# Patient Record
Sex: Female | Born: 1955 | Race: Black or African American | Hispanic: No | Smoking: Former smoker
Health system: Southern US, Community
[De-identification: ages and names within clinical notes are randomized; demographics above are authoritative.]

## PROBLEM LIST (undated history)

## (undated) DIAGNOSIS — M109 Gout, unspecified: Secondary | ICD-10-CM

## (undated) DIAGNOSIS — E7800 Pure hypercholesterolemia, unspecified: Secondary | ICD-10-CM

## (undated) DIAGNOSIS — I10 Essential (primary) hypertension: Secondary | ICD-10-CM

## (undated) DIAGNOSIS — M1711 Unilateral primary osteoarthritis, right knee: Secondary | ICD-10-CM

## (undated) DIAGNOSIS — N183 Chronic kidney disease, stage 3 unspecified: Secondary | ICD-10-CM

## (undated) DIAGNOSIS — M1712 Unilateral primary osteoarthritis, left knee: Secondary | ICD-10-CM

## (undated) DIAGNOSIS — A048 Other specified bacterial intestinal infections: Secondary | ICD-10-CM

## (undated) DIAGNOSIS — J449 Chronic obstructive pulmonary disease, unspecified: Secondary | ICD-10-CM

## (undated) DIAGNOSIS — G4733 Obstructive sleep apnea (adult) (pediatric): Secondary | ICD-10-CM

## (undated) DIAGNOSIS — M170 Bilateral primary osteoarthritis of knee: Secondary | ICD-10-CM

## (undated) DIAGNOSIS — I5042 Chronic combined systolic (congestive) and diastolic (congestive) heart failure: Secondary | ICD-10-CM

## (undated) DIAGNOSIS — I639 Cerebral infarction, unspecified: Secondary | ICD-10-CM

## (undated) DIAGNOSIS — Z87891 Personal history of nicotine dependence: Secondary | ICD-10-CM

## (undated) DIAGNOSIS — M069 Rheumatoid arthritis, unspecified: Secondary | ICD-10-CM

## (undated) DIAGNOSIS — I272 Pulmonary hypertension, unspecified: Secondary | ICD-10-CM

## (undated) DIAGNOSIS — J45909 Unspecified asthma, uncomplicated: Secondary | ICD-10-CM

## (undated) DIAGNOSIS — I4819 Other persistent atrial fibrillation: Secondary | ICD-10-CM

## (undated) DIAGNOSIS — I428 Other cardiomyopathies: Secondary | ICD-10-CM

## (undated) DIAGNOSIS — J189 Pneumonia, unspecified organism: Secondary | ICD-10-CM

## (undated) HISTORY — DX: Unilateral primary osteoarthritis, left knee: M17.12

## (undated) HISTORY — DX: Personal history of nicotine dependence: Z87.891

## (undated) HISTORY — DX: Pure hypercholesterolemia, unspecified: E78.00

## (undated) HISTORY — PX: DOPPLER ECHOCARDIOGRAPHY: SHX263

## (undated) HISTORY — PX: EYE SURGERY: SHX253

## (undated) HISTORY — DX: Bilateral primary osteoarthritis of knee: M17.0

## (undated) HISTORY — PX: OTHER SURGICAL HISTORY: SHX169

## (undated) HISTORY — DX: Unilateral primary osteoarthritis, right knee: M17.11

## (undated) HISTORY — PX: CORNEAL TRANSPLANT: SHX108

---

## 1980-10-11 HISTORY — PX: TUBAL LIGATION: SHX77

## 1999-01-29 ENCOUNTER — Ambulatory Visit (HOSPITAL_COMMUNITY): Admission: RE | Admit: 1999-01-29 | Discharge: 1999-01-29 | Payer: Self-pay | Admitting: Pulmonary Disease

## 1999-02-17 ENCOUNTER — Encounter: Admission: RE | Admit: 1999-02-17 | Discharge: 1999-02-17 | Payer: Self-pay | Admitting: Obstetrics & Gynecology

## 2002-04-25 ENCOUNTER — Emergency Department (HOSPITAL_COMMUNITY): Admission: EM | Admit: 2002-04-25 | Discharge: 2002-04-25 | Payer: Self-pay | Admitting: Emergency Medicine

## 2002-06-14 ENCOUNTER — Encounter: Admission: RE | Admit: 2002-06-14 | Discharge: 2002-06-14 | Payer: Self-pay | Admitting: Internal Medicine

## 2002-07-17 ENCOUNTER — Encounter: Admission: RE | Admit: 2002-07-17 | Discharge: 2002-07-17 | Payer: Self-pay | Admitting: Internal Medicine

## 2003-02-14 ENCOUNTER — Encounter: Admission: RE | Admit: 2003-02-14 | Discharge: 2003-02-14 | Payer: Self-pay | Admitting: Internal Medicine

## 2003-03-14 ENCOUNTER — Encounter: Admission: RE | Admit: 2003-03-14 | Discharge: 2003-03-14 | Payer: Self-pay | Admitting: Internal Medicine

## 2003-07-04 ENCOUNTER — Other Ambulatory Visit: Admission: RE | Admit: 2003-07-04 | Discharge: 2003-07-04 | Payer: Self-pay | Admitting: Gynecology

## 2003-09-12 ENCOUNTER — Encounter: Admission: RE | Admit: 2003-09-12 | Discharge: 2003-09-12 | Payer: Self-pay | Admitting: Internal Medicine

## 2003-09-17 ENCOUNTER — Encounter: Admission: RE | Admit: 2003-09-17 | Discharge: 2003-09-17 | Payer: Self-pay | Admitting: Internal Medicine

## 2003-10-01 ENCOUNTER — Encounter: Admission: RE | Admit: 2003-10-01 | Discharge: 2003-10-01 | Payer: Self-pay | Admitting: Internal Medicine

## 2003-10-09 ENCOUNTER — Encounter: Admission: RE | Admit: 2003-10-09 | Discharge: 2003-10-09 | Payer: Self-pay | Admitting: Internal Medicine

## 2004-12-01 ENCOUNTER — Ambulatory Visit: Payer: Self-pay | Admitting: Internal Medicine

## 2004-12-17 ENCOUNTER — Ambulatory Visit: Payer: Self-pay | Admitting: Internal Medicine

## 2004-12-17 ENCOUNTER — Ambulatory Visit (HOSPITAL_COMMUNITY): Admission: RE | Admit: 2004-12-17 | Discharge: 2004-12-17 | Payer: Self-pay | Admitting: Internal Medicine

## 2004-12-31 ENCOUNTER — Ambulatory Visit: Payer: Self-pay | Admitting: Internal Medicine

## 2005-01-01 ENCOUNTER — Ambulatory Visit (HOSPITAL_COMMUNITY): Admission: RE | Admit: 2005-01-01 | Discharge: 2005-01-01 | Payer: Self-pay | Admitting: Internal Medicine

## 2005-03-24 ENCOUNTER — Ambulatory Visit: Payer: Self-pay | Admitting: Internal Medicine

## 2005-04-06 ENCOUNTER — Other Ambulatory Visit: Admission: RE | Admit: 2005-04-06 | Discharge: 2005-04-06 | Payer: Self-pay | Admitting: Gynecology

## 2005-06-08 ENCOUNTER — Emergency Department (HOSPITAL_COMMUNITY): Admission: EM | Admit: 2005-06-08 | Discharge: 2005-06-08 | Payer: Self-pay | Admitting: Emergency Medicine

## 2005-07-21 ENCOUNTER — Emergency Department (HOSPITAL_COMMUNITY): Admission: EM | Admit: 2005-07-21 | Discharge: 2005-07-21 | Payer: Self-pay | Admitting: Emergency Medicine

## 2005-09-13 ENCOUNTER — Encounter: Admission: RE | Admit: 2005-09-13 | Discharge: 2005-09-13 | Payer: Self-pay | Admitting: Internal Medicine

## 2005-10-11 HISTORY — PX: ANGIOGRAM/LV (CONGENITAL): SHX6166

## 2005-11-04 ENCOUNTER — Ambulatory Visit (HOSPITAL_COMMUNITY): Admission: RE | Admit: 2005-11-04 | Discharge: 2005-11-04 | Payer: Self-pay | Admitting: Cardiology

## 2005-11-04 HISTORY — PX: CORONARY ANGIOPLASTY WITH STENT PLACEMENT: SHX49

## 2006-07-28 ENCOUNTER — Other Ambulatory Visit: Admission: RE | Admit: 2006-07-28 | Discharge: 2006-07-28 | Payer: Self-pay | Admitting: Gynecology

## 2006-10-17 DIAGNOSIS — N949 Unspecified condition associated with female genital organs and menstrual cycle: Secondary | ICD-10-CM | POA: Insufficient documentation

## 2006-10-17 DIAGNOSIS — I517 Cardiomegaly: Secondary | ICD-10-CM | POA: Insufficient documentation

## 2006-10-17 DIAGNOSIS — I10 Essential (primary) hypertension: Secondary | ICD-10-CM

## 2006-10-17 DIAGNOSIS — E785 Hyperlipidemia, unspecified: Secondary | ICD-10-CM | POA: Insufficient documentation

## 2006-10-20 ENCOUNTER — Ambulatory Visit (HOSPITAL_COMMUNITY): Admission: RE | Admit: 2006-10-20 | Discharge: 2006-10-20 | Payer: Self-pay | Admitting: Cardiology

## 2006-10-20 ENCOUNTER — Encounter (INDEPENDENT_AMBULATORY_CARE_PROVIDER_SITE_OTHER): Payer: Self-pay | Admitting: Cardiology

## 2006-12-25 ENCOUNTER — Emergency Department (HOSPITAL_COMMUNITY): Admission: EM | Admit: 2006-12-25 | Discharge: 2006-12-25 | Payer: Self-pay | Admitting: *Deleted

## 2007-03-11 ENCOUNTER — Emergency Department (HOSPITAL_COMMUNITY): Admission: EM | Admit: 2007-03-11 | Discharge: 2007-03-11 | Payer: Self-pay | Admitting: Emergency Medicine

## 2007-11-27 ENCOUNTER — Other Ambulatory Visit: Admission: RE | Admit: 2007-11-27 | Discharge: 2007-11-27 | Payer: Self-pay | Admitting: Gynecology

## 2008-06-09 ENCOUNTER — Emergency Department (HOSPITAL_COMMUNITY): Admission: EM | Admit: 2008-06-09 | Discharge: 2008-06-09 | Payer: Self-pay | Admitting: Emergency Medicine

## 2009-03-13 ENCOUNTER — Emergency Department (HOSPITAL_COMMUNITY): Admission: EM | Admit: 2009-03-13 | Discharge: 2009-03-13 | Payer: Self-pay | Admitting: Emergency Medicine

## 2009-06-24 ENCOUNTER — Ambulatory Visit: Payer: Self-pay | Admitting: Vascular Surgery

## 2009-06-24 ENCOUNTER — Ambulatory Visit: Payer: Self-pay | Admitting: Internal Medicine

## 2009-06-24 ENCOUNTER — Inpatient Hospital Stay (HOSPITAL_COMMUNITY): Admission: EM | Admit: 2009-06-24 | Discharge: 2009-06-27 | Payer: Self-pay | Admitting: Emergency Medicine

## 2009-06-24 ENCOUNTER — Encounter (INDEPENDENT_AMBULATORY_CARE_PROVIDER_SITE_OTHER): Payer: Self-pay | Admitting: Cardiovascular Disease

## 2009-06-25 ENCOUNTER — Encounter (INDEPENDENT_AMBULATORY_CARE_PROVIDER_SITE_OTHER): Payer: Self-pay | Admitting: Cardiovascular Disease

## 2009-06-26 ENCOUNTER — Ambulatory Visit: Payer: Self-pay | Admitting: Pulmonary Disease

## 2009-06-27 ENCOUNTER — Encounter: Payer: Self-pay | Admitting: Internal Medicine

## 2009-07-02 ENCOUNTER — Encounter: Payer: Self-pay | Admitting: Internal Medicine

## 2009-10-02 ENCOUNTER — Inpatient Hospital Stay (HOSPITAL_COMMUNITY): Admission: EM | Admit: 2009-10-02 | Discharge: 2009-10-03 | Payer: Self-pay | Admitting: Emergency Medicine

## 2009-10-11 HISTORY — PX: OTHER SURGICAL HISTORY: SHX169

## 2009-11-16 ENCOUNTER — Inpatient Hospital Stay (HOSPITAL_COMMUNITY): Admission: EM | Admit: 2009-11-16 | Discharge: 2009-11-18 | Payer: Self-pay | Admitting: Emergency Medicine

## 2009-12-01 ENCOUNTER — Encounter: Payer: Self-pay | Admitting: Internal Medicine

## 2009-12-09 ENCOUNTER — Encounter: Payer: Self-pay | Admitting: Internal Medicine

## 2009-12-11 ENCOUNTER — Encounter: Payer: Self-pay | Admitting: Internal Medicine

## 2009-12-19 ENCOUNTER — Encounter: Payer: Self-pay | Admitting: Internal Medicine

## 2009-12-26 ENCOUNTER — Encounter: Payer: Self-pay | Admitting: Internal Medicine

## 2010-01-09 DIAGNOSIS — I480 Paroxysmal atrial fibrillation: Secondary | ICD-10-CM

## 2010-01-16 ENCOUNTER — Encounter: Payer: Self-pay | Admitting: Internal Medicine

## 2010-02-03 ENCOUNTER — Encounter (INDEPENDENT_AMBULATORY_CARE_PROVIDER_SITE_OTHER): Payer: Self-pay | Admitting: *Deleted

## 2010-10-14 ENCOUNTER — Emergency Department (HOSPITAL_COMMUNITY)
Admission: EM | Admit: 2010-10-14 | Discharge: 2010-10-15 | Payer: Self-pay | Source: Home / Self Care | Admitting: Emergency Medicine

## 2010-10-14 LAB — BASIC METABOLIC PANEL
BUN: 10 mg/dL (ref 6–23)
CO2: 32 mEq/L (ref 19–32)
Calcium: 9 mg/dL (ref 8.4–10.5)
Chloride: 97 mEq/L (ref 96–112)
Creatinine, Ser: 1 mg/dL (ref 0.4–1.2)
GFR calc Af Amer: >60 mL/min (ref >60)
GFR calc non Af Amer: 58 mL/min — ABNORMAL LOW (ref >60)
Glucose, Bld: 135 mg/dL — ABNORMAL HIGH (ref 70–99)
Potassium: 3.1 mEq/L — ABNORMAL LOW (ref 3.5–5.1)
Sodium: 142 mEq/L (ref 135–145)

## 2010-10-14 LAB — CBC
HCT: 36.6 % (ref 36.0–46.0)
Hemoglobin: 11.7 g/dL — ABNORMAL LOW (ref 12.0–15.0)
MCH: 28.3 pg (ref 26.0–34.0)
MCHC: 32 g/dL (ref 30.0–36.0)
MCV: 88.4 fL (ref 78.0–100.0)
Platelets: 265 10*3/uL (ref 150–400)
RBC: 4.14 MIL/uL (ref 3.87–5.11)
RDW: 14.5 % (ref 11.5–15.5)
WBC: 8.6 10*3/uL (ref 4.0–10.5)

## 2010-10-14 LAB — DIFFERENTIAL
Basophils Absolute: 0 10*3/uL (ref 0.0–0.1)
Basophils Relative: 0 % (ref 0–1)
Eosinophils Absolute: 0.1 10*3/uL (ref 0.0–0.7)
Eosinophils Relative: 1 % (ref 0–5)
Lymphocytes Relative: 18 % (ref 12–46)
Lymphs Abs: 1.5 10*3/uL (ref 0.7–4.0)
Monocytes Absolute: 0.7 10*3/uL (ref 0.1–1.0)
Monocytes Relative: 8 % (ref 3–12)
Neutro Abs: 6.3 10*3/uL (ref 1.7–7.7)
Neutrophils Relative %: 73 % (ref 43–77)

## 2010-10-15 ENCOUNTER — Other Ambulatory Visit: Payer: Self-pay | Admitting: Emergency Medicine

## 2010-10-15 LAB — SEDIMENTATION RATE: Sed Rate: 25 mm/hr — ABNORMAL HIGH (ref 0–22)

## 2010-10-18 ENCOUNTER — Emergency Department (HOSPITAL_COMMUNITY)
Admission: EM | Admit: 2010-10-18 | Discharge: 2010-10-18 | Payer: Self-pay | Source: Home / Self Care | Admitting: Emergency Medicine

## 2010-10-26 LAB — CBC
HCT: 36.6 % (ref 36.0–46.0)
Hemoglobin: 11.7 g/dL — ABNORMAL LOW (ref 12.0–15.0)
MCH: 28.1 pg (ref 26.0–34.0)
MCHC: 32 g/dL (ref 30.0–36.0)
MCV: 88 fL (ref 78.0–100.0)
Platelets: 230 10*3/uL (ref 150–400)
RBC: 4.16 MIL/uL (ref 3.87–5.11)
RDW: 14.3 % (ref 11.5–15.5)
WBC: 8.9 10*3/uL (ref 4.0–10.5)

## 2010-10-26 LAB — URIC ACID: Uric Acid, Serum: 9.4 mg/dL — ABNORMAL HIGH (ref 2.4–7.0)

## 2010-10-26 LAB — DIFFERENTIAL
Basophils Absolute: 0 10*3/uL (ref 0.0–0.1)
Basophils Relative: 0 % (ref 0–1)
Eosinophils Absolute: 0.1 K/uL (ref 0.0–0.7)
Eosinophils Relative: 2 % (ref 0–5)
Lymphocytes Relative: 17 % (ref 12–46)
Lymphs Abs: 1.5 10*3/uL (ref 0.7–4.0)
Monocytes Absolute: 0.6 10*3/uL (ref 0.1–1.0)
Monocytes Relative: 7 % (ref 3–12)
Neutro Abs: 6.6 10*3/uL (ref 1.7–7.7)
Neutrophils Relative %: 75 % (ref 43–77)

## 2010-11-12 NOTE — Letter (Signed)
Summary: [facility] Office Note Addendum   [facility] Office Note Addendum   Imported By: Roderic Ovens 02/02/2010 13:35:23  _____________________________________________________________________  External Attachment:    Type:   Image     Comment:   External Document

## 2010-11-12 NOTE — Letter (Signed)
Summary: Appointment - Missed  [facility], Main Office  [address]   [place], [place] [address]   Phone: [phone]  Fax: [phone]     February 03, 2010 MRN: [id number]   Paige Marshall [address] [place], [place]  [address]   Dear Ms. Nordgren,  Our records indicate you missed your appointment on 01/09/10  with Dr. Ladona Ridgel.It is very important that we reach you to reschedule this appointment. We look forward to participating in your health care needs. Please contact us at the number listed above at your earliest convenience to reschedule this appointment.     Sincerely,   Ruel Favors Scheduling Team

## 2010-11-12 NOTE — Letter (Signed)
Summary: [facility] Office Note   [facility] Office Note   Imported By: Roderic Ovens 02/02/2010 13:53:32  _____________________________________________________________________  External Attachment:    Type:   Image     Comment:   External Document

## 2010-11-12 NOTE — Progress Notes (Signed)
Summary: Med List   Med List   Imported By: Roderic Ovens 02/02/2010 13:36:48  _____________________________________________________________________  External Attachment:    Type:   Image     Comment:   External Document

## 2010-11-12 NOTE — Letter (Signed)
Summary: [facility] Office Note   [facility] Office Note   Imported By: Roderic Ovens 02/02/2010 13:25:29  _____________________________________________________________________  External Attachment:    Type:   Image     Comment:   External Document

## 2010-11-12 NOTE — Letter (Signed)
Summary: [facility] Office Note   [facility] Office Note   Imported By: Roderic Ovens 02/02/2010 13:29:31  _____________________________________________________________________  External Attachment:    Type:   Image     Comment:   External Document

## 2010-12-30 LAB — BASIC METABOLIC PANEL WITH GFR
BUN: 24 mg/dL — ABNORMAL HIGH (ref 6–23)
BUN: 24 mg/dL — ABNORMAL HIGH (ref 6–23)
BUN: 25 mg/dL — ABNORMAL HIGH (ref 6–23)
CO2: 30 meq/L (ref 19–32)
CO2: 32 meq/L (ref 19–32)
CO2: 32 meq/L (ref 19–32)
Calcium: 8.8 mg/dL (ref 8.4–10.5)
Calcium: 8.8 mg/dL (ref 8.4–10.5)
Calcium: 8.9 mg/dL (ref 8.4–10.5)
Chloride: 96 meq/L (ref 96–112)
Chloride: 97 meq/L (ref 96–112)
Chloride: 99 meq/L (ref 96–112)
Creatinine, Ser: 0.95 mg/dL (ref 0.4–1.2)
Creatinine, Ser: 1 mg/dL (ref 0.4–1.2)
Creatinine, Ser: 1.24 mg/dL — ABNORMAL HIGH (ref 0.4–1.2)
GFR calc non Af Amer: 46 mL/min — ABNORMAL LOW
GFR calc non Af Amer: 58 mL/min — ABNORMAL LOW
GFR calc non Af Amer: >60 mL/min
Glucose, Bld: 115 mg/dL — ABNORMAL HIGH (ref 70–99)
Glucose, Bld: 121 mg/dL — ABNORMAL HIGH (ref 70–99)
Glucose, Bld: 98 mg/dL (ref 70–99)
Potassium: 2.8 meq/L — ABNORMAL LOW (ref 3.5–5.1)
Potassium: 4 meq/L (ref 3.5–5.1)
Potassium: 4 meq/L (ref 3.5–5.1)
Sodium: 137 meq/L (ref 135–145)
Sodium: 139 meq/L (ref 135–145)
Sodium: 139 meq/L (ref 135–145)

## 2010-12-30 LAB — URINALYSIS, MICROSCOPIC ONLY
Glucose, UA: NEGATIVE mg/dL
Hgb urine dipstick: NEGATIVE
Ketones, ur: NEGATIVE mg/dL
Leukocytes, UA: NEGATIVE
Nitrite: NEGATIVE
Protein, ur: NEGATIVE mg/dL
Specific Gravity, Urine: 1.019 (ref 1.005–1.030)
Urobilinogen, UA: 1 mg/dL (ref 0.0–1.0)
pH: 5 (ref 5.0–8.0)

## 2010-12-30 LAB — BASIC METABOLIC PANEL
Calcium: 9.4 mg/dL (ref 8.4–10.5)
GFR calc Af Amer: 59 mL/min — ABNORMAL LOW (ref >60)
GFR calc non Af Amer: 49 mL/min — ABNORMAL LOW (ref >60)
Sodium: 143 mEq/L (ref 135–145)

## 2010-12-30 LAB — BRAIN NATRIURETIC PEPTIDE: Pro B Natriuretic peptide (BNP): 218 pg/mL — ABNORMAL HIGH (ref 0.0–100.0)

## 2010-12-30 LAB — COMPREHENSIVE METABOLIC PANEL WITH GFR
ALT: 17 U/L (ref 0–35)
AST: 19 U/L (ref 0–37)
Albumin: 3.7 g/dL (ref 3.5–5.2)
Alkaline Phosphatase: 49 U/L (ref 39–117)
BUN: 27 mg/dL — ABNORMAL HIGH (ref 6–23)
CO2: 31 meq/L (ref 19–32)
Calcium: 9.3 mg/dL (ref 8.4–10.5)
Chloride: 95 meq/L — ABNORMAL LOW (ref 96–112)
Creatinine, Ser: 1.34 mg/dL — ABNORMAL HIGH (ref 0.4–1.2)
GFR calc non Af Amer: 42 mL/min — ABNORMAL LOW
Glucose, Bld: 160 mg/dL — ABNORMAL HIGH (ref 70–99)
Potassium: 3.1 meq/L — ABNORMAL LOW (ref 3.5–5.1)
Sodium: 138 meq/L (ref 135–145)
Total Bilirubin: 0.5 mg/dL (ref 0.3–1.2)
Total Protein: 6.8 g/dL (ref 6.0–8.3)

## 2010-12-30 LAB — CBC
HCT: 30.9 % — ABNORMAL LOW (ref 36.0–46.0)
HCT: 33.3 % — ABNORMAL LOW (ref 36.0–46.0)
HCT: 37.2 % (ref 36.0–46.0)
Hemoglobin: 10.6 g/dL — ABNORMAL LOW (ref 12.0–15.0)
Hemoglobin: 11.4 g/dL — ABNORMAL LOW (ref 12.0–15.0)
Hemoglobin: 15.2 g/dL — ABNORMAL HIGH (ref 12.0–15.0)
MCHC: 34.2 g/dL (ref 30.0–36.0)
MCHC: 34.2 g/dL (ref 30.0–36.0)
MCV: 86.9 fL (ref 78.0–100.0)
MCV: 88.4 fL (ref 78.0–100.0)
Platelets: 189 K/uL (ref 150–400)
Platelets: 208 K/uL (ref 150–400)
Platelets: 272 10*3/uL (ref 150–400)
RBC: 3.55 MIL/uL — ABNORMAL LOW (ref 3.87–5.11)
RBC: 3.77 MIL/uL — ABNORMAL LOW (ref 3.87–5.11)
RBC: 4.23 MIL/uL (ref 3.87–5.11)
RBC: 5.12 MIL/uL — ABNORMAL HIGH (ref 3.87–5.11)
RDW: 16.6 % — ABNORMAL HIGH (ref 11.5–15.5)
RDW: 17.2 % — ABNORMAL HIGH (ref 11.5–15.5)
WBC: 12.3 10*3/uL — ABNORMAL HIGH (ref 4.0–10.5)
WBC: 14.5 10*3/uL — ABNORMAL HIGH (ref 4.0–10.5)
WBC: 7.4 K/uL (ref 4.0–10.5)
WBC: 7.7 K/uL (ref 4.0–10.5)

## 2010-12-30 LAB — RAPID URINE DRUG SCREEN, HOSP PERFORMED
Amphetamines: NOT DETECTED
Benzodiazepines: NOT DETECTED

## 2010-12-30 LAB — CARDIAC PANEL(CRET KIN+CKTOT+MB+TROPI)
CK, MB: 1.5 ng/mL (ref 0.3–4.0)
CK, MB: 1.5 ng/mL (ref 0.3–4.0)
Relative Index: INVALID (ref 0.0–2.5)
Relative Index: INVALID (ref 0.0–2.5)
Total CK: 56 U/L (ref 7–177)
Total CK: 60 U/L (ref 7–177)

## 2010-12-30 LAB — POCT CARDIAC MARKERS
CKMB, poc: 1.8 ng/mL (ref 1.0–8.0)
Myoglobin, poc: 91.7 ng/mL (ref 12–200)
Troponin i, poc: <0.05 ng/mL (ref 0.00–0.09)

## 2010-12-30 LAB — HEPARIN LEVEL (UNFRACTIONATED)

## 2010-12-30 LAB — DIFFERENTIAL
Basophils Absolute: 0 10*3/uL (ref 0.0–0.1)
Basophils Relative: 0 % (ref 0–1)
Lymphocytes Relative: 15 % (ref 12–46)
Neutro Abs: 9.7 10*3/uL — ABNORMAL HIGH (ref 1.7–7.7)
Neutrophils Relative %: 79 % — ABNORMAL HIGH (ref 43–77)

## 2010-12-30 LAB — CK TOTAL AND CKMB (NOT AT ARMC): CK, MB: 2.1 ng/mL (ref 0.3–4.0)

## 2010-12-30 LAB — TSH: TSH: 1.043 u[IU]/mL (ref 0.350–4.500)

## 2010-12-30 LAB — MAGNESIUM: Magnesium: 2.7 mg/dL — ABNORMAL HIGH (ref 1.5–2.5)

## 2011-01-11 LAB — CARDIAC PANEL(CRET KIN+CKTOT+MB+TROPI)
CK, MB: 0.9 ng/mL (ref 0.3–4.0)
Relative Index: INVALID (ref 0.0–2.5)
Relative Index: INVALID (ref 0.0–2.5)
Total CK: 80 U/L (ref 7–177)
Total CK: 83 U/L (ref 7–177)
Troponin I: 0.01 ng/mL (ref 0.00–0.06)
Troponin I: 0.01 ng/mL (ref 0.00–0.06)

## 2011-01-11 LAB — CBC
MCHC: 34.9 g/dL (ref 30.0–36.0)
MCV: 85.7 fL (ref 78.0–100.0)
Platelets: 201 10*3/uL (ref 150–400)
RDW: 16.3 % — ABNORMAL HIGH (ref 11.5–15.5)
WBC: 4.7 10*3/uL (ref 4.0–10.5)

## 2011-01-11 LAB — BASIC METABOLIC PANEL
BUN: 20 mg/dL (ref 6–23)
BUN: 28 mg/dL — ABNORMAL HIGH (ref 6–23)
CO2: 29 mEq/L (ref 19–32)
CO2: 31 mEq/L (ref 19–32)
Calcium: 8.9 mg/dL (ref 8.4–10.5)
Chloride: 97 mEq/L (ref 96–112)
Chloride: 98 mEq/L (ref 96–112)
Creatinine, Ser: 0.72 mg/dL (ref 0.4–1.2)
Creatinine, Ser: 1.03 mg/dL (ref 0.4–1.2)
GFR calc Af Amer: >60 mL/min (ref >60)
GFR calc non Af Amer: 56 mL/min — ABNORMAL LOW (ref >60)
Glucose, Bld: 113 mg/dL — ABNORMAL HIGH (ref 70–99)
Glucose, Bld: 211 mg/dL — ABNORMAL HIGH (ref 70–99)
Potassium: 3.4 mEq/L — ABNORMAL LOW (ref 3.5–5.1)
Sodium: 138 mEq/L (ref 135–145)

## 2011-01-11 LAB — HEMOGLOBIN A1C
Hgb A1c MFr Bld: 5.6 % (ref 4.6–6.1)
Mean Plasma Glucose: 114 mg/dL

## 2011-01-11 LAB — BRAIN NATRIURETIC PEPTIDE: Pro B Natriuretic peptide (BNP): 53 pg/mL (ref 0.0–100.0)

## 2011-01-11 LAB — TROPONIN I: Troponin I: 0.01 ng/mL (ref 0.00–0.06)

## 2011-01-11 LAB — DIFFERENTIAL
Basophils Absolute: 0 10*3/uL (ref 0.0–0.1)
Basophils Relative: 0 % (ref 0–1)
Eosinophils Absolute: 0.1 10*3/uL (ref 0.0–0.7)
Neutrophils Relative %: 66 % (ref 43–77)

## 2011-01-11 LAB — MAGNESIUM: Magnesium: 2.1 mg/dL (ref 1.5–2.5)

## 2011-01-11 LAB — POCT CARDIAC MARKERS: Myoglobin, poc: 40.2 ng/mL (ref 12–200)

## 2011-01-11 LAB — LIPID PANEL
Cholesterol: 191 mg/dL (ref 0–200)
LDL Cholesterol: 94 mg/dL (ref 0–99)
Total CHOL/HDL Ratio: 3 RATIO

## 2011-01-11 LAB — CK TOTAL AND CKMB (NOT AT ARMC)
CK, MB: 0.9 ng/mL (ref 0.3–4.0)
Relative Index: INVALID (ref 0.0–2.5)
Total CK: 89 U/L (ref 7–177)

## 2011-01-15 LAB — CBC
HCT: 32 % — ABNORMAL LOW (ref 36.0–46.0)
HCT: 32.3 % — ABNORMAL LOW (ref 36.0–46.0)
Hemoglobin: 9.9 g/dL — ABNORMAL LOW (ref 12.0–15.0)
MCHC: 33.3 g/dL (ref 30.0–36.0)
MCHC: 33.8 g/dL (ref 30.0–36.0)
MCHC: 33.2 g/dL (ref 30.0–36.0)
MCHC: 34.6 g/dL (ref 30.0–36.0)
MCV: 86.4 fL (ref 78.0–100.0)
MCV: 86.4 fL (ref 78.0–100.0)
MCV: 85.3 fL (ref 78.0–100.0)
Platelets: 224 10*3/uL (ref 150–400)
Platelets: 254 10*3/uL (ref 150–400)
RBC: 3.33 MIL/uL — ABNORMAL LOW (ref 3.87–5.11)
RBC: 3.42 MIL/uL — ABNORMAL LOW (ref 3.87–5.11)
RDW: 16.5 % — ABNORMAL HIGH (ref 11.5–15.5)
RDW: 17.1 % — ABNORMAL HIGH (ref 11.5–15.5)
RDW: 16 % — ABNORMAL HIGH (ref 11.5–15.5)
RDW: 16.4 % — ABNORMAL HIGH (ref 11.5–15.5)

## 2011-01-15 LAB — CLOTEST (H. PYLORI), BIOPSY: Helicobacter screen: POSITIVE — AB

## 2011-01-15 LAB — COMPREHENSIVE METABOLIC PANEL
Albumin: 3.8 g/dL (ref 3.5–5.2)
BUN: 11 mg/dL (ref 6–23)
Calcium: 8.7 mg/dL (ref 8.4–10.5)
Creatinine, Ser: 0.93 mg/dL (ref 0.4–1.2)
Total Protein: 7 g/dL (ref 6.0–8.3)

## 2011-01-15 LAB — URINALYSIS, ROUTINE W REFLEX MICROSCOPIC
Hgb urine dipstick: NEGATIVE
Nitrite: NEGATIVE
Specific Gravity, Urine: 1.021 (ref 1.005–1.030)
pH: 5 (ref 5.0–8.0)

## 2011-01-15 LAB — PROTIME-INR: INR: 1 (ref 0.00–1.49)

## 2011-01-15 LAB — BASIC METABOLIC PANEL
BUN: 16 mg/dL (ref 6–23)
BUN: 13 mg/dL (ref 6–23)
Creatinine, Ser: 0.96 mg/dL (ref 0.4–1.2)
GFR calc non Af Amer: >60 mL/min (ref >60)
GFR calc non Af Amer: >60 mL/min (ref >60)
Glucose, Bld: 153 mg/dL — ABNORMAL HIGH (ref 70–99)
Glucose, Bld: 133 mg/dL — ABNORMAL HIGH (ref 70–99)
Potassium: 4.1 mEq/L (ref 3.5–5.1)

## 2011-01-15 LAB — URINE MICROSCOPIC-ADD ON

## 2011-01-15 LAB — D-DIMER, QUANTITATIVE: D-Dimer, Quant: <0.22 ug/mL-FEU (ref 0.00–0.48)

## 2011-01-15 LAB — HEMOGLOBIN A1C: Hgb A1c MFr Bld: 5.3 % (ref 4.6–6.1)

## 2011-01-15 LAB — IRON AND TIBC
TIBC: 322 ug/dL (ref 250–470)
UIBC: 265 ug/dL

## 2011-01-15 LAB — HEPARIN LEVEL (UNFRACTIONATED)
Heparin Unfractionated: 0.4 IU/mL (ref 0.30–0.70)
Heparin Unfractionated: 0.2 IU/mL — ABNORMAL LOW (ref 0.30–0.70)
Heparin Unfractionated: 0.39 IU/mL (ref 0.30–0.70)

## 2011-01-15 LAB — DIFFERENTIAL
Lymphocytes Relative: 21 % (ref 12–46)
Monocytes Absolute: 0.4 10*3/uL (ref 0.1–1.0)
Monocytes Relative: 6 % (ref 3–12)
Neutro Abs: 4.6 10*3/uL (ref 1.7–7.7)
Neutrophils Relative %: 71 % (ref 43–77)

## 2011-01-15 LAB — POCT CARDIAC MARKERS
CKMB, poc: <1 ng/mL — ABNORMAL LOW (ref 1.0–8.0)
Myoglobin, poc: 69.8 ng/mL (ref 12–200)
Troponin i, poc: <0.05 ng/mL (ref 0.00–0.09)
Troponin i, poc: <0.05 ng/mL (ref 0.00–0.09)

## 2011-01-15 LAB — FERRITIN: Ferritin: 246 ng/mL (ref 10–291)

## 2011-01-15 LAB — URINE CULTURE: Culture: NO GROWTH

## 2011-01-15 LAB — TSH: TSH: 0.741 u[IU]/mL (ref 0.350–4.500)

## 2011-01-15 LAB — VITAMIN B12: Vitamin B-12: 919 pg/mL — ABNORMAL HIGH (ref 211–911)

## 2011-01-15 LAB — APTT: aPTT: 85 seconds — ABNORMAL HIGH (ref 24–37)

## 2011-02-26 NOTE — Cardiovascular Report (Signed)
Paige Marshall, Paige Marshall NO.:  [id number]   MEDICAL RECORD NO.:  [id number]          PATIENT TYPE:  OIB   LOCATION:  2899                         FACILITY:  [facility]   PHYSICIAN:  Cristy Hilts. Jacinto Halim, MD       DATE OF BIRTH:  [birth year]   DATE OF PROCEDURE:  11/04/2005  DATE OF DISCHARGE:                              CARDIAC CATHETERIZATION   REFERRING PHYSICIAN:  Robyn N. Allyne Gee, M.D.   PROCEDURE PERFORMED:  1.  Left ventriculography.  2.  Saphenous vein graft coronary arteriography.  3.  Right heart catheterization.   INDICATIONS:  Paige Marshall is a 55 year old African American female with  history of difficult to control hypertension.  She had been complaining of  increasing shortness of breath.  She had an echocardiogram that had an EF of  around 22 to 30%.  Given her uncontrolled hypertension, and dilated  cardiomyopathy, she was brought to the cardiac catheterization lab to  evaluate for the cause of her cardiomyopathy.  Abdominal aortogram was  performed to evaluate for renal artery stenosis.   HEMODYNAMIC DATA:  The left ventricular pressure was 127/11 with the end  diastolic pressure of 19 mmHg.  The aortic pressure was 123/74 with a mean of 96 mmHg.   There was no pressure gradient across the aortic valve.   ANGIOGRAPHIC DATA:  LEFT VENTRICLE:  Left ventricular systolic function was  mild to moderately depressed with ejection fraction of 40%.  There was  moderate global hypokinesis.  The left ventricle was moderately dilated.  There was no significant mitral regurgitation appreciated, but however does  not well determine secondary to poor visualization.  (Correlation with  pulmonary capillary wedge, no evidence of significant MR).   RIGHT CORONARY:  Right coronary artery was seen as a dominant vessel.  It  only has bifurcation to PDA, PLA. It is normal.   LEFT MAIN:  Left main artery is a large caliber vessel and appears normal.   CIRCUMFLEX:  Circumflex  is a large caliber vessel and gives origin to  moderate size AV groove branch and continues to a large obtuse marginal 1.  It is normal.   LAD:  LAD is a large caliber vessels and gives origin to large diagonal 1  and moderate size  to diagonal 2.  It is wraps around the apex. It is  normal.   ABDOMINAL AORTOGRAM:  Abdominal aortogram reveals two renal arteries, one on  either side.  They are widely patent.  Diagonally EF bifurcation was widely  patent.   RIGHT HEART CATHETERIZATION HEMODYNAMIC DATA:  RA pressures were 19/17 with  a mean of 16 mmHg.   RV pressure of 48/14 with an end diastolic pressure of 17 mmHg.   PA pressure is 49/23 with a mean of 35 mmHg.   Pulmonary capillary wedge 35/32 with a mean of 32 mmHg.   PA saturation 60%.  FA saturation 90%.  Cardiac output was 5.1 with a  cardiac index of 2.1 by FICK.   IMPRESSION:  1.  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  2.  Moderate pulmonary hypertension secondary to morbid obesity and      cardiomyopathy contributing to this.   RECOMMENDATIONS:  Continue aggressive risk modification with weight loss,  aggressive control of hypertension and hyperlipidemia as indicated.   TECHNIQUE OF THE PROCEDURE:  Usual sterile precautions using a 6 French left  femoral arterial access and a 7 French right femoral  venous access, left  and right heart catheterization was respectively performed through these  sheaths.  A balloon tip Swan-Ganz catheter with a branch to the venous  sheath into the pulmonary artery.  Pulmonary capillary wedge was easily  obtained.  Right heart hemodynamics was carefully obtained.  The catheter  was then pulled apart in usual fashion.   A 6 [place] multipurpose B2 catheter was advanced into the ascending aorta  over a 0.025-inch J wire.  The catheter was gently advanced into the left  ventricle.  Left ventricular pressures  were monitored.  Hand contrast  injection of the left ventricle is performed.  Both in LAO and RAO  projection.  The catheter was flushed with saline and pulled back into the  ascending aorta and pressure gradient across the aortic valve was monitored.  The right coronary arteries selectively cannulated, angiography was  performed.  The catheter was pulled back into the abdominal aorta.  Abdominal aortogram was performed.  Then the 6 [place] Judkins JL4 diagnostic  catheter was utilized and engaged into left and right main coronary artery  angiography was performed.  Then the catheter was pulled out of body  in  usual fashion.  Right femoral angiography was performed to the arterial  access sheath  and the access was closed with star closure with excellent  hemostasis.   The patient tolerated the procedure well.  No complications noted.  A total  of 110 cc of contrast was utilized for diagnostic angiography.      Cristy Hilts. Jacinto Halim, MD  Electronically Signed     JRG/MEDQ  D:  11/04/2005  T:  11/04/2005  Job:  [id number]   cc:   Candyce Churn. Allyne Gee, M.D.  Fax: [phone]

## 2011-06-23 ENCOUNTER — Emergency Department (HOSPITAL_COMMUNITY): Payer: BC Managed Care – PPO

## 2011-06-23 ENCOUNTER — Emergency Department (HOSPITAL_COMMUNITY)
Admission: EM | Admit: 2011-06-23 | Discharge: 2011-06-23 | Disposition: A | Discharge status: Home / Self Care | Payer: BC Managed Care – PPO | Attending: Emergency Medicine | Admitting: Emergency Medicine

## 2011-06-23 DIAGNOSIS — M25429 Effusion, unspecified elbow: Secondary | ICD-10-CM | POA: Insufficient documentation

## 2011-06-23 DIAGNOSIS — M25529 Pain in unspecified elbow: Secondary | ICD-10-CM | POA: Insufficient documentation

## 2011-06-23 DIAGNOSIS — E669 Obesity, unspecified: Secondary | ICD-10-CM | POA: Insufficient documentation

## 2011-06-23 DIAGNOSIS — R262 Difficulty in walking, not elsewhere classified: Secondary | ICD-10-CM | POA: Insufficient documentation

## 2011-06-23 DIAGNOSIS — Z862 Personal history of diseases of the blood and blood-forming organs and certain disorders involving the immune mechanism: Secondary | ICD-10-CM | POA: Insufficient documentation

## 2011-06-23 DIAGNOSIS — Z79899 Other long term (current) drug therapy: Secondary | ICD-10-CM | POA: Insufficient documentation

## 2011-06-23 DIAGNOSIS — Z7982 Long term (current) use of aspirin: Secondary | ICD-10-CM | POA: Insufficient documentation

## 2011-06-23 DIAGNOSIS — M25569 Pain in unspecified knee: Secondary | ICD-10-CM | POA: Insufficient documentation

## 2011-06-23 DIAGNOSIS — I509 Heart failure, unspecified: Secondary | ICD-10-CM | POA: Insufficient documentation

## 2011-06-23 DIAGNOSIS — Z8639 Personal history of other endocrine, nutritional and metabolic disease: Secondary | ICD-10-CM | POA: Insufficient documentation

## 2011-06-23 DIAGNOSIS — I4891 Unspecified atrial fibrillation: Secondary | ICD-10-CM | POA: Insufficient documentation

## 2011-10-12 HISTORY — PX: OTHER SURGICAL HISTORY: SHX169

## 2011-12-08 ENCOUNTER — Emergency Department (HOSPITAL_COMMUNITY)
Admission: EM | Admit: 2011-12-08 | Discharge: 2011-12-08 | Disposition: A | Discharge status: Home / Self Care | Payer: BC Managed Care – PPO | Source: Home / Self Care | Attending: Emergency Medicine | Admitting: Emergency Medicine

## 2011-12-08 ENCOUNTER — Emergency Department (INDEPENDENT_AMBULATORY_CARE_PROVIDER_SITE_OTHER): Payer: BC Managed Care – PPO

## 2011-12-08 ENCOUNTER — Encounter (HOSPITAL_COMMUNITY): Payer: Self-pay | Admitting: *Deleted

## 2011-12-08 DIAGNOSIS — J40 Bronchitis, not specified as acute or chronic: Secondary | ICD-10-CM

## 2011-12-08 DIAGNOSIS — J45909 Unspecified asthma, uncomplicated: Secondary | ICD-10-CM

## 2011-12-08 HISTORY — DX: Essential (primary) hypertension: I10

## 2011-12-08 MED ORDER — ALBUTEROL SULFATE (5 MG/ML) 0.5% IN NEBU
INHALATION_SOLUTION | RESPIRATORY_TRACT | Status: AC
  Filled 2011-12-08: qty 1

## 2011-12-08 MED ORDER — HYDROCOD POLST-CHLORPHEN POLST 10-8 MG/5ML PO LQCR: 5.0000 mL | Freq: Two times a day (BID) | ORAL | Status: DC | PRN

## 2011-12-08 MED ORDER — ALBUTEROL SULFATE (5 MG/ML) 0.5% IN NEBU
5.0000 mg | INHALATION_SOLUTION | Freq: Once | RESPIRATORY_TRACT | Status: AC
  Administered 2011-12-08: 5 mg via RESPIRATORY_TRACT

## 2011-12-08 MED ORDER — METHYLPREDNISOLONE SODIUM SUCC 125 MG IJ SOLR
INTRAMUSCULAR | Status: AC
  Filled 2011-12-08: qty 2

## 2011-12-08 MED ORDER — IPRATROPIUM BROMIDE 0.02 % IN SOLN
0.5000 mg | Freq: Once | RESPIRATORY_TRACT | Status: AC
  Administered 2011-12-08: 0.5 mg via RESPIRATORY_TRACT

## 2011-12-08 MED ORDER — PREDNISONE 5 MG PO KIT: 1.0000 | PACK | Freq: Every day | ORAL | Status: DC

## 2011-12-08 MED ORDER — DOXYCYCLINE HYCLATE 100 MG PO TABS: 100.0000 mg | ORAL_TABLET | Freq: Two times a day (BID) | ORAL | Status: AC

## 2011-12-08 MED ORDER — METHYLPREDNISOLONE SODIUM SUCC 125 MG IJ SOLR
125.0000 mg | Freq: Once | INTRAMUSCULAR | Status: AC
  Administered 2011-12-08: 125 mg via INTRAMUSCULAR

## 2011-12-08 NOTE — ED Notes (Signed)
Pt states she's had a cough for 2 weeks, now with green mucus production.  Pt has hx of CHF, states she is sob and chest feels tight, but states this is not CHF, that she knows those symptoms.  She states she just needs some antibiotics.  Also states she has sinus congestion and has green nasal drainage.  Has been using Coricidin and Mucinex without relief.

## 2011-12-08 NOTE — ED Provider Notes (Signed)
Chief Complaint  Patient presents with  . Cough  . Shortness of Breath    History of Present Illness:   The patient has had a two-week history of a cough productive of green sputum and wheezing. She has a history of asthma which she usually controls with as needed albuterol. She also complains of nasal congestion and rhinorrhea which is sometimes green, and feels chilled. She denies any fever, sweats, sore throat, headache, nausea, vomiting, or diarrhea.  Review of Systems:  Other than noted above, the patient denies any of the following symptoms. Systemic:  No fever, chills, sweats, fatigue, myalgias, headache, or anorexia. Eye:  No redness, pain or drainage. ENT:  No earache, nasal congestion, rhinorrhea, sinus pressure, or sore throat. Lungs:  No cough, sputum production, wheezing, shortness of breath. Or chest pain. GI:  No nausea, vomiting, abdominal pain or diarrhea. Skin:  No rash or itching.  PMFSH:  Past medical history, family history, social history, meds, and allergies were reviewed.  Physical Exam:   Vital signs:  BP 180/85  Pulse 76  Temp(Src) 98.1 F (36.7 C) (Oral)  Resp 22  SpO2 93%  LMP 09/07/2011 General:  Alert, in no distress. Eye:  No conjunctival injection or drainage. ENT:  TMs and canals were normal, without erythema or inflammation.  Nasal mucosa was clear and uncongested, without drainage.  Mucous membranes were moist.  Pharynx was clear, without exudate or drainage.  There were no oral ulcerations or lesions. Neck:  Supple, no adenopathy, tenderness or mass. Lungs:  No respiratory distress.  She has scattered expiratory wheezes bilaterally both anteriorly and posteriorly. No rales or rhonchi. Rest sounds were somewhat diminished.  Breath sounds were otherwise clear and equal bilaterally. Heart:  Regular rhythm, without gallops, murmers or rubs. Skin:  Clear, warm, and dry, without rash or lesions.   Radiology:  Dg Chest 2 View  12/08/2011  *RADIOLOGY  REPORT*  Clinical Data: Cough, wheezing, shortness of breath.  CHEST - 2 VIEW  Comparison: 11/15/2009  Findings: Cardiomegaly.  No overt edema.  No effusions or focal airspace opacities.  Mild hyperinflation of the lungs.  No acute bony abnormality. Peribronchial thickening.  IMPRESSION: Cardiomegaly, hyperinflation.  Mild bronchitic changes.  Original Report Authenticated By: Cyndie Chime, M.D.   Course in Urgent Care Center:   She was given a DuoNeb breathing treatment and Solu-Medrol 125 mg IM. She tolerated these well without any immediate side effects and felt better afterwards. Reexamination of her lungs reveals breath sounds with no wheezes, rales, or rhonchi.  Assessment:   Diagnoses that have been ruled out:  None  Diagnoses that are still under consideration:  None  Final diagnoses:  Bronchitis  Asthma      Plan:   1.  The following meds were prescribed:   New Prescriptions   CHLORPHENIRAMINE-HYDROCODONE (TUSSIONEX) 10-8 MG/5ML LQCR    Take 5 mLs by mouth every 12 (twelve) hours as needed.   DOXYCYCLINE (VIBRA-TABS) 100 MG TABLET    Take 1 tablet (100 mg total) by mouth 2 (two) times daily.   PREDNISONE 5 MG KIT    Take 1 kit (5 mg total) by mouth daily after breakfast. Prednisone 5 mg 6 day dosepack.  Take as directed.   2.  The patient was instructed in symptomatic care and handouts were given. 3.  The patient was told to return if becoming worse in any way, if no better in 3 or 4 days, and given some red flag symptoms that would  indicate earlier return.   Roque Lias, MD 12/08/11 [phone]

## 2011-12-08 NOTE — Discharge Instructions (Signed)
Asthma Attack Prevention HOW CAN ASTHMA BE PREVENTED? Currently, there is no way to prevent asthma from starting. However, you can take steps to control the disease and prevent its symptoms after you have been diagnosed. Learn about your asthma and how to control it. Take an active role to control your asthma by working with your caregiver to create and follow an asthma action plan. An asthma action plan guides you in taking your medicines properly, avoiding factors that make your asthma worse, tracking your level of asthma control, responding to worsening asthma, and seeking emergency care when needed. To track your asthma, keep records of your symptoms, check your peak flow number using a peak flow meter (handheld device that shows how well air moves out of your lungs), and get regular asthma checkups.  Other ways to prevent asthma attacks include:  Use medicines as your caregiver directs.   Identify and avoid things that make your asthma worse (as much as you can).   Keep track of your asthma symptoms and level of control.   Get regular checkups for your asthma.   With your caregiver, write a detailed plan for taking medicines and managing an asthma attack. Then be sure to follow your action plan. Asthma is an ongoing condition that needs regular monitoring and treatment.   Identify and avoid asthma triggers. A number of outdoor allergens and irritants (pollen, mold, cold air, air pollution) can trigger asthma attacks. Find out what causes or makes your asthma worse, and take steps to avoid those triggers (see below).   Monitor your breathing. Learn to recognize warning signs of an attack, such as slight coughing, wheezing or shortness of breath. However, your lung function may already decrease before you notice any signs or symptoms, so regularly measure and record your peak airflow with a home peak flow meter.   Identify and treat attacks early. If you act quickly, you're less likely to have  a severe attack. You will also need less medicine to control your symptoms. When your peak flow measurements decrease and alert you to an upcoming attack, take your medicine as instructed, and immediately stop any activity that may have triggered the attack. If your symptoms do not improve, get medical help.   Pay attention to increasing quick-relief inhaler use. If you find yourself relying on your quick-relief inhaler (such as albuterol), your asthma is not under control. See your caregiver about adjusting your treatment.  IDENTIFY AND CONTROL FACTORS THAT MAKE YOUR ASTHMA WORSE A number of common things can set off or make your asthma symptoms worse (asthma triggers). Keep track of your asthma symptoms for several weeks, detailing all the environmental and emotional factors that are linked with your asthma. When you have an asthma attack, go back to your asthma diary to see which factor, or combination of factors, might have contributed to it. Once you know what these factors are, you can take steps to control many of them.  Allergies: If you have allergies and asthma, it is important to take asthma prevention steps at home. Asthma attacks (worsening of asthma symptoms) can be triggered by allergies, which can cause temporary increased inflammation of your airways. Minimizing contact with the substance to which you are allergic will help prevent an asthma attack. Animal Dander:   Some people are allergic to the flakes of skin or dried saliva from animals with fur or feathers. Keep these pets out of your home.   If you can't keep a pet outdoors, keep the   pet out of your bedroom and other sleeping areas at all times, and keep the door closed.   Remove carpets and furniture covered with cloth from your home. If that is not possible, keep the pet away from fabric-covered furniture and carpets.  Dust Mites:  Many people with asthma are allergic to dust mites. Dust mites are tiny bugs that are found in  every home, in mattresses, pillows, carpets, fabric-covered furniture, bedcovers, clothes, stuffed toys, fabric, and other fabric-covered items.   Cover your mattress in a special dust-proof cover.   Cover your pillow in a special dust-proof cover, or wash the pillow each week in hot water. Water must be hotter than 130 F to kill dust mites. Cold or warm water used with detergent and bleach can also be effective.   Wash the sheets and blankets on your bed each week in hot water.   Try not to sleep or lie on cloth-covered cushions.   Call ahead when traveling and ask for a smoke-free hotel room. Bring your own bedding and pillows, in case the hotel only supplies feather pillows and down comforters, which may contain dust mites and cause asthma symptoms.   Remove carpets from your bedroom and those laid on concrete, if you can.   Keep stuffed toys out of the bed, or wash the toys weekly in hot water or cooler water with detergent and bleach.  Cockroaches:  Many people with asthma are allergic to the droppings and remains of cockroaches.   Keep food and garbage in closed containers. Never leave food out.   Use poison baits, traps, powders, gels, or paste (for example, boric acid).   If a spray is used to kill cockroaches, stay out of the room until the odor goes away.  Indoor Mold:  Fix leaky faucets, pipes, or other sources of water that have mold around them.   Clean moldy surfaces with a cleaner that has bleach in it.  Pollen and Outdoor Mold:  When pollen or mold spore counts are high, try to keep your windows closed.   Stay indoors with windows closed from late morning to afternoon, if you can. Pollen and some mold spore counts are highest at that time.   Ask your caregiver whether you need to take or increase anti-inflammatory medicine before your allergy season starts.  Irritants:   Tobacco smoke is an irritant. If you smoke, ask your caregiver how you can quit. Ask family  members to quit smoking, too. Do not allow smoking in your home or car.   If possible, do not use a wood-burning stove, kerosene heater, or fireplace. Minimize exposure to all sources of smoke, including incense, candles, fires, and fireworks.   Try to stay away from strong odors and sprays, such as perfume, talcum powder, hair spray, and paints.   Decrease humidity in your home and use an indoor air cleaning device. Reduce indoor humidity to below 60 percent. Dehumidifiers or central air conditioners can do this.   Try to have someone else vacuum for you once or twice a week, if you can. Stay out of rooms while they are being vacuumed and for a short while afterward.   If you vacuum, use a dust mask from a hardware store, a double-layered or microfilter vacuum cleaner bag, or a vacuum cleaner with a HEPA filter.   Sulfites in foods and beverages can be irritants. Do not drink beer or wine, or eat dried fruit, processed potatoes, or shrimp if they cause asthma   symptoms.   Cold air can trigger an asthma attack. Cover your nose and mouth with a scarf on cold or windy days.   Several health conditions can make asthma more difficult to manage, including runny nose, sinus infections, reflux disease, psychological stress, and sleep apnea. Your caregiver will treat these conditions, as well.   Avoid close contact with people who have a cold or the flu, since your asthma symptoms may get worse if you catch the infection from them. Wash your hands thoroughly after touching items that may have been handled by people with a respiratory infection.   Get a flu shot every year to protect against the flu virus, which often makes asthma worse for days or weeks. Also get a pneumonia shot once every five to 10 years.  Drugs:  Aspirin and other painkillers can cause asthma attacks. 10% to 20% of people with asthma have sensitivity to aspirin or a group of painkillers called non-steroidal anti-inflammatory drugs  (NSAIDS), such as ibuprofen and naproxen. These drugs are used to treat pain and reduce fevers. Asthma attacks caused by any of these medicines can be severe and even fatal. These drugs must be avoided in people who have known aspirin sensitive asthma. Products with acetaminophen are considered safe for people who have asthma. It is important that people with aspirin sensitivity read labels of all over-the-counter drugs used to treat pain, colds, coughs, and fever.   Beta blockers and ACE inhibitors are other drugs which you should discuss with your caregiver, in relation to your asthma.  ALLERGY SKIN TESTING  Ask your asthma caregiver about allergy skin testing or blood testing (RAST test) to identify the allergens to which you are sensitive. If you are found to have allergies, allergy shots (immunotherapy) for asthma may help prevent future allergies and asthma. With allergy shots, small doses of allergens (substances to which you are allergic) are injected under your skin on a regular schedule. Over a period of time, your body may become used to the allergen and less responsive with asthma symptoms. You can also take measures to minimize your exposure to those allergens. EXERCISE  If you have exercise-induced asthma, or are planning vigorous exercise, or exercise in cold, humid, or dry environments, prevent exercise-induced asthma by following your caregiver's advice regarding asthma treatment before exercising. Document Released: 09/15/2009 Document Revised: 06/09/2011 Document Reviewed: 09/15/2009 [employer] Patient Information 2012 [employer]. 

## 2011-12-08 NOTE — ED Notes (Signed)
Pt states she's feeling much better, chest doesn't feel as tight and is breathing easier.

## 2012-01-11 ENCOUNTER — Other Ambulatory Visit: Payer: Self-pay | Admitting: Orthopaedic Surgery

## 2012-01-11 ENCOUNTER — Ambulatory Visit
Admission: RE | Admit: 2012-01-11 | Discharge: 2012-01-11 | Disposition: A | Discharge status: Home / Self Care | Payer: BC Managed Care – PPO | Source: Ambulatory Visit | Attending: Orthopaedic Surgery | Admitting: Orthopaedic Surgery

## 2012-01-11 DIAGNOSIS — M7989 Other specified soft tissue disorders: Secondary | ICD-10-CM

## 2012-09-01 ENCOUNTER — Other Ambulatory Visit (HOSPITAL_COMMUNITY): Payer: Self-pay | Admitting: Cardiovascular Disease

## 2012-09-01 DIAGNOSIS — R0602 Shortness of breath: Secondary | ICD-10-CM

## 2012-09-05 ENCOUNTER — Ambulatory Visit (HOSPITAL_COMMUNITY)
Admission: RE | Admit: 2012-09-05 | Discharge: 2012-09-05 | Disposition: A | Discharge status: Home / Self Care | Payer: BC Managed Care – PPO | Source: Ambulatory Visit | Attending: Cardiovascular Disease | Admitting: Cardiovascular Disease

## 2012-09-05 DIAGNOSIS — R0602 Shortness of breath: Secondary | ICD-10-CM | POA: Insufficient documentation

## 2012-09-05 DIAGNOSIS — I059 Rheumatic mitral valve disease, unspecified: Secondary | ICD-10-CM | POA: Insufficient documentation

## 2012-09-05 DIAGNOSIS — I10 Essential (primary) hypertension: Secondary | ICD-10-CM | POA: Insufficient documentation

## 2012-09-05 DIAGNOSIS — R0989 Other specified symptoms and signs involving the circulatory and respiratory systems: Secondary | ICD-10-CM | POA: Insufficient documentation

## 2012-09-05 DIAGNOSIS — I517 Cardiomegaly: Secondary | ICD-10-CM | POA: Insufficient documentation

## 2012-09-05 DIAGNOSIS — R0609 Other forms of dyspnea: Secondary | ICD-10-CM | POA: Insufficient documentation

## 2012-09-05 NOTE — Progress Notes (Signed)
2D Echo Performed 09/05/2012    Paige Marshall, RCS  

## 2013-09-12 ENCOUNTER — Ambulatory Visit: Payer: BC Managed Care – PPO | Admitting: Cardiovascular Disease

## 2013-09-16 ENCOUNTER — Other Ambulatory Visit: Payer: Self-pay | Admitting: Cardiovascular Disease

## 2013-09-18 NOTE — Telephone Encounter (Signed)
Rx was sent to pharmacy electronically. 

## 2013-09-26 ENCOUNTER — Other Ambulatory Visit: Payer: Self-pay | Admitting: Cardiovascular Disease

## 2013-09-28 ENCOUNTER — Telehealth: Payer: Self-pay | Admitting: *Deleted

## 2013-09-28 ENCOUNTER — Encounter: Payer: Self-pay | Admitting: Cardiovascular Disease

## 2013-09-28 ENCOUNTER — Ambulatory Visit (INDEPENDENT_AMBULATORY_CARE_PROVIDER_SITE_OTHER): Payer: BC Managed Care – PPO | Admitting: Cardiovascular Disease

## 2013-09-28 ENCOUNTER — Other Ambulatory Visit: Payer: Self-pay | Admitting: *Deleted

## 2013-09-28 VITALS — BP 142/98 | HR 69 | Ht 67.5 in | Wt 376.0 lb

## 2013-09-28 DIAGNOSIS — G4733 Obstructive sleep apnea (adult) (pediatric): Secondary | ICD-10-CM | POA: Insufficient documentation

## 2013-09-28 DIAGNOSIS — I4891 Unspecified atrial fibrillation: Secondary | ICD-10-CM

## 2013-09-28 DIAGNOSIS — I10 Essential (primary) hypertension: Secondary | ICD-10-CM

## 2013-09-28 MED ORDER — CARVEDILOL 25 MG PO TABS: ORAL_TABLET | ORAL | Status: DC

## 2013-09-28 NOTE — Assessment & Plan Note (Signed)
Mildly elevated diastolic blood pressure today the patient says normally is in the 70-80 mm mercury range

## 2013-09-28 NOTE — Assessment & Plan Note (Signed)
The patient has expressed an interest to explore her bariatric surgery

## 2013-09-28 NOTE — Telephone Encounter (Signed)
Refilled patient's diltiazem, furosemide, and propafenone. Verbally given to pharmacist.

## 2013-09-28 NOTE — Assessment & Plan Note (Signed)
On CPAP. ?

## 2013-09-28 NOTE — Assessment & Plan Note (Signed)
Paroxysmal atrial fibrillation maintaining sinus rhythm on propafenone

## 2013-09-28 NOTE — Progress Notes (Signed)
09/28/2013 Paige Marshall   [birth year]  [id number]  Primary Physician Dorrene German, MD Primary Cardiologist: Runell Gess MD Roseanne Reno   HPI:  The patient is a 57 year old morbidly overweight, widowed [place] American female, mother of 2, grandmother to 3 grandchildren, who I last saw in the office a month ago. She works third shift as a Engineer, materials at [employer]. Her other problems include hypertension and discontinued tobacco abuse 1 year ago, as well as paroxysmal atrial fibrillation, maintaining sinus rhythm on Rythmol. She has obstructive sleep apnea, on CPAP. She has complained of progressive dyspnea and chest pain as well as lower extremity swelling. She was catheterized November 04, 2005, revealing normal coronary arteries and low-normal LV function, suggesting nonischemic cardiomyopathy secondary to uncontrolled hypertension. I began her on Zaroxolyn 2.5 mg daily every day, which resulted in diuresis and improvement in her symptoms of shortness of breath and swelling. A 2D echocardiogram revealed an EF of 45%, mildly decreased compared to prior echocardiogram, but consistent with her LV function by angiography in 2007. She is aware of salt restriction since I saw her a year ago she's remained crevasse be stable patient does need a left total knee replacement. She's been unable to lose weight. She is exploring options including bariatric surgery.    Current Outpatient Prescriptions  Medication Sig Dispense Refill  . carvedilol (COREG) 25 MG tablet TAKE 2 TABLETS BY MOUTH TWICE A DAY  120 tablet  0  . chlorpheniramine-HYDROcodone (TUSSIONEX) 10-8 MG/5ML LQCR Take 5 mLs by mouth every 12 (twelve) hours as needed.  140 mL  0  . diclofenac (VOLTAREN) 75 MG EC tablet Take 75 mg by mouth 2 (two) times daily.       . Diltiazem HCl Coated Beads (DILTIAZEM HCL CD PO) Take by mouth 1 day or 1 dose.      . FUROSEMIDE PO Take by mouth 2 (two) times daily.      . Omega-3  Fatty Acids (FISH OIL CONCENTRATE PO) Take by mouth 1 day or 1 dose.      . PredniSONE 5 MG KIT Take 1 kit (5 mg total) by mouth daily after breakfast. Prednisone 5 mg 6 day dosepack.  Take as directed.  1 kit  0  . PROPAFENONE HCL ER PO Take by mouth 3 (three) times daily.       No current facility-administered medications for this visit.    Allergies  Allergen Reactions  . Penicillins Rash    History   Social History  . Marital Status: Widowed    Spouse Name: N/A    Number of Children: N/A  . Years of Education: N/A   Occupational History  . Not on file.   Social History Main Topics  . Smoking status: Former Games developer  . Smokeless tobacco: Not on file  . Alcohol Use: No  . Drug Use: No  . Sexual Activity: Yes    Birth Control/ Protection: None   Other Topics Concern  . Not on file   Social History Narrative  . No narrative on file     Review of Systems: General: negative for chills, fever, night sweats or weight changes.  Cardiovascular: negative for chest pain, dyspnea on exertion, edema, orthopnea, palpitations, paroxysmal nocturnal dyspnea or shortness of breath Dermatological: negative for rash Respiratory: negative for cough or wheezing Urologic: negative for hematuria Abdominal: negative for nausea, vomiting, diarrhea, bright red blood per rectum, melena, or hematemesis Neurologic: negative for visual changes, syncope, or  dizziness All other systems reviewed and are otherwise negative except as noted above.    Blood pressure 142/98, pulse 69, height 5' 7.5" (1.715 m), weight 376 lb (170.552 kg).  General appearance: alert and no distress Neck: no adenopathy, no carotid bruit, no JVD, supple, symmetrical, trachea midline and thyroid not enlarged, symmetric, no tenderness/mass/nodules Lungs: clear to auscultation bilaterally Heart: regular rate and rhythm, S1, S2 normal, no murmur, click, rub or gallop Extremities: extremities normal, atraumatic, no cyanosis  or edema  EKG /69 with nonspecific ST and T wave changes. There was poor R-wave progression.  ASSESSMENT AND PLAN:   Obstructive sleep apnea On CPAP  ATRIAL FIBRILLATION Paroxysmal atrial fibrillation maintaining sinus rhythm on propafenone  HYPERTENSION Mildly elevated diastolic blood pressure today the patient says normally is in the 70-80 mm mercury range  Morbid obesity The patient has expressed an interest to explore her bariatric surgery      Runell Gess MD [facility], [facility] 09/28/2013 3:06 PM

## 2013-09-28 NOTE — Patient Instructions (Signed)
Your physician recommends that you schedule a follow-up appointment in: 1 YEAR. YOU HAVE BEEN CLEAR FOR YOUR SURGERY.

## 2013-10-01 ENCOUNTER — Encounter: Payer: Self-pay | Admitting: Cardiovascular Disease

## 2013-10-01 ENCOUNTER — Telehealth: Payer: Self-pay | Admitting: Cardiovascular Disease

## 2013-10-01 MED ORDER — DILTIAZEM HCL ER 180 MG PO CP24: 180.0000 mg | ORAL_CAPSULE | Freq: Every day | ORAL | Status: DC

## 2013-10-01 MED ORDER — FUROSEMIDE 80 MG PO TABS: 80.0000 mg | ORAL_TABLET | Freq: Every day | ORAL | Status: DC

## 2013-10-01 MED ORDER — PROPAFENONE HCL 150 MG PO TABS: 150.0000 mg | ORAL_TABLET | Freq: Three times a day (TID) | ORAL | Status: DC

## 2013-10-01 NOTE — Telephone Encounter (Signed)
Was seen by Dr Allyson Sabal on Friday and alll her medicine was suppose to be called in.[facility].Please call today,because she needs her heart medicine.

## 2013-10-01 NOTE — Telephone Encounter (Signed)
Returned call and pt verified x 2.  Pt informed message received and chart reviewed.  Only Rx sent was for carvedilol.  Pt stated she needs refills on all of her meds.  RN informed pt she will need to verify each med w/ dose and instructions for refill as current list does not include that information.   Pt verbalized understanding and reviewed current dosages/instructions.  Refill(s) sent to pharmacy.

## 2013-10-10 ENCOUNTER — Other Ambulatory Visit: Payer: Self-pay | Admitting: Cardiovascular Disease

## 2013-10-15 ENCOUNTER — Other Ambulatory Visit: Payer: Self-pay | Admitting: Cardiovascular Disease

## 2013-11-16 ENCOUNTER — Encounter (INDEPENDENT_AMBULATORY_CARE_PROVIDER_SITE_OTHER): Payer: Self-pay | Admitting: General Surgery

## 2013-11-16 ENCOUNTER — Ambulatory Visit (INDEPENDENT_AMBULATORY_CARE_PROVIDER_SITE_OTHER): Payer: BC Managed Care – PPO | Admitting: General Surgery

## 2013-11-16 ENCOUNTER — Encounter (INDEPENDENT_AMBULATORY_CARE_PROVIDER_SITE_OTHER): Payer: Self-pay

## 2013-11-16 DIAGNOSIS — G4733 Obstructive sleep apnea (adult) (pediatric): Secondary | ICD-10-CM

## 2013-11-16 DIAGNOSIS — I10 Essential (primary) hypertension: Secondary | ICD-10-CM

## 2013-11-16 DIAGNOSIS — Z6841 Body Mass Index (BMI) 40.0 and over, adult: Secondary | ICD-10-CM

## 2013-11-16 DIAGNOSIS — I4891 Unspecified atrial fibrillation: Secondary | ICD-10-CM

## 2013-11-16 NOTE — Patient Instructions (Signed)
Watch EMMI video for gastric bypass Work on stopping smoking We will start our evaluation - please call with any questions

## 2013-11-16 NOTE — Progress Notes (Signed)
[name], female   DOB: 1956/03/27, 58 y.o.   MRN: [id number]  Chief Complaint  Patient presents with  . Weight Loss Surgery    st consult gastric bypass    HPI Paige Marshall is a 58 y.o. female.   HPI 58yo morbidly obese AAF referred by Dr Flossie Dibble for evaluation of weight loss surgery. The patient states that she has struggled majority of her life with her weight. Despite numerous attempts for sustained weight loss  she has been unsuccessful. The patient has tried Atkins diet, protein shakes, Sensa as well as over-the-counter supplements without any long-term success. She states that she wants to get her life back. She states that she has severe degenerative disease in her left knee that is going to require a left knee replacement but was told she needed to lose weight before surgery. 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 She is interested in laparoscopic Roux-en-Y gastric bypass. She currently does water aerobics for activity. Past Medical History  Diagnosis Date  . CHF (congestive heart failure)   . Hypertension   . Paroxysmal atrial fibrillation   . Arthritis   . OSA on CPAP   . Degenerative joint disease of both lower legs     Past Surgical History  Procedure Laterality Date  . Coronary stent placement    . Eye surgery      Family History  Problem Relation Age of Onset  . Heart disease Mother   . Cancer Father     colon    Social History History  Substance Use Topics  . Smoking status: Former Smoker    Quit date: 10/11/2008  . Smokeless tobacco: Never Used  . Alcohol Use: 4.2 oz/week    7 Cans of beer per week     Comment: week    Allergies  Allergen Reactions  . Penicillins Rash    Current Outpatient Prescriptions  Medication Sig Dispense Refill  . acetaminophen (TYLENOL) 650 MG CR tablet Take 650 mg by mouth every 8 (eight) hours as needed for pain.       . carvedilol (COREG) 25 MG tablet TAKE 2 TABLETS BY MOUTH TWICE A DAY  120 tablet  11  . diclofenac (VOLTAREN) 75 MG EC tablet Take 75 mg by mouth 2 (two) times daily.       [name] diltiazem (DILACOR XR) 180 MG 24 hr capsule Take 1 capsule (180 mg total) by mouth daily.  30 capsule  11  . furosemide (LASIX) 80 MG tablet Take 1 tablet (80 mg total) by mouth daily.  60 tablet  11  . Omega-3 Fatty Acids (FISH OIL CONCENTRATE PO) Take by mouth 1 day or 1 dose.      . propafenone (RYTHMOL) 150 MG tablet Take 1 tablet (150 mg total) by mouth every 8 (eight) hours.  90 tablet  11  . Turmeric POWD by Does not apply route.       No current facility-administered medications for this visit.    Review of Systems Review of Systems  Constitutional: Negative for fever, activity change, appetite change and unexpected weight change.       Smokes 1 cig a day; uses cane  HENT: Negative for nosebleeds and trouble swallowing.   Eyes: Negative for photophobia and visual disturbance.  Respiratory: Negative for chest tightness and shortness of breath.        +OSA  on CPAP. Uses some. Says it doesn't fit well, and sometimes takes it off inadvertently.   Cardiovascular: Negative for chest pain and leg swelling.       Denies CP, SOB, orthopnea, PND; some DOE. If has to walk aways like in a grocery store - uses scooter. Last hospitalization for CHF 2009; cath 2007- nml arteries, low EF suggest NICM, moderate pulm HTN; echo 08/2012 - EF 50%, mild reduction in systolic fxn, global hypokinesis  Gastrointestinal: Negative for nausea, vomiting, abdominal pain, diarrhea and constipation.       Denies abd pain, reflux; reports nml colonoscopy 2 yrs ago; reviewed EGD 06/2009 by Dr Henrene Pastor - 2 small antral ulcers. Was Hpylori positive.   Genitourinary: Negative for dysuria, hematuria and difficulty urinating.       +menopause. G2P2. Tubal ligation  Musculoskeletal: Positive for joint swelling. Negative for arthralgias.       Has deg  joint disease in b/l knees - needs L knee replacement.   Skin: Negative for pallor and rash.  Neurological: Negative for dizziness, seizures, facial asymmetry and numbness.       Denies TIA and amaurosis fugax   Hematological: Negative for adenopathy. Does not bruise/bleed easily.  Psychiatric/Behavioral: Negative for behavioral problems and agitation.    Blood pressure 150/100, pulse 80, temperature 97.2 F (36.2 C), temperature source Oral, resp. rate 15, height 5' 7.5" (1.715 m), weight 378 lb 9.6 oz (171.732 kg).  Physical Exam Physical Exam  Vitals reviewed. Constitutional: She is oriented to person, place, and time. She appears well-developed and well-nourished. No distress.  Morbidly obese; evenly distributed  HENT:  Head: Normocephalic and atraumatic.  Right Ear: External ear normal.  Left Ear: External ear normal.  Eyes: Conjunctivae are normal. No scleral icterus.  Neck: Normal range of motion. Neck supple. No tracheal deviation present. No thyromegaly present.  Cardiovascular: Normal rate and normal heart sounds.   Pulmonary/Chest: Effort normal and breath sounds normal. No stridor. No respiratory distress. She has no wheezes.  Abdominal: Soft. She exhibits no distension. There is no tenderness. There is no rebound and no guarding.  Musculoskeletal: She exhibits edema (trace). She exhibits no tenderness.  Lymphadenopathy:    She has no cervical adenopathy.  Neurological: She is alert and oriented to person, place, and time. She exhibits normal muscle tone.  Skin: Skin is warm and dry. No rash noted. She is not diaphoretic. No erythema.  Dry LE skin  Psychiatric: She has a normal mood and affect. Her behavior is normal. Judgment and thought content normal.    Data Reviewed Cath report, echo, egd, extremity xrays (see above) Dr Kennon Holter note  Assessment    Morbid obesity BMI 58.4 Degenerative Disc Disease of b/l Knees HTN H/o CHF paroxsymal A fib  OSA on  CPAP Tobacco use    Plan    The patient meets weight loss surgery criteria. I think the patient would be an acceptable candidate for Laparoscopic Roux-en-Y Gastric bypass.   We discussed laparoscopic Roux-en-Y gastric bypass. We discussed the preoperative, operative and postoperative process. Using diagrams, I explained the surgery in detail including the performance of an EGD near the end of the surgery and an Upper GI swallow study on POD 1. We discussed the typical hospital course including a 2-3 day stay baring any complications.   The patient was given educational material. I quoted the patient that they can expect to lose 50-70% of their excess weight with the gastric bypass. We did discuss the possibility of  weight regain several years after the procedure.  We discussed the risk and benefits of surgery including but not limited to anesthesia risk, bleeding, infection, anastomotic edema requiring a few additional days in the hospital, postop nausea, possible conversion to open procedure, blood clot formation, anastomotic leak, anastomotic stricture, ulcer formation, death, respiratory complications, intestinal blockage, internal hernia, gallstone formation, vitamin and nutritional deficiencies, hair loss, weight regain injury to surrounding structures, failure to lose weight and mood changes.  We discussed that before and after surgery that there would be an alteration in their diet. I explained that we have put them on a diet 2 weeks before surgery. I also explained that they would be on a liquid diet for 2 weeks after surgery. We discussed that they would have to avoid certain foods such as sugar after surgery. We discussed the importance of physical activity as well as compliance with our dietary and supplement recommendations and routine follow-up.  I discussed the importance of tobacco cessation. We discussed the risk of marginal ulcers with tobacco use.  We also discussed the importance  of compliance with CPAP therapy in order to maximize adequate sleep.  I explained to the patient that we will start our evaluation process which includes labs, Upper GI to evaluate stomach and swallowing anatomy, nutritionist consultation, psychiatrist consultation, EKG, CXR, abdominal ultrasound, cardiac clearance. Since she has tested positive for H pylori in past she will need breath test.  We also need to have her CPAP mask/settings evaluated. I also advised her I would test her urine for nicotine several weeks before surgery.  She was given access to watch EMMI video on gastric bypass  Leighton Ruff. Redmond Pulling, MD, FACS General, Bariatric, & Minimally Invasive Surgery [facility], [place]         [facility] 11/16/2013, 4:53 PM

## 2013-11-19 LAB — CBC WITH DIFFERENTIAL/PLATELET
BASOS PCT: 0 % (ref 0–1)
Basophils Absolute: 0 10*3/uL (ref 0.0–0.1)
Eosinophils Absolute: 0.1 10*3/uL (ref 0.0–0.7)
Eosinophils Relative: 2 % (ref 0–5)
HEMATOCRIT: 30.8 % — AB (ref 36.0–46.0)
HEMOGLOBIN: 9.9 g/dL — AB (ref 12.0–15.0)
LYMPHS PCT: 20 % (ref 12–46)
Lymphs Abs: 1 10*3/uL (ref 0.7–4.0)
MCH: 27.6 pg (ref 26.0–34.0)
MCHC: 32.1 g/dL (ref 30.0–36.0)
MCV: 85.8 fL (ref 78.0–100.0)
MONO ABS: 0.4 10*3/uL (ref 0.1–1.0)
MONOS PCT: 9 % (ref 3–12)
NEUTROS ABS: 3.4 10*3/uL (ref 1.7–7.7)
NEUTROS PCT: 69 % (ref 43–77)
Platelets: 268 10*3/uL (ref 150–400)
RBC: 3.59 MIL/uL — AB (ref 3.87–5.11)
RDW: 16.9 % — ABNORMAL HIGH (ref 11.5–15.5)
WBC: 4.8 10*3/uL (ref 4.0–10.5)

## 2013-11-20 LAB — LIPID PANEL
CHOLESTEROL: 190 mg/dL (ref 0–200)
HDL: 53 mg/dL (ref >39)
LDL CALC: 115 mg/dL — AB (ref 0–99)
Total CHOL/HDL Ratio: 3.6 Ratio
Triglycerides: 108 mg/dL (ref <150)
VLDL: 22 mg/dL (ref 0–40)

## 2013-11-20 LAB — COMPREHENSIVE METABOLIC PANEL
ALBUMIN: 4.3 g/dL (ref 3.5–5.2)
ALK PHOS: 58 U/L (ref 39–117)
ALT: 17 U/L (ref 0–35)
AST: 21 U/L (ref 0–37)
BUN: 33 mg/dL — AB (ref 6–23)
CALCIUM: 8.7 mg/dL (ref 8.4–10.5)
CHLORIDE: 103 meq/L (ref 96–112)
CO2: 31 mEq/L (ref 19–32)
Creat: 1.31 mg/dL — ABNORMAL HIGH (ref 0.50–1.10)
GLUCOSE: 112 mg/dL — AB (ref 70–99)
POTASSIUM: 3.9 meq/L (ref 3.5–5.3)
SODIUM: 145 meq/L (ref 135–145)
TOTAL PROTEIN: 6.8 g/dL (ref 6.0–8.3)
Total Bilirubin: 0.9 mg/dL (ref 0.2–1.2)

## 2013-11-20 LAB — TSH: TSH: 1.542 u[IU]/mL (ref 0.350–4.500)

## 2013-11-28 ENCOUNTER — Other Ambulatory Visit: Payer: Self-pay | Admitting: *Deleted

## 2013-11-28 MED ORDER — FUROSEMIDE 80 MG PO TABS: 80.0000 mg | ORAL_TABLET | Freq: Two times a day (BID) | ORAL | Status: DC

## 2013-11-28 NOTE — Telephone Encounter (Signed)
Called patient to confirm directions.   Rx was sent to pharmacy electronically.

## 2013-11-29 ENCOUNTER — Telehealth: Payer: Self-pay | Admitting: *Deleted

## 2013-11-29 NOTE — Telephone Encounter (Signed)
[facility] requesting surgical clearance for gastric bypass.  Dr Gwenlyn Found reviewed the chart and cleared patient from surgery.   Form was faxed back to [facility] giving clearance.

## 2013-12-28 ENCOUNTER — Other Ambulatory Visit: Payer: Self-pay

## 2013-12-28 ENCOUNTER — Ambulatory Visit (HOSPITAL_COMMUNITY)
Admission: RE | Admit: 2013-12-28 | Discharge: 2013-12-28 | Disposition: A | Discharge status: Home / Self Care | Payer: BC Managed Care – PPO | Source: Ambulatory Visit | Attending: General Surgery | Admitting: General Surgery

## 2013-12-28 ENCOUNTER — Other Ambulatory Visit (INDEPENDENT_AMBULATORY_CARE_PROVIDER_SITE_OTHER): Payer: Self-pay | Admitting: General Surgery

## 2013-12-28 ENCOUNTER — Ambulatory Visit (HOSPITAL_COMMUNITY): Payer: BC Managed Care – PPO

## 2013-12-28 DIAGNOSIS — M1710 Unilateral primary osteoarthritis, unspecified knee: Secondary | ICD-10-CM | POA: Insufficient documentation

## 2013-12-28 DIAGNOSIS — R161 Splenomegaly, not elsewhere classified: Secondary | ICD-10-CM | POA: Insufficient documentation

## 2013-12-28 DIAGNOSIS — G4733 Obstructive sleep apnea (adult) (pediatric): Secondary | ICD-10-CM | POA: Insufficient documentation

## 2013-12-28 DIAGNOSIS — I10 Essential (primary) hypertension: Secondary | ICD-10-CM | POA: Insufficient documentation

## 2013-12-28 DIAGNOSIS — I4891 Unspecified atrial fibrillation: Secondary | ICD-10-CM | POA: Insufficient documentation

## 2013-12-28 DIAGNOSIS — I509 Heart failure, unspecified: Secondary | ICD-10-CM | POA: Insufficient documentation

## 2013-12-28 DIAGNOSIS — K838 Other specified diseases of biliary tract: Secondary | ICD-10-CM | POA: Insufficient documentation

## 2013-12-28 DIAGNOSIS — Z6841 Body Mass Index (BMI) 40.0 and over, adult: Secondary | ICD-10-CM | POA: Insufficient documentation

## 2013-12-28 DIAGNOSIS — R109 Unspecified abdominal pain: Secondary | ICD-10-CM | POA: Insufficient documentation

## 2013-12-31 ENCOUNTER — Encounter: Payer: BC Managed Care – PPO | Attending: General Surgery | Admitting: Dietician

## 2013-12-31 ENCOUNTER — Encounter: Payer: Self-pay | Admitting: Dietician

## 2013-12-31 ENCOUNTER — Ambulatory Visit (HOSPITAL_COMMUNITY)
Admission: RE | Admit: 2013-12-31 | Discharge: 2013-12-31 | Disposition: A | Discharge status: Home / Self Care | Payer: BC Managed Care – PPO | Source: Ambulatory Visit | Attending: General Surgery | Admitting: General Surgery

## 2013-12-31 ENCOUNTER — Encounter (HOSPITAL_COMMUNITY)
Admission: RE | Disposition: A | Discharge status: Home / Self Care | Payer: Self-pay | Source: Ambulatory Visit | Attending: General Surgery

## 2013-12-31 DIAGNOSIS — Z713 Dietary counseling and surveillance: Secondary | ICD-10-CM | POA: Insufficient documentation

## 2013-12-31 DIAGNOSIS — A048 Other specified bacterial intestinal infections: Secondary | ICD-10-CM | POA: Insufficient documentation

## 2013-12-31 HISTORY — PX: BREATH TEK H PYLORI: SHX5422

## 2013-12-31 SURGERY — BREATH TEST, FOR HELICOBACTER PYLORI

## 2013-12-31 NOTE — Patient Instructions (Signed)
Patient to call the [facility] to enroll in Pre-Op and Post-Op Nutrition Education when surgery date is scheduled. 

## 2013-12-31 NOTE — Progress Notes (Signed)
  Pre-Op Assessment Visit:  Pre-Operative RYGB Surgery  Medical Nutrition Therapy:  Appt start time: 2536   End time:  1100.  Patient was seen on 12/31/2013 for Pre-Operative RYGB Nutrition Assessment. Assessment and letter of approval faxed to [facility] Bariatric Surgery Program coordinator on 12/31/2013.   Preferred Learning Style:   No preference indicated   Learning Readiness:   Contemplating  Handouts given during visit include:  Pre-Op Goals Bariatric Surgery Protein Shakes  Teaching Method Utilized:  Visual Auditory Hands on  Barriers to learning/adherence to lifestyle change: physically disabled  Demonstrated degree of understanding via:  Teach Back   Patient to call the [facility] to enroll in Pre-Op and Post-Op Nutrition Education when surgery date is scheduled.

## 2014-01-01 ENCOUNTER — Encounter (HOSPITAL_COMMUNITY): Payer: Self-pay | Admitting: General Surgery

## 2014-01-02 ENCOUNTER — Other Ambulatory Visit (INDEPENDENT_AMBULATORY_CARE_PROVIDER_SITE_OTHER): Payer: Self-pay | Admitting: General Surgery

## 2014-01-02 DIAGNOSIS — A048 Other specified bacterial intestinal infections: Secondary | ICD-10-CM

## 2014-01-02 NOTE — Progress Notes (Signed)
Pt's H pylori breath test + Will rx treatment. B/c of her cardiac meds will need to discuss alt meds with pharmacy  Helicobacter Pylori Test This is a blood test which looks for a germ called Helicobacter pylori. This can also be diagnosed by a breath test or a microscopic examination of a portion (biopsy) of the small bowel. H. pylori is a germ that is found in the cells that line the stomach. It is a risk factor for stomach and small bowel ulcers, long standing inflammation of the lining of the stomach, or even ulcers that may occur in the esophagus (the canal that runs from the mouth to the stomach). This bacterium is also a factor in stomach cancer. The amount of the bacteria is found in about 10% of healthy persons younger than 56 years of age and the amount of the bacteria increases with age. Most persons with these bacteria have no symptoms; however, it is thought that when these bacteria cause ulcers, antibiotic medications can be used to help eliminate or reduce the problem.

## 2014-01-02 NOTE — OR Nursing (Signed)
Results of positive breath tek called to [facility] at [facility] who states she will give results to Dr. Redmond Pulling

## 2014-01-03 ENCOUNTER — Encounter (INDEPENDENT_AMBULATORY_CARE_PROVIDER_SITE_OTHER): Payer: Self-pay | Admitting: General Surgery

## 2014-01-03 ENCOUNTER — Telehealth (INDEPENDENT_AMBULATORY_CARE_PROVIDER_SITE_OTHER): Payer: Self-pay | Admitting: General Surgery

## 2014-01-03 DIAGNOSIS — A048 Other specified bacterial intestinal infections: Secondary | ICD-10-CM | POA: Insufficient documentation

## 2014-01-03 HISTORY — DX: Other specified bacterial intestinal infections: A04.8

## 2014-01-04 ENCOUNTER — Telehealth (INDEPENDENT_AMBULATORY_CARE_PROVIDER_SITE_OTHER): Payer: Self-pay | Admitting: General Surgery

## 2014-01-04 NOTE — Telephone Encounter (Signed)
Called patient back and told her that Dr Redmond Pulling sent a note the her heart doctor Dr Gwenlyn Found and he spoke to her pharmacists to see what she can take for her H pylori breath test. I told that Dr. Redmond Pulling wanted to know what he can give, because she has an PCN allergy and heart rhythm med. And patient agree that she will want to see what Dr Gwenlyn Found and the pharmacist tell Dr Redmond Pulling what is the best for her.

## 2014-01-09 ENCOUNTER — Ambulatory Visit (HOSPITAL_COMMUNITY)
Admission: RE | Admit: 2014-01-09 | Discharge: 2014-01-09 | Disposition: A | Discharge status: Home / Self Care | Payer: BC Managed Care – PPO | Source: Ambulatory Visit | Attending: General Surgery | Admitting: General Surgery

## 2014-01-09 DIAGNOSIS — Z1231 Encounter for screening mammogram for malignant neoplasm of breast: Secondary | ICD-10-CM | POA: Insufficient documentation

## 2014-02-27 ENCOUNTER — Telehealth: Payer: Self-pay | Admitting: *Deleted

## 2014-02-27 NOTE — Telephone Encounter (Signed)
Message copied by Chauncy Lean on Wed Feb 27, 2014  2:16 PM ------      Message from: Lorretta Harp      Created: Sun Feb 24, 2014  4:18 PM      Regarding: CPAP       Not sure who her CPAP MD is but her bariatric surgeon wants her referred back there for further evaluation. Is it TK?            JJB ------

## 2014-02-27 NOTE — Telephone Encounter (Signed)
I spoke with patient about her CPAP.  She said that Dr Redmond Pulling is not going to do the surgery and she doesn't want anything to do with doctors right now.  I offered to get in contact with [facility] (her supplier) and get her machine downloaded to check the effectiveness, but she adamantly refused. She said that she would contact me when she is ready.

## 2014-07-02 ENCOUNTER — Other Ambulatory Visit: Payer: Self-pay | Admitting: *Deleted

## 2014-07-03 ENCOUNTER — Other Ambulatory Visit: Payer: Self-pay

## 2014-07-03 MED ORDER — DILTIAZEM HCL ER 180 MG PO CP24: 180.0000 mg | ORAL_CAPSULE | Freq: Every day | ORAL | Status: DC

## 2014-07-03 MED ORDER — PROPAFENONE HCL 150 MG PO TABS: 150.0000 mg | ORAL_TABLET | Freq: Three times a day (TID) | ORAL | Status: DC

## 2014-07-03 MED ORDER — CARVEDILOL 25 MG PO TABS: ORAL_TABLET | ORAL | Status: DC

## 2014-07-03 MED ORDER — FUROSEMIDE 80 MG PO TABS: 80.0000 mg | ORAL_TABLET | Freq: Two times a day (BID) | ORAL | Status: DC

## 2014-07-03 NOTE — Telephone Encounter (Signed)
Rx's sent to pharmacy.  

## 2014-09-10 ENCOUNTER — Ambulatory Visit (INDEPENDENT_AMBULATORY_CARE_PROVIDER_SITE_OTHER): Payer: No Typology Code available for payment source | Admitting: Cardiovascular Disease

## 2014-09-10 ENCOUNTER — Encounter: Payer: Self-pay | Admitting: Cardiovascular Disease

## 2014-09-10 VITALS — BP 136/74 | HR 78 | Ht 67.5 in | Wt 383.5 lb

## 2014-09-10 DIAGNOSIS — Z79899 Other long term (current) drug therapy: Secondary | ICD-10-CM

## 2014-09-10 DIAGNOSIS — I480 Paroxysmal atrial fibrillation: Secondary | ICD-10-CM

## 2014-09-10 DIAGNOSIS — I10 Essential (primary) hypertension: Secondary | ICD-10-CM

## 2014-09-10 MED ORDER — METOLAZONE 2.5 MG PO TABS: 2.5000 mg | ORAL_TABLET | Freq: Two times a day (BID) | ORAL | Status: DC

## 2014-09-10 MED ORDER — DICLOFENAC SODIUM 75 MG PO TBEC: 75.0000 mg | DELAYED_RELEASE_TABLET | Freq: Two times a day (BID) | ORAL | Status: DC

## 2014-09-10 NOTE — Assessment & Plan Note (Signed)
History of hyperlipidemia. Most recent lipid profile performed 11/19/13 revealed a total cholesterol 190, LDL 1:15 and HDL of 53

## 2014-09-10 NOTE — Progress Notes (Signed)
09/10/2014 Paige Marshall   December 06, 1955  [id number]  Primary Physician Philis Fendt, MD Primary Cardiologist: Lorretta Harp MD Renae Gloss   HPI:  The patient is a 58 year old morbidly overweight, widowed [place] American female, mother of 2, grandmother to 3 grandchildren, who I last saw in the office a 1 year ago. She work third shift as a Animal nutritionist at [employer] but a sense out of work because of inability to ambulate.. Her other problems include hypertension and discontinued tobacco abuse 1 year ago, as well as paroxysmal atrial fibrillation, maintaining sinus rhythm on Rythmol. She has obstructive sleep apnea, on CPAP. She has complained of progressive dyspnea and chest pain as well as lower extremity swelling. She was catheterized November 04, 2005, revealing normal coronary arteries and low-normal LV function, suggesting nonischemic cardiomyopathy secondary to uncontrolled hypertension. I began her on Zaroxolyn 2.5 mg daily every day, which resulted in diuresis and improvement in her symptoms of shortness of breath and swelling. A 2D echocardiogram revealed an EF of 45%, mildly decreased compared to prior echocardiogram, but consistent with her LV function by angiography in 2007. She is aware of salt restriction . Since I saw her a year ago she's remained cardiovascularly stable . She does need a left total knee replacement however she never pursued this. She's been unable to lose weight. .   Current Outpatient Prescriptions  Medication Sig Dispense Refill  . acetaminophen (TYLENOL) 650 MG CR tablet Take 650 mg by mouth every 8 (eight) hours as needed for pain.    . carvedilol (COREG) 25 MG tablet TAKE 2 TABLETS BY MOUTH TWICE A DAY 90 tablet 3  . diclofenac (VOLTAREN) 75 MG EC tablet Take 1 tablet (75 mg total) by mouth 2 (two) times daily. 30 tablet 0  . diltiazem (DILACOR XR) 180 MG 24 hr capsule Take 1 capsule (180 mg total) by mouth daily. 90 capsule 3  .  furosemide (LASIX) 80 MG tablet Take 1 tablet (80 mg total) by mouth 2 (two) times daily. 90 tablet 3  . Omega-3 Fatty Acids (FISH OIL CONCENTRATE PO) Take by mouth 1 day or 1 dose.    . propafenone (RYTHMOL) 150 MG tablet Take 1 tablet (150 mg total) by mouth every 8 (eight) hours. 90 tablet 3  . metolazone (ZAROXOLYN) 2.5 MG tablet Take 1 tablet (2.5 mg total) by mouth 2 (two) times daily. Half hour before lasix dose 180 tablet 2   No current facility-administered medications for this visit.    Allergies  Allergen Reactions  . Penicillins Rash    History   Social History  . Marital Status: Widowed    Spouse Name: N/A    Number of Children: N/A  . Years of Education: N/A   Occupational History  . Not on file.   Social History Main Topics  . Smoking status: Former Smoker    Quit date: 10/11/2008  . Smokeless tobacco: Never Used  . Alcohol Use: 4.2 oz/week    7 Cans of beer per week     Comment: week  . Drug Use: No  . Sexual Activity: Yes    Birth Control/ Protection: None   Other Topics Concern  . Not on file   Social History Narrative     Review of Systems: General: negative for chills, fever, night sweats or weight changes.  Cardiovascular: negative for chest pain, dyspnea on exertion, edema, orthopnea, palpitations, paroxysmal nocturnal dyspnea or shortness of breath Dermatological: negative for rash Respiratory:  negative for cough or wheezing Urologic: negative for hematuria Abdominal: negative for nausea, vomiting, diarrhea, bright red blood per rectum, melena, or hematemesis Neurologic: negative for visual changes, syncope, or dizziness All other systems reviewed and are otherwise negative except as noted above.    Blood pressure 136/74, pulse 78, height 5' 7.5" (1.715 m), weight 383 lb 8 oz (173.954 kg), last menstrual period 09/07/2011.  General appearance: alert and no distress Neck: no adenopathy, no carotid bruit, no JVD, supple, symmetrical, trachea  midline and thyroid not enlarged, symmetric, no tenderness/mass/nodules Lungs: clear to auscultation bilaterally Heart: regular rate and rhythm, S1, S2 normal, no murmur, click, rub or gallop Extremities: 1+ pitting edema bilaterally  EKG normal sinus rhythm at 78 with poor R-wave progression and left axis deviation with low limb voltage. I personally reviewed this EKG  ASSESSMENT AND PLAN:   Essential hypertension History of hypertension with blood pressure measured today in the office at 136/74. She is on carvedilol 25 mg a day, diltiazem SR 180 mg a day. Continue current medications at current dosing  ATRIAL FIBRILLATION History of paroxysmal atrial fibrillation maintaining sinus rhythm on Rythmol. Continue current medication at current dosing  HYPERLIPIDEMIA History of hyperlipidemia. Most recent lipid profile performed 11/19/13 revealed a total cholesterol 190, LDL 1:15 and HDL of [place] MD [facility], [facility] 09/10/2014 11:39 AM

## 2014-09-10 NOTE — Patient Instructions (Signed)
START Zaroxlyn 2.5mg  TWICE daily, half an hour BEFORE your Lasix dosage  Your physician recommends that you return for lab work in:3 weeks  Dr.Berry wants you to follow-up in: ONE YEAR. You will receive a reminder letter in the mail two months in advance. If you don't receive a letter, please call our office to schedule the follow-up appointment.

## 2014-09-10 NOTE — Assessment & Plan Note (Signed)
History of paroxysmal atrial fibrillation maintaining sinus rhythm on Rythmol. Continue current medication at current dosing

## 2014-09-10 NOTE — Assessment & Plan Note (Signed)
History of hypertension with blood pressure measured today in the office at 136/74. She is on carvedilol 25 mg a day, diltiazem SR 180 mg a day. Continue current medications at current dosing

## 2014-09-18 ENCOUNTER — Encounter: Payer: Self-pay | Admitting: Cardiovascular Disease

## 2014-10-02 ENCOUNTER — Emergency Department (HOSPITAL_COMMUNITY)
Admission: EM | Admit: 2014-10-02 | Discharge: 2014-10-03 | Disposition: A | Discharge status: Home / Self Care | Payer: No Typology Code available for payment source | Attending: Emergency Medicine | Admitting: Emergency Medicine

## 2014-10-02 ENCOUNTER — Emergency Department (HOSPITAL_COMMUNITY): Payer: No Typology Code available for payment source

## 2014-10-02 ENCOUNTER — Encounter (HOSPITAL_COMMUNITY): Payer: Self-pay | Admitting: *Deleted

## 2014-10-02 DIAGNOSIS — Z9981 Dependence on supplemental oxygen: Secondary | ICD-10-CM | POA: Insufficient documentation

## 2014-10-02 DIAGNOSIS — I509 Heart failure, unspecified: Secondary | ICD-10-CM | POA: Diagnosis not present

## 2014-10-02 DIAGNOSIS — Z87891 Personal history of nicotine dependence: Secondary | ICD-10-CM | POA: Insufficient documentation

## 2014-10-02 DIAGNOSIS — I480 Paroxysmal atrial fibrillation: Secondary | ICD-10-CM | POA: Insufficient documentation

## 2014-10-02 DIAGNOSIS — E780 Pure hypercholesterolemia: Secondary | ICD-10-CM | POA: Insufficient documentation

## 2014-10-02 DIAGNOSIS — G4733 Obstructive sleep apnea (adult) (pediatric): Secondary | ICD-10-CM | POA: Insufficient documentation

## 2014-10-02 DIAGNOSIS — Z7952 Long term (current) use of systemic steroids: Secondary | ICD-10-CM | POA: Insufficient documentation

## 2014-10-02 DIAGNOSIS — Z880 Allergy status to penicillin: Secondary | ICD-10-CM | POA: Diagnosis not present

## 2014-10-02 DIAGNOSIS — M1990 Unspecified osteoarthritis, unspecified site: Secondary | ICD-10-CM | POA: Insufficient documentation

## 2014-10-02 DIAGNOSIS — I10 Essential (primary) hypertension: Secondary | ICD-10-CM | POA: Insufficient documentation

## 2014-10-02 DIAGNOSIS — Z791 Long term (current) use of non-steroidal anti-inflammatories (NSAID): Secondary | ICD-10-CM | POA: Diagnosis not present

## 2014-10-02 DIAGNOSIS — R0602 Shortness of breath: Secondary | ICD-10-CM | POA: Diagnosis present

## 2014-10-02 DIAGNOSIS — Z79899 Other long term (current) drug therapy: Secondary | ICD-10-CM | POA: Diagnosis not present

## 2014-10-02 DIAGNOSIS — J45901 Unspecified asthma with (acute) exacerbation: Secondary | ICD-10-CM | POA: Diagnosis not present

## 2014-10-02 LAB — CBC
HCT: 38.2 % (ref 36.0–46.0)
Hemoglobin: 11.8 g/dL — ABNORMAL LOW (ref 12.0–15.0)
MCH: 29.1 pg (ref 26.0–34.0)
MCHC: 30.9 g/dL (ref 30.0–36.0)
MCV: 94.1 fL (ref 78.0–100.0)
Platelets: 203 10*3/uL (ref 150–400)
RBC: 4.06 MIL/uL (ref 3.87–5.11)
RDW: 16.5 % — ABNORMAL HIGH (ref 11.5–15.5)
WBC: 5.2 10*3/uL (ref 4.0–10.5)

## 2014-10-02 LAB — BASIC METABOLIC PANEL
Anion gap: 12 (ref 5–15)
BUN: 58 mg/dL — ABNORMAL HIGH (ref 6–23)
CALCIUM: 9.3 mg/dL (ref 8.4–10.5)
CO2: 36 mmol/L — ABNORMAL HIGH (ref 19–32)
CREATININE: 2.8 mg/dL — AB (ref 0.50–1.10)
Chloride: 92 mEq/L — ABNORMAL LOW (ref 96–112)
GFR calc Af Amer: 21 mL/min — ABNORMAL LOW (ref >90)
GFR, EST NON AFRICAN AMERICAN: 18 mL/min — AB (ref >90)
GLUCOSE: 113 mg/dL — AB (ref 70–99)
Potassium: 3.3 mmol/L — ABNORMAL LOW (ref 3.5–5.1)
SODIUM: 140 mmol/L (ref 135–145)

## 2014-10-02 LAB — I-STAT TROPONIN, ED: TROPONIN I, POC: 0.01 ng/mL (ref 0.00–0.08)

## 2014-10-02 LAB — BRAIN NATRIURETIC PEPTIDE: B Natriuretic Peptide: 322.4 pg/mL — ABNORMAL HIGH (ref 0.0–100.0)

## 2014-10-02 MED ORDER — POTASSIUM CHLORIDE CRYS ER 20 MEQ PO TBCR
20.0000 meq | EXTENDED_RELEASE_TABLET | Freq: Once | ORAL | Status: AC
  Administered 2014-10-02: 20 meq via ORAL
  Filled 2014-10-02: qty 1

## 2014-10-02 MED ORDER — IPRATROPIUM-ALBUTEROL 0.5-2.5 (3) MG/3ML IN SOLN
3.0000 mL | Freq: Once | RESPIRATORY_TRACT | Status: AC
  Administered 2014-10-02: 3 mL via RESPIRATORY_TRACT
  Filled 2014-10-02: qty 3

## 2014-10-02 MED ORDER — PREDNISONE 20 MG PO TABS
60.0000 mg | ORAL_TABLET | Freq: Once | ORAL | Status: AC
  Administered 2014-10-02: 60 mg via ORAL
  Filled 2014-10-02: qty 3

## 2014-10-02 NOTE — ED Notes (Signed)
NP at bedside.

## 2014-10-02 NOTE — ED Notes (Signed)
Pt given snacks. Per RN.

## 2014-10-02 NOTE — ED Provider Notes (Signed)
CSN: [id number]     Arrival date & time 10/02/14  2117 History   First MD Initiated Contact with Patient 10/02/14 2153     Chief Complaint  Patient presents with  . Shortness of Breath  . Cough     (Consider location/radiation/quality/duration/timing/severity/associated sxs/prior Treatment) Patient is a 58 y.o. female presenting with shortness of breath and cough. The history is provided by the patient.  Shortness of Breath Severity:  Moderate Onset quality:  Gradual Timing:  Intermittent Progression:  Worsening Chronicity:  New Context: activity, URI and weather changes   Relieved by:  Nothing Worsened by:  Activity Ineffective treatments:  Inhaler and rest Associated symptoms: cough and wheezing   Associated symptoms: no fever   Cough:    Cough characteristics:  Non-productive   Severity:  Moderate   Chronicity:  Recurrent Cough Associated symptoms: shortness of breath and wheezing   Associated symptoms: no fever     Past Medical History  Diagnosis Date  . CHF (congestive heart failure)   . Hypertension   . Paroxysmal atrial fibrillation   . Arthritis   . OSA on CPAP   . Degenerative joint disease of both lower legs   . High cholesterol   . Former smoker   . Dyspnea     progressive  . Edema     Lower extremity   Past Surgical History  Procedure Laterality Date  . Coronary stent placement    . Eye surgery    . Breath tek h pylori N/A 12/31/2013    Procedure: BREATH TEK H PYLORI;  Surgeon: Gayland Curry, MD;  Location: [facility];  Service: General;  Laterality: N/A;  . Cardiac catheterization  11/04/2005  . Doppler echocardiography      2 D   EF of 45%  . Angiogram/lv (congenital)  2007  . Sleep study  2011  . Stress myocardial dipyridamole perfusion    . Venous duplex ultrasound  2013   Family History  Problem Relation Age of Onset  . Heart disease Mother   . Cancer Father     colon  . Hypertension Other    History  Substance Use Topics  .  Smoking status: Former Smoker    Quit date: 10/11/2008  . Smokeless tobacco: Never Used  . Alcohol Use: 4.2 oz/week    7 Cans of beer per week     Comment: week   OB History    No data available     Review of Systems  Constitutional: Negative for fever.  Respiratory: Positive for cough, shortness of breath and wheezing.       Allergies  Penicillins  Home Medications   Prior to Admission medications   Medication Sig Start Date End Date Taking? Authorizing Provider  albuterol (PROVENTIL HFA;VENTOLIN HFA) 108 (90 BASE) MCG/ACT inhaler Inhale 1-2 puffs into the lungs every 6 (six) hours as needed for wheezing or shortness of breath.   Yes Historical Provider, MD  allopurinol (ZYLOPRIM) 300 MG tablet Take 300 mg by mouth daily.   Yes Historical Provider, MD  carvedilol (COREG) 25 MG tablet TAKE 2 TABLETS BY MOUTH TWICE A DAY 07/03/14  Yes Lorretta Harp, MD  Chlorpheniramine-DM (CORICIDIN COUGH/COLD) 4-30 MG TABS Take 1 tablet by mouth as needed (for cold).   Yes Historical Provider, MD  diclofenac (VOLTAREN) 75 MG EC tablet Take 1 tablet (75 mg total) by mouth 2 (two) times daily. 09/10/14  Yes Lorretta Harp, MD  diltiazem (DILACOR XR) 180 MG 24  hr capsule Take 1 capsule (180 mg total) by mouth daily. 07/03/14  Yes Lorretta Harp, MD  furosemide (LASIX) 80 MG tablet Take 1 tablet (80 mg total) by mouth 2 (two) times daily. 07/03/14  Yes Lorretta Harp, MD  metolazone (ZAROXOLYN) 2.5 MG tablet Take 1 tablet (2.5 mg total) by mouth 2 (two) times daily. Half hour before lasix dose 09/10/14  Yes Lorretta Harp, MD  Multiple Vitamin (MULTIVITAMIN WITH MINERALS) TABS tablet Take 1 tablet by mouth daily.   Yes Historical Provider, MD  Omega-3 Fatty Acids (FISH OIL CONCENTRATE PO) Take by mouth 1 day or 1 dose.   Yes Historical Provider, MD  propafenone (RYTHMOL) 150 MG tablet Take 1 tablet (150 mg total) by mouth every 8 (eight) hours. 07/03/14  Yes Lorretta Harp, MD  predniSONE  (DELTASONE) 10 MG tablet Take 4 tablets (40 mg total) by mouth daily with breakfast. 10/03/14   Garald Balding, NP   BP 143/69 mmHg  Pulse 68  Temp(Src) 98.9 F (37.2 C) (Oral)  Resp 18  SpO2 99%  LMP 09/07/2011 Physical Exam  ED Course  Procedures (including critical care time) Labs Review Labs Reviewed  CBC - Abnormal; Notable for the following:    Hemoglobin 11.8 (*)    RDW 16.5 (*)    All other components within normal limits  BASIC METABOLIC PANEL - Abnormal; Notable for the following:    Potassium 3.3 (*)    Chloride 92 (*)    CO2 36 (*)    Glucose, Bld 113 (*)    BUN 58 (*)    Creatinine, Ser 2.80 (*)    GFR calc non Af Amer 18 (*)    GFR calc Af Amer 21 (*)    All other components within normal limits  BRAIN NATRIURETIC PEPTIDE - Abnormal; Notable for the following:    B Natriuretic Peptide 322.4 (*)    All other components within normal limits  I-STAT TROPOININ, ED    Imaging Review Dg Chest 2 View (if Patient Has Fever And/or Copd)  10/02/2014   CLINICAL DATA:  Initial evaluation for shortness of breath, cough, chest tightness for 3 days. History of COPD and CHF.  EXAM: CHEST  2 VIEW  COMPARISON:  Prior radiograph from 12/28/2013  FINDINGS: Marked did cardiomegaly is stable. Prominence of the main pulmonary arteries bilaterally also unchanged. Mediastinal silhouette within normal limits. Atherosclerotic calcifications present within the aortic arch.  Lungs are mildly hypoinflated. No focal infiltrates identified. No overt pulmonary edema. There is no pleural effusion. No pneumothorax.  Scattered degenerative changes present within the visualized spine. No acute osseus abnormality.  IMPRESSION: Stable cardiomegaly without pulmonary edema. Main pulmonary arteries are enlarged, suggesting possible pulmonary hypertension. This is stable from prior.   Electronically Signed   By: Jeannine Boga M.D.   On: 10/02/2014 22:04     EKG Interpretation   Date/Time:   Wednesday October 02 2014 22:09:37 EST Ventricular Rate:  64 PR Interval:  214 QRS Duration: 119 QT Interval:  461 QTC Calculation: 476 R Axis:   -55 Text Interpretation:  Sinus rhythm Prolonged PR interval Incomplete left  bundle branch block since last tracing no significant change Confirmed by  Eulis Foster  MD, Vira Agar ([id number]) on 10/02/2014 11:02:22 PM      MDM  She has had by mouth steroids, albuterol, DuoNeb in the emergency room and is feeling better.  Chest x-ray reviewed.  There is new pneumonia.  Labs are been reviewed, although  she does have a slightly elevated BNP.  She has no evidence of edema on her chest x-ray.  Troponin is negative, I feel comfortable sending patient home with a diagnosis of asthma exacerbation with a prescription for 40 mg of steroids daily with regular albuterol use Final diagnoses:  Asthma exacerbation        Garald Balding, NP 10/04/14 2010  Kalman Drape, MD 10/10/14 [phone]

## 2014-10-02 NOTE — ED Notes (Signed)
Pt in c/o cough for the last two weeks, history of asthma, has not used her inhaler recently, unsure of fever, pt coughing constantly in triage

## 2014-10-02 NOTE — ED Notes (Signed)
Pt taken to xray; tech instructed to take pt to room 4 when completed.

## 2014-10-03 MED ORDER — PREDNISONE 10 MG PO TABS: 40.0000 mg | ORAL_TABLET | Freq: Every day | ORAL | Status: DC

## 2014-10-03 NOTE — Discharge Instructions (Signed)

## 2014-10-07 ENCOUNTER — Other Ambulatory Visit: Payer: Self-pay | Admitting: Cardiovascular Disease

## 2014-10-07 NOTE — Telephone Encounter (Signed)
Rx refill sent to patient pharmacy   

## 2014-11-18 ENCOUNTER — Other Ambulatory Visit: Payer: Self-pay | Admitting: Cardiovascular Disease

## 2014-11-18 MED ORDER — FUROSEMIDE 80 MG PO TABS: 80.0000 mg | ORAL_TABLET | Freq: Two times a day (BID) | ORAL | Status: DC

## 2014-11-18 MED ORDER — METOLAZONE 2.5 MG PO TABS: 2.5000 mg | ORAL_TABLET | Freq: Two times a day (BID) | ORAL | Status: DC

## 2014-11-18 MED ORDER — DILTIAZEM HCL ER 180 MG PO CP24: 180.0000 mg | ORAL_CAPSULE | Freq: Every day | ORAL | Status: DC

## 2014-11-18 MED ORDER — PROPAFENONE HCL 150 MG PO TABS
150.0000 mg | ORAL_TABLET | Freq: Three times a day (TID) | ORAL | Status: DC
Start: 2014-11-18 — End: 2015-06-22

## 2014-11-18 NOTE — Telephone Encounter (Signed)
Need an emergency prescription for Propfenone #90 and refills,please call this in today

## 2014-11-18 NOTE — Telephone Encounter (Signed)
Rx(s) sent to pharmacy electronically. Patient notified. 

## 2015-01-14 ENCOUNTER — Inpatient Hospital Stay (HOSPITAL_COMMUNITY)
Admission: EM | Admit: 2015-01-14 | Discharge: 2015-01-21 | DRG: 286 | Disposition: A | Discharge status: Home Health | Payer: No Typology Code available for payment source | Attending: Internal Medicine | Admitting: Internal Medicine

## 2015-01-14 ENCOUNTER — Emergency Department (HOSPITAL_COMMUNITY): Payer: No Typology Code available for payment source

## 2015-01-14 ENCOUNTER — Encounter (HOSPITAL_COMMUNITY): Payer: Self-pay | Admitting: General Practice

## 2015-01-14 ENCOUNTER — Emergency Department (HOSPITAL_COMMUNITY)
Admission: EM | Admit: 2015-01-14 | Discharge: 2015-01-14 | Disposition: A | Discharge status: Home / Self Care | Payer: Self-pay

## 2015-01-14 DIAGNOSIS — E785 Hyperlipidemia, unspecified: Secondary | ICD-10-CM | POA: Diagnosis present

## 2015-01-14 DIAGNOSIS — I5041 Acute combined systolic (congestive) and diastolic (congestive) heart failure: Secondary | ICD-10-CM | POA: Diagnosis present

## 2015-01-14 DIAGNOSIS — E876 Hypokalemia: Secondary | ICD-10-CM | POA: Diagnosis present

## 2015-01-14 DIAGNOSIS — Z720 Tobacco use: Secondary | ICD-10-CM

## 2015-01-14 DIAGNOSIS — Z8249 Family history of ischemic heart disease and other diseases of the circulatory system: Secondary | ICD-10-CM

## 2015-01-14 DIAGNOSIS — E662 Morbid (severe) obesity with alveolar hypoventilation: Secondary | ICD-10-CM | POA: Diagnosis present

## 2015-01-14 DIAGNOSIS — N289 Disorder of kidney and ureter, unspecified: Secondary | ICD-10-CM | POA: Diagnosis not present

## 2015-01-14 DIAGNOSIS — J9621 Acute and chronic respiratory failure with hypoxia: Secondary | ICD-10-CM | POA: Diagnosis present

## 2015-01-14 DIAGNOSIS — N183 Chronic kidney disease, stage 3 unspecified: Secondary | ICD-10-CM

## 2015-01-14 DIAGNOSIS — Z23 Encounter for immunization: Secondary | ICD-10-CM | POA: Diagnosis not present

## 2015-01-14 DIAGNOSIS — Z880 Allergy status to penicillin: Secondary | ICD-10-CM | POA: Diagnosis not present

## 2015-01-14 DIAGNOSIS — I509 Heart failure, unspecified: Secondary | ICD-10-CM | POA: Diagnosis not present

## 2015-01-14 DIAGNOSIS — I480 Paroxysmal atrial fibrillation: Secondary | ICD-10-CM | POA: Diagnosis present

## 2015-01-14 DIAGNOSIS — I5043 Acute on chronic combined systolic (congestive) and diastolic (congestive) heart failure: Secondary | ICD-10-CM | POA: Diagnosis present

## 2015-01-14 DIAGNOSIS — I130 Hypertensive heart and chronic kidney disease with heart failure and stage 1 through stage 4 chronic kidney disease, or unspecified chronic kidney disease: Secondary | ICD-10-CM | POA: Diagnosis present

## 2015-01-14 DIAGNOSIS — F17210 Nicotine dependence, cigarettes, uncomplicated: Secondary | ICD-10-CM | POA: Diagnosis present

## 2015-01-14 DIAGNOSIS — J9601 Acute respiratory failure with hypoxia: Secondary | ICD-10-CM | POA: Diagnosis present

## 2015-01-14 DIAGNOSIS — E875 Hyperkalemia: Secondary | ICD-10-CM | POA: Diagnosis not present

## 2015-01-14 DIAGNOSIS — M170 Bilateral primary osteoarthritis of knee: Secondary | ICD-10-CM | POA: Diagnosis present

## 2015-01-14 DIAGNOSIS — I10 Essential (primary) hypertension: Secondary | ICD-10-CM | POA: Diagnosis present

## 2015-01-14 DIAGNOSIS — M109 Gout, unspecified: Secondary | ICD-10-CM | POA: Diagnosis present

## 2015-01-14 DIAGNOSIS — Z9119 Patient's noncompliance with other medical treatment and regimen: Secondary | ICD-10-CM | POA: Diagnosis present

## 2015-01-14 DIAGNOSIS — R0902 Hypoxemia: Secondary | ICD-10-CM

## 2015-01-14 DIAGNOSIS — Z79899 Other long term (current) drug therapy: Secondary | ICD-10-CM

## 2015-01-14 DIAGNOSIS — Z6841 Body Mass Index (BMI) 40.0 and over, adult: Secondary | ICD-10-CM

## 2015-01-14 DIAGNOSIS — R079 Chest pain, unspecified: Secondary | ICD-10-CM | POA: Diagnosis not present

## 2015-01-14 HISTORY — DX: Pneumonia, unspecified organism: J18.9

## 2015-01-14 HISTORY — DX: Unspecified asthma, uncomplicated: J45.909

## 2015-01-14 HISTORY — DX: Gout, unspecified: M10.9

## 2015-01-14 HISTORY — DX: Obstructive sleep apnea (adult) (pediatric): G47.33

## 2015-01-14 LAB — CBC WITH DIFFERENTIAL/PLATELET
BASOS ABS: 0 10*3/uL (ref 0.0–0.1)
BASOS PCT: 0 % (ref 0–1)
Eosinophils Absolute: 0.1 10*3/uL (ref 0.0–0.7)
Eosinophils Relative: 2 % (ref 0–5)
HCT: 33.9 % — ABNORMAL LOW (ref 36.0–46.0)
Hemoglobin: 10.4 g/dL — ABNORMAL LOW (ref 12.0–15.0)
LYMPHS PCT: 23 % (ref 12–46)
Lymphs Abs: 1.1 10*3/uL (ref 0.7–4.0)
MCH: 29.9 pg (ref 26.0–34.0)
MCHC: 30.7 g/dL (ref 30.0–36.0)
MCV: 97.4 fL (ref 78.0–100.0)
MONO ABS: 0.5 10*3/uL (ref 0.1–1.0)
Monocytes Relative: 9 % (ref 3–12)
NEUTROS ABS: 3.2 10*3/uL (ref 1.7–7.7)
NEUTROS PCT: 66 % (ref 43–77)
PLATELETS: 165 10*3/uL (ref 150–400)
RBC: 3.48 MIL/uL — ABNORMAL LOW (ref 3.87–5.11)
RDW: 17.3 % — AB (ref 11.5–15.5)
WBC: 4.9 10*3/uL (ref 4.0–10.5)

## 2015-01-14 LAB — TROPONIN I: Troponin I: <0.03 ng/mL (ref <0.031)

## 2015-01-14 LAB — BASIC METABOLIC PANEL
ANION GAP: 5 (ref 5–15)
Anion gap: 14 (ref 5–15)
BUN: 32 mg/dL — AB (ref 6–23)
BUN: 34 mg/dL — ABNORMAL HIGH (ref 6–23)
CHLORIDE: 101 mmol/L (ref 96–112)
CHLORIDE: 98 mmol/L (ref 96–112)
CO2: 36 mmol/L — AB (ref 19–32)
CO2: 29 mmol/L (ref 19–32)
CREATININE: 1.4 mg/dL — AB (ref 0.50–1.10)
Calcium: 8.6 mg/dL (ref 8.4–10.5)
Calcium: 8.6 mg/dL (ref 8.4–10.5)
Creatinine, Ser: 1.36 mg/dL — ABNORMAL HIGH (ref 0.50–1.10)
GFR calc Af Amer: 48 mL/min — ABNORMAL LOW (ref >90)
GFR calc Af Amer: 49 mL/min — ABNORMAL LOW (ref >90)
GFR calc non Af Amer: 41 mL/min — ABNORMAL LOW (ref >90)
GFR calc non Af Amer: 43 mL/min — ABNORMAL LOW (ref >90)
GLUCOSE: 107 mg/dL — AB (ref 70–99)
GLUCOSE: 117 mg/dL — AB (ref 70–99)
POTASSIUM: 3.8 mmol/L (ref 3.5–5.1)
Potassium: 3.2 mmol/L — ABNORMAL LOW (ref 3.5–5.1)
Sodium: 142 mmol/L (ref 135–145)
Sodium: 141 mmol/L (ref 135–145)

## 2015-01-14 LAB — POC URINE PREG, ED: Preg Test, Ur: NEGATIVE

## 2015-01-14 LAB — BRAIN NATRIURETIC PEPTIDE: B Natriuretic Peptide: 557.6 pg/mL — ABNORMAL HIGH (ref 0.0–100.0)

## 2015-01-14 LAB — I-STAT TROPONIN, ED: Troponin i, poc: 0.01 ng/mL (ref 0.00–0.08)

## 2015-01-14 MED ORDER — HEPARIN SODIUM (PORCINE) 5000 UNIT/ML IJ SOLN
5000.0000 [IU] | Freq: Three times a day (TID) | INTRAMUSCULAR | Status: DC
  Administered 2015-01-14 – 2015-01-21 (×17): 5000 [IU] via SUBCUTANEOUS
  Filled 2015-01-14 (×22): qty 1

## 2015-01-14 MED ORDER — FUROSEMIDE 10 MG/ML IJ SOLN
80.0000 mg | Freq: Two times a day (BID) | INTRAMUSCULAR | Status: DC
  Administered 2015-01-14 – 2015-01-18 (×8): 80 mg via INTRAVENOUS
  Filled 2015-01-14 (×11): qty 8

## 2015-01-14 MED ORDER — POTASSIUM CHLORIDE CRYS ER 20 MEQ PO TBCR
40.0000 meq | EXTENDED_RELEASE_TABLET | Freq: Two times a day (BID) | ORAL | Status: DC
  Administered 2015-01-14 – 2015-01-18 (×9): 40 meq via ORAL
  Filled 2015-01-14 (×10): qty 2

## 2015-01-14 MED ORDER — PROPAFENONE HCL 150 MG PO TABS
150.0000 mg | ORAL_TABLET | Freq: Three times a day (TID) | ORAL | Status: DC
  Administered 2015-01-14 – 2015-01-21 (×20): 150 mg via ORAL
  Filled 2015-01-14 (×23): qty 1

## 2015-01-14 MED ORDER — OMEGA-3-ACID ETHYL ESTERS 1 G PO CAPS
1.0000 g | ORAL_CAPSULE | Freq: Every day | ORAL | Status: DC
  Administered 2015-01-14 – 2015-01-21 (×8): 1 g via ORAL
  Filled 2015-01-14 (×8): qty 1

## 2015-01-14 MED ORDER — ACETAMINOPHEN 325 MG PO TABS
650.0000 mg | ORAL_TABLET | ORAL | Status: DC | PRN
  Administered 2015-01-15 – 2015-01-19 (×3): 650 mg via ORAL
  Filled 2015-01-14 (×3): qty 2

## 2015-01-14 MED ORDER — CARVEDILOL 25 MG PO TABS
50.0000 mg | ORAL_TABLET | Freq: Two times a day (BID) | ORAL | Status: DC
  Administered 2015-01-14 – 2015-01-21 (×14): 50 mg via ORAL
  Filled 2015-01-14 (×15): qty 2

## 2015-01-14 MED ORDER — ONDANSETRON HCL 4 MG/2ML IJ SOLN: 4.0000 mg | Freq: Four times a day (QID) | INTRAMUSCULAR | Status: DC | PRN

## 2015-01-14 MED ORDER — FUROSEMIDE 10 MG/ML IJ SOLN
40.0000 mg | Freq: Once | INTRAMUSCULAR | Status: AC
  Administered 2015-01-14: 40 mg via INTRAVENOUS
  Filled 2015-01-14: qty 4

## 2015-01-14 MED ORDER — PNEUMOCOCCAL VAC POLYVALENT 25 MCG/0.5ML IJ INJ
0.5000 mL | INJECTION | INTRAMUSCULAR | Status: AC
  Administered 2015-01-15: 0.5 mL via INTRAMUSCULAR
  Filled 2015-01-14: qty 0.5

## 2015-01-14 MED ORDER — ALBUTEROL SULFATE (2.5 MG/3ML) 0.083% IN NEBU: 2.5000 mg | INHALATION_SOLUTION | Freq: Four times a day (QID) | RESPIRATORY_TRACT | Status: DC | PRN

## 2015-01-14 MED ORDER — ASPIRIN 81 MG PO CHEW
324.0000 mg | CHEWABLE_TABLET | Freq: Once | ORAL | Status: AC
  Administered 2015-01-14: 324 mg via ORAL
  Filled 2015-01-14: qty 4

## 2015-01-14 MED ORDER — SODIUM CHLORIDE 0.9 % IJ SOLN
3.0000 mL | Freq: Two times a day (BID) | INTRAMUSCULAR | Status: DC
  Administered 2015-01-15 – 2015-01-21 (×12): 3 mL via INTRAVENOUS

## 2015-01-14 MED ORDER — SODIUM CHLORIDE 0.9 % IV SOLN: 250.0000 mL | INTRAVENOUS | Status: DC | PRN

## 2015-01-14 MED ORDER — SODIUM CHLORIDE 0.9 % IJ SOLN: 3.0000 mL | INTRAMUSCULAR | Status: DC | PRN

## 2015-01-14 MED ORDER — ADULT MULTIVITAMIN W/MINERALS CH
1.0000 | ORAL_TABLET | Freq: Every day | ORAL | Status: DC
  Administered 2015-01-15 – 2015-01-21 (×7): 1 via ORAL
  Filled 2015-01-14 (×7): qty 1

## 2015-01-14 MED ORDER — IPRATROPIUM-ALBUTEROL 0.5-2.5 (3) MG/3ML IN SOLN
3.0000 mL | Freq: Once | RESPIRATORY_TRACT | Status: AC
  Administered 2015-01-14: 3 mL via RESPIRATORY_TRACT
  Filled 2015-01-14: qty 3

## 2015-01-14 NOTE — ED Provider Notes (Signed)
TIME SEEN: 9:35 AM  CHIEF COMPLAINT: shortness of breath, chest pain  HPI: Pt is a 59 y.o. female with history of morbid obesity, obstructive sleep apnea on CPAP, hypertension, atrial fibrillation not on anticoagulation, CHF, hyperlipidemia, tobacco use, asthma who presents to the emergency department with shortness of breath that started over one week ago. Patient reports it is worse with exertion and lying flat. Reports she normally has orthopnea and uses multiple pillows and states this has gone slightly worse. She states she has noticed some swelling in her bilateral hands but the swelling in her legs has not changed from baseline. Has had intermittent left-sided chest pain that she describes as a "pinching" pain that only lasts for less than 60 seconds and then resolves. She states she does not wear oxygen at home. Denies fever but has had cough with white/gray sputum production. No calf pain. No history of PE or DVT.  States she has been using her inhaler at home without relief.  ROS: See HPI Constitutional: no fever  Eyes: no drainage  ENT: no runny nose   Cardiovascular:  chest pain  Resp:  SOB  GI: no vomiting GU: no dysuria Integumentary: no rash  Allergy: no hives  Musculoskeletal: no leg swelling  Neurological: no slurred speech ROS otherwise negative  PAST MEDICAL HISTORY/PAST SURGICAL HISTORY:  Past Medical History  Diagnosis Date  . CHF (congestive heart failure)   . Hypertension   . Paroxysmal atrial fibrillation   . Arthritis   . OSA on CPAP   . Degenerative joint disease of both lower legs   . High cholesterol   . Former smoker   . Dyspnea     progressive  . Edema     Lower extremity    MEDICATIONS:  Prior to Admission medications   Medication Sig Start Date End Date Taking? Authorizing Provider  albuterol (PROVENTIL HFA;VENTOLIN HFA) 108 (90 BASE) MCG/ACT inhaler Inhale 1-2 puffs into the lungs every 6 (six) hours as needed for wheezing or shortness of  breath.    Historical Provider, MD  allopurinol (ZYLOPRIM) 300 MG tablet Take 300 mg by mouth daily.    Historical Provider, MD  carvedilol (COREG) 25 MG tablet Take 2 tablets (50 mg total) by mouth 2 (two) times daily. 10/07/14   Lorretta Harp, MD  Chlorpheniramine-DM (CORICIDIN COUGH/COLD) 4-30 MG TABS Take 1 tablet by mouth as needed (for cold).    Historical Provider, MD  diclofenac (VOLTAREN) 75 MG EC tablet Take 1 tablet (75 mg total) by mouth 2 (two) times daily. 09/10/14   Lorretta Harp, MD  diltiazem (DILACOR XR) 180 MG 24 hr capsule Take 1 capsule (180 mg total) by mouth daily. 11/18/14   Lorretta Harp, MD  furosemide (LASIX) 80 MG tablet Take 1 tablet (80 mg total) by mouth 2 (two) times daily. 11/18/14   Lorretta Harp, MD  metolazone (ZAROXOLYN) 2.5 MG tablet Take 1 tablet (2.5 mg total) by mouth 2 (two) times daily. Half hour before lasix dose 11/18/14   Lorretta Harp, MD  Multiple Vitamin (MULTIVITAMIN WITH MINERALS) TABS tablet Take 1 tablet by mouth daily.    Historical Provider, MD  Omega-3 Fatty Acids (FISH OIL CONCENTRATE PO) Take by mouth 1 day or 1 dose.    Historical Provider, MD  predniSONE (DELTASONE) 10 MG tablet Take 4 tablets (40 mg total) by mouth daily with breakfast. 10/03/14   Junius Creamer, NP  propafenone (RYTHMOL) 150 MG tablet Take 1 tablet (150  mg total) by mouth every 8 (eight) hours. 11/18/14   Lorretta Harp, MD    ALLERGIES:  Allergies  Allergen Reactions  . Penicillins Hives, Itching, Swelling and Rash    SOCIAL HISTORY:  History  Substance Use Topics  . Smoking status: Former Smoker    Quit date: 10/11/2008  . Smokeless tobacco: Never Used  . Alcohol Use: 4.2 oz/week    7 Cans of beer per week     Comment: week    FAMILY HISTORY: Family History  Problem Relation Age of Onset  . Heart disease Mother   . Cancer Father     colon  . Hypertension Other     EXAM: Ht 5\' 7"  (1.702 m)  Wt 374 lb (169.645 kg)  BMI 58.56 kg/m2  LMP  09/07/2011 CONSTITUTIONAL: Alert and oriented and responds appropriately to questions. Well-appearing; well-nourished HEAD: Normocephalic EYES: Conjunctivae clear, PERRL ENT: normal nose; no rhinorrhea; moist mucous membranes; pharynx without lesions noted NECK: Supple, no meningismus, no LAD  CARD: RRR; S1 and S2 appreciated; no murmurs, no clicks, no rubs, no gallops RESP: Normal chest excursion without splinting or tachypnea; breath sounds clear and equal bilaterally; no wheezes, no rhonchi, no rales, sitting upright in bed without talking patient's oxygen saturation is 86% on room air ABD/GI: Normal bowel sounds; non-distended; soft, non-tender, no rebound, no guarding BACK:  The back appears normal and is non-tender to palpation, there is no CVA tenderness EXT: Normal ROM in all joints; non-tender to palpation; chronic nonpitting edema in bilateral lower extremities that she reports is unchanged, patient has some mild swelling in bilateral hands that is nonpitting, no calf tenderness or swelling; normal capillary refill; no cyanosis    SKIN: Normal color for age and race; warm NEURO: Moves all extremities equally PSYCH: The patient's mood and manner are appropriate. Grooming and personal hygiene are appropriate.  MEDICAL DECISION MAKING: patient here with shortness of breath and new onset hypoxia. Does not wear oxygen at home. Has history of asthma, tobacco use and CHF. Will obtain cardiac labs, chest x-ray. We'll give DuoNeb although she has good aeration on exam and no wheezing. Exam is limited however given patient's morbid obesity. Anticipate admission unless we are able to improve patient's symptoms and her oxygen requirement.  ED PROGRESS: Patient's BNP is elevated at 557. Chest x-ray shows vascular congestion, pulmonary edema. We'll give IV Lasix. Discussed with Ebony Hail, NP with hospitalist service who will see patient and place orders for admission. Updated patient and husband at  bedside.      EKG Interpretation  Date/Time:  Tuesday January 14 2015 09:28:57 EDT Ventricular Rate:  63 PR Interval:  232 QRS Duration: 116 QT Interval:  495 QTC Calculation: 507 R Axis:   127 Text Interpretation:  Sinus rhythm Prolonged PR interval Nonspecific intraventricular conduction delay Low voltage, precordial leads Consider anterior infarct Borderline T abnormalities, inferior leads No significant change since last tracing Confirmed by Leontyne Manville,  DO, [place] [id number]) on 01/14/2015 9:32:28 AM        [place], DO 01/14/15 1325

## 2015-01-14 NOTE — H&P (Signed)
[facility] History and Physical                                                                                    Paige Marshall, is a 59 y.o. female  MRN: [id number]   DOB - 1956-09-24  Admit Date - 01/14/2015  Outpatient Primary MD for the patient is Philis Fendt, MD  With History of -  Past Medical History  Diagnosis Date  . CHF (congestive heart failure)   . Hypertension   . Paroxysmal atrial fibrillation   . Arthritis   . OSA on CPAP   . Degenerative joint disease of both lower legs   . High cholesterol   . Former smoker   . Dyspnea     progressive  . Edema     Lower extremity      Past Surgical History  Procedure Laterality Date  . Coronary stent placement    . Eye surgery    . Breath tek h pylori N/A 12/31/2013    Procedure: BREATH TEK H PYLORI;  Surgeon: Gayland Curry, MD;  Location: [facility];  Service: General;  Laterality: N/A;  . Cardiac catheterization  11/04/2005  . Doppler echocardiography      2 D   EF of 45%  . Angiogram/lv (congenital)  2007  . Sleep study  2011  . Stress myocardial dipyridamole perfusion    . Venous duplex ultrasound  2013    in for   Chief Complaint  Patient presents with  . Shortness of Breath  . Chest Pain     HPI 59 year old morbidly obese female with known history of obstructive sleep apnea on CPAP (noncompliant), hypertension, paroxysmal atrial fibrillation currently maintaining sinus rhythm/sinus bradycardia, combined systolic and diastolic heart failure, dyslipidemia and ongoing mild tobacco abuse. She also reports a history of asthma. She presented to the emergency department with shortness of breath. In talking with the patient her symptoms initially began this past Saturday with mild shortness of breath and headache. Because of the headache she thought she had sinus congestion symptoms so she began taking ibuprofen (average 4 tablets a day) for 2 days. Over the next several days her shortness of breath  worsened. She developed marked dyspnea on exertion as well as paroxysmal nocturnal dyspnea and orthopnea by 4/4. Because of the symptoms she presented to the ER. In addition she has noticed that she has had some swelling in her legs worsen usual her hands been more swollen as well as her abdominal girth. She had been fairly active and has been participating in water aerobics but last week she was having difficulty performing water aerobics especially getting out of the pool when she was done. In addition since the onset of the shortness of breath she has noticed left anterior chest tightness primarily with activity that lasts about 2-3 minutes and has no other associated symptoms.  In the ER she was afebrile and normotensive, room air saturations were in the mid 80s, saturations increased to 49% after application of 2 L oxygen. Her chest x-ray revealed a megaly with pulmonary vascular congestion and minimal perihilar edema especially on the right. Her EKG revealed  sinus rhythm with prolonged PR interval. Sodium was 142, potassium 3.2, BUN 32 and creatinine 1.4 noticing this is slight progression/worsening of renal function over the past several years. BNP 557, troponin 0.01, white count was normal, hemoglobin 10.4, glucose 107. She has been given Lasix 40 mg IV 1 in the ER and has begun to void. He is still endorsing orthopnea.  Review of Systems   In addition to the HPI above,  No Fever-chills, myalgias or other constitutional symptoms No changes with Vision or hearing, new weakness, tingling, numbness in any extremity, No problems swallowing food or Liquids, indigestion/reflux No Cough or  palpitations No Abdominal pain, N/V; no melena or hematochezia, no dark tarry stools, Bowel movements are regular, No dysuria, hematuria or flank pain No new skin rashes, lesions, masses or bruises, No new joints pains-aches No polyuria, polydypsia or polyphagia,  *A full 10 point Review of Systems was done,  except as stated above, all other Review of Systems were negative.  Social History History  Substance Use Topics  . Smoking status: Former Smoker    Quit date: 10/11/2008  . Smokeless tobacco: Never Used  . Alcohol Use: 4.2 oz/week    7 Cans of beer per week     Comment: week    Family History Family History  Problem Relation Age of Onset  . Heart disease Mother-deceased age 32 of cardiac related problems    . Cancer Father-he ceased      colon  . Hypertension Diabetes mellitus  Mother Mother and father      Prior to Admission medications   Medication Sig Start Date End Date Taking? Authorizing Provider  albuterol (PROVENTIL HFA;VENTOLIN HFA) 108 (90 BASE) MCG/ACT inhaler Inhale 2 puffs into the lungs every 6 (six) hours as needed for wheezing or shortness of breath.    Yes Historical Provider, MD  allopurinol (ZYLOPRIM) 300 MG tablet Take 300 mg by mouth daily.   Yes Historical Provider, MD  carvedilol (COREG) 25 MG tablet Take 2 tablets (50 mg total) by mouth 2 (two) times daily. 10/07/14  Yes Lorretta Harp, MD  colchicine 0.6 MG tablet Take 0.6 mg by mouth daily.   Yes Historical Provider, MD  diclofenac (VOLTAREN) 75 MG EC tablet Take 1 tablet (75 mg total) by mouth 2 (two) times daily. 09/10/14  Yes Lorretta Harp, MD  diltiazem (DILACOR XR) 180 MG 24 hr capsule Take 1 capsule (180 mg total) by mouth daily. 11/18/14  Yes Lorretta Harp, MD  furosemide (LASIX) 80 MG tablet Take 1 tablet (80 mg total) by mouth 2 (two) times daily. 11/18/14  Yes Lorretta Harp, MD  Omega-3 Fatty Acids (FISH OIL CONCENTRATE PO) Take by mouth 1 day or 1 dose.   Yes Historical Provider, MD  propafenone (RYTHMOL) 150 MG tablet Take 1 tablet (150 mg total) by mouth every 8 (eight) hours. 11/18/14  Yes Lorretta Harp, MD  Chlorpheniramine-DM (CORICIDIN COUGH/COLD) 4-30 MG TABS Take 1 tablet by mouth as needed (for cold).    Historical Provider, MD  metolazone (ZAROXOLYN) 2.5 MG tablet Take 1  tablet (2.5 mg total) by mouth 2 (two) times daily. Half hour before lasix dose Patient not taking: Reported on 01/14/2015 11/18/14   Lorretta Harp, MD  Multiple Vitamin (MULTIVITAMIN WITH MINERALS) TABS tablet Take 1 tablet by mouth daily.    Historical Provider, MD  predniSONE (DELTASONE) 10 MG tablet Take 4 tablets (40 mg total) by mouth daily with breakfast. Patient not taking:  Reported on 01/14/2015 10/03/14   Junius Creamer, NP    Allergies  Allergen Reactions  . Penicillins Hives, Itching, Swelling and Rash    Physical Exam  Vitals  Blood pressure 147/86, pulse 62, temperature 98.6 F (37 C), temperature source Oral, resp. rate 21, height 5\' 7"  (1.702 m), weight 374 lb (169.645 kg), last menstrual period 09/07/2011, SpO2 100 %.   General:  In no acute distress, appears healthy but overweight  Psych:  Normal affect, Denies Suicidal or Homicidal ideations, Awake Alert, Oriented X 3. Speech and thought patterns are clear and appropriate, no apparent short term memory deficits  Neuro:   No focal neurological deficits, CN II through XII intact, Strength 5/5 all 4 extremities, Sensation intact all 4 extremities.  ENT:  Ears and Eyes appear Normal, Conjunctivae clear, PER. Moist oral mucosa without erythema or exudates.  Neck:  Supple, No lymphadenopathy appreciated  Respiratory:  Symmetrical chest wall movement, Good air movement bilaterally, bilateral crackles, 2 L  Cardiac:  RRR, No Murmurs, 1+ LE edema noted, no JVD, No carotid bruits, peripheral pulses palpable at 2+, hands and upper extremities are puffy consistent with bilateral edema  Abdomen:  Positive bowel sounds, Soft, Non tender, Non distended,  No masses appreciated, no obvious hepatosplenomegaly  Skin:  No Cyanosis, Normal Skin Turgor, No Skin Rash or Bruise.  Extremities: Symmetrical without obvious trauma or injury,  no effusions.  Data Review  CBC  Recent Labs Lab 01/14/15 1005  WBC 4.9  HGB 10.4*  HCT  33.9*  PLT 165  MCV 97.4  MCH 29.9  MCHC 30.7  RDW 17.3*  LYMPHSABS 1.1  MONOABS 0.5  EOSABS 0.1  BASOSABS 0.0    Chemistries   Recent Labs Lab 01/14/15 1005  NA 142  K 3.2*  CL 101  CO2 36*  GLUCOSE 107*  BUN 32*  CREATININE 1.40*  CALCIUM 8.6    estimated creatinine clearance is 74.2 mL/min (by C-G formula based on Cr of 1.4).  No results for input(s): TSH, T4TOTAL, T3FREE, THYROIDAB in the last 72 hours.  Invalid input(s): FREET3  Coagulation profile No results for input(s): INR, PROTIME in the last 168 hours.  No results for input(s): DDIMER in the last 72 hours.  Cardiac Enzymes No results for input(s): CKMB, TROPONINI, MYOGLOBIN in the last 168 hours.  Invalid input(s): CK  Invalid input(s): POCBNP  Urinalysis    Component Value Date/Time   COLORURINE YELLOW 11/16/2009 0554   APPEARANCEUR CLOUDY* 11/16/2009 0554   LABSPEC 1.019 11/16/2009 0554   PHURINE 5.0 11/16/2009 [place] 11/16/2009 0554   HGBUR NEGATIVE 11/16/2009 0554   BILIRUBINUR SMALL* 11/16/2009 0554   KETONESUR NEGATIVE 11/16/2009 0554   PROTEINUR NEGATIVE 11/16/2009 0554   UROBILINOGEN 1.0 11/16/2009 0554   NITRITE NEGATIVE 11/16/2009 0554   LEUKOCYTESUR NEGATIVE 11/16/2009 0554    Imaging results:   Dg Chest 2 View  01/14/2015   CLINICAL DATA:  Increased shortness of breath since Saturday, blurred vision, history asthma, CHF, obesity, hypertension, former smoker  EXAM: CHEST  2 VIEW  COMPARISON:  10/02/2014  FINDINGS: Enlargement of cardiac silhouette with pulmonary vascular congestion.  Atherosclerotic calcification and minimal tortuosity of thoracic aorta.  Slightly increased perihilar markings particularly on RIGHT versus previous exam could reflect minimal pulmonary edema.  Remaining lungs clear.  No pleural effusion or pneumothorax.  No acute osseous findings.  IMPRESSION: Enlargement of cardiac silhouette with pulmonary vascular congestion and question minimal  perihilar edema particularly on RIGHT.  Electronically Signed   By: Lavonia Dana M.D.   On: 01/14/2015 12:47     EKG: Sinus rhythm with prolonged PR interval   Assessment & Plan  Principal Problem:   Acute respiratory failure with hypoxia /Acute combined systolic and diastolic congestive heart failure -Admit to telemetry -Obtain 2-D echocardiogram; concerned that previous mild systolic dysfunction with global hypokinesis has progressed -Hold calcium channel blocker due to suspicion of progressed systolic dysfunction -Lasix 80 mg IV every 12 hours -Intake and output, daily weights -Continue carvedilol  Active Problems:   Obesity hypoventilation syndrome /OSA on CPAP -Order CPAP for use at hour of sleep -Last echo 2013 without RV dilatation or pulmonary hypertension      Essential hypertension -Continue carvedilol    PAF (paroxysmal atrial fibrillation) -Currently maintaining sinus rhythm but has prolonged PR interval -Continue carvedilol and Rythmol -May need to adjust dosing based on prolonged PR interval -Not on anticoagulation including aspirin    Renal insufficiency -Renal function has progressively worsened since 2012 noting in 2012 BUN 10 and creatinine 1.0 -In 2015 February BUN was 33 and creatinine 1.31 but in December 2015 renal function had worsened with BUN 58 and creatinine 2.80 -Current renal function BUN 32 and creatinine 1.4 -Follow-up on echo -May benefit from renal ultrasound    Hypokalemia -Orally replete -Follow labs    Dyslipidemia -Continue omega-3 fatty acids -Check fasting lipid panel    History of gout -In setting of renal dysfunction/need for aggressive diuresis will hold allopurinol and colchicine for now    [place] smokes only 1 cigarette per day    Morbid obesity -Nutrition counseling for diet education and weight reduction strategies    DVT Prophylaxis: Subcutaneous heparin  Family Communication:   Multiple family  members at bedside with patient's permission  Code Status: Full code   Condition: Stable   Time spent in minutes : 60   Yancy Knoble L. ANP on 01/14/2015 at 1:58 PM  Between 7am to 7pm - Pager - [phone]  After 7pm go to [email or web address] - password [email or web address]  And look for the night coverage person covering me after hours  [facility]

## 2015-01-14 NOTE — ED Notes (Addendum)
PT has asthma and increased SOB and CHF; obese; been using rescue inhaler for a week but not helping; also states she has had blurred vision for 2 days.

## 2015-01-14 NOTE — Progress Notes (Signed)
RT entered room to place patient on CPAP. Patient stated she does not wear a CPAP and does not want to wear one tonight.

## 2015-01-15 DIAGNOSIS — I10 Essential (primary) hypertension: Secondary | ICD-10-CM

## 2015-01-15 DIAGNOSIS — I5041 Acute combined systolic (congestive) and diastolic (congestive) heart failure: Secondary | ICD-10-CM

## 2015-01-15 DIAGNOSIS — N289 Disorder of kidney and ureter, unspecified: Secondary | ICD-10-CM

## 2015-01-15 DIAGNOSIS — I509 Heart failure, unspecified: Secondary | ICD-10-CM | POA: Diagnosis not present

## 2015-01-15 LAB — LIPID PANEL
CHOL/HDL RATIO: 4.8 ratio
CHOLESTEROL: 167 mg/dL (ref 0–200)
HDL: 35 mg/dL — AB (ref >39)
LDL Cholesterol: 93 mg/dL (ref 0–99)
TRIGLYCERIDES: 194 mg/dL — AB (ref <150)
VLDL: 39 mg/dL (ref 0–40)

## 2015-01-15 LAB — BASIC METABOLIC PANEL
ANION GAP: 9 (ref 5–15)
BUN: 40 mg/dL — ABNORMAL HIGH (ref 6–23)
CALCIUM: 8.6 mg/dL (ref 8.4–10.5)
CO2: 30 mmol/L (ref 19–32)
Chloride: 100 mmol/L (ref 96–112)
Creatinine, Ser: 1.66 mg/dL — ABNORMAL HIGH (ref 0.50–1.10)
GFR, EST AFRICAN AMERICAN: 39 mL/min — AB (ref >90)
GFR, EST NON AFRICAN AMERICAN: 33 mL/min — AB (ref >90)
GLUCOSE: 109 mg/dL — AB (ref 70–99)
POTASSIUM: 5.1 mmol/L (ref 3.5–5.1)
SODIUM: 139 mmol/L (ref 135–145)

## 2015-01-15 LAB — TROPONIN I: Troponin I: <0.03 ng/mL (ref <0.031)

## 2015-01-15 MED ORDER — DILTIAZEM HCL ER 180 MG PO CP24
180.0000 mg | ORAL_CAPSULE | Freq: Every day | ORAL | Status: DC
  Administered 2015-01-15 – 2015-01-21 (×6): 180 mg via ORAL
  Filled 2015-01-15 (×7): qty 1

## 2015-01-15 NOTE — Progress Notes (Signed)
[facility] PROGRESS NOTE  Paige Marshall FBP:[id number] DOB: 1956-06-02 DOA: 01/14/2015 PCP: Kerin Perna, NP  Assessment/Plan: Acute respiratory failure with hypoxia Secondary to acute carmine systolic and diastolic CHF Continue IV Lasix 80 mg every 12 hours.  strict I/O and daily weight. Reports her dyspnea to be better. Continue Coreg Follow 2-D echo.  OHS/OSA Not on CPAP at home. Follow pulmonary artery pressure on 2-D echo. Reports having a sleep study done about 5 years back. Would recommend outpatient sleep study.  Paroxysmal A. fib In sinus rhythm. Continue Coreg , Cardizem and Rythmol. Has mild prolonged PR interval.  Not on anticoagulation. Follows with Dr. Gwenlyn Found.   Essential hypertension Continue home medications  Chronic kidney disease Cr around 1.3-1.5 baseline since 2015.   Morbid obesity Counseled on weight loss and exercise.  hypokalemia Repeated  Tobacco abuse Counseled on smoking cessation.   DVT prophylaxis  Diet: Heart healthy   Code Status: Full code Family Communication: Husband at bedside Disposition Plan: home possibly tomorrow if dyspnea improved   Consultants:  none  Procedures:  2 d echo  Antibiotics:  none  HPI/Subjective: Seen and examined. Reports dyspnea to be better.  Objective: Filed Vitals:   01/15/15 0930  BP: 118/59  Pulse: 68  Temp: 97.4 F (36.3 C)  Resp: 20    Intake/Output Summary (Last 24 hours) at 01/15/15 1458 Last data filed at 01/15/15 1000  Gross per 24 hour  Intake   1080 ml  Output   2000 ml  Net   -920 ml   Filed Weights   01/14/15 0927 01/14/15 1557 01/15/15 0512  Weight: 169.645 kg (374 lb) 169.464 kg (373 lb 9.6 oz) 170.416 kg (375 lb 11.2 oz)    Exam:   General:  Morbidly obese female in no acute distress  HEENT: No pallor, moist oral mucosa, no JVD  Cardiovascular: S1 and S2, no murmurs rub or gallop  Respiratory: Diminished breath sounds bilaterally due to  body habitus  Abdomen: Soft, nondistended, nontender, bowel sounds present  Musculoskeletal: warm, 1+ pitting edema bilaterally  CNS: Alert and oreinted  Data Reviewed: Basic Metabolic Panel:  Recent Labs Lab 01/14/15 01/14/15 1005 01/15/15 0346  NA 141 142 139  K 3.8 3.2* 5.1  CL 98 101 100  CO2 29 36* 30  GLUCOSE 117* 107* 109*  BUN 34* 32* 40*  CREATININE 1.36* 1.40* 1.66*  CALCIUM 8.6 8.6 8.6   Liver Function Tests: No results for input(s): AST, ALT, ALKPHOS, BILITOT, PROT, ALBUMIN in the last 168 hours. No results for input(s): LIPASE, AMYLASE in the last 168 hours. No results for input(s): AMMONIA in the last 168 hours. CBC:  Recent Labs Lab 01/14/15 1005  WBC 4.9  NEUTROABS 3.2  HGB 10.4*  HCT 33.9*  MCV 97.4  PLT 165   Cardiac Enzymes:  Recent Labs Lab 01/14/15 01/14/15 2315 01/15/15 0346  TROPONINI <0.03 <0.03 <0.03   BNP (last 3 results)  Recent Labs  10/02/14 2211 01/14/15 1005  BNP 322.4* 557.6*    ProBNP (last 3 results) No results for input(s): PROBNP in the last 8760 hours.  CBG: No results for input(s): GLUCAP in the last 168 hours.  No results found for this or any previous visit (from the past 240 hour(s)).   Studies: Dg Chest 2 View  01/14/2015   CLINICAL DATA:  Increased shortness of breath since Saturday, blurred vision, history asthma, CHF, obesity, hypertension, former smoker  EXAM: CHEST  2 VIEW  COMPARISON:  10/02/2014  FINDINGS: Enlargement of cardiac silhouette with pulmonary vascular congestion.  Atherosclerotic calcification and minimal tortuosity of thoracic aorta.  Slightly increased perihilar markings particularly on RIGHT versus previous exam could reflect minimal pulmonary edema.  Remaining lungs clear.  No pleural effusion or pneumothorax.  No acute osseous findings.  IMPRESSION: Enlargement of cardiac silhouette with pulmonary vascular congestion and question minimal perihilar edema particularly on RIGHT.    Electronically Signed   By: Lavonia Dana M.D.   On: 01/14/2015 12:47    Scheduled Meds: . carvedilol  50 mg Oral BID  . furosemide  80 mg Intravenous Q12H  . heparin  5,000 Units Subcutaneous 3 times per day  . multivitamin with minerals  1 tablet Oral Daily  . omega-3 acid ethyl esters  1 g Oral Daily  . pneumococcal 23 valent vaccine  0.5 mL Intramuscular Tomorrow-1000  . potassium chloride  40 mEq Oral BID  . propafenone  150 mg Oral 3 times per day  . sodium chloride  3 mL Intravenous Q12H   Continuous Infusions:     Time spent: 25 minutes    Eulene Pekar, [place]  [facility] Pager [phone] If 7PM-7AM, please contact night-coverage at [email or web address], password [facility] 01/15/2015, 2:58 PM  LOS: 1 day

## 2015-01-15 NOTE — Progress Notes (Signed)
  Echocardiogram 2D Echocardiogram has been performed.  Bobbye Charleston 01/15/2015, 2:39 PM

## 2015-01-15 NOTE — Progress Notes (Signed)
Patient refused CPAP for the night  

## 2015-01-15 NOTE — Care Management Note (Addendum)
    Page 1 of 2   01/21/2015     4:31:11 PM CARE MANAGEMENT NOTE 01/21/2015  Patient:  Paige Marshall, Paige Marshall   Account Number:  [id number]  Date Initiated:  01/15/2015  Documentation initiated by:  Ovid Witman  Subjective/Objective Assessment:   Pt adm on 01/14/15 with CHF, renal insufficiency.  PTA, pt independent of ADLS.     Action/Plan:   Willl follow for dc needs as pt progresses.   Anticipated DC Date:  01/18/2015   Anticipated DC Plan:  [place]  CM consult  Medication Assistance      Choice offered to / List presented to:     DME arranged  [facility]      DME agency  [place].        Status of service:  Completed, signed off Medicare Important Message given?  NO (If response is "NO", the following Medicare IM given date fields will be blank) Date Medicare IM given:   Medicare IM given by:   Date Additional Medicare IM given:   Additional Medicare IM given by:    Discharge Disposition:  HOME/SELF CARE  Per UR Regulation:  Reviewed for med. necessity/level of care/duration of stay  If discussed at [place] [facility] [place] of Stay Meetings, dates discussed:    Comments:  01/21/15 Ellan Lambert, RN, BSN [phone] Pt ready for dc home today.  She will need home O2; referral to [facility] for DME needs.  Portable tank to be delivered to pt prior to dc.  Pt requests [facility] for [facility]; [facility] to deliver to home with O2 concentrator delivery. Pt to dc on Eliquis.  30 day trial card and $10 copay card given to pt.  Benefits as follows: 01/21/2015 Called [employer] at [phone]. Talked to [facility]. ELIQUIS is covered. PRIOR AUTHORIZATION is required. Prior [employer] is [phone]. No co-payment for this medication to the patient according to [facility].       [facility]. Notified MD of need for prior auth.  01/17/15 Ellan Lambert, RN, BSN [phone] Pt agreeable to scheduling OP sleep study, per MD recommendations.  OP Sleep  study scheduled at [facility] for June 16 at 8pm.  Appt information put on AVS.

## 2015-01-15 NOTE — Progress Notes (Signed)
Nutrition Education Note  RD consulted for nutrition education.  Lipid Panel     Component Value Date/Time   CHOL 167 01/15/2015 0346   TRIG 194* 01/15/2015 0346   HDL 35* 01/15/2015 0346   CHOLHDL 4.8 01/15/2015 0346   VLDL 39 01/15/2015 0346   LDLCALC 93 01/15/2015 0346    RD provided "Heart Healthy Nutrition Therapy" handout from the Academy of Nutrition and Dietetics. Reviewed patient's dietary recall. Provided examples on ways to decrease sodium and fat intake in diet. Discouraged intake of processed foods and use of salt shaker. Encouraged fresh fruits and vegetables as well as whole grain sources of carbohydrates to maximize fiber intake. Encouraged intake of water and sugar-free, calorie-free beverages. Pt reports drinking 4 cups of grape juice or cranberry juice daily. Teach back method used.  Expect good compliance. Pt reports plans to use less canned foods and cook more fresh vegetables; she also plans to decrease her intake of grape juice. She plans to continue eating baked fish and chicken.   Body mass index is 57.94 kg/(m^2). Pt meets criteria for Morbid Obesity based on current BMI.  Current diet order is Heart Healthy, patient is consuming approximately 100% of meals at this time. Labs and medications reviewed. No further nutrition interventions warranted at this time. RD contact information provided. If additional nutrition issues arise, please re-consult RD.  Pryor Ochoa RD, LDN Inpatient Clinical Dietitian Pager: [phone] After Hours Pager: [phone]

## 2015-01-16 DIAGNOSIS — N183 Chronic kidney disease, stage 3 (moderate): Secondary | ICD-10-CM

## 2015-01-16 DIAGNOSIS — I480 Paroxysmal atrial fibrillation: Secondary | ICD-10-CM

## 2015-01-16 LAB — BASIC METABOLIC PANEL
Anion gap: 7 (ref 5–15)
BUN: 38 mg/dL — AB (ref 6–23)
CHLORIDE: 102 mmol/L (ref 96–112)
CO2: 33 mmol/L — ABNORMAL HIGH (ref 19–32)
Calcium: 9 mg/dL (ref 8.4–10.5)
Creatinine, Ser: 1.46 mg/dL — ABNORMAL HIGH (ref 0.50–1.10)
GFR calc Af Amer: 45 mL/min — ABNORMAL LOW (ref >90)
GFR, EST NON AFRICAN AMERICAN: 39 mL/min — AB (ref >90)
Glucose, Bld: 107 mg/dL — ABNORMAL HIGH (ref 70–99)
Potassium: 4 mmol/L (ref 3.5–5.1)
SODIUM: 142 mmol/L (ref 135–145)

## 2015-01-16 NOTE — Progress Notes (Signed)
Pt refuses CPAP at this time.  

## 2015-01-16 NOTE — Progress Notes (Signed)
[facility] PROGRESS NOTE  Paige Marshall ZPH:[id number] DOB: 1956-01-17 DOA: 01/14/2015 PCP: Kerin Perna, NP   brief narrative 59 year old morbidly obese female with history of hypertension, paroxysmal A. fib,, and systolic and diastolic CHF, smoker who presented with shortness of breath for the past 3 days and was found to be in acute congestive heart failure. Patient admitted to telemetry for diuresis.   Assessment/Plan: Acute respiratory failure with hypoxia Secondary to acute combined systolic and diastolic CHF Continue IV Lasix 80 mg every 12 hours.  strict I/O and daily weight. Reports feeling short of breath during the night but improving overall. Continue Coreg 2-D echo with EF of 40-45% and rate 2 diastolic dysfunction without wall motion abnormality. EF unchanged from previous echo.  ?OHS/OSA Patient reports having a sleep study several years back but denies ever being on CPAP at home. She will need an outpatient study study.  Paroxysmal A. fib In sinus rhythm. Continue Coreg , Cardizem and Rythmol. Has mild prolonged PR interval.  Her CHADS vasc score is at least  3 and she needs anticoagulation. Given GFR > 25 she should qualify fr newer anticoagulation like eliquis. Have consulted  cardiology for further recommendations.  Essential hypertension Continue home medications  Chronic kidney disease Cr around 1.3-1.5 baseline since 2015.   Morbid obesity Counseled on weight loss and exercise.  hypokalemia Repeated  Tobacco abuse Counseled on smoking cessation.   DVT prophylaxis  Diet: Heart healthy   Code Status: Full code Family Communication: Husband at bedside Disposition Plan: will need additional day for IV diuresis. home possibly tomorrow    Consultants:  none  Procedures:  2 d echo  Antibiotics:  none  HPI/Subjective: Seen and examined. Reports having shortness of breath during the night.  Objective: Filed Vitals:   01/16/15 1339  BP: 120/85  Pulse: 63  Temp: 97.5 F (36.4 C)  Resp: 22    Intake/Output Summary (Last 24 hours) at 01/16/15 1351 Last data filed at 01/16/15 1340  Gross per 24 hour  Intake   1262 ml  Output   3100 ml  Net  -1838 ml   Filed Weights   01/14/15 1557 01/15/15 0512 01/16/15 0705  Weight: 169.464 kg (373 lb 9.6 oz) 170.416 kg (375 lb 11.2 oz) 169.509 kg (373 lb 11.2 oz)    Exam:   General:  Morbidly obese female in no acute distress  HEENT:  moist oral mucosa, no JVD  Cardiovascular: S1 and S2, no murmurs rub or gallop  Respiratory: Diminished breath sounds bilaterally due to body habitus  Abdomen: Soft, nondistended, nontender, bowel sounds present  Musculoskeletal: warm, trace pitting edema bilaterally    Data Reviewed: Basic Metabolic Panel:  Recent Labs Lab 01/14/15 01/14/15 1005 01/15/15 0346 01/16/15 0545  NA 141 142 139 142  K 3.8 3.2* 5.1 4.0  CL 98 101 100 102  CO2 29 36* 30 33*  GLUCOSE 117* 107* 109* 107*  BUN 34* 32* 40* 38*  CREATININE 1.36* 1.40* 1.66* 1.46*  CALCIUM 8.6 8.6 8.6 9.0   Liver Function Tests: No results for input(s): AST, ALT, ALKPHOS, BILITOT, PROT, ALBUMIN in the last 168 hours. No results for input(s): LIPASE, AMYLASE in the last 168 hours. No results for input(s): AMMONIA in the last 168 hours. CBC:  Recent Labs Lab 01/14/15 1005  WBC 4.9  NEUTROABS 3.2  HGB 10.4*  HCT 33.9*  MCV 97.4  PLT 165   Cardiac Enzymes:  Recent Labs Lab 01/14/15 01/14/15 2315 01/15/15 0346  TROPONINI <0.03 <0.03 <0.03   BNP (last 3 results)  Recent Labs  10/02/14 2211 01/14/15 1005  BNP 322.4* 557.6*    ProBNP (last 3 results) No results for input(s): PROBNP in the last 8760 hours.  CBG: No results for input(s): GLUCAP in the last 168 hours.  No results found for this or any previous visit (from the past 240 hour(s)).   Studies: No results found.  Scheduled Meds: . carvedilol  50 mg Oral BID  .  diltiazem  180 mg Oral Daily  . furosemide  80 mg Intravenous Q12H  . heparin  5,000 Units Subcutaneous 3 times per day  . multivitamin with minerals  1 tablet Oral Daily  . omega-3 acid ethyl esters  1 g Oral Daily  . potassium chloride  40 mEq Oral BID  . propafenone  150 mg Oral 3 times per day  . sodium chloride  3 mL Intravenous Q12H   Continuous Infusions:     Time spent: 25 minutes    Jeramey Lanuza, [place]  [facility] Pager [phone] If 7PM-7AM, please contact night-coverage at [email or web address], password [facility] 01/16/2015, 1:51 PM  LOS: 2 days

## 2015-01-16 NOTE — Progress Notes (Signed)
Heart Failure Navigator Consult Note  Presentation: Paige Marshall is a 59 year old morbidly obese female with known history of obstructive sleep apnea on CPAP (noncompliant), hypertension, paroxysmal atrial fibrillation currently maintaining sinus rhythm/sinus bradycardia, combined systolic and diastolic heart failure, dyslipidemia and ongoing mild tobacco abuse. She also reports a history of asthma. She presented to the emergency department with shortness of breath. In talking with the patient her symptoms initially began this past Saturday with mild shortness of breath and headache. Because of the headache she thought she had sinus congestion symptoms so she began taking ibuprofen (average 4 tablets a day) for 2 days. Over the next several days her shortness of breath worsened. She developed marked dyspnea on exertion as well as paroxysmal nocturnal dyspnea and orthopnea by 4/4. Because of the symptoms she presented to the ER. In addition she has noticed that she has had some swelling in her legs worsen usual her hands been more swollen as well as her abdominal girth. She had been fairly active and has been participating in water aerobics but last week she was having difficulty performing water aerobics especially getting out of the pool when she was done. In addition since the onset of the shortness of breath she has noticed left anterior chest tightness primarily with activity that lasts about 2-3 minutes and has no other associated symptoms.   Past Medical History  Diagnosis Date  . CHF (congestive heart failure)   . Hypertension   . Paroxysmal atrial fibrillation   . High cholesterol   . Former smoker   . Dyspnea     progressive  . Edema     Lower extremity  . Coronary artery disease   . Asthma   . Pneumonia X 2  . OSA (obstructive sleep apnea)     "never sent CPAP out" (01/14/2015)  . Arthritis     "both knees" (01/14/2015)  . Degenerative joint disease of both lower legs   . Gout      hx in "both knees" (01/14/2015)    History   Social History  . Marital Status: Widowed    Spouse Name: N/A  . Number of Children: N/A  . Years of Education: N/A   Social History Main Topics  . Smoking status: Former Smoker -- 0.33 packs/day for 35 years    Types: Cigarettes    Quit date: 10/11/2008  . Smokeless tobacco: Never Used  . Alcohol Use: 4.8 oz/week    8 Cans of beer per week     Comment: 01/14/2015 "~ 4, 24oz cans beer/wk"  . Drug Use: No  . Sexual Activity: No   Other Topics Concern  . None   Social History Narrative    ECHO:Study Conclusions-01/15/15  - Left ventricle: The cavity size was mildly dilated. Wall thickness was increased in a pattern of severe LVH. Systolic function was mildly to moderately reduced. The estimated ejection fraction was in the range of 40% to 45%. Wall motion was normal; there were no regional wall motion abnormalities. Features are consistent with a pseudonormal left ventricular filling pattern, with concomitant abnormal relaxation and increased filling pressure (grade 2 diastolic dysfunction). - Aortic valve: There was mild regurgitation. - Left atrium: The atrium was moderately dilated. - Right atrium: The atrium was mildly dilated.  Transthoracic echocardiography. M-mode, complete 2D, spectral Doppler, and color Doppler. Birthdate: Patient birthdate: [birth year]. Age: Patient is 59 yr old. Sex: Gender: female. BMI: 58.7 kg/m^2. Blood pressure:   118/59 Patient status: Inpatient. Study  date: Study date: 01/15/2015. Study time: 02:53 PM. Location: Bedside.  BNP    Component Value Date/Time   BNP 557.6* 01/14/2015 1005    ProBNP    Component Value Date/Time   PROBNP 218.0* 11/16/2009 0030     Education Assessment and Provision:  Detailed education and instructions provided on heart failure disease management including the following:  Signs and symptoms of Heart Failure When to call the  physician Importance of daily weights Low sodium diet Fluid restriction Medication management Anticipated future follow-up appointments  Patient education given on each of the above topics.  Patient acknowledges understanding and acceptance of all instructions.  I spoke at length with Ms. Goodnough and 2 sons.  She does say that she has been given HF education before and a good basic understanding of HF.  She says that she was "doing well until lately".  She does not have a scale but has plans to buy one.  I explained the importance of the scale in regards to symptom recognition.  We spent a great deal of time discussing a low sodium diet and high sodium foods to avoid.  She acknowledges that she has work to do in this area.  She has recently been dieting but not always focusing on low sodium foods.  She denies any issues getting or taking medications.  She says that this latest event was a "wake up call" and is willing to take steps to improve her health.  I encouraged her to call with concerns or questions regarding her HF after discharge if needed.  She sees Dr. Gwenlyn Found as an outpatient.  Education Materials:  "Living Better With Heart Failure" Booklet, Daily Weight Tracker Tool   High Risk Criteria for Readmission and/or Poor Patient Outcomes:   EF <30%- No-40-45% with grade 2 dias dys.  2 or more admissions in 6 months- No  Difficult social situation- No--lives alone--has family locally  Demonstrates medication noncompliance-Denies issues with medications   Barriers of Care:  Knowledge and compliance  Discharge Planning:   Plans to discharge to home alone.

## 2015-01-17 ENCOUNTER — Inpatient Hospital Stay (HOSPITAL_COMMUNITY): Payer: No Typology Code available for payment source

## 2015-01-17 DIAGNOSIS — R9431 Abnormal electrocardiogram [ECG] [EKG]: Secondary | ICD-10-CM

## 2015-01-17 DIAGNOSIS — R072 Precordial pain: Secondary | ICD-10-CM

## 2015-01-17 DIAGNOSIS — G4733 Obstructive sleep apnea (adult) (pediatric): Secondary | ICD-10-CM

## 2015-01-17 DIAGNOSIS — E662 Morbid (severe) obesity with alveolar hypoventilation: Secondary | ICD-10-CM

## 2015-01-17 DIAGNOSIS — J9601 Acute respiratory failure with hypoxia: Secondary | ICD-10-CM

## 2015-01-17 LAB — BASIC METABOLIC PANEL
ANION GAP: 5 (ref 5–15)
BUN: 40 mg/dL — ABNORMAL HIGH (ref 6–23)
CALCIUM: 9 mg/dL (ref 8.4–10.5)
CHLORIDE: 104 mmol/L (ref 96–112)
CO2: 33 mmol/L — ABNORMAL HIGH (ref 19–32)
Creatinine, Ser: 1.55 mg/dL — ABNORMAL HIGH (ref 0.50–1.10)
GFR calc Af Amer: 42 mL/min — ABNORMAL LOW (ref >90)
GFR calc non Af Amer: 36 mL/min — ABNORMAL LOW (ref >90)
GLUCOSE: 107 mg/dL — AB (ref 70–99)
Potassium: 4.2 mmol/L (ref 3.5–5.1)
SODIUM: 142 mmol/L (ref 135–145)

## 2015-01-17 MED ORDER — TECHNETIUM TC 99M SESTAMIBI GENERIC - CARDIOLITE: 30.0000 | Freq: Once | INTRAVENOUS | Status: AC | PRN

## 2015-01-17 MED ORDER — LORAZEPAM 2 MG/ML IJ SOLN
2.0000 mg | Freq: Once | INTRAMUSCULAR | Status: AC
  Administered 2015-01-17: 2 mg via INTRAVENOUS
  Filled 2015-01-17: qty 1

## 2015-01-17 NOTE — Evaluation (Addendum)
Physical Therapy Evaluation Patient Details Name: Paige Marshall MRN: [id number] DOB: 1956-04-02 Today's Date: 01/17/2015   History of Present Illness  59 year old morbidly obese female with history of hypertension, paroxysmal A. fib,, and systolic and diastolic CHF, smoker who presented with shortness of breath for the past 3 days and was found to be in acute congestive heart failure. Patient admitted to telemetry for diuresis.  Clinical Impression  Pt admitted with above diagnosis. Pt currently with functional limitations due to the deficits listed below (see PT Problem List). Pt will need home O2 as she desats on RA with and without activity, HHPT, HHRN and a RW.  Will have assist at home .  Will follow acutely.   Pt will benefit from skilled PT to increase their independence and safety with mobility to allow discharge to the venue listed below.      Follow Up Recommendations Home health PT;Supervision/Assistance - 24 hour [facility])    Equipment Recommendations  Rolling walker with 5" wheels;Other (comment) (home O2)    Recommendations for Other Services       Precautions / Restrictions Restrictions Weight Bearing Restrictions: No      Mobility  Bed Mobility               General bed mobility comments: sitting on EOB on arrival.    Transfers Overall transfer level: Needs assistance   Transfers: Sit to/from Stand Sit to Stand: Min guard         General transfer comment: Pt uses momentum with wide BOS to get up.  No cues needed as she has a technique and uses proper hand placement.    Ambulation/Gait Ambulation/Gait assistance: Min guard Ambulation Distance (Feet): 40 Feet (20 feetx2) Assistive device: Rolling walker (2 wheeled) Gait Pattern/deviations: Step-through pattern;Decreased stride length;Decreased step length - right;Decreased step length - left;Trunk flexed;Wide base of support;Drifts right/left;Antalgic   Gait velocity interpretation: Below normal  speed for age/gender General Gait Details: On arrival, pt had just gotten back from ambulating to bathroom on her own.  Pt O2 sat in low to mid 80's.  Placed 02 and sat up to 91-95% on 2L.  Pt does not achieve upright posture even with max cues.  Pt needed cues to stay close to RW.  Pt desats without O2 with activity as well.  Sats after ambulation low.  Sats improve with ambulation with O2.  Obtained incentive spirometer for pt to use 10 x hour to hopefully improve respiratory function.  Needed max cues to stand tall and pt does not stand up even with cues. States this is premorbid.  Recommended pt use RW at home for safety.    Stairs            Wheelchair Mobility    Modified Rankin (Stroke Patients Only)       Balance Overall balance assessment: Needs assistance;History of Falls Sitting-balance support: No upper extremity supported;Feet supported Sitting balance-Leahy Scale: Fair     Standing balance support: Bilateral upper extremity supported;During functional activity Standing balance-Leahy Scale: Poor Standing balance comment: Relies on RW for balance and stability.                              Pertinent Vitals/Pain Pain Assessment: No/denies pain    SATURATION QUALIFICATIONS: (This note is used to comply with regulatory documentation for home oxygen)  Patient Saturations on Room Air at Rest = 86-91%  Patient Saturations on [employer] while  Ambulating = 83-86%  Patient Saturations on 2 Liters of oxygen while Ambulating = 92-96%  Please briefly explain why patient needs home oxygen:Pt desats on RA with and without activity. Will most likely need home O2 at d/c.   Home Living Family/patient expects to be discharged to:: Private residence Living Arrangements: Alone Available Help at Discharge: Family;Available 24 hours/day Type of Home: House Home Access: Stairs to enter Entrance Stairs-Rails: Right Entrance Stairs-Number of Steps: 4 Home Layout: One  level Home Equipment: Walker - standard;Cane - single point;Wheelchair - Press photographer;Shower seat      Prior Function Level of Independence: Independent with assistive device(s)         Comments: Used cane at times.       Hand Dominance        Extremity/Trunk Assessment   Upper Extremity Assessment: Defer to OT evaluation           Lower Extremity Assessment: Generalized weakness      Cervical / Trunk Assessment: Kyphotic  Communication   Communication: No difficulties  Cognition Arousal/Alertness: Awake/alert Behavior During Therapy: WFL for tasks assessed/performed Overall Cognitive Status: Within Functional Limits for tasks assessed                      General Comments      Exercises        Assessment/Plan    PT Assessment Patient needs continued PT services  PT Diagnosis Generalized weakness   PT Problem List Decreased activity tolerance;Decreased balance;Decreased mobility;Decreased knowledge of use of DME;Decreased safety awareness;Decreased knowledge of precautions;Cardiopulmonary status limiting activity  PT Treatment Interventions DME instruction;Gait training;Stair training;Functional mobility training;Therapeutic activities;Therapeutic exercise;Balance training;Patient/family education   PT Goals (Current goals can be found in the Care Plan section) Acute Rehab PT Goals Patient Stated Goal: to go home PT Goal Formulation: With patient Time For Goal Achievement: 01/31/15 Potential to Achieve Goals: Good    Frequency Min 3X/week   Barriers to discharge        Co-evaluation               End of Session Equipment Utilized During Treatment: Gait belt;Other (comment) Activity Tolerance: Patient limited by fatigue Patient left: in bed;with call bell/phone within reach;with family/visitor present Nurse Communication: Mobility status         Time: 3299-2426 PT Time Calculation (min) (ACUTE ONLY): 36  min   Charges:   PT Evaluation $Initial PT Evaluation Tier I: 1 Procedure PT Treatments $Gait Training: 8-22 mins   PT G CodesDenice Marshall 02-16-2015, 2:19 PM Paige Marshall,PT Acute Rehabilitation [phone] (pager)

## 2015-01-17 NOTE — Progress Notes (Signed)
SATURATION QUALIFICATIONS: (This note is used to comply with regulatory documentation for home oxygen)  Patient Saturations on Room Air at Rest = 86-91%  Patient Saturations on Room Air while Ambulating = 83-86%  Patient Saturations on 2 Liters of oxygen while Ambulating = 92-96%  Please briefly explain why patient needs home oxygen:Pt desats on RA with and without activity. Will most likely need home O2 at d/c.  Thanks.  [place] [phone] (pager)

## 2015-01-17 NOTE — Progress Notes (Addendum)
[facility] PROGRESS NOTE  Paige Marshall SNK:[id number] DOB: June 16, 1956 DOA: 01/14/2015 PCP: Kerin Perna, NP   brief narrative 59 year old morbidly obese female with history of hypertension, paroxysmal A. fib,, and systolic and diastolic CHF, smoker who presented with shortness of breath for the past 3 days and was found to be in acute congestive heart failure. Patient admitted to telemetry for diuresis.   Assessment/Plan: Acute respiratory failure with hypoxia Secondary to acute combined systolic and diastolic CHF Continue IV Lasix 80 mg every 12 hours.  strict I/O and daily weight.  2-D echo with EF of 40-45% and rate 2 diastolic dysfunction without wall motion abnormality. EF unchanged from previous echo. Patient seen him a cardiology. Recommends to daily sig and nuclear stress test. Added baby aspirin.  ?OHS/OSA Patient reports having a sleep study several years back but denies ever being on CPAP at home. She will need an outpatient study study. Patient desaturates to 80s on room air and amylase and. Will need home oxygen.  Paroxysmal A. fib In sinus rhythm. Continue Coreg , Cardizem and Rythmol. Has mild prolonged PR interval.  Her CHADS vasc score is at least  3 and she needs anticoagulation. Cardiology recommendations pending.  Essential hypertension Continue home medications  Chronic kidney disease Cr around 1.3-1.5 baseline since 2015.   Morbid obesity Counseled on weight loss and exercise.  hypokalemia Repeated  Tobacco abuse Counseled on smoking cessation.   DVT prophylaxis  Diet: Heart healthy   Code Status: Full code Family Communication: none at bedside Disposition Plan: 2 day [place] today.   Consultants:  none  Procedures:  2 d echo  Antibiotics:  none  HPI/Subjective: Seen and examined. Denies SOB breath today but noted for  O2  desaturation.  Objective: Filed Vitals:   01/17/15 1419  BP: 123/68  Pulse:  66  Temp: 97.4 F (36.3 C)  Resp: 20    Intake/Output Summary (Last 24 hours) at 01/17/15 1454 Last data filed at 01/17/15 1356  Gross per 24 hour  Intake   2043 ml  Output   2400 ml  Net   -357 ml   Filed Weights   01/15/15 0512 01/16/15 0705 01/17/15 0536  Weight: 170.416 kg (375 lb 11.2 oz) 169.509 kg (373 lb 11.2 oz) 170.326 kg (375 lb 8 oz)    Exam:   General:  Morbidly obese female in no acute distress  HEENT:  moist oral mucosa, no JVD  Cardiovascular: S1 and S2, no murmurs rub or gallop  Respiratory: Diminished breath sounds bilaterally due to body habitus  Abdomen: Soft, nondistended, nontender, bowel sounds present  Musculoskeletal: warm, trace pitting edema bilaterally    Data Reviewed: Basic Metabolic Panel:  Recent Labs Lab 01/14/15 01/14/15 1005 01/15/15 0346 01/16/15 0545 01/17/15 0305  NA 141 142 139 142 142  K 3.8 3.2* 5.1 4.0 4.2  CL 98 101 100 102 104  CO2 29 36* 30 33* 33*  GLUCOSE 117* 107* 109* 107* 107*  BUN 34* 32* 40* 38* 40*  CREATININE 1.36* 1.40* 1.66* 1.46* 1.55*  CALCIUM 8.6 8.6 8.6 9.0 9.0   Liver Function Tests: No results for input(s): AST, ALT, ALKPHOS, BILITOT, PROT, ALBUMIN in the last 168 hours. No results for input(s): LIPASE, AMYLASE in the last 168 hours. No results for input(s): AMMONIA in the last 168 hours. CBC:  Recent Labs Lab 01/14/15 1005  WBC 4.9  NEUTROABS 3.2  HGB 10.4*  HCT 33.9*  MCV 97.4  PLT 165  Cardiac Enzymes:  Recent Labs Lab 01/14/15 01/14/15 2315 01/15/15 0346  TROPONINI <0.03 <0.03 <0.03   BNP (last 3 results)  Recent Labs  10/02/14 2211 01/14/15 1005  BNP 322.4* 557.6*    ProBNP (last 3 results) No results for input(s): PROBNP in the last 8760 hours.  CBG: No results for input(s): GLUCAP in the last 168 hours.  No results found for this or any previous visit (from the past 240 hour(s)).   Studies: No results found.  Scheduled Meds: . carvedilol  50 mg Oral  BID  . diltiazem  180 mg Oral Daily  . furosemide  80 mg Intravenous Q12H  . heparin  5,000 Units Subcutaneous 3 times per day  . multivitamin with minerals  1 tablet Oral Daily  . omega-3 acid ethyl esters  1 g Oral Daily  . potassium chloride  40 mEq Oral BID  . propafenone  150 mg Oral 3 times per day  . sodium chloride  3 mL Intravenous Q12H   Continuous Infusions:     Time spent: 25 minutes    Clem Wisenbaker, [place]  [facility] Pager [phone] If 7PM-7AM, please contact night-coverage at [email or web address], password [facility] 01/17/2015, 2:54 PM  LOS: 3 days

## 2015-01-17 NOTE — Consult Note (Signed)
CARDIOLOGY CONSULT NOTE   [name] MRN: [id number], DOB/AGE: 57/16/1957   Admit date: 01/14/2015 Date of Consult: 01/17/2015  Primary Physician: Kerin Perna, NP Primary Cardiologist: Dr Gwenlyn Found  Reason for consult:  Chest pain  Problem List  Past Medical History  Diagnosis Date  . CHF (congestive heart failure)   . Hypertension   . Paroxysmal atrial fibrillation   . High cholesterol   . Former smoker   . Dyspnea     progressive  . Edema     Lower extremity  . Coronary artery disease   . Asthma   . Pneumonia X 2  . OSA (obstructive sleep apnea)     "never sent CPAP out" (01/14/2015)  . Arthritis     "both knees" (01/14/2015)  . Degenerative joint disease of both lower legs   . Gout     hx in "both knees" (01/14/2015)    Past Surgical History  Procedure Laterality Date  . Breath tek h pylori N/A 12/31/2013    Procedure: BREATH TEK H PYLORI;  Surgeon: Gayland Curry, MD;  Location: [facility];  Service: General;  Laterality: N/A;  . Doppler echocardiography      2 D   EF of 45%  . Angiogram/lv (congenital)  2007  . Sleep study  2011  . Stress myocardial dipyridamole perfusion    . Venous duplex ultrasound  2013  . Coronary angioplasty with stent placement  11/04/2005    "1"  . Tubal ligation  1982  . Eye surgery    . Corneal transplant Right ~ 2010     Allergies  Allergies  Allergen Reactions  . Penicillins Hives, Itching, Swelling and Rash    HPI   59 year old morbidly obese female with history of hypertension, paroxysmal A. fib,, and systolic and diastolic CHF, smoker who presented with shortness of breath for the past 3 days and was found to be in acute congestive heart failure. Patient admitted to telemetry for diuresis.  The patient is a 59 year old morbidly overweight, widowed [place] American female, mother of 2, grandmother to 3 grandchildren, who is followed in the clinic by Dr Gwenlyn Found.  She presented on 01/14/15 with DOE was  treated with iv diuresis. She feels better however continues to have on and off chest pain that started the last week. The pain is non-exertional, sharp, retrosternal and lasts 5-6 seconds.   Her other problems include paroxysmal atrial fibrillation, maintaining sinus rhythm on Rythmol. She has obstructive sleep apnea, on CPAP. She has complained of progressive dyspnea and chest pain as well as lower extremity swelling. She was catheterized November 04, 2005, revealing normal coronary arteries and low-normal LV function, suggesting nonischemic cardiomyopathy secondary to uncontrolled hypertension. Her last echo showed LVEF 40-45%, mildly decreased compared to prior echocardiogram. She's been unable to lose weight.   Inpatient Medications  . carvedilol  50 mg Oral BID  . diltiazem  180 mg Oral Daily  . furosemide  80 mg Intravenous Q12H  . heparin  5,000 Units Subcutaneous 3 times per day  . multivitamin with minerals  1 tablet Oral Daily  . omega-3 acid ethyl esters  1 g Oral Daily  . potassium chloride  40 mEq Oral BID  . propafenone  150 mg Oral 3 times per day  . sodium chloride  3 mL Intravenous Q12H    Family History Family History  Problem Relation Age of Onset  . Heart disease Mother   . Cancer Father  colon  . Hypertension Other      Social History History   Social History  . Marital Status: Widowed    Spouse Name: N/A  . Number of Children: N/A  . Years of Education: N/A   Occupational History  . Not on file.   Social History Main Topics  . Smoking status: Former Smoker -- 0.33 packs/day for 35 years    Types: Cigarettes    Quit date: 10/11/2008  . Smokeless tobacco: Never Used  . Alcohol Use: 4.8 oz/week    8 Cans of beer per week     Comment: 01/14/2015 "~ 4, 24oz cans beer/wk"  . Drug Use: No  . Sexual Activity: No   Other Topics Concern  . Not on file   Social History Narrative     Review of Systems  General:  No chills, fever, night sweats or  weight changes.  Cardiovascular:  No chest pain, dyspnea on exertion, edema, orthopnea, palpitations, paroxysmal nocturnal dyspnea. Dermatological: No rash, lesions/masses Respiratory: No cough, dyspnea Urologic: No hematuria, dysuria Abdominal:   No nausea, vomiting, diarrhea, bright red blood per rectum, melena, or hematemesis Neurologic:  No visual changes, wkns, changes in mental status. All other systems reviewed and are otherwise negative except as noted above.  Physical Exam  Blood pressure 130/67, pulse 70, temperature 97.6 F (36.4 C), temperature source Oral, resp. rate 22, height 5' 7.5" (1.715 m), weight 375 lb 8 oz (170.326 kg), last menstrual period 09/07/2011, SpO2 93 %.  General: Pleasant, NAD, morbidly obese Psych: Normal affect. Neuro: Alert and oriented X 3. Moves all extremities spontaneously. HEENT: Normal  Neck: Supple without bruits or JVD. Lungs:  Resp regular and unlabored, CTA. Heart: RRR no s3, s4, or murmurs. Abdomen: Soft, non-tender, non-distended, BS + x 4.  Extremities: No clubbing, cyanosis or edema. DP/PT/Radials 2+ and equal bilaterally.  Labs  Recent Labs  01/14/15 2315 01/15/15 0346  TROPONINI <0.03 <0.03   Lab Results  Component Value Date   WBC 4.9 01/14/2015   HGB 10.4* 01/14/2015   HCT 33.9* 01/14/2015   MCV 97.4 01/14/2015   PLT 165 01/14/2015    Recent Labs Lab 01/17/15 0305  NA 142  K 4.2  CL 104  CO2 33*  BUN 40*  CREATININE 1.55*  CALCIUM 9.0  GLUCOSE 107*   Lab Results  Component Value Date   CHOL 167 01/15/2015   HDL 35* 01/15/2015   LDLCALC 93 01/15/2015   TRIG 194* 01/15/2015   Radiology/Studies  Dg Chest 2 View  01/14/2015   CLINICAL DATA:  Increased shortness of breath since Saturday, blurred vision, history asthma, CHF, obesity, hypertension, former smoker  IMPRESSION: Enlargement of cardiac silhouette with pulmonary vascular congestion and question minimal perihilar edema particularly on RIGHT.     Echocardiogram - 01/15/2015 Left ventricle: The cavity size was mildly dilated. Wall thickness was increased in a pattern of severe LVH. Systolic function was mildly to moderately reduced. The estimated ejection fraction was in the range of 40% to 45%. Wall motion was normal; there were no regional wall motion abnormalities. Features are consistent with a pseudonormal left ventricular filling pattern, with concomitant abnormal relaxation and increased filling pressure (grade 2 diastolic dysfunction). - Aortic valve: There was mild regurgitation. - Left atrium: The atrium was moderately dilated. - Right atrium: The atrium was mildly dilated.  ECG: SR, I.AVB, LAD, new negat T waves in the inferolateral leads     ASSESSMENT AND PLAN  1. Chest pain - very  atypical, however new ECG, however new negative T waves in the inferolateral leads,  - we will schedule a 2 day Lexiscan nuclear stress test - add aspirin 81 mg   2. Acute on chronic combined systolic and diastolic CHF  - the patient was started on iv diuretics with good symptoms improvement, clinically doesn't appear to be fluid overloaded, however difficult to assess with morbid obesity, crea is stable- she is at her baseline weight, she desaturated to 80' while sitting, I would continue iv for another day and reassess. Her SOB is multifactorial and big role plays obesity hypoventilation syndrome    3. Essential hypertension - well controlled with carvedilol and diltiazem SR 180 mg a day  4. ATRIAL FIBRILLATION History of paroxysmal atrial fibrillation maintaining sinus rhythm on Rythmol. Continue current medication at current dosing  Signed, Dorothy Spark, MD, [facility] 01/17/2015, 11:01 AM

## 2015-01-17 NOTE — Progress Notes (Signed)
Pt refuses CPAP at this time.  

## 2015-01-18 ENCOUNTER — Other Ambulatory Visit (HOSPITAL_COMMUNITY): Payer: No Typology Code available for payment source

## 2015-01-18 ENCOUNTER — Encounter (HOSPITAL_COMMUNITY): Payer: No Typology Code available for payment source

## 2015-01-18 DIAGNOSIS — R079 Chest pain, unspecified: Secondary | ICD-10-CM | POA: Diagnosis present

## 2015-01-18 LAB — BASIC METABOLIC PANEL
Anion gap: 10 (ref 5–15)
BUN: 39 mg/dL — AB (ref 6–23)
CALCIUM: 9.3 mg/dL (ref 8.4–10.5)
CO2: 30 mmol/L (ref 19–32)
CREATININE: 1.47 mg/dL — AB (ref 0.50–1.10)
Chloride: 100 mmol/L (ref 96–112)
GFR calc Af Amer: 45 mL/min — ABNORMAL LOW (ref >90)
GFR calc non Af Amer: 39 mL/min — ABNORMAL LOW (ref >90)
Glucose, Bld: 114 mg/dL — ABNORMAL HIGH (ref 70–99)
POTASSIUM: 5.4 mmol/L — AB (ref 3.5–5.1)
Sodium: 140 mmol/L (ref 135–145)

## 2015-01-18 MED ORDER — LORAZEPAM 2 MG/ML IJ SOLN
2.0000 mg | Freq: Once | INTRAMUSCULAR | Status: DC
  Filled 2015-01-18: qty 1

## 2015-01-18 MED ORDER — SODIUM POLYSTYRENE SULFONATE 15 GM/60ML PO SUSP
30.0000 g | Freq: Once | ORAL | Status: AC
  Administered 2015-01-18: 30 g via ORAL
  Filled 2015-01-18: qty 120

## 2015-01-18 NOTE — Progress Notes (Signed)
Pt refused CPAP for tonight. Stated she would call if she changed her mind.

## 2015-01-18 NOTE — Progress Notes (Signed)
[name], female   DOB: 12/04/1955, 59 y.o.   MRN: [id number]    Subjective: Unable to do myovue yesterday too anxious despite ativan.  Taken off schedule this am but still NPO.  Discussed with patient given body size And  Need for two day study will prefer diagnostic cath on Monday  She is ok with this   Objective:  Filed Vitals:   01/17/15 1405 01/17/15 1419 01/17/15 2115 01/18/15 0647  BP: 116/88 123/68 132/89 130/53  Pulse: 71 66 64 77  Temp:  97.4 F (36.3 C) 98.4 F (36.9 C) 97.9 F (36.6 C)  TempSrc:  Oral Oral Oral  Resp:  20 18 20   Height:      Weight:    372 lb 11.2 oz (169.056 kg)  SpO2:  100% 95% 94%    Intake/Output from previous day:  Intake/Output Summary (Last 24 hours) at 01/18/15 1004 Last data filed at 01/18/15 0825  Gross per 24 hour  Intake   1440 ml  Output   2400 ml  Net   -960 ml    Physical Exam: Affect appropriate Obese black female HEENT: normal Neck supple with no adenopathy JVP normal no bruits no thyromegaly Lungs clear with no wheezing and good diaphragmatic motion Heart:  S1/S2 no murmur, no rub, gallop or click PMI normal Abdomen: benighn, BS positve, no tenderness, no AAA no bruit.  No HSM or HJR Distal pulses intact with no bruits Plus 1 bilateral edema Neuro non-focal Skin warm and dry No muscular weakness   Lab Results: Basic Metabolic Panel:  Recent Labs  01/16/15 0545 01/17/15 0305  NA 142 142  K 4.0 4.2  CL 102 104  CO2 33* 33*  GLUCOSE 107* 107*  BUN 38* 40*  CREATININE 1.46* 1.55*  CALCIUM 9.0 9.0     Imaging: No results found.  Cardiac Studies:  ECG:  SR low voltage nonspecific ST/T Wave changes    Telemetry:  NSR no PAF  01/18/2015   Echo:  01/15/15  Reviewed  Study Conclusions  - Left ventricle: The cavity size was mildly dilated. Wall thickness was increased in a pattern of severe LVH. Systolic function was mildly to moderately reduced. The estimated  ejection fraction was in the range of 40% to 45%. Wall motion was normal; there were no regional wall motion abnormalities. Features are consistent with a pseudonormal left ventricular filling pattern, with concomitant abnormal relaxation and increased filling pressure (grade 2 diastolic dysfunction). - Aortic valve: There was mild regurgitation. - Left atrium: The atrium was moderately dilated.  Medications:   . carvedilol  50 mg Oral BID  . diltiazem  180 mg Oral Daily  . furosemide  80 mg Intravenous Q12H  . heparin  5,000 Units Subcutaneous 3 times per day  . LORazepam  2 mg Intravenous Once  . multivitamin with minerals  1 tablet Oral Daily  . omega-3 acid ethyl esters  1 g Oral Daily  . potassium chloride  40 mEq Oral BID  . propafenone  150 mg Oral 3 times per day  . sodium chloride  3 mL Intravenous Q12H       Assessment/Plan:  Chest Pain:  Had femoral cath by [facility] in 2008  No CAD.  Unable to do myovue and with obesity high likelyhood of false  Positive two day study.  Will hold lasix over weekend and plan diagnostic cath on Monday from radial approach  Ok to  Restart heart healthy diet.  PAF:  On propafenone  Will need to reconsider if has CAD and may not be ideal with decrease EF DCM:  Not clear why not on ACE  Consider starting post cath since Cr up and holding diuretics   Jenkins Rouge 01/18/2015, 10:04 AM

## 2015-01-18 NOTE — Progress Notes (Addendum)
[facility] PROGRESS NOTE  Paige Marshall OVF:[id number] DOB: 1956-03-07 DOA: 01/14/2015 PCP: Kerin Perna, NP   brief narrative 59 year old morbidly obese female with history of hypertension, paroxysmal A. fib,, and systolic and diastolic CHF, smoker who presented with shortness of breath for the past 3 days and was found to be in acute congestive heart failure. Patient admitted to telemetry for diuresis.   Assessment/Plan: Acute respiratory failure with hypoxia Secondary to acute combined systolic and diastolic CHF Holding lasix strict I/O and daily weight.  2-D echo with EF of 40-45% and rate 2 diastolic dysfunction without wall motion abnormality. EF unchanged from previous echo. -patient desats to 80s on RA and ambulation. Needs home o2.   ?Chest pain Seems atypical. EKG shows lateral T wave inversion. Plan for 2 day [place] but then decided for cardiac catheterization  Monday given concern for also positive study in her obesity.  ?OHS/OSA Patient reports having a sleep study several years back but denies ever being on CPAP at home. She will need an outpatient study study. Patient desaturates to 80s on room air and amylase and. Will need home oxygen.  Paroxysmal A. fib In sinus rhythm. On high dose coreg, , Cardizem and Rythmol. Has mild prolonged PR interval.  Her CHADS vasc score is at least  3 and she needs anticoagulation. Will ask cardiology to address this prior to d/c  Essential hypertension Continue home medications. Add ACEi post cath  Chronic kidney disease Cr around 1.3-1.5 baseline since 2015.   Morbid obesity Counseled on weight loss and exercise.she was seen by Dr Redmond Pulling for bariatric surgery ,   Hyperkalemia Ordered kayexalate. Monitor in am  Tobacco abuse Reports to have quit smoking past    DVT prophylaxis  Diet: Heart healthy   Code Status: Full code Family Communication: none at bedside Disposition Plan: for cardiac  cath on monday   Consultants:  none  Procedures:  2 d echo  Antibiotics:  none  HPI/Subjective: Seen and examined. SOB improved.   Objective: Filed Vitals:   01/18/15 1026  BP: 126/68  Pulse: 81  Temp:   Resp:     Intake/Output Summary (Last 24 hours) at 01/18/15 1329 Last data filed at 01/18/15 0825  Gross per 24 hour  Intake   1080 ml  Output   1400 ml  Net   -320 ml   Filed Weights   01/16/15 0705 01/17/15 0536 01/18/15 0647  Weight: 169.509 kg (373 lb 11.2 oz) 170.326 kg (375 lb 8 oz) 169.056 kg (372 lb 11.2 oz)    Exam:   General:  Morbidly obese female in no acute distress  HEENT:  moist oral mucosa, no JVD  Cardiovascular: S1 and S2 normal , no murmurs rub or gallop  Respiratory: Diminished breath sounds bilaterally due to body habitus, no added sounds  Abdomen: Soft, nondistended, nontender,   Musculoskeletal: warm, trace pitting edema bilaterally     Data Reviewed: Basic Metabolic Panel:  Recent Labs Lab 01/14/15 1005 01/15/15 0346 01/16/15 0545 01/17/15 0305 01/18/15 1143  NA 142 139 142 142 140  K 3.2* 5.1 4.0 4.2 5.4*  CL 101 100 102 104 100  CO2 36* 30 33* 33* 30  GLUCOSE 107* 109* 107* 107* 114*  BUN 32* 40* 38* 40* 39*  CREATININE 1.40* 1.66* 1.46* 1.55* 1.47*  CALCIUM 8.6 8.6 9.0 9.0 9.3   Liver Function Tests: No results for input(s): AST, ALT, ALKPHOS, BILITOT, PROT, ALBUMIN in the last 168 hours. No results  for input(s): LIPASE, AMYLASE in the last 168 hours. No results for input(s): AMMONIA in the last 168 hours. CBC:  Recent Labs Lab 01/14/15 1005  WBC 4.9  NEUTROABS 3.2  HGB 10.4*  HCT 33.9*  MCV 97.4  PLT 165   Cardiac Enzymes:  Recent Labs Lab 01/14/15 01/14/15 2315 01/15/15 0346  TROPONINI <0.03 <0.03 <0.03   BNP (last 3 results)  Recent Labs  10/02/14 2211 01/14/15 1005  BNP 322.4* 557.6*    ProBNP (last 3 results) No results for input(s): PROBNP in the last 8760 hours.  CBG: No  results for input(s): GLUCAP in the last 168 hours.  No results found for this or any previous visit (from the past 240 hour(s)).   Studies: No results found.  Scheduled Meds: . carvedilol  50 mg Oral BID  . diltiazem  180 mg Oral Daily  . heparin  5,000 Units Subcutaneous 3 times per day  . LORazepam  2 mg Intravenous Once  . multivitamin with minerals  1 tablet Oral Daily  . omega-3 acid ethyl esters  1 g Oral Daily  . potassium chloride  40 mEq Oral BID  . propafenone  150 mg Oral 3 times per day  . sodium chloride  3 mL Intravenous Q12H   Continuous Infusions:     Time spent: 25 minutes    Paige Marshall, [place]  [facility] Pager [phone] If 7PM-7AM, please contact night-coverage at [email or web address], password [facility] 01/18/2015, 1:29 PM  LOS: 4 days

## 2015-01-19 LAB — BASIC METABOLIC PANEL
ANION GAP: 15 (ref 5–15)
BUN: 37 mg/dL — AB (ref 6–23)
CHLORIDE: 98 mmol/L (ref 96–112)
CO2: 27 mmol/L (ref 19–32)
CREATININE: 1.5 mg/dL — AB (ref 0.50–1.10)
Calcium: 9.1 mg/dL (ref 8.4–10.5)
GFR calc Af Amer: 44 mL/min — ABNORMAL LOW (ref >90)
GFR calc non Af Amer: 38 mL/min — ABNORMAL LOW (ref >90)
GLUCOSE: 111 mg/dL — AB (ref 70–99)
Potassium: 4.2 mmol/L (ref 3.5–5.1)
Sodium: 140 mmol/L (ref 135–145)

## 2015-01-19 MED ORDER — ASPIRIN 81 MG PO CHEW
81.0000 mg | CHEWABLE_TABLET | ORAL | Status: AC
  Administered 2015-01-20: 81 mg via ORAL
  Filled 2015-01-19: qty 1

## 2015-01-19 MED ORDER — SODIUM CHLORIDE 0.9 % IJ SOLN: 3.0000 mL | INTRAMUSCULAR | Status: DC | PRN

## 2015-01-19 MED ORDER — TRAMADOL-ACETAMINOPHEN 37.5-325 MG PO TABS
2.0000 | ORAL_TABLET | Freq: Three times a day (TID) | ORAL | Status: DC | PRN
  Administered 2015-01-19 – 2015-01-21 (×6): 2 via ORAL
  Filled 2015-01-19 (×6): qty 2

## 2015-01-19 MED ORDER — SODIUM CHLORIDE 0.9 % IV SOLN: 250.0000 mL | INTRAVENOUS | Status: DC | PRN

## 2015-01-19 MED ORDER — SODIUM CHLORIDE 0.9 % IJ SOLN
3.0000 mL | Freq: Two times a day (BID) | INTRAMUSCULAR | Status: DC
Start: 2015-01-19 — End: 2015-01-20
  Administered 2015-01-20: 3 mL via INTRAVENOUS

## 2015-01-19 NOTE — Progress Notes (Addendum)
[facility] PROGRESS NOTE  Paige Marshall ZYS:[id number] DOB: 10-Jan-1956 DOA: 01/14/2015 PCP: Paige Perna, NP   brief narrative 59 year old morbidly obese female with history of hypertension, paroxysmal A. fib,, and systolic and diastolic CHF, smoker who presented with shortness of breath for the past 3 days and was found to be in acute congestive heart failure. Patient admitted to telemetry for diuresis.   Assessment/Plan: Acute respiratory failure with hypoxia Secondary to acute combined systolic and diastolic CHF -Holding Lasix with plan on cardiac cath tomorrow. 2-D echo with EF of 40-45% and rate 2 diastolic dysfunction without wall motion abnormality. EF unchanged from previous echo. -patient desats to 80s on RA and ambulation. Needs home o2.   ?Chest pain Seems atypical. EKG shows lateral T wave inversion. Plan for cardiac cath tomorrow.  ?OHS/OSA Patient reports having a sleep study several years back but denies ever being on CPAP at home. She will need an outpatient sleep study. Patient desaturates to 80s on room air and amylase and. Will need home oxygen.  Paroxysmal A. fib In sinus rhythm. On high dose coreg, , Cardizem and Rythmol. Has mild prolonged PR interval.  Her CHADS2 vasc score is at least  3 and she needs anticoagulation. Will ask cardiology to address this prior to d/c  Essential hypertension Continue home medications. Add ACEi post cath  Chronic kidney disease Cr around 1.3-1.5 baseline since 2015.   Morbid obesity Counseled on weight loss and exercise.she was seen by Dr Paige Marshall for bariatric surgery ,   Hyperkalemia Resolved after holding potassium supplement  Tobacco abuse Reports to have quit smoking past    DVT prophylaxis  Diet: Heart healthy/ NP after midnight for cath   Code Status: Full code Family Communication: none at bedside Disposition Plan: Following cardiac cath  tomorrow   Consultants:  none  Procedures:  2 d echo  Antibiotics:  none  HPI/Subjective: Seen and examined. Complains of headache. Denies any chest pain or shortness of breath.  Objective: Filed Vitals:   01/19/15 0500  BP: 150/55  Pulse: 86  Temp: 98.3 F (36.8 C)  Resp: 20    Intake/Output Summary (Last 24 hours) at 01/19/15 1031 Last data filed at 01/19/15 0851  Gross per 24 hour  Intake   2174 ml  Output    600 ml  Net   1574 ml   Filed Weights   01/17/15 0536 01/18/15 0647 01/19/15 0500  Weight: 170.326 kg (375 lb 8 oz) 169.056 kg (372 lb 11.2 oz) 168.6 kg (371 lb 11.1 oz)    Exam:   General:   no acute distress  HEENT:  moist oral mucosa, no JVD  Cardiovascular: S1 and S2 normal , no murmurs rub or gallop  Respiratory: Diminished breath sounds bilaterally due to body habitus, no added sounds  Abdomen: Soft, nondistended, nontender,   Musculoskeletal: warm, no edema     Data Reviewed: Basic Metabolic Panel:  Recent Labs Lab 01/15/15 0346 01/16/15 0545 01/17/15 0305 01/18/15 1143 01/19/15 0608  NA 139 142 142 140 140  K 5.1 4.0 4.2 5.4* 4.2  CL 100 102 104 100 98  CO2 30 33* 33* 30 27  GLUCOSE 109* 107* 107* 114* 111*  BUN 40* 38* 40* 39* 37*  CREATININE 1.66* 1.46* 1.55* 1.47* 1.50*  CALCIUM 8.6 9.0 9.0 9.3 9.1   Liver Function Tests: No results for input(s): AST, ALT, ALKPHOS, BILITOT, PROT, ALBUMIN in the last 168 hours. No results for input(s): LIPASE, AMYLASE in the last  168 hours. No results for input(s): AMMONIA in the last 168 hours. CBC:  Recent Labs Lab 01/14/15 1005  WBC 4.9  NEUTROABS 3.2  HGB 10.4*  HCT 33.9*  MCV 97.4  PLT 165   Cardiac Enzymes:  Recent Labs Lab 01/14/15 01/14/15 2315 01/15/15 0346  TROPONINI <0.03 <0.03 <0.03   BNP (last 3 results)  Recent Labs  10/02/14 2211 01/14/15 1005  BNP 322.4* 557.6*    ProBNP (last 3 results) No results for input(s): PROBNP in the last 8760  hours.  CBG: No results for input(s): GLUCAP in the last 168 hours.  No results found for this or any previous visit (from the past 240 hour(s)).   Studies: No results found.  Scheduled Meds: . carvedilol  50 mg Oral BID  . diltiazem  180 mg Oral Daily  . heparin  5,000 Units Subcutaneous 3 times per day  . LORazepam  2 mg Intravenous Once  . multivitamin with minerals  1 tablet Oral Daily  . omega-3 acid ethyl esters  1 g Oral Daily  . propafenone  150 mg Oral 3 times per day  . sodium chloride  3 mL Intravenous Q12H   Continuous Infusions:     Time spent: 20 minutes    Paige Marshall, [place]  [facility] Pager [phone] If 7PM-7AM, please contact night-coverage at [email or web address], password [facility] 01/19/2015, 10:31 AM  LOS: 5 days

## 2015-01-19 NOTE — Progress Notes (Signed)
Patient continues to refuse CPAP at this time. Patient is aware to have RN contact RT if she changes her mind.

## 2015-01-20 ENCOUNTER — Encounter (HOSPITAL_COMMUNITY): Payer: Self-pay | Admitting: Cardiovascular Disease

## 2015-01-20 ENCOUNTER — Encounter (HOSPITAL_COMMUNITY)
Admission: EM | Disposition: A | Discharge status: Home Health | Payer: Self-pay | Source: Home / Self Care | Attending: Internal Medicine

## 2015-01-20 DIAGNOSIS — I10 Essential (primary) hypertension: Secondary | ICD-10-CM | POA: Diagnosis present

## 2015-01-20 HISTORY — PX: LEFT HEART CATHETERIZATION WITH CORONARY ANGIOGRAM: SHX5451

## 2015-01-20 LAB — BASIC METABOLIC PANEL
Anion gap: 4 — ABNORMAL LOW (ref 5–15)
BUN: 29 mg/dL — AB (ref 6–23)
CO2: 36 mmol/L — ABNORMAL HIGH (ref 19–32)
CREATININE: 1.23 mg/dL — AB (ref 0.50–1.10)
Calcium: 9 mg/dL (ref 8.4–10.5)
Chloride: 100 mmol/L (ref 96–112)
GFR, EST AFRICAN AMERICAN: 56 mL/min — AB (ref >90)
GFR, EST NON AFRICAN AMERICAN: 48 mL/min — AB (ref >90)
Glucose, Bld: 98 mg/dL (ref 70–99)
POTASSIUM: 3.8 mmol/L (ref 3.5–5.1)
Sodium: 140 mmol/L (ref 135–145)

## 2015-01-20 LAB — PROTIME-INR
INR: 1.11 (ref 0.00–1.49)
Prothrombin Time: 14.4 seconds (ref 11.6–15.2)

## 2015-01-20 SURGERY — LEFT HEART CATHETERIZATION WITH CORONARY ANGIOGRAM
Anesthesia: LOCAL

## 2015-01-20 MED ORDER — MIDAZOLAM HCL 2 MG/2ML IJ SOLN
INTRAMUSCULAR | Status: AC
  Filled 2015-01-20: qty 2

## 2015-01-20 MED ORDER — HEPARIN SODIUM (PORCINE) 1000 UNIT/ML IJ SOLN
INTRAMUSCULAR | Status: AC
  Filled 2015-01-20: qty 1

## 2015-01-20 MED ORDER — HYDRALAZINE HCL 20 MG/ML IJ SOLN
10.0000 mg | Freq: Once | INTRAMUSCULAR | Status: AC
  Administered 2015-01-20: 10 mg via INTRAVENOUS
  Filled 2015-01-20: qty 1

## 2015-01-20 MED ORDER — FENTANYL CITRATE 0.05 MG/ML IJ SOLN
INTRAMUSCULAR | Status: AC
  Filled 2015-01-20: qty 2

## 2015-01-20 MED ORDER — ONDANSETRON HCL 4 MG/2ML IJ SOLN
4.0000 mg | Freq: Four times a day (QID) | INTRAMUSCULAR | Status: DC | PRN
Start: 2015-01-20 — End: 2015-01-20

## 2015-01-20 MED ORDER — LISINOPRIL 10 MG PO TABS
10.0000 mg | ORAL_TABLET | Freq: Every day | ORAL | Status: DC
  Filled 2015-01-20: qty 1

## 2015-01-20 MED ORDER — HEPARIN (PORCINE) IN NACL 2-0.9 UNIT/ML-% IJ SOLN
INTRAMUSCULAR | Status: AC
  Filled 2015-01-20: qty 1000

## 2015-01-20 MED ORDER — LIDOCAINE HCL (PF) 1 % IJ SOLN
INTRAMUSCULAR | Status: AC
  Filled 2015-01-20: qty 30

## 2015-01-20 MED ORDER — NITROGLYCERIN 1 MG/10 ML FOR IR/CATH LAB
INTRA_ARTERIAL | Status: AC
  Filled 2015-01-20: qty 10

## 2015-01-20 MED ORDER — SODIUM CHLORIDE 0.9 % IV SOLN
INTRAVENOUS | Status: DC
  Administered 2015-01-20: 16:00:00 via INTRAVENOUS

## 2015-01-20 MED ORDER — SODIUM CHLORIDE 0.9 % IV SOLN
INTRAVENOUS | Status: DC
  Administered 2015-01-20: 13:00:00 via INTRAVENOUS

## 2015-01-20 MED ORDER — FUROSEMIDE 80 MG PO TABS
80.0000 mg | ORAL_TABLET | Freq: Two times a day (BID) | ORAL | Status: DC
  Administered 2015-01-20 – 2015-01-21 (×2): 80 mg via ORAL
  Filled 2015-01-20 (×4): qty 1

## 2015-01-20 MED ORDER — MORPHINE SULFATE 2 MG/ML IJ SOLN: 2.0000 mg | INTRAMUSCULAR | Status: DC | PRN

## 2015-01-20 MED ORDER — ACETAMINOPHEN 325 MG PO TABS
650.0000 mg | ORAL_TABLET | ORAL | Status: DC | PRN
  Administered 2015-01-20: 650 mg via ORAL
  Filled 2015-01-20: qty 2

## 2015-01-20 MED ORDER — ASPIRIN 81 MG PO CHEW
81.0000 mg | CHEWABLE_TABLET | Freq: Every day | ORAL | Status: DC
  Administered 2015-01-21: 81 mg via ORAL
  Filled 2015-01-20: qty 1

## 2015-01-20 NOTE — Progress Notes (Signed)
Called Dr. Clementeen Graham regarding pt BP being elevated. New orders given. Will continue to monitor.

## 2015-01-20 NOTE — CV Procedure (Signed)
JAQUELYNE FIRKUS is a 59 y.o. female    [id number] LOCATION:  FACILITY: [facility]  PHYSICIAN: Quay Burow, M.D. 1955/10/15   DATE OF PROCEDURE:  01/20/2015  DATE OF DISCHARGE:     CARDIAC CATHETERIZATION     History obtained from chart review.Mrs. Duling is a 59 year old morbidly overweight African-American female who was admitted with congestive heart failure and chest pain on 01/14/15. She was treated medically and diuresed. Her EF was in the 40% range by 2-D echo. She presents now for diagnostic coronary arteriography to rule out an ischemic etiology.     PROCEDURE DESCRIPTION:   The patient was brought to the second floor [place]iac cath lab in the postabsorptive state. She was premedicated with Valium 5 mg by mouth, IV Versed and fentanyl. Her right wrist was prepped and shaved in usual sterile fashion. Xylocaine 1% was used for local anesthesia. A 5/6 French sheath was inserted into the right radial artery using standard Seldinger technique. The patient received 8000 units  of heparin  intravenously.  A 5 [place] TIG catheter, right Judkins catheter and the tail catheters were used for selective coronary angiography and left ventriculography respectively. Visipaque is for the entirety of the case. Retrograde aortic, left ventricular and pullback pressures were recorded.    HEMODYNAMICS:    AO SYSTOLIC/AO DIASTOLIC: 093/23   LV SYSTOLIC/LV DIASTOLIC: 557/32  ANGIOGRAPHIC RESULTS:   1. Left main; normal  2. LAD; normal 3. Left circumflex; dominant and normal.  4. Right coronary artery; nondominant and normal 5. Left ventriculography; RAO left ventriculogram was performed using  25 mL of Visipaque dye at 12 mL/second. The overall LVEF estimated  45-50 % Without wall motion abnormalities  IMPRESSION:Mrs. Valeri has normal coronary arteries and low normal LV function. I believe her congestive heart failure is related to diastolic dysfunction. Her LVEDP was  mildly elevated. The sheath was removed and a TR band was placed on the right wrist to achieve patent hemostasis. The patient left the lab is stable condition. She'll be gently hydrated and most likely discharged home later today as per her progress note.  Lorretta Harp MD, [facility] 01/20/2015 3:25 PM

## 2015-01-20 NOTE — H&P (View-Only) (Signed)
Patient Name: Paige Marshall Date of Encounter: 01/20/2015     Principal Problem:   Acute combined systolic and diastolic congestive heart failure Active Problems:   Dyslipidemia   TOBACCO ABUSE   Essential hypertension   PAF (paroxysmal atrial fibrillation)   Morbid obesity   OSA on CPAP   Acute respiratory failure with hypoxia   Renal insufficiency   Hypokalemia   Obesity hypoventilation syndrome   Chest pain    SUBJECTIVE Feels better. No chest pain, palpitation or SOB with ambulation in room. On oxygen.   CURRENT MEDS . carvedilol  50 mg Oral BID  . diltiazem  180 mg Oral Daily  . heparin  5,000 Units Subcutaneous 3 times per day  . LORazepam  2 mg Intravenous Once  . multivitamin with minerals  1 tablet Oral Daily  . omega-3 acid ethyl esters  1 g Oral Daily  . propafenone  150 mg Oral 3 times per day  . sodium chloride  3 mL Intravenous Q12H  . sodium chloride  3 mL Intravenous Q12H    OBJECTIVE  Filed Vitals:   01/19/15 1034 01/19/15 1434 01/19/15 2100 01/20/15 0626  BP: 134/66 110/66 129/89 85/55  Pulse:  64 68 66  Temp:  97.4 F (36.3 C) 98 F (36.7 C) 97.9 F (36.6 C)  TempSrc:  Oral Oral Oral  Resp:  20 20 20   Height:      Weight:    376 lb 1.7 oz (170.6 kg)  SpO2:  97% 100% 96%    Intake/Output Summary (Last 24 hours) at 01/20/15 0911 Last data filed at 01/20/15 0757  Gross per 24 hour  Intake   1600 ml  Output   2200 ml  Net   -600 ml   Filed Weights   01/18/15 0647 01/19/15 0500 01/20/15 0626  Weight: 372 lb 11.2 oz (169.056 kg) 371 lb 11.1 oz (168.6 kg) 376 lb 1.7 oz (170.6 kg)    PHYSICAL EXAM  General: Pleasant, NAD. Neuro: Alert and oriented X 3. Moves all extremities spontaneously. Psych: Normal affect. HEENT:  Normal  Neck: Supple without bruits or JVD. Lungs:  Resp regular and unlabored, CTA  Heart: RRR no s3, s4, or murmurs. Abdomen: Soft, non-tender, non-distended, BS + x 4.  Extremities: No clubbing, cyanosis.  DP pulse intact bilaterally. Trace edema bilaterally.  Accessory Clinical Findings  Basic Metabolic Panel  Recent Labs  01/19/15 0608 01/20/15 0340  NA 140 140  K 4.2 3.8  CL 98 100  CO2 27 36*  GLUCOSE 111* 98  BUN 37* 29*  CREATININE 1.50* 1.23*  CALCIUM 9.1 9.0    TELE NSR with HR in 60s concerning for prolonged PR  Radiology/Studies  Dg Chest 2 View  01/14/2015   CLINICAL DATA:  Increased shortness of breath since Saturday, blurred vision, history asthma, CHF, obesity, hypertension, former smoker  EXAM: CHEST  2 VIEW  COMPARISON:  10/02/2014  FINDINGS: Enlargement of cardiac silhouette with pulmonary vascular congestion.  Atherosclerotic calcification and minimal tortuosity of thoracic aorta.  Slightly increased perihilar markings particularly on RIGHT versus previous exam could reflect minimal pulmonary edema.  Remaining lungs clear.  No pleural effusion or pneumothorax.  No acute osseous findings.  IMPRESSION: Enlargement of cardiac silhouette with pulmonary vascular congestion and question minimal perihilar edema particularly on RIGHT.   Electronically Signed   By: Lavonia Dana M.D.   On: 01/14/2015 12:47    ASSESSMENT AND PLAN  1. Chest pain  - presented with  atypical chest pain. No overnight chest pain. EKG 4/5/216 showed T wave inversion and prolonged PR.  - On cath board today - Cr 1.23  2. Acute on chronic combined systolic and diastolic CHF  - Does not look volume overloaded, however difficult to assess due to morbid obesity.  - Lasix on hold. Cr improved to 1.23  - Net I/O -360 yesterday - ACE or ARB post cath - 2-D echo 01/15/15 with EF of 40-45% and rate 2 diastolic dysfunction without wall motion abnormality.  3. Essential hypertension - well controlled with carvedilol 50mg  BID and diltiazem SR 180 mg a day  4. ATRIAL FIBRILLATION - History of paroxysmal atrial fibrillation maintaining sinus rhythm on Rythmol. Mild prolonged PR interval.  - CHADSVASc  score at least 3 (Sex 1, HTN 1, CHF 1) - Currently on heparin - Needs anticoagulation after cath - On Propafenon 150mg  TID   Judy Pimple PA-C  Pager [phone]   Attending Note:   The patient was seen and examined.  Agree with assessment and plan as noted above.  Changes made to the above note as needed.  Pt is on the cath schedule today. She has had a cath previously.  She may be able to be discharged if cath is ok   Thayer Headings, Brooke Bonito., MD, [facility] 01/20/2015, 11:12 AM [address]  [place] Pager [phone]

## 2015-01-20 NOTE — Progress Notes (Signed)
SATURATION QUALIFICATIONS: (This note is used to comply with regulatory documentation for home oxygen)  Patient Saturations on Room Air at Rest = 87-90%  Patient Saturations on Room Air while Ambulating = 84-87%  Patient Saturations on 2-3 Liters of oxygen while Ambulating = >90% on 2L and >91% on 3LO2  Please briefly explain why patient needs home oxygen:Pt desats on RA at rest and with activity.  Will need home O2.  Thanks. Paige Marshall [phone] (pager)

## 2015-01-20 NOTE — Interval H&P Note (Signed)
Cath Lab Visit (complete for each Cath Lab visit)  Clinical Evaluation Leading to the Procedure:   ACS: Yes.    Non-ACS:    Anginal Classification: CCS IV  Anti-ischemic medical therapy: Minimal Therapy (1 class of medications)  Non-Invasive Test Results: No non-invasive testing performed  Prior CABG: No previous CABG      History and Physical Interval Note:  01/20/2015 3:20 PM  Paige Marshall  has presented today for surgery, with the diagnosis of unstable angina  The various methods of treatment have been discussed with the patient and family. After consideration of risks, benefits and other options for treatment, the patient has consented to  Procedure(s): LEFT HEART CATHETERIZATION WITH CORONARY ANGIOGRAM (N/A) as a surgical intervention .  The patient's history has been reviewed, patient examined, no change in status, stable for surgery.  I have reviewed the patient's chart and labs.  Questions were answered to the patient's satisfaction.     Lorretta Harp

## 2015-01-20 NOTE — Progress Notes (Signed)
Per Angelena Form PA and Dr. Clementeen Graham it is okay to not give 1000 dose of cardizem due to fluctuating HR around 60 bpm.

## 2015-01-20 NOTE — Progress Notes (Signed)
Pt refused to wear CPAP tonight. RT made pt aware that if she changed her mind to call.

## 2015-01-20 NOTE — Progress Notes (Signed)
Patient Name: Paige Marshall Date of Encounter: 01/20/2015     Principal Problem:   Acute combined systolic and diastolic congestive heart failure Active Problems:   Dyslipidemia   TOBACCO ABUSE   Essential hypertension   PAF (paroxysmal atrial fibrillation)   Morbid obesity   OSA on CPAP   Acute respiratory failure with hypoxia   Renal insufficiency   Hypokalemia   Obesity hypoventilation syndrome   Chest pain    SUBJECTIVE Feels better. No chest pain, palpitation or SOB with ambulation in room. On oxygen.   CURRENT MEDS . carvedilol  50 mg Oral BID  . diltiazem  180 mg Oral Daily  . heparin  5,000 Units Subcutaneous 3 times per day  . LORazepam  2 mg Intravenous Once  . multivitamin with minerals  1 tablet Oral Daily  . omega-3 acid ethyl esters  1 g Oral Daily  . propafenone  150 mg Oral 3 times per day  . sodium chloride  3 mL Intravenous Q12H  . sodium chloride  3 mL Intravenous Q12H    OBJECTIVE  Filed Vitals:   01/19/15 1034 01/19/15 1434 01/19/15 2100 01/20/15 0626  BP: 134/66 110/66 129/89 85/55  Pulse:  64 68 66  Temp:  97.4 F (36.3 C) 98 F (36.7 C) 97.9 F (36.6 C)  TempSrc:  Oral Oral Oral  Resp:  20 20 20   Height:      Weight:    376 lb 1.7 oz (170.6 kg)  SpO2:  97% 100% 96%    Intake/Output Summary (Last 24 hours) at 01/20/15 0911 Last data filed at 01/20/15 0757  Gross per 24 hour  Intake   1600 ml  Output   2200 ml  Net   -600 ml   Filed Weights   01/18/15 0647 01/19/15 0500 01/20/15 0626  Weight: 372 lb 11.2 oz (169.056 kg) 371 lb 11.1 oz (168.6 kg) 376 lb 1.7 oz (170.6 kg)    PHYSICAL EXAM  General: Pleasant, NAD. Neuro: Alert and oriented X 3. Moves all extremities spontaneously. Psych: Normal affect. HEENT:  Normal  Neck: Supple without bruits or JVD. Lungs:  Resp regular and unlabored, CTA  Heart: RRR no s3, s4, or murmurs. Abdomen: Soft, non-tender, non-distended, BS + x 4.  Extremities: No clubbing, cyanosis.  DP pulse intact bilaterally. Trace edema bilaterally.  Accessory Clinical Findings  Basic Metabolic Panel  Recent Labs  01/19/15 0608 01/20/15 0340  NA 140 140  K 4.2 3.8  CL 98 100  CO2 27 36*  GLUCOSE 111* 98  BUN 37* 29*  CREATININE 1.50* 1.23*  CALCIUM 9.1 9.0    TELE NSR with HR in 60s concerning for prolonged PR  Radiology/Studies  Dg Chest 2 View  01/14/2015   CLINICAL DATA:  Increased shortness of breath since Saturday, blurred vision, history asthma, CHF, obesity, hypertension, former smoker  EXAM: CHEST  2 VIEW  COMPARISON:  10/02/2014  FINDINGS: Enlargement of cardiac silhouette with pulmonary vascular congestion.  Atherosclerotic calcification and minimal tortuosity of thoracic aorta.  Slightly increased perihilar markings particularly on RIGHT versus previous exam could reflect minimal pulmonary edema.  Remaining lungs clear.  No pleural effusion or pneumothorax.  No acute osseous findings.  IMPRESSION: Enlargement of cardiac silhouette with pulmonary vascular congestion and question minimal perihilar edema particularly on RIGHT.   Electronically Signed   By: Lavonia Dana M.D.   On: 01/14/2015 12:47    ASSESSMENT AND PLAN  1. Chest pain  - presented with  atypical chest pain. No overnight chest pain. EKG 4/5/216 showed T wave inversion and prolonged PR.  - On cath board today - Cr 1.23  2. Acute on chronic combined systolic and diastolic CHF  - Does not look volume overloaded, however difficult to assess due to morbid obesity.  - Lasix on hold. Cr improved to 1.23  - Net I/O -360 yesterday - ACE or ARB post cath - 2-D echo 01/15/15 with EF of 40-45% and rate 2 diastolic dysfunction without wall motion abnormality.  3. Essential hypertension - well controlled with carvedilol 50mg  BID and diltiazem SR 180 mg a day  4. ATRIAL FIBRILLATION - History of paroxysmal atrial fibrillation maintaining sinus rhythm on Rythmol. Mild prolonged PR interval.  - CHADSVASc  score at least 3 (Sex 1, HTN 1, CHF 1) - Currently on heparin - Needs anticoagulation after cath - On Propafenon 150mg  TID   Judy Pimple PA-C  Pager [phone]   Attending Note:   The patient was seen and examined.  Agree with assessment and plan as noted above.  Changes made to the above note as needed.  Pt is on the cath schedule today. She has had a cath previously.  She may be able to be discharged if cath is ok   Thayer Headings, Brooke Bonito., MD, [facility] 01/20/2015, 11:12 AM [address]  [place] Pager [phone]

## 2015-01-20 NOTE — Progress Notes (Addendum)
Physical Therapy Treatment Patient Details Name: Paige Marshall MRN: [id number] DOB: 07/17/1956 Today's Date: 01/20/2015    History of Present Illness 59 year old morbidly obese female with history of hypertension, paroxysmal A. fib,, and systolic and diastolic CHF, smoker who presented with shortness of breath for the past 3 days and was found to be in acute congestive heart failure. Patient admitted to telemetry for diuresis.    PT Comments    Pt admitted with above diagnosis. Pt currently with functional limitations due to endurance and strength deficits. Pt progressing increasing ambulation distance.  Continues to need O2 with ambulation and at rest to keep sats above 90%.  2/2 Goals initially set met.  Goals reset today. Pt will benefit from skilled PT to increase their independence and safety with mobility to allow discharge to the venue listed below.    Follow Up Recommendations  Home health PT;Supervision/Assistance - 24 hour [facility])     Equipment Recommendations  Rolling walker with 5" wheels;Other (comment) (home O2)    Recommendations for Other Services       Precautions / Restrictions Precautions Precautions: Fall Restrictions Weight Bearing Restrictions: No    Mobility  Bed Mobility               General bed mobility comments: sitting on EOB on arrival.    Transfers Overall transfer level: Needs assistance   Transfers: Sit to/from Stand Sit to Stand: Supervision         General transfer comment: Pt uses momentum with wide BOS to get up.  No cues needed as she has a technique and uses proper hand placement.    Ambulation/Gait Ambulation/Gait assistance: Min guard Ambulation Distance (Feet): 200 Feet Assistive device: Rolling walker (2 wheeled) Gait Pattern/deviations: Step-through pattern;Decreased stride length;Decreased step length - right;Decreased step length - left;Trunk flexed;Drifts right/left;Antalgic   Gait velocity interpretation:  Below normal speed for age/gender General Gait Details: Pt on 2LO2 on arrival with O2 at 92-93%.  Pt sats registering at 87-91% at rest on arrival on RA.  Ambulated pt on RA and sats 84-87%.  Once O2 in place, sats at 90% and > on 2L and 91% and > on 3L.  Pt ambulating better overall today, still with flexed posture but this is pts baseline.  Pt also took several standing rest breaks.  Reviewed incentive spirometer with pt.     Stairs            Wheelchair Mobility    Modified Rankin (Stroke Patients Only)       Balance Overall balance assessment: Needs assistance Sitting-balance support: No upper extremity supported;Feet supported Sitting balance-Leahy Scale: Fair     Standing balance support: Bilateral upper extremity supported;During functional activity Standing balance-Leahy Scale: Poor Standing balance comment: Relies on RW for balance and stability.               High level balance activites: Direction changes;Turns;Sudden stops High Level Balance Comments: supervision for ambulation.      Cognition Arousal/Alertness: Awake/alert Behavior During Therapy: WFL for tasks assessed/performed Overall Cognitive Status: Within Functional Limits for tasks assessed                      Exercises General Exercises - Lower Extremity Ankle Circles/Pumps: AROM;Both;10 reps;Standing Short Arc Quad: AROM;Both;10 reps;Supine Hip ABduction/ADduction: AROM;Both;10 reps;Standing Straight Leg Raises: AROM;Both;10 reps;Supine Hip Flexion/Marching: AROM;Both;10 reps;Standing Toe Raises: AROM;Both;10 reps;Standing Other Exercises Other Exercises: Gave pt home exercise program for breathing exercises.  Shoulder hunches,  diaphragmatic breathing, shoulder shrugs, deep breathing, elevating rib cage, neck rotation.   Other Exercises: Pt also had hip extensionn exercise on program in standing.     General Comments        Pertinent Vitals/Pain Pain Assessment: No/denies pain      SATURATION QUALIFICATIONS: (This note is used to comply with regulatory documentation for home oxygen)  Patient Saturations on Room Air at Rest = 87-90%  Patient Saturations on Room Air while Ambulating = 84-87%  Patient Saturations on 2-3 Liters of oxygen while Ambulating = >90% on 2L and >91% on 3LO2  Please briefly explain why patient needs home oxygen:Pt desats on RA at rest and with activity.  Will need home O2 Home Living                      Prior Function            PT Goals (current goals can now be found in the care plan section) Acute Rehab PT Goals Patient Stated Goal: to go home PT Goal Formulation: With patient Time For Goal Achievement: 01/27/15 Potential to Achieve Goals: Good Progress towards PT goals: Progressing toward goals    Frequency  Min 3X/week    PT Plan Current plan remains appropriate    Co-evaluation             End of Session Equipment Utilized During Treatment: Gait belt;Oxygen Activity Tolerance: Patient limited by fatigue Patient left: in bed;with call bell/phone within reach;with family/visitor present     Time: 5183-3582 PT Time Calculation (min) (ACUTE ONLY): 43 min  Charges:  $Gait Training: 8-22 mins $Therapeutic Exercise: 8-22 mins $Self Care/Home Management: 8-22                    G Codes:      WhiteArrie Aran F 08-Feb-2015, 11:02 AM  Keneth Borg,PT Acute Rehabilitation [phone] (pager)

## 2015-01-20 NOTE — Progress Notes (Signed)
1850 TR band removed. Site now level 0. Pt instructed to not use R arm. Restricted extremity band applied.

## 2015-01-20 NOTE — Progress Notes (Signed)
[facility] PROGRESS NOTE  Paige Marshall JOI:[id number] DOB: [birth year] DOA: 01/14/2015 PCP: Kerin Perna, NP   brief narrative 59 year old morbidly obese female with history of hypertension, paroxysmal A. fib,, and systolic and diastolic CHF, smoker who presented with shortness of breath for the past 3 days and was found to be in acute congestive heart failure   Assessment/Plan: Acute respiratory failure with hypoxia  Secondary to acute combined systolic and diastolic CHF 2-D echo with EF of 40-45% and rate 2 diastolic dysfunction without wall motion abnormality. EF unchanged from previous echo. -patient desats to 80s on RA and ambulation. Needs home o2.     ?Chest pain -atypical. EKG shows lateral T wave inversion. -cardiac cath with clean coronaries and EF of 45-50%.  ?OHS/OSA Patient reports having a sleep study several years back but denies ever being on CPAP at home. She will need an outpatient sleep study. Patient desaturates to 80s on room air and amylase and. Will need home oxygen.  Paroxysmal A. fib In sinus rhythm. On high dose coreg, , Cardizem and Rythmol. Has mild prolonged PR interval.  Her CHADS2 vasc score is at least  3 and she needs anticoagulation. Cardiology recommends Anticoagulation. Spoke with Dr Gwenlyn Found who agrees to start her on [facility] from tomorrow. Will d/c on eliquis. Role of anticoagulation including benefits and side effects discussed with pt in detail.    Essential hypertension Elevated BP post cath. Given a dose of  IV hydralazine. BP low normal this am. Continue home medications. Add ACEi post cath  Chronic kidney disease Cr around 1.3-1.5 baseline since 2015.   Morbid obesity Counseled on weight loss and exercise.she was seen by Dr Redmond Pulling for bariatric surgery ,   Hyperkalemia Resolved after holding potassium supplement  Tobacco abuse Reports to have quit smoking past    DVT prophylaxis  Diet: Heart healthy/   Code  Status: Full code Family Communication: husband  at bedside Disposition Plan: home in am if BP stable   Consultants:  none  Procedures:  2 d echo  Cardiac cath on 4/11  Antibiotics:  none  HPI/Subjective: Seen and examined . Cardiac cath done and tolerated well. Denies SOB.  Objective: Filed Vitals:   01/20/15 1540  BP: 161/91  Pulse: 70  Temp:   Resp:     Intake/Output Summary (Last 24 hours) at 01/20/15 1555 Last data filed at 01/20/15 1131  Gross per 24 hour  Intake    640 ml  Output   1650 ml  Net  -1010 ml   Filed Weights   01/18/15 0647 01/19/15 0500 01/20/15 0626  Weight: 169.056 kg (372 lb 11.2 oz) 168.6 kg (371 lb 11.1 oz) 170.6 kg (376 lb 1.7 oz)    Exam:   General:   no acute distress  HEENT:  moist oral mucosa, no JVD  Cardiovascular: S1 and S2 normal , no murmurs rub or gallop  Respiratory: Diminished breath sounds bilaterally due to body habitus, no added sounds  Abdomen: Soft, nondistended, nontender, rt radail cath site is clean  Musculoskeletal: warm, no edema     Data Reviewed: Basic Metabolic Panel:  Recent Labs Lab 01/16/15 0545 01/17/15 0305 01/18/15 1143 01/19/15 0608 01/20/15 0340  NA 142 142 140 140 140  K 4.0 4.2 5.4* 4.2 3.8  CL 102 104 100 98 100  CO2 33* 33* 30 27 36*  GLUCOSE 107* 107* 114* 111* 98  BUN 38* 40* 39* 37* 29*  CREATININE 1.46* 1.55* 1.47* 1.50* 1.23*  CALCIUM 9.0 9.0 9.3 9.1 9.0   Liver Function Tests: No results for input(s): AST, ALT, ALKPHOS, BILITOT, PROT, ALBUMIN in the last 168 hours. No results for input(s): LIPASE, AMYLASE in the last 168 hours. No results for input(s): AMMONIA in the last 168 hours. CBC:  Recent Labs Lab 01/14/15 1005  WBC 4.9  NEUTROABS 3.2  HGB 10.4*  HCT 33.9*  MCV 97.4  PLT 165   Cardiac Enzymes:  Recent Labs Lab 01/14/15 01/14/15 2315 01/15/15 0346  TROPONINI <0.03 <0.03 <0.03   BNP (last 3 results)  Recent Labs  10/02/14 2211  01/14/15 1005  BNP 322.4* 557.6*    ProBNP (last 3 results) No results for input(s): PROBNP in the last 8760 hours.  CBG: No results for input(s): GLUCAP in the last 168 hours.  No results found for this or any previous visit (from the past 240 hour(s)).   Studies: No results found.  Scheduled Meds: . [START ON 01/21/2015] aspirin  81 mg Oral Daily  . carvedilol  50 mg Oral BID  . diltiazem  180 mg Oral Daily  . heparin  5,000 Units Subcutaneous 3 times per day  . hydrALAZINE  10 mg Intravenous Once  . LORazepam  2 mg Intravenous Once  . multivitamin with minerals  1 tablet Oral Daily  . omega-3 acid ethyl esters  1 g Oral Daily  . propafenone  150 mg Oral 3 times per day  . sodium chloride  3 mL Intravenous Q12H   Continuous Infusions: . sodium chloride        Time spent: 25 minutes    Aysia Lowder, [place]  [facility] Pager [phone] If 7PM-7AM, please contact night-coverage at [email or web address], password [email or web address] 01/20/2015, 3:55 PM  LOS: 6 days

## 2015-01-21 DIAGNOSIS — I130 Hypertensive heart and chronic kidney disease with heart failure and stage 1 through stage 4 chronic kidney disease, or unspecified chronic kidney disease: Secondary | ICD-10-CM

## 2015-01-21 DIAGNOSIS — N183 Chronic kidney disease, stage 3 unspecified: Secondary | ICD-10-CM

## 2015-01-21 DIAGNOSIS — E876 Hypokalemia: Secondary | ICD-10-CM

## 2015-01-21 LAB — BASIC METABOLIC PANEL
Anion gap: 5 (ref 5–15)
BUN: 26 mg/dL — AB (ref 6–23)
CO2: 35 mmol/L — ABNORMAL HIGH (ref 19–32)
CREATININE: 1.27 mg/dL — AB (ref 0.50–1.10)
Calcium: 9.1 mg/dL (ref 8.4–10.5)
Chloride: 101 mmol/L (ref 96–112)
GFR calc Af Amer: 54 mL/min — ABNORMAL LOW (ref >90)
GFR, EST NON AFRICAN AMERICAN: 46 mL/min — AB (ref >90)
Glucose, Bld: 110 mg/dL — ABNORMAL HIGH (ref 70–99)
POTASSIUM: 4.1 mmol/L (ref 3.5–5.1)
Sodium: 141 mmol/L (ref 135–145)

## 2015-01-21 MED ORDER — ALLOPURINOL 300 MG PO TABS: 300.0000 mg | ORAL_TABLET | Freq: Every day | ORAL | Status: DC

## 2015-01-21 MED ORDER — APIXABAN 5 MG PO TABS
5.0000 mg | ORAL_TABLET | Freq: Two times a day (BID) | ORAL | Status: DC
  Administered 2015-01-21: 5 mg via ORAL
  Filled 2015-01-21 (×2): qty 1

## 2015-01-21 MED ORDER — APIXABAN 5 MG PO TABS: 5.0000 mg | ORAL_TABLET | Freq: Two times a day (BID) | ORAL | Status: DC

## 2015-01-21 MED ORDER — TRAMADOL-ACETAMINOPHEN 37.5-325 MG PO TABS: 2.0000 | ORAL_TABLET | Freq: Three times a day (TID) | ORAL | Status: DC | PRN

## 2015-01-21 MED ORDER — LISINOPRIL 20 MG PO TABS: 20.0000 mg | ORAL_TABLET | Freq: Every day | ORAL | Status: DC

## 2015-01-21 MED ORDER — LISINOPRIL 20 MG PO TABS
20.0000 mg | ORAL_TABLET | Freq: Every day | ORAL | Status: DC
Start: 2015-01-21 — End: 2015-01-21
  Administered 2015-01-21: 20 mg via ORAL
  Filled 2015-01-21: qty 1

## 2015-01-21 NOTE — Progress Notes (Signed)
ANTICOAGULATION CONSULT NOTE - Initial Consult  Pharmacy Consult for Apixaban  Indication:  Nonvalvular Atrial Fibrillation Allergies  Allergen Reactions  . Penicillins Hives, Itching, Swelling and Rash    Patient Measurements: Height: 5' 7.5" (171.5 cm) Weight: (!) 372 lb 14.4 oz (169.146 kg) (scale c) IBW/kg (Calculated) : 62.75   Vital Signs: Temp: 98.1 F (36.7 C) (04/12 0520) Temp Source: Oral (04/12 0520) BP: 143/76 mmHg (04/12 0520) Pulse Rate: 61 (04/12 0520)  Labs:  Recent Labs  01/19/15 0608 01/20/15 0340 01/20/15 1814 01/21/15 0505  LABPROT  --   --  14.4  --   INR  --   --  1.11  --   CREATININE 1.50* 1.23*  --  1.27*    Estimated Creatinine Clearance: 82.2 mL/min (by C-G formula based on Cr of 1.27).   Medical History: Past Medical History  Diagnosis Date  . CHF (congestive heart failure)   . Hypertension   . Paroxysmal atrial fibrillation   . High cholesterol   . Former smoker   . Dyspnea     progressive  . Edema     Lower extremity  . Coronary artery disease   . Asthma   . Pneumonia X 2  . OSA (obstructive sleep apnea)     "never sent CPAP out" (01/14/2015)  . Arthritis     "both knees" (01/14/2015)  . Degenerative joint disease of both lower legs   . Gout     hx in "both knees" (01/14/2015)    Medications:  Scheduled:  . aspirin  81 mg Oral Daily  . carvedilol  50 mg Oral BID  . diltiazem  180 mg Oral Daily  . furosemide  80 mg Oral BID  . lisinopril  20 mg Oral Daily  . LORazepam  2 mg Intravenous Once  . multivitamin with minerals  1 tablet Oral Daily  . omega-3 acid ethyl esters  1 g Oral Daily  . propafenone  150 mg Oral 3 times per day  . sodium chloride  3 mL Intravenous Q12H    Assessment: 59 year old morbidly obese female with history of hypertension, paroxysmal A. fib,, and systolic and diastolic CHF, smoker who presented with shortness of breath for the past 3 days and was found to be in acute congestive heart  failure Pharmacy consulted to dose apixaban for Nonvalvular Atrial Fibrillation SCR 1.27 , Crcl 82 ml/min, wt = 169 kg  INR =1.11  Goal of Therapy:  Monitor platelets by anticoagulation protocol: Yes   Plan:  apixaban 5mg  po bid DC the SQ heparin  Nicole Cella, RPh Clinical Pharmacist Pager: [phone] 01/21/2015,7:30 AM

## 2015-01-21 NOTE — Progress Notes (Signed)
Pt has orders to be discharged. Discharge instructions given and pt has no additional questions at this time. IV removed and site in good condition. Pt stable and waiting to be wheeled out.  Maurene Capes RN

## 2015-01-21 NOTE — Discharge Instructions (Signed)
Heart Failure °Heart failure is a condition in which the heart has trouble pumping blood. This means your heart does not pump blood efficiently for your body to work well. In some cases of heart failure, fluid may back up into your lungs or you may have swelling (edema) in your lower legs. Heart failure is usually a long-term (chronic) condition. It is important for you to take good care of yourself and follow your health care provider's treatment plan. °CAUSES  °Some health conditions can cause heart failure. Those health conditions include: °· High blood pressure (hypertension). Hypertension causes the heart muscle to work harder than normal. When pressure in the blood vessels is high, the heart needs to pump (contract) with more force in order to circulate blood throughout the body. High blood pressure eventually causes the heart to become stiff and weak. °· Coronary artery disease (CAD). CAD is the buildup of cholesterol and fat (plaque) in the arteries of the heart. The blockage in the arteries deprives the heart muscle of oxygen and blood. This can cause chest pain and may lead to a heart attack. High blood pressure can also contribute to CAD. °· Heart attack (myocardial infarction). A heart attack occurs when one or more arteries in the heart become blocked. The loss of oxygen damages the muscle tissue of the heart. When this happens, part of the heart muscle dies. The injured tissue does not contract as well and weakens the heart's ability to pump blood. °· Abnormal heart valves. When the heart valves do not open and close properly, it can cause heart failure. This makes the heart muscle pump harder to keep the blood flowing. °· Heart muscle disease (cardiomyopathy or myocarditis). Heart muscle disease is damage to the heart muscle from a variety of causes. These can include drug or alcohol abuse, infections, or unknown reasons. These can increase the risk of heart failure. °· Lung disease. Lung disease  makes the heart work harder because the lungs do not work properly. This can cause a strain on the heart, leading it to fail. °· Diabetes. Diabetes increases the risk of heart failure. High blood sugar contributes to high fat (lipid) levels in the blood. Diabetes can also cause slow damage to tiny blood vessels that carry important nutrients to the heart muscle. When the heart does not get enough oxygen and food, it can cause the heart to become weak and stiff. This leads to a heart that does not contract efficiently. °· Other conditions can contribute to heart failure. These include abnormal heart rhythms, thyroid problems, and low blood counts (anemia). °Certain unhealthy behaviors can increase the risk of heart failure, including: °· Being overweight. °· Smoking or chewing tobacco. °· Eating foods high in fat and cholesterol. °· Abusing illicit drugs or alcohol. °· Lacking physical activity. °SYMPTOMS  °Heart failure symptoms may vary and can be hard to detect. Symptoms may include: °· Shortness of breath with activity, such as climbing stairs. °· Persistent cough. °· Swelling of the feet, ankles, legs, or abdomen. °· Unexplained weight gain. °· Difficulty breathing when lying flat (orthopnea). °· Waking from sleep because of the need to sit up and get more air. °· Rapid heartbeat. °· Fatigue and loss of energy. °· Feeling light-headed, dizzy, or close to fainting. °· Loss of appetite. °· Nausea. °· Increased urination during the night (nocturia). °DIAGNOSIS  °A diagnosis of heart failure is based on your history, symptoms, physical examination, and diagnostic tests. Diagnostic tests for heart failure may include: °·   Echocardiography. °· Electrocardiography. °· Chest X-ray. °· Blood tests. °· Exercise stress test. °· Cardiac angiography. °· Radionuclide scans. °TREATMENT  °Treatment is aimed at managing the symptoms of heart failure. Medicines, behavioral changes, or surgical intervention may be necessary to  treat heart failure. °· Medicines to help treat heart failure may include: °¨ Angiotensin-converting enzyme (ACE) inhibitors. This type of medicine blocks the effects of a blood protein called angiotensin-converting enzyme. ACE inhibitors relax (dilate) the blood vessels and help lower blood pressure. °¨ Angiotensin receptor blockers (ARBs). This type of medicine blocks the actions of a blood protein called angiotensin. Angiotensin receptor blockers dilate the blood vessels and help lower blood pressure. °¨ Water pills (diuretics). Diuretics cause the kidneys to remove salt and water from the blood. The extra fluid is removed through urination. This loss of extra fluid lowers the volume of blood the heart pumps. °¨ Beta blockers. These prevent the heart from beating too fast and improve heart muscle strength. °¨ Digitalis. This increases the force of the heartbeat. °· Healthy behavior changes include: °¨ Obtaining and maintaining a healthy weight. °¨ Stopping smoking or chewing tobacco. °¨ Eating heart-healthy foods. °¨ Limiting or avoiding alcohol. °¨ Stopping illicit drug use. °¨ Physical activity as directed by your health care provider. °· Surgical treatment for heart failure may include: °¨ A procedure to open blocked arteries, repair damaged heart valves, or remove damaged heart muscle tissue. °¨ A pacemaker to improve heart muscle function and control certain abnormal heart rhythms. °¨ An internal cardioverter defibrillator to treat certain serious abnormal heart rhythms. °¨ A left ventricular assist device (LVAD) to assist the pumping ability of the heart. °HOME CARE INSTRUCTIONS  °· Take medicines only as directed by your health care provider. Medicines are important in reducing the workload of your heart, slowing the progression of heart failure, and improving your symptoms. °¨ Do not stop taking your medicine unless directed by your health care provider. °¨ Do not skip any dose of medicine. °¨ Refill your  prescriptions before you run out of medicine. Your medicines are needed every day. °· Engage in moderate physical activity if directed by your health care provider. Moderate physical activity can benefit some people. The elderly and people with severe heart failure should consult with a health care provider for physical activity recommendations. °· Eat heart-healthy foods. Food choices should be free of trans fat and low in saturated fat, cholesterol, and salt (sodium). Healthy choices include fresh or frozen fruits and vegetables, fish, lean meats, legumes, fat-free or low-fat dairy products, and whole grain or high fiber foods. Talk to a dietitian to learn more about heart-healthy foods. °· Limit sodium if directed by your health care provider. Sodium restriction may reduce symptoms of heart failure in some people. Talk to a dietitian to learn more about heart-healthy seasonings. °· Use healthy cooking methods. Healthy cooking methods include roasting, grilling, broiling, baking, poaching, steaming, or stir-frying. Talk to a dietitian to learn more about healthy cooking methods. °· Limit fluids if directed by your health care provider. Fluid restriction may reduce symptoms of heart failure in some people. °· Weigh yourself every day. Daily weights are important in the early recognition of excess fluid. You should weigh yourself every morning after you urinate and before you eat breakfast. Wear the same amount of clothing each time you weigh yourself. Record your daily weight. Provide your health care provider with your weight record. °· Monitor and record your blood pressure if directed by your health care   provider.  Check your pulse if directed by your health care provider.  Lose weight if directed by your health care provider. Weight loss may reduce symptoms of heart failure in some people.  Stop smoking or chewing tobacco. Nicotine makes your heart work harder by causing your blood vessels to constrict.  Do not use nicotine gum or patches before talking to your health care provider.  Keep all follow-up visits as directed by your health care provider. This is important.  Limit alcohol intake to no more than 1 drink per day for nonpregnant women and 2 drinks per day for men. One drink equals 12 ounces of beer, 5 ounces of wine, or 1 ounces of hard liquor. Drinking more than that is harmful to your heart. Tell your health care provider if you drink alcohol several times a week. Talk with your health care provider about whether alcohol is safe for you. If your heart has already been damaged by alcohol or you have severe heart failure, drinking alcohol should be stopped completely.  Stop illicit drug use.  Stay up-to-date with immunizations. It is especially important to prevent respiratory infections through current pneumococcal and influenza immunizations.  Manage other health conditions such as hypertension, diabetes, thyroid disease, or abnormal heart rhythms as directed by your health care provider.  Learn to manage stress.  Plan rest periods when fatigued.  Learn strategies to manage high temperatures. If the weather is extremely hot:  Avoid vigorous physical activity.  Use air conditioning or fans or seek a cooler location.  Avoid caffeine and alcohol.  Wear loose-fitting, lightweight, and light-colored clothing.  Learn strategies to manage cold temperatures. If the weather is extremely cold:  Avoid vigorous physical activity.  Layer clothes.  Wear mittens or gloves, a hat, and a scarf when going outside.  Avoid alcohol.  Obtain ongoing education and support as needed.  Participate in or seek rehabilitation as needed to maintain or improve independence and quality of life. SEEK MEDICAL CARE IF:   Your weight increases by 03 lb/1.4 kg in 1 day or 05 lb/2.3 kg in a week.  You have increasing shortness of breath that is unusual for you.  You are unable to participate in  your usual physical activities.  You tire easily.  You cough more than normal, especially with physical activity.  You have any or more swelling in areas such as your hands, feet, ankles, or abdomen.  You are unable to sleep because it is hard to breathe.  You feel like your heart is beating fast (palpitations).  You become dizzy or light-headed upon standing up. SEEK IMMEDIATE MEDICAL CARE IF:   You have difficulty breathing.  There is a change in mental status such as decreased alertness or difficulty with concentration.  You have a pain or discomfort in your chest.  You have an episode of fainting (syncope). MAKE SURE YOU:   Understand these instructions.  Will watch your condition.  Will get help right away if you are not doing well or get worse. Document Released: 09/27/2005 Document Revised: 02/11/2014 Document Reviewed: 10/27/2012 [facility] Patient Information 2015 [place], [place]. This information is not intended to replace advice given to you by your health care provider. Make sure you discuss any questions you have with your health care provider.   DASH Eating Plan DASH stands for "Dietary Approaches to Stop Hypertension." The DASH eating plan is a healthy eating plan that has been shown to reduce high blood pressure (hypertension). Additional health benefits may include  reducing the risk of type 2 diabetes mellitus, heart disease, and stroke. The DASH eating plan may also help with weight loss. WHAT DO I NEED TO KNOW ABOUT THE DASH EATING PLAN? For the DASH eating plan, you will follow these general guidelines:  Choose foods with a percent daily value for sodium of less than 5% (as listed on the food label).  Use salt-free seasonings or herbs instead of table salt or sea salt.  Check with your health care provider or pharmacist before using salt substitutes.  Eat lower-sodium products, often labeled as "lower sodium" or "no salt added."  Eat fresh foods.  Eat  more vegetables, fruits, and low-fat dairy products.  Choose whole grains. Look for the word "whole" as the first word in the ingredient list.  Choose fish and skinless chicken or [place] more often than red meat. Limit fish, poultry, and meat to 6 oz (170 g) each day.  Limit sweets, desserts, sugars, and sugary drinks.  Choose heart-healthy fats.  Limit cheese to 1 oz (28 g) per day.  Eat more home-cooked food and less restaurant, buffet, and fast food.  Limit fried foods.  Cook foods using methods other than frying.  Limit canned vegetables. If you do use them, rinse them well to decrease the sodium.  When eating at a restaurant, ask that your food be prepared with less salt, or no salt if possible. WHAT FOODS CAN I EAT? Seek help from a dietitian for individual calorie needs. Grains Whole grain or whole wheat bread. Brown rice. Whole grain or whole wheat pasta. Quinoa, bulgur, and whole grain cereals. Low-sodium cereals. Corn or whole wheat flour tortillas. Whole grain cornbread. Whole grain crackers. Low-sodium crackers. Vegetables Fresh or frozen vegetables (raw, steamed, roasted, or grilled). Low-sodium or reduced-sodium tomato and vegetable juices. Low-sodium or reduced-sodium tomato sauce and paste. Low-sodium or reduced-sodium canned vegetables.  Fruits All fresh, canned (in natural juice), or frozen fruits. Meat and Other Protein Products Ground beef (85% or leaner), grass-fed beef, or beef trimmed of fat. Skinless chicken or [place]. Ground chicken or [place]. Pork trimmed of fat. All fish and seafood. Eggs. Dried beans, peas, or lentils. Unsalted nuts and seeds. Unsalted canned beans. Dairy Low-fat dairy products, such as skim or 1% milk, 2% or reduced-fat cheeses, low-fat ricotta or cottage cheese, or plain low-fat yogurt. Low-sodium or reduced-sodium cheeses. Fats and Oils Tub margarines without trans fats. Light or reduced-fat mayonnaise and salad dressings (reduced  sodium). Avocado. Safflower, olive, or canola oils. Natural peanut or almond butter. Other Unsalted popcorn and pretzels. The items listed above may not be a complete list of recommended foods or beverages. Contact your dietitian for more options. WHAT FOODS ARE NOT RECOMMENDED? Grains White bread. White pasta. White rice. Refined cornbread. Bagels and croissants. Crackers that contain trans fat. Vegetables Creamed or fried vegetables. Vegetables in a cheese sauce. Regular canned vegetables. Regular canned tomato sauce and paste. Regular tomato and vegetable juices. Fruits Dried fruits. Canned fruit in light or heavy syrup. Fruit juice. Meat and Other Protein Products Fatty cuts of meat. Ribs, chicken wings, bacon, sausage, bologna, salami, chitterlings, fatback, hot dogs, bratwurst, and packaged luncheon meats. Salted nuts and seeds. Canned beans with salt. Dairy Whole or 2% milk, cream, half-and-half, and cream cheese. Whole-fat or sweetened yogurt. Full-fat cheeses or blue cheese. Nondairy creamers and whipped toppings. Processed cheese, cheese spreads, or cheese curds. Condiments Onion and garlic salt, seasoned salt, table salt, and sea salt. Canned and packaged gravies. Worcestershire sauce.  Tartar sauce. Barbecue sauce. Teriyaki sauce. Soy sauce, including reduced sodium. Steak sauce. Fish sauce. Oyster sauce. Cocktail sauce. Horseradish. Ketchup and mustard. Meat flavorings and tenderizers. Bouillon cubes. Hot sauce. Tabasco sauce. Marinades. Taco seasonings. Relishes. Fats and Oils Butter, stick margarine, lard, shortening, ghee, and bacon fat. Coconut, palm kernel, or palm oils. Regular salad dressings. Other Pickles and olives. Salted popcorn and pretzels. The items listed above may not be a complete list of foods and beverages to avoid. Contact your dietitian for more information. WHERE CAN I FIND MORE INFORMATION? National Heart, Lung, and Blood Institute:  travelstabl[employer].com Document Released: 09/16/2011 Document Revised: 02/11/2014 Document Reviewed: 08/01/2013 [facility] Patient Information 2015 [place], [place]. This information is not intended to replace advice given to you by your health care provider. Make sure you discuss any questions you have with your health care provider.

## 2015-01-21 NOTE — Discharge Summary (Addendum)
Physician Discharge Summary  Paige Marshall:[id number] DOB: 1955/12/27 DOA: 01/14/2015  PCP: Kerin Perna, NP  Admit date: 01/14/2015 Discharge date: 01/21/2015  Time spent: 35 minutes  Recommendations for Outpatient Follow-up:  1. discharge home with [facility]. Needs home o2 continuously 2. patient will be dischargedon eliquis for Afib 3.  follow up with PCP and cardiology as otpt 4. Referral for outpt sleep study has  been made. 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 fib,, and systolic and diastolic CHF, smoker who presented with shortness of breath for the past 3 days and was found to be in acute congestive heart failure.  Hospital Course:  Acute respiratory failure with hypoxia  Secondary to acute combined systolic and diastolic CHF 2-D echo with EF of 40-45% and rate 2 diastolic dysfunction without wall motion abnormality. EF unchanged from previous echo.     ?Chest pain -atypical. EKG shows lateral T wave inversion. cardiac cath with clean coronaries and EF of 45-50%.  ?OHS/OSA Patient reports having a sleep study several years back but denies ever being on CPAP at home. She will need an outpatient sleep study. Patient  desaturates to 80s on room air and ambulation. Will need home oxygen.  Paroxysmal A. fib In sinus rhythm. On high dose coreg, , Cardizem and Rythmol. Has mild prolonged PR interval. Marshall CHADS2 vasc score is at least 3 and she needs anticoagulation. Cardiology recommends Anticoagulation. Spoke with Dr Gwenlyn Found who agrees to start Marshall on Anticoagulation.  Will d/c on eliquis 5 mg bid.. Role of anticoagulation including benefits and side effects discussed with pt in detail. Follow up with cardiology as outpt   Essential hypertension Elevated BP post cath. Adjusted home BP meds. Added lisinopril  Chronic kidney disease Cr around 1.3-1.5 baseline since 2015.   Morbid obesity Counseled on weight loss and exercise.she was seen by Dr Redmond Pulling for bariatric surgery ,   Hyperkalemia Resolved after holding potassium supplement  Tobacco abuse Reports to have quit smoking past      Diet: Heart healthy   Code Status: Full code Family Communication: husband at bedside Disposition Plan: home   Consultants:  none  Procedures:  2 d echo  Cardiac cath on 4/11  Antibiotics:  none  Discharge Exam: Filed Vitals:   01/21/15 0520  BP: 143/76  Pulse: 61  Temp: 98.1 F (36.7 C)  Resp: 20     General: morbidly obese middle aged female no acute distress  HEENT: no pallor moist oral mucosa, no JVD  Cardiovascular: S1 and S2 normal , no murmurs rub or gallop  Respiratory: Diminished breath sounds bilaterally due to body habitus, no added sounds  Abdomen: Soft, nondistended, nontender, rt radail cath site is clean  Musculoskeletal: warm, no edema  CNS: alert and oreinted   Discharge Instructions    Current Discharge Medication List    START taking these medications   Details  apixaban (ELIQUIS) 5 MG TABS tablet Take 1 tablet (5 mg total) by mouth 2 (two) times daily. Qty: 60 tablet, Refills: 0    lisinopril (PRINIVIL,ZESTRIL) 20 MG tablet Take 1 tablet (20 mg  total) by mouth daily. Qty: 30 tablet, Refills: 0    traMADol-acetaminophen (ULTRACET) 37.5-325 MG per tablet Take 2 tablets by mouth every 8 (eight) hours as needed for moderate pain. Qty: 30 tablet, Refills: 0      CONTINUE these medications which have NOT CHANGED   Details  albuterol (PROVENTIL HFA;VENTOLIN HFA) 108 (90 BASE) MCG/ACT inhaler Inhale 2 puffs into the lungs every 6 (six) hours as needed for wheezing or shortness of breath.     allopurinol (ZYLOPRIM) 300 MG tablet Take 300 mg by mouth daily.    carvedilol (COREG) 25 MG tablet Take 2 tablets (50 mg total) by mouth 2 (two) times daily. Qty: 120 tablet, Refills: 9    colchicine 0.6 MG tablet Take 0.6 mg by mouth daily.    diltiazem (DILACOR XR) 180 MG 24 hr capsule Take 1 capsule (180 mg total) by mouth daily. Qty: 30 capsule, Refills: 5    furosemide (LASIX) 80 MG tablet Take 1 tablet (80 mg total) by mouth 2 (two) times daily. Qty: 60 tablet, Refills: 5    Multiple Vitamin (MULTIVITAMIN WITH MINERALS) TABS tablet Take 1 tablet by mouth daily.    Omega-3 Fatty Acids (FISH OIL CONCENTRATE PO) Take by mouth 1 day or 1 dose.    propafenone (RYTHMOL) 150 MG tablet Take 1 tablet (150 mg total) by mouth every 8 (eight) hours. Qty: 90 tablet, Refills: 5    Chlorpheniramine-DM (CORICIDIN COUGH/COLD) 4-30 MG TABS Take 1 tablet by mouth as needed (for cold).    metolazone (ZAROXOLYN) 2.5 MG tablet Take 1 tablet (2.5 mg total) by mouth 2 (two) times daily. Half hour before lasix dose Qty: 60 tablet, Refills: 5      STOP taking these medications     diclofenac (VOLTAREN) 75 MG EC tablet      predniSONE (DELTASONE) 10 MG tablet        Allergies  Allergen Reactions  . Penicillins Hives, Itching, Swelling and Rash   Follow-up Information    Follow up with michell edwards, NP.   Why:  APPT. ON APRIL 20 @ 8;30am   Contact information:   [address],[place]        [phone]          Follow up with [facility] On 03/27/2015.   Why:  8:00pm    Center will mail out packet of instructions to you   Contact information:   [address] [place] [phone]       The results of significant diagnostics from this hospitalization (including imaging, microbiology, ancillary and laboratory) are listed below for reference.    Significant Diagnostic Studies: Dg Chest 2 View  01/14/2015   CLINICAL DATA:  Increased shortness of breath since Saturday, blurred vision, history asthma, CHF, obesity, hypertension, former smoker  EXAM: CHEST  2 VIEW  COMPARISON:  10/02/2014  FINDINGS: Enlargement of cardiac silhouette with pulmonary vascular congestion.  Atherosclerotic calcification and minimal tortuosity of thoracic aorta.  Slightly increased perihilar markings particularly on RIGHT versus previous exam could reflect minimal pulmonary edema.  Remaining  lungs clear.  No pleural effusion or pneumothorax.  No acute osseous findings.  IMPRESSION: Enlargement of cardiac silhouette with pulmonary vascular congestion and question minimal perihilar edema particularly on RIGHT.   Electronically Signed   By: Lavonia Dana M.D.   On: 01/14/2015 12:47    Microbiology: No results found for this or any previous visit (from the past 240 hour(s)).   Labs: Basic Metabolic Panel:  Recent Labs Lab 01/17/15 0305 01/18/15 1143 01/19/15 0608 01/20/15 0340 01/21/15 0505  NA 142 140 140 140 141  K 4.2 5.4* 4.2 3.8 4.1  CL 104 100 98 100 101  CO2 33* 30 27 36* 35*  GLUCOSE 107* 114* 111* 98 110*  BUN 40* 39* 37* 29* 26*  CREATININE 1.55* 1.47* 1.50* 1.23* 1.27*  CALCIUM 9.0 9.3 9.1 9.0 9.1   Liver Function Tests: No results for input(s): AST, ALT, ALKPHOS, BILITOT, PROT, ALBUMIN in the last 168 hours. No results for input(s): LIPASE, AMYLASE in the last 168 hours. No results for input(s): AMMONIA in the last 168 hours. CBC:  Recent Labs Lab 01/14/15 1005  WBC 4.9   NEUTROABS 3.2  HGB 10.4*  HCT 33.9*  MCV 97.4  PLT 165   Cardiac Enzymes:  Recent Labs Lab 01/14/15 2315 01/15/15 0346  TROPONINI <0.03 <0.03   BNP: BNP (last 3 results)  Recent Labs  10/02/14 2211 01/14/15 1005  BNP 322.4* 557.6*    ProBNP (last 3 results) No results for input(s): PROBNP in the last 8760 hours.  CBG: No results for input(s): GLUCAP in the last 168 hours.     SignedLouellen Molder  [facility] 01/21/2015, 8:55 AM

## 2015-02-19 ENCOUNTER — Other Ambulatory Visit: Payer: Self-pay | Admitting: Internal Medicine

## 2015-02-24 ENCOUNTER — Other Ambulatory Visit: Payer: Self-pay | Admitting: Internal Medicine

## 2015-03-27 ENCOUNTER — Encounter (HOSPITAL_BASED_OUTPATIENT_CLINIC_OR_DEPARTMENT_OTHER): Payer: No Typology Code available for payment source

## 2015-04-04 ENCOUNTER — Telehealth: Payer: Self-pay | Admitting: Cardiovascular Disease

## 2015-04-09 ENCOUNTER — Ambulatory Visit: Payer: No Typology Code available for payment source | Admitting: Cardiovascular Disease

## 2015-04-11 NOTE — Telephone Encounter (Signed)
Close encounter 

## 2015-04-15 ENCOUNTER — Ambulatory Visit (INDEPENDENT_AMBULATORY_CARE_PROVIDER_SITE_OTHER): Payer: No Typology Code available for payment source | Admitting: Cardiovascular Disease

## 2015-04-15 ENCOUNTER — Encounter: Payer: Self-pay | Admitting: Cardiovascular Disease

## 2015-04-15 VITALS — BP 138/78 | HR 75 | Ht 67.5 in | Wt 359.4 lb

## 2015-04-15 DIAGNOSIS — I10 Essential (primary) hypertension: Secondary | ICD-10-CM

## 2015-04-15 DIAGNOSIS — E785 Hyperlipidemia, unspecified: Secondary | ICD-10-CM

## 2015-04-15 DIAGNOSIS — G4733 Obstructive sleep apnea (adult) (pediatric): Secondary | ICD-10-CM

## 2015-04-15 DIAGNOSIS — I480 Paroxysmal atrial fibrillation: Secondary | ICD-10-CM

## 2015-04-15 DIAGNOSIS — I5041 Acute combined systolic (congestive) and diastolic (congestive) heart failure: Secondary | ICD-10-CM

## 2015-04-15 NOTE — Progress Notes (Signed)
04/15/2015 Paige Marshall   1956/05/06  [id number]  Primary Physician EDWARDS, Milford Cage, NP Primary Cardiologist: Lorretta Harp MD Renae Gloss   HPI:   The patient is a 59 year old morbidly overweight, widowed [place] American female, mother of 2, grandmother to 3 grandchildren, who I last saw in the office 6 months ago. She work third shift as a Animal nutritionist at [employer] but a sense out of work because of inability to ambulate.. Her other problems include hypertension and discontinued tobacco abuse 1 year ago, as well as paroxysmal atrial fibrillation, maintaining sinus rhythm on Rythmol. She has obstructive sleep apnea, on CPAP. She has complained of progressive dyspnea and chest pain as well as lower extremity swelling. She was catheterized November 04, 2005, revealing normal coronary arteries and low-normal LV function, suggesting nonischemic cardiomyopathy secondary to uncontrolled hypertension. I began her on Zaroxolyn 2.5 mg daily every day, which resulted in diuresis and improvement in her symptoms of shortness of breath and swelling. A 2D echocardiogram revealed an EF of 45%, mildly decreased compared to prior echocardiogram, but consistent with her LV function by angiography in 2007. She is aware of salt restriction  She was admitted to [facility] April of this year because of systolic and diastolic heart failure. I performed cardiac catheterization on her 01/20/15 once again revealing normal coronary arteries with an EF of 45% elevated LVEDP. She is in need of bilateral knee replacements but has not pursued this because of her weight.  Current Outpatient Prescriptions  Medication Sig Dispense Refill  . albuterol (PROVENTIL HFA;VENTOLIN HFA) 108 (90 BASE) MCG/ACT inhaler Inhale 2 puffs into the lungs every 6 (six) hours as needed for wheezing or shortness of breath.     . allopurinol (ZYLOPRIM) 300 MG tablet Take 1 tablet (300 mg total) by mouth daily. 30 tablet  0  . carvedilol (COREG) 25 MG tablet Take 2 tablets (50 mg total) by mouth 2 (two) times daily. 120 tablet 9  . colchicine 0.6 MG tablet Take 0.6 mg by mouth daily.    [name] diltiazem (DILACOR XR) 180 MG 24 hr capsule Take 1 capsule (180 mg total) by mouth daily. 30 capsule 5  . furosemide (LASIX) 80 MG tablet Take 1 tablet (80 mg total) by mouth 2 (two) times daily. 60 tablet 5  . lisinopril (PRINIVIL,ZESTRIL) 20 MG tablet Take 1 tablet (20 mg total) by mouth daily. 30 tablet 0  . metolazone (ZAROXOLYN) 2.5 MG tablet Take 1 tablet (2.5 mg total) by mouth 2 (two) times daily. Half hour before lasix dose 60 tablet 5  . Multiple Vitamin (MULTIVITAMIN WITH MINERALS) TABS tablet Take 1 tablet by mouth daily.    . Omega-3 Fatty Acids (FISH OIL CONCENTRATE PO) Take by mouth 1 day or 1 dose.    . propafenone (RYTHMOL) 150 MG tablet Take 1 tablet (150 mg total) by mouth every 8 (eight) hours. 90 tablet 5  . diclofenac (VOLTAREN) 75 MG EC tablet Take 75 mg by mouth 2 (two) times daily with a meal.  5   No current facility-administered medications for this visit.    Allergies  Allergen Reactions  . Penicillins Hives, Itching, Swelling and Rash    History   Social History  . Marital Status: Widowed    Spouse Name: N/A  . Number of Children: N/A  . Years of Education: N/A   Occupational History  . Not on file.   Social History Main Topics  . Smoking status: Former  Smoker -- 0.33 packs/day for 35 years    Types: Cigarettes    Quit date: 10/11/2008  . Smokeless tobacco: Never Used  . Alcohol Use: 4.8 oz/week    8 Cans of beer per week     Comment: 01/14/2015 "~ 4, 24oz cans beer/wk"  . Drug Use: No  . Sexual Activity: No   Other Topics Concern  . Not on file   Social History Narrative     Review of Systems: General: negative for chills, fever, night sweats or weight changes.  Cardiovascular: negative for chest pain, dyspnea on exertion, edema, orthopnea, palpitations, paroxysmal  nocturnal dyspnea or shortness of breath Dermatological: negative for rash Respiratory: negative for cough or wheezing Urologic: negative for hematuria Abdominal: negative for nausea, vomiting, diarrhea, bright red blood per rectum, melena, or hematemesis Neurologic: negative for visual changes, syncope, or dizziness All other systems reviewed and are otherwise negative except as noted above.    Blood pressure 138/78, pulse 75, height 5' 7.5" (1.715 m), weight 359 lb 6.4 oz (163.023 kg), last menstrual period 09/07/2011.  General appearance: alert and no distress Neck: no adenopathy, no carotid bruit, no JVD, supple, symmetrical, trachea midline and thyroid not enlarged, symmetric, no tenderness/mass/nodules Lungs: clear to auscultation bilaterally Heart: regular rate and rhythm, S1, S2 normal, no murmur, click, rub or gallop Extremities: extremities normal, atraumatic, no cyanosis or edema  EKG normal sinus rhythm at 75 with nonspecific ST and T-wave changes. I personally reviewed this EKG  ASSESSMENT AND PLAN:   Uncontrolled hypertension History of hypertension with blood pressure measures 138/78. She is on carvedilol, diltiazem and lisinopril. Continue current meds at current dosing  PAF (paroxysmal atrial fibrillation) History of paroxysmal atrial fibrillation maintaining sinus rhythm on Rythmol  Obstructive sleep apnea History of obstructive sleep apnea on CPAP  Dyslipidemia History of hyperlipidemia currently not on a statin drug. Her most recent lipid profile performed 01/15/15 revealed total cholesterol 167, LDL of 93 and HDL of 35. This is acceptable for primary prevention  Acute combined systolic and diastolic congestive heart failure History of nonischemic cardiopathy with an EF in the 40-45% range. Recent cardiac catheterization which I performed radially 01/20/15 revealing normal coronary arteries with an EF of 45-50% and elevated LVEDP. She is on oral diabetics and is  aware of small restriction.      Lorretta Harp MD FACP,FACC,FAHA, [facility] 04/15/2015 11:26 AM

## 2015-04-15 NOTE — Patient Instructions (Signed)
Your physician recommends that you schedule a follow-up appointment in 6 months with an extender. Dr Gwenlyn Found recommends that you schedule a follow-up appointment in 1 year. You will receive a reminder letter in the mail two months in advance. If you don't receive a letter, please call our office to schedule the follow-up appointment.

## 2015-04-15 NOTE — Assessment & Plan Note (Signed)
History of nonischemic cardiopathy with an EF in the 40-45% range. Recent cardiac catheterization which I performed radially 01/20/15 revealing normal coronary arteries with an EF of 45-50% and elevated LVEDP. She is on oral diabetics and is aware of small restriction.

## 2015-04-15 NOTE — Assessment & Plan Note (Signed)
History of hyperlipidemia currently not on a statin drug. Her most recent lipid profile performed 01/15/15 revealed total cholesterol 167, LDL of 93 and HDL of 35. This is acceptable for primary prevention

## 2015-04-15 NOTE — Assessment & Plan Note (Signed)
History of hypertension with blood pressure measures 138/78. She is on carvedilol, diltiazem and lisinopril. Continue current meds at current dosing

## 2015-04-15 NOTE — Assessment & Plan Note (Signed)
History of obstructive sleep apnea on CPAP. 

## 2015-04-15 NOTE — Assessment & Plan Note (Signed)
History of paroxysmal atrial fibrillation maintaining sinus rhythm on Rythmol

## 2015-04-24 ENCOUNTER — Telehealth: Payer: Self-pay | Admitting: Cardiovascular Disease

## 2015-04-24 NOTE — Telephone Encounter (Signed)
Pt says she is not feeling right. Her stomach feels like it is swollen on the inside. She also says she can not walk any distance without getting tired.Please call to advise.

## 2015-04-24 NOTE — Telephone Encounter (Signed)
Patient scheduled for [facility] appt next week with Ignacia Bayley, NP, on July 20th. Called patient to inform her of appointment and she stated that she was feeling better now and she would rather not be seen in appointment due to the copay. "If I feel worse later then I'll just go to the Emergency Room". She stated her PCP told her "it might just be gas". Advised her that should she reconsider and need to be seen by cardiology to please call office back so we can schedule her an appointment. She verbalized understanding and agreement.

## 2015-04-24 NOTE — Telephone Encounter (Signed)
Have pt seen by Flex PA first of next week

## 2015-04-24 NOTE — Telephone Encounter (Signed)
Patient states that for past two days she is experiencing a feeling that her stomach is "swollen on the inside". She could not clarify what that means but stated "something is not right in my stomach". She is also having intermittent "aches" in her left arm/shoulder which she can not relate to any injury. And she states her SOB has worsened over the past couple of days to where she finds it difficult to walk even at home. She did not sound SOB on the phone but stated she was sitting down at the time. Denies other symptoms or complaints at this time. Advised she should contact her PCP for the stomach issues and that Dr. Gwenlyn Found would be notified of her SOB worsening. Patient was just seen in appointment with Dr. Gwenlyn Found on July 5th. Routed to Dr. Gwenlyn Found.

## 2015-04-30 ENCOUNTER — Ambulatory Visit: Payer: No Typology Code available for payment source | Admitting: Nurse Practitioner

## 2015-06-10 DIAGNOSIS — M170 Bilateral primary osteoarthritis of knee: Secondary | ICD-10-CM | POA: Diagnosis not present

## 2015-06-10 DIAGNOSIS — R262 Difficulty in walking, not elsewhere classified: Secondary | ICD-10-CM | POA: Diagnosis not present

## 2015-06-11 ENCOUNTER — Encounter: Payer: Self-pay | Admitting: Internal Medicine

## 2015-06-22 ENCOUNTER — Other Ambulatory Visit: Payer: Self-pay | Admitting: Cardiovascular Disease

## 2015-06-23 NOTE — Telephone Encounter (Signed)
X request sent to pharmacy.

## 2015-06-24 ENCOUNTER — Other Ambulatory Visit: Payer: Self-pay | Admitting: Cardiovascular Disease

## 2015-07-25 ENCOUNTER — Institutional Professional Consult (permissible substitution): Payer: No Typology Code available for payment source | Admitting: Internal Medicine

## 2015-08-20 ENCOUNTER — Other Ambulatory Visit: Payer: Self-pay | Admitting: Cardiovascular Disease

## 2015-10-10 ENCOUNTER — Encounter (HOSPITAL_COMMUNITY): Payer: Self-pay | Admitting: Emergency Medicine

## 2015-10-10 ENCOUNTER — Emergency Department (HOSPITAL_COMMUNITY): Payer: Medicare Other

## 2015-10-10 ENCOUNTER — Inpatient Hospital Stay (HOSPITAL_COMMUNITY)
Admission: EM | Admit: 2015-10-10 | Discharge: 2015-10-12 | DRG: 193 | Disposition: A | Discharge status: Home / Self Care | Payer: Medicare Other | Attending: Internal Medicine | Admitting: Internal Medicine

## 2015-10-10 DIAGNOSIS — I130 Hypertensive heart and chronic kidney disease with heart failure and stage 1 through stage 4 chronic kidney disease, or unspecified chronic kidney disease: Secondary | ICD-10-CM | POA: Diagnosis not present

## 2015-10-10 DIAGNOSIS — I10 Essential (primary) hypertension: Secondary | ICD-10-CM | POA: Diagnosis present

## 2015-10-10 DIAGNOSIS — I480 Paroxysmal atrial fibrillation: Secondary | ICD-10-CM | POA: Diagnosis present

## 2015-10-10 DIAGNOSIS — E876 Hypokalemia: Secondary | ICD-10-CM | POA: Diagnosis present

## 2015-10-10 DIAGNOSIS — N183 Chronic kidney disease, stage 3 unspecified: Secondary | ICD-10-CM

## 2015-10-10 DIAGNOSIS — I2510 Atherosclerotic heart disease of native coronary artery without angina pectoris: Secondary | ICD-10-CM | POA: Diagnosis present

## 2015-10-10 DIAGNOSIS — R0602 Shortness of breath: Secondary | ICD-10-CM | POA: Diagnosis not present

## 2015-10-10 DIAGNOSIS — Z79899 Other long term (current) drug therapy: Secondary | ICD-10-CM

## 2015-10-10 DIAGNOSIS — J45901 Unspecified asthma with (acute) exacerbation: Secondary | ICD-10-CM | POA: Diagnosis present

## 2015-10-10 DIAGNOSIS — J441 Chronic obstructive pulmonary disease with (acute) exacerbation: Secondary | ICD-10-CM | POA: Diagnosis not present

## 2015-10-10 DIAGNOSIS — J9601 Acute respiratory failure with hypoxia: Secondary | ICD-10-CM

## 2015-10-10 DIAGNOSIS — J209 Acute bronchitis, unspecified: Secondary | ICD-10-CM | POA: Diagnosis present

## 2015-10-10 DIAGNOSIS — I5022 Chronic systolic (congestive) heart failure: Secondary | ICD-10-CM | POA: Diagnosis present

## 2015-10-10 DIAGNOSIS — J9621 Acute and chronic respiratory failure with hypoxia: Secondary | ICD-10-CM | POA: Diagnosis present

## 2015-10-10 DIAGNOSIS — Z87891 Personal history of nicotine dependence: Secondary | ICD-10-CM

## 2015-10-10 DIAGNOSIS — G4733 Obstructive sleep apnea (adult) (pediatric): Secondary | ICD-10-CM | POA: Diagnosis present

## 2015-10-10 DIAGNOSIS — M170 Bilateral primary osteoarthritis of knee: Secondary | ICD-10-CM | POA: Diagnosis present

## 2015-10-10 DIAGNOSIS — I440 Atrioventricular block, first degree: Secondary | ICD-10-CM | POA: Diagnosis present

## 2015-10-10 DIAGNOSIS — R05 Cough: Secondary | ICD-10-CM | POA: Diagnosis not present

## 2015-10-10 DIAGNOSIS — Z8249 Family history of ischemic heart disease and other diseases of the circulatory system: Secondary | ICD-10-CM

## 2015-10-10 DIAGNOSIS — Z23 Encounter for immunization: Secondary | ICD-10-CM | POA: Diagnosis present

## 2015-10-10 DIAGNOSIS — J189 Pneumonia, unspecified organism: Secondary | ICD-10-CM | POA: Diagnosis not present

## 2015-10-10 DIAGNOSIS — I1310 Hypertensive heart and chronic kidney disease without heart failure, with stage 1 through stage 4 chronic kidney disease, or unspecified chronic kidney disease: Secondary | ICD-10-CM | POA: Diagnosis present

## 2015-10-10 DIAGNOSIS — J449 Chronic obstructive pulmonary disease, unspecified: Secondary | ICD-10-CM | POA: Insufficient documentation

## 2015-10-10 DIAGNOSIS — I509 Heart failure, unspecified: Secondary | ICD-10-CM | POA: Diagnosis not present

## 2015-10-10 DIAGNOSIS — R609 Edema, unspecified: Secondary | ICD-10-CM | POA: Diagnosis not present

## 2015-10-10 LAB — CBC WITH DIFFERENTIAL/PLATELET
Basophils Absolute: 0 10*3/uL (ref 0.0–0.1)
Basophils Relative: 0 %
EOS PCT: 2 %
Eosinophils Absolute: 0.1 10*3/uL (ref 0.0–0.7)
HCT: 27.2 % — ABNORMAL LOW (ref 36.0–46.0)
Hemoglobin: 8.2 g/dL — ABNORMAL LOW (ref 12.0–15.0)
LYMPHS PCT: 12 %
Lymphs Abs: 0.7 10*3/uL (ref 0.7–4.0)
MCH: 28.6 pg (ref 26.0–34.0)
MCHC: 30.1 g/dL (ref 30.0–36.0)
MCV: 94.8 fL (ref 78.0–100.0)
MONOS PCT: 4 %
Monocytes Absolute: 0.3 10*3/uL (ref 0.1–1.0)
Neutro Abs: 5.1 10*3/uL (ref 1.7–7.7)
Neutrophils Relative %: 82 %
PLATELETS: 211 10*3/uL (ref 150–400)
RBC: 2.87 MIL/uL — AB (ref 3.87–5.11)
RDW: 19.6 % — ABNORMAL HIGH (ref 11.5–15.5)
WBC: 6.2 10*3/uL (ref 4.0–10.5)

## 2015-10-10 LAB — I-STAT TROPONIN, ED: TROPONIN I, POC: 0.01 ng/mL (ref 0.00–0.08)

## 2015-10-10 LAB — COMPREHENSIVE METABOLIC PANEL
ALK PHOS: 62 U/L (ref 38–126)
ALT: 18 U/L (ref 14–54)
AST: 22 U/L (ref 15–41)
Albumin: 4.1 g/dL (ref 3.5–5.0)
Anion gap: 12 (ref 5–15)
BUN: 41 mg/dL — ABNORMAL HIGH (ref 6–20)
CALCIUM: 9.3 mg/dL (ref 8.9–10.3)
CHLORIDE: 105 mmol/L (ref 101–111)
CO2: 28 mmol/L (ref 22–32)
CREATININE: 1.88 mg/dL — AB (ref 0.44–1.00)
GFR calc non Af Amer: 29 mL/min — ABNORMAL LOW (ref >60)
GFR, EST AFRICAN AMERICAN: 33 mL/min — AB (ref >60)
Glucose, Bld: 128 mg/dL — ABNORMAL HIGH (ref 65–99)
Potassium: 3.2 mmol/L — ABNORMAL LOW (ref 3.5–5.1)
Sodium: 145 mmol/L (ref 135–145)
Total Bilirubin: 1.5 mg/dL — ABNORMAL HIGH (ref 0.3–1.2)
Total Protein: 7.8 g/dL (ref 6.5–8.1)

## 2015-10-10 LAB — BRAIN NATRIURETIC PEPTIDE: B Natriuretic Peptide: 621.1 pg/mL — ABNORMAL HIGH (ref 0.0–100.0)

## 2015-10-10 LAB — LIPASE, BLOOD: Lipase: 27 U/L (ref 11–51)

## 2015-10-10 MED ORDER — DILTIAZEM HCL ER 180 MG PO CP24
180.0000 mg | ORAL_CAPSULE | Freq: Every day | ORAL | Status: DC
  Administered 2015-10-11 – 2015-10-12 (×2): 180 mg via ORAL
  Filled 2015-10-10 (×3): qty 1

## 2015-10-10 MED ORDER — ALBUTEROL SULFATE (2.5 MG/3ML) 0.083% IN NEBU: 5.0000 mg | INHALATION_SOLUTION | Freq: Once | RESPIRATORY_TRACT | Status: DC

## 2015-10-10 MED ORDER — METHYLPREDNISOLONE SODIUM SUCC 125 MG IJ SOLR
125.0000 mg | Freq: Once | INTRAMUSCULAR | Status: AC
  Administered 2015-10-10: 125 mg via INTRAVENOUS
  Filled 2015-10-10: qty 2

## 2015-10-10 MED ORDER — ALUM & MAG HYDROXIDE-SIMETH 200-200-20 MG/5ML PO SUSP: 30.0000 mL | Freq: Four times a day (QID) | ORAL | Status: DC | PRN

## 2015-10-10 MED ORDER — LEVOFLOXACIN 750 MG PO TABS
750.0000 mg | ORAL_TABLET | Freq: Every day | ORAL | Status: DC
  Filled 2015-10-10: qty 1

## 2015-10-10 MED ORDER — ONDANSETRON HCL 4 MG PO TABS: 4.0000 mg | ORAL_TABLET | Freq: Four times a day (QID) | ORAL | Status: DC | PRN

## 2015-10-10 MED ORDER — LEVOFLOXACIN 750 MG PO TABS
750.0000 mg | ORAL_TABLET | Freq: Every day | ORAL | Status: DC
  Administered 2015-10-11 (×2): 750 mg via ORAL
  Filled 2015-10-10 (×2): qty 1

## 2015-10-10 MED ORDER — ACETAMINOPHEN 650 MG RE SUPP: 650.0000 mg | Freq: Four times a day (QID) | RECTAL | Status: DC | PRN

## 2015-10-10 MED ORDER — SODIUM CHLORIDE 0.9 % IJ SOLN: 3.0000 mL | INTRAMUSCULAR | Status: DC | PRN

## 2015-10-10 MED ORDER — ENOXAPARIN SODIUM 80 MG/0.8ML ~~LOC~~ SOLN
80.0000 mg | Freq: Every day | SUBCUTANEOUS | Status: DC
Start: 2015-10-11 — End: 2015-10-12
  Administered 2015-10-11 (×2): 80 mg via SUBCUTANEOUS
  Filled 2015-10-10 (×2): qty 0.8

## 2015-10-10 MED ORDER — ALLOPURINOL 300 MG PO TABS
300.0000 mg | ORAL_TABLET | Freq: Every day | ORAL | Status: DC
  Administered 2015-10-11 – 2015-10-12 (×2): 300 mg via ORAL
  Filled 2015-10-10 (×2): qty 1

## 2015-10-10 MED ORDER — PROPAFENONE HCL 150 MG PO TABS
150.0000 mg | ORAL_TABLET | Freq: Three times a day (TID) | ORAL | Status: DC
  Administered 2015-10-11 – 2015-10-12 (×4): 150 mg via ORAL
  Filled 2015-10-10 (×6): qty 1

## 2015-10-10 MED ORDER — FUROSEMIDE 10 MG/ML IJ SOLN
40.0000 mg | Freq: Once | INTRAMUSCULAR | Status: AC
  Administered 2015-10-10: 40 mg via INTRAVENOUS
  Filled 2015-10-10: qty 4

## 2015-10-10 MED ORDER — ONDANSETRON HCL 4 MG/2ML IJ SOLN: 4.0000 mg | Freq: Four times a day (QID) | INTRAMUSCULAR | Status: DC | PRN

## 2015-10-10 MED ORDER — CARVEDILOL 25 MG PO TABS
25.0000 mg | ORAL_TABLET | Freq: Two times a day (BID) | ORAL | Status: DC
  Administered 2015-10-11 – 2015-10-12 (×3): 25 mg via ORAL
  Filled 2015-10-10 (×3): qty 1

## 2015-10-10 MED ORDER — HYDROMORPHONE HCL 1 MG/ML IJ SOLN
0.5000 mg | INTRAMUSCULAR | Status: DC | PRN
  Administered 2015-10-11 (×5): 0.5 mg via INTRAVENOUS
  Administered 2015-10-12: 1 mg via INTRAVENOUS
  Filled 2015-10-10 (×6): qty 1

## 2015-10-10 MED ORDER — ACETAMINOPHEN 325 MG PO TABS: 650.0000 mg | ORAL_TABLET | Freq: Four times a day (QID) | ORAL | Status: DC | PRN

## 2015-10-10 MED ORDER — OMEGA-3-ACID ETHYL ESTERS 1 G PO CAPS
1.0000 g | ORAL_CAPSULE | Freq: Every day | ORAL | Status: DC
  Administered 2015-10-11 – 2015-10-12 (×2): 1 g via ORAL
  Filled 2015-10-10 (×2): qty 1

## 2015-10-10 MED ORDER — OXYCODONE HCL 5 MG PO TABS
5.0000 mg | ORAL_TABLET | ORAL | Status: DC | PRN
  Administered 2015-10-12: 5 mg via ORAL
  Filled 2015-10-10: qty 1

## 2015-10-10 MED ORDER — SODIUM CHLORIDE 0.9 % IV SOLN: 250.0000 mL | INTRAVENOUS | Status: DC | PRN

## 2015-10-10 MED ORDER — FUROSEMIDE 40 MG PO TABS
80.0000 mg | ORAL_TABLET | Freq: Two times a day (BID) | ORAL | Status: DC
  Administered 2015-10-11 – 2015-10-12 (×3): 80 mg via ORAL
  Filled 2015-10-10 (×4): qty 2

## 2015-10-10 MED ORDER — SODIUM CHLORIDE 0.9 % IJ SOLN
3.0000 mL | Freq: Two times a day (BID) | INTRAMUSCULAR | Status: DC
  Administered 2015-10-11 (×3): 3 mL via INTRAVENOUS

## 2015-10-10 MED ORDER — IPRATROPIUM-ALBUTEROL 0.5-2.5 (3) MG/3ML IN SOLN
3.0000 mL | RESPIRATORY_TRACT | Status: DC
  Administered 2015-10-11: 3 mL via RESPIRATORY_TRACT
  Filled 2015-10-10: qty 3

## 2015-10-10 MED ORDER — METOLAZONE 2.5 MG PO TABS
2.5000 mg | ORAL_TABLET | Freq: Two times a day (BID) | ORAL | Status: DC
  Administered 2015-10-11 – 2015-10-12 (×3): 2.5 mg via ORAL
  Filled 2015-10-10 (×4): qty 1

## 2015-10-10 MED ORDER — IPRATROPIUM-ALBUTEROL 0.5-2.5 (3) MG/3ML IN SOLN
3.0000 mL | Freq: Once | RESPIRATORY_TRACT | Status: AC
  Administered 2015-10-10: 3 mL via RESPIRATORY_TRACT
  Filled 2015-10-10: qty 3

## 2015-10-10 MED ORDER — METHYLPREDNISOLONE SODIUM SUCC 125 MG IJ SOLR
125.0000 mg | Freq: Once | INTRAMUSCULAR | Status: AC
Start: 2015-10-11 — End: 2015-10-11
  Administered 2015-10-11: 125 mg via INTRAVENOUS
  Filled 2015-10-10: qty 2

## 2015-10-10 NOTE — ED Provider Notes (Signed)
CSN: [id number]     Arrival date & time 10/10/15  1512 History   First MD Initiated Contact with Patient 10/10/15 1857     Chief Complaint  Patient presents with  . Shortness of Breath     (Consider location/radiation/quality/duration/timing/severity/associated sxs/prior Treatment) The history is provided by the patient.  Ave L Mcquitty is a 59 y.o. female hx of CHF, HTN, afib, former smoker here with shortness of breath. Shortness of breath and productive cough with greenish sputum and some body aches for the last 5 days. Has some subjective chills but no fevers. Denies any leg swelling. She did ride a train for 2 hours post on Monday and today. No history of DVT or PE. Sent from urgent care for evaluation. She was noted to be hypoxic 88% in triage. She is not on oxygen at home.    Past Medical History  Diagnosis Date  . CHF (congestive heart failure) ([place])   . Hypertension   . Paroxysmal atrial fibrillation ([facility])   . High cholesterol   . Former smoker   . Dyspnea     progressive  . Edema     Lower extremity  . Coronary artery disease   . Asthma   . Pneumonia X 2  . OSA (obstructive sleep apnea)     "never sent CPAP out" (01/14/2015)  . Arthritis     "both knees" (01/14/2015)  . Degenerative joint disease of both lower legs   . Gout     hx in "both knees" (01/14/2015)  . Nonischemic cardiomyopathy [facility])    Past Surgical History  Procedure Laterality Date  . Breath tek h pylori N/A 12/31/2013    Procedure: BREATH TEK H PYLORI;  Surgeon: Gayland Curry, MD;  Location: [facility];  Service: General;  Laterality: N/A;  . Doppler echocardiography      2 D   EF of 45%  . Angiogram/lv (congenital)  2007  . Sleep study  2011  . Stress myocardial dipyridamole perfusion    . Venous duplex ultrasound  2013  . Coronary angioplasty with stent placement  11/04/2005    "1"  . Tubal ligation  1982  . Eye surgery    . Corneal transplant Right ~ 2010  . Left heart catheterization  with coronary angiogram N/A 01/20/2015    Procedure: LEFT HEART CATHETERIZATION WITH CORONARY ANGIOGRAM;  Surgeon: Lorretta Harp, MD;  Location: [facility] CATH LAB;  Service: Cardiovascular;  Laterality: N/A;   Family History  Problem Relation Age of Onset  . Heart disease Mother   . Cancer Father     colon  . Hypertension Other    Social History  Substance Use Topics  . Smoking status: Former Smoker -- 0.33 packs/day for 35 years    Types: Cigarettes    Quit date: 10/11/2008  . Smokeless tobacco: Never Used  . Alcohol Use: 4.8 oz/week    8 Cans of beer per week     Comment: 01/14/2015 "~ 4, 24oz cans beer/wk"   OB History    No data available     Review of Systems  Respiratory: Positive for shortness of breath.   All other systems reviewed and are negative.     Allergies  Penicillins  Home Medications   Prior to Admission medications   Medication Sig Start Date End Date Taking? Authorizing Provider  albuterol (PROVENTIL HFA;VENTOLIN HFA) 108 (90 BASE) MCG/ACT inhaler Inhale 2 puffs into the lungs every 6 (six) hours as needed for wheezing or  shortness of breath.    Yes Historical Provider, MD  allopurinol (ZYLOPRIM) 300 MG tablet Take 1 tablet (300 mg total) by mouth daily. 01/21/15  Yes Nishant Dhungel, MD  carvedilol (COREG) 25 MG tablet TAKE 2 TABLETS (50 MG TOTAL) BY MOUTH 2 (TWO) TIMES DAILY. 08/20/15  Yes Lorretta Harp, MD  diclofenac (VOLTAREN) 75 MG EC tablet Take 75 mg by mouth 2 (two) times daily with a meal. 03/22/15  Yes Historical Provider, MD  diltiazem (DILACOR XR) 180 MG 24 hr capsule TAKE 1 CAPSULE (180 MG TOTAL) BY MOUTH DAILY. 06/24/15  Yes Lorretta Harp, MD  furosemide (LASIX) 80 MG tablet TAKE 1 TABLET (80 MG TOTAL) BY MOUTH 2 (TWO) TIMES DAILY. 06/24/15  Yes Lorretta Harp, MD  metolazone (ZAROXOLYN) 2.5 MG tablet TAKE 1 TABLET (2.5 MG TOTAL) BY MOUTH 2 (TWO) TIMES DAILY. HALF HOUR BEFORE LASIX DOSE 06/24/15  Yes Lorretta Harp, MD  Multiple Vitamin  (MULTIVITAMIN WITH MINERALS) TABS tablet Take 1 tablet by mouth daily.   Yes Historical Provider, MD  Omega-3 Fatty Acids (FISH OIL CONCENTRATE PO) Take 1 capsule by mouth daily.    Yes Historical Provider, MD  propafenone (RYTHMOL) 150 MG tablet TAKE 1 TABLET (150 MG TOTAL) BY MOUTH EVERY 8 (EIGHT) HOURS. 06/23/15  Yes Pixie Casino, MD  lisinopril (PRINIVIL,ZESTRIL) 20 MG tablet Take 1 tablet (20 mg total) by mouth daily. Patient not taking: Reported on 10/10/2015 01/21/15   Nishant Dhungel, MD   BP 120/76 mmHg  Pulse 66  Resp 18  SpO2 92%  LMP 09/07/2011 Physical Exam  Constitutional: She is oriented to person, place, and time.  Tachypneic, overweight   HENT:  Head: Normocephalic.  Mouth/Throat: Oropharynx is clear and moist.  Eyes: Conjunctivae are normal. Pupils are equal, round, and reactive to light.  Neck: Normal range of motion. Neck supple.  Cardiovascular: Normal rate, regular rhythm and normal heart sounds.   Pulmonary/Chest:  Tachypneic, + mild diffuse wheezing. Diminished bilateral bases   Abdominal: Soft. Bowel sounds are normal.  Mild epigastric tenderness, no rebound.   Musculoskeletal:  1+ edema bilaterally, no calf tenderness   Neurological: She is alert and oriented to person, place, and time. No cranial nerve deficit. Coordination normal.  Skin: Skin is warm and dry.  Psychiatric: She has a normal mood and affect. Her behavior is normal. Judgment and thought content normal.  Nursing note and vitals reviewed.   ED Course  Procedures (including critical care time) Labs Review Labs Reviewed  CBC WITH DIFFERENTIAL/PLATELET - Abnormal; Notable for the following:    RBC 2.87 (*)    Hemoglobin 8.2 (*)    HCT 27.2 (*)    RDW 19.6 (*)    All other components within normal limits  COMPREHENSIVE METABOLIC PANEL - Abnormal; Notable for the following:    Potassium 3.2 (*)    Glucose, Bld 128 (*)    BUN 41 (*)    Creatinine, Ser 1.88 (*)    Total Bilirubin 1.5  (*)    GFR calc non Af Amer 29 (*)    GFR calc Af Amer 33 (*)    All other components within normal limits  BRAIN NATRIURETIC PEPTIDE - Abnormal; Notable for the following:    B Natriuretic Peptide 621.1 (*)    All other components within normal limits  LIPASE, BLOOD  I-STAT TROPOININ, ED    Imaging Review Dg Chest 2 View  10/10/2015  CLINICAL DATA:  Shortness of breath, chest pain,  cough. Nausea and vomiting for 2-3 days. History of CHF. EXAM: CHEST  2 VIEW COMPARISON:  Chest x-ray dated 01/14/2015. FINDINGS: The marked cardiomegaly is unchanged. Central pulmonary vascular congestion is unchanged, likely chronic. No new lung findings. No overt pulmonary edema. No pleural effusions seen. No pneumothorax. Osseous and soft tissue structures about the chest are unremarkable. IMPRESSION: Marked cardiomegaly, unchanged. Mild central pulmonary vascular congestion appears stable, presumably a mild chronic CHF. No overt alveolar pulmonary edema. No acute lung findings seen. Electronically Signed   By: Franki Cabot M.D.   On: 10/10/2015 15:46   I have personally reviewed and evaluated these images and lab results as part of my medical decision-making.   EKG Interpretation None      MDM   Final diagnoses:  None   Lillyauna L Danehy is a 59 y.o. female here with shortness of breath, body aches. Likely viral syndrome vs COPD vs CHF exacerbation. Will get labs, BNP, CXR. Will give nebs, steroids and reassess.   9:30 PM Cr. 1.88. CXR showed CHF. BNP 600. Still hypoxic. Will admit to tele.   Wandra Arthurs, MD 10/10/15 [id number]

## 2015-10-10 NOTE — H&P (Signed)
[facility] Admission History and Physical       Paige Marshall [id number] DOB: 1956/06/26 DOA: 10/10/2015  Referring physician: EDP PCP: Kerin Perna, NP  Specialists:   Chief Complaint: SOB Wheezing and Cough  HPI: Paige Marshall is a 59 y.o. female with a history of Asthma, systolic CHF, Paroxsymal Atrial Fibrillation, Stage III CKD who presents to the ED after being seen at an area [facility] for complaints of SOB, Cough productive of green mucus, and Wheezing x 5 days.  She reports having myalgias, and nausea and vomiting x 3 days.    She was evaluated in the ED and found to have hypoxia to 87%.  She was placed on 2 liters of NCO2 and had improvement.  Her Chest X-ray revealed mild Central Venous Congestion but no overt Pulmonary edema and no Pneumonia.   She was placed on empiric IV Levaquin, and administered IV Solumedrol, and Duonebs and had mild improvement and was referred for admission.       Review of Systems:    Constitutional: No Weight Loss, No Weight Gain, Night Sweats, Fevers, Chills, +Myalgias, Dizziness, Light Headedness, Fatigue, or Generalized Weakness HEENT: No Headaches, Difficulty Swallowing,Tooth/Dental Problems,Sore Throat,  No Sneezing, Rhinitis, Ear Ache, Nasal Congestion, or Post Nasal Drip,  Cardio-vascular:  No Chest pain, Orthopnea, PND, Edema in Lower Extremities, Anasarca, Dizziness, Palpitations  Resp: +Dyspnea, No DOE, +Productive Cough, No Non-Productive Cough, No Hemoptysis, +Wheezing.    GI: No Heartburn, Indigestion, Abdominal Pain, Nausea, Vomiting, Diarrhea, Constipation, Hematemesis, Hematochezia, Melena, Change in Bowel Habits,  Loss of Appetite  GU: No Dysuria, No Change in Color of Urine, No Urgency or Urinary Frequency, No Flank pain.  Musculoskeletal: No Joint Pain or Swelling, No Decreased Range of Motion, No Back Pain.  Neurologic: No Syncope, No Seizures, Muscle Weakness, Paresthesia, Vision Disturbance or Loss, No  Diplopia, No Vertigo, No Difficulty Walking,  Skin: No Rash or Lesions. Psych: No Change in Mood or Affect, No Depression or Anxiety, No Memory loss, No Confusion, or Hallucinations   Past Medical History  Diagnosis Date  . CHF (congestive heart failure) ([place])   . Hypertension   . Paroxysmal atrial fibrillation (HCC)   . High cholesterol   . Former smoker   . Dyspnea     progressive  . Edema     Lower extremity  . Coronary artery disease   . Asthma   . Pneumonia X 2  . OSA (obstructive sleep apnea)     "never sent CPAP out" (01/14/2015)  . Arthritis     "both knees" (01/14/2015)  . Degenerative joint disease of both lower legs   . Gout     hx in "both knees" (01/14/2015)  . Nonischemic cardiomyopathy [facility])      Past Surgical History  Procedure Laterality Date  . Breath tek h pylori N/A 12/31/2013    Procedure: BREATH TEK H PYLORI;  Surgeon: Gayland Curry, MD;  Location: [facility];  Service: General;  Laterality: N/A;  . Doppler echocardiography      2 D   EF of 45%  . Angiogram/lv (congenital)  2007  . Sleep study  2011  . Stress myocardial dipyridamole perfusion    . Venous duplex ultrasound  2013  . Coronary angioplasty with stent placement  11/04/2005    "1"  . Tubal ligation  1982  . Eye surgery    . Corneal transplant Right ~ 2010  . Left heart catheterization with coronary angiogram N/A 01/20/2015  Procedure: LEFT HEART CATHETERIZATION WITH CORONARY ANGIOGRAM;  Surgeon: Lorretta Harp, MD;  Location: [facility] CATH LAB;  Service: Cardiovascular;  Laterality: N/A;      Prior to Admission medications   Medication Sig Start Date End Date Taking? Authorizing Provider  albuterol (PROVENTIL HFA;VENTOLIN HFA) 108 (90 BASE) MCG/ACT inhaler Inhale 2 puffs into the lungs every 6 (six) hours as needed for wheezing or shortness of breath.    Yes Historical Provider, MD  allopurinol (ZYLOPRIM) 300 MG tablet Take 1 tablet (300 mg total) by mouth daily. 01/21/15  Yes Nishant  Dhungel, MD  carvedilol (COREG) 25 MG tablet TAKE 2 TABLETS (50 MG TOTAL) BY MOUTH 2 (TWO) TIMES DAILY. 08/20/15  Yes Lorretta Harp, MD  diclofenac (VOLTAREN) 75 MG EC tablet Take 75 mg by mouth 2 (two) times daily with a meal. 03/22/15  Yes Historical Provider, MD  diltiazem (DILACOR XR) 180 MG 24 hr capsule TAKE 1 CAPSULE (180 MG TOTAL) BY MOUTH DAILY. 06/24/15  Yes Lorretta Harp, MD  furosemide (LASIX) 80 MG tablet TAKE 1 TABLET (80 MG TOTAL) BY MOUTH 2 (TWO) TIMES DAILY. 06/24/15  Yes Lorretta Harp, MD  metolazone (ZAROXOLYN) 2.5 MG tablet TAKE 1 TABLET (2.5 MG TOTAL) BY MOUTH 2 (TWO) TIMES DAILY. HALF HOUR BEFORE LASIX DOSE 06/24/15  Yes Lorretta Harp, MD  Multiple Vitamin (MULTIVITAMIN WITH MINERALS) TABS tablet Take 1 tablet by mouth daily.   Yes Historical Provider, MD  Omega-3 Fatty Acids (FISH OIL CONCENTRATE PO) Take 1 capsule by mouth daily.    Yes Historical Provider, MD  propafenone (RYTHMOL) 150 MG tablet TAKE 1 TABLET (150 MG TOTAL) BY MOUTH EVERY 8 (EIGHT) HOURS. 06/23/15  Yes Pixie Casino, MD  lisinopril (PRINIVIL,ZESTRIL) 20 MG tablet Take 1 tablet (20 mg total) by mouth daily. Patient not taking: Reported on 10/10/2015 01/21/15   Louellen Molder, MD     Allergies  Allergen Reactions  . Penicillins Hives, Itching, Swelling and Rash    Social History:  reports that she quit smoking about 7 years ago. Her smoking use included Cigarettes. She has a 11.55 pack-year smoking history. She has never used smokeless tobacco. She reports that she drinks about 4.8 oz of alcohol per week. She reports that she does not use illicit drugs.    Family History  Problem Relation Age of Onset  . Heart disease Mother   . Cancer Father     colon  . Hypertension Other        Physical Exam:  GEN:  Pleasant Obese 59 y.o. African American female examined and in no acute distress; cooperative with exam Filed Vitals:   10/10/15 1922 10/10/15 2115 10/10/15 2128 10/10/15 2230  BP:    120/76 137/107  Pulse:   66 73  Temp:    97.7 F (36.5 C)  TempSrc:    Oral  Resp:   18 19  SpO2: 96% 87% 92% 97%   Blood pressure 137/107, pulse 73, temperature 97.7 F (36.5 C), temperature source Oral, resp. rate 19, last menstrual period 09/07/2011, SpO2 97 %. PSYCH: She is alert and oriented x4; does not appear anxious does not appear depressed; affect is normal HEENT: Normocephalic and Atraumatic, Mucous membranes pink; PERRLA; EOM intact; Fundi:  Benign;  No scleral icterus, Nares: Patent, Oropharynx: Clear,  Fair Dentition,    Neck:  FROM, No Cervical Lymphadenopathy nor Thyromegaly or Carotid Bruit; No JVD; Breasts:: Not examined CHEST WALL: No tenderness CHEST: Normal respiration, clear to auscultation  bilaterally HEART: Regular rate and rhythm; no murmurs rubs or gallops ABDOMEN: Positive Bowel Sounds, Obese, Soft Non-Tender, No Rebound or Guarding; No Masses, No Organomegaly,  BACK: No kyphosis or scoliosis; No CVA tenderness Rectal Exam: Not done EXTREMITIES: No  Cyanosis, Clubbing, or Edema; No Ulcerations. Genitalia: not examined PULSES: 2+ and symmetric SKIN: Normal hydration no rash or ulceration CNS:  Alert and Oriented x 4, No Focal Deficits Vascular: pulses palpable throughout    Labs on Admission:  Basic Metabolic Panel:  Recent Labs Lab 10/10/15 1944  NA 145  K 3.2*  CL 105  CO2 28  GLUCOSE 128*  BUN 41*  CREATININE 1.88*  CALCIUM 9.3   Liver Function Tests:  Recent Labs Lab 10/10/15 1944  AST 22  ALT 18  ALKPHOS 62  BILITOT 1.5*  PROT 7.8  ALBUMIN 4.1    Recent Labs Lab 10/10/15 1944  LIPASE 27   No results for input(s): AMMONIA in the last 168 hours. CBC:  Recent Labs Lab 10/10/15 1944  WBC 6.2  NEUTROABS 5.1  HGB 8.2*  HCT 27.2*  MCV 94.8  PLT 211   Cardiac Enzymes: No results for input(s): CKTOTAL, CKMB, CKMBINDEX, TROPONINI in the last 168 hours.  BNP (last 3 results)  Recent Labs  01/14/15 1005 10/10/15 1946   BNP 557.6* 621.1*    ProBNP (last 3 results) No results for input(s): PROBNP in the last 8760 hours.  CBG: No results for input(s): GLUCAP in the last 168 hours.  Radiological Exams on Admission: Dg Chest 2 View  10/10/2015  CLINICAL DATA:  Shortness of breath, chest pain, cough. Nausea and vomiting for 2-3 days. History of CHF. EXAM: CHEST  2 VIEW COMPARISON:  Chest x-ray dated 01/14/2015. FINDINGS: The marked cardiomegaly is unchanged. Central pulmonary vascular congestion is unchanged, likely chronic. No new lung findings. No overt pulmonary edema. No pleural effusions seen. No pneumothorax. Osseous and soft tissue structures about the chest are unremarkable. IMPRESSION: Marked cardiomegaly, unchanged. Mild central pulmonary vascular congestion appears stable, presumably a mild chronic CHF. No overt alveolar pulmonary edema. No acute lung findings seen. Electronically Signed   By: Franki Cabot M.D.   On: 10/10/2015 15:46       Assessment/Plan:     59 y.o. female with  Principal Problem:   1.     Asthma exacerbation   IV Steroids   DuoNebs   NCO2 PRN   Monitor O2 sats   Active Problems:   2.     Acute respiratory failure with hypoxia (HCC)   O2 PRN   Monitor O2 sats     3.     Acute bronchitis vs Early Pneumonia   IV Levaquin        4.     Essential hypertension   Continue Lasix, Metolazone, Diltiazem, and Carvedilol RX   Monitor BPs     5.     PAF (paroxysmal atrial fibrillation) (HCC)   Continue Rhythmol, diltiazem, and Carvedilol    6.     Hypokalemia   Replace K+   Check Magnesium Level     7.     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     DVT Prophylaxis   Lovenox        Code Status:     FULL CODE     Family Communication:   Daughter at Bedside  Disposition  Plan:    Inpatient Status        Time spent:  [facility] Pager  [phone]   If [place] Please Contact the Day Rounding Team MD for [facility]  If 7PM-7AM, Please Contact Night-Floor Coverage  [email or web address] Password [email or web address] 10/10/2015, 10:33 PM     ADDENDUM:   Patient was seen and examined on 10/10/2015

## 2015-10-10 NOTE — ED Notes (Signed)
REPORT WILL BE GIVEN ON THE FLOOR. 1419-1. AAOX3. PT IN NO APPARENT DISTRESS OR PAIN. THE OPPORTUNITY TO ASK QUESTIONS WAS PROVIDED.

## 2015-10-10 NOTE — ED Notes (Signed)
Pt c/o SOB, cough with green mucus, body aches, abdominal pains onset Monday, pt states she rode a train on Friday and on Monday.

## 2015-10-11 ENCOUNTER — Encounter (HOSPITAL_COMMUNITY): Payer: Self-pay | Admitting: *Deleted

## 2015-10-11 LAB — VITAMIN B12: Vitamin B-12: 649 pg/mL (ref 180–914)

## 2015-10-11 LAB — BASIC METABOLIC PANEL
ANION GAP: 12 (ref 5–15)
BUN: 41 mg/dL — ABNORMAL HIGH (ref 6–20)
CO2: 29 mmol/L (ref 22–32)
CREATININE: 1.64 mg/dL — AB (ref 0.44–1.00)
Calcium: 9 mg/dL (ref 8.9–10.3)
Chloride: 102 mmol/L (ref 101–111)
GFR calc Af Amer: 39 mL/min — ABNORMAL LOW (ref >60)
GFR calc non Af Amer: 34 mL/min — ABNORMAL LOW (ref >60)
GLUCOSE: 99 mg/dL (ref 65–99)
Potassium: 3.4 mmol/L — ABNORMAL LOW (ref 3.5–5.1)
Sodium: 143 mmol/L (ref 135–145)

## 2015-10-11 LAB — CBC
HEMATOCRIT: 27.7 % — AB (ref 36.0–46.0)
HEMOGLOBIN: 8.3 g/dL — AB (ref 12.0–15.0)
MCH: 28.3 pg (ref 26.0–34.0)
MCHC: 30 g/dL (ref 30.0–36.0)
MCV: 94.5 fL (ref 78.0–100.0)
Platelets: 207 10*3/uL (ref 150–400)
RBC: 2.93 MIL/uL — AB (ref 3.87–5.11)
RDW: 20 % — ABNORMAL HIGH (ref 11.5–15.5)
WBC: 7.6 10*3/uL (ref 4.0–10.5)

## 2015-10-11 LAB — RETICULOCYTES
RBC.: 2.97 MIL/uL — AB (ref 3.87–5.11)
RETIC COUNT ABSOLUTE: 154.4 10*3/uL (ref 19.0–186.0)
Retic Ct Pct: 5.2 % — ABNORMAL HIGH (ref 0.4–3.1)

## 2015-10-11 LAB — IRON AND TIBC
Iron: 47 ug/dL (ref 28–170)
Saturation Ratios: 14 % (ref 10.4–31.8)
TIBC: 342 ug/dL (ref 250–450)
UIBC: 295 ug/dL

## 2015-10-11 LAB — INFLUENZA PANEL BY PCR (TYPE A & B)
H1N1FLUPCR: NOT DETECTED
INFLAPCR: NEGATIVE
Influenza B By PCR: NEGATIVE

## 2015-10-11 LAB — FERRITIN: FERRITIN: 71 ng/mL (ref 11–307)

## 2015-10-11 LAB — FOLATE: FOLATE: 49.8 ng/mL (ref >5.9)

## 2015-10-11 MED ORDER — IPRATROPIUM-ALBUTEROL 0.5-2.5 (3) MG/3ML IN SOLN
3.0000 mL | Freq: Four times a day (QID) | RESPIRATORY_TRACT | Status: DC
  Administered 2015-10-11 – 2015-10-12 (×5): 3 mL via RESPIRATORY_TRACT
  Filled 2015-10-11 (×5): qty 3

## 2015-10-11 MED ORDER — ALBUTEROL SULFATE (2.5 MG/3ML) 0.083% IN NEBU: 2.5000 mg | INHALATION_SOLUTION | RESPIRATORY_TRACT | Status: DC | PRN

## 2015-10-11 MED ORDER — INFLUENZA VAC SPLIT QUAD 0.5 ML IM SUSY
0.5000 mL | PREFILLED_SYRINGE | INTRAMUSCULAR | Status: AC
  Administered 2015-10-12: 0.5 mL via INTRAMUSCULAR
  Filled 2015-10-11 (×2): qty 0.5

## 2015-10-11 MED ORDER — POTASSIUM CHLORIDE CRYS ER 20 MEQ PO TBCR
40.0000 meq | EXTENDED_RELEASE_TABLET | Freq: Once | ORAL | Status: AC
  Administered 2015-10-11: 40 meq via ORAL
  Filled 2015-10-11: qty 2

## 2015-10-11 NOTE — Progress Notes (Signed)
Progress Note   [name] [id number] DOB: 07/01/1956 DOA: 10/10/2015 PCP: Kerin Perna, NP   Brief Narrative:   Paige Marshall is an 59 y.o. female with a PMH of asthma, chronic systolic CHF, PAF, stage III CK D who was admitted 10/11/15 with a chief complaint of a 5 day history of shortness of breath, productive cough, wheezing, myalgias and nausea/vomiting. Upon initial evaluation in the ED, she was hypoxic with a room air saturation of 87%. Chest x-ray showed mild central venous congestion but was otherwise unremarkable.  Assessment/Plan:   Principal Problem:   Asthma exacerbation/acute respiratory failure with hypoxia/acute bronchitis versus early pneumonia - Continue supplemental oxygen, steroids, nebulized bronchodilator therapy, and empiric antibiotics.  Active Problems:   Essential hypertension - Continue Lasix, metolazone, diltiazem and carvedilol.    PAF (paroxysmal atrial fibrillation) (HCC) - Rate/rhythm controlled with Rythmol, diltiazem and carvedilol. - NSR with first degree AVB on telemetry.    Hypokalemia - Supplement.    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    DVT Prophylaxis - Lovenox ordered.   Family Communication/Anticipated D/C date and plan/Code Status   Family Communication: Family updated at the bedside. Disposition Plan: Home when respiratory status stable. Anticipated D/C date:   1-2 days. Code Status: Full code.   IV Access:    Peripheral IV   Procedures and diagnostic studies:   Dg Chest 2 View  10/10/2015  CLINICAL DATA:  Shortness of breath, chest pain, cough. Nausea and vomiting for 2-3 days. History of CHF. EXAM: CHEST  2 VIEW COMPARISON:  Chest x-ray dated 01/14/2015. FINDINGS: The marked cardiomegaly is unchanged. Central pulmonary vascular congestion is unchanged, likely chronic. No new lung findings. No  overt pulmonary edema. No pleural effusions seen. No pneumothorax. Osseous and soft tissue structures about the chest are unremarkable. IMPRESSION: Marked cardiomegaly, unchanged. Mild central pulmonary vascular congestion appears stable, presumably a mild chronic CHF. No overt alveolar pulmonary edema. No acute lung findings seen. Electronically Signed   By: Franki Cabot M.D.   On: 10/10/2015 15:46     Medical Consultants:    None.  Anti-Infectives:   Levaquin 10/10/15--->  Subjective:   Glendell Marshall feels a bit better but still is somewhat short of breath with wheezing and coughing. No nausea or vomiting.  Objective:    Filed Vitals:   10/10/15 2343 10/11/15 0028 10/11/15 0554 10/11/15 0750  BP: 144/78  143/80   Pulse: 71  73 76  Temp: 97.5 F (36.4 C)  97.9 F (36.6 C)   TempSrc: Oral  Oral   Resp: 22  22 20   Height: 5' 7.5" (1.715 m)     Weight: 158.7 kg (349 lb 13.9 oz)     SpO2: 95% 99% 95% 94%    Intake/Output Summary (Last 24 hours) at 10/11/15 0840 Last data filed at 10/11/15 0600  Gross per 24 hour  Intake    240 ml  Output   1000 ml  Net   -760 ml   Filed Weights   10/10/15 2343  Weight: 158.7 kg (349 lb 13.9 oz)    Exam: Gen:  NAD Cardiovascular:  RRR, No M/R/G Respiratory:  Lungs coarse with scattered wheezes Gastrointestinal:  Abdomen soft, NT/ND, + BS Extremities:  No C/E/C   Data Reviewed:    Labs: Basic Metabolic Panel:  Recent Labs Lab 10/10/15 1944 10/11/15 0559  NA 145  143  K 3.2* 3.4*  CL 105 102  CO2 28 29  GLUCOSE 128* 99  BUN 41* 41*  CREATININE 1.88* 1.64*  CALCIUM 9.3 9.0   GFR Estimated Creatinine Clearance: 60.5 mL/min (by C-G formula based on Cr of 1.64). Liver Function Tests:  Recent Labs Lab 10/10/15 1944  AST 22  ALT 18  ALKPHOS 62  BILITOT 1.5*  PROT 7.8  ALBUMIN 4.1    Recent Labs Lab 10/10/15 1944  LIPASE 27   CBC:  Recent Labs Lab 10/10/15 1944 10/11/15 0559  WBC 6.2 7.6    NEUTROABS 5.1  --   HGB 8.2* 8.3*  HCT 27.2* 27.7*  MCV 94.8 94.5  PLT 211 207    Anemia work up:  Recent Labs  10/11/15 0559  RETICCTPCT 5.2*   Microbiology No results found for this or any previous visit (from the past 240 hour(s)).   Medications:   . albuterol  5 mg Nebulization Once  . allopurinol  300 mg Oral Daily  . carvedilol  25 mg Oral BID WC  . diltiazem  180 mg Oral Daily  . enoxaparin (LOVENOX) injection  80 mg Subcutaneous QHS  . furosemide  80 mg Oral BID  . [START ON 10/12/2015] Influenza vac split quadrivalent PF  0.5 mL Intramuscular Tomorrow-1000  . ipratropium-albuterol  3 mL Nebulization QID  . levofloxacin  750 mg Oral QHS  . metolazone  2.5 mg Oral BID  . omega-3 acid ethyl esters  1 g Oral Daily  . propafenone  150 mg Oral 3 times per day  . sodium chloride  3 mL Intravenous Q12H   Continuous Infusions:   Time spent: 25 minutes.   LOS: 1 day   RAMA,CHRISTINA  [facility] Pager [phone]. If unable to reach me by pager, please call my cell phone at [phone].  *Please refer to [email or web address], password [email or web address] to get updated schedule on who will round on this patient, as hospitalists switch teams weekly. If 7PM-7AM, please contact night-coverage at [email or web address], password [email or web address] for any overnight needs.  10/11/2015, 8:40 AM

## 2015-10-12 DIAGNOSIS — J189 Pneumonia, unspecified organism: Secondary | ICD-10-CM | POA: Diagnosis not present

## 2015-10-12 MED ORDER — LEVOFLOXACIN 750 MG PO TABS: 750.0000 mg | ORAL_TABLET | Freq: Every day | ORAL | Status: DC

## 2015-10-12 MED ORDER — IPRATROPIUM-ALBUTEROL 0.5-2.5 (3) MG/3ML IN SOLN: 3.0000 mL | Freq: Three times a day (TID) | RESPIRATORY_TRACT | Status: DC

## 2015-10-12 MED ORDER — OXYCODONE HCL 5 MG PO TABS: 5.0000 mg | ORAL_TABLET | Freq: Four times a day (QID) | ORAL | Status: DC | PRN

## 2015-10-12 NOTE — Progress Notes (Signed)
Reviewed discharge information with patient and caregiver. Answered all questions. Patient/caregiver able to teach back medications and reasons to contact MD/911. Patient verbalizes importance of PCP follow up appointment.  Josephus Harriger M. Deina Lipsey, RN  

## 2015-10-12 NOTE — Discharge Instructions (Signed)

## 2015-10-12 NOTE — Discharge Summary (Signed)
Physician Discharge Summary  Paige Marshall [id number] DOB: 10/05/1956 DOA: 10/10/2015  PCP: Kerin Perna, NP  Admit date: 10/10/2015 Discharge date: 10/12/2015   Recommendations for Outpatient Follow-Up:   1. F/U with PCP in 2 weeks to ensure resolution of bronchospasm.   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    Discharge Condition: Improved.  Diet recommendation: Low sodium, heart healthy.     History of Present Illness:   Paige Marshall is an 60 y.o. female with a PMH of asthma, chronic systolic CHF, PAF, stage III CK D who was admitted 10/11/15 with a chief complaint of a 5 day history of shortness of breath, productive cough, wheezing, myalgias and nausea/vomiting. Upon initial evaluation in the ED, she was hypoxic with a room air saturation of 87%. Chest x-ray showed mild central venous congestion but was otherwise unremarkable.  Hospital Course by Problem:   Principal Problem:  Asthma exacerbation/acute respiratory failure with hypoxia/acute bronchitis versus early pneumonia - Treated with supplemental oxygen, steroids, nebulized bronchodilator therapy, and empiric antibiotics. - D/C on 5 more days of Levaquin.  Bronchospasm resolved at D/C.  Active Problems:  Essential hypertension - Continue Lasix, metolazone, diltiazem and carvedilol.   PAF (paroxysmal atrial fibrillation) (HCC) - Rate/rhythm controlled with Rythmol, diltiazem and carvedilol. - NSR with first degree AVB on telemetry.   Hypokalemia - Supplement.   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    Medical Consultants:    None.   Discharge Exam:   Filed Vitals:   10/12/15 0218 10/12/15 0556  BP: 151/73 166/86  Pulse: 66 63  Temp: 97.8 F (36.6 C) 97.9 F (36.6 C)  Resp: 18 18   Filed Vitals:   10/11/15 2143 10/12/15 0218 10/12/15 0556 10/12/15 0753  BP: 161/82 151/73 166/86   Pulse: 74 66 63   Temp: 98.1 F (36.7 C) 97.8 F (36.6 C) 97.9 F (36.6 C)   TempSrc: Oral Oral Oral   Resp: 20 18 18    Height:      Weight:   158.6 kg (349 lb 10.4 oz)   SpO2: 97% 98% 98% 97%    Gen:  NAD Cardiovascular:  RRR, No M/R/G Respiratory: Lungs CTAB Gastrointestinal: Abdomen soft, NT/ND with normal active bowel sounds. Extremities: No C/E/C   The results of significant diagnostics from this hospitalization (including imaging, microbiology, ancillary and laboratory) are listed below for reference.     Procedures and Diagnostic Studies:   Dg Chest 2 View  10/10/2015  CLINICAL DATA:  Shortness of breath, chest pain, cough. Nausea and vomiting for 2-3 days. History of CHF. EXAM: CHEST  2 VIEW COMPARISON:  Chest x-ray dated 01/14/2015. FINDINGS: The marked cardiomegaly is unchanged. Central pulmonary vascular congestion is unchanged, likely chronic. No new lung findings. No overt pulmonary edema. No pleural effusions seen. No pneumothorax. Osseous and soft tissue structures about the chest are unremarkable. IMPRESSION: Marked cardiomegaly, unchanged. Mild central pulmonary vascular congestion appears stable, presumably a mild chronic CHF. No overt alveolar pulmonary edema. No acute lung findings seen. Electronically  Signed   By: Franki Cabot M.D.   On: 10/10/2015 15:46     Labs:   Basic Metabolic Panel:  Recent Labs Lab 10/10/15 1944 10/11/15 0559  NA 145 143  K 3.2* 3.4*  CL 105 102  CO2 28 29  GLUCOSE 128* 99  BUN 41* 41*  CREATININE 1.88* 1.64*  CALCIUM 9.3 9.0   GFR Estimated Creatinine Clearance: 60.4 mL/min  (by C-G formula based on Cr of 1.64). Liver Function Tests:  Recent Labs Lab 10/10/15 1944  AST 22  ALT 18  ALKPHOS 62  BILITOT 1.5*  PROT 7.8  ALBUMIN 4.1    Recent Labs Lab 10/10/15 1944  LIPASE 27   No results for input(s): AMMONIA in the last 168 hours. Coagulation profile No results for input(s): INR, PROTIME in the last 168 hours.  CBC:  Recent Labs Lab 10/10/15 1944 10/11/15 0559  WBC 6.2 7.6  NEUTROABS 5.1  --   HGB 8.2* 8.3*  HCT 27.2* 27.7*  MCV 94.8 94.5  PLT 211 207   Anemia work up  Recent Labs  10/11/15 0559  VITAMINB12 649  FOLATE 49.8  FERRITIN 71  TIBC 342  IRON 47  RETICCTPCT 5.2*   Microbiology No results found for this or any previous visit (from the past 240 hour(s)).   Discharge Instructions:   Discharge Instructions    (HEART FAILURE PATIENTS) Call MD:  Anytime you have any of the following symptoms: 1) 3 pound weight gain in 24 hours or 5 pounds in 1 week 2) shortness of breath, with or without a dry hacking cough 3) swelling in the hands, feet or stomach 4) if you have to sleep on extra pillows at night in order to breathe.    Complete by:  As directed      Diet - low sodium heart healthy    Complete by:  As directed      Discharge instructions    Complete by:  As directed   You were treated for pneumonia while you were in the hospital.  It is important that you see your PCP in follow up, and have him/her order a follow up chest x-ray in 4-6 weeks to ensure resolution of the pneumonia and to exclude any underlying pathology.  Take all of your antibiotics, as prescribed, even if you feel better.  Do not discontinue antibiotics prematurely.  You were treated for heart failure in the hospital.  To prevent exacerbations of your heart failure, it is important that you check your weight at the same time every day, and that if you gain over 3 pounds in 24 hours or 5 lbs in 1 week, OR you develop worsening swelling to the legs,  experience more shortness of breath or chest pain, take an extra dose of Lasix and call your Primary MD or cardiologist.   Follow a heart healthy, low salt diet and restrict your fluid intake to 1.5 liters a day or less.     Increase activity slowly    Complete by:  As directed             Medication List    STOP taking these medications        lisinopril 20 MG tablet  Commonly known as:  PRINIVIL,ZESTRIL      TAKE these medications        albuterol 108 (90 Base) MCG/ACT inhaler  Commonly known as:  PROVENTIL HFA;VENTOLIN HFA  Inhale 2 puffs into the lungs every 6 (six)  hours as needed for wheezing or shortness of breath.     allopurinol 300 MG tablet  Commonly known as:  ZYLOPRIM  Take 1 tablet (300 mg total) by mouth daily.     carvedilol 25 MG tablet  Commonly known as:  COREG  TAKE 2 TABLETS (50 MG TOTAL) BY MOUTH 2 (TWO) TIMES DAILY.     diclofenac 75 MG EC tablet  Commonly known as:  VOLTAREN  Take 75 mg by mouth 2 (two) times daily with a meal.     diltiazem 180 MG 24 hr capsule  Commonly known as:  DILACOR XR  TAKE 1 CAPSULE (180 MG TOTAL) BY MOUTH DAILY.     FISH OIL CONCENTRATE PO  Take 1 capsule by mouth daily.     furosemide 80 MG tablet  Commonly known as:  LASIX  TAKE 1 TABLET (80 MG TOTAL) BY MOUTH 2 (TWO) TIMES DAILY.     levofloxacin 750 MG tablet  Commonly known as:  LEVAQUIN  Take 1 tablet (750 mg total) by mouth at bedtime.     metolazone 2.5 MG tablet  Commonly known as:  ZAROXOLYN  TAKE 1 TABLET (2.5 MG TOTAL) BY MOUTH 2 (TWO) TIMES DAILY. HALF HOUR BEFORE LASIX DOSE     multivitamin with minerals Tabs tablet  Take 1 tablet by mouth daily.     oxyCODONE 5 MG immediate release tablet  Commonly known as:  Oxy IR/ROXICODONE  Take 1 tablet (5 mg total) by mouth every 6 (six) hours as needed for moderate pain.     propafenone 150 MG tablet  Commonly known as:  RYTHMOL  TAKE 1 TABLET (150 MG TOTAL) BY MOUTH EVERY 8 (EIGHT) HOURS.             Follow-up Information    Follow up with EDWARDS, MICHELLE P, NP. Schedule an appointment as soon as possible for a visit in 2 weeks.   Specialty:  Internal Medicine   Why:  Hospital follow up   Contact information:   [place] [address] [phone]        Time coordinating discharge: 35 minutes.  Signed:  RAMA,CHRISTINA  Pager [phone] [facility] 10/12/2015, 11:13 AM

## 2015-10-24 ENCOUNTER — Ambulatory Visit (INDEPENDENT_AMBULATORY_CARE_PROVIDER_SITE_OTHER): Payer: Medicare Other | Admitting: Internal Medicine

## 2015-10-24 ENCOUNTER — Encounter: Payer: Self-pay | Admitting: Internal Medicine

## 2015-10-24 VITALS — BP 114/60 | HR 51 | Ht 67.5 in | Wt 341.8 lb

## 2015-10-24 DIAGNOSIS — J4520 Mild intermittent asthma, uncomplicated: Secondary | ICD-10-CM

## 2015-10-24 DIAGNOSIS — J9601 Acute respiratory failure with hypoxia: Secondary | ICD-10-CM

## 2015-10-24 DIAGNOSIS — J9611 Chronic respiratory failure with hypoxia: Secondary | ICD-10-CM | POA: Diagnosis not present

## 2015-10-24 DIAGNOSIS — G4733 Obstructive sleep apnea (adult) (pediatric): Secondary | ICD-10-CM | POA: Diagnosis not present

## 2015-10-24 MED ORDER — ALBUTEROL SULFATE HFA 108 (90 BASE) MCG/ACT IN AERS: 2.0000 | INHALATION_SPRAY | Freq: Four times a day (QID) | RESPIRATORY_TRACT | Status: DC | PRN

## 2015-10-24 NOTE — Patient Instructions (Signed)
Script sent for albuterol rescue inhaler  Order- schedule PFT     Dx  Asthmatic bronchitis mild intermittent  Order- schedule   ONOX room air

## 2015-10-24 NOTE — Assessment & Plan Note (Signed)
Significantly overweight complicated by arthritis in knees limiting mobility. Suggest bariatric counseling

## 2015-10-24 NOTE — Assessment & Plan Note (Signed)
She did smoke from age 60 until about age 15. Unclear how much dyspnea is due to heart disease versus lung disease. Plan-full PFT, overnight oximetry, refill rescue inhaler

## 2015-10-24 NOTE — Assessment & Plan Note (Signed)
Using oxygen 2 L/[facility] only for sleep, initially prescribed when hospitalized in April 2016 for congestive heart failure. Plan-overnight oximetry in room air

## 2015-10-24 NOTE — Progress Notes (Signed)
10/24/2015-60 year old female former smoker referred courtesy of  Row[facility]; Had sleep study 01-2010. Wears O2 at night only-given at [facility] hospital about 1 year ago. Sister here NPSG 01/27/10 (Dr Claiborne Billings)  AHI 10/ hr, desat to 62%. Never fitted with CPAP. Medical history including HBP, PAF, morbid obesity, systolic and diastolic CHF, asthma/bronchitis O2 sleep/2L/ [facility] Patient had mild sleep apnea study at cardiology lab in 2011. Never fitted with CPAP. She is aware that she snores loudly and admits daytime sleepiness. She had worked for many years third shift and has An irregular sleep habits with bedtime around 1 or 2 AM, up an hour or 2 a time before finally up around 1 PM. Estimates 5 hours of sleep per day. No ENT surgery. Placed on oxygen when hospitalized April 2016 for CHF. She doesn't know of diagnosed lung disease other than some asthma for which she uses a rescue inhaler once or twice every few weeks. No routine cough or wheeze. She asks why she needs oxygen at night.  Prior to Admission medications   Medication Sig Start Date End Date Taking? Authorizing Provider  albuterol (PROVENTIL HFA;VENTOLIN HFA) 108 (90 Base) MCG/ACT inhaler Inhale 2 puffs into the lungs every 6 (six) hours as needed for wheezing or shortness of breath. 10/24/15  Yes Deneise Lever, MD  allopurinol (ZYLOPRIM) 300 MG tablet Take 1 tablet (300 mg total) by mouth daily. 01/21/15  Yes Nishant Dhungel, MD  carvedilol (COREG) 25 MG tablet TAKE 2 TABLETS (50 MG TOTAL) BY MOUTH 2 (TWO) TIMES DAILY. 08/20/15  Yes Lorretta Harp, MD  diclofenac (VOLTAREN) 75 MG EC tablet Take 75 mg by mouth 2 (two) times daily with a meal. 03/22/15  Yes Historical Provider, MD  diltiazem (DILACOR XR) 180 MG 24 hr capsule TAKE 1 CAPSULE (180 MG TOTAL) BY MOUTH DAILY. 06/24/15  Yes Lorretta Harp, MD  furosemide (LASIX) 80 MG tablet TAKE 1 TABLET (80 MG TOTAL) BY MOUTH 2 (TWO) TIMES DAILY. 06/24/15  Yes Lorretta Harp, MD  metolazone  (ZAROXOLYN) 2.5 MG tablet TAKE 1 TABLET (2.5 MG TOTAL) BY MOUTH 2 (TWO) TIMES DAILY. HALF HOUR BEFORE LASIX DOSE 06/24/15  Yes Lorretta Harp, MD  Multiple Vitamin (MULTIVITAMIN WITH MINERALS) TABS tablet Take 1 tablet by mouth daily.   Yes Historical Provider, MD  Omega-3 Fatty Acids (FISH OIL CONCENTRATE PO) Take 1 capsule by mouth daily.    Yes Historical Provider, MD  oxyCODONE (OXY IR/ROXICODONE) 5 MG immediate release tablet Take 1 tablet (5 mg total) by mouth every 6 (six) hours as needed for moderate pain. 10/12/15  Yes Christina P Rama, MD  propafenone (RYTHMOL) 150 MG tablet TAKE 1 TABLET (150 MG TOTAL) BY MOUTH EVERY 8 (EIGHT) HOURS. 06/23/15  Yes Pixie Casino, MD   Past Medical History  Diagnosis Date  . CHF (congestive heart failure) ([place])   . Hypertension   . Paroxysmal atrial fibrillation ([facility])   . High cholesterol   . Former smoker   . Dyspnea     progressive  . Edema     Lower extremity  . Coronary artery disease   . Asthma   . Pneumonia X 2  . OSA (obstructive sleep apnea)     "never sent CPAP out" (01/14/2015)  . Arthritis     "both knees" (01/14/2015)  . Degenerative joint disease of both lower legs   . Gout     hx in "both knees" (01/14/2015)  . Nonischemic cardiomyopathy ([place])  Past Surgical History  Procedure Laterality Date  . Breath tek h pylori N/A 12/31/2013    Procedure: BREATH TEK H PYLORI;  Surgeon: Gayland Curry, MD;  Location: [facility];  Service: General;  Laterality: N/A;  . Doppler echocardiography      2 D   EF of 45%  . Angiogram/lv (congenital)  2007  . Sleep study  2011  . Stress myocardial dipyridamole perfusion    . Venous duplex ultrasound  2013  . Coronary angioplasty with stent placement  11/04/2005    "1"  . Tubal ligation  1982  . Eye surgery    . Corneal transplant Right ~ 2010  . Left heart catheterization with coronary angiogram N/A 01/20/2015    Procedure: LEFT HEART CATHETERIZATION WITH CORONARY ANGIOGRAM;  Surgeon: Lorretta Harp, MD;  Location: [facility] CATH LAB;  Service: Cardiovascular;  Laterality: N/A;   Family History  Problem Relation Age of Onset  . Heart disease Mother   . Cancer Father     colon  . Hypertension Other    Social History   Social History  . Marital Status: Widowed    Spouse Name: N/A  . Number of Children: N/A  . Years of Education: N/A   Occupational History  . Not on file.   Social History Main Topics  . Smoking status: Former Smoker -- 0.33 packs/day for 35 years    Types: Cigarettes    Quit date: 10/11/2008  . Smokeless tobacco: Never Used  . Alcohol Use: 4.8 oz/week    8 Cans of beer per week     Comment: 01/14/2015 "~ 4, 24oz cans beer/wk"  . Drug Use: No  . Sexual Activity: No   Other Topics Concern  . Not on file   Social History Narrative   ROS-see HPI   Negative unless "+" Constitutional:    weight loss, night sweats, fevers, chills, fatigue, lassitude. HEENT:    headaches, difficulty swallowing, tooth/dental problems, sore throat,       sneezing, itching, ear ache, nasal congestion, post nasal drip, snoring CV:    chest pain, orthopnea, PND, swelling in lower extremities, anasarca,                                                        dizziness, palpitations Resp:   + shortness of breath with exertion or at rest.                productive cough,   non-productive cough, coughing up of blood.              change in color of mucus.  wheezing.   Skin:    rash or lesions. GI:  No-   heartburn, indigestion, abdominal pain, nausea, vomiting, diarrhea,                 change in bowel habits, loss of appetite GU: dysuria, change in color of urine, no urgency or frequency.   flank pain. MS:   joint pain, stiffness, decreased range of motion, back pain. Neuro-     nothing unusual Psych:  change in mood or affect.  depression or anxiety.   memory loss.  OBJ- Physical Exam   in wheelchair to come to this office because of arthritis in knees General- Alert, Oriented,  Affect-appropriate, Distress-  none acute, + obese Skin- rash-none, lesions- none, excoriation- none Lymphadenopathy- none Head- atraumatic            Eyes- Gross vision intact, PERRLA, conjunctivae and secretions clear            Ears- Hearing, canals-normal            Nose- Clear, no-Septal dev, mucus, polyps, erosion, perforation             Throat- Mallampati II , mucosa clear , drainage- none, tonsils- atrophic Neck- flexible , trachea midline, no stridor , thyroid nl, carotid no bruit Chest - symmetrical excursion , unlabored           Heart/CV- RRR , no murmur , no gallop  , no rub, nl s1 s2                           - JVD- none , edema- none, stasis changes- none, varices- none           Lung- clear to P&A, wheeze- none, cough- none , dullness-none, rub- none           Chest wall-  Abd-  Br/ Gen/ Rectal- Not done, not indicated Extrem- cyanosis- none, clubbing, none, atrophy- none, strength- nl Neuro- grossly intact to observation

## 2015-11-30 ENCOUNTER — Emergency Department (INDEPENDENT_AMBULATORY_CARE_PROVIDER_SITE_OTHER)
Admission: EM | Admit: 2015-11-30 | Discharge: 2015-11-30 | Disposition: A | Discharge status: Home / Self Care | Payer: Medicare Other | Source: Home / Self Care | Attending: Family Medicine | Admitting: Family Medicine

## 2015-11-30 ENCOUNTER — Encounter (HOSPITAL_COMMUNITY): Payer: Self-pay | Admitting: Emergency Medicine

## 2015-11-30 DIAGNOSIS — R21 Rash and other nonspecific skin eruption: Secondary | ICD-10-CM | POA: Diagnosis not present

## 2015-11-30 MED ORDER — PREDNISONE 50 MG PO TABS: 50.0000 mg | ORAL_TABLET | Freq: Every day | ORAL | Status: DC

## 2015-11-30 MED ORDER — METHYLPREDNISOLONE SODIUM SUCC 125 MG IJ SOLR
125.0000 mg | Freq: Once | INTRAMUSCULAR | Status: AC
  Administered 2015-11-30: 125 mg via INTRAMUSCULAR

## 2015-11-30 MED ORDER — METHYLPREDNISOLONE SODIUM SUCC 125 MG IJ SOLR
INTRAMUSCULAR | Status: AC
  Filled 2015-11-30: qty 2

## 2015-11-30 NOTE — ED Notes (Signed)
C/o rash all over body onset x1 week... Denies fevers A&O x4... No acute distress.

## 2015-11-30 NOTE — ED Provider Notes (Signed)
CSN: [id number]     Arrival date & time 11/30/15  1600 History   First MD Initiated Contact with Patient 11/30/15 1737     Chief Complaint  Patient presents with  . Rash   (Consider location/radiation/quality/duration/timing/severity/associated sxs/prior Treatment) HPI  Past Medical History  Diagnosis Date  . CHF (congestive heart failure) ([place])   . Hypertension   . Paroxysmal atrial fibrillation (H[facility])   . High cholesterol   . Former smoker   . Dyspnea     progressive  . Edema     Lower extremity  . Coronary artery disease   . Asthma   . Pneumonia X 2  . OSA (obstructive sleep apnea)     "never sent CPAP out" (01/14/2015)  . Arthritis     "both knees" (01/14/2015)  . Degenerative joint disease of both lower legs   . Gout     hx in "both knees" (01/14/2015)  . Nonischemic cardiomyopathy [facility])    Past Surgical History  Procedure Laterality Date  . Breath tek h pylori N/A 12/31/2013    Procedure: BREATH TEK H PYLORI;  Surgeon: Gayland Curry, MD;  Location: [facility];  Service: General;  Laterality: N/A;  . Doppler echocardiography      2 D   EF of 45%  . Angiogram/lv (congenital)  2007  . Sleep study  2011  . Stress myocardial dipyridamole perfusion    . Venous duplex ultrasound  2013  . Coronary angioplasty with stent placement  11/04/2005    "1"  . Tubal ligation  1982  . Eye surgery    . Corneal transplant Right ~ 2010  . Left heart catheterization with coronary angiogram N/A 01/20/2015    Procedure: LEFT HEART CATHETERIZATION WITH CORONARY ANGIOGRAM;  Surgeon: Lorretta Harp, MD;  Location[facility] CATH LAB;  Service: Cardiovascular;  Laterality: N/A;   Family History  Problem Relation Age of Onset  . Heart disease Mother   . Cancer Father     colon  . Hypertension Other    Social History  Substance Use Topics  . Smoking status: Former Smoker -- 0.33 packs/day for 35 years    Types: Cigarettes    Quit date: 10/11/2008  . Smokeless tobacco: Never Used  . Alcohol  Use: 4.8 oz/week    8 Cans of beer per week     Comment: 01/14/2015 "~ 4, 24oz cans beer/wk"   OB History    No data available     Review of Systems  Allergies  Penicillins  Home Medications   Prior to Admission medications   Medication Sig Start Date End Date Taking? Authorizing Provider  albuterol (PROVENTIL HFA;VENTOLIN HFA) 108 (90 Base) MCG/ACT inhaler Inhale 2 puffs into the lungs every 6 (six) hours as needed for wheezing or shortness of breath. 10/24/15  Yes Deneise Lever, MD  allopurinol (ZYLOPRIM) 300 MG tablet Take 1 tablet (300 mg total) by mouth daily. 01/21/15  Yes Nishant Dhungel, MD  carvedilol (COREG) 25 MG tablet TAKE 2 TABLETS (50 MG TOTAL) BY MOUTH 2 (TWO) TIMES DAILY. 08/20/15  Yes Lorretta Harp, MD  diclofenac (VOLTAREN) 75 MG EC tablet Take 75 mg by mouth 2 (two) times daily with a meal. 03/22/15  Yes Historical Provider, MD  diltiazem (DILACOR XR) 180 MG 24 hr capsule TAKE 1 CAPSULE (180 MG TOTAL) BY MOUTH DAILY. 06/24/15  Yes Lorretta Harp, MD  furosemide (LASIX) 80 MG tablet TAKE 1 TABLET (80 MG TOTAL) BY MOUTH 2 (TWO)  TIMES DAILY. 06/24/15  Yes Lorretta Harp, MD  metolazone (ZAROXOLYN) 2.5 MG tablet TAKE 1 TABLET (2.5 MG TOTAL) BY MOUTH 2 (TWO) TIMES DAILY. HALF HOUR BEFORE LASIX DOSE 06/24/15  Yes Lorretta Harp, MD  Multiple Vitamin (MULTIVITAMIN WITH MINERALS) TABS tablet Take 1 tablet by mouth daily.   Yes Historical Provider, MD  Omega-3 Fatty Acids (FISH OIL CONCENTRATE PO) Take 1 capsule by mouth daily.    Yes Historical Provider, MD  propafenone (RYTHMOL) 150 MG tablet TAKE 1 TABLET (150 MG TOTAL) BY MOUTH EVERY 8 (EIGHT) HOURS. 06/23/15  Yes Pixie Casino, MD  oxyCODONE (OXY IR/ROXICODONE) 5 MG immediate release tablet Take 1 tablet (5 mg total) by mouth every 6 (six) hours as needed for moderate pain. 10/12/15   Christina P Rama, MD  predniSONE (DELTASONE) 50 MG tablet Take 1 tablet (50 mg total) by mouth daily. 11/30/15   Konrad Felix, PA    Meds Ordered and Administered this Visit   Medications  methylPREDNISolone sodium succinate (SOLU-MEDROL) 125 mg/2 mL injection 125 mg (125 mg Intramuscular Given 11/30/15 1826)    BP 122/65 mmHg  Pulse 86  Temp(Src) 98.1 F (36.7 C) (Oral)  Resp 16  SpO2 99%  LMP 09/07/2011 No data found.   Physical Exam  Constitutional: She is oriented to person, place, and time. She appears well-developed and well-nourished.  HENT:  Head: Normocephalic and atraumatic.  Pulmonary/Chest: Effort normal.  Musculoskeletal: Normal range of motion.  Neurological: She is alert and oriented to person, place, and time.  Skin: Skin is warm and dry. Rash noted.  Patient has several red nontender lesions on the right leg left arm low back and abdomen lesion pattern does not fit any particular diagnosis.  Psychiatric: She has a normal mood and affect. Her behavior is normal.    ED Course  Procedures (including critical care time)  Labs Review Labs Reviewed - No data to display  Imaging Review No results found.   Visual Acuity Review  Right Eye Distance:   Left Eye Distance:   Bilateral Distance:    Right Eye Near:   Left Eye Near:    Bilateral Near:      I have explained to patient that I am not sure what this lesion is low we will administer prednisone. If is not better in a couple of days she needs to follow-up with her primary care provider for possible referral to dermatology. Patient is administered a dose of Solu-Medrol here in the department prior to discharge she can start prednisone tomorrow  MDM   1. Rash and nonspecific skin eruption    Patient is advised to continue home symptomatic treatment. Prescription for prednisone  sent pharmacy patient has indicated. Patient is advised that if there are new or worsening symptoms or attend the emergency department, or contact primary care provider. Instructions of care provided discharged home in stable condition. Return to  work/school note provided.  THIS NOTE WAS GENERATED USING A VOICE RECOGNITION SOFTWARE PROGRAM. ALL REASONABLE EFFORTS  WERE MADE TO PROOFREAD THIS DOCUMENT FOR ACCURACY.     Konrad Felix, PA 11/30/15 2042

## 2015-11-30 NOTE — Discharge Instructions (Signed)
Drug Rash °A drug rash is a change in the color or texture of the skin that is caused by a drug. It can develop minutes, hours, or days after the person takes the drug. °CAUSES °This condition is usually caused by a drug allergy. It can also be caused by exposure to sunlight after taking a drug that makes the skin sensitive to light. Drugs that commonly cause rashes include: °· Penicillin. °· Antibiotic medicines. °· Medicines that treat seizures. °· Medicines that treat cancer (chemotherapy). °· Aspirin and other nonsteroidal anti-inflammatory drugs (NSAIDs). °· Injectable dyes that contain iodine. °· Insulin. °SYMPTOMS °Symptoms of this condition include: °· Redness. °· Tiny bumps. °· Peeling. °· Itching. °· Itchy welts (hives). °· Swelling. °The rash may appear on a small area of skin or all over the body. °DIAGNOSIS °To diagnose the condition, your health care provider will do a physical exam. He or she may also order tests to find out which drug caused the rash. Tests to find the cause of a rash include: °· Skin tests. °· Blood tests. °· Drug challenge. For this test, you stop taking all of the drugs that you do not need to take, and then you start taking them again by adding back one of the drugs at a time. °TREATMENT °A drug rash may be treated with medicines, including: °· Antihistamines. These may be given to relieve itching. °· An NSAID. This may be given to reduce swelling and treat pain. °· A steroid drug. This may be given to reduce swelling. °The rash usually goes away when the person stops taking the drug that caused it. °HOME CARE INSTRUCTIONS °· Take medicines only as directed by your health care provider. °· Let all of your health care providers know about any drug reactions you have had in the past. °· If you have hives, take a cool shower or use a cool compress to relieve itchiness. °SEEK MEDICAL CARE IF: °· You have a fever. °· Your rash is not going away. °· Your rash gets worse. °· Your rash  comes back. °· You have wheezing or coughing. °SEEK IMMEDIATE MEDICAL CARE IF: °· You start to have breathing problems. °· You start to have shortness of breath. °· You face or throat starts to swell. °· You have severe weakness with dizziness or fainting. °· You have chest pain. °  °This information is not intended to replace advice given to you by your health care provider. Make sure you discuss any questions you have with your health care provider. °  °Document Released: 11/04/2004 Document Revised: 10/18/2014 Document Reviewed: 07/24/2014 °Elsevier Interactive Patient Education ©2016 [employer] ° °

## 2016-01-04 ENCOUNTER — Other Ambulatory Visit: Payer: Self-pay | Admitting: Internal Medicine

## 2016-01-15 DIAGNOSIS — M79641 Pain in right hand: Secondary | ICD-10-CM | POA: Diagnosis not present

## 2016-01-15 DIAGNOSIS — M79643 Pain in unspecified hand: Secondary | ICD-10-CM | POA: Diagnosis not present

## 2016-01-15 DIAGNOSIS — M79642 Pain in left hand: Secondary | ICD-10-CM | POA: Diagnosis not present

## 2016-01-30 ENCOUNTER — Ambulatory Visit: Payer: Medicare Other | Admitting: Internal Medicine

## 2016-02-01 ENCOUNTER — Other Ambulatory Visit: Payer: Self-pay | Admitting: Cardiovascular Disease

## 2016-02-02 NOTE — Telephone Encounter (Signed)
Rx(s) sent to pharmacy electronically.  

## 2016-02-07 ENCOUNTER — Other Ambulatory Visit: Payer: Self-pay | Admitting: Cardiovascular Disease

## 2016-02-09 NOTE — Telephone Encounter (Signed)
Rx(s) sent to pharmacy electronically.  

## 2016-03-07 ENCOUNTER — Inpatient Hospital Stay (HOSPITAL_COMMUNITY)
Admission: EM | Admit: 2016-03-07 | Discharge: 2016-03-10 | DRG: 683 | Disposition: A | Discharge status: Home / Self Care | Payer: Medicare Other | Attending: Internal Medicine | Admitting: Internal Medicine

## 2016-03-07 ENCOUNTER — Encounter (HOSPITAL_COMMUNITY): Payer: Self-pay | Admitting: Emergency Medicine

## 2016-03-07 DIAGNOSIS — E7800 Pure hypercholesterolemia, unspecified: Secondary | ICD-10-CM | POA: Diagnosis present

## 2016-03-07 DIAGNOSIS — I5022 Chronic systolic (congestive) heart failure: Secondary | ICD-10-CM | POA: Diagnosis present

## 2016-03-07 DIAGNOSIS — M170 Bilateral primary osteoarthritis of knee: Secondary | ICD-10-CM | POA: Diagnosis present

## 2016-03-07 DIAGNOSIS — R109 Unspecified abdominal pain: Secondary | ICD-10-CM | POA: Diagnosis not present

## 2016-03-07 DIAGNOSIS — E876 Hypokalemia: Secondary | ICD-10-CM | POA: Diagnosis not present

## 2016-03-07 DIAGNOSIS — I130 Hypertensive heart and chronic kidney disease with heart failure and stage 1 through stage 4 chronic kidney disease, or unspecified chronic kidney disease: Secondary | ICD-10-CM | POA: Diagnosis present

## 2016-03-07 DIAGNOSIS — J449 Chronic obstructive pulmonary disease, unspecified: Secondary | ICD-10-CM | POA: Diagnosis present

## 2016-03-07 DIAGNOSIS — I2510 Atherosclerotic heart disease of native coronary artery without angina pectoris: Secondary | ICD-10-CM | POA: Diagnosis present

## 2016-03-07 DIAGNOSIS — M109 Gout, unspecified: Secondary | ICD-10-CM | POA: Diagnosis present

## 2016-03-07 DIAGNOSIS — I429 Cardiomyopathy, unspecified: Secondary | ICD-10-CM | POA: Diagnosis present

## 2016-03-07 DIAGNOSIS — E785 Hyperlipidemia, unspecified: Secondary | ICD-10-CM | POA: Diagnosis present

## 2016-03-07 DIAGNOSIS — R0902 Hypoxemia: Secondary | ICD-10-CM | POA: Diagnosis not present

## 2016-03-07 DIAGNOSIS — Z800 Family history of malignant neoplasm of digestive organs: Secondary | ICD-10-CM

## 2016-03-07 DIAGNOSIS — N189 Chronic kidney disease, unspecified: Secondary | ICD-10-CM

## 2016-03-07 DIAGNOSIS — I480 Paroxysmal atrial fibrillation: Secondary | ICD-10-CM | POA: Diagnosis present

## 2016-03-07 DIAGNOSIS — Z87891 Personal history of nicotine dependence: Secondary | ICD-10-CM

## 2016-03-07 DIAGNOSIS — I428 Other cardiomyopathies: Secondary | ICD-10-CM | POA: Diagnosis not present

## 2016-03-07 DIAGNOSIS — Z880 Allergy status to penicillin: Secondary | ICD-10-CM

## 2016-03-07 DIAGNOSIS — E662 Morbid (severe) obesity with alveolar hypoventilation: Secondary | ICD-10-CM | POA: Diagnosis present

## 2016-03-07 DIAGNOSIS — Z6841 Body Mass Index (BMI) 40.0 and over, adult: Secondary | ICD-10-CM | POA: Diagnosis not present

## 2016-03-07 DIAGNOSIS — N179 Acute kidney failure, unspecified: Secondary | ICD-10-CM | POA: Diagnosis not present

## 2016-03-07 DIAGNOSIS — R0602 Shortness of breath: Secondary | ICD-10-CM | POA: Diagnosis not present

## 2016-03-07 DIAGNOSIS — Z9981 Dependence on supplemental oxygen: Secondary | ICD-10-CM | POA: Diagnosis not present

## 2016-03-07 DIAGNOSIS — Z79899 Other long term (current) drug therapy: Secondary | ICD-10-CM

## 2016-03-07 DIAGNOSIS — E860 Dehydration: Secondary | ICD-10-CM | POA: Diagnosis present

## 2016-03-07 DIAGNOSIS — I10 Essential (primary) hypertension: Secondary | ICD-10-CM | POA: Diagnosis present

## 2016-03-07 DIAGNOSIS — Z8249 Family history of ischemic heart disease and other diseases of the circulatory system: Secondary | ICD-10-CM

## 2016-03-07 DIAGNOSIS — R34 Anuria and oliguria: Secondary | ICD-10-CM | POA: Diagnosis present

## 2016-03-07 DIAGNOSIS — D638 Anemia in other chronic diseases classified elsewhere: Secondary | ICD-10-CM | POA: Diagnosis present

## 2016-03-07 DIAGNOSIS — Z955 Presence of coronary angioplasty implant and graft: Secondary | ICD-10-CM

## 2016-03-07 DIAGNOSIS — J45909 Unspecified asthma, uncomplicated: Secondary | ICD-10-CM | POA: Diagnosis present

## 2016-03-07 DIAGNOSIS — N183 Chronic kidney disease, stage 3 (moderate): Secondary | ICD-10-CM | POA: Diagnosis present

## 2016-03-07 DIAGNOSIS — Z7952 Long term (current) use of systemic steroids: Secondary | ICD-10-CM

## 2016-03-07 NOTE — ED Notes (Addendum)
Pt states that since last Tuesday she has had abdominal pain with nausea and swelling in her hands and feet. Hypoxic. Normally uses oxygen when she sleeps. Hx of CHF. Alert and oriented.

## 2016-03-07 NOTE — ED Notes (Signed)
PA at bedside.

## 2016-03-08 ENCOUNTER — Encounter (HOSPITAL_COMMUNITY): Payer: Self-pay | Admitting: Emergency Medicine

## 2016-03-08 ENCOUNTER — Inpatient Hospital Stay (HOSPITAL_COMMUNITY): Payer: Medicare Other

## 2016-03-08 ENCOUNTER — Emergency Department (HOSPITAL_COMMUNITY): Payer: Medicare Other

## 2016-03-08 DIAGNOSIS — I428 Other cardiomyopathies: Secondary | ICD-10-CM | POA: Diagnosis present

## 2016-03-08 DIAGNOSIS — E860 Dehydration: Secondary | ICD-10-CM | POA: Diagnosis present

## 2016-03-08 DIAGNOSIS — D638 Anemia in other chronic diseases classified elsewhere: Secondary | ICD-10-CM | POA: Diagnosis present

## 2016-03-08 DIAGNOSIS — Z79899 Other long term (current) drug therapy: Secondary | ICD-10-CM | POA: Diagnosis not present

## 2016-03-08 DIAGNOSIS — Z6841 Body Mass Index (BMI) 40.0 and over, adult: Secondary | ICD-10-CM | POA: Diagnosis not present

## 2016-03-08 DIAGNOSIS — Z9981 Dependence on supplemental oxygen: Secondary | ICD-10-CM | POA: Diagnosis not present

## 2016-03-08 DIAGNOSIS — R0602 Shortness of breath: Secondary | ICD-10-CM | POA: Diagnosis not present

## 2016-03-08 DIAGNOSIS — M170 Bilateral primary osteoarthritis of knee: Secondary | ICD-10-CM | POA: Diagnosis present

## 2016-03-08 DIAGNOSIS — I5022 Chronic systolic (congestive) heart failure: Secondary | ICD-10-CM | POA: Diagnosis present

## 2016-03-08 DIAGNOSIS — N183 Chronic kidney disease, stage 3 (moderate): Secondary | ICD-10-CM | POA: Diagnosis present

## 2016-03-08 DIAGNOSIS — J449 Chronic obstructive pulmonary disease, unspecified: Secondary | ICD-10-CM | POA: Diagnosis present

## 2016-03-08 DIAGNOSIS — Z800 Family history of malignant neoplasm of digestive organs: Secondary | ICD-10-CM | POA: Diagnosis not present

## 2016-03-08 DIAGNOSIS — M109 Gout, unspecified: Secondary | ICD-10-CM | POA: Diagnosis present

## 2016-03-08 DIAGNOSIS — N179 Acute kidney failure, unspecified: Principal | ICD-10-CM | POA: Diagnosis present

## 2016-03-08 DIAGNOSIS — R0902 Hypoxemia: Secondary | ICD-10-CM | POA: Diagnosis present

## 2016-03-08 DIAGNOSIS — R109 Unspecified abdominal pain: Secondary | ICD-10-CM | POA: Diagnosis not present

## 2016-03-08 DIAGNOSIS — I2510 Atherosclerotic heart disease of native coronary artery without angina pectoris: Secondary | ICD-10-CM | POA: Diagnosis present

## 2016-03-08 DIAGNOSIS — I130 Hypertensive heart and chronic kidney disease with heart failure and stage 1 through stage 4 chronic kidney disease, or unspecified chronic kidney disease: Secondary | ICD-10-CM | POA: Diagnosis present

## 2016-03-08 DIAGNOSIS — Z7952 Long term (current) use of systemic steroids: Secondary | ICD-10-CM | POA: Diagnosis not present

## 2016-03-08 DIAGNOSIS — N189 Chronic kidney disease, unspecified: Secondary | ICD-10-CM | POA: Diagnosis not present

## 2016-03-08 DIAGNOSIS — Z880 Allergy status to penicillin: Secondary | ICD-10-CM | POA: Diagnosis not present

## 2016-03-08 DIAGNOSIS — I429 Cardiomyopathy, unspecified: Secondary | ICD-10-CM | POA: Diagnosis present

## 2016-03-08 DIAGNOSIS — E662 Morbid (severe) obesity with alveolar hypoventilation: Secondary | ICD-10-CM | POA: Diagnosis present

## 2016-03-08 DIAGNOSIS — E785 Hyperlipidemia, unspecified: Secondary | ICD-10-CM | POA: Diagnosis present

## 2016-03-08 DIAGNOSIS — Z8249 Family history of ischemic heart disease and other diseases of the circulatory system: Secondary | ICD-10-CM | POA: Diagnosis not present

## 2016-03-08 DIAGNOSIS — I480 Paroxysmal atrial fibrillation: Secondary | ICD-10-CM | POA: Diagnosis present

## 2016-03-08 DIAGNOSIS — E7800 Pure hypercholesterolemia, unspecified: Secondary | ICD-10-CM | POA: Diagnosis present

## 2016-03-08 DIAGNOSIS — Z87891 Personal history of nicotine dependence: Secondary | ICD-10-CM | POA: Diagnosis not present

## 2016-03-08 DIAGNOSIS — Z955 Presence of coronary angioplasty implant and graft: Secondary | ICD-10-CM | POA: Diagnosis not present

## 2016-03-08 DIAGNOSIS — R34 Anuria and oliguria: Secondary | ICD-10-CM | POA: Diagnosis present

## 2016-03-08 DIAGNOSIS — J45909 Unspecified asthma, uncomplicated: Secondary | ICD-10-CM | POA: Diagnosis present

## 2016-03-08 LAB — COMPREHENSIVE METABOLIC PANEL
ALT: 25 U/L (ref 14–54)
AST: 27 U/L (ref 15–41)
Albumin: 4 g/dL (ref 3.5–5.0)
Alkaline Phosphatase: 47 U/L (ref 38–126)
Anion gap: 14 (ref 5–15)
BUN: 86 mg/dL — AB (ref 6–20)
CHLORIDE: 88 mmol/L — AB (ref 101–111)
CO2: 35 mmol/L — AB (ref 22–32)
CREATININE: 3.79 mg/dL — AB (ref 0.44–1.00)
Calcium: 8.8 mg/dL — ABNORMAL LOW (ref 8.9–10.3)
GFR calc Af Amer: 14 mL/min — ABNORMAL LOW (ref >60)
GFR calc non Af Amer: 12 mL/min — ABNORMAL LOW (ref >60)
GLUCOSE: 152 mg/dL — AB (ref 65–99)
Potassium: 2.5 mmol/L — CL (ref 3.5–5.1)
SODIUM: 137 mmol/L (ref 135–145)
Total Bilirubin: 1.4 mg/dL — ABNORMAL HIGH (ref 0.3–1.2)
Total Protein: 7.1 g/dL (ref 6.5–8.1)

## 2016-03-08 LAB — CBC WITH DIFFERENTIAL/PLATELET
Basophils Absolute: 0 10*3/uL (ref 0.0–0.1)
Basophils Relative: 0 %
EOS PCT: 1 %
Eosinophils Absolute: 0.1 10*3/uL (ref 0.0–0.7)
HCT: 25.9 % — ABNORMAL LOW (ref 36.0–46.0)
Hemoglobin: 8.5 g/dL — ABNORMAL LOW (ref 12.0–15.0)
LYMPHS ABS: 1.1 10*3/uL (ref 0.7–4.0)
LYMPHS PCT: 21 %
MCH: 29.3 pg (ref 26.0–34.0)
MCHC: 32.8 g/dL (ref 30.0–36.0)
MCV: 89.3 fL (ref 78.0–100.0)
MONOS PCT: 6 %
Monocytes Absolute: 0.3 10*3/uL (ref 0.1–1.0)
Neutro Abs: 3.9 10*3/uL (ref 1.7–7.7)
Neutrophils Relative %: 72 %
PLATELETS: 192 10*3/uL (ref 150–400)
RBC: 2.9 MIL/uL — AB (ref 3.87–5.11)
RDW: 17.5 % — ABNORMAL HIGH (ref 11.5–15.5)
WBC: 5.4 10*3/uL (ref 4.0–10.5)

## 2016-03-08 LAB — CBC
HCT: 27.9 % — ABNORMAL LOW (ref 36.0–46.0)
Hemoglobin: 9 g/dL — ABNORMAL LOW (ref 12.0–15.0)
MCH: 29.6 pg (ref 26.0–34.0)
MCHC: 32.3 g/dL (ref 30.0–36.0)
MCV: 91.8 fL (ref 78.0–100.0)
PLATELETS: 240 10*3/uL (ref 150–400)
RBC: 3.04 MIL/uL — ABNORMAL LOW (ref 3.87–5.11)
RDW: 17.9 % — AB (ref 11.5–15.5)
WBC: 7 10*3/uL (ref 4.0–10.5)

## 2016-03-08 LAB — HEPATIC FUNCTION PANEL
ALT: 22 U/L (ref 14–54)
AST: 25 U/L (ref 15–41)
Albumin: 4 g/dL (ref 3.5–5.0)
Alkaline Phosphatase: 44 U/L (ref 38–126)
Bilirubin, Direct: 0.2 mg/dL (ref 0.1–0.5)
Indirect Bilirubin: 1.2 mg/dL — ABNORMAL HIGH (ref 0.3–0.9)
Total Bilirubin: 1.4 mg/dL — ABNORMAL HIGH (ref 0.3–1.2)
Total Protein: 6.9 g/dL (ref 6.5–8.1)

## 2016-03-08 LAB — OSMOLALITY, URINE: Osmolality, Ur: 324 mOsm/kg (ref 300–900)

## 2016-03-08 LAB — URINALYSIS, ROUTINE W REFLEX MICROSCOPIC
Bilirubin Urine: NEGATIVE
GLUCOSE, UA: NEGATIVE mg/dL
KETONES UR: NEGATIVE mg/dL
Leukocytes, UA: NEGATIVE
Nitrite: NEGATIVE
PROTEIN: NEGATIVE mg/dL
Specific Gravity, Urine: 1.01 (ref 1.005–1.030)
pH: 5.5 (ref 5.0–8.0)

## 2016-03-08 LAB — URINE MICROSCOPIC-ADD ON

## 2016-03-08 LAB — LIPASE, BLOOD
LIPASE: 31 U/L (ref 11–51)
Lipase: 27 U/L (ref 11–51)

## 2016-03-08 LAB — BASIC METABOLIC PANEL
Anion gap: 11 (ref 5–15)
BUN: 85 mg/dL — AB (ref 6–20)
CHLORIDE: 91 mmol/L — AB (ref 101–111)
CO2: 35 mmol/L — AB (ref 22–32)
Calcium: 8.5 mg/dL — ABNORMAL LOW (ref 8.9–10.3)
Creatinine, Ser: 3.6 mg/dL — ABNORMAL HIGH (ref 0.44–1.00)
GFR calc Af Amer: 15 mL/min — ABNORMAL LOW (ref >60)
GFR calc non Af Amer: 13 mL/min — ABNORMAL LOW (ref >60)
GLUCOSE: 146 mg/dL — AB (ref 65–99)
POTASSIUM: 2.5 mmol/L — AB (ref 3.5–5.1)
SODIUM: 137 mmol/L (ref 135–145)

## 2016-03-08 LAB — SEDIMENTATION RATE: Sed Rate: 31 mm/h — ABNORMAL HIGH (ref 0–22)

## 2016-03-08 LAB — POTASSIUM
Potassium: 3.2 mmol/L — ABNORMAL LOW (ref 3.5–5.1)
Potassium: 3.3 mmol/L — ABNORMAL LOW (ref 3.5–5.1)

## 2016-03-08 LAB — BRAIN NATRIURETIC PEPTIDE: B Natriuretic Peptide: 47.6 pg/mL (ref 0.0–100.0)

## 2016-03-08 LAB — OSMOLALITY: Osmolality: 314 mosm/kg — ABNORMAL HIGH (ref 275–295)

## 2016-03-08 LAB — SODIUM, URINE, RANDOM: Sodium, Ur: 48 mmol/L

## 2016-03-08 LAB — CREATININE, URINE, RANDOM: Creatinine, Urine: 96.57 mg/dL

## 2016-03-08 LAB — URIC ACID: Uric Acid, Serum: 13.9 mg/dL — ABNORMAL HIGH (ref 2.3–6.6)

## 2016-03-08 LAB — I-STAT TROPONIN, ED: Troponin i, poc: 0.07 ng/mL (ref 0.00–0.08)

## 2016-03-08 MED ORDER — COLCHICINE 0.6 MG PO TABS: 0.6000 mg | ORAL_TABLET | Freq: Two times a day (BID) | ORAL | Status: DC

## 2016-03-08 MED ORDER — COLCHICINE 0.6 MG PO TABS
0.6000 mg | ORAL_TABLET | Freq: Two times a day (BID) | ORAL | Status: AC
  Administered 2016-03-08 (×2): 0.6 mg via ORAL
  Filled 2016-03-08: qty 1

## 2016-03-08 MED ORDER — MAGNESIUM SULFATE IN D5W 1-5 GM/100ML-% IV SOLN
1.0000 g | Freq: Once | INTRAVENOUS | Status: AC
  Administered 2016-03-08: 1 g via INTRAVENOUS
  Filled 2016-03-08: qty 100

## 2016-03-08 MED ORDER — OMEGA-3-ACID ETHYL ESTERS 1 G PO CAPS
1.0000 g | ORAL_CAPSULE | Freq: Two times a day (BID) | ORAL | Status: DC
Start: 2016-03-08 — End: 2016-03-10
  Administered 2016-03-08 – 2016-03-10 (×5): 1 g via ORAL
  Filled 2016-03-08 (×5): qty 1

## 2016-03-08 MED ORDER — METHYLPREDNISOLONE SODIUM SUCC 125 MG IJ SOLR
80.0000 mg | Freq: Every day | INTRAMUSCULAR | Status: DC
  Administered 2016-03-08 – 2016-03-10 (×3): 80 mg via INTRAVENOUS
  Filled 2016-03-08 (×3): qty 2

## 2016-03-08 MED ORDER — HEPARIN SODIUM (PORCINE) 5000 UNIT/ML IJ SOLN
5000.0000 [IU] | Freq: Three times a day (TID) | INTRAMUSCULAR | Status: DC
  Administered 2016-03-08 – 2016-03-10 (×7): 5000 [IU] via SUBCUTANEOUS
  Filled 2016-03-08 (×7): qty 1

## 2016-03-08 MED ORDER — OXYCODONE HCL 5 MG PO TABS
5.0000 mg | ORAL_TABLET | ORAL | Status: DC | PRN
  Administered 2016-03-08 – 2016-03-09 (×5): 5 mg via ORAL
  Filled 2016-03-08 (×5): qty 1

## 2016-03-08 MED ORDER — SODIUM CHLORIDE 0.9 % IV SOLN
INTRAVENOUS | Status: DC
  Administered 2016-03-08: 23:00:00 via INTRAVENOUS

## 2016-03-08 MED ORDER — POTASSIUM CHLORIDE CRYS ER 20 MEQ PO TBCR
40.0000 meq | EXTENDED_RELEASE_TABLET | Freq: Once | ORAL | Status: AC
  Administered 2016-03-08: 40 meq via ORAL
  Filled 2016-03-08: qty 2

## 2016-03-08 MED ORDER — COLCHICINE 0.6 MG PO TABS
0.6000 mg | ORAL_TABLET | Freq: Every day | ORAL | Status: DC
  Administered 2016-03-09: 0.6 mg via ORAL
  Filled 2016-03-08: qty 1

## 2016-03-08 MED ORDER — DILTIAZEM HCL ER 180 MG PO CP24
180.0000 mg | ORAL_CAPSULE | Freq: Every day | ORAL | Status: DC
  Administered 2016-03-08 – 2016-03-10 (×3): 180 mg via ORAL
  Filled 2016-03-08 (×6): qty 1

## 2016-03-08 MED ORDER — POTASSIUM CHLORIDE 10 MEQ/100ML IV SOLN
10.0000 meq | Freq: Once | INTRAVENOUS | Status: AC
  Administered 2016-03-08: 10 meq via INTRAVENOUS
  Filled 2016-03-08: qty 100

## 2016-03-08 MED ORDER — ONDANSETRON HCL 4 MG PO TABS
4.0000 mg | ORAL_TABLET | Freq: Four times a day (QID) | ORAL | Status: DC | PRN
Start: 2016-03-08 — End: 2016-03-08

## 2016-03-08 MED ORDER — OXYCODONE HCL 5 MG PO TABS
5.0000 mg | ORAL_TABLET | ORAL | Status: DC | PRN
  Administered 2016-03-08: 5 mg via ORAL
  Filled 2016-03-08: qty 1

## 2016-03-08 MED ORDER — ADULT MULTIVITAMIN W/MINERALS CH
1.0000 | ORAL_TABLET | Freq: Every day | ORAL | Status: DC
  Administered 2016-03-08 – 2016-03-10 (×3): 1 via ORAL
  Filled 2016-03-08 (×3): qty 1

## 2016-03-08 MED ORDER — ALBUTEROL SULFATE (2.5 MG/3ML) 0.083% IN NEBU: 2.5000 mg | INHALATION_SOLUTION | Freq: Four times a day (QID) | RESPIRATORY_TRACT | Status: DC | PRN

## 2016-03-08 MED ORDER — ONDANSETRON HCL 4 MG/2ML IJ SOLN
4.0000 mg | Freq: Four times a day (QID) | INTRAMUSCULAR | Status: DC | PRN
  Administered 2016-03-09: 4 mg via INTRAVENOUS
  Filled 2016-03-08: qty 2

## 2016-03-08 MED ORDER — ALLOPURINOL 300 MG PO TABS
300.0000 mg | ORAL_TABLET | Freq: Every day | ORAL | Status: DC
  Administered 2016-03-08 – 2016-03-10 (×3): 300 mg via ORAL
  Filled 2016-03-08 (×3): qty 1

## 2016-03-08 MED ORDER — SODIUM CHLORIDE 0.9% FLUSH
3.0000 mL | Freq: Two times a day (BID) | INTRAVENOUS | Status: DC
  Administered 2016-03-10: 3 mL via INTRAVENOUS

## 2016-03-08 MED ORDER — CARVEDILOL 25 MG PO TABS
50.0000 mg | ORAL_TABLET | Freq: Two times a day (BID) | ORAL | Status: DC
Start: 2016-03-08 — End: 2016-03-10
  Administered 2016-03-08 – 2016-03-10 (×5): 50 mg via ORAL
  Filled 2016-03-08 (×6): qty 2

## 2016-03-08 MED ORDER — HYDRALAZINE HCL 20 MG/ML IJ SOLN: 10.0000 mg | INTRAMUSCULAR | Status: DC | PRN

## 2016-03-08 MED ORDER — POTASSIUM CHLORIDE 10 MEQ/100ML IV SOLN
10.0000 meq | Freq: Once | INTRAVENOUS | Status: DC
  Filled 2016-03-08: qty 100

## 2016-03-08 MED ORDER — ALBUTEROL SULFATE HFA 108 (90 BASE) MCG/ACT IN AERS: 2.0000 | INHALATION_SPRAY | Freq: Four times a day (QID) | RESPIRATORY_TRACT | Status: DC | PRN

## 2016-03-08 MED ORDER — ACETAMINOPHEN 650 MG RE SUPP: 650.0000 mg | Freq: Four times a day (QID) | RECTAL | Status: DC | PRN

## 2016-03-08 MED ORDER — ACETAMINOPHEN 325 MG PO TABS: 650.0000 mg | ORAL_TABLET | Freq: Four times a day (QID) | ORAL | Status: DC | PRN

## 2016-03-08 MED ORDER — POTASSIUM CHLORIDE 10 MEQ/100ML IV SOLN
10.0000 meq | INTRAVENOUS | Status: AC
  Administered 2016-03-08 (×4): 10 meq via INTRAVENOUS
  Filled 2016-03-08 (×3): qty 100

## 2016-03-08 MED ORDER — PROPAFENONE HCL 150 MG PO TABS
150.0000 mg | ORAL_TABLET | Freq: Three times a day (TID) | ORAL | Status: DC
Start: 2016-03-08 — End: 2016-03-10
  Administered 2016-03-08 – 2016-03-10 (×7): 150 mg via ORAL
  Filled 2016-03-08 (×11): qty 1

## 2016-03-08 NOTE — ED Notes (Signed)
Writer made pt aware that urine is needed, BSC at bedside

## 2016-03-08 NOTE — ED Notes (Signed)
PT reports to swelling in bilateral hands and feet that started last week. Pt reports that she had eaten fried seafood last week then symptoms began thereafter. Pt alert and oriented x 4 at this time and oxygen saturation 100% on 2L via [place]. Pt reports that she is on oxygen at home as needed.

## 2016-03-08 NOTE — Care Management Note (Signed)
Case Management Note  Patient Details  Name: Paige Marshall MRN: [id number] Date of Birth: [birth year]  Subjective/Objective:59 y/o f admitted w/ARF. From home.                    Action/Plan:d/c plan home.   Expected Discharge Date:                  Expected Discharge Plan:  Home/Self Care  In-House Referral:     Discharge planning Services  CM Consult  Post Acute Care Choice:    Choice offered to:     DME Arranged:    DME Agency:     HH Arranged:    HH Agency:     Status of Service:  In process, will continue to follow  Medicare Important Message Given:    Date Medicare IM Given:    Medicare IM give by:    Date Additional Medicare IM Given:    Additional Medicare Important Message give by:     If discussed at [place] of Stay Meetings, dates discussed:    Additional Comments:  Dessa Phi, RN 03/08/2016, 1:28 PM

## 2016-03-08 NOTE — Plan of Care (Signed)
Problem: Activity: Goal: Capacity to carry out activities will improve Outcome: Completed/Met Date Met:  03/08/16 Pt has tolerated activity well. Gait is steady with use of walker

## 2016-03-08 NOTE — ED Provider Notes (Signed)
CSN: [id number]     Arrival date & time 03/07/16  2310 History   First MD Initiated Contact with Patient 03/07/16 2335     Chief Complaint  Patient presents with  . Abdominal Pain  . Joint Swelling   HPI  Paige Marshall is a 60 year old female with PMHx of HTN, CHF, CKD PAF, OSA and nonischemic cardiomyopathy presenting with multiple complaints. Patient reports generally not feeling well over the past week. She complains of increased fatigue and extremity swelling. She states that her hands, ankles and calves are swelling even though she continues her daily lasix dose. She does note that she ate fried food prior to extremity swelling. She states that her extremities are aching and this is worsened with ambulation. She also notes increased shortness of breath. Shortness of breath is worsened on exertion and with lying flat. Denies associated chest pain or palpitations. She also complains of generalized abdominal pain. She states that her abdomen feels sore. She endorses associated nausea without vomiting. No constipation or diarrhea. She states that the SOB, swelling and abdominal pain are typical of her CHF exacerbations. She states that she typically sleeps with 2 L oxygen at night and does not use it during the day. She denies any new medications. No other complaints today.   Past Medical History  Diagnosis Date  . CHF (congestive heart failure) ([place])   . Hypertension   . Paroxysmal atrial fibrillation (H[facility])   . High cholesterol   . Former smoker   . Dyspnea     progressive  . Edema     Lower extremity  . Coronary artery disease   . Asthma   . Pneumonia X 2  . OSA (obstructive sleep apnea)     "never sent CPAP out" (01/14/2015)  . Arthritis     "both knees" (01/14/2015)  . Degenerative joint disease of both lower legs   . Gout     hx in "both knees" (01/14/2015)  . Nonischemic cardiomyopathy [facility])    Past Surgical History  Procedure Laterality Date  . Breath tek h pylori N/A 12/31/2013     Procedure: BREATH TEK H PYLORI;  Surgeon: Gayland Curry, MD;  Location: [facility];  Service: General;  Laterality: N/A;  . Doppler echocardiography      2 D   EF of 45%  . Angiogram/lv (congenital)  2007  . Sleep study  2011  . Stress myocardial dipyridamole perfusion    . Venous duplex ultrasound  2013  . Coronary angioplasty with stent placement  11/04/2005    "1"  . Tubal ligation  1982  . Eye surgery    . Corneal transplant Right ~ 2010  . Left heart catheterization with coronary angiogram N/A 01/20/2015    Procedure: LEFT HEART CATHETERIZATION WITH CORONARY ANGIOGRAM;  Surgeon: Lorretta Harp, MD;  Location: [facility] CATH LAB;  Service: Cardiovascular;  Laterality: N/A;   Family History  Problem Relation Age of Onset  . Heart disease Mother   . Cancer Father     colon  . Hypertension Other    Social History  Substance Use Topics  . Smoking status: Former Smoker -- 0.33 packs/day for 35 years    Types: Cigarettes    Quit date: 10/11/2008  . Smokeless tobacco: Never Used  . Alcohol Use: 4.8 oz/week    8 Cans of beer per week     Comment: 01/14/2015 "~ 4, 24oz cans beer/wk"   OB History    No data  available     Review of Systems  All other systems reviewed and are negative.     Allergies  Penicillins  Home Medications   Prior to Admission medications   Medication Sig Start Date End Date Taking? Authorizing Provider  albuterol (PROVENTIL HFA;VENTOLIN HFA) 108 (90 Base) MCG/ACT inhaler Inhale 2 puffs into the lungs every 6 (six) hours as needed for wheezing or shortness of breath. 10/24/15  Yes Deneise Lever, MD  allopurinol (ZYLOPRIM) 300 MG tablet Take 1 tablet (300 mg total) by mouth daily. 01/21/15  Yes Nishant Dhungel, MD  carvedilol (COREG) 25 MG tablet TAKE 2 TABLETS (50 MG TOTAL) BY MOUTH 2 (TWO) TIMES DAILY. 08/20/15  Yes Lorretta Harp, MD  diclofenac (VOLTAREN) 75 MG EC tablet Take 75 mg by mouth 2 (two) times daily with a meal. 03/22/15  Yes Historical  Provider, MD  diltiazem (DILACOR XR) 180 MG 24 hr capsule TAKE 1 CAPSULE (180 MG TOTAL) BY MOUTH DAILY. 02/02/16  Yes Lorretta Harp, MD  furosemide (LASIX) 80 MG tablet TAKE 1 TABLET (80 MG TOTAL) BY MOUTH 2 (TWO) TIMES DAILY. 02/09/16  Yes Lorretta Harp, MD  metolazone (ZAROXOLYN) 2.5 MG tablet TAKE 1 TABLET (2.5 MG TOTAL) BY MOUTH 2 (TWO) TIMES DAILY. HALF HOUR BEFORE LASIX DOSE 06/24/15  Yes Lorretta Harp, MD  Multiple Vitamin (MULTIVITAMIN WITH MINERALS) TABS tablet Take 1 tablet by mouth daily.   Yes Historical Provider, MD  Omega-3 Fatty Acids (FISH OIL CONCENTRATE PO) Take 1 capsule by mouth daily.    Yes Historical Provider, MD  propafenone (RYTHMOL) 150 MG tablet TAKE 1 TABLET (150 MG TOTAL) BY MOUTH EVERY 8 (EIGHT) HOURS. 01/05/16  Yes Pixie Casino, MD  oxyCODONE (OXY IR/ROXICODONE) 5 MG immediate release tablet Take 1 tablet (5 mg total) by mouth every 6 (six) hours as needed for moderate pain. 10/12/15   Christina P Rama, MD  predniSONE (DELTASONE) 50 MG tablet Take 1 tablet (50 mg total) by mouth daily. 11/30/15   Konrad Felix, PA   BP 121/61 mmHg  Pulse 66  Temp(Src) 98 F (36.7 C) (Oral)  Resp 18  Ht 5\' 7"  (1.702 m)  Wt 157.171 kg  BMI 54.26 kg/m2  SpO2 93%  LMP 09/07/2011 Physical Exam  Constitutional: She appears well-developed and well-nourished. No distress.  Sitting straight up in bed, Paige Marshall reports this improves her breathing.   HENT:  Head: Normocephalic and atraumatic.  Mouth/Throat: Oropharynx is clear and moist.  Eyes: Conjunctivae are normal. Right eye exhibits no discharge. Left eye exhibits no discharge. No scleral icterus.  Neck: Normal range of motion. Neck supple. No JVD present.  Cardiovascular: Normal rate, regular rhythm and normal heart sounds.   Pulmonary/Chest: Effort normal and breath sounds normal. No respiratory distress. She has no wheezes. She has no rales.  Lungs CTAB. No rales  Abdominal: Soft. Bowel sounds are normal. She exhibits no  distension. There is no tenderness. There is no rebound and no guarding.  Musculoskeletal: Normal range of motion.  Hands with non-pitting edema bilaterally. FROM of BUE intact. No tenderness to palpation. 1+ bilaterally pitting edema of the BLE. FROM of BLE intact. No tenderness to palpation. No unilateral calf swelling.   Neurological: She is alert.  Skin: Skin is warm and dry. She is not diaphoretic. No erythema.  Psychiatric: She has a normal mood and affect. Her behavior is normal.  Nursing note and vitals reviewed.   ED Course  Procedures (including critical care  time) Labs Review Labs Reviewed  COMPREHENSIVE METABOLIC PANEL - Abnormal; Notable for the following:    Potassium 2.5 (*)    Chloride 88 (*)    CO2 35 (*)    Glucose, Bld 152 (*)    BUN 86 (*)    Creatinine, Ser 3.79 (*)    Calcium 8.8 (*)    Total Bilirubin 1.4 (*)    GFR calc non Af Amer 12 (*)    GFR calc Af Amer 14 (*)    All other components within normal limits  CBC - Abnormal; Notable for the following:    RBC 3.04 (*)    Hemoglobin 9.0 (*)    HCT 27.9 (*)    RDW 17.9 (*)    All other components within normal limits  URINALYSIS, ROUTINE W REFLEX MICROSCOPIC (NOT AT [facility]) - Abnormal; Notable for the following:    APPearance CLOUDY (*)    Hgb urine dipstick SMALL (*)    All other components within normal limits  URIC ACID - Abnormal; Notable for the following:    Uric Acid, Serum 13.9 (*)    All other components within normal limits  SEDIMENTATION RATE - Abnormal; Notable for the following:    Sed Rate 31 (*)    All other components within normal limits  HEPATIC FUNCTION PANEL - Abnormal; Notable for the following:    Total Bilirubin 1.4 (*)    Indirect Bilirubin 1.2 (*)    All other components within normal limits  BASIC METABOLIC PANEL - Abnormal; Notable for the following:    Potassium 2.5 (*)    Chloride 91 (*)    CO2 35 (*)    Glucose, Bld 146 (*)    BUN 85 (*)    Creatinine, Ser 3.60 (*)     Calcium 8.5 (*)    GFR calc non Af Amer 13 (*)    GFR calc Af Amer 15 (*)    All other components within normal limits  CBC WITH DIFFERENTIAL/PLATELET - Abnormal; Notable for the following:    RBC 2.90 (*)    Hemoglobin 8.5 (*)    HCT 25.9 (*)    RDW 17.5 (*)    All other components within normal limits  OSMOLALITY - Abnormal; Notable for the following:    Osmolality 314 (*)    All other components within normal limits  URINE MICROSCOPIC-ADD ON - Abnormal; Notable for the following:    Squamous Epithelial / LPF 0-5 (*)    Bacteria, UA MANY (*)    All other components within normal limits  POTASSIUM - Abnormal; Notable for the following:    Potassium 3.2 (*)    All other components within normal limits  LIPASE, BLOOD  BRAIN NATRIURETIC PEPTIDE  SODIUM, URINE, RANDOM  CREATININE, URINE, RANDOM  LIPASE, BLOOD  OSMOLALITY, URINE  CBC  ANTINUCLEAR ANTIBODIES, IFA  I-STAT TROPOININ, ED    Imaging Review Ct Abdomen Pelvis Wo Contrast  03/08/2016  CLINICAL DATA:  Acute renal failure EXAM: CT ABDOMEN AND PELVIS WITHOUT CONTRAST TECHNIQUE: Multidetector CT imaging of the abdomen and pelvis was performed following the standard protocol without IV contrast. COMPARISON:  None. FINDINGS: Lower chest:  Mild cardiomegaly.  Clear lung bases. Hepatobiliary: Normal liver.  Normal gallbladder. Pancreas: Normal. Spleen: Normal. Adrenals/Urinary Tract: Normal adrenal glands. Normal kidneys. No urolithiasis or obstructive uropathy. Normal decompressed bladder. Stomach/Bowel: No bowel wall thickening or dilatation. No pneumatosis, pneumoperitoneum or portal venous gas. No abdominal or pelvic free fluid. Vascular/Lymphatic: Normal caliber  abdominal aorta with mild atherosclerosis. No lymphadenopathy. Reproductive: Normal uterus.  No adnexal mass. Other: No fluid collection or hematoma. Musculoskeletal: Degenerative facet arthropathy throughout the lumbar spine. No aggressive osseous lesion. Mild  degenerative changes of the right sacroiliac joint. IMPRESSION: 1. No acute abdominal or pelvic pathology. 2. Mild stable cardiomegaly. Electronically Signed   By: Kathreen Devoid   On: 03/08/2016 09:36   Dg Chest 2 View  03/08/2016  CLINICAL DATA:  Acute onset of shortness of breath, and bilateral hand and foot swelling. Initial encounter. EXAM: CHEST  2 VIEW COMPARISON:  Chest radiograph performed 10/10/2015 FINDINGS: The lungs are well-aerated. Pulmonary vascularity is at the upper limits of normal. There is no evidence of focal opacification, pleural effusion or pneumothorax. The heart is enlarged.  No acute osseous abnormalities are seen. IMPRESSION: Cardiomegaly.  Lungs remain grossly clear. Electronically Signed   By: Garald Balding M.D.   On: 03/08/2016 00:52   I have personally reviewed and evaluated these images and lab results as part of my medical decision-making.   EKG Interpretation None      MDM   Final diagnoses:  AKI (acute kidney injury) ([place])  Hypokalemia  Hypoxia   60 year old female presenting with SOB, extremity swelling, abdominal pain x 1 week. Hypoxic to 83% on RA which corrects to 100% on 2L [place]. Paige Marshall has history of O2 requirement at night only. Paige Marshall is nontoxic appearing. Swelling noted in all extremities which are neurovascularly intact, non-tender and with FROM intact. Heart RRR. Lungs CTAB. Abdomen is soft, non-tender without peritoneal signs. Chronic anemia noted on CBC. No leukocytosis. Potassium critically low to 2.5 which was repleted by IV and PO in ED. Creatine elevated from baseline. BNP WNL. Troponin negative with non-ischemic ECG. Paige Marshall presenting with acute on chronic kidney failure. Paige Marshall admits to recent decrease in urine output. Consulted hospitalist for admission to further evaluate acute kidney failure. Dr. Hal Hope accepting.    Lahoma Crocker Briton Sellman, PA-C [id number]  Delora Fuel, MD [id number]

## 2016-03-08 NOTE — Progress Notes (Signed)
PROGRESS NOTE                                                                                                                                                                                                             Patient Demographics:    Paige Marshall, is a 60 y.o. female, DOB - 1956/02/08, [id number]  Admit date - 03/07/2016   Admitting Physician Rise Patience, MD  Outpatient Primary MD for the patient is EDWARDS, Milford Cage, NP  LOS - 0  Chief Complaint  Patient presents with  . Abdominal Pain  . Joint Swelling       Brief Narrative     Paige Marshall is a 59 y.o. female with medical history significant of chronic kidney disease stage III, chronic systolic heart failure last EF measured was 40-45% in 2016, paroxysmal atrial fibrillation, COPD, obesity hypoventilation does not wear CPAP presents to the ER because of increasing swelling in both hands and abdominal discomfort. Patient states she has been on increasing swelling in both hands over the last 1 week and her primary care did give her a course of prednisone. Despite the patient's willingness to persistent and patient also has benign increasing abdominal discomfort with some nausea denies any vomiting or diarrhea. In the ER patient's lab work show creatinine elevated at 3.79 and this is increased from base of 1.6 few months ago. Patient has been chronically using diclofenac for arthritis.   Patient is also on Lasix and metolazone for systolic heart failure. Patient on exam does not have any abdominal tenderness and JVD is not elevated. Patient states she has not gained any weight. Both her hands look mildly puffy but no signs of any infection. Patient also has been having decreasing urine output over the last few days.    Subjective:    Paige Marshall today has, No headache, No chest pain, No abdominal pain - No Nausea, No new weakness  tingling or numbness, No Cough - SOB. Has some diffuse pains and aches & edema in her hands.   Assessment  & Plan :    1. Acute renal failure on chronic kidney disease stage III. Baseline creatinine of 1.6, she clinically appears dehydrated, was on NSAIDs, Lasix along with Zaroxolyn. We'll hold all offending medications, serum osmolality, urine sodium and osmolarity have been ordered, hydrate for now.  Monitor BMP. Noncontrast CT abdomen and pelvis rules out any obstruction.  2. Diffuse aches and pains with swelling in both hands. Uric acid of 14, has diffuse gout, will place on IV steroids along with colchicine, once improved we will add allopurinol hopefully in the next 3-4 weeks, will also check ANA to rule out rheumatological autoimmune process. Good passive range of motion in multiple joints. No signs of isolated joint effusion.  3. Generalized abdominal discomfort. CT abdomen and pelvis unremarkable. Supportive care. No nausea, no emesis, no blood or mucus in stool. Normal exam benign.  4. Systolic heart failure EF 45%. Currently dehydrated. For now continue Coreg. Avoid ACE/ARB due to acute renal failure.  5. Paroxysmal atrial fibrillation. [place] vasc 2 score of 3. She is on Rythmol, Cardizem and Coreg will be continued. Not on anticoagulation, will defer this to primary cardiologist and PCP.  6. Severe hypokalemia. Likely due to excessive diuresis, replace and monitor closely. Monitor mag levels.  7. History of COPD and obesity related hypoventilation syndrome. At baseline, no wheezing no acute issues. Supportive care. Of note she is on home oxygen which will be continued.  8. Anemia of chronic disease. Stable.    Code Status :  Full  Family Communication  :  None present  Disposition Plan  : Home 1-2 days  Consults  :  None  Procedures  :    CT abdomen pelvis. Nonacute  DVT Prophylaxis  :   Heparin    Lab Results  Component Value Date   PLT 192 03/08/2016    Inpatient  Medications  Scheduled Meds: . allopurinol  300 mg Oral Daily  . carvedilol  50 mg Oral BID WC  . diltiazem  180 mg Oral Daily  . heparin  5,000 Units Subcutaneous Q8H  . magnesium sulfate 1 - 4 g bolus IVPB  1 g Intravenous Once  . methylPREDNISolone (SOLU-MEDROL) injection  80 mg Intravenous Daily  . multivitamin with minerals  1 tablet Oral Daily  . omega-3 acid ethyl esters  1 g Oral BID  . potassium chloride  10 mEq Intravenous Q1 Hr x 4  . potassium chloride  40 mEq Oral Once  . propafenone  150 mg Oral Q8H  . sodium chloride flush  3 mL Intravenous Q12H   Continuous Infusions: . sodium chloride     PRN Meds:.acetaminophen **OR** [DISCONTINUED] acetaminophen, albuterol, hydrALAZINE, [DISCONTINUED] ondansetron **OR** ondansetron (ZOFRAN) IV, oxyCODONE  Antibiotics  :    Anti-infectives    None         Objective:   Filed Vitals:   03/08/16 0130 03/08/16 0200 03/08/16 0300 03/08/16 0356  BP: 153/74 149/88 138/63 175/81  Pulse: 64 64 63 66  Temp:    98 F (36.7 C)  TempSrc:    Oral  Resp: 16 13 18 18   Height:    5\' 7"  (1.702 m)  Weight:    157.171 kg (346 lb 8 oz)  SpO2: 100% 99% 100% 93%    Wt Readings from Last 3 Encounters:  03/08/16 157.171 kg (346 lb 8 oz)  10/24/15 155.039 kg (341 lb 12.8 oz)  10/12/15 158.6 kg (349 lb 10.4 oz)     Intake/Output Summary (Last 24 hours) at 03/08/16 0940 Last data filed at 03/08/16 0840  Gross per 24 hour  Intake      0 ml  Output    400 ml  Net   -400 ml     Physical Exam  Awake Alert, Oriented X  3, No new F.N deficits, Normal affect [place].AT,PERRAL Supple Neck,No JVD, No cervical lymphadenopathy appriciated.  Symmetrical Chest wall movement, Good air movement bilaterally, CTAB RRR,No Gallops,Rubs or new Murmurs, No Parasternal Heave +ve B.Sounds, Abd Soft, No tenderness, No organomegaly appriciated, No rebound - guarding or rigidity. No Cyanosis, Clubbing or edema, No new Rash or bruise , Mild swelling in both  hands, no pitting edema, no particular joint tenderness or effusion.    Data Review:    CBC  Recent Labs Lab 03/08/16 0029 03/08/16 0621  WBC 7.0 5.4  HGB 9.0* 8.5*  HCT 27.9* 25.9*  PLT 240 192  MCV 91.8 89.3  MCH 29.6 29.3  MCHC 32.3 32.8  RDW 17.9* 17.5*  LYMPHSABS  --  1.1  MONOABS  --  0.3  EOSABS  --  0.1  BASOSABS  --  0.0    Chemistries   Recent Labs Lab 03/08/16 0029 03/08/16 0621  NA 137 137  K 2.5* 2.5*  CL 88* 91*  CO2 35* 35*  GLUCOSE 152* 146*  BUN 86* 85*  CREATININE 3.79* 3.60*  CALCIUM 8.8* 8.5*  AST 27 25  ALT 25 22  ALKPHOS 47 44  BILITOT 1.4* 1.4*   ------------------------------------------------------------------------------------------------------------------ No results for input(s): CHOL, HDL, LDLCALC, TRIG, CHOLHDL, LDLDIRECT in the last 72 hours.  Lab Results  Component Value Date   HGBA1C  10/02/2009    5.6 (NOTE) The ADA recommends the following therapeutic goal for glycemic control related to Hgb A1c measurement: Goal of therapy: <6.5 Hgb A1c  Reference: American Diabetes Association: Clinical Practice Recommendations 2010, Diabetes Care, 2010, 33: (Suppl  1).   ------------------------------------------------------------------------------------------------------------------ No results for input(s): TSH, T4TOTAL, T3FREE, THYROIDAB in the last 72 hours.  Invalid input(s): FREET3 ------------------------------------------------------------------------------------------------------------------ No results for input(s): VITAMINB12, FOLATE, FERRITIN, TIBC, IRON, RETICCTPCT in the last 72 hours.  Coagulation profile No results for input(s): INR, PROTIME in the last 168 hours.  No results for input(s): DDIMER in the last 72 hours.  Cardiac Enzymes No results for input(s): CKMB, TROPONINI, MYOGLOBIN in the last 168 hours.  Invalid input(s):  CK ------------------------------------------------------------------------------------------------------------------    Component Value Date/Time   BNP 47.6 03/08/2016 0029    Micro Results No results found for this or any previous visit (from the past 240 hour(s)).  Radiology Reports Ct Abdomen Pelvis Wo Contrast  03/08/2016  CLINICAL DATA:  Acute renal failure EXAM: CT ABDOMEN AND PELVIS WITHOUT CONTRAST TECHNIQUE: Multidetector CT imaging of the abdomen and pelvis was performed following the standard protocol without IV contrast. COMPARISON:  None. FINDINGS: Lower chest:  Mild cardiomegaly.  Clear lung bases. Hepatobiliary: Normal liver.  Normal gallbladder. Pancreas: Normal. Spleen: Normal. Adrenals/Urinary Tract: Normal adrenal glands. Normal kidneys. No urolithiasis or obstructive uropathy. Normal decompressed bladder. Stomach/Bowel: No bowel wall thickening or dilatation. No pneumatosis, pneumoperitoneum or portal venous gas. No abdominal or pelvic free fluid. Vascular/Lymphatic: Normal caliber abdominal aorta with mild atherosclerosis. No lymphadenopathy. Reproductive: Normal uterus.  No adnexal mass. Other: No fluid collection or hematoma. Musculoskeletal: Degenerative facet arthropathy throughout the lumbar spine. No aggressive osseous lesion. Mild degenerative changes of the right sacroiliac joint. IMPRESSION: 1. No acute abdominal or pelvic pathology. 2. Mild stable cardiomegaly. Electronically Signed   By: Kathreen Devoid   On: 03/08/2016 09:36   Dg Chest 2 View  03/08/2016  CLINICAL DATA:  Acute onset of shortness of breath, and bilateral hand and foot swelling. Initial encounter. EXAM: CHEST  2 VIEW COMPARISON:  Chest radiograph performed 10/10/2015 FINDINGS: The  lungs are well-aerated. Pulmonary vascularity is at the upper limits of normal. There is no evidence of focal opacification, pleural effusion or pneumothorax. The heart is enlarged.  No acute osseous abnormalities are seen.  IMPRESSION: Cardiomegaly.  Lungs remain grossly clear. Electronically Signed   By: Garald Balding M.D.   On: 03/08/2016 00:52    Time Spent in minutes  30   SINGH,PRASHANT K M.D on 03/08/2016 at 9:40 AM  Between 7am to 7pm - Pager - [phone]  After 7pm go to [email or web address] - password [email or web address]  [facility] -  Office  [phone]

## 2016-03-08 NOTE — ED Notes (Signed)
Writer made aware again to pt that urine is needed for sample.

## 2016-03-08 NOTE — ED Notes (Signed)
Critical K+ of 2.5.  Notified Stevie, [place] and [employer]	[place], Therapist, sports.

## 2016-03-08 NOTE — H&P (Signed)
History and Physical    Paige Marshall [id number] DOB: 1956-09-21 DOA: 03/07/2016  PCP: Kerin Perna, NP  Patient coming from: Home.  Chief Complaint: Hand pain and abdominal discomfort.  HPI: Paige Marshall is a 60 y.o. female with medical history significant of chronic kidney disease stage III, chronic systolic heart failure last EF measured was 40-45% in 2016, paroxysmal atrial fibrillation, COPD, obesity hypoventilation does not wear CPAP presents to the ER because of increasing swelling in both hands and abdominal discomfort. Patient states she has been on increasing swelling in both hands over the last 1 week and her primary care did give her a course of prednisone. Despite the patient's willingness to persistent and patient also has benign increasing abdominal discomfort with some nausea denies any vomiting or diarrhea. In the ER patient's lab work show creatinine elevated at 3.79 and this is increased from base of 1.6 few months ago. Patient has been chronically using diclofenac for arthritis. Patient is also on Lasix and metolazone for systolic heart failure. Patient on exam does not have any abdominal tenderness and JVD is not elevated. Patient states she has not gained any weight. Both her hands look mildly puffy but no signs of any infection. Patient also has been having decreasing urine output over the last few days.  ED Course: See history of present illness.  Review of Systems: As per HPI otherwise 10 point review of systems negative.    Past Medical History  Diagnosis Date  . CHF (congestive heart failure) ([place])   . Hypertension   . Paroxysmal atrial fibrillation (HCC)   . High cholesterol   . Former smoker   . Dyspnea     progressive  . Edema     Lower extremity  . Coronary artery disease   . Asthma   . Pneumonia X 2  . OSA (obstructive sleep apnea)     "never sent CPAP out" (01/14/2015)  . Arthritis     "both knees" (01/14/2015)  . Degenerative  joint disease of both lower legs   . Gout     hx in "both knees" (01/14/2015)  . Nonischemic cardiomyopathy [facility])     Past Surgical History  Procedure Laterality Date  . Breath tek h pylori N/A 12/31/2013    Procedure: BREATH TEK H PYLORI;  Surgeon: Gayland Curry, MD;  Location: [facility];  Service: General;  Laterality: N/A;  . Doppler echocardiography      2 D   EF of 45%  . Angiogram/lv (congenital)  2007  . Sleep study  2011  . Stress myocardial dipyridamole perfusion    . Venous duplex ultrasound  2013  . Coronary angioplasty with stent placement  11/04/2005    "1"  . Tubal ligation  1982  . Eye surgery    . Corneal transplant Right ~ 2010  . Left heart catheterization with coronary angiogram N/A 01/20/2015    Procedure: LEFT HEART CATHETERIZATION WITH CORONARY ANGIOGRAM;  Surgeon: Lorretta Harp, MD;  Location: [facility] CATH LAB;  Service: Cardiovascular;  Laterality: N/A;     reports that she quit smoking about 7 years ago. Her smoking use included Cigarettes. She has a 11.55 pack-year smoking history. She has never used smokeless tobacco. She reports that she drinks about 4.8 oz of alcohol per week. She reports that she does not use illicit drugs.  Allergies  Allergen Reactions  . Penicillins Hives, Itching, Swelling and Rash    Family History  Problem Relation Age  of Onset  . Heart disease Mother   . Cancer Father     colon  . Hypertension Other     Prior to Admission medications   Medication Sig Start Date End Date Taking? Authorizing Provider  albuterol (PROVENTIL HFA;VENTOLIN HFA) 108 (90 Base) MCG/ACT inhaler Inhale 2 puffs into the lungs every 6 (six) hours as needed for wheezing or shortness of breath. 10/24/15  Yes Deneise Lever, MD  allopurinol (ZYLOPRIM) 300 MG tablet Take 1 tablet (300 mg total) by mouth daily. 01/21/15  Yes Nishant Dhungel, MD  carvedilol (COREG) 25 MG tablet TAKE 2 TABLETS (50 MG TOTAL) BY MOUTH 2 (TWO) TIMES DAILY. 08/20/15  Yes Lorretta Harp, MD  diclofenac (VOLTAREN) 75 MG EC tablet Take 75 mg by mouth 2 (two) times daily with a meal. 03/22/15  Yes Historical Provider, MD  diltiazem (DILACOR XR) 180 MG 24 hr capsule TAKE 1 CAPSULE (180 MG TOTAL) BY MOUTH DAILY. 02/02/16  Yes Lorretta Harp, MD  furosemide (LASIX) 80 MG tablet TAKE 1 TABLET (80 MG TOTAL) BY MOUTH 2 (TWO) TIMES DAILY. 02/09/16  Yes Lorretta Harp, MD  metolazone (ZAROXOLYN) 2.5 MG tablet TAKE 1 TABLET (2.5 MG TOTAL) BY MOUTH 2 (TWO) TIMES DAILY. HALF HOUR BEFORE LASIX DOSE 06/24/15  Yes Lorretta Harp, MD  Multiple Vitamin (MULTIVITAMIN WITH MINERALS) TABS tablet Take 1 tablet by mouth daily.   Yes Historical Provider, MD  Omega-3 Fatty Acids (FISH OIL CONCENTRATE PO) Take 1 capsule by mouth daily.    Yes Historical Provider, MD  propafenone (RYTHMOL) 150 MG tablet TAKE 1 TABLET (150 MG TOTAL) BY MOUTH EVERY 8 (EIGHT) HOURS. 01/05/16  Yes Pixie Casino, MD  oxyCODONE (OXY IR/ROXICODONE) 5 MG immediate release tablet Take 1 tablet (5 mg total) by mouth every 6 (six) hours as needed for moderate pain. 10/12/15   Christina P Rama, MD  predniSONE (DELTASONE) 50 MG tablet Take 1 tablet (50 mg total) by mouth daily. 11/30/15   Konrad Felix, PA    Physical Exam: Filed Vitals:   03/08/16 0130 03/08/16 0200 03/08/16 0300 03/08/16 0356  BP: 153/74 149/88 138/63 175/81  Pulse: 64 64 63 66  Temp:    98 F (36.7 C)  TempSrc:    Oral  Resp: 16 13 18 18   Height:    5\' 7"  (1.702 m)  Weight:    346 lb 8 oz (157.171 kg)  SpO2: 100% 99% 100% 93%      Constitutional: Not in distress. Filed Vitals:   03/08/16 0130 03/08/16 0200 03/08/16 0300 03/08/16 0356  BP: 153/74 149/88 138/63 175/81  Pulse: 64 64 63 66  Temp:    98 F (36.7 C)  TempSrc:    Oral  Resp: 16 13 18 18   Height:    5\' 7"  (1.702 m)  Weight:    346 lb 8 oz (157.171 kg)  SpO2: 100% 99% 100% 93%   Eyes: Anicteric no pallor. ENMT: No discharge from the ears eyes nose and mouth. Neck: No mass felt.  No JVD appreciated. Respiratory: No rhonchi or crepitations. Cardiovascular: S1-S2 heard. Abdomen: Soft nontender bowel sounds present. Musculoskeletal: Both hands looks puffy but patient is able to make fist from both of it. Skin: No rash. Neurologic: Alert awake oriented to time place and person. Moves all extremities. Psychiatric: Appears normal.   Labs on Admission: I have personally reviewed following labs and imaging studies  CBC:  Recent Labs Lab 03/08/16 0029  WBC  7.0  HGB 9.0*  HCT 27.9*  MCV 91.8  PLT [id number]   Basic Metabolic Panel:  Recent Labs Lab 03/08/16 0029  NA 137  K 2.5*  CL 88*  CO2 35*  GLUCOSE 152*  BUN 86*  CREATININE 3.79*  CALCIUM 8.8*   GFR: Estimated Creatinine Clearance: 25.8 mL/min (by C-G formula based on Cr of 3.79). Liver Function Tests:  Recent Labs Lab 03/08/16 0029  AST 27  ALT 25  ALKPHOS 47  BILITOT 1.4*  PROT 7.1  ALBUMIN 4.0    Recent Labs Lab 03/08/16 0029  LIPASE 31   No results for input(s): AMMONIA in the last 168 hours. Coagulation Profile: No results for input(s): INR, PROTIME in the last 168 hours. Cardiac Enzymes: No results for input(s): CKTOTAL, CKMB, CKMBINDEX, TROPONINI in the last 168 hours. BNP (last 3 results) No results for input(s): PROBNP in the last 8760 hours. HbA1C: No results for input(s): HGBA1C in the last 72 hours. CBG: No results for input(s): GLUCAP in the last 168 hours. Lipid Profile: No results for input(s): CHOL, HDL, LDLCALC, TRIG, CHOLHDL, LDLDIRECT in the last 72 hours. Thyroid Function Tests: No results for input(s): TSH, T4TOTAL, FREET4, T3FREE, THYROIDAB in the last 72 hours. Anemia Panel: No results for input(s): VITAMINB12, FOLATE, FERRITIN, TIBC, IRON, RETICCTPCT in the last 72 hours. Urine analysis:    Component Value Date/Time   COLORURINE YELLOW 11/16/2009 0554   APPEARANCEUR CLOUDY* 11/16/2009 0554   LABSPEC 1.019 11/16/2009 0554   PHURINE 5.0 11/16/2009 0554     GLUCOSEU NEGATIVE 11/16/2009 0554   HGBUR NEGATIVE 11/16/2009 0554   BILIRUBINUR SMALL* 11/16/2009 0554   KETONESUR NEGATIVE 11/16/2009 0554   PROTEINUR NEGATIVE 11/16/2009 0554   UROBILINOGEN 1.0 11/16/2009 0554   NITRITE NEGATIVE 11/16/2009 0554   LEUKOCYTESUR NEGATIVE 11/16/2009 0554   Sepsis Labs: @LABRCNTIP (procalcitonin:4,lacticidven:4) )No results found for this or any previous visit (from the past 240 hour(s)).   Radiological Exams on Admission: Dg Chest 2 View  03/08/2016  CLINICAL DATA:  Acute onset of shortness of breath, and bilateral hand and foot swelling. Initial encounter. EXAM: CHEST  2 VIEW COMPARISON:  Chest radiograph performed 10/10/2015 FINDINGS: The lungs are well-aerated. Pulmonary vascularity is at the upper limits of normal. There is no evidence of focal opacification, pleural effusion or pneumothorax. The heart is enlarged.  No acute osseous abnormalities are seen. IMPRESSION: Cardiomegaly.  Lungs remain grossly clear. Electronically Signed   By: Garald Balding M.D.   On: 03/08/2016 00:52    Assessment/Plan Principal Problem:   Renal failure (ARF), acute on chronic (HCC) Active Problems:   Dyslipidemia   Essential hypertension   PAF (paroxysmal atrial fibrillation) (HCC)   Obesity hypoventilation syndrome ([place])   COPD mixed type ([place])   ARF (acute renal failure) ([place])    #1. Acute on chronic renal failure stage 3-4 oliguric - patient did give a sample of urine and UA is pending. I have also ordered a FENa. Since patient also has some abdominal discomfort with decreased urine output I have ordered a stat CT to check for any obstruction. For now I will hold off patient's Lasix and metolazone and wait for urine studies and CT findings for any further recommendations. May need nephrology input. #2. Pain and swelling of both hands - check sedimentation rate and uric acid levels. Patient states she has been referred to rheumatologist by her primary care and has  an appointment. May need steroid dose if sedimentation rate are elevated. #3. Abdominal discomfort -  follow CT abdomen and pelvis which has been ordered. #4. Chronic systolic heart failure last EF measured was 40-45% in 2016 with normal cardiac cath as per the cardiology notes - presently holding off diuretics secondary to acute renal failure.  #5. History of paroxysmal atrial fibrillation - chads 2 vasc score is 3 but patient is not on anticoagulant reasons are not clear. Continue Rythmol and Cardizem and Coreg. #6. COPD - presently not wheezing. Continue inhaler. #7. Obesity hypoventilation - uses home oxygen at bedtime. #8. Chronic anemia - follow CBC.   DVT prophylaxis: Heparin. Code Status: Full code.  Family Communication: No family at the bedside.  Disposition Plan: Home.  Consults called: None.  Admission status: Inpatient. Telemetry. Likely stay 2-3 days.    Rise Patience MD [facility] Pager [phone].  If 7PM-7AM, please contact night-coverage [email or web address] Password [facility]  03/08/2016, 5:48 AM

## 2016-03-09 LAB — BASIC METABOLIC PANEL
Anion gap: 9 (ref 5–15)
BUN: 81 mg/dL — AB (ref 6–20)
CHLORIDE: 95 mmol/L — AB (ref 101–111)
CO2: 34 mmol/L — ABNORMAL HIGH (ref 22–32)
CREATININE: 2.47 mg/dL — AB (ref 0.44–1.00)
Calcium: 8.8 mg/dL — ABNORMAL LOW (ref 8.9–10.3)
GFR, EST AFRICAN AMERICAN: 24 mL/min — AB (ref >60)
GFR, EST NON AFRICAN AMERICAN: 21 mL/min — AB (ref >60)
Glucose, Bld: 140 mg/dL — ABNORMAL HIGH (ref 65–99)
POTASSIUM: 3.1 mmol/L — AB (ref 3.5–5.1)
SODIUM: 138 mmol/L (ref 135–145)

## 2016-03-09 LAB — MAGNESIUM: MAGNESIUM: 2.2 mg/dL (ref 1.7–2.4)

## 2016-03-09 LAB — ANTINUCLEAR ANTIBODIES, IFA: ANA Ab, IFA: NEGATIVE

## 2016-03-09 MED ORDER — POTASSIUM CHLORIDE CRYS ER 20 MEQ PO TBCR
40.0000 meq | EXTENDED_RELEASE_TABLET | Freq: Two times a day (BID) | ORAL | Status: AC
  Administered 2016-03-09 (×2): 40 meq via ORAL
  Filled 2016-03-09: qty 2
  Filled 2016-03-09: qty 4
  Filled 2016-03-09: qty 2

## 2016-03-09 MED ORDER — COLCHICINE 0.6 MG PO TABS: 0.6000 mg | ORAL_TABLET | Freq: Every day | ORAL | Status: DC

## 2016-03-09 MED ORDER — SODIUM CHLORIDE 0.9 % IV SOLN
INTRAVENOUS | Status: DC
  Administered 2016-03-09 (×2): via INTRAVENOUS

## 2016-03-09 MED ORDER — POTASSIUM CHLORIDE CRYS ER 20 MEQ PO TBCR
40.0000 meq | EXTENDED_RELEASE_TABLET | Freq: Four times a day (QID) | ORAL | Status: AC
  Administered 2016-03-09 (×2): 40 meq via ORAL
  Filled 2016-03-09 (×2): qty 2

## 2016-03-09 MED ORDER — LOPERAMIDE HCL 2 MG PO CAPS
2.0000 mg | ORAL_CAPSULE | Freq: Four times a day (QID) | ORAL | Status: DC | PRN
  Administered 2016-03-09: 2 mg via ORAL
  Filled 2016-03-09: qty 1

## 2016-03-09 NOTE — Clinical Documentation Improvement (Signed)
Internal Medicine  Can the diagnosis of "Hypoxia" documented in ED be further specified? Please document response in next progress note. Thank you!   Respiratory Failure - acute, chronic, acute on chronic with Hypoxia, Hypercapnea, Combination of Both  Respiratory Failure ruled out  Respiratory Distress  Other  Clinically Undetermined  Document any associated diagnoses/conditions.  Supporting Information:  "Hypoxic to 83% on RA which corrects to 100% on 2L [place]. Pt has history of O2 requirement at night only"  "Of note she is on home oxygen which will be continued"   Please exercise your independent, professional judgment when responding. A specific answer is not anticipated or expected.  Thank You,  Zoila Shutter RN, BSN, [place] [phone]; Cell: [phone]

## 2016-03-09 NOTE — Progress Notes (Signed)
PROGRESS NOTE                                                                                                                                                                                                             Patient Demographics:    Paige Marshall, is a 60 y.o. female, DOB - 21-Mar-1956, [id number]  Admit date - 03/07/2016   Admitting Physician Rise Patience, MD  Outpatient Primary MD for the patient is EDWARDS, Milford Cage, NP  LOS - 1  Chief Complaint  Patient presents with  . Abdominal Pain  . Joint Swelling       Brief Narrative     Paige Marshall is a 60 y.o. female with medical history significant of chronic kidney disease stage III, chronic systolic heart failure last EF measured was 40-45% in 2016, paroxysmal atrial fibrillation, COPD, obesity hypoventilation does not wear CPAP presents to the ER because of increasing swelling in both hands and abdominal discomfort. Patient states she has been on increasing swelling in both hands over the last 1 week and her primary care did give her a course of prednisone. Despite the patient's willingness to persistent and patient also has benign increasing abdominal discomfort with some nausea denies any vomiting or diarrhea. In the ER patient's lab work show creatinine elevated at 3.79 and this is increased from base of 1.6 few months ago. Patient has been chronically using diclofenac for arthritis.   Patient is also on Lasix and metolazone for systolic heart failure. Patient on exam does not have any abdominal tenderness and JVD is not elevated. Patient states she has not gained any weight. Both her hands look mildly puffy but no signs of any infection. Patient also has been having decreasing urine output over the last few days.    Subjective:    [name] today has, No headache, No chest pain, No abdominal pain - No Nausea, No new weakness  tingling or numbness, No Cough - SOB. Has some diffuse pains and aches & edema in her hands.   Assessment  & Plan :    1. Acute renal failure on chronic kidney disease stage III. Baseline creatinine of 1.6, she was dehydrated, was on NSAIDs, Lasix along with Zaroxolyn. Improved with gentle hydration which we'll continue, CT scan of abdomen and pelvis rules out any obstruction.  2. Diffuse aches and pains with swelling in both hands. Uric acid of 14, has diffuse gout, will place on IV steroids along with colchicine, with considerable improvement, ANA pending.  3. Generalized abdominal discomfort. CT abdomen and pelvis unremarkable. Supportive care. No nausea, no emesis, no blood or mucus in stool. Normal exam benign.  4. Systolic heart failure EF 45%. Currently dehydrated. For now continue Coreg. Avoid ACE/ARB due to acute renal failure.  5. Paroxysmal atrial fibrillation. [place] vasc 2 score of 3. She is on Rythmol, Cardizem and Coreg will be continued. Not on anticoagulation, will defer this to primary cardiologist and PCP.  6. Severe hypokalemia. Likely due to excessive diuresis, replaced and monitor closely. Stable mag levels.  7. History of COPD and obesity related hypoventilation syndrome. At baseline, no wheezing no acute issues. Supportive care. Of note she is on home oxygen which will be continued.  8. Anemia of chronic disease. Stable.    Code Status :  Full  Family Communication  :  None present  Disposition Plan  : Home tomorrow  Consults  :  None  Procedures  :    CT abdomen pelvis. Nonacute  DVT Prophylaxis  :   Heparin    Lab Results  Component Value Date   PLT 192 03/08/2016    Inpatient Medications  Scheduled Meds: . allopurinol  300 mg Oral Daily  . carvedilol  50 mg Oral BID WC  . colchicine  0.6 mg Oral Daily  . diltiazem  180 mg Oral Daily  . heparin  5,000 Units Subcutaneous Q8H  . methylPREDNISolone (SOLU-MEDROL) injection  80 mg Intravenous Daily    . multivitamin with minerals  1 tablet Oral Daily  . omega-3 acid ethyl esters  1 g Oral BID  . potassium chloride  10 mEq Intravenous Once  . potassium chloride  40 mEq Oral Q6H  . potassium chloride  40 mEq Oral BID  . propafenone  150 mg Oral Q8H  . sodium chloride flush  3 mL Intravenous Q12H   Continuous Infusions: . sodium chloride 100 mL/hr at 03/09/16 0611   PRN Meds:.acetaminophen **OR** [DISCONTINUED] acetaminophen, albuterol, hydrALAZINE, [DISCONTINUED] ondansetron **OR** ondansetron (ZOFRAN) IV, oxyCODONE  Antibiotics  :    Anti-infectives    None         Objective:   Filed Vitals:   03/08/16 0356 03/08/16 1125 03/08/16 2027 03/09/16 0603  BP: 175/81 121/61 133/77 124/77  Pulse: 66  61 65  Temp: 98 F (36.7 C)  98.2 F (36.8 C) 97.9 F (36.6 C)  TempSrc: Oral  Oral Oral  Resp: 18  18 18   Height: 5\' 7"  (1.702 m)     Weight: 157.171 kg (346 lb 8 oz)   158.124 kg (348 lb 9.6 oz)  SpO2: 93%  99% 100%    Wt Readings from Last 3 Encounters:  03/09/16 158.124 kg (348 lb 9.6 oz)  10/24/15 155.039 kg (341 lb 12.8 oz)  10/12/15 158.6 kg (349 lb 10.4 oz)     Intake/Output Summary (Last 24 hours) at 03/09/16 0915 Last data filed at 03/09/16 0700  Gross per 24 hour  Intake   2080 ml  Output   1300 ml  Net    780 ml     Physical Exam  Awake Alert, Oriented X 3, No new F.N deficits, Normal affect [place].AT,PERRAL Supple Neck,No JVD, No cervical lymphadenopathy appriciated.  Symmetrical Chest wall movement, Good air movement bilaterally, CTAB RRR,No Gallops,Rubs or new Murmurs, No Parasternal Heave +  ve B.Sounds, Abd Soft, No tenderness, No organomegaly appriciated, No rebound - guarding or rigidity. No Cyanosis, Clubbing or edema, No new Rash or bruise , Mild swelling in both hands, no pitting edema, no particular joint tenderness or effusion.    Data Review:    CBC  Recent Labs Lab 03/08/16 0029 03/08/16 0621  WBC 7.0 5.4  HGB 9.0* 8.5*  HCT  27.9* 25.9*  PLT 240 192  MCV 91.8 89.3  MCH 29.6 29.3  MCHC 32.3 32.8  RDW 17.9* 17.5*  LYMPHSABS  --  1.1  MONOABS  --  0.3  EOSABS  --  0.1  BASOSABS  --  0.0    Chemistries   Recent Labs Lab 03/08/16 0029 03/08/16 0621 03/08/16 1344 03/08/16 1850 03/09/16 0423  NA 137 137  --   --  138  K 2.5* 2.5* 3.2* 3.3* 3.1*  CL 88* 91*  --   --  95*  CO2 35* 35*  --   --  34*  GLUCOSE 152* 146*  --   --  140*  BUN 86* 85*  --   --  81*  CREATININE 3.79* 3.60*  --   --  2.47*  CALCIUM 8.8* 8.5*  --   --  8.8*  MG  --   --   --   --  2.2  AST 27 25  --   --   --   ALT 25 22  --   --   --   ALKPHOS 47 44  --   --   --   BILITOT 1.4* 1.4*  --   --   --    ------------------------------------------------------------------------------------------------------------------ No results for input(s): CHOL, HDL, LDLCALC, TRIG, CHOLHDL, LDLDIRECT in the last 72 hours.  Lab Results  Component Value Date   HGBA1C  10/02/2009    5.6 (NOTE) The ADA recommends the following therapeutic goal for glycemic control related to Hgb A1c measurement: Goal of therapy: <6.5 Hgb A1c  Reference: American Diabetes Association: Clinical Practice Recommendations 2010, Diabetes Care, 2010, 33: (Suppl  1).   ------------------------------------------------------------------------------------------------------------------ No results for input(s): TSH, T4TOTAL, T3FREE, THYROIDAB in the last 72 hours.  Invalid input(s): FREET3 ------------------------------------------------------------------------------------------------------------------ No results for input(s): VITAMINB12, FOLATE, FERRITIN, TIBC, IRON, RETICCTPCT in the last 72 hours.  Coagulation profile No results for input(s): INR, PROTIME in the last 168 hours.  No results for input(s): DDIMER in the last 72 hours.  Cardiac Enzymes No results for input(s): CKMB, TROPONINI, MYOGLOBIN in the last 168 hours.  Invalid input(s):  CK ------------------------------------------------------------------------------------------------------------------    Component Value Date/Time   BNP 47.6 03/08/2016 0029    Micro Results No results found for this or any previous visit (from the past 240 hour(s)).  Radiology Reports Ct Abdomen Pelvis Wo Contrast  03/08/2016  CLINICAL DATA:  Acute renal failure EXAM: CT ABDOMEN AND PELVIS WITHOUT CONTRAST TECHNIQUE: Multidetector CT imaging of the abdomen and pelvis was performed following the standard protocol without IV contrast. COMPARISON:  None. FINDINGS: Lower chest:  Mild cardiomegaly.  Clear lung bases. Hepatobiliary: Normal liver.  Normal gallbladder. Pancreas: Normal. Spleen: Normal. Adrenals/Urinary Tract: Normal adrenal glands. Normal kidneys. No urolithiasis or obstructive uropathy. Normal decompressed bladder. Stomach/Bowel: No bowel wall thickening or dilatation. No pneumatosis, pneumoperitoneum or portal venous gas. No abdominal or pelvic free fluid. Vascular/Lymphatic: Normal caliber abdominal aorta with mild atherosclerosis. No lymphadenopathy. Reproductive: Normal uterus.  No adnexal mass. Other: No fluid collection or hematoma. Musculoskeletal: Degenerative facet arthropathy throughout the lumbar spine.  No aggressive osseous lesion. Mild degenerative changes of the right sacroiliac joint. IMPRESSION: 1. No acute abdominal or pelvic pathology. 2. Mild stable cardiomegaly. Electronically Signed   By: Kathreen Devoid   On: 03/08/2016 09:36   Dg Chest 2 View  03/08/2016  CLINICAL DATA:  Acute onset of shortness of breath, and bilateral hand and foot swelling. Initial encounter. EXAM: CHEST  2 VIEW COMPARISON:  Chest radiograph performed 10/10/2015 FINDINGS: The lungs are well-aerated. Pulmonary vascularity is at the upper limits of normal. There is no evidence of focal opacification, pleural effusion or pneumothorax. The heart is enlarged.  No acute osseous abnormalities are seen.  IMPRESSION: Cardiomegaly.  Lungs remain grossly clear. Electronically Signed   By: Garald Balding M.D.   On: 03/08/2016 00:52    Time Spent in minutes  30   Xan Ingraham K M.D on 03/09/2016 at 9:15 AM  Between 7am to 7pm - Pager - [phone]  After 7pm go to [email or web address] - password [facility]  [facility] -  Office  [phone]

## 2016-03-10 LAB — BASIC METABOLIC PANEL
Anion gap: 8 (ref 5–15)
BUN: 68 mg/dL — AB (ref 6–20)
CALCIUM: 8.8 mg/dL — AB (ref 8.9–10.3)
CO2: 32 mmol/L (ref 22–32)
CREATININE: 1.79 mg/dL — AB (ref 0.44–1.00)
Chloride: 100 mmol/L — ABNORMAL LOW (ref 101–111)
GFR, EST AFRICAN AMERICAN: 35 mL/min — AB (ref >60)
GFR, EST NON AFRICAN AMERICAN: 30 mL/min — AB (ref >60)
Glucose, Bld: 162 mg/dL — ABNORMAL HIGH (ref 65–99)
Potassium: 4.2 mmol/L (ref 3.5–5.1)
SODIUM: 140 mmol/L (ref 135–145)

## 2016-03-10 MED ORDER — COLCHICINE 0.6 MG PO TABS: 0.6000 mg | ORAL_TABLET | Freq: Every day | ORAL | Status: DC

## 2016-03-10 MED ORDER — PREDNISONE 5 MG PO TABS: ORAL_TABLET | ORAL | Status: DC

## 2016-03-10 MED ORDER — FUROSEMIDE 40 MG PO TABS: 40.0000 mg | ORAL_TABLET | Freq: Every day | ORAL | Status: DC

## 2016-03-10 NOTE — Discharge Instructions (Signed)
Follow with Primary MD EDWARDS, MICHELLE P, NP in 7 days   Get CBC, CMP, Uric Acid  by Primary MD next visit.    Activity: As tolerated with Full fall precautions use walker/cane & assistance as needed   Disposition Home     Diet:   Heart Healthy   Check your Weight same time everyday, if you gain over 2 pounds, or you develop in leg swelling, experience more shortness of breath or chest pain, call your Primary MD immediately. Follow Cardiac Low Salt Diet and 1.5 lit/day fluid restriction.   On your next visit with your primary care physician please Get Medicines reviewed and adjusted.   Please request your Prim.MD to go over all Hospital Tests and Procedure/Radiological results at the follow up, please get all Hospital records sent to your Prim MD by signing hospital release before you go home.   If you experience worsening of your admission symptoms, develop shortness of breath, life threatening emergency, suicidal or homicidal thoughts you must seek medical attention immediately by calling 911 or calling your MD immediately  if symptoms less severe.  You Must read complete instructions/literature along with all the possible adverse reactions/side effects for all the Medicines you take and that have been prescribed to you. Take any new Medicines after you have completely understood and accpet all the possible adverse reactions/side effects.   Do not drive, operate heavy machinery, perform activities at heights, swimming or participation in water activities or provide baby sitting services if your were admitted for syncope or siezures until you have seen by Primary MD or a Neurologist and advised to do so again.  Do not drive when taking Pain medications.    Do not take more than prescribed Pain, Sleep and Anxiety Medications  Special Instructions: If you have smoked or chewed Tobacco  in the last 2 yrs please stop smoking, stop any regular Alcohol  and or any Recreational drug  use.  Wear Seat belts while driving.   Please note  You were cared for by a hospitalist during your hospital stay. If you have any questions about your discharge medications or the care you received while you were in the hospital after you are discharged, you can call the unit and asked to speak with the hospitalist on call if the hospitalist that took care of you is not available. Once you are discharged, your primary care physician will handle any further medical issues. Please note that NO REFILLS for any discharge medications will be authorized once you are discharged, as it is imperative that you return to your primary care physician (or establish a relationship with a primary care physician if you do not have one) for your aftercare needs so that they can reassess your need for medications and monitor your lab values.

## 2016-03-10 NOTE — Discharge Summary (Signed)
Paige Marshall, is a 60 y.o. female  DOB 1956/09/07  MRN [id number].  Admission date:  03/07/2016  Admitting Physician  Rise Patience, MD  Discharge Date:  03/10/2016   Primary MD  EDWARDS, Milford Cage, NP  Recommendations for primary care physician for things to follow:   Check CBC, CMP, uric acid level within a week. Monitor gout closely.  Monitor diuretic dose, she was extremely dehydrated and acute renal failure this admission.   Admission Diagnosis  Hypokalemia [E87.6] Hypoxia [R09.02] AKI (acute kidney injury) ([place]) [N17.9]   Discharge Diagnosis  Hypokalemia [E87.6] Hypoxia [R09.02] AKI (acute kidney injury) ([place]) [N17.9]    Principal Problem:   Renal failure (ARF), acute on chronic (HCC) Active Problems:   Dyslipidemia   Essential hypertension   PAF (paroxysmal atrial fibrillation) (HCC)   Obesity hypoventilation syndrome (HCC)   COPD mixed type (HCC)   ARF (acute renal failure) ([place])      Past Medical History  Diagnosis Date  . CHF (congestive heart failure) ([place])   . Hypertension   . Paroxysmal atrial fibrillation (HCC)   . High cholesterol   . Former smoker   . Dyspnea     progressive  . Edema     Lower extremity  . Coronary artery disease   . Asthma   . Pneumonia X 2  . OSA (obstructive sleep apnea)     "never sent CPAP out" (01/14/2015)  . Arthritis     "both knees" (01/14/2015)  . Degenerative joint disease of both lower legs   . Gout     hx in "both knees" (01/14/2015)  . Nonischemic cardiomyopathy [facility])     Past Surgical History  Procedure Laterality Date  . Breath tek h pylori N/A 12/31/2013    Procedure: BREATH TEK H PYLORI;  Surgeon: Gayland Curry, MD;  Location: [facility];  Service: General;  Laterality: N/A;  . Doppler echocardiography      2 D   EF of 45%   . Angiogram/lv (congenital)  2007  . Sleep study  2011  . Stress myocardial dipyridamole perfusion    . Venous duplex ultrasound  2013  . Coronary angioplasty with stent placement  11/04/2005    "1"  . Tubal ligation  1982  . Eye surgery    . Corneal transplant Right ~ 2010  . Left heart catheterization with coronary angiogram N/A 01/20/2015    Procedure: LEFT HEART CATHETERIZATION WITH CORONARY ANGIOGRAM;  Surgeon: Lorretta Harp, MD;  Location: [facility] C[facility] LAB;  Service: Cardiovascular;  Laterality: N/A;       HPI  from the history and physical done on the day of admission:    Paige Marshall is a 60 y.o. female with medical history significant of chronic kidney disease stage III, chronic systolic heart failure last EF measured was 40-45% in 2016, paroxysmal atrial fibrillation, COPD, obesity hypoventilation does not wear CPAP presents to the ER because of increasing swelling in both hands and abdominal discomfort. Patient states she has been  on increasing swelling in both hands over the last 1 week and her primary care did give her a course of prednisone. Despite the patient's willingness to persistent and patient also has benign increasing abdominal discomfort with some nausea denies any vomiting or diarrhea. In the ER patient's lab work show creatinine elevated at 3.79 and this is increased from base of 1.6 few months ago. Patient has been chronically using diclofenac for arthritis.   Patient is also on Lasix and metolazone for systolic heart failure. Patient on exam does not have any abdominal tenderness and JVD is not elevated. Patient states she has not gained any weight. Both her hands look mildly puffy but no signs of any infection. Patient also has been having decreasing urine output over the last few days.     Hospital Course:      1. Acute renal failure on chronic kidney disease stage III. Baseline creatinine of 1.6, she was extremely dehydrated, she was on NSAIDs, Lasix  along with Zaroxolyn. Improved after IVF, CT scan of abdomen and pelvis ruled out any obstruction. Renal function back to baseline, will be discharged, will cut down Lasix dose considerably upon discharge and stop Zaroxolyn. PCP to monitor weight, fluid status and diuretic dose closely along with BMP.  2. Diffuse aches and pains with swelling in both hands. Uric acid of 14, had diffuse gout, ANA negative so I doubt she had rheumatoid arthritis, much improved and almost symptom-free after IV steroids and colchicine, transition to oral steroid taper and oral colchicine, has outpatient rheumatology follow-up already set. Hold allopurinol for now.  3. Generalized abdominal discomfort. CT abdomen and pelvis unremarkable. Resolved completely. No nausea, no emesis, no blood or mucus in stool. Normal exam benign.  4. Systolic heart failure EF 45%. Currently dehydrated. For now continue Coreg. Avoid ACE/ARB due to acute renal failure.  5. Paroxysmal atrial fibrillation. [place] vasc 2 score of 3. She is on Rythmol, Cardizem and Coreg will be continued. Not on anticoagulation, will defer this to primary cardiologist and PCP.  6. Severe hypokalemia. Due to excessive diuresis, replaced and stable. Stable mag levels.  7. History of COPD and obesity related hypoventilation syndrome. At baseline, no wheezing no acute issues. Supportive care. Of note she is on home oxygen which will be continued.  8. Anemia of chronic disease. Stable.    Follow UP  Follow-up Information    Follow up with EDWARDS, MICHELLE P, NP. Schedule an appointment as soon as possible for a visit in 1 week.   Specialty:  Internal Medicine   Contact information:   [place] [address] [phone]        Consults obtained - None  Discharge Condition: Stable  Diet and Activity recommendation: See Discharge Instructions below  Discharge Instructions       Discharge Instructions    Diet - low sodium heart  healthy    Complete by:  As directed      Discharge instructions    Complete by:  As directed   Follow with Primary MD EDWARDS, MICHELLE P, NP in 7 days   Get CBC, CMP, Uric Acid  by Primary MD next visit.    Activity: As tolerated with Full fall precautions use walker/cane & assistance as needed   Disposition Home     Diet:   Heart Healthy   Check your Weight same time everyday, if you gain over 2 pounds, or you develop in leg swelling, experience more shortness of breath or  chest pain, call your Primary MD immediately. Follow Cardiac Low Salt Diet and 1.5 lit/day fluid restriction.   On your next visit with your primary care physician please Get Medicines reviewed and adjusted.   Please request your Prim.MD to go over all Hospital Tests and Procedure/Radiological results at the follow up, please get all Hospital records sent to your Prim MD by signing hospital release before you go home.   If you experience worsening of your admission symptoms, develop shortness of breath, life threatening emergency, suicidal or homicidal thoughts you must seek medical attention immediately by calling 911 or calling your MD immediately  if symptoms less severe.  You Must read complete instructions/literature along with all the possible adverse reactions/side effects for all the Medicines you take and that have been prescribed to you. Take any new Medicines after you have completely understood and accpet all the possible adverse reactions/side effects.   Do not drive, operate heavy machinery, perform activities at heights, swimming or participation in water activities or provide baby sitting services if your were admitted for syncope or siezures until you have seen by Primary MD or a Neurologist and advised to do so again.  Do not drive when taking Pain medications.    Do not take more than prescribed Pain, Sleep and Anxiety Medications  Special Instructions: If you have smoked or chewed Tobacco   in the last 2 yrs please stop smoking, stop any regular Alcohol  and or any Recreational drug use.  Wear Seat belts while driving.   Please note  You were cared for by a hospitalist during your hospital stay. If you have any questions about your discharge medications or the care you received while you were in the hospital after you are discharged, you can call the unit and asked to speak with the hospitalist on call if the hospitalist that took care of you is not available. Once you are discharged, your primary care physician will handle any further medical issues. Please note that NO REFILLS for any discharge medications will be authorized once you are discharged, as it is imperative that you return to your primary care physician (or establish a relationship with a primary care physician if you do not have one) for your aftercare needs so that they can reassess your need for medications and monitor your lab values.     Increase activity slowly    Complete by:  As directed              Discharge Medications       Medication List    STOP taking these medications        allopurinol 300 MG tablet  Commonly known as:  ZYLOPRIM     diclofenac 75 MG EC tablet  Commonly known as:  VOLTAREN     metolazone 2.5 MG tablet  Commonly known as:  ZAROXOLYN     predniSONE 50 MG tablet  Commonly known as:  DELTASONE  Replaced by:  predniSONE 5 MG tablet      TAKE these medications        albuterol 108 (90 Base) MCG/ACT inhaler  Commonly known as:  PROVENTIL HFA;VENTOLIN HFA  Inhale 2 puffs into the lungs every 6 (six) hours as needed for wheezing or shortness of breath.     carvedilol 25 MG tablet  Commonly known as:  COREG  TAKE 2 TABLETS (50 MG TOTAL) BY MOUTH 2 (TWO) TIMES DAILY.     colchicine 0.6 MG tablet  Take 1  tablet (0.6 mg total) by mouth daily.  Start taking on:  03/13/2016     diltiazem 180 MG 24 hr capsule  Commonly known as:  DILACOR XR  TAKE 1 CAPSULE (180 MG TOTAL)  BY MOUTH DAILY.     FISH OIL CONCENTRATE PO  Take 1 capsule by mouth daily.     furosemide 40 MG tablet  Commonly known as:  LASIX  Take 1 tablet (40 mg total) by mouth daily.  Start taking on:  03/12/2016     multivitamin with minerals Tabs tablet  Take 1 tablet by mouth daily.     oxyCODONE 5 MG immediate release tablet  Commonly known as:  Oxy IR/ROXICODONE  Take 1 tablet (5 mg total) by mouth every 6 (six) hours as needed for moderate pain.     predniSONE 5 MG tablet  Commonly known as:  DELTASONE  Label  & dispense according to the schedule below. 10 Pills PO for 3 days then, 8 Pills PO for 3 days, 6 Pills PO for 3 days, 4 Pills PO for 3 days, 2 Pills PO for 3 days, 1 Pills PO for 3 days, 1/2 Pill  PO for 3 days then STOP. Total 95 pills.     propafenone 150 MG tablet  Commonly known as:  RYTHMOL  TAKE 1 TABLET (150 MG TOTAL) BY MOUTH EVERY 8 (EIGHT) HOURS.        Major procedures and Radiology Reports - PLEASE review detailed and final reports for all details, in brief -   CT abdomen pelvis. Nonacute   Ct Abdomen Pelvis Wo Contrast  03/08/2016  CLINICAL DATA:  Acute renal failure EXAM: CT ABDOMEN AND PELVIS WITHOUT CONTRAST TECHNIQUE: Multidetector CT imaging of the abdomen and pelvis was performed following the standard protocol without IV contrast. COMPARISON:  None. FINDINGS: Lower chest:  Mild cardiomegaly.  Clear lung bases. Hepatobiliary: Normal liver.  Normal gallbladder. Pancreas: Normal. Spleen: Normal. Adrenals/Urinary Tract: Normal adrenal glands. Normal kidneys. No urolithiasis or obstructive uropathy. Normal decompressed bladder. Stomach/Bowel: No bowel wall thickening or dilatation. No pneumatosis, pneumoperitoneum or portal venous gas. No abdominal or pelvic free fluid. Vascular/Lymphatic: Normal caliber abdominal aorta with mild atherosclerosis. No lymphadenopathy. Reproductive: Normal uterus.  No adnexal mass. Other: No fluid collection or hematoma.  Musculoskeletal: Degenerative facet arthropathy throughout the lumbar spine. No aggressive osseous lesion. Mild degenerative changes of the right sacroiliac joint. IMPRESSION: 1. No acute abdominal or pelvic pathology. 2. Mild stable cardiomegaly. Electronically Signed   By: Kathreen Devoid   On: 03/08/2016 09:36   Dg Chest 2 View  03/08/2016  CLINICAL DATA:  Acute onset of shortness of breath, and bilateral hand and foot swelling. Initial encounter. EXAM: CHEST  2 VIEW COMPARISON:  Chest radiograph performed 10/10/2015 FINDINGS: The lungs are well-aerated. Pulmonary vascularity is at the upper limits of normal. There is no evidence of focal opacification, pleural effusion or pneumothorax. The heart is enlarged.  No acute osseous abnormalities are seen. IMPRESSION: Cardiomegaly.  Lungs remain grossly clear. Electronically Signed   By: Garald Balding M.D.   On: 03/08/2016 00:52    Micro Results     No results found for this or any previous visit (from the past 240 hour(s)).   Today   Subjective    Paige Marshall today has no headache,no chest abdominal pain,no new weakness tingling or numbness, feels much better wants to go home today.    Objective   Blood pressure 152/83, pulse 69, temperature 97.8 F (36.6 C),  temperature source Oral, resp. rate 18, height 5\' 7"  (1.702 m), weight 160.891 kg (354 lb 11.2 oz), last menstrual period 09/07/2011, SpO2 96 %.   Intake/Output Summary (Last 24 hours) at 03/10/16 1031 Last data filed at 03/10/16 0734  Gross per 24 hour  Intake 2353.75 ml  Output    500 ml  Net 1853.75 ml    Exam Awake Alert, Oriented x 3, No new F.N deficits, Normal affect [place].AT,PERRAL Supple Neck,No JVD, No cervical lymphadenopathy appriciated.  Symmetrical Chest wall movement, Good air movement bilaterally, CTAB RRR,No Gallops,Rubs or new Murmurs, No Parasternal Heave +ve B.Sounds, Abd Soft, Non tender, No organomegaly appriciated, No rebound -guarding or  rigidity. No Cyanosis, Clubbing or edema, No new Rash or bruise   Data Review   CBC w Diff: Lab Results  Component Value Date   WBC 5.4 03/08/2016   HGB 8.5* 03/08/2016   HCT 25.9* 03/08/2016   PLT 192 03/08/2016   LYMPHOPCT 21 03/08/2016   MONOPCT 6 03/08/2016   EOSPCT 1 03/08/2016   BASOPCT 0 03/08/2016    CMP: Lab Results  Component Value Date   NA 140 03/10/2016   K 4.2 03/10/2016   CL 100* 03/10/2016   CO2 32 03/10/2016   BUN 68* 03/10/2016   CREATININE 1.79* 03/10/2016   CREATININE 1.31* 11/19/2013   PROT 6.9 03/08/2016   ALBUMIN 4.0 03/08/2016   BILITOT 1.4* 03/08/2016   ALKPHOS 44 03/08/2016   AST 25 03/08/2016   ALT 22 03/08/2016  .   Total Time in preparing paper work, data evaluation and todays exam - 35 minutes  Thurnell Lose M.D on 03/10/2016 at 10:31 AM  [facility]   Office  [phone]

## 2016-03-24 DIAGNOSIS — M79671 Pain in right foot: Secondary | ICD-10-CM | POA: Diagnosis not present

## 2016-03-24 DIAGNOSIS — M79642 Pain in left hand: Secondary | ICD-10-CM | POA: Diagnosis not present

## 2016-03-24 DIAGNOSIS — M2550 Pain in unspecified joint: Secondary | ICD-10-CM | POA: Diagnosis not present

## 2016-03-24 DIAGNOSIS — M79672 Pain in left foot: Secondary | ICD-10-CM | POA: Diagnosis not present

## 2016-03-24 DIAGNOSIS — M1A09X1 Idiopathic chronic gout, multiple sites, with tophus (tophi): Secondary | ICD-10-CM | POA: Diagnosis not present

## 2016-03-24 DIAGNOSIS — M79641 Pain in right hand: Secondary | ICD-10-CM | POA: Diagnosis not present

## 2016-04-04 ENCOUNTER — Emergency Department (HOSPITAL_COMMUNITY): Payer: Medicare Other

## 2016-04-04 ENCOUNTER — Other Ambulatory Visit: Payer: Self-pay

## 2016-04-04 ENCOUNTER — Encounter (HOSPITAL_COMMUNITY): Payer: Self-pay | Admitting: Emergency Medicine

## 2016-04-04 ENCOUNTER — Inpatient Hospital Stay (HOSPITAL_COMMUNITY)
Admission: EM | Admit: 2016-04-04 | Discharge: 2016-04-06 | DRG: 190 | Disposition: A | Discharge status: Home / Self Care | Payer: Medicare Other | Attending: Internal Medicine | Admitting: Internal Medicine

## 2016-04-04 DIAGNOSIS — Y95 Nosocomial condition: Secondary | ICD-10-CM | POA: Diagnosis present

## 2016-04-04 DIAGNOSIS — Z9981 Dependence on supplemental oxygen: Secondary | ICD-10-CM | POA: Diagnosis not present

## 2016-04-04 DIAGNOSIS — J9601 Acute respiratory failure with hypoxia: Secondary | ICD-10-CM | POA: Diagnosis present

## 2016-04-04 DIAGNOSIS — Z7952 Long term (current) use of systemic steroids: Secondary | ICD-10-CM | POA: Diagnosis not present

## 2016-04-04 DIAGNOSIS — I130 Hypertensive heart and chronic kidney disease with heart failure and stage 1 through stage 4 chronic kidney disease, or unspecified chronic kidney disease: Secondary | ICD-10-CM | POA: Diagnosis present

## 2016-04-04 DIAGNOSIS — I10 Essential (primary) hypertension: Secondary | ICD-10-CM | POA: Diagnosis not present

## 2016-04-04 DIAGNOSIS — Z79899 Other long term (current) drug therapy: Secondary | ICD-10-CM

## 2016-04-04 DIAGNOSIS — I2510 Atherosclerotic heart disease of native coronary artery without angina pectoris: Secondary | ICD-10-CM | POA: Diagnosis present

## 2016-04-04 DIAGNOSIS — J189 Pneumonia, unspecified organism: Secondary | ICD-10-CM | POA: Diagnosis present

## 2016-04-04 DIAGNOSIS — D638 Anemia in other chronic diseases classified elsewhere: Secondary | ICD-10-CM | POA: Diagnosis present

## 2016-04-04 DIAGNOSIS — M170 Bilateral primary osteoarthritis of knee: Secondary | ICD-10-CM | POA: Diagnosis present

## 2016-04-04 DIAGNOSIS — I5022 Chronic systolic (congestive) heart failure: Secondary | ICD-10-CM | POA: Diagnosis present

## 2016-04-04 DIAGNOSIS — G4733 Obstructive sleep apnea (adult) (pediatric): Secondary | ICD-10-CM | POA: Diagnosis present

## 2016-04-04 DIAGNOSIS — I480 Paroxysmal atrial fibrillation: Secondary | ICD-10-CM | POA: Diagnosis present

## 2016-04-04 DIAGNOSIS — N183 Chronic kidney disease, stage 3 (moderate): Secondary | ICD-10-CM | POA: Diagnosis present

## 2016-04-04 DIAGNOSIS — E7800 Pure hypercholesterolemia, unspecified: Secondary | ICD-10-CM | POA: Diagnosis present

## 2016-04-04 DIAGNOSIS — I428 Other cardiomyopathies: Secondary | ICD-10-CM | POA: Diagnosis present

## 2016-04-04 DIAGNOSIS — R911 Solitary pulmonary nodule: Secondary | ICD-10-CM | POA: Diagnosis present

## 2016-04-04 DIAGNOSIS — Z6841 Body Mass Index (BMI) 40.0 and over, adult: Secondary | ICD-10-CM

## 2016-04-04 DIAGNOSIS — J9621 Acute and chronic respiratory failure with hypoxia: Secondary | ICD-10-CM | POA: Diagnosis present

## 2016-04-04 DIAGNOSIS — R0602 Shortness of breath: Secondary | ICD-10-CM | POA: Diagnosis present

## 2016-04-04 DIAGNOSIS — E876 Hypokalemia: Secondary | ICD-10-CM | POA: Diagnosis present

## 2016-04-04 DIAGNOSIS — E662 Morbid (severe) obesity with alveolar hypoventilation: Secondary | ICD-10-CM | POA: Diagnosis present

## 2016-04-04 DIAGNOSIS — Z7951 Long term (current) use of inhaled steroids: Secondary | ICD-10-CM | POA: Diagnosis not present

## 2016-04-04 DIAGNOSIS — J181 Lobar pneumonia, unspecified organism: Secondary | ICD-10-CM

## 2016-04-04 DIAGNOSIS — Z87891 Personal history of nicotine dependence: Secondary | ICD-10-CM | POA: Diagnosis not present

## 2016-04-04 DIAGNOSIS — J440 Chronic obstructive pulmonary disease with acute lower respiratory infection: Principal | ICD-10-CM | POA: Diagnosis present

## 2016-04-04 DIAGNOSIS — Z8249 Family history of ischemic heart disease and other diseases of the circulatory system: Secondary | ICD-10-CM | POA: Diagnosis not present

## 2016-04-04 DIAGNOSIS — M109 Gout, unspecified: Secondary | ICD-10-CM | POA: Diagnosis present

## 2016-04-04 LAB — URINALYSIS, ROUTINE W REFLEX MICROSCOPIC
BILIRUBIN URINE: NEGATIVE
GLUCOSE, UA: NEGATIVE mg/dL
HGB URINE DIPSTICK: NEGATIVE
Ketones, ur: NEGATIVE mg/dL
Leukocytes, UA: NEGATIVE
NITRITE: NEGATIVE
PH: 6.5 (ref 5.0–8.0)
Protein, ur: NEGATIVE mg/dL
SPECIFIC GRAVITY, URINE: 1.011 (ref 1.005–1.030)

## 2016-04-04 LAB — BLOOD GAS, ARTERIAL
Acid-Base Excess: 10.8 mmol/L — ABNORMAL HIGH (ref 0.0–2.0)
BICARBONATE: 35.4 meq/L — AB (ref 20.0–24.0)
O2 CONTENT: 2 L/min
O2 Saturation: 84.3 %
PATIENT TEMPERATURE: 98.6
PCO2 ART: 48.4 mmHg — AB (ref 35.0–45.0)
PH ART: 7.477 — AB (ref 7.350–7.450)
PO2 ART: 49.2 mmHg — AB (ref 80.0–100.0)
TCO2: 32.9 mmol/L (ref 0–100)

## 2016-04-04 LAB — I-STAT CG4 LACTIC ACID, ED: LACTIC ACID, VENOUS: 2.35 mmol/L — AB (ref 0.5–2.0)

## 2016-04-04 LAB — COMPREHENSIVE METABOLIC PANEL
ALT: 17 U/L (ref 14–54)
AST: 25 U/L (ref 15–41)
Albumin: 3.8 g/dL (ref 3.5–5.0)
Alkaline Phosphatase: 50 U/L (ref 38–126)
Anion gap: 10 (ref 5–15)
BUN: 26 mg/dL — AB (ref 6–20)
CHLORIDE: 96 mmol/L — AB (ref 101–111)
CO2: 34 mmol/L — ABNORMAL HIGH (ref 22–32)
CREATININE: 1.56 mg/dL — AB (ref 0.44–1.00)
Calcium: 8.6 mg/dL — ABNORMAL LOW (ref 8.9–10.3)
GFR, EST AFRICAN AMERICAN: 42 mL/min — AB (ref >60)
GFR, EST NON AFRICAN AMERICAN: 36 mL/min — AB (ref >60)
Glucose, Bld: 127 mg/dL — ABNORMAL HIGH (ref 65–99)
POTASSIUM: 2.9 mmol/L — AB (ref 3.5–5.1)
SODIUM: 140 mmol/L (ref 135–145)
TOTAL PROTEIN: 6.9 g/dL (ref 6.5–8.1)
Total Bilirubin: 1.4 mg/dL — ABNORMAL HIGH (ref 0.3–1.2)

## 2016-04-04 LAB — CBC WITH DIFFERENTIAL/PLATELET
BASOS ABS: 0 10*3/uL (ref 0.0–0.1)
Basophils Relative: 0 %
Eosinophils Absolute: 0.1 10*3/uL (ref 0.0–0.7)
Eosinophils Relative: 1 %
HCT: 28.8 % — ABNORMAL LOW (ref 36.0–46.0)
Hemoglobin: 9.2 g/dL — ABNORMAL LOW (ref 12.0–15.0)
LYMPHS ABS: 1 10*3/uL (ref 0.7–4.0)
Lymphocytes Relative: 15 %
MCH: 28.4 pg (ref 26.0–34.0)
MCHC: 31.9 g/dL (ref 30.0–36.0)
MCV: 88.9 fL (ref 78.0–100.0)
MONO ABS: 0.3 10*3/uL (ref 0.1–1.0)
Monocytes Relative: 5 %
NEUTROS PCT: 79 %
Neutro Abs: 5.4 10*3/uL (ref 1.7–7.7)
PLATELETS: 139 10*3/uL — AB (ref 150–400)
RBC: 3.24 MIL/uL — AB (ref 3.87–5.11)
RDW: 16.2 % — AB (ref 11.5–15.5)
WBC: 6.8 10*3/uL (ref 4.0–10.5)

## 2016-04-04 LAB — TROPONIN I
TROPONIN I: 0.05 ng/mL — AB (ref <0.031)
Troponin I: 0.05 ng/mL — ABNORMAL HIGH (ref <0.031)
Troponin I: 0.05 ng/mL — ABNORMAL HIGH (ref <0.031)

## 2016-04-04 LAB — URIC ACID: URIC ACID, SERUM: 9.7 mg/dL — AB (ref 2.3–6.6)

## 2016-04-04 LAB — BRAIN NATRIURETIC PEPTIDE: B NATRIURETIC PEPTIDE 5: 85.7 pg/mL (ref 0.0–100.0)

## 2016-04-04 LAB — MAGNESIUM: Magnesium: 1.6 mg/dL — ABNORMAL LOW (ref 1.7–2.4)

## 2016-04-04 MED ORDER — ALBUTEROL SULFATE (2.5 MG/3ML) 0.083% IN NEBU: 2.5000 mg | INHALATION_SOLUTION | RESPIRATORY_TRACT | Status: DC | PRN

## 2016-04-04 MED ORDER — FUROSEMIDE 40 MG PO TABS
80.0000 mg | ORAL_TABLET | Freq: Two times a day (BID) | ORAL | Status: DC
  Administered 2016-04-04 – 2016-04-05 (×2): 80 mg via ORAL
  Filled 2016-04-04 (×2): qty 2

## 2016-04-04 MED ORDER — SODIUM CHLORIDE 0.9% FLUSH
3.0000 mL | Freq: Two times a day (BID) | INTRAVENOUS | Status: DC
  Administered 2016-04-05: 3 mL via INTRAVENOUS

## 2016-04-04 MED ORDER — MAGNESIUM SULFATE 50 % IJ SOLN: 1.0000 g | Freq: Once | INTRAMUSCULAR | Status: DC

## 2016-04-04 MED ORDER — ALBUTEROL SULFATE HFA 108 (90 BASE) MCG/ACT IN AERS: 2.0000 | INHALATION_SPRAY | Freq: Four times a day (QID) | RESPIRATORY_TRACT | Status: DC | PRN

## 2016-04-04 MED ORDER — VANCOMYCIN HCL IN DEXTROSE 1-5 GM/200ML-% IV SOLN
1000.0000 mg | Freq: Once | INTRAVENOUS | Status: AC
  Administered 2016-04-04: 1000 mg via INTRAVENOUS
  Filled 2016-04-04: qty 200

## 2016-04-04 MED ORDER — MAGNESIUM SULFATE IN D5W 1-5 GM/100ML-% IV SOLN
1.0000 g | INTRAVENOUS | Status: AC
  Administered 2016-04-04: 1 g via INTRAVENOUS
  Filled 2016-04-04: qty 100

## 2016-04-04 MED ORDER — NITROGLYCERIN 0.4 MG SL SUBL
0.4000 mg | SUBLINGUAL_TABLET | SUBLINGUAL | Status: DC | PRN
  Administered 2016-04-04 (×2): 0.4 mg via SUBLINGUAL
  Filled 2016-04-04 (×2): qty 1

## 2016-04-04 MED ORDER — ONDANSETRON HCL 4 MG PO TABS: 4.0000 mg | ORAL_TABLET | Freq: Four times a day (QID) | ORAL | Status: DC | PRN

## 2016-04-04 MED ORDER — DILTIAZEM HCL ER 180 MG PO CP24
180.0000 mg | ORAL_CAPSULE | Freq: Every day | ORAL | Status: DC
  Administered 2016-04-05 – 2016-04-06 (×2): 180 mg via ORAL
  Filled 2016-04-04 (×4): qty 1

## 2016-04-04 MED ORDER — DOCUSATE SODIUM 100 MG PO CAPS
100.0000 mg | ORAL_CAPSULE | Freq: Two times a day (BID) | ORAL | Status: DC
  Administered 2016-04-04 – 2016-04-06 (×4): 100 mg via ORAL
  Filled 2016-04-04 (×4): qty 1

## 2016-04-04 MED ORDER — SODIUM CHLORIDE 0.9 % IV SOLN: 250.0000 mL | INTRAVENOUS | Status: DC | PRN

## 2016-04-04 MED ORDER — PREDNISONE 5 MG PO TABS
10.0000 mg | ORAL_TABLET | Freq: Every day | ORAL | Status: DC
  Administered 2016-04-05: 10 mg via ORAL
  Filled 2016-04-04: qty 2

## 2016-04-04 MED ORDER — ACETAMINOPHEN 325 MG PO TABS
650.0000 mg | ORAL_TABLET | Freq: Four times a day (QID) | ORAL | Status: DC | PRN
  Administered 2016-04-04 – 2016-04-05 (×3): 650 mg via ORAL
  Filled 2016-04-04 (×3): qty 2

## 2016-04-04 MED ORDER — FEBUXOSTAT 40 MG PO TABS
40.0000 mg | ORAL_TABLET | Freq: Every day | ORAL | Status: DC
  Administered 2016-04-04 – 2016-04-06 (×3): 40 mg via ORAL
  Filled 2016-04-04 (×3): qty 1

## 2016-04-04 MED ORDER — ASPIRIN 81 MG PO CHEW
324.0000 mg | CHEWABLE_TABLET | Freq: Once | ORAL | Status: AC
  Administered 2016-04-04: 324 mg via ORAL
  Filled 2016-04-04: qty 4

## 2016-04-04 MED ORDER — CARVEDILOL 25 MG PO TABS
50.0000 mg | ORAL_TABLET | Freq: Two times a day (BID) | ORAL | Status: DC
  Administered 2016-04-04 – 2016-04-06 (×4): 50 mg via ORAL
  Filled 2016-04-04 (×4): qty 2

## 2016-04-04 MED ORDER — ENOXAPARIN SODIUM 80 MG/0.8ML ~~LOC~~ SOLN
80.0000 mg | SUBCUTANEOUS | Status: DC
  Administered 2016-04-04 – 2016-04-05 (×2): 80 mg via SUBCUTANEOUS
  Filled 2016-04-04 (×2): qty 0.8

## 2016-04-04 MED ORDER — POTASSIUM CHLORIDE CRYS ER 20 MEQ PO TBCR
20.0000 meq | EXTENDED_RELEASE_TABLET | Freq: Once | ORAL | Status: AC
  Administered 2016-04-04: 20 meq via ORAL
  Filled 2016-04-04: qty 1

## 2016-04-04 MED ORDER — PROPAFENONE HCL 150 MG PO TABS
150.0000 mg | ORAL_TABLET | Freq: Three times a day (TID) | ORAL | Status: DC
  Administered 2016-04-04 – 2016-04-06 (×6): 150 mg via ORAL
  Filled 2016-04-04 (×7): qty 1

## 2016-04-04 MED ORDER — POTASSIUM CHLORIDE 10 MEQ/100ML IV SOLN
10.0000 meq | Freq: Once | INTRAVENOUS | Status: AC
  Administered 2016-04-04: 10 meq via INTRAVENOUS
  Filled 2016-04-04: qty 100

## 2016-04-04 MED ORDER — COLCHICINE 0.6 MG PO TABS: 0.6000 mg | ORAL_TABLET | ORAL | Status: DC

## 2016-04-04 MED ORDER — DEXTROSE 5 % IV SOLN
2.0000 g | Freq: Three times a day (TID) | INTRAVENOUS | Status: DC
  Administered 2016-04-04 – 2016-04-05 (×2): 2 g via INTRAVENOUS
  Filled 2016-04-04 (×3): qty 2

## 2016-04-04 MED ORDER — POTASSIUM CHLORIDE CRYS ER 20 MEQ PO TBCR
40.0000 meq | EXTENDED_RELEASE_TABLET | Freq: Once | ORAL | Status: AC
  Administered 2016-04-04: 40 meq via ORAL
  Filled 2016-04-04: qty 2

## 2016-04-04 MED ORDER — VANCOMYCIN HCL 10 G IV SOLR
1750.0000 mg | INTRAVENOUS | Status: DC
Start: 2016-04-05 — End: 2016-04-05
  Filled 2016-04-04: qty 1750

## 2016-04-04 MED ORDER — ONDANSETRON HCL 4 MG/2ML IJ SOLN: 4.0000 mg | Freq: Four times a day (QID) | INTRAMUSCULAR | Status: DC | PRN

## 2016-04-04 MED ORDER — SODIUM CHLORIDE 0.9% FLUSH: 3.0000 mL | INTRAVENOUS | Status: DC | PRN

## 2016-04-04 MED ORDER — ACETAMINOPHEN 650 MG RE SUPP: 650.0000 mg | Freq: Four times a day (QID) | RECTAL | Status: DC | PRN

## 2016-04-04 MED ORDER — SODIUM CHLORIDE 0.9% FLUSH: 3.0000 mL | Freq: Two times a day (BID) | INTRAVENOUS | Status: DC

## 2016-04-04 MED ORDER — OXYCODONE HCL 5 MG PO TABS
5.0000 mg | ORAL_TABLET | Freq: Four times a day (QID) | ORAL | Status: DC | PRN
  Administered 2016-04-04 – 2016-04-06 (×5): 5 mg via ORAL
  Filled 2016-04-04 (×6): qty 1

## 2016-04-04 MED ORDER — FUROSEMIDE 10 MG/ML IJ SOLN
40.0000 mg | Freq: Once | INTRAMUSCULAR | Status: AC
  Administered 2016-04-04: 40 mg via INTRAVENOUS
  Filled 2016-04-04: qty 4

## 2016-04-04 MED ORDER — ENOXAPARIN SODIUM 30 MG/0.3ML ~~LOC~~ SOLN: 30.0000 mg | SUBCUTANEOUS | Status: DC

## 2016-04-04 NOTE — ED Notes (Signed)
Hospitalist at bedside 

## 2016-04-04 NOTE — ED Provider Notes (Signed)
CSN: [id number]     Arrival date & time 04/04/16  [id number] History   First MD Initiated Contact with Patient 04/04/16 1028     Chief Complaint  Patient presents with  . Shortness of Breath  . Leg Swelling   PT IS A 60 YO BM WITH A HX OF CHF.  PT SAID THAT SHE DEVELOPED SOME PAIN ALL OVER HER BODY, INCLUDING HER CHEST, LAST NIGHT.  PT SAID THAT SHE DEVELOPED SOB THIS AM.  THE PT SAID THAT THE SOB WORSENED, SO SHE CAME IN.    (Consider location/radiation/quality/duration/timing/severity/associated sxs/prior Treatment) Patient is a 60 y.o. female presenting with shortness of breath. The history is provided by the patient.  Shortness of Breath Severity:  Moderate Onset quality:  Gradual Timing:  Constant Progression:  Worsening Chronicity:  Recurrent Associated symptoms: chest pain     Past Medical History  Diagnosis Date  . CHF (congestive heart failure) ([place])   . Hypertension   . Paroxysmal atrial fibrillation ([facility])   . High cholesterol   . Former smoker   . Dyspnea     progressive  . Edema     Lower extremity  . Coronary artery disease   . Asthma   . Pneumonia X 2  . OSA (obstructive sleep apnea)     "never sent CPAP out" (01/14/2015)  . Arthritis     "both knees" (01/14/2015)  . Degenerative joint disease of both lower legs   . Gout     hx in "both knees" (01/14/2015)  . Nonischemic cardiomyopathy [facility])    Past Surgical History  Procedure Laterality Date  . Breath tek h pylori N/A 12/31/2013    Procedure: BREATH TEK H PYLORI;  Surgeon: Gayland Curry, MD;  Location: [facility];  Service: General;  Laterality: N/A;  . Doppler echocardiography      2 D   EF of 45%  . Angiogram/lv (congenital)  2007  . Sleep study  2011  . Stress myocardial dipyridamole perfusion    . Venous duplex ultrasound  2013  . Coronary angioplasty with stent placement  11/04/2005    "1"  . Tubal ligation  1982  . Eye surgery    . Corneal transplant Right ~ 2010  . Left heart catheterization with  coronary angiogram N/A 01/20/2015    Procedure: LEFT HEART CATHETERIZATION WITH CORONARY ANGIOGRAM;  Surgeon: Lorretta Harp, MD;  Location: [facility] CATH LAB;  Service: Cardiovascular;  Laterality: N/A;   Family History  Problem Relation Age of Onset  . Heart disease Mother   . Cancer Father     colon  . Hypertension Other    Social History  Substance Use Topics  . Smoking status: Former Smoker -- 0.33 packs/day for 35 years    Types: Cigarettes    Quit date: 10/11/2008  . Smokeless tobacco: Never Used  . Alcohol Use: 4.8 oz/week    8 Cans of beer per week     Comment: 01/14/2015 "~ 4, 24oz cans beer/wk"   OB History    No data available     Review of Systems  Respiratory: Positive for shortness of breath.   Cardiovascular: Positive for chest pain.  All other systems reviewed and are negative.     Allergies  Penicillins and Citrus  Home Medications   Prior to Admission medications   Medication Sig Start Date End Date Taking? Authorizing Provider  albuterol (PROVENTIL HFA;VENTOLIN HFA) 108 (90 Base) MCG/ACT inhaler Inhale 2 puffs into the lungs every 6 (  six) hours as needed for wheezing or shortness of breath. 10/24/15  Yes Deneise Lever, MD  carvedilol (COREG) 25 MG tablet TAKE 2 TABLETS (50 MG TOTAL) BY MOUTH 2 (TWO) TIMES DAILY. 08/20/15  Yes Lorretta Harp, MD  colchicine 0.6 MG tablet Take 1 tablet (0.6 mg total) by mouth daily. Patient taking differently: Take 0.6 mg by mouth See admin instructions. Take 1 tablet by mouth every 3 days 03/13/16  Yes Thurnell Lose, MD  diltiazem (DILACOR XR) 180 MG 24 hr capsule TAKE 1 CAPSULE (180 MG TOTAL) BY MOUTH DAILY. 02/02/16  Yes Lorretta Harp, MD  furosemide (LASIX) 80 MG tablet TAKE 1 TABLET (80 MG TOTAL) BY MOUTH 2 (TWO) TIMES DAILY. 03/25/16  Yes Historical Provider, MD  Multiple Vitamin (MULTIVITAMIN WITH MINERALS) TABS tablet Take 1 tablet by mouth daily.   Yes Historical Provider, MD  Omega-3 Fatty Acids (FISH OIL  CONCENTRATE PO) Take 1 capsule by mouth daily.    Yes Historical Provider, MD  predniSONE (DELTASONE) 5 MG tablet Take 5 mg by mouth daily with breakfast.   Yes Historical Provider, MD  propafenone (RYTHMOL) 150 MG tablet TAKE 1 TABLET (150 MG TOTAL) BY MOUTH EVERY 8 (EIGHT) HOURS. 01/05/16  Yes Pixie Casino, MD  oxyCODONE (OXY IR/ROXICODONE) 5 MG immediate release tablet Take 1 tablet (5 mg total) by mouth every 6 (six) hours as needed for moderate pain. Patient not taking: Reported on 04/04/2016 10/12/15   Venetia Maxon Rama, MD  predniSONE (DELTASONE) 5 MG tablet Label  & dispense according to the schedule below. 10 Pills PO for 3 days then, 8 Pills PO for 3 days, 6 Pills PO for 3 days, 4 Pills PO for 3 days, 2 Pills PO for 3 days, 1 Pills PO for 3 days, 1/2 Pill  PO for 3 days then STOP. Total 95 pills. Patient not taking: Reported on 04/04/2016 03/10/16   Thurnell Lose, MD   BP 126/78 mmHg  Pulse 64  Temp(Src) 98.1 F (36.7 C) (Oral)  Resp 14  Ht 5' 7.5" (1.715 m)  Wt 340 lb (154.223 kg)  BMI 52.43 kg/m2  SpO2 100%  LMP 09/07/2011 Physical Exam  Constitutional: She is oriented to person, place, and time. She appears well-developed and well-nourished. She appears distressed.  HENT:  Head: Normocephalic and atraumatic.  Right Ear: External ear normal.  Left Ear: External ear normal.  Nose: Nose normal.  Mouth/Throat: Oropharynx is clear and moist.  Eyes: Conjunctivae and EOM are normal. Pupils are equal, round, and reactive to light.  Neck: Normal range of motion. Neck supple.  Cardiovascular: Normal rate, regular rhythm, normal heart sounds and intact distal pulses.   Pulmonary/Chest: She is in respiratory distress. She has rales. She exhibits tenderness.  Abdominal: Soft. Bowel sounds are normal.  Musculoskeletal: Normal range of motion. She exhibits edema.  Neurological: She is alert and oriented to person, place, and time.  Skin: Skin is warm and dry.  Psychiatric: She has a  normal mood and affect. Her behavior is normal. Judgment and thought content normal.  Nursing note and vitals reviewed.   ED Course  Procedures (including critical care time) Labs Review Labs Reviewed  COMPREHENSIVE METABOLIC PANEL - Abnormal; Notable for the following:    Potassium 2.9 (*)    Chloride 96 (*)    CO2 34 (*)    Glucose, Bld 127 (*)    BUN 26 (*)    Creatinine, Ser 1.56 (*)    Calcium  8.6 (*)    Total Bilirubin 1.4 (*)    GFR calc non Af Amer 36 (*)    GFR calc Af Amer 42 (*)    All other components within normal limits  CBC WITH DIFFERENTIAL/PLATELET - Abnormal; Notable for the following:    RBC 3.24 (*)    Hemoglobin 9.2 (*)    HCT 28.8 (*)    RDW 16.2 (*)    Platelets 139 (*)    All other components within normal limits  TROPONIN I - Abnormal; Notable for the following:    Troponin I 0.05 (*)    All other components within normal limits  BLOOD GAS, ARTERIAL - Abnormal; Notable for the following:    pH, Arterial 7.477 (*)    pCO2 arterial 48.4 (*)    pO2, Arterial 49.2 (*)    Bicarbonate 35.4 (*)    Acid-Base Excess 10.8 (*)    All other components within normal limits  URINALYSIS, ROUTINE W REFLEX MICROSCOPIC (NOT AT [facility]) - Abnormal; Notable for the following:    APPearance HAZY (*)    All other components within normal limits  MAGNESIUM - Abnormal; Notable for the following:    Magnesium 1.6 (*)    All other components within normal limits  I-STAT CG4 LACTIC ACID, ED - Abnormal; Notable for the following:    Lactic Acid, Venous 2.35 (*)    All other components within normal limits  BRAIN NATRIURETIC PEPTIDE    Imaging Review Dg Chest 2 View  04/04/2016  CLINICAL DATA:  Shortness of breath, CHF, hypertension, former smoker EXAM: CHEST  2 VIEW COMPARISON:  03/08/2016 FINDINGS: Enlargement of cardiac silhouette. Atherosclerotic calcification aorta. Mediastinal contours and pulmonary vascularity otherwise normal. New poorly defined RIGHT upper lobe  opacity since 03/08/2016 question pneumonia. Lungs otherwise clear. No pneumothorax. IMPRESSION: Enlargement of cardiac silhouette. Question RIGHT upper lobe pneumonia. Aortic atherosclerosis. Electronically Signed   By: Lavonia Dana M.D.   On: 04/04/2016 10:49   Ct Chest Wo Contrast  04/04/2016  CLINICAL DATA:  Abnormal chest x-ray EXAM: CT CHEST WITHOUT CONTRAST TECHNIQUE: Multidetector CT imaging of the chest was performed following the standard protocol without IV contrast. COMPARISON:  03/08/2016 and 04/04/2016 chest x-ray FINDINGS: Cardiovascular: Cardiomegaly is noted. Atherosclerotic calcifications of coronary arteries. No pericardial effusion. There is no aortic aneurysm. Atherosclerotic calcifications of thoracic aorta. Mediastinum: 8 precarinal lymph node Measures 1 cm short-axis borderline by size criteria. No hilar adenopathy is noted on this unenhanced scan. Lungs/Pleura: As noted on chest x-ray there is pleural based nodular consolidation in right upper lobe measures about 2.5 cm. There is some traction of adjacent pleural fat. This is best seen in coronal image 134. Additional small focus of focal consolidation in right upper lobe laterally measures 8 mm. There is subtle interstitial infiltrate or pneumonitis in right middle lobe. Axial image 71 there is 5 mm nodule in right lower lobe laterally. There is no pulmonary edema. No pneumothorax. Upper Abdomen: The visualized unenhanced upper abdomen shows no adrenal gland mass. Visualized unenhanced liver and spleen is unremarkable. Musculoskeletal: No destructive bony lesions are noted. Mild degenerative changes mid and lower thoracic spine. Sagittal view of the sternum is unremarkable. No rib fractures are noted. IMPRESSION: 1. There is pleural based nodular consolidation in right upper lobe laterally measures 2.5 cm. Additional focal parenchymal consolidation in right upper lobe laterally measures 8 mm. Subtle streaky interstitial infiltrate is  noted in right middle lobe. Findings may represent multifocal pneumonia. Followup CT scan  is recommended in 3-4 weeks following trial of antibiotic therapy to ensure resolution and exclude underlying malignancy. There is 5 mm nodule in left lower lobe laterally attention should be given on follow-up examination. 2. Borderline precarinal lymph node.  No hilar adenopathy. 3. Degenerative changes thoracic spine. Electronically Signed   By: Lahoma Crocker M.D.   On: 04/04/2016 13:38   I have personally reviewed and evaluated these images and lab results as part of my medical decision-making.   EKG Interpretation None     ED ECG REPORT   Date: 04/04/2016  Rate: 57  Rhythm: NSR T WAVE INVERSIONS INF/LAT LEADS ARE OLD COMP TO 03/08/16.  NO STEMI.   MDM  PT INITIALLY PRESENTED AS FLUID OVERLOAD.  SHE FELT BETTER AFTER LASIX.  HOWEVER, DUE TO THE ABN CXR, I DID A CT CHEST WHICH SHOWED MULTIFOCAL PNEUMONIA.  PT HAD A RECENT ADMISSION 5/29-5/31, SO I TREATED HER AS HCAP.  I DID NOT GIVE PT IVFS AS SHE FELT BETTER AFTER THE LASIX.  PT D/W DR. Tyrell Antonio ([facility]) WHO WILL ADMIT HER. Final diagnoses:  Hypokalemia  Hypomagnesemia  HCAP (healthcare-associated pneumonia)       Isla Pence, MD 04/04/16 1404

## 2016-04-04 NOTE — Progress Notes (Signed)
Pharmacy Antibiotic Follow-up Note  Paige Marshall is a 60 y.o. year-old female admitted on 04/04/2016.  The patient is currently on day 1 of Vancomycin & Cefepime for HAP.  Assessment/Plan: Vancomycin 1750 mg IV every 24 hours.  Goal trough 15-20 mcg/mL.  Plan Vancomycin 1 gm x2 in ED (patient obese) Cefepime 2gm q8hr  Temp (24hrs), Avg:98.1 F (36.7 C), Min:98.1 F (36.7 C), Max:98.1 F (36.7 C)   Recent Labs Lab 04/04/16 1055  WBC 6.8    Recent Labs Lab 04/04/16 1055  CREATININE 1.56*   Estimated Creatinine Clearance: 62.4 mL/min (by C-G formula based on Cr of 1.56).    Allergies  Allergen Reactions  . Penicillins Hives, Itching, Swelling and Rash    Has patient had a PCN reaction causing immediate rash, facial/tongue/throat swelling, SOB or lightheadedness with hypotension:  Has patient had a PCN reaction causing severe rash involving mucus membranes or skin necrosis: Yes Has patient had a PCN reaction that required hospitalization Yes Has patient had a PCN reaction occurring within the last 10 years: No If all of the above answers are "NO", then may proceed with Cephalosporin use.   . Citrus Hives   Antimicrobials this admission: 6/25 Cefepime >>  6/25 Vanc >>   Levels/dose changes this admission:  Microbiology results:  No orders yet  Thank you for allowing pharmacy to be a part of this patient's care.  Minda Ditto PharmD Pager [phone] 04/04/2016, 2:13 PM

## 2016-04-04 NOTE — ED Notes (Signed)
I attempted to collect labs and was unsuccessful.  Patient has been stuck multiple times.  Nurse will call main lab

## 2016-04-04 NOTE — ED Notes (Signed)
Patient transported to CT 

## 2016-04-04 NOTE — ED Notes (Signed)
Patient here with complaints of increase SOB and swelling to lower extremities. Reports hx of CHF. Takes Lasix. Chest pain upon movement 4/10.

## 2016-04-04 NOTE — ED Notes (Signed)
Bed: WA16 Expected date:  Expected time:  Means of arrival:  Comments: Hold for Res A  

## 2016-04-04 NOTE — ED Notes (Signed)
Respiratory at bedside completing ABG

## 2016-04-04 NOTE — H&P (Addendum)
History and Physical    Paige Marshall [id number] DOB: September 13, 1956 DOA: 04/04/2016  PCP: Kerin Perna, NP  Patient coming from: Home   Chief Complaint: SOB  HPI: Paige Marshall is a 60 y.o. female with medical history significant of CHF systolic EF 45 % ECHO [id number], CKD stage III,cr 1.6, paroxymal A fib , last discharge from hospital 5-31 for AKI who presents complaining of worsening SOB that started the morning of admission. She also notice mild cough, dry. Also report chest pain,midle chest,sharp in quality, intermittent. She is chest pain free. She notice swelling of her feet. Also complaint of right arm pain, elbow and shoulder.   ED Course: PH co2; 48, PO2 49, k at 2.9, cr 1.5, mg 1.6, troponin 0.05, lacticacid 2.3. CT chest : There is pleural based nodular consolidation in right upper lobe laterally measures 2.5 cm. Additional focal parenchymal consolidation in right upper lobe laterally measures 8 mm. Subtle streaky interstitial infiltrate is noted in right middle lobe. Findings may represent multifocal pneumonia. Followup CT scan is recommended in 3-4 weeks following trial of antibiotic therapy to ensure resolution and exclude underlying malignancy. There is 5 mm nodule in left lower lobe laterally attention should be given on follow-up examination.  Review of Systems: As per HPI otherwise 10 point review of systems negative.    Past Medical History  Diagnosis Date  . CHF (congestive heart failure) ([place])   . Hypertension   . Paroxysmal atrial fibrillation (HCC)   . High cholesterol   . Former smoker   . Dyspnea     progressive  . Edema     Lower extremity  . Coronary artery disease   . Asthma   . Pneumonia X 2  . OSA (obstructive sleep apnea)     "never sent CPAP out" (01/14/2015)  . Arthritis     "both knees" (01/14/2015)  . Degenerative joint disease of both lower legs   . Gout     hx in "both knees" (01/14/2015)  . Nonischemic cardiomyopathy [facility])      Past Surgical History  Procedure Laterality Date  . Breath tek h pylori N/A 12/31/2013    Procedure: BREATH TEK H PYLORI;  Surgeon: Gayland Curry, MD;  Location: [facility];  Service: General;  Laterality: N/A;  . Doppler echocardiography      2 D   EF of 45%  . Angiogram/lv (congenital)  2007  . Sleep study  2011  . Stress myocardial dipyridamole perfusion    . Venous duplex ultrasound  2013  . Coronary angioplasty with stent placement  11/04/2005    "1"  . Tubal ligation  1982  . Eye surgery    . Corneal transplant Right ~ 2010  . Left heart catheterization with coronary angiogram N/A 01/20/2015    Procedure: LEFT HEART CATHETERIZATION WITH CORONARY ANGIOGRAM;  Surgeon: Lorretta Harp, MD;  Location: [facility] CATH LAB;  Service: Cardiovascular;  Laterality: N/A;     reports that she quit smoking about 7 years ago. Her smoking use included Cigarettes. She has a 11.55 pack-year smoking history. She has never used smokeless tobacco. She reports that she drinks about 4.8 oz of alcohol per week. She reports that she does not use illicit drugs.  Allergies  Allergen Reactions  . Penicillins Hives, Itching, Swelling and Rash    Has patient had a PCN reaction causing immediate rash, facial/tongue/throat swelling, SOB or lightheadedness with hypotension:  Has patient had a PCN reaction causing severe rash  involving mucus membranes or skin necrosis: Yes Has patient had a PCN reaction that required hospitalization Yes Has patient had a PCN reaction occurring within the last 10 years: No If all of the above answers are "NO", then may proceed with Cephalosporin use.   . Citrus Hives    Family History  Problem Relation Age of Onset  . Heart disease Mother   . Cancer Father     colon  . Hypertension Other      Prior to Admission medications   Medication Sig Start Date End Date Taking? Authorizing Provider  albuterol (PROVENTIL HFA;VENTOLIN HFA) 108 (90 Base) MCG/ACT inhaler Inhale 2  puffs into the lungs every 6 (six) hours as needed for wheezing or shortness of breath. 10/24/15  Yes Deneise Lever, MD  carvedilol (COREG) 25 MG tablet TAKE 2 TABLETS (50 MG TOTAL) BY MOUTH 2 (TWO) TIMES DAILY. 08/20/15  Yes Lorretta Harp, MD  colchicine 0.6 MG tablet Take 1 tablet (0.6 mg total) by mouth daily. Patient taking differently: Take 0.6 mg by mouth See admin instructions. Take 1 tablet by mouth every 3 days 03/13/16  Yes Thurnell Lose, MD  diltiazem (DILACOR XR) 180 MG 24 hr capsule TAKE 1 CAPSULE (180 MG TOTAL) BY MOUTH DAILY. 02/02/16  Yes Lorretta Harp, MD  furosemide (LASIX) 80 MG tablet TAKE 1 TABLET (80 MG TOTAL) BY MOUTH 2 (TWO) TIMES DAILY. 03/25/16  Yes Historical Provider, MD  Multiple Vitamin (MULTIVITAMIN WITH MINERALS) TABS tablet Take 1 tablet by mouth daily.   Yes Historical Provider, MD  Omega-3 Fatty Acids (FISH OIL CONCENTRATE PO) Take 1 capsule by mouth daily.    Yes Historical Provider, MD  predniSONE (DELTASONE) 5 MG tablet Take 5 mg by mouth daily with breakfast.   Yes Historical Provider, MD  propafenone (RYTHMOL) 150 MG tablet TAKE 1 TABLET (150 MG TOTAL) BY MOUTH EVERY 8 (EIGHT) HOURS. 01/05/16  Yes Pixie Casino, MD  oxyCODONE (OXY IR/ROXICODONE) 5 MG immediate release tablet Take 1 tablet (5 mg total) by mouth every 6 (six) hours as needed for moderate pain. Patient not taking: Reported on 04/04/2016 10/12/15   Venetia Maxon Rama, MD  predniSONE (DELTASONE) 5 MG tablet Label  & dispense according to the schedule below. 10 Pills PO for 3 days then, 8 Pills PO for 3 days, 6 Pills PO for 3 days, 4 Pills PO for 3 days, 2 Pills PO for 3 days, 1 Pills PO for 3 days, 1/2 Pill  PO for 3 days then STOP. Total 95 pills. Patient not taking: Reported on 04/04/2016 03/10/16   Thurnell Lose, MD    Physical Exam: Filed Vitals:   04/04/16 1236 04/04/16 1345 04/04/16 1400 04/04/16 1403  BP: 126/78   140/68  Pulse: 64 63 69 66  Temp:      TempSrc:      Resp: 14 19 20 16    Height:      Weight:      SpO2: 100% 95% 99% 98%      Constitutional: NAD, calm, comfortable Filed Vitals:   04/04/16 1236 04/04/16 1345 04/04/16 1400 04/04/16 1403  BP: 126/78   140/68  Pulse: 64 63 69 66  Temp:      TempSrc:      Resp: 14 19 20 16   Height:      Weight:      SpO2: 100% 95% 99% 98%   Eyes: PERRL, lids and conjunctivae normal ENMT: Mucous membranes are moist. Posterior  pharynx clear of any exudate or lesions.Normal dentition.  Neck: normal, supple, no masses, no thyromegaly Respiratory: clear to auscultation bilaterally, no wheezing, no crackles. Normal respiratory effort. No accessory muscle use.  Cardiovascular: Regular rate and rhythm, no murmurs / rubs / gallops. Feet Edema . 2+ pedal pulses. No carotid bruits.  Abdomen: no tenderness, no masses palpated. No hepatosplenomegaly. Bowel sounds positive.  Musculoskeletal: no clubbing / cyanosis. No joint deformity upper and lower extremities. Good ROM, no contractures. Normal muscle tone.  Skin: no rashes, lesions, ulcers. No induration Neurologic: CN 2-12 grossly intact. Sensation intact, DTR normal. Strength 5/5 in all 4.  Psychiatric: Normal judgment and insight. Alert and oriented x 3. Normal mood.     Labs on Admission: I have personally reviewed following labs and imaging studies  CBC:  Recent Labs Lab 04/04/16 1055  WBC 6.8  NEUTROABS 5.4  HGB 9.2*  HCT 28.8*  MCV 88.9  PLT [id number]*   Basic Metabolic Panel:  Recent Labs Lab 04/04/16 1055 04/04/16 1224  NA 140  --   K 2.9*  --   CL 96*  --   CO2 34*  --   GLUCOSE 127*  --   BUN 26*  --   CREATININE 1.56*  --   CALCIUM 8.6*  --   MG  --  1.6*   GFR: Estimated Creatinine Clearance: 62.4 mL/min (by C-G formula based on Cr of 1.56). Liver Function Tests:  Recent Labs Lab 04/04/16 1055  AST 25  ALT 17  ALKPHOS 50  BILITOT 1.4*  PROT 6.9  ALBUMIN 3.8   No results for input(s): LIPASE, AMYLASE in the last 168 hours. No results  for input(s): AMMONIA in the last 168 hours. Coagulation Profile: No results for input(s): INR, PROTIME in the last 168 hours. Cardiac Enzymes:  Recent Labs Lab 04/04/16 1055  TROPONINI 0.05*   BNP (last 3 results) No results for input(s): PROBNP in the last 8760 hours. HbA1C: No results for input(s): HGBA1C in the last 72 hours. CBG: No results for input(s): GLUCAP in the last 168 hours. Lipid Profile: No results for input(s): CHOL, HDL, LDLCALC, TRIG, CHOLHDL, LDLDIRECT in the last 72 hours. Thyroid Function Tests: No results for input(s): TSH, T4TOTAL, FREET4, T3FREE, THYROIDAB in the last 72 hours. Anemia Panel: No results for input(s): VITAMINB12, FOLATE, FERRITIN, TIBC, IRON, RETICCTPCT in the last 72 hours. Urine analysis:    Component Value Date/Time   COLORURINE YELLOW 04/04/2016 1206   APPEARANCEUR HAZY* 04/04/2016 1206   LABSPEC 1.011 04/04/2016 1206   PHURINE 6.5 04/04/2016 1206   GLUCOSEU NEGATIVE 04/04/2016 1206   HGBUR NEGATIVE 04/04/2016 1206   BILIRUBINUR NEGATIVE 04/04/2016 1206   KETONESUR NEGATIVE 04/04/2016 1206   PROTEINUR NEGATIVE 04/04/2016 1206   UROBILINOGEN 1.0 11/16/2009 0554   NITRITE NEGATIVE 04/04/2016 1206   LEUKOCYTESUR NEGATIVE 04/04/2016 1206   Sepsis Labs: !!!!!!!!!!!!!!!!!!!!!!!!!!!!!!!!!!!!!!!!!!!! @LABRCNTIP (procalcitonin:4,lacticidven:4) )No results found for this or any previous visit (from the past 240 hour(s)).   Radiological Exams on Admission: Dg Chest 2 View  04/04/2016  CLINICAL DATA:  Shortness of breath, CHF, hypertension, former smoker EXAM: CHEST  2 VIEW COMPARISON:  03/08/2016 FINDINGS: Enlargement of cardiac silhouette. Atherosclerotic calcification aorta. Mediastinal contours and pulmonary vascularity otherwise normal. New poorly defined RIGHT upper lobe opacity since 03/08/2016 question pneumonia. Lungs otherwise clear. No pneumothorax. IMPRESSION: Enlargement of cardiac silhouette. Question RIGHT upper lobe pneumonia.  Aortic atherosclerosis. Electronically Signed   By: Lavonia Dana M.D.   On: 04/04/2016 10:49  Ct Chest Wo Contrast  04/04/2016  CLINICAL DATA:  Abnormal chest x-ray EXAM: CT CHEST WITHOUT CONTRAST TECHNIQUE: Multidetector CT imaging of the chest was performed following the standard protocol without IV contrast. COMPARISON:  03/08/2016 and 04/04/2016 chest x-ray FINDINGS: Cardiovascular: Cardiomegaly is noted. Atherosclerotic calcifications of coronary arteries. No pericardial effusion. There is no aortic aneurysm. Atherosclerotic calcifications of thoracic aorta. Mediastinum: 8 precarinal lymph node Measures 1 cm short-axis borderline by size criteria. No hilar adenopathy is noted on this unenhanced scan. Lungs/Pleura: As noted on chest x-ray there is pleural based nodular consolidation in right upper lobe measures about 2.5 cm. There is some traction of adjacent pleural fat. This is best seen in coronal image 134. Additional small focus of focal consolidation in right upper lobe laterally measures 8 mm. There is subtle interstitial infiltrate or pneumonitis in right middle lobe. Axial image 71 there is 5 mm nodule in right lower lobe laterally. There is no pulmonary edema. No pneumothorax. Upper Abdomen: The visualized unenhanced upper abdomen shows no adrenal gland mass. Visualized unenhanced liver and spleen is unremarkable. Musculoskeletal: No destructive bony lesions are noted. Mild degenerative changes mid and lower thoracic spine. Sagittal view of the sternum is unremarkable. No rib fractures are noted. IMPRESSION: 1. There is pleural based nodular consolidation in right upper lobe laterally measures 2.5 cm. Additional focal parenchymal consolidation in right upper lobe laterally measures 8 mm. Subtle streaky interstitial infiltrate is noted in right middle lobe. Findings may represent multifocal pneumonia. Followup CT scan is recommended in 3-4 weeks following trial of antibiotic therapy to ensure  resolution and exclude underlying malignancy. There is 5 mm nodule in left lower lobe laterally attention should be given on follow-up examination. 2. Borderline precarinal lymph node.  No hilar adenopathy. 3. Degenerative changes thoracic spine. Electronically Signed   By: Lahoma Crocker M.D.   On: 04/04/2016 13:38    EKG: Independently reviewed. Sinus arrhythmia/   Assessment/Plan Active Problems:   Essential hypertension   PAF (paroxysmal atrial fibrillation) (HCC)   Obstructive sleep apnea   Acute respiratory failure with hypoxia (HCC)   Hypokalemia   Obesity hypoventilation syndrome (HCC)   Pneumonia   Chronic systolic HF (heart failure) (HCC)   Hypomagnesemia  1-Acute Hypoxic Respiratory Failure;  In setting of multifocal PNA.  IV antibiotics.  Nebulizer treatments.  Continue with oral lasix for HF.   2-Multifocal Pneumonia. Continue with treatment for health care associated PNA.  She will need CT chest to document resolution.  IV cefepime and vancomycin/  Sputum, blood culture.  Strep antigen in urine.   3-Gout;continue with uloric and colchicine.  Increase prednisone to 10 mg due to pain right elbow and shoulder.   4-Paroxysmal A fib; Continue with propafenone, coreg and diltiazem.   5-Chronic Systolic HF; appears compensated. Normal BNP.  Continue with lasix 80 mg BID. Monitor renal function/   6-Hypomagnesemia and hypokalemia. Replete IV and orally. Repeat labs in am.   7-Chest pain; suspect related to PNA. But will cycle enzymes. Chest pain free.  8-mild elevated troponin. Cycle enzymes. EKG with no ST elevation.    DVT prophylaxis: lovenox.  Code Status: Full code.  Family Communication: care discussed with patient.  Disposition Plan: Home in 2 to 3 days  Consults called: none Admission status: inpatient, telemetry    Niel Hummer A MD [facility] Pager [phone]  If 7PM-7AM, please contact night-coverage [email or web address] Password  [email or web address]  04/04/2016, 2:16 PM

## 2016-04-05 DIAGNOSIS — I5022 Chronic systolic (congestive) heart failure: Secondary | ICD-10-CM

## 2016-04-05 LAB — CBC
HCT: 27.8 % — ABNORMAL LOW (ref 36.0–46.0)
HEMOGLOBIN: 8.7 g/dL — AB (ref 12.0–15.0)
MCH: 28.8 pg (ref 26.0–34.0)
MCHC: 31.3 g/dL (ref 30.0–36.0)
MCV: 92.1 fL (ref 78.0–100.0)
Platelets: 120 10*3/uL — ABNORMAL LOW (ref 150–400)
RBC: 3.02 MIL/uL — AB (ref 3.87–5.11)
RDW: 16.3 % — ABNORMAL HIGH (ref 11.5–15.5)
WBC: 4.8 10*3/uL (ref 4.0–10.5)

## 2016-04-05 LAB — MAGNESIUM: Magnesium: 1.9 mg/dL (ref 1.7–2.4)

## 2016-04-05 LAB — EXPECTORATED SPUTUM ASSESSMENT W GRAM STAIN, RFLX TO RESP C

## 2016-04-05 LAB — BASIC METABOLIC PANEL
ANION GAP: 8 (ref 5–15)
BUN: 31 mg/dL — ABNORMAL HIGH (ref 6–20)
CHLORIDE: 97 mmol/L — AB (ref 101–111)
CO2: 36 mmol/L — AB (ref 22–32)
Calcium: 8.2 mg/dL — ABNORMAL LOW (ref 8.9–10.3)
Creatinine, Ser: 1.65 mg/dL — ABNORMAL HIGH (ref 0.44–1.00)
GFR calc non Af Amer: 33 mL/min — ABNORMAL LOW (ref >60)
GFR, EST AFRICAN AMERICAN: 39 mL/min — AB (ref >60)
GLUCOSE: 141 mg/dL — AB (ref 65–99)
Potassium: 3 mmol/L — ABNORMAL LOW (ref 3.5–5.1)
Sodium: 141 mmol/L (ref 135–145)

## 2016-04-05 LAB — TROPONIN I: Troponin I: 0.04 ng/mL — ABNORMAL HIGH (ref <0.031)

## 2016-04-05 LAB — HIV ANTIBODY (ROUTINE TESTING W REFLEX): HIV SCREEN 4TH GENERATION: NONREACTIVE

## 2016-04-05 LAB — URIC ACID: Uric Acid, Serum: 9.2 mg/dL — ABNORMAL HIGH (ref 2.3–6.6)

## 2016-04-05 LAB — STREP PNEUMONIAE URINARY ANTIGEN: STREP PNEUMO URINARY ANTIGEN: NEGATIVE

## 2016-04-05 LAB — EXPECTORATED SPUTUM ASSESSMENT W REFEX TO RESP CULTURE

## 2016-04-05 MED ORDER — POTASSIUM CHLORIDE 10 MEQ/100ML IV SOLN
10.0000 meq | INTRAVENOUS | Status: AC
  Administered 2016-04-05 (×4): 10 meq via INTRAVENOUS
  Filled 2016-04-05 (×3): qty 100

## 2016-04-05 MED ORDER — FUROSEMIDE 10 MG/ML IJ SOLN
40.0000 mg | Freq: Two times a day (BID) | INTRAMUSCULAR | Status: DC
  Administered 2016-04-05 – 2016-04-06 (×2): 40 mg via INTRAVENOUS
  Filled 2016-04-05 (×2): qty 4

## 2016-04-05 MED ORDER — POTASSIUM CHLORIDE CRYS ER 20 MEQ PO TBCR
40.0000 meq | EXTENDED_RELEASE_TABLET | Freq: Four times a day (QID) | ORAL | Status: DC
  Administered 2016-04-05: 40 meq via ORAL
  Filled 2016-04-05: qty 2

## 2016-04-05 MED ORDER — LEVOFLOXACIN 750 MG PO TABS
750.0000 mg | ORAL_TABLET | Freq: Every day | ORAL | Status: DC
  Administered 2016-04-05 – 2016-04-06 (×2): 750 mg via ORAL
  Filled 2016-04-05 (×2): qty 1

## 2016-04-05 MED ORDER — COLCHICINE 0.6 MG PO TABS
0.6000 mg | ORAL_TABLET | Freq: Every day | ORAL | Status: DC
  Administered 2016-04-05: 0.6 mg via ORAL
  Filled 2016-04-05: qty 1

## 2016-04-05 MED ORDER — MAGNESIUM SULFATE IN D5W 1-5 GM/100ML-% IV SOLN
1.0000 g | Freq: Once | INTRAVENOUS | Status: AC
  Administered 2016-04-05: 1 g via INTRAVENOUS
  Filled 2016-04-05: qty 100

## 2016-04-05 MED ORDER — METHYLPREDNISOLONE SODIUM SUCC 125 MG IJ SOLR
60.0000 mg | Freq: Every day | INTRAMUSCULAR | Status: DC
Start: 2016-04-05 — End: 2016-04-06
  Administered 2016-04-05: 60 mg via INTRAVENOUS
  Filled 2016-04-05 (×2): qty 2

## 2016-04-05 NOTE — Care Management Note (Signed)
Case Management Note  Patient Details  Name: Paige Marshall MRN: [id number] Date of Birth: 1956-06-16  Subjective/Objective:  60 y/o f admitted w/CHF, PNA. From home. Readmit-5/28-AKI.PT cons-await recc.                  Action/Plan:d/c plan home.   Expected Discharge Date:                  Expected Discharge Plan:  [place]  In-House Referral:     Discharge planning Services  CM Consult  Post Acute Care Choice:    Choice offered to:     DME Arranged:    DME Agency:     HH Arranged:    [place] Agency:     Status of Service:  In process, will continue to follow  If discussed at Long Length of Stay Meetings, dates discussed:    Additional Comments:  Dessa Phi, RN 04/05/2016, 2:06 PM

## 2016-04-05 NOTE — Progress Notes (Addendum)
Pharmacy Antibiotic Follow-up Note  Paige Marshall is a 60 y.o. year-old female admitted on 04/04/2016.  The patient is currently on day 1 of Vancomycin & Cefepime for pneumonia. Orders to change to levofloxacin   Today, 04/05/2016  Renal: Scr trending up, remains on lasix (now IV)  WBC WNL  Assessment/Plan:  Levofloxacin 750mg  PO daily  If renal continues to worsen change to 750mg  q48h  Temp (24hrs), Avg:98.1 F (36.7 C), Min:97.8 F (36.6 C), Max:98.3 F (36.8 C)   Recent Labs Lab 04/04/16 1055 04/05/16 0235  WBC 6.8 4.8     Recent Labs Lab 04/04/16 1055 04/05/16 0235  CREATININE 1.56* 1.65*   Estimated Creatinine Clearance: 59.9 mL/min (by C-G formula based on Cr of 1.65).    Allergies  Allergen Reactions  . Penicillins Hives, Itching, Swelling and Rash    Has patient had a PCN reaction causing immediate rash, facial/tongue/throat swelling, SOB or lightheadedness with hypotension:  Has patient had a PCN reaction causing severe rash involving mucus membranes or skin necrosis: Yes Has patient had a PCN reaction that required hospitalization Yes Has patient had a PCN reaction occurring within the last 10 years: No If all of the above answers are "NO", then may proceed with Cephalosporin use.   . Citrus Hives   Antimicrobials this admission: 6/25 Cefepime >> 6/26 6/25 Vanc >>6/26 6/26 levofloxacin   Levels/dose changes this admission:  Microbiology results:  Microbiology results:  6/25 BCx: pending  6/26: Strep Pneumo Ag: neg  Thank you for allowing pharmacy to be a part of this patient's care.  Doreene Eland, PharmD, BCPS.   Pager: [phone] 04/05/2016 10:38 AM

## 2016-04-05 NOTE — Progress Notes (Signed)
PROGRESS NOTE                                                                                                                                                                                                             Patient Demographics:    Paige Marshall, is a 60 y.o. female, DOB - August 21, 1956, [id number]  Admit date - 04/04/2016   Admitting Physician Elmarie Shiley, MD  Outpatient Primary MD for the patient is EDWARDS, Milford Cage, NP  LOS - 1  Chief Complaint  Patient presents with  . Shortness of Breath  . Leg Swelling       Brief Narrative    Paige Marshall is a 60 y.o. female with medical history significant of ++Gout, CHF systolic EF 45 % ECHO [id number], CKD stage III, cr 1.6, paroxymal A fib , last discharge from hospital 5-31 for AKI who presents complaining of worsening SOB that started the morning of admission. She also notice mild cough, dry. Also report chest pain,midle chest,sharp in quality, intermittent. She is chest pain free. She notice swelling of her feet. Also complaint of right arm pain, elbow and shoulder.   ED Course: PH co2; 48, PO2 49, k at 2.9, cr 1.5, mg 1.6, troponin 0.05, lacticacid 2.3. CT chest : There is pleural based nodular consolidation in right upper lobe laterally measures 2.5 cm. Additional focal parenchymal consolidation in right upper lobe laterally measures 8 mm. Subtle streaky interstitial infiltrate is noted in right middle lobe. Findings may represent multifocal pneumonia. Followup CT scan is recommended in 3-4 weeks following trial of antibiotic therapy to ensure resolution and exclude underlying malignancy. There is 5 mm nodule in left lower lobe laterally attention should be given on follow-up examination.    Subjective:    Paige Marshall today has, No headache, No chest pain, No abdominal pain - No Nausea, No new weakness tingling or numbness, No Cough - SOB.  Diffuse joint pains mostly in the right arm and right elbow.   Assessment  & Plan :      1.Community-acquired pneumonia. CT scan reviewed, monitor cultures, tapered on antibiotics to oral Levaquin, supportive care for now. Will need repeat CT scan chest in 4-6 weeks.  2. Gout flare. Last uric acid was around 15, she is currently on Uniretic and colchicine, dose adjusted, Solu-Medrol added. Monitor.  3. CK D stage IV. Baseline creatinine close to 1.7. At baseline.  4. Chr. systolic heart failure EF 35%. Currently appears compensated, continue Coreg and Lasix.  5. Paroxysmal atrial fibrillation [place] vasc 2 score of 3. She is currently on Rythmol, diltiazem with Coreg. Not on anticoagulation. Would defer to PCP and primary cardiologist.  6. Hypokalemia. Replaced we'll monitor.  7. History of COPD and obesity related hypoventilation syndrome. At baseline, no wheezing no acute issues. Supportive care. Of note she is on home oxygen which will be continued.  8. AOCD - stable.    Code Status : Full  Family Communication  :  None present  Disposition Plan  :  Discharge in 1-2 days  Consults  :  None  Procedures  :    CT chest with a lung nodule and some findings suggestive of atypical infection  DVT Prophylaxis  :  Lovenox   Lab Results  Component Value Date   PLT 120* 04/05/2016    Inpatient Medications  Scheduled Meds: . carvedilol  50 mg Oral BID WC  . colchicine  0.6 mg Oral Daily  . diltiazem  180 mg Oral Daily  . docusate sodium  100 mg Oral BID  . enoxaparin (LOVENOX) injection  80 mg Subcutaneous Q24H  . febuxostat  40 mg Oral Daily  . furosemide  40 mg Intravenous BID  . levofloxacin  750 mg Oral Daily  . magnesium sulfate 1 - 4 g bolus IVPB  1 g Intravenous Once  . methylPREDNISolone (SOLU-MEDROL) injection  60 mg Intravenous Daily  . potassium chloride  10 mEq Intravenous Q1 Hr x 4  . propafenone  150 mg Oral Q8H  . sodium chloride flush  3 mL Intravenous  Q12H   Continuous Infusions:  PRN Meds:.acetaminophen **OR** [DISCONTINUED] acetaminophen, albuterol, nitroGLYCERIN, [DISCONTINUED] ondansetron **OR** ondansetron (ZOFRAN) IV, oxyCODONE  Antibiotics  :    Anti-infectives    Start     Dose/Rate Route Frequency Ordered Stop   04/05/16 1600  vancomycin (VANCOCIN) 1,750 mg in sodium chloride 0.9 % 500 mL IVPB  Status:  Discontinued     1,750 mg 250 mL/hr over 120 Minutes Intravenous Every 24 hours 04/04/16 1501 04/05/16 1023   04/05/16 1200  levofloxacin (LEVAQUIN) tablet 750 mg     750 mg Oral Daily 04/05/16 1042     04/04/16 1600  vancomycin (VANCOCIN) IVPB 1000 mg/200 mL premix     1,000 mg 200 mL/hr over 60 Minutes Intravenous  Once 04/04/16 1500 04/04/16 1645   04/04/16 1430  ceFEPIme (MAXIPIME) 2 g in dextrose 5 % 50 mL IVPB  Status:  Discontinued     2 g 100 mL/hr over 30 Minutes Intravenous Every 8 hours 04/04/16 1403 04/05/16 1023   04/04/16 1430  vancomycin (VANCOCIN) IVPB 1000 mg/200 mL premix     1,000 mg 200 mL/hr over 60 Minutes Intravenous  Once 04/04/16 1411 04/05/16 0746         Objective:   Filed Vitals:   04/04/16 1515 04/04/16 2034 04/05/16 0403 04/05/16 0449  BP: 141/66 125/62 123/45   Pulse: 69 66 60   Temp: 98.3 F (36.8 C) 98.3 F (36.8 C) 97.8 F (36.6 C)   TempSrc: Oral Oral Oral   Resp: 18 18 18    Height: 5\' 8"  (1.727 m)     Weight: 156.264 kg (344 lb 8 oz)   156.536 kg (345 lb 1.6 oz)  SpO2: 97% 99% 97%     Wt Readings from Last  3 Encounters:  04/05/16 156.536 kg (345 lb 1.6 oz)  03/10/16 160.891 kg (354 lb 11.2 oz)  10/24/15 155.039 kg (341 lb 12.8 oz)     Intake/Output Summary (Last 24 hours) at 04/05/16 1044 Last data filed at 04/05/16 0700  Gross per 24 hour  Intake   1320 ml  Output   1400 ml  Net    -80 ml     Physical Exam  Awake Alert, Oriented X 3, No new F.N deficits, Normal affect [place].AT,PERRAL Supple Neck,No JVD, No cervical lymphadenopathy appriciated.  Symmetrical  Chest wall movement, Good air movement bilaterally, CTAB RRR,No Gallops,Rubs or new Murmurs, No Parasternal Heave +ve B.Sounds, Abd Soft, No tenderness, No organomegaly appriciated, No rebound - guarding or rigidity. No Cyanosis, Clubbing or edema, No new Rash or bruise , diffuse joint pains and tenderness, no effusion or redness    Data Review:    CBC  Recent Labs Lab 04/04/16 1055 04/05/16 0235  WBC 6.8 4.8  HGB 9.2* 8.7*  HCT 28.8* 27.8*  PLT 139* 120*  MCV 88.9 92.1  MCH 28.4 28.8  MCHC 31.9 31.3  RDW 16.2* 16.3*  LYMPHSABS 1.0  --   MONOABS 0.3  --   EOSABS 0.1  --   BASOSABS 0.0  --     Chemistries   Recent Labs Lab 04/04/16 1055 04/04/16 1224 04/05/16 0235  NA 140  --  141  K 2.9*  --  3.0*  CL 96*  --  97*  CO2 34*  --  36*  GLUCOSE 127*  --  141*  BUN 26*  --  31*  CREATININE 1.56*  --  1.65*  CALCIUM 8.6*  --  8.2*  MG  --  1.6* 1.9  AST 25  --   --   ALT 17  --   --   ALKPHOS 50  --   --   BILITOT 1.4*  --   --    ------------------------------------------------------------------------------------------------------------------ No results for input(s): CHOL, HDL, LDLCALC, TRIG, CHOLHDL, LDLDIRECT in the last 72 hours.  Lab Results  Component Value Date   HGBA1C  10/02/2009    5.6 (NOTE) The ADA recommends the following therapeutic goal for glycemic control related to Hgb A1c measurement: Goal of therapy: <6.5 Hgb A1c  Reference: American Diabetes Association: Clinical Practice Recommendations 2010, Diabetes Care, 2010, 33: (Suppl  1).   ------------------------------------------------------------------------------------------------------------------ No results for input(s): TSH, T4TOTAL, T3FREE, THYROIDAB in the last 72 hours.  Invalid input(s): FREET3 ------------------------------------------------------------------------------------------------------------------ No results for input(s): VITAMINB12, FOLATE, FERRITIN, TIBC, IRON, RETICCTPCT  in the last 72 hours.  Coagulation profile No results for input(s): INR, PROTIME in the last 168 hours.  No results for input(s): DDIMER in the last 72 hours.  Cardiac Enzymes  Recent Labs Lab 04/04/16 1615 04/04/16 2033 04/05/16 0235  TROPONINI 0.05* 0.05* 0.04*   ------------------------------------------------------------------------------------------------------------------    Component Value Date/Time   BNP 85.7 04/04/2016 1055    Micro Results No results found for this or any previous visit (from the past 240 hour(s)).  Radiology Reports  Dg Chest 2 View  04/04/2016  CLINICAL DATA:  Shortness of breath, CHF, hypertension, former smoker EXAM: CHEST  2 VIEW COMPARISON:  03/08/2016 FINDINGS: Enlargement of cardiac silhouette. Atherosclerotic calcification aorta. Mediastinal contours and pulmonary vascularity otherwise normal. New poorly defined RIGHT upper lobe opacity since 03/08/2016 question pneumonia. Lungs otherwise clear. No pneumothorax. IMPRESSION: Enlargement of cardiac silhouette. Question RIGHT upper lobe pneumonia. Aortic atherosclerosis. Electronically Signed   By: Elta [place]  Thornton Papas M.D.   On: 04/04/2016 10:49    Ct Chest Wo Contrast  04/04/2016  CLINICAL DATA:  Abnormal chest x-ray EXAM: CT CHEST WITHOUT CONTRAST TECHNIQUE: Multidetector CT imaging of the chest was performed following the standard protocol without IV contrast. COMPARISON:  03/08/2016 and 04/04/2016 chest x-ray FINDINGS: Cardiovascular: Cardiomegaly is noted. Atherosclerotic calcifications of coronary arteries. No pericardial effusion. There is no aortic aneurysm. Atherosclerotic calcifications of thoracic aorta. Mediastinum: 8 precarinal lymph node Measures 1 cm short-axis borderline by size criteria. No hilar adenopathy is noted on this unenhanced scan. Lungs/Pleura: As noted on chest x-ray there is pleural based nodular consolidation in right upper lobe measures about 2.5 cm. There is some traction of  adjacent pleural fat. This is best seen in coronal image 134. Additional small focus of focal consolidation in right upper lobe laterally measures 8 mm. There is subtle interstitial infiltrate or pneumonitis in right middle lobe. Axial image 71 there is 5 mm nodule in right lower lobe laterally. There is no pulmonary edema. No pneumothorax. Upper Abdomen: The visualized unenhanced upper abdomen shows no adrenal gland mass. Visualized unenhanced liver and spleen is unremarkable. Musculoskeletal: No destructive bony lesions are noted. Mild degenerative changes mid and lower thoracic spine. Sagittal view of the sternum is unremarkable. No rib fractures are noted. IMPRESSION: 1. There is pleural based nodular consolidation in right upper lobe laterally measures 2.5 cm. Additional focal parenchymal consolidation in right upper lobe laterally measures 8 mm. Subtle streaky interstitial infiltrate is noted in right middle lobe. Findings may represent multifocal pneumonia. Followup CT scan is recommended in 3-4 weeks following trial of antibiotic therapy to ensure resolution and exclude underlying malignancy. There is 5 mm nodule in left lower lobe laterally attention should be given on follow-up examination. 2. Borderline precarinal lymph node.  No hilar adenopathy. 3. Degenerative changes thoracic spine. Electronically Signed   By: Lahoma Crocker M.D.   On: 04/04/2016 13:38    Time Spent in minutes  30   SINGH,PRASHANT K M.D on 04/05/2016 at 10:44 AM  Between 7am to 7pm - Pager - [phone]  After 7pm go to [email or web address] - password [facility]  [facility] -  Office  [phone]

## 2016-04-06 LAB — MAGNESIUM: Magnesium: 2.2 mg/dL (ref 1.7–2.4)

## 2016-04-06 LAB — POTASSIUM: POTASSIUM: 4.1 mmol/L (ref 3.5–5.1)

## 2016-04-06 MED ORDER — PREDNISONE 5 MG PO TABS: ORAL_TABLET | ORAL | Status: DC

## 2016-04-06 MED ORDER — LEVOFLOXACIN 750 MG PO TABS: 750.0000 mg | ORAL_TABLET | Freq: Every day | ORAL | Status: DC

## 2016-04-06 MED ORDER — PREDNISONE 5 MG PO TABS: 5.0000 mg | ORAL_TABLET | Freq: Every day | ORAL | Status: DC

## 2016-04-06 MED ORDER — POTASSIUM CHLORIDE ER 20 MEQ PO TBCR: 20.0000 meq | EXTENDED_RELEASE_TABLET | Freq: Two times a day (BID) | ORAL | Status: DC

## 2016-04-06 MED ORDER — COLCHICINE 0.6 MG PO TABS: 0.6000 mg | ORAL_TABLET | ORAL | Status: DC

## 2016-04-06 MED ORDER — FUROSEMIDE 10 MG/ML IJ SOLN: 80.0000 mg | Freq: Two times a day (BID) | INTRAMUSCULAR | Status: DC

## 2016-04-06 NOTE — Care Management Note (Signed)
Case Management Note  Patient Details  Name: Paige Marshall MRN: [id number] Date of Birth: May 25, 1956  Subjective/Objective:  Patient ordered for HHC-HHRN/PT/aide. PT-recc no f/u. Patient has home 02 uses @ [facility].Spoke to patient about [place] services, & 3 admissions/past 6 months-she states she knows what to do to keep herself healthy-eating right,taking her meds,& f/u w/pcp. She pleasantly declines HHC.MD notified.                  Action/Plan:d/c home no further CM needs.   Expected Discharge Date:                  Expected Discharge Plan:  [place]  In-House Referral:     Discharge planning Services  CM Consult  Post Acute Care Choice:    Choice offered to:     DME Arranged:    DME Agency:     HH Arranged:  Patient Refused [place] Agency:     Status of Service:  Completed, signed off  If discussed at [employer] of Stay Meetings, dates discussed:    Additional Comments:  Dessa Phi, RN 04/06/2016, 12:21 PM

## 2016-04-06 NOTE — Discharge Instructions (Signed)
Follow with Primary MD EDWARDS, MICHELLE P, NP in 7 days   Get CBC, CMP, 2 view Chest X ray checked  by Primary MD next visit ( we routinely change or add medications that cab affect your baseline labs and fluid status, therefore we recommend that your get the mentioned basic workup next visit with your PCP, your PCP may decide not to get them based on their clinical decision)   Activity: As tolerated with Full fall precautions use walker/cane & assistance as needed   Disposition Home     Diet:   Heart Healthy Low Uric acid diet as told.  For Heart failure patients - Check your Weight same time everyday, if you gain over 2 pounds, or you develop in leg swelling, experience more shortness of breath or chest pain, call your Primary MD immediately. Follow Cardiac Low Salt Diet and 1.5 lit/day fluid restriction.   On your next visit with your primary care physician please Get Medicines reviewed and adjusted.   Please request your Prim.MD to go over all Hospital Tests and Procedure/Radiological results at the follow up, please get all Hospital records sent to your Prim MD by signing hospital release before you go home.   If you experience worsening of your admission symptoms, develop shortness of breath, life threatening emergency, suicidal or homicidal thoughts you must seek medical attention immediately by calling 911 or calling your MD immediately  if symptoms less severe.  You Must read complete instructions/literature along with all the possible adverse reactions/side effects for all the Medicines you take and that have been prescribed to you. Take any new Medicines after you have completely understood and accpet all the possible adverse reactions/side effects.   Do not drive, operate heavy machinery, perform activities at heights, swimming or participation in water activities or provide baby sitting services if your were admitted for syncope or siezures until you have seen by Primary MD or  a Neurologist and advised to do so again.  Do not drive when taking Pain medications.    Do not take more than prescribed Pain, Sleep and Anxiety Medications  Special Instructions: If you have smoked or chewed Tobacco  in the last 2 yrs please stop smoking, stop any regular Alcohol  and or any Recreational drug use.  Wear Seat belts while driving.   Please note  You were cared for by a hospitalist during your hospital stay. If you have any questions about your discharge medications or the care you received while you were in the hospital after you are discharged, you can call the unit and asked to speak with the hospitalist on call if the hospitalist that took care of you is not available. Once you are discharged, your primary care physician will handle any further medical issues. Please note that NO REFILLS for any discharge medications will be authorized once you are discharged, as it is imperative that you return to your primary care physician (or establish a relationship with a primary care physician if you do not have one) for your aftercare needs so that they can reassess your need for medications and monitor your lab values.

## 2016-04-06 NOTE — Discharge Summary (Signed)
Paige Marshall [id number] DOB: July 07, 1956 DOA: 04/04/2016  PCP: Kerin Perna, NP  Admit date: 04/04/2016  Discharge date: 04/06/2016  Admitted From: Home  Disposition:  Home  Recommendations for Outpatient Follow-up:   Follow up with PCP in 1-2 weeks Please obtain BMP/CBC in one week, 2 view CXR ( see Discharge instructions)   Please follow up on the following pending results: Final sputum and blood culture results. Needs repeat CT scan kindly review CT report below.  Home Health: Home health PT  Equipment/Devices: Gilford Rile if qualifies  Discharge Condition: Stable  CODE STATUS: Full  Diet recommendation:  Heart Healthy low uric acid   Brief/Interim Summary:  Paige Marshall is a 60 y.o. female with medical history significant of ++Gout, CHF systolic EF 45 % ECHO Q000111Q, CKD stage III, cr 1.6, paroxymal A fib , last discharge from hospital 5-31 for AKI who presents complaining of worsening SOB that started the morning of admission. She also notice mild cough, dry. Also report chest pain,midle chest,sharp in quality, intermittent. She is chest pain free. She notice swelling of her feet. Also complaint of right arm pain, elbow and shoulder.   ED Course: PH co2; 48, PO2 49, k at 2.9, cr 1.5, mg 1.6, troponin 0.05, lacticacid 2.3. CT chest : There is pleural based nodular consolidation in right upper lobe laterally measures 2.5 cm. Additional focal parenchymal consolidation in right upper lobe laterally measures 8 mm. Subtle streaky interstitial infiltrate is noted in right middle lobe. Findings may represent multifocal pneumonia. Followup CT scan is recommended in 3-4 weeks following trial of antibiotic therapy to ensure resolution and exclude underlying malignancy. There is 5 mm nodule in left lower lobe  laterally attention should be given on follow-up examination.   1. Community-acquired pneumonia. CT scan reviewed, final cultures pending, tapered on antibiotics to oral Levaquin, this morning much improved, will be discharged on 5 more days of oral Levaquin, continue home oxygen along with nebulizer treatments. She will require a repeat CT scan chest in 4-6 weeks due to nonspecific findings on present CT scan this admission, have also recommended one-time outpatient pulmonary follow-up.  2. Gout flare. Last uric acid was around 15, she is currently on Uloric and colchicine, dose adjusted, symptoms almost completely resolved after 1 dose of Solu-Medrol, will be placed again on prednisone taper with outpatient PCP follow-up. Her uric acid this admission was 9 which is improved from 15 a few weeks ago.  3. CK D stage IV. Baseline creatinine close to 1.7. At baseline.  4. Chr. systolic heart failure EF 35%. Currently appears compensated, continue Coreg and Lasix. Added potassium supplementation please monitor closely.  5. Paroxysmal atrial fibrillation [place] vasc 2 score of 3. She is currently on Rythmol, diltiazem with Coreg. Not on anticoagulation. Would defer to PCP and primary cardiologist.  6. Hypokalemia. Replaced and stable. It was also present on previous admission, since she is on high-dose of Lasix at home will place her on Wendy milligrams twice a day of oral potassium. Request  PCP to monitor BMP and diuretic dose closely.  7. History of COPD and obesity related hypoventilation syndrome. At baseline, no wheezing no acute issues. Supportive care. Of note she is on home oxygen which will be continued.  8. AOCD - stable.   Discharge Diagnoses:  Active Problems:   Essential hypertension   PAF (paroxysmal atrial fibrillation) (HCC)   Obstructive sleep apnea   Acute respiratory failure with hypoxia (HCC)   Hypokalemia   Obesity hypoventilation syndrome (HCC)   Pneumonia   Chronic  systolic HF (heart failure) (HCC)   Hypomagnesemia   PNA (pneumonia)    Discharge Instructions      Discharge Instructions    Discharge instructions    Complete by:  As directed   Follow with Primary MD EDWARDS, MICHELLE P, NP in 7 days   Get CBC, CMP, 2 view Chest X ray checked  by Primary MD next visit ( we routinely change or add medications that cab affect your baseline labs and fluid status, therefore we recommend that your get the mentioned basic workup next visit with your PCP, your PCP may decide not to get them based on their clinical decision)   Activity: As tolerated with Full fall precautions use walker/cane & assistance as needed   Disposition Home     Diet:   Heart Healthy Low Uric acid diet as told.  For Heart failure patients - Check your Weight same time everyday, if you gain over 2 pounds, or you develop in leg swelling, experience more shortness of breath or chest pain, call your Primary MD immediately. Follow Cardiac Low Salt Diet and 1.5 lit/day fluid restriction.   On your next visit with your primary care physician please Get Medicines reviewed and adjusted.   Please request your Prim.MD to go over all Hospital Tests and Procedure/Radiological results at the follow up, please get all Hospital records sent to your Prim MD by signing hospital release before you go home.   If you experience worsening of your admission symptoms, develop shortness of breath, life threatening emergency, suicidal or homicidal thoughts you must seek medical attention immediately by calling 911 or calling your MD immediately  if symptoms less severe.  You Must read complete instructions/literature along with all the possible adverse reactions/side effects for all the Medicines you take and that have been prescribed to you. Take any new Medicines after you have completely understood and accpet all the possible adverse reactions/side effects.   Do not drive, operate heavy machinery,  perform activities at heights, swimming or participation in water activities or provide baby sitting services if your were admitted for syncope or siezures until you have seen by Primary MD or a Neurologist and advised to do so again.  Do not drive when taking Pain medications.    Do not take more than prescribed Pain, Sleep and Anxiety Medications  Special Instructions: If you have smoked or chewed Tobacco  in the last 2 yrs please stop smoking, stop any regular Alcohol  and or any Recreational drug use.  Wear Seat belts while driving.   Please note  You were cared for by a hospitalist during your hospital stay. If you have any questions about your discharge medications or the care you received while you were in the hospital after you are discharged, you can call the unit and asked to speak with the hospitalist on call if the hospitalist that took care of you is not available. Once you are discharged, your primary care physician will  handle any further medical issues. Please note that NO REFILLS for any discharge medications will be authorized once you are discharged, as it is imperative that you return to your primary care physician (or establish a relationship with a primary care physician if you do not have one) for your aftercare needs so that they can reassess your need for medications and monitor your lab values.     Increase activity slowly    Complete by:  As directed             Medication List    TAKE these medications        albuterol 108 (90 Base) MCG/ACT inhaler  Commonly known as:  PROVENTIL HFA;VENTOLIN HFA  Inhale 2 puffs into the lungs every 6 (six) hours as needed for wheezing or shortness of breath.     carvedilol 25 MG tablet  Commonly known as:  COREG  TAKE 2 TABLETS (50 MG TOTAL) BY MOUTH 2 (TWO) TIMES DAILY.     colchicine 0.6 MG tablet  Take 1 tablet (0.6 mg total) by mouth every other day. Take 1 tablet by mouth every 2 days     diltiazem 180 MG 24 hr  capsule  Commonly known as:  DILACOR XR  TAKE 1 CAPSULE (180 MG TOTAL) BY MOUTH DAILY.     FISH OIL CONCENTRATE PO  Take 1 capsule by mouth daily.     furosemide 80 MG tablet  Commonly known as:  LASIX  TAKE 1 TABLET (80 MG TOTAL) BY MOUTH 2 (TWO) TIMES DAILY.     levofloxacin 750 MG tablet  Commonly known as:  LEVAQUIN  Take 1 tablet (750 mg total) by mouth daily.     multivitamin with minerals Tabs tablet  Take 1 tablet by mouth daily.     oxyCODONE 5 MG immediate release tablet  Commonly known as:  Oxy IR/ROXICODONE  Take 1 tablet (5 mg total) by mouth every 6 (six) hours as needed for moderate pain.     Potassium Chloride ER 20 MEQ Tbcr  Take 20 mEq by mouth 2 (two) times daily.     predniSONE 5 MG tablet  Commonly known as:  DELTASONE  Label  & dispense according to the schedule below. 10 Pills PO for 3 days then, 8 Pills PO for 3 days, 6 Pills PO for 3 days, 4 Pills PO for 3 days, 2 Pills PO for 3 days, then resume your 5mg  daily dose. Total 95 pills.     predniSONE 5 MG tablet  Commonly known as:  DELTASONE  Take 1 tablet (5 mg total) by mouth daily with breakfast.  Start taking on:  04/19/2016     propafenone 150 MG tablet  Commonly known as:  RYTHMOL  TAKE 1 TABLET (150 MG TOTAL) BY MOUTH EVERY 8 (EIGHT) HOURS.       Follow-up Information    Follow up with EDWARDS, MICHELLE P, NP. Schedule an appointment as soon as possible for a visit in 1 week.   Specialty:  Internal Medicine   Contact information:   [place] [address] [phone]       Follow up with [facility], MD. Schedule an appointment as soon as possible for a visit in 1 week.   Specialty:  Pulmonary Disease   Why:  Lung Nodule   Contact information:   [place] [address] [phone]      Allergies  Allergen Reactions  . Penicillins Hives, Itching, Swelling  and Rash    Has patient had a PCN reaction causing immediate rash, facial/tongue/throat  swelling, SOB or lightheadedness with hypotension:  Has patient had a PCN reaction causing severe rash involving mucus membranes or skin necrosis: Yes Has patient had a PCN reaction that required hospitalization Yes Has patient had a PCN reaction occurring within the last 10 years: No If all of the above answers are "NO", then may proceed with Cephalosporin use.   . Citrus Hives    Consultations:  None   Major procedures and Radiology Reports - PLEASE review detailed and final reports for all details, in brief -     Ct Abdomen Pelvis Wo Contrast  03/08/2016  CLINICAL DATA:  Acute renal failure EXAM: CT ABDOMEN AND PELVIS WITHOUT CONTRAST TECHNIQUE: Multidetector CT imaging of the abdomen and pelvis was performed following the standard protocol without IV contrast. COMPARISON:  None. FINDINGS: Lower chest:  Mild cardiomegaly.  Clear lung bases. Hepatobiliary: Normal liver.  Normal gallbladder. Pancreas: Normal. Spleen: Normal. Adrenals/Urinary Tract: Normal adrenal glands. Normal kidneys. No urolithiasis or obstructive uropathy. Normal decompressed bladder. Stomach/Bowel: No bowel wall thickening or dilatation. No pneumatosis, pneumoperitoneum or portal venous gas. No abdominal or pelvic free fluid. Vascular/Lymphatic: Normal caliber abdominal aorta with mild atherosclerosis. No lymphadenopathy. Reproductive: Normal uterus.  No adnexal mass. Other: No fluid collection or hematoma. Musculoskeletal: Degenerative facet arthropathy throughout the lumbar spine. No aggressive osseous lesion. Mild degenerative changes of the right sacroiliac joint. IMPRESSION: 1. No acute abdominal or pelvic pathology. 2. Mild stable cardiomegaly. Electronically Signed   By: Kathreen Devoid   On: 03/08/2016 09:36   Dg Chest 2 View  04/04/2016  CLINICAL DATA:  Shortness of breath, CHF, hypertension, former smoker EXAM: CHEST  2 VIEW COMPARISON:  03/08/2016 FINDINGS: Enlargement of cardiac silhouette. Atherosclerotic  calcification aorta. Mediastinal contours and pulmonary vascularity otherwise normal. New poorly defined RIGHT upper lobe opacity since 03/08/2016 question pneumonia. Lungs otherwise clear. No pneumothorax. IMPRESSION: Enlargement of cardiac silhouette. Question RIGHT upper lobe pneumonia. Aortic atherosclerosis. Electronically Signed   By: Lavonia Dana M.D.   On: 04/04/2016 10:49   Dg Chest 2 View  03/08/2016  CLINICAL DATA:  Acute onset of shortness of breath, and bilateral hand and foot swelling. Initial encounter. EXAM: CHEST  2 VIEW COMPARISON:  Chest radiograph performed 10/10/2015 FINDINGS: The lungs are well-aerated. Pulmonary vascularity is at the upper limits of normal. There is no evidence of focal opacification, pleural effusion or pneumothorax. The heart is enlarged.  No acute osseous abnormalities are seen. IMPRESSION: Cardiomegaly.  Lungs remain grossly clear. Electronically Signed   By: Garald Balding M.D.   On: 03/08/2016 00:52   Ct Chest Wo Contrast  04/04/2016  CLINICAL DATA:  Abnormal chest x-ray EXAM: CT CHEST WITHOUT CONTRAST TECHNIQUE: Multidetector CT imaging of the chest was performed following the standard protocol without IV contrast. COMPARISON:  03/08/2016 and 04/04/2016 chest x-ray FINDINGS: Cardiovascular: Cardiomegaly is noted. Atherosclerotic calcifications of coronary arteries. No pericardial effusion. There is no aortic aneurysm. Atherosclerotic calcifications of thoracic aorta. Mediastinum: 8 precarinal lymph node Measures 1 cm short-axis borderline by size criteria. No hilar adenopathy is noted on this unenhanced scan. Lungs/Pleura: As noted on chest x-ray there is pleural based nodular consolidation in right upper lobe measures about 2.5 cm. There is some traction of adjacent pleural fat. This is best seen in coronal image 134. Additional small focus of focal consolidation in right upper lobe laterally measures 8 mm. There is subtle interstitial infiltrate or pneumonitis  in  right middle lobe. Axial image 71 there is 5 mm nodule in right lower lobe laterally. There is no pulmonary edema. No pneumothorax. Upper Abdomen: The visualized unenhanced upper abdomen shows no adrenal gland mass. Visualized unenhanced liver and spleen is unremarkable. Musculoskeletal: No destructive bony lesions are noted. Mild degenerative changes mid and lower thoracic spine. Sagittal view of the sternum is unremarkable. No rib fractures are noted. IMPRESSION: 1. There is pleural based nodular consolidation in right upper lobe laterally measures 2.5 cm. Additional focal parenchymal consolidation in right upper lobe laterally measures 8 mm. Subtle streaky interstitial infiltrate is noted in right middle lobe. Findings may represent multifocal pneumonia. Followup CT scan is recommended in 3-4 weeks following trial of antibiotic therapy to ensure resolution and exclude underlying malignancy. There is 5 mm nodule in left lower lobe laterally attention should be given on follow-up examination. 2. Borderline precarinal lymph node.  No hilar adenopathy. 3. Degenerative changes thoracic spine. Electronically Signed   By: Lahoma Crocker M.D.   On: 04/04/2016 13:38    Micro Results      Recent Results (from the past 240 hour(s))  Culture, blood (routine x 2) Call MD if unable to obtain prior to antibiotics being given     Status: None (Preliminary result)   Collection Time: 04/04/16  3:41 PM  Result Value Ref Range Status   Specimen Description BLOOD BLOOD RIGHT HAND  Final   Special Requests IN PEDIATRIC BOTTLE 3CC  Final   Culture   Final    NO GROWTH < 24 HOURS Performed at [facility]    Report Status PENDING  Incomplete  Culture, blood (routine x 2) Call MD if unable to obtain prior to antibiotics being given     Status: None (Preliminary result)   Collection Time: 04/04/16  3:41 PM  Result Value Ref Range Status   Specimen Description BLOOD BLOOD RIGHT HAND  Final   Special Requests IN  PEDIATRIC BOTTLE [place]  Final   Culture   Final    NO GROWTH < 24 HOURS Performed at [facility]    Report Status PENDING  Incomplete  Culture, sputum-assessment     Status: None   Collection Time: 04/05/16  2:37 PM  Result Value Ref Range Status   Specimen Description SPUTUM  Final   Special Requests NONE  Final   Sputum evaluation   Final    THIS SPECIMEN IS ACCEPTABLE. RESPIRATORY CULTURE REPORT TO FOLLOW.   Report Status 04/05/2016 FINAL  Final  Culture, respiratory (NON-Expectorated)     Status: None (Preliminary result)   Collection Time: 04/05/16  2:37 PM  Result Value Ref Range Status   Specimen Description SPUTUM  Final   Special Requests NONE  Final   Gram Stain   Final    FEW WBC PRESENT,BOTH PMN AND MONONUCLEAR FEW GRAM VARIABLE ROD RARE GRAM POSITIVE COCCI IN PAIRS RARE YEAST Performed at [facility]    Culture PENDING  Incomplete   Report Status PENDING  Incomplete    Today   Subjective    Paige Marshall today has no headache,no chest abdominal pain,no new weakness tingling or numbness, feels much better wants to go home today.    Objective   Blood pressure 152/85, pulse 64, temperature 97.8 F (36.6 C), temperature source Oral, resp. rate 20, height 5\' 8"  (1.727 m), weight 159.258 kg (351 lb 1.6 oz), last menstrual period 09/07/2011, SpO2 100 %.   Intake/Output Summary (Last 24 hours)  at 04/06/16 1046 Last data filed at 04/06/16 0506  Gross per 24 hour  Intake    180 ml  Output   1250 ml  Net  -1070 ml    Exam Awake Alert, Oriented x 3, No new F.N deficits, Normal affect [place].AT,PERRAL Supple Neck,No JVD, No cervical lymphadenopathy appriciated.  Symmetrical Chest wall movement, Good air movement bilaterally, CTAB RRR,No Gallops,Rubs or new Murmurs, No Parasternal Heave +ve B.Sounds, Abd Soft, Non tender, No organomegaly appriciated, No rebound -guarding or rigidity. No Cyanosis, Clubbing or edema, No new Rash or bruise   Data  Review   CBC w Diff:  Lab Results  Component Value Date   WBC 4.8 04/05/2016   HGB 8.7* 04/05/2016   HCT 27.8* 04/05/2016   PLT 120* 04/05/2016   LYMPHOPCT 15 04/04/2016   MONOPCT 5 04/04/2016   EOSPCT 1 04/04/2016   BASOPCT 0 04/04/2016    CMP:  Lab Results  Component Value Date   NA 141 04/05/2016   K 4.1 04/06/2016   CL 97* 04/05/2016   CO2 36* 04/05/2016   BUN 31* 04/05/2016   CREATININE 1.65* 04/05/2016   CREATININE 1.31* 11/19/2013   PROT 6.9 04/04/2016   ALBUMIN 3.8 04/04/2016   BILITOT 1.4* 04/04/2016   ALKPHOS 50 04/04/2016   AST 25 04/04/2016   ALT 17 04/04/2016  .   Total Time in preparing paper work, data evaluation and todays exam - 35 minutes  Thurnell Lose M.D on 04/06/2016 at 10:46 AM  [facility]   Office  [phone]

## 2016-04-06 NOTE — Evaluation (Signed)
Physical Therapy Evaluation Patient Details Name: Paige Marshall MRN: [id number] DOB: 04/14/1956 Today's Date: 04/06/2016   History of Present Illness  Paige Marshall is a 60 y.o. female with medical history significant of CHF systolic EF 45 % ECHO Q000111Q, CKD stage III, paroxymal A fib , last discharge from hospital 5-31 for AKI who presents complaining of worsening SOB that started the morning of admission. Additional focal parenchymal  Clinical Impression  The patient is mobilizing well. Ready for DC with no further PT needs.     Follow Up Recommendations No PT follow up    Equipment Recommendations  None recommended by PT    Recommendations for Other Services       Precautions / Restrictions Precautions Precaution Comments: monitor sats Restrictions Weight Bearing Restrictions: No      Mobility  Bed Mobility               General bed mobility comments: sitting up  Transfers Overall transfer level: Needs assistance Equipment used: Rolling walker (2 wheeled) Transfers: Sit to/from Stand Sit to Stand: Supervision            Ambulation/Gait Ambulation/Gait assistance: Supervision Ambulation Distance (Feet): 60 Feet Assistive device: Rolling walker (2 wheeled) Gait Pattern/deviations: Step-to pattern;Step-through pattern     General Gait Details: tolerated well. Dats 87% on RA. replaced on 2 liters with stas to 97%  Stairs            Wheelchair Mobility    Modified Rankin (Stroke Patients Only)       Balance                                             Pertinent Vitals/Pain Pain Assessment: No/denies pain    Home Living Family/patient expects to be discharged to:: Private residence Living Arrangements: Alone Available Help at Discharge: Family;Available 24 hours/day Type of Home: House Home Access: Stairs to enter Entrance Stairs-Rails: Psychiatric nurse of Steps: 4 Home Layout: One  level Home Equipment: Walker - 2 wheels;Cane - single point;Electric scooter      Prior Function                 Hand Dominance        Extremity/Trunk Assessment   Upper Extremity Assessment: Overall WFL for tasks assessed           Lower Extremity Assessment: Overall WFL for tasks assessed      Cervical / Trunk Assessment: Normal  Communication      Cognition Arousal/Alertness: Awake/alert Behavior During Therapy: WFL for tasks assessed/performed Overall Cognitive Status: Within Functional Limits for tasks assessed                      General Comments      Exercises        Assessment/Plan    PT Assessment Patent does not need any further PT services  PT Diagnosis Generalized weakness   PT Problem List    PT Treatment Interventions     PT Goals (Current goals can be found in the Care Plan section) Acute Rehab PT Goals Patient Stated Goal: go home PT Goal Formulation: All assessment and education complete, DC therapy    Frequency     Barriers to discharge        Co-evaluation  End of Session   Activity Tolerance: Patient tolerated treatment well Patient left: in bed (on edge) Nurse Communication: Mobility status         Time: [id number] PT Time Calculation (min) (ACUTE ONLY): 19 min   Charges:   PT Evaluation $PT Eval Low Complexity: 1 Procedure     PT G CodesClaretha Marshall 04/06/2016, 10:50 AM Paige Marshall PT [phone]

## 2016-04-07 LAB — CULTURE, RESPIRATORY: CULTURE: NORMAL

## 2016-04-09 LAB — CULTURE, BLOOD (ROUTINE X 2)
CULTURE: NO GROWTH
Culture: NO GROWTH

## 2016-05-07 ENCOUNTER — Ambulatory Visit (INDEPENDENT_AMBULATORY_CARE_PROVIDER_SITE_OTHER): Payer: Medicare Other | Admitting: Cardiovascular Disease

## 2016-05-07 ENCOUNTER — Encounter: Payer: Self-pay | Admitting: Cardiovascular Disease

## 2016-05-07 DIAGNOSIS — I480 Paroxysmal atrial fibrillation: Secondary | ICD-10-CM

## 2016-05-07 DIAGNOSIS — I5041 Acute combined systolic (congestive) and diastolic (congestive) heart failure: Secondary | ICD-10-CM

## 2016-05-07 DIAGNOSIS — I10 Essential (primary) hypertension: Secondary | ICD-10-CM

## 2016-05-07 DIAGNOSIS — E785 Hyperlipidemia, unspecified: Secondary | ICD-10-CM

## 2016-05-07 MED ORDER — DILTIAZEM HCL ER 180 MG PO CP24: ORAL_CAPSULE | ORAL | 3 refills | Status: DC

## 2016-05-07 MED ORDER — CARVEDILOL 25 MG PO TABS: ORAL_TABLET | ORAL | 3 refills | Status: DC

## 2016-05-07 MED ORDER — POTASSIUM CHLORIDE ER 20 MEQ PO TBCR: 20.0000 meq | EXTENDED_RELEASE_TABLET | Freq: Two times a day (BID) | ORAL | 3 refills | Status: DC

## 2016-05-07 MED ORDER — FUROSEMIDE 80 MG PO TABS: ORAL_TABLET | ORAL | 3 refills | Status: DC

## 2016-05-07 NOTE — Patient Instructions (Signed)

## 2016-05-07 NOTE — Assessment & Plan Note (Signed)
History of hypertension blood pressure measurements at 139/76. She is on carvedilol, diltiazem. Continue current meds at current dosing

## 2016-05-07 NOTE — Progress Notes (Signed)
05/07/2016 Paige Marshall   01-Aug-1956  [id number]  Primary Physician EDWARDS, Milford Cage, NP Primary Cardiologist: Lorretta Harp MD Renae Gloss  HPI:  The patient is a 60 year old morbidly overweight, widowed [place] American female, mother of 2, grandmother to 3 grandchildren, who I last saw in the office 04/15/15. She work third shift as a Animal nutritionist at [employer] but a sense out of work because of inability to ambulate.. Her other problems include hypertension and discontinued tobacco abuse 1 year ago, as well as paroxysmal atrial fibrillation, maintaining sinus rhythm on Rythmol. She has obstructive sleep apnea, on CPAP. She has complained of progressive dyspnea and chest pain as well as lower extremity swelling. She was catheterized November 04, 2005, revealing normal coronary arteries and low-normal LV function, suggesting nonischemic cardiomyopathy secondary to uncontrolled hypertension. I began her on Zaroxolyn 2.5 mg daily every day, which resulted in diuresis and improvement in her symptoms of shortness of breath and swelling. A 2D echocardiogram revealed an EF of 45%, mildly decreased compared to prior echocardiogram, but consistent with her LV function by angiography in 2007. She is aware of salt restriction  She was admitted to [facility] April of this year because of systolic and diastolic heart failure. I performed cardiac catheterization on her 01/20/15 once again revealing normal coronary arteries with an EF of 45% elevated LVEDP. She is in need of bilateral knee replacements but has not pursued this because of her weight. She was recently hospitalized with pneumonia and congestive heart failure. She was diuresed. She is aware of salt restriction. She walks minimally with beta blocker.    Current Outpatient Prescriptions  Medication Sig Dispense Refill  . albuterol (PROVENTIL HFA;VENTOLIN HFA) 108 (90 Base) MCG/ACT inhaler Inhale 2 puffs into the lungs  every 6 (six) hours as needed for wheezing or shortness of breath. 1 Inhaler 11  . carvedilol (COREG) 25 MG tablet TAKE 2 TABLETS (50 MG TOTAL) BY MOUTH 2 (TWO) TIMES DAILY. 120 tablet 8  . colchicine 0.6 MG tablet Take 1 tablet (0.6 mg total) by mouth every other day. Take 1 tablet by mouth every 2 days 30 tablet 0  . diltiazem (DILACOR XR) 180 MG 24 hr capsule TAKE 1 CAPSULE (180 MG TOTAL) BY MOUTH DAILY. 30 capsule 6  . furosemide (LASIX) 80 MG tablet TAKE 1 TABLET (80 MG TOTAL) BY MOUTH 2 (TWO) TIMES DAILY.  2  . levofloxacin (LEVAQUIN) 750 MG tablet Take 1 tablet (750 mg total) by mouth daily. 5 tablet 0  . Multiple Vitamin (MULTIVITAMIN WITH MINERALS) TABS tablet Take 1 tablet by mouth daily.    . Omega-3 Fatty Acids (FISH OIL CONCENTRATE PO) Take 1 capsule by mouth daily.     [name] oxyCODONE (OXY IR/ROXICODONE) 5 MG immediate release tablet Take 1 tablet (5 mg total) by mouth every 6 (six) hours as needed for moderate pain. 15 tablet 0  . Potassium Chloride ER 20 MEQ TBCR Take 20 mEq by mouth 2 (two) times daily. 180 tablet 3  . predniSONE (DELTASONE) 5 MG tablet Label  & dispense according to the schedule below. 10 Pills PO for 3 days then, 8 Pills PO for 3 days, 6 Pills PO for 3 days, 4 Pills PO for 3 days, 2 Pills PO for 3 days, then resume your 5mg  daily dose. Total 95 pills. 95 tablet 0  . predniSONE (DELTASONE) 5 MG tablet Take 1 tablet (5 mg total) by mouth daily with breakfast.    .  propafenone (RYTHMOL) 150 MG tablet TAKE 1 TABLET (150 MG TOTAL) BY MOUTH EVERY 8 (EIGHT) HOURS. 90 tablet 6   No current facility-administered medications for this visit.     Allergies  Allergen Reactions  . Penicillins Hives, Itching, Swelling and Rash    Has patient had a PCN reaction causing immediate rash, facial/tongue/throat swelling, SOB or lightheadedness with hypotension:  Has patient had a PCN reaction causing severe rash involving mucus membranes or skin necrosis: Yes Has patient had a PCN  reaction that required hospitalization Yes Has patient had a PCN reaction occurring within the last 10 years: No If all of the above answers are "NO", then may proceed with Cephalosporin use.   . Citrus Hives    Social History   Social History  . Marital status: Single    Spouse name: N/A  . Number of children: N/A  . Years of education: N/A   Occupational History  . Not on file.   Social History Main Topics  . Smoking status: Former Smoker    Packs/day: 0.33    Years: 35.00    Types: Cigarettes    Quit date: 10/11/2008  . Smokeless tobacco: Never Used  . Alcohol use 4.8 oz/week    8 Cans of beer per week     Comment: 01/14/2015 "~ 4, 24oz cans beer/wk"  . Drug use: No  . Sexual activity: No   Other Topics Concern  . Not on file   Social History Narrative  . No narrative on file     Review of Systems: General: negative for chills, fever, night sweats or weight changes.  Cardiovascular: negative for chest pain, dyspnea on exertion, edema, orthopnea, palpitations, paroxysmal nocturnal dyspnea or shortness of breath Dermatological: negative for rash Respiratory: negative for cough or wheezing Urologic: negative for hematuria Abdominal: negative for nausea, vomiting, diarrhea, bright red blood per rectum, melena, or hematemesis Neurologic: negative for visual changes, syncope, or dizziness All other systems reviewed and are otherwise negative except as noted above.    Blood pressure 139/76, pulse 65, height 5' 7.5" (1.715 m), weight (!) 342 lb (155.1 kg), last menstrual period 09/07/2011.  General appearance: alert and no distress Neck: no adenopathy, no carotid bruit, no JVD, supple, symmetrical, trachea midline and thyroid not enlarged, symmetric, no tenderness/mass/nodules Lungs: clear to auscultation bilaterally Heart: regular rate and rhythm, S1, S2 normal, no murmur, click, rub or gallop Extremities: 1+ pitting edema bilaterally  EKG not performed  today  ASSESSMENT AND PLAN:   Dyslipidemia History of hyperlipidemia not on statin therapy followed by her PCP.  Essential hypertension History of hypertension blood pressure measurements at 139/76. She is on carvedilol, diltiazem. Continue current meds at current dosing  PAF (paroxysmal atrial fibrillation) (HCC) History of paroxysmal atrial fibrillation on Rythmol maintaining sinus rhythm. She has been on oral anticoagulant the past but firstly had excessive bleeding and this was discontinued.  Acute combined systolic and diastolic congestive heart failure (HCC) History of nonischemic cardiomyopathy by cardiac catheterization which I performed 01/20/15. Her EF is in the 40% range. She is on appropriate medications.      Lorretta Harp MD FACP,FACC,FAHA, [facility] 05/07/2016 2:31 PM

## 2016-05-07 NOTE — Assessment & Plan Note (Signed)
History of nonischemic cardiomyopathy by cardiac catheterization which I performed 01/20/15. Her EF is in the 40% range. She is on appropriate medications.

## 2016-05-07 NOTE — Assessment & Plan Note (Signed)
History of hyperlipidemia not on statin therapy followed by her PCP 

## 2016-05-07 NOTE — Assessment & Plan Note (Signed)
History of paroxysmal atrial fibrillation on Rythmol maintaining sinus rhythm. She has been on oral anticoagulant the past but firstly had excessive bleeding and this was discontinued.

## 2016-05-24 DIAGNOSIS — M1A09X1 Idiopathic chronic gout, multiple sites, with tophus (tophi): Secondary | ICD-10-CM | POA: Diagnosis not present

## 2016-05-24 DIAGNOSIS — Z79899 Other long term (current) drug therapy: Secondary | ICD-10-CM | POA: Diagnosis not present

## 2016-05-24 DIAGNOSIS — M2550 Pain in unspecified joint: Secondary | ICD-10-CM | POA: Diagnosis not present

## 2016-08-17 ENCOUNTER — Other Ambulatory Visit: Payer: Self-pay | Admitting: Internal Medicine

## 2016-08-17 DIAGNOSIS — M150 Primary generalized (osteo)arthritis: Secondary | ICD-10-CM | POA: Diagnosis not present

## 2016-08-17 DIAGNOSIS — M2550 Pain in unspecified joint: Secondary | ICD-10-CM | POA: Diagnosis not present

## 2016-08-17 DIAGNOSIS — Z79899 Other long term (current) drug therapy: Secondary | ICD-10-CM | POA: Diagnosis not present

## 2016-08-17 DIAGNOSIS — M1A09X1 Idiopathic chronic gout, multiple sites, with tophus (tophi): Secondary | ICD-10-CM | POA: Diagnosis not present

## 2016-08-28 ENCOUNTER — Emergency Department (HOSPITAL_COMMUNITY): Payer: Medicare Other

## 2016-08-28 ENCOUNTER — Inpatient Hospital Stay (HOSPITAL_COMMUNITY)
Admission: EM | Admit: 2016-08-28 | Discharge: 2016-09-01 | DRG: 291 | Disposition: A | Discharge status: Home Health | Payer: Medicare Other | Attending: Internal Medicine | Admitting: Internal Medicine

## 2016-08-28 ENCOUNTER — Encounter (HOSPITAL_COMMUNITY): Payer: Self-pay | Admitting: *Deleted

## 2016-08-28 DIAGNOSIS — Z23 Encounter for immunization: Secondary | ICD-10-CM

## 2016-08-28 DIAGNOSIS — I5033 Acute on chronic diastolic (congestive) heart failure: Secondary | ICD-10-CM | POA: Diagnosis present

## 2016-08-28 DIAGNOSIS — I493 Ventricular premature depolarization: Secondary | ICD-10-CM | POA: Diagnosis present

## 2016-08-28 DIAGNOSIS — G4733 Obstructive sleep apnea (adult) (pediatric): Secondary | ICD-10-CM | POA: Diagnosis present

## 2016-08-28 DIAGNOSIS — I2729 Other secondary pulmonary hypertension: Secondary | ICD-10-CM | POA: Diagnosis present

## 2016-08-28 DIAGNOSIS — Z880 Allergy status to penicillin: Secondary | ICD-10-CM

## 2016-08-28 DIAGNOSIS — Z8249 Family history of ischemic heart disease and other diseases of the circulatory system: Secondary | ICD-10-CM

## 2016-08-28 DIAGNOSIS — W07XXXA Fall from chair, initial encounter: Secondary | ICD-10-CM | POA: Diagnosis present

## 2016-08-28 DIAGNOSIS — R55 Syncope and collapse: Secondary | ICD-10-CM

## 2016-08-28 DIAGNOSIS — S99919A Unspecified injury of unspecified ankle, initial encounter: Secondary | ICD-10-CM

## 2016-08-28 DIAGNOSIS — Y92009 Unspecified place in unspecified non-institutional (private) residence as the place of occurrence of the external cause: Secondary | ICD-10-CM

## 2016-08-28 DIAGNOSIS — I2510 Atherosclerotic heart disease of native coronary artery without angina pectoris: Secondary | ICD-10-CM | POA: Diagnosis present

## 2016-08-28 DIAGNOSIS — N184 Chronic kidney disease, stage 4 (severe): Secondary | ICD-10-CM | POA: Diagnosis present

## 2016-08-28 DIAGNOSIS — N179 Acute kidney failure, unspecified: Secondary | ICD-10-CM | POA: Diagnosis present

## 2016-08-28 DIAGNOSIS — Z955 Presence of coronary angioplasty implant and graft: Secondary | ICD-10-CM

## 2016-08-28 DIAGNOSIS — Z800 Family history of malignant neoplasm of digestive organs: Secondary | ICD-10-CM

## 2016-08-28 DIAGNOSIS — R410 Disorientation, unspecified: Secondary | ICD-10-CM

## 2016-08-28 DIAGNOSIS — E7800 Pure hypercholesterolemia, unspecified: Secondary | ICD-10-CM | POA: Diagnosis present

## 2016-08-28 DIAGNOSIS — M25562 Pain in left knee: Secondary | ICD-10-CM | POA: Diagnosis not present

## 2016-08-28 DIAGNOSIS — I480 Paroxysmal atrial fibrillation: Secondary | ICD-10-CM | POA: Diagnosis present

## 2016-08-28 DIAGNOSIS — M069 Rheumatoid arthritis, unspecified: Secondary | ICD-10-CM | POA: Diagnosis present

## 2016-08-28 DIAGNOSIS — I130 Hypertensive heart and chronic kidney disease with heart failure and stage 1 through stage 4 chronic kidney disease, or unspecified chronic kidney disease: Principal | ICD-10-CM | POA: Diagnosis present

## 2016-08-28 DIAGNOSIS — J9611 Chronic respiratory failure with hypoxia: Secondary | ICD-10-CM | POA: Diagnosis present

## 2016-08-28 DIAGNOSIS — J9621 Acute and chronic respiratory failure with hypoxia: Secondary | ICD-10-CM | POA: Diagnosis present

## 2016-08-28 DIAGNOSIS — M109 Gout, unspecified: Secondary | ICD-10-CM | POA: Diagnosis present

## 2016-08-28 DIAGNOSIS — R404 Transient alteration of awareness: Secondary | ICD-10-CM | POA: Diagnosis not present

## 2016-08-28 DIAGNOSIS — S82832A Other fracture of upper and lower end of left fibula, initial encounter for closed fracture: Secondary | ICD-10-CM | POA: Diagnosis present

## 2016-08-28 DIAGNOSIS — I272 Pulmonary hypertension, unspecified: Secondary | ICD-10-CM

## 2016-08-28 DIAGNOSIS — R7989 Other specified abnormal findings of blood chemistry: Secondary | ICD-10-CM

## 2016-08-28 DIAGNOSIS — J449 Chronic obstructive pulmonary disease, unspecified: Secondary | ICD-10-CM | POA: Diagnosis present

## 2016-08-28 DIAGNOSIS — Z9119 Patient's noncompliance with other medical treatment and regimen: Secondary | ICD-10-CM

## 2016-08-28 DIAGNOSIS — R791 Abnormal coagulation profile: Secondary | ICD-10-CM | POA: Diagnosis present

## 2016-08-28 DIAGNOSIS — M79605 Pain in left leg: Secondary | ICD-10-CM | POA: Diagnosis not present

## 2016-08-28 DIAGNOSIS — I428 Other cardiomyopathies: Secondary | ICD-10-CM

## 2016-08-28 DIAGNOSIS — J9601 Acute respiratory failure with hypoxia: Secondary | ICD-10-CM

## 2016-08-28 DIAGNOSIS — I50810 Right heart failure, unspecified: Secondary | ICD-10-CM

## 2016-08-28 DIAGNOSIS — W19XXXA Unspecified fall, initial encounter: Secondary | ICD-10-CM

## 2016-08-28 DIAGNOSIS — N189 Chronic kidney disease, unspecified: Secondary | ICD-10-CM

## 2016-08-28 DIAGNOSIS — I5043 Acute on chronic combined systolic (congestive) and diastolic (congestive) heart failure: Secondary | ICD-10-CM

## 2016-08-28 DIAGNOSIS — R0600 Dyspnea, unspecified: Secondary | ICD-10-CM | POA: Diagnosis not present

## 2016-08-28 DIAGNOSIS — Z6841 Body Mass Index (BMI) 40.0 and over, adult: Secondary | ICD-10-CM

## 2016-08-28 DIAGNOSIS — I429 Cardiomyopathy, unspecified: Secondary | ICD-10-CM | POA: Diagnosis present

## 2016-08-28 DIAGNOSIS — E662 Morbid (severe) obesity with alveolar hypoventilation: Secondary | ICD-10-CM | POA: Diagnosis present

## 2016-08-28 DIAGNOSIS — M170 Bilateral primary osteoarthritis of knee: Secondary | ICD-10-CM | POA: Diagnosis present

## 2016-08-28 DIAGNOSIS — I50814 Right heart failure due to left heart failure: Secondary | ICD-10-CM | POA: Diagnosis present

## 2016-08-28 DIAGNOSIS — Z87891 Personal history of nicotine dependence: Secondary | ICD-10-CM

## 2016-08-28 DIAGNOSIS — D649 Anemia, unspecified: Secondary | ICD-10-CM | POA: Diagnosis present

## 2016-08-28 DIAGNOSIS — F40240 Claustrophobia: Secondary | ICD-10-CM | POA: Diagnosis present

## 2016-08-28 HISTORY — DX: Other cardiomyopathies: I42.8

## 2016-08-28 HISTORY — DX: Morbid (severe) obesity due to excess calories: E66.01

## 2016-08-28 HISTORY — DX: Chronic kidney disease, stage 3 unspecified: N18.30

## 2016-08-28 HISTORY — DX: Pulmonary hypertension, unspecified: I27.20

## 2016-08-28 HISTORY — DX: Rheumatoid arthritis, unspecified: M06.9

## 2016-08-28 HISTORY — DX: Chronic obstructive pulmonary disease, unspecified: J44.9

## 2016-08-28 HISTORY — DX: Chronic kidney disease, stage 3 (moderate): N18.3

## 2016-08-28 HISTORY — DX: Chronic combined systolic (congestive) and diastolic (congestive) heart failure: I50.42

## 2016-08-28 LAB — COMPREHENSIVE METABOLIC PANEL
ALT: 12 U/L — AB (ref 14–54)
AST: 21 U/L (ref 15–41)
Albumin: 4.3 g/dL (ref 3.5–5.0)
Alkaline Phosphatase: 61 U/L (ref 38–126)
Anion gap: 13 (ref 5–15)
BUN: 63 mg/dL — AB (ref 6–20)
CHLORIDE: 93 mmol/L — AB (ref 101–111)
CO2: 34 mmol/L — ABNORMAL HIGH (ref 22–32)
CREATININE: 2.26 mg/dL — AB (ref 0.44–1.00)
Calcium: 9.4 mg/dL (ref 8.9–10.3)
GFR calc Af Amer: 26 mL/min — ABNORMAL LOW (ref >60)
GFR, EST NON AFRICAN AMERICAN: 23 mL/min — AB (ref >60)
Glucose, Bld: 110 mg/dL — ABNORMAL HIGH (ref 65–99)
Potassium: 3.5 mmol/L (ref 3.5–5.1)
Sodium: 140 mmol/L (ref 135–145)
Total Bilirubin: 1.2 mg/dL (ref 0.3–1.2)
Total Protein: 7.6 g/dL (ref 6.5–8.1)

## 2016-08-28 LAB — CBC WITH DIFFERENTIAL/PLATELET
BASOS PCT: 0 %
Basophils Absolute: 0 10*3/uL (ref 0.0–0.1)
EOS PCT: 1 %
Eosinophils Absolute: 0.1 10*3/uL (ref 0.0–0.7)
HEMATOCRIT: 34.9 % — AB (ref 36.0–46.0)
HEMOGLOBIN: 10.4 g/dL — AB (ref 12.0–15.0)
LYMPHS PCT: 11 %
Lymphs Abs: 1 10*3/uL (ref 0.7–4.0)
MCH: 26.2 pg (ref 26.0–34.0)
MCHC: 29.8 g/dL — ABNORMAL LOW (ref 30.0–36.0)
MCV: 87.9 fL (ref 78.0–100.0)
MONOS PCT: 5 %
Monocytes Absolute: 0.4 10*3/uL (ref 0.1–1.0)
NEUTROS PCT: 83 %
Neutro Abs: 7.2 10*3/uL (ref 1.7–7.7)
PLATELETS: 199 10*3/uL (ref 150–400)
RBC: 3.97 MIL/uL (ref 3.87–5.11)
RDW: 19.2 % — ABNORMAL HIGH (ref 11.5–15.5)
WBC: 8.7 10*3/uL (ref 4.0–10.5)

## 2016-08-28 LAB — I-STAT TROPONIN, ED: Troponin i, poc: 0.01 ng/mL (ref 0.00–0.08)

## 2016-08-28 LAB — CBG MONITORING, ED: Glucose-Capillary: 120 mg/dL — ABNORMAL HIGH (ref 65–99)

## 2016-08-28 LAB — BRAIN NATRIURETIC PEPTIDE: B NATRIURETIC PEPTIDE 5: 529 pg/mL — AB (ref 0.0–100.0)

## 2016-08-28 NOTE — ED Triage Notes (Signed)
Per EMS - patient is from home where she lives with family/friends.  Patient's son called EMS because patient fell from her chair to the floor.  Patient's son thought patient was unconscious, but on EMS arrival, patient was alert and responsive.  First EMS on scene stated patient's O2 was 79 on RA.  Patient uses home O2 at night.  Patient's lung sounds CTAB.  Patient's O2 rose to 98% on 6L.  Initial BP 134/78, HR 74 NSR, RR 18, CBG 112.  12 lead unremarkable, pt has 20 gauge LAC.  Patient c/o pain all over, but denies specific/focal pain.

## 2016-08-28 NOTE — ED Provider Notes (Signed)
[facility] DEPT Provider Note   CSN: [id number] Arrival date & time: 08/28/16  1901     History   Chief Complaint Chief Complaint  Patient presents with  . Loss of Consciousness    HPI Paige Marshall is a 60 y.o. female.  HPI  Today was out, christmas shopping In car on way home started to feel very short of breath, generally weak, achy,Was walking with walker then started to feel very short of breath and used her walker to go sit in chair. Put O2 on when she sat in chair. And continued to feel dyspnea, then had syncope and fell out of chair.  3 minutes of LOC, and total 4min of being out of it, seemed to breathing fast Did not feel lightheadedness before fall, did feel SOB and lightheadedness  No headache, no neck pain, no injuries from fall. Reports left knee pain which is chronic, not sure if exacerbated by fall. Also reports pain in whole left leg.   Dr. Gwenlyn Found is Cardiologist Past Medical History:  Diagnosis Date  . Arthritis    "both knees" (01/14/2015)  . Asthma   . CHF (congestive heart failure) ([place])   . Coronary artery disease   . Degenerative joint disease of both lower legs   . Dyspnea    progressive  . Edema    Lower extremity  . Former smoker   . Gout    hx in "both knees" (01/14/2015)  . High cholesterol   . Hypertension   . Nonischemic cardiomyopathy ([place])   . OSA (obstructive sleep apnea)    "never sent CPAP out" (01/14/2015)  . Paroxysmal atrial fibrillation (HCC)   . Pneumonia X 2    Patient Active Problem List   Diagnosis Date Noted  . Acute on chronic combined systolic and diastolic hrt fail ([place]) 123456  . 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 Pneumonia 04/04/2016  . Chronic systolic HF (heart failure) ([place]) 04/04/2016  . Hypomagnesemia 04/04/2016  . PNA (pneumonia) 04/04/2016  . AKI (acute kidney injury) ([place]) 03/08/2016  . Renal failure (ARF), acute on chronic (HCC) 03/08/2016  . ARF  (acute renal failure) ([place]) 03/08/2016  . Chronic respiratory failure with hypoxia ([place]) 10/24/2015  . COPD mixed type ([place]) 10/10/2015  . Asthma exacerbation 10/10/2015  . Acute bronchitis 10/10/2015  . 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 Uncontrolled hypertension   . Chest pain 01/18/2015  . Acute combined systolic and diastolic congestive heart failure ([place]) 01/14/2015  . Acute respiratory failure with hypoxia ([place]) 01/14/2015  . Hypokalemia 01/14/2015  . Obesity hypoventilation syndrome ([place]) 01/14/2015  . H. pylori infection 01/03/2014  . Obstructive sleep apnea 09/28/2013  . Morbid obesity ([place]) 09/28/2013  . PAF (paroxysmal atrial fibrillation) ([place]) 01/09/2010  . Dyslipidemia 10/17/2006  . Essential hypertension 10/17/2006  . VENTRICULAR HYPERTROPHY, LEFT 10/17/2006  . DYSFUNCTIONAL UTERINE BLEEDING 10/17/2006    Past Surgical History:  Procedure Laterality Date  . ANGIOGRAM/LV (CONGENITAL)  2007  . BREATH TEK H PYLORI N/A 12/31/2013   Procedure: [facility] [place];  Surgeon: Gayland Curry, MD;  Location: [facility];  Service: General;  Laterality: N/A;  . CORNEAL TRANSPLANT Right ~ 2010  . CORONARY ANGIOPLASTY WITH STENT PLACEMENT  11/04/2005   "1"  . DOPPLER ECHOCARDIOGRAPHY     2 D   EF of 45%  . EYE SURGERY    .  LEFT HEART CATHETERIZATION WITH CORONARY ANGIOGRAM N/A 01/20/2015   Procedure: LEFT HEART CATHETERIZATION WITH CORONARY ANGIOGRAM;  Surgeon: Lorretta Harp, MD;  Location: [facility] CATH LAB;  Service: Cardiovascular;  Laterality: N/A;  . sleep study  2011  . stress myocardial dipyridamole perfusion    . TUBAL LIGATION  1982  . venous duplex ultrasound  2013    OB History    No data available       Home Medications    Prior to Admission medications   Medication Sig Start Date End Date Taking? Authorizing Provider  albuterol (PROVENTIL HFA;VENTOLIN HFA) 108 (90 Base) MCG/ACT inhaler  Inhale 2 puffs into the lungs every 6 (six) hours as needed for wheezing or shortness of breath. 10/24/15  Yes Deneise Lever, MD  carvedilol (COREG) 25 MG tablet TAKE 2 TABLETS (50 MG TOTAL) BY MOUTH 2 (TWO) TIMES DAILY. 05/07/16  Yes Lorretta Harp, MD  colchicine 0.6 MG tablet Take 1 tablet (0.6 mg total) by mouth every other day. Take 1 tablet by mouth every 2 days Patient taking differently: Take 0.6 mg by mouth every other day. Take 1 tablet by mouth every 3 days 04/06/16  Yes Thurnell Lose, MD  diltiazem (DILACOR XR) 180 MG 24 hr capsule TAKE 1 CAPSULE (180 MG TOTAL) BY MOUTH DAILY. 05/07/16  Yes Lorretta Harp, MD  furosemide (LASIX) 80 MG tablet TAKE 1 TABLET (80 MG TOTAL) BY MOUTH 2 (TWO) TIMES DAILY. 05/07/16  Yes Lorretta Harp, MD  gabapentin (NEURONTIN) 300 MG capsule Take 300 mg by mouth 2 (two) times daily. 08/17/16  Yes Historical Provider, MD  Multiple Vitamin (MULTIVITAMIN WITH MINERALS) TABS tablet Take 1 tablet by mouth daily.   Yes Historical Provider, MD  Omega-3 Fatty Acids (FISH OIL CONCENTRATE PO) Take 1 capsule by mouth daily.    Yes Historical Provider, MD  Potassium Chloride ER 20 MEQ TBCR Take 20 mEq by mouth 2 (two) times daily. 05/07/16  Yes Lorretta Harp, MD  propafenone (RYTHMOL) 150 MG tablet TAKE 1 TABLET (150 MG TOTAL) BY MOUTH EVERY 8 (EIGHT) HOURS. 08/17/16  Yes Pixie Casino, MD  traMADol (ULTRAM) 50 MG tablet Take 50 mg by mouth 3 (three) times daily. 08/17/16  Yes Historical Provider, MD    Family History Family History  Problem Relation Age of Onset  . Heart disease Mother   . Cancer Father     colon  . Hypertension Other     Social History Social History  Substance Use Topics  . Smoking status: Former Smoker    Packs/day: 0.33    Years: 35.00    Types: Cigarettes    Quit date: 10/11/2008  . Smokeless tobacco: Never Used  . Alcohol use 4.8 oz/week    8 Cans of beer per week     Comment: 01/14/2015 "~ 4, 24oz cans beer/wk"      Allergies   Penicillins and Citrus   Review of Systems Review of Systems  Constitutional: Negative for fever.  HENT: Negative for sore throat.   Eyes: Negative for visual disturbance.  Respiratory: Positive for shortness of breath. Negative for cough.   Cardiovascular: Positive for chest pain, palpitations and leg swelling.  Gastrointestinal: Positive for nausea and vomiting. Negative for abdominal pain.  Genitourinary: Negative for difficulty urinating.  Musculoskeletal: Positive for arthralgias and myalgias. Negative for back pain and neck pain.  Skin: Negative for rash.  Neurological: Positive for syncope. Negative for facial asymmetry, weakness, numbness (bilat hands  no change) and headaches.     Physical Exam Updated Vital Signs BP (!) 148/77   Pulse 75   Temp 98.7 F (37.1 C) (Oral)   Resp 18   Ht 5' 7.5" (1.715 m)   Wt (!) 320 lb 12.3 oz (145.5 kg)   LMP 09/07/2011   SpO2 95%   BMI 49.50 kg/m   Physical Exam  Constitutional: She is oriented to person, place, and time. She appears well-developed and well-nourished. No distress.  HENT:  Head: Normocephalic and atraumatic.  Eyes: Conjunctivae and EOM are normal.  Neck: Normal range of motion.  Cardiovascular: Normal rate, regular rhythm, normal heart sounds and intact distal pulses.  Exam reveals no gallop and no friction rub.   No murmur heard. Pulmonary/Chest: Effort normal. No respiratory distress. She has no wheezes. She has rales (left base).  Diminished lung sounds throughout, exam limited by body habitus  Abdominal: Soft. She exhibits no distension. There is no tenderness. There is no guarding.  Musculoskeletal: She exhibits edema. She exhibits no tenderness.  LLE swelling slightly worse than right  Neurological: She is alert and oriented to person, place, and time.  Skin: Skin is warm and dry. No rash noted. She is not diaphoretic. No erythema.  Nursing note and vitals reviewed.    ED Treatments  / Results  Labs (all labs ordered are listed, but only abnormal results are displayed) Labs Reviewed  CBC WITH DIFFERENTIAL/PLATELET - Abnormal; Notable for the following:       Result Value   Hemoglobin 10.4 (*)    HCT 34.9 (*)    MCHC 29.8 (*)    RDW 19.2 (*)    All other components within normal limits  COMPREHENSIVE METABOLIC PANEL - Abnormal; Notable for the following:    Chloride 93 (*)    CO2 34 (*)    Glucose, Bld 110 (*)    BUN 63 (*)    Creatinine, Ser 2.26 (*)    ALT 12 (*)    GFR calc non Af Amer 23 (*)    GFR calc Af Amer 26 (*)    All other components within normal limits  BRAIN NATRIURETIC PEPTIDE - Abnormal; Notable for the following:    B Natriuretic Peptide 529.0 (*)    All other components within normal limits  D-DIMER, QUANTITATIVE (NOT AT [facility]) - Abnormal; Notable for the following:    D-Dimer, Quant 1.63 (*)    All other components within normal limits  CBG MONITORING, ED - Abnormal; Notable for the following:    Glucose-Capillary 120 (*)    All other components within normal limits  BASIC METABOLIC PANEL  I-STAT TROPOININ, ED  I-STAT TROPOININ, ED  POCT I-STAT TROPONIN I  I-STAT TROPOININ, ED    EKG  EKG Interpretation  Date/Time:  Saturday August 28 2016 19:18:25 EST Ventricular Rate:  68 PR Interval:    QRS Duration: 123 QT Interval:  448 QTC Calculation: 477 R Axis:   -67 Text Interpretation:  Sinus rhythm Prolonged PR interval Left bundle branch block No significant change since last tracing Confirmed by [facility] MD, Mele Sylvester ([id number]) on 08/28/2016 8:35:33 PM       Radiology Dg Chest 2 View  Result Date: 08/28/2016 CLINICAL DATA:  Dyspnea today. EXAM: CHEST  2 VIEW COMPARISON:  04/04/2016 FINDINGS: There is marked cardiomegaly, unchanged. There is mild vascular fullness, unchanged and likely chronic. No airspace consolidation. No effusions. No pneumothorax. Hilar and mediastinal contours are unremarkable and unchanged. IMPRESSION:  Stable cardiomegaly.  No consolidation or effusion. Electronically Signed   By: Andreas Newport M.D.   On: 08/28/2016 21:52   Dg Knee Complete 4 Views Left  Result Date: 08/29/2016 CLINICAL DATA:  Pain after fall. EXAM: LEFT KNEE - COMPLETE 4+ VIEW COMPARISON:  None. FINDINGS: There is a moderate suprapatellar joint effusion. Severe tricompartmental degenerative changes are noted. No fractures are seen. IMPRESSION: Moderate suprapatellar effusion with no visualized fracture. Electronically Signed   By: Dorise Bullion III M.D   On: 08/29/2016 01:37    Procedures Procedures (including critical care time)  Medications Ordered in ED Medications  albuterol (PROVENTIL) (2.5 MG/3ML) 0.083% nebulizer solution 2.5 mg (not administered)  diltiazem (DILACOR XR) 24 hr capsule 180 mg (180 mg Oral Given 08/29/16 0958)  carvedilol (COREG) tablet 50 mg (50 mg Oral Given 08/29/16 0802)  gabapentin (NEURONTIN) capsule 300 mg (300 mg Oral Given 08/29/16 0958)  multivitamin with minerals tablet 1 tablet (1 tablet Oral Given 08/29/16 0958)  omega-3 acid ethyl esters (LOVAZA) capsule 1 g (1 g Oral Given 08/29/16 0958)  propafenone (RYTHMOL) tablet 150 mg (not administered)  traMADol (ULTRAM) tablet 50 mg (50 mg Oral Given 08/29/16 0958)  sodium chloride flush (NS) 0.9 % injection 3 mL (3 mLs Intravenous Given 08/29/16 1002)  sodium chloride flush (NS) 0.9 % injection 3 mL (not administered)  0.9 %  sodium chloride infusion (not administered)  acetaminophen (TYLENOL) tablet 650 mg (650 mg Oral Given 08/29/16 0801)  ondansetron (ZOFRAN) injection 4 mg (not administered)  enoxaparin (LOVENOX) injection 40 mg (40 mg Subcutaneous Given 08/29/16 0958)  furosemide (LASIX) injection 80 mg (80 mg Intravenous Given 08/29/16 0808)  Influenza vac split quadrivalent PF (FLUARIX) injection 0.5 mL (not administered)  pneumococcal 23 valent vaccine (PNU-IMMUNE) injection 0.5 mL (not administered)  traMADol (ULTRAM)  tablet 50 mg (50 mg Oral Given 08/29/16 0044)     Initial Impression / Assessment and Plan / ED Course  I have reviewed the triage vital signs and the nursing notes.  Pertinent labs & imaging results that were available during my care of the patient were reviewed by me and considered in my medical decision making (see chart for details).  Clinical Course    60yo female with history of CHF on O2 at night, CAD, htn, hlpd, OSA, paroxysmal atrial fibrillation not on anticoagulation, CKD, presents with concern for shortness of breath, syncope, and hypoxia down to low 80s on room air in the ED.  No sign of pneumonia. ECG unchanged. Trop negative.  Possible CHF exacerbation vs PE, with syncope from PE, hypoxia from CHF, or arrhythmia.  BNP elevated from prior, however no significant edema on XR.  Given leg pain, DVT US ordered, will check ddimer, pursue VQ as inpt if indicated. Pt admitted to Dr. Alcario Drought.     Final Clinical Impressions(s) / ED Diagnoses   Final diagnoses:  Syncope, unspecified syncope type  Acute respiratory failure with hypoxia (HCC)  Acute on chronic combined systolic and diastolic congestive heart failure ([place])  Left leg pain    New Prescriptions Current Discharge Medication List       Gareth Morgan, MD 08/29/16 1212

## 2016-08-28 NOTE — ED Notes (Signed)
Bed: WA02 Expected date:  Expected time:  Means of arrival:  Comments: 60 yo syncope; knee pain

## 2016-08-29 ENCOUNTER — Inpatient Hospital Stay (HOSPITAL_COMMUNITY): Payer: Medicare Other

## 2016-08-29 DIAGNOSIS — I5043 Acute on chronic combined systolic (congestive) and diastolic (congestive) heart failure: Secondary | ICD-10-CM

## 2016-08-29 DIAGNOSIS — J9611 Chronic respiratory failure with hypoxia: Secondary | ICD-10-CM | POA: Diagnosis not present

## 2016-08-29 DIAGNOSIS — R7989 Other specified abnormal findings of blood chemistry: Secondary | ICD-10-CM

## 2016-08-29 DIAGNOSIS — E7800 Pure hypercholesterolemia, unspecified: Secondary | ICD-10-CM | POA: Diagnosis present

## 2016-08-29 DIAGNOSIS — D649 Anemia, unspecified: Secondary | ICD-10-CM | POA: Diagnosis present

## 2016-08-29 DIAGNOSIS — E662 Morbid (severe) obesity with alveolar hypoventilation: Secondary | ICD-10-CM | POA: Diagnosis present

## 2016-08-29 DIAGNOSIS — I50814 Right heart failure due to left heart failure: Secondary | ICD-10-CM | POA: Diagnosis present

## 2016-08-29 DIAGNOSIS — M79609 Pain in unspecified limb: Secondary | ICD-10-CM

## 2016-08-29 DIAGNOSIS — I429 Cardiomyopathy, unspecified: Secondary | ICD-10-CM | POA: Diagnosis present

## 2016-08-29 DIAGNOSIS — Z9119 Patient's noncompliance with other medical treatment and regimen: Secondary | ICD-10-CM | POA: Diagnosis not present

## 2016-08-29 DIAGNOSIS — R0602 Shortness of breath: Secondary | ICD-10-CM | POA: Diagnosis not present

## 2016-08-29 DIAGNOSIS — Z23 Encounter for immunization: Secondary | ICD-10-CM | POA: Diagnosis not present

## 2016-08-29 DIAGNOSIS — I50810 Right heart failure, unspecified: Secondary | ICD-10-CM | POA: Diagnosis not present

## 2016-08-29 DIAGNOSIS — M25562 Pain in left knee: Secondary | ICD-10-CM | POA: Diagnosis not present

## 2016-08-29 DIAGNOSIS — J449 Chronic obstructive pulmonary disease, unspecified: Secondary | ICD-10-CM

## 2016-08-29 DIAGNOSIS — I130 Hypertensive heart and chronic kidney disease with heart failure and stage 1 through stage 4 chronic kidney disease, or unspecified chronic kidney disease: Secondary | ICD-10-CM | POA: Diagnosis present

## 2016-08-29 DIAGNOSIS — N179 Acute kidney failure, unspecified: Secondary | ICD-10-CM | POA: Diagnosis not present

## 2016-08-29 DIAGNOSIS — I2510 Atherosclerotic heart disease of native coronary artery without angina pectoris: Secondary | ICD-10-CM | POA: Diagnosis present

## 2016-08-29 DIAGNOSIS — S82832A Other fracture of upper and lower end of left fibula, initial encounter for closed fracture: Secondary | ICD-10-CM | POA: Diagnosis present

## 2016-08-29 DIAGNOSIS — W07XXXA Fall from chair, initial encounter: Secondary | ICD-10-CM | POA: Diagnosis not present

## 2016-08-29 DIAGNOSIS — R791 Abnormal coagulation profile: Secondary | ICD-10-CM | POA: Diagnosis present

## 2016-08-29 DIAGNOSIS — J9601 Acute respiratory failure with hypoxia: Secondary | ICD-10-CM | POA: Diagnosis not present

## 2016-08-29 DIAGNOSIS — S82442A Displaced spiral fracture of shaft of left fibula, initial encounter for closed fracture: Secondary | ICD-10-CM | POA: Diagnosis not present

## 2016-08-29 DIAGNOSIS — R55 Syncope and collapse: Secondary | ICD-10-CM

## 2016-08-29 DIAGNOSIS — I493 Ventricular premature depolarization: Secondary | ICD-10-CM | POA: Diagnosis present

## 2016-08-29 DIAGNOSIS — N184 Chronic kidney disease, stage 4 (severe): Secondary | ICD-10-CM | POA: Diagnosis present

## 2016-08-29 DIAGNOSIS — Y92009 Unspecified place in unspecified non-institutional (private) residence as the place of occurrence of the external cause: Secondary | ICD-10-CM | POA: Diagnosis not present

## 2016-08-29 DIAGNOSIS — M069 Rheumatoid arthritis, unspecified: Secondary | ICD-10-CM | POA: Diagnosis present

## 2016-08-29 DIAGNOSIS — I5033 Acute on chronic diastolic (congestive) heart failure: Secondary | ICD-10-CM | POA: Diagnosis present

## 2016-08-29 DIAGNOSIS — I480 Paroxysmal atrial fibrillation: Secondary | ICD-10-CM | POA: Diagnosis present

## 2016-08-29 DIAGNOSIS — G4733 Obstructive sleep apnea (adult) (pediatric): Secondary | ICD-10-CM | POA: Diagnosis present

## 2016-08-29 DIAGNOSIS — J9621 Acute and chronic respiratory failure with hypoxia: Secondary | ICD-10-CM | POA: Diagnosis present

## 2016-08-29 DIAGNOSIS — M25462 Effusion, left knee: Secondary | ICD-10-CM | POA: Diagnosis not present

## 2016-08-29 DIAGNOSIS — I2729 Other secondary pulmonary hypertension: Secondary | ICD-10-CM | POA: Diagnosis present

## 2016-08-29 DIAGNOSIS — Z6841 Body Mass Index (BMI) 40.0 and over, adult: Secondary | ICD-10-CM | POA: Diagnosis not present

## 2016-08-29 DIAGNOSIS — Z87891 Personal history of nicotine dependence: Secondary | ICD-10-CM | POA: Diagnosis not present

## 2016-08-29 DIAGNOSIS — I509 Heart failure, unspecified: Secondary | ICD-10-CM | POA: Diagnosis not present

## 2016-08-29 LAB — POCT I-STAT TROPONIN I: TROPONIN I, POC: 0.02 ng/mL (ref 0.00–0.08)

## 2016-08-29 LAB — BASIC METABOLIC PANEL
Anion gap: 12 (ref 5–15)
BUN: 65 mg/dL — AB (ref 6–20)
CALCIUM: 8.8 mg/dL — AB (ref 8.9–10.3)
CO2: 37 mmol/L — ABNORMAL HIGH (ref 22–32)
CREATININE: 2.41 mg/dL — AB (ref 0.44–1.00)
Chloride: 91 mmol/L — ABNORMAL LOW (ref 101–111)
GFR calc Af Amer: 24 mL/min — ABNORMAL LOW (ref >60)
GFR, EST NON AFRICAN AMERICAN: 21 mL/min — AB (ref >60)
GLUCOSE: 163 mg/dL — AB (ref 65–99)
POTASSIUM: 3.3 mmol/L — AB (ref 3.5–5.1)
SODIUM: 140 mmol/L (ref 135–145)

## 2016-08-29 LAB — D-DIMER, QUANTITATIVE: D-Dimer, Quant: 1.63 ug/mL-FEU — ABNORMAL HIGH (ref 0.00–0.50)

## 2016-08-29 MED ORDER — GABAPENTIN 300 MG PO CAPS
300.0000 mg | ORAL_CAPSULE | Freq: Two times a day (BID) | ORAL | Status: DC
  Administered 2016-08-29 – 2016-08-30 (×4): 300 mg via ORAL
  Filled 2016-08-29 (×4): qty 1

## 2016-08-29 MED ORDER — INFLUENZA VAC SPLIT QUAD 0.5 ML IM SUSY
0.5000 mL | PREFILLED_SYRINGE | INTRAMUSCULAR | Status: AC
  Administered 2016-08-30: 0.5 mL via INTRAMUSCULAR
  Filled 2016-08-29: qty 0.5

## 2016-08-29 MED ORDER — OMEGA-3-ACID ETHYL ESTERS 1 G PO CAPS
1.0000 g | ORAL_CAPSULE | Freq: Every day | ORAL | Status: DC
  Administered 2016-08-29 – 2016-09-01 (×4): 1 g via ORAL
  Filled 2016-08-29 (×4): qty 1

## 2016-08-29 MED ORDER — SODIUM CHLORIDE 0.9% FLUSH
3.0000 mL | Freq: Two times a day (BID) | INTRAVENOUS | Status: DC
  Administered 2016-08-29 – 2016-08-31 (×6): 3 mL via INTRAVENOUS

## 2016-08-29 MED ORDER — ACETAMINOPHEN 325 MG PO TABS
650.0000 mg | ORAL_TABLET | ORAL | Status: DC | PRN
  Administered 2016-08-29 (×2): 650 mg via ORAL
  Filled 2016-08-29 (×2): qty 2

## 2016-08-29 MED ORDER — FUROSEMIDE 10 MG/ML IJ SOLN
120.0000 mg | Freq: Two times a day (BID) | INTRAVENOUS | Status: DC
  Administered 2016-08-29: 120 mg via INTRAVENOUS
  Filled 2016-08-29: qty 10
  Filled 2016-08-29: qty 12

## 2016-08-29 MED ORDER — PROPAFENONE HCL 150 MG PO TABS
150.0000 mg | ORAL_TABLET | Freq: Three times a day (TID) | ORAL | Status: DC
  Administered 2016-08-30 – 2016-09-01 (×8): 150 mg via ORAL
  Filled 2016-08-29 (×8): qty 1

## 2016-08-29 MED ORDER — CARVEDILOL 25 MG PO TABS
50.0000 mg | ORAL_TABLET | Freq: Two times a day (BID) | ORAL | Status: DC
  Administered 2016-08-29 – 2016-09-01 (×7): 50 mg via ORAL
  Filled 2016-08-29 (×8): qty 2

## 2016-08-29 MED ORDER — FUROSEMIDE 10 MG/ML IJ SOLN
80.0000 mg | Freq: Two times a day (BID) | INTRAMUSCULAR | Status: DC
  Administered 2016-08-29: 80 mg via INTRAVENOUS
  Filled 2016-08-29: qty 8

## 2016-08-29 MED ORDER — DILTIAZEM HCL ER 180 MG PO CP24
180.0000 mg | ORAL_CAPSULE | Freq: Every day | ORAL | Status: DC
  Administered 2016-08-29 – 2016-09-01 (×4): 180 mg via ORAL
  Filled 2016-08-29 (×8): qty 1

## 2016-08-29 MED ORDER — TRAMADOL HCL 50 MG PO TABS
50.0000 mg | ORAL_TABLET | Freq: Three times a day (TID) | ORAL | Status: DC
  Administered 2016-08-29 – 2016-08-30 (×5): 50 mg via ORAL
  Filled 2016-08-29 (×5): qty 1

## 2016-08-29 MED ORDER — SODIUM CHLORIDE 0.9% FLUSH: 3.0000 mL | INTRAVENOUS | Status: DC | PRN

## 2016-08-29 MED ORDER — TRAMADOL HCL 50 MG PO TABS
50.0000 mg | ORAL_TABLET | Freq: Once | ORAL | Status: AC
  Administered 2016-08-29: 50 mg via ORAL
  Filled 2016-08-29: qty 1

## 2016-08-29 MED ORDER — ADULT MULTIVITAMIN W/MINERALS CH
1.0000 | ORAL_TABLET | Freq: Every day | ORAL | Status: DC
  Administered 2016-08-29 – 2016-09-01 (×4): 1 via ORAL
  Filled 2016-08-29 (×4): qty 1

## 2016-08-29 MED ORDER — SODIUM CHLORIDE 0.9 % IV SOLN: 250.0000 mL | INTRAVENOUS | Status: DC | PRN

## 2016-08-29 MED ORDER — ONDANSETRON HCL 4 MG/2ML IJ SOLN: 4.0000 mg | Freq: Four times a day (QID) | INTRAMUSCULAR | Status: DC | PRN

## 2016-08-29 MED ORDER — PNEUMOCOCCAL VAC POLYVALENT 25 MCG/0.5ML IJ INJ
0.5000 mL | INJECTION | INTRAMUSCULAR | Status: AC
  Administered 2016-08-30: 0.5 mL via INTRAMUSCULAR
  Filled 2016-08-29 (×2): qty 0.5

## 2016-08-29 MED ORDER — ENOXAPARIN SODIUM 40 MG/0.4ML ~~LOC~~ SOLN
40.0000 mg | SUBCUTANEOUS | Status: DC
  Administered 2016-08-29 – 2016-09-01 (×4): 40 mg via SUBCUTANEOUS
  Filled 2016-08-29 (×4): qty 0.4

## 2016-08-29 MED ORDER — ALBUTEROL SULFATE (2.5 MG/3ML) 0.083% IN NEBU: 2.5000 mg | INHALATION_SOLUTION | Freq: Four times a day (QID) | RESPIRATORY_TRACT | Status: DC | PRN

## 2016-08-29 NOTE — H&P (Signed)
History and Physical    Paige Marshall [id number] DOB: Jan 14, 1956 DOA: 08/28/2016   PCP: Kerin Perna, NP Chief Complaint:  Chief Complaint  Patient presents with  . Loss of Consciousness    HPI: Paige Marshall is a 60 y.o. female with medical history significant of NICM with EF 40-45% in April 2016, CKD stage 3 at baseline, PAF, HTN, morbid obesity, mixed type COPD.  At baseline she uses O2 only at night, unable to tolerate CPAP.  Patient presents to the ED after a syncopal episode at home.  She felt very SOB prior to episode and was noted to have RA O2 sat of 79% by EMS when they got there.  Of note she has been feeling SOB for at least the past week.  She saw her PCP last week and was noted to "have too much fluid" with 10lbs weight gain.  Patient does report no changes to her usual 80mg  BID lasix that she takes.  ED Course: BNP is 529 which is elevated from her prior baseline, of note she was 87 in June of this year.  CXR demonstrates marked cardiomegaly, and mild pulmonary vascular congestion.  No edema or infiltrates.  Creatinine is up to 2.2, baseline from admit earlier this year appears to be 1.6; however, it sounds like elevation in the intermediate period was noted as outpatient as they made her stop taking NSAIDs due to worsening kidney function she reports.  Review of Systems: As per HPI otherwise 10 point review of systems negative.    Past Medical History:  Diagnosis Date  . Arthritis    "both knees" (01/14/2015)  . Asthma   . CHF (congestive heart failure) ([place])   . Coronary artery disease   . Degenerative joint disease of both lower legs   . Dyspnea    progressive  . Edema    Lower extremity  . Former smoker   . Gout    hx in "both knees" (01/14/2015)  . High cholesterol   . Hypertension   . Nonischemic cardiomyopathy ([place])   . OSA (obstructive sleep apnea)    "never sent CPAP out" (01/14/2015)  . Paroxysmal atrial fibrillation (HCC)   .  Pneumonia X 2    Past Surgical History:  Procedure Laterality Date  . ANGIOGRAM/LV (CONGENITAL)  2007  . BREATH TEK H PYLORI N/A 12/31/2013   Procedure: [place];  Surgeon: Gayland Curry, MD;  Location: [facility];  Service: General;  Laterality: N/A;  . CORNEAL TRANSPLANT Right ~ 2010  . CORONARY ANGIOPLASTY WITH STENT PLACEMENT  11/04/2005   "1"  . DOPPLER ECHOCARDIOGRAPHY     2 D   EF of 45%  . EYE SURGERY    . LEFT HEART CATHETERIZATION WITH CORONARY ANGIOGRAM N/A 01/20/2015   Procedure: LEFT HEART CATHETERIZATION WITH CORONARY ANGIOGRAM;  Surgeon: Lorretta Harp, MD;  Location: [facility] CATH LAB;  Service: Cardiovascular;  Laterality: N/A;  . sleep study  2011  . stress myocardial dipyridamole perfusion    . TUBAL LIGATION  1982  . venous duplex ultrasound  2013     reports that she quit smoking about 7 years ago. Her smoking use included Cigarettes. She has a 11.55 pack-year smoking history. She has never used smokeless tobacco. She reports that she drinks about 4.8 oz of alcohol per week . She reports that she does not use drugs.  Allergies  Allergen Reactions  . Penicillins Hives, Itching, Swelling and Rash  Has patient had a PCN reaction causing immediate rash, facial/tongue/throat swelling, SOB or lightheadedness with hypotension:  Has patient had a PCN reaction causing severe rash involving mucus membranes or skin necrosis: Yes Has patient had a PCN reaction that required hospitalization Yes Has patient had a PCN reaction occurring within the last 10 years: No If all of the above answers are "NO", then may proceed with Cephalosporin use.   . Citrus Hives    Family History  Problem Relation Age of Onset  . Heart disease Mother   . Cancer Father     colon  . Hypertension Other      Prior to Admission medications   Medication Sig Start Date End Date Taking? Authorizing Provider  albuterol (PROVENTIL HFA;VENTOLIN HFA) 108 (90 Base) MCG/ACT inhaler Inhale  2 puffs into the lungs every 6 (six) hours as needed for wheezing or shortness of breath. 10/24/15  Yes Deneise Lever, MD  carvedilol (COREG) 25 MG tablet TAKE 2 TABLETS (50 MG TOTAL) BY MOUTH 2 (TWO) TIMES DAILY. 05/07/16  Yes Lorretta Harp, MD  colchicine 0.6 MG tablet Take 1 tablet (0.6 mg total) by mouth every other day. Take 1 tablet by mouth every 2 days Patient taking differently: Take 0.6 mg by mouth every other day. Take 1 tablet by mouth every 3 days 04/06/16  Yes Thurnell Lose, MD  diltiazem (DILACOR XR) 180 MG 24 hr capsule TAKE 1 CAPSULE (180 MG TOTAL) BY MOUTH DAILY. 05/07/16  Yes Lorretta Harp, MD  furosemide (LASIX) 80 MG tablet TAKE 1 TABLET (80 MG TOTAL) BY MOUTH 2 (TWO) TIMES DAILY. 05/07/16  Yes Lorretta Harp, MD  gabapentin (NEURONTIN) 300 MG capsule Take 300 mg by mouth 2 (two) times daily. 08/17/16  Yes Historical Provider, MD  Multiple Vitamin (MULTIVITAMIN WITH MINERALS) TABS tablet Take 1 tablet by mouth daily.   Yes Historical Provider, MD  Omega-3 Fatty Acids (FISH OIL CONCENTRATE PO) Take 1 capsule by mouth daily.    Yes Historical Provider, MD  Potassium Chloride ER 20 MEQ TBCR Take 20 mEq by mouth 2 (two) times daily. 05/07/16  Yes Lorretta Harp, MD  propafenone (RYTHMOL) 150 MG tablet TAKE 1 TABLET (150 MG TOTAL) BY MOUTH EVERY 8 (EIGHT) HOURS. 08/17/16  Yes Pixie Casino, MD  traMADol (ULTRAM) 50 MG tablet Take 50 mg by mouth 3 (three) times daily. 08/17/16  Yes Historical Provider, MD    Physical Exam: Vitals:   08/28/16 2000 08/28/16 2030 08/28/16 2130 08/28/16 2200  BP: (!) 109/35 126/75 134/78 126/75  Pulse: 69 69 71 69  Resp: 22 18 23 16   Temp:      TempSrc:      SpO2: 94% 95% 95% 95%      Constitutional: NAD, calm, comfortable Eyes: PERRL, lids and conjunctivae normal ENMT: Mucous membranes are moist. Posterior pharynx clear of any exudate or lesions.Normal dentition.  Neck: normal, supple, no masses, no thyromegaly Respiratory: clear to  auscultation bilaterally, no wheezing, no crackles. Normal respiratory effort. No accessory muscle use.  Is somewhat difficult to auscultate due to patient habitus. Cardiovascular: Regular rate and rhythm, no murmurs / rubs / gallops. Trace pitting edema BLE. 2+ pedal pulses. No carotid bruits.  Abdomen: no tenderness, no masses palpated. No hepatosplenomegaly. Bowel sounds positive.  Musculoskeletal: no clubbing / cyanosis. No joint deformity upper and lower extremities. Good ROM, no contractures. Normal muscle tone.  LLE tender just under knee. Skin: no rashes, lesions, ulcers. No induration  Neurologic: CN 2-12 grossly intact. Sensation intact, DTR normal. Strength 5/5 in all 4.  Psychiatric: Normal judgment and insight. Alert and oriented x 3. Normal mood.    Labs on Admission: I have personally reviewed following labs and imaging studies  CBC:  Recent Labs Lab 08/28/16 2132  WBC 8.7  NEUTROABS 7.2  HGB 10.4*  HCT 34.9*  MCV 87.9  PLT [id number]   Basic Metabolic Panel:  Recent Labs Lab 08/28/16 2132  NA 140  K 3.5  CL 93*  CO2 34*  GLUCOSE 110*  BUN 63*  CREATININE 2.26*  CALCIUM 9.4   GFR: CrCl cannot be calculated (Unknown ideal weight.). Liver Function Tests:  Recent Labs Lab 08/28/16 2132  AST 21  ALT 12*  ALKPHOS 61  BILITOT 1.2  PROT 7.6  ALBUMIN 4.3   No results for input(s): LIPASE, AMYLASE in the last 168 hours. No results for input(s): AMMONIA in the last 168 hours. Coagulation Profile: No results for input(s): INR, PROTIME in the last 168 hours. Cardiac Enzymes: No results for input(s): CKTOTAL, CKMB, CKMBINDEX, TROPONINI in the last 168 hours. BNP (last 3 results) No results for input(s): PROBNP in the last 8760 hours. HbA1C: No results for input(s): HGBA1C in the last 72 hours. CBG:  Recent Labs Lab 08/28/16 1923  GLUCAP 120*   Lipid Profile: No results for input(s): CHOL, HDL, LDLCALC, TRIG, CHOLHDL, LDLDIRECT in the last 72  hours. Thyroid Function Tests: No results for input(s): TSH, T4TOTAL, FREET4, T3FREE, THYROIDAB in the last 72 hours. Anemia Panel: No results for input(s): VITAMINB12, FOLATE, FERRITIN, TIBC, IRON, RETICCTPCT in the last 72 hours. Urine analysis:    Component Value Date/Time   COLORURINE YELLOW 04/04/2016 1206   APPEARANCEUR HAZY (A) 04/04/2016 1206   LABSPEC 1.011 04/04/2016 1206   PHURINE 6.5 04/04/2016 1206   GLUCOSEU NEGATIVE 04/04/2016 1206   HGBUR NEGATIVE 04/04/2016 1206   BILIRUBINUR NEGATIVE 04/04/2016 1206   KETONESUR NEGATIVE 04/04/2016 1206   PROTEINUR NEGATIVE 04/04/2016 1206   UROBILINOGEN 1.0 11/16/2009 0554   NITRITE NEGATIVE 04/04/2016 1206   LEUKOCYTESUR NEGATIVE 04/04/2016 1206   Sepsis Labs: @LABRCNTIP (procalcitonin:4,lacticidven:4) )No results found for this or any previous visit (from the past 240 hour(s)).   Radiological Exams on Admission: Dg Chest 2 View  Result Date: 08/28/2016 CLINICAL DATA:  Dyspnea today. EXAM: CHEST  2 VIEW COMPARISON:  04/04/2016 FINDINGS: There is marked cardiomegaly, unchanged. There is mild vascular fullness, unchanged and likely chronic. No airspace consolidation. No effusions. No pneumothorax. Hilar and mediastinal contours are unremarkable and unchanged. IMPRESSION: Stable cardiomegaly.  No consolidation or effusion. Electronically Signed   By: Andreas Newport M.D.   On: 08/28/2016 21:52    EKG: Independently reviewed.  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 Acute on chronic respiratory failure with hypoxia - DDx includes acute CHF, LLE DVT causing PE is also possible but seems somewhat less likely. 1. Will  treat as CHF given the weight gain, BNP elevation compared to prior. 2. Obtaining D.Dimer and US venous of BLE to look for DVT.  If positive then try and get VQ scan 2. Acute on chronic combined CHF - 1. CHF pathway 2. Convert  lasix to IV (80mg  IV BID) 3. Close monitoring of renal function with diuresis 4. 2d echo 3. COPD - 1. Sounds like the majority of her chronic lung problems are from OSA / HVOS 2. O2 as needed 3. I really wish she could tolerate NIPPV which is what she needs 4. CKD stage 4 - 1. Close monitoring with diuresis 2. Likely warrants nephrology evaluation as outpatient at a minimum given worsening   DVT prophylaxis: Lovenox Code Status: Full Family Communication: Family at bedside Consults called: None Admission status: Admit to inpatient   REANA, NICKLESON DO [facility] Pager [phone] from 7PM-7AM  If 7AM-7PM, please contact the day physician for the patient [email or web address] Password [email or web address]  08/29/2016, 1:06 AM

## 2016-08-29 NOTE — Progress Notes (Signed)
*  PRELIMINARY RESULTS* Vascular Ultrasound Lower extremity venous duplex has been completed.  Preliminary findings: no obvious evidence of DVT bilaterally. Bilateral baker's cysts noted, left larger than right.    Landry Mellow, RDMS, RVT  08/29/2016, 8:53 AM

## 2016-08-29 NOTE — Progress Notes (Signed)
S:  Feeling [place] of breath.  Not voiding much after lasix.  Denies chest pains.  Having pain in the anterior left knee and asking whether her x-ray showed it was broken O:   Vitals:   08/29/16 0601 08/29/16 0958  BP: 101/63 (!) 148/77  Pulse: 75   Resp: 18   Temp: 98.7 F (37.1 C)    Gen:  Obese female, NAD HEENT:  NCAT, MMM CV:  RRR, no obvious murmurs PULM:  Distant breath sounds, no obvious wheeze, rales, or rhonchi ABD:  NABS, soft, NT MSK:  TTP over the anterior left knee with some swelling but no erythema or induration.  No LEE.    A/P:    Dyspnea, likely due to fluid retention from acute on chronic systolic heart failure brought on by NSAID use for the last month -  Increase lasix to 120mg  IV BID -  Will order VQ scan > will be done tomorrow -  Duplex negative -  Counseled against NSAID use -  Trend BMP  Agree with rest of assessment and plan by Alcario Drought.

## 2016-08-30 ENCOUNTER — Inpatient Hospital Stay (HOSPITAL_COMMUNITY): Payer: Medicare Other

## 2016-08-30 DIAGNOSIS — I509 Heart failure, unspecified: Secondary | ICD-10-CM

## 2016-08-30 LAB — BASIC METABOLIC PANEL
ANION GAP: 10 (ref 5–15)
BUN: 74 mg/dL — ABNORMAL HIGH (ref 6–20)
CHLORIDE: 91 mmol/L — AB (ref 101–111)
CO2: 39 mmol/L — ABNORMAL HIGH (ref 22–32)
Calcium: 8.9 mg/dL (ref 8.9–10.3)
Creatinine, Ser: 2.34 mg/dL — ABNORMAL HIGH (ref 0.44–1.00)
GFR calc non Af Amer: 22 mL/min — ABNORMAL LOW (ref >60)
GFR, EST AFRICAN AMERICAN: 25 mL/min — AB (ref >60)
Glucose, Bld: 117 mg/dL — ABNORMAL HIGH (ref 65–99)
POTASSIUM: 3.3 mmol/L — AB (ref 3.5–5.1)
SODIUM: 140 mmol/L (ref 135–145)

## 2016-08-30 LAB — CBC
HEMATOCRIT: 32.3 % — AB (ref 36.0–46.0)
Hemoglobin: 9.7 g/dL — ABNORMAL LOW (ref 12.0–15.0)
MCH: 27 pg (ref 26.0–34.0)
MCHC: 30 g/dL (ref 30.0–36.0)
MCV: 90 fL (ref 78.0–100.0)
PLATELETS: 213 10*3/uL (ref 150–400)
RBC: 3.59 MIL/uL — AB (ref 3.87–5.11)
RDW: 20 % — ABNORMAL HIGH (ref 11.5–15.5)
WBC: 7.5 10*3/uL (ref 4.0–10.5)

## 2016-08-30 LAB — PROTIME-INR
INR: 1.08
Prothrombin Time: 14 seconds (ref 11.4–15.2)

## 2016-08-30 LAB — ECHOCARDIOGRAM COMPLETE
Height: 67.5 in
Weight: 5234.6 oz

## 2016-08-30 LAB — APTT: APTT: 42 s — AB (ref 24–36)

## 2016-08-30 MED ORDER — GABAPENTIN 300 MG PO CAPS
300.0000 mg | ORAL_CAPSULE | Freq: Every day | ORAL | Status: DC
  Administered 2016-08-31: 300 mg via ORAL
  Filled 2016-08-30: qty 1

## 2016-08-30 MED ORDER — PERFLUTREN LIPID MICROSPHERE
INTRAVENOUS | Status: AC
  Filled 2016-08-30: qty 10

## 2016-08-30 MED ORDER — PERFLUTREN LIPID MICROSPHERE
1.0000 mL | INTRAVENOUS | Status: AC | PRN
  Administered 2016-08-30: 2 mL via INTRAVENOUS
  Filled 2016-08-30: qty 10

## 2016-08-30 MED ORDER — DICLOFENAC SODIUM 1 % TD GEL
4.0000 g | Freq: Four times a day (QID) | TRANSDERMAL | Status: DC
  Administered 2016-08-30 – 2016-09-01 (×5): 4 g via TOPICAL
  Filled 2016-08-30: qty 100

## 2016-08-30 MED ORDER — DOXYCYCLINE HYCLATE 100 MG PO TABS
100.0000 mg | ORAL_TABLET | Freq: Two times a day (BID) | ORAL | Status: DC
  Administered 2016-08-30 – 2016-09-01 (×5): 100 mg via ORAL
  Filled 2016-08-30 (×5): qty 1

## 2016-08-30 MED ORDER — OXYCODONE HCL 5 MG PO TABS
5.0000 mg | ORAL_TABLET | Freq: Four times a day (QID) | ORAL | Status: DC | PRN
  Administered 2016-08-30 – 2016-09-01 (×5): 5 mg via ORAL
  Filled 2016-08-30 (×5): qty 1

## 2016-08-30 MED ORDER — LEVALBUTEROL HCL 1.25 MG/0.5ML IN NEBU
1.2500 mg | INHALATION_SOLUTION | Freq: Two times a day (BID) | RESPIRATORY_TRACT | Status: DC
  Administered 2016-08-30 – 2016-09-01 (×4): 1.25 mg via RESPIRATORY_TRACT
  Filled 2016-08-30 (×4): qty 0.5

## 2016-08-30 MED ORDER — PREDNISONE 20 MG PO TABS
40.0000 mg | ORAL_TABLET | Freq: Every day | ORAL | Status: DC
  Administered 2016-08-30 – 2016-09-01 (×3): 40 mg via ORAL
  Filled 2016-08-30 (×3): qty 2

## 2016-08-30 MED ORDER — FUROSEMIDE 40 MG PO TABS
80.0000 mg | ORAL_TABLET | Freq: Two times a day (BID) | ORAL | Status: DC
Start: 2016-08-30 — End: 2016-09-01
  Administered 2016-08-30 – 2016-09-01 (×4): 80 mg via ORAL
  Filled 2016-08-30 (×4): qty 2

## 2016-08-30 NOTE — Progress Notes (Signed)
Pt not able to tolerate VQ scan due to claustrophobia. MD notified. Will continue to monitor.

## 2016-08-30 NOTE — Progress Notes (Signed)
PT Cancellation Note  Patient Details Name: Paige Marshall MRN: [id number] DOB: 03-15-1956   Cancelled Treatment:    Reason Eval/Treat Not Completed: Other (comment) L ankle xray: "Oblique minimally displaced fracture through the distal left fibula".  Will await weight bearing status prior to ambulating   Tasneem Cormier,KATHrine E 08/30/2016, 3:06 PM Carmelia Bake, PT, DPT 08/30/2016 Pager: [phone]

## 2016-08-30 NOTE — Progress Notes (Addendum)
PROGRESS NOTE  Paige Marshall  [id number] DOB: 02/26/1956 DOA: 08/28/2016 PCP: Kerin Perna, NP  Brief Narrative:   60yo female with history of CHF on O2 at night, CAD, htn, hlpd, OSA, paroxysmal atrial fibrillation not on anticoagulation, CKD, presents with concern for shortness of breath, syncope, and hypoxia down to low 80s on room air in the ED.  No sign of pneumonia. ECG unchanged. Trop negative.  Possible CHF exacerbation as BNP elevated, but no lower extremity edema and not responding to lasix 120mg  IV BID.  D-dimer was elevated and lower extremity duplex negative for DVT.  unable to complete a CT angio due to CKD and patient too claustrophobic for VQ scan.  Has history of PAF and has had some [place] runs of what looks like VT on telemetry but I think this is a-fib with RVR.  She has been asymptomatic during these episodes.  ECHO pending.  Still SOB.    Assessment & Plan:   Principal Problem:   Acute respiratory failure with hypoxia (HCC) Active Problems:   PAF (paroxysmal atrial fibrillation) (HCC)   COPD mixed type (HCC)   Chronic respiratory failure with hypoxia (HCC)   Acute on chronic combined systolic and diastolic congestive heart failure (HCC)   CKD (chronic kidney disease) stage 4, GFR 15-29 ml/min (HCC)   Elevated d-dimer   Syncope  Syncope, likely due to hypoxia but could also have been an arrhythmia.  Etiology of acute on chronic respiratory failure is unclear.  Given elevated CO2, likely has obesity hypoventilation syndrome.  Wheezing today.  Since she did not respond to diuresis and BUN now rising, will try resuming steroids for possible asthma vs. COPD exacerbation (has history of smoking) superimposed on OHS/HVOS.  Volume status very difficult to determine given body habitus.   -  Cardiology consult for second opinion about diuresis -  D/c IV lasix and start oral lasix -  Previously required metolazone for diuresis -  ECHO pending  -  PT evaluation  with ambulatory pulse ox -  Prednisone 40mg  daily -  Start doxycycline x 3 days -  Scheduled xopenex -  Continue telemetry  Paroxysmal atrial fibrillation, CHADs2vasc = 3 due to CHF, HTN, and female, recommend anticoagulation with warfarin given CKD, however, she had "excessive bleeding" per cardiology notes so he a/c was discontinued -  Chronic propafenone and diltiazem -  Followed by Dr. Gwenlyn Found  OSA, not using CPAP, only 2L [place] at night -  Previously followed by Dr. Annamaria Boots, Pulm -  Trial of nightly CPAP  Morbid obesity, has been losing some weight.  Weighed 383lb=174kg 2015, down to 327lbs = 148kg currently -  Encouraged ongoing weight loss but difficult given knee arthritis  Left knee prepatellar effusion -  Start voltaren gel  Left ankle pain -  XR left ankle -  Worried about escalating pain medications because she is CO2 retainer  CKD stage 4, creatinine 1.6-1.7 in July, currently 2.3-2.4 -  F/u with nephrology as outpatient  DVT prophylaxis:  Lovenox Code Status:  full Family Communication:  Patient alone Disposition Plan:  Awaiting PT evaluation (pending X-ray of the ankle)   Consultants:   none  Procedures:  ECHO  Antimicrobials:   Doxycycline   Subjective:  Having severe pain in her left ankle and left knee making ambulation difficult.  Breathing is about the same.  Still did not void more last night despite higher dose of lasix.    Objective: Vitals:   08/29/16 2054 08/30/16  0503 08/30/16 0900 08/30/16 1112  BP: 117/63 117/81 (!) 139/53 (!) 139/53  Pulse: 63 81 81   Resp: 18 18 18    Temp: 98.2 F (36.8 C) 98.3 F (36.8 C) 98.1 F (36.7 C)   TempSrc: Oral Oral Oral   SpO2: 98% 98% 95%   Weight:  (!) 148.4 kg (327 lb 2.6 oz)    Height:        Intake/Output Summary (Last 24 hours) at 08/30/16 1129 Last data filed at 08/30/16 0900  Gross per 24 hour  Intake             1622 ml  Output              975 ml  Net              647 ml   Filed  Weights   08/29/16 0201 08/29/16 0601 08/30/16 0503  Weight: (!) 155 kg (341 lb 11.4 oz) (!) 145.5 kg (320 lb 12.3 oz) (!) 148.4 kg (327 lb 2.6 oz)    Examination:   Gen:  Obese female, NAD HEENT:  NCAT, MMM CV:  RRR, no obvious murmurs PULM:  Distant breath sounds, faint wheeze, no rales or rhonchi ABD:  NABS, soft, NT MSK:  TTP over the anterior left knee with some swelling but no erythema or induration.  No LEE.  Left ankle without ecchymoses but TTP under medial and lateral malleoli with some swelling.     Data Reviewed: I have personally reviewed following labs and imaging studies  CBC:  Recent Labs Lab 08/28/16 2132 08/30/16 0507  WBC 8.7 7.5  NEUTROABS 7.2  --   HGB 10.4* 9.7*  HCT 34.9* 32.3*  MCV 87.9 90.0  PLT 199 [id number]   Basic Metabolic Panel:  Recent Labs Lab 08/28/16 2132 08/29/16 1549 08/30/16 0507  NA 140 140 140  K 3.5 3.3* 3.3*  CL 93* 91* 91*  CO2 34* 37* 39*  GLUCOSE 110* 163* 117*  BUN 63* 65* 74*  CREATININE 2.26* 2.41* 2.34*  CALCIUM 9.4 8.8* 8.9   GFR: Estimated Creatinine Clearance: 40.1 mL/min (by C-G formula based on SCr of 2.34 mg/dL (H)). Liver Function Tests:  Recent Labs Lab 08/28/16 2132  AST 21  ALT 12*  ALKPHOS 61  BILITOT 1.2  PROT 7.6  ALBUMIN 4.3   No results for input(s): LIPASE, AMYLASE in the last 168 hours. No results for input(s): AMMONIA in the last 168 hours. Coagulation Profile:  Recent Labs Lab 08/30/16 0507  INR 1.08   Cardiac Enzymes: No results for input(s): CKTOTAL, CKMB, CKMBINDEX, TROPONINI in the last 168 hours. BNP (last 3 results) No results for input(s): PROBNP in the last 8760 hours. HbA1C: No results for input(s): HGBA1C in the last 72 hours. CBG:  Recent Labs Lab 08/28/16 1923  GLUCAP 120*   Lipid Profile: No results for input(s): CHOL, HDL, LDLCALC, TRIG, CHOLHDL, LDLDIRECT in the last 72 hours. Thyroid Function Tests: No results for input(s): TSH, T4TOTAL, FREET4, T3FREE,  THYROIDAB in the last 72 hours. Anemia Panel: No results for input(s): VITAMINB12, FOLATE, FERRITIN, TIBC, IRON, RETICCTPCT in the last 72 hours. Urine analysis:    Component Value Date/Time   COLORURINE YELLOW 04/04/2016 1206   APPEARANCEUR HAZY (A) 04/04/2016 1206   LABSPEC 1.011 04/04/2016 1206   PHURINE 6.5 04/04/2016 1206   GLUCOSEU NEGATIVE 04/04/2016 1206   HGBUR NEGATIVE 04/04/2016 1206   BILIRUBINUR NEGATIVE 04/04/2016 1206   KETONESUR NEGATIVE 04/04/2016 1206   PROTEINUR  NEGATIVE 04/04/2016 1206   UROBILINOGEN 1.0 11/16/2009 0554   NITRITE NEGATIVE 04/04/2016 1206   LEUKOCYTESUR NEGATIVE 04/04/2016 1206   Sepsis Labs: @LABRCNTIP (procalcitonin:4,lacticidven:4)  )No results found for this or any previous visit (from the past 240 hour(s)).    Radiology Studies: Dg Chest 2 View  Result Date: 08/28/2016 CLINICAL DATA:  Dyspnea today. EXAM: CHEST  2 VIEW COMPARISON:  04/04/2016 FINDINGS: There is marked cardiomegaly, unchanged. There is mild vascular fullness, unchanged and likely chronic. No airspace consolidation. No effusions. No pneumothorax. Hilar and mediastinal contours are unremarkable and unchanged. IMPRESSION: Stable cardiomegaly.  No consolidation or effusion. Electronically Signed   By: Andreas Newport M.D.   On: 08/28/2016 21:52   Dg Knee Complete 4 Views Left  Result Date: 08/29/2016 CLINICAL DATA:  Pain after fall. EXAM: LEFT KNEE - COMPLETE 4+ VIEW COMPARISON:  None. FINDINGS: There is a moderate suprapatellar joint effusion. Severe tricompartmental degenerative changes are noted. No fractures are seen. IMPRESSION: Moderate suprapatellar effusion with no visualized fracture. Electronically Signed   By: Dorise Bullion III M.D   On: 08/29/2016 01:37     Scheduled Meds: . carvedilol  50 mg Oral BID WC  . diltiazem  180 mg Oral Daily  . enoxaparin (LOVENOX) injection  40 mg Subcutaneous Q24H  . furosemide  80 mg Oral BID  . gabapentin  300 mg Oral BID  .  Influenza vac split quadrivalent PF  0.5 mL Intramuscular Tomorrow-1000  . multivitamin with minerals  1 tablet Oral Daily  . omega-3 acid ethyl esters  1 g Oral Daily  . pneumococcal 23 valent vaccine  0.5 mL Intramuscular Tomorrow-1000  . predniSONE  40 mg Oral Q breakfast  . propafenone  150 mg Oral Q8H  . sodium chloride flush  3 mL Intravenous Q12H  . traMADol  50 mg Oral TID   Continuous Infusions:   LOS: 1 day    Time spent: 30 min    Janece Canterbury, MD [facility] Pager [phone]  If 7PM-7AM, please contact night-coverage [email or web address] Password [email or web address] 08/30/2016, 11:29 AM

## 2016-08-30 NOTE — Progress Notes (Signed)
  Echocardiogram 2D Echocardiogram with Definity has been performed.  Tresa Res 08/30/2016, 1:17 PM

## 2016-08-30 NOTE — Care Management Note (Signed)
Case Management Note  Patient Details  Name: Paige Marshall MRN: [id number] Date of Birth: [birth year]  Subjective/Objective: 60 y/o f admitted w/Acute resp failure. From home.                   Action/Plan:d/c home.   Expected Discharge Date:                  Expected Discharge Plan:  Home/Self Care  In-House Referral:     Discharge planning Services  CM Consult  Post Acute Care Choice:    Choice offered to:     DME Arranged:    DME Agency:     HH Arranged:    HH Agency:     Status of Service:  In process, will continue to follow  If discussed at Long Length of Stay Meetings, dates discussed:    Additional Comments:  Dessa Phi, RN 08/30/2016, 1:08 PM

## 2016-08-31 ENCOUNTER — Other Ambulatory Visit: Payer: Self-pay | Admitting: Physician Assistant

## 2016-08-31 ENCOUNTER — Encounter (HOSPITAL_COMMUNITY): Payer: Self-pay | Admitting: Physician Assistant

## 2016-08-31 ENCOUNTER — Other Ambulatory Visit (HOSPITAL_COMMUNITY): Payer: Medicare Other

## 2016-08-31 ENCOUNTER — Telehealth: Payer: Self-pay | Admitting: Cardiovascular Disease

## 2016-08-31 DIAGNOSIS — N179 Acute kidney failure, unspecified: Secondary | ICD-10-CM

## 2016-08-31 DIAGNOSIS — I272 Pulmonary hypertension, unspecified: Secondary | ICD-10-CM

## 2016-08-31 DIAGNOSIS — N189 Chronic kidney disease, unspecified: Secondary | ICD-10-CM

## 2016-08-31 DIAGNOSIS — E662 Morbid (severe) obesity with alveolar hypoventilation: Secondary | ICD-10-CM

## 2016-08-31 DIAGNOSIS — R55 Syncope and collapse: Secondary | ICD-10-CM

## 2016-08-31 DIAGNOSIS — I50810 Right heart failure, unspecified: Secondary | ICD-10-CM

## 2016-08-31 DIAGNOSIS — I428 Other cardiomyopathies: Secondary | ICD-10-CM

## 2016-08-31 LAB — CBC
HCT: 30.8 % — ABNORMAL LOW (ref 36.0–46.0)
Hemoglobin: 9.3 g/dL — ABNORMAL LOW (ref 12.0–15.0)
MCH: 27 pg (ref 26.0–34.0)
MCHC: 30.2 g/dL (ref 30.0–36.0)
MCV: 89.3 fL (ref 78.0–100.0)
PLATELETS: 172 10*3/uL (ref 150–400)
RBC: 3.45 MIL/uL — ABNORMAL LOW (ref 3.87–5.11)
RDW: 19.1 % — AB (ref 11.5–15.5)
WBC: 8.6 10*3/uL (ref 4.0–10.5)

## 2016-08-31 LAB — BASIC METABOLIC PANEL
Anion gap: 9 (ref 5–15)
BUN: 83 mg/dL — AB (ref 6–20)
CALCIUM: 9 mg/dL (ref 8.9–10.3)
CO2: 39 mmol/L — ABNORMAL HIGH (ref 22–32)
CREATININE: 2.4 mg/dL — AB (ref 0.44–1.00)
Chloride: 90 mmol/L — ABNORMAL LOW (ref 101–111)
GFR calc Af Amer: 24 mL/min — ABNORMAL LOW (ref >60)
GFR, EST NON AFRICAN AMERICAN: 21 mL/min — AB (ref >60)
Glucose, Bld: 126 mg/dL — ABNORMAL HIGH (ref 65–99)
Potassium: 3.7 mmol/L (ref 3.5–5.1)
SODIUM: 138 mmol/L (ref 135–145)

## 2016-08-31 MED ORDER — OXYCODONE HCL 5 MG PO TABS: 5.0000 mg | ORAL_TABLET | Freq: Four times a day (QID) | ORAL | 0 refills | Status: DC | PRN

## 2016-08-31 MED ORDER — DICLOFENAC SODIUM 1 % TD GEL: 4.0000 g | Freq: Four times a day (QID) | TRANSDERMAL | 0 refills | Status: DC

## 2016-08-31 MED ORDER — GABAPENTIN 300 MG PO CAPS: 300.0000 mg | ORAL_CAPSULE | Freq: Every day | ORAL | 0 refills | Status: DC

## 2016-08-31 NOTE — Progress Notes (Signed)
CSW reviewed PT evaluation recommending SNF, but confirmed with RNCM, Juliann Pulse that patient states she will return home at discharge.   No further CSW needs identified - CSW signing off.   Raynaldo Opitz, [facility] Clinical Social Worker cell #: [phone]

## 2016-08-31 NOTE — Telephone Encounter (Signed)
New Message  Pt voiced MD-Berry wanted pt to call for referral to Physical Therapy and an at home nurse to help pt bathe.  Please f/u with pt

## 2016-08-31 NOTE — Evaluation (Addendum)
Physical Therapy Evaluation Patient Details Name: Paige Marshall MRN: [id number] DOB: [birth year] Today's Date: 08/31/2016   History of Present Illness  Paige Marshall is a 60 y.o. female with history of morbid obesity, former tobacco abuse, PAF, OSA noncompliant with CPAP (uses [place] QHS), NICM, chronic combined CHF, normal coronary arteries, HTN, dyslipidemia, CKD stage III-IV (prev baseline CR around 1.6), COPD, suspected obesity-related hypoventilation syndrome, anemia of chronic disease, rheumatoid arthritis who presented to [facility] with syncope, fall  and sustained L distal fibular fx.  Clinical Impression  The patient is requiring extensive assistance of 2 therapists  with sliding board transfer to recliner. The patient  Is declining SNF. Currently unable to get to bathroom/toilet due to NWB and right leg is not stable per patient. The patient has 4 steps to enter and reports she has asked family to build a ramp.  If DC to home soon, needs hospital bed, drop arm BSC(bariatric), sliding board, HHPT. The family should be present during therapy to observe and assist with patient's mobility and assist patient with decision about home vs SNF and caregivers availability. The patient's son has an UE injury per patient and unable to assist. Pt admitted with above diagnosis. Pt currently with functional limitations due to the deficits listed below (see PT Problem List).  Pt will benefit from skilled PT to increase their independence and safety with mobility to allow discharge to the venue listed below.       Follow Up Recommendations SNF;Supervision/Assistance - 24 hour    Equipment Recommendations  Hospital bed;Other (comment) (sliding board, drop arm BSC)    Recommendations for Other Services   OT    Precautions / Restrictions Precautions Precautions: Fall Precaution Comments: monitor VS Required Braces or Orthoses: Other Brace/Splint Other Brace/Splint: CAM boot Restrictions Weight  Bearing Restrictions: Yes LLE Weight Bearing: Non weight bearing  Patient on 3 liters [place]     Mobility  Bed Mobility Overal bed mobility: Needs Assistance Bed Mobility: Supine to Sit     Supine to sit: Mod assist;HOB elevated     General bed mobility comments: assist to move the leg to bed edge and lower to floor, manages trunk and right leg.  Transfers Overall transfer level: Needs assistance   Transfers: Lateral/Scoot Transfers          Lateral/Scoot Transfers: +2 physical assistance;+2 safety/equipment;With slide board;Max assist General transfer comment: with much effort, the patient slid to recliner with ARM rest dropped down. Use of bed pad to slide the patient across.   Ambulation/Gait             General Gait Details: unable due to NWB on LLE , obesity and right knee is not  strong per patient.  Stairs            Wheelchair Mobility    Modified Rankin (Stroke Patients Only)       Balance Overall balance assessment: History of Falls;Needs assistance Sitting-balance support: Feet supported;No upper extremity supported Sitting balance-Leahy Scale: Good Sitting balance - Comments: weight shifts to  have board placed.                                     Pertinent Vitals/Pain Pain Assessment: 0-10 Pain Score: 10-Worst pain ever Pain Location: l leg Pain Descriptors / Indicators: Discomfort;Stabbing;Grimacing;Guarding Pain Intervention(s): Limited activity within patient's tolerance;Monitored during session;Premedicated before session;Repositioned    Home Living Family/patient expects to  be discharged to:: Private residence Living Arrangements: Children Available Help at Discharge: Family;Available 24 hours/day Type of Home: House Home Access: Stairs to enter Entrance Stairs-Rails: Psychiatric nurse of Steps: 4 Home Layout: One level Home Equipment: Walker - 2 wheels;Cane - single point;Electric  scooter;Wheelchair - manual      Prior Function Level of Independence: Independent with assistive device(s)               Hand Dominance        Extremity/Trunk Assessment   Upper Extremity Assessment: Overall WFL for tasks assessed           Lower Extremity Assessment: LLE deficits/detail;RLE deficits/detail RLE Deficits / Details: grossly 4/5, patient states that the left leg was tghe good leg and the right knee has arthritis and needs a TKA. LLE Deficits / Details: requires assist to move the leg to bed edge and ob=ver  Cervical / Trunk Assessment: Normal  Communication   Communication: No difficulties  Cognition Arousal/Alertness: Awake/alert Behavior During Therapy: WFL for tasks assessed/performed Overall Cognitive Status: Within Functional Limits for tasks assessed                      General Comments      Exercises     Assessment/Plan    PT Assessment Patient needs continued PT services  PT Problem List Decreased strength;Decreased activity tolerance;Decreased mobility;Cardiopulmonary status limiting activity;Decreased knowledge of precautions;Decreased safety awareness;Decreased knowledge of use of DME;Pain;Obesity          PT Treatment Interventions DME instruction;Functional mobility training;Therapeutic activities;Therapeutic exercise;Patient/family education    PT Goals (Current goals can be found in the Care Plan section)  Acute Rehab PT Goals Patient Stated Goal: to go home PT Goal Formulation: With patient Time For Goal Achievement: 09/14/16 Potential to Achieve Goals: Good    Frequency Min 5X/week (decr if going to SNF)   Barriers to discharge Decreased caregiver support;Inaccessible home environment the patient reports that she will have a ramp built.    Co-evaluation               End of Session   Activity Tolerance: Patient limited by fatigue Patient left: in chair;with call bell/phone within reach;with  family/visitor present Nurse Communication: Mobility status;Need for lift equipment         Time: [id number] PT Time Calculation (min) (ACUTE ONLY): 32 min   Charges:   PT Evaluation $PT Eval Moderate Complexity: 1 Procedure PT Treatments $Therapeutic Activity: 8-22 mins   PT G Codes:        [name] 08/31/2016, 11:00 AM Tresa Endo PT [phone]

## 2016-08-31 NOTE — Discharge Summary (Addendum)
Physician Discharge Summary  Paige Marshall [id number] DOB: September 11, 1956 DOA: 08/28/2016  PCP: Kerin Perna, NP  Admit date: 08/28/2016 Discharge date: 08/31/2016  Admitted From: home   Disposition:  Home.  (patient declined SNF)  Recommendations for Outpatient Follow-up:  1. Follow up with Dr. Gwenlyn Found in 4-6 weeks 2. Follow up with mid-level in 7 days.   3. 30-day event monitor to be arranged by cardiology.  Will call patient with details 4. Follow up with pulmonology, Dr. Annamaria Boots, to set up repeat sleep study and CPAP as soon as possible 5. Follow up with Dr. Erlinda Hong, orthopedic surgery, in 2-4 weeks regarding left fibular fracture 6. Nephrology referral from PCP  Home Health:  PT/OT/RN/aid  Equipment/Devices:  Hospital bed, BSC, sliding table, oxygen.  Ordered CPAP but unsure if this will be covered since her last sleep study was in 2011  Discharge Condition:  Stable, improved CODE STATUS:  Full code  Diet recommendation:  Healthy heart   Brief/Interim Summary:  60yo female with history of CHF on O2 at night, CAD, htn, hlpd, OSA, paroxysmal atrial fibrillation not on anticoagulation, CKD, presents with concern for shortness of breath, syncope, and hypoxia down to low 80s on room air in the ED. No sign of pneumonia. ECG unchanged. Trop negative. Possible CHF exacerbation as BNP elevated, but no lower extremity edema and not responding to lasix 120mg  IV BID.  D-dimer was elevated and lower extremity duplex negative for DVT.  unable to complete a CT angio due to CKD and patient too claustrophobic for VQ scan.  Has history of PAF and has had some Tarick Parenteau runs of what looks like VT on telemetry but I think this is a-fib with RVR.  She has been asymptomatic during these episodes.  ECHO demonstrated severe right heart dilation and evidence of pulmonary hypertension.  She was placed on her normal diuretic regimen and will need ongoing rehabiliation with physical and occupation therapy, but  I suspect her dyspnea will persist.  Recommend that she use 3L oxygen continuous oxygen.  She will have a 30-day event monitor placed by cardiology and will follow up with Dr. Gwenlyn Found in 4-6 weeks.  Cardiologist felt that her tachyarrhythmias were PVC and did not warrant anticoagulation at this time.    Discharge Diagnoses:  Principal Problem:   Right heart failure, NYHA class 3 Active Problems:   PAF (paroxysmal atrial fibrillation) (HCC)   Morbid obesity (HCC)   Acute on chronic respiratory failure with hypoxia (HCC)   Obesity hypoventilation syndrome (HCC)   COPD mixed type (HCC)   Chronic respiratory failure with hypoxia (HCC)   Acute on chronic combined systolic and diastolic congestive heart failure (HCC)   CKD (chronic kidney disease) stage 4, GFR 15-29 ml/min (HCC)   Elevated d-dimer   Syncope   Acute kidney injury superimposed on CKD (HCC)   NICM (nonischemic cardiomyopathy) ([place])   Pulmonary hypertension  Syncope, suspect this was likely due to hypoxia but could also have been an arrhythmia.  She has likely developed right heart failure due to her chronic left sided heart failure, untreated OSA and ongoing obesity hypoventilation.  She has pulmonary hypertension by ECHO with a severely dilated right ventricle which is new compared to 1-2 years ago.  Her left heart appears somewhat improved with EF up to 50-55%.  She did not respond to diuresis with lasix 120 IV BID and her BUN and creatinine rose slightly.  Her lasix was changed back to 80mg  po BID.  Then tried  treatment for acute COPD, but after ECHO reported, steroids and antibiotics were discontinued.  Although there was concern for possible pulmonary embolism initially, her duplex lower extremities was negative, and acute PE would not have caused the chronic changes seen on her ECHO.  Doubt PE.  CKD stage IV prevented CTa and claustrophobia prevented VQ from being completed to rule out PE.    -  Cardiology consult appreciated -   Continue previous dose fo oral lasix -  Prednisone and doxycycline discontinued. -  Scheduled xopenex -  Telemetry:  Episodes of atrial fibrillation with RVR vs. NSVT -  Plan for 30-day event monitor  Paroxysmal atrial fibrillation, CHADs2vasc = 3 due to CHF, HTN, and female, recommend anticoagulation with warfarin given CKD, however, she had "excessive bleeding" per cardiology notes so a/c was discontinued -  Chronic propafenone and diltiazem -  Followed by Dr. Gwenlyn Found  OSA, not using CPAP, only 2L [place] at night -  Previously followed by Dr. Annamaria Boots, [facility] -  patient slept much better with CPAP  Morbid obesity, has been losing some weight.  Weighed 383lb=174kg 2015, down to 327lbs = 148kg currently -  Encouraged ongoing weight loss but difficult given knee arthritis  Left knee prepatellar effusion -  Start voltaren gel  Left ankle pain -  XR left ankle:  Fibular fracture -  Case discussed with [facility] Erlinda [facility] from [facility].  Recommend nonoperative management due to chronic respiratory failure and dyspnea at rest  -  Splint to left ankle and non weight bearing for 4-6 weeks -  F/u with Dr. Erlinda Hong in 3-4 weeks -  Seen by PT/OT who recommended SNF, however, she declined and preferred to return home.  Has 24h supervision.  CKD stage 4, creatinine 1.6-1.7 in July, currently 2.3-2.4 -  Referral to nephrology as outpatient   Discharge Instructions  Discharge Instructions    ([place]) Call MD:  Anytime you have any of the following symptoms: 1) 3 pound weight gain in 24 hours or 5 pounds in 1 week 2) shortness of breath, with or without a dry hacking cough 3) swelling in the hands, feet or stomach 4) if you have to sleep on extra pillows at night in order to breathe.    Complete by:  As directed    Call MD for:  difficulty breathing, headache or visual disturbances    Complete by:  As directed    Call MD for:  extreme fatigue    Complete by:  As directed    Call MD for:   hives    Complete by:  As directed    Call MD for:  persistant dizziness or light-headedness    Complete by:  As directed    Call MD for:  persistant nausea and vomiting    Complete by:  As directed    Call MD for:  severe uncontrolled pain    Complete by:  As directed    Call MD for:  temperature >100.4    Complete by:  As directed    Diet - low sodium heart healthy    Complete by:  As directed    Discharge instructions    Complete by:  As directed    Use 3L nasal cannula during the day and CPAP (once available) at night.   Driving Restrictions    Complete by:  As directed    No driving or operating heavy machinery.   Increase activity slowly    Complete by:  As directed  Do NOT put weight on your left leg.       Medication List    STOP taking these medications   colchicine 0.6 MG tablet   Potassium Chloride ER 20 MEQ Tbcr   traMADol 50 MG tablet Commonly known as:  ULTRAM     TAKE these medications   albuterol 108 (90 Base) MCG/ACT inhaler Commonly known as:  PROVENTIL HFA;VENTOLIN HFA Inhale 2 puffs into the lungs every 6 (six) hours as needed for wheezing or shortness of breath.   carvedilol 25 MG tablet Commonly known as:  COREG TAKE 2 TABLETS (50 MG TOTAL) BY MOUTH 2 (TWO) TIMES DAILY.   diclofenac sodium 1 % Gel Commonly known as:  VOLTAREN Apply 4 g topically 4 (four) times daily. Left knee   diltiazem 180 MG 24 hr capsule Commonly known as:  DILACOR XR TAKE 1 CAPSULE (180 MG TOTAL) BY MOUTH DAILY.   FISH OIL CONCENTRATE PO Take 1 capsule by mouth daily.   furosemide 80 MG tablet Commonly known as:  LASIX TAKE 1 TABLET (80 MG TOTAL) BY MOUTH 2 (TWO) TIMES DAILY.   gabapentin 300 MG capsule Commonly known as:  NEURONTIN Take 1 capsule (300 mg total) by mouth at bedtime. What changed:  when to take this   multivitamin with minerals Tabs tablet Take 1 tablet by mouth daily.   oxyCODONE 5 MG immediate release tablet Commonly known as:  Oxy  IR/ROXICODONE Take 1 tablet (5 mg total) by mouth every 6 (six) hours as needed for breakthrough pain.   propafenone 150 MG tablet Commonly known as:  RYTHMOL TAKE 1 TABLET (150 MG TOTAL) BY MOUTH EVERY 8 (EIGHT) HOURS.      Follow-up Information    Quay Burow, MD. Schedule an appointment as soon as possible for a visit in 4 week(s).   Specialties:  Cardiology, Radiology Contact information: [address] [place] [address] [place] [address] [phone]        [facility]. Schedule an appointment as soon as possible for a visit in 1 week(s).   Why:  midlevel provider. 30-day event monitor Contact information: [place] [phone]       [facility]. Schedule an appointment as soon as possible for a visit in 2 week(s).   Specialty:  Pulmonology Contact information: [facility] [phone]       EDWARDS, Milford Cage, NP Follow up.   Specialty:  Internal Medicine Why:  as needed Contact information: [place] [address] [phone]        Eduard Roux, MD. Schedule an appointment as soon as possible for a visit in 3 week(s).   Specialty:  Orthopedic Surgery Why:  left ankle fracture  Contact information: [place] [phone]          Allergies  Allergen Reactions  . Penicillins Hives, Itching, Swelling and Rash    Has patient had a PCN reaction causing immediate rash, facial/tongue/throat swelling, SOB or lightheadedness with hypotension:  Has patient had a PCN reaction causing severe rash involving mucus membranes or skin necrosis: Yes Has patient had a PCN reaction that required hospitalization Yes Has patient had a PCN reaction occurring within the last 10 years: No If all of the above answers are "NO", then may proceed with  Cephalosporin use.   Karlyn Agee Hives    Consultations: [facility] Dr. Gwenlyn Found  Procedures/Studies: Dg Chest 2 View  Result Date: 08/28/2016 CLINICAL DATA:  Dyspnea today. EXAM: CHEST  2 VIEW COMPARISON:  04/04/2016 FINDINGS: There is marked cardiomegaly, unchanged. There is mild vascular fullness, unchanged and likely chronic. No airspace consolidation. No effusions. No pneumothorax. Hilar and mediastinal contours are unremarkable and unchanged. IMPRESSION: Stable cardiomegaly.  No consolidation or effusion. Electronically Signed   By: Andreas Newport M.D.   On: 08/28/2016 21:52   Dg Ankle Complete Left  Result Date: 08/30/2016 CLINICAL DATA:  Fall 2 days ago.  Left ankle pain. EXAM: LEFT ANKLE COMPLETE - 3+ VIEW COMPARISON:  None. FINDINGS: There is an oblique fracture through the distal right fibula, minimally displaced. No visible tibial abnormality. Diffuse soft tissue swelling. Ankle mortise appears intact. IMPRESSION: Oblique minimally displaced fracture through the distal left fibula. Electronically Signed   By: Rolm Baptise M.D.   On: 08/30/2016 13:34   Dg Knee Complete 4 Views Left  Result Date: 08/29/2016 CLINICAL DATA:  Pain after fall. EXAM: LEFT KNEE - COMPLETE 4+ VIEW COMPARISON:  None. FINDINGS: There is a moderate suprapatellar joint effusion. Severe tricompartmental degenerative changes are noted. No fractures are seen. IMPRESSION: Moderate suprapatellar effusion with no visualized fracture. Electronically Signed   By: Dorise Bullion III M.D   On: 08/29/2016 01:37    ECHO:  (highlighted findings are new) Left ventricle: There was mild concentric hypertrophy. Systolic   function was normal. The estimated ejection fraction was in the   range of 50% to 55%. - Aortic valve: There was trivial regurgitation. - Right ventricle: The cavity size was severely dilated. - Right atrium: The atrium was severely dilated. - Pulmonary arteries: PA peak pressure: 65 mm Hg (S). -  Pericardium, extracardiac: A trivial pericardial effusion was   identified posterior to the heart.   Subjective: Still SOB but her left ankle feels much better now that splint applied.  Discharge Exam: Vitals:   08/31/16 0846 08/31/16 1500  BP: 122/69 116/61  Pulse:  63  Resp:  18  Temp:  98.2 F (36.8 C)   Vitals:   08/31/16 0436 08/31/16 0846 08/31/16 1037 08/31/16 1500  BP: 128/78 122/69  116/61  Pulse: 72   63  Resp: 18   18  Temp: 97.3 F (36.3 C)   98.2 F (36.8 C)  TempSrc: Oral   Oral  SpO2: 95%  94% 96%  Weight: (!) 149.6 kg (329 lb 12.9 oz)     Height:       Gen: Obese female, mild tachypnea and SCM retractions at rest HEENT: NCAT, MMM CV: RRR, no obvious murmurs PULM: Distant breath sounds, faint wheeze, no rales or rhonchi ABD: NABS, soft, NT MSK: TTP over the anterior left knee with some swelling but no erythema or induration. No LEE. Left lower extremity in splint.     The results of significant diagnostics from this hospitalization (including imaging, microbiology, ancillary and laboratory) are listed below for reference.     Microbiology: No results found for this or any previous visit (from the past 240 hour(s)).   Labs: BNP (last 3 results)  Recent Labs  03/08/16 0029 04/04/16 1055 08/28/16 2132  BNP 47.6 85.7 [id number]*   Basic Metabolic Panel:  Recent Labs Lab 08/28/16 2132 08/29/16 1549 08/30/16 0507 08/31/16 0341  NA 140 140 140 138  K 3.5 3.3* 3.3* 3.7  CL 93* 91* 91* 90*  CO2 34* 37* 39* 39*  GLUCOSE 110* 163* 117* 126*  BUN 63* 65* 74* 83*  CREATININE 2.26* 2.41* 2.34* 2.40*  CALCIUM 9.4 8.8* 8.9 9.0   Liver Function Tests:  Recent Labs Lab 08/28/16 2132  AST 21  ALT 12*  ALKPHOS 61  BILITOT 1.2  PROT 7.6  ALBUMIN 4.3   No results for input(s): LIPASE, AMYLASE in the last 168 hours. No results for input(s): AMMONIA in the last 168 hours. CBC:  Recent Labs Lab 08/28/16 2132 08/30/16 0507 08/31/16 0341   WBC 8.7 7.5 8.6  NEUTROABS 7.2  --   --   HGB 10.4* 9.7* 9.3*  HCT 34.9* 32.3* 30.8*  MCV 87.9 90.0 89.3  PLT 199 213 172   Cardiac Enzymes: No results for input(s): CKTOTAL, CKMB, CKMBINDEX, TROPONINI in the last 168 hours. BNP: Invalid input(s): POCBNP CBG:  Recent Labs Lab 08/28/16 1923  GLUCAP 120*   D-Dimer  Recent Labs  08/29/16 0039  DDIMER 1.63*   Hgb A1c No results for input(s): HGBA1C in the last 72 hours. Lipid Profile No results for input(s): CHOL, HDL, LDLCALC, TRIG, CHOLHDL, LDLDIRECT in the last 72 hours. Thyroid function studies No results for input(s): TSH, T4TOTAL, T3FREE, THYROIDAB in the last 72 hours.  Invalid input(s): FREET3 Anemia work up No results for input(s): VITAMINB12, FOLATE, FERRITIN, TIBC, IRON, RETICCTPCT in the last 72 hours. Urinalysis    Component Value Date/Time   COLORURINE YELLOW 04/04/2016 1206   APPEARANCEUR HAZY (A) 04/04/2016 1206   LABSPEC 1.011 04/04/2016 1206   PHURINE 6.5 04/04/2016 1206   GLUCOSEU NEGATIVE 04/04/2016 1206   HGBUR NEGATIVE 04/04/2016 1206   BILIRUBINUR NEGATIVE 04/04/2016 1206   KETONESUR NEGATIVE 04/04/2016 1206   PROTEINUR NEGATIVE 04/04/2016 1206   UROBILINOGEN 1.0 11/16/2009 0554   NITRITE NEGATIVE 04/04/2016 1206   LEUKOCYTESUR NEGATIVE 04/04/2016 1206   Sepsis Labs Invalid input(s): PROCALCITONIN,  WBC,  LACTICIDVEN   Time coordinating discharge: Over 30 minutes  SIGNED:   Janece Canterbury, MD  [facility] 08/31/2016, 3:08 PM Pager   If 7PM-7AM, please contact night-coverage [email or web address] Password [email or web address]

## 2016-08-31 NOTE — Progress Notes (Signed)
I have sent a message to our [facility] office's scheduler requesting event monitor and follow-up appointments as outlinled by Dr. Gwenlyn Found, and our office will call the patient with this information.

## 2016-08-31 NOTE — Telephone Encounter (Signed)
Spoke w/ patient - she identifies no needs and notes she has already spoken w inpatient nurse who has set her up for [place] for home health. Voiced thanks for call and aware she may speak w [place] if any concerns following her discharge.

## 2016-08-31 NOTE — Care Management Note (Addendum)
Case Management Note  Patient Details  Name: ETNA WIDNER MRN: [id number] Date of Birth: [birth year]  Subjective/Objective:  Patient declines SNF-provided w/HHC agency list-informed of intermittent services-patient voiced understanding. Await choice, & HHPT/OT/social worker, f51f order.  Patient already has private care givers. Home 02-[facility],has travel tank. Patient would like-hospital bed-if MD orders, can arrange. Patient to transp home on own.                  Action/Plan:d/c home w/HHC.   Expected Discharge Date:                  Expected Discharge Plan:  [place]  In-House Referral:     Discharge planning Services  CM Consult  Post Acute Care Choice:  Durable Medical Equipment (home [phone], has travel tank-[employer]) Choice offered to:  Patient  DME Arranged:    DME Agency:     HH Arranged:    [place] Agency:     Status of Service:  In process, will continue to follow  If discussed at Long Length of Stay Meetings, dates discussed:    Additional Comments:  Dessa Phi, RN 08/31/2016, 2:18 PM

## 2016-08-31 NOTE — Care Management Note (Signed)
Case Management Note  Patient Details  Name: Paige Marshall MRN: [id number] Date of Birth: [birth year]  Subjective/Objective: patient chose [facility] -rep Santiago Glad aware of d/c, & HHC orders. [place] dme rep [place]-aware of d/c home today w/hospital bed,slide transfer board,3n1,cpap-hospital bed to be delivered to home prior to patient's d/c-contact person @ home is Jojo Luepke) c#[phone]-[facility] dme rep Larene [place] aware.Nsg aware also to wait until hospital bed in home prior to patient d/c from hospital-Nsg aware. [facility]ed w[facility] fax#[phone]-d/c summary,physician order for cpap,demographic sheet-for sleep study to be set up-they will call patient for appt.Patient will transp home on own.                   Action/Plan:   Expected Discharge Date:                  Expected Discharge Plan:  [place]  In-House Referral:     Discharge planning Services  CM Consult  Post Acute Care Choice:  Durable Medical Equipment (home [phone], has travel tank-[facility]) Choice offered to:  Patient  DME Arranged:  Hospital bed, 3-N-1 DME Agency:  [place]:  RN, PT, OT, Nurse's Aide [facility] Agency:  [place]  Status of Service:  Completed, signed off  If discussed at [place] of Stay Meetings, dates discussed:    Additional Comments:  Dessa Phi, RN 08/31/2016, 3:50 PM

## 2016-08-31 NOTE — Consult Note (Signed)
Cardiology Consultation Note    [name], MRN: [id number], DOB/AGE: 1956-02-02 60 y.o. Admit date: 08/28/2016   Date of Consult: 08/31/2016 Primary Physician: Kerin Perna, NP Primary Cardiologist: Dr. Gwenlyn Found  Chief Complaint: passed out Reason for Consultation: syncope, CHF Requesting MD: Dr. Sheran Fava  HPI: Paige Marshall is a 60 y.o. female with history of morbid obesity, former tobacco abuse, PAF, OSA noncompliant with CPAP (uses 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  To recap hx, LHC 01/2015: normal cors, EF 45-50%. During that admission IM d/w cardiology who agreed to start Eliquis but at f/u with Dr. Gwenlyn Found in 04/2015 this was no longer on med list. Cardiology office note indicates h/o excess bleeding. The patient states she doesn't know anything about this and does not recall being on a blood thinner.  She has since had subsequent hospitalizations for asthma exacerbations, CHF, and AKI on CKD (up to 3.7 in 02/2016). 2D Echo 08/31/16: mild LVH, EF 50-55%, severely dilated RV/RA, PASP 69mmHg.  She states she saw her rheumatologist last week and was felt to be fluid overloaded and advised to f/u Dr. Gwenlyn Found but she had not had the chance to. She was continued on home diuretic regimen and reports compliance with this. On day of admission she was in her usual state of health when she went to sit in her chair. She began vomiting yellow material. She also felt more SOB. She called for her son to bring her a piece of bread. The next thing she knew she was on the floor between the chair and the bed with EMS around her. She denies any preceding chest pain or palpitations. EMS was called and she was hypoxic 79% on RA.   W/u this far reveals LE  duplex neg for DVT. Ortho films reveal oblique minimally displaced fracture through the distal left fibula; moderate suprapatellar effusion with no visualized fracture; CXR with stable cardiomegaly and no consolidation/effusion. Trop neg x 2, Hgb 10.4->9.3 (baseline 8-9), D-dimer 1.63, Cr 2.26->2.4 (BUN 83). Weight 341->329lb (baseline wt 342lb, DC wt 351 in 03/2016) with +467 net OP (unclear if accurate). Unable to a CT angio due to CKD and patient too claustrophobic for VQ scan. Telemetry shows occasional runs of irregularity - occasionally felt to be PVCs, otherwise possible 3-5 beats NSVT vs AF with abberrancy. She reports LEE seems worse than usual. Her breathing is back to baseline per her report.   Past Medical History:  Diagnosis Date  . Arthritis    "both knees" (01/14/2015)  . Asthma   . Chronic combined systolic and diastolic CHF (congestive heart failure) ([place])   . CKD (chronic kidney disease), stage III   . COPD (chronic obstructive pulmonary disease) ([place])   . Degenerative joint disease of both lower legs   . Edema    Lower extremity  . Former smoker   . Gout    hx in "both knees" (01/14/2015)  . High cholesterol   . Hypertension   . Morbid obesity ([place])   . NICM (nonischemic cardiomyopathy) ([place])    a. LHC 01/2015: normal cors, EF 45-50%. b. EF 50-55% in 08/2016.  [name] Nonischemic cardiomyopathy ([place])   . Normal coronary arteries   . OSA (obstructive sleep apnea)    "never sent CPAP out" (01/14/2015)  . Paroxysmal atrial  fibrillation ([place])   . Pneumonia X 2  . Rheumatoid arthritis [facility])       Surgical History:  Past Surgical History:  Procedure Laterality Date  . ANGIOGRAM/LV (CONGENITAL)  2007  . BREATH TEK H PYLORI N/A 12/31/2013   Procedure: [place];  Surgeon: Gayland Curry, MD;  Location: [facility];  Service: General;  Laterality: N/A;  . CORNEAL TRANSPLANT Right ~ 2010  . CORONARY ANGIOPLASTY WITH STENT PLACEMENT  11/04/2005   "1"  . DOPPLER  ECHOCARDIOGRAPHY     2 D   EF of 45%  . EYE SURGERY    . LEFT HEART CATHETERIZATION WITH CORONARY ANGIOGRAM N/A 01/20/2015   Procedure: LEFT HEART CATHETERIZATION WITH CORONARY ANGIOGRAM;  Surgeon: Lorretta Harp, MD;  Location: [facility] CATH LAB;  Service: Cardiovascular;  Laterality: N/A;  . sleep study  2011  . stress myocardial dipyridamole perfusion    . TUBAL LIGATION  1982  . venous duplex ultrasound  2013     Home Meds: Prior to Admission medications   Medication Sig Start Date End Date Taking? Authorizing Provider  albuterol (PROVENTIL HFA;VENTOLIN HFA) 108 (90 Base) MCG/ACT inhaler Inhale 2 puffs into the lungs every 6 (six) hours as needed for wheezing or shortness of breath. 10/24/15  Yes Deneise Lever, MD  carvedilol (COREG) 25 MG tablet TAKE 2 TABLETS (50 MG TOTAL) BY MOUTH 2 (TWO) TIMES DAILY. 05/07/16  Yes Lorretta Harp, MD  colchicine 0.6 MG tablet Take 1 tablet (0.6 mg total) by mouth every other day. Take 1 tablet by mouth every 2 days Patient taking differently: Take 0.6 mg by mouth every other day. Take 1 tablet by mouth every 3 days 04/06/16  Yes Thurnell Lose, MD  diltiazem (DILACOR XR) 180 MG 24 hr capsule TAKE 1 CAPSULE (180 MG TOTAL) BY MOUTH DAILY. 05/07/16  Yes Lorretta Harp, MD  furosemide (LASIX) 80 MG tablet TAKE 1 TABLET (80 MG TOTAL) BY MOUTH 2 (TWO) TIMES DAILY. 05/07/16  Yes Lorretta Harp, MD  gabapentin (NEURONTIN) 300 MG capsule Take 300 mg by mouth 2 (two) times daily. 08/17/16  Yes Historical Provider, MD  Multiple Vitamin (MULTIVITAMIN WITH MINERALS) TABS tablet Take 1 tablet by mouth daily.   Yes Historical Provider, MD  Omega-3 Fatty Acids (FISH OIL CONCENTRATE PO) Take 1 capsule by mouth daily.    Yes Historical Provider, MD  Potassium Chloride ER 20 MEQ TBCR Take 20 mEq by mouth 2 (two) times daily. 05/07/16  Yes Lorretta Harp, MD  propafenone (RYTHMOL) 150 MG tablet TAKE 1 TABLET (150 MG TOTAL) BY MOUTH EVERY 8 (EIGHT) HOURS. 08/17/16  Yes  Pixie Casino, MD  traMADol (ULTRAM) 50 MG tablet Take 50 mg by mouth 3 (three) times daily. 08/17/16  Yes Historical Provider, MD    Inpatient Medications:  . carvedilol  50 mg Oral BID WC  . diclofenac sodium  4 g Topical QID  . diltiazem  180 mg Oral Daily  . doxycycline  100 mg Oral Q12H  . enoxaparin (LOVENOX) injection  40 mg Subcutaneous Q24H  . furosemide  80 mg Oral BID  . gabapentin  300 mg Oral QHS  . levalbuterol  1.25 mg Nebulization BID  . multivitamin with minerals  1 tablet Oral Daily  . omega-3 acid ethyl esters  1 g Oral Daily  . predniSONE  40 mg Oral Q breakfast  . propafenone  150 mg Oral Q8H  . sodium chloride flush  3 mL Intravenous Q12H     Allergies:  Allergies  Allergen Reactions  . Penicillins Hives, Itching, Swelling and Rash    Has patient had a PCN reaction causing immediate rash, facial/tongue/throat swelling, SOB or lightheadedness with hypotension:  Has patient had a PCN reaction causing severe rash involving mucus membranes or skin necrosis: Yes Has patient had a PCN reaction that required hospitalization Yes Has patient had a PCN reaction occurring within the last 10 years: No If all of the above answers are "NO", then may proceed with Cephalosporin use.   . Citrus Hives    Social History   Social History  . Marital status: Single    Spouse name: N/A  . Number of children: N/A  . Years of education: N/A   Occupational History  . Not on file.   Social History Main Topics  . Smoking status: Former Smoker    Packs/day: 0.33    Years: 35.00    Types: Cigarettes    Quit date: 10/11/2008  . Smokeless tobacco: Never Used  . Alcohol use 4.8 oz/week    8 Cans of beer per week     Comment: 01/14/2015 "~ 4, 24oz cans beer/wk"  . Drug use: No  . Sexual activity: No   Other Topics Concern  . Not on file   Social History Narrative  . No narrative on file     Family History  Problem Relation Age of Onset  . Heart disease Mother   .  Cancer Father     colon  . Hypertension Other      Review of Systemssee above. No fevers, chills, chest pain. All other systems reviewed and are otherwise negative except as noted above.  Labs: No results for input(s): CKTOTAL, CKMB, TROPONINI in the last 72 hours. Lab Results  Component Value Date   WBC 8.6 08/31/2016   HGB 9.3 (L) 08/31/2016   HCT 30.8 (L) 08/31/2016   MCV 89.3 08/31/2016   PLT 172 08/31/2016    Recent Labs Lab 08/28/16 2132  08/31/16 0341  NA 140  < > 138  K 3.5  < > 3.7  CL 93*  < > 90*  CO2 34*  < > 39*  BUN 63*  < > 83*  CREATININE 2.26*  < > 2.40*  CALCIUM 9.4  < > 9.0  PROT 7.6  --   --   BILITOT 1.2  --   --   ALKPHOS 61  --   --   ALT 12*  --   --   AST 21  --   --   GLUCOSE 110*  < > 126*  < > = values in this interval not displayed. Lab Results  Component Value Date   CHOL 167 01/15/2015   HDL 35 (L) 01/15/2015   LDLCALC 93 01/15/2015   TRIG 194 (H) 01/15/2015   Lab Results  Component Value Date   DDIMER 1.63 (H) 08/29/2016    Radiology/Studies:  Dg Chest 2 View  Result Date: 08/28/2016 CLINICAL DATA:  Dyspnea today. EXAM: CHEST  2 VIEW COMPARISON:  04/04/2016 FINDINGS: There is marked cardiomegaly, unchanged. There is mild vascular fullness, unchanged and likely chronic. No airspace consolidation. No effusions. No pneumothorax. Hilar and mediastinal contours are unremarkable and unchanged. IMPRESSION: Stable cardiomegaly.  No consolidation or effusion. Electronically Signed   By: Andreas Newport M.D.   On: 08/28/2016 21:52   Dg Ankle Complete Left  Result Date: 08/30/2016 CLINICAL DATA:  Fall 2 days  ago.  Left ankle pain. EXAM: LEFT ANKLE COMPLETE - 3+ VIEW COMPARISON:  None. FINDINGS: There is an oblique fracture through the distal right fibula, minimally displaced. No visible tibial abnormality. Diffuse soft tissue swelling. Ankle mortise appears intact. IMPRESSION: Oblique minimally displaced fracture through the distal left  fibula. Electronically Signed   By: Rolm Baptise M.D.   On: 08/30/2016 13:34   Dg Knee Complete 4 Views Left  Result Date: 08/29/2016 CLINICAL DATA:  Pain after fall. EXAM: LEFT KNEE - COMPLETE 4+ VIEW COMPARISON:  None. FINDINGS: There is a moderate suprapatellar joint effusion. Severe tricompartmental degenerative changes are noted. No fractures are seen. IMPRESSION: Moderate suprapatellar effusion with no visualized fracture. Electronically Signed   By: Dorise Bullion III M.D   On: 08/29/2016 01:37    Wt Readings from Last 3 Encounters:  08/31/16 (!) 329 lb 12.9 oz (149.6 kg)  05/07/16 (!) 342 lb (155.1 kg)  04/06/16 (!) 351 lb 1.6 oz (159.3 kg)    EKG: 68bpm NSIVCD (LBBB pattern) nonspecific St-T changes unchanged from prior, QTC 477  Physical Exam: Blood pressure 122/69, pulse 72, temperature 97.3 F (36.3 C), temperature source Oral, resp. rate 18, height 5' 7.5" (1.715 m), weight (!) 329 lb 12.9 oz (149.6 kg), last menstrual period 09/07/2011, SpO2 95 %. Body mass index is 50.89 kg/m. General: Morbidly obese AAF in no acute distress. Head: Normocephalic, atraumatic, sclera non-icteric, no xanthomas, nares are without discharge.  Neck: Negative for carotid bruits. Thick neck thus unable to accurately assess JVD Lungs: Diminished BS throughout. Breathing is unlabored on [place] Heart: RRR with S1 S2. No murmurs, rubs, or gallops appreciated. Abdomen: Soft, non-tender, non-distended with normoactive bowel sounds. No hepatomegaly. No rebound/guarding. No obvious abdominal masses. Msk:  Strength and tone appear normal for age. Extremities: No clubbing or cyanosis. Large baseline leg habitus, trace-1+ BL sockline edema - again, difficult to discern what is weight and what is fluid. Distal pedal pulses are 2+ and equal bilaterally. Neuro: Alert and oriented X 3. No facial asymmetry. No focal deficit. Moves all extremities spontaneously. Psych:  Responds to questions appropriately with a  normal affect.     Assessment and Plan   60 y.o. 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  1. Syncope - etiology not totally clear. Did not sound orthostatic in nature. Could have been related to hypoxia, arrhythmia (ventricular or pauses), pre-load shifts in setting of right heart dysfunction, or related to her pulmonary hypertension. Will review further with MD. Given her pulmonary HTN if IM is able to proceed with VQ scan with something to help her claustrophobia, this may be helpful to exclude PE.  2. Acute on chronic respiratory failure - multifactorial due to OHS/OSA, pulmonary HTN, asthma, COPD and CHF. Suspect she will need chronic O2 following this admission. Ultimately she needs to be compliant with CPAP otherwise this is likely to spiral downward further. The patient states she used CPAP last night and tolerated it, thus is willing to consider at home.  3. Chronic combined CHF with RHF - most recent LV function improved. Weight is now down almost 15-20lb from baseline weight with rising Cr. Volume status is impossible to assess given her severe  morbid obesity with BMI>50. Will review diuretic regimen with MD who follows patient as OP - her labs actually reveal intravascular depletion at this time.  4. Paroxysmal atrial fib - telemetry reveals mostly NSR, intermittent PACs, brief runs of wider complex rhythm up to 5 beats - question NSVT versus AF with abberrancy. CHADSVASC is 3. Will need formal input from Dr. Gwenlyn Found regarding anticoagulation (see HPI).  5. Chronic anemia - per IM.  6. AKI on CKD stage III-IV - recent worsening per IM.  May need to consider renal eval.  Signed, Charlie Pitter PA-C 08/31/2016, 9:14 AM Pager: [phone]  Agree with findings by Melina Copa PA-C  Paige Marshall is well-known to me. I performed cardiac catheterization on her several times in the past most recently last year revealing normal coronary arteries with mild LV dysfunction. She has morbid obesity and hypoventilation syndrome on CPAP but home. She was admitted with syncope. She did fracture her fibula. She was volume overloaded and has diuresed. She appears intravascularly dry with elevated BUN out of proportion to her creatinine. Her rhythm is sinus rhythm with PVCs. I do not see any evidence of PAF. Her breathing has improved. Her echo does show fairly normal LV function without large dilated RV and pulmonary hypertension consistent with obesity hypoventilation syndrome. Her d-dimer is mildly elevated although I do not suspect pulmonary embolism. Her peripheral edema has somewhat improved. She probably would benefit from having a 30 day event monitor to rule out an arrhythmogenic cause to her syncope. Otherwise, I would put her back on her home diuretic dose, follow her renal function. She will need a TOC 7 mid-level provider follow-up I will see her back in 4-6 weeks.  Lorretta Harp, M.D., [place], [facility], [place] [address] [place], [place]  [address]  [phone] 08/31/2016 11:47 AM

## 2016-08-31 NOTE — Progress Notes (Signed)
Physical Therapy Treatment Patient Details Name: Paige Marshall MRN: [id number] DOB: August 24, 1956 Today's Date: 08/31/2016    History of Present Illness Paige Marshall is a 60 y.o. 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    PT Comments    Requires 2 person max assist to slide from recliner to bed. Patient will benefit from SNF to improve in mobility.   Follow Up Recommendations  SNF;Supervision/Assistance - 24 hour     Equipment Recommendations  Hospital bed;Other (comment)    Recommendations for Other Services       Precautions / Restrictions Precautions Precautions: Fall Precaution Comments: monitor VS Required Braces or Orthoses: Other Brace/Splint Other Brace/Splint: CAM boot Restrictions Weight Bearing Restrictions: Yes LLE Weight Bearing: Non weight bearing    Mobility  Bed Mobility Overal bed mobility: Needs Assistance Bed Mobility: Sit to Supine     Supine to sit: Mod assist;HOB elevated Sit to supine: Mod assist   General bed mobility comments: assist with left leg placed onto bed.  Transfers Overall transfer level: Needs assistance   Transfers: Lateral/Scoot Transfers          Lateral/Scoot Transfers: +2 physical assistance;+2 safety/equipment;With slide board;Max assist General transfer comment: with much effort, the patient slid to from recliner to bed wiith ARM rest dropped down. Use of bed pad to slide the patient across.   Ambulation/Gait             General Gait Details: unable due to NWB on LLE , obesity and right knee is not  strong per  patient.   Stairs            Wheelchair Mobility    Modified Rankin (Stroke Patients Only)       Balance Overall balance assessment: History of Falls;Needs assistance Sitting-balance support: Feet supported;No upper extremity supported Sitting balance-Leahy Scale: Good Sitting balance - Comments: weight shifts to  have board placed.                            Cognition Arousal/Alertness: Awake/alert Behavior During Therapy: WFL for tasks assessed/performed Overall Cognitive Status: Within Functional Limits for tasks assessed                      Exercises      General Comments        Pertinent Vitals/Pain Pain Assessment: 0-10 Pain Score: 10-Worst pain ever Pain Location: Left leg Pain Descriptors / Indicators: Cramping;Discomfort;Grimacing;Guarding Pain Intervention(s): Repositioned;Monitored during session;Premedicated before session    [place] expects to be discharged to:: Private residence Living Arrangements: Children Available Help at Discharge: Family;Available 24 hours/day Type of Home: House Home Access: Stairs to enter Entrance Stairs-Rails: Right;Left Home Layout: One level Home Equipment: Environmental consultant - 2 wheels;Cane - single point;Electric scooter;Wheelchair - manual      Prior Function Level of Independence: Independent with assistive device(s)          PT Goals (current goals can now be found in the care plan section) Acute Rehab PT Goals Patient Stated Goal: to go home PT Goal Formulation: With patient Time For Goal Achievement: 09/14/16 Potential to Achieve Goals: Good  Progress towards PT goals: Progressing toward goals    Frequency    Min 5X/week      PT Plan Current plan remains appropriate (decr if SNF)    Co-evaluation             End of Session   Activity Tolerance: Patient limited by pain Patient left: in bed;with call bell/phone within reach;with bed alarm set;with  family/visitor present     Time: [id number] PT Time Calculation (min) (ACUTE ONLY): 20 min  Charges:  $Therapeutic Activity: 8-22 mins                    G Codes:      [name] 08/31/2016, 11:10 AM

## 2016-09-01 ENCOUNTER — Other Ambulatory Visit (HOSPITAL_COMMUNITY): Payer: Medicare Other

## 2016-09-01 DIAGNOSIS — J9621 Acute and chronic respiratory failure with hypoxia: Secondary | ICD-10-CM

## 2016-09-01 LAB — CBC
HCT: 28.1 % — ABNORMAL LOW (ref 36.0–46.0)
HCT: 29.1 % — ABNORMAL LOW (ref 36.0–46.0)
HEMOGLOBIN: 8.8 g/dL — AB (ref 12.0–15.0)
Hemoglobin: 8.5 g/dL — ABNORMAL LOW (ref 12.0–15.0)
MCH: 26.7 pg (ref 26.0–34.0)
MCH: 26.3 pg (ref 26.0–34.0)
MCHC: 30.2 g/dL (ref 30.0–36.0)
MCHC: 30.2 g/dL (ref 30.0–36.0)
MCV: 88.4 fL (ref 78.0–100.0)
MCV: 86.9 fL (ref 78.0–100.0)
PLATELETS: 167 10*3/uL (ref 150–400)
Platelets: 180 10*3/uL (ref 150–400)
RBC: 3.18 MIL/uL — AB (ref 3.87–5.11)
RBC: 3.35 MIL/uL — AB (ref 3.87–5.11)
RDW: 18.4 % — AB (ref 11.5–15.5)
RDW: 18.1 % — ABNORMAL HIGH (ref 11.5–15.5)
WBC: 7 10*3/uL (ref 4.0–10.5)
WBC: 6.2 10*3/uL (ref 4.0–10.5)

## 2016-09-01 LAB — BASIC METABOLIC PANEL
Anion gap: 11 (ref 5–15)
BUN: 93 mg/dL — AB (ref 6–20)
CO2: 37 mmol/L — ABNORMAL HIGH (ref 22–32)
CREATININE: 2.55 mg/dL — AB (ref 0.44–1.00)
Calcium: 9 mg/dL (ref 8.9–10.3)
Chloride: 90 mmol/L — ABNORMAL LOW (ref 101–111)
GFR, EST AFRICAN AMERICAN: 23 mL/min — AB (ref >60)
GFR, EST NON AFRICAN AMERICAN: 20 mL/min — AB (ref >60)
Glucose, Bld: 120 mg/dL — ABNORMAL HIGH (ref 65–99)
POTASSIUM: 3.1 mmol/L — AB (ref 3.5–5.1)
SODIUM: 138 mmol/L (ref 135–145)

## 2016-09-01 MED ORDER — POTASSIUM CHLORIDE CRYS ER 20 MEQ PO TBCR: 40.0000 meq | EXTENDED_RELEASE_TABLET | Freq: Two times a day (BID) | ORAL | Status: DC

## 2016-09-01 MED ORDER — POTASSIUM CHLORIDE CRYS ER 20 MEQ PO TBCR
60.0000 meq | EXTENDED_RELEASE_TABLET | Freq: Once | ORAL | Status: AC
  Administered 2016-09-01: 60 meq via ORAL
  Filled 2016-09-01: qty 3

## 2016-09-01 NOTE — Progress Notes (Signed)
[facility] call and Delfina Redwood pt's son, bed will be delivered at [facility].

## 2016-09-01 NOTE — Progress Notes (Signed)
Patient discharge education complete. Patient prefers to be discharged home and wait on the bed there. Patients soon will be here to transport patient home at 3pm. Will continue to monitor.

## 2016-09-01 NOTE — Progress Notes (Signed)
OT Cancellation Note  Patient Details Name: Paige Marshall MRN: [id number] DOB: [birth year]   Cancelled Treatment:    Reason Eval/Treat Not Completed: Other (comment).  Noted that pt is being discharged home this afternoon. Will defer OT evaluation to [facility].  [name] 09/01/2016, 3:09 PM  Lesle Chris, OTR/L [phone] 09/01/2016

## 2016-09-01 NOTE — Progress Notes (Signed)
Night RN reported patient was not discharged home last night due to bed not being delivered to patients home.

## 2016-09-01 NOTE — Progress Notes (Signed)
PROGRESS NOTE    Paige Marshall  [id number] DOB: 09-Jul-1956 DOA: 08/28/2016 PCP: Kerin Perna, NP    Assessment & Plan:   Principal Problem:   Right heart failure, NYHA class 3 Active Problems:   PAF (paroxysmal atrial fibrillation) (HCC)   Morbid obesity (HCC)   Acute on chronic respiratory failure with hypoxia (HCC)   Obesity hypoventilation syndrome (HCC)   COPD mixed type (HCC)   Chronic respiratory failure with hypoxia (HCC)   Acute on chronic combined systolic and diastolic congestive heart failure (HCC)   CKD (chronic kidney disease) stage 4, GFR 15-29 ml/min (HCC)   Elevated d-dimer   Syncope   Acute kidney injury superimposed on CKD ([place])   NICM (nonischemic cardiomyopathy) ([place])   Pulmonary hypertension  Syncope,   - Was originally planned for discharge for 11/21, however home hospital bed not ready yet, thus discharge delayed until today - Cardiology consult appreciated - Continue previous dose fo oral lasix - Prednisone and doxycycline discontinued. - Scheduled xopenex - Telemetry:  Episodes of atrial fibrillation with RVR vs. NSVT -  Plan for 30-day event monitor  Paroxysmal atrial fibrillation, CHADs2vasc = 3 due to CHF, HTN, and female, recommend anticoagulation with warfarin given CKD, however, she had "excessive bleeding" per cardiology notes so a/c was discontinued - Chronic propafenone and diltiazem - Followed by Dr. Gwenlyn Found  OSA, not using CPAP, only 2L [place] at night - Previously followed by Dr. Annamaria Boots, [facility] - patient slept much better with CPAP  Morbid obesity, has been losing some weight. Weighed 383lb=174kg 2015, down to 327lbs = 148kg currently - Encouraged ongoing weight loss but difficult given knee arthritis  Left knee prepatellar effusion - Start voltaren gel  Left ankle pain - XR left ankle:  Fibular fracture -  Case discussed with Dr. Erlinda Hong from [facility].  Recommend nonoperative management due to  chronic respiratory failure and dyspnea at rest  - Splint to left ankle and non weight bearing for 4-6 weeks -  F/u with Dr. Erlinda Hong in 3-4 weeks -  Seen by PT/OT who recommended SNF, however, she declined and preferred to return home.  Has 24h supervision.  CKD stage 4, creatinine 1.6-1.7 in July, currently 2.3-2.5 - Referral to nephrology as outpatient  DVT prophylaxis: Lovenox Disposition Plan: Discharge today  Consultants:   Cardiology  Procedures:     Antimicrobials: Anti-infectives    Start     Dose/Rate Route Frequency Ordered Stop   08/30/16 1230  doxycycline (VIBRA-TABS) tablet 100 mg     100 mg Oral Every 12 hours 08/30/16 1150 09/02/16 0959      Subjective: No complaints  Objective: Vitals:   09/01/16 0606 09/01/16 0635 09/01/16 0856 09/01/16 0932  BP: 120/78  120/60   Pulse: 65  63   Resp: 18     Temp: 97.3 F (36.3 C)     TempSrc: Oral     SpO2: 97%   92%  Weight:  (!) 155 kg (341 lb 11.4 oz)    Height:        Intake/Output Summary (Last 24 hours) at 09/01/16 1632 Last data filed at 09/01/16 1500  Gross per 24 hour  Intake              840 ml  Output             1350 ml  Net             -510 ml   [employer]  08/30/16 0503 08/31/16 0436 09/01/16 0635  Weight: (!) 148.4 kg (327 lb 2.6 oz) (!) 149.6 kg (329 lb 12.9 oz) (!) 155 kg (341 lb 11.4 oz)    Examination:  General exam: Appears calm and comfortable  Respiratory system: Clear to auscultation. Respiratory effort normal. Cardiovascular system: S1 & S2 heard, RRR. No JVD, murmurs, rubs, gallops or clicks. No pedal edema. Gastrointestinal system: Abdomen is nondistended, soft and nontender. No organomegaly or masses felt. Normal bowel sounds heard. Central nervous system: Alert and oriented. No focal neurological deficits. Extremities: Symmetric 5 x 5 power. Skin: No rashes, lesions or ulcers Psychiatry: Judgement and insight appear normal. Mood & affect appropriate.   Data Reviewed: I  have personally reviewed following labs and imaging studies  CBC:  Recent Labs Lab 08/28/16 2132 08/30/16 0507 08/31/16 0341 09/01/16 0323 09/01/16 0921  WBC 8.7 7.5 8.6 7.0 6.2  NEUTROABS 7.2  --   --   --   --   HGB 10.4* 9.7* 9.3* 8.5* 8.8*  HCT 34.9* 32.3* 30.8* 28.1* 29.1*  MCV 87.9 90.0 89.3 88.4 86.9  PLT 199 213 172 167 [id number]   Basic Metabolic Panel:  Recent Labs Lab 08/28/16 2132 08/29/16 1549 08/30/16 0507 08/31/16 0341 09/01/16 0323  NA 140 140 140 138 138  K 3.5 3.3* 3.3* 3.7 3.1*  CL 93* 91* 91* 90* 90*  CO2 34* 37* 39* 39* 37*  GLUCOSE 110* 163* 117* 126* 120*  BUN 63* 65* 74* 83* 93*  CREATININE 2.26* 2.41* 2.34* 2.40* 2.55*  CALCIUM 9.4 8.8* 8.9 9.0 9.0   GFR: Estimated Creatinine Clearance: 37.8 mL/min (by C-G formula based on SCr of 2.55 mg/dL (H)). Liver Function Tests:  Recent Labs Lab 08/28/16 2132  AST 21  ALT 12*  ALKPHOS 61  BILITOT 1.2  PROT 7.6  ALBUMIN 4.3   No results for input(s): LIPASE, AMYLASE in the last 168 hours. No results for input(s): AMMONIA in the last 168 hours. Coagulation Profile:  Recent Labs Lab 08/30/16 0507  INR 1.08   Cardiac Enzymes: No results for input(s): CKTOTAL, CKMB, CKMBINDEX, TROPONINI in the last 168 hours. BNP (last 3 results) No results for input(s): PROBNP in the last 8760 hours. HbA1C: No results for input(s): HGBA1C in the last 72 hours. CBG:  Recent Labs Lab 08/28/16 1923  GLUCAP 120*   Lipid Profile: No results for input(s): CHOL, HDL, LDLCALC, TRIG, CHOLHDL, LDLDIRECT in the last 72 hours. Thyroid Function Tests: No results for input(s): TSH, T4TOTAL, FREET4, T3FREE, THYROIDAB in the last 72 hours. Anemia Panel: No results for input(s): VITAMINB12, FOLATE, FERRITIN, TIBC, IRON, RETICCTPCT in the last 72 hours. Sepsis Labs: No results for input(s): PROCALCITON, LATICACIDVEN in the last 168 hours.  No results found for this or any previous visit (from the past 240 hour(s)).     Radiology Studies: No results found.  Scheduled Meds: . carvedilol  50 mg Oral BID WC  . diclofenac sodium  4 g Topical QID  . diltiazem  180 mg Oral Daily  . doxycycline  100 mg Oral Q12H  . enoxaparin (LOVENOX) injection  40 mg Subcutaneous Q24H  . furosemide  80 mg Oral BID  . gabapentin  300 mg Oral QHS  . levalbuterol  1.25 mg Nebulization BID  . multivitamin with minerals  1 tablet Oral Daily  . omega-3 acid ethyl esters  1 g Oral Daily  . predniSONE  40 mg Oral Q breakfast  . propafenone  150 mg Oral Q8H  .  sodium chloride flush  3 mL Intravenous Q12H   Continuous Infusions:   LOS: 3 days   Paige Marshall, Orpah Melter, MD [facility] Pager [phone]  If 7PM-7AM, please contact night-coverage [email or web address] Password [facility] 09/01/2016, 4:32 PM

## 2016-09-01 NOTE — Progress Notes (Signed)
[facility] called concerning pt's bed. Spoke with [place] and [place] concerning delivered. Son Delfina Redwood states that the bed is not delivered, may not be delivered until tomorrow.

## 2016-09-03 DIAGNOSIS — I2510 Atherosclerotic heart disease of native coronary artery without angina pectoris: Secondary | ICD-10-CM | POA: Diagnosis not present

## 2016-09-03 DIAGNOSIS — Z87891 Personal history of nicotine dependence: Secondary | ICD-10-CM | POA: Diagnosis not present

## 2016-09-03 DIAGNOSIS — N184 Chronic kidney disease, stage 4 (severe): Secondary | ICD-10-CM | POA: Diagnosis not present

## 2016-09-03 DIAGNOSIS — M170 Bilateral primary osteoarthritis of knee: Secondary | ICD-10-CM | POA: Diagnosis not present

## 2016-09-03 DIAGNOSIS — J45909 Unspecified asthma, uncomplicated: Secondary | ICD-10-CM | POA: Diagnosis not present

## 2016-09-03 DIAGNOSIS — J449 Chronic obstructive pulmonary disease, unspecified: Secondary | ICD-10-CM | POA: Diagnosis not present

## 2016-09-03 DIAGNOSIS — I5043 Acute on chronic combined systolic (congestive) and diastolic (congestive) heart failure: Secondary | ICD-10-CM | POA: Diagnosis not present

## 2016-09-03 DIAGNOSIS — I130 Hypertensive heart and chronic kidney disease with heart failure and stage 1 through stage 4 chronic kidney disease, or unspecified chronic kidney disease: Secondary | ICD-10-CM | POA: Diagnosis not present

## 2016-09-03 DIAGNOSIS — E662 Morbid (severe) obesity with alveolar hypoventilation: Secondary | ICD-10-CM | POA: Diagnosis not present

## 2016-09-03 DIAGNOSIS — I429 Cardiomyopathy, unspecified: Secondary | ICD-10-CM | POA: Diagnosis not present

## 2016-09-03 DIAGNOSIS — S72402D Unspecified fracture of lower end of left femur, subsequent encounter for closed fracture with routine healing: Secondary | ICD-10-CM | POA: Diagnosis not present

## 2016-09-03 DIAGNOSIS — E785 Hyperlipidemia, unspecified: Secondary | ICD-10-CM | POA: Diagnosis not present

## 2016-09-05 ENCOUNTER — Inpatient Hospital Stay (HOSPITAL_COMMUNITY)
Admission: EM | Admit: 2016-09-05 | Discharge: 2016-09-14 | DRG: 291 | Disposition: A | Discharge status: Skilled Nursing Facility | Payer: Medicare Other | Attending: Internal Medicine | Admitting: Internal Medicine

## 2016-09-05 ENCOUNTER — Emergency Department (HOSPITAL_COMMUNITY): Payer: Medicare Other

## 2016-09-05 ENCOUNTER — Encounter (HOSPITAL_COMMUNITY): Payer: Self-pay | Admitting: Emergency Medicine

## 2016-09-05 DIAGNOSIS — I110 Hypertensive heart disease with heart failure: Secondary | ICD-10-CM | POA: Diagnosis not present

## 2016-09-05 DIAGNOSIS — M069 Rheumatoid arthritis, unspecified: Secondary | ICD-10-CM | POA: Diagnosis present

## 2016-09-05 DIAGNOSIS — G4733 Obstructive sleep apnea (adult) (pediatric): Secondary | ICD-10-CM | POA: Diagnosis present

## 2016-09-05 DIAGNOSIS — I130 Hypertensive heart and chronic kidney disease with heart failure and stage 1 through stage 4 chronic kidney disease, or unspecified chronic kidney disease: Secondary | ICD-10-CM | POA: Diagnosis not present

## 2016-09-05 DIAGNOSIS — I429 Cardiomyopathy, unspecified: Secondary | ICD-10-CM | POA: Diagnosis present

## 2016-09-05 DIAGNOSIS — I2510 Atherosclerotic heart disease of native coronary artery without angina pectoris: Secondary | ICD-10-CM | POA: Diagnosis present

## 2016-09-05 DIAGNOSIS — I5043 Acute on chronic combined systolic (congestive) and diastolic (congestive) heart failure: Secondary | ICD-10-CM | POA: Diagnosis not present

## 2016-09-05 DIAGNOSIS — D631 Anemia in chronic kidney disease: Secondary | ICD-10-CM | POA: Diagnosis present

## 2016-09-05 DIAGNOSIS — R531 Weakness: Secondary | ICD-10-CM | POA: Diagnosis not present

## 2016-09-05 DIAGNOSIS — Z880 Allergy status to penicillin: Secondary | ICD-10-CM

## 2016-09-05 DIAGNOSIS — S82402A Unspecified fracture of shaft of left fibula, initial encounter for closed fracture: Secondary | ICD-10-CM | POA: Diagnosis present

## 2016-09-05 DIAGNOSIS — N184 Chronic kidney disease, stage 4 (severe): Secondary | ICD-10-CM | POA: Diagnosis present

## 2016-09-05 DIAGNOSIS — J9620 Acute and chronic respiratory failure, unspecified whether with hypoxia or hypercapnia: Secondary | ICD-10-CM | POA: Diagnosis present

## 2016-09-05 DIAGNOSIS — R0602 Shortness of breath: Secondary | ICD-10-CM | POA: Diagnosis not present

## 2016-09-05 DIAGNOSIS — I5022 Chronic systolic (congestive) heart failure: Secondary | ICD-10-CM | POA: Diagnosis present

## 2016-09-05 DIAGNOSIS — J9621 Acute and chronic respiratory failure with hypoxia: Secondary | ICD-10-CM | POA: Diagnosis present

## 2016-09-05 DIAGNOSIS — M109 Gout, unspecified: Secondary | ICD-10-CM | POA: Diagnosis present

## 2016-09-05 DIAGNOSIS — J449 Chronic obstructive pulmonary disease, unspecified: Secondary | ICD-10-CM | POA: Diagnosis present

## 2016-09-05 DIAGNOSIS — Z87891 Personal history of nicotine dependence: Secondary | ICD-10-CM

## 2016-09-05 DIAGNOSIS — Z947 Corneal transplant status: Secondary | ICD-10-CM

## 2016-09-05 DIAGNOSIS — Z79891 Long term (current) use of opiate analgesic: Secondary | ICD-10-CM

## 2016-09-05 DIAGNOSIS — M170 Bilateral primary osteoarthritis of knee: Secondary | ICD-10-CM | POA: Diagnosis present

## 2016-09-05 DIAGNOSIS — Z7401 Bed confinement status: Secondary | ICD-10-CM

## 2016-09-05 DIAGNOSIS — K5900 Constipation, unspecified: Secondary | ICD-10-CM | POA: Diagnosis not present

## 2016-09-05 DIAGNOSIS — Z8673 Personal history of transient ischemic attack (TIA), and cerebral infarction without residual deficits: Secondary | ICD-10-CM

## 2016-09-05 DIAGNOSIS — I5033 Acute on chronic diastolic (congestive) heart failure: Secondary | ICD-10-CM | POA: Diagnosis present

## 2016-09-05 DIAGNOSIS — E7800 Pure hypercholesterolemia, unspecified: Secondary | ICD-10-CM | POA: Diagnosis present

## 2016-09-05 DIAGNOSIS — N189 Chronic kidney disease, unspecified: Secondary | ICD-10-CM

## 2016-09-05 DIAGNOSIS — N179 Acute kidney failure, unspecified: Secondary | ICD-10-CM | POA: Diagnosis not present

## 2016-09-05 DIAGNOSIS — D649 Anemia, unspecified: Secondary | ICD-10-CM | POA: Diagnosis present

## 2016-09-05 DIAGNOSIS — R404 Transient alteration of awareness: Secondary | ICD-10-CM | POA: Diagnosis not present

## 2016-09-05 DIAGNOSIS — E662 Morbid (severe) obesity with alveolar hypoventilation: Secondary | ICD-10-CM | POA: Diagnosis present

## 2016-09-05 DIAGNOSIS — I10 Essential (primary) hypertension: Secondary | ICD-10-CM | POA: Diagnosis present

## 2016-09-05 DIAGNOSIS — N183 Chronic kidney disease, stage 3 (moderate): Secondary | ICD-10-CM

## 2016-09-05 DIAGNOSIS — I2729 Other secondary pulmonary hypertension: Secondary | ICD-10-CM | POA: Diagnosis not present

## 2016-09-05 DIAGNOSIS — Z91018 Allergy to other foods: Secondary | ICD-10-CM

## 2016-09-05 DIAGNOSIS — W19XXXD Unspecified fall, subsequent encounter: Secondary | ICD-10-CM | POA: Diagnosis present

## 2016-09-05 DIAGNOSIS — Z8249 Family history of ischemic heart disease and other diseases of the circulatory system: Secondary | ICD-10-CM

## 2016-09-05 DIAGNOSIS — Z6841 Body Mass Index (BMI) 40.0 and over, adult: Secondary | ICD-10-CM

## 2016-09-05 DIAGNOSIS — S72402D Unspecified fracture of lower end of left femur, subsequent encounter for closed fracture with routine healing: Secondary | ICD-10-CM | POA: Diagnosis not present

## 2016-09-05 DIAGNOSIS — I480 Paroxysmal atrial fibrillation: Secondary | ICD-10-CM | POA: Diagnosis present

## 2016-09-05 DIAGNOSIS — S82892D Other fracture of left lower leg, subsequent encounter for closed fracture with routine healing: Secondary | ICD-10-CM

## 2016-09-05 DIAGNOSIS — I4581 Long QT syndrome: Secondary | ICD-10-CM | POA: Diagnosis present

## 2016-09-05 DIAGNOSIS — I509 Heart failure, unspecified: Secondary | ICD-10-CM | POA: Diagnosis not present

## 2016-09-05 DIAGNOSIS — Z955 Presence of coronary angioplasty implant and graft: Secondary | ICD-10-CM

## 2016-09-05 DIAGNOSIS — Z79899 Other long term (current) drug therapy: Secondary | ICD-10-CM

## 2016-09-05 HISTORY — DX: Other specified bacterial intestinal infections: A04.8

## 2016-09-05 HISTORY — DX: Cerebral infarction, unspecified: I63.9

## 2016-09-05 LAB — CBC WITH DIFFERENTIAL/PLATELET
Basophils Absolute: 0 10*3/uL (ref 0.0–0.1)
Basophils Relative: 0 %
Eosinophils Absolute: 0.1 10*3/uL (ref 0.0–0.7)
Eosinophils Relative: 1 %
HEMATOCRIT: 34.2 % — AB (ref 36.0–46.0)
HEMOGLOBIN: 9.8 g/dL — AB (ref 12.0–15.0)
LYMPHS ABS: 0.8 10*3/uL (ref 0.7–4.0)
Lymphocytes Relative: 9 %
MCH: 26 pg (ref 26.0–34.0)
MCHC: 28.7 g/dL — AB (ref 30.0–36.0)
MCV: 90.7 fL (ref 78.0–100.0)
MONOS PCT: 8 %
Monocytes Absolute: 0.7 10*3/uL (ref 0.1–1.0)
NEUTROS ABS: 7.6 10*3/uL (ref 1.7–7.7)
NEUTROS PCT: 82 %
Platelets: 220 10*3/uL (ref 150–400)
RBC: 3.77 MIL/uL — ABNORMAL LOW (ref 3.87–5.11)
RDW: 19.1 % — ABNORMAL HIGH (ref 11.5–15.5)
WBC: 9.3 10*3/uL (ref 4.0–10.5)

## 2016-09-05 LAB — BASIC METABOLIC PANEL
Anion gap: 11 (ref 5–15)
BUN: 61 mg/dL — AB (ref 6–20)
CO2: 37 mmol/L — AB (ref 22–32)
CREATININE: 1.65 mg/dL — AB (ref 0.44–1.00)
Calcium: 9.2 mg/dL (ref 8.9–10.3)
Chloride: 96 mmol/L — ABNORMAL LOW (ref 101–111)
GFR calc Af Amer: 39 mL/min — ABNORMAL LOW (ref >60)
GFR calc non Af Amer: 33 mL/min — ABNORMAL LOW (ref >60)
Glucose, Bld: 96 mg/dL (ref 65–99)
Potassium: 4.1 mmol/L (ref 3.5–5.1)
Sodium: 144 mmol/L (ref 135–145)

## 2016-09-05 LAB — BRAIN NATRIURETIC PEPTIDE: B Natriuretic Peptide: 665.3 pg/mL — ABNORMAL HIGH (ref 0.0–100.0)

## 2016-09-05 LAB — I-STAT TROPONIN, ED: TROPONIN I, POC: 0.01 ng/mL (ref 0.00–0.08)

## 2016-09-05 MED ORDER — BISACODYL 5 MG PO TBEC
5.0000 mg | DELAYED_RELEASE_TABLET | Freq: Every day | ORAL | Status: DC | PRN
  Administered 2016-09-07: 5 mg via ORAL
  Filled 2016-09-05: qty 1

## 2016-09-05 MED ORDER — FUROSEMIDE 40 MG PO TABS
80.0000 mg | ORAL_TABLET | Freq: Two times a day (BID) | ORAL | Status: DC
  Administered 2016-09-06: 80 mg via ORAL
  Filled 2016-09-05: qty 2

## 2016-09-05 MED ORDER — POLYETHYLENE GLYCOL 3350 17 G PO PACK: 17.0000 g | PACK | Freq: Every day | ORAL | Status: DC | PRN

## 2016-09-05 MED ORDER — ENOXAPARIN SODIUM 40 MG/0.4ML ~~LOC~~ SOLN
40.0000 mg | Freq: Every day | SUBCUTANEOUS | Status: DC
  Administered 2016-09-05: 40 mg via SUBCUTANEOUS
  Filled 2016-09-05: qty 0.4

## 2016-09-05 MED ORDER — ONDANSETRON HCL 4 MG PO TABS: 4.0000 mg | ORAL_TABLET | Freq: Four times a day (QID) | ORAL | Status: DC | PRN

## 2016-09-05 MED ORDER — ACETAMINOPHEN 650 MG RE SUPP: 650.0000 mg | Freq: Four times a day (QID) | RECTAL | Status: DC | PRN

## 2016-09-05 MED ORDER — ALBUTEROL SULFATE (2.5 MG/3ML) 0.083% IN NEBU
2.5000 mg | INHALATION_SOLUTION | RESPIRATORY_TRACT | Status: DC | PRN
  Administered 2016-09-10: 2.5 mg via RESPIRATORY_TRACT
  Filled 2016-09-05: qty 3

## 2016-09-05 MED ORDER — IPRATROPIUM-ALBUTEROL 0.5-2.5 (3) MG/3ML IN SOLN
3.0000 mL | Freq: Once | RESPIRATORY_TRACT | Status: AC
  Administered 2016-09-05: 3 mL via RESPIRATORY_TRACT
  Filled 2016-09-05: qty 3

## 2016-09-05 MED ORDER — DOCUSATE SODIUM 100 MG PO CAPS
100.0000 mg | ORAL_CAPSULE | Freq: Two times a day (BID) | ORAL | Status: DC
  Administered 2016-09-05 – 2016-09-14 (×18): 100 mg via ORAL
  Filled 2016-09-05 (×18): qty 1

## 2016-09-05 MED ORDER — FLEET ENEMA 7-19 GM/118ML RE ENEM: 1.0000 | ENEMA | Freq: Once | RECTAL | Status: DC | PRN

## 2016-09-05 MED ORDER — ADULT MULTIVITAMIN W/MINERALS CH
1.0000 | ORAL_TABLET | Freq: Every day | ORAL | Status: DC
  Administered 2016-09-05 – 2016-09-14 (×10): 1 via ORAL
  Filled 2016-09-05 (×10): qty 1

## 2016-09-05 MED ORDER — DICLOFENAC SODIUM 1 % TD GEL
4.0000 g | Freq: Four times a day (QID) | TRANSDERMAL | Status: DC | PRN
  Filled 2016-09-05: qty 100

## 2016-09-05 MED ORDER — OXYCODONE HCL 5 MG PO TABS
5.0000 mg | ORAL_TABLET | Freq: Four times a day (QID) | ORAL | Status: DC | PRN
  Administered 2016-09-05 – 2016-09-14 (×26): 5 mg via ORAL
  Filled 2016-09-05 (×26): qty 1

## 2016-09-05 MED ORDER — GABAPENTIN 300 MG PO CAPS
300.0000 mg | ORAL_CAPSULE | Freq: Every day | ORAL | Status: DC
  Administered 2016-09-05 – 2016-09-13 (×9): 300 mg via ORAL
  Filled 2016-09-05 (×9): qty 1

## 2016-09-05 MED ORDER — DILTIAZEM HCL ER 180 MG PO CP24
180.0000 mg | ORAL_CAPSULE | Freq: Every day | ORAL | Status: DC
  Administered 2016-09-05 – 2016-09-12 (×8): 180 mg via ORAL
  Filled 2016-09-05 (×14): qty 1

## 2016-09-05 MED ORDER — OMEGA-3-ACID ETHYL ESTERS 1 G PO CAPS
1.0000 g | ORAL_CAPSULE | Freq: Two times a day (BID) | ORAL | Status: DC
  Administered 2016-09-05 – 2016-09-14 (×18): 1 g via ORAL
  Filled 2016-09-05 (×18): qty 1

## 2016-09-05 MED ORDER — FUROSEMIDE 10 MG/ML IJ SOLN
40.0000 mg | Freq: Once | INTRAMUSCULAR | Status: AC
  Administered 2016-09-05: 40 mg via INTRAVENOUS
  Filled 2016-09-05: qty 4

## 2016-09-05 MED ORDER — PROPAFENONE HCL 150 MG PO TABS
150.0000 mg | ORAL_TABLET | Freq: Three times a day (TID) | ORAL | Status: DC
  Administered 2016-09-05 – 2016-09-14 (×26): 150 mg via ORAL
  Filled 2016-09-05 (×29): qty 1

## 2016-09-05 MED ORDER — CARVEDILOL 25 MG PO TABS
50.0000 mg | ORAL_TABLET | Freq: Two times a day (BID) | ORAL | Status: DC
  Administered 2016-09-06 – 2016-09-12 (×14): 50 mg via ORAL
  Filled 2016-09-05 (×14): qty 2

## 2016-09-05 MED ORDER — ACETAMINOPHEN 325 MG PO TABS
650.0000 mg | ORAL_TABLET | Freq: Four times a day (QID) | ORAL | Status: DC | PRN
  Administered 2016-09-06 – 2016-09-13 (×11): 650 mg via ORAL
  Filled 2016-09-05 (×11): qty 2

## 2016-09-05 MED ORDER — ALBUTEROL SULFATE (2.5 MG/3ML) 0.083% IN NEBU
5.0000 mg | INHALATION_SOLUTION | Freq: Once | RESPIRATORY_TRACT | Status: AC
  Administered 2016-09-05: 5 mg via RESPIRATORY_TRACT
  Filled 2016-09-05: qty 6

## 2016-09-05 MED ORDER — MORPHINE SULFATE (PF) 4 MG/ML IV SOLN
4.0000 mg | Freq: Once | INTRAVENOUS | Status: AC
  Administered 2016-09-05: 4 mg via INTRAVENOUS
  Filled 2016-09-05: qty 1

## 2016-09-05 MED ORDER — ONDANSETRON HCL 4 MG/2ML IJ SOLN: 4.0000 mg | Freq: Four times a day (QID) | INTRAMUSCULAR | Status: DC | PRN

## 2016-09-05 NOTE — ED Provider Notes (Signed)
[facility] DEPT Provider Note   CSN: [id number] Arrival date & time: 09/05/16  1420     History   Chief Complaint Chief Complaint  Patient presents with  . Shortness of Breath    HPI Nitasha L Cislo is a 60 y.o. female.  Patient presents emergency department with chief complaint of chest tightness and shortness of breath. She states that her symptoms have worsened since being discharged from the hospital 2 days ago. She was recently admitted for syncope and hypoxia. She reports increased shortness of breath and chest tightness with exertion. She has been using 3 L of home to as directed from her prior hospitalization. She denies any known fever, chills, or productive cough. She reports that she still has some swelling in her lower extremities, but that this has improved. She takes 80 mg Lasix twice a day. There are no other associated symptoms. There are no modifying factors.   The history is provided by the patient. No language interpreter was used.    Past Medical History:  Diagnosis Date  . Arthritis    "both knees" (01/14/2015)  . Asthma   . Chronic combined systolic and diastolic CHF (congestive heart failure) ([place])   . CKD (chronic kidney disease), stage III   . COPD (chronic obstructive pulmonary disease) ([place])   . Degenerative joint disease of both lower legs   . Edema    Lower extremity  . Former smoker   . Gout    hx in "both knees" (01/14/2015)  . High cholesterol   . Hypertension   . Morbid obesity ([place])   . NICM (nonischemic cardiomyopathy) ([place])    a. LHC 01/2015: normal cors, EF 45-50%. b. EF 50-55% in 08/2016.  [name] Nonischemic cardiomyopathy ([place])   . Normal coronary arteries   . OSA (obstructive sleep apnea)    "never sent CPAP out" (01/14/2015)  . Paroxysmal atrial fibrillation (HCC)   . Pneumonia X 2  . Pulmonary hypertension   . Rheumatoid arthritis ([place])   . Stroke [facility])     Patient Active Problem List   Diagnosis Date Noted  . Acute kidney  injury superimposed on CKD ([place]) 08/31/2016  . NICM (nonischemic cardiomyopathy) ([place]) 08/31/2016  . Pulmonary hypertension 08/31/2016  . Right heart failure, NYHA class 3 08/31/2016  . Acute on chronic combined systolic and diastolic congestive heart failure ([place]) 08/29/2016  . 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 Elevated d-dimer   . Syncope   . Pneumonia 04/04/2016  . Chronic systolic HF (heart failure) ([place]) 04/04/2016  . Hypomagnesemia 04/04/2016  . PNA (pneumonia) 04/04/2016  . AKI (acute kidney injury) ([place]) 03/08/2016  . Renal failure (ARF), acute on chronic (HCC) 03/08/2016  . ARF (acute renal failure) ([place]) 03/08/2016  . Chronic respiratory failure with hypoxia ([place]) 10/24/2015  . COPD mixed type ([place]) 10/10/2015  . Asthma exacerbation 10/10/2015  . Acute bronchitis 10/10/2015  . 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 Uncontrolled hypertension   . Chest pain 01/18/2015  . Acute combined systolic and diastolic congestive heart failure ([place]) 01/14/2015  . Acute on chronic respiratory failure with hypoxia ([place]) 01/14/2015  . Hypokalemia 01/14/2015  . Obesity hypoventilation syndrome ([place]) 01/14/2015  . H. pylori infection 01/03/2014  . Obstructive sleep apnea 09/28/2013  . Morbid obesity ([place]) 09/28/2013  . PAF (paroxysmal atrial fibrillation) ([place]) 01/09/2010  . Dyslipidemia 10/17/2006  . Essential hypertension 10/17/2006  .  VENTRICULAR HYPERTROPHY, LEFT 10/17/2006  . DYSFUNCTIONAL UTERINE BLEEDING 10/17/2006    Past Surgical History:  Procedure Laterality Date  . ANGIOGRAM/LV (CONGENITAL)  2007  . BREATH TEK H PYLORI N/A 12/31/2013   Procedure: [place];  Surgeon: Gayland Curry, MD;  Location: [facility];  Service: General;  Laterality: N/A;  . CORNEAL TRANSPLANT Right ~ 2010  . CORONARY ANGIOPLASTY WITH STENT PLACEMENT  11/04/2005   "1"  . DOPPLER  ECHOCARDIOGRAPHY     2 D   EF of 45%  . EYE SURGERY    . LEFT HEART CATHETERIZATION WITH CORONARY ANGIOGRAM N/A 01/20/2015   Procedure: LEFT HEART CATHETERIZATION WITH CORONARY ANGIOGRAM;  Surgeon: Lorretta Harp, MD;  Location: [facility] CATH LAB;  Service: Cardiovascular;  Laterality: N/A;  . sleep study  2011  . stress myocardial dipyridamole perfusion    . TUBAL LIGATION  1982  . venous duplex ultrasound  2013    OB History    No data available       Home Medications    Prior to Admission medications   Medication Sig Start Date End Date Taking? Authorizing Provider  albuterol (PROVENTIL HFA;VENTOLIN HFA) 108 (90 Base) MCG/ACT inhaler Inhale 2 puffs into the lungs every 6 (six) hours as needed for wheezing or shortness of breath. 10/24/15  Yes Deneise Lever, MD  carvedilol (COREG) 25 MG tablet Take 50 mg by mouth 2 (two) times daily with a meal.   Yes Historical Provider, MD  diclofenac sodium (VOLTAREN) 1 % GEL Apply 4 g topically 4 (four) times daily as needed (for pain).   Yes Historical Provider, MD  diltiazem (DILACOR XR) 180 MG 24 hr capsule Take 180 mg by mouth daily.   Yes Historical Provider, MD  furosemide (LASIX) 80 MG tablet Take 80 mg by mouth 2 (two) times daily.   Yes Historical Provider, MD  gabapentin (NEURONTIN) 300 MG capsule Take 1 capsule (300 mg total) by mouth at bedtime. 08/31/16  Yes Janece Canterbury, MD  Multiple Vitamin (MULTIVITAMIN WITH MINERALS) TABS tablet Take 1 tablet by mouth daily.   Yes Historical Provider, MD  omega-3 acid ethyl esters (LOVAZA) 1 g capsule Take 1 g by mouth 2 (two) times daily.   Yes Historical Provider, MD  oxyCODONE (OXY IR/ROXICODONE) 5 MG immediate release tablet Take 1 tablet (5 mg total) by mouth every 6 (six) hours as needed for breakthrough pain. 08/31/16  Yes Janece Canterbury, MD  propafenone (RYTHMOL) 150 MG tablet Take 150 mg by mouth every 8 (eight) hours.   Yes Historical Provider, MD    Family History Family History    Problem Relation Age of Onset  . Heart disease Mother   . Cancer Father     colon  . Hypertension Other     Social History Social History  Substance Use Topics  . Smoking status: Former Smoker    Packs/day: 0.33    Years: 35.00    Types: Cigarettes    Quit date: 10/11/2008  . Smokeless tobacco: Never Used  . Alcohol use 4.8 oz/week    8 Cans of beer per week     Comment: 01/14/2015 "~ 4, 24oz cans beer/wk"     Allergies   Penicillins and Citrus   Review of Systems Review of Systems  Respiratory: Positive for chest tightness and shortness of breath.   All other systems reviewed and are negative.    Physical Exam Updated Vital Signs BP 155/95 (BP Location: Right Arm)  Pulse 73   Temp 97.8 F (36.6 C) (Oral)   Resp 24   LMP 09/07/2011   SpO2 96%   Physical Exam  Constitutional: She is oriented to person, place, and time. She appears well-developed and well-nourished.  HENT:  Head: Normocephalic and atraumatic.  Eyes: Conjunctivae and EOM are normal. Pupils are equal, round, and reactive to light.  Neck: Normal range of motion. Neck supple.  Cardiovascular: Normal rate and regular rhythm.  Exam reveals no gallop and no friction rub.   No murmur heard. Pulmonary/Chest: Effort normal and breath sounds normal. No respiratory distress. She has no wheezes. She has no rales. She exhibits no tenderness.  Increased work of breathing Diminished lung sounds, no audible wheezes  Abdominal: Soft. Bowel sounds are normal. She exhibits no distension and no mass. There is no tenderness. There is no rebound and no guarding.  Musculoskeletal: Normal range of motion. She exhibits edema. She exhibits no tenderness.  Mild lower extremity edema  Neurological: She is alert and oriented to person, place, and time.  Skin: Skin is warm and dry.  Psychiatric: She has a normal mood and affect. Her behavior is normal. Judgment and thought content normal.  Nursing note and vitals  reviewed.    ED Treatments / Results  Labs (all labs ordered are listed, but only abnormal results are displayed) Labs Reviewed  CBC WITH DIFFERENTIAL/PLATELET  BASIC METABOLIC PANEL  BRAIN NATRIURETIC PEPTIDE  I-STAT [place], ED    EKG  EKG Interpretation None       Radiology Dg Chest 2 View  Result Date: 09/05/2016 CLINICAL DATA:  Shortness of Breath EXAM: CHEST  2 VIEW COMPARISON:  08/28/2016 FINDINGS: Cardiac shadow is again enlarged in size. Aortic calcifications are stable. The lungs are well aerated bilaterally. No focal infiltrate or sizable effusion is seen. No acute bony abnormality is noted. IMPRESSION: No acute abnormality noted. Electronically Signed   By: Inez Catalina M.D.   On: 09/05/2016 15:34    Procedures Procedures (including critical care time)  Medications Ordered in ED Medications  albuterol (PROVENTIL) (2.5 MG/3ML) 0.083% nebulizer solution 5 mg (not administered)  morphine 4 MG/ML injection 4 mg (not administered)     Initial Impression / Assessment and Plan / ED Course  I have reviewed the triage vital signs and the nursing notes.  Pertinent labs & imaging results that were available during my care of the patient were reviewed by me and considered in my medical decision making (see chart for details).  Clinical Course     Patient with SOB and chest tightness.  Worsening since being discharged from the hospital 2 days ago.  PE was considered during recent hospitalization, but ultimately thought to be less likely given chronic changes seen on echocardiogram.  Unfortunately the patient cannot get a CT PE study, and is too claustrophobic to get a V/Q.  Laboratory workup was fairly reassuring, chest x-ray shows no fluid overload, BNP is mildly elevated when compared to her discharge BNP. Patient's weight is up about 11 pounds since discharge. Patient seen by and discussed with Dr. Ralene Bathe, who states would really like to be admitted to skilled  nursing facility, which was advised to her during her recent hospitalization, however the patient declined at that time.  Appreciate Dr. Lorin Mercy from [facility] for observing the patient in the hospital and assisting with placement in a skilled nursing facility.  Final Clinical Impressions(s) / ED Diagnoses   Final diagnoses:  Congestive heart failure, unspecified congestive heart  failure chronicity, unspecified congestive heart failure type [facility])    New Prescriptions New Prescriptions   No medications on file     Montine Circle, PA-C 09/05/16 1924    Dorie Rank, MD 09/06/16 1153

## 2016-09-05 NOTE — H&P (Signed)
History and Physical    Paige Marshall [id number] DOB: 1955/12/26 DOA: 09/05/2016  PCP: Kerin Perna, NP Consultants:  Gwenlyn Found- cardiology; Young - pulmonology; Rheumatology Patient coming from: home - lives with son and boyfriend; NOK: son, [phone]  Chief Complaint: SOB  HPI: Paige Marshall is a 60 y.o. female with medical history significant of NICM with EF 40-45% in April 2016, CKD stage 3 at baseline, PAF, HTN, morbid obesity, mixed type COPD.  At baseline she uses O2 only at night, unable to tolerate CPAP.  She was admitted from 11/18-21 for syncope, right heart failure with NYHA class 3, PAF, morbid obesity, left fibular fracture, and acute on chronic respiratory failure.  She was recommended for SNF but she declined placement. Fine when she went home on 11/21.  She was discharged, went home, and got sick. She reports that she has been "going downhill" since she left.  Coughing, congested.  Drinking orange juice, 2% milk, water.  Walks with a walker because she needs knee replacement - but body is took weak for this thus far.  Has been working on losing weight so she can get her knees done - used to do water aerobics and walk daily and wants to get back to this.  Drinking protein shakes, but started having abdominal pain and so stopped drinking them.    Today, ate a spoonful of eggs and a piece of bacon and got to feeling bad.  Had been feeling bad the last couple of days but she didn't want to tell anyone.  She had a family meeting and decided it wasn't fair to son and boyfriend for them to have to help her do everything.  Now wants to go to rehab to stay until her leg is fixed.    +SOB.  +cough, productive of think yellow sputum, improved with nebulizer treatment.  Needs medication for leg pain, left her cream at home.  No fevers.  No N/V/D, +constipation.  Slight chest pain.   ED Course: Per PA-C Browning: Patient with SOB and chest tightness. Worsening since being  discharged from the hospital 2 days ago.  PE was considered during recent hospitalization, but ultimately thought to be less likely given chronic changes seen on echocardiogram. Unfortunately the patient cannot get a CT PE study, and is too claustrophobic to get a V/Q.  Laboratory workup was fairly reassuring, chest x-ray shows no fluid overload, BNP is mildly elevated when compared to her discharge BNP. Patient's weight is up about 11 pounds since discharge. Patient seen by and discussed with Dr. Ralene Bathe, who states would really like to be admitted to skilled nursing facility, which was advised to her during her recent hospitalization, however the patient declined at that time.  Appreciate Dr. Lorin Mercy from [facility] for observing the patient in the hospital and assisting with placement in a skilled nursing facility.   Review of Systems: As per HPI; otherwise 10 point review of systems reviewed and negative.   Ambulatory Status:   Unable to bear weight at this time, for 6 weeks  Past Medical History:  Diagnosis Date  . Arthritis    "both knees" (01/14/2015)  . Asthma   . Chronic combined systolic and diastolic CHF (congestive heart failure) ([place])   . CKD (chronic kidney disease), stage III   . COPD (chronic obstructive pulmonary disease) ([place])   . Degenerative joint disease of both lower legs   . Edema    Lower extremity  . Former smoker   .  Gout    hx in "both knees" (01/14/2015)  . High cholesterol   . Hypertension   . Morbid obesity ([place])   . NICM (nonischemic cardiomyopathy) ([place])    a. LHC 01/2015: normal cors, EF 45-50%. b. EF 50-55% in 08/2016.  [name] Nonischemic cardiomyopathy ([place])   . Normal coronary arteries   . OSA (obstructive sleep apnea)    "never sent CPAP out" (01/14/2015)  . Paroxysmal atrial fibrillation (HCC)   . Pneumonia X 2  . Pulmonary hypertension   . Rheumatoid arthritis ([place])   . Stroke [facility])     Past Surgical History:  Procedure Laterality Date  .  ANGIOGRAM/LV (CONGENITAL)  2007  . BREATH TEK H PYLORI N/A 12/31/2013   Procedure: [place];  Surgeon: Gayland Curry, MD;  Location: [facility];  Service: General;  Laterality: N/A;  . CORNEAL TRANSPLANT Right ~ 2010  . CORONARY ANGIOPLASTY WITH STENT PLACEMENT  11/04/2005   "1"  . DOPPLER ECHOCARDIOGRAPHY     2 D   EF of 45%  . EYE SURGERY    . LEFT HEART CATHETERIZATION WITH CORONARY ANGIOGRAM N/A 01/20/2015   Procedure: LEFT HEART CATHETERIZATION WITH CORONARY ANGIOGRAM;  Surgeon: Lorretta Harp, MD;  Location: [facility] CATH LAB;  Service: Cardiovascular;  Laterality: N/A;  . sleep study  2011  . stress myocardial dipyridamole perfusion    . TUBAL LIGATION  1982  . venous duplex ultrasound  2013    Social History   Social History  . Marital status: Single    Spouse name: N/A  . Number of children: N/A  . Years of education: N/A   Occupational History  . Not on file.   Social History Main Topics  . Smoking status: Former Smoker    Packs/day: 0.33    Years: 35.00    Types: Cigarettes    Quit date: 10/11/2008  . Smokeless tobacco: Never Used  . Alcohol use 4.8 oz/week    8 Cans of beer per week     Comment: 01/14/2015 "~ 4, 24oz cans beer/wk"  . Drug use: No  . Sexual activity: No   Other Topics Concern  . Not on file   Social History Narrative  . No narrative on file    Allergies  Allergen Reactions  . Penicillins Hives, Itching, Swelling and Other (See Comments)    Reaction:  Tongue swelling Has patient had a PCN reaction causing immediate rash, facial/tongue/throat swelling, SOB or lightheadedness with hypotension: Yes Has patient had a PCN reaction causing severe rash involving mucus membranes or skin necrosis: No Has patient had a PCN reaction that required hospitalization No Has patient had a PCN reaction occurring within the last 10 years: No If all of the above answers are "NO", then may proceed with Cephalosporin use.  . Citrus Hives    Family  History  Problem Relation Age of Onset  . Heart disease Mother   . Cancer Father     colon  . Hypertension Other     Prior to Admission medications   Medication Sig Start Date End Date Taking? Authorizing Provider  albuterol (PROVENTIL HFA;VENTOLIN HFA) 108 (90 Base) MCG/ACT inhaler Inhale 2 puffs into the lungs every 6 (six) hours as needed for wheezing or shortness of breath. 10/24/15  Yes Deneise Lever, MD  carvedilol (COREG) 25 MG tablet Take 50 mg by mouth 2 (two) times daily with a meal.   Yes Historical Provider, MD  diclofenac sodium (VOLTAREN) 1 %  GEL Apply 4 g topically 4 (four) times daily as needed (for pain).   Yes Historical Provider, MD  diltiazem (DILACOR XR) 180 MG 24 hr capsule Take 180 mg by mouth daily.   Yes Historical Provider, MD  furosemide (LASIX) 80 MG tablet Take 80 mg by mouth 2 (two) times daily.   Yes Historical Provider, MD  gabapentin (NEURONTIN) 300 MG capsule Take 1 capsule (300 mg total) by mouth at bedtime. 08/31/16  Yes Janece Canterbury, MD  Multiple Vitamin (MULTIVITAMIN WITH MINERALS) TABS tablet Take 1 tablet by mouth daily.   Yes Historical Provider, MD  omega-3 acid ethyl esters (LOVAZA) 1 g capsule Take 1 g by mouth 2 (two) times daily.   Yes Historical Provider, MD  oxyCODONE (OXY IR/ROXICODONE) 5 MG immediate release tablet Take 1 tablet (5 mg total) by mouth every 6 (six) hours as needed for breakthrough pain. 08/31/16  Yes Janece Canterbury, MD  propafenone (RYTHMOL) 150 MG tablet Take 150 mg by mouth every 8 (eight) hours.   Yes Historical Provider, MD    Physical Exam: Vitals:   09/05/16 1714 09/05/16 1823 09/05/16 1919 09/05/16 2043  BP: 137/91  134/74 (!) 141/86  Pulse: 73  76 74  Resp: 23  20 20   Temp:    98.1 F (36.7 C)  TempSrc:    Oral  SpO2: 98%  99%   Weight:  (!) 153.3 kg (338 lb)    Height:  5\' 7"  (1.702 m)       General:  Appears calm and comfortable and is NAD, super morbidly obese, very conversational Eyes:  PERRL,  EOMI, normal lids, iris ENT:  grossly normal hearing, lips & tongue, mmm Neck:  no LAD, masses or thyromegaly Cardiovascular:  RRR, no m/r/g. No LE edema.  Respiratory:  CTA bilaterally, no w/r/r. Normal respiratory effort. Abdomen:  soft, ntnd, NABS Skin:  no rash or induration seen on limited exam Musculoskeletal:  grossly normal tone BUE/RLE, CAM walker in place on left foot/ankle/lower leg. Psychiatric:  grossly normal mood and affect, speech fluent and appropriate, AOx3 Neurologic:  CN 2-12 grossly intact, moves all extremities in coordinated fashion, sensation intact  Labs on Admission: I have personally reviewed following labs and imaging studies  CBC:  Recent Labs Lab 08/30/16 0507 08/31/16 0341 09/01/16 0323 09/01/16 0921 09/05/16 1555  WBC 7.5 8.6 7.0 6.2 9.3  NEUTROABS  --   --   --   --  7.6  HGB 9.7* 9.3* 8.5* 8.8* 9.8*  HCT 32.3* 30.8* 28.1* 29.1* 34.2*  MCV 90.0 89.3 88.4 86.9 90.7  PLT 213 172 167 180 [id number]   Basic Metabolic Panel:  Recent Labs Lab 08/30/16 0507 08/31/16 0341 09/01/16 0323 09/05/16 1555  NA 140 138 138 144  K 3.3* 3.7 3.1* 4.1  CL 91* 90* 90* 96*  CO2 39* 39* 37* 37*  GLUCOSE 117* 126* 120* 96  BUN 74* 83* 93* 61*  CREATININE 2.34* 2.40* 2.55* 1.65*  CALCIUM 8.9 9.0 9.0 9.2   GFR: Estimated Creatinine Clearance: 57.7 mL/min (by C-G formula based on SCr of 1.65 mg/dL (H)). Liver Function Tests: No results for input(s): AST, ALT, ALKPHOS, BILITOT, PROT, ALBUMIN in the last 168 hours. No results for input(s): LIPASE, AMYLASE in the last 168 hours. No results for input(s): AMMONIA in the last 168 hours. Coagulation Profile:  Recent Labs Lab 08/30/16 0507  INR 1.08   Cardiac Enzymes: No results for input(s): CKTOTAL, CKMB, CKMBINDEX, TROPONINI in the last 168 hours. BNP (  last 3 results) No results for input(s): PROBNP in the last 8760 hours. HbA1C: No results for input(s): HGBA1C in the last 72 hours. CBG: No results for  input(s): GLUCAP in the last 168 hours. Lipid Profile: No results for input(s): CHOL, HDL, LDLCALC, TRIG, CHOLHDL, LDLDIRECT in the last 72 hours. Thyroid Function Tests: No results for input(s): TSH, T4TOTAL, FREET4, T3FREE, THYROIDAB in the last 72 hours. Anemia Panel: No results for input(s): VITAMINB12, FOLATE, FERRITIN, TIBC, IRON, RETICCTPCT in the last 72 hours. Urine analysis:    Component Value Date/Time   COLORURINE YELLOW 04/04/2016 1206   APPEARANCEUR HAZY (A) 04/04/2016 1206   LABSPEC 1.011 04/04/2016 1206   PHURINE 6.5 04/04/2016 1206   GLUCOSEU NEGATIVE 04/04/2016 1206   HGBUR NEGATIVE 04/04/2016 1206   BILIRUBINUR NEGATIVE 04/04/2016 1206   KETONESUR NEGATIVE 04/04/2016 1206   PROTEINUR NEGATIVE 04/04/2016 1206   UROBILINOGEN 1.0 11/16/2009 0554   NITRITE NEGATIVE 04/04/2016 1206   LEUKOCYTESUR NEGATIVE 04/04/2016 1206    Creatinine Clearance: Estimated Creatinine Clearance: 57.7 mL/min (by C-G formula based on SCr of 1.65 mg/dL (H)).  Sepsis Labs: @LABRCNTIP (procalcitonin:4,lacticidven:4) )No results found for this or any previous visit (from the past 240 hour(s)).   Radiological Exams on Admission: Dg Chest 2 View  Result Date: 09/05/2016 CLINICAL DATA:  Shortness of Breath EXAM: CHEST  2 VIEW COMPARISON:  08/28/2016 FINDINGS: Cardiac shadow is again enlarged in size. Aortic calcifications are stable. The lungs are well aerated bilaterally. No focal infiltrate or sizable effusion is seen. No acute bony abnormality is noted. IMPRESSION: No acute abnormality noted. Electronically Signed   By: Inez Catalina M.D.   On: 09/05/2016 15:34    EKG: Independently reviewed.  NSR with rate 75; prolonged QT (576), nonspecific ST changes with no evidence of acute ischemia  Assessment/Plan Principal Problem:   Acute on chronic respiratory failure with hypoxia (HCC) Active Problems:   Essential hypertension   PAF (paroxysmal atrial fibrillation) (HCC)   Morbid obesity  (HCC)   Chronic systolic HF (heart failure) (HCC)   CKD (chronic kidney disease) stage 4, GFR 15-29 ml/min (HCC)   Anemia   Left fibular fracture   Acute on chronic respiratory failure with hypoxia -Patient with a multitude of reasons for respiratory failure including CHF, OSA, OHS, and COPD. -While she is complaining of increased SOB, her BNP is only slightly higher than prior, her CXR is negative, and her O2 sats are not significantly different from baseline on her 3L home O2. -Suspect that this is more related to deconditioning and not an acute exacerbation of her chronic illnesses. -That said, she was given an extra dose of Lasix in the ER and her home dose is being continued. -She was also given a nebulizer treatment in the ER and this will be continued. -Otherwise, minimal intervention needed at this time.  CHF -11/20 echo with EF 50-55%, severe RV dilation (new).  -Her BNP is slightly up compared to prior but her lungs and CXR are clear -Given Lasix in ER -Will continue home Lasix at this time  Afib -CHA2Ds2VASc score is 3 ; she is not on anticoagulation due to h/o "excessive bleeding" per cardiology notes -Continue propafenone and diltiazem -Supposed to f/u with Dr. Gwenlyn Found in 4-6 weeks  Morbid obesity -Weighed 383 pounds in 2015 -341 pounds during last hospitalization, on 11/19 -Currently 338 -Needs ongoing weight loss -Very limited life expectancy if she is not able to lose a great deal of weight in the next few years.  CKD -Improved since last hospitalization. -Prior creatinine was in the 2.3-2.5 range, currently 1.65 which appears to be relative baseline. -Will follow.  Anemia -Also improved. -Appears to be at relative baseline. -Will follow.  Fibular fracture -Per Dr. Erlinda Hong from [facility], she is not a surgical candidate due to her chronic respiratory failure. -Patient is unable to weight bear and must be in CAM walker for 4-6 weeks. -She was  recommended for SNF placement during last hospitalization and declined. -Since arriving home, she has realized that she cannot be successful at home and needs placement. -Will place in obs and consult SW; she already has a qualifying inpatient stay.   DVT prophylaxis: Lovenox  Code Status: Full - confirmed with patient Family Communication: None present Disposition Plan:  Home once clinically improved Consults called: SW  Admission status: It is my clinical opinion that referral for OBSERVATION is reasonable and necessary in this patient based on the above information provided. The aforementioned taken together are felt to place the patient at high risk for further clinical deterioration. However it is anticipated that the patient may be medically stable for discharge from the hospital within 24 to 48 hours.     Karmen Bongo MD [facility]  If 7PM-7AM, please contact night-coverage [email or web address] Password [email or web address]  09/06/2016, 1:26 AM

## 2016-09-05 NOTE — ED Notes (Signed)
Bed: WA02 Expected date:  Expected time:  Means of arrival:  Comments: 60 yo weakness, generalized pain

## 2016-09-05 NOTE — ED Triage Notes (Signed)
Pt from home via EMS with complaints of SOB. Pt has hx of asthma and CHF. Pt is on 3 L of oxygen at her baseline. Pt was hospitalized for 1 week for a stroke (when she had her stroke she fell breaking her left ankle). Pt has had a productive cough since she was admitted to the hospital. Pt was dc'd 2 days ago

## 2016-09-05 NOTE — Progress Notes (Addendum)
Pt arrived to room 1520 at 2045pm. Pt is A&Ox4. Has right AC SL in place. Old bleeding noted. Left ankle CAM boot in place. Pt states she has left ankle fracture from a fall recently at home. Was hospitalized recently. Has O2 at 3L at home. Pt reports some SOB with exertion. Chest expansion is symmetrical. Pt oriented to room, call bell, etc. C/O pain, awaiting pharmacy approval of orders. Moved to a [employer]. Will continue to monitor.

## 2016-09-05 NOTE — ED Notes (Signed)
Patient transported to X-ray 

## 2016-09-06 DIAGNOSIS — I5043 Acute on chronic combined systolic (congestive) and diastolic (congestive) heart failure: Secondary | ICD-10-CM | POA: Diagnosis present

## 2016-09-06 DIAGNOSIS — N189 Chronic kidney disease, unspecified: Secondary | ICD-10-CM | POA: Diagnosis not present

## 2016-09-06 DIAGNOSIS — N179 Acute kidney failure, unspecified: Secondary | ICD-10-CM | POA: Diagnosis present

## 2016-09-06 DIAGNOSIS — M109 Gout, unspecified: Secondary | ICD-10-CM | POA: Diagnosis present

## 2016-09-06 DIAGNOSIS — I4581 Long QT syndrome: Secondary | ICD-10-CM | POA: Diagnosis present

## 2016-09-06 DIAGNOSIS — Z947 Corneal transplant status: Secondary | ICD-10-CM | POA: Diagnosis not present

## 2016-09-06 DIAGNOSIS — I509 Heart failure, unspecified: Secondary | ICD-10-CM | POA: Diagnosis present

## 2016-09-06 DIAGNOSIS — M6281 Muscle weakness (generalized): Secondary | ICD-10-CM | POA: Diagnosis not present

## 2016-09-06 DIAGNOSIS — E119 Type 2 diabetes mellitus without complications: Secondary | ICD-10-CM | POA: Diagnosis not present

## 2016-09-06 DIAGNOSIS — D631 Anemia in chronic kidney disease: Secondary | ICD-10-CM | POA: Diagnosis present

## 2016-09-06 DIAGNOSIS — M170 Bilateral primary osteoarthritis of knee: Secondary | ICD-10-CM | POA: Diagnosis present

## 2016-09-06 DIAGNOSIS — E662 Morbid (severe) obesity with alveolar hypoventilation: Secondary | ICD-10-CM | POA: Diagnosis present

## 2016-09-06 DIAGNOSIS — Z6841 Body Mass Index (BMI) 40.0 and over, adult: Secondary | ICD-10-CM | POA: Diagnosis not present

## 2016-09-06 DIAGNOSIS — N184 Chronic kidney disease, stage 4 (severe): Secondary | ICD-10-CM

## 2016-09-06 DIAGNOSIS — I480 Paroxysmal atrial fibrillation: Secondary | ICD-10-CM | POA: Diagnosis present

## 2016-09-06 DIAGNOSIS — D649 Anemia, unspecified: Secondary | ICD-10-CM | POA: Diagnosis present

## 2016-09-06 DIAGNOSIS — W19XXXD Unspecified fall, subsequent encounter: Secondary | ICD-10-CM | POA: Diagnosis present

## 2016-09-06 DIAGNOSIS — J9620 Acute and chronic respiratory failure, unspecified whether with hypoxia or hypercapnia: Secondary | ICD-10-CM | POA: Diagnosis present

## 2016-09-06 DIAGNOSIS — M84364D Stress fracture, left fibula, subsequent encounter for fracture with routine healing: Secondary | ICD-10-CM | POA: Diagnosis not present

## 2016-09-06 DIAGNOSIS — R0602 Shortness of breath: Secondary | ICD-10-CM

## 2016-09-06 DIAGNOSIS — G4733 Obstructive sleep apnea (adult) (pediatric): Secondary | ICD-10-CM | POA: Diagnosis present

## 2016-09-06 DIAGNOSIS — I2729 Other secondary pulmonary hypertension: Secondary | ICD-10-CM | POA: Diagnosis present

## 2016-09-06 DIAGNOSIS — I2510 Atherosclerotic heart disease of native coronary artery without angina pectoris: Secondary | ICD-10-CM | POA: Diagnosis present

## 2016-09-06 DIAGNOSIS — M069 Rheumatoid arthritis, unspecified: Secondary | ICD-10-CM | POA: Diagnosis present

## 2016-09-06 DIAGNOSIS — I10 Essential (primary) hypertension: Secondary | ICD-10-CM

## 2016-09-06 DIAGNOSIS — S82892D Other fracture of left lower leg, subsequent encounter for closed fracture with routine healing: Secondary | ICD-10-CM | POA: Diagnosis not present

## 2016-09-06 DIAGNOSIS — Z8673 Personal history of transient ischemic attack (TIA), and cerebral infarction without residual deficits: Secondary | ICD-10-CM | POA: Diagnosis not present

## 2016-09-06 DIAGNOSIS — R0902 Hypoxemia: Secondary | ICD-10-CM | POA: Diagnosis not present

## 2016-09-06 DIAGNOSIS — I130 Hypertensive heart and chronic kidney disease with heart failure and stage 1 through stage 4 chronic kidney disease, or unspecified chronic kidney disease: Secondary | ICD-10-CM | POA: Diagnosis present

## 2016-09-06 DIAGNOSIS — J449 Chronic obstructive pulmonary disease, unspecified: Secondary | ICD-10-CM | POA: Diagnosis present

## 2016-09-06 DIAGNOSIS — E7800 Pure hypercholesterolemia, unspecified: Secondary | ICD-10-CM | POA: Diagnosis present

## 2016-09-06 DIAGNOSIS — I5033 Acute on chronic diastolic (congestive) heart failure: Secondary | ICD-10-CM | POA: Diagnosis not present

## 2016-09-06 DIAGNOSIS — I429 Cardiomyopathy, unspecified: Secondary | ICD-10-CM | POA: Diagnosis present

## 2016-09-06 DIAGNOSIS — N183 Chronic kidney disease, stage 3 (moderate): Secondary | ICD-10-CM | POA: Diagnosis not present

## 2016-09-06 DIAGNOSIS — Z87891 Personal history of nicotine dependence: Secondary | ICD-10-CM | POA: Diagnosis not present

## 2016-09-06 DIAGNOSIS — S82402A Unspecified fracture of shaft of left fibula, initial encounter for closed fracture: Secondary | ICD-10-CM | POA: Diagnosis present

## 2016-09-06 DIAGNOSIS — J9600 Acute respiratory failure, unspecified whether with hypoxia or hypercapnia: Secondary | ICD-10-CM | POA: Diagnosis not present

## 2016-09-06 DIAGNOSIS — J9621 Acute and chronic respiratory failure with hypoxia: Secondary | ICD-10-CM | POA: Diagnosis present

## 2016-09-06 LAB — BASIC METABOLIC PANEL
ANION GAP: 9 (ref 5–15)
BUN: 55 mg/dL — ABNORMAL HIGH (ref 6–20)
CALCIUM: 8.9 mg/dL (ref 8.9–10.3)
CO2: 38 mmol/L — AB (ref 22–32)
CREATININE: 1.62 mg/dL — AB (ref 0.44–1.00)
Chloride: 93 mmol/L — ABNORMAL LOW (ref 101–111)
GFR calc non Af Amer: 34 mL/min — ABNORMAL LOW (ref >60)
GFR, EST AFRICAN AMERICAN: 39 mL/min — AB (ref >60)
Glucose, Bld: 133 mg/dL — ABNORMAL HIGH (ref 65–99)
Potassium: 3.6 mmol/L (ref 3.5–5.1)
SODIUM: 140 mmol/L (ref 135–145)

## 2016-09-06 LAB — CBC
HCT: 30.6 % — ABNORMAL LOW (ref 36.0–46.0)
HEMOGLOBIN: 8.9 g/dL — AB (ref 12.0–15.0)
MCH: 25.9 pg — ABNORMAL LOW (ref 26.0–34.0)
MCHC: 29.1 g/dL — ABNORMAL LOW (ref 30.0–36.0)
MCV: 89.2 fL (ref 78.0–100.0)
PLATELETS: 221 10*3/uL (ref 150–400)
RBC: 3.43 MIL/uL — AB (ref 3.87–5.11)
RDW: 18.9 % — ABNORMAL HIGH (ref 11.5–15.5)
WBC: 8.3 10*3/uL (ref 4.0–10.5)

## 2016-09-06 MED ORDER — FUROSEMIDE 10 MG/ML IJ SOLN
40.0000 mg | Freq: Once | INTRAMUSCULAR | Status: AC
  Administered 2016-09-06: 40 mg via INTRAVENOUS
  Filled 2016-09-06: qty 4

## 2016-09-06 MED ORDER — FUROSEMIDE 40 MG PO TABS
80.0000 mg | ORAL_TABLET | Freq: Two times a day (BID) | ORAL | Status: DC
  Administered 2016-09-07 – 2016-09-09 (×5): 80 mg via ORAL
  Filled 2016-09-06 (×5): qty 2

## 2016-09-06 MED ORDER — ENOXAPARIN SODIUM 80 MG/0.8ML ~~LOC~~ SOLN
80.0000 mg | Freq: Every day | SUBCUTANEOUS | Status: DC
  Administered 2016-09-06 – 2016-09-08 (×3): 80 mg via SUBCUTANEOUS
  Filled 2016-09-06 (×3): qty 0.8

## 2016-09-06 NOTE — Progress Notes (Signed)
PROGRESS NOTE    Paige Marshall  [id number] DOB: Jul 14, 1956 DOA: 09/05/2016 PCP: Kerin Perna, NP   Brief Narrative: 60 y.o. female with medical history significant of NICM with EF 40-45% in April 2016, CKD stage 3 at baseline, PAF, HTN, morbid obesity, mixed type COPD. At baseline she uses O2 only at night, unable to tolerate CPAP.  She was admitted from 11/18-21 for syncope, right heart failure with NYHA class 3, PAF, morbid obesity, left fibular fracture, and acute on chronic respiratory failure.  She was recommended for SNF but she declined placement. She went home on 11/21. At home, she didn't feel well, felt congested and weakness. She is now interested to go to rehab.   Assessment & Plan:   # Chronic respiratory failure with hypoxia ([place]): Patient's oxygen requirement is same as home oxygen, 3 L. BNP mildly elevated, chest x-ray clear. Does not look like congestive heart failure this time. She received a dose of IV Lasix in the ER. Currently on oral Lasix. -Patient with shortness of breath. Given recent hospitalization, likely fracture she has increased risk for PE. I'll order VQ scan. -Continue to provide bronchodilators and respiratory therapy.  #  Essential hypertension: Monitor blood pressure. Continue Coreg, diltiazem, Lasix   # PAF (paroxysmal atrial fibrillation) (HCC):she is not on anticoagulation due to h/o "excessive bleeding" per cardiology notes. -Continue propafenone and diltiazem -Supposed to f/u with Dr. Gwenlyn Found in 4-6 weeks  # Morbid obesity [facility]): Education provided.    # CKD (chronic kidney disease) stage 3: Serum creatinine level I.6. Monitor BMP. Avoid nephrotoxins.  # Left fibular fracture: as per H&P  -Per Dr. Erlinda Hong from [facility], she is not a surgical candidate due to her chronic respiratory failure.  -Patient is unable to weight bear and must be in CAM walker for 4-6 weeks.  -She was recommended for SNF placement during last  hospitalization and declined.  #Physical deconditioning due to above comorbidities: PT, OT and social worker referral for safe discharge planning. Discussed with the case manager.  DVT prophylaxis: Lovenox subcutaneous Code Status: Full code Family Communication: No family present at bedside Disposition Plan: Likely discharge to rehabilitation versus SNF. 1-2 days    Consultants:   None  Procedures: None Antimicrobials: None  Subjective: Patient was seen and examined at bedside. Patient reported feeling weak tired. Reported shortness of breath. No fever, chills, chest pain, nausea, vomiting. Has a stable left foot pain.   Objective: Vitals:   09/05/16 1919 09/05/16 2043 09/06/16 0430 09/06/16 0500  BP: 134/74 (!) 141/86 (!) 142/81   Pulse: 76 74 73   Resp: 20 20 19    Temp:  98.1 F (36.7 C) 98.2 F (36.8 C)   TempSrc:  Oral Oral   SpO2: 99%  92%   Weight:    (!) 157 kg (346 lb 2 oz)  Height:        Intake/Output Summary (Last 24 hours) at 09/06/16 1340 Last data filed at 09/05/16 2140  Gross per 24 hour  Intake              240 ml  Output              300 ml  Net              -60 ml   Filed Weights   09/05/16 1823 09/06/16 0500  Weight: (!) 153.3 kg (338 lb) (!) 157 kg (346 lb 2 oz)    Examination:  General exam: Morbidly  obese female, appears calm and comfortable  Respiratory system: Intermittent diffuse expiratory wheeze, Respiratory effort normal.  Cardiovascular system: S1 & S2 heard, RRR.  No pedal edema. Gastrointestinal system: Abdomen is nondistended, soft and nontender. Normal bowel sounds heard. Central nervous system: Alert and oriented. No focal neurological deficits. Extremities: Left foot has a splint on. Skin: No rashes, lesions or ulcers Psychiatry: Judgement and insight appear normal. Mood & affect appropriate.     Data Reviewed: I have personally reviewed following labs and imaging studies  CBC:  Recent Labs Lab 08/31/16 0341  09/01/16 0323 09/01/16 0921 09/05/16 1555 09/06/16 0514  WBC 8.6 7.0 6.2 9.3 8.3  NEUTROABS  --   --   --  7.6  --   HGB 9.3* 8.5* 8.8* 9.8* 8.9*  HCT 30.8* 28.1* 29.1* 34.2* 30.6*  MCV 89.3 88.4 86.9 90.7 89.2  PLT 172 167 180 220 [id number]   Basic Metabolic Panel:  Recent Labs Lab 08/31/16 0341 09/01/16 0323 09/05/16 1555 09/06/16 0514  NA 138 138 144 140  K 3.7 3.1* 4.1 3.6  CL 90* 90* 96* 93*  CO2 39* 37* 37* 38*  GLUCOSE 126* 120* 96 133*  BUN 83* 93* 61* 55*  CREATININE 2.40* 2.55* 1.65* 1.62*  CALCIUM 9.0 9.0 9.2 8.9   GFR: Estimated Creatinine Clearance: 59.6 mL/min (by C-G formula based on SCr of 1.62 mg/dL (H)). Liver Function Tests: No results for input(s): AST, ALT, ALKPHOS, BILITOT, PROT, ALBUMIN in the last 168 hours. No results for input(s): LIPASE, AMYLASE in the last 168 hours. No results for input(s): AMMONIA in the last 168 hours. Coagulation Profile: No results for input(s): INR, PROTIME in the last 168 hours. Cardiac Enzymes: No results for input(s): CKTOTAL, CKMB, CKMBINDEX, TROPONINI in the last 168 hours. BNP (last 3 results) No results for input(s): PROBNP in the last 8760 hours. HbA1C: No results for input(s): HGBA1C in the last 72 hours. CBG: No results for input(s): GLUCAP in the last 168 hours. Lipid Profile: No results for input(s): CHOL, HDL, LDLCALC, TRIG, CHOLHDL, LDLDIRECT in the last 72 hours. Thyroid Function Tests: No results for input(s): TSH, T4TOTAL, FREET4, T3FREE, THYROIDAB in the last 72 hours. Anemia Panel: No results for input(s): VITAMINB12, FOLATE, FERRITIN, TIBC, IRON, RETICCTPCT in the last 72 hours. Sepsis Labs: No results for input(s): PROCALCITON, LATICACIDVEN in the last 168 hours.  No results found for this or any previous visit (from the past 240 hour(s)).       Radiology Studies: Dg Chest 2 View  Result Date: 09/05/2016 CLINICAL DATA:  Shortness of Breath EXAM: CHEST  2 VIEW COMPARISON:  08/28/2016  FINDINGS: Cardiac shadow is again enlarged in size. Aortic calcifications are stable. The lungs are well aerated bilaterally. No focal infiltrate or sizable effusion is seen. No acute bony abnormality is noted. IMPRESSION: No acute abnormality noted. Electronically Signed   By: Inez Catalina M.D.   On: 09/05/2016 15:34        Scheduled Meds: . carvedilol  50 mg Oral BID WC  . diltiazem  180 mg Oral Daily  . docusate sodium  100 mg Oral BID  . enoxaparin (LOVENOX) injection  40 mg Subcutaneous QHS  . furosemide  80 mg Oral BID  . gabapentin  300 mg Oral QHS  . multivitamin with minerals  1 tablet Oral Daily  . omega-3 acid ethyl esters  1 g Oral BID  . propafenone  150 mg Oral Q8H   Continuous Infusions:   LOS: 0 days  Ravindra Baranek Tanna Furry, MD [facility] Pager [phone]  If 7PM-7AM, please contact night-coverage [email or web address] Password [email or web address] 09/06/2016, 1:40 PM

## 2016-09-06 NOTE — Progress Notes (Signed)
OT Cancellation Note  Patient Details Name: Paige Marshall MRN: [id number] DOB: 11-01-1955   Cancelled Treatment:    Reason Eval/Treat Not Completed: Medical issues which prohibited therapy  Noted VQ scan ordered.  Will await results  Kari Baars, [place] Payton Mccallum D 09/06/2016, 3:18 PM

## 2016-09-06 NOTE — Progress Notes (Signed)
PT Cancellation Note  Patient Details Name: Paige Marshall MRN: [id number] DOB: [birth year]   Cancelled Treatment:       Medical issues which prohibited therapy  Noted VQ scan ordered.   Clide Dales 09/06/2016, 4:41 PM

## 2016-09-07 ENCOUNTER — Encounter (HOSPITAL_COMMUNITY): Payer: Self-pay

## 2016-09-07 NOTE — Evaluation (Signed)
Occupational Therapy Evaluation and Discharge Patient Details Name: Paige Marshall MRN: [id number] DOB: 1955-11-02 Today's Date: 09/07/2016    History of Present Illness Paige Marshall is a 60 y.o. 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 Pt DCed home and is now readmitted with acute on chronic respiratory failure.    Clinical Impression   Prior to session I paged Paige Marshall concerning OT/PT working with pt since VQ scan had not been completed. He called me back and said we could go ahead and see her. This 60 yo female admitted with above presents to acute OT with deficits below (see OT problem list) thus affecting her PLOF of Independent with basic and IADLs. She will benefit from continued OT at SNF to work back towards [place]. Remainder of OT deferred to SNF, we will sign off.    Follow Up Recommendations  SNF;Supervision/Assistance - 24 hour    Equipment Recommendations  Other (comment) (TBD at SNF)       Precautions / Restrictions Precautions Precautions: Fall Required Braces or Orthoses: Other Brace/Splint Other Brace/Splint: CAM boot Restrictions Weight Bearing Restrictions: Yes LLE Weight Bearing: Non weight bearing      Mobility Bed Mobility Overal bed mobility: Needs Assistance Bed Mobility: Supine to Sit     Supine to sit: Supervision     General bed mobility comments: HOB up and use of rails  Transfers                 General transfer comment: pt unable to stand with RW, +2 A , and bed raised. Also not able to maintain [place] on LLE when attempting to stand. Maximove to transfer to 3 in 1  then to recliner.     Balance Overall balance assessment: History of Falls Sitting-balance support: No upper extremity supported;Feet supported Sitting balance-Leahy Scale: Good                                      ADL Overall ADL's : Needs assistance/impaired Eating/Feeding: Independent;Sitting   Grooming: Set up;Sitting   Upper Body Bathing: Set up;Sitting   Lower Body Bathing: Total assistance;Bed level   Upper Body Dressing : Set up;Sitting   Lower Body Dressing: Total assistance;Bed level   Toilet Transfer: Total assistance;+2 for physical assistance Toilet Transfer Details (indicate cue type and reason): use of maxi move Toileting- Clothing Manipulation and Hygiene: Total assistance Toileting - Clothing Manipulation Details (indicate cue type and reason): in maxi sky sling                       Pertinent Vitals/Pain Pain Assessment: No/denies pain     Hand Dominance Right   Extremity/Trunk Assessment Upper Extremity Assessment Upper Extremity Assessment: Overall WFL for tasks assessed   Lower Extremity Assessment RLE Deficits / Details: grossly 4/5, patient states that the left leg was the good leg and the right knee has arthritis and needs a TKA. LLE Deficits / Details: knee ext 3/5, ankle in CAM walker   Cervical / Trunk Assessment Cervical / Trunk Assessment: Normal   Communication Communication Communication: No difficulties   Cognition Arousal/Alertness: Awake/alert  Behavior During Therapy: WFL for tasks assessed/performed Overall Cognitive Status: Within Functional Limits for tasks assessed                                Home Living Family/patient expects to be discharged to:: Skilled nursing facility Living Arrangements: Children Available Help at Discharge: Family;Available 24 hours/day Type of Home: House Home Access: Stairs to enter [employer] of Steps: 4 Entrance Stairs-Rails: Right;Left Home  Layout: One level               Home Equipment: Walker - 2 wheels;Cane - single point;Electric scooter;Wheelchair - manual          Prior Functioning/Environment Level of Independence: Needs assistance  Gait / Transfers Assistance Needed: since LLE fx has needed assistance to transfer to [facility]; prior to fx ambulated with RW              OT Problem List: Decreased strength;Decreased range of motion;Impaired balance (sitting and/or standing);Obesity;Decreased knowledge of use of DME or AE   OT Treatment/Interventions:      OT Goals(Current goals can be found in the care plan section) Acute Rehab OT Goals Patient Stated Goal: return to water aerobics  OT Frequency:             Co-evaluation PT/OT/SLP Co-Evaluation/Treatment: Yes (partial) Reason for Co-Treatment: Complexity of the patient's impairments (multi-system involvement);For patient/therapist safety PT goals addressed during session: Mobility/safety with mobility;Strengthening/ROM OT goals addressed during session: ADL's and self-care;Strengthening/ROM      End of Session Equipment Utilized During Treatment: Gait belt;Rolling walker;Other (comment) (maxi move) Nurse Communication: Other (comment) (pt did have a bowel movement)  Activity Tolerance: Patient tolerated treatment well Patient left: in chair;with call bell/phone within reach;with chair alarm set (maxi move sling in place)   Time: 1245-1350 OT Time Calculation (min): 65 min Charges:  OT Evaluation $OT Eval Moderate Complexity: 1 Procedure OT Treatments $Self Care/Home Management : 23-37 mins  Paige Marshall [id number] 09/07/2016, 2:48 PM

## 2016-09-07 NOTE — Progress Notes (Signed)
  Progress Note   Date: 09/07/2016  Patient Name: Paige Marshall        MRN#: [id number]  Clarification of the diagnosis of anemia: Normocytic anemia.    Wood Novacek Tanna Furry, MD

## 2016-09-07 NOTE — Evaluation (Signed)
Physical Therapy Evaluation Patient Details Name: Paige Marshall MRN: [id number] DOB: 07/18/1956 Today's Date: 09/07/2016   History of Present Illness  Paige Marshall is a 60 y.o. 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 Pt DCed home and is now readmitted with acute on chronic respiratory failure.   Clinical Impression  Pt admitted with above diagnosis. Pt currently with functional limitations due to the deficits listed below (see PT Problem List). Mechanical lift required for transfers, pt was unable to stand with RW. Pt agreeable to ST-SNF. Pt will benefit from skilled PT to increase their independence and safety with mobility to allow discharge to the venue listed below.       Follow Up Recommendations SNF;Supervision/Assistance - 24 hour    Equipment Recommendations  None recommended by PT    Recommendations for Other Services       Precautions / Restrictions Precautions Required Braces or Orthoses: Other Brace/Splint Other Brace/Splint: CAM boot Restrictions Weight Bearing Restrictions: Yes LLE Weight Bearing: Non weight bearing      Mobility  Bed Mobility               General bed mobility comments: see OT note  Transfers                 General transfer comment: pt unable to stand with RW, Maximove to transfer to 3 in 1 then to recliner.   Ambulation/Gait             General Gait Details: unable due to NWB on LLE , obesity and right knee is not  strong per patient.  Stairs            Wheelchair Mobility    Modified Rankin (Stroke Patients Only)       Balance     Sitting  balance-Leahy Scale: Good                                       Pertinent Vitals/Pain Pain Assessment: No/denies pain    Home Living Family/patient expects to be discharged to:: Skilled nursing facility Living Arrangements: Children Available Help at Discharge: Family;Available 24 hours/day Type of Home: House Home Access: Stairs to enter Entrance Stairs-Rails: Psychiatric nurse of Steps: 4 Home Layout: One level Home Equipment: Walker - 2 wheels;Cane - single point;Electric scooter;Wheelchair - manual      Prior Function Level of Independence: Needs assistance   Gait / Transfers Assistance Needed: since LLE fx has needed assistance to transfer to [facility]; prior to fx ambulated with RW           Hand Dominance        Extremity/Trunk Assessment   Upper Extremity Assessment: Defer to OT evaluation             RLE Deficits / Details: grossly 4/5, patient states that the left leg was the good leg and the right knee has arthritis and needs a TKA. LLE Deficits / Details: knee ext 3/5, ankle in CAM walker  Cervical / Trunk Assessment: Normal  Communication   Communication: No difficulties  Cognition Arousal/Alertness: Awake/alert Behavior During Therapy: WFL for tasks assessed/performed  Overall Cognitive Status: Within Functional Limits for tasks assessed                      General Comments General comments (skin integrity, edema, etc.): SaO2 93% on 3L O2 [place]    Exercises General Exercises - Lower Extremity Quad Sets: AROM;Left;10 reps;Seated Gluteal Sets: AROM;10 reps;Both;Seated   Assessment/Plan    PT Assessment Patient needs continued PT services  PT Problem List Decreased strength;Decreased activity tolerance;Decreased mobility;Cardiopulmonary status limiting activity;Decreased knowledge of precautions;Decreased safety awareness;Decreased knowledge of use of DME;Pain;Obesity          PT Treatment Interventions  Functional mobility training;Therapeutic exercise;Therapeutic activities;Patient/family education    PT Goals (Current goals can be found in the Care Plan section)  Acute Rehab PT Goals Patient Stated Goal: return to water aerobics PT Goal Formulation: With patient Time For Goal Achievement: 09/21/16 Potential to Achieve Goals: Good    Frequency     Barriers to discharge Decreased caregiver support pt wants to go to SNF    Co-evaluation PT/OT/SLP Co-Evaluation/Treatment: Yes Reason for Co-Treatment: Complexity of the patient's impairments (multi-system involvement);For patient/therapist safety PT goals addressed during session: Mobility/safety with mobility;Strengthening/ROM         End of Session Equipment Utilized During Treatment: Oxygen Activity Tolerance: Patient tolerated treatment well Patient left: in chair;with call bell/phone within reach;with family/visitor present;with chair alarm set Nurse Communication: Mobility status;Need for lift equipment         Time: [id number] PT Time Calculation (min) (ACUTE ONLY): 30 min   Charges:   PT Evaluation $PT Eval Moderate Complexity: 1 Procedure     PT G Codes:        Paige Marshall 09/07/2016, 2:03 PM [phone]

## 2016-09-07 NOTE — Progress Notes (Addendum)
PROGRESS NOTE    Paige Marshall  [id number] DOB: 1955-11-01 DOA: 09/05/2016 PCP: Kerin Perna, NP   Brief Narrative: 60 y.o. female with medical history significant of NICM with EF 40-45% in April 2016, CKD stage 3 at baseline, PAF, HTN, morbid obesity, mixed type COPD. At baseline she uses O2 only at night, unable to tolerate CPAP.  She was admitted from 11/18-21 for syncope, right heart failure with NYHA class 3, PAF, morbid obesity, left fibular fracture, and acute on chronic respiratory failure.  She was recommended for SNF but she declined placement. She went home on 11/21. At home, she didn't feel well, felt congested and weakness. She is now interested to go to rehab.   Assessment & Plan:   # Chronic respiratory failure with hypoxia ([place]): Patient's oxygen requirement is same as home oxygen, 3 L. BNP mildly elevated, chest x-ray clear. ? acute diastolic CHF. She received two doses of IV Lasix.  -Currently on oral Lasix. -Patient with shortness of breath. Given recent hospitalization and fracture of left leg, she has increased risk for Pe. Pending VQ scan. -Continue to provide bronchodilators and respiratory therapy.  #  Essential hypertension: Monitor blood pressure. Continue Coreg, diltiazem, Lasix   # PAF (paroxysmal atrial fibrillation) (HCC):she is not on anticoagulation due to h/o "excessive bleeding" per cardiology notes. -Continue propafenone and diltiazem  # Morbid obesity [facility]): Education provided.    # CKD (chronic kidney disease) stage 3: Serum creatinine level I.6. Monitor BMP. Avoid nephrotoxins.  # Left fibular fracture: as per H&P  -Per Dr. Erlinda Hong from [facility], she is not a surgical candidate due to her chronic respiratory failure.  -Patient is unable to weight bear and must be in CAM walker for 4-6 weeks.  -She was recommended for SNF placement during last hospitalization and declined.  #Physical deconditioning due to above  comorbidities: PT, OT and social worker referral for safe discharge planning. I discussed with the social worker regarding the discharge plan. Patient reported that she cannot go home as she has difficulty moving out of bed and requires full assistance.  # normocytic anemia: hb stable. Monitor.   DVT prophylaxis: Lovenox subcutaneous Code Status: Full code Family Communication: No family present at bedside Disposition Plan: Likely discharge to rehabilitation versus SNF. 1-2 days    Consultants:   None  Procedures: None Antimicrobials: None  Subjective: Patient was seen and examined at bedside. No new event. Continue to feel tired. Stable leg pain. Denied headache, nausea, vomiting, chest pain or shortness of breath.   Objective: Vitals:   09/06/16 0500 09/06/16 1441 09/06/16 2103 09/07/16 0503  BP:  (!) 95/58 106/71 135/81  Pulse:  60 67 66  Resp:  19 19 19   Temp:  98.3 F (36.8 C) 98.2 F (36.8 C) 97.9 F (36.6 C)  TempSrc:  Oral Oral Oral  SpO2:   92% 95%  Weight: (!) 157 kg (346 lb 2 oz)     Height:        Intake/Output Summary (Last 24 hours) at 09/07/16 1256 Last data filed at 09/06/16 2200  Gross per 24 hour  Intake              240 ml  Output                0 ml  Net              240 ml   Filed Weights   09/05/16 1823 09/06/16 0500  Weight: [name])  153.3 kg (338 lb) (!) 157 kg (346 lb 2 oz)    Examination:  General exam: Morbidly obese female, appears calm and comfortable  Respiratory system: Clear bilaterally, respiratory effort normal  Cardiovascular system: S1 & S2 heard, RRR.  No pedal edema. Gastrointestinal system: Abdomen is nondistended, soft and nontender. Normal bowel sounds heard. Central nervous system: Alert and oriented. No focal neurological deficits. Extremities: Left foot has a splint on. Able to move toes, no cyanosis Skin: No rashes, lesions or ulcers Psychiatry: Judgement and insight appear normal. Mood & affect appropriate.      Data Reviewed: I have personally reviewed following labs and imaging studies  CBC:  Recent Labs Lab 09/01/16 0323 09/01/16 0921 09/05/16 1555 09/06/16 0514  WBC 7.0 6.2 9.3 8.3  NEUTROABS  --   --  7.6  --   HGB 8.5* 8.8* 9.8* 8.9*  HCT 28.1* 29.1* 34.2* 30.6*  MCV 88.4 86.9 90.7 89.2  PLT 167 180 220 [id number]   Basic Metabolic Panel:  Recent Labs Lab 09/01/16 0323 09/05/16 1555 09/06/16 0514  NA 138 144 140  K 3.1* 4.1 3.6  CL 90* 96* 93*  CO2 37* 37* 38*  GLUCOSE 120* 96 133*  BUN 93* 61* 55*  CREATININE 2.55* 1.65* 1.62*  CALCIUM 9.0 9.2 8.9   GFR: Estimated Creatinine Clearance: 59.6 mL/min (by C-G formula based on SCr of 1.62 mg/dL (H)). Liver Function Tests: No results for input(s): AST, ALT, ALKPHOS, BILITOT, PROT, ALBUMIN in the last 168 hours. No results for input(s): LIPASE, AMYLASE in the last 168 hours. No results for input(s): AMMONIA in the last 168 hours. Coagulation Profile: No results for input(s): INR, PROTIME in the last 168 hours. Cardiac Enzymes: No results for input(s): CKTOTAL, CKMB, CKMBINDEX, TROPONINI in the last 168 hours. BNP (last 3 results) No results for input(s): PROBNP in the last 8760 hours. HbA1C: No results for input(s): HGBA1C in the last 72 hours. CBG: No results for input(s): GLUCAP in the last 168 hours. Lipid Profile: No results for input(s): CHOL, HDL, LDLCALC, TRIG, CHOLHDL, LDLDIRECT in the last 72 hours. Thyroid Function Tests: No results for input(s): TSH, T4TOTAL, FREET4, T3FREE, THYROIDAB in the last 72 hours. Anemia Panel: No results for input(s): VITAMINB12, FOLATE, FERRITIN, TIBC, IRON, RETICCTPCT in the last 72 hours. Sepsis Labs: No results for input(s): PROCALCITON, LATICACIDVEN in the last 168 hours.  No results found for this or any previous visit (from the past 240 hour(s)).       Radiology Studies: Dg Chest 2 View  Result Date: 09/05/2016 CLINICAL DATA:  Shortness of Breath EXAM: CHEST  2  VIEW COMPARISON:  08/28/2016 FINDINGS: Cardiac shadow is again enlarged in size. Aortic calcifications are stable. The lungs are well aerated bilaterally. No focal infiltrate or sizable effusion is seen. No acute bony abnormality is noted. IMPRESSION: No acute abnormality noted. Electronically Signed   By: Inez Catalina M.D.   On: 09/05/2016 15:34        Scheduled Meds: . carvedilol  50 mg Oral BID WC  . diltiazem  180 mg Oral Daily  . docusate sodium  100 mg Oral BID  . enoxaparin (LOVENOX) injection  80 mg Subcutaneous QHS  . furosemide  80 mg Oral BID  . gabapentin  300 mg Oral QHS  . multivitamin with minerals  1 tablet Oral Daily  . omega-3 acid ethyl esters  1 g Oral BID  . propafenone  150 mg Oral Q8H   Continuous Infusions:  LOS: 1 day    Paige Cavalieri Tanna Furry, MD [facility] Pager [phone]  If 7PM-7AM, please contact night-coverage [email or web address] Password [facility] 09/07/2016, 12:56 PM

## 2016-09-07 NOTE — NC FL2 (Addendum)
[place] LEVEL OF CARE SCREENING TOOL     IDENTIFICATION  Patient Name: Paige Marshall Birthdate: [birth year] Sex: female Admission Date (Current Location): 09/05/2016  [facility] and [place] Number:  Herbalist and Address:  [facility],  [place] [address] [place]      Provider Number: [id number]  Attending Physician Name and Address:  Rosita Fire, MD  Relative Name and Phone Number:       Current Level of Care: Hospital Recommended Level of Care: [place] Prior Approval Number:    Date Approved/Denied:   PASRR Number:   [id number]  Discharge Plan: SNF    Current Diagnoses: Patient Active Problem List   Diagnosis Date Noted  . Anemia 09/06/2016  . Left fibular fracture 09/06/2016  . Congestive heart failure ([place]) 09/06/2016  . SOB (shortness of breath)   . Acute kidney injury superimposed on CKD ([place]) 08/31/2016  . NICM (nonischemic cardiomyopathy) ([place]) 08/31/2016  . Pulmonary hypertension 08/31/2016  . Right heart failure, NYHA class 3 08/31/2016  . Acute on chronic combined systolic and diastolic congestive heart failure ([place]) 08/29/2016  . 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 Elevated d-dimer   . Syncope   . Pneumonia 04/04/2016  . Chronic systolic HF (heart failure) ([place]) 04/04/2016  . Hypomagnesemia 04/04/2016  . Chronic respiratory failure with hypoxia ([place]) 10/24/2015  . COPD mixed type ([place]) 10/10/2015  . Asthma exacerbation 10/10/2015  . Acute bronchitis 10/10/2015  . 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 Uncontrolled hypertension   . Chest pain 01/18/2015  . Acute combined systolic and diastolic congestive heart failure ([place]) 01/14/2015  . Acute on chronic respiratory failure with hypoxia ([place]) 01/14/2015  . Hypokalemia 01/14/2015  . Obesity hypoventilation syndrome ([place])  01/14/2015  . H. pylori infection 01/03/2014  . Obstructive sleep apnea 09/28/2013  . Morbid obesity ([place]) 09/28/2013  . PAF (paroxysmal atrial fibrillation) ([place]) 01/09/2010  . Dyslipidemia 10/17/2006  . Essential hypertension 10/17/2006  . VENTRICULAR HYPERTROPHY, LEFT 10/17/2006  . DYSFUNCTIONAL UTERINE BLEEDING 10/17/2006    Orientation RESPIRATION BLADDER Height & Weight     Self, Time, Situation, Place  Normal Continent Weight: (!) 346 lb 2 oz (157 kg) ([place] Bed) Height:  5\' 7"  (170.2 cm)  BEHAVIORAL SYMPTOMS/MOOD NEUROLOGICAL BOWEL NUTRITION STATUS      Continent Diet (Regular)  AMBULATORY STATUS COMMUNICATION OF NEEDS Skin   Extensive Assist Verbally Normal                       Personal Care Assistance Level of Assistance  Bathing, Feeding, Dressing Bathing Assistance: Limited assistance Feeding assistance: Independent Dressing Assistance: Limited assistance     Functional Limitations Info  Sight, Hearing, Speech Sight Info: Impaired Hearing Info: Adequate Speech Info: Adequate    SPECIAL CARE FACTORS FREQUENCY  OT (By licensed OT)     PT Frequency: 5 OT Frequency: 5            Contractures Contractures Info: Not present    Additional Factors Info  Code Status, Allergies Code Status Info: Full Code Allergies Info: Penicillins, Citrus           Current Medications (09/07/2016):  This is the current hospital active medication list Current Facility-Administered Medications  Medication Dose Route Frequency Provider Last Rate Last Dose  . acetaminophen (TYLENOL) tablet 650 mg  650 mg Oral Q6H PRN Karmen Bongo, MD   650 mg at 09/06/16 2152   Or  . acetaminophen (TYLENOL) suppository 650 mg  650 mg Rectal Q6H PRN Karmen Bongo, MD      . albuterol (PROVENTIL) (2.5 MG/3ML) 0.083% nebulizer solution 2.5 mg  2.5 mg Nebulization Q4H PRN Karmen Bongo, MD      . bisacodyl (DULCOLAX) EC tablet 5 mg  5 mg Oral Daily PRN Karmen Bongo, MD   5 mg  at 09/07/16 1246  . carvedilol (COREG) tablet 50 mg  50 mg Oral BID WC Karmen Bongo, MD   50 mg at 09/07/16 0856  . diclofenac sodium (VOLTAREN) 1 % transdermal gel 4 g  4 g Topical QID PRN Karmen Bongo, MD      . diltiazem (DILACOR XR) 24 hr capsule 180 mg  180 mg Oral Daily Karmen Bongo, MD   180 mg at 09/07/16 1014  . docusate sodium (COLACE) capsule 100 mg  100 mg Oral BID Karmen Bongo, MD   100 mg at 09/07/16 1015  . enoxaparin (LOVENOX) injection 80 mg  80 mg Subcutaneous QHS Royetta Asal, RPH   80 mg at 09/06/16 2152  . furosemide (LASIX) tablet 80 mg  80 mg Oral BID [place] Tanna Furry, MD   80 mg at 09/07/16 0856  . gabapentin (NEURONTIN) capsule 300 mg  300 mg Oral QHS Karmen Bongo, MD   300 mg at 09/06/16 2151  . multivitamin with minerals tablet 1 tablet  1 tablet Oral Daily Karmen Bongo, MD   1 tablet at 09/07/16 1015  . omega-3 acid ethyl esters (LOVAZA) capsule 1 g  1 g Oral BID Karmen Bongo, MD   1 g at 09/07/16 1014  . ondansetron (ZOFRAN) tablet 4 mg  4 mg Oral Q6H PRN Karmen Bongo, MD       Or  . ondansetron [facility]) injection 4 mg  4 mg Intravenous Q6H PRN Karmen Bongo, MD      . oxyCODONE (Oxy IR/ROXICODONE) immediate release tablet 5 mg  5 mg Oral Q6H PRN Karmen Bongo, MD   5 mg at 09/07/16 0511  . polyethylene glycol (MIRALAX / GLYCOLAX) packet 17 g  17 g Oral Daily PRN Karmen Bongo, MD      . propafenone (RYTHMOL) tablet 150 mg  150 mg Oral Q8H Karmen Bongo, MD   150 mg at 09/07/16 1451  . sodium phosphate (FLEET) 7-19 GM/118ML enema 1 enema  1 enema Rectal Once PRN Karmen Bongo, MD         Discharge Medications: Please see discharge summary for a list of discharge medications.  Relevant Imaging Results:  Relevant Lab Results:   Additional Information [id number]  Lia Hopping, LCSW

## 2016-09-07 NOTE — Clinical Social Work Note (Signed)
Clinical Social Work Assessment  Patient Details  Name: Paige Marshall MRN: [id number] Date of Birth: [birth year]  Date of referral:  09/07/16               Reason for consult:  Facility Placement                Permission sought to share information with:  Facility Sport and exercise psychologist, Family Supports Permission granted to share information::  Yes, Verbal Permission Granted  Name::      Peri Jefferson  Agency::   [facility]  Relationship::   Significant Other  Contact Information:   [phone]  Housing/Transportation Living arrangements for the past 2 months:  [place] of Information:  Patient Patient Interpreter Needed:  None Criminal Activity/Legal Involvement Pertinent to Current Situation/Hospitalization:    Significant Relationships:  Adult Children Lives with:  Significant Other, Minor Children Do you feel safe going back to the place where you live?  Yes Need for family participation in patient care:  Yes (Comment)  Care giving concerns:  Patient reports during her last admission about two weeks ago she was offered SNF and declined at the time as she thought she could manage her care at home. Th e patient reports since going home her health has declined. The patient reports it is hard for her boyfriend and son to care for her at home.    Social Worker assessment / plan:  LCSWA met with patient at bedside and discussed patient disposition to SNF. LCSWA explained  the faxing out process to patient and 3 night qualifying stay inpatient for medicare to cover the cost. The patient reports she is interested in going to [facility] as she is familiar with the facility and has already spoken to the admission coordinator.  LCSWA will continue to assist with patient deposition to SNF, once patient is medically stable.   Employment status:    Insurance information:  Medicare PT Recommendations:  [place]  / Referral to community resources:  [place]  Patient/Family's Response to care:  Agreeable to patient going to [facility] for rehab.   Patient/Family's Understanding of and Emotional Response to Diagnosis, Current Treatment, and Prognosis:  Patient understands the SNF process and is hopeful rehab will go well so that she can get home to her family.   Emotional Assessment Appearance:  Appears stated age Attitude/Demeanor/Rapport:    Affect (typically observed):  Accepting, Calm Orientation:  Oriented to Self, Oriented to Place, Oriented to  Time, Oriented to Situation Alcohol / Substance use:  Not Applicable Psych involvement (Current and /or in the community):  No (Comment)  Discharge Needs  Concerns to be addressed:  Discharge Planning Concerns Readmission within the last 30 days:    Current discharge risk:  Dependent with Mobility Barriers to Discharge:  Continued Medical Work up   [employer], LCSW 09/07/2016, 4:41 PM

## 2016-09-08 NOTE — Progress Notes (Signed)
[name], female   DOB: 06/16/1956, 60 y.o.   MRN: [id number]    PROGRESS NOTE    RUTHE LUSTIG  [id number] DOB: 1956-07-19 DOA: 09/05/2016  PCP: Kerin Perna, NP   Brief Narrative:  60 y.o.femalewith medical history significant of NICM with EF 40-45% in April 2016, CKD stage 3 at baseline, PAF, HTN, morbid obesity, mixed type COPD. At baseline she uses O2 only at night, unable to tolerate CPAP. She was admitted from 11/18-21 for syncope, right heart failure with NYHA class 3, PAF, morbid obesity, left fibular fracture, and acute on chronic respiratory failure. She was recommended for SNF but she declined placement. She went home on 11/21. At home, she didn't feel well, felt congested and weakness. She is now interested to go to rehab.  Assessment & Plan: Chronic respiratory failure with hypoxia (HCC),multifactorial and secondary to COPD, OSA, acute diastolic CHF - Patient's oxygen requirement is same as home oxygen, 3 L.  - BNP mildly elevated, and mild crackles on exam  - pt has received two doses of IVF Lasix and currently on PO Lasix  - weight trend since admission: 338 --> 346 --> 343 lbs this AM - monitor daily weights, strict I?O  Essential hypertension - reasonable inpatient control  - ontinue Coreg, diltiazem, Lasix   PAF (paroxysmal atrial fibrillation) ([place]), CHADS 2 score 3-4 - she is not on anticoagulation due to h/o "excessive bleeding" per cardiology notes. - Continue propafenone and diltiazem  Morbid obesity (HCC) - Body mass index is 53.87 kg/m  CKD (chronic kidney disease) stage 3 - Serum creatinine level stable and at baseline 1.6 - Avoid nephrotoxins - BMP in AM  Left fibular fracture - Per Dr. Erlinda Hong from [facility], she is not a surgical candidate due to her chronic respiratory failure. - Patient is unable to weight bear and must be in CAM walker for 4-6 weeks. - She was recommended for SNF placement  during last hospitalization and declined - currently in agreement and placement in progress   Physical deconditioning due to above comorbidities - PT eval, SNF placement   Normocytic anemia, chronic kidney disease - Hg stable  DVT prophylaxis: Lovenox SQ Code Status: Full code Family Communication: No family present at bedside Disposition Plan: Likely discharge to SNF when bed available    Consultants:   None  Procedures:  None  Antimicrobials:   None   Subjective: No events overnight.   Objective: Vitals:   09/07/16 1530 09/07/16 2134 09/08/16 0444 09/08/16 1441  BP: (!) 113/98 (!) 96/58 122/63 109/60  Pulse: 70 74 93 62  Resp: 20 18 18 18   Temp: 97.9 F (36.6 C) 98 F (36.7 C) 98.1 F (36.7 C) 97.9 F (36.6 C)  TempSrc: Oral Oral Oral Oral  SpO2: 96% 98% 93% 94%  Weight:   (!) 156 kg (343 lb 14.7 oz)   Height:        Intake/Output Summary (Last 24 hours) at 09/08/16 1609 Last data filed at 09/08/16 1300  Gross per 24 hour  Intake              360 ml  Output              350 ml  Net               10 ml   Filed Weights   09/05/16 1823 09/06/16 0500 09/08/16 0444  Weight: (!) 153.3 kg (338 lb) (!) 157 kg (346 lb  2 oz) (!) 156 kg (343 lb 14.7 oz)    Examination:  General exam: Appears calm and comfortable  Respiratory system: Respiratory effort normal. Minimal bibasilar crackles, diminished breath sounds at bases  Cardiovascular system: S1 & S2 heard, RRR. No JVD, murmurs, rubs, gallops or clicks. Trace bilateral LE pedal edema Gastrointestinal system: Abdomen is nondistended, soft and nontender. No organomegaly or masses felt.  Central nervous system: Alert and oriented. No focal neurological deficits. Extremities: Symmetric 5 x 5 power. Skin: No rashes, lesions or ulcers  Data Reviewed: I have personally reviewed following labs and imaging studies  CBC:  Recent Labs Lab 09/05/16 1555 09/06/16 0514  WBC 9.3 8.3  NEUTROABS 7.6  --     HGB 9.8* 8.9*  HCT 34.2* 30.6*  MCV 90.7 89.2  PLT 220 [id number]   Basic Metabolic Panel:  Recent Labs Lab 09/05/16 1555 09/06/16 0514  NA 144 140  K 4.1 3.6  CL 96* 93*  CO2 37* 38*  GLUCOSE 96 133*  BUN 61* 55*  CREATININE 1.65* 1.62*  CALCIUM 9.2 8.9   Urine analysis:    Component Value Date/Time   COLORURINE YELLOW 04/04/2016 1206   APPEARANCEUR HAZY (A) 04/04/2016 1206   LABSPEC 1.011 04/04/2016 1206   PHURINE 6.5 04/04/2016 1206   GLUCOSEU NEGATIVE 04/04/2016 1206   HGBUR NEGATIVE 04/04/2016 1206   BILIRUBINUR NEGATIVE 04/04/2016 1206   KETONESUR NEGATIVE 04/04/2016 1206   PROTEINUR NEGATIVE 04/04/2016 1206   UROBILINOGEN 1.0 11/16/2009 0554   NITRITE NEGATIVE 04/04/2016 1206   LEUKOCYTESUR NEGATIVE 04/04/2016 1206   Radiology Studies: No results found.  Scheduled Meds: . carvedilol  50 mg Oral BID WC  . diltiazem  180 mg Oral Daily  . docusate sodium  100 mg Oral BID  . enoxaparin (LOVENOX) injection  80 mg Subcutaneous QHS  . furosemide  80 mg Oral BID  . gabapentin  300 mg Oral QHS  . multivitamin with minerals  1 tablet Oral Daily  . omega-3 acid ethyl esters  1 g Oral BID  . propafenone  150 mg Oral Q8H   Continuous Infusions:   LOS: 2 days   Time spent: 20 minutes   Faye Ramsay, MD [facility] Pager [phone]  If 7PM-7AM, please contact night-coverage [email or web address] Password [email or web address] 09/08/2016, 4:09 PM

## 2016-09-09 ENCOUNTER — Other Ambulatory Visit (HOSPITAL_BASED_OUTPATIENT_CLINIC_OR_DEPARTMENT_OTHER): Payer: Self-pay

## 2016-09-09 ENCOUNTER — Encounter (HOSPITAL_COMMUNITY): Payer: Self-pay | Admitting: *Deleted

## 2016-09-09 DIAGNOSIS — R0683 Snoring: Secondary | ICD-10-CM

## 2016-09-09 DIAGNOSIS — G4710 Hypersomnia, unspecified: Secondary | ICD-10-CM

## 2016-09-09 DIAGNOSIS — R5383 Other fatigue: Secondary | ICD-10-CM

## 2016-09-09 DIAGNOSIS — G4700 Insomnia, unspecified: Secondary | ICD-10-CM

## 2016-09-09 LAB — BASIC METABOLIC PANEL
ANION GAP: 7 (ref 5–15)
BUN: 67 mg/dL — ABNORMAL HIGH (ref 6–20)
CALCIUM: 8.9 mg/dL (ref 8.9–10.3)
CO2: 38 mmol/L — ABNORMAL HIGH (ref 22–32)
CREATININE: 2.14 mg/dL — AB (ref 0.44–1.00)
Chloride: 93 mmol/L — ABNORMAL LOW (ref 101–111)
GFR calc Af Amer: 28 mL/min — ABNORMAL LOW (ref >60)
GFR, EST NON AFRICAN AMERICAN: 24 mL/min — AB (ref >60)
GLUCOSE: 115 mg/dL — AB (ref 65–99)
Potassium: 4 mmol/L (ref 3.5–5.1)
Sodium: 138 mmol/L (ref 135–145)

## 2016-09-09 LAB — CBC
HCT: 26.8 % — ABNORMAL LOW (ref 36.0–46.0)
Hemoglobin: 8 g/dL — ABNORMAL LOW (ref 12.0–15.0)
MCH: 26.1 pg (ref 26.0–34.0)
MCHC: 29.9 g/dL — AB (ref 30.0–36.0)
MCV: 87.6 fL (ref 78.0–100.0)
PLATELETS: 177 10*3/uL (ref 150–400)
RBC: 3.06 MIL/uL — ABNORMAL LOW (ref 3.87–5.11)
RDW: 18.5 % — AB (ref 11.5–15.5)
WBC: 7.6 10*3/uL (ref 4.0–10.5)

## 2016-09-09 MED ORDER — FUROSEMIDE 40 MG PO TABS
60.0000 mg | ORAL_TABLET | Freq: Two times a day (BID) | ORAL | Status: DC
  Administered 2016-09-10 – 2016-09-12 (×6): 60 mg via ORAL
  Filled 2016-09-09 (×6): qty 1

## 2016-09-09 MED ORDER — ENOXAPARIN SODIUM 40 MG/0.4ML ~~LOC~~ SOLN
40.0000 mg | SUBCUTANEOUS | Status: DC
  Administered 2016-09-09 – 2016-09-13 (×5): 40 mg via SUBCUTANEOUS
  Filled 2016-09-09 (×5): qty 0.4

## 2016-09-09 NOTE — Progress Notes (Signed)
Physical Therapy Treatment Patient Details Name: Paige Marshall MRN: [id number] DOB: 1955-12-28 Today's Date: 09/09/2016    History of Present Illness Paige Marshall is a 60 y.o. 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 Pt DCed home and is now readmitted with acute on chronic respiratory failure.     PT Comments    Patient was seen in bed upon arrival with family present. Performed rolling toward the R x minA and supine to sit x modA to raise trunk and VC's to move LE over in bed. VC's to use bed rails to assist in raising trunk in bed. Sat on EOB x 5 min. Patient complained of minor lightheadedness/dizziness which subsided. Did not attempt sit to stand using a walker due to safety with patient maintaining NWB status on LLE. Used maximove lift over to recliner. Performed 10 long arc quads on LLE. Repositioned in recliner and elevated LLE.   Follow Up Recommendations  SNF;Supervision/Assistance - 24 hour     Equipment Recommendations       Recommendations for Other Services       Precautions / Restrictions Precautions Precautions: Fall Required Braces or Orthoses: Other Brace/Splint Other Brace/Splint: CAM boot Restrictions Weight Bearing Restrictions: Yes LLE Weight Bearing: Non weight bearing Other Position/Activity Restrictions: NWB on LLE    Mobility  Bed Mobility Overal bed mobility: Needs Assistance Bed Mobility: Supine to Sit;Rolling Rolling: Min assist   Supine to sit: Mod assist     General bed mobility comments: Use of bed rails to assist in rolling and raising trunk. VC's to move LE's over in bed. ModA  to raise trunk.   Transfers Overall transfer level: Needs assistance               General transfer comment: Sat on EOB. Did not attempt sit to stand due to safety with patient maintaining NWB status on LLE. Maximove to transfer to recliner.   Ambulation/Gait                 Stairs            Wheelchair Mobility    Modified Rankin (Stroke Patients Only)       Balance                                    Cognition Arousal/Alertness: Awake/alert Behavior During Therapy: WFL for tasks assessed/performed Overall Cognitive Status: Within Functional Limits for tasks assessed                      Exercises General Exercises - Lower Extremity Long Arc Quad: Left;10 reps;AROM    General Comments        Pertinent Vitals/Pain Pain Assessment: Faces Faces Pain Scale: Hurts a little bit Pain Location: L leg. Pain Descriptors / Indicators: Discomfort Pain Intervention(s): Repositioned;Monitored during session;Premedicated before session    Home Living                      Prior Function            PT Goals (current goals can now be found in the care plan section)  Progress towards PT goals: Progressing toward goals    Frequency    Min 5X/week      PT Plan Current plan remains appropriate    Co-evaluation             End of Session Equipment Utilized During Treatment: Oxygen Activity Tolerance: Patient tolerated treatment well Patient left: in chair;with call bell/phone within reach;with family/visitor present;with chair alarm set     Time: [id number] PT Time Calculation (min) (ACUTE ONLY): 17 min  Charges:  $Therapeutic Activity: 8-22 mins                    G CodesHall Busing, SPTA [facility] Acute Rehab [phone]  Present during session and agree  Rica Koyanagi  PTA [facility]  Acute  Rehab Pager      [phone]

## 2016-09-09 NOTE — Progress Notes (Signed)
[name], female   DOB: 10-19-1955, 60 y.o.   MRN: [id number]  PROGRESS NOTE    Paige Marshall  [id number] DOB: Oct 04, 1956 DOA: 09/05/2016  PCP: Kerin Perna, NP   Brief Narrative:   60 y.o.femalewith medical history significant for NICM with EF 40-45% in April 2016, CKD stage 3 at baseline, PAF, HTN, morbid obesity, mixed type COPD. At baseline she uses O2 only at night, unable to tolerate CPAP. She was hospitalized 11/18-21 for syncope, right heart failure with NYHA class 3, PAF, morbid obesity, left fibular fracture, and acute on chronic respiratory failure. She was recommended for SNF but she declined placement. She went home on 11/21. At home, she didn't feel well, felt congested and weak. Now accept SNF placement.  Barrier to discharge: Cr worse this am, lasix dose decreased so will recheck Cr tomorrow am.   Assessment & Plan:  Chronic respiratory failure with hypoxia (HCC),multifactorial and secondary to COPD, OSA, acute diastolic CHF - Patient's oxygen requirement is same as home oxygen, 3 L.  - BNP mildly elevated - On lasix  - Stable resp status this am - Daily weight and strict intake and output - Weight since 11/26 153.3 kg --> 156 kg  Essential hypertension - Cntinue Coreg, diltiazem, Lasix  PAF (paroxysmal atrioal fibrillation) ([place]), CHADS 2 score 3-4 - She is not on anticoagulation due to h/o "excessive bleeding" per cardiology notes. - Continue propafenone and diltiazem  Morbid obesity (HCC) - Body mass index is 53.87 kg/m  CKD (chronic kidney disease) stage 3 - Serum creatinine level stable and at baseline 1.6 - Cr up this am likely due to lasix - Lasix dose reduced from 80 mg BID to 60 mg BID - Recheck BMP in am  Left fibular fracture - Per [facility] Erlinda [facility] from [facility], she is not a surgical candidate due to her chronic respiratory failure. - Patient is unable to weight bear and must be in CAM walker  for 4-6 weeks. - She was recommended for SNF placement during last hospitalization, declined befor but now accepts placement to SNF   Physical deconditioning due to above comorbidities - PT eval, SNF placement   Normocytic anemia, anemia of chronic kidney disease - Hg stable at 8 - Follow up CBC in am  DVT prophylaxis: Lovenox SQ Code Status: Full code Family Communication: No family present at bedside Disposition Plan: D/C to SNF  Once bed available; D/C once Cr stabilize   Consultants:  None  Procedures:  None  Antimicrobials:   None    Subjective: No overnight events.   Objective: Vitals:   09/08/16 1441 09/08/16 2013 09/09/16 0617 09/09/16 1643  BP: 109/60 117/63 114/77 (!) 120/56  Pulse: 62 70 74 73  Resp: 18 18 18 18   Temp: 97.9 F (36.6 C) 97.5 F (36.4 C) 97.5 F (36.4 C) 97.9 F (36.6 C)  TempSrc: Oral Oral Oral Oral  SpO2: 94% 93% 96% 91%  Weight:      Height:        Intake/Output Summary (Last 24 hours) at 09/09/16 1830 Last data filed at 09/09/16 0500  Gross per 24 hour  Intake              160 ml  Output                0 ml  Net              160 ml   [employer]  09/05/16 1823 09/06/16 0500 09/08/16 0444  Weight: (!) 153.3 kg (338 lb) (!) 157 kg (346 lb 2 oz) (!) 156 kg (343 lb 14.7 oz)    Examination:  General exam: Appears calm and comfortable  Respiratory system: Diminished breath sounds, no wheezing  Cardiovascular system: S1 & S2 heard, RRR. No JVD, murmurs, rubs, gallops or clicks. No pedal edema. Gastrointestinal system: Abdomen is nondistended, soft and nontender. No organomegaly or masses felt. Normal bowel sounds heard. Central nervous system: Alert and oriented. No focal neurological deficits. Extremities:LE edema appreciated, palpable pulses  Skin: No rashes, lesions or ulcers Psychiatry: Judgement and insight appear normal. Mood & affect appropriate.   Data Reviewed: I have personally reviewed following labs  and imaging studies  CBC:  Recent Labs Lab 09/05/16 1555 09/06/16 0514 09/09/16 0549  WBC 9.3 8.3 7.6  NEUTROABS 7.6  --   --   HGB 9.8* 8.9* 8.0*  HCT 34.2* 30.6* 26.8*  MCV 90.7 89.2 87.6  PLT 220 221 [id number]   Basic Metabolic Panel:  Recent Labs Lab 09/05/16 1555 09/06/16 0514 09/09/16 0549  NA 144 140 138  K 4.1 3.6 4.0  CL 96* 93* 93*  CO2 37* 38* 38*  GLUCOSE 96 133* 115*  BUN 61* 55* 67*  CREATININE 1.65* 1.62* 2.14*  CALCIUM 9.2 8.9 8.9   GFR: Estimated Creatinine Clearance: 45 mL/min (by C-G formula based on SCr of 2.14 mg/dL (H)). Liver Function Tests: No results for input(s): AST, ALT, ALKPHOS, BILITOT, PROT, ALBUMIN in the last 168 hours. No results for input(s): LIPASE, AMYLASE in the last 168 hours. No results for input(s): AMMONIA in the last 168 hours. Coagulation Profile: No results for input(s): INR, PROTIME in the last 168 hours. Cardiac Enzymes: No results for input(s): CKTOTAL, CKMB, CKMBINDEX, TROPONINI in the last 168 hours. BNP (last 3 results) No results for input(s): PROBNP in the last 8760 hours. HbA1C: No results for input(s): HGBA1C in the last 72 hours. CBG: No results for input(s): GLUCAP in the last 168 hours. Lipid Profile: No results for input(s): CHOL, HDL, LDLCALC, TRIG, CHOLHDL, LDLDIRECT in the last 72 hours. Thyroid Function Tests: No results for input(s): TSH, T4TOTAL, FREET4, T3FREE, THYROIDAB in the last 72 hours. Anemia Panel: No results for input(s): VITAMINB12, FOLATE, FERRITIN, TIBC, IRON, RETICCTPCT in the last 72 hours. Urine analysis:    Component Value Date/Time   COLORURINE YELLOW 04/04/2016 1206   APPEARANCEUR HAZY (A) 04/04/2016 1206   LABSPEC 1.011 04/04/2016 1206   PHURINE 6.5 04/04/2016 1206   GLUCOSEU NEGATIVE 04/04/2016 1206   HGBUR NEGATIVE 04/04/2016 1206   BILIRUBINUR NEGATIVE 04/04/2016 1206   KETONESUR NEGATIVE 04/04/2016 1206   PROTEINUR NEGATIVE 04/04/2016 1206   UROBILINOGEN 1.0 11/16/2009  0554   NITRITE NEGATIVE 04/04/2016 1206   LEUKOCYTESUR NEGATIVE 04/04/2016 1206   Sepsis Labs: @LABRCNTIP (procalcitonin:4,lacticidven:4)   )No results found for this or any previous visit (from the past 240 hour(s)).    Radiology Studies: No results found.   Scheduled Meds: . carvedilol  50 mg Oral BID WC  . diltiazem  180 mg Oral Daily  . docusate sodium  100 mg Oral BID  . enoxaparin (LOVENOX) injection  40 mg Subcutaneous Q24H  . furosemide  80 mg Oral BID  . gabapentin  300 mg Oral QHS  . multivitamin with minerals  1 tablet Oral Daily  . omega-3 acid ethyl esters  1 g Oral BID  . propafenone  150 mg Oral Q8H   Continuous Infusions:  LOS: 3 days    Time spent: 25 minutes Greater than 50% of the time spent on counseling and coordinating the care.   Leisa Lenz, MD [facility] Pager [phone]  If 7PM-7AM, please contact night-coverage [email or web address] Password [email or web address] 09/09/2016, 6:30 PM

## 2016-09-09 NOTE — Progress Notes (Signed)
Agree with earlier shift assessment for first part shift. No changes noted. Will continue to monitor.

## 2016-09-10 ENCOUNTER — Encounter (HOSPITAL_COMMUNITY): Payer: Self-pay | Admitting: Internal Medicine

## 2016-09-10 DIAGNOSIS — J449 Chronic obstructive pulmonary disease, unspecified: Secondary | ICD-10-CM

## 2016-09-10 DIAGNOSIS — E662 Morbid (severe) obesity with alveolar hypoventilation: Secondary | ICD-10-CM

## 2016-09-10 DIAGNOSIS — G4733 Obstructive sleep apnea (adult) (pediatric): Secondary | ICD-10-CM

## 2016-09-10 DIAGNOSIS — S82402E Unspecified fracture of shaft of left fibula, subsequent encounter for open fracture type I or II with routine healing: Secondary | ICD-10-CM

## 2016-09-10 DIAGNOSIS — N183 Chronic kidney disease, stage 3 (moderate): Secondary | ICD-10-CM

## 2016-09-10 DIAGNOSIS — I5033 Acute on chronic diastolic (congestive) heart failure: Secondary | ICD-10-CM

## 2016-09-10 DIAGNOSIS — I130 Hypertensive heart and chronic kidney disease with heart failure and stage 1 through stage 4 chronic kidney disease, or unspecified chronic kidney disease: Principal | ICD-10-CM

## 2016-09-10 LAB — CBC
HCT: 26.5 % — ABNORMAL LOW (ref 36.0–46.0)
Hemoglobin: 7.8 g/dL — ABNORMAL LOW (ref 12.0–15.0)
MCH: 26.1 pg (ref 26.0–34.0)
MCHC: 29.4 g/dL — AB (ref 30.0–36.0)
MCV: 88.6 fL (ref 78.0–100.0)
PLATELETS: 188 10*3/uL (ref 150–400)
RBC: 2.99 MIL/uL — AB (ref 3.87–5.11)
RDW: 18.9 % — AB (ref 11.5–15.5)
WBC: 8.1 10*3/uL (ref 4.0–10.5)

## 2016-09-10 LAB — BASIC METABOLIC PANEL
Anion gap: 11 (ref 5–15)
BUN: 77 mg/dL — AB (ref 6–20)
CALCIUM: 8.8 mg/dL — AB (ref 8.9–10.3)
CO2: 35 mmol/L — ABNORMAL HIGH (ref 22–32)
CREATININE: 2.55 mg/dL — AB (ref 0.44–1.00)
Chloride: 90 mmol/L — ABNORMAL LOW (ref 101–111)
GFR calc Af Amer: 23 mL/min — ABNORMAL LOW (ref >60)
GFR, EST NON AFRICAN AMERICAN: 20 mL/min — AB (ref >60)
GLUCOSE: 124 mg/dL — AB (ref 65–99)
POTASSIUM: 4.2 mmol/L (ref 3.5–5.1)
SODIUM: 136 mmol/L (ref 135–145)

## 2016-09-10 MED ORDER — FUROSEMIDE 10 MG/ML IJ SOLN
INTRAMUSCULAR | Status: AC
  Filled 2016-09-10: qty 4

## 2016-09-10 MED ORDER — FUROSEMIDE 10 MG/ML IJ SOLN
40.0000 mg | INTRAMUSCULAR | Status: AC
  Administered 2016-09-10: 40 mg via INTRAVENOUS

## 2016-09-10 NOTE — Care Management Important Message (Signed)
Important Message  Patient Details  Name: Paige Marshall MRN: [id number] Date of Birth: 03-31-1956   Medicare Important Message Given:  Yes    Camillo Flaming 09/10/2016, 12:09 [place] Message  Patient Details  Name: Paige Marshall MRN: [id number] Date of Birth: 1955/11/01   Medicare Important Message Given:  Yes    Camillo Flaming 09/10/2016, 12:08 PM

## 2016-09-10 NOTE — Progress Notes (Addendum)
Pt called me in room c/o shortness of breath while sitting upright in bed. O2 sats dropped to 84%. Resp 15. Crackles noted on assessment lower bases. Increased O2 from 3 to 4 liters. Resp. Called for breathing treatment. Paged MD. Lasix 40mg  IV ordered placed and given. Will continue to monitor.

## 2016-09-10 NOTE — Progress Notes (Signed)
Patient has bed at [facility] when medically stable.  LCSWA will continue to assist with disposition to SNF.   Kathrin Greathouse, Latanya Presser, MSW Clinical Social Worker [facility] [phone]

## 2016-09-10 NOTE — Progress Notes (Signed)
Progress Note    Paige Marshall  [id number] DOB: 06/03/1956  DOA: 09/05/2016 PCP: Kerin Perna, NP    Brief Narrative:   Chief complaint: Follow-up shortness of breath  Paige Marshall is an 60 y.o. female with medical history significant for NICM with EF 40-45% in April 2016, CKD stage 3 at baseline, PAF, HTN, morbid obesity, mixed type COPD. At baseline she uses O2 only at night, unable to tolerate CPAP. She was hospitalized 11/18-21 for syncope, right heart failure with NYHA class 3, PAF, morbid obesity, left fibular fracture, and acute on chronic respiratory failure. She was recommended for SNF but she declined placement. She went home on 08/31/16. At home, she didn't feel well, felt congested and weak. Now accept SNF placement. Barrier to discharge: Cr worse this am, lasix dose decreased so will recheck Cr tomorrow am. Creatinine not improve but had acute pulmonary edema with decrease in Lasix dose.  Assessment/Plan:   Principal problem:  Acute on Chronic respiratory failure with hypoxia (HCC),multifactorial and secondary to COPD, OSA, acute on chronic diastolic CHF - Continue supplemental oxygen. - BNP mildly elevated. 2-D echo done 08/30/16, EF 50-55 percent. - Suspect OSA is a significant contributor of her chronic respiratory failure however she refuses CPAP. - Required 40 mg of IV Lasix today for acute pulmonary edema. Also received a breathing treatment. - Unfortunately, creatinine continues to rise making it unsafe to discharge her at this time.  Active problems:  Essential hypertension - Continue Coreg, diltiazem, Lasix.  PAF (paroxysmal atrioal fibrillation) ([place]), CHADS 2 score 3-4 - She is not on anticoagulation due to h/o "excessive bleeding" per cardiology notes. - Continue propafenone and diltiazem..  Morbid obesity ([place]) - Body mass index is 53.87 kg/m.  CKD (chronic kidney disease) stage 3 - Serum creatinine continues to rise  despite adjustment of Lasix.  Left fibular fracture - Per Dr. Erlinda Hong from [facility], she is not a surgical candidate due to her chronic respiratory failure. - Patient is unable to weight bear and must be in CAM walker for 4-6 weeks. - She was recommended for SNF placement during last hospitalization, declined before but now accepts.  Physical deconditioning due to above comorbidities - PT eval, SNF placement.   Normocytic anemia, anemia of chronic kidney disease - Hemoglobin trending down, continue to monitor.  Family Communication/Anticipated D/C date and plan/Code Status   DVT prophylaxis: Lovenox ordered. Code Status: Full Code.  Family Communication: Family at bedside. Disposition Plan: SNF placement when creatinine stabilizes.   Medical Consultants:    None.   Procedures:    None  Anti-Infectives:    None  Subjective:   The patient reports that she became "hot "then got dyspneic. Review of symptoms is positive for mild lethargy and negative for chest pain, nausea, vomiting.  Objective:    Vitals:   09/09/16 2111 09/10/16 0447 09/10/16 0934 09/10/16 0936  BP: 113/76 133/80 (!) 103/57   Pulse: 67 72    Resp: 18 18 15    Temp: 98 F (36.7 C) 97.8 F (36.6 C)    TempSrc: Oral Oral    SpO2: 95% 94% (!) 84% 92%  Weight:      Height:       No intake or output data in the 24 hours ending 09/10/16 0944 Filed Weights   09/05/16 1823 09/06/16 0500 09/08/16 0444  Weight: (!) 153.3 kg (338 lb) (!) 157 kg (346 lb 2 oz) (!) 156 kg (343 lb 14.7 oz)  Exam: General exam: Morbidly obese female who falls asleep easily during my interview/assessment. Respiratory system: Bilateral crackles. Increased work of breathing. Cardiovascular system: S1 & S2 heard, RRR. No JVD,  rubs, gallops or clicks. No murmurs. Gastrointestinal system: Abdomen is nondistended, soft and nontender. No organomegaly or masses felt. Normal bowel sounds heard. Central nervous  system: Lethargic and oriented. No focal neurological deficits. Extremities: No clubbing,  or cyanosis. 2+ edema. Skin: No rashes, lesions or ulcers. Psychiatry: Judgement and insight appear diminished. Mood & affect flat.   Data Reviewed:   I have personally reviewed following labs and imaging studies:  Labs: Basic Metabolic Panel:  Recent Labs Lab 09/05/16 1555 09/06/16 0514 09/09/16 0549 09/10/16 0520  NA 144 140 138 136  K 4.1 3.6 4.0 4.2  CL 96* 93* 93* 90*  CO2 37* 38* 38* 35*  GLUCOSE 96 133* 115* 124*  BUN 61* 55* 67* 77*  CREATININE 1.65* 1.62* 2.14* 2.55*  CALCIUM 9.2 8.9 8.9 8.8*   GFR Estimated Creatinine Clearance: 37.7 mL/min (by C-G formula based on SCr of 2.55 mg/dL (H)). Liver Function Tests: No results for input(s): AST, ALT, ALKPHOS, BILITOT, PROT, ALBUMIN in the last 168 hours. No results for input(s): LIPASE, AMYLASE in the last 168 hours. No results for input(s): AMMONIA in the last 168 hours. Coagulation profile No results for input(s): INR, PROTIME in the last 168 hours.  CBC:  Recent Labs Lab 09/05/16 1555 09/06/16 0514 09/09/16 0549 09/10/16 0520  WBC 9.3 8.3 7.6 8.1  NEUTROABS 7.6  --   --   --   HGB 9.8* 8.9* 8.0* 7.8*  HCT 34.2* 30.6* 26.8* 26.5*  MCV 90.7 89.2 87.6 88.6  PLT 220 221 177 188   Cardiac Enzymes: No results for input(s): CKTOTAL, CKMB, CKMBINDEX, TROPONINI in the last 168 hours. BNP (last 3 results) No results for input(s): PROBNP in the last 8760 hours. CBG: No results for input(s): GLUCAP in the last 168 hours. D-Dimer: No results for input(s): DDIMER in the last 72 hours. Hgb A1c: No results for input(s): HGBA1C in the last 72 hours. Lipid Profile: No results for input(s): CHOL, HDL, LDLCALC, TRIG, CHOLHDL, LDLDIRECT in the last 72 hours. Thyroid function studies: No results for input(s): TSH, T4TOTAL, T3FREE, THYROIDAB in the last 72 hours.  Invalid input(s): FREET3 Anemia work up: No results for  input(s): VITAMINB12, FOLATE, FERRITIN, TIBC, IRON, RETICCTPCT in the last 72 hours. Sepsis Labs:  Recent Labs Lab 09/05/16 1555 09/06/16 0514 09/09/16 0549 09/10/16 0520  WBC 9.3 8.3 7.6 8.1    Microbiology No results found for this or any previous visit (from the past 240 hour(s)).  Radiology: No results found.  Medications:   . carvedilol  50 mg Oral BID WC  . diltiazem  180 mg Oral Daily  . docusate sodium  100 mg Oral BID  . enoxaparin (LOVENOX) injection  40 mg Subcutaneous Q24H  . furosemide  60 mg Oral BID  . gabapentin  300 mg Oral QHS  . multivitamin with minerals  1 tablet Oral Daily  . omega-3 acid ethyl esters  1 g Oral BID  . propafenone  150 mg Oral Q8H   Continuous Infusions:  Medical decision making is of high complexity and this patient is at high risk of deterioration, therefore this is a level 3 visit.    LOS: 4 days   [facility] Pager [phone]. If unable to reach me by pager, please call my cell phone at [phone].  *  Please refer to [email or web address], password [email or web address] to get updated schedule on who will round on this patient, as hospitalists switch teams weekly. If 7PM-7AM, please contact night-coverage at [email or web address], password [email or web address] for any overnight needs.  09/10/2016, 9:44 AM

## 2016-09-11 DIAGNOSIS — N189 Chronic kidney disease, unspecified: Secondary | ICD-10-CM

## 2016-09-11 DIAGNOSIS — N179 Acute kidney failure, unspecified: Secondary | ICD-10-CM

## 2016-09-11 LAB — CBC
HEMATOCRIT: 26.1 % — AB (ref 36.0–46.0)
Hemoglobin: 7.7 g/dL — ABNORMAL LOW (ref 12.0–15.0)
MCH: 25.4 pg — ABNORMAL LOW (ref 26.0–34.0)
MCHC: 29.5 g/dL — AB (ref 30.0–36.0)
MCV: 86.1 fL (ref 78.0–100.0)
PLATELETS: 216 10*3/uL (ref 150–400)
RBC: 3.03 MIL/uL — ABNORMAL LOW (ref 3.87–5.11)
RDW: 18.8 % — AB (ref 11.5–15.5)
WBC: 8.3 10*3/uL (ref 4.0–10.5)

## 2016-09-11 NOTE — Progress Notes (Signed)
Chaplain visit the result of an Charted [place] to create a [place]. Ms Kearn did not seem to know what a HCPOA was and at first avoided discussing the matter because she thought it would cost her or her insurer money. When the chaplain related that the creation of a HCPOA was a service provided by the hospital and that chaplain time is not billable, Ms Rosengarten was interested in hearing more. After a description of the form and the implications for her health care, Ms Fabio Neighbors requested that the form be left with her so she could read it and discuss it with others. She asked the chaplain to visit the next day (Sunday, Sep 12 2016) for an update on her decision.  Please page the chaplain if Ms Orellana wishes or needs further spiritual or emotional care.  Sallee Lange. Breon Rehm, DMin, MDiv Chaplain

## 2016-09-11 NOTE — Progress Notes (Signed)
[name], female   DOB: 03/03/1956, 60 y.o.   MRN: [id number]  PROGRESS NOTE    CHAVY COMRIE  [id number] DOB: [birth year] DOA: 09/05/2016  PCP: Kerin Perna, NP   Brief Narrative:  60 y.o. female with medical history significant forNICM with EF 40-45% in April 2016, CKD stage 3 at baseline, PAF, HTN, morbid obesity, mixed type COPD. At baseline she uses O2 only at night, unable to tolerate CPAP. She was hospitalized 11/18-21 for syncope, right heart failure with NYHA class 3, PAF, morbid obesity, left fibular fracture, and acute on chronic respiratory failure. She was recommended for SNF but she declined placement. She went home on 08/31/16. At home, she didn't feel well, felt congested and weak. Now accept SNF placement. Barrier to discharge: Cr worse this am, lasix dose decreased so will recheck Cr tomorrow am. Creatinine not improve but had acute pulmonary edema with decrease in Lasix dose.  Assessment & Plan:   Principal problem:  Acute on Chronic respiratory failure with hypoxia (HCC),multifactorial and secondary to COPD, OSA, acute on chronic diastolic CHF - Suspect OSA is a significant contributor of her chronic respiratory failure however she refuses CPAP. - BNP mildly elevated. 2-D echo done 08/30/16, EF 50-55 percent. - Required 40 mg of IV Lasix for acute pulmonary edema. Lasix dose now 60 mg BID instead of 80 mg BID due to rising Cr  Active problems:  Essential hypertension - Continue Coreg, diltiazem, Lasix.  PAF (paroxysmal atrioal fibrillation) ([place]), CHADS 2 score 3-4 - She is not on anticoagulation due to h/o "excessive bleeding" per cardiology notes. - Continue propafenone and diltiazem..  Morbid obesity due to excess calories (HCC) - Body mass index is 53.87 kg/m - Counseled on diet   CKD (chronic kidney disease) stage 3 - Serum creatinine continues to rise despite adjustment of Lasix. - Follow up BMP in  am  Left fibular fracture - Per Dr. Erlinda Hong from [facility], she is not a surgical candidate due to her chronic respiratory failure. - Patient is unable to weight bear and must be in CAM walker for 4-6 weeks. - She was recommended for SNF placement during last hospitalization, declined before but now accepts.  Physical deconditioning due to above comorbidities - PT eval, SNF placement.   Normocytic anemia, anemia of chronic kidney disease - Hemoglobin 7.7 today - Follow up Hgb in am    DVT prophylaxis: Lovenox ordered. Code Status: Full Code.  Family Communication: Family at bedside. Disposition Plan: SNF placement when creatinine stabilizes.   Medical Consultants:  None.  Procedures:  None  Anti-Infectives:  None    Subjective: No overnight events.   Objective: Vitals:   09/10/16 0945 09/10/16 1348 09/10/16 2030 09/11/16 0421  BP:  93/65 98/61 107/67  Pulse:  61 (!) 57 61  Resp:  16 18 18   Temp:  97.7 F (36.5 C) 97.5 F (36.4 C) 98.4 F (36.9 C)  TempSrc:  Oral Oral Oral  SpO2: 93% 96% 96% 97%  Weight:      Height:        Intake/Output Summary (Last 24 hours) at 09/11/16 1421 Last data filed at 09/11/16 0703  Gross per 24 hour  Intake              120 ml  Output              425 ml  Net             -305 ml  Filed Weights   09/05/16 1823 09/06/16 0500 09/08/16 0444  Weight: (!) 153.3 kg (338 lb) (!) 157 kg (346 lb 2 oz) (!) 156 kg (343 lb 14.7 oz)    Examination:  General exam: Appears calm and comfortable  Respiratory system: Clear to auscultation. Respiratory effort normal. Cardiovascular system: S1 & S2 heard, Rate controlled  Gastrointestinal system: Abdomen is nondistended, soft and nontender. No organomegaly or masses felt. Normal bowel sounds heard. Central nervous system: Alert and oriented. No focal neurological deficits. Extremities: Pedal edema +1-2, palpable pulses  Skin: No rashes, lesions or ulcers Psychiatry:  Judgement and insight appear normal. Mood & affect appropriate.   Data Reviewed: I have personally reviewed following labs and imaging studies  CBC:  Recent Labs Lab 09/05/16 1555 09/06/16 0514 09/09/16 0549 09/10/16 0520 09/11/16 0546  WBC 9.3 8.3 7.6 8.1 8.3  NEUTROABS 7.6  --   --   --   --   HGB 9.8* 8.9* 8.0* 7.8* 7.7*  HCT 34.2* 30.6* 26.8* 26.5* 26.1*  MCV 90.7 89.2 87.6 88.6 86.1  PLT 220 221 177 188 [id number]   Basic Metabolic Panel:  Recent Labs Lab 09/05/16 1555 09/06/16 0514 09/09/16 0549 09/10/16 0520  NA 144 140 138 136  K 4.1 3.6 4.0 4.2  CL 96* 93* 93* 90*  CO2 37* 38* 38* 35*  GLUCOSE 96 133* 115* 124*  BUN 61* 55* 67* 77*  CREATININE 1.65* 1.62* 2.14* 2.55*  CALCIUM 9.2 8.9 8.9 8.8*   GFR: Estimated Creatinine Clearance: 37.7 mL/min (by C-G formula based on SCr of 2.55 mg/dL (H)). Liver Function Tests: No results for input(s): AST, ALT, ALKPHOS, BILITOT, PROT, ALBUMIN in the last 168 hours. No results for input(s): LIPASE, AMYLASE in the last 168 hours. No results for input(s): AMMONIA in the last 168 hours. Coagulation Profile: No results for input(s): INR, PROTIME in the last 168 hours. Cardiac Enzymes: No results for input(s): CKTOTAL, CKMB, CKMBINDEX, TROPONINI in the last 168 hours. BNP (last 3 results) No results for input(s): PROBNP in the last 8760 hours. HbA1C: No results for input(s): HGBA1C in the last 72 hours. CBG: No results for input(s): GLUCAP in the last 168 hours. Lipid Profile: No results for input(s): CHOL, HDL, LDLCALC, TRIG, CHOLHDL, LDLDIRECT in the last 72 hours. Thyroid Function Tests: No results for input(s): TSH, T4TOTAL, FREET4, T3FREE, THYROIDAB in the last 72 hours. Anemia Panel: No results for input(s): VITAMINB12, FOLATE, FERRITIN, TIBC, IRON, RETICCTPCT in the last 72 hours. Urine analysis:    Component Value Date/Time   COLORURINE YELLOW 04/04/2016 1206   APPEARANCEUR HAZY (A) 04/04/2016 1206   LABSPEC 1.011  04/04/2016 1206   PHURINE 6.5 04/04/2016 1206   GLUCOSEU NEGATIVE 04/04/2016 1206   HGBUR NEGATIVE 04/04/2016 1206   BILIRUBINUR NEGATIVE 04/04/2016 1206   KETONESUR NEGATIVE 04/04/2016 1206   PROTEINUR NEGATIVE 04/04/2016 1206   UROBILINOGEN 1.0 11/16/2009 0554   NITRITE NEGATIVE 04/04/2016 1206   LEUKOCYTESUR NEGATIVE 04/04/2016 1206   Sepsis Labs: @LABRCNTIP (procalcitonin:4,lacticidven:4)   )No results found for this or any previous visit (from the past 240 hour(s)).    Radiology Studies: No results found.      Scheduled Meds: . carvedilol  50 mg Oral BID WC  . diltiazem  180 mg Oral Daily  . docusate sodium  100 mg Oral BID  . enoxaparin (LOVENOX) injection  40 mg Subcutaneous Q24H  . furosemide  60 mg Oral BID  . gabapentin  300 mg Oral QHS  . multivitamin with  minerals  1 tablet Oral Daily  . omega-3 acid ethyl esters  1 g Oral BID  . propafenone  150 mg Oral Q8H   Continuous Infusions:   LOS: 5 days    Time spent: 15 minutes  Greater than 50% of the time spent on counseling and coordinating the care.   Leisa Lenz, MD [facility] Pager [phone]  If 7PM-7AM, please contact night-coverage [email or web address] Password [email or web address] 09/11/2016, 2:21 PM

## 2016-09-12 ENCOUNTER — Encounter (HOSPITAL_COMMUNITY): Payer: Self-pay

## 2016-09-12 LAB — BASIC METABOLIC PANEL
ANION GAP: 12 (ref 5–15)
BUN: 88 mg/dL — ABNORMAL HIGH (ref 6–20)
CHLORIDE: 88 mmol/L — AB (ref 101–111)
CO2: 33 mmol/L — ABNORMAL HIGH (ref 22–32)
Calcium: 8.8 mg/dL — ABNORMAL LOW (ref 8.9–10.3)
Creatinine, Ser: 2.68 mg/dL — ABNORMAL HIGH (ref 0.44–1.00)
GFR calc Af Amer: 21 mL/min — ABNORMAL LOW (ref >60)
GFR, EST NON AFRICAN AMERICAN: 18 mL/min — AB (ref >60)
GLUCOSE: 124 mg/dL — AB (ref 65–99)
POTASSIUM: 4.6 mmol/L (ref 3.5–5.1)
SODIUM: 133 mmol/L — AB (ref 135–145)

## 2016-09-12 LAB — CBC
HEMATOCRIT: 26 % — AB (ref 36.0–46.0)
HEMOGLOBIN: 7.7 g/dL — AB (ref 12.0–15.0)
MCH: 26.3 pg (ref 26.0–34.0)
MCHC: 29.6 g/dL — ABNORMAL LOW (ref 30.0–36.0)
MCV: 88.7 fL (ref 78.0–100.0)
Platelets: 194 10*3/uL (ref 150–400)
RBC: 2.93 MIL/uL — ABNORMAL LOW (ref 3.87–5.11)
RDW: 19.2 % — ABNORMAL HIGH (ref 11.5–15.5)
WBC: 7.9 10*3/uL (ref 4.0–10.5)

## 2016-09-12 NOTE — Consult Note (Signed)
Renal Service Consult Note [facility]  Paige Marshall 09/12/2016 [place] Requesting Physician:  Dr. Charlies Silvers  Reason for Consult:  AKI HPI: The patient is a 60 y.o. year-old with history of CVA, RA, pHTN, PAF, OSA, NICM, morbid obesity, HTN, HL, gout, DJD, COPD, CKDIII, combined CHF, asthma presented on 11/26 with c/o SOB and low SpO2. Felt to have a/c resp failure from diast / syst CHF, COPD/ OSA.  BNP up slightly.  Got IV then po lasix for "pulm edema".  Creat rising now, asked to see for acute/ chron renal failure.   Patient bed-bound, joking around, no c/o's.  No SOB or cough.   INpatient meds > coreg, diltiazem, colace, lovenox, IV then po lasix now 60/d, neurontin, duoneb, morphine, MVI, omega 3, Rythmol, tylenol, oxycodone.  BP's mostly normal but has had some BP drops into the 90's during the day sometimes.   Date  Creat  eGFR  CKD stage 2010- 11 0.8- 1.1 2012  1.0 2015  2.80 Apr '16  1.20- 1.66 33- 50  III Dec '16 1.64- 1.88 29- 34  IIIb May '17  1.79- 3.79 Jun '17  1.56- 1.65 33- 36  IIIb Nov '17 2.2- 2.5 Now   1.65 > 2.68 18 -33  IIIb- IV    Old chart: 11/18 - 09/01/16 >> presented w a syncopal episode, hypoxemia and a fall with L ankle fracture.  Syncope was felt to be due to hypoxemia , less likely arrhythmia. She has R HF from chronic L CHF and showed severely dilated RV which was new compared to 2 yrs prior.  LE dopplers for DVT were negative. Couldn't due CT angio to r/o PE due to anxiety of MRI, but team felt PE unlikely.  Had CKD IV.  Had WCT felt to be afib with aberrancy.  Was going to be started on coumadin but had bleeding episode so that was stopped.  Slept better with CPAP for OSA.  Ortho said non-op Rx of ankle fx.    CKD IV creat 2.4, referral to neph as OP.          ROS  denies CP  no joint pain   no HA  no blurry vision  no rash  no diarrhea  no nausea/ vomiting  no dysuria  no difficulty voiding  no change in  urine color    Past Medical History  Past Medical History:  Diagnosis Date  . Arthritis    "both knees" (01/14/2015)  . Asthma   . Chronic combined systolic and diastolic CHF (congestive heart failure) ([place])   . CKD (chronic kidney disease), stage III   . COPD (chronic obstructive pulmonary disease) ([place])   . Degenerative joint disease of both lower legs   . Edema    Lower extremity  . Former smoker   . Gout    hx in "both knees" (01/14/2015)  . H. pylori infection 01/03/2014   +breath test.    . High cholesterol   . Hypertension   . Morbid obesity ([place])   . NICM (nonischemic cardiomyopathy) ([place])    a. LHC 01/2015: normal cors, EF 45-50%. b. EF 50-55% in 08/2016.  [name] Nonischemic cardiomyopathy ([place])   . Normal coronary arteries   . OSA (obstructive sleep apnea)    "never sent CPAP out" (01/14/2015)  . Paroxysmal atrial fibrillation (HCC)   . Pneumonia X 2  . Pulmonary hypertension   . Respiratory failure ([place])   . Rheumatoid arthritis ([place])   .  Stroke [facility])    Past Surgical History  Past Surgical History:  Procedure Laterality Date  . ANGIOGRAM/LV (CONGENITAL)  2007  . BREATH TEK H PYLORI N/A 12/31/2013   Procedure: [place];  Surgeon: Gayland Curry, MD;  Location: [facility];  Service: General;  Laterality: N/A;  . CORNEAL TRANSPLANT Right ~ 2010  . CORONARY ANGIOPLASTY WITH STENT PLACEMENT  11/04/2005   "1"  . DOPPLER ECHOCARDIOGRAPHY     2 D   EF of 45%  . EYE SURGERY    . LEFT HEART CATHETERIZATION WITH CORONARY ANGIOGRAM N/A 01/20/2015   Procedure: LEFT HEART CATHETERIZATION WITH CORONARY ANGIOGRAM;  Surgeon: Lorretta Harp, MD;  Location: [facility] C[facility] LAB;  Service: Cardiovascular;  Laterality: N/A;  . sleep study  2011  . stress myocardial dipyridamole perfusion    . TUBAL LIGATION  1982  . venous duplex ultrasound  2013   Family History  Family History  Problem Relation Age of Onset  . Heart disease Mother   . Cancer Father     colon  . Hypertension  Other    Social History  reports that she quit smoking about 7 years ago. Her smoking use included Cigarettes. She has a 11.55 pack-year smoking history. She has never used smokeless tobacco. She reports that she drinks about 4.8 oz of alcohol per week . She reports that she does not use drugs. Allergies  Allergies  Allergen Reactions  . Penicillins Hives, Itching, Swelling and Other (See Comments)    Reaction:  Tongue swelling Has patient had a PCN reaction causing immediate rash, facial/tongue/throat swelling, SOB or lightheadedness with hypotension: Yes Has patient had a PCN reaction causing severe rash involving mucus membranes or skin necrosis: No Has patient had a PCN reaction that required hospitalization No Has patient had a PCN reaction occurring within the last 10 years: No If all of the above answers are "NO", then may proceed with Cephalosporin use.  . Citrus Hives   Home medications Prior to Admission medications   Medication Sig Start Date End Date Taking? Authorizing Provider  albuterol (PROVENTIL HFA;VENTOLIN HFA) 108 (90 Base) MCG/ACT inhaler Inhale 2 puffs into the lungs every 6 (six) hours as needed for wheezing or shortness of breath. 10/24/15  Yes Deneise Lever, MD  carvedilol (COREG) 25 MG tablet Take 50 mg by mouth 2 (two) times daily with a meal.   Yes Historical Provider, MD  diclofenac sodium (VOLTAREN) 1 % GEL Apply 4 g topically 4 (four) times daily as needed (for pain).   Yes Historical Provider, MD  diltiazem (DILACOR XR) 180 MG 24 hr capsule Take 180 mg by mouth daily.   Yes Historical Provider, MD  furosemide (LASIX) 80 MG tablet Take 80 mg by mouth 2 (two) times daily.   Yes Historical Provider, MD  gabapentin (NEURONTIN) 300 MG capsule Take 1 capsule (300 mg total) by mouth at bedtime. 08/31/16  Yes Janece Canterbury, MD  Multiple Vitamin (MULTIVITAMIN WITH MINERALS) TABS tablet Take 1 tablet by mouth daily.   Yes Historical Provider, MD  omega-3 acid ethyl  esters (LOVAZA) 1 g capsule Take 1 g by mouth 2 (two) times daily.   Yes Historical Provider, MD  oxyCODONE (OXY IR/ROXICODONE) 5 MG immediate release tablet Take 1 tablet (5 mg total) by mouth every 6 (six) hours as needed for breakthrough pain. 08/31/16  Yes Janece Canterbury, MD  propafenone (RYTHMOL) 150 MG tablet Take 150 mg by mouth every 8 (eight) hours.  Yes Historical Provider, MD   Liver Function Tests No results for input(s): AST, ALT, ALKPHOS, BILITOT, PROT, ALBUMIN in the last 168 hours. No results for input(s): LIPASE, AMYLASE in the last 168 hours. CBC  Recent Labs Lab 09/10/16 0520 09/11/16 0546 09/12/16 0540  WBC 8.1 8.3 7.9  HGB 7.8* 7.7* 7.7*  HCT 26.5* 26.1* 26.0*  MCV 88.6 86.1 88.7  PLT 188 216 466   Basic Metabolic Panel  Recent Labs Lab 09/06/16 0514 09/09/16 0549 09/10/16 0520 09/12/16 0540  NA 140 138 136 133*  K 3.6 4.0 4.2 4.6  CL 93* 93* 90* 88*  CO2 38* 38* 35* 33*  GLUCOSE 133* 115* 124* 124*  BUN 55* 67* 77* 88*  CREATININE 1.62* 2.14* 2.55* 2.68*  CALCIUM 8.9 8.9 8.8* 8.8*   Iron/TIBC/Ferritin/ %Sat    Component Value Date/Time   IRON 47 10/11/2015 0559   TIBC 342 10/11/2015 0559   FERRITIN 71 10/11/2015 0559   IRONPCTSAT 14 10/11/2015 0559    Vitals:   09/11/16 2005 09/12/16 0403 09/12/16 1500 09/12/16 2055  BP: 111/65 (!) 111/96 97/74 97/72   Pulse: (!) 59 67 72 72  Resp: 19 19 18 18   Temp: 98 F (36.7 C) 97.6 F (36.4 C) 98.6 F (37 C) 97.7 F (36.5 C)  TempSrc: Oral Oral Oral Oral  SpO2: 95% 95% 94% 94%  Weight:      Height:       Exam Gen morbidly obese lady, joking around, no distress No rash, cyanosis or gangrene Sclera anicteric, throat clear  No jvd or bruits Chest some crackles R base, L clear RRR no MRG Abd soft ntnd no mass or ascites +bs very obese GU defer MS no joint effusions or deformity Ext trace R pretib edema, no edema L thigh, no thigh edema / no sacral edema / no wounds or ulcers Neuro is  alert, Ox 3 , nf , +myoclonic jerking mild  Na 133 K 4.6  BUN 88  Cr 2.68   Ca 8.8   eGFR 21  Glu 124 WBC 7k Hb 7.7  plt 194 CXR 11/26 - no acute disease    Assessment: 1  Acute on CKD 3/4 - baseline creat 1.6- 2.0.  Creat was 1.6 on admission and progressively rising over last 8 days in hospital.  Has been getting IV lasix then po lasix daily since admission.  Looks dry on exam, no edema, and BP's progressively lower since admission 8 days ago.  Suspect intravasc vol depletion and hypoperfusion from low BP's / BP meds (dilt, coreg for afib).  REcommend stop lasix, give gentle IV NS and decrease dilt and coreg dosing. Let BP come up > 110- 120 range.  Get UA and lytes and [place].  2  Hypoxemia - prob OHS/ OSA, vs less likely edema ( CXR on admit was negative/ clear) 3  Morbid obesity 4  AFib, chrnoic 5  Combined CHF (LV 40-45%, severe RV dysfunction) 6  L ankle fx 7  OSA/ OHS - supposed to be on CPAP  Plan - as above  Kelly Splinter MD [employer] pager [phone]   09/12/2016, 11:53 PM

## 2016-09-12 NOTE — Progress Notes (Signed)
[name], female   DOB: Jun 09, 1956, 60 y.o.   MRN: [id number]  PROGRESS NOTE    Paige Marshall  [id number] DOB: April 26, 1956 DOA: 09/05/2016  PCP: Paige Perna, NP   Brief Narrative:  60 y.o. female with medical history significant forNICM with EF 40-45% in April 2016, CKD stage 3 at baseline, PAF, HTN, morbid obesity, mixed type COPD. At baseline she uses O2 only at night, unable to tolerate CPAP. She was hospitalized 11/18-21 for syncope, right heart failure with NYHA class 3, PAF, morbid obesity, left fibular fracture, and acute on chronic respiratory failure. She was recommended for SNF but she declined placement. She went home on 08/31/16. At home, she didn't feel well, felt congested and weak. Now accept SNF placement. Barrier to discharge: Cr worse this am, lasix dose decreased so will recheck Cr tomorrow am. Creatinine not improve but had acute pulmonary edema with decrease in Lasix dose.  Assessment & Plan:   Principal problem:  Acute on Chronic respiratory failure with hypoxia (HCC),multifactorial and secondary to COPD, OSA, acute on chronic diastolic CHF   - Suspect OSA is a significant contributor of her chronic respiratory failure however she refuses CPAP. - BNP mildly elevated. 2-D echo done 08/30/16, EF 50-55 percent. - Required 40 mg of IV Lasix for acute pulmonary edema. Lasix dose now 60 mg BID instead of 80 mg BID due to rising Cr - Appreciate renal consult and recommendations   Active problems:  Essential hypertension - Continue Coreg, diltiazem, Lasix.  PAF (paroxysmal atrioal fibrillation) ([place]), CHADS 2 score 3-4 - She is not on anticoagulation due to h/o "excessive bleeding" per cardiology notes. - Continue propafenone and diltiazem..  Morbid obesity due to excess calories (HCC) - Body mass index is 53.87 kg/m - Pt counseled on diet   CKD (chronic kidney disease) stage 3 - Serum creatinine continues to rise  despite adjustment of Lasix. - Renal consulted, we appreciate their help   Left fibular fracture - Per Dr. Erlinda Hong from [facility], she is not a surgical candidate due to her chronic respiratory failure. - Patient is unable to weight bear and must be in CAM walker for 4-6 weeks. - She was recommended for SNF placement during last hospitalization, declined before but now accepts placement to SNF  Physical deconditioning due to above comorbidities - PT eval, SNF placement.   Normocytic anemia, anemia of chronic kidney disease - Hemoglobin remains stable at 7.7 - No reports of bleeding     DVT prophylaxis: Lovenox ordered. Code Status: Full Code.  Family Communication: Family not at bedside. Disposition Plan: SNF placement when creatinine stabilizes.   Medical Consultants:  Nephrology 09/12/2016   Procedures:  None  Anti-Infectives:  None    Subjective: No overnight events.   Objective: Vitals:   09/11/16 0421 09/11/16 1500 09/11/16 2005 09/12/16 0403  BP: 107/67 110/67 111/65 (!) 111/96  Pulse: 61 (!) 57 (!) 59 67  Resp: 18 18 19 19   Temp: 98.4 F (36.9 C) 97.4 F (36.3 C) 98 F (36.7 C) 97.6 F (36.4 C)  TempSrc: Oral Oral Oral Oral  SpO2: 97% 93% 95% 95%  Weight:      Height:        Intake/Output Summary (Last 24 hours) at 09/12/16 0711 Last data filed at 09/12/16 0641  Gross per 24 hour  Intake              600 ml  Output  825 ml  Net             -225 ml   Filed Weights   09/06/16 0500 09/08/16 0444  Weight: (!) 157 kg (346 lb 2 oz) (!) 156 kg (343 lb 14.7 oz)    Examination:  General exam: Appears calm and comfortable, no distress   Respiratory system: No wheezing, no rhonchi  Cardiovascular system: S1 & S2 heard, Rate controlled  Gastrointestinal system: Abdomen is obese, non tender  Central nervous system: No focal neurological deficits. Extremities: Pedal edema +1-2 improved since yesterday, palpable pulses; has  boot on left LE Skin: Skin warm, dry  Psychiatry: Mood & affect appropriate.   Data Reviewed: I have personally reviewed following labs and imaging studies  CBC:  Recent Labs Lab 09/05/16 1555 09/06/16 0514 09/09/16 0549 09/10/16 0520 09/11/16 0546 09/12/16 0540  WBC 9.3 8.3 7.6 8.1 8.3 7.9  NEUTROABS 7.6  --   --   --   --   --   HGB 9.8* 8.9* 8.0* 7.8* 7.7* 7.7*  HCT 34.2* 30.6* 26.8* 26.5* 26.1* 26.0*  MCV 90.7 89.2 87.6 88.6 86.1 88.7  PLT 220 221 177 188 216 [id number]   Basic Metabolic Panel:  Recent Labs Lab 09/05/16 1555 09/06/16 0514 09/09/16 0549 09/10/16 0520 09/12/16 0540  NA 144 140 138 136 133*  K 4.1 3.6 4.0 4.2 4.6  CL 96* 93* 93* 90* 88*  CO2 37* 38* 38* 35* 33*  GLUCOSE 96 133* 115* 124* 124*  BUN 61* 55* 67* 77* 88*  CREATININE 1.65* 1.62* 2.14* 2.55* 2.68*  CALCIUM 9.2 8.9 8.9 8.8* 8.8*   GFR: Estimated Creatinine Clearance: 35.9 mL/min (by C-G formula based on SCr of 2.68 mg/dL (H)). Liver Function Tests: No results for input(s): AST, ALT, ALKPHOS, BILITOT, PROT, ALBUMIN in the last 168 hours. No results for input(s): LIPASE, AMYLASE in the last 168 hours. No results for input(s): AMMONIA in the last 168 hours. Coagulation Profile: No results for input(s): INR, PROTIME in the last 168 hours. Cardiac Enzymes: No results for input(s): CKTOTAL, CKMB, CKMBINDEX, TROPONINI in the last 168 hours. BNP (last 3 results) No results for input(s): PROBNP in the last 8760 hours. HbA1C: No results for input(s): HGBA1C in the last 72 hours. CBG: No results for input(s): GLUCAP in the last 168 hours. Lipid Profile: No results for input(s): CHOL, HDL, LDLCALC, TRIG, CHOLHDL, LDLDIRECT in the last 72 hours. Thyroid Function Tests: No results for input(s): TSH, T4TOTAL, FREET4, T3FREE, THYROIDAB in the last 72 hours. Anemia Panel: No results for input(s): VITAMINB12, FOLATE, FERRITIN, TIBC, IRON, RETICCTPCT in the last 72 hours. Urine analysis:    Component  Value Date/Time   COLORURINE YELLOW 04/04/2016 1206   APPEARANCEUR HAZY (A) 04/04/2016 1206   LABSPEC 1.011 04/04/2016 1206   PHURINE 6.5 04/04/2016 1206   GLUCOSEU NEGATIVE 04/04/2016 1206   HGBUR NEGATIVE 04/04/2016 1206   BILIRUBINUR NEGATIVE 04/04/2016 1206   KETONESUR NEGATIVE 04/04/2016 1206   PROTEINUR NEGATIVE 04/04/2016 1206   UROBILINOGEN 1.0 11/16/2009 0554   NITRITE NEGATIVE 04/04/2016 1206   LEUKOCYTESUR NEGATIVE 04/04/2016 1206   Sepsis Labs: @LABRCNTIP (procalcitonin:4,lacticidven:4)   )No results found for this or any previous visit (from the past 240 hour(s)).    Radiology Studies: No results found.      Scheduled Meds: . carvedilol  50 mg Oral BID WC  . diltiazem  180 mg Oral Daily  . docusate sodium  100 mg Oral BID  . enoxaparin (LOVENOX) injection  40 mg Subcutaneous Q24H  . furosemide  60 mg Oral BID  . gabapentin  300 mg Oral QHS  . multivitamin with minerals  1 tablet Oral Daily  . omega-3 acid ethyl esters  1 g Oral BID  . propafenone  150 mg Oral Q8H   Continuous Infusions:   LOS: 6 days    Time spent: 15 minutes  Greater than 50% of the time spent on counseling and coordinating the care.   Leisa Lenz, MD [facility] Pager [phone]  If 7PM-7AM, please contact night-coverage [email or web address] Password [email or web address] 09/12/2016, 7:11 AM

## 2016-09-12 NOTE — Progress Notes (Signed)
Chaplain visit the result of a [place] to assist Ms Schlageter completing a Health Care Power of Attorney and Living Will. Ms Mikolajczyk completed the forms and the forms were witnessed and notarized. A copy of the forms were given to the unit to be scanned into Ms Jost's records.  Sallee Lange. Perlie Stene, DMin Weekend Chaplain

## 2016-09-13 ENCOUNTER — Inpatient Hospital Stay (HOSPITAL_COMMUNITY): Payer: Medicare Other

## 2016-09-13 LAB — BASIC METABOLIC PANEL
Anion gap: 10 (ref 5–15)
BUN: 82 mg/dL — AB (ref 6–20)
CO2: 34 mmol/L — ABNORMAL HIGH (ref 22–32)
CREATININE: 2.27 mg/dL — AB (ref 0.44–1.00)
Calcium: 8.7 mg/dL — ABNORMAL LOW (ref 8.9–10.3)
Chloride: 91 mmol/L — ABNORMAL LOW (ref 101–111)
GFR calc Af Amer: 26 mL/min — ABNORMAL LOW (ref >60)
GFR, EST NON AFRICAN AMERICAN: 23 mL/min — AB (ref >60)
Glucose, Bld: 96 mg/dL (ref 65–99)
POTASSIUM: 4.4 mmol/L (ref 3.5–5.1)
SODIUM: 135 mmol/L (ref 135–145)

## 2016-09-13 LAB — URINALYSIS, ROUTINE W REFLEX MICROSCOPIC
Bilirubin Urine: NEGATIVE
Glucose, UA: NEGATIVE mg/dL
Hgb urine dipstick: NEGATIVE
KETONES UR: NEGATIVE mg/dL
LEUKOCYTES UA: NEGATIVE
NITRITE: NEGATIVE
PH: 5 (ref 5.0–8.0)
PROTEIN: NEGATIVE mg/dL
Specific Gravity, Urine: 1.017 (ref 1.005–1.030)

## 2016-09-13 LAB — CBC
HEMATOCRIT: 27.2 % — AB (ref 36.0–46.0)
Hemoglobin: 8 g/dL — ABNORMAL LOW (ref 12.0–15.0)
MCH: 26.4 pg (ref 26.0–34.0)
MCHC: 29.4 g/dL — ABNORMAL LOW (ref 30.0–36.0)
MCV: 89.8 fL (ref 78.0–100.0)
Platelets: 207 10*3/uL (ref 150–400)
RBC: 3.03 MIL/uL — AB (ref 3.87–5.11)
RDW: 19.6 % — AB (ref 11.5–15.5)
WBC: 7 10*3/uL (ref 4.0–10.5)

## 2016-09-13 LAB — SODIUM, URINE, RANDOM: Sodium, Ur: <10 mmol/L

## 2016-09-13 LAB — CREATININE, URINE, RANDOM: CREATININE, URINE: 116.62 mg/dL

## 2016-09-13 MED ORDER — DARBEPOETIN ALFA 100 MCG/0.5ML IJ SOSY
100.0000 ug | PREFILLED_SYRINGE | INTRAMUSCULAR | Status: DC
  Administered 2016-09-13: 100 ug via SUBCUTANEOUS
  Filled 2016-09-13: qty 0.5

## 2016-09-13 MED ORDER — SODIUM CHLORIDE 0.9 % IV SOLN
INTRAVENOUS | Status: DC
  Administered 2016-09-13: 02:00:00 via INTRAVENOUS

## 2016-09-13 MED ORDER — DILTIAZEM HCL 30 MG PO TABS
30.0000 mg | ORAL_TABLET | Freq: Four times a day (QID) | ORAL | Status: DC
  Administered 2016-09-13 – 2016-09-14 (×8): 30 mg via ORAL
  Filled 2016-09-13 (×9): qty 1

## 2016-09-13 MED ORDER — CARVEDILOL 12.5 MG PO TABS
12.5000 mg | ORAL_TABLET | Freq: Two times a day (BID) | ORAL | Status: DC
  Administered 2016-09-13 – 2016-09-14 (×4): 12.5 mg via ORAL
  Filled 2016-09-13 (×4): qty 1

## 2016-09-13 NOTE — Progress Notes (Signed)
[name], female   DOB: 05-Aug-1956, 60 y.o.   MRN: [id number]  PROGRESS NOTE    Paige Marshall  [id number] DOB: Mar 02, 1956 DOA: 09/05/2016  PCP: Kerin Perna, NP   Brief Narrative:  60 y.o. female with medical history significant forNICM with EF 40-45% in April 2016, CKD stage 3 at baseline, PAF, HTN, morbid obesity, mixed type COPD. At baseline she uses O2 only at night, unable to tolerate CPAP. She was hospitalized 11/18-21 for syncope, right heart failure with NYHA class 3, PAF, morbid obesity, left fibular fracture, and acute on chronic respiratory failure. She was recommended for SNF but she declined placement. She went home on 08/31/16. At home, she didn't feel well, felt congested and weak. Now accept SNF placement. Barrier to discharge: Cr worse this am, lasix dose decreased so will recheck Cr tomorrow am. Creatinine not improve but had acute pulmonary edema with decrease in Lasix dose.  Assessment & Plan:   Principal problem:  Acute on Chronic respiratory failure with hypoxia (HCC),multifactorial and secondary to COPD, OSA, acute on chronic diastolic CHF   - Suspect OSA is a significant contributor of her chronic respiratory failure however she refuses CPAP. - BNP mildly elevated. 2-D echo done 08/30/16, EF 50-55 percent. - Required 40 mg of IV Lasix for acute pulmonary edema. Lasix dose was decreased to 60 mg BID instead of 80 mg BID due to rising Cr. Lasix have been discontinued per nephrology recommendations at this time. Nephrology to advice when Lasix may be resumed and at what dose. - Appreciate renal consult and recommendations   Active problems:  Essential hypertension - Continue Coreg, diltiazem. Lasix on hold secondary to acute on chronic kidney disease.  PAF (paroxysmal atrioal fibrillation) ([place]), CHADS 2 score 3-4 - She is not on anticoagulation due to h/o "excessive bleeding" per cardiology notes. - Continue propafenone  and diltiazem..  Morbid obesity due to excess calories (HCC) - Body mass index is 53.87 kg/m - Pt counseled on diet   Acute on CKD (chronic kidney disease) stage 3/4 - Serum creatinine continues to rise despite adjustment of Lasix. - Renal consulted, and feels very likely secondary to intravascular volume depletion and hypoperfusion from low blood pressures. Lasix has been discontinued and patient placed on gentle hydration. Diltiazem and Coreg doses have been decreased to let blood pressure, 110-120 range. Renal function improving and creatinine trending back down. Renal ultrasound unremarkable. Nephrology following and appreciate input and recommendations.  Left fibular fracture - Per Dr. Erlinda Hong from [facility], she is not a surgical candidate due to her chronic respiratory failure. - Patient is unable to weight bear and must be in CAM walker for 4-6 weeks. - She was recommended for SNF placement during last hospitalization, declined before but now accepts placement to SNF  Physical deconditioning due to above comorbidities - PT eval, SNF placement.   Normocytic anemia, anemia of chronic kidney disease - Hemoglobin remains stable at 8 - No reports of bleeding     DVT prophylaxis: Lovenox ordered. Code Status: Full Code.  Family Communication: Family not at bedside. Disposition Plan: SNF placement when creatinine stabilizes.   Medical Consultants:  Nephrology 09/12/2016   Procedures:  Chest x-ray 09/05/2016  Anti-Infectives:  None    Subjective: Patient denies any chest pain. Shortness of breath. Patient concerned about her Lasix being held. Patient states good urine output.  Objective: Vitals:   09/13/16 0022 09/13/16 0104 09/13/16 0442 09/13/16 1258  BP:   122/72 [name])  150/81  Pulse:   61 74  Resp:   18 18  Temp:   97.4 F (36.3 C) 97.8 F (36.6 C)  TempSrc:   Oral Oral  SpO2:   93% 93%  Weight: (!) 160.6 kg (354 lb) (!) 160.6 kg (354 lb)      Height:        Intake/Output Summary (Last 24 hours) at 09/13/16 1323 Last data filed at 09/13/16 0300  Gross per 24 hour  Intake           482.17 ml  Output              300 ml  Net           182.17 ml   Filed Weights   09/13/16 0000 09/13/16 0022 09/13/16 0104  Weight: (!) 160.6 kg (354 lb) (!) 160.6 kg (354 lb) (!) 160.6 kg (354 lb)    Examination:  General exam: Appears calm and comfortable, no distress   Respiratory system: No wheezing, no rhonchi  Cardiovascular system: S1 & S2 heard, Rate controlled  Gastrointestinal system: Abdomen is obese, non tender  Central nervous system: No focal neurological deficits. Extremities: Pedal edema +1, palpable pulses; has boot on left LE Skin: Skin warm, dry  Psychiatry: Mood & affect appropriate.   Data Reviewed: I have personally reviewed following labs and imaging studies  CBC:  Recent Labs Lab 09/09/16 0549 09/10/16 0520 09/11/16 0546 09/12/16 0540 09/13/16 0512  WBC 7.6 8.1 8.3 7.9 7.0  HGB 8.0* 7.8* 7.7* 7.7* 8.0*  HCT 26.8* 26.5* 26.1* 26.0* 27.2*  MCV 87.6 88.6 86.1 88.7 89.8  PLT 177 188 216 194 [id number]   Basic Metabolic Panel:  Recent Labs Lab 09/09/16 0549 09/10/16 0520 09/12/16 0540 09/13/16 0512  NA 138 136 133* 135  K 4.0 4.2 4.6 4.4  CL 93* 90* 88* 91*  CO2 38* 35* 33* 34*  GLUCOSE 115* 124* 124* 96  BUN 67* 77* 88* 82*  CREATININE 2.14* 2.55* 2.68* 2.27*  CALCIUM 8.9 8.8* 8.8* 8.7*   GFR: Estimated Creatinine Clearance: 43.2 mL/min (by C-G formula based on SCr of 2.27 mg/dL (H)). Liver Function Tests: No results for input(s): AST, ALT, ALKPHOS, BILITOT, PROT, ALBUMIN in the last 168 hours. No results for input(s): LIPASE, AMYLASE in the last 168 hours. No results for input(s): AMMONIA in the last 168 hours. Coagulation Profile: No results for input(s): INR, PROTIME in the last 168 hours. Cardiac Enzymes: No results for input(s): CKTOTAL, CKMB, CKMBINDEX, TROPONINI in the last 168 hours. BNP  (last 3 results) No results for input(s): PROBNP in the last 8760 hours. HbA1C: No results for input(s): HGBA1C in the last 72 hours. CBG: No results for input(s): GLUCAP in the last 168 hours. Lipid Profile: No results for input(s): CHOL, HDL, LDLCALC, TRIG, CHOLHDL, LDLDIRECT in the last 72 hours. Thyroid Function Tests: No results for input(s): TSH, T4TOTAL, FREET4, T3FREE, THYROIDAB in the last 72 hours. Anemia Panel: No results for input(s): VITAMINB12, FOLATE, FERRITIN, TIBC, IRON, RETICCTPCT in the last 72 hours. Urine analysis:    Component Value Date/Time   COLORURINE YELLOW 09/13/2016 0225   APPEARANCEUR CLEAR 09/13/2016 0225   LABSPEC 1.017 09/13/2016 0225   PHURINE 5.0 09/13/2016 0225   GLUCOSEU NEGATIVE 09/13/2016 0225   HGBUR NEGATIVE 09/13/2016 0225   BILIRUBINUR NEGATIVE 09/13/2016 0225   KETONESUR NEGATIVE 09/13/2016 0225   PROTEINUR NEGATIVE 09/13/2016 0225   UROBILINOGEN 1.0 11/16/2009 0554   NITRITE NEGATIVE 09/13/2016 0225  LEUKOCYTESUR NEGATIVE 09/13/2016 0225   Sepsis Labs: @LABRCNTIP (procalcitonin:4,lacticidven:4)   )No results found for this or any previous visit (from the past 240 hour(s)).    Radiology Studies: US Renal  Result Date: 09/13/2016 CLINICAL DATA:  Acute kidney injury EXAM: RENAL / URINARY TRACT ULTRASOUND COMPLETE COMPARISON:  03/08/2016 FINDINGS: Right Kidney: Length: 12.8 cm. Echogenicity within normal limits. No mass or hydronephrosis visualized. Left Kidney: Length: 11.6 cm. Echogenicity within normal limits. No mass or hydronephrosis visualized. Bladder: Limited visualization. Appears normal for degree of bladder distention. IMPRESSION: Negative renal ultrasound. Electronically Signed   By: Monte Fantasia M.D.   On: 09/13/2016 10:25        Scheduled Meds: . carvedilol  12.5 mg Oral BID WC  . diltiazem  30 mg Oral Q6H  . docusate sodium  100 mg Oral BID  . enoxaparin (LOVENOX) injection  40 mg Subcutaneous Q24H  .  gabapentin  300 mg Oral QHS  . multivitamin with minerals  1 tablet Oral Daily  . omega-3 acid ethyl esters  1 g Oral BID  . propafenone  150 mg Oral Q8H   Continuous Infusions: . sodium chloride 65 mL/hr at 09/13/16 0150     LOS: 7 days    Time spent: 30 minutes  Greater than 50% of the time spent on counseling and coordinating the care.   Irine Seal, MD [facility] Pager [phone]  If 7PM-7AM, please contact night-coverage [email or web address] Password [email or web address] 09/13/2016, 1:23 PM

## 2016-09-13 NOTE — Progress Notes (Signed)
Physical Therapy Treatment Patient Details Name: Paige Marshall MRN: [id number] DOB: July 27, 1956 Today's Date: 09/13/2016    History of Present Illness Paige Marshall is a 60 y.o. 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 Pt DCed home and is now readmitted with acute on chronic respiratory failure.     PT Comments    Pt excited to get OOB.  Very motivated but limited due to NWB status and BMI.  side to side rolling pt able at MinGuard/Min Assist using bed rails and momentum.  Assisted OOB via MaxiMove.  Unable to attempt sit to stand transfers at NWB L LE and pt's BMI.  Unable to attempt lateral scooting due to air bed and no drop arm recliner.  Once in recliner pt was able to performed self posterior scoot to back of recliner and static sit B LE down. Mild c/o L LE pain with mvt.  Pt progressing with her limited mobility but will need to D/C to SNF for further Physical Therapy.  Follow Up Recommendations  SNF;Supervision/Assistance - 24 hour     Equipment Recommendations       Recommendations for Other Services       Precautions / Restrictions Precautions Precautions: Fall Required Braces or Orthoses: Other Brace/Splint Other Brace/Splint: CAM boot Restrictions Weight Bearing Restrictions: Yes LLE Weight Bearing: Non weight bearing Other Position/Activity Restrictions: NWB on LLE    Mobility  Bed Mobility Overal bed mobility: Needs Assistance Bed Mobility: Rolling Rolling: +2 for safety/equipment;Min guard;Min assist         General bed mobility comments: assist with L LE side to side rolling to place [employer]  under pt  Transfers Overall transfer level: Needs assistance                  Ambulation/Gait                 Stairs            Wheelchair Mobility    Modified Rankin (Stroke Patients Only)       Balance                                    Cognition Arousal/Alertness: Awake/alert Behavior During Therapy: WFL for tasks assessed/performed Overall Cognitive Status: Within Functional Limits for tasks assessed                      Exercises      General Comments        Pertinent Vitals/Pain Pain Assessment: Faces Faces Pain Scale: Hurts a little bit Pain Location: L leg with mvt Pain Descriptors / Indicators: Grimacing Pain Intervention(s): Monitored during session;Repositioned    Home Living                      Prior Function            PT Goals (current goals can now be found in the care plan section) Progress towards PT goals: Progressing toward goals    Frequency    Min 5X/week      PT Plan Current plan remains appropriate  Co-evaluation             End of Session Equipment Utilized During Treatment: Oxygen Activity Tolerance: Patient tolerated treatment well Patient left: in chair;with call bell/phone within reach;with family/visitor present;with chair alarm set     Time: 1425-1440 PT Time Calculation (min) (ACUTE ONLY): 15 min  Charges:  $Therapeutic Activity: 8-22 mins                    G Codes:      Paige Marshall  PTA [facility]  Acute  Rehab Pager      [phone]

## 2016-09-13 NOTE — Progress Notes (Signed)
Subjective:  Only 650 uop recorded but one shift left off- BUN and creatinine both down slightly BP is higher Objective Vital signs in last 24 hours: Vitals:   09/13/16 0022 09/13/16 0104 09/13/16 0442 09/13/16 1258  BP:   122/72 (!) 150/81  Pulse:   61 74  Resp:   18 18  Temp:   97.4 F (36.3 C) 97.8 F (36.6 C)  TempSrc:   Oral Oral  SpO2:   93% 93%  Weight: (!) 160.6 kg (354 lb) (!) 160.6 kg (354 lb)    Height:       Weight change:   Intake/Output Summary (Last 24 hours) at 09/13/16 1344 Last data filed at 09/13/16 0300  Gross per 24 hour  Intake           282.17 ml  Output              200 ml  Net            82.17 ml   Assessment: 1  Acute on CKD 3/4 - baseline creat 1.6- 2.0.  Creat was 1.6 on admission and progressively rising over last 8 days in hospital.  Has been getting IV lasix then po lasix daily since admission.  Looked dry on exam on 12/3, no edema, and BP's progressively lower since admission.  Suspect intravasc vol depletion and hypoperfusion from low BP's / BP meds (dilt, coreg for afib).   lasix stopped, given gentle IV NS (will now stop) and decreased dilt and coreg dosing. Let BP come up > 110- 120 range.  Urine negative for blood and protein so not suspicious for an intrarenal process.  "i know that I need lasix" will resume tomorrow  2  Hypoxemia - prob OHS/ OSA, vs less likely edema ( CXR on admit was negative/ clear) 3  Morbid obesity- complicating 4  AFib, chrnoic 5  Combined CHF (LV 40-45%, severe RV dysfunction) 6  L ankle fx 7  OSA/ OHS - supposed to be on CPAP 8. Anemia- also may not be helping- will add darbe and check iron stores    [place] [facility]    Labs: Basic Metabolic Panel:  Recent Labs Lab 09/10/16 0520 09/12/16 0540 09/13/16 0512  NA 136 133* 135  K 4.2 4.6 4.4  CL 90* 88* 91*  CO2 35* 33* 34*  GLUCOSE 124* 124* 96  BUN 77* 88* 82*  CREATININE 2.55* 2.68* 2.27*  CALCIUM 8.8* 8.8* 8.7*   Liver Function Tests: No  results for input(s): AST, ALT, ALKPHOS, BILITOT, PROT, ALBUMIN in the last 168 hours. No results for input(s): LIPASE, AMYLASE in the last 168 hours. No results for input(s): AMMONIA in the last 168 hours. CBC:  Recent Labs Lab 09/09/16 0549 09/10/16 0520 09/11/16 0546 09/12/16 0540 09/13/16 0512  WBC 7.6 8.1 8.3 7.9 7.0  HGB 8.0* 7.8* 7.7* 7.7* 8.0*  HCT 26.8* 26.5* 26.1* 26.0* 27.2*  MCV 87.6 88.6 86.1 88.7 89.8  PLT 177 188 216 194 207   Cardiac Enzymes: No results for input(s): CKTOTAL, CKMB, CKMBINDEX, TROPONINI in the last 168 hours. CBG: No results for input(s): GLUCAP in the last 168 hours.  Iron Studies: No results for input(s): IRON, TIBC, TRANSFERRIN, FERRITIN in the last 72 hours. Studies/Results: US Renal  Result Date: 09/13/2016 CLINICAL DATA:  Acute kidney injury EXAM: RENAL / URINARY TRACT ULTRASOUND COMPLETE COMPARISON:  03/08/2016 FINDINGS: Right Kidney: Length: 12.8 cm. Echogenicity within normal limits. No mass or hydronephrosis visualized. Left Kidney: Length: 11.6  cm. Echogenicity within normal limits. No mass or hydronephrosis visualized. Bladder: Limited visualization. Appears normal for degree of bladder distention. IMPRESSION: Negative renal ultrasound. Electronically Signed   By: Monte Fantasia M.D.   On: 09/13/2016 10:25   Medications: Infusions: . sodium chloride 65 mL/hr at 09/13/16 0150    Scheduled Medications: . carvedilol  12.5 mg Oral BID WC  . diltiazem  30 mg Oral Q6H  . docusate sodium  100 mg Oral BID  . enoxaparin (LOVENOX) injection  40 mg Subcutaneous Q24H  . gabapentin  300 mg Oral QHS  . multivitamin with minerals  1 tablet Oral Daily  . omega-3 acid ethyl esters  1 g Oral BID  . propafenone  150 mg Oral Q8H    have reviewed scheduled and prn medications.  Physical Exam: General: alert, NAD Heart: RRR Lungs: difficult exam Abdomen: obese, soft Extremities: obese- difficult to tell pitting    09/13/2016,1:44 PM  LOS:  7 days

## 2016-09-14 ENCOUNTER — Telehealth: Payer: Self-pay | Admitting: Cardiovascular Disease

## 2016-09-14 DIAGNOSIS — R079 Chest pain, unspecified: Secondary | ICD-10-CM | POA: Diagnosis not present

## 2016-09-14 DIAGNOSIS — M6281 Muscle weakness (generalized): Secondary | ICD-10-CM | POA: Diagnosis not present

## 2016-09-14 DIAGNOSIS — G253 Myoclonus: Secondary | ICD-10-CM | POA: Diagnosis present

## 2016-09-14 DIAGNOSIS — R571 Hypovolemic shock: Secondary | ICD-10-CM | POA: Diagnosis not present

## 2016-09-14 DIAGNOSIS — E119 Type 2 diabetes mellitus without complications: Secondary | ICD-10-CM | POA: Diagnosis not present

## 2016-09-14 DIAGNOSIS — R7989 Other specified abnormal findings of blood chemistry: Secondary | ICD-10-CM | POA: Diagnosis not present

## 2016-09-14 DIAGNOSIS — I951 Orthostatic hypotension: Secondary | ICD-10-CM | POA: Diagnosis not present

## 2016-09-14 DIAGNOSIS — W19XXXD Unspecified fall, subsequent encounter: Secondary | ICD-10-CM | POA: Diagnosis present

## 2016-09-14 DIAGNOSIS — N183 Chronic kidney disease, stage 3 (moderate): Secondary | ICD-10-CM | POA: Diagnosis present

## 2016-09-14 DIAGNOSIS — N170 Acute kidney failure with tubular necrosis: Secondary | ICD-10-CM | POA: Diagnosis not present

## 2016-09-14 DIAGNOSIS — J9621 Acute and chronic respiratory failure with hypoxia: Secondary | ICD-10-CM | POA: Diagnosis not present

## 2016-09-14 DIAGNOSIS — R609 Edema, unspecified: Secondary | ICD-10-CM | POA: Diagnosis not present

## 2016-09-14 DIAGNOSIS — I5043 Acute on chronic combined systolic (congestive) and diastolic (congestive) heart failure: Secondary | ICD-10-CM | POA: Diagnosis not present

## 2016-09-14 DIAGNOSIS — G4733 Obstructive sleep apnea (adult) (pediatric): Secondary | ICD-10-CM | POA: Diagnosis not present

## 2016-09-14 DIAGNOSIS — M84364D Stress fracture, left fibula, subsequent encounter for fracture with routine healing: Secondary | ICD-10-CM | POA: Diagnosis not present

## 2016-09-14 DIAGNOSIS — I509 Heart failure, unspecified: Secondary | ICD-10-CM | POA: Diagnosis not present

## 2016-09-14 DIAGNOSIS — I130 Hypertensive heart and chronic kidney disease with heart failure and stage 1 through stage 4 chronic kidney disease, or unspecified chronic kidney disease: Secondary | ICD-10-CM | POA: Diagnosis not present

## 2016-09-14 DIAGNOSIS — N320 Bladder-neck obstruction: Secondary | ICD-10-CM | POA: Diagnosis present

## 2016-09-14 DIAGNOSIS — M069 Rheumatoid arthritis, unspecified: Secondary | ICD-10-CM | POA: Diagnosis present

## 2016-09-14 DIAGNOSIS — Z6841 Body Mass Index (BMI) 40.0 and over, adult: Secondary | ICD-10-CM | POA: Diagnosis not present

## 2016-09-14 DIAGNOSIS — E662 Morbid (severe) obesity with alveolar hypoventilation: Secondary | ICD-10-CM | POA: Diagnosis present

## 2016-09-14 DIAGNOSIS — I5033 Acute on chronic diastolic (congestive) heart failure: Secondary | ICD-10-CM | POA: Diagnosis not present

## 2016-09-14 DIAGNOSIS — R0602 Shortness of breath: Secondary | ICD-10-CM | POA: Diagnosis not present

## 2016-09-14 DIAGNOSIS — I2729 Other secondary pulmonary hypertension: Secondary | ICD-10-CM | POA: Diagnosis present

## 2016-09-14 DIAGNOSIS — J9601 Acute respiratory failure with hypoxia: Secondary | ICD-10-CM | POA: Diagnosis not present

## 2016-09-14 DIAGNOSIS — N179 Acute kidney failure, unspecified: Secondary | ICD-10-CM | POA: Diagnosis not present

## 2016-09-14 DIAGNOSIS — D631 Anemia in chronic kidney disease: Secondary | ICD-10-CM | POA: Diagnosis present

## 2016-09-14 DIAGNOSIS — I10 Essential (primary) hypertension: Secondary | ICD-10-CM | POA: Diagnosis not present

## 2016-09-14 DIAGNOSIS — K8020 Calculus of gallbladder without cholecystitis without obstruction: Secondary | ICD-10-CM | POA: Diagnosis not present

## 2016-09-14 DIAGNOSIS — D649 Anemia, unspecified: Secondary | ICD-10-CM | POA: Diagnosis not present

## 2016-09-14 DIAGNOSIS — I959 Hypotension, unspecified: Secondary | ICD-10-CM | POA: Diagnosis not present

## 2016-09-14 DIAGNOSIS — K7200 Acute and subacute hepatic failure without coma: Secondary | ICD-10-CM | POA: Diagnosis present

## 2016-09-14 DIAGNOSIS — J9600 Acute respiratory failure, unspecified whether with hypoxia or hypercapnia: Secondary | ICD-10-CM | POA: Diagnosis not present

## 2016-09-14 DIAGNOSIS — I480 Paroxysmal atrial fibrillation: Secondary | ICD-10-CM | POA: Diagnosis present

## 2016-09-14 DIAGNOSIS — M170 Bilateral primary osteoarthritis of knee: Secondary | ICD-10-CM | POA: Diagnosis present

## 2016-09-14 DIAGNOSIS — Z9981 Dependence on supplemental oxygen: Secondary | ICD-10-CM | POA: Diagnosis not present

## 2016-09-14 DIAGNOSIS — E875 Hyperkalemia: Secondary | ICD-10-CM | POA: Diagnosis present

## 2016-09-14 DIAGNOSIS — R0902 Hypoxemia: Secondary | ICD-10-CM | POA: Diagnosis not present

## 2016-09-14 DIAGNOSIS — R570 Cardiogenic shock: Secondary | ICD-10-CM | POA: Diagnosis not present

## 2016-09-14 DIAGNOSIS — R001 Bradycardia, unspecified: Secondary | ICD-10-CM | POA: Diagnosis not present

## 2016-09-14 DIAGNOSIS — S82402D Unspecified fracture of shaft of left fibula, subsequent encounter for closed fracture with routine healing: Secondary | ICD-10-CM | POA: Diagnosis not present

## 2016-09-14 DIAGNOSIS — J9622 Acute and chronic respiratory failure with hypercapnia: Secondary | ICD-10-CM | POA: Diagnosis present

## 2016-09-14 DIAGNOSIS — Z9119 Patient's noncompliance with other medical treatment and regimen: Secondary | ICD-10-CM | POA: Diagnosis not present

## 2016-09-14 DIAGNOSIS — J449 Chronic obstructive pulmonary disease, unspecified: Secondary | ICD-10-CM | POA: Diagnosis present

## 2016-09-14 DIAGNOSIS — E7800 Pure hypercholesterolemia, unspecified: Secondary | ICD-10-CM | POA: Diagnosis present

## 2016-09-14 LAB — IRON AND TIBC
IRON: 40 ug/dL (ref 28–170)
Saturation Ratios: 12 % (ref 10.4–31.8)
TIBC: 339 ug/dL (ref 250–450)
UIBC: 299 ug/dL

## 2016-09-14 LAB — RENAL FUNCTION PANEL
ANION GAP: 10 (ref 5–15)
Albumin: 3.5 g/dL (ref 3.5–5.0)
BUN: 85 mg/dL — ABNORMAL HIGH (ref 6–20)
CHLORIDE: 92 mmol/L — AB (ref 101–111)
CO2: 33 mmol/L — ABNORMAL HIGH (ref 22–32)
Calcium: 9.1 mg/dL (ref 8.9–10.3)
Creatinine, Ser: 2.14 mg/dL — ABNORMAL HIGH (ref 0.44–1.00)
GFR, EST AFRICAN AMERICAN: 28 mL/min — AB (ref >60)
GFR, EST NON AFRICAN AMERICAN: 24 mL/min — AB (ref >60)
Glucose, Bld: 106 mg/dL — ABNORMAL HIGH (ref 65–99)
PHOSPHORUS: 5.2 mg/dL — AB (ref 2.5–4.6)
POTASSIUM: 4.5 mmol/L (ref 3.5–5.1)
Sodium: 135 mmol/L (ref 135–145)

## 2016-09-14 LAB — CBC
HCT: 26.6 % — ABNORMAL LOW (ref 36.0–46.0)
Hemoglobin: 7.7 g/dL — ABNORMAL LOW (ref 12.0–15.0)
MCH: 25.9 pg — ABNORMAL LOW (ref 26.0–34.0)
MCHC: 28.9 g/dL — AB (ref 30.0–36.0)
MCV: 89.6 fL (ref 78.0–100.0)
PLATELETS: 240 10*3/uL (ref 150–400)
RBC: 2.97 MIL/uL — ABNORMAL LOW (ref 3.87–5.11)
RDW: 19.6 % — AB (ref 11.5–15.5)
WBC: 7.4 10*3/uL (ref 4.0–10.5)

## 2016-09-14 MED ORDER — OXYCODONE HCL 5 MG PO TABS: 5.0000 mg | ORAL_TABLET | Freq: Four times a day (QID) | ORAL | 0 refills | Status: DC | PRN

## 2016-09-14 MED ORDER — ENOXAPARIN SODIUM 40 MG/0.4ML ~~LOC~~ SOLN: 40.0000 mg | SUBCUTANEOUS | Status: DC

## 2016-09-14 MED ORDER — POLYETHYLENE GLYCOL 3350 17 G PO PACK: 17.0000 g | PACK | Freq: Two times a day (BID) | ORAL | 0 refills | Status: DC

## 2016-09-14 MED ORDER — SODIUM CHLORIDE 0.9 % IV SOLN
125.0000 mg | Freq: Every day | INTRAVENOUS | Status: DC
  Administered 2016-09-14: 125 mg via INTRAVENOUS
  Filled 2016-09-14: qty 10

## 2016-09-14 MED ORDER — FUROSEMIDE 40 MG PO TABS
80.0000 mg | ORAL_TABLET | Freq: Two times a day (BID) | ORAL | Status: DC
  Administered 2016-09-14: 80 mg via ORAL
  Filled 2016-09-14: qty 2

## 2016-09-14 MED ORDER — DOCUSATE SODIUM 100 MG PO CAPS: 100.0000 mg | ORAL_CAPSULE | Freq: Two times a day (BID) | ORAL | 0 refills | Status: DC

## 2016-09-14 NOTE — Telephone Encounter (Signed)
Closed encounter °

## 2016-09-14 NOTE — Progress Notes (Signed)
Gave report to Janett Billow, Therapist, sports at [facility]. Left number in case she had additional questions.

## 2016-09-14 NOTE — Clinical Social Work Placement (Addendum)
   CLINICAL SOCIAL WORK PLACEMENT  NOTE  Date:  09/14/2016  Patient Details  Name: Paige Marshall MRN: [id number] Date of Birth: 11-Aug-1956  Clinical Social Work is seeking post-discharge placement for this patient at the Gantt level of care (*CSW will initial, date and re-position this form in  chart as items are completed):  Yes   Patient/family provided with [facility] Work Department's list of facilities offering this level of care within the geographic area requested by the patient (or if unable, by the patient's family).  Yes   Patient/family informed of their freedom to choose among providers that offer the needed level of care, that participate in Medicare, Medicaid or managed care program needed by the patient, have an available bed and are willing to accept the patient.  Yes   Patient/family informed of [facility]'s ownership interest in [facility] and [facility], as well as of the fact that they are under no obligation to receive care at these facilities.  PASRR submitted to EDS on       PASRR number received on       Existing PASRR number confirmed on 09/14/16     FL2 transmitted to all facilities in geographic area requested by pt/family on       FL2 transmitted to all facilities within larger geographic area on       Patient informed that his/her managed care company has contracts with or will negotiate with certain facilities, including the following:  [facility]     Yes   Patient/family informed of bed offers received.  Patient chooses bed at [facility]     Physician recommends and patient chooses bed at [facility]    Patient to be transferred to [facility] on 09/14/16.  Patient to be transferred to facility by [facility]     Patient family notified on 09/14/16 of transfer.  Name of family member notified:    Patient will notify significant other and children.     PHYSICIAN Please prepare prescriptions     Additional Comment:    _______________________________________________ Lia Hopping, LCSW 09/14/2016, 1:36 PM

## 2016-09-14 NOTE — Discharge Summary (Addendum)
Physician Discharge Summary  Paige Marshall [id number] DOB: Mar 01, 1956 DOA: 09/05/2016  PCP: Kerin Perna, NP  Admit date: 09/05/2016 Discharge date: 09/14/2016  Time spent: 65 minutes  Recommendations for Outpatient Follow-up:  1. Follow up with MD at SNF. Patient will need BMET in 1-2 weeks to follow up on renal function and electrolytes. 2. Home oxygen  2LNC 3. Follow up with Dr Erlinda Hong in 3-4 weeks.   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  Diet recommendation: heart healthy  Filed Weights   09/13/16 0000 09/13/16 0022 09/13/16 0104  Weight: (!) 160.6 kg (354 lb) (!) 160.6 kg (354 lb) (!) 160.6 kg (354 lb)    History of present illness:  Per Dr Paige Marshall is a 60 y.o. female with medical history significant of NICM with EF 40-45% in April 2016, CKD stage 3 at baseline, PAF, HTN, morbid obesity, mixed type COPD. At baseline she uses O2 only at night, unable to tolerate CPAP.  She was admitted from 11/18-21 for syncope, right heart failure with NYHA class 3, PAF, morbid obesity, left fibular fracture, and acute on chronic respiratory failure.  She was recommended for SNF but she declined placement. Fine when she went home on 11/21.  She was discharged, went home, and got sick. She reported that she had been "going downhill" since she left.  Coughing, congested.  Drinking orange juice,  2% milk, water.  Walks with a walker because she needs knee replacement - but body is too weak for this thus far.  Had been working on losing weight so she can get her knees done - used to do water aerobics and walk daily and wanted to get back to this.  Drinking protein shakes, but started having abdominal pain and so stopped drinking them.    Today, ate a spoonful of eggs and a piece of bacon and got to feeling bad.  Had been feeling bad the last couple of days but she didn't want to tell anyone.  She had a family meeting and decided it wasn't fair to son and boyfriend for them to have to help her do everything.  Now wants to go to rehab to stay until her leg is fixed.    +SOB.  +cough, productive of think yellow sputum, improved with nebulizer treatment.  Needs medication for leg pain, left her cream at home.  No fevers.  No N/V/D, +constipation.  Slight chest pain.   ED Course: Per PA-C Browning: Patient with SOB and chest tightness. Worsening since being discharged from the hospital 2 days ago.  PE was considered during recent hospitalization, but ultimately thought to be less likely given chronic changes seen on echocardiogram. Unfortunately the patient cannot get a CT PE study, and is too claustrophobic to get a V/Q.  Laboratory workup was fairly reassuring, chest x-ray showed no fluid overload, BNP was mildly elevated when compared to  her discharge BNP. Patient's weight is up about 11 pounds since discharge. Patient seen by and discussed with Dr. Ralene Bathe, who states would really like to be admitted to skilled nursing facility, which was advised to her during her recent hospitalization, however the patient declined at that time.   Hospital Course:  Acute on Chronic respiratory failure with hypoxia (HCC),multifactorial and secondary to COPD, OSA, acute on chronic diastolic CHF   - Suspect OSA is a significant contributor of her chronic respiratory failure however she refused CPAP. - BNP mildly  elevated. 2-D echo done 08/30/16, EF 50-55 percent. - Required 40 mg of IV Lasix for acute pulmonary edema. Lasix dose was decreased to 60 mg BID instead of 80 mg BID due to rising Cr. Lasix was discontinued per nephrology recommendations at this time. Patient improved clinically. Has renal function improved patient was started back on a home regimen of oral Lasix on day of discharge. Outpatient follow-up.   Active problems:  Essential hypertension - Continued on Coreg, diltiazem. Lasix was held secondary to acute on chronic kidney disease. As renal function improved patient's Lasix was resumed at home oral dose.  PAF (paroxysmal atrioal fibrillation) ([place]), CHADS 2 score 3-4 - She is not on anticoagulation due to h/o "excessive bleeding" per cardiology notes. - Continued on propafenone and diltiazem..  Morbid obesity due to excess calories (HCC) - Body mass index is 53.87 kg/m - Pt counseled on diet   Acute on CKD (chronic kidney disease) stage 3/4 - Serum creatinine continued to rise despite adjustment of Lasix during the hospitalization. - Renal consulted, and feels very likely secondary to intravascular volume depletion and hypoperfusion from low blood pressures. Lasix was discontinued and patient placed on gentle hydration. Diltiazem and Coreg doses were decreased to let blood pressure, 110-120 range. Renal function improved and creatinine trended back down. Renal ultrasound unremarkable. Nephrology followed and as renal function improved patient resumed back on home regimen of lasix on day of discharge.   Left fibular fracture - Per Dr. Erlinda Hong from [facility], she is not a surgical candidate due to her chronic respiratory failure. - Patient is unable to weight bear and must be in CAM walker for 4-6 weeks. - She was recommended for SNF placement during last hospitalization, declined beforebut now accepts placement to SNF  Physical deconditioning due to above  comorbidities - PT eval, SNF placement.  Normocytic anemia, anemia of chronic kidney disease - Hemoglobin remained stable at 7.7-8 - No reports of bleeding    Procedures:  Chest x-ray 09/05/2016  Consultations:  Nephrology 09/12/2016   Discharge Exam: Vitals:   09/14/16 0544 09/14/16 1340  BP: 110/62 124/67  Pulse: 78 77  Resp: 17 18  Temp: 98.6 F (37 C) 98.6 F (37 C)    General: NAD Cardiovascular: RRR Respiratory: CTAB ANTERIOR LUNG FIELDS.  Discharge Instructions   Discharge Instructions    Diet - low sodium heart healthy    Complete by:  As directed    Increase activity slowly    Complete by:  As directed      Current Discharge Medication List    START taking these medications   Details  docusate sodium (COLACE) 100 MG capsule Take 1 capsule (100 mg total) by mouth 2 (two) times daily. Qty: 10 capsule, Refills: 0    enoxaparin (LOVENOX) 40 MG/0.4ML injection Inject 0.4 mLs (40 mg total) into the skin daily. Qty: 0 Syringe    polyethylene glycol (MIRALAX / GLYCOLAX) packet Take 17 g by  mouth 2 (two) times daily. Qty: 14 each, Refills: 0      CONTINUE these medications which have CHANGED   Details  oxyCODONE (OXY IR/ROXICODONE) 5 MG immediate release tablet Take 1 tablet (5 mg total) by mouth every 6 (six) hours as needed for breakthrough pain. Qty: 10 tablet, Refills: 0      CONTINUE these medications which have NOT CHANGED   Details  albuterol (PROVENTIL HFA;VENTOLIN HFA) 108 (90 Base) MCG/ACT inhaler Inhale 2 puffs into the lungs every 6 (six) hours as needed for wheezing or shortness of breath. Qty: 1 Inhaler, Refills: 11    carvedilol (COREG) 25 MG tablet Take 50 mg by mouth 2 (two) times daily with a meal.    diclofenac sodium (VOLTAREN) 1 % GEL Apply 4 g topically 4 (four) times daily as needed (for pain).    diltiazem (DILACOR XR) 180 MG 24 hr capsule Take 180 mg by mouth daily.    furosemide (LASIX) 80 MG tablet Take 80 mg by  mouth 2 (two) times daily.    gabapentin (NEURONTIN) 300 MG capsule Take 1 capsule (300 mg total) by mouth at bedtime. Qty: 30 capsule, Refills: 0    Multiple Vitamin (MULTIVITAMIN WITH MINERALS) TABS tablet Take 1 tablet by mouth daily.    omega-3 acid ethyl esters (LOVAZA) 1 g capsule Take 1 g by mouth 2 (two) times daily.    propafenone (RYTHMOL) 150 MG tablet Take 150 mg by mouth every 8 (eight) hours.       Allergies  Allergen Reactions  . Penicillins Hives, Itching, Swelling and Other (See Comments)    Reaction:  Tongue swelling Has patient had a PCN reaction causing immediate rash, facial/tongue/throat swelling, SOB or lightheadedness with hypotension: Yes Has patient had a PCN reaction causing severe rash involving mucus membranes or skin necrosis: No Has patient had a PCN reaction that required hospitalization No Has patient had a PCN reaction occurring within the last 10 years: No If all of the above answers are "NO", then may proceed with Cephalosporin use.  [name] Citrus Hives   Follow-up Information    MD AT SNF Follow up.        Eduard Roux, MD. Schedule an appointment as soon as possible for a visit in 3 week(s).   Specialty:  Orthopedic Surgery Why:  f/u in 3-4 weeks Contact information: [address] [place] [address] [phone]            The results of significant diagnostics from this hospitalization (including imaging, microbiology, ancillary and laboratory) are listed below for reference.    Significant Diagnostic Studies: Dg Chest 2 View  Result Date: 09/05/2016 CLINICAL DATA:  Shortness of Breath EXAM: CHEST  2 VIEW COMPARISON:  08/28/2016 FINDINGS: Cardiac shadow is again enlarged in size. Aortic calcifications are stable. The lungs are well aerated bilaterally. No focal infiltrate or sizable effusion is seen. No acute bony abnormality is noted. IMPRESSION: No acute abnormality noted. Electronically Signed   By: Inez Catalina  M.D.   On: 09/05/2016 15:34   Dg Chest 2 View  Result Date: 08/28/2016 CLINICAL DATA:  Dyspnea today. EXAM: CHEST  2 VIEW COMPARISON:  04/04/2016 FINDINGS: There is marked cardiomegaly, unchanged. There is mild vascular fullness, unchanged and likely chronic. No airspace consolidation. No effusions. No pneumothorax. Hilar and mediastinal contours are unremarkable and unchanged. IMPRESSION: Stable cardiomegaly.  No consolidation or effusion. Electronically Signed   By: Andreas Newport M.D.   On: 08/28/2016 21:52  Dg Ankle Complete Left  Result Date: 08/30/2016 CLINICAL DATA:  Fall 2 days ago.  Left ankle pain. EXAM: LEFT ANKLE COMPLETE - 3+ VIEW COMPARISON:  None. FINDINGS: There is an oblique fracture through the distal right fibula, minimally displaced. No visible tibial abnormality. Diffuse soft tissue swelling. Ankle mortise appears intact. IMPRESSION: Oblique minimally displaced fracture through the distal left fibula. Electronically Signed   By: Rolm Baptise M.D.   On: 08/30/2016 13:34   US Renal  Result Date: 09/13/2016 CLINICAL DATA:  Acute kidney injury EXAM: RENAL / URINARY TRACT ULTRASOUND COMPLETE COMPARISON:  03/08/2016 FINDINGS: Right Kidney: Length: 12.8 cm. Echogenicity within normal limits. No mass or hydronephrosis visualized. Left Kidney: Length: 11.6 cm. Echogenicity within normal limits. No mass or hydronephrosis visualized. Bladder: Limited visualization. Appears normal for degree of bladder distention. IMPRESSION: Negative renal ultrasound. Electronically Signed   By: Monte Fantasia M.D.   On: 09/13/2016 10:25   Dg Knee Complete 4 Views Left  Result Date: 08/29/2016 CLINICAL DATA:  Pain after fall. EXAM: LEFT KNEE - COMPLETE 4+ VIEW COMPARISON:  None. FINDINGS: There is a moderate suprapatellar joint effusion. Severe tricompartmental degenerative changes are noted. No fractures are seen. IMPRESSION: Moderate suprapatellar effusion with no visualized fracture.  Electronically Signed   By: Dorise Bullion III M.D   On: 08/29/2016 01:37    Microbiology: No results found for this or any previous visit (from the past 240 hour(s)).   Labs: Basic Metabolic Panel:  Recent Labs Lab 09/09/16 0549 09/10/16 0520 09/12/16 0540 09/13/16 0512 09/14/16 0604  NA 138 136 133* 135 135  K 4.0 4.2 4.6 4.4 4.5  CL 93* 90* 88* 91* 92*  CO2 38* 35* 33* 34* 33*  GLUCOSE 115* 124* 124* 96 106*  BUN 67* 77* 88* 82* 85*  CREATININE 2.14* 2.55* 2.68* 2.27* 2.14*  CALCIUM 8.9 8.8* 8.8* 8.7* 9.1  PHOS  --   --   --   --  5.2*   Liver Function Tests:  Recent Labs Lab 09/14/16 0604  ALBUMIN 3.5   No results for input(s): LIPASE, AMYLASE in the last 168 hours. No results for input(s): AMMONIA in the last 168 hours. CBC:  Recent Labs Lab 09/10/16 0520 09/11/16 0546 09/12/16 0540 09/13/16 0512 09/14/16 0604  WBC 8.1 8.3 7.9 7.0 7.4  HGB 7.8* 7.7* 7.7* 8.0* 7.7*  HCT 26.5* 26.1* 26.0* 27.2* 26.6*  MCV 88.6 86.1 88.7 89.8 89.6  PLT 188 216 194 207 240   Cardiac Enzymes: No results for input(s): CKTOTAL, CKMB, CKMBINDEX, TROPONINI in the last 168 hours. BNP: BNP (last 3 results)  Recent Labs  04/04/16 1055 08/28/16 2132 09/05/16 1555  BNP 85.7 529.0* 665.3*    ProBNP (last 3 results) No results for input(s): PROBNP in the last 8760 hours.  CBG: No results for input(s): GLUCAP in the last 168 hours.     SignedIrine Seal MD.  [facility] 09/14/2016, 3:21 PM

## 2016-09-14 NOTE — Progress Notes (Signed)
Subjective:  Now no  uop recorded ?  creatinine down slightly- BP is fine - says is making urine- thinks she may go to [employer] today  Objective Vital signs in last 24 hours: Vitals:   09/13/16 1258 09/13/16 1800 09/13/16 2117 09/14/16 0544  BP: (!) 150/81 (!) 139/94 (!) 142/70 110/62  Pulse: 74  68 78  Resp: 18  17 17   Temp: 97.8 F (36.6 C)  98.5 F (36.9 C) 98.6 F (37 C)  TempSrc: Oral  Oral Oral  SpO2: 93%  91% 94%  Weight:      Height:       Weight change:   Intake/Output Summary (Last 24 hours) at 09/14/16 1250 Last data filed at 09/13/16 1300  Gross per 24 hour  Intake              120 ml  Output                0 ml  Net              120 ml   Assessment: 1  Acute on CKD 3/4 - baseline creat 1.6- 2.0.  Creat was 1.6 on admission and progressively rising over last 8 days in hospital.  Had been getting IV lasix then po lasix daily since admission.  Looked dry on exam on 12/3, no edema, and BP's progressively lower since admission.  Suspected intravasc vol depletion and hypoperfusion from low BP's / BP meds (dilt, coreg for afib).   lasix stopped, given gentle IV NS (later stopped) and decreased dilt and coreg dosing. Let BP come up > 110- 120 range.  Urine negative for blood and protein so not suspicious for an intrarenal process.  "i know that I need lasix" will resume today.  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 Anemia- also may not be helping- have added ESA  iron stores low- will replete   Paige Marshall A    Labs: Basic Metabolic Panel:  Recent Labs Lab 09/12/16 0540 09/13/16 0512  09/14/16 0604  NA 133* 135 135  K 4.6 4.4 4.5  CL 88* 91* 92*  CO2 33* 34* 33*  GLUCOSE 124* 96 106*  BUN 88* 82* 85*  CREATININE 2.68* 2.27* 2.14*  CALCIUM 8.8* 8.7* 9.1  PHOS  --   --  5.2*   Liver Function Tests:  Recent Labs Lab 09/14/16 0604  ALBUMIN 3.5   No results for input(s): LIPASE, AMYLASE in the last 168 hours. No results for input(s): AMMONIA in the last 168 hours. CBC:  Recent Labs Lab 09/10/16 0520 09/11/16 0546 09/12/16 0540 09/13/16 0512 09/14/16 0604  WBC 8.1 8.3 7.9 7.0 7.4  HGB 7.8* 7.7* 7.7* 8.0* 7.7*  HCT 26.5* 26.1* 26.0* 27.2* 26.6*  MCV 88.6 86.1 88.7 89.8 89.6  PLT 188 216 194 207 240   Cardiac Enzymes: No results for input(s): CKTOTAL, CKMB, CKMBINDEX, TROPONINI in the last 168 hours. CBG: No results for input(s): GLUCAP in the last 168 hours.  Iron Studies:   Recent  Labs  09/14/16 0604  IRON 40  TIBC 339   Studies/Results: US Renal  Result Date: 09/13/2016 CLINICAL DATA:  Acute kidney injury EXAM: RENAL / URINARY TRACT ULTRASOUND COMPLETE COMPARISON:  03/08/2016 FINDINGS: Right Kidney: Length: 12.8 cm. Echogenicity within normal limits. No mass or hydronephrosis visualized. Left Kidney: Length: 11.6 cm. Echogenicity within normal limits. No mass or hydronephrosis visualized. Bladder: Limited visualization. Appears normal for degree of bladder distention. IMPRESSION: Negative renal ultrasound. Electronically Signed   By: Monte Fantasia M.D.   On: 09/13/2016 10:25   Medications: Infusions:   Scheduled Medications: . carvedilol  12.5 mg Oral BID WC  . darbepoetin (ARANESP) injection - NON-DIALYSIS  100 mcg Subcutaneous Q Mon-1800  . diltiazem  30 mg Oral Q6H  . docusate sodium  100 mg Oral BID  . enoxaparin (LOVENOX) injection  40 mg Subcutaneous Q24H  . furosemide  80 mg Oral BID  . gabapentin  300 mg Oral QHS  . multivitamin with minerals  1 tablet Oral Daily  . omega-3 acid ethyl esters  1 g Oral BID  . propafenone  150  mg Oral Q8H    have reviewed scheduled and prn medications.  Physical Exam: General: alert, NAD Heart: RRR Lungs: difficult exam Abdomen: obese, soft Extremities: obese- difficult to tell pitting    09/14/2016,12:50 PM  LOS: 8 days

## 2016-09-14 NOTE — Progress Notes (Signed)
Called patient's son, Delfina Redwood, to let him know that his Mom was being picked up for transport.

## 2016-09-14 NOTE — Care Management Important Message (Signed)
Important Message  Patient Details  Name: Paige Marshall MRN: [id number] Date of Birth: 1955/10/26   Medicare Important Message Given:  Yes    Camillo Flaming 09/14/2016, 10:16 AMImportant Message  Patient Details  Name: Paige Marshall MRN: [id number] Date of Birth: [birth year]   Medicare Important Message Given:  Yes    Camillo Flaming 09/14/2016, 10:16 AM

## 2016-09-16 DIAGNOSIS — I509 Heart failure, unspecified: Secondary | ICD-10-CM | POA: Diagnosis not present

## 2016-09-16 DIAGNOSIS — R609 Edema, unspecified: Secondary | ICD-10-CM | POA: Diagnosis not present

## 2016-09-17 DIAGNOSIS — I480 Paroxysmal atrial fibrillation: Secondary | ICD-10-CM | POA: Diagnosis not present

## 2016-09-17 DIAGNOSIS — J9621 Acute and chronic respiratory failure with hypoxia: Secondary | ICD-10-CM | POA: Diagnosis not present

## 2016-09-17 DIAGNOSIS — I5033 Acute on chronic diastolic (congestive) heart failure: Secondary | ICD-10-CM | POA: Diagnosis not present

## 2016-09-18 ENCOUNTER — Encounter (HOSPITAL_COMMUNITY): Payer: Self-pay

## 2016-09-18 ENCOUNTER — Emergency Department (HOSPITAL_COMMUNITY): Payer: Medicare Other

## 2016-09-18 ENCOUNTER — Inpatient Hospital Stay (HOSPITAL_COMMUNITY): Payer: Medicare Other

## 2016-09-18 ENCOUNTER — Inpatient Hospital Stay (HOSPITAL_COMMUNITY)
Admission: EM | Admit: 2016-09-18 | Discharge: 2016-09-23 | DRG: 291 | Disposition: A | Discharge status: Skilled Nursing Facility | Payer: Medicare Other | Attending: Internal Medicine | Admitting: Internal Medicine

## 2016-09-18 DIAGNOSIS — I509 Heart failure, unspecified: Secondary | ICD-10-CM | POA: Diagnosis not present

## 2016-09-18 DIAGNOSIS — S82402A Unspecified fracture of shaft of left fibula, initial encounter for closed fracture: Secondary | ICD-10-CM | POA: Diagnosis present

## 2016-09-18 DIAGNOSIS — K8020 Calculus of gallbladder without cholecystitis without obstruction: Secondary | ICD-10-CM | POA: Diagnosis not present

## 2016-09-18 DIAGNOSIS — I959 Hypotension, unspecified: Secondary | ICD-10-CM | POA: Diagnosis not present

## 2016-09-18 DIAGNOSIS — K7200 Acute and subacute hepatic failure without coma: Secondary | ICD-10-CM | POA: Diagnosis present

## 2016-09-18 DIAGNOSIS — G253 Myoclonus: Secondary | ICD-10-CM | POA: Diagnosis present

## 2016-09-18 DIAGNOSIS — M6281 Muscle weakness (generalized): Secondary | ICD-10-CM | POA: Diagnosis not present

## 2016-09-18 DIAGNOSIS — W19XXXD Unspecified fall, subsequent encounter: Secondary | ICD-10-CM | POA: Diagnosis present

## 2016-09-18 DIAGNOSIS — R0902 Hypoxemia: Secondary | ICD-10-CM | POA: Diagnosis not present

## 2016-09-18 DIAGNOSIS — R7989 Other specified abnormal findings of blood chemistry: Secondary | ICD-10-CM | POA: Diagnosis not present

## 2016-09-18 DIAGNOSIS — J9622 Acute and chronic respiratory failure with hypercapnia: Secondary | ICD-10-CM | POA: Diagnosis present

## 2016-09-18 DIAGNOSIS — E662 Morbid (severe) obesity with alveolar hypoventilation: Secondary | ICD-10-CM | POA: Diagnosis present

## 2016-09-18 DIAGNOSIS — R609 Edema, unspecified: Secondary | ICD-10-CM | POA: Diagnosis not present

## 2016-09-18 DIAGNOSIS — J449 Chronic obstructive pulmonary disease, unspecified: Secondary | ICD-10-CM | POA: Diagnosis present

## 2016-09-18 DIAGNOSIS — M109 Gout, unspecified: Secondary | ICD-10-CM | POA: Diagnosis present

## 2016-09-18 DIAGNOSIS — N179 Acute kidney failure, unspecified: Secondary | ICD-10-CM | POA: Diagnosis present

## 2016-09-18 DIAGNOSIS — N189 Chronic kidney disease, unspecified: Secondary | ICD-10-CM

## 2016-09-18 DIAGNOSIS — R571 Hypovolemic shock: Secondary | ICD-10-CM | POA: Diagnosis not present

## 2016-09-18 DIAGNOSIS — N320 Bladder-neck obstruction: Secondary | ICD-10-CM | POA: Diagnosis present

## 2016-09-18 DIAGNOSIS — D631 Anemia in chronic kidney disease: Secondary | ICD-10-CM | POA: Diagnosis present

## 2016-09-18 DIAGNOSIS — I5033 Acute on chronic diastolic (congestive) heart failure: Secondary | ICD-10-CM | POA: Diagnosis present

## 2016-09-18 DIAGNOSIS — N183 Chronic kidney disease, stage 3 (moderate): Secondary | ICD-10-CM | POA: Diagnosis present

## 2016-09-18 DIAGNOSIS — M069 Rheumatoid arthritis, unspecified: Secondary | ICD-10-CM | POA: Diagnosis present

## 2016-09-18 DIAGNOSIS — Z9119 Patient's noncompliance with other medical treatment and regimen: Secondary | ICD-10-CM

## 2016-09-18 DIAGNOSIS — Z9981 Dependence on supplemental oxygen: Secondary | ICD-10-CM | POA: Diagnosis not present

## 2016-09-18 DIAGNOSIS — E875 Hyperkalemia: Secondary | ICD-10-CM | POA: Diagnosis present

## 2016-09-18 DIAGNOSIS — G4733 Obstructive sleep apnea (adult) (pediatric): Secondary | ICD-10-CM | POA: Diagnosis not present

## 2016-09-18 DIAGNOSIS — I480 Paroxysmal atrial fibrillation: Secondary | ICD-10-CM | POA: Diagnosis present

## 2016-09-18 DIAGNOSIS — I951 Orthostatic hypotension: Secondary | ICD-10-CM

## 2016-09-18 DIAGNOSIS — R570 Cardiogenic shock: Secondary | ICD-10-CM

## 2016-09-18 DIAGNOSIS — I2729 Other secondary pulmonary hypertension: Secondary | ICD-10-CM | POA: Diagnosis present

## 2016-09-18 DIAGNOSIS — I130 Hypertensive heart and chronic kidney disease with heart failure and stage 1 through stage 4 chronic kidney disease, or unspecified chronic kidney disease: Secondary | ICD-10-CM

## 2016-09-18 DIAGNOSIS — I10 Essential (primary) hypertension: Secondary | ICD-10-CM | POA: Diagnosis not present

## 2016-09-18 DIAGNOSIS — J989 Respiratory disorder, unspecified: Secondary | ICD-10-CM | POA: Diagnosis not present

## 2016-09-18 DIAGNOSIS — Z955 Presence of coronary angioplasty implant and graft: Secondary | ICD-10-CM

## 2016-09-18 DIAGNOSIS — D649 Anemia, unspecified: Secondary | ICD-10-CM

## 2016-09-18 DIAGNOSIS — Z87891 Personal history of nicotine dependence: Secondary | ICD-10-CM

## 2016-09-18 DIAGNOSIS — M84364D Stress fracture, left fibula, subsequent encounter for fracture with routine healing: Secondary | ICD-10-CM | POA: Diagnosis not present

## 2016-09-18 DIAGNOSIS — Z8249 Family history of ischemic heart disease and other diseases of the circulatory system: Secondary | ICD-10-CM

## 2016-09-18 DIAGNOSIS — J9600 Acute respiratory failure, unspecified whether with hypoxia or hypercapnia: Secondary | ICD-10-CM | POA: Diagnosis not present

## 2016-09-18 DIAGNOSIS — R278 Other lack of coordination: Secondary | ICD-10-CM | POA: Diagnosis present

## 2016-09-18 DIAGNOSIS — E7800 Pure hypercholesterolemia, unspecified: Secondary | ICD-10-CM | POA: Diagnosis present

## 2016-09-18 DIAGNOSIS — R001 Bradycardia, unspecified: Secondary | ICD-10-CM | POA: Diagnosis not present

## 2016-09-18 DIAGNOSIS — R740 Nonspecific elevation of levels of transaminase and lactic acid dehydrogenase [LDH]: Secondary | ICD-10-CM

## 2016-09-18 DIAGNOSIS — M170 Bilateral primary osteoarthritis of knee: Secondary | ICD-10-CM | POA: Diagnosis present

## 2016-09-18 DIAGNOSIS — J9601 Acute respiratory failure with hypoxia: Secondary | ICD-10-CM

## 2016-09-18 DIAGNOSIS — I5043 Acute on chronic combined systolic (congestive) and diastolic (congestive) heart failure: Secondary | ICD-10-CM | POA: Diagnosis present

## 2016-09-18 DIAGNOSIS — J9621 Acute and chronic respiratory failure with hypoxia: Secondary | ICD-10-CM | POA: Diagnosis present

## 2016-09-18 DIAGNOSIS — I272 Pulmonary hypertension, unspecified: Secondary | ICD-10-CM | POA: Diagnosis present

## 2016-09-18 DIAGNOSIS — S82402D Unspecified fracture of shaft of left fibula, subsequent encounter for closed fracture with routine healing: Secondary | ICD-10-CM

## 2016-09-18 DIAGNOSIS — R7401 Elevation of levels of liver transaminase levels: Secondary | ICD-10-CM

## 2016-09-18 DIAGNOSIS — K5900 Constipation, unspecified: Secondary | ICD-10-CM | POA: Diagnosis present

## 2016-09-18 DIAGNOSIS — Z6841 Body Mass Index (BMI) 40.0 and over, adult: Secondary | ICD-10-CM

## 2016-09-18 DIAGNOSIS — F40240 Claustrophobia: Secondary | ICD-10-CM | POA: Diagnosis present

## 2016-09-18 DIAGNOSIS — T426X5A Adverse effect of other antiepileptic and sedative-hypnotic drugs, initial encounter: Secondary | ICD-10-CM | POA: Diagnosis present

## 2016-09-18 DIAGNOSIS — Z880 Allergy status to penicillin: Secondary | ICD-10-CM

## 2016-09-18 DIAGNOSIS — Z7901 Long term (current) use of anticoagulants: Secondary | ICD-10-CM

## 2016-09-18 DIAGNOSIS — N170 Acute kidney failure with tubular necrosis: Secondary | ICD-10-CM | POA: Diagnosis not present

## 2016-09-18 DIAGNOSIS — Z8673 Personal history of transient ischemic attack (TIA), and cerebral infarction without residual deficits: Secondary | ICD-10-CM

## 2016-09-18 DIAGNOSIS — E119 Type 2 diabetes mellitus without complications: Secondary | ICD-10-CM | POA: Diagnosis not present

## 2016-09-18 DIAGNOSIS — E785 Hyperlipidemia, unspecified: Secondary | ICD-10-CM | POA: Diagnosis present

## 2016-09-18 DIAGNOSIS — R079 Chest pain, unspecified: Secondary | ICD-10-CM | POA: Diagnosis not present

## 2016-09-18 DIAGNOSIS — R0602 Shortness of breath: Secondary | ICD-10-CM | POA: Diagnosis not present

## 2016-09-18 LAB — CBC WITH DIFFERENTIAL/PLATELET
BASOS ABS: 0 10*3/uL (ref 0.0–0.1)
Basophils Relative: 0 %
Eosinophils Absolute: 0.1 10*3/uL (ref 0.0–0.7)
Eosinophils Relative: 1 %
HEMATOCRIT: 25.1 % — AB (ref 36.0–46.0)
Hemoglobin: 7.3 g/dL — ABNORMAL LOW (ref 12.0–15.0)
LYMPHS ABS: 0.8 10*3/uL (ref 0.7–4.0)
LYMPHS PCT: 11 %
MCH: 25.8 pg — ABNORMAL LOW (ref 26.0–34.0)
MCHC: 29.1 g/dL — ABNORMAL LOW (ref 30.0–36.0)
MCV: 88.7 fL (ref 78.0–100.0)
MONO ABS: 0.5 10*3/uL (ref 0.1–1.0)
Monocytes Relative: 6 %
NEUTROS ABS: 6.3 10*3/uL (ref 1.7–7.7)
Neutrophils Relative %: 82 %
Platelets: 194 10*3/uL (ref 150–400)
RBC: 2.83 MIL/uL — AB (ref 3.87–5.11)
RDW: 19.9 % — AB (ref 11.5–15.5)
WBC: 7.6 10*3/uL (ref 4.0–10.5)

## 2016-09-18 LAB — COMPREHENSIVE METABOLIC PANEL
ALT: 243 U/L — AB (ref 14–54)
AST: 278 U/L — AB (ref 15–41)
Albumin: 2.8 g/dL — ABNORMAL LOW (ref 3.5–5.0)
Alkaline Phosphatase: 78 U/L (ref 38–126)
Anion gap: 11 (ref 5–15)
BUN: 96 mg/dL — AB (ref 6–20)
CHLORIDE: 90 mmol/L — AB (ref 101–111)
CO2: 31 mmol/L (ref 22–32)
CREATININE: 3.71 mg/dL — AB (ref 0.44–1.00)
Calcium: 8.9 mg/dL (ref 8.9–10.3)
GFR calc Af Amer: 14 mL/min — ABNORMAL LOW (ref >60)
GFR calc non Af Amer: 12 mL/min — ABNORMAL LOW (ref >60)
GLUCOSE: 134 mg/dL — AB (ref 65–99)
Potassium: 5.6 mmol/L — ABNORMAL HIGH (ref 3.5–5.1)
SODIUM: 132 mmol/L — AB (ref 135–145)
Total Bilirubin: 1.3 mg/dL — ABNORMAL HIGH (ref 0.3–1.2)
Total Protein: 6.2 g/dL — ABNORMAL LOW (ref 6.5–8.1)

## 2016-09-18 LAB — I-STAT VENOUS BLOOD GAS, ED
Acid-Base Excess: 9 mmol/L — ABNORMAL HIGH (ref 0.0–2.0)
Bicarbonate: 35.5 mmol/L — ABNORMAL HIGH (ref 20.0–28.0)
O2 SAT: 85 %
TCO2: 37 mmol/L (ref 0–100)
pCO2, Ven: 63.5 mmHg — ABNORMAL HIGH (ref 44.0–60.0)
pH, Ven: 7.356 (ref 7.250–7.430)
pO2, Ven: 54 mmHg — ABNORMAL HIGH (ref 32.0–45.0)

## 2016-09-18 LAB — PROCALCITONIN: PROCALCITONIN: 1.2 ng/mL

## 2016-09-18 LAB — BASIC METABOLIC PANEL
Anion gap: 13 (ref 5–15)
BUN: 94 mg/dL — AB (ref 6–20)
CHLORIDE: 88 mmol/L — AB (ref 101–111)
CO2: 28 mmol/L (ref 22–32)
CREATININE: 4.09 mg/dL — AB (ref 0.44–1.00)
Calcium: 8.7 mg/dL — ABNORMAL LOW (ref 8.9–10.3)
GFR calc non Af Amer: 11 mL/min — ABNORMAL LOW (ref >60)
GFR, EST AFRICAN AMERICAN: 13 mL/min — AB (ref >60)
Glucose, Bld: 100 mg/dL — ABNORMAL HIGH (ref 65–99)
Potassium: 5.4 mmol/L — ABNORMAL HIGH (ref 3.5–5.1)
SODIUM: 129 mmol/L — AB (ref 135–145)

## 2016-09-18 LAB — BRAIN NATRIURETIC PEPTIDE: B NATRIURETIC PEPTIDE 5: 991 pg/mL — AB (ref 0.0–100.0)

## 2016-09-18 LAB — PHOSPHORUS: Phosphorus: 8.4 mg/dL — ABNORMAL HIGH (ref 2.5–4.6)

## 2016-09-18 LAB — I-STAT TROPONIN, ED: Troponin i, poc: 0 ng/mL (ref 0.00–0.08)

## 2016-09-18 LAB — HEMOGLOBIN AND HEMATOCRIT, BLOOD
HCT: 26.3 % — ABNORMAL LOW (ref 36.0–46.0)
HEMATOCRIT: 25.8 % — AB (ref 36.0–46.0)
HEMOGLOBIN: 7.7 g/dL — AB (ref 12.0–15.0)
HEMOGLOBIN: 7.6 g/dL — AB (ref 12.0–15.0)

## 2016-09-18 LAB — SODIUM, URINE, RANDOM

## 2016-09-18 LAB — MAGNESIUM: MAGNESIUM: 3.1 mg/dL — AB (ref 1.7–2.4)

## 2016-09-18 LAB — MRSA PCR SCREENING: MRSA BY PCR: NEGATIVE

## 2016-09-18 LAB — CREATININE, URINE, RANDOM: CREATININE, URINE: 212.77 mg/dL

## 2016-09-18 LAB — ABO/RH: ABO/RH(D): A POS

## 2016-09-18 LAB — LACTIC ACID, PLASMA: LACTIC ACID, VENOUS: 1.8 mmol/L (ref 0.5–1.9)

## 2016-09-18 MED ORDER — HEPARIN (PORCINE) IN NACL 100-0.45 UNIT/ML-% IJ SOLN
2550.0000 [IU]/h | INTRAMUSCULAR | Status: DC
  Administered 2016-09-18: 1500 [IU]/h via INTRAVENOUS
  Administered 2016-09-19 (×2): 1900 [IU]/h via INTRAVENOUS
  Administered 2016-09-20: 2100 [IU]/h via INTRAVENOUS
  Administered 2016-09-21 – 2016-09-22 (×2): 2350 [IU]/h via INTRAVENOUS
  Filled 2016-09-18 (×9): qty 250

## 2016-09-18 MED ORDER — VANCOMYCIN HCL 10 G IV SOLR
2000.0000 mg | INTRAVENOUS | Status: DC
  Administered 2016-09-18: 2000 mg via INTRAVENOUS
  Filled 2016-09-18: qty 2000

## 2016-09-18 MED ORDER — SODIUM CHLORIDE 0.9 % IV BOLUS (SEPSIS)
500.0000 mL | Freq: Once | INTRAVENOUS | Status: AC
  Administered 2016-09-18: 500 mL via INTRAVENOUS

## 2016-09-18 MED ORDER — SODIUM CHLORIDE 0.9 % IV SOLN
INTRAVENOUS | Status: DC
  Administered 2016-09-18: 10 mL/h via INTRAVENOUS
  Administered 2016-09-18: 20:00:00 via INTRAVENOUS

## 2016-09-18 MED ORDER — DOPAMINE-DEXTROSE 3.2-5 MG/ML-% IV SOLN: 0.0000 ug/kg/min | INTRAVENOUS | Status: DC

## 2016-09-18 MED ORDER — SODIUM CHLORIDE 0.9 % IV BOLUS (SEPSIS)
1000.0000 mL | Freq: Once | INTRAVENOUS | Status: AC
  Administered 2016-09-18: 1000 mL via INTRAVENOUS

## 2016-09-18 MED ORDER — DILTIAZEM HCL ER 180 MG PO CP24: 180.0000 mg | ORAL_CAPSULE | Freq: Every day | ORAL | Status: DC

## 2016-09-18 MED ORDER — SODIUM CHLORIDE 0.9% FLUSH
3.0000 mL | Freq: Two times a day (BID) | INTRAVENOUS | Status: DC
  Administered 2016-09-18 – 2016-09-23 (×6): 3 mL via INTRAVENOUS

## 2016-09-18 MED ORDER — OMEGA-3-ACID ETHYL ESTERS 1 G PO CAPS
1.0000 g | ORAL_CAPSULE | Freq: Two times a day (BID) | ORAL | Status: DC
  Administered 2016-09-19 – 2016-09-23 (×9): 1 g via ORAL
  Filled 2016-09-18 (×10): qty 1

## 2016-09-18 MED ORDER — ATROPINE SULFATE 1 MG/10ML IJ SOSY: 0.5000 mg | PREFILLED_SYRINGE | INTRAMUSCULAR | Status: DC | PRN

## 2016-09-18 MED ORDER — ALBUTEROL SULFATE (2.5 MG/3ML) 0.083% IN NEBU
2.5000 mg | INHALATION_SOLUTION | RESPIRATORY_TRACT | Status: DC | PRN
  Filled 2016-09-18: qty 3

## 2016-09-18 MED ORDER — ONDANSETRON HCL 4 MG PO TABS: 4.0000 mg | ORAL_TABLET | Freq: Four times a day (QID) | ORAL | Status: DC | PRN

## 2016-09-18 MED ORDER — DEXTROSE 5 % IV SOLN
1.0000 g | Freq: Three times a day (TID) | INTRAVENOUS | Status: DC
  Administered 2016-09-18 – 2016-09-19 (×2): 1 g via INTRAVENOUS
  Filled 2016-09-18 (×4): qty 1

## 2016-09-18 MED ORDER — DOCUSATE SODIUM 100 MG PO CAPS
100.0000 mg | ORAL_CAPSULE | Freq: Two times a day (BID) | ORAL | Status: DC
  Administered 2016-09-19 – 2016-09-23 (×8): 100 mg via ORAL
  Filled 2016-09-18 (×9): qty 1

## 2016-09-18 MED ORDER — SODIUM CHLORIDE 0.9 % IV SOLN: INTRAVENOUS | Status: DC | PRN

## 2016-09-18 MED ORDER — SODIUM CHLORIDE 0.9 % IV SOLN
INTRAVENOUS | Status: DC
  Administered 2016-09-18: 16:00:00 via INTRAVENOUS

## 2016-09-18 MED ORDER — OXYCODONE HCL 5 MG PO TABS
5.0000 mg | ORAL_TABLET | Freq: Once | ORAL | Status: AC
  Administered 2016-09-18: 5 mg via ORAL
  Filled 2016-09-18: qty 1

## 2016-09-18 MED ORDER — DILTIAZEM HCL ER COATED BEADS 180 MG PO CP24: 180.0000 mg | ORAL_CAPSULE | Freq: Every day | ORAL | Status: DC

## 2016-09-18 MED ORDER — POLYETHYLENE GLYCOL 3350 17 G PO PACK
17.0000 g | PACK | Freq: Two times a day (BID) | ORAL | Status: DC
  Administered 2016-09-19 – 2016-09-20 (×4): 17 g via ORAL
  Filled 2016-09-18 (×5): qty 1

## 2016-09-18 MED ORDER — HEPARIN BOLUS VIA INFUSION
4000.0000 [IU] | Freq: Once | INTRAVENOUS | Status: AC
  Administered 2016-09-18: 4000 [IU] via INTRAVENOUS
  Filled 2016-09-18: qty 4000

## 2016-09-18 MED ORDER — ACETAMINOPHEN 650 MG RE SUPP: 650.0000 mg | Freq: Four times a day (QID) | RECTAL | Status: DC | PRN

## 2016-09-18 MED ORDER — GLUCAGON HCL RDNA (DIAGNOSTIC) 1 MG IJ SOLR
5.0000 mg | Freq: Once | INTRAVENOUS | Status: DC
  Filled 2016-09-18: qty 5

## 2016-09-18 MED ORDER — ACETAMINOPHEN 325 MG PO TABS: 650.0000 mg | ORAL_TABLET | Freq: Four times a day (QID) | ORAL | Status: DC | PRN

## 2016-09-18 MED ORDER — PROPAFENONE HCL 150 MG PO TABS
150.0000 mg | ORAL_TABLET | Freq: Three times a day (TID) | ORAL | Status: DC
  Filled 2016-09-18 (×2): qty 1

## 2016-09-18 MED ORDER — ONDANSETRON HCL 4 MG/2ML IJ SOLN: 4.0000 mg | Freq: Four times a day (QID) | INTRAMUSCULAR | Status: DC | PRN

## 2016-09-18 MED ORDER — SODIUM CHLORIDE 0.9 % IV BOLUS (SEPSIS)
250.0000 mL | Freq: Once | INTRAVENOUS | Status: AC
  Administered 2016-09-18: 250 mL via INTRAVENOUS

## 2016-09-18 MED ORDER — HYDRALAZINE HCL 20 MG/ML IJ SOLN: 10.0000 mg | Freq: Three times a day (TID) | INTRAMUSCULAR | Status: DC | PRN

## 2016-09-18 MED ORDER — CARVEDILOL 25 MG PO TABS: 50.0000 mg | ORAL_TABLET | Freq: Two times a day (BID) | ORAL | Status: DC

## 2016-09-18 NOTE — ED Notes (Addendum)
Pt o2 sats 77% on 3L, adjusted [place] flow rate to 6 liters, o2 sats remained in low 80's.  Placed pt on non-rebreather with 10L O2, pt states increased comfort, o2 sats elevated to 96%.

## 2016-09-18 NOTE — Consult Note (Signed)
PULMONARY / CRITICAL CARE MEDICINE   Name: Paige Marshall MRN: [id number] DOB: 06-Nov-1955    ADMISSION DATE:  09/18/2016 CONSULTATION DATE:  09/18/16  REFERRING MD:  [facility] -Dr. Lorin Mercy   CHIEF COMPLAINT:  Hypotension Paige Marshall   HISTORY OF PRESENT ILLNESS:   60 y.o. female with medical history significant of NICM with EF 40-45% in April 2016, CKD stage 3 at baseline, PAF, HTN, morbid obesity, mixed type COPD. At baseline she uses O2 only at night, unable to tolerate CPAP.  She was admitted from 11/18-21 for syncope, right heart failure with NYHA class 3, PAF, morbid obesity, left fibular fracture, and acute on chronic respiratory failure.  She was recommended for SNF but she declined placement. Readmitted 11/26 to 12/5 for similar with acute on chronic hypoxic resp failure .  That improved with diuresis . She was discharged to rehab as she was considered not a surgical candidate and recommended for cam walker w/ no rehab for 4-6 weeks. . She returned to ER on 12/9 for increased sob and dry cough , felt she was feeling back up with fluid.  CXR shows no fluid overload, BNP is mildly elevated when compared to her discharge BNP. Patient's weight is up about 11 pounds since discharge.  Scr has trended up to 3.71 LFT are up as well.  She is unable to go for CTA due to kidney fxn and felt would not tolerate VQ d/t clautrophobia . On arrival to ER she was hypotensive with b/p 67/42. , HR bradycardic with 46 to 55 . Was discharged on lovenox 40mg  daily sq .  Pt was transferred to ICU , PCCM consulted     PAST MEDICAL HISTORY :  She  has a past medical history of Arthritis; Asthma; Chronic combined systolic and diastolic CHF (congestive heart failure) ([place]); CKD (chronic kidney disease), stage III; COPD (chronic obstructive pulmonary disease) ([place]); Degenerative joint disease of both lower legs; Edema; Former smoker; Gout; H. pylori infection (01/03/2014); High cholesterol; Hypertension; Morbid obesity  ([place]); NICM (nonischemic cardiomyopathy) ([place]); Nonischemic cardiomyopathy ([place]); Normal coronary arteries; OSA (obstructive sleep apnea); Paroxysmal atrial fibrillation ([place]); Pneumonia (X 2); Pulmonary hypertension; Respiratory failure ([place]); Rheumatoid arthritis ([place]); and Stroke ([place]).  PAST SURGICAL HISTORY: She  has a past surgical history that includes Breath tek h pylori (N/A, 12/31/2013); doppler echocardiography; Angiogram/lv (congenital) (2007); sleep study (2011); stress myocardial dipyridamole perfusion; venous duplex ultrasound (2013); Coronary angioplasty with stent (11/04/2005); Tubal ligation (1982); Eye surgery; Corneal transplant (Right, ~ 2010); and left heart catheterization with coronary angiogram (N/A, 01/20/2015).  Allergies  Allergen Reactions  . Penicillins Hives, Itching, Swelling and Other (See Comments)    Reaction:  Tongue swelling Has patient had a PCN reaction causing immediate rash, facial/tongue/throat swelling, SOB or lightheadedness with hypotension: Yes Has patient had a PCN reaction causing severe rash involving mucus membranes or skin necrosis: No Has patient had a PCN reaction that required hospitalization No Has patient had a PCN reaction occurring within the last 10 years: No If all of the above answers are "NO", then may proceed with Cephalosporin use.  . Citrus Hives    No current facility-administered medications on file prior to encounter.    Current Outpatient Prescriptions on File Prior to Encounter  Medication Sig  . albuterol (PROVENTIL HFA;VENTOLIN HFA) 108 (90 Base) MCG/ACT inhaler Inhale 2 puffs into the lungs every 6 (six) hours as needed for wheezing or shortness of breath.  . carvedilol (COREG) 25 MG tablet Take 50 mg by mouth  2 (two) times daily with a meal.  . diclofenac sodium (VOLTAREN) 1 % GEL Apply 4 g topically 4 (four) times daily as needed (for pain).  [name] diltiazem (DILACOR XR) 180 MG 24 hr capsule Take 180 mg by mouth daily.  [name]  docusate sodium (COLACE) 100 MG capsule Take 1 capsule (100 mg total) by mouth 2 (two) times daily.  [name] enoxaparin (LOVENOX) 40 MG/0.4ML injection Inject 0.4 mLs (40 mg total) into the skin daily.  . furosemide (LASIX) 80 MG tablet Take 80 mg by mouth 2 (two) times daily.  [name] gabapentin (NEURONTIN) 300 MG capsule Take 1 capsule (300 mg total) by mouth at bedtime.  . Multiple Vitamin (MULTIVITAMIN WITH MINERALS) TABS tablet Take 1 tablet by mouth daily.  [name] omega-3 acid ethyl esters (LOVAZA) 1 g capsule Take 1 g by mouth 2 (two) times daily.  [name] oxyCODONE (OXY IR/ROXICODONE) 5 MG immediate release tablet Take 1 tablet (5 mg total) by mouth every 6 (six) hours as needed for breakthrough pain.  . polyethylene glycol (MIRALAX / GLYCOLAX) packet Take 17 g by mouth 2 (two) times daily.  . propafenone (RYTHMOL) 150 MG tablet Take 150 mg by mouth every 8 (eight) hours.    FAMILY HISTORY:  Her indicated that her mother is deceased. She indicated that her father is deceased. She indicated that the status of her other is unknown.    SOCIAL HISTORY: She  reports that she quit smoking about 7 years ago. Her smoking use included Cigarettes. She has a 11.55 pack-year smoking history. She has never used smokeless tobacco. She reports that she drinks about 4.8 oz of alcohol per week . She reports that she does not use drugs.  REVIEW OF SYSTEMS:   Constitutional:   No  weight loss, night sweats,  Fevers, chills,  +fatigue, or  lassitude.  HEENT:   No headaches,  Difficulty swallowing,   No sneezing, itching, ear ache, nasal congestion, post nasal drip,   CV:  No chest pain,  Orthopnea, PND, +swelling in lower extremities,  No anasarca, dizziness, palpitations, syncope.   GI  No heartburn, indigestion, abdominal pain, nausea, vomiting, diarrhea, change in bowel habits, loss of appetite, bloody stools.   Resp: + shortness of breath with exertion   No excess mucus, no productive cough,  + non-productive cough,   No coughing up of blood.  No change in color of mucus.  No wheezing.  No chest wall deformity  Skin: no rash or lesions.  GU: no dysuria, change in color of urine, no urgency or frequency.  No flank pain, no hematuria   MS:  No joint pain or swelling.  No decreased range of motion.  No back pain.  Psych:  No change in mood or affect. No depression or anxiety.  No memory loss.    SUBJECTIVE: + cough and sob    VITAL SIGNS: BP (!) 77/59   Pulse (!) 44   Temp 97.8 F (36.6 C) (Oral)   Resp 17   LMP 09/07/2011   SpO2 96%   HEMODYNAMICS:    VENTILATOR SETTINGS: FiO2 (%):  [3 %] 3 %  INTAKE / OUTPUT: No intake/output data recorded.  PHYSICAL EXAMINATION: General:  Sitting up talking in bed  Neuro:  A/O x 3 , no focal deficits noted  HEENT:  Oral mucosa moist  Cardiovascular:  SB , no m/r/g Lungs:  Lungs CTA Abdomen:  Obese , soft, NT , BS + x 4 Q Musculoskeletal:  Intact, previous  right LE fx  Skin:  Intact   LABS:  BMET  Recent Labs Lab 09/13/16 0512 09/14/16 0604 09/18/16 1126  NA 135 135 132*  K 4.4 4.5 5.6*  CL 91* 92* 90*  CO2 34* 33* 31  BUN 82* 85* 96*  CREATININE 2.27* 2.14* 3.71*  GLUCOSE 96 106* 134*    Electrolytes  Recent Labs Lab 09/13/16 0512 09/14/16 0604 09/18/16 1126 09/18/16 1607  CALCIUM 8.7* 9.1 8.9  --   MG  --   --   --  3.1*  PHOS  --  5.2*  --  8.4*    CBC  Recent Labs Lab 09/13/16 0512 09/14/16 0604 09/18/16 1126 09/18/16 1607  WBC 7.0 7.4 7.6  --   HGB 8.0* 7.7* 7.3* 7.7*  HCT 27.2* 26.6* 25.1* 26.3*  PLT 207 240 194  --     Coag's No results for input(s): APTT, INR in the last 168 hours.  Sepsis Markers No results for input(s): LATICACIDVEN, PROCALCITON, O2SATVEN in the last 168 hours.  ABG No results for input(s): PHART, PCO2ART, PO2ART in the last 168 hours.  Liver Enzymes  Recent Labs Lab 09/14/16 0604 09/18/16 1126  AST  --  278*  ALT  --  243*  ALKPHOS  --  78  BILITOT  --  1.3*   ALBUMIN 3.5 2.8*    Cardiac Enzymes No results for input(s): TROPONINI, PROBNP in the last 168 hours.  Glucose No results for input(s): GLUCAP in the last 168 hours.  Imaging Dg Chest Portable 1 View  Result Date: 09/18/2016 CLINICAL DATA:  Patient with chest pain and pressure. EXAM: PORTABLE CHEST 1 VIEW COMPARISON:  Chest radiograph 09/05/2016. FINDINGS: Monitoring leads overlie the patient. Stable cardiomegaly. Minimal heterogeneous opacities lung bases bilaterally. No pleural effusion or pneumothorax. IMPRESSION: Cardiomegaly.  Basilar opacities favored to represent atelectasis. Electronically Signed   By: Lovey Newcomer M.D.   On: 09/18/2016 12:12     STUDIES:    CULTURES:   ANTIBIOTICS:   SIGNIFICANT EVENTS:   LINES/TUBES:   DISCUSSION:   ASSESSMENT / PLAN:  PULMONARY A OSA  Acute on chronic hypercarbic/hypoxic RF  P:   BIPAP  At bedtime  And As needed   O2 to keep sat >90%  CARDIOVASCULAR A:  Bradycardia  Hypotension /shock  Acute on Chronic diastolic CHF  Hx of A Fib  P:  Hold coreg. Stat 2 D echo  Tr troponin  Check lactic acid  Hold on diuresis as b/p will not allow  Hold Cardizem, Coreg , rhythmol  Begin Hep Drip per pharm protocol -as  PE is def in the differential . Follow ven doppler/echo ,  Place  A line , if bp is low , will need to add pressors  And prob CVL    RENAL A:   Acute on chronic Renal Failure (Stage 3-4 w/ baseline scr 1.6 to 1.8 )  Hyperkalemia   P:   Renal following  Replace electrolytes  Repeat bmet now   GASTROINTESTINAL A:   Elevated LFT ? Shock Liver  P:   Trend LFT  NPO   HEMATOLOGIC A:   Anemia of chronic disease  P:  Tr CBC  Stat ven doppler /Echo  Hep drip per phar protocol   INFECTIOUS A:   No apparent infection -check urine  P:   Trend WBC/Temp  Check LA., PCT  Check urine , add cx if needed    ENDOCRINE A:     No acute finding  P:   Monitor BS on chem , add SSI if elevated    NEUROLOGIC A:   No acute process noted   P:   Avoid oversedation     FAMILY  - Updates: pt /family updated 12/9   - Inter-disciplinary family meet or Palliative Care meeting due by:  12/16    Tammy Parrett NP-C Pulmonary and [place] Pager: [phone]  09/18/2016, 5:32 PM

## 2016-09-18 NOTE — Consult Note (Addendum)
Reason for Consult:AKI/CKD Referring Physician:  Aggie Moats, MD  Paige Marshall is an 61 y.o. female.  HPI: Pt is a 60yo with PMH sig for CVA, RA, pulmonary HTN, morbid obesity, OSA, NICM (EF [id number]), COPD, diastolic and systolic CHF, and CKD stage 3-4 (baseline Scr 1.6-1.8) who was discharged from [facility] on 09/14/16 after presenting with increasing SOB and noted to have developed decompensated CHF with hypoxia complicated by AKI/CKD.  We were consulted on 09/12/16 when her Scr peaked at 2.68.  She responded to IVF's and Scr improved to 2.14 and was discharged to her SNF.  She now returns after developing increasing SOB and edema over the last 2 days.  She was hypotensive with BP of 67/42 and HR of 55.  We were again asked to help evaluate her AKI/CKD.  The trend in Scr is seen below.   Trend in Creatinine:  Creatinine, Ser  Date/Time Value Ref Range Status  09/18/2016 11:26 AM 3.71 (H) 0.44 - 1.00 mg/dL Final  09/14/2016 06:04 AM 2.14 (H) 0.44 - 1.00 mg/dL Final  09/13/2016 05:12 AM 2.27 (H) 0.44 - 1.00 mg/dL Final  09/12/2016 05:40 AM 2.68 (H) 0.44 - 1.00 mg/dL Final  09/10/2016 05:20 AM 2.55 (H) 0.44 - 1.00 mg/dL Final  09/09/2016 05:49 AM 2.14 (H) 0.44 - 1.00 mg/dL Final  09/06/2016 05:14 AM 1.62 (H) 0.44 - 1.00 mg/dL Final  09/05/2016 03:55 PM 1.65 (H) 0.44 - 1.00 mg/dL Final  09/01/2016 03:23 AM 2.55 (H) 0.44 - 1.00 mg/dL Final  08/31/2016 03:41 AM 2.40 (H) 0.44 - 1.00 mg/dL Final  08/30/2016 05:07 AM 2.34 (H) 0.44 - 1.00 mg/dL Final  08/29/2016 03:49 PM 2.41 (H) 0.44 - 1.00 mg/dL Final  08/28/2016 09:32 PM 2.26 (H) 0.44 - 1.00 mg/dL Final  04/05/2016 02:35 AM 1.65 (H) 0.44 - 1.00 mg/dL Final  04/04/2016 10:55 AM 1.56 (H) 0.44 - 1.00 mg/dL Final  03/10/2016 04:29 AM 1.79 (H) 0.44 - 1.00 mg/dL Final  03/09/2016 04:23 AM 2.47 (H) 0.44 - 1.00 mg/dL Final  03/08/2016 06:21 AM 3.60 (H) 0.44 - 1.00 mg/dL Final  03/08/2016 12:29 AM 3.79 (H) 0.44 - 1.00 mg/dL Final  10/11/2015 05:59 AM 1.64  (H) 0.44 - 1.00 mg/dL Final  10/10/2015 07:44 PM 1.88 (H) 0.44 - 1.00 mg/dL Final  01/21/2015 05:05 AM 1.27 (H) 0.50 - 1.10 mg/dL Final  01/20/2015 03:40 AM 1.23 (H) 0.50 - 1.10 mg/dL Final  01/19/2015 06:08 AM 1.50 (H) 0.50 - 1.10 mg/dL Final  01/18/2015 11:43 AM 1.47 (H) 0.50 - 1.10 mg/dL Final  01/17/2015 03:05 AM 1.55 (H) 0.50 - 1.10 mg/dL Final  01/16/2015 05:45 AM 1.46 (H) 0.50 - 1.10 mg/dL Final  01/15/2015 03:46 AM 1.66 (H) 0.50 - 1.10 mg/dL Final  01/14/2015 10:05 AM 1.40 (H) 0.50 - 1.10 mg/dL Final  01/14/2015 12:00 AM 1.36 (H) 0.50 - 1.10 mg/dL Final  10/02/2014 09:23 PM 2.80 (H) 0.50 - 1.10 mg/dL Final  10/14/2010 10:10 PM 1.00 0.4 - 1.2 mg/dL Final  11/18/2009 04:30 AM 0.95 0.4 - 1.2 mg/dL Final  11/17/2009 08:28 PM 1.24 (H) 0.4 - 1.2 mg/dL Final  11/17/2009 03:45 AM 1.00 0.4 - 1.2 mg/dL Final  11/16/2009 08:00 AM 1.34 (H) 0.4 - 1.2 mg/dL Final  11/15/2009 09:15 PM 1.17 0.4 - 1.2 mg/dL Final  10/03/2009 03:56 AM 1.03 0.4 - 1.2 mg/dL Final  10/02/2009 04:35 PM 0.72 0.4 - 1.2 mg/dL Final  06/27/2009 04:50 AM 0.81 0.4 - 1.2 mg/dL Final  06/24/2009 05:30 PM 0.96 0.4 -  1.2 mg/dL Final  06/24/2009 10:50 AM 0.93 0.4 - 1.2 mg/dL Final    PMH:   Past Medical History:  Diagnosis Date  . Arthritis    "both knees" (01/14/2015)  . Asthma   . Chronic combined systolic and diastolic CHF (congestive heart failure) ([place])   . CKD (chronic kidney disease), stage III   . COPD (chronic obstructive pulmonary disease) ([place])   . Degenerative joint disease of both lower legs   . Edema    Lower extremity  . Former smoker   . Gout    hx in "both knees" (01/14/2015)  . H. pylori infection 01/03/2014   +breath test.    . High cholesterol   . Hypertension   . Morbid obesity ([place])   . NICM (nonischemic cardiomyopathy) ([place])    a. LHC 01/2015: normal cors, EF 45-50%. b. EF 50-55% in 08/2016.  [name] Nonischemic cardiomyopathy ([place])   . Normal coronary arteries   . OSA (obstructive sleep apnea)     "never sent CPAP out" (01/14/2015)  . Paroxysmal atrial fibrillation ([facility])   . Pneumonia X 2  . Pulmonary hypertension   . Respiratory failure ([place])   . Rheumatoid arthritis ([place])   . Stroke [facility])     PSH:   Past Surgical History:  Procedure Laterality Date  . ANGIOGRAM/LV (CONGENITAL)  2007  . BREATH TEK H PYLORI N/A 12/31/2013   Procedure: [place];  Surgeon: Gayland Curry, MD;  Location: [facility];  Service: General;  Laterality: N/A;  . CORNEAL TRANSPLANT Right ~ 2010  . CORONARY ANGIOPLASTY WITH STENT PLACEMENT  11/04/2005   "1"  . DOPPLER ECHOCARDIOGRAPHY     2 D   EF of 45%  . EYE SURGERY    . LEFT HEART CATHETERIZATION WITH CORONARY ANGIOGRAM N/A 01/20/2015   Procedure: LEFT HEART CATHETERIZATION WITH CORONARY ANGIOGRAM;  Surgeon: Lorretta Harp, MD;  Location: [facility] CATH LAB;  Service: Cardiovascular;  Laterality: N/A;  . sleep study  2011  . stress myocardial dipyridamole perfusion    . TUBAL LIGATION  1982  . venous duplex ultrasound  2013    Allergies:  Allergies  Allergen Reactions  . Penicillins Hives, Itching, Swelling and Other (See Comments)    Reaction:  Tongue swelling Has patient had a PCN reaction causing immediate rash, facial/tongue/throat swelling, SOB or lightheadedness with hypotension: Yes Has patient had a PCN reaction causing severe rash involving mucus membranes or skin necrosis: No Has patient had a PCN reaction that required hospitalization No Has patient had a PCN reaction occurring within the last 10 years: No If all of the above answers are "NO", then may proceed with Cephalosporin use.  . Citrus Hives    Medications:   Prior to Admission medications   Medication Sig Start Date End Date Taking? Authorizing Provider  albuterol (PROVENTIL HFA;VENTOLIN HFA) 108 (90 Base) MCG/ACT inhaler Inhale 2 puffs into the lungs every 6 (six) hours as needed for wheezing or shortness of breath. 10/24/15  Yes Deneise Lever, MD  carvedilol  (COREG) 25 MG tablet Take 50 mg by mouth 2 (two) times daily with a meal.   Yes Historical Provider, MD  diclofenac sodium (VOLTAREN) 1 % GEL Apply 4 g topically 4 (four) times daily as needed (for pain).   Yes Historical Provider, MD  diltiazem (DILACOR XR) 180 MG 24 hr capsule Take 180 mg by mouth daily.   Yes Historical Provider, MD  docusate sodium (COLACE) 100 MG capsule Take  1 capsule (100 mg total) by mouth 2 (two) times daily. 09/14/16  Yes Eugenie Filler, MD  enoxaparin (LOVENOX) 40 MG/0.4ML injection Inject 0.4 mLs (40 mg total) into the skin daily. 09/14/16  Yes Eugenie Filler, MD  furosemide (LASIX) 80 MG tablet Take 80 mg by mouth 2 (two) times daily.   Yes Historical Provider, MD  gabapentin (NEURONTIN) 300 MG capsule Take 1 capsule (300 mg total) by mouth at bedtime. 08/31/16  Yes Janece Canterbury, MD  Multiple Vitamin (MULTIVITAMIN WITH MINERALS) TABS tablet Take 1 tablet by mouth daily.   Yes Historical Provider, MD  omega-3 acid ethyl esters (LOVAZA) 1 g capsule Take 1 g by mouth 2 (two) times daily.   Yes Historical Provider, MD  oxyCODONE (OXY IR/ROXICODONE) 5 MG immediate release tablet Take 1 tablet (5 mg total) by mouth every 6 (six) hours as needed for breakthrough pain. 09/14/16  Yes Eugenie Filler, MD  OXYGEN Inhale 3 L into the lungs continuous.   Yes Historical Provider, MD  polyethylene glycol (MIRALAX / GLYCOLAX) packet Take 17 g by mouth 2 (two) times daily. 09/14/16  Yes Eugenie Filler, MD  propafenone (RYTHMOL) 150 MG tablet Take 150 mg by mouth every 8 (eight) hours.   Yes Historical Provider, MD  torsemide (DEMADEX) 20 MG tablet Take 20 mg by mouth daily. For 3 days only (12/9-12/11)   Yes Historical Provider, MD  TUBERCULIN PPD ID Inject 0.1 Units/mL into the skin every 7 (seven) days. 5 units/0.63ml  For TB Screening for 2 Administrations 09/14/16  Yes Historical Provider, MD    Inpatient medications:   Discontinued Meds:  There are no discontinued  medications.  Social History:  reports that she quit smoking about 7 years ago. Her smoking use included Cigarettes. She has a 11.55 pack-year smoking history. She has never used smokeless tobacco. She reports that she drinks about 4.8 oz of alcohol per week . She reports that she does not use drugs.  Family History:   Family History  Problem Relation Age of Onset  . Heart disease Mother   . Cancer Father     colon  . Hypertension Other     A comprehensive review of systems was negative except for: Constitutional: positive for fatigue and malaise Respiratory: positive for dyspnea on exertion Cardiovascular: positive for fatigue and lower extremity edema Genitourinary: positive for Decreased urination Weight change:   Intake/Output Summary (Last 24 hours) at 09/18/16 1549 Last data filed at 09/18/16 1500  Gross per 24 hour  Intake                0 ml  Output                0 ml  Net                0 ml   BP (!) 77/59   Pulse (!) 44   Temp 97.8 F (36.6 C) (Oral)   Resp 17   LMP 09/07/2011   SpO2 96%  Vitals:   09/18/16 1301 09/18/16 1330 09/18/16 1423 09/18/16 1430  BP:  102/82  (!) 135/111  Pulse: (!) 47 (!) 45 (!) 44   Resp:  15 16 17   Temp:      TempSrc:      SpO2: 97% 97% 94% 96%     General appearance: fatigued, no distress and morbidly obese Head: Normocephalic, without obvious abnormality, atraumatic Neck: no adenopathy and no carotid bruit Resp: poor air  movement bilaterally Cardio: no rub and bradycardic at 46 GI: obese, +BS, soft, NT, mild suprapubic fullness Extremities: edema 1+ edema Neuro:  +myoclonic jerking  Labs: Basic Metabolic Panel:  Recent Labs Lab 09/12/16 0540 09/13/16 0512 09/14/16 0604 09/18/16 1126  NA 133* 135 135 132*  K 4.6 4.4 4.5 5.6*  CL 88* 91* 92* 90*  CO2 33* 34* 33* 31  GLUCOSE 124* 96 106* 134*  BUN 88* 82* 85* 96*  CREATININE 2.68* 2.27* 2.14* 3.71*  ALBUMIN  --   --  3.5 2.8*  CALCIUM 8.8* 8.7* 9.1 8.9  PHOS   --   --  5.2*  --    Liver Function Tests:  Recent Labs Lab 09/14/16 0604 09/18/16 1126  AST  --  278*  ALT  --  243*  ALKPHOS  --  78  BILITOT  --  1.3*  PROT  --  6.2*  ALBUMIN 3.5 2.8*   No results for input(s): LIPASE, AMYLASE in the last 168 hours. No results for input(s): AMMONIA in the last 168 hours. CBC:  Recent Labs Lab 09/12/16 0540 09/13/16 0512 09/14/16 0604 09/18/16 1126  WBC 7.9 7.0 7.4 7.6  NEUTROABS  --   --   --  6.3  HGB 7.7* 8.0* 7.7* 7.3*  HCT 26.0* 27.2* 26.6* 25.1*  MCV 88.7 89.8 89.6 88.7  PLT 194 207 240 194   PT/INR: @LABRCNTIP (inr:5) Cardiac Enzymes: )No results for input(s): CKTOTAL, CKMB, CKMBINDEX, TROPONINI in the last 168 hours. CBG: No results for input(s): GLUCAP in the last 168 hours.  Iron Studies:  Recent Labs Lab 09/14/16 0604  IRON 40  TIBC 339    Xrays/Other Studies: Dg Chest Portable 1 View  Result Date: 09/18/2016 CLINICAL DATA:  Patient with chest pain and pressure. EXAM: PORTABLE CHEST 1 VIEW COMPARISON:  Chest radiograph 09/05/2016. FINDINGS: Monitoring leads overlie the patient. Stable cardiomegaly. Minimal heterogeneous opacities lung bases bilaterally. No pleural effusion or pneumothorax. IMPRESSION: Cardiomegaly.  Basilar opacities favored to represent atelectasis. Electronically Signed   By: Lovey Newcomer M.D.   On: 09/18/2016 12:12     Assessment/Plan: 1.  AKI/CKD- in setting of BOO (bladder scan with >400cc of PVR), hypotension, and decompensated CHF.  BP too low for diuretics.  Contacted Dr. Aggie Moats to consult Cardiology due to bradycardia and hypotension and recommend transfer to ICU and PCCM consult.  She may require CVVHD if her hyperkalemia and renal function do not improve.  No emergent indication for HD at this time and too unstable for conventional HD.   2. Bradycardia and Hypotension- has h/o A fib.  Was on carvedilol 50mg  bid hold and consult Cardiology to evaluate 1. May benefit from glucagon IV and/or  dopamine. 2. Will need central line access. 3. Consult PCCM.  3. Myoclonic jerking- likely due to neurontin toxicity in setting of AKI/CKD.  Hold neurontin. 4. BOO- place foley catheter. 5. Anemia of chronic disease with possible acute aspect- cont to follow but may need transfusion if continues to drop. 6. Abnormal LFT's- ?early septic shock. Will check hepatitis panel and may need imaging. 7.  OSA- recommend PCCM consult 8. Disposition- pt is unstable at this time and recommend transfer to ICU with Cardiology and PCCM consult.    Governor Rooks Paige Marshall 09/18/2016, 3:49 PM

## 2016-09-18 NOTE — Progress Notes (Signed)
Pharmacy Antibiotic Note Paige Marshall is a 59 y.o. female admitted on 09/18/2016 with pneumonia.  Pharmacy has been consulted for Azactam and vancomycin dosing.  Plan: 1. Vancomycin 2000 IV every 48 hours.  Goal trough 15-20 mcg/mL.  2. Azactam 1 gram IV every 8 hours 3. PCN allergy noted but has tolerated Cefepime earlier this year without any incidence so reasonable to change Azactam to Cefepime for further treatment  4. Follow up renal plans and adjust abx as needed    Height: 5\' 7"  (170.2 cm) Weight: (!) 356 lb 7.7 oz (161.7 kg) IBW/kg (Calculated) : 61.6  Temp (24hrs), Avg:97.8 F (36.6 C), Min:97.7 F (36.5 C), Max:97.8 F (36.6 C)   Recent Labs Lab 09/12/16 0540 09/13/16 0512 09/14/16 0604 09/18/16 1126  WBC 7.9 7.0 7.4 7.6  CREATININE 2.68* 2.27* 2.14* 3.71*    Estimated Creatinine Clearance: 26.5 mL/min (by C-G formula based on SCr of 3.71 mg/dL (H)).    Allergies  Allergen Reactions  . Penicillins Hives, Itching, Swelling and Other (See Comments)    Reaction:  Tongue swelling Has patient had a PCN reaction causing immediate rash, facial/tongue/throat swelling, SOB or lightheadedness with hypotension: Yes Has patient had a PCN reaction causing severe rash involving mucus membranes or skin necrosis: No Has patient had a PCN reaction that required hospitalization No Has patient had a PCN reaction occurring within the last 10 years: No If all of the above answers are "NO", then may proceed with Cephalosporin use.  . Citrus Hives    Antimicrobials this admission: 12/9 Azactam  >>  12/9 vancomycin  >>   Dose adjustments this admission: n/a  Microbiology results: 12/9 MRSA PCR: negative   Thank you for allowing pharmacy to be a part of this patient's care.  Vincenza Hews, PharmD, BCPS 09/18/2016, 7:46 PM Pager: [phone]

## 2016-09-18 NOTE — Consult Note (Signed)
Admit date: 09/18/2016 Referring Physician  Dr. Aggie Moats Primary Physician  Dr. Juluis Mire Primary Cardiologist  Dr. Gwenlyn Found Reason for Consultation  bradycardia  HPI: Paige Marshall is a 60 y.o. 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  She has a history of LHC 01/2015: normal cors, EF 45-50%. During that admission IM d/w cardiology who agreed to start Eliquis but at f/u with Dr. Gwenlyn Found in 04/2015 this was no longer on med list. Cardiology office note indicates h/o excess bleeding. The patient statesd she didnt know anything about this and did not recall being on a blood thinner.  She has had several hospitalizations for asthma exacerbations, CHF, and AKI on CKD (up to 3.7 in 02/2016). 2D Echo 08/31/16: mild LVH, EF 50-55%, severely dilated RV/RA, PASP 45mmHg.   She was was admitted to  [facility] with syncope last month.  LE duplex neg for DVT at that time. Ortho films revealed oblique minimally displaced fracture through the distal left fibula; moderate suprapatellar effusion with no visualized fracture; CXR with stable cardiomegaly and no consolidation/effusion. Trop neg x 2, Hgb 10.4->9.3 (baseline 8-9), D-dimer 1.63, Cr 2.26->2.4 (BUN 83). Weight 341->329lb (baseline wt 342lb, DC wt 351 in 03/2016).  Unable to a CT angio due to CKD and patient too claustrophobic for VQ scan. Telemetry shows occasional runs of irregularity - occasionally felt to be PVCs, otherwise possible 3-5 beats NSVT vs AF with abberrancy. Cardiology was consulted for syncope which was felt to be orthostatic in etiology and 30 day heart monitor was ordered. She was discharged to Rehab on 12/5.    This am she presented to the ER due to  swelling in her hands and legs.  She noted increased SOB associated with cough with yellow sputum.  EMS was called and in ER Cxray showed basilar opacities and O2 sat was in the low 80's on 3L [place].  Trop negative.  She denied any chest pain.  Oxygen requirements increased to 5L.  BNP elevated at 991 (665 on last admit) and creatinine up to 3.71 as well as increased LFTs at 278 AST and 243 ALT and Hbg down to 7.7.  Noted to be bradycardic in the mid 40's to 50's on Coreg and CCB.  Cardiology is now asked to evaluate for bradycardia and hypotension.    PMH:   Past Medical History:  Diagnosis Date  . Arthritis    "both knees" (01/14/2015)  . Asthma   . Chronic combined systolic and diastolic CHF (congestive heart failure) ([place])   . CKD (chronic kidney disease), stage III   . COPD (chronic obstructive pulmonary disease) ([place])   . Degenerative joint disease of both lower legs   . Edema    Lower extremity  . Former smoker   . Gout    hx in "both knees" (01/14/2015)  . H. pylori infection 01/03/2014   +breath test.    . High cholesterol   . Hypertension   . Morbid obesity ([place])   . NICM (nonischemic cardiomyopathy) ([place])    a. LHC 01/2015: normal cors, EF 45-50%. b. EF 50-55% in 08/2016.  [name] Nonischemic cardiomyopathy ([place])   . Normal coronary arteries   . OSA (obstructive sleep apnea)    "never sent CPAP out" (01/14/2015)  . Paroxysmal  atrial fibrillation ([place])   . Pneumonia X 2  . Pulmonary hypertension   . Respiratory failure ([place])   . Rheumatoid arthritis ([place])   . Stroke [facility])      PSH:   Past Surgical History:  Procedure Laterality Date  . ANGIOGRAM/LV (CONGENITAL)  2007  . BREATH TEK H PYLORI N/A 12/31/2013   Procedure: [place];  Surgeon: Gayland Curry, MD;  Location: [facility];  Service: General;  Laterality: N/A;  . CORNEAL TRANSPLANT Right ~ 2010  . CORONARY ANGIOPLASTY WITH STENT PLACEMENT  11/04/2005   "1"  . DOPPLER ECHOCARDIOGRAPHY     2 D   EF of 45%  . EYE  SURGERY    . LEFT HEART CATHETERIZATION WITH CORONARY ANGIOGRAM N/A 01/20/2015   Procedure: LEFT HEART CATHETERIZATION WITH CORONARY ANGIOGRAM;  Surgeon: Lorretta Harp, MD;  Location: [facility] CATH LAB;  Service: Cardiovascular;  Laterality: N/A;  . sleep study  2011  . stress myocardial dipyridamole perfusion    . TUBAL LIGATION  1982  . venous duplex ultrasound  2013    Allergies:  Penicillins and Citrus Prior to Admit Meds:   Prescriptions Prior to Admission  Medication Sig Dispense Refill Last Dose  . albuterol (PROVENTIL HFA;VENTOLIN HFA) 108 (90 Base) MCG/ACT inhaler Inhale 2 puffs into the lungs every 6 (six) hours as needed for wheezing or shortness of breath. 1 Inhaler 11 prn  . carvedilol (COREG) 25 MG tablet Take 50 mg by mouth 2 (two) times daily with a meal.   09/18/2016 at 0800  . diclofenac sodium (VOLTAREN) 1 % GEL Apply 4 g topically 4 (four) times daily as needed (for pain).   prn  . diltiazem (DILACOR XR) 180 MG 24 hr capsule Take 180 mg by mouth daily.   09/18/2016 at Unknown time  . docusate sodium (COLACE) 100 MG capsule Take 1 capsule (100 mg total) by mouth 2 (two) times daily. 10 capsule 0 09/18/2016 at Unknown time  . enoxaparin (LOVENOX) 40 MG/0.4ML injection Inject 0.4 mLs (40 mg total) into the skin daily. 0 Syringe  09/18/2016 at Unknown time  . furosemide (LASIX) 80 MG tablet Take 80 mg by mouth 2 (two) times daily.   09/18/2016 at Unknown time  . gabapentin (NEURONTIN) 300 MG capsule Take 1 capsule (300 mg total) by mouth at bedtime. 30 capsule 0 09/17/2016 at Unknown time  . Multiple Vitamin (MULTIVITAMIN WITH MINERALS) TABS tablet Take 1 tablet by mouth daily.   09/18/2016 at Unknown time  . omega-3 acid ethyl esters (LOVAZA) 1 g capsule Take 1 g by mouth 2 (two) times daily.   09/18/2016 at Unknown time  . oxyCODONE (OXY IR/ROXICODONE) 5 MG immediate release tablet Take 1 tablet (5 mg total) by mouth every 6 (six) hours as needed for breakthrough pain. 10 tablet 0  09/17/2016 at Unknown time  . OXYGEN Inhale 3 L into the lungs continuous.   09/18/2016 at Unknown time  . polyethylene glycol (MIRALAX / GLYCOLAX) packet Take 17 g by mouth 2 (two) times daily. 14 each 0 09/18/2016 at Unknown time  . propafenone (RYTHMOL) 150 MG tablet Take 150 mg by mouth every 8 (eight) hours.   09/18/2016 at Unknown time  . torsemide (DEMADEX) 20 MG tablet Take 20 mg by mouth daily. For 3 days only (12/9-12/11)   09/18/2016 at Unknown time  . TUBERCULIN PPD ID Inject 0.1 Units/mL into the skin every 7 (seven) days. 5 units/0.59ml  For TB Screening for 2  Administrations   09/14/2016   Fam HX:    Family History  Problem Relation Age of Onset  . Heart disease Mother   . Cancer Father     colon  . Hypertension Other    Social HX:    Social History   Social History  . Marital status: Single    Spouse name: N/A  . Number of children: N/A  . Years of education: N/A   Occupational History  . Not on file.   Social History Main Topics  . Smoking status: Former Smoker    Packs/day: 0.33    Years: 35.00    Types: Cigarettes    Quit date: 10/11/2008  . Smokeless tobacco: Never Used  . Alcohol use 4.8 oz/week    8 Cans of beer per week     Comment: 01/14/2015 "~ 4, 24oz cans beer/wk"  . Drug use: No  . Sexual activity: No   Other Topics Concern  . Not on file   Social History Narrative  . No narrative on file     ROS:  All 11 ROS were addressed and are negative except what is stated in the HPI  Physical Exam: Blood pressure (!) 77/59, pulse (!) 44, temperature 97.8 F (36.6 C), temperature source Oral, resp. rate 17, last menstrual period 09/07/2011, SpO2 96 %.    General: Well developed, well nourished, Morbidly obese female in mild acute distress secondary to SOB Head: Eyes PERRLA, No xanthomas.   Normal cephalic and atramatic  Lungs:   Decreased at bases Heart:   HRRR S1 S2 Pulses are 2+ & equal.            No carotid bruit. No JVD.  No abdominal bruits. No  femoral bruits. Abdomen: Bowel sounds are positive, abdomen soft and non-tender without masses or                  Hernia's noted. Msk:  Back normal, normal gait. Normal strength and tone for age. Extremities:   Pitting pedal edema bilaterally Neuro: Alert and oriented X 3. Psych:  Good affect, responds appropriately    Labs:   Lab Results  Component Value Date   WBC 7.6 09/18/2016   HGB 7.7 (L) 09/18/2016   HCT 26.3 (L) 09/18/2016   MCV 88.7 09/18/2016   PLT 194 09/18/2016    Recent Labs Lab 09/18/16 1126  NA 132*  K 5.6*  CL 90*  CO2 31  BUN 96*  CREATININE 3.71*  CALCIUM 8.9  PROT 6.2*  BILITOT 1.3*  ALKPHOS 78  ALT 243*  AST 278*  GLUCOSE 134*   No results found for: PTT Lab Results  Component Value Date   INR 1.08 08/30/2016   INR 1.11 01/20/2015   INR 1.0 06/24/2009   Lab Results  Component Value Date   CKTOTAL 56 11/16/2009   CKMB 1.5 11/16/2009   TROPONINI 0.04 (H) 04/05/2016     Lab Results  Component Value Date   CHOL 167 01/15/2015   CHOL 190 11/19/2013   CHOL  10/02/2009    191        ATP III CLASSIFICATION:  <200     mg/dL   Desirable  200-239  mg/dL   Borderline High  >=240    mg/dL   High          Lab Results  Component Value Date   HDL 35 (L) 01/15/2015   HDL 53 11/19/2013   HDL 64 10/02/2009   Lab  Results  Component Value Date   LDLCALC 93 01/15/2015   [place] 115 (H) 11/19/2013   [place]  10/02/2009    94        Total Cholesterol/HDL:CHD Risk Coronary Heart Disease Risk Table                     Men   Women  1/2 Average Risk   3.4   3.3  Average Risk       5.0   4.4  2 X Average Risk   9.6   7.1  3 X Average Risk  23.4   11.0        Use the calculated Patient Ratio above and the CHD Risk Table to determine the patient's CHD Risk.        ATP III CLASSIFICATION (LDL):  <100     mg/dL   Optimal  100-129  mg/dL   Near or Above                    Optimal  130-159  mg/dL   Borderline  160-189  mg/dL   High  >190      mg/dL   Very High   Lab Results  Component Value Date   TRIG 194 (H) 01/15/2015   TRIG 108 11/19/2013   TRIG 163 (H) 10/02/2009   Lab Results  Component Value Date   CHOLHDL 4.8 01/15/2015   CHOLHDL 3.6 11/19/2013   CHOLHDL 3.0 10/02/2009   No results found for: LDLDIRECT    Radiology:  Dg Chest Portable 1 View  Result Date: 09/18/2016 CLINICAL DATA:  Patient with chest pain and pressure. EXAM: PORTABLE CHEST 1 VIEW COMPARISON:  Chest radiograph 09/05/2016. FINDINGS: Monitoring leads overlie the patient. Stable cardiomegaly. Minimal heterogeneous opacities lung bases bilaterally. No pleural effusion or pneumothorax. IMPRESSION: Cardiomegaly.  Basilar opacities favored to represent atelectasis. Electronically Signed   By: Lovey Newcomer M.D.   On: 09/18/2016 12:12    EKG:  Sinus bradycardia at 46bpm with no ST changes  ASSESSMENT/PLAN:    61 y.o. 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  1.  Bradycardia - suspect she has reduced clearance of her BB due to worsening renal function.  Nephrology has written fro glucagon and will give atropine.  Stop BB and CCB.  Stop propafenone.  Start dopamine and hypotension.    2.  PAF - currently sinus bradycardia.  She has been deemed not to be a candidate for anticoagulation due to bleeding in the past and is very anemic today.  Her CHADS2VASC score is 3.   3.  Acute respiratory failure - she appears volume overloaded with LE edema and increased oxygen demands.  She has been in rehab and has been sedentary since her Leg fx.  She is at high risk for acute  PE with morbid obesity, sedentary state and now with worsening hypoxia and hypotension need to consider acute PE.  Cannot do Chest CT angio due to acute on chronic renal insuff.  Recommend stat VQ scan.  Will check 2D echo to look for signs of Right heart strain.  If she has a PE will need to consider IVC  as she has been deemed not to be a candidate for anticoagulation and she is very anemic.   4.  Acute on chronic combined CHF - BNP elevated with LE edema.  IF diuresis as BP tolerates and with guidance of nephrology.   Repeat echo to reassess LVF>  5.  Acute on CKD ? Etiology - per nephrology  6.  Worsening anemia ? Etiology - per IM   Fransico Him, MD  09/18/2016  4:50 PM

## 2016-09-18 NOTE — ED Provider Notes (Signed)
[facility] DEPT Provider Note   CSN: [id number] Arrival date & time: 09/18/16  1109     History   Chief Complaint Chief Complaint  Patient presents with  . Shortness of Breath    HPI Paige Marshall is a 60 y.o. female.  The history is provided by the patient.  Shortness of Breath  This is a recurrent problem. The average episode lasts 2 days. The problem occurs continuously.The current episode started 2 days ago. The problem has been gradually worsening. Associated symptoms include wheezing, orthopnea and leg swelling. Pertinent negatives include no fever and no vomiting. Associated symptoms comments: Decreased UOP. She has tried beta-agonist inhalers for the symptoms. The treatment provided no relief. She has had prior hospitalizations. She has had prior ED visits. Associated medical issues include COPD and heart failure. Associated medical issues comments: CKD.    Past Medical History:  Diagnosis Date  . Arthritis    "both knees" (01/14/2015)  . Asthma   . Chronic combined systolic and diastolic CHF (congestive heart failure) ([place])   . CKD (chronic kidney disease), stage III   . COPD (chronic obstructive pulmonary disease) ([place])   . Degenerative joint disease of both lower legs   . Edema    Lower extremity  . Former smoker   . Gout    hx in "both knees" (01/14/2015)  . H. pylori infection 01/03/2014   +breath test.    . High cholesterol   . Hypertension   . Morbid obesity ([place])   . NICM (nonischemic cardiomyopathy) ([place])    a. LHC 01/2015: normal cors, EF 45-50%. b. EF 50-55% in 08/2016.  [name] Nonischemic cardiomyopathy ([place])   . Normal coronary arteries   . OSA (obstructive sleep apnea)    "never sent CPAP out" (01/14/2015)  . Paroxysmal atrial fibrillation (HCC)   . Pneumonia X 2  . Pulmonary hypertension   . Respiratory failure ([place])   . Rheumatoid arthritis ([place])   . Stroke [facility])     Patient Active Problem List   Diagnosis Date Noted  . Acute respiratory  failure with hypoxemia ([place]) 09/18/2016  . Anemia 09/06/2016  . Left fibular fracture 09/06/2016  . SOB (shortness of breath)   . Acute kidney injury superimposed on CKD ([place]) 08/31/2016  . NICM (nonischemic cardiomyopathy) ([place]) 08/31/2016  . Pulmonary hypertension 08/31/2016  . Right heart failure, NYHA class 3 08/31/2016  . Acute on chronic diastolic CHF (congestive heart failure) ([place]) 08/29/2016  . 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 Syncope   . AKI (acute kidney injury) ([place]) 03/08/2016  . Chronic respiratory failure with hypoxia ([place]) 10/24/2015  . COPD mixed type ([place]) 10/10/2015  . 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 Uncontrolled hypertension   . Acute on chronic respiratory failure with hypoxia ([place]) 01/14/2015  . Obesity hypoventilation syndrome ([place]) 01/14/2015  . Obstructive sleep apnea 09/28/2013  . Morbid obesity ([place]) 09/28/2013  . PAF (paroxysmal atrial fibrillation) ([place]) 01/09/2010  . Dyslipidemia 10/17/2006  . Essential hypertension 10/17/2006  . VENTRICULAR HYPERTROPHY, LEFT 10/17/2006  . DYSFUNCTIONAL UTERINE BLEEDING 10/17/2006    Past Surgical History:  Procedure Laterality Date  . ANGIOGRAM/LV (CONGENITAL)  2007  . BREATH TEK H PYLORI N/A 12/31/2013   Procedure: [place];  Surgeon: Gayland Curry, MD;  Location: [facility];  Service: General;  Laterality: N/A;  . CORNEAL TRANSPLANT Right ~ 2010  .  CORONARY ANGIOPLASTY WITH STENT PLACEMENT  11/04/2005   "1"  . DOPPLER ECHOCARDIOGRAPHY     2 D   EF of 45%  . EYE SURGERY    . LEFT HEART CATHETERIZATION WITH CORONARY ANGIOGRAM N/A 01/20/2015   Procedure: LEFT HEART CATHETERIZATION WITH CORONARY ANGIOGRAM;  Surgeon: Lorretta Harp, MD;  Location: [facility] CATH LAB;  Service: Cardiovascular;  Laterality: N/A;  . sleep study  2011  . stress myocardial dipyridamole perfusion    . TUBAL LIGATION   1982  . venous duplex ultrasound  2013    OB History    No data available       Home Medications    Prior to Admission medications   Medication Sig Start Date End Date Taking? Authorizing Provider  albuterol (PROVENTIL HFA;VENTOLIN HFA) 108 (90 Base) MCG/ACT inhaler Inhale 2 puffs into the lungs every 6 (six) hours as needed for wheezing or shortness of breath. 10/24/15  Yes Deneise Lever, MD  carvedilol (COREG) 25 MG tablet Take 50 mg by mouth 2 (two) times daily with a meal.   Yes Historical Provider, MD  diclofenac sodium (VOLTAREN) 1 % GEL Apply 4 g topically 4 (four) times daily as needed (for pain).   Yes Historical Provider, MD  diltiazem (DILACOR XR) 180 MG 24 hr capsule Take 180 mg by mouth daily.   Yes Historical Provider, MD  docusate sodium (COLACE) 100 MG capsule Take 1 capsule (100 mg total) by mouth 2 (two) times daily. 09/14/16  Yes Eugenie Filler, MD  enoxaparin (LOVENOX) 40 MG/0.4ML injection Inject 0.4 mLs (40 mg total) into the skin daily. 09/14/16  Yes Eugenie Filler, MD  furosemide (LASIX) 80 MG tablet Take 80 mg by mouth 2 (two) times daily.   Yes Historical Provider, MD  gabapentin (NEURONTIN) 300 MG capsule Take 1 capsule (300 mg total) by mouth at bedtime. 08/31/16  Yes Janece Canterbury, MD  Multiple Vitamin (MULTIVITAMIN WITH MINERALS) TABS tablet Take 1 tablet by mouth daily.   Yes Historical Provider, MD  omega-3 acid ethyl esters (LOVAZA) 1 g capsule Take 1 g by mouth 2 (two) times daily.   Yes Historical Provider, MD  oxyCODONE (OXY IR/ROXICODONE) 5 MG immediate release tablet Take 1 tablet (5 mg total) by mouth every 6 (six) hours as needed for breakthrough pain. 09/14/16  Yes Eugenie Filler, MD  OXYGEN Inhale 3 L into the lungs continuous.   Yes Historical Provider, MD  polyethylene glycol (MIRALAX / GLYCOLAX) packet Take 17 g by mouth 2 (two) times daily. 09/14/16  Yes Eugenie Filler, MD  propafenone (RYTHMOL) 150 MG tablet Take 150 mg by mouth  every 8 (eight) hours.   Yes Historical Provider, MD  torsemide (DEMADEX) 20 MG tablet Take 20 mg by mouth daily. For 3 days only (12/9-12/11)   Yes Historical Provider, MD  TUBERCULIN PPD ID Inject 0.1 Units/mL into the skin every 7 (seven) days. 5 units/0.72ml  For TB Screening for 2 Administrations 09/14/16  Yes Historical Provider, MD    Family History Family History  Problem Relation Age of Onset  . Heart disease Mother   . Cancer Father     colon  . Hypertension Other     Social History Social History  Substance Use Topics  . Smoking status: Former Smoker    Packs/day: 0.33    Years: 35.00    Types: Cigarettes    Quit date: 10/11/2008  . Smokeless tobacco: Never Used  . Alcohol use 4.8  oz/week    8 Cans of beer per week     Comment: 01/14/2015 "~ 4, 24oz cans beer/wk"     Allergies   Penicillins and Citrus   Review of Systems Review of Systems  Constitutional: Negative for fever.  Respiratory: Positive for shortness of breath and wheezing.   Cardiovascular: Positive for orthopnea and leg swelling.  Gastrointestinal: Negative for vomiting.  All other systems reviewed and are negative.    Physical Exam Updated Vital Signs BP 96/80   Pulse (!) 47   Temp 97.7 F (36.5 C) (Oral)   Resp 12   LMP 09/07/2011   SpO2 97%   Physical Exam  Constitutional: She is oriented to person, place, and time. No distress.  Chronically ill appearing, morbidly obese  HENT:  Head: Normocephalic.  Nose: Nose normal.  Eyes: Conjunctivae are normal.  Neck: Neck supple. No tracheal deviation present.  Cardiovascular: Normal rate and regular rhythm.   Pulmonary/Chest: Effort normal. No respiratory distress. She has wheezes (diffuse expiratory).  Abdominal: Soft. She exhibits no distension. There is no tenderness.  Musculoskeletal:  B/l LE edema with left lower leg tenderness  Neurological: She is alert and oriented to person, place, and time.  Skin: Skin is warm and dry.    Psychiatric: She has a normal mood and affect.     ED Treatments / Results  Labs (all labs ordered are listed, but only abnormal results are displayed) Labs Reviewed  CBC WITH DIFFERENTIAL/PLATELET - Abnormal; Notable for the following:       Result Value   RBC 2.83 (*)    Hemoglobin 7.3 (*)    HCT 25.1 (*)    MCH 25.8 (*)    MCHC 29.1 (*)    RDW 19.9 (*)    All other components within normal limits  BRAIN NATRIURETIC PEPTIDE - Abnormal; Notable for the following:    B Natriuretic Peptide 991.0 (*)    All other components within normal limits  COMPREHENSIVE METABOLIC PANEL - Abnormal; Notable for the following:    Sodium 132 (*)    Potassium 5.6 (*)    Chloride 90 (*)    Glucose, Bld 134 (*)    BUN 96 (*)    Creatinine, Ser 3.71 (*)    Total Protein 6.2 (*)    Albumin 2.8 (*)    AST 278 (*)    ALT 243 (*)    Total Bilirubin 1.3 (*)    GFR calc non Af Amer 12 (*)    GFR calc Af Amer 14 (*)    All other components within normal limits  I-STAT VENOUS BLOOD GAS, ED - Abnormal; Notable for the following:    pCO2, Ven 63.5 (*)    pO2, Ven 54.0 (*)    Bicarbonate 35.5 (*)    Acid-Base Excess 9.0 (*)    All other components within normal limits  I-STAT TROPOININ, ED    EKG  EKG Interpretation  Date/Time:  Saturday September 18 2016 11:09:57 EST Ventricular Rate:  51 PR Interval:    QRS Duration: 133 QT Interval:  419 QTC Calculation: 386 R Axis:   141 Text Interpretation:  Sinus rhythm Prolonged PR interval Nonspecific intraventricular conduction delay Borderline T abnormalities, lateral precordial leads New since previous tracing Confirmed by Ivy Puryear MD, Jerek Meulemans [phone]) on 09/18/2016 11:32:56 AM       Radiology Dg Chest Portable 1 View  Result Date: 09/18/2016 CLINICAL DATA:  Patient with chest pain and pressure. EXAM: PORTABLE CHEST 1 VIEW  COMPARISON:  Chest radiograph 09/05/2016. FINDINGS: Monitoring leads overlie the patient. Stable cardiomegaly. Minimal  heterogeneous opacities lung bases bilaterally. No pleural effusion or pneumothorax. IMPRESSION: Cardiomegaly.  Basilar opacities favored to represent atelectasis. Electronically Signed   By: Lovey Newcomer M.D.   On: 09/18/2016 12:12    Procedures Procedures (including critical care time)  Medications Ordered in ED Medications  sodium chloride 0.9 % bolus 500 mL (not administered)  oxyCODONE (Oxy IR/ROXICODONE) immediate release tablet 5 mg (5 mg Oral Given 09/18/16 1200)     Initial Impression / Assessment and Plan / ED Course  I have reviewed the triage vital signs and the nursing notes.  Pertinent labs & imaging results that were available during my care of the patient were reviewed by me and considered in my medical decision making (see chart for details).  Clinical Course     60 year old female with history of CHF, COPD with OSA, and chronic kidney disease presents with complaint of decreased urine output over the last day. She had recent admission for CHF exacerbation and had AKI after overdiuresis. She is not clinically volume down but has signs of acute renal failure and developing hyperkalemia with concomitant respiratory failure. Will require further inpatient workup and management for these problems, started on gentle IVF rehydration to help correct. Not requiring respiratory support beyond oxygen at the moment but has potential for worsening. Hospitalist was consulted for stepdown admission and will see the patient in the emergency department.   Final Clinical Impressions(s) / ED Diagnoses   Final diagnoses:  Acute renal failure superimposed on chronic kidney disease, unspecified CKD stage, unspecified acute renal failure type (HCC)  Hyperkalemia  Congestive heart failure, unspecified congestive heart failure chronicity, unspecified congestive heart failure type ([place])  Acute respiratory failure with hypoxemia (HCC)  OSA (obstructive sleep apnea)  Transaminitis    New  Prescriptions Current Discharge Medication List       Leo Grosser, MD 09/18/16 1835

## 2016-09-18 NOTE — ED Triage Notes (Signed)
To room via EMS.  Pt from [facility], has been there since 09-13-16 for rehab broke left foot.  Onset last night shortness of breath.  Pt on home O2 @ 3L.  Onset 2 days swelling all over. No urine in 2 days.  Lungs with wheezing throught.  Pt finished neb tx that EMS started. Onset this morning cough.  Staff at [facility] got BP 67/42, 92% on O2 @ 3L, HR 55.

## 2016-09-18 NOTE — Progress Notes (Addendum)
RT Note:   Two RTs attempted to place arterial line in patient but were unsuccessful. RN and MD have been notified.

## 2016-09-18 NOTE — ED Notes (Signed)
Dr. Nyra Capes at bedside.  Floor RN called requesting bladder scan.  MD will put order in.

## 2016-09-18 NOTE — ED Notes (Signed)
Report attempted 

## 2016-09-18 NOTE — H&P (Signed)
[facility] History and Physical  Paige Marshall [id number] DOB: 09-22-1956 DOA: 09/18/2016  Referring physician:  PCP: Kerin Perna, NP   Chief Complaint: "I woke up this morning and I knew I had too much fluid."  HPI: Paige Marshall is a 59 y.o. female significant for asthma, COPD, CK D, gout, high cholesterol, high blood pressure, obstructive sleep apnea, paroxysmal A. Fib and recent discharge from [facility] on 12/5. Patient was discharged recently and has remained on her Lasix while in rehabilitation. She has been participating with physical therapy as much as possible. She had no prodrome. The morning she presented to the emergency room she woke up with swelling in her hands and legs and feet. She felt mildly short of breath and having coughing up yellow and white sputum. This prompted her to have EMS activated so she could be seen in the emergency room.  ED course: Portable chest x-ray shows possible basilar opacities. Creatinine. Patient's oxygen titrated up from baseline of 3-5 L. I-STAT troponin negative.  Review of Systems:  As per HPI otherwise 10 point review of systems negative.    Past Medical History:  Diagnosis Date  . Arthritis    "both knees" (01/14/2015)  . Asthma   . Chronic combined systolic and diastolic CHF (congestive heart failure) ([place])   . CKD (chronic kidney disease), stage III   . COPD (chronic obstructive pulmonary disease) ([place])   . Degenerative joint disease of both lower legs   . Edema    Lower extremity  . Former smoker   . Gout    hx in "both knees" (01/14/2015)  . H. pylori infection 01/03/2014   +breath test.    . High cholesterol   . Hypertension   . Morbid obesity ([place])   . NICM (nonischemic cardiomyopathy) ([place])    a. LHC 01/2015: normal cors, EF 45-50%. b. EF 50-55% in 08/2016.  [name] Nonischemic cardiomyopathy ([place])   . Normal coronary arteries   . OSA (obstructive sleep apnea)    "never sent CPAP out" (01/14/2015)    . Paroxysmal atrial fibrillation (HCC)   . Pneumonia X 2  . Pulmonary hypertension   . Respiratory failure ([place])   . Rheumatoid arthritis ([place])   . Stroke [facility])    Past Surgical History:  Procedure Laterality Date  . ANGIOGRAM/LV (CONGENITAL)  2007  . BREATH TEK H PYLORI N/A 12/31/2013   Procedure: [place];  Surgeon: Gayland Curry, MD;  Location: [facility];  Service: General;  Laterality: N/A;  . CORNEAL TRANSPLANT Right ~ 2010  . CORONARY ANGIOPLASTY WITH STENT PLACEMENT  11/04/2005   "1"  . DOPPLER ECHOCARDIOGRAPHY     2 D   EF of 45%  . EYE SURGERY    . LEFT HEART CATHETERIZATION WITH CORONARY ANGIOGRAM N/A 01/20/2015   Procedure: LEFT HEART CATHETERIZATION WITH CORONARY ANGIOGRAM;  Surgeon: Lorretta Harp, MD;  Location: [facility] CATH LAB;  Service: Cardiovascular;  Laterality: N/A;  . sleep study  2011  . stress myocardial dipyridamole perfusion    . TUBAL LIGATION  1982  . venous duplex ultrasound  2013   Social History:  reports that she quit smoking about 7 years ago. Her smoking use included Cigarettes. She has a 11.55 pack-year smoking history. She has never used smokeless tobacco. She reports that she drinks about 4.8 oz of alcohol per week . She reports that she does not use drugs.  Allergies  Allergen Reactions  . Penicillins Hives,  Itching, Swelling and Other (See Comments)    Reaction:  Tongue swelling Has patient had a PCN reaction causing immediate rash, facial/tongue/throat swelling, SOB or lightheadedness with hypotension: Yes Has patient had a PCN reaction causing severe rash involving mucus membranes or skin necrosis: No Has patient had a PCN reaction that required hospitalization No Has patient had a PCN reaction occurring within the last 10 years: No If all of the above answers are "NO", then may proceed with Cephalosporin use.  . Citrus Hives    Family History  Problem Relation Age of Onset  . Heart disease Mother   . Cancer Father      colon  . Hypertension Other      Prior to Admission medications   Medication Sig Start Date End Date Taking? Authorizing Provider  albuterol (PROVENTIL HFA;VENTOLIN HFA) 108 (90 Base) MCG/ACT inhaler Inhale 2 puffs into the lungs every 6 (six) hours as needed for wheezing or shortness of breath. 10/24/15  Yes Deneise Lever, MD  carvedilol (COREG) 25 MG tablet Take 50 mg by mouth 2 (two) times daily with a meal.   Yes Historical Provider, MD  diclofenac sodium (VOLTAREN) 1 % GEL Apply 4 g topically 4 (four) times daily as needed (for pain).   Yes Historical Provider, MD  diltiazem (DILACOR XR) 180 MG 24 hr capsule Take 180 mg by mouth daily.   Yes Historical Provider, MD  docusate sodium (COLACE) 100 MG capsule Take 1 capsule (100 mg total) by mouth 2 (two) times daily. 09/14/16  Yes Eugenie Filler, MD  enoxaparin (LOVENOX) 40 MG/0.4ML injection Inject 0.4 mLs (40 mg total) into the skin daily. 09/14/16  Yes Eugenie Filler, MD  furosemide (LASIX) 80 MG tablet Take 80 mg by mouth 2 (two) times daily.   Yes Historical Provider, MD  gabapentin (NEURONTIN) 300 MG capsule Take 1 capsule (300 mg total) by mouth at bedtime. 08/31/16  Yes Janece Canterbury, MD  Multiple Vitamin (MULTIVITAMIN WITH MINERALS) TABS tablet Take 1 tablet by mouth daily.   Yes Historical Provider, MD  omega-3 acid ethyl esters (LOVAZA) 1 g capsule Take 1 g by mouth 2 (two) times daily.   Yes Historical Provider, MD  oxyCODONE (OXY IR/ROXICODONE) 5 MG immediate release tablet Take 1 tablet (5 mg total) by mouth every 6 (six) hours as needed for breakthrough pain. 09/14/16  Yes Eugenie Filler, MD  OXYGEN Inhale 3 L into the lungs continuous.   Yes Historical Provider, MD  polyethylene glycol (MIRALAX / GLYCOLAX) packet Take 17 g by mouth 2 (two) times daily. 09/14/16  Yes Eugenie Filler, MD  propafenone (RYTHMOL) 150 MG tablet Take 150 mg by mouth every 8 (eight) hours.   Yes Historical Provider, MD  torsemide (DEMADEX) 20  MG tablet Take 20 mg by mouth daily. For 3 days only (12/9-12/11)   Yes Historical Provider, MD  TUBERCULIN PPD ID Inject 0.1 Units/mL into the skin every 7 (seven) days. 5 units/0.72ml  For TB Screening for 2 Administrations 09/14/16  Yes Historical Provider, MD   Physical Exam: Vitals:   09/18/16 1301 09/18/16 1330 09/18/16 1423 09/18/16 1430  BP:  102/82  (!) 135/111  Pulse: (!) 47 (!) 45 (!) 44   Resp:  15 16 17   Temp:      TempSrc:      SpO2: 97% 97% 94% 96%    Wt Readings from Last 3 Encounters:  09/14/16 (!) 173.3 kg (382 lb)  09/01/16 (!) 155 kg (  341 lb 11.4 oz)  05/07/16 (!) 155.1 kg (342 lb)    General:  Appears calm and comfortable Oriented 3, dozing intermittently,  Eyes:  PERRL, EOMI, normal lids, iris ENT:  grossly normal hearing, lips & tongue Neck:  no LAD, masses or thyromegaly Cardiovascular:  RRR, no m/r/g. No LE edema.  Respiratory:  Rales, increased work of breathing, 5 L nasal cannula Abdomen:  soft, ntnd Skin:  no rash or induration seen on limited exam Musculoskeletal:  grossly normal tone BUE/BLE; tender left lower leg Psychiatric:  grossly normal mood and affect, speech fluent and appropriate Neurologic:  CN 2-12 grossly intact, moves all extremities in coordinated fashion.          Labs on Admission:  Basic Metabolic Panel:  Recent Labs Lab 09/12/16 0540 09/13/16 0512 09/14/16 0604 09/18/16 1126  NA 133* 135 135 132*  K 4.6 4.4 4.5 5.6*  CL 88* 91* 92* 90*  CO2 33* 34* 33* 31  GLUCOSE 124* 96 106* 134*  BUN 88* 82* 85* 96*  CREATININE 2.68* 2.27* 2.14* 3.71*  CALCIUM 8.8* 8.7* 9.1 8.9  PHOS  --   --  5.2*  --    Liver Function Tests:  Recent Labs Lab 09/14/16 0604 09/18/16 1126  AST  --  278*  ALT  --  243*  ALKPHOS  --  78  BILITOT  --  1.3*  PROT  --  6.2*  ALBUMIN 3.5 2.8*   No results for input(s): LIPASE, AMYLASE in the last 168 hours. No results for input(s): AMMONIA in the last 168 hours. CBC:  Recent Labs Lab  09/12/16 0540 09/13/16 0512 09/14/16 0604 09/18/16 1126  WBC 7.9 7.0 7.4 7.6  NEUTROABS  --   --   --  6.3  HGB 7.7* 8.0* 7.7* 7.3*  HCT 26.0* 27.2* 26.6* 25.1*  MCV 88.7 89.8 89.6 88.7  PLT 194 207 240 194   Cardiac Enzymes: No results for input(s): CKTOTAL, CKMB, CKMBINDEX, TROPONINI in the last 168 hours.  BNP (last 3 results)  Recent Labs  08/28/16 2132 09/05/16 1555 09/18/16 1126  BNP 529.0* 665.3* 991.0*    ProBNP (last 3 results) No results for input(s): PROBNP in the last 8760 hours.   Serum creatinine: 3.71 mg/dL High 09/18/16 1126 Estimated creatinine clearance: 27.7 mL/min  CBG: No results for input(s): GLUCAP in the last 168 hours.  Radiological Exams on Admission: Dg Chest Portable 1 View  Result Date: 09/18/2016 CLINICAL DATA:  Patient with chest pain and pressure. EXAM: PORTABLE CHEST 1 VIEW COMPARISON:  Chest radiograph 09/05/2016. FINDINGS: Monitoring leads overlie the patient. Stable cardiomegaly. Minimal heterogeneous opacities lung bases bilaterally. No pleural effusion or pneumothorax. IMPRESSION: Cardiomegaly.  Basilar opacities favored to represent atelectasis. Electronically Signed   By: Lovey Newcomer M.D.   On: 09/18/2016 12:12    EKG: Independently reviewed. Ventricular rate 51, PR interval 180, QRS 133, QTC 386, sinus rhythm, borderline T-wave abnormalities in precordial leads -no acute ST changes when compared to November 27 EKG  Assessment/Plan Principal Problem:   Acute respiratory failure with hypoxemia (HCC) Active Problems:   Dyslipidemia   Obstructive sleep apnea   Uncontrolled hypertension   COPD mixed type (HCC)   Acute on chronic renal failure (HCC)   Anemia   Left fibular fracture   Acute on Chronic Resp Failure 3L at baseline, now on 5L RT consult Elevated BNP, CHF? BNP is higher than baseline and patient has puffy extremities Avoid diuresis aggressively but patient has evidence  of acute on chronic renal  failure Respiratory therapy consult When necessary BiPAP Recheck in the VBG, sleepy Echo 08/30/16 ejection fraction 50-55% Hold gabapentin On prior admission patient was not agreeable to CTA, she is now agreeable we'll see if renal function will allow for it is not we'll consider CT chest  Anemia Hemoglobin on last admission 7-7.8 Type and cross ordered Will discuss transfusion with patient based on symptoms and labs she may benefit from this we'll discuss with nephrology  Paroxysmal A. fib Chads 2 score 3-4 No anticoagulation due to history of excessive bleed Continue propafenone, coreg and diltiazem  AKI Baseline Cr 2.6, Cr on admit 3.7 BUN/Cr >20, likely prerenal 500cc of normal saline given in the emergency room, started before leaving floor Hydration overnight Checking magnesium and phosphorus Urine labs ordered to calculate fractional excretion of urea Strict I&O, oliguria? Bladder scan ordered Renal consult, patient was intravascular dry last time feel that she will likely benefit from fluid again possibly transfusion  HLD Cont Lovaza  Hypertension When necessary hydralazine 10 mg IV as needed for severe blood pressure  COPD Albuterol prn  OSA RT Consult Pt needs sleep study Due to recurrent hosp stays for resp issue, assisting her get in with sleep medicine early I feels is warrnated  Left fibular fracture Per Dr. Erlinda Hong from [facility], she is not a surgical candidate due to her chronic respiratory failure. Patient is unable to weight bear and must be in CAM walker for 4-6 weeks. She was recommended for SNF placement during last hospitalization, declined beforebut now accepts placement to SNF  Code Status: FULL  DVT Prophylaxis: SCD Family Communication: BF (surrogate decision maker) at bedside Disposition Plan: Pending Improvement   Update: Patient began to drop her blood pressure on the floor. Systolic blood pressure between 76 and 87. I was  called to the floor by consulting nephrologist. Patient hypotensive a monitor. Extremities seem well perfused. Some mild mental status impairment with memory compared to initial exam. Decision was made to consult critical care. Critical care call by nephrology. Patient given a bolus of fluid which caused rebound blood pressure. Foley placed. Defibrillator pads placed. Transfer order placed. Dopamine ordered. Patient transferred to ICU.   Elwin Mocha, MD Family Medicine [facility] [email or web address] Password [email or web address]

## 2016-09-18 NOTE — Progress Notes (Signed)
ANTICOAGULATION CONSULT NOTE - Initial Consult  Pharmacy Consult for Heparin Indication: pulmonary embolus, rule out  Allergies  Allergen Reactions  . Penicillins Hives, Itching, Swelling and Other (See Comments)    Reaction:  Tongue swelling Has patient had a PCN reaction causing immediate rash, facial/tongue/throat swelling, SOB or lightheadedness with hypotension: Yes Has patient had a PCN reaction causing severe rash involving mucus membranes or skin necrosis: No Has patient had a PCN reaction that required hospitalization No Has patient had a PCN reaction occurring within the last 10 years: No If all of the above answers are "NO", then may proceed with Cephalosporin use.  . Citrus Hives    Patient Measurements: Height: 5\' 7"  (170.2 cm) Weight: (!) 356 lb 7.7 oz (161.7 kg) IBW/kg (Calculated) : 61.6 Heparin Dosing Weight: 104 kg  Vital Signs: Temp: 97.8 F (36.6 C) (12/09 1430) Temp Source: Oral (12/09 1430) BP: 70/40 (12/09 1758) Pulse Rate: 47 (12/09 1758)  Labs:  Recent Labs  09/18/16 1126 09/18/16 1607  HGB 7.3* 7.7*  HCT 25.1* 26.3*  PLT 194  --   CREATININE 3.71*  --     Estimated Creatinine Clearance: 26.5 mL/min (by C-G formula based on SCr of 3.71 mg/dL (H)).   Medical History: Past Medical History:  Diagnosis Date  . Arthritis    "both knees" (01/14/2015)  . Asthma   . Chronic combined systolic and diastolic CHF (congestive heart failure) ([place])   . CKD (chronic kidney disease), stage III   . COPD (chronic obstructive pulmonary disease) ([place])   . Degenerative joint disease of both lower legs   . Edema    Lower extremity  . Former smoker   . Gout    hx in "both knees" (01/14/2015)  . H. pylori infection 01/03/2014   +breath test.    . High cholesterol   . Hypertension   . Morbid obesity ([place])   . NICM (nonischemic cardiomyopathy) ([place])    a. LHC 01/2015: normal cors, EF 45-50%. b. EF 50-55% in 08/2016.  [name] Nonischemic cardiomyopathy ([place])   .  Normal coronary arteries   . OSA (obstructive sleep apnea)    "never sent CPAP out" (01/14/2015)  . Paroxysmal atrial fibrillation (HCC)   . Pneumonia X 2  . Pulmonary hypertension   . Respiratory failure ([place])   . Rheumatoid arthritis ([place])   . Stroke [facility])     Assessment: 60 year old female to begin heparin for rule out PE On Lovenox PTA at 40 mg dose With renal insufficiency  Goal of Therapy:  Heparin level 0.3-0.7 units/ml Monitor platelets by anticoagulation protocol: Yes   Plan:  Heparin 4000 units iv bolus x 1 Heparin drip at 1500 units / hr 6 hour heparin level Daily heparin level, CBC  Thank you Anette Guarneri, PharmD [phone]  09/18/2016,6:27 PM

## 2016-09-19 ENCOUNTER — Inpatient Hospital Stay (HOSPITAL_COMMUNITY): Payer: Medicare Other

## 2016-09-19 DIAGNOSIS — J9600 Acute respiratory failure, unspecified whether with hypoxia or hypercapnia: Secondary | ICD-10-CM

## 2016-09-19 DIAGNOSIS — R0602 Shortness of breath: Secondary | ICD-10-CM

## 2016-09-19 DIAGNOSIS — J9601 Acute respiratory failure with hypoxia: Secondary | ICD-10-CM

## 2016-09-19 LAB — COMPREHENSIVE METABOLIC PANEL
ALBUMIN: 2.8 g/dL — AB (ref 3.5–5.0)
ALK PHOS: 85 U/L (ref 38–126)
ALT: 212 U/L — AB (ref 14–54)
AST: 130 U/L — AB (ref 15–41)
Anion gap: 13 (ref 5–15)
BUN: 99 mg/dL — AB (ref 6–20)
CALCIUM: 8.8 mg/dL — AB (ref 8.9–10.3)
CHLORIDE: 90 mmol/L — AB (ref 101–111)
CO2: 29 mmol/L (ref 22–32)
CREATININE: 4.37 mg/dL — AB (ref 0.44–1.00)
GFR calc non Af Amer: 10 mL/min — ABNORMAL LOW (ref >60)
GFR, EST AFRICAN AMERICAN: 12 mL/min — AB (ref >60)
GLUCOSE: 90 mg/dL (ref 65–99)
Potassium: 5 mmol/L (ref 3.5–5.1)
SODIUM: 132 mmol/L — AB (ref 135–145)
Total Bilirubin: 1.3 mg/dL — ABNORMAL HIGH (ref 0.3–1.2)
Total Protein: 6.1 g/dL — ABNORMAL LOW (ref 6.5–8.1)

## 2016-09-19 LAB — HEPARIN LEVEL (UNFRACTIONATED)
HEPARIN UNFRACTIONATED: 0.35 [IU]/mL (ref 0.30–0.70)
Heparin Unfractionated: 0.23 IU/mL — ABNORMAL LOW (ref 0.30–0.70)
Heparin Unfractionated: 0.33 IU/mL (ref 0.30–0.70)

## 2016-09-19 LAB — TYPE AND SCREEN
ABO/RH(D): A POS
Antibody Screen: NEGATIVE

## 2016-09-19 LAB — CBC
HCT: 24.8 % — ABNORMAL LOW (ref 36.0–46.0)
Hemoglobin: 7.2 g/dL — ABNORMAL LOW (ref 12.0–15.0)
MCH: 25.5 pg — AB (ref 26.0–34.0)
MCHC: 29 g/dL — AB (ref 30.0–36.0)
MCV: 87.9 fL (ref 78.0–100.0)
PLATELETS: 224 10*3/uL (ref 150–400)
RBC: 2.82 MIL/uL — ABNORMAL LOW (ref 3.87–5.11)
RDW: 19.7 % — AB (ref 11.5–15.5)
WBC: 5.8 10*3/uL (ref 4.0–10.5)

## 2016-09-19 LAB — ECHOCARDIOGRAM LIMITED
HEIGHTINCHES: 67 in
Weight: 5876.58 oz

## 2016-09-19 LAB — UREA NITROGEN, URINE: Urea Nitrogen, Ur: 317 mg/dL

## 2016-09-19 LAB — BASIC METABOLIC PANEL
Anion gap: 17 — ABNORMAL HIGH (ref 5–15)
BUN: 102 mg/dL — ABNORMAL HIGH (ref 6–20)
CHLORIDE: 94 mmol/L — AB (ref 101–111)
CO2: 24 mmol/L (ref 22–32)
CREATININE: 4.42 mg/dL — AB (ref 0.44–1.00)
Calcium: 8.7 mg/dL — ABNORMAL LOW (ref 8.9–10.3)
GFR calc non Af Amer: 10 mL/min — ABNORMAL LOW (ref >60)
GFR, EST AFRICAN AMERICAN: 12 mL/min — AB (ref >60)
Glucose, Bld: 110 mg/dL — ABNORMAL HIGH (ref 65–99)
Potassium: 5.2 mmol/L — ABNORMAL HIGH (ref 3.5–5.1)
Sodium: 135 mmol/L (ref 135–145)

## 2016-09-19 LAB — TROPONIN I
TROPONIN I: 0.03 ng/mL — AB (ref <0.03)
TROPONIN I: 0.03 ng/mL — AB (ref <0.03)
Troponin I: <0.03 ng/mL (ref <0.03)

## 2016-09-19 LAB — PROCALCITONIN: PROCALCITONIN: 1.21 ng/mL

## 2016-09-19 MED ORDER — FLEET ENEMA 7-19 GM/118ML RE ENEM
1.0000 | ENEMA | Freq: Once | RECTAL | Status: AC
  Administered 2016-09-19: 1 via RECTAL
  Filled 2016-09-19: qty 1

## 2016-09-19 MED ORDER — HEPARIN BOLUS VIA INFUSION
2000.0000 [IU] | Freq: Once | INTRAVENOUS | Status: AC
  Administered 2016-09-19: 2000 [IU] via INTRAVENOUS
  Filled 2016-09-19: qty 2000

## 2016-09-19 MED ORDER — DIPHENHYDRAMINE HCL 25 MG PO CAPS: 25.0000 mg | ORAL_CAPSULE | Freq: Once | ORAL | Status: DC

## 2016-09-19 MED ORDER — SODIUM CHLORIDE 0.9 % IV SOLN
INTRAVENOUS | Status: DC
  Administered 2016-09-19 – 2016-09-20 (×3): via INTRAVENOUS

## 2016-09-19 MED ORDER — SODIUM CHLORIDE 0.9 % IV SOLN
INTRAVENOUS | Status: DC
  Administered 2016-09-19: 09:00:00 via INTRAVENOUS

## 2016-09-19 MED ORDER — FUROSEMIDE 10 MG/ML IJ SOLN
40.0000 mg | Freq: Once | INTRAMUSCULAR | Status: DC
  Filled 2016-09-19: qty 4

## 2016-09-19 MED ORDER — ACETAMINOPHEN 325 MG PO TABS: 650.0000 mg | ORAL_TABLET | Freq: Once | ORAL | Status: DC

## 2016-09-19 MED ORDER — SODIUM CHLORIDE 0.9 % IV BOLUS (SEPSIS)
500.0000 mL | INTRAVENOUS | Status: AC
  Administered 2016-09-19: 500 mL via INTRAVENOUS

## 2016-09-19 MED ORDER — BISACODYL 10 MG RE SUPP
10.0000 mg | Freq: Once | RECTAL | Status: DC
  Filled 2016-09-19: qty 1

## 2016-09-19 MED ORDER — SODIUM CHLORIDE 0.9 % IV SOLN: Freq: Once | INTRAVENOUS | Status: DC

## 2016-09-19 MED ORDER — DEXTROSE 5 % IV SOLN
1.0000 g | INTRAVENOUS | Status: DC
Start: 2016-09-19 — End: 2016-09-23
  Administered 2016-09-19 – 2016-09-22 (×4): 1 g via INTRAVENOUS
  Filled 2016-09-19 (×6): qty 1

## 2016-09-19 MED ORDER — ORAL CARE MOUTH RINSE
15.0000 mL | Freq: Two times a day (BID) | OROMUCOSAL | Status: DC
  Administered 2016-09-19 – 2016-09-23 (×7): 15 mL via OROMUCOSAL

## 2016-09-19 MED ORDER — FLEET ENEMA 7-19 GM/118ML RE ENEM: 1.0000 | ENEMA | Freq: Once | RECTAL | Status: DC

## 2016-09-19 MED ORDER — FUROSEMIDE 10 MG/ML IJ SOLN
20.0000 mg | Freq: Once | INTRAMUSCULAR | Status: DC
  Filled 2016-09-19: qty 2

## 2016-09-19 NOTE — Progress Notes (Signed)
Pharmacy Antibiotic Note Paige Marshall is a 60 y.o. female admitted on 09/18/2016 with pneumonia.  Pharmacy has been consulted for cefepime and vancomycin dosing. Pt was on aztreonam but clarified PCN allergy with pt. Has tolerated cefepime here and has never had a reaction.   Plan: 1. Vancomycin 2000 IV every 48 hours.  Goal trough 15-20 mcg/mL.  2. Cefepime 1 gram IV every 24 hours 3. Follow up renal plans and adjust abx as needed    Height: 5\' 7"  (170.2 cm) Weight: (!) 367 lb 4.6 oz (166.6 kg) IBW/kg (Calculated) : 61.6  Temp (24hrs), Avg:97.5 F (36.4 C), Min:97.3 F (36.3 C), Max:97.8 F (36.6 C)   Recent Labs Lab 09/13/16 0512 09/14/16 0604 09/18/16 1126 09/18/16 1933 09/19/16 0634  WBC 7.0 7.4 7.6  --  5.8  CREATININE 2.27* 2.14* 3.71* 4.09* 4.37*  LATICACIDVEN  --   --   --  1.8  --     Estimated Creatinine Clearance: 22.9 mL/min (by C-G formula based on SCr of 4.37 mg/dL (H)).    Allergies  Allergen Reactions  . Penicillins Hives, Itching, Swelling and Other (See Comments)    Tongue swelling Has patient had a PCN reaction causing immediate rash, facial/tongue/throat swelling, SOB or lightheadedness with hypotension: Yes  Clarified with the patient her penicillin allergy. Pt has tolerated cefepime in the past. Pt also states that she can take amoxicillin.    . Citrus Hives    Antimicrobials this admission: 12/9 Azactam  >> 12/10 12/9 vancomycin  >>  12/10 cefepime >>   Dose adjustments this admission: n/a   Microbiology results: 12/9 MRSA PCR: negative  Sputum cx >> sent  Thank you for allowing pharmacy to be a part of this patient's care.  Angela Burke, PharmD, BCPS Pharmacy Resident Pager: [phone] 09/19/2016, 1:04 PM

## 2016-09-19 NOTE — Progress Notes (Signed)
Patient Name: Paige Marshall Date of Encounter: 09/19/2016  Primary Cardiologist:   Dr. Serena Croissant Problem List     Principal Problem:   Acute respiratory failure with hypoxemia [facility]) Active Problems:   Dyslipidemia   Obstructive sleep apnea   Uncontrolled hypertension   COPD mixed type (HCC)   Acute on chronic renal failure (HCC)   Anemia   Left fibular fracture   Orthostatic hypotension   Bradycardia   Acute on chronic combined systolic and diastolic CHF (congestive heart failure) ([place])     Subjective   She is breathing better but not at baseline.  She is still slightly "puffy"  Inpatient Medications    Scheduled Meds: . aztreonam  1 g Intravenous Q8H  . docusate sodium  100 mg Oral BID  . omega-3 acid ethyl esters  1 g Oral BID  . polyethylene glycol  17 g Oral BID  . sodium chloride flush  3 mL Intravenous Q12H  . vancomycin  2,000 mg Intravenous Q48H   Continuous Infusions: . sodium chloride 10 mL/hr at 09/18/16 2015  . heparin 1,900 Units/hr (09/19/16 0635)   PRN Meds: Place/Maintain arterial line **AND** sodium chloride, acetaminophen **OR** acetaminophen, albuterol, atropine, hydrALAZINE, ondansetron **OR** ondansetron (ZOFRAN) IV   Vital Signs    Vitals:   09/19/16 0500 09/19/16 0600 09/19/16 0700 09/19/16 0800  BP: 97/79 104/68 (!) 87/53 99/65  Pulse: (!) 51 (!) 51 (!) 53 (!) 52  Resp: 14 13 14 15   Temp:    97.3 F (36.3 C)  TempSrc:    Oral  SpO2: (!) 89% 92% (!) 87% 92%  Weight:      Height:        Intake/Output Summary (Last 24 hours) at 09/19/16 0848 Last data filed at 09/19/16 0800  Gross per 24 hour  Intake           2203.3 ml  Output              185 ml  Net           2018.3 ml   Filed Weights   09/18/16 1758 09/19/16 0432  Weight: (!) 356 lb 7.7 oz (161.7 kg) (!) 367 lb 4.6 oz (166.6 kg)    Physical Exam    GEN: NAD.  Neck:  Difficult to assess JVD with thick neck Cardiac: Regular Rate and Rhythm, no murmurs,  rubs, or gallops.  Mild edema.  Radials/DP/PT 2+  and equal bilaterally.  Respiratory:  Respirations  regular and unlabored, mildly decreased breath sounds GI: Soft, nontender, nondistended, BS + x 4. Skin: warm and dry, no rash. Neuro:   Strength and sensation are intact. Psych:  AAOx3.  Normal affect.  Labs    CBC  Recent Labs  09/18/16 1126  09/18/16 1933 09/19/16 0634  WBC 7.6  --   --  5.8  NEUTROABS 6.3  --   --   --   HGB 7.3*  < > 7.6* 7.2*  HCT 25.1*  < > 25.8* 24.8*  MCV 88.7  --   --  87.9  PLT 194  --   --  224  < > = values in this interval not displayed. Basic Metabolic Panel  Recent Labs  09/18/16 1607 09/18/16 1933 09/19/16 0634  NA  --  129* 132*  K  --  5.4* 5.0  CL  --  88* 90*  CO2  --  28 29  GLUCOSE  --  100* 90  BUN  --  94* 99*  CREATININE  --  4.09* 4.37*  CALCIUM  --  8.7* 8.8*  MG 3.1*  --   --   PHOS 8.4*  --   --    Liver Function Tests  Recent Labs  09/18/16 1126 09/19/16 0634  AST 278* 130*  ALT 243* 212*  ALKPHOS 78 85  BILITOT 1.3* 1.3*  PROT 6.2* 6.1*  ALBUMIN 2.8* 2.8*   No results for input(s): LIPASE, AMYLASE in the last 72 hours. Cardiac Enzymes  Recent Labs  09/18/16 1933 09/18/16 2357 09/19/16 0634  TROPONINI <0.03 0.03* 0.03*   BNP Invalid input(s): POCBNP D-Dimer No results for input(s): DDIMER in the last 72 hours. Hemoglobin A1C No results for input(s): HGBA1C in the last 72 hours. Fasting Lipid Panel No results for input(s): CHOL, HDL, LDLCALC, TRIG, CHOLHDL, LDLDIRECT in the last 72 hours. Thyroid Function Tests No results for input(s): TSH, T4TOTAL, T3FREE, THYROIDAB in the last 72 hours.  Invalid input(s): FREET3  Telemetry    Sinus bradycardia.  No sustained arrhythmias.   - Personally Reviewed  ECG    NA - Personally Reviewed  Radiology    Dg Chest Portable 1 View  Result Date: 09/18/2016 CLINICAL DATA:  Patient with chest pain and pressure. EXAM: PORTABLE CHEST 1 VIEW  COMPARISON:  Chest radiograph 09/05/2016. FINDINGS: Monitoring leads overlie the patient. Stable cardiomegaly. Minimal heterogeneous opacities lung bases bilaterally. No pleural effusion or pneumothorax. IMPRESSION: Cardiomegaly.  Basilar opacities favored to represent atelectasis. Electronically Signed   By: Lovey Newcomer M.D.   On: 09/18/2016 12:12   US Abdomen Limited Ruq  Result Date: 09/18/2016 CLINICAL DATA:  Elevated liver function tests EXAM: US ABDOMEN LIMITED - RIGHT UPPER QUADRANT COMPARISON:  03/08/2016 FINDINGS: Gallbladder: Well distended with cholelithiasis. Gallbladder wall thickening till 1 cm is noted. Negative sonographic Murphy's sign was elicited. Common bile duct: Diameter: 6.6 mm. Liver: No focal lesion identified. Within normal limits in parenchymal echogenicity. Minimal free fluid is noted. This could account for the changes in the gallbladder wall. IMPRESSION: Cholelithiasis. Thickened gallbladder wall without sonographic Murphy sign. This could still be indicative of acute cholecystitis. Clinical correlation is recommended. Electronically Signed   By: Inez Catalina M.D.   On: 09/18/2016 20:56    Cardiac Studies   Echo pending.  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  She has a history of LHC 01/2015: normal cors, EF 45-50%.  She was admitted on 12/9 with increasing respiratory failure.  Cardiology consulted for treatment of bradycardia and hypotension.   Assessment & Plan    BRADYCARDIA:  Beta blocker and CCB stopped.  Heart rate in the 50s.  No change in therapy.   PAF:  Paige Marshall has a CHA2DS2 - VASc score of 3 with  a risk of stroke of 3.1%.  HYPOTENSION:  BP is improved this AM.    ACUTE ON CHRONIC SYSTOLIC AND DIASTOLIC HF:  Echo is pending. No diuresis yesterday with hypotension.   BNP was elevated.   I will order 20 mg IV Lasix today.    ANEMIA:  Per primary team.     Signed, Minus Breeding, MD  09/19/2016, 8:48 AM

## 2016-09-19 NOTE — Progress Notes (Signed)
ANTICOAGULATION CONSULT NOTE -  Pharmacy Consult for Heparin Indication: rule out  pulmonary embolus  Allergies  Allergen Reactions  . Penicillins Hives, Itching, Swelling and Other (See Comments)    Reaction:  Tongue swelling Has patient had a PCN reaction causing immediate rash, facial/tongue/throat swelling, SOB or lightheadedness with hypotension: Yes Has patient had a PCN reaction causing severe rash involving mucus membranes or skin necrosis: No Has patient had a PCN reaction that required hospitalization No Has patient had a PCN reaction occurring within the last 10 years: No If all of the above answers are "NO", then may proceed with Cephalosporin use.  . Citrus Hives    Patient Measurements: Height: 5\' 7"  (170.2 cm) Weight: (!) 356 lb 7.7 oz (161.7 kg) IBW/kg (Calculated) : 61.6 Heparin Dosing Weight: 104 kg  Vital Signs: Temp: 97.4 F (36.3 C) (12/10 0000) Temp Source: Axillary (12/10 0000) BP: 84/57 (12/10 0100) Pulse Rate: 50 (12/10 0100)  Labs:  Recent Labs  09/18/16 1126 09/18/16 1607 09/18/16 1933 09/18/16 2357 09/19/16 0044  HGB 7.3* 7.7* 7.6*  --   --   HCT 25.1* 26.3* 25.8*  --   --   PLT 194  --   --   --   --   HEPARINUNFRC  --   --   --   --  0.23*  CREATININE 3.71*  --  4.09*  --   --   TROPONINI  --   --   --  0.03*  --     Estimated Creatinine Clearance: 24 mL/min (by C-G formula based on SCr of 4.09 mg/dL (H)).  Assessment: 60 y.o. female with possible PE for heparin  Goal of Therapy:  Heparin level 0.3-0.7 units/ml Monitor platelets by anticoagulation protocol: Yes   Plan:  Heparin 2000 units IV bolus, then increase heparin 1900 units/hr Check heparin level in 6 hours.   Phillis Knack, PharmD, BCPS  09/19/2016,1:34 AM

## 2016-09-19 NOTE — Progress Notes (Signed)
VASCULAR LAB PRELIMINARY  PRELIMINARY  PRELIMINARY  PRELIMINARY  Bilateral lower extremity venous duplex completed.    Preliminary report:  There is no obvious evidence of DVT or SVT noted in the visualized veins of the bilateral lower extremities.  There are Baker's cysts noted in the popliteal fossa, bilaterally.  No change since study done 08/29/16.  Davine Coba, RVT 09/19/2016, 9:36 AM

## 2016-09-19 NOTE — Progress Notes (Signed)
ANTICOAGULATION CONSULT NOTE -  Pharmacy Consult for Heparin Indication: rule out  pulmonary embolus  Allergies  Allergen Reactions  . Penicillins Hives, Itching, Swelling and Other (See Comments)    Tongue swelling Has patient had a PCN reaction causing immediate rash, facial/tongue/throat swelling, SOB or lightheadedness with hypotension: Yes  Clarified with the patient her penicillin allergy. Pt has tolerated cefepime in the past. Pt also states that she can take amoxicillin.    . Citrus Hives    Patient Measurements: Height: 5\' 7"  (170.2 cm) Weight: (!) 367 lb 4.6 oz (166.6 kg) IBW/kg (Calculated) : 61.6 Heparin Dosing Weight: 104 kg  Vital Signs: Temp: 97.5 F (36.4 C) (12/10 1600) Temp Source: Oral (12/10 1600) BP: 107/61 (12/10 1600) Pulse Rate: 59 (12/10 1600)  Labs:  Recent Labs  09/18/16 1126 09/18/16 1607 09/18/16 1933 09/18/16 2357 09/19/16 0044 09/19/16 0634 09/19/16 0904 09/19/16 1636  HGB 7.3* 7.7* 7.6*  --   --  7.2*  --   --   HCT 25.1* 26.3* 25.8*  --   --  24.8*  --   --   PLT 194  --   --   --   --  224  --   --   HEPARINUNFRC  --   --   --   --  0.23*  --  0.35 0.33  CREATININE 3.71*  --  4.09*  --   --  4.37*  --   --   TROPONINI  --   --  <0.03 0.03*  --  0.03*  --   --     Estimated Creatinine Clearance: 22.9 mL/min (by C-G formula based on SCr of 4.37 mg/dL (H)).  Assessment: 60 y.o. female with possible PE for heparin. Pharmacy consulted to dose heparin. Heparin level therapeutic x 2. CBC stable. No issues noted.   Goal of Therapy:  Heparin level 0.3-0.7 units/ml Monitor platelets by anticoagulation protocol: Yes   Plan:  Continue heparin IV at 1900 units/hr Daily heparin level, CBC Monitor for S&S of bleed  Thank you Anette Guarneri, PharmD [phone] 09/19/2016,5:25 PM

## 2016-09-19 NOTE — Progress Notes (Signed)
[name], female   DOB: 17-May-1956, 60 y.o.   MRN: [id number] S:feels better and is hungry O:BP 99/65 (BP Location: Right Wrist)   Pulse (!) 52   Temp 97.3 F (36.3 C) (Oral)   Resp 15   Ht 5\' 7"  (1.702 m)   Wt (!) 166.6 kg (367 lb 4.6 oz)   LMP 09/07/2011   SpO2 92%   BMI 57.53 kg/m   Intake/Output Summary (Last 24 hours) at 09/19/16 0900 Last data filed at 09/19/16 0800  Gross per 24 hour  Intake           2203.3 ml  Output              185 ml  Net           2018.3 ml   Intake/Output: I/O last 3 completed shifts: In: 2174.3 [I.V.:1574.3; IV Piggyback:600] Out: 185 [Urine:185]  Intake/Output this shift:  Total I/O In: 29 [I.V.:29] Out: -  Weight change:  Gen:WD obese AAF in NAD CVS: faint HS, bradycardic, no rub Resp:bilateral exp wheezes [id number], +BS, NT Ext: 1+ edema Neuro- +asterixis   Recent Labs Lab 09/13/16 0512 09/14/16 0604 09/18/16 1126 09/18/16 1607 09/18/16 1933 09/19/16 0634  NA 135 135 132*  --  129* 132*  K 4.4 4.5 5.6*  --  5.4* 5.0  CL 91* 92* 90*  --  88* 90*  CO2 34* 33* 31  --  28 29  GLUCOSE 96 106* 134*  --  100* 90  BUN 82* 85* 96*  --  94* 99*  CREATININE 2.27* 2.14* 3.71*  --  4.09* 4.37*  ALBUMIN  --  3.5 2.8*  --   --  2.8*  CALCIUM 8.7* 9.1 8.9  --  8.7* 8.8*  PHOS  --  5.2*  --  8.4*  --   --   AST  --   --  278*  --   --  130*  ALT  --   --  243*  --   --  212*   Liver Function Tests:  Recent Labs Lab 09/14/16 0604 09/18/16 1126 09/19/16 0634  AST  --  278* 130*  ALT  --  243* 212*  ALKPHOS  --  78 85  BILITOT  --  1.3* 1.3*  PROT  --  6.2* 6.1*  ALBUMIN 3.5 2.8* 2.8*   No results for input(s): LIPASE, AMYLASE in the last 168 hours. No results for input(s): AMMONIA in the last 168 hours. CBC:  Recent Labs Lab 09/13/16 0512 09/14/16 0604 09/18/16 1126 09/18/16 1607 09/18/16 1933 09/19/16 0634  WBC 7.0 7.4 7.6  --   --  5.8  NEUTROABS  --   --  6.3  --   --   --   HGB 8.0* 7.7*  7.3* 7.7* 7.6* 7.2*  HCT 27.2* 26.6* 25.1* 26.3* 25.8* 24.8*  MCV 89.8 89.6 88.7  --   --  87.9  PLT 207 240 194  --   --  224   Cardiac Enzymes:  Recent Labs Lab 09/18/16 1933 09/18/16 2357 09/19/16 0634  TROPONINI <0.03 0.03* 0.03*   CBG: No results for input(s): GLUCAP in the last 168 hours.  Iron Studies: No results for input(s): IRON, TIBC, TRANSFERRIN, FERRITIN in the last 72 hours. Studies/Results: Dg Chest Portable 1 View  Result Date: 09/18/2016 CLINICAL DATA:  Patient with chest pain and pressure. EXAM: PORTABLE CHEST 1 VIEW COMPARISON:  Chest radiograph 09/05/2016. FINDINGS: Monitoring leads overlie  the patient. Stable cardiomegaly. Minimal heterogeneous opacities lung bases bilaterally. No pleural effusion or pneumothorax. IMPRESSION: Cardiomegaly.  Basilar opacities favored to represent atelectasis. Electronically Signed   By: Lovey Newcomer M.D.   On: 09/18/2016 12:12   US Abdomen Limited Ruq  Result Date: 09/18/2016 CLINICAL DATA:  Elevated liver function tests EXAM: US ABDOMEN LIMITED - RIGHT UPPER QUADRANT COMPARISON:  03/08/2016 FINDINGS: Gallbladder: Well distended with cholelithiasis. Gallbladder wall thickening till 1 cm is noted. Negative sonographic Murphy's sign was elicited. Common bile duct: Diameter: 6.6 mm. Liver: No focal lesion identified. Within normal limits in parenchymal echogenicity. Minimal free fluid is noted. This could account for the changes in the gallbladder wall. IMPRESSION: Cholelithiasis. Thickened gallbladder wall without sonographic Murphy sign. This could still be indicative of acute cholecystitis. Clinical correlation is recommended. Electronically Signed   By: Inez Catalina M.D.   On: 09/18/2016 20:56   . aztreonam  1 g Intravenous Q8H  . docusate sodium  100 mg Oral BID  . omega-3 acid ethyl esters  1 g Oral BID  . polyethylene glycol  17 g Oral BID  . sodium chloride flush  3 mL Intravenous Q12H  . vancomycin  2,000 mg Intravenous Q48H     BMET    Component Value Date/Time   NA 132 (L) 09/19/2016 0634   K 5.0 09/19/2016 0634   CL 90 (L) 09/19/2016 0634   CO2 29 09/19/2016 0634   GLUCOSE 90 09/19/2016 0634   BUN 99 (H) 09/19/2016 0634   CREATININE 4.37 (H) 09/19/2016 0634   CREATININE 1.31 (H) 11/19/2013 1120   CALCIUM 8.8 (L) 09/19/2016 0634   GFRNONAA 10 (L) 09/19/2016 0634   GFRAA 12 (L) 09/19/2016 0634   CBC    Component Value Date/Time   WBC 5.8 09/19/2016 0634   RBC 2.82 (L) 09/19/2016 0634   HGB 7.2 (L) 09/19/2016 0634   HCT 24.8 (L) 09/19/2016 0634   PLT 224 09/19/2016 0634   MCV 87.9 09/19/2016 0634   MCH 25.5 (L) 09/19/2016 0634   MCHC 29.0 (L) 09/19/2016 0634   RDW 19.7 (H) 09/19/2016 0634   LYMPHSABS 0.8 09/18/2016 1126   MONOABS 0.5 09/18/2016 1126   EOSABS 0.1 09/18/2016 1126   BASOSABS 0.0 09/18/2016 1126     Assessment/Plan: 1.  AKI/CKD- in setting of BOO (bladder scan with >400cc of PVR), hypotension, and decompensated CHF.  BP too low for diuretics.  Contacted Dr. Aggie Moats to consult Cardiology due to bradycardia and hypotension and recommend transfer to ICU and PCCM consult.  She may require CVVHD if her hyperkalemia and renal function do not improve.  No emergent indication for HD at this time and too unstable for conventional HD.   1. Low FeNa consistent with prerenal and has bland urine sediment. 2. Despite volume overload, she is intravascularly volume deplete and recommend Bolus IVF's, however Cardiology ordered lasix.  Would hold off on lasix but would transfuse 1 unit prbc's then dose lasix and follow 3. She has asterixis which is related to gabapentin toxicity as well as prerenal azotemia.  She will likely require CVVHD soon if she does not improve and recommend placement of a trialysis catheter as she does not have any central access. 2. Bradycardia and Hypotension- has h/o A fib.  Was on carvedilol 50mg  bid hold and consult Cardiology to evaluate 1. Off beta-blocker and  CCB. 2. Will need central line access. 3. Myoclonic jerking- likely due to neurontin toxicity in setting of AKI/CKD.  Hold neurontin.  4. BOO- place foley catheter. 5. Anemia of chronic disease with possible acute aspect- cont to follow but may need transfusion if continues to drop. 6. Abnormal LFT's- ?early septic shock. Will check hepatitis panel and may need imaging. 7.  OSA- recommend PCCM consult 8. Disposition- .  Donetta Potts, MD [facility] [phone]

## 2016-09-19 NOTE — Progress Notes (Signed)
ANTICOAGULATION CONSULT NOTE -  Pharmacy Consult for Heparin Indication: rule out  pulmonary embolus  Allergies  Allergen Reactions  . Penicillins Hives, Itching, Swelling and Other (See Comments)    Reaction:  Tongue swelling Has patient had a PCN reaction causing immediate rash, facial/tongue/throat swelling, SOB or lightheadedness with hypotension: Yes Has patient had a PCN reaction causing severe rash involving mucus membranes or skin necrosis: No Has patient had a PCN reaction that required hospitalization No Has patient had a PCN reaction occurring within the last 10 years: No If all of the above answers are "NO", then may proceed with Cephalosporin use.  . Citrus Hives    Patient Measurements: Height: 5\' 7"  (170.2 cm) Weight: (!) 367 lb 4.6 oz (166.6 kg) IBW/kg (Calculated) : 61.6 Heparin Dosing Weight: 104 kg  Vital Signs: Temp: 97.3 F (36.3 C) (12/10 0800) Temp Source: Oral (12/10 0800) BP: 97/50 (12/10 0900) Pulse Rate: 52 (12/10 0900)  Labs:  Recent Labs  09/18/16 1126 09/18/16 1607 09/18/16 1933 09/18/16 2357 09/19/16 0044 09/19/16 0634 09/19/16 0904  HGB 7.3* 7.7* 7.6*  --   --  7.2*  --   HCT 25.1* 26.3* 25.8*  --   --  24.8*  --   PLT 194  --   --   --   --  224  --   HEPARINUNFRC  --   --   --   --  0.23*  --  0.35  CREATININE 3.71*  --  4.09*  --   --  4.37*  --   TROPONINI  --   --  <0.03 0.03*  --  0.03*  --     Estimated Creatinine Clearance: 22.9 mL/min (by C-G formula based on SCr of 4.37 mg/dL (H)).  Assessment: 60 y.o. female with possible PE for heparin. Pharmacy consulted to dose heparin. Heparin level therapeutic x 1. CBC stable. No issues noted.   Goal of Therapy:  Heparin level 0.3-0.7 units/ml Monitor platelets by anticoagulation protocol: Yes   Plan:  Continue heparin IV at 1900 units/hr Check confirmatory heparin level in 6 hours Daily heparin level, CBC Monitor for S&S of bleed  Angela Burke, PharmD, BCPS Pharmacy  Resident Pager: [phone] 09/19/2016,10:33 AM

## 2016-09-19 NOTE — Progress Notes (Signed)
Echocardiogram 2D Echocardiogram has been performed.  Paige Marshall 09/19/2016, 10:25 AM

## 2016-09-19 NOTE — Progress Notes (Signed)
Paged Dr Arty Baumgartner to notify of pt's uncontrollable jerking motions and Cr 4.37. 250 mL NS bolus started at this time per MD.

## 2016-09-19 NOTE — Progress Notes (Signed)
[facility] Progress Note Patient Name: Paige Marshall DOB: 18-Nov-1955 MRN: [id number]   Date of Service  09/19/2016  HPI/Events of Note  Constipation.   eICU Interventions  Will order: 1. Dulcolax Suppository 10 mg PR now.      Intervention Category Intermediate Interventions: Other:  Sommer,Steven Cornelia Copa 09/19/2016, 4:18 PM

## 2016-09-19 NOTE — Consult Note (Addendum)
PULMONARY / CRITICAL CARE MEDICINE   Name: Paige Marshall MRN: [id number] DOB: [birth year]    ADMISSION DATE:  09/18/2016 CONSULTATION DATE:  09/18/16  REFERRING MD:  [facility] -Dr. Lorin Mercy   CHIEF COMPLAINT:  Hypotension Paige Marshall  brief 60 y.o. female with medical history significant of NICM with EF 40-45% in April 2016, CKD stage 3 at baseline, PAF, HTN, morbid obesity, mixed type COPD. At baseline she uses O2 only at night, unable to tolerate CPAP.  She was admitted from 11/18-21 for syncope, right heart failure with NYHA class 3, PAF, morbid obesity, left fibular fracture, and acute on chronic respiratory failure.  She was recommended for SNF but she declined placement. Readmitted 11/26 to 12/5 for similar with acute on chronic hypoxic resp failure .  That improved with diuresis . She was discharged to rehab as she was considered not a surgical candidate and recommended for cam walker w/ no rehab for 4-6 weeks. . She returned to ER on 12/9 for increased sob and dry cough , felt she was feeling back up with fluid.  CXR shows no fluid overload, BNP is mildly elevated when compared to her discharge BNP. Patient's weight is up about 11 pounds since discharge.  Scr has trended up to 3.71 LFT are up as well.  She is unable to go for CTA due to kidney fxn and felt would not tolerate VQ d/t clautrophobia . On arrival to ER she was hypotensive with b/p 67/42. , HR bradycardic with 46 to 55 . Was discharged on lovenox 40mg  daily sq .  Pt was transferred to ICU , PCCM consulted   EVENTS 09/18/2016 - admit and pccm primary  SUBJECTIVE:  12/10 - creat rising. Myoclonic jerks + per renal due to gabapentin and azotemia and fluid bolus given. Renal thinks intravacular dryness. Duplex LE neg for DVT. On IV heparin for possible PE. Snce fluid bolus - Ur OP has improved per RN and patient. Patient reluctant for CVL/HD cath  VITAL SIGNS: BP 108/64   Pulse (!) 56   Temp 97.5 F (36.4 C) (Oral)   Resp 13    Ht 5\' 7"  (1.702 m)   Wt (!) 166.6 kg (367 lb 4.6 oz)   LMP 09/07/2011   SpO2 95%   BMI 57.53 kg/m   HEMODYNAMICS:    VENTILATOR SETTINGS: FiO2 (%):  [3 %] 3 %  INTAKE / OUTPUT: I/O last 3 completed shifts: In: 2174.3 [I.V.:1574.3; IV Piggyback:600] Out: 185 [Urine:185]  PHYSICAL EXAMINATION: General:  Sitting up talking in bed  Neuro:  A/O x 3 , no focal deficits noted  HEENT:  Oral mucosa moist  Cardiovascular:  SB , no m/r/g Lungs:  Lungs CTA Abdomen:  Obese , soft, NT , BS + x 4 Q Musculoskeletal:  Intact, previous right LE fx  Skin:  Intact   LABS:  PULMONARY  Recent Labs Lab 09/18/16 1141  HCO3 35.5*  TCO2 37  O2SAT 85.0    CBC  Recent Labs Lab 09/14/16 0604 09/18/16 1126 09/18/16 1607 09/18/16 1933 09/19/16 0634  HGB 7.7* 7.3* 7.7* 7.6* 7.2*  HCT 26.6* 25.1* 26.3* 25.8* 24.8*  WBC 7.4 7.6  --   --  5.8  PLT 240 194  --   --  224    COAGULATION No results for input(s): INR in the last 168 hours.  CARDIAC   Recent Labs Lab 09/18/16 1933 09/18/16 2357 09/19/16 0634  TROPONINI <0.03 0.03* 0.03*   No results for input(s): PROBNP in the  last 168 hours.   CHEMISTRY  Recent Labs Lab 09/13/16 0512 09/14/16 0604 09/18/16 1126 09/18/16 1607 09/18/16 1933 09/19/16 0634  NA 135 135 132*  --  129* 132*  K 4.4 4.5 5.6*  --  5.4* 5.0  CL 91* 92* 90*  --  88* 90*  CO2 34* 33* 31  --  28 29  GLUCOSE 96 106* 134*  --  100* 90  BUN 82* 85* 96*  --  94* 99*  CREATININE 2.27* 2.14* 3.71*  --  4.09* 4.37*  CALCIUM 8.7* 9.1 8.9  --  8.7* 8.8*  MG  --   --   --  3.1*  --   --   PHOS  --  5.2*  --  8.4*  --   --    Estimated Creatinine Clearance: 22.9 mL/min (by C-G formula based on SCr of 4.37 mg/dL (H)).   LIVER  Recent Labs Lab 09/14/16 0604 09/18/16 1126 09/19/16 0634  AST  --  278* 130*  ALT  --  243* 212*  ALKPHOS  --  78 85  BILITOT  --  1.3* 1.3*  PROT  --  6.2* 6.1*  ALBUMIN 3.5 2.8* 2.8*     INFECTIOUS  Recent  Labs Lab 09/18/16 1933 09/19/16 0634  LATICACIDVEN 1.8  --   PROCALCITON 1.20 1.21     ENDOCRINE CBG (last 3)  No results for input(s): GLUCAP in the last 72 hours.       IMAGING x48h  - image(s) personally visualized  -   highlighted in bold Dg Chest Portable 1 View  Result Date: 09/18/2016 CLINICAL DATA:  Patient with chest pain and pressure. EXAM: PORTABLE CHEST 1 VIEW COMPARISON:  Chest radiograph 09/05/2016. FINDINGS: Monitoring leads overlie the patient. Stable cardiomegaly. Minimal heterogeneous opacities lung bases bilaterally. No pleural effusion or pneumothorax. IMPRESSION: Cardiomegaly.  Basilar opacities favored to represent atelectasis. Electronically Signed   By: Lovey Newcomer M.D.   On: 09/18/2016 12:12   US Abdomen Limited Ruq  Result Date: 09/18/2016 CLINICAL DATA:  Elevated liver function tests EXAM: US ABDOMEN LIMITED - RIGHT UPPER QUADRANT COMPARISON:  03/08/2016 FINDINGS: Gallbladder: Well distended with cholelithiasis. Gallbladder wall thickening till 1 cm is noted. Negative sonographic Murphy's sign was elicited. Common bile duct: Diameter: 6.6 mm. Liver: No focal lesion identified. Within normal limits in parenchymal echogenicity. Minimal free fluid is noted. This could account for the changes in the gallbladder wall. IMPRESSION: Cholelithiasis. Thickened gallbladder wall without sonographic Murphy sign. This could still be indicative of acute cholecystitis. Clinical correlation is recommended. Electronically Signed   By: Inez Catalina M.D.   On: 09/18/2016 20:56       ASSESSMENT / PLAN:  PULMONARY A OSA  Acute on chronic hypercarbic/hypoxic RF    12/1o - only on [place] wihtout distress P:   BIPAP  At bedtime  And As needed   O2 to keep sat >90%  CARDIOVASCULAR A:  Bradycardia  Hypotension /shock  Acute on Chronic diastolic CHF  Hx of A Fib    - not on pressors. Duplex LE neg fo DVT. Echo pasp up and RV some dilated. Unable to CTAngi for PE  P:   Hold Cardizem, Coreg , rhythmol  Hold lasix - dry per renal Continue  Hep Drip per pharm protocol -as  PE is def in the differential  (odds of PE reducing)     RENAL A:   Acute on chronic Renal Failure (Stage 3-4 w/ baseline scr 1.6 to  1.8 )  Hyperkalemia    - seems to be responding to smal lfluid boluses. Reluctant to have HD cath  P:   Fluid bolu and normal saline at 50cc/h and then check bmet 3pm 09/19/16 Renal following  Replace electrolytes    GASTROINTESTINAL A:   Elevated LFT ? Shock Liver . [place] with thickened gall bladdder   12/10 - lft better with hydration P:   Trend LFT  NPO  Monitor gall bladder status  HEMATOLOGIC A:   Anemia of chronic disease     hgb 7.1gm% P:  Tr CBC Dc prbc order  - prbc indicate din ICU for critical illnes swhen < 7g% in absence of MI or bleeding Stat ven doppler /Echo  Hep drip per phar protocol   INFECTIOUS A:   On abx fo concrn of penumonia but cxr c/w atelectasis and afebile with normal wbc since admit Only low grade PCT +  P:   Dc vanc Ok for cefepime but low threshold to dc 09/20/16 Anti-infectives    Start     Dose/Rate Route Frequency Ordered Stop   09/19/16 1315  ceFEPIme (MAXIPIME) 1 g in dextrose 5 % 50 mL IVPB     1 g 100 mL/hr over 30 Minutes Intravenous Every 24 hours 09/19/16 1302     09/18/16 2000  vancomycin (VANCOCIN) 2,000 mg in sodium chloride 0.9 % 500 mL IVPB     2,000 mg 250 mL/hr over 120 Minutes Intravenous Every 48 hours 09/18/16 1941     09/18/16 1945  aztreonam (AZACTAM) 1 g in dextrose 5 % 50 mL IVPB  Status:  Discontinued     1 g 100 mL/hr over 30 Minutes Intravenous Every 8 hours 09/18/16 1940 09/19/16 1302      ENDOCRINE A:     No acute finding P:   Monitor BS on chem , add SSI if elevated   NEUROLOGIC A:   No acute process noted   P:   Avoid oversedation     FAMILY  - Updates: pt /family updated 12/9  And 12/10?17  - Inter-disciplinary family meet or Palliative Care  meeting due by:  12/16    The patient is critically ill with multiple organ systems failure and requires high complexity decision making for assessment and support, frequent evaluation and titration of therapies, application of advanced monitoring technologies and extensive interpretation of multiple databases.   Critical Care Time devoted to patient care services described in this note is  30  Minutes. This time reflects time of care of this signee Dr Brand Males. This critical care time does not reflect procedure time, or teaching time or supervisory time of PA/NP/Med student/Med Resident etc but could involve care discussion time    Dr. Brand Males, M.D., [facility].C.P Pulmonary and Critical Care Medicine Staff Physician [place] [facility] Pager: [phone], If no answer or between  15:00h - 7:00h: call [phone]  09/19/2016 1:00 PM

## 2016-09-20 DIAGNOSIS — I5033 Acute on chronic diastolic (congestive) heart failure: Secondary | ICD-10-CM

## 2016-09-20 DIAGNOSIS — N189 Chronic kidney disease, unspecified: Secondary | ICD-10-CM

## 2016-09-20 DIAGNOSIS — R001 Bradycardia, unspecified: Secondary | ICD-10-CM

## 2016-09-20 DIAGNOSIS — I272 Pulmonary hypertension, unspecified: Secondary | ICD-10-CM

## 2016-09-20 DIAGNOSIS — N179 Acute kidney failure, unspecified: Secondary | ICD-10-CM

## 2016-09-20 DIAGNOSIS — N170 Acute kidney failure with tubular necrosis: Secondary | ICD-10-CM

## 2016-09-20 DIAGNOSIS — E662 Morbid (severe) obesity with alveolar hypoventilation: Secondary | ICD-10-CM

## 2016-09-20 DIAGNOSIS — N183 Chronic kidney disease, stage 3 (moderate): Secondary | ICD-10-CM

## 2016-09-20 DIAGNOSIS — I480 Paroxysmal atrial fibrillation: Secondary | ICD-10-CM

## 2016-09-20 LAB — CBC
HCT: 23.6 % — ABNORMAL LOW (ref 36.0–46.0)
Hemoglobin: 7.1 g/dL — ABNORMAL LOW (ref 12.0–15.0)
MCH: 26.2 pg (ref 26.0–34.0)
MCHC: 30.1 g/dL (ref 30.0–36.0)
MCV: 87.1 fL (ref 78.0–100.0)
PLATELETS: 248 10*3/uL (ref 150–400)
RBC: 2.71 MIL/uL — ABNORMAL LOW (ref 3.87–5.11)
RDW: 20 % — ABNORMAL HIGH (ref 11.5–15.5)
WBC: 6.2 10*3/uL (ref 4.0–10.5)

## 2016-09-20 LAB — RENAL FUNCTION PANEL
Albumin: 2.7 g/dL — ABNORMAL LOW (ref 3.5–5.0)
Anion gap: 17 — ABNORMAL HIGH (ref 5–15)
BUN: 102 mg/dL — AB (ref 6–20)
CALCIUM: 8.5 mg/dL — AB (ref 8.9–10.3)
CO2: 24 mmol/L (ref 22–32)
CREATININE: 4.14 mg/dL — AB (ref 0.44–1.00)
Chloride: 93 mmol/L — ABNORMAL LOW (ref 101–111)
GFR calc Af Amer: 13 mL/min — ABNORMAL LOW (ref >60)
GFR calc non Af Amer: 11 mL/min — ABNORMAL LOW (ref >60)
GLUCOSE: 85 mg/dL (ref 65–99)
Phosphorus: 6.9 mg/dL — ABNORMAL HIGH (ref 2.5–4.6)
Potassium: 4.8 mmol/L (ref 3.5–5.1)
SODIUM: 134 mmol/L — AB (ref 135–145)

## 2016-09-20 LAB — HEPARIN LEVEL (UNFRACTIONATED)
HEPARIN UNFRACTIONATED: 0.19 [IU]/mL — AB (ref 0.30–0.70)
Heparin Unfractionated: 0.32 IU/mL (ref 0.30–0.70)
Heparin Unfractionated: 0.26 IU/mL — ABNORMAL LOW (ref 0.30–0.70)

## 2016-09-20 LAB — PROCALCITONIN: Procalcitonin: 0.9 ng/mL

## 2016-09-20 LAB — MAGNESIUM: MAGNESIUM: 2.9 mg/dL — AB (ref 1.7–2.4)

## 2016-09-20 MED ORDER — OXYCODONE HCL 5 MG PO TABS
5.0000 mg | ORAL_TABLET | Freq: Four times a day (QID) | ORAL | Status: DC | PRN
  Administered 2016-09-20 – 2016-09-23 (×6): 5 mg via ORAL
  Filled 2016-09-20 (×7): qty 1

## 2016-09-20 NOTE — Progress Notes (Signed)
Admit: 09/18/2016 LOS: 2  63F AoCKD (BL SCr mid 1s), decompensated CHF, BOO  Subjective:  Not SOB, says feels better Says feels constipated UOP excellent, improved; on IVFs  12/10 0701 - 12/11 0700 In: 1933.6 [I.V.:1883.6; IV Piggyback:50] Out: 1300 [Urine:1300]  Filed Weights   09/18/16 1758 09/19/16 0432 09/20/16 0400  Weight: (!) 161.7 kg (356 lb 7.7 oz) (!) 166.6 kg (367 lb 4.6 oz) (!) 169.7 kg (374 lb 1.9 oz)    Scheduled Meds: . bisacodyl  10 mg Rectal Once  . ceFEPime (MAXIPIME) IV  1 g Intravenous Q24H  . docusate sodium  100 mg Oral BID  . furosemide  40 mg Intravenous Once  . mouth rinse  15 mL Mouth Rinse BID  . omega-3 acid ethyl esters  1 g Oral BID  . polyethylene glycol  17 g Oral BID  . sodium chloride flush  3 mL Intravenous Q12H   Continuous Infusions: . sodium chloride 75 mL/hr at 09/20/16 0400  . heparin 2,100 Units/hr (09/20/16 0620)   PRN Meds:.Place/Maintain arterial line **AND** sodium chloride, albuterol, atropine, hydrALAZINE, ondansetron **OR** ondansetron (ZOFRAN) IV  Current Labs: reviewed    Physical Exam:  Blood pressure 103/71, pulse 72, temperature 97.7 F (36.5 C), temperature source Oral, resp. rate 18, height 5\' 7"  (1.702 m), weight (!) 169.7 kg (374 lb 1.9 oz), last menstrual period 09/07/2011, SpO2 (!) 88 %. Obese, NAD FOley in place RRR Diminished throughout, nl WOB, O2 on Obese legs No rashes AAO NAD EOMI NCAT  A 1. AoCKD3 likely related to R sided CHF, ? hypovolemia 2. Obesity / Hypoventilation 3. Anemia 4. Bradycardia 5. CHF, NICM, diastolic 6. Hx/o HTN 7. MOrbid Obesity 8. OSA not on CPAP 9. Rheumatoid Arthritis 10. HLD   P 1. Is responding to gentle hydration, foley, time; would cautiously continue 2. I don't think needs HD Cath at this time 3. Daily weights, Daily Renal Panel, Strict I/Os, Avoid nephrotoxins (NSAIDs, judicious IV Contrast)   Pearson Grippe MD 09/20/2016, 9:42 AM   Recent Labs Lab  09/14/16 0604  09/18/16 1607  09/19/16 0634 09/19/16 1636 09/20/16 0453  NA 135  < >  --   < > 132* 135 134*  K 4.5  < >  --   < > 5.0 5.2* 4.8  CL 92*  < >  --   < > 90* 94* 93*  CO2 33*  < >  --   < > 29 24 24   GLUCOSE 106*  < >  --   < > 90 110* 85  BUN 85*  < >  --   < > 99* 102* 102*  CREATININE 2.14*  < >  --   < > 4.37* 4.42* 4.14*  CALCIUM 9.1  < >  --   < > 8.8* 8.7* 8.5*  PHOS 5.2*  --  8.4*  --   --   --  6.9*  < > = values in this interval not displayed.  Recent Labs Lab 09/18/16 1126  09/18/16 1933 09/19/16 0634 09/20/16 0453  WBC 7.6  --   --  5.8 6.2  NEUTROABS 6.3  --   --   --   --   HGB 7.3*  < > 7.6* 7.2* 7.1*  HCT 25.1*  < > 25.8* 24.8* 23.6*  MCV 88.7  --   --  87.9 87.1  PLT 194  --   --  224 248  < > = values in this interval not displayed.

## 2016-09-20 NOTE — Progress Notes (Signed)
Patient placed on CPAP QHS and doing well. 4l O2 Bleed in and sats 94%

## 2016-09-20 NOTE — Progress Notes (Signed)
PULMONARY / CRITICAL CARE MEDICINE   Name: Paige Marshall MRN: [id number] DOB: [birth year]    ADMISSION DATE:  09/18/2016 CONSULTATION DATE:  09/18/16  REFERRING MD:  [facility] -Dr. Lorin Marshall   CHIEF COMPLAINT:  Hypotension Paige Marshall  brief 60 y.o. female with medical history significant of NICM with EF 40-45% in April 2016, CKD stage 3 at baseline, PAF, HTN, morbid obesity, mixed type COPD. At baseline she uses O2 only at night, unable to tolerate CPAP.  She was admitted from 11/18-21 for syncope, right heart failure with NYHA class 3, PAF, morbid obesity, left fibular fracture, and acute on chronic respiratory failure.  She was recommended for SNF but she declined placement. Readmitted 11/26 to 12/5 for similar with acute on chronic hypoxic resp failure .  That improved with diuresis . She was discharged to rehab as she was considered not a surgical candidate and recommended for cam walker w/ no rehab for 4-6 weeks. . She returned to ER on 12/9 for increased sob and dry cough , felt she was feeling back up with fluid.  CXR shows no fluid overload, BNP is mildly elevated when compared to her discharge BNP. Patient's weight is up about 11 pounds since discharge.  Scr has trended up to 3.71 LFT are up as well.  She is unable to go for CTA due to kidney fxn and felt would not tolerate VQ d/t clautrophobia . On arrival to ER she was hypotensive with b/p 67/42. , HR bradycardic with 46 to 55 . Was discharged on lovenox 40mg  daily sq .  Pt was transferred to ICU , PCCM consulted   EVENTS 09/18/2016 - admit and pccm primary  SUBJECTIVE:  No events overnight other than not defecating  VITAL SIGNS: BP 103/71   Pulse 72   Temp 97.7 F (36.5 C) (Oral)   Resp 18   Ht 5\' 7"  (1.702 m)   Wt (!) 169.7 kg (374 lb 1.9 oz)   LMP 09/07/2011   SpO2 (!) 88%   BMI 58.60 kg/m   HEMODYNAMICS:    VENTILATOR SETTINGS:    INTAKE / OUTPUT: I/O last 3 completed shifts: In: 3104.7 [I.V.:2454.7; IV  Piggyback:650] Out: 1435 [id number]  PHYSICAL EXAMINATION: General:  Sitting up talking in bed  Neuro:  A/O x 3 , no focal deficits noted  HEENT:  Oral mucosa moist  Cardiovascular:  SB , no m/r/g Lungs:  Decreased BS at the bases Abdomen:  Obese , soft, NT , BS + x 4 Q Musculoskeletal:  Intact, previous right LE fx  Skin:  Intact   LABS:  PULMONARY  Recent Labs Lab 09/18/16 1141  HCO3 35.5*  TCO2 37  O2SAT 85.0    CBC  Recent Labs Lab 09/18/16 1126  09/18/16 1933 09/19/16 0634 09/20/16 0453  HGB 7.3*  < > 7.6* 7.2* 7.1*  HCT 25.1*  < > 25.8* 24.8* 23.6*  WBC 7.6  --   --  5.8 6.2  PLT 194  --   --  224 248  < > = values in this interval not displayed.  COAGULATION No results for input(s): INR in the last 168 hours.  CARDIAC    Recent Labs Lab 09/18/16 1933 09/18/16 2357 09/19/16 0634  TROPONINI <0.03 0.03* 0.03*   No results for input(s): PROBNP in the last 168 hours.   CHEMISTRY  Recent Labs Lab 09/14/16 0604 09/18/16 1126 09/18/16 1607 09/18/16 1933 09/19/16 0634 09/19/16 1636 09/20/16 0453  NA 135 132*  --  129* 132*  135 134*  K 4.5 5.6*  --  5.4* 5.0 5.2* 4.8  CL 92* 90*  --  88* 90* 94* 93*  CO2 33* 31  --  28 29 24 24   GLUCOSE 106* 134*  --  100* 90 110* 85  BUN 85* 96*  --  94* 99* 102* 102*  CREATININE 2.14* 3.71*  --  4.09* 4.37* 4.42* 4.14*  CALCIUM 9.1 8.9  --  8.7* 8.8* 8.7* 8.5*  MG  --   --  3.1*  --   --   --  2.9*  PHOS 5.2*  --  8.4*  --   --   --  6.9*   Estimated Creatinine Clearance: 24.5 mL/min (by C-G formula based on SCr of 4.14 mg/dL (H)).  LIVER  Recent Labs Lab 09/14/16 0604 09/18/16 1126 09/19/16 0634 09/20/16 0453  AST  --  278* 130*  --   ALT  --  243* 212*  --   ALKPHOS  --  78 85  --   BILITOT  --  1.3* 1.3*  --   PROT  --  6.2* 6.1*  --   ALBUMIN 3.5 2.8* 2.8* 2.7*   INFECTIOUS  Recent Labs Lab 09/18/16 1933 09/19/16 0634 09/20/16 0453  LATICACIDVEN 1.8  --   --   PROCALCITON 1.20  1.21 0.90   ENDOCRINE CBG (last 3)  No results for input(s): GLUCAP in the last 72 hours.  IMAGING x48h  - image(s) personally visualized  -   highlighted in bold Dg Chest Portable 1 View  Result Date: 09/18/2016 CLINICAL DATA:  Patient with chest pain and pressure. EXAM: PORTABLE CHEST 1 VIEW COMPARISON:  Chest radiograph 09/05/2016. FINDINGS: Monitoring leads overlie the patient. Stable cardiomegaly. Minimal heterogeneous opacities lung bases bilaterally. No pleural effusion or pneumothorax. IMPRESSION: Cardiomegaly.  Basilar opacities favored to represent atelectasis. Electronically Signed   By: Lovey Newcomer M.D.   On: 09/18/2016 12:12   US Abdomen Limited Ruq  Result Date: 09/18/2016 CLINICAL DATA:  Elevated liver function tests EXAM: US ABDOMEN LIMITED - RIGHT UPPER QUADRANT COMPARISON:  03/08/2016 FINDINGS: Gallbladder: Well distended with cholelithiasis. Gallbladder wall thickening till 1 cm is noted. Negative sonographic Murphy's sign was elicited. Common bile duct: Diameter: 6.6 mm. Liver: No focal lesion identified. Within normal limits in parenchymal echogenicity. Minimal free fluid is noted. This could account for the changes in the gallbladder wall. IMPRESSION: Cholelithiasis. Thickened gallbladder wall without sonographic Murphy sign. This could still be indicative of acute cholecystitis. Clinical correlation is recommended. Electronically Signed   By: Inez Catalina M.D.   On: 09/18/2016 20:56   I reviewed CXR myself, cardiomegaly and pulmonary edema noted.  ASSESSMENT / PLAN:  PULMONARY A OSA  Acute on chronic hypercarbic/hypoxic RF due to OHV  P:   BIPAP  At bedtime O2 to keep sat >90%  CARDIOVASCULAR A:  Bradycardia  Hypotension /shock  Acute on Chronic diastolic CHF  Hx of A Fib   P:  Hold Cardizem, Coreg , rhythmol  Hold lasix - dry per renal Continue hep drip per pharm protocol - as  PE is def in the differential, will need a V/Q scan when more  stable  RENAL A:   Acute on chronic Renal Failure (Stage 3-4 w/ baseline scr 1.6 to 1.8 )  Hyperkalemia   P:   KVO IVF ?CRRT, will defer to renal Renal following  Replace electrolytes as indicated BMET in AM  GASTROINTESTINAL A:   Elevated LFT ? Shock Liver .  [place] with thickened gall bladdder  P:   Trend LFT  Renal diet Monitor gall bladder status  HEMATOLOGIC A:   Anemia of chronic disease   P:  Tr CBC Dc prbc order  - prbc indicate din ICU for critical illnes swhen < 7g% in absence of MI or bleeding Venous doppler prelim negative for DVT Hep drip per phar protocol pending stability for a V/Q scan.  INFECTIOUS A:   On abx fo concrn of penumonia but cxr c/w atelectasis and afebile with normal wbc since admit Only low grade PCT +  P:   Dc vanc Continue cefepime for now  ENDOCRINE A:     No acute finding P:   Monitor BS on chem, suprisingly not diabetic  NEUROLOGIC A:   No acute process noted   P:   Avoid oversedation   FAMILY  - Updates: Transfer to SDU, patient updated bedside  - Inter-disciplinary family meet or Palliative Care meeting due by:  12/16   Transfer to SDU and back to [facility] service with PCCM off 12/12.  Discussed to TRH-MD.  Rush Farmer, M.D. [facility] Pulmonary/Critical Care Medicine. Pager: [phone]. After hours pager: [phone].  09/20/2016 9:46 AM

## 2016-09-20 NOTE — Progress Notes (Signed)
Patient Name: Paige Marshall Date of Encounter: 09/20/2016  Primary Cardiologist: Dr. Gwenlyn Found  Patient Profile     [name] a 60 y.o.femalewith history of morbid obesity, former tobacco abuse, PAF, OSA noncompliant with CPAP (uses [place] QHS), NICM, chronic combined CHF, normal coronary arteries, HTN, dyslipidemia, CKD stage III-IV(prev baseline CR around 1.6), COPD, suspected obesity-related hypoventilation syndrome, anemia of chronic disease, rheumatoid arthritis. She has a history [place] 01/2015: normal cors, EF 45-50%.  She was admitted on 12/9 with increasing respiratory failure.  Cardiology consulted for treatment of bradycardia and hypotension.  - HR stabilized in 50s, remains hypotensive, but not on pressors. S Bradycardia - Anemic - not on [facility] for Afib  Hospital Problem List     Principal Problem:   Acute respiratory failure with hypoxemia (HCC) Active Problems:   Dyslipidemia   Obstructive sleep apnea   Uncontrolled hypertension   COPD mixed type (HCC)   Acute on chronic renal failure (HCC)   Anemia   Left fibular fracture   Orthostatic hypotension   Bradycardia   Acute on chronic combined systolic and diastolic CHF (congestive heart failure) (HCC)    Subjective   Major complaint today is that she needs to have a bowel movement (talking to the nurse, she has had multiple different products including enemas and disimpaction etc.) had a bowel movement yesterday. Breathing is fine still remains puffy, but improved  Inpatient Medications    Scheduled Meds: . bisacodyl  10 mg Rectal Once  . ceFEPime (MAXIPIME) IV  1 g Intravenous Q24H  . docusate sodium  100 mg Oral BID  . furosemide  40 mg Intravenous Once  . mouth rinse  15 mL Mouth Rinse BID  . omega-3 acid ethyl esters  1 g Oral BID  . polyethylene glycol  17 g Oral BID  . sodium chloride flush  3 mL Intravenous Q12H   Continuous Infusions: . sodium chloride 75 mL/hr at 09/20/16 0400  .  heparin 2,100 Units/hr (09/20/16 0620)   PRN Meds: Place/Maintain arterial line **AND** sodium chloride, albuterol, atropine, hydrALAZINE, ondansetron **OR** ondansetron (ZOFRAN) IV   Vital Signs    Vitals:   09/20/16 0300 09/20/16 0400 09/20/16 0500 09/20/16 0600  BP: (!) 80/40   (!) 91/45  Pulse: 62   63  Resp: 15 16 15 12   Temp: 97.7 F (36.5 C)     TempSrc: Oral     SpO2: 97%   97%  Weight:  (!) 169.7 kg (374 lb 1.9 oz)    Height:        Intake/Output Summary (Last 24 hours) at 09/20/16 0803 Last data filed at 09/20/16 0700  Gross per 24 hour  Intake          1904.58 ml  Output             1300 ml  Net           604.58 ml   Filed Weights   09/18/16 1758 09/19/16 0432 09/20/16 0400  Weight: (!) 161.7 kg (356 lb 7.7 oz) (!) 166.6 kg (367 lb 4.6 oz) (!) 169.7 kg (374 lb 1.9 oz)    Physical Exam    GEN: Well nourished, well developed; morbidly obese Neck: Supple, unable to assess JVD,  Cardiac: RRR -distant S1 and S2; No M/R/G. No clubbing, cyanosis,.  Radials/DP/PT 2+ and equal bilaterally.  Edema at least 2+ in the legs. SCDs in place. Respiratory:  Respirations regular and unlabored, clear to auscultation bilaterally with  fine extra wheezes. Generally reduced breath sounds throughout. GI: Soft, nontender, nondistended, BS + x 4. Obese Skin: warm and dry, no rash. Psych: AAOx3.  Normal affect.  Labs    CBC  Recent Labs  09/18/16 1126  09/19/16 0634 09/20/16 0453  WBC 7.6  --  5.8 6.2  NEUTROABS 6.3  --   --   --   HGB 7.3*  < > 7.2* 7.1*  HCT 25.1*  < > 24.8* 23.6*  MCV 88.7  --  87.9 87.1  PLT 194  --  224 248  < > = values in this interval not displayed. Basic Metabolic Panel  Recent Labs  09/18/16 1607  09/19/16 1636 09/20/16 0453  NA  --   < > 135 134*  K  --   < > 5.2* 4.8  CL  --   < > 94* 93*  CO2  --   < > 24 24  GLUCOSE  --   < > 110* 85  BUN  --   < > 102* 102*  CREATININE  --   < > 4.42* 4.14*  CALCIUM  --   < > 8.7* 8.5*  MG 3.1*   --   --  2.9*  PHOS 8.4*  --   --  6.9*  < > = values in this interval not displayed. Liver Function Tests  Recent Labs  09/18/16 1126 09/19/16 0634 09/20/16 0453  AST 278* 130*  --   ALT 243* 212*  --   ALKPHOS 78 85  --   BILITOT 1.3* 1.3*  --   PROT 6.2* 6.1*  --   ALBUMIN 2.8* 2.8* 2.7*   No results for input(s): LIPASE, AMYLASE in the last 72 hours. Cardiac Enzymes  Recent Labs  09/18/16 1933 09/18/16 2357 09/19/16 0634  TROPONINI <0.03 0.03* 0.03*   BNP Invalid input(s): POCBNP D-Dimer No results for input(s): DDIMER in the last 72 hours. Hemoglobin A1C No results for input(s): HGBA1C in the last 72 hours. Fasting Lipid Panel No results for input(s): CHOL, HDL, LDLCALC, TRIG, CHOLHDL, LDLDIRECT in the last 72 hours. Thyroid Function Tests No results for input(s): TSH, T4TOTAL, T3FREE, THYROIDAB in the last 72 hours.  Invalid input(s): FREET3  Telemetry    Sinus rhythm in 60s - Personally Reviewed  ECG    No EKG today  Radiology    Dg Chest Portable 1 View  Result Date: 09/18/2016 CLINICAL DATA:  Patient with chest pain and pressure. EXAM: PORTABLE CHEST 1 VIEW COMPARISON:  Chest radiograph 09/05/2016. FINDINGS: Monitoring leads overlie the patient. Stable cardiomegaly. Minimal heterogeneous opacities lung bases bilaterally. No pleural effusion or pneumothorax. IMPRESSION: Cardiomegaly.  Basilar opacities favored to represent atelectasis. Electronically Signed   By: Lovey Newcomer M.D.   On: 09/18/2016 12:12   US Abdomen Limited Ruq  Result Date: 09/18/2016 CLINICAL DATA:  Elevated liver function tests EXAM: US ABDOMEN LIMITED - RIGHT UPPER QUADRANT COMPARISON:  03/08/2016 FINDINGS: Gallbladder: Well distended with cholelithiasis. Gallbladder wall thickening till 1 cm is noted. Negative sonographic Murphy's sign was elicited. Common bile duct: Diameter: 6.6 mm. Liver: No focal lesion identified. Within normal limits in parenchymal echogenicity. Minimal free  fluid is noted. This could account for the changes in the gallbladder wall. IMPRESSION: Cholelithiasis. Thickened gallbladder wall without sonographic Murphy sign. This could still be indicative of acute cholecystitis. Clinical correlation is recommended. Electronically Signed   By: Inez Catalina M.D.   On: 09/18/2016 20:56    Cardiac Studies  Echo 12/10: Mod LVH. EF ~50-55%. No RWMA. Septum with diastolic flattening - c/w increase  RV pressures (estimated Peak PAP ~46 mmHg). Mod RV dilation & mildly reduced RV Fxn (borderline Cor Pulmonale)  LE Venous Dopplers 12/10: no evidence of DVT or Superficial Venous Thrombosis. R Baker's cyst.  Assessment & Plan  Principal Problem:   Acute respiratory failure with hypoxemia (HCC) - multifactorial, but suspect that a major portion is related to OSA/OHS.  PCCM treating for possible PNA, de-escalating Rx    Obesity hypoventilation syndrome (HCC) - per PCCM, Bedtime BiPAP   Pulmonary hypertension - chronic,but concern for PE given presentation: on Heparin gtt   No DVT on dopplers, but Echo findings are somewhat concerning with septal flattening (suspect that Peak PA pressures are underestimated  If PE is identified, would recommend IVC filter. --> Consider VQ scan since CT scan with contrast would be contraindicated with renal insufficiency    Acute on chronic diastolic CHF (congestive heart failure) ([place]) - complicated by initial hypotension/shock subsequently resolved  Seems to be intravascularly dry / anemic with pre-renal failure, but total body hypervolemic -- defer diuretics to Nephrology    Anemia - Hrg of ~7, down from 10.4 on Nov 18; agree with transfusion at least 1 unit (apparently plan is not transfusion until less than 7) -- I suspect that by increasing blood volume that her pressures will stabilize and potentially even the help her renal function. May help with recruitment of third spaced volume.  Agree that until source is  identified, would not restart long term AC for AF      PAF (paroxysmal atrial fibrillation) (HCC) -- Bradycardia - stable HR now, off BB & CCB  Will need to monitor during BB washout for RVR  Not on AC despite CHA2DS2Vasc of 3 b/c anemia & concern for bleed.  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

## 2016-09-20 NOTE — Progress Notes (Signed)
[place] Progress Note Patient Name: Paige Marshall DOB: 1956/05/01 MRN: [id number]   Date of Service  09/20/2016  HPI/Events of Note  Patient c/o pain related to recent tib-fib Fx. Requests pain medication.   eICU Interventions  Will order: 1. Oxycodone IR 5 mg PO Q 6 hours PRN.      Intervention Category Intermediate Interventions: Pain - evaluation and management  [name] 09/20/2016, 5:13 PM

## 2016-09-20 NOTE — Progress Notes (Signed)
ANTICOAGULATION CONSULT NOTE - Follow Up Consult  Pharmacy Consult for Heparin  Indication: Rule out PE  Allergies  Allergen Reactions  . Penicillins Hives, Itching, Swelling and Other (See Comments)    Tongue swelling Has patient had a PCN reaction causing immediate rash, facial/tongue/throat swelling, SOB or lightheadedness with hypotension: Yes  Clarified with the patient her penicillin allergy. Pt has tolerated cefepime in the past. Pt also states that she can take amoxicillin.    . Citrus Hives    Patient Measurements: Height: 5\' 7"  (170.2 cm) Weight: (!) 374 lb 1.9 oz (169.7 kg) IBW/kg (Calculated) : 61.6  Vital Signs: Temp: 97.7 F (36.5 C) (12/11 0300) Temp Source: Oral (12/11 0300) BP: 80/40 (12/11 0300) Pulse Rate: 62 (12/11 0300)  Labs:  Recent Labs  09/18/16 1126 09/18/16 1607 09/18/16 1933 09/18/16 2357  09/19/16 0634 09/19/16 0904 09/19/16 1636 09/20/16 0453  HGB 7.3* 7.7* 7.6*  --   --  7.2*  --   --   --   HCT 25.1* 26.3* 25.8*  --   --  24.8*  --   --   --   PLT 194  --   --   --   --  224  --   --   --   HEPARINUNFRC  --   --   --   --   < >  --  0.35 0.33 0.19*  CREATININE 3.71*  --  4.09*  --   --  4.37*  --  4.42*  --   TROPONINI  --   --  <0.03 0.03*  --  0.03*  --   --   --   < > = values in this interval not displayed.  Estimated Creatinine Clearance: 23 mL/min (by C-G formula based on SCr of 4.42 mg/dL (H)).   Assessment: 60 y/o F on heparin for rule out PE, ?are we getting VQ at some point, noted renal dysfunction, heparin level is low this AM, no issues per RN.   Goal of Therapy:  Heparin level 0.3-0.7 units/ml Monitor platelets by anticoagulation protocol: Yes   Plan:  -Inc heparin to 2100 units/hr -1400 HL  Narda Bonds 09/20/2016,6:16 AM

## 2016-09-20 NOTE — Progress Notes (Signed)
Report called to [place]. Patients foley d/c'd per protocol. Receiving RN informed to watch for return of urine output. Will transfer on monitor.

## 2016-09-20 NOTE — Progress Notes (Signed)
ANTICOAGULATION CONSULT NOTE - Follow Up Consult  Pharmacy Consult for Heparin  Indication: Rule out PE  Allergies  Allergen Reactions  . Penicillins Hives, Itching, Swelling and Other (See Comments)    Tongue swelling Has patient had a PCN reaction causing immediate rash, facial/tongue/throat swelling, SOB or lightheadedness with hypotension: Yes  Clarified with the patient her penicillin allergy. Pt has tolerated cefepime in the past. Pt also states that she can take amoxicillin.    . Citrus Hives    Patient Measurements: Height: 5\' 7"  (170.2 cm) Weight: (!) 374 lb 1.9 oz (169.7 kg) IBW/kg (Calculated) : 61.6  Vital Signs: Temp: 98.6 F (37 C) (12/11 1100) Temp Source: Oral (12/11 0800) BP: 113/81 (12/11 1200) Pulse Rate: 65 (12/11 1200)  Labs:  Recent Labs  09/18/16 1126  09/18/16 1933 09/18/16 2357  09/19/16 0634  09/19/16 1636 09/20/16 0453 09/20/16 1323  HGB 7.3*  < > 7.6*  --   --  7.2*  --   --  7.1*  --   HCT 25.1*  < > 25.8*  --   --  24.8*  --   --  23.6*  --   PLT 194  --   --   --   --  224  --   --  248  --   HEPARINUNFRC  --   --   --   --   < >  --   < > 0.33 0.19* 0.32  CREATININE 3.71*  --  4.09*  --   --  4.37*  --  4.42* 4.14*  --   TROPONINI  --   --  <0.03 0.03*  --  0.03*  --   --   --   --   < > = values in this interval not displayed.  Estimated Creatinine Clearance: 24.5 mL/min (by C-G formula based on SCr of 4.14 mg/dL (H)).   Assessment: 60 y/o F on heparin for rule out PE. Doppler is negative for DVT and plans for VQ scan when able. -Heparin level= 0.32 on 2100 units/hr  Goal of Therapy:  Heparin level 0.3-0.7 units/ml Monitor platelets by anticoagulation protocol: Yes   Plan:  -Increase heparin to 2200 units/hr to keep in goal  -Heparin level in 8 hours and daily wth CBC daily  Hildred Laser, Pharm D 09/20/2016 2:30 PM

## 2016-09-21 DIAGNOSIS — I509 Heart failure, unspecified: Secondary | ICD-10-CM

## 2016-09-21 DIAGNOSIS — I130 Hypertensive heart and chronic kidney disease with heart failure and stage 1 through stage 4 chronic kidney disease, or unspecified chronic kidney disease: Principal | ICD-10-CM

## 2016-09-21 LAB — BASIC METABOLIC PANEL
Anion gap: 11 (ref 5–15)
BUN: 91 mg/dL — AB (ref 6–20)
CALCIUM: 8.7 mg/dL — AB (ref 8.9–10.3)
CO2: 29 mmol/L (ref 22–32)
CREATININE: 3.19 mg/dL — AB (ref 0.44–1.00)
Chloride: 95 mmol/L — ABNORMAL LOW (ref 101–111)
GFR calc Af Amer: 17 mL/min — ABNORMAL LOW (ref >60)
GFR, EST NON AFRICAN AMERICAN: 15 mL/min — AB (ref >60)
Glucose, Bld: 113 mg/dL — ABNORMAL HIGH (ref 65–99)
Potassium: 4.6 mmol/L (ref 3.5–5.1)
SODIUM: 135 mmol/L (ref 135–145)

## 2016-09-21 LAB — IRON AND TIBC
Iron: 29 ug/dL (ref 28–170)
Saturation Ratios: 10 % — ABNORMAL LOW (ref 10.4–31.8)
TIBC: 301 ug/dL (ref 250–450)
UIBC: 272 ug/dL

## 2016-09-21 LAB — PREPARE RBC (CROSSMATCH)

## 2016-09-21 LAB — CBC
HCT: 23.5 % — ABNORMAL LOW (ref 36.0–46.0)
Hemoglobin: 7 g/dL — ABNORMAL LOW (ref 12.0–15.0)
MCH: 26.4 pg (ref 26.0–34.0)
MCHC: 29.8 g/dL — ABNORMAL LOW (ref 30.0–36.0)
MCV: 88.7 fL (ref 78.0–100.0)
PLATELETS: 197 10*3/uL (ref 150–400)
RBC: 2.65 MIL/uL — AB (ref 3.87–5.11)
RDW: 20.5 % — AB (ref 11.5–15.5)
WBC: 6.1 10*3/uL (ref 4.0–10.5)

## 2016-09-21 LAB — FOLATE: Folate: 18.7 ng/mL (ref >5.9)

## 2016-09-21 LAB — FERRITIN: FERRITIN: 317 ng/mL — AB (ref 11–307)

## 2016-09-21 LAB — VITAMIN B12: Vitamin B-12: 1220 pg/mL — ABNORMAL HIGH (ref 180–914)

## 2016-09-21 LAB — MAGNESIUM: MAGNESIUM: 2.7 mg/dL — AB (ref 1.7–2.4)

## 2016-09-21 LAB — HEPARIN LEVEL (UNFRACTIONATED): HEPARIN UNFRACTIONATED: 0.4 [IU]/mL (ref 0.30–0.70)

## 2016-09-21 LAB — PHOSPHORUS: PHOSPHORUS: 5.6 mg/dL — AB (ref 2.5–4.6)

## 2016-09-21 LAB — RETICULOCYTES
RBC.: 2.72 MIL/uL — AB (ref 3.87–5.11)
RETIC CT PCT: 5.6 % — AB (ref 0.4–3.1)
Retic Count, Absolute: 152.3 10*3/uL (ref 19.0–186.0)

## 2016-09-21 MED ORDER — DIPHENHYDRAMINE HCL 50 MG/ML IJ SOLN
25.0000 mg | Freq: Once | INTRAMUSCULAR | Status: AC
  Administered 2016-09-21: 25 mg via INTRAVENOUS
  Filled 2016-09-21: qty 1

## 2016-09-21 MED ORDER — SODIUM CHLORIDE 0.9 % IV SOLN: Freq: Once | INTRAVENOUS | Status: AC

## 2016-09-21 MED ORDER — POLYETHYLENE GLYCOL 3350 17 G PO PACK: 17.0000 g | PACK | Freq: Every day | ORAL | Status: DC | PRN

## 2016-09-21 MED ORDER — ACETAMINOPHEN 325 MG PO TABS
650.0000 mg | ORAL_TABLET | Freq: Once | ORAL | Status: AC
  Administered 2016-09-21: 650 mg via ORAL
  Filled 2016-09-21: qty 2

## 2016-09-21 NOTE — Progress Notes (Signed)
ANTICOAGULATION CONSULT NOTE - Follow Up Consult  Pharmacy Consult for heparin Indication: rule out pulmonary embolus  Allergies  Allergen Reactions  . Penicillins Hives, Itching, Swelling and Other (See Comments)    Tongue swelling Has patient had a PCN reaction causing immediate rash, facial/tongue/throat swelling, SOB or lightheadedness with hypotension: Yes  Clarified with the patient her penicillin allergy. Pt has tolerated cefepime in the past. Pt also states that she can take amoxicillin.    . Citrus Hives    Patient Measurements: Height: 5\' 7"  (170.2 cm) Weight: (!) 366 lb (166 kg) IBW/kg (Calculated) : 61.6 Heparin Dosing Weight: 102.4 kg  Vital Signs: Temp: 98 F (36.7 C) (12/12 0339) Temp Source: Oral (12/12 0339) BP: 93/71 (12/12 0339) Pulse Rate: 72 (12/12 0339)  Labs:  Recent Labs  09/18/16 1933 09/18/16 2357  09/19/16 [id number]  09/19/16 1636 09/20/16 0453 09/20/16 1323 09/20/16 2316 09/21/16 0502 09/21/16 0834  HGB 7.6*  --   --  7.2*  --   --  7.1*  --   --  7.0*  --   HCT 25.8*  --   --  24.8*  --   --  23.6*  --   --  23.5*  --   PLT  --   --   --  224  --   --  248  --   --  197  --   HEPARINUNFRC  --   --   < >  --   < > 0.33 0.19* 0.32 0.26*  --  0.40  CREATININE 4.09*  --   --  4.37*  --  4.42* 4.14*  --   --  3.19*  --   TROPONINI <0.03 0.03*  --  0.03*  --   --   --   --   --   --   --   < > = values in this interval not displayed.  Estimated Creatinine Clearance: 31.4 mL/min (by C-G formula based on SCr of 3.19 mg/dL (H)).   Medications:  Scheduled:  . bisacodyl  10 mg Rectal Once  . ceFEPime (MAXIPIME) IV  1 g Intravenous Q24H  . docusate sodium  100 mg Oral BID  . furosemide  40 mg Intravenous Once  . mouth rinse  15 mL Mouth Rinse BID  . omega-3 acid ethyl esters  1 g Oral BID  . polyethylene glycol  17 g Oral BID  . sodium chloride flush  3 mL Intravenous Q12H   Infusions:  . sodium chloride 10 mL/hr at 09/21/16 0600  .  heparin 2,350 Units/hr (09/21/16 0600)    Assessment: 60 yo F continues on heparin for rule-out PE.  Doppler negative for DVT.  Unable to have CT scan due to ARF, awiating VQ scan to rule out PE.  Heparin currently therapeutic on 2350 units/hr.  Goal of Therapy:  Heparin level 0.3-0.7 units/ml Monitor platelets by anticoagulation protocol: Yes   Plan:  Continue heparin at 2350 units/hr Follow-up VQ scan results Daily heparin level, CBC  Crista Nuon, Pharm.D., BCPS Clinical Pharmacist Pager [phone] 09/21/2016 1:22 PM

## 2016-09-21 NOTE — Progress Notes (Signed)
Admit: 09/18/2016 LOS: 3  37F AoCKD (BL SCr mid 1s), decompensated CHF, BOO  Subjective:  Not SOB, says feels better GFR nicely improved UOP excellent Off IVFs For pRBC today   12/11 0701 - 12/12 0700 In: 1467 [P.O.:560; I.V.:857; IV Piggyback:50] Out: 1350 [id number]  Filed Weights   09/19/16 0432 09/20/16 0400 09/21/16 0339  Weight: (!) 166.6 kg (367 lb 4.6 oz) (!) 169.7 kg (374 lb 1.9 oz) (!) 166 kg (366 lb)    Scheduled Meds: . bisacodyl  10 mg Rectal Once  . ceFEPime (MAXIPIME) IV  1 g Intravenous Q24H  . docusate sodium  100 mg Oral BID  . furosemide  40 mg Intravenous Once  . mouth rinse  15 mL Mouth Rinse BID  . omega-3 acid ethyl esters  1 g Oral BID  . polyethylene glycol  17 g Oral BID  . sodium chloride flush  3 mL Intravenous Q12H   Continuous Infusions: . sodium chloride 10 mL/hr at 09/21/16 0600  . heparin 2,350 Units/hr (09/21/16 0600)   PRN Meds:.Place/Maintain arterial line **AND** sodium chloride, albuterol, atropine, hydrALAZINE, ondansetron **OR** ondansetron (ZOFRAN) IV, oxyCODONE  Current Labs: reviewed    Physical Exam:  Blood pressure 93/71, pulse 72, temperature 98 F (36.7 C), temperature source Oral, resp. rate 16, height 5\' 7"  (1.702 m), weight (!) 166 kg (366 lb), last menstrual period 09/07/2011, SpO2 96 %. Obese, NAD FOley in place RRR Diminished throughout, nl WOB, O2 on Obese legs No rashes AAO NAD EOMI NCAT  A 1. AoCKD3 likely related to R sided CHF, ? hypovolemia 2. Obesity / Hypoventilation 3. Anemia 4. Bradycardia 5. CHF, NICM, diastolic 6. Hx/o HTN 7. MOrbid Obesity 8. OSA not on CPAP 9. Rheumatoid Arthritis 10. HLD   P 1. Would cont hold IVFs but not restart diuretics 2. Daily weights, Daily Renal Panel, Strict I/Os, Avoid nephrotoxins (NSAIDs, judicious IV Contrast)   Pearson Grippe MD 09/21/2016, 11:08 AM   Recent Labs Lab 09/18/16 1607  09/19/16 1636 09/20/16 0453 09/21/16 0502  NA  --   < > 135  134* 135  K  --   < > 5.2* 4.8 4.6  CL  --   < > 94* 93* 95*  CO2  --   < > 24 24 29   GLUCOSE  --   < > 110* 85 113*  BUN  --   < > 102* 102* 91*  CREATININE  --   < > 4.42* 4.14* 3.19*  CALCIUM  --   < > 8.7* 8.5* 8.7*  PHOS 8.4*  --   --  6.9* 5.6*  < > = values in this interval not displayed.  Recent Labs Lab 09/18/16 1126  09/19/16 0634 09/20/16 0453 09/21/16 0502  WBC 7.6  --  5.8 6.2 6.1  NEUTROABS 6.3  --   --   --   --   HGB 7.3*  < > 7.2* 7.1* 7.0*  HCT 25.1*  < > 24.8* 23.6* 23.5*  MCV 88.7  --  87.9 87.1 88.7  PLT 194  --  224 248 197  < > = values in this interval not displayed.

## 2016-09-21 NOTE — Progress Notes (Signed)
ANTICOAGULATION CONSULT NOTE - Follow Up Consult  Pharmacy Consult for Heparin  Indication: Rule out PE  Allergies  Allergen Reactions  . Penicillins Hives, Itching, Swelling and Other (See Comments)    Tongue swelling Has patient had a PCN reaction causing immediate rash, facial/tongue/throat swelling, SOB or lightheadedness with hypotension: Yes  Clarified with the patient her penicillin allergy. Pt has tolerated cefepime in the past. Pt also states that she can take amoxicillin.    . Citrus Hives    Patient Measurements: Height: 5\' 7"  (170.2 cm) Weight: (!) 374 lb 1.9 oz (169.7 kg) IBW/kg (Calculated) : 61.6  Vital Signs: Temp: 98.2 F (36.8 C) (12/11 2220) Temp Source: Oral (12/11 2220) BP: 100/49 (12/11 2220) Pulse Rate: 69 (12/11 2220)  Labs:  Recent Labs  09/18/16 1126  09/18/16 1933 09/18/16 2357  09/19/16 0634  09/19/16 1636 09/20/16 0453 09/20/16 1323 09/20/16 2316  HGB 7.3*  < > 7.6*  --   --  7.2*  --   --  7.1*  --   --   HCT 25.1*  < > 25.8*  --   --  24.8*  --   --  23.6*  --   --   PLT 194  --   --   --   --  224  --   --  248  --   --   HEPARINUNFRC  --   --   --   --   < >  --   < > 0.33 0.19* 0.32 0.26*  CREATININE 3.71*  --  4.09*  --   --  4.37*  --  4.42* 4.14*  --   --   TROPONINI  --   --  <0.03 0.03*  --  0.03*  --   --   --   --   --   < > = values in this interval not displayed.  Estimated Creatinine Clearance: 24.5 mL/min (by C-G formula based on SCr of 4.14 mg/dL (H)).   Assessment: 60 y/o F on heparin for rule out PE, ?are we getting VQ at some point, noted renal dysfunction, heparin level is low tonight, no issues per RN.   Goal of Therapy:  Heparin level 0.3-0.7 units/ml Monitor platelets by anticoagulation protocol: Yes   Plan:  -Inc heparin to 2350 units/hr -0800 HL  Narda Bonds 09/21/2016,12:26 AM

## 2016-09-21 NOTE — Progress Notes (Signed)
Patient Name: Paige Marshall Date of Encounter: 09/21/2016  Primary Cardiologist: Dr. 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 She has a history Carmi 01/2015: normal cors, EF 45-50%.  She was admitted on 12/9 with increasing respiratory failure.  Cardiology consulted for treatment of bradycardia and hypotension.  - HR stabilized in 50s, remains hypotensive, but not on pressors. 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 bisacodyl  10 mg Rectal Once  . ceFEPime (MAXIPIME) IV  1 g Intravenous Q24H  . docusate sodium  100 mg Oral BID  . furosemide  40 mg Intravenous Once  . mouth rinse  15 mL Mouth Rinse BID  . omega-3 acid ethyl esters  1 g Oral BID  . polyethylene glycol  17 g Oral BID  . sodium chloride flush  3 mL Intravenous Q12H   Continuous Infusions: . sodium chloride 10 mL/hr at 09/21/16 0600  .  heparin 2,350 Units/hr (09/21/16 0600)   PRN Meds: Place/Maintain arterial line **AND** sodium chloride, albuterol, atropine, hydrALAZINE, ondansetron **OR** ondansetron (ZOFRAN) IV, oxyCODONE   Vital Signs    Vitals:   09/20/16 1500 09/20/16 2009 09/20/16 2220 09/21/16 0339  BP: 107/77  (!) 100/49 93/71  Pulse: 69 68 69 72  Resp: 14 15 14 16   Temp: 98.6 F (37 C)  98.2 F (36.8 C) 98 F (36.7 C)  TempSrc:   Oral Oral  SpO2: 98% 100% 96% 96%  Weight:    (!) 366 lb (166 kg)  Height:        Intake/Output Summary (Last 24 hours) at 09/21/16 1011 Last data filed at 09/21/16 0900  Gross per 24 hour  Intake          1243.98 ml  Output             1350 ml  Net          -106.02 ml   Filed Weights   09/19/16 0432 09/20/16 0400 09/21/16 0339  Weight: (!) 367 lb 4.6 oz (166.6 kg) (!) 374 lb 1.9 oz (169.7 kg) (!) 366 lb (166 kg)    Physical Exam    GEN: Well nourished, well developed; morbidly obese Neck: Supple, unable to assess JVD,  Cardiac: RRR -distant S1 and S2; No M/R/G. No clubbing, cyanosis,.  Radials/DP/PT 2+ and equal bilaterally.  Edema at least 2+ in the legs. SCDs in place. Respiratory:  Respirations regular and unlabored,  clear to auscultation bilaterally with fine extra wheezes. Generally reduced breath sounds throughout. GI: Soft, nontender, nondistended, BS + x 4. Obese Skin: warm and dry, no rash. Psych: AAOx3.  Normal affect.  Labs    CBC  Recent Labs  09/18/16 1126  09/20/16 0453 09/21/16 0502  WBC 7.6  < > 6.2 6.1  NEUTROABS 6.3  --   --   --   HGB 7.3*  < > 7.1* 7.0*  HCT 25.1*  < > 23.6* 23.5*  MCV 88.7  < > 87.1 88.7  PLT 194  < > 248 197  < > = values in this interval not displayed. Basic Metabolic Panel  Recent Labs  09/20/16 0453 09/21/16 0502  NA 134* 135  K 4.8 4.6  CL 93* 95*  CO2 24 29  GLUCOSE 85 113*  BUN 102* 91*  CREATININE 4.14* 3.19*  CALCIUM 8.5* 8.7*  MG 2.9* 2.7*  PHOS 6.9* 5.6*   Liver Function Tests  Recent  Labs  09/18/16 1126 09/19/16 0634 09/20/16 0453  AST 278* 130*  --   ALT 243* 212*  --   ALKPHOS 78 85  --   BILITOT 1.3* 1.3*  --   PROT 6.2* 6.1*  --   ALBUMIN 2.8* 2.8* 2.7*   No results for input(s): LIPASE, AMYLASE in the last 72 hours. Cardiac Enzymes  Recent Labs  09/18/16 1933 09/18/16 2357 09/19/16 0634  TROPONINI <0.03 0.03* 0.03*    Telemetry    Sinus rhythm in 60s - Personally Reviewed  ECG    No EKG today  Radiology    No results found.  Cardiac Studies    Echo 12/10: Mod LVH. EF ~50-55%. No RWMA. Septum with diastolic flattening - c/w increase  RV pressures (estimated Peak PAP ~46 mmHg). Mod RV dilation & mildly reduced RV Fxn (borderline Cor Pulmonale)  LE Venous Dopplers 12/10: no evidence of DVT or Superficial Venous Thrombosis. R Baker's cyst.  Assessment & Plan  Principal Problem:   Acute respiratory failure with hypoxemia (HCC) - multifactorial, but suspect that a major portion is related to OSA/OHS.  PCCM treating for possible PNA, de-escalating Rx    Obesity hypoventilation syndrome (HCC) - per PCCM, Bedtime BiPAP   Pulmonary hypertension - chronic,but concern for PE given presentation: on Heparin gtt   No DVT on dopplers, but Echo findings are somewhat concerning with septal flattening (suspect that Peak PA pressures are underestimated  If PE is identified, would recommend IVC filter. --> Consider VQ scan since CT scan with contrast contraindicated with renal insufficiency; unfortunately, VQ scan will be sub-optimal with her body habitus & OSH.    Acute on chronic diastolic CHF (congestive heart failure) ([place]) - complicated by initial hypotension/shock subsequently resolved  Seems to be intravascularly dry / anemic with pre-renal failure, but total body hypervolemic -- defer diuretics to Nephrology    Anemia - Hrg of ~7, down from 10.4 on Nov 18; agree with transfusion at least 1 unit (apparently plan is not transfusion until less than  7) -- I suspect that by increasing blood volume that her pressures will stabilize and potentially even the help her renal function. May help with recruitment of third spaced volume.  Agree that until source is identified, would not restart long term AC for AF      PAF (paroxysmal atrial fibrillation) (HCC) -- Bradycardia - stable HR now, off BB & CCB  Will need to monitor during BB washout for RVR; no BP room to  restart low dose BB  Not on AC despite CHA2DS2Vasc of 3 b/c anemia & concern for bleed.  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

## 2016-09-21 NOTE — Plan of Care (Signed)
Problem: Activity: Goal: Risk for activity intolerance will decrease Outcome: Not Progressing Patient states that she has not been able to get out of bed since she broke her leg, non weight bearing.    Problem: Bowel/Gastric: Goal: Will not experience complications related to bowel motility Outcome: Progressing Patient had several bowel movements overnight

## 2016-09-21 NOTE — Progress Notes (Signed)
PROGRESS NOTE        PATIENT DETAILS Name: Paige Marshall Age: 60 y.o. Sex: female Date of Birth: August 29, 1956 Admit Date: 09/18/2016 Admitting Physician Elwin Mocha, MD [id number], [place]ford Cage, NP  Brief Narrative: Patient is a 60 y.o. female with history of chronic kidney disease stage III,NICM with EF 40-45% in April 2016, COPD on nocturnal O2 admitted on 12/9 for evaluation of acute on chronic hypoxemic/hypercarbic respiratory failure along with hypotension and bradycardia. Patient was found to be in acute on chronic renal failure stage III. Nephrology and cardiology were consulted. With supportive measures, patient improved, and was transferred to the [facility] on 12/12.  Subjective: Awake-alert.  Denies any chest pain or shortness of breath.  Assessment/Plan: Acute on chronic hypoxic and hypercarbic respiratory failure: Multifactorial-suspected etiology obesity hypoventilation syndrome, acute diastolic failure,possible HCAP. Doubt venous thromboembolism-lower extremity Dopplers negative for DVT-currently on IV heparin-does have history of paroxysmal atrial fibrillation-will discuss with cardiology to see if patient is a long-term candidate for anticoagulation-if so-week and forego further workup including a VQ scan. Continue BiPAP at bedtime.  Acute on chronic diastolic heart failure: Currently relatively well compensated-not on diuretics-as hospital course complicated by hypotension/shock. Cardiology following and directing care.  Shock/hypotension: Probably a combination of hypovolemic shock/medications-blood pressure is now stable. Follow.  Sinus bradycardia: Felt to be secondary to medications-continue to monitor and telemetry-holding Cardizem, Coreg and Rythmol.  History of paroxysmal atrial fibrillation:CHA2DS2Vasc of 3-on IV heparin. Not on any rate control agents given bradycardia on admission. Cardiology following.  Acute  on chronic kidney disease stage III: Per nephrology-likely related to hypovolemia and low flow state due to CHF. Creatinine down trending with supportive care.  Anemia: Has anemia related to chronic kidney disease-suspect worsening anemia due to acute illness. No evidence of overt GI bleeding at this time. Anemia panel consistent chronic disease-since hemoglobin down to 7-will go and transfuse 1 unit of PRBC.  ?HCAP: Afebrile-no leukocytosis-previously on broad-spectrum antibiotics-now just on cefepime-we'll plan a total of 5 days of antimicrobial therapy.   Elevated liver enzymes: Stable shock liver due to hypotension-avoid hepatotoxic agents-repeat LFTs and follow. RUQ ultrasound does show cholelithiasis-but abdomen exam is benign. Plans are to follow for now.  COPD: Stable-no evidence of exacerbation-continue bronchodilators. On nocturnal oxygen at home.  Obesity hypoventilation syndrome: Continue BiPAP daily at bedtime.  Hx of Left fibular fracture: per most recent d/c summary on 12/5-per orthopedics (Dr Erlinda Hong) not a surgical candidate due to her chronic respiratory failure. Recommendations were to be non weight bearing for 4-6 weeks, and must be in CAM walker for 4-6 weeks.  Physical deconditioning/debility: Suspect will require SNF placement-on discharge. PT/OT eval  DVT Prophylaxis: Full dose anticoagulation with Heparin  Code Status: Full code  Family Communication: None at bedside  Disposition Plan: Remain inpatient-but will plan on SNF on discharge-probably by the end of the week  Antimicrobial agents: See below  Procedures: Echo 12/10>>EF 50-55%,RV mod dilated, PA pressure 49 mmHg  CONSULTS:  cardiology, pulmonary/intensive care and nephrology  Time spent: 25- minutes-Greater than 50% of this time was spent in counseling, explanation of diagnosis, planning of further management, and coordination of care.  MEDICATIONS: Anti-infectives    Start     Dose/Rate Route  Frequency Ordered Stop   09/19/16 1315  ceFEPIme (MAXIPIME) 1 g in dextrose 5 % 50 mL IVPB  1 g 100 mL/hr over 30 Minutes Intravenous Every 24 hours 09/19/16 1302     09/18/16 2000  vancomycin (VANCOCIN) 2,000 mg in sodium chloride 0.9 % 500 mL IVPB  Status:  Discontinued     2,000 mg 250 mL/hr over 120 Minutes Intravenous Every 48 hours 09/18/16 1941 09/19/16 1320   09/18/16 1945  aztreonam (AZACTAM) 1 g in dextrose 5 % 50 mL IVPB  Status:  Discontinued     1 g 100 mL/hr over 30 Minutes Intravenous Every 8 hours 09/18/16 1940 09/19/16 1302      Scheduled Meds: . bisacodyl  10 mg Rectal Once  . ceFEPime (MAXIPIME) IV  1 g Intravenous Q24H  . docusate sodium  100 mg Oral BID  . furosemide  40 mg Intravenous Once  . mouth rinse  15 mL Mouth Rinse BID  . omega-3 acid ethyl esters  1 g Oral BID  . polyethylene glycol  17 g Oral BID  . sodium chloride flush  3 mL Intravenous Q12H   Continuous Infusions: . sodium chloride 10 mL/hr at 09/21/16 0600  . heparin 2,350 Units/hr (09/21/16 0600)   PRN Meds:.Place/Maintain arterial line **AND** sodium chloride, albuterol, atropine, hydrALAZINE, ondansetron **OR** ondansetron (ZOFRAN) IV, oxyCODONE   PHYSICAL EXAM: Vital signs: Vitals:   09/20/16 2009 09/20/16 2220 09/21/16 0339 09/21/16 1415  BP:  (!) 100/49 93/71 102/72  Pulse: 68 69 72 70  Resp: 15 14 16 15   Temp:  98.2 F (36.8 C) 98 F (36.7 C) 98 F (36.7 C)  TempSrc:  Oral Oral Oral  SpO2: 100% 96% 96% 97%  Weight:   (!) 166 kg (366 lb)   Height:       Filed Weights   09/19/16 0432 09/20/16 0400 09/21/16 0339  Weight: (!) 166.6 kg (367 lb 4.6 oz) (!) 169.7 kg (374 lb 1.9 oz) (!) 166 kg (366 lb)   Body mass index is 57.32 kg/m.   General appearance :Awake, alert, not in any distress.  Eyes:, pupils equally reactive to light and accomodation,no scleral icterus. HEENT: Atraumatic and Normocephalic Neck: supple, no JVD. No cervical lymphadenopathy. Resp:Good air entry  bilaterally, no added sounds  CVS: S1 S2 regular GI: Bowel sounds present, Non tender and not distended with no gaurding, rigidity or rebound.No organomegaly. Obese abdomen Extremities: B/L Lower Ext shows trace edema, both legs are warm to touch Neurology:  speech clear,Non focal, sensation is grossly intact. Psychiatric: Normal judgment and insight.  Musculoskeletal:No digital cyanosis Skin:No Rash, warm and dry Wounds:N/A  I have personally reviewed following labs and imaging studies  LABORATORY DATA: CBC:  Recent Labs Lab 09/18/16 1126 09/18/16 1607 09/18/16 1933 09/19/16 0634 09/20/16 0453 09/21/16 0502  WBC 7.6  --   --  5.8 6.2 6.1  NEUTROABS 6.3  --   --   --   --   --   HGB 7.3* 7.7* 7.6* 7.2* 7.1* 7.0*  HCT 25.1* 26.3* 25.8* 24.8* 23.6* 23.5*  MCV 88.7  --   --  87.9 87.1 88.7  PLT 194  --   --  224 248 [id number]    Basic Metabolic Panel:  Recent Labs Lab 09/18/16 1607 09/18/16 1933 09/19/16 0634 09/19/16 1636 09/20/16 0453 09/21/16 0502  NA  --  129* 132* 135 134* 135  K  --  5.4* 5.0 5.2* 4.8 4.6  CL  --  88* 90* 94* 93* 95*  CO2  --  28 29 24 24 29   GLUCOSE  --  100*  90 110* 85 113*  BUN  --  94* 99* 102* 102* 91*  CREATININE  --  4.09* 4.37* 4.42* 4.14* 3.19*  CALCIUM  --  8.7* 8.8* 8.7* 8.5* 8.7*  MG 3.1*  --   --   --  2.9* 2.7*  PHOS 8.4*  --   --   --  6.9* 5.6*    GFR: Estimated Creatinine Clearance: 31.4 mL/min (by C-G formula based on SCr of 3.19 mg/dL (H)).  Liver Function Tests:  Recent Labs Lab 09/18/16 1126 09/19/16 0634 09/20/16 0453  AST 278* 130*  --   ALT 243* 212*  --   ALKPHOS 78 85  --   BILITOT 1.3* 1.3*  --   PROT 6.2* 6.1*  --   ALBUMIN 2.8* 2.8* 2.7*   No results for input(s): LIPASE, AMYLASE in the last 168 hours. No results for input(s): AMMONIA in the last 168 hours.  Coagulation Profile: No results for input(s): INR, PROTIME in the last 168 hours.  Cardiac Enzymes:  Recent Labs Lab 09/18/16 1933  09/18/16 2357 09/19/16 0634  TROPONINI <0.03 0.03* 0.03*    BNP (last 3 results) No results for input(s): PROBNP in the last 8760 hours.  HbA1C: No results for input(s): HGBA1C in the last 72 hours.  CBG: No results for input(s): GLUCAP in the last 168 hours.  Lipid Profile: No results for input(s): CHOL, HDL, LDLCALC, TRIG, CHOLHDL, LDLDIRECT in the last 72 hours.  Thyroid Function Tests: No results for input(s): TSH, T4TOTAL, FREET4, T3FREE, THYROIDAB in the last 72 hours.  Anemia Panel:  Recent Labs  09/21/16 0834  VITAMINB12 1,220*  FOLATE 18.7  FERRITIN 317*  TIBC 301  IRON 29  RETICCTPCT 5.6*    Urine analysis:    Component Value Date/Time   COLORURINE YELLOW 09/13/2016 0225   APPEARANCEUR CLEAR 09/13/2016 0225   LABSPEC 1.017 09/13/2016 0225   PHURINE 5.0 09/13/2016 0225   GLUCOSEU NEGATIVE 09/13/2016 0225   HGBUR NEGATIVE 09/13/2016 0225   BILIRUBINUR NEGATIVE 09/13/2016 0225   KETONESUR NEGATIVE 09/13/2016 0225   PROTEINUR NEGATIVE 09/13/2016 0225   UROBILINOGEN 1.0 11/16/2009 0554   NITRITE NEGATIVE 09/13/2016 0225   LEUKOCYTESUR NEGATIVE 09/13/2016 0225    Sepsis Labs: Lactic Acid, Venous    Component Value Date/Time   LATICACIDVEN 1.8 09/18/2016 1933    MICROBIOLOGY: Recent Results (from the past 240 hour(s))  MRSA PCR Screening     Status: None   Collection Time: 09/18/16  3:17 PM  Result Value Ref Range Status   MRSA by PCR NEGATIVE NEGATIVE Final    Comment:        The GeneXpert MRSA Assay (FDA approved for NASAL specimens only), is one component of a comprehensive MRSA colonization surveillance program. It is not intended to diagnose MRSA infection nor to guide or monitor treatment for MRSA infections.     RADIOLOGY STUDIES/RESULTS: Dg Chest 2 View  Result Date: 09/05/2016 CLINICAL DATA:  Shortness of Breath EXAM: CHEST  2 VIEW COMPARISON:  08/28/2016 FINDINGS: Cardiac shadow is again enlarged in size. Aortic  calcifications are stable. The lungs are well aerated bilaterally. No focal infiltrate or sizable effusion is seen. No acute bony abnormality is noted. IMPRESSION: No acute abnormality noted. Electronically Signed   By: Inez Catalina M.D.   On: 09/05/2016 15:34   Dg Chest 2 View  Result Date: 08/28/2016 CLINICAL DATA:  Dyspnea today. EXAM: CHEST  2 VIEW COMPARISON:  04/04/2016 FINDINGS: There is marked cardiomegaly, unchanged. There is  mild vascular fullness, unchanged and likely chronic. No airspace consolidation. No effusions. No pneumothorax. Hilar and mediastinal contours are unremarkable and unchanged. IMPRESSION: Stable cardiomegaly.  No consolidation or effusion. Electronically Signed   By: Andreas Newport M.D.   On: 08/28/2016 21:52   Dg Ankle Complete Left  Result Date: 08/30/2016 CLINICAL DATA:  Fall 2 days ago.  Left ankle pain. EXAM: LEFT ANKLE COMPLETE - 3+ VIEW COMPARISON:  None. FINDINGS: There is an oblique fracture through the distal right fibula, minimally displaced. No visible tibial abnormality. Diffuse soft tissue swelling. Ankle mortise appears intact. IMPRESSION: Oblique minimally displaced fracture through the distal left fibula. Electronically Signed   By: Rolm Baptise M.D.   On: 08/30/2016 13:34   US Renal  Result Date: 09/13/2016 CLINICAL DATA:  Acute kidney injury EXAM: RENAL / URINARY TRACT ULTRASOUND COMPLETE COMPARISON:  03/08/2016 FINDINGS: Right Kidney: Length: 12.8 cm. Echogenicity within normal limits. No mass or hydronephrosis visualized. Left Kidney: Length: 11.6 cm. Echogenicity within normal limits. No mass or hydronephrosis visualized. Bladder: Limited visualization. Appears normal for degree of bladder distention. IMPRESSION: Negative renal ultrasound. Electronically Signed   By: Monte Fantasia M.D.   On: 09/13/2016 10:25   Dg Chest Portable 1 View  Result Date: 09/18/2016 CLINICAL DATA:  Patient with chest pain and pressure. EXAM: PORTABLE CHEST 1 VIEW  COMPARISON:  Chest radiograph 09/05/2016. FINDINGS: Monitoring leads overlie the patient. Stable cardiomegaly. Minimal heterogeneous opacities lung bases bilaterally. No pleural effusion or pneumothorax. IMPRESSION: Cardiomegaly.  Basilar opacities favored to represent atelectasis. Electronically Signed   By: Lovey Newcomer M.D.   On: 09/18/2016 12:12   Dg Knee Complete 4 Views Left  Result Date: 08/29/2016 CLINICAL DATA:  Pain after fall. EXAM: LEFT KNEE - COMPLETE 4+ VIEW COMPARISON:  None. FINDINGS: There is a moderate suprapatellar joint effusion. Severe tricompartmental degenerative changes are noted. No fractures are seen. IMPRESSION: Moderate suprapatellar effusion with no visualized fracture. Electronically Signed   By: Dorise Bullion III M.D   On: 08/29/2016 01:37   US Abdomen Limited Ruq  Result Date: 09/18/2016 CLINICAL DATA:  Elevated liver function tests EXAM: US ABDOMEN LIMITED - RIGHT UPPER QUADRANT COMPARISON:  03/08/2016 FINDINGS: Gallbladder: Well distended with cholelithiasis. Gallbladder wall thickening till 1 cm is noted. Negative sonographic Murphy's sign was elicited. Common bile duct: Diameter: 6.6 mm. Liver: No focal lesion identified. Within normal limits in parenchymal echogenicity. Minimal free fluid is noted. This could account for the changes in the gallbladder wall. IMPRESSION: Cholelithiasis. Thickened gallbladder wall without sonographic Murphy sign. This could still be indicative of acute cholecystitis. Clinical correlation is recommended. Electronically Signed   By: Inez Catalina M.D.   On: 09/18/2016 20:56     LOS: 3 days   Oren Binet, MD  [facility] Pager:[phone]  If 7PM-7AM, please contact night-coverage [email or web address] Password [facility] 09/21/2016, 2:58 PM

## 2016-09-22 DIAGNOSIS — N182 Chronic kidney disease, stage 2 (mild): Secondary | ICD-10-CM

## 2016-09-22 LAB — RENAL FUNCTION PANEL
ANION GAP: 9 (ref 5–15)
Albumin: 2.8 g/dL — ABNORMAL LOW (ref 3.5–5.0)
BUN: 77 mg/dL — ABNORMAL HIGH (ref 6–20)
CHLORIDE: 100 mmol/L — AB (ref 101–111)
CO2: 29 mmol/L (ref 22–32)
CREATININE: 2.47 mg/dL — AB (ref 0.44–1.00)
Calcium: 8.9 mg/dL (ref 8.9–10.3)
GFR, EST AFRICAN AMERICAN: 24 mL/min — AB (ref >60)
GFR, EST NON AFRICAN AMERICAN: 20 mL/min — AB (ref >60)
Glucose, Bld: 91 mg/dL (ref 65–99)
POTASSIUM: 4.3 mmol/L (ref 3.5–5.1)
Phosphorus: 4.6 mg/dL (ref 2.5–4.6)
Sodium: 138 mmol/L (ref 135–145)

## 2016-09-22 LAB — PROTIME-INR
INR: 1.11
Prothrombin Time: 14.4 seconds (ref 11.4–15.2)

## 2016-09-22 LAB — TYPE AND SCREEN
BLOOD PRODUCT EXPIRATION DATE: 201712192359
ISSUE DATE / TIME: 201712121827
Unit Type and Rh: 6200

## 2016-09-22 LAB — CBC
HCT: 24.7 % — ABNORMAL LOW (ref 36.0–46.0)
HEMATOCRIT: 24.9 % — AB (ref 36.0–46.0)
Hemoglobin: 7.3 g/dL — ABNORMAL LOW (ref 12.0–15.0)
Hemoglobin: 7.3 g/dL — ABNORMAL LOW (ref 12.0–15.0)
MCH: 25.9 pg — ABNORMAL LOW (ref 26.0–34.0)
MCH: 25.8 pg — AB (ref 26.0–34.0)
MCHC: 29.3 g/dL — ABNORMAL LOW (ref 30.0–36.0)
MCHC: 29.6 g/dL — AB (ref 30.0–36.0)
MCV: 87.6 fL (ref 78.0–100.0)
MCV: 88 fL (ref 78.0–100.0)
Platelets: 139 10*3/uL — ABNORMAL LOW (ref 150–400)
Platelets: 151 10*3/uL (ref 150–400)
RBC: 2.82 MIL/uL — ABNORMAL LOW (ref 3.87–5.11)
RBC: 2.83 MIL/uL — ABNORMAL LOW (ref 3.87–5.11)
RDW: 19.8 % — ABNORMAL HIGH (ref 11.5–15.5)
RDW: 20.2 % — AB (ref 11.5–15.5)
WBC: 5.5 10*3/uL (ref 4.0–10.5)
WBC: 6 10*3/uL (ref 4.0–10.5)

## 2016-09-22 LAB — HEPATIC FUNCTION PANEL
ALBUMIN: 2.8 g/dL — AB (ref 3.5–5.0)
ALK PHOS: 62 U/L (ref 38–126)
ALT: 95 U/L — ABNORMAL HIGH (ref 14–54)
AST: 30 U/L (ref 15–41)
Bilirubin, Direct: 0.3 mg/dL (ref 0.1–0.5)
Indirect Bilirubin: 1 mg/dL — ABNORMAL HIGH (ref 0.3–0.9)
TOTAL PROTEIN: 6 g/dL — AB (ref 6.5–8.1)
Total Bilirubin: 1.3 mg/dL — ABNORMAL HIGH (ref 0.3–1.2)

## 2016-09-22 LAB — MAGNESIUM: MAGNESIUM: 2.7 mg/dL — AB (ref 1.7–2.4)

## 2016-09-22 LAB — HEPARIN LEVEL (UNFRACTIONATED): Heparin Unfractionated: 0.39 IU/mL (ref 0.30–0.70)

## 2016-09-22 MED ORDER — CARVEDILOL 6.25 MG PO TABS
6.2500 mg | ORAL_TABLET | Freq: Two times a day (BID) | ORAL | Status: DC
  Administered 2016-09-22 – 2016-09-23 (×3): 6.25 mg via ORAL
  Filled 2016-09-22 (×3): qty 1

## 2016-09-22 MED ORDER — WARFARIN VIDEO
Freq: Once | Status: AC
  Administered 2016-09-22: 20:00:00

## 2016-09-22 MED ORDER — FERROUS SULFATE 325 (65 FE) MG PO TABS
325.0000 mg | ORAL_TABLET | Freq: Three times a day (TID) | ORAL | Status: DC
  Administered 2016-09-22 – 2016-09-23 (×4): 325 mg via ORAL
  Filled 2016-09-22 (×4): qty 1

## 2016-09-22 MED ORDER — COUMADIN BOOK
Freq: Once | Status: AC
  Administered 2016-09-22: 20:00:00
  Filled 2016-09-22: qty 1

## 2016-09-22 MED ORDER — WARFARIN - PHARMACIST DOSING INPATIENT: Freq: Every day | Status: DC

## 2016-09-22 MED ORDER — WARFARIN SODIUM 10 MG PO TABS
10.0000 mg | ORAL_TABLET | Freq: Once | ORAL | Status: AC
  Administered 2016-09-22: 10 mg via ORAL
  Filled 2016-09-22: qty 1

## 2016-09-22 NOTE — Clinical Social Work Note (Signed)
Clinical Social Work Assessment  Patient Details  Name: Paige Marshall MRN: [id number] Date of Birth: Jan 03, 1956  Date of referral:  09/22/16               Reason for consult:  Facility Placement                Permission sought to share information with:  Family Supports Permission granted to share information::  Yes, Verbal Permission Granted  Name::     Paige Marshall  Relationship::  son  Contact Information:  [phone]  Housing/Transportation Living arrangements for the past 2 months:  [place], [place] of Information:  Patient Patient Interpreter Needed:  None Criminal Activity/Legal Involvement Pertinent to Current Situation/Hospitalization:  No - Comment as needed Significant Relationships:  Adult Children Lives with:   Facility Resident Do you feel safe going back to the place where you live?  Yes Need for family participation in patient care:  No (Coment)  Care giving concerns:  No care giving concerns identified.   Social Worker assessment / plan:  CSW met with pt to address consult. CSW introduced herself and explained role of social work. CSW also explained the process of discharging and returning to SNF. Pt is patient at [facility]. Pt reports that she has only been there about a week. Pt is agreeable to returning to facility at discharge. Per facility pt is able to return when stable. CSW completed FL2. CSW will continue to follow.   Employment status:  Disabled (Comment on whether or not currently receiving Disability) Insurance information:  Medicare PT Recommendations:  [place] / Referral to community resources:  Other (Comment Required) Engineer, water)  Patient/Family's Response to care:  Pt was appreciative of CSW support.   Patient/Family's Understanding of and Emotional Response to Diagnosis, Current Treatment, and Prognosis: Pt understands that she is to return to facility  at discharge.   Emotional Assessment Appearance:  Appears stated age Attitude/Demeanor/Rapport:  Other (Appropriate) Affect (typically observed):  Accepting, Adaptable, Pleasant Orientation:  Oriented to Self, Oriented to Place, Oriented to  Time, Oriented to Situation Alcohol / Substance use:  Not Applicable Psych involvement (Current and /or in the community):  No (Comment)  Discharge Needs  Concerns to be addressed:  No discharge needs identified Readmission within the last 30 days:  Yes Current discharge risk:  Chronically ill Barriers to Discharge:  Continued Medical Work up   Darden Dates, LCSW 09/22/2016, 3:39 PM

## 2016-09-22 NOTE — NC FL2 (Signed)
[place] LEVEL OF CARE SCREENING TOOL     IDENTIFICATION  Patient Name: Paige Marshall Birthdate: [birth year] Sex: female Admission Date (Current Location): 09/18/2016  [facility] and [place] Number:  Herbalist and Address:  [facility] [place], [place] [address] [place], [place] [address]      Provider Number: [id number]  Attending Physician Name and Address:  Jonetta Osgood, MD  Relative Name and Phone Number:       Current Level of Care: Hospital Recommended Level of Care: [place] Prior Approval Number:    Date Approved/Denied:   PASRR Number: [id number]  Discharge Plan: SNF    Current Diagnoses: Patient Active Problem List   Diagnosis Date Noted  . Acute renal failure superimposed on chronic kidney disease ([place])   . Acute respiratory failure with hypoxemia ([place]) 09/18/2016  . Orthostatic hypotension   . Bradycardia   . Acute on chronic combined systolic and diastolic CHF (congestive heart failure) ([place])   . Anemia 09/06/2016  . Left fibular fracture 09/06/2016  . SOB (shortness of breath)   . Acute kidney injury superimposed on CKD ([place]) 08/31/2016  . NICM (nonischemic cardiomyopathy) ([place]) 08/31/2016  . Pulmonary hypertension 08/31/2016  . Right heart failure, NYHA class 3 08/31/2016  . Acute on chronic diastolic CHF (congestive heart failure) ([place]) 08/29/2016  . 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 Syncope   . AKI (acute kidney injury) ([place]) 03/08/2016  . Acute on chronic renal failure ([place]) 03/08/2016  . Chronic respiratory failure with hypoxia ([place]) 10/24/2015  . COPD mixed type ([place]) 10/10/2015  . 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 Uncontrolled hypertension   . Obesity hypoventilation syndrome ([place]) 01/14/2015  . Obstructive sleep apnea 09/28/2013  . Morbid obesity ([place]) 09/28/2013  .  PAF (paroxysmal atrial fibrillation) ([place]) 01/09/2010  . Dyslipidemia 10/17/2006  . Essential hypertension 10/17/2006  . VENTRICULAR HYPERTROPHY, LEFT 10/17/2006  . DYSFUNCTIONAL UTERINE BLEEDING 10/17/2006    Orientation RESPIRATION BLADDER Height & Weight     Self, Time, Situation, Place  O2 (3L) Continent Weight: (!) 390 lb (176.9 kg) Height:  5\' 7"  (170.2 cm)  BEHAVIORAL SYMPTOMS/MOOD NEUROLOGICAL BOWEL NUTRITION STATUS      Continent Diet (Renal Diet with Fluid Restriction, Thin Liquids)  AMBULATORY STATUS COMMUNICATION OF NEEDS Skin   Extensive Assist Verbally Normal                       Personal Care Assistance Level of Assistance  Bathing, Feeding, Dressing Bathing Assistance: Limited assistance Feeding assistance: Independent Dressing Assistance: Limited assistance     Functional Limitations Info  Sight, Hearing, Speech Sight Info: Adequate Hearing Info: Adequate Speech Info: Adequate    SPECIAL CARE FACTORS FREQUENCY  PT (By licensed PT), OT (By licensed OT)     PT Frequency: 5 OT Frequency: 5            Contractures Contractures Info: Not present    Additional Factors Info  Code Status, Allergies Code Status Info: Full Code Allergies Info: Penicillins, Citrus           Current Medications (09/22/2016):  This is the current hospital active medication list Current Facility-Administered Medications  Medication Dose Route Frequency Provider Last Rate Last Dose  . 0.9 %  sodium chloride infusion   Intra-arterial PRN Melvenia Needles, NP      .  0.9 %  sodium chloride infusion   Intravenous Continuous Rush Farmer, MD 10 mL/hr at 09/21/16 0600    . albuterol (PROVENTIL) (2.5 MG/3ML) 0.083% nebulizer solution 2.5 mg  2.5 mg Nebulization Q2H PRN Elwin Mocha, MD      . atropine 1 MG/10ML injection 0.5 mg  0.5 mg Intravenous PRN Elwin Mocha, MD      . bisacodyl (DULCOLAX) suppository 10 mg  10 mg Rectal Once Anders Simmonds, MD      .  carvedilol (COREG) tablet 6.25 mg  6.25 mg Oral BID WC Leonie Man, MD      . ceFEPIme (MAXIPIME) 1 g in dextrose 5 % 50 mL IVPB  1 g Intravenous Q24H Brand Males, MD   1 g at 09/21/16 1338  . docusate sodium (COLACE) capsule 100 mg  100 mg Oral BID Elwin Mocha, MD   100 mg at 09/22/16 [id number]  . ferrous sulfate tablet 325 mg  325 mg Oral TID WC Rexene Agent, MD      . heparin ADULT infusion 100 units/mL (25000 units/273mL sodium chloride 0.45%)  2,350 Units/hr Intravenous Continuous Erenest Blank, RPH 23.5 mL/hr at 09/22/16 0545 2,350 Units/hr at 09/22/16 0545  . hydrALAZINE (APRESOLINE) injection 10 mg  10 mg Intravenous Q8H PRN Elwin Mocha, MD      . MEDLINE mouth rinse  15 mL Mouth Rinse BID Javier Glazier, MD   15 mL at 09/21/16 0945  . omega-3 acid ethyl esters (LOVAZA) capsule 1 g  1 g Oral BID Elwin Mocha, MD   1 g at 09/22/16 0807  . ondansetron (ZOFRAN) tablet 4 mg  4 mg Oral Q6H PRN Elwin Mocha, MD       Or  . ondansetron [facility]) injection 4 mg  4 mg Intravenous Q6H PRN Elwin Mocha, MD      . oxyCODONE (Oxy IR/ROXICODONE) immediate release tablet 5 mg  5 mg Oral Q6H PRN Anders Simmonds, MD   5 mg at 09/22/16 [id number]  . polyethylene glycol (MIRALAX / GLYCOLAX) packet 17 g  17 g Oral Daily PRN Jonetta Osgood, MD      . sodium chloride flush (NS) 0.9 % injection 3 mL  3 mL Intravenous Q12H Elwin Mocha, MD   Stopped at 09/21/16 2022     Discharge Medications: Please see discharge summary for a list of discharge medications.  Relevant Imaging Results:  Relevant Lab Results:   Additional Information SSN:  [id number]  Darden Dates, LCSW

## 2016-09-22 NOTE — Care Management Important Message (Signed)
Important Message  Patient Details  Name: Paige Marshall MRN: [id number] Date of Birth: 1956-05-31   Medicare Important Message Given:  Yes    Nathen May 09/22/2016, 10:23 AM

## 2016-09-22 NOTE — Evaluation (Signed)
Physical Therapy Evaluation Patient Details Name: Paige Marshall MRN: [id number] DOB: 06/16/1956 Today's Date: 09/22/2016   History of Present Illness  60 y.o. female with medical history significant of NICM with EF 40-45% in April 2016, CKD stage 3 at baseline, PAF, HTN, morbid obesity, mixed type COPD.  At baseline she uses O2 only at night, unable to tolerate CPAP.  She was admitted from 11/18-21 for syncope, right heart failure with NYHA class 3, PAF, morbid obesity, left fibular fracture, and acute on chronic respiratory failure.  She was recommended for SNF but she declined placement. Readmitted 11/26 to 12/5 for similar with acute on chronic hypoxic resp failure .  That improved with diuresis . She was discharged to rehab as she was considered not a surgical candidate and recommended for cam walker w/ no rehab for 4-6 weeks. . She returned to ER on 12/9 for increased sob and dry cough , felt she was feeling back up with fluid. Pt hypotensive and bradycardic on admission  Clinical Impression  Pt admitted with above diagnosis. Pt currently with functional limitations due to the deficits listed below (see PT Problem List). Pt very low functioning at this point due to NWB LLE and generalized weakness. Attempted sit to stand 3x with Clarise Cruz lift and pt unable to clear buttocks, will need maximove for out of bed. Will work on lateral scoot transfers to chair if pt can tolerate.  Pt will benefit from skilled PT to increase their independence and safety with mobility to allow discharge to the venue listed below.       Follow Up Recommendations SNF;Supervision/Assistance - 24 hour    Equipment Recommendations  None recommended by PT    Recommendations for Other Services       Precautions / Restrictions Precautions Precautions: Fall Required Braces or Orthoses: Other Brace/Splint Other Brace/Splint: CAM boot (not present on eval, sister to bring) Restrictions Weight Bearing Restrictions:  Yes LLE Weight Bearing: Non weight bearing      Mobility  Bed Mobility Overal bed mobility: Needs Assistance Bed Mobility: Supine to Sit;Sit to Supine Rolling: +2 for safety/equipment;Min guard;Min assist   Supine to sit: Min assist Sit to supine: Min assist   General bed mobility comments: Min assist to manage LLE.  Transfers Overall transfer level: Needs assistance Equipment used: Ambulation equipment used             General transfer comment: Attempted sit to stand from EOB x4 with use of sara plus; pt unable to perform. Will need to use maxi move. May try lateral transfer with SB next visit  Ambulation/Gait             General Gait Details: unable  Stairs            Wheelchair Mobility    Modified Rankin (Stroke Patients Only)       Balance Overall balance assessment: Needs assistance Sitting-balance support: Feet supported;Bilateral upper extremity supported Sitting balance-Leahy Scale: Fair                                       Pertinent Vitals/Pain Pain Assessment: Faces Faces Pain Scale: Hurts little more Pain Location: LLE Pain Descriptors / Indicators: Grimacing Pain Intervention(s): Monitored during session    Home Living Family/patient expects to be discharged to:: Skilled nursing facility  Prior Function Level of Independence: Needs assistance   Gait / Transfers Assistance Needed: has been standing with a lift chair piece of equip at SNF   ADL's / Homemaking Assistance Needed: assist with ADL at bed level        Hand Dominance   Dominant Hand: Right    Extremity/Trunk Assessment   Upper Extremity Assessment Upper Extremity Assessment: Defer to OT evaluation;Generalized weakness (could not depress scapulae effectiveley to stand in [employer])    Lower Extremity Assessment Lower Extremity Assessment: Generalized weakness;RLE deficits/detail;LLE deficits/detail RLE Deficits /  Details: hip flex 2/5, knee ext 3/5, unable to push through RLE with sufficient strength to stand without use of LLE LLE Deficits / Details: hip flex 2/5, knee ext 3-/5    Cervical / Trunk Assessment Cervical / Trunk Assessment: Other exceptions Cervical / Trunk Exceptions: body habitus  Communication   Communication: No difficulties  Cognition Arousal/Alertness: Awake/alert Behavior During Therapy: WFL for tasks assessed/performed Overall Cognitive Status: Within Functional Limits for tasks assessed                      General Comments      Exercises General Exercises - Lower Extremity Ankle Circles/Pumps: AROM;Both;15 reps;Supine Quad Sets: AROM;Left;10 reps;Supine Hip ABduction/ADduction: AROM;Left;10 reps;Supine   Assessment/Plan    PT Assessment Patient needs continued PT services  PT Problem List Decreased strength;Decreased activity tolerance;Decreased mobility;Decreased balance;Pain;Obesity;Cardiopulmonary status limiting activity          PT Treatment Interventions Functional mobility training;Therapeutic exercise;Therapeutic activities;Patient/family education    PT Goals (Current goals can be found in the Care Plan section)  Acute Rehab PT Goals Patient Stated Goal: return to rehab PT Goal Formulation: With patient Time For Goal Achievement: 10/06/16 Potential to Achieve Goals: Fair    Frequency Min 2X/week   Barriers to discharge Decreased caregiver support;Inaccessible home environment      Co-evaluation PT/OT/SLP Co-Evaluation/Treatment: Yes Reason for Co-Treatment: Complexity of the patient's impairments (multi-system involvement);For patient/therapist safety PT goals addressed during session: Mobility/safety with mobility;Balance;Proper use of DME;Strengthening/ROM OT goals addressed during session: ADL's and self-care       End of Session Equipment Utilized During Treatment: Oxygen;Gait belt Activity Tolerance: Patient limited by  fatigue Patient left: in bed;with call bell/phone within reach Nurse Communication: Need for lift equipment         Time: [id number] PT Time Calculation (min) (ACUTE ONLY): 50 min   Charges:   PT Evaluation $PT Eval Moderate Complexity: 1 Procedure     PT G Codes:      Leighton Roach, PT  Acute Rehab Services  [place] 09/22/2016, 1:54 PM

## 2016-09-22 NOTE — Progress Notes (Signed)
ANTICOAGULATION CONSULT NOTE - Follow Up Consult  Pharmacy Consult for heparin > coumadin Indication: rule out pulmonary embolus, afib  Allergies  Allergen Reactions  . Penicillins Hives, Itching, Swelling and Other (See Comments)    Tongue swelling Has patient had a PCN reaction causing immediate rash, facial/tongue/throat swelling, SOB or lightheadedness with hypotension: Yes  Clarified with the patient her penicillin allergy. Pt has tolerated cefepime in the past. Pt also states that she can take amoxicillin.    . Citrus Hives    Patient Measurements: Height: 5\' 7"  (170.2 cm) Weight: (!) 390 lb (176.9 kg) IBW/kg (Calculated) : 61.6 Heparin Dosing Weight: 102.4 kg  Vital Signs: Temp: 98.2 F (36.8 C) (12/13 1236) Temp Source: Oral (12/13 1236) BP: 137/84 (12/13 1236) Pulse Rate: 80 (12/13 1236)  Labs:  Recent Labs  09/20/16 0453  09/20/16 2316 09/21/16 0502 09/21/16 0834 09/21/16 2353 09/22/16 0331 09/22/16 1029  HGB 7.1*  --   --  7.0*  --  7.3* 7.3*  --   HCT 23.6*  --   --  23.5*  --  24.9* 24.7*  --   PLT 248  --   --  197  --  151 139*  --   LABPROT  --   --   --   --   --   --   --  14.4  INR  --   --   --   --   --   --   --  1.11  HEPARINUNFRC 0.19*  < > 0.26*  --  0.40  --   --  0.39  CREATININE 4.14*  --   --  3.19*  --   --  2.47*  --   < > = values in this interval not displayed.  Estimated Creatinine Clearance: 42.2 mL/min (by C-G formula based on SCr of 2.47 mg/dL (H)).   Medications:  Scheduled:  . bisacodyl  10 mg Rectal Once  . carvedilol  6.25 mg Oral BID WC  . ceFEPime (MAXIPIME) IV  1 g Intravenous Q24H  . docusate sodium  100 mg Oral BID  . ferrous sulfate  325 mg Oral TID WC  . mouth rinse  15 mL Mouth Rinse BID  . omega-3 acid ethyl esters  1 g Oral BID  . sodium chloride flush  3 mL Intravenous Q12H   Infusions:  . sodium chloride 10 mL/hr at 09/21/16 0600  . heparin 2,350 Units/hr (09/22/16 0545)    Assessment: 60 yo F  continues on heparin for rule-out PE.  Doppler negative for DVT.  Decided to transition heparin > Coumadin for afib.  No plans to pursue VQ scan or further studies to rule out PE.  Heparin currently therapeutic on 2350 units/hr.  Baseline INR 1.11.  Goal of Therapy:  Heparin level 0.3-0.7 units/ml Monitor platelets by anticoagulation protocol: Yes   Plan:  Continue heparin at 2350 units/hr Coumadin 10mg  PO x 1 tonight Daily heparin level, CBC, INR Coumadin education materials ordered  [employer], Pharm.D., BCPS Clinical Pharmacist Pager [phone] 09/22/2016 1:08 PM

## 2016-09-22 NOTE — Evaluation (Signed)
Occupational Therapy Evaluation Patient Details Name: Paige Marshall MRN: [id number] DOB: [birth year] Today's Date: 09/22/2016    History of Present Illness 60 y.o. female with medical history significant of NICM with EF 40-45% in April 2016, CKD stage 3 at baseline, PAF, HTN, morbid obesity, mixed type COPD.  At baseline she uses O2 only at night, unable to tolerate CPAP.  She was admitted from 11/18-21 for syncope, right heart failure with NYHA class 3, PAF, morbid obesity, left fibular fracture, and acute on chronic respiratory failure.  She was recommended for SNF but she declined placement. Readmitted 11/26 to 12/5 for similar with acute on chronic hypoxic resp failure .  That improved with diuresis . She was discharged to rehab as she was considered not a surgical candidate and recommended for cam walker w/ no rehab for 4-6 weeks. . She returned to ER on 12/9 for increased sob and dry cough , felt she was feeling back up with fluid. Pt hypotensive and bradycardic on admission   Clinical Impression   Pt has required assist for ADL at bed level PTA. Currently pt min assist for UB ADL in sitting and total assist for LB ADL at bed level. Attempted sit to stand from EOB x4 with use of sara plus; pt unable to perform; will need maximove for future attempts at [place]. Recommend return to SNF upon d/c to maximize independence and safety with ADL and functional mobility. Pt would benefit from continued skilled OT to address established goals.    Follow Up Recommendations  SNF;Supervision/Assistance - 24 hour    Equipment Recommendations  Other (comment) (TBD at next venue)    Recommendations for Other Services       Precautions / Restrictions Precautions Precautions: Fall Restrictions Weight Bearing Restrictions: Yes LLE Weight Bearing: Non weight bearing      Mobility Bed Mobility Overal bed mobility: Needs Assistance Bed Mobility: Supine to Sit;Sit to Supine     Supine to  sit: Min assist Sit to supine: Min assist   General bed mobility comments: Min assist to manage LLE.  Transfers Overall transfer level: Needs assistance               General transfer comment: Attempted sit to stand from EOB x4 with use of sara plus; pt unable to perform. Will need to use maxi move.    Balance Overall balance assessment: Needs assistance Sitting-balance support: Feet supported;Bilateral upper extremity supported Sitting balance-Leahy Scale: Fair                                      ADL Overall ADL's : Needs assistance/impaired Eating/Feeding: Set up;Sitting   Grooming: Set up;Sitting   Upper Body Bathing: Minimal assistance;Sitting   Lower Body Bathing: Total assistance;Bed level   Upper Body Dressing : Minimal assistance;Sitting   Lower Body Dressing: Total assistance;Bed level     Toilet Transfer Details (indicate cue type and reason): Pt using female urinal with sisters assist at bed level Toileting- Clothing Manipulation and Hygiene: Total assistance;Bed level         General ADL Comments: Attempted use of sara plus for standing; pt unable to perform with multiple attempts. Will need to use maxi move.     Vision     Perception     Praxis      Pertinent Vitals/Pain Pain Assessment: Faces Faces Pain Scale: Hurts little more Pain Location: LLE  Pain Descriptors / Indicators: Grimacing Pain Intervention(s): Monitored during session     Hand Dominance Right   Extremity/Trunk Assessment Upper Extremity Assessment Upper Extremity Assessment: Overall WFL for tasks assessed   Lower Extremity Assessment Lower Extremity Assessment: Defer to PT evaluation   Cervical / Trunk Assessment Cervical / Trunk Assessment: Other exceptions Cervical / Trunk Exceptions: body habitus   Communication Communication Communication: No difficulties   Cognition Arousal/Alertness: Awake/alert Behavior During Therapy: WFL for tasks  assessed/performed Overall Cognitive Status: Within Functional Limits for tasks assessed                     General Comments       Exercises       Shoulder Instructions      Home Living Family/patient expects to be discharged to:: Skilled nursing facility                                        Prior Functioning/Environment Level of Independence: Needs assistance  Gait / Transfers Assistance Needed: has been standing with sara plus and using hoyer lift for transfers ADL's / Homemaking Assistance Needed: assist with ADL at bed level            OT Problem List: Decreased strength;Decreased range of motion;Impaired balance (sitting and/or standing);Decreased activity tolerance;Decreased knowledge of use of DME or AE;Decreased knowledge of precautions;Obesity;Impaired UE functional use;Pain;Increased edema   OT Treatment/Interventions: Self-care/ADL training;Therapeutic exercise;Energy conservation;DME and/or AE instruction;Therapeutic activities;Patient/family education;Balance training    OT Goals(Current goals can be found in the care plan section) Acute Rehab OT Goals Patient Stated Goal: return to rehab OT Goal Formulation: With patient/family Time For Goal Achievement: 10/06/16 Potential to Achieve Goals: Good ADL Goals Pt Will Perform Grooming: with set-up;with supervision;sitting Pt Will Perform Upper Body Bathing: with set-up;with supervision;sitting Pt Will Transfer to Toilet: with min assist;with transfer board;bedside commode Pt/caregiver will Perform Home Exercise Program: Increased strength;Both right and left upper extremity;With theraband;Independently;With written HEP provided  OT Frequency: Min 2X/week   Barriers to D/C:            Co-evaluation PT/OT/SLP Co-Evaluation/Treatment: Yes Reason for Co-Treatment: Complexity of the patient's impairments (multi-system involvement);For patient/therapist safety   OT goals addressed  during session: ADL's and self-care      End of Session Equipment Utilized During Treatment: Gait belt;Oxygen;Other (comment) (sara plus) Nurse Communication: Need for lift equipment  Activity Tolerance: Patient tolerated treatment well Patient left: in bed;with call bell/phone within reach;with family/visitor present   Time: [id number] OT Time Calculation (min): 50 min Charges:  OT General Charges $OT Visit: 1 Procedure OT Evaluation $OT Eval Moderate Complexity: 1 Procedure OT Treatments $Self Care/Home Management : 8-22 mins G-Codes:     Binnie Kand M.S., OTR/L Pager: [phone]  09/22/2016, 10:49 AM

## 2016-09-22 NOTE — Plan of Care (Signed)
Problem: Safety: Goal: Ability to remain free from injury will improve Outcome: Progressing No falls or injuries this shift. Fall risk bundle in place. Call light within reach, bariatric bed in lowest position, will continue to perform hourly rounding.

## 2016-09-22 NOTE — Progress Notes (Signed)
Admit: 09/18/2016 LOS: 4  26F AoCKD (BL SCr mid 1s), decompensated CHF, BOO  Subjective:  Further improved GFR Good UOP AM weight seems incorrect To go on warfarin for AFib and ? PE   12/12 0701 - 12/13 0700 In: [id number] [P.O.:720; I.V.:335; Blood:365; IV Piggyback:50] Out: 850 [Urine:850]  Filed Weights   09/20/16 0400 09/21/16 0339 09/22/16 0504  Weight: (!) 169.7 kg (374 lb 1.9 oz) (!) 166 kg (366 lb) (!) 176.9 kg (390 lb)    Scheduled Meds: . bisacodyl  10 mg Rectal Once  . carvedilol  6.25 mg Oral BID WC  . ceFEPime (MAXIPIME) IV  1 g Intravenous Q24H  . docusate sodium  100 mg Oral BID  . mouth rinse  15 mL Mouth Rinse BID  . omega-3 acid ethyl esters  1 g Oral BID  . sodium chloride flush  3 mL Intravenous Q12H   Continuous Infusions: . sodium chloride 10 mL/hr at 09/21/16 0600  . heparin 2,350 Units/hr (09/22/16 0545)   PRN Meds:.Place/Maintain arterial line **AND** sodium chloride, albuterol, atropine, hydrALAZINE, ondansetron **OR** ondansetron (ZOFRAN) IV, oxyCODONE, polyethylene glycol  Current Labs: reviewed    Physical Exam:  Blood pressure 118/78, pulse 82, temperature 98 F (36.7 C), temperature source Oral, resp. rate 17, height 5\' 7"  (1.702 m), weight (!) 176.9 kg (390 lb), last menstrual period 09/07/2011, SpO2 96 %. Obese, NAD FOley in place RRR Diminished throughout, nl WOB, O2 on Obese legs No rashes AAO NAD EOMI NCAT  A 1. AoCKD3 likely related to R sided CHF, ? Hypovolemia; improving 2. Obesity / Hypoventilation 3. Anemia -- TSAT 10% and likely anemia of CKD + chronic illness; some suggestion of Fe def 4. Bradycardia 5. CHF, NICM, diastolic 6. Hx/o HTN 7. MOrbid Obesity 8. OSA not on CPAP 9. Rheumatoid Arthritis 10. HLD   P 1. Hold diuretics, once GFR levels off or as new CHF symptom develop would start on lower dose of oral diuretic 2. Trial of oral iron prior to ESA 3. Daily weights, Daily Renal Panel, Strict I/Os, Avoid  nephrotoxins (NSAIDs, judicious IV Contrast)  4. Will sign off for now. Call with any questions or concerns.  5. Will arrange f/u appt in our office  Pearson Grippe MD 09/22/2016, 10:25 AM   Recent Labs Lab 09/20/16 0453 09/21/16 0502 09/22/16 0331  NA 134* 135 138  K 4.8 4.6 4.3  CL 93* 95* 100*  CO2 24 29 29   GLUCOSE 85 113* 91  BUN 102* 91* 77*  CREATININE 4.14* 3.19* 2.47*  CALCIUM 8.5* 8.7* 8.9  PHOS 6.9* 5.6* 4.6    Recent Labs Lab 09/18/16 1126  09/21/16 0502 09/21/16 2353 09/22/16 0331  WBC 7.6  < > 6.1 6.0 5.5  NEUTROABS 6.3  --   --   --   --   HGB 7.3*  < > 7.0* 7.3* 7.3*  HCT 25.1*  < > 23.5* 24.9* 24.7*  MCV 88.7  < > 88.7 88.0 87.6  PLT 194  < > 197 151 139*  < > = values in this interval not displayed.

## 2016-09-22 NOTE — Progress Notes (Signed)
Patient Name: Glendell Docker Date of Encounter: 09/22/2016  Primary Cardiologist: Dr. Gwenlyn Found  Patient Profile   [name] a 60 y.o.femalewith history of morbid obesity, former tobacco abuse, PAF, OSA noncompliant with CPAP (uses [place] QHS), NICM, chronic combined CHF, normal coronary arteries, HTN, dyslipidemia, CKD stage III-IV(prev baseline CR around 1.6), COPD, suspected obesity-related hypoventilation syndrome, anemia of chronic disease, rheumatoid arthritis.   She has a history [place] 01/2015: normal cors, EF 45-50%.  She was admitted on 12/9 with increasing respiratory failure.  Cardiology consulted for treatment of bradycardia and hypotension.    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 Denies chest pain.   Inpatient Medications    Scheduled Meds: . bisacodyl  10 mg Rectal Once  . ceFEPime (MAXIPIME) IV  1 g Intravenous Q24H  . docusate sodium  100 mg Oral BID  . mouth rinse  15 mL Mouth Rinse BID  . omega-3 acid ethyl esters  1 g Oral BID  . sodium chloride flush  3 mL Intravenous Q12H   Continuous Infusions: . sodium chloride 10 mL/hr at 09/21/16 0600  . heparin 2,350 Units/hr (09/22/16 0545)   PRN Meds: Place/Maintain arterial  line **AND** sodium chloride, albuterol, atropine, hydrALAZINE, ondansetron **OR** ondansetron (ZOFRAN) IV, oxyCODONE, polyethylene glycol   Vital Signs    Vitals:   09/22/16 0124 09/22/16 0131 09/22/16 0504 09/22/16 0804  BP: 127/68  137/85 118/78  Pulse: 81 80 81 82  Resp: 20 17 17    Temp: 97.7 F (36.5 C)  97.7 F (36.5 C) 98 F (36.7 C)  TempSrc: Oral  Oral Oral  SpO2: 91% 91% 100% 96%  Weight:   (!) 390 lb (176.9 kg)   Height:        Intake/Output Summary (Last 24 hours) at 09/22/16 0853 Last data filed at 09/22/16 0545  Gross per 24 hour  Intake             1470 ml  Output              850 ml  Net              620 ml   Filed Weights   09/20/16 0400 09/21/16 0339 09/22/16 0504  Weight: (!) 374 lb 1.9 oz (169.7 kg) (!) 366 lb (166 kg) (!) 390 lb (176.9 kg)    Physical Exam    GEN: Well nourished, well developed; morbidly obese Neck: Supple, unable to assess JVD,  Cardiac: RRR -distant S1 and S2; No M/R/G. No clubbing, cyanosis,.  Radials/DP/PT 2+ and equal bilaterally.  Edema at least 2+ in the legs. SCDs in place. Respiratory:  Respirations regular and unlabored, clear to auscultation bilaterally with fine extra wheezes. Generally reduced  breath sounds throughout. GI: Soft, nontender, nondistended, BS + x 4. Obese Skin: warm and dry, no rash. Psych: AAOx3.  Normal affect.  Labs    CBC  Recent Labs  09/21/16 2353 09/22/16 0331  WBC 6.0 5.5  HGB 7.3* 7.3*  HCT 24.9* 24.7*  MCV 88.0 87.6  PLT 151 [id number]*   Basic Metabolic Panel  Recent Labs  09/21/16 0502 09/22/16 0331  NA 135 138  K 4.6 4.3  CL 95* 100*  CO2 29 29  GLUCOSE 113* 91  BUN 91* 77*  CREATININE 3.19* 2.47*  CALCIUM 8.7* 8.9  MG 2.7* 2.7*  PHOS 5.6* 4.6   Liver Function Tests  Recent Labs  09/20/16 0453 09/22/16 0331  AST  --  30  ALT  --  95*  ALKPHOS  --  62  BILITOT  --  1.3*  PROT  --  6.0*  ALBUMIN 2.7* 2.8*  2.8*     Telemetry    Sinus rhythm in 60s - Personally  Reviewed  ECG    No EKG today  Radiology    No results found.  Cardiac Studies    Echo 12/10: Mod LVH. EF ~50-55%. No RWMA. Septum with diastolic flattening - c/w increase  RV pressures (estimated Peak PAP ~46 mmHg). Mod RV dilation & mildly reduced RV Fxn (borderline Cor Pulmonale)  LE Venous Dopplers 12/10: no evidence of DVT or Superficial Venous Thrombosis. R Baker's cyst.  Assessment & Plan  Principal Problem:   Acute respiratory failure with hypoxemia (HCC) - multifactorial, but suspect that a major portion is related to OSA/OHS.  PCCM treating for possible PNA, de-escalating Rx    Obesity hypoventilation syndrome (HCC) - per PCCM, Bedtime BiPAP   Pulmonary hypertension - chronic,but concern for PE given presentation: on Heparin gtt   No DVT on dopplers, but Echo findings are somewhat concerning with septal flattening (suspect that Peak PA pressures are underestimated  If PE is identified, would recommend IVC filter. --> Consider VQ scan since CT scan with contrast contraindicated with renal insufficiency; unfortunately, VQ scan will be sub-optimal with her body habitus & OSH.  As she will be sedentary while convalescing from her leg fxr & has underlying Afib - I think it would be reasonable (since her Hgb is stable on Heparin) to treat with ~6 month of full anticoagulation --> Cr is still elevated, so would need to use Warfarin with Heparin bridge, but could convert to DOAC is renal Fxn continues to improve.    Acute on chronic diastolic CHF (congestive heart failure) ([place]) - complicated by initial hypotension/shock subsequently resolved  Seems to be intravascularly dry / anemic with pre-renal failure, but total body hypervolemic -- defer diuretics to Nephrology  Probably needs standing PO diuretic, but will defer timing to Nephrology    Anemia - given PRBC x 1; plan per d/w Dr. Sloan Leiter is to initiate full anticoagulation (at least for short term) given concern for PE/DVT  (& continued risk) along with PAF      PAF (paroxysmal atrial fibrillation) ([place]) -- stable HR now, off BB & CCB  Will need to monitor during BB washout for RVR; -- BP stable - will restart BB @ low dose (Coreg 6.25 mg bid)  Not on AC despite CHA2DS2Vasc of 3 b/c anemia & concern for bleed.  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, NYHA class 2 and CKD stage 3 ([place])  -  now with A on C Renal failure; HyperKalemia stable   Initially Rx'ed with IVF boluses -- now will need to diurese once stable  Creatinine slowly improving, would defer diuresis to nephrology.-- Remains almost 3 L positive for the hospitalization (but seems euvolemic - suspect that with return of renal Fxn that she will autodiurese; Probably needs standing PO diuretic for d/c    Obstructive sleep apnea   Uncontrolled hypertension - now hypotensive    Orthostatic hypotension; all BP meds on hold  Signed, Arbutus Leas, NP  09/22/2016, 8:53 AM    I have seen, examined and evaluated the patient this AM along with Jettie Booze, NP.  After reviewing all the available data and chart, we discussed the patients laboratory, study & physical findings as well as symptoms in detail. I agree with her findings, examination as well as impression recommendations as per our discussion.    Complicated situation, but overall stable from a CV standpoint. Agree with ~6 month trial of full AC for PAF & ? Possible PE (especially as she is @ risk for DVT post fracture) - for now warfarin is our best choice with Cr > 2. (if renal fxn gets better can use DOAC) -- will need bridge.  Hgb has remained stable on Heparin, so less concerned about bleed.  Restart low dose BB now & monitor Will need low dose PO diuretic on d/c,but not active CHF Sx   Glenetta Hew, M.D., M.S. Interventional Cardiologist   Pager # [phone] Phone # [phone] [place],  [address]

## 2016-09-22 NOTE — Care Management Important Message (Signed)
Important Message  Patient Details  Name: Paige Marshall MRN: [id number] Date of Birth: [birth year]   Medicare Important Message Given:  Yes    Nathen May 09/22/2016, 10:22 AM

## 2016-09-22 NOTE — Progress Notes (Signed)
PROGRESS NOTE        PATIENT DETAILS Name: Paige Marshall Age: 60 y.o. Sex: female Date of Birth: 09/06/1956 Admit Date: 09/18/2016 Admitting Physician Elwin Mocha, MD [id number], Milford Cage, NP  Brief Narrative: Patient is a 60 y.o. female with history of chronic kidney disease stage III,NICM with EF 40-45% in April 2016, COPD on nocturnal O2 admitted on 12/9 for evaluation of acute on chronic hypoxemic/hypercarbic respiratory failure along with hypotension and bradycardia. Patient was found to be in acute on chronic renal failure stage III. Nephrology and cardiology were consulted. With supportive measures, patient improved, and was transferred to the [facility] service on 12/12.  Subjective: Awake-alert.Sleeping comfortably when I walked in  Denies any chest pain or shortness of breath.  Assessment/Plan: Acute on chronic hypoxic and hypercarbic respiratory failure: Multifactorial-suspected etiology obesity hypoventilation syndrome, acute diastolic failure,possible HCAP. Doubt venous thromboembolism-lower extremity Dopplers negative for DVT-currently on IV heparin-since has a history of paroxysmal atrial fibrillation-after discussion with discussed with cardiology-Dr. Shelbie Proctor are in agreement to start anticoagulation with Coumadin. There is no evidence of GI bleeding-anemia is most likely secondary to chronic kidney disease. Furthermore, given recent left fibular fracture nonweightbearing status-she remains at risk for VTE. Since we are starting anticoagulation, do not think we need to pursue VQ scan. Continue BiPAP at bedtime.  Acute on chronic diastolic heart failure: Currently relatively well compensated-not on diuretics-as hospital course complicated by hypotension/shock. Cardiology following and directing care.  Shock/hypotension: Probably a combination of hypovolemic shock/medications-blood pressure is now stable. Follow.  Sinus  bradycardia: Felt to be secondary to medications-continue to monitor and telemetry-holding Cardizem, Coreg and Rythmol.  History of paroxysmal atrial fibrillation:CHA2DS2Vasc of 3-on IV heparin-have started Coumadin-see above. Note, discussed with patient today-she denies any prior GI bleeding or any adverse reaction in the past when she was on Eliquis. Given bradycardia-all rate controlling agents were discontinued, but since heart rate has improved, Coreg has been restarted. Follow closely on telemetry.   Acute on chronic kidney disease stage III: Per nephrology-likely related to hypovolemia and low flow state due to CHF. Creatinine down trending with supportive care.  Anemia: Has anemia related to chronic kidney disease-suspect worsening anemia due to acute illness. No evidence of overt GI bleeding at this time. Anemia panel consistent chronic disease-since hemoglobin decreased to 7-patient was transfused 1 unit of PRBC. Spoke with Dr. Marylu Lund recommends initiating iron supplementation prior to starting ESA. Will need close follow-up in the outpatient setting-as patient on anticoagulation.  ?HCAP: Afebrile-no leukocytosis-previously on broad-spectrum antibiotics-now just on cefepime-plan a total of 5 days of antimicrobial therapy.   Elevated liver enzymes: Likely shock liver due to hypotension-avoid hepatotoxic agents-LFTs have significantly improved. RUQ ultrasound does show cholelithiasis-but abdomen exam is benign and not consistent with acute cholecystitis. Plans are to follow for now.  COPD: Stable-no evidence of exacerbation-continue bronchodilators. On nocturnal oxygen at home.  Obesity hypoventilation syndrome: Continue BiPAP daily at bedtime.  Hx of Left fibular fracture: per most recent d/c summary on 12/5-per orthopedics (Dr Erlinda Hong) not a surgical candidate due to her chronic respiratory failure. Recommendations were to be non weight bearing for 4-6 weeks, and must be in CAM walker for  4-6 weeks.  Physical deconditioning/debility: Suspect will require SNF placement-on discharge. PT/OT eval  DVT Prophylaxis: Full dose anticoagulation with Heparin  Code Status: Full code  Family Communication: None  at bedside  Disposition Plan: Remain inpatient-but will plan on SNF on discharge-probably in the next day or so  Antimicrobial agents: See below  Procedures: Echo 12/10>>EF 50-55%,RV mod dilated, PA pressure 49 mmHg  CONSULTS:  cardiology, pulmonary/intensive care and nephrology  Time spent: 25- minutes-Greater than 50% of this time was spent in counseling, explanation of diagnosis, planning of further management, and coordination of care.  MEDICATIONS: Anti-infectives    Start     Dose/Rate Route Frequency Ordered Stop   09/19/16 1315  ceFEPIme (MAXIPIME) 1 g in dextrose 5 % 50 mL IVPB     1 g 100 mL/hr over 30 Minutes Intravenous Every 24 hours 09/19/16 1302     09/18/16 2000  vancomycin (VANCOCIN) 2,000 mg in sodium chloride 0.9 % 500 mL IVPB  Status:  Discontinued     2,000 mg 250 mL/hr over 120 Minutes Intravenous Every 48 hours 09/18/16 1941 09/19/16 1320   09/18/16 1945  aztreonam (AZACTAM) 1 g in dextrose 5 % 50 mL IVPB  Status:  Discontinued     1 g 100 mL/hr over 30 Minutes Intravenous Every 8 hours 09/18/16 1940 09/19/16 1302      Scheduled Meds: . bisacodyl  10 mg Rectal Once  . ceFEPime (MAXIPIME) IV  1 g Intravenous Q24H  . docusate sodium  100 mg Oral BID  . mouth rinse  15 mL Mouth Rinse BID  . omega-3 acid ethyl esters  1 g Oral BID  . sodium chloride flush  3 mL Intravenous Q12H   Continuous Infusions: . sodium chloride 10 mL/hr at 09/21/16 0600  . heparin 2,350 Units/hr (09/22/16 0545)   PRN Meds:.Place/Maintain arterial line **AND** sodium chloride, albuterol, atropine, hydrALAZINE, ondansetron **OR** ondansetron (ZOFRAN) IV, oxyCODONE, polyethylene glycol   PHYSICAL EXAM: Vital signs: Vitals:   09/21/16 2250 09/22/16 0124  09/22/16 0131 09/22/16 0504  BP: (!) 126/57 127/68  137/85  Pulse: 79 81 80 81  Resp: 15 20 17 17   Temp: 97.8 F (36.6 C) 97.7 F (36.5 C)  97.7 F (36.5 C)  TempSrc: Oral Oral  Oral  SpO2: 98% 91% 91% 100%  Weight:    (!) 176.9 kg (390 lb)  Height:       Filed Weights   09/20/16 0400 09/21/16 0339 09/22/16 0504  Weight: (!) 169.7 kg (374 lb 1.9 oz) (!) 166 kg (366 lb) (!) 176.9 kg (390 lb)   Body mass index is 61.08 kg/m.   General appearance :Awake, alert, not in any distress.  Eyes:, pupils equally reactive to light and accomodation,no scleral icterus. HEENT: Atraumatic and Normocephalic Neck: supple, no JVD. No cervical lymphadenopathy. Resp:Good air entry bilaterally, no added sounds  CVS: S1 S2 regular GI: Bowel sounds present, Non tender and not distended with no gaurding, rigidity or rebound.No organomegaly. Obese abdomen Extremities: B/L Lower Ext shows trace edema, both legs are warm to touch Neurology:  speech clear,Non focal, sensation is grossly intact. Psychiatric: Normal judgment and insight.  Musculoskeletal:No digital cyanosis Skin:No Rash, warm and dry Wounds:N/A  I have personally reviewed following labs and imaging studies  LABORATORY DATA: CBC:  Recent Labs Lab 09/18/16 1126  09/19/16 0634 09/20/16 0453 09/21/16 0502 09/21/16 2353 09/22/16 0331  WBC 7.6  --  5.8 6.2 6.1 6.0 5.5  NEUTROABS 6.3  --   --   --   --   --   --   HGB 7.3*  < > 7.2* 7.1* 7.0* 7.3* 7.3*  HCT 25.1*  < > 24.8*  23.6* 23.5* 24.9* 24.7*  MCV 88.7  --  87.9 87.1 88.7 88.0 87.6  PLT 194  --  224 248 197 151 139*  < > = values in this interval not displayed.  Basic Metabolic Panel:  Recent Labs Lab 09/18/16 1607  09/19/16 0634 09/19/16 1636 09/20/16 0453 09/21/16 0502 09/22/16 0331  NA  --   < > 132* 135 134* 135 138  K  --   < > 5.0 5.2* 4.8 4.6 4.3  CL  --   < > 90* 94* 93* 95* 100*  CO2  --   < > 29 24 24 29 29   GLUCOSE  --   < > 90 110* 85 113* 91  BUN  --    < > 99* 102* 102* 91* 77*  CREATININE  --   < > 4.37* 4.42* 4.14* 3.19* 2.47*  CALCIUM  --   < > 8.8* 8.7* 8.5* 8.7* 8.9  MG 3.1*  --   --   --  2.9* 2.7* 2.7*  PHOS 8.4*  --   --   --  6.9* 5.6* 4.6  < > = values in this interval not displayed.  GFR: Estimated Creatinine Clearance: 42.2 mL/min (by C-G formula based on SCr of 2.47 mg/dL (H)).  Liver Function Tests:  Recent Labs Lab 09/18/16 1126 09/19/16 0634 09/20/16 0453 09/22/16 0331  AST 278* 130*  --  30  ALT 243* 212*  --  95*  ALKPHOS 78 85  --  62  BILITOT 1.3* 1.3*  --  1.3*  PROT 6.2* 6.1*  --  6.0*  ALBUMIN 2.8* 2.8* 2.7* 2.8*  2.8*   No results for input(s): LIPASE, AMYLASE in the last 168 hours. No results for input(s): AMMONIA in the last 168 hours.  Coagulation Profile: No results for input(s): INR, PROTIME in the last 168 hours.  Cardiac Enzymes:  Recent Labs Lab 09/18/16 1933 09/18/16 2357 09/19/16 0634  TROPONINI <0.03 0.03* 0.03*    BNP (last 3 results) No results for input(s): PROBNP in the last 8760 hours.  HbA1C: No results for input(s): HGBA1C in the last 72 hours.  CBG: No results for input(s): GLUCAP in the last 168 hours.  Lipid Profile: No results for input(s): CHOL, HDL, LDLCALC, TRIG, CHOLHDL, LDLDIRECT in the last 72 hours.  Thyroid Function Tests: No results for input(s): TSH, T4TOTAL, FREET4, T3FREE, THYROIDAB in the last 72 hours.  Anemia Panel:  Recent Labs  09/21/16 0834  VITAMINB12 1,220*  FOLATE 18.7  FERRITIN 317*  TIBC 301  IRON 29  RETICCTPCT 5.6*    Urine analysis:    Component Value Date/Time   COLORURINE YELLOW 09/13/2016 0225   APPEARANCEUR CLEAR 09/13/2016 0225   LABSPEC 1.017 09/13/2016 0225   PHURINE 5.0 09/13/2016 0225   GLUCOSEU NEGATIVE 09/13/2016 0225   HGBUR NEGATIVE 09/13/2016 0225   BILIRUBINUR NEGATIVE 09/13/2016 0225   KETONESUR NEGATIVE 09/13/2016 0225   PROTEINUR NEGATIVE 09/13/2016 0225   UROBILINOGEN 1.0 11/16/2009 0554    NITRITE NEGATIVE 09/13/2016 0225   LEUKOCYTESUR NEGATIVE 09/13/2016 0225    Sepsis Labs: Lactic Acid, Venous    Component Value Date/Time   LATICACIDVEN 1.8 09/18/2016 1933    MICROBIOLOGY: Recent Results (from the past 240 hour(s))  MRSA PCR Screening     Status: None   Collection Time: 09/18/16  3:17 PM  Result Value Ref Range Status   MRSA by PCR NEGATIVE NEGATIVE Final    Comment:  The GeneXpert MRSA Assay (FDA approved for NASAL specimens only), is one component of a comprehensive MRSA colonization surveillance program. It is not intended to diagnose MRSA infection nor to guide or monitor treatment for MRSA infections.     RADIOLOGY STUDIES/RESULTS: Dg Chest 2 View  Result Date: 09/05/2016 CLINICAL DATA:  Shortness of Breath EXAM: CHEST  2 VIEW COMPARISON:  08/28/2016 FINDINGS: Cardiac shadow is again enlarged in size. Aortic calcifications are stable. The lungs are well aerated bilaterally. No focal infiltrate or sizable effusion is seen. No acute bony abnormality is noted. IMPRESSION: No acute abnormality noted. Electronically Signed   By: Inez Catalina M.D.   On: 09/05/2016 15:34   Dg Chest 2 View  Result Date: 08/28/2016 CLINICAL DATA:  Dyspnea today. EXAM: CHEST  2 VIEW COMPARISON:  04/04/2016 FINDINGS: There is marked cardiomegaly, unchanged. There is mild vascular fullness, unchanged and likely chronic. No airspace consolidation. No effusions. No pneumothorax. Hilar and mediastinal contours are unremarkable and unchanged. IMPRESSION: Stable cardiomegaly.  No consolidation or effusion. Electronically Signed   By: Andreas Newport M.D.   On: 08/28/2016 21:52   Dg Ankle Complete Left  Result Date: 08/30/2016 CLINICAL DATA:  Fall 2 days ago.  Left ankle pain. EXAM: LEFT ANKLE COMPLETE - 3+ VIEW COMPARISON:  None. FINDINGS: There is an oblique fracture through the distal right fibula, minimally displaced. No visible tibial abnormality. Diffuse soft tissue  swelling. Ankle mortise appears intact. IMPRESSION: Oblique minimally displaced fracture through the distal left fibula. Electronically Signed   By: Rolm Baptise M.D.   On: 08/30/2016 13:34   US Renal  Result Date: 09/13/2016 CLINICAL DATA:  Acute kidney injury EXAM: RENAL / URINARY TRACT ULTRASOUND COMPLETE COMPARISON:  03/08/2016 FINDINGS: Right Kidney: Length: 12.8 cm. Echogenicity within normal limits. No mass or hydronephrosis visualized. Left Kidney: Length: 11.6 cm. Echogenicity within normal limits. No mass or hydronephrosis visualized. Bladder: Limited visualization. Appears normal for degree of bladder distention. IMPRESSION: Negative renal ultrasound. Electronically Signed   By: Monte Fantasia M.D.   On: 09/13/2016 10:25   Dg Chest Portable 1 View  Result Date: 09/18/2016 CLINICAL DATA:  Patient with chest pain and pressure. EXAM: PORTABLE CHEST 1 VIEW COMPARISON:  Chest radiograph 09/05/2016. FINDINGS: Monitoring leads overlie the patient. Stable cardiomegaly. Minimal heterogeneous opacities lung bases bilaterally. No pleural effusion or pneumothorax. IMPRESSION: Cardiomegaly.  Basilar opacities favored to represent atelectasis. Electronically Signed   By: Lovey Newcomer M.D.   On: 09/18/2016 12:12   Dg Knee Complete 4 Views Left  Result Date: 08/29/2016 CLINICAL DATA:  Pain after fall. EXAM: LEFT KNEE - COMPLETE 4+ VIEW COMPARISON:  None. FINDINGS: There is a moderate suprapatellar joint effusion. Severe tricompartmental degenerative changes are noted. No fractures are seen. IMPRESSION: Moderate suprapatellar effusion with no visualized fracture. Electronically Signed   By: Dorise Bullion III M.D   On: 08/29/2016 01:37   US Abdomen Limited Ruq  Result Date: 09/18/2016 CLINICAL DATA:  Elevated liver function tests EXAM: US ABDOMEN LIMITED - RIGHT UPPER QUADRANT COMPARISON:  03/08/2016 FINDINGS: Gallbladder: Well distended with cholelithiasis. Gallbladder wall thickening till 1 cm is  noted. Negative sonographic Murphy's sign was elicited. Common bile duct: Diameter: 6.6 mm. Liver: No focal lesion identified. Within normal limits in parenchymal echogenicity. Minimal free fluid is noted. This could account for the changes in the gallbladder wall. IMPRESSION: Cholelithiasis. Thickened gallbladder wall without sonographic Murphy sign. This could still be indicative of acute cholecystitis. Clinical correlation is recommended. Electronically Signed  By: Inez Catalina M.D.   On: 09/18/2016 20:56     LOS: 4 days   Oren Binet, MD  [facility] Pager:[phone]  If 7PM-7AM, please contact night-coverage [email or web address] Password [email or web address] 09/22/2016, 8:03 AM

## 2016-09-23 DIAGNOSIS — I130 Hypertensive heart and chronic kidney disease with heart failure and stage 1 through stage 4 chronic kidney disease, or unspecified chronic kidney disease: Secondary | ICD-10-CM | POA: Diagnosis present

## 2016-09-23 DIAGNOSIS — N170 Acute kidney failure with tubular necrosis: Secondary | ICD-10-CM | POA: Diagnosis not present

## 2016-09-23 DIAGNOSIS — I959 Hypotension, unspecified: Secondary | ICD-10-CM | POA: Diagnosis not present

## 2016-09-23 DIAGNOSIS — M109 Gout, unspecified: Secondary | ICD-10-CM | POA: Diagnosis not present

## 2016-09-23 DIAGNOSIS — R062 Wheezing: Secondary | ICD-10-CM | POA: Diagnosis not present

## 2016-09-23 DIAGNOSIS — R069 Unspecified abnormalities of breathing: Secondary | ICD-10-CM | POA: Diagnosis not present

## 2016-09-23 DIAGNOSIS — M79672 Pain in left foot: Secondary | ICD-10-CM | POA: Diagnosis not present

## 2016-09-23 DIAGNOSIS — Z993 Dependence on wheelchair: Secondary | ICD-10-CM | POA: Diagnosis not present

## 2016-09-23 DIAGNOSIS — D631 Anemia in chronic kidney disease: Secondary | ICD-10-CM | POA: Diagnosis not present

## 2016-09-23 DIAGNOSIS — I10 Essential (primary) hypertension: Secondary | ICD-10-CM | POA: Diagnosis not present

## 2016-09-23 DIAGNOSIS — Z947 Corneal transplant status: Secondary | ICD-10-CM | POA: Diagnosis not present

## 2016-09-23 DIAGNOSIS — I5043 Acute on chronic combined systolic (congestive) and diastolic (congestive) heart failure: Secondary | ICD-10-CM | POA: Diagnosis present

## 2016-09-23 DIAGNOSIS — I509 Heart failure, unspecified: Secondary | ICD-10-CM | POA: Diagnosis not present

## 2016-09-23 DIAGNOSIS — E7800 Pure hypercholesterolemia, unspecified: Secondary | ICD-10-CM | POA: Diagnosis present

## 2016-09-23 DIAGNOSIS — Z9851 Tubal ligation status: Secondary | ICD-10-CM | POA: Diagnosis not present

## 2016-09-23 DIAGNOSIS — Z79899 Other long term (current) drug therapy: Secondary | ICD-10-CM | POA: Diagnosis not present

## 2016-09-23 DIAGNOSIS — N184 Chronic kidney disease, stage 4 (severe): Secondary | ICD-10-CM | POA: Diagnosis not present

## 2016-09-23 DIAGNOSIS — Z8673 Personal history of transient ischemic attack (TIA), and cerebral infarction without residual deficits: Secondary | ICD-10-CM | POA: Diagnosis not present

## 2016-09-23 DIAGNOSIS — R002 Palpitations: Secondary | ICD-10-CM | POA: Diagnosis not present

## 2016-09-23 DIAGNOSIS — S8265XA Nondisplaced fracture of lateral malleolus of left fibula, initial encounter for closed fracture: Secondary | ICD-10-CM | POA: Diagnosis not present

## 2016-09-23 DIAGNOSIS — J9621 Acute and chronic respiratory failure with hypoxia: Secondary | ICD-10-CM | POA: Diagnosis present

## 2016-09-23 DIAGNOSIS — R001 Bradycardia, unspecified: Secondary | ICD-10-CM | POA: Diagnosis not present

## 2016-09-23 DIAGNOSIS — N183 Chronic kidney disease, stage 3 (moderate): Secondary | ICD-10-CM | POA: Diagnosis not present

## 2016-09-23 DIAGNOSIS — J989 Respiratory disorder, unspecified: Secondary | ICD-10-CM | POA: Diagnosis not present

## 2016-09-23 DIAGNOSIS — E119 Type 2 diabetes mellitus without complications: Secondary | ICD-10-CM | POA: Diagnosis not present

## 2016-09-23 DIAGNOSIS — J9601 Acute respiratory failure with hypoxia: Secondary | ICD-10-CM | POA: Diagnosis not present

## 2016-09-23 DIAGNOSIS — E662 Morbid (severe) obesity with alveolar hypoventilation: Secondary | ICD-10-CM | POA: Diagnosis not present

## 2016-09-23 DIAGNOSIS — Z87891 Personal history of nicotine dependence: Secondary | ICD-10-CM | POA: Diagnosis not present

## 2016-09-23 DIAGNOSIS — Z955 Presence of coronary angioplasty implant and graft: Secondary | ICD-10-CM | POA: Diagnosis not present

## 2016-09-23 DIAGNOSIS — I480 Paroxysmal atrial fibrillation: Secondary | ICD-10-CM | POA: Diagnosis present

## 2016-09-23 DIAGNOSIS — I5033 Acute on chronic diastolic (congestive) heart failure: Secondary | ICD-10-CM | POA: Diagnosis not present

## 2016-09-23 DIAGNOSIS — R0602 Shortness of breath: Secondary | ICD-10-CM | POA: Diagnosis not present

## 2016-09-23 DIAGNOSIS — M84364D Stress fracture, left fibula, subsequent encounter for fracture with routine healing: Secondary | ICD-10-CM | POA: Diagnosis not present

## 2016-09-23 DIAGNOSIS — J441 Chronic obstructive pulmonary disease with (acute) exacerbation: Secondary | ICD-10-CM | POA: Diagnosis not present

## 2016-09-23 DIAGNOSIS — Z7901 Long term (current) use of anticoagulants: Secondary | ICD-10-CM | POA: Diagnosis not present

## 2016-09-23 DIAGNOSIS — R0902 Hypoxemia: Secondary | ICD-10-CM | POA: Diagnosis not present

## 2016-09-23 DIAGNOSIS — Z9981 Dependence on supplemental oxygen: Secondary | ICD-10-CM | POA: Diagnosis not present

## 2016-09-23 DIAGNOSIS — Z6841 Body Mass Index (BMI) 40.0 and over, adult: Secondary | ICD-10-CM | POA: Diagnosis not present

## 2016-09-23 DIAGNOSIS — D638 Anemia in other chronic diseases classified elsewhere: Secondary | ICD-10-CM | POA: Diagnosis present

## 2016-09-23 DIAGNOSIS — R609 Edema, unspecified: Secondary | ICD-10-CM | POA: Diagnosis not present

## 2016-09-23 DIAGNOSIS — J449 Chronic obstructive pulmonary disease, unspecified: Secondary | ICD-10-CM | POA: Diagnosis not present

## 2016-09-23 DIAGNOSIS — I5082 Biventricular heart failure: Secondary | ICD-10-CM | POA: Diagnosis present

## 2016-09-23 DIAGNOSIS — I428 Other cardiomyopathies: Secondary | ICD-10-CM | POA: Diagnosis present

## 2016-09-23 DIAGNOSIS — I5023 Acute on chronic systolic (congestive) heart failure: Secondary | ICD-10-CM | POA: Diagnosis not present

## 2016-09-23 DIAGNOSIS — K5900 Constipation, unspecified: Secondary | ICD-10-CM | POA: Diagnosis not present

## 2016-09-23 DIAGNOSIS — M6281 Muscle weakness (generalized): Secondary | ICD-10-CM | POA: Diagnosis not present

## 2016-09-23 DIAGNOSIS — Z8249 Family history of ischemic heart disease and other diseases of the circulatory system: Secondary | ICD-10-CM | POA: Diagnosis not present

## 2016-09-23 DIAGNOSIS — I2729 Other secondary pulmonary hypertension: Secondary | ICD-10-CM | POA: Diagnosis present

## 2016-09-23 DIAGNOSIS — I110 Hypertensive heart disease with heart failure: Secondary | ICD-10-CM | POA: Diagnosis not present

## 2016-09-23 DIAGNOSIS — J9600 Acute respiratory failure, unspecified whether with hypoxia or hypercapnia: Secondary | ICD-10-CM | POA: Diagnosis not present

## 2016-09-23 DIAGNOSIS — G4733 Obstructive sleep apnea (adult) (pediatric): Secondary | ICD-10-CM | POA: Diagnosis not present

## 2016-09-23 DIAGNOSIS — M170 Bilateral primary osteoarthritis of knee: Secondary | ICD-10-CM | POA: Diagnosis present

## 2016-09-23 LAB — BASIC METABOLIC PANEL
ANION GAP: 9 (ref 5–15)
BUN: 57 mg/dL — ABNORMAL HIGH (ref 6–20)
CALCIUM: 9.1 mg/dL (ref 8.9–10.3)
CHLORIDE: 101 mmol/L (ref 101–111)
CO2: 30 mmol/L (ref 22–32)
CREATININE: 1.74 mg/dL — AB (ref 0.44–1.00)
GFR calc non Af Amer: 31 mL/min — ABNORMAL LOW (ref >60)
GFR, EST AFRICAN AMERICAN: 36 mL/min — AB (ref >60)
Glucose, Bld: 101 mg/dL — ABNORMAL HIGH (ref 65–99)
Potassium: 4.1 mmol/L (ref 3.5–5.1)
SODIUM: 140 mmol/L (ref 135–145)

## 2016-09-23 LAB — CBC
HEMATOCRIT: 25.4 % — AB (ref 36.0–46.0)
HEMOGLOBIN: 7.5 g/dL — AB (ref 12.0–15.0)
MCH: 25.8 pg — ABNORMAL LOW (ref 26.0–34.0)
MCHC: 29.5 g/dL — AB (ref 30.0–36.0)
MCV: 87.3 fL (ref 78.0–100.0)
Platelets: 153 10*3/uL (ref 150–400)
RBC: 2.91 MIL/uL — ABNORMAL LOW (ref 3.87–5.11)
RDW: 20.4 % — AB (ref 11.5–15.5)
WBC: 6.3 10*3/uL (ref 4.0–10.5)

## 2016-09-23 LAB — PROTIME-INR
INR: 1.17
Prothrombin Time: 14.9 seconds (ref 11.4–15.2)

## 2016-09-23 LAB — MAGNESIUM: Magnesium: 2.4 mg/dL (ref 1.7–2.4)

## 2016-09-23 LAB — PHOSPHORUS: Phosphorus: 3.3 mg/dL (ref 2.5–4.6)

## 2016-09-23 LAB — HEPARIN LEVEL (UNFRACTIONATED): Heparin Unfractionated: 0.25 IU/mL — ABNORMAL LOW (ref 0.30–0.70)

## 2016-09-23 MED ORDER — OXYCODONE HCL 5 MG PO TABS: 5.0000 mg | ORAL_TABLET | Freq: Four times a day (QID) | ORAL | 0 refills | Status: DC | PRN

## 2016-09-23 MED ORDER — CARVEDILOL 6.25 MG PO TABS: 6.2500 mg | ORAL_TABLET | Freq: Two times a day (BID) | ORAL | Status: DC

## 2016-09-23 MED ORDER — POLYETHYLENE GLYCOL 3350 17 G PO PACK: 17.0000 g | PACK | Freq: Every day | ORAL | 0 refills | Status: AC | PRN

## 2016-09-23 MED ORDER — TORSEMIDE 20 MG PO TABS: 40.0000 mg | ORAL_TABLET | Freq: Every day | ORAL | Status: DC

## 2016-09-23 MED ORDER — WARFARIN SODIUM 10 MG PO TABS: 10.0000 mg | ORAL_TABLET | Freq: Once | ORAL | Status: DC

## 2016-09-23 MED ORDER — TORSEMIDE 20 MG PO TABS
40.0000 mg | ORAL_TABLET | Freq: Every day | ORAL | Status: DC
  Administered 2016-09-23: 40 mg via ORAL
  Filled 2016-09-23: qty 2

## 2016-09-23 MED ORDER — WARFARIN SODIUM 10 MG PO TABS: 10.0000 mg | ORAL_TABLET | Freq: Every day | ORAL | Status: DC

## 2016-09-23 MED ORDER — ENOXAPARIN SODIUM 150 MG/ML ~~LOC~~ SOLN
180.0000 mg | Freq: Two times a day (BID) | SUBCUTANEOUS | Status: DC
  Administered 2016-09-23: 180 mg via SUBCUTANEOUS
  Filled 2016-09-23 (×2): qty 1.2
  Filled 2016-09-23: qty 2

## 2016-09-23 MED ORDER — FERROUS SULFATE 325 (65 FE) MG PO TABS: 325.0000 mg | ORAL_TABLET | Freq: Three times a day (TID) | ORAL | 3 refills | Status: DC

## 2016-09-23 MED ORDER — ENOXAPARIN SODIUM 60 MG/0.6ML ~~LOC~~ SOLN: 180.0000 mg | Freq: Two times a day (BID) | SUBCUTANEOUS | Status: DC

## 2016-09-23 NOTE — Clinical Social Work Note (Signed)
Clinical Social Worker facilitated patient discharge including contacting patient family and facility to confirm patient discharge plans.  Clinical information faxed to facility and family agreeable with plan.  CSW arranged ambulance transport via [facility] to [facility].  RN to call report prior to discharge.  Clinical Social Worker will sign off for now as social work intervention is no longer needed. Please consult us again if new need arises.  Jesse Kumar Falwell, LCSW [phone] 

## 2016-09-23 NOTE — Progress Notes (Signed)
ANTICOAGULATION CONSULT NOTE - Follow Up Consult  Pharmacy Consult for heparin > coumadin Indication: rule out pulmonary embolus, afib  Allergies  Allergen Reactions  . Penicillins Hives, Itching, Swelling and Other (See Comments)    Tongue swelling Has patient had a PCN reaction causing immediate rash, facial/tongue/throat swelling, SOB or lightheadedness with hypotension: Yes  Clarified with the patient her penicillin allergy. Pt has tolerated cefepime in the past. Pt also states that she can take amoxicillin.    . Citrus Hives    Patient Measurements: Height: 5\' 7"  (170.2 cm) Weight: (!) 390 lb (176.9 kg) IBW/kg (Calculated) : 61.6 Heparin Dosing Weight: 102.4 kg  Vital Signs: Temp: 97.8 F (36.6 C) (12/13 2357) Temp Source: Oral (12/13 2357) BP: 136/80 (12/13 2357) Pulse Rate: 70 (12/13 2357)  Labs:  Recent Labs  09/21/16 0502 09/21/16 0834 09/21/16 2353 09/22/16 0331 09/22/16 1029 09/23/16 0304  HGB 7.0*  --  7.3* 7.3*  --  7.5*  HCT 23.5*  --  24.9* 24.7*  --  25.4*  PLT 197  --  151 139*  --  153  LABPROT  --   --   --   --  14.4 14.9  INR  --   --   --   --  1.11 1.17  HEPARINUNFRC  --  0.40  --   --  0.39 0.25*  CREATININE 3.19*  --   --  2.47*  --   --     Estimated Creatinine Clearance: 42.2 mL/min (by C-G formula based on SCr of 2.47 mg/dL (H)).  Assessment: 60 yo F continues on heparin for rule-out PE.  Doppler negative for DVT.  Decided to transition heparin > Coumadin for afib.  No plans to pursue VQ scan or further studies to rule out PE.  Heparin now down to subtherapeutic (0.25) on 2350 units/hr. INR up slightly to 1.17 past first dose yesterday. Hgb low but stable, plt ok. No bleeding noted. No issues with line per RN.  Goal of Therapy:  Heparin level 0.3-0.7 units/ml Monitor platelets by anticoagulation protocol: Yes   Plan:  Increase heparin to 2550 units/hr Will f/u 6 hr heparin level Coumadin 10mg  PO x 1 tonight Daily INR  Sherlon Handing, PharmD, BCPS Clinical pharmacist, pager [phone] 09/23/2016 4:57 AM

## 2016-09-23 NOTE — Progress Notes (Signed)
Patient refusing to go on BIPAP/CPAP HS at this time. Patient and nursing instructed to call RT when pt is ready.

## 2016-09-23 NOTE — Discharge Summary (Signed)
PATIENT DETAILS Name: Paige Marshall Age: 60 y.o. Sex: female Date of Birth: May 12, 1956 MRN: [id number]. Admitting Physician: Elwin Mocha, MD [id number], Milford Cage, NP  Admit Date: 09/18/2016 Discharge date: 09/23/2016  Recommendations for Outpatient Follow-up:  1. Follow up with PCP in 1-2 weeks 2. Daily INR until INR therapeutic (started on Coumadin on 12/13 (-once INR therapeutic-stop Lovenox) 3. Adjust dosing of Coumadin depending on INR-currently on 10 mg daily at bedtime. 4. Has stable anemia-follow CBC closely while on therapeutic anticoagulation (suggest at least once-or twice a week till stability further ensured)  5. SPEP pending, immunofixation urine pending-please follow 6. May need referral to hematology if anemia persists. 7. Please ensure follow-up with nephrology, cardiology, orthopedics 8. Please check renal function panel in 1 week 9. BiPAP daily at bedtime  Admitted From:  SNF  Disposition: SNF   Home Health: No  Equipment/Devices: On 3 L/m of oxygen nocturnally prior to this admission.  Discharge Condition: Stable  CODE STATUS: FULL CODE  Diet recommendation:  Heart Healthy   Brief Summary: See H&P, Labs, Consult and Test reports for all details in brief, Patient is a 60 y.o. female with history of chronic kidney disease stage III,NICM with EF 40-45% in April 2016, COPD on nocturnal O2 admitted on 12/9 for evaluation of acute on chronic hypoxemic/hypercarbic respiratory failure along with hypotension and bradycardia. Patient was found to be in acute on chronic renal failure stage III. Nephrology and cardiology were consulted.   Brief Hospital Course: Acute on chronic hypoxic and hypercarbic respiratory failure: Multifactorial-suspected etiology obesity hypoventilation syndrome, acute diastolic failure,possible HCAP. Doubt venous thromboembolism-lower extremity Dopplers negative for DVT-was maintained on IV heparin throughout this  hospital stay- since has a history of paroxysmal atrial fibrillation-after discussion with discussed with cardiology-Dr. Shelbie Proctor are in agreement to start anticoagulation with Coumadin. There is no evidence of GI bleeding-anemia is most likely secondary to chronic kidney disease. Furthermore, given recent left fibular fracture nonweightbearing status-she remains at risk for VTE. Since patient is on therapeutic anticoagulation, do not think pursuing a VQ scan is going to change management and hence has not been pursued at this time. Plans are to continue overlapping Lovenox till INR is therapeutic following which Lovenox needs to be discontinued. Plans are to encourage patient to use BiPAP at bedtime.  Acute on chronic diastolic heart failure: Currently relatively well compensated-although weight has been steadily increasing-diuretics were held on admission due to hypotension and renal failure. Since renal function is significantly improved, we will resume Demadex on discharge. Cardiology and nephrology were following patient closely throughout this hospital stay. Please follow electrolytes closely while at SNF.   Shock/hypotension: Probably a combination of hypovolemic shock/medications-blood pressure is now stable. Seems to be tolerating initiation of beta blockers 80 well. Follow closely while at SNF  Sinus bradycardia: Felt to be secondary to medications-Cardizem, Rythmol and Coreg were held during this hospital stay. Bradycardia subsequently improved, she was slowly restarted on Coreg and remained stable. Cardizem and Rythmol continue to be on hold. Please ensure follow-up with cardiology.  History of paroxysmal atrial fibrillation:CHA2DS2Vasc of 3-was on IV heparin-have started Coumadin on 12/13-see above. Note, discussed with patient she denies any prior GI bleeding or any adverse reaction in the past when she was on anticoagulation in the past. Given bradycardia-all rate controlling agents were  discontinued, but since heart rate has improved, Coreg has been restarted. Follow closely and ensure follow-up with cardiology on discharge.  Acute on chronic kidney disease stage III: Per  nephrology-likely related to hypovolemia and low flow state due to CHF. Creatinine down trending with supportive care-and now close to usual baseline. We will resume diuretics on discharge-as her weight has steadily increased.  Anemia: Has anemia related to chronic kidney disease-suspect worsening anemia due to acute illness. No evidence of overt GI bleeding at this time. Anemia panel consistent chronic disease-since hemoglobin decreased to 7-patient was transfused 1 unit of PRBC. Spoke with Dr. Marylu Lund recommends initiating iron supplementation prior to starting ESA. Hemoglobin has now slowly started to increase-up to 7.5 on day of discharge. Note, on her most recent discharge from the hospital on 12/5 and hemoglobin was 7.7. Although anemia is thought to be from anemia of chronic disease, we have sent out serum electrophoresis and urine immunofixation which will need to be followed by her PCP at SNF. She will need close monitoring of her CBC at least on a weekly/biweekly basis to ensure stability while on anticoagulation. She may need initiation of ESA agents so, and also may need referral to hematology if her hemoglobin does not improve over the next few weeks.   ?HCAP: Afebrile-no leukocytosis-previously on broad-spectrum antibiotics-has completed at least 5 days of broad-spectrum antimicrobial therapy-do not think she requires any further treatment. Suspect that this was more of atelectasis than pneumonia.   Elevated liver enzymes: Likely shock liver due to hypotension--LFTs have significantly improved. RUQ ultrasound does show cholelithiasis-but abdomen exam is benign and not consistent with acute cholecystitis. Plans are to follow for now.  COPD: Stable-no evidence of exacerbation-continue  bronchodilators. On nocturnal oxygen at home.  Obesity hypoventilation syndrome: Continue BiPAP daily at bedtime while at SNF  Hx of Left fibular fracture: per most recent d/c summary on 12/5-per orthopedics (Dr Erlinda Hong) not a surgical candidate due to her chronic respiratory failure. Recommendations were to be non weight bearing for 4-6 weeks, and must be in CAM walker for 4-6 weeks.  Physical deconditioning/debility: Back to SNF on discharge.  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   Discharge Instructions    (HEART FAILURE PATIENTS) Call MD:  Anytime you have any of the following symptoms: 1) 3 pound weight gain in 24 hours or 5 pounds in 1 week 2) shortness of breath, with or without a dry hacking cough 3) swelling in the hands, feet or stomach 4) if you have to sleep on extra pillows at night in order to breathe.    Complete by:  As directed    Diet - low sodium heart healthy    Complete by:  As directed    Discharge instructions    Complete by:  As directed    Follow with Primary MD  EDWARDS, Milford Cage, NP,Dr. Quay Burow, nephrologist at [facility]  kidney    You have been started on  overlapping therapeutic Lovenox and Coumadin-he will need very close monitoring of your INR by doing daily or every other day blood work until the INR is between 2 and 3-following which Lovenox can be discontinued.  Continue to use BiPAP daily at bedtime  Please get a complete blood count and chemistry panel checked by your Primary MD at your next visit, and again as instructed by your Primary MD.  Get Medicines reviewed and adjusted: Please take all your medications with you for your next visit with your Primary MD  Laboratory/radiological data: Please request your Primary MD to go over all hospital tests and procedure/radiological results at the follow up, please ask your Primary MD to get all Hospital records sent to his/her office.  In some cases, they will be blood work, cultures and biopsy results pending at the time of your discharge. Please request that your primary care M.D. follows up on these results.  Also Note the following: If you experience worsening of your admission symptoms, develop shortness of breath, life threatening emergency, suicidal or homicidal thoughts you must seek medical attention immediately by calling 911 or calling your MD immediately  if symptoms less severe.  You must read complete instructions/literature along with all the possible adverse reactions/side effects for all the Medicines you take and that have been prescribed to you. Take any new Medicines after you have completely understood and accpet all the possible adverse reactions/side effects.   Do not drive when taking Pain medications or sleeping medications (Benzodaizepines)  Do not take more than prescribed Pain, Sleep and Anxiety Medications. It is not advisable to combine anxiety,sleep and pain medications without talking with your primary care practitioner  Special Instructions: If you have smoked or chewed Tobacco  in the last 2 yrs please stop smoking, stop any regular Alcohol  and or any  Recreational drug use.  Wear Seat belts while driving.  Please note: You were cared for by a hospitalist during your hospital stay. Once you are discharged, your primary care physician will handle any further medical issues. Please note that NO REFILLS for any discharge medications will be authorized once you are discharged, as it is imperative that you return to your primary care physician (or establish a relationship with a primary care physician if you do not have one) for your post hospital discharge needs so that they can reassess your need for medications and monitor your lab values.   Increase activity slowly    Complete by:  As directed    non weight bearing to left lower extremity for 4-6 weeks-or until cleared by orthopedics (Dr. Marinus Maw orthopedics)       Medication List    STOP taking these medications   diltiazem 180 MG 24 hr capsule Commonly known as:  DILACOR XR   furosemide 80 MG tablet Commonly known as:  LASIX   propafenone 150 MG tablet Commonly known as:  RYTHMOL   TUBERCULIN PPD ID     TAKE these medications   albuterol 108 (90 Base) MCG/ACT inhaler Commonly known as:  PROVENTIL HFA;VENTOLIN HFA Inhale 2 puffs into the lungs every 6 (six) hours as needed for wheezing or shortness of breath.   carvedilol 6.25 MG tablet Commonly known as:  COREG Take 1 tablet (6.25 mg total) by mouth 2 (two) times daily with a meal. What changed:  medication strength  how much to take   diclofenac sodium 1 % Gel Commonly known as:  VOLTAREN Apply 4 g topically 4 (four) times daily as needed (for pain).   docusate sodium 100 MG capsule Commonly known as:  COLACE Take 1 capsule (100 mg total) by mouth 2 (two) times daily.   enoxaparin 60 MG/0.6ML injection Commonly known as:  LOVENOX Inject 1.8 mLs (180 mg total) into the skin every 12 (twelve) hours. What changed:  medication strength  how much to take  when to take this   ferrous sulfate 325 (65 FE)  MG tablet Take 1 tablet (325 mg total) by mouth 3 (three) times daily with meals.   gabapentin 300 MG capsule Commonly known as:  NEURONTIN Take 1 capsule (300 mg total) by mouth at bedtime.   multivitamin with minerals Tabs tablet Take 1 tablet by mouth daily.   omega-3 acid ethyl esters 1 g capsule Commonly known as:  LOVAZA Take 1 g by mouth 2 (two) times daily.   oxyCODONE 5 MG immediate release tablet Commonly known as:  Oxy IR/ROXICODONE Take 1 tablet (5 mg total) by mouth every 6 (six) hours as needed for breakthrough pain.   OXYGEN Inhale 3 L into the lungs continuous.   polyethylene glycol packet Commonly known as:  MIRALAX / GLYCOLAX Take 17 g by mouth daily as needed. What changed:  when to take this  reasons to take this   torsemide 20 MG tablet Commonly known as:  DEMADEX Take 2 tablets (40 mg total) by mouth daily. For 3 days only (12/9-12/11) What changed:  how much to take   warfarin 10 MG tablet Commonly known as:  COUMADIN Take 1 tablet (10 mg total) by mouth daily at 6 PM. Started on 10 mg of Coumadin on 12/13-please check INR every day or every other day and adjust dosing of Coumadin accordingly.      Follow-up Information    EDWARDS, MICHELLE P, NP. Schedule an appointment as soon as possible for a visit in 1 week(s).   Specialty:  Internal Medicine Contact information: [address] [place] [address] [phone]        Quay Burow, MD. Schedule an appointment as soon as possible for a visit in 2 week(s).   Specialties:  Cardiology, Radiology Contact information: [address] [place] [address] [phone]        Eduard Roux, MD. Schedule an appointment as soon as possible for a visit in 2 week(s).   Specialty:  Orthopedic Surgery Contact information: [place] [address] [phone]        Donetta Potts, MD. Schedule an appointment as soon as possible  for a visit in 2 week(s).   Specialty:  Nephrology Contact information: [address] [place] [address] [phone]          Allergies  Allergen Reactions  . Penicillins Hives, Itching, Swelling and Other (See Comments)    Tongue swelling Has patient had a PCN reaction causing immediate rash, facial/tongue/throat swelling, SOB or lightheadedness with hypotension: Yes  Clarified with the patient her penicillin allergy. Pt has tolerated cefepime in the past. Pt also states that she can take amoxicillin.    Karlyn Agee Hives    Consultations:   cardiology, pulmonary/intensive care and nephrology  Other Procedures/Studies: Dg Chest 2 View  Result Date: 09/05/2016 CLINICAL DATA:  Shortness of Breath EXAM: CHEST  2 VIEW COMPARISON:  08/28/2016 FINDINGS: Cardiac shadow is again enlarged in size. Aortic calcifications are stable. The lungs are well aerated bilaterally. No focal infiltrate or  sizable effusion is seen. No acute bony abnormality is noted. IMPRESSION: No acute abnormality noted. Electronically Signed   By: Inez Catalina M.D.   On: 09/05/2016 15:34   Dg Chest 2 View  Result Date: 08/28/2016 CLINICAL DATA:  Dyspnea today. EXAM: CHEST  2 VIEW COMPARISON:  04/04/2016 FINDINGS: There is marked cardiomegaly, unchanged. There is mild vascular fullness, unchanged and likely chronic. No airspace consolidation. No effusions. No pneumothorax. Hilar and mediastinal contours are unremarkable and unchanged. IMPRESSION: Stable cardiomegaly.  No consolidation or effusion. Electronically Signed   By: Andreas Newport M.D.   On: 08/28/2016 21:52   Dg Ankle Complete Left  Result Date: 08/30/2016 CLINICAL DATA:  Fall 2 days ago.  Left ankle pain. EXAM: LEFT ANKLE COMPLETE - 3+ VIEW COMPARISON:  None. FINDINGS: There is an oblique fracture through the distal right fibula, minimally displaced. No visible tibial abnormality. Diffuse soft tissue swelling. Ankle mortise appears intact.  IMPRESSION: Oblique minimally displaced fracture through the distal left fibula. Electronically Signed   By: Rolm Baptise M.D.   On: 08/30/2016 13:34   US Renal  Result Date: 09/13/2016 CLINICAL DATA:  Acute kidney injury EXAM: RENAL / URINARY TRACT ULTRASOUND COMPLETE COMPARISON:  03/08/2016 FINDINGS: Right Kidney: Length: 12.8 cm. Echogenicity within normal limits. No mass or hydronephrosis visualized. Left Kidney: Length: 11.6 cm. Echogenicity within normal limits. No mass or hydronephrosis visualized. Bladder: Limited visualization. Appears normal for degree of bladder distention. IMPRESSION: Negative renal ultrasound. Electronically Signed   By: Monte Fantasia M.D.   On: 09/13/2016 10:25   Dg Chest Portable 1 View  Result Date: 09/18/2016 CLINICAL DATA:  Patient with chest pain and pressure. EXAM: PORTABLE CHEST 1 VIEW COMPARISON:  Chest radiograph 09/05/2016. FINDINGS: Monitoring leads overlie the patient. Stable cardiomegaly. Minimal heterogeneous opacities lung bases bilaterally. No pleural effusion or pneumothorax. IMPRESSION: Cardiomegaly.  Basilar opacities favored to represent atelectasis. Electronically Signed   By: Lovey Newcomer M.D.   On: 09/18/2016 12:12   Dg Knee Complete 4 Views Left  Result Date: 08/29/2016 CLINICAL DATA:  Pain after fall. EXAM: LEFT KNEE - COMPLETE 4+ VIEW COMPARISON:  None. FINDINGS: There is a moderate suprapatellar joint effusion. Severe tricompartmental degenerative changes are noted. No fractures are seen. IMPRESSION: Moderate suprapatellar effusion with no visualized fracture. Electronically Signed   By: Dorise Bullion III M.D   On: 08/29/2016 01:37   US Abdomen Limited Ruq  Result Date: 09/18/2016 CLINICAL DATA:  Elevated liver function tests EXAM: US ABDOMEN LIMITED - RIGHT UPPER QUADRANT COMPARISON:  03/08/2016 FINDINGS: Gallbladder: Well distended with cholelithiasis. Gallbladder wall thickening till 1 cm is noted. Negative sonographic Murphy's sign was  elicited. Common bile duct: Diameter: 6.6 mm. Liver: No focal lesion identified. Within normal limits in parenchymal echogenicity. Minimal free fluid is noted. This could account for the changes in the gallbladder wall. IMPRESSION: Cholelithiasis. Thickened gallbladder wall without sonographic Murphy sign. This could still be indicative of acute cholecystitis. Clinical correlation is recommended. Electronically Signed   By: Inez Catalina M.D.   On: 09/18/2016 20:56      TODAY-DAY OF DISCHARGE:  Subjective:   [name] today has no headache,no chest abdominal pain,no new weakness tingling or numbness, feels much better wants  Objective:   Blood pressure 135/83, pulse 95, temperature 98.2 F (36.8 C), temperature source Oral, resp. rate 10, height 5\' 7"  (1.702 m), weight (!) 176.9 kg (390 lb), last menstrual period 09/07/2011, SpO2 95 %.  Intake/Output Summary (Last 24 hours) at 09/23/16 1053  Last data filed at 09/23/16 0900  Gross per 24 hour  Intake          1447.52 ml  Output              650 ml  Net           797.52 ml   Filed Weights   09/21/16 0339 09/22/16 0504 09/23/16 0606  Weight: (!) 166 kg (366 lb) (!) 176.9 kg (390 lb) (!) 176.9 kg (390 lb)    Exam: Awake Alert, Oriented *3, No new F.N deficits, Normal affect [place].AT,PERRAL Supple Neck,No JVD, No cervical lymphadenopathy appriciated.  Symmetrical Chest wall movement, Good air movement bilaterally, CTAB RRR,No Gallops,Rubs or new Murmurs, No Parasternal Heave +ve B.Sounds, Abd Soft, Non tender, No organomegaly appriciated, No rebound -guarding or rigidity. No Cyanosis, Clubbing or edema, No new Rash or bruise   PERTINENT RADIOLOGIC STUDIES: Dg Chest 2 View  Result Date: 09/05/2016 CLINICAL DATA:  Shortness of Breath EXAM: CHEST  2 VIEW COMPARISON:  08/28/2016 FINDINGS: Cardiac shadow is again enlarged in size. Aortic calcifications are stable. The lungs are well aerated bilaterally. No focal infiltrate or  sizable effusion is seen. No acute bony abnormality is noted. IMPRESSION: No acute abnormality noted. Electronically Signed   By: Inez Catalina M.D.   On: 09/05/2016 15:34   Dg Chest 2 View  Result Date: 08/28/2016 CLINICAL DATA:  Dyspnea today. EXAM: CHEST  2 VIEW COMPARISON:  04/04/2016 FINDINGS: There is marked cardiomegaly, unchanged. There is mild vascular fullness, unchanged and likely chronic. No airspace consolidation. No effusions. No pneumothorax. Hilar and mediastinal contours are unremarkable and unchanged. IMPRESSION: Stable cardiomegaly.  No consolidation or effusion. Electronically Signed   By: Andreas Newport M.D.   On: 08/28/2016 21:52   Dg Ankle Complete Left  Result Date: 08/30/2016 CLINICAL DATA:  Fall 2 days ago.  Left ankle pain. EXAM: LEFT ANKLE COMPLETE - 3+ VIEW COMPARISON:  None. FINDINGS: There is an oblique fracture through the distal right fibula, minimally displaced. No visible tibial abnormality. Diffuse soft tissue swelling. Ankle mortise appears intact. IMPRESSION: Oblique minimally displaced fracture through the distal left fibula. Electronically Signed   By: Rolm Baptise M.D.   On: 08/30/2016 13:34   US Renal  Result Date: 09/13/2016 CLINICAL DATA:  Acute kidney injury EXAM: RENAL / URINARY TRACT ULTRASOUND COMPLETE COMPARISON:  03/08/2016 FINDINGS: Right Kidney: Length: 12.8 cm. Echogenicity within normal limits. No mass or hydronephrosis visualized. Left Kidney: Length: 11.6 cm. Echogenicity within normal limits. No mass or hydronephrosis visualized. Bladder: Limited visualization. Appears normal for degree of bladder distention. IMPRESSION: Negative renal ultrasound. Electronically Signed   By: Monte Fantasia M.D.   On: 09/13/2016 10:25   Dg Chest Portable 1 View  Result Date: 09/18/2016 CLINICAL DATA:  Patient with chest pain and pressure. EXAM: PORTABLE CHEST 1 VIEW COMPARISON:  Chest radiograph 09/05/2016. FINDINGS: Monitoring leads overlie the patient.  Stable cardiomegaly. Minimal heterogeneous opacities lung bases bilaterally. No pleural effusion or pneumothorax. IMPRESSION: Cardiomegaly.  Basilar opacities favored to represent atelectasis. Electronically Signed   By: Lovey Newcomer M.D.   On: 09/18/2016 12:12   Dg Knee Complete 4 Views Left  Result Date: 08/29/2016 CLINICAL DATA:  Pain after fall. EXAM: LEFT KNEE - COMPLETE 4+ VIEW COMPARISON:  None. FINDINGS: There is a moderate suprapatellar joint effusion. Severe tricompartmental degenerative changes are noted. No fractures are seen. IMPRESSION: Moderate suprapatellar effusion with no visualized fracture. Electronically Signed   By: Dorise Bullion III M.D  On: 08/29/2016 01:37   US Abdomen Limited Ruq  Result Date: 09/18/2016 CLINICAL DATA:  Elevated liver function tests EXAM: US ABDOMEN LIMITED - RIGHT UPPER QUADRANT COMPARISON:  03/08/2016 FINDINGS: Gallbladder: Well distended with cholelithiasis. Gallbladder wall thickening till 1 cm is noted. Negative sonographic Murphy's sign was elicited. Common bile duct: Diameter: 6.6 mm. Liver: No focal lesion identified. Within normal limits in parenchymal echogenicity. Minimal free fluid is noted. This could account for the changes in the gallbladder wall. IMPRESSION: Cholelithiasis. Thickened gallbladder wall without sonographic Murphy sign. This could still be indicative of acute cholecystitis. Clinical correlation is recommended. Electronically Signed   By: Inez Catalina M.D.   On: 09/18/2016 20:56     PERTINENT LAB RESULTS: CBC:  Recent Labs  09/22/16 0331 09/23/16 0304  WBC 5.5 6.3  HGB 7.3* 7.5*  HCT 24.7* 25.4*  PLT 139* 153   CMET CMP     Component Value Date/Time   NA 140 09/23/2016 0735   K 4.1 09/23/2016 0735   CL 101 09/23/2016 0735   CO2 30 09/23/2016 0735   GLUCOSE 101 (H) 09/23/2016 0735   BUN 57 (H) 09/23/2016 0735   CREATININE 1.74 (H) 09/23/2016 0735   CREATININE 1.31 (H) 11/19/2013 1120   CALCIUM 9.1 09/23/2016  0735   PROT 6.0 (L) 09/22/2016 0331   ALBUMIN 2.8 (L) 09/22/2016 0331   ALBUMIN 2.8 (L) 09/22/2016 0331   AST 30 09/22/2016 0331   ALT 95 (H) 09/22/2016 0331   ALKPHOS 62 09/22/2016 0331   BILITOT 1.3 (H) 09/22/2016 0331   GFRNONAA 31 (L) 09/23/2016 0735   GFRAA 36 (L) 09/23/2016 0735    GFR Estimated Creatinine Clearance: 59.9 mL/min (by C-G formula based on SCr of 1.74 mg/dL (H)). No results for input(s): LIPASE, AMYLASE in the last 72 hours. No results for input(s): CKTOTAL, CKMB, CKMBINDEX, TROPONINI in the last 72 hours. Invalid input(s): POCBNP No results for input(s): DDIMER in the last 72 hours. No results for input(s): HGBA1C in the last 72 hours. No results for input(s): CHOL, HDL, LDLCALC, TRIG, CHOLHDL, LDLDIRECT in the last 72 hours. No results for input(s): TSH, T4TOTAL, T3FREE, THYROIDAB in the last 72 hours.  Invalid input(s): FREET3  Recent Labs  09/21/16 0834  VITAMINB12 1,220*  FOLATE 18.7  FERRITIN 317*  TIBC 301  IRON 29  RETICCTPCT 5.6*   Coags:  Recent Labs  09/22/16 1029 09/23/16 0304  INR 1.11 1.17   Microbiology: Recent Results (from the past 240 hour(s))  MRSA PCR Screening     Status: None   Collection Time: 09/18/16  3:17 PM  Result Value Ref Range Status   MRSA by PCR NEGATIVE NEGATIVE Final    Comment:        The GeneXpert MRSA Assay (FDA approved for NASAL specimens only), is one component of a comprehensive MRSA colonization surveillance program. It is not intended to diagnose MRSA infection nor to guide or monitor treatment for MRSA infections.     FURTHER DISCHARGE INSTRUCTIONS:  Get Medicines reviewed and adjusted: Please take all your medications with you for your next visit with your Primary MD  Laboratory/radiological data: Please request your Primary MD to go over all hospital tests and procedure/radiological results at the follow up, please ask your Primary MD to get all Hospital records sent to his/her  office.  In some cases, they will be blood work, cultures and biopsy results pending at the time of your discharge. Please request that your primary care M.D. goes  through all the records of your hospital data and follows up on these results.  Also Note the following: If you experience worsening of your admission symptoms, develop shortness of breath, life threatening emergency, suicidal or homicidal thoughts you must seek medical attention immediately by calling 911 or calling your MD immediately  if symptoms less severe.  You must read complete instructions/literature along with all the possible adverse reactions/side effects for all the Medicines you take and that have been prescribed to you. Take any new Medicines after you have completely understood and accpet all the possible adverse reactions/side effects.   Do not drive when taking Pain medications or sleeping medications (Benzodaizepines)  Do not take more than prescribed Pain, Sleep and Anxiety Medications. It is not advisable to combine anxiety,sleep and pain medications without talking with your primary care practitioner  Special Instructions: If you have smoked or chewed Tobacco  in the last 2 yrs please stop smoking, stop any regular Alcohol  and or any Recreational drug use.  Wear Seat belts while driving.  Please note: You were cared for by a hospitalist during your hospital stay. Once you are discharged, your primary care physician will handle any further medical issues. Please note that NO REFILLS for any discharge medications will be authorized once you are discharged, as it is imperative that you return to your primary care physician (or establish a relationship with a primary care physician if you do not have one) for your post hospital discharge needs so that they can reassess your need for medications and monitor your lab values.  Total Time spent coordinating discharge including counseling, education and face to face time  equals 45 minutes.  SignedOren Binet 09/23/2016 10:53 AM

## 2016-09-23 NOTE — Care Management Note (Addendum)
Case Management Note  Patient Details  Name: NESIAH HATHWAY MRN: [id number] Date of Birth: 07-09-1956  Subjective/Objective:  Pt presented with Respiratory  Failure. Pt is from [facility]. Plan will be to return once stable. CSW assisting with disposition needs.             Action/Plan: No further needs from CM at this time.  Expected Discharge Date:                  Expected Discharge Plan:  [place]  In-House Referral:  Clinical Social Work  Discharge planning Services  NA  Post Acute Care Choice:  NA Choice offered to:  NA  DME Arranged:  N/A DME Agency:  NA  HH Arranged:  NA HH Agency:  NA  Status of Service:  Completed, signed off  If discussed at [place] of Stay Meetings, dates discussed:  09-23-16  Additional Comments:  Bethena Roys, RN 09/23/2016, 12:14 PM

## 2016-09-23 NOTE — Discharge Instructions (Signed)
Information on my medicine - Coumadin   (Warfarin)  This medication education was reviewed with me or my healthcare representative as part of my discharge preparation.  The pharmacist that spoke with me during my hospital stay was:  [employer], Pharm.D.  Why was Coumadin prescribed for you? Coumadin was prescribed for you because you have a blood clot or a medical condition that can cause an increased risk of forming blood clots. Blood clots can cause serious health problems by blocking the flow of blood to the heart, lung, or brain. Coumadin can prevent harmful blood clots from forming. As a reminder your indication for Coumadin is:   Stroke Prevention Because Of Atrial Fibrillation  What test will check on my response to Coumadin? While on Coumadin (warfarin) you will need to have an INR test regularly to ensure that your dose is keeping you in the desired range. The INR (international normalized ratio) number is calculated from the result of the laboratory test called prothrombin time (PT).  If an INR APPOINTMENT HAS NOT ALREADY BEEN MADE FOR YOU please schedule an appointment to have this lab work done by your health care provider within 7 days. Your INR goal is usually a number between:  2 to 3 or your provider may give you a more narrow range like 2-2.5.  Ask your health care provider during an office visit what your goal INR is.  What  do you need to  know  About  COUMADIN? Take Coumadin (warfarin) exactly as prescribed by your healthcare provider about the same time each day.  DO NOT stop taking without talking to the doctor who prescribed the medication.  Stopping without other blood clot prevention medication to take the place of Coumadin may increase your risk of developing a new clot or stroke.  Get refills before you run out.  What do you do if you miss a dose? If you miss a dose, take it as soon as you remember on the same day then continue your regularly scheduled regimen the  next day.  Do not take two doses of Coumadin at the same time.  Important Safety Information A possible side effect of Coumadin (Warfarin) is an increased risk of bleeding. You should call your healthcare provider right away if you experience any of the following: ? Bleeding from an injury or your nose that does not stop. ? Unusual colored urine (red or dark brown) or unusual colored stools (red or black). ? Unusual bruising for unknown reasons. ? A serious fall or if you hit your head (even if there is no bleeding).  Some foods or medicines interact with Coumadin (warfarin) and might alter your response to warfarin. To help avoid this: ? Eat a balanced diet, maintaining a consistent amount of Vitamin K. ? Notify your provider about major diet changes you plan to make. ? Avoid alcohol or limit your intake to 1 drink for women and 2 drinks for men per day. (1 drink is 5 oz. wine, 12 oz. beer, or 1.5 oz. liquor.)  Make sure that ANY health care provider who prescribes medication for you knows that you are taking Coumadin (warfarin).  Also make sure the healthcare provider who is monitoring your Coumadin knows when you have started a new medication including herbals and non-prescription products.  Coumadin (Warfarin)  Major Drug Interactions  Increased Warfarin Effect Decreased Warfarin Effect  Alcohol (large quantities) Antibiotics (esp. Septra/Bactrim, Flagyl, Cipro) Amiodarone (Cordarone) Aspirin (ASA) Cimetidine (Tagamet) Megestrol (Megace) NSAIDs (ibuprofen, naproxen,  etc.) °Piroxicam (Feldene) °Propafenone (Rythmol SR) °Propranolol (Inderal) °Isoniazid (INH) °Posaconazole (Noxafil) Barbiturates (Phenobarbital) °Carbamazepine (Tegretol) °Chlordiazepoxide (Librium) °Cholestyramine (Questran) °Griseofulvin °Oral Contraceptives °Rifampin °Sucralfate (Carafate) °Vitamin K  ° °Coumadin® (Warfarin) Major Herbal Interactions  °Increased Warfarin Effect Decreased Warfarin Effect   °Garlic °Ginseng °Ginkgo biloba Coenzyme Q10 °Green tea °St. John’s wort   ° °Coumadin® (Warfarin) FOOD Interactions  °Eat a consistent number of servings per week of foods HIGH in Vitamin K °(1 serving = ½ cup)  °Collards (cooked, or boiled & drained) °Kale (cooked, or boiled & drained) °Mustard greens (cooked, or boiled & drained) °Parsley *serving size only = ¼ cup °Spinach (cooked, or boiled & drained) °Swiss chard (cooked, or boiled & drained) °Turnip greens (cooked, or boiled & drained)  °Eat a consistent number of servings per week of foods MEDIUM-HIGH in Vitamin K °(1 serving = 1 cup)  °Asparagus (cooked, or boiled & drained) °Broccoli (cooked, boiled & drained, or raw & chopped) °Brussel sprouts (cooked, or boiled & drained) *serving size only = ½ cup °Lettuce, raw (green leaf, endive, romaine) °Spinach, raw °Turnip greens, raw & chopped  ° °These websites have more information on Coumadin (warfarin):  www.coumadin.com; °www.ahrq.gov/consumer/coumadin.htm; ° ° ° °

## 2016-09-23 NOTE — Progress Notes (Signed)
ANTICOAGULATION CONSULT NOTE - Follow Up Consult  Pharmacy Consult for lovenox > coumadin Indication: rule out pulmonary embolus, afib  Allergies  Allergen Reactions  . Penicillins Hives, Itching, Swelling and Other (See Comments)    Tongue swelling Has patient had a PCN reaction causing immediate rash, facial/tongue/throat swelling, SOB or lightheadedness with hypotension: Yes  Clarified with the patient her penicillin allergy. Pt has tolerated cefepime in the past. Pt also states that she can take amoxicillin.    . Citrus Hives    Patient Measurements: Height: 5\' 7"  (170.2 cm) Weight: (!) 390 lb (176.9 kg) IBW/kg (Calculated) : 61.6 Heparin Dosing Weight: 102.4 kg  Vital Signs: Temp: 98.2 F (36.8 C) (12/14 0812) Temp Source: Oral (12/14 0812) BP: 135/83 (12/14 0812) Pulse Rate: 95 (12/14 0812)  Labs:  Recent Labs  09/21/16 0502 09/21/16 0834 09/21/16 2353 09/22/16 0331 09/22/16 1029 09/23/16 0304 09/23/16 0735  HGB 7.0*  --  7.3* 7.3*  --  7.5*  --   HCT 23.5*  --  24.9* 24.7*  --  25.4*  --   PLT 197  --  151 139*  --  153  --   LABPROT  --   --   --   --  14.4 14.9  --   INR  --   --   --   --  1.11 1.17  --   HEPARINUNFRC  --  0.40  --   --  0.39 0.25*  --   CREATININE 3.19*  --   --  2.47*  --   --  1.74*    Estimated Creatinine Clearance: 59.9 mL/min (by C-G formula based on SCr of 1.74 mg/dL (H)).  Assessment: 60 yo F continues on heparin for rule-out PE.  Doppler negative for DVT.  Decided to transition heparin > Coumadin for afib.  No plans to pursue VQ scan or further studies to rule out PE.    Pharmacy asked to transition patient from heparin to lovenox in anticipation of discharge to SNF.   Goal of Therapy:  Heparin level 0.3-0.7 units/ml Monitor platelets by anticoagulation protocol: Yes   Plan:  Discontinue heparin Lovenox 180 mg SQ q12h (while inpatient) Discussed with MD adjusting dose to 150 mg SQ q12h at discharge as 150 mg is the  largest commercially available syringe size.  Coumadin 10mg  PO x 1 tonight Daily INR  [employer], Pharm.D., BCPS Clinical Pharmacist Pager [phone] 09/23/2016 10:27 AM

## 2016-09-23 NOTE — Progress Notes (Addendum)
The client is being transferred back to [facility] and report has been called. [facility] will be transporting and paperwork will be sent with them including a prescription.   Saddie Benders RN

## 2016-09-24 LAB — IMMUNOFIXATION, URINE

## 2016-09-28 LAB — PROTEIN ELECTROPHORESIS, SERUM
A/G Ratio: 1.1 (ref 0.7–1.7)
ALBUMIN ELP: 3 g/dL (ref 2.9–4.4)
Alpha-1-Globulin: 0.3 g/dL (ref 0.0–0.4)
Alpha-2-Globulin: 0.5 g/dL (ref 0.4–1.0)
Beta Globulin: 0.8 g/dL (ref 0.7–1.3)
GAMMA GLOBULIN: 1.2 g/dL (ref 0.4–1.8)
Globulin, Total: 2.8 g/dL (ref 2.2–3.9)
M-SPIKE, %: 0.7 g/dL — AB
TOTAL PROTEIN ELP: 5.8 g/dL — AB (ref 6.0–8.5)

## 2016-09-30 ENCOUNTER — Encounter: Payer: Self-pay | Admitting: *Deleted

## 2016-09-30 NOTE — Progress Notes (Signed)
[name], female   DOB: 01-15-1956, 60 y.o.   MRN: [id number] Patient did not show up for 09/30/16, 8:30 AM, appointment to have a cardiac event monitor applied.

## 2016-10-12 ENCOUNTER — Inpatient Hospital Stay (INDEPENDENT_AMBULATORY_CARE_PROVIDER_SITE_OTHER): Payer: Self-pay | Admitting: Orthopaedic Surgery

## 2016-10-12 DIAGNOSIS — M109 Gout, unspecified: Secondary | ICD-10-CM | POA: Diagnosis not present

## 2016-10-12 DIAGNOSIS — M79672 Pain in left foot: Secondary | ICD-10-CM | POA: Diagnosis not present

## 2016-10-12 DIAGNOSIS — K5900 Constipation, unspecified: Secondary | ICD-10-CM | POA: Diagnosis not present

## 2016-10-12 DIAGNOSIS — N183 Chronic kidney disease, stage 3 (moderate): Secondary | ICD-10-CM | POA: Diagnosis not present

## 2016-10-12 DIAGNOSIS — R609 Edema, unspecified: Secondary | ICD-10-CM | POA: Diagnosis not present

## 2016-10-12 DIAGNOSIS — D631 Anemia in chronic kidney disease: Secondary | ICD-10-CM | POA: Diagnosis not present

## 2016-10-13 ENCOUNTER — Ambulatory Visit: Payer: Medicare Other | Admitting: Physician Assistant

## 2016-10-15 ENCOUNTER — Ambulatory Visit: Payer: Medicare Other | Admitting: Physician Assistant

## 2016-10-15 NOTE — Progress Notes (Deleted)
Cardiology Office Note   Date:  10/15/2016   ID:  Ying, Groeber 1956/01/28, MRN [id number]  PCP:  Kerin Perna, NP  Cardiologist:  Dr Stacy Pavone, PA-C   No chief complaint on file.   History of Present Illness: Paige Marshall is a 61 y.o. 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  Admit 12/09-12/14 for resp failure w/ initial shock, echo findings concerning for PE but cannot do CTA chest or VQ 2nd CKD, body habitus. Nephrology managed diuretics, 1 U PRBCs given for anemia. Warfarin initiated for PAF and high risk of PE/DVT 2nd L fib fx  Paige Marshall presents for ***   Past Medical History:  Diagnosis Date  . Arthritis    "both knees" (01/14/2015)  . Asthma   . Chronic combined systolic and diastolic CHF (congestive heart failure) ([place])   . CKD (chronic kidney disease), stage III   . COPD (chronic obstructive pulmonary disease) ([place])   . Degenerative joint disease of both lower legs   . Edema    Lower extremity  . Former smoker   . Gout    hx in "both knees" (01/14/2015)  . H. pylori infection 01/03/2014   +breath test.    . High cholesterol   . Hypertension   . Morbid obesity ([place])   . NICM (nonischemic cardiomyopathy) ([place])    a. LHC 01/2015: normal cors, EF 45-50%. b. EF 50-55% in 08/2016.  [name] Nonischemic cardiomyopathy ([place])   . Normal coronary arteries   . OSA (obstructive sleep apnea)    "never sent CPAP out" (01/14/2015)  . Paroxysmal atrial fibrillation ([facility])   . Pneumonia X 2  . Pulmonary hypertension   . Respiratory failure ([place])   . Rheumatoid arthritis ([place])   . Stroke [facility])     Past Surgical History:  Procedure Laterality Date  . ANGIOGRAM/LV (CONGENITAL)   2007  . BREATH TEK H PYLORI N/A 12/31/2013   Procedure: [place];  Surgeon: Gayland Curry, MD;  Location: [facility];  Service: General;  Laterality: N/A;  . CORNEAL TRANSPLANT Right ~ 2010  . CORONARY ANGIOPLASTY WITH STENT PLACEMENT  11/04/2005   "1"  . DOPPLER ECHOCARDIOGRAPHY     2 D   EF of 45%  . EYE SURGERY    . LEFT HEART CATHETERIZATION WITH CORONARY ANGIOGRAM N/A 01/20/2015   Procedure: LEFT HEART CATHETERIZATION WITH CORONARY ANGIOGRAM;  Surgeon: Lorretta Harp, MD;  Location: [facility] CATH LAB;  Service: Cardiovascular;  Laterality: N/A;  . sleep study  2011  . stress myocardial dipyridamole perfusion    . TUBAL LIGATION  1982  . venous duplex ultrasound  2013    Current Outpatient Prescriptions  Medication Sig Dispense Refill  . albuterol (PROVENTIL HFA;VENTOLIN HFA) 108 (90 Base) MCG/ACT inhaler Inhale 2 puffs into the lungs every 6 (six) hours as needed for wheezing or shortness of breath. 1 Inhaler 11  . carvedilol (COREG) 6.25 MG tablet Take 1 tablet (6.25 mg total) by mouth 2 (two) times daily with a meal.    . diclofenac sodium (VOLTAREN) 1 % GEL Apply 4 g topically 4 (four) times daily as needed (for pain).    [name] docusate sodium (COLACE) 100 MG capsule Take 1  capsule (100 mg total) by mouth 2 (two) times daily. 10 capsule 0  . enoxaparin (LOVENOX) 60 MG/0.6ML injection Inject 1.8 mLs (180 mg total) into the skin every 12 (twelve) hours. 0 Syringe   . ferrous sulfate 325 (65 FE) MG tablet Take 1 tablet (325 mg total) by mouth 3 (three) times daily with meals.  3  . gabapentin (NEURONTIN) 300 MG capsule Take 1 capsule (300 mg total) by mouth at bedtime. 30 capsule 0  . Multiple Vitamin (MULTIVITAMIN WITH MINERALS) TABS tablet Take 1 tablet by mouth daily.    [name] omega-3 acid ethyl esters (LOVAZA) 1 g capsule Take 1 g by mouth 2 (two) times daily.    [name] oxyCODONE (OXY IR/ROXICODONE) 5 MG immediate release tablet Take 1 tablet (5 mg total) by mouth every 6 (six) hours as  needed for breakthrough pain. 15 tablet 0  . OXYGEN Inhale 3 L into the lungs continuous.    . polyethylene glycol (MIRALAX / GLYCOLAX) packet Take 17 g by mouth daily as needed. 14 each 0  . torsemide (DEMADEX) 20 MG tablet Take 2 tablets (40 mg total) by mouth daily. For 3 days only (12/9-12/11)    . warfarin (COUMADIN) 10 MG tablet Take 1 tablet (10 mg total) by mouth daily at 6 PM. Started on 10 mg of Coumadin on 12/13-please check INR every day or every other day and adjust dosing of Coumadin accordingly.     No current facility-administered medications for this visit.     Allergies:   Penicillins and Citrus    Social History:  The patient  reports that she quit smoking about 8 years ago. Her smoking use included Cigarettes. She has a 11.55 pack-year smoking history. She has never used smokeless tobacco. She reports that she drinks about 4.8 oz of alcohol per week . She reports that she does not use drugs.   Family History:  The patient's family history includes Cancer in her father; Heart disease in her mother; Hypertension in her other.    ROS:  Please see the history of present illness. All other systems are reviewed and negative.    PHYSICAL EXAM: VS:  LMP 09/07/2011  , BMI There is no height or weight on file to calculate BMI. GEN: Well nourished, well developed, female in no acute distress  HEENT: normal for age  Neck: no JVD, no carotid bruit, no masses Cardiac: RRR; no murmur, no rubs, or gallops Respiratory:  clear to auscultation bilaterally, normal work of breathing GI: soft, nontender, nondistended, + BS MS: no deformity or atrophy; no edema; distal pulses are 2+ in all 4 extremities   Skin: warm and dry, no rash Neuro:  Strength and sensation are intact Psych: euthymic mood, full affect   EKG:  EKG {ACTION; IS/IS GI:087931 ordered today. The ekg ordered today demonstrates ***   Recent Labs: 09/18/2016: B Natriuretic Peptide 991.0 09/22/2016: ALT  95 09/23/2016: BUN 57; Creatinine, Ser 1.74; Hemoglobin 7.5; Magnesium 2.4; Platelets 153; Potassium 4.1; Sodium 140    Lipid Panel    Component Value Date/Time   CHOL 167 01/15/2015 0346   TRIG 194 (H) 01/15/2015 0346   HDL 35 (L) 01/15/2015 0346   CHOLHDL 4.8 01/15/2015 0346   VLDL 39 01/15/2015 0346   LDLCALC 93 01/15/2015 0346     Wt Readings from Last 3 Encounters:  09/23/16 (!) 390 lb (176.9 kg)  09/14/16 (!) 382 lb (173.3 kg)  09/01/16 (!) 341 lb 11.4 oz (155 kg)  Other studies Reviewed: Additional studies/ records that were reviewed today include: ***.  ASSESSMENT AND PLAN:  1.  ***   Current medicines are reviewed at length with the patient today.  The patient {ACTIONS; HAS/DOES NOT HAVE:19233} concerns regarding medicines.  The following changes have been made:  {PLAN; NO CHANGE:13088:s}  Labs/ tests ordered today include: *** No orders of the defined types were placed in this encounter.    Disposition:   FU with ***  Signed, Rosaria Ferries, PA-C  10/15/2016 1:12 PM    [facility] Phone: [phone]; Fax: [phone]  This note was written with the assistance of speech recognition software. Please excuse any transcriptional errors.

## 2016-10-18 ENCOUNTER — Ambulatory Visit: Payer: Medicare Other | Admitting: Physician Assistant

## 2016-10-18 NOTE — Progress Notes (Deleted)
Cardiology Office Note   Date:  10/18/2016   ID:  Paige, Marshall [birth year], MRN [id number]  PCP:  Kerin Perna, NP  Cardiologist:  Dr Stacy Lagerstrom, PA-C   No chief complaint on file.   History of Present Illness: Paige Marshall is a 61 y.o. 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 Arthritis    "both knees" (01/14/2015)  . Asthma   . Chronic combined systolic and diastolic CHF (congestive heart failure) ([facility])   . CKD (chronic kidney disease), stage III   . COPD (chronic obstructive pulmonary disease) ([place])   . Degenerative joint disease of both lower legs   . Edema    Lower extremity  . Former smoker   . Gout    hx in "both knees" (01/14/2015)  . H. pylori infection 01/03/2014   +breath test.    . High cholesterol   . Hypertension   . Morbid obesity ([place])   . NICM (nonischemic cardiomyopathy) ([facility]ige [facility])    a. LHC 01/2015: normal cors, EF 45-50%. b. EF 50-55% in 08/2016.  [name] Nonischemic cardiomyopathy (Paige Marshall)   . Normal coronary arteries   . OSA (obstructive sleep apnea)    "never sent CPAP out" (01/14/2015)  . Paroxysmal atrial fibrillation (HCC)   . Pneumonia X 2  . Pulmonary hypertension   . Respiratory failure (Paige [facility])   . Rheumatoid arthritis (Paige Marshall)   . Stroke [facility])     Past Surgical History:  Procedure Laterality Date  . ANGIOGRAM/LV (CONGENITAL)  2007  . BREATH TEK H PYLORI N/A 12/31/2013   Procedure: Paige Marshall;  Surgeon: Gayland Curry, MD;   [facility];  Service: General;  Laterality: N/A;  . CORNEAL TRANSPLANT Right ~ 2010  . CORONARY ANGIOPLASTY WITH STENT PLACEMENT  11/04/2005   "1"  . DOPPLER ECHOCARDIOGRAPHY     2 D   EF of 45%  . EYE SURGERY    . LEFT HEART CATHETERIZATION WITH CORONARY ANGIOGRAM N/A 01/20/2015   Procedure: LEFT HEART CATHETERIZATION WITH CORONARY ANGIOGRAM;  Surgeon: Lorretta Harp, MD;  Location: [facility];  Service: Cardiovascular;  Laterality: N/A;  . sleep study  2011  . stress myocardial dipyridamole perfusion    . TUBAL LIGATION  1982  . venous duplex ultrasound  2013    Current Outpatient Prescriptions  Medication Sig Dispense Refill  . albuterol (PROVENTIL HFA;VENTOLIN HFA) 108 (90 Base) MCG/ACT inhaler Inhale 2 puffs into the lungs every 6 (six) hours as needed for wheezing or shortness of breath. 1 Inhaler 11  . carvedilol (COREG) 6.25 MG tablet Take 1 tablet (6.25 mg total) by mouth 2 (two) times daily with a meal.    . diclofenac sodium (VOLTAREN) 1 % GEL Apply 4 g topically 4 (four) times daily as needed (for pain).    [name] docusate sodium (COLACE) 100 MG capsule Take 1 capsule (100 mg total) by mouth 2 (two) times daily. 10 capsule 0  . enoxaparin (LOVENOX) 60 MG/0.6ML injection Inject 1.8 mLs (180 mg total)  into the skin every 12 (twelve) hours. 0 Syringe   . ferrous sulfate 325 (65 FE) MG tablet Take 1 tablet (325 mg total) by mouth 3 (three) times daily with meals.  3  . gabapentin (NEURONTIN) 300 MG capsule Take 1 capsule (300 mg total) by mouth at bedtime. 30 capsule 0  . Multiple Vitamin (MULTIVITAMIN WITH MINERALS) TABS tablet Take 1 tablet by mouth daily.    [name] omega-3 acid ethyl esters (LOVAZA) 1 g capsule Take 1 g by mouth 2 (two) times daily.    [name] oxyCODONE (OXY IR/ROXICODONE) 5 MG immediate release tablet Take 1 tablet (5 mg total) by mouth every 6 (six) hours as needed for breakthrough pain. 15 tablet 0  . OXYGEN Inhale 3 L into the lungs continuous.    .  polyethylene glycol (MIRALAX / GLYCOLAX) packet Take 17 g by mouth daily as needed. 14 each 0  . torsemide (DEMADEX) 20 MG tablet Take 2 tablets (40 mg total) by mouth daily. For 3 days only (12/9-12/11)    . warfarin (COUMADIN) 10 MG tablet Take 1 tablet (10 mg total) by mouth daily at 6 PM. Started on 10 mg of Coumadin on 12/13-please check INR every day or every other day and adjust dosing of Coumadin accordingly.     No current facility-administered medications for this visit.     Allergies:   Penicillins and Citrus    Social History:  The patient  reports that she quit smoking about 8 years ago. Her smoking use included Cigarettes. She has a 11.55 pack-year smoking history. She has never used smokeless tobacco. She reports that she drinks about 4.8 oz of alcohol per week . She reports that she does not use drugs.   Family History:  The patient's family history includes Cancer in her father; Heart disease in her mother; Hypertension in her other.    ROS:  Please see the history of present illness. All other systems are reviewed and negative.    PHYSICAL EXAM: VS:  LMP 09/07/2011  , BMI There is no height or weight on file to calculate BMI. GEN: Well nourished, well developed, female in no acute distress  HEENT: normal for age  Neck: no JVD, no carotid bruit, no masses Cardiac: RRR; no murmur, no rubs, or gallops Respiratory:  clear to auscultation bilaterally, normal work of breathing GI: soft, nontender, nondistended, + BS MS: no deformity or atrophy; no edema; distal pulses are 2+ in all 4 extremities   Skin: warm and dry, no rash Neuro:  Strength and sensation are intact Psych: euthymic mood, full affect   EKG:  EKG {ACTION; IS/IS GI:087931 ordered today. The ekg ordered today demonstrates ***   Recent Labs: 09/18/2016: B Natriuretic Peptide 991.0 09/22/2016: ALT 95 09/23/2016: BUN 57; Creatinine, Ser 1.74; Hemoglobin 7.5; Magnesium 2.4; Platelets 153; Potassium 4.1;  Sodium 140    Lipid Panel    Component Value Date/Time   CHOL 167 01/15/2015 0346   TRIG 194 (H) 01/15/2015 0346   HDL 35 (L) 01/15/2015 0346   CHOLHDL 4.8 01/15/2015 0346   VLDL 39 01/15/2015 0346   LDLCALC 93 01/15/2015 0346     Wt Readings from Last 3 Encounters:  09/23/16 (!) 390 lb (176.9 kg)  09/14/16 (!) 382 lb (173.3 kg)  09/01/16 (!) 341 lb 11.4 oz (155 kg)     Other studies Reviewed: Additional studies/ records that were reviewed today include: ***.  ASSESSMENT AND PLAN:  1.  ***   Current medicines  are reviewed at length with the patient today.  The patient {ACTIONS; HAS/DOES NOT HAVE:19233} concerns regarding medicines.  The following changes have been made:  {PLAN; NO CHANGE:13088:s}  Labs/ tests ordered today include: *** No orders of the defined types were placed in this encounter.    Disposition:   FU with ***  Signed, Rosaria Ferries, PA-C  10/18/2016 9:14 AM    [facility] Phone: [phone]; Fax: [phone]  This note was written with the assistance of speech recognition software. Please excuse any transcriptional errors.

## 2016-10-19 ENCOUNTER — Encounter: Payer: Self-pay | Admitting: *Deleted

## 2016-10-19 DIAGNOSIS — S8265XA Nondisplaced fracture of lateral malleolus of left fibula, initial encounter for closed fracture: Secondary | ICD-10-CM | POA: Diagnosis not present

## 2016-10-28 DIAGNOSIS — I480 Paroxysmal atrial fibrillation: Secondary | ICD-10-CM | POA: Diagnosis not present

## 2016-10-28 DIAGNOSIS — J449 Chronic obstructive pulmonary disease, unspecified: Secondary | ICD-10-CM | POA: Diagnosis not present

## 2016-10-28 DIAGNOSIS — M84364D Stress fracture, left fibula, subsequent encounter for fracture with routine healing: Secondary | ICD-10-CM | POA: Diagnosis not present

## 2016-10-28 DIAGNOSIS — I509 Heart failure, unspecified: Secondary | ICD-10-CM | POA: Diagnosis not present

## 2016-10-28 DIAGNOSIS — N183 Chronic kidney disease, stage 3 (moderate): Secondary | ICD-10-CM | POA: Diagnosis not present

## 2016-11-02 DIAGNOSIS — K5900 Constipation, unspecified: Secondary | ICD-10-CM | POA: Diagnosis not present

## 2016-11-02 DIAGNOSIS — R609 Edema, unspecified: Secondary | ICD-10-CM | POA: Diagnosis not present

## 2016-11-04 ENCOUNTER — Encounter (HOSPITAL_BASED_OUTPATIENT_CLINIC_OR_DEPARTMENT_OTHER): Payer: Medicare Other

## 2016-11-04 DIAGNOSIS — I509 Heart failure, unspecified: Secondary | ICD-10-CM | POA: Diagnosis not present

## 2016-11-04 DIAGNOSIS — R0602 Shortness of breath: Secondary | ICD-10-CM | POA: Diagnosis not present

## 2016-11-04 DIAGNOSIS — R609 Edema, unspecified: Secondary | ICD-10-CM | POA: Diagnosis not present

## 2016-11-07 ENCOUNTER — Inpatient Hospital Stay (HOSPITAL_COMMUNITY)
Admission: EM | Admit: 2016-11-07 | Discharge: 2016-11-13 | DRG: 291 | Disposition: A | Discharge status: Skilled Nursing Facility | Payer: Medicare Other | Attending: Internal Medicine | Admitting: Internal Medicine

## 2016-11-07 ENCOUNTER — Emergency Department (HOSPITAL_COMMUNITY): Payer: Medicare Other

## 2016-11-07 ENCOUNTER — Encounter (HOSPITAL_COMMUNITY): Payer: Self-pay

## 2016-11-07 DIAGNOSIS — J441 Chronic obstructive pulmonary disease with (acute) exacerbation: Secondary | ICD-10-CM | POA: Diagnosis not present

## 2016-11-07 DIAGNOSIS — I110 Hypertensive heart disease with heart failure: Secondary | ICD-10-CM | POA: Diagnosis not present

## 2016-11-07 DIAGNOSIS — I130 Hypertensive heart and chronic kidney disease with heart failure and stage 1 through stage 4 chronic kidney disease, or unspecified chronic kidney disease: Secondary | ICD-10-CM | POA: Diagnosis not present

## 2016-11-07 DIAGNOSIS — E662 Morbid (severe) obesity with alveolar hypoventilation: Secondary | ICD-10-CM | POA: Diagnosis present

## 2016-11-07 DIAGNOSIS — I5082 Biventricular heart failure: Secondary | ICD-10-CM | POA: Diagnosis present

## 2016-11-07 DIAGNOSIS — D638 Anemia in other chronic diseases classified elsewhere: Secondary | ICD-10-CM | POA: Diagnosis present

## 2016-11-07 DIAGNOSIS — E876 Hypokalemia: Secondary | ICD-10-CM | POA: Diagnosis not present

## 2016-11-07 DIAGNOSIS — Z9981 Dependence on supplemental oxygen: Secondary | ICD-10-CM

## 2016-11-07 DIAGNOSIS — I2729 Other secondary pulmonary hypertension: Secondary | ICD-10-CM | POA: Diagnosis present

## 2016-11-07 DIAGNOSIS — R791 Abnormal coagulation profile: Secondary | ICD-10-CM | POA: Diagnosis not present

## 2016-11-07 DIAGNOSIS — Z993 Dependence on wheelchair: Secondary | ICD-10-CM

## 2016-11-07 DIAGNOSIS — I509 Heart failure, unspecified: Secondary | ICD-10-CM

## 2016-11-07 DIAGNOSIS — S82402D Unspecified fracture of shaft of left fibula, subsequent encounter for closed fracture with routine healing: Secondary | ICD-10-CM

## 2016-11-07 DIAGNOSIS — Z947 Corneal transplant status: Secondary | ICD-10-CM

## 2016-11-07 DIAGNOSIS — N184 Chronic kidney disease, stage 4 (severe): Secondary | ICD-10-CM | POA: Diagnosis present

## 2016-11-07 DIAGNOSIS — Z79899 Other long term (current) drug therapy: Secondary | ICD-10-CM

## 2016-11-07 DIAGNOSIS — Z8249 Family history of ischemic heart disease and other diseases of the circulatory system: Secondary | ICD-10-CM

## 2016-11-07 DIAGNOSIS — J9621 Acute and chronic respiratory failure with hypoxia: Secondary | ICD-10-CM

## 2016-11-07 DIAGNOSIS — Z7901 Long term (current) use of anticoagulants: Secondary | ICD-10-CM

## 2016-11-07 DIAGNOSIS — I10 Essential (primary) hypertension: Secondary | ICD-10-CM

## 2016-11-07 DIAGNOSIS — I50810 Right heart failure, unspecified: Secondary | ICD-10-CM | POA: Diagnosis present

## 2016-11-07 DIAGNOSIS — Z955 Presence of coronary angioplasty implant and graft: Secondary | ICD-10-CM

## 2016-11-07 DIAGNOSIS — J449 Chronic obstructive pulmonary disease, unspecified: Secondary | ICD-10-CM | POA: Diagnosis present

## 2016-11-07 DIAGNOSIS — R0602 Shortness of breath: Secondary | ICD-10-CM | POA: Diagnosis not present

## 2016-11-07 DIAGNOSIS — Z8673 Personal history of transient ischemic attack (TIA), and cerebral infarction without residual deficits: Secondary | ICD-10-CM

## 2016-11-07 DIAGNOSIS — J9600 Acute respiratory failure, unspecified whether with hypoxia or hypercapnia: Secondary | ICD-10-CM | POA: Diagnosis present

## 2016-11-07 DIAGNOSIS — E7800 Pure hypercholesterolemia, unspecified: Secondary | ICD-10-CM | POA: Diagnosis present

## 2016-11-07 DIAGNOSIS — I5043 Acute on chronic combined systolic (congestive) and diastolic (congestive) heart failure: Secondary | ICD-10-CM | POA: Diagnosis present

## 2016-11-07 DIAGNOSIS — I272 Pulmonary hypertension, unspecified: Secondary | ICD-10-CM | POA: Diagnosis present

## 2016-11-07 DIAGNOSIS — I428 Other cardiomyopathies: Secondary | ICD-10-CM | POA: Diagnosis present

## 2016-11-07 DIAGNOSIS — Z6841 Body Mass Index (BMI) 40.0 and over, adult: Secondary | ICD-10-CM

## 2016-11-07 DIAGNOSIS — M6281 Muscle weakness (generalized): Secondary | ICD-10-CM

## 2016-11-07 DIAGNOSIS — Z87891 Personal history of nicotine dependence: Secondary | ICD-10-CM

## 2016-11-07 DIAGNOSIS — I480 Paroxysmal atrial fibrillation: Secondary | ICD-10-CM | POA: Diagnosis present

## 2016-11-07 DIAGNOSIS — M170 Bilateral primary osteoarthritis of knee: Secondary | ICD-10-CM | POA: Diagnosis present

## 2016-11-07 DIAGNOSIS — Z9851 Tubal ligation status: Secondary | ICD-10-CM

## 2016-11-07 LAB — BASIC METABOLIC PANEL
ANION GAP: 11 (ref 5–15)
BUN: 35 mg/dL — ABNORMAL HIGH (ref 6–20)
CHLORIDE: 96 mmol/L — AB (ref 101–111)
CO2: 31 mmol/L (ref 22–32)
Calcium: 8.4 mg/dL — ABNORMAL LOW (ref 8.9–10.3)
Creatinine, Ser: 1.59 mg/dL — ABNORMAL HIGH (ref 0.44–1.00)
GFR calc non Af Amer: 35 mL/min — ABNORMAL LOW (ref >60)
GFR, EST AFRICAN AMERICAN: 40 mL/min — AB (ref >60)
Glucose, Bld: 111 mg/dL — ABNORMAL HIGH (ref 65–99)
Potassium: 3.5 mmol/L (ref 3.5–5.1)
SODIUM: 138 mmol/L (ref 135–145)

## 2016-11-07 LAB — CBC WITH DIFFERENTIAL/PLATELET
BASOS PCT: 0 %
Basophils Absolute: 0 10*3/uL (ref 0.0–0.1)
EOS ABS: 0.1 10*3/uL (ref 0.0–0.7)
EOS PCT: 1 %
HCT: 28.6 % — ABNORMAL LOW (ref 36.0–46.0)
HEMOGLOBIN: 8.2 g/dL — AB (ref 12.0–15.0)
LYMPHS PCT: 15 %
Lymphs Abs: 1.4 10*3/uL (ref 0.7–4.0)
MCH: 24.8 pg — AB (ref 26.0–34.0)
MCHC: 28.7 g/dL — ABNORMAL LOW (ref 30.0–36.0)
MCV: 86.4 fL (ref 78.0–100.0)
MONO ABS: 0.8 10*3/uL (ref 0.1–1.0)
Monocytes Relative: 8 %
NEUTROS PCT: 76 %
Neutro Abs: 7.2 10*3/uL (ref 1.7–7.7)
PLATELETS: 179 10*3/uL (ref 150–400)
RBC: 3.31 MIL/uL — AB (ref 3.87–5.11)
RDW: 27.4 % — ABNORMAL HIGH (ref 11.5–15.5)
WBC: 9.5 10*3/uL (ref 4.0–10.5)

## 2016-11-07 LAB — INFLUENZA PANEL BY PCR (TYPE A & B)
INFLAPCR: NEGATIVE
INFLBPCR: NEGATIVE

## 2016-11-07 LAB — TROPONIN I: Troponin I: 0.04 ng/mL (ref <0.03)

## 2016-11-07 LAB — I-STAT TROPONIN, ED: Troponin i, poc: 0.03 ng/mL (ref 0.00–0.08)

## 2016-11-07 LAB — I-STAT CG4 LACTIC ACID, ED: LACTIC ACID, VENOUS: 2.01 mmol/L — AB (ref 0.5–1.9)

## 2016-11-07 LAB — I-STAT VENOUS BLOOD GAS, ED
Acid-Base Excess: 11 mmol/L — ABNORMAL HIGH (ref 0.0–2.0)
BICARBONATE: 36.8 mmol/L — AB (ref 20.0–28.0)
O2 Saturation: 86 %
PH VEN: 7.411 (ref 7.250–7.430)
TCO2: 39 mmol/L (ref 0–100)
pCO2, Ven: 57.9 mmHg (ref 44.0–60.0)
pO2, Ven: 52 mmHg — ABNORMAL HIGH (ref 32.0–45.0)

## 2016-11-07 LAB — PROTIME-INR
INR: 2.62
Prothrombin Time: 28.5 seconds — ABNORMAL HIGH (ref 11.4–15.2)

## 2016-11-07 LAB — BRAIN NATRIURETIC PEPTIDE: B NATRIURETIC PEPTIDE 5: 490.4 pg/mL — AB (ref 0.0–100.0)

## 2016-11-07 MED ORDER — IPRATROPIUM-ALBUTEROL 0.5-2.5 (3) MG/3ML IN SOLN
3.0000 mL | Freq: Once | RESPIRATORY_TRACT | Status: AC
  Administered 2016-11-07: 3 mL via RESPIRATORY_TRACT
  Filled 2016-11-07: qty 3

## 2016-11-07 MED ORDER — WARFARIN SODIUM 10 MG PO TABS
10.0000 mg | ORAL_TABLET | Freq: Once | ORAL | Status: AC
  Administered 2016-11-07: 10 mg via ORAL
  Filled 2016-11-07: qty 1

## 2016-11-07 MED ORDER — DOCUSATE SODIUM 100 MG PO CAPS
100.0000 mg | ORAL_CAPSULE | Freq: Two times a day (BID) | ORAL | Status: DC
  Administered 2016-11-07 – 2016-11-13 (×12): 100 mg via ORAL
  Filled 2016-11-07 (×12): qty 1

## 2016-11-07 MED ORDER — OXYCODONE HCL 5 MG PO TABS
5.0000 mg | ORAL_TABLET | Freq: Four times a day (QID) | ORAL | Status: DC | PRN
  Administered 2016-11-08: 5 mg via ORAL
  Filled 2016-11-07: qty 1

## 2016-11-07 MED ORDER — SODIUM CHLORIDE 0.9 % IV SOLN: 250.0000 mL | INTRAVENOUS | Status: DC | PRN

## 2016-11-07 MED ORDER — CARVEDILOL 6.25 MG PO TABS
6.2500 mg | ORAL_TABLET | Freq: Two times a day (BID) | ORAL | Status: DC
  Administered 2016-11-08 – 2016-11-10 (×6): 6.25 mg via ORAL
  Filled 2016-11-07 (×6): qty 1

## 2016-11-07 MED ORDER — METHYLPREDNISOLONE SODIUM SUCC 125 MG IJ SOLR
60.0000 mg | Freq: Four times a day (QID) | INTRAMUSCULAR | Status: DC
  Administered 2016-11-07 – 2016-11-09 (×7): 60 mg via INTRAVENOUS
  Filled 2016-11-07 (×7): qty 2

## 2016-11-07 MED ORDER — DILTIAZEM HCL ER COATED BEADS 180 MG PO CP24
180.0000 mg | ORAL_CAPSULE | Freq: Every day | ORAL | Status: DC
Start: 2016-11-08 — End: 2016-11-13
  Administered 2016-11-08 – 2016-11-13 (×6): 180 mg via ORAL
  Filled 2016-11-07 (×7): qty 1

## 2016-11-07 MED ORDER — FENTANYL CITRATE (PF) 100 MCG/2ML IJ SOLN
50.0000 ug | Freq: Once | INTRAMUSCULAR | Status: AC
  Administered 2016-11-07: 50 ug via INTRAVENOUS
  Filled 2016-11-07: qty 2

## 2016-11-07 MED ORDER — WARFARIN - PHARMACIST DOSING INPATIENT
Freq: Every day | Status: DC
  Administered 2016-11-07 – 2016-11-08 (×2)

## 2016-11-07 MED ORDER — LEVOFLOXACIN 500 MG PO TABS: 500.0000 mg | ORAL_TABLET | Freq: Every day | ORAL | Status: DC

## 2016-11-07 MED ORDER — SODIUM CHLORIDE 0.9% FLUSH
3.0000 mL | Freq: Two times a day (BID) | INTRAVENOUS | Status: DC
  Administered 2016-11-07 – 2016-11-13 (×10): 3 mL via INTRAVENOUS

## 2016-11-07 MED ORDER — POLYETHYLENE GLYCOL 3350 17 G PO PACK: 17.0000 g | PACK | Freq: Every day | ORAL | Status: DC | PRN

## 2016-11-07 MED ORDER — METHYLPREDNISOLONE SODIUM SUCC 125 MG IJ SOLR
125.0000 mg | Freq: Once | INTRAMUSCULAR | Status: AC
  Administered 2016-11-07: 125 mg via INTRAVENOUS
  Filled 2016-11-07: qty 2

## 2016-11-07 MED ORDER — LEVOFLOXACIN 500 MG PO TABS
500.0000 mg | ORAL_TABLET | ORAL | Status: DC
  Administered 2016-11-08 – 2016-11-09 (×2): 500 mg via ORAL
  Filled 2016-11-07 (×2): qty 1

## 2016-11-07 MED ORDER — FUROSEMIDE 10 MG/ML IJ SOLN
80.0000 mg | INTRAMUSCULAR | Status: AC
  Administered 2016-11-07: 80 mg via INTRAVENOUS
  Filled 2016-11-07: qty 8

## 2016-11-07 MED ORDER — FERROUS SULFATE 325 (65 FE) MG PO TABS
325.0000 mg | ORAL_TABLET | Freq: Three times a day (TID) | ORAL | Status: DC
  Administered 2016-11-08 – 2016-11-10 (×9): 325 mg via ORAL
  Filled 2016-11-07 (×9): qty 1

## 2016-11-07 MED ORDER — HYDRALAZINE HCL 25 MG PO TABS: 25.0000 mg | ORAL_TABLET | Freq: Four times a day (QID) | ORAL | Status: DC | PRN

## 2016-11-07 MED ORDER — GABAPENTIN 300 MG PO CAPS
300.0000 mg | ORAL_CAPSULE | Freq: Every day | ORAL | Status: DC
  Filled 2016-11-07 (×7): qty 1

## 2016-11-07 MED ORDER — FUROSEMIDE 10 MG/ML IJ SOLN
60.0000 mg | Freq: Two times a day (BID) | INTRAMUSCULAR | Status: DC
  Administered 2016-11-08 – 2016-11-10 (×5): 60 mg via INTRAVENOUS
  Filled 2016-11-07 (×7): qty 6

## 2016-11-07 MED ORDER — SODIUM CHLORIDE 0.9% FLUSH: 3.0000 mL | INTRAVENOUS | Status: DC | PRN

## 2016-11-07 MED ORDER — IPRATROPIUM-ALBUTEROL 0.5-2.5 (3) MG/3ML IN SOLN
3.0000 mL | Freq: Four times a day (QID) | RESPIRATORY_TRACT | Status: DC
  Administered 2016-11-08 – 2016-11-10 (×12): 3 mL via RESPIRATORY_TRACT
  Filled 2016-11-07 (×12): qty 3

## 2016-11-07 MED ORDER — BISACODYL 10 MG RE SUPP: 10.0000 mg | Freq: Every day | RECTAL | Status: DC | PRN

## 2016-11-07 MED ORDER — ACETAMINOPHEN 325 MG PO TABS: 650.0000 mg | ORAL_TABLET | ORAL | Status: DC | PRN

## 2016-11-07 MED ORDER — PROPAFENONE HCL 150 MG PO TABS
150.0000 mg | ORAL_TABLET | Freq: Three times a day (TID) | ORAL | Status: DC
  Administered 2016-11-07 – 2016-11-13 (×17): 150 mg via ORAL
  Filled 2016-11-07 (×19): qty 1

## 2016-11-07 MED ORDER — OXYCODONE-ACETAMINOPHEN 5-325 MG PO TABS
1.0000 | ORAL_TABLET | Freq: Once | ORAL | Status: AC
  Administered 2016-11-07: 1 via ORAL
  Filled 2016-11-07: qty 1

## 2016-11-07 MED ORDER — ONDANSETRON HCL 4 MG/2ML IJ SOLN: 4.0000 mg | Freq: Four times a day (QID) | INTRAMUSCULAR | Status: DC | PRN

## 2016-11-07 MED ORDER — LEVOFLOXACIN 750 MG PO TABS
750.0000 mg | ORAL_TABLET | Freq: Once | ORAL | Status: AC
  Administered 2016-11-07: 750 mg via ORAL
  Filled 2016-11-07: qty 1

## 2016-11-07 NOTE — Progress Notes (Signed)
CRITICAL VALUE ALERT  Critical value received:  Trop 0.04  Date of notification: 11/07/2016  Time of notification:  2130  Critical value read back:yes  Nurse who received alert:  Doree Albee  MD notified (1st page): Hugelmeyer  Time of first page:  2137  MD notified (2nd page):  Time of second page:  Responding MD: Hugelmeyer  Time MD responded:

## 2016-11-07 NOTE — H&P (Addendum)
TRH H&P    Patient Demographics:    Paige Marshall, is a 61 y.o. female  MRN: [id number]  DOB - 24-Feb-1956  Admit Date - 11/07/2016  Referring MD/NP/PA: Dr. Rex Kras  Outpatient Primary MD for the patient is EDWARDS, Milford Cage, NP  Patient coming from: Skilled nursing facility  Chief Complaint  Patient presents with  . Leg Swelling  . Shortness of Breath      HPI:    Paige Marshall  is a 61 y.o. female, With history of COPD on 3 L oxygen at home, atrial fibrillation on anticoagulation with Coumadin, right heart failure, pulmonary hypertension who came to hospital with worsening bilateral leg swelling with worsening shortness of breath. Patient is currently residing at rehabilitation facility since November 2017, she is wheelchair bound. She denies nausea vomiting or diarrhea. No chest pain. No fever or chills. Denies body aches. Complains of runny nose, coughing up clear phlegm.  In the ED, chest x-ray showed mild pulmonary edema. She is on high-dose Demadex at home.    Review of systems:    In addition to the HPI above,  No Fever-chills, No Headache, No changes with Vision or hearing, No problems swallowing food or Liquids, No Abdominal pain, No Nausea or Vomiting, bowel movements are regular, No Blood in stool or Urine, No dysuria, No new skin rashes or bruises, Positive history of rheumatoid arthritis  No new weakness, tingling, numbness in any extremity, No polyuria, polydypsia or polyphagia, No significant Mental Stressors.  A full 10 point Review of Systems was done, except as stated above, all other Review of Systems were negative.   With Past History of the following :    Past Medical History:  Diagnosis Date  . Arthritis    "both knees" (01/14/2015)  . Asthma   . Chronic combined systolic and diastolic CHF (congestive heart failure) ([place])   . CKD (chronic kidney disease),  stage III   . COPD (chronic obstructive pulmonary disease) ([place])   . Degenerative joint disease of both lower legs   . Edema    Lower extremity  . Former smoker   . Gout    hx in "both knees" (01/14/2015)  . H. pylori infection 01/03/2014   +breath test.    . High cholesterol   . Hypertension   . Morbid obesity ([place])   . NICM (nonischemic cardiomyopathy) ([place])    a. LHC 01/2015: normal cors, EF 45-50%. b. EF 50-55% in 08/2016.  [name] Nonischemic cardiomyopathy ([place])   . Normal coronary arteries   . OSA (obstructive sleep apnea)    "never sent CPAP out" (01/14/2015)  . Paroxysmal atrial fibrillation ([facility])   . Pneumonia X 2  . Pulmonary hypertension   . Respiratory failure ([place])   . Rheumatoid arthritis ([place])   . Stroke [facility])       Past Surgical History:  Procedure Laterality Date  . ANGIOGRAM/LV (CONGENITAL)  2007  . BREATH TEK H PYLORI N/A 12/31/2013   Procedure: BREATH TEK H PYLORI;  Surgeon: Gayland Curry, MD;  [facility]: [facility];  Service: General;  Laterality: N/A;  . CORNEAL TRANSPLANT Right ~ 2010  . CORONARY ANGIOPLASTY WITH STENT PLACEMENT  11/04/2005   "1"  . DOPPLER ECHOCARDIOGRAPHY     2 D   EF of 45%  . EYE SURGERY    . LEFT HEART CATHETERIZATION WITH CORONARY ANGIOGRAM N/A 01/20/2015   Procedure: LEFT HEART CATHETERIZATION WITH CORONARY ANGIOGRAM;  Surgeon: Lorretta Harp, MD;  Location: [facility] CATH LAB;  Service: Cardiovascular;  Laterality: N/A;  . sleep study  2011  . stress myocardial dipyridamole perfusion    . TUBAL LIGATION  1982  . venous duplex ultrasound  2013      Social History:      Social History  Substance Use Topics  . Smoking status: Former Smoker    Packs/day: 0.33    Years: 35.00    Types: Cigarettes    Quit date: 10/11/2008  . Smokeless tobacco: Never Used  . Alcohol use No     Comment: 01/14/2015 "~ 4, 24oz cans beer/wk"       Family History :     Family History  Problem Relation Age of Onset  . Heart disease Mother   . Cancer  Father     colon  . Hypertension Other       Home Medications:   Prior to Admission medications   Medication Sig Start Date End Date Taking? Authorizing Provider  albuterol (PROVENTIL HFA;VENTOLIN HFA) 108 (90 Base) MCG/ACT inhaler Inhale 2 puffs into the lungs every 6 (six) hours as needed for wheezing or shortness of breath. 10/24/15  Yes Deneise Lever, MD  bisacodyl (DULCOLAX) 10 MG suppository Place 10 mg rectally daily as needed (constipation if no results from MOM).   Yes Historical Provider, MD  carvedilol (COREG) 6.25 MG tablet Take 1 tablet (6.25 mg total) by mouth 2 (two) times daily with a meal. 09/23/16  Yes Shanker Kristeen Mans, MD  diclofenac sodium (VOLTAREN) 1 % GEL Apply 4 g topically every 6 (six) hours as needed (pain).    Yes Historical Provider, MD  diltiazem (DILACOR XR) 180 MG 24 hr capsule Take 180 mg by mouth daily.   Yes Historical Provider, MD  Hypromellose (ARTIFICIAL TEARS) 0.4 % SOLN Place 1 drop into both eyes every 6 (six) hours as needed (dry eyes).   Yes Historical Provider, MD  Multiple Vitamin (MULTIVITAMIN WITH MINERALS) TABS tablet Take 1 tablet by mouth daily.   Yes Historical Provider, MD  omega-3 acid ethyl esters (LOVAZA) 1 g capsule Take 1 g by mouth 2 (two) times daily.   Yes Historical Provider, MD  oseltamivir (TAMIFLU) 75 MG capsule Take 75 mg by mouth daily. Order date: 11/03/16 - give until 11/14/16   Yes Historical Provider, MD  oxyCODONE (OXY IR/ROXICODONE) 5 MG immediate release tablet Take 1 tablet (5 mg total) by mouth every 6 (six) hours as needed for breakthrough pain. Patient taking differently: Take 5 mg by mouth every 6 (six) hours as needed for moderate pain or severe pain.  09/23/16  Yes Shanker Kristeen Mans, MD  polyethylene glycol (MIRALAX / GLYCOLAX) packet Take 17 g by mouth daily as needed. Patient taking differently: Take 17 g by mouth daily as needed (constipation). Mix in 8 oz liquid and drink 09/23/16  Yes Shanker Kristeen Mans, MD    PRESCRIPTION MEDICATION Inhale into the lungs at bedtime. BIPAP   Yes Historical Provider, MD  promethazine (PHENERGAN) 25 MG tablet Take 25 mg by mouth every 6 (six)  hours as needed for nausea or vomiting.   Yes Historical Provider, MD  propafenone (RYTHMOL) 150 MG tablet Take 150 mg by mouth every 8 (eight) hours.   Yes Historical Provider, MD  senna-docusate (SENNA S) 8.6-50 MG tablet Take 2 tablets by mouth See admin instructions. Take 2 tablet by mouth daily at bedtime, may also take 2 tablets as needed for constipation   Yes Historical Provider, MD  torsemide (DEMADEX) 20 MG tablet Take 2 tablets (40 mg total) by mouth daily. For 3 days only (12/9-12/11) Patient taking differently: Take 40 mg by mouth See admin instructions. Order date 11/04/16: take 2 tablets (40 mg) by mouth every morning and afternoon (4pm) 09/23/16  Yes Shanker Kristeen Mans, MD  warfarin (COUMADIN) 10 MG tablet Take 1 tablet (10 mg total) by mouth daily at 6 PM. Started on 10 mg of Coumadin on 12/13-please check INR every day or every other day and adjust dosing of Coumadin accordingly. Patient taking differently: Take 10 mg by mouth See admin instructions. Take 1 tablet (10 mg) by mouth every Sunday and Friday at 5pm (take 7.5 mg on other days) 09/23/16  Yes Shanker Kristeen Mans, MD  warfarin (COUMADIN) 7.5 MG tablet Take 7.5 mg by mouth See admin instructions. Take 1 tablet (7.5 mg) by mouth every Monday, Tuesday, Wednesday, Thursday and Saturday at 5pm (take 10 mg tablet on Sunday and Friday)   Yes Historical Provider, MD  docusate sodium (COLACE) 100 MG capsule Take 1 capsule (100 mg total) by mouth 2 (two) times daily. Patient not taking: Reported on 11/07/2016 09/14/16   Eugenie Filler, MD  ferrous sulfate 325 (65 FE) MG tablet Take 1 tablet (325 mg total) by mouth 3 (three) times daily with meals. Patient not taking: Reported on 11/07/2016 09/23/16   Jonetta Osgood, MD  gabapentin (NEURONTIN) 300 MG capsule Take 1 capsule  (300 mg total) by mouth at bedtime. Patient not taking: Reported on 11/07/2016 08/31/16   Janece Canterbury, MD  OXYGEN Inhale 3 L into the lungs continuous.    Historical Provider, MD     Allergies:     Allergies  Allergen Reactions  . Penicillins Hives, Itching, Swelling and Other (See Comments)    Tongue swelling Has patient had a PCN reaction causing immediate rash, facial/tongue/throat swelling, SOB or lightheadedness with hypotension: Yes  Clarified with the patient her penicillin allergy. Pt has tolerated cefepime in the past. Pt also states that she can take amoxicillin.    . Citrus Hives     Physical Exam:   Vitals  Blood pressure 115/59, pulse 109, temperature 97.9 F (36.6 C), temperature source Oral, resp. rate 18, height 5\' 7"  (1.702 m), weight (!) 160.6 kg (354 lb), last menstrual period 09/07/2011, SpO2 100 %.  1.  General: Morbidly obese female in mild respiratory distress  2. Psychiatric:  Intact judgement and  insight, awake alert, oriented x 3.  3. Neurologic: No focal neurological deficits, all cranial nerves intact.Strength 5/5 all 4 extremities, sensation intact all 4 extremities, plantars down going.  4. Eyes :  anicteric sclerae, moist conjunctivae with no lid lag. PERRLA.  5. ENMT:  Oropharynx clear with moist mucous membranes and good dentition  6. Neck:  supple, no cervical lymphadenopathy appriciated, No thyromegaly  7. Respiratory : Normal respiratory effort, decreased breath sounds at lung bases, scattered wheezing  8. Cardiovascular : RRR, no gallops, rubs or murmurs, bilateral 2+ pitting edema lower extremities  9. Gastrointestinal:  Positive bowel sounds, abdomen  soft, non-tender to palpation,no hepatosplenomegaly, no rigidity or guarding       10. Skin:  No cyanosis, normal texture and turgor, no rash, lesions or ulcers  11.Musculoskeletal:  Good muscle tone,  joints appear normal , no effusions,  normal range of motion     Data Review:    CBC  Recent Labs Lab 11/07/16 1633  WBC 9.5  HGB 8.2*  HCT 28.6*  PLT 179  MCV 86.4  MCH 24.8*  MCHC 28.7*  RDW 27.4*  LYMPHSABS 1.4  MONOABS 0.8  EOSABS 0.1  BASOSABS 0.0   ------------------------------------------------------------------------------------------------------------------  Chemistries   Recent Labs Lab 11/07/16 1633  NA 138  K 3.5  CL 96*  CO2 31  GLUCOSE 111*  BUN 35*  CREATININE 1.59*  CALCIUM 8.4*   ------------------------------------------------------------------------------------------------------------------  Coagulation Profile:  Recent Labs Lab 11/07/16 1633  INR 2.62    --------------------------------------------------------------------------------------------------------------- Urine analysis:    Component Value Date/Time   COLORURINE YELLOW 09/13/2016 0225   APPEARANCEUR CLEAR 09/13/2016 0225   LABSPEC 1.017 09/13/2016 0225   PHURINE 5.0 09/13/2016 0225   GLUCOSEU NEGATIVE 09/13/2016 0225   HGBUR NEGATIVE 09/13/2016 0225   BILIRUBINUR NEGATIVE 09/13/2016 0225   KETONESUR NEGATIVE 09/13/2016 0225   PROTEINUR NEGATIVE 09/13/2016 0225   UROBILINOGEN 1.0 11/16/2009 0554   NITRITE NEGATIVE 09/13/2016 0225   LEUKOCYTESUR NEGATIVE 09/13/2016 0225      Imaging Results:    Dg Chest Port 1 View  Result Date: 11/07/2016 CLINICAL DATA:  Patient with shortness of breath and congestive heart failure. EXAM: PORTABLE CHEST 1 VIEW COMPARISON:  Chest radiograph 09/18/2016. FINDINGS: Stable enlarged cardiac and mediastinal contours. Interval development of heterogeneous opacities within the mid and lower lungs bilaterally. Probable small left pleural effusion. IMPRESSION: Cardiomegaly and basilar opacities favored to represent mild edema. Possible small left pleural effusion. Electronically Signed   By: Lovey Newcomer M.D.   On: 11/07/2016 17:00    My personal review of EKG: Rhythm - Sinus tachycardia with T-wave  inversions in lateral leads V5, V6   Assessment & Plan:    Active Problems:   PAF (paroxysmal atrial fibrillation) (HCC)   Obesity hypoventilation syndrome (HCC)   Uncontrolled hypertension   COPD mixed type (HCC)   CKD (chronic kidney disease) stage 4, GFR 15-29 ml/min (HCC)   Pulmonary hypertension   Right heart failure, NYHA class 3   Acute on chronic respiratory failure (HCC)   Acute exacerbation of CHF (congestive heart failure) ([place])   1. Acute right heart failure- patient has history of right heart failure, with echocardiogram showed EF 50-55%, with dilation of right ventricle and reduction of systolic function. She received Lasix 80 mg IV in the ED. We'll start Lasix 60 mg IV every 12 hours from tomorrow morning. Will check daily weights. Intake and output. Follow BMP in a.m. EKG shows nonspecific ST changes, with cycle troponin every 6 hours 3. 2. COPD exacerbation- patient has history of COPD, will start Solu-Medrol 60 mg IV every 6 hours, DuoNeb nebs every 6 hours. Continue Levaquin 500 mg by mouth daily. 3. Paroxysmal atrial fibrillation- CHA2DS2VASc score is 3, patient is currently on Coumadin for anticoagulation. INR is therapeutic. We'll consult pharmacy to dose Coumadin. Continue Cardizem, Rythmol. 4. Chronic kidney disease stage III- patient's creatinine 1.59, currently at baseline. Started on high-dose Lasix. Follow BMP in a.m. 5. Anemia of chronic disease- secondary to chronic kidney disease stage III. Patient baseline hemoglobin stays around 7. Today hemoglobin is 8.2. Will check CBC in a.m.  6. Obesity hypoventilation syndrome- continue BiPAP daily at bedtime 7. History of left fibular fracture-patient sustained left fibular fracture in December, patient is currently at rehabilitation facility 8. Hypertension- blood pressure was elevated initially when patient came to ED. Continue diltiazem, Coreg. Will start hydralazine 25 mg every 6 hours when necessary for BP more than  160/100.    DVT Prophylaxis-   Coumadin  AM Labs Ordered, also please review Full Orders  Family Communication: Admission, patients condition and plan of care including tests being ordered have been discussed with the patient  who indicate understanding and agree with the plan and Code Status.  Code Status:  Full code  Admission status  - observation  Time spent in minutes : 60 minutes   Sabree Nuon S M.D on 11/07/2016 at 6:17 PM  Between 7am to 7pm - Pager - [phone]. After 7pm go to [email or web address] - password [facility]  [facility] - Office  [phone]

## 2016-11-07 NOTE — ED Triage Notes (Signed)
Pt IB [facility] from [facility] facility where she currently resides due to a healing leg fracture. Pt reports that she has orders for albuterol treatments twice daily and has not received them since at facility but when at home she takes them for CHF and SOB. Pt also that she is supposed to have BiPAP nightly and has not received it. Edema X2 days SOB noted by EMS on arrival. Left BBB also noted on arrival. 5 albuterol pta.

## 2016-11-07 NOTE — Progress Notes (Signed)
[place] for warfarin  Indication: atrial fibrillation  Allergies  Allergen Reactions  . Penicillins Hives, Itching, Swelling and Other (See Comments)    Tongue swelling Has patient had a PCN reaction causing immediate rash, facial/tongue/throat swelling, SOB or lightheadedness with hypotension: Yes  Clarified with the patient her penicillin allergy. Pt has tolerated cefepime in the past. Pt also states that she can take amoxicillin.    . Citrus Hives    Patient Measurements: Height: 5\' 7"  (170.2 cm) Weight: (!) 376 lb 11.2 oz (170.9 kg) IBW/kg (Calculated) : 61.6  Vital Signs: Temp: 98.4 F (36.9 C) (01/28 1900) Temp Source: Oral (01/28 1900) BP: 122/81 (01/28 1900) Pulse Rate: 112 (01/28 1900)  Labs:  Recent Labs  11/07/16 1633  HGB 8.2*  HCT 28.6*  PLT 179  LABPROT 28.5*  INR 2.62  CREATININE 1.59*    Estimated Creatinine Clearance: 64.1 mL/min (by C-G formula based on SCr of 1.59 mg/dL (H)).   Medical History: Past Medical History:  Diagnosis Date  . Arthritis    "both knees" (01/14/2015)  . Asthma   . Chronic combined systolic and diastolic CHF (congestive heart failure) ([place])   . CKD (chronic kidney disease), stage III   . COPD (chronic obstructive pulmonary disease) ([place])   . Degenerative joint disease of both lower legs   . Edema    Lower extremity  . Former smoker   . Gout    hx in "both knees" (01/14/2015)  . H. pylori infection 01/03/2014   +breath test.    . High cholesterol   . Hypertension   . Morbid obesity ([place])   . NICM (nonischemic cardiomyopathy) ([place])    a. LHC 01/2015: normal cors, EF 45-50%. b. EF 50-55% in 08/2016.  [name] Nonischemic cardiomyopathy ([place])   . Normal coronary arteries   . OSA (obstructive sleep apnea)    "never sent CPAP out" (01/14/2015)  . Paroxysmal atrial fibrillation (HCC)   . Pneumonia X 2  . Pulmonary hypertension   . Respiratory failure ([place])   . Rheumatoid arthritis  ([place])   . Stroke [facility])      Assessment: 61 yo female with hx of atrial fibrillation on warfarin PTA admitted with COPD exacerbation. INR on admit 2.62 with last dose taken on 1/26. Hgb chronically low but stable and PLTc  179.   Prior to arrival dose of warfarin is 10 mg on Sunday and Friday and 7.5 mg all other days   Goal of Therapy:  INR 2-3 Monitor platelets by anticoagulation protocol: Yes   Plan:  1. Warfarin 10 mg x 1 tonight  2. Daily INR  Vincenza Hews, PharmD, BCPS 11/07/2016, 7:58 PM

## 2016-11-07 NOTE — Progress Notes (Signed)
Pt refusing Bipap at this time because we do not have nasal pillows. Pt states she can not tolerate a nasal mask. RT will continue to monitor

## 2016-11-07 NOTE — ED Notes (Signed)
ED Provider at bedside. 

## 2016-11-07 NOTE — Progress Notes (Signed)
New pt admission from ED. Pt brought to the floor in stable condition. Vitals taken. Initial Assessment done. All immediate pertinent needs to patient addressed. Patient Guide given to patient. Important safety instructions relating to hospitalization reviewed with patient. Patient verbalized understanding. Will continue to monitor pt. 

## 2016-11-07 NOTE — ED Provider Notes (Signed)
[facility] DEPT Provider Note   CSN: [id number] Arrival date & time: 11/07/16 1512     History    Chief Complaint  Patient presents with  . Leg Swelling  . Shortness of Breath     HPI Paige Marshall is a 61 y.o. female.  61yo F w/ extensive PMH including COPD on 3L home O2, A fib, CHF, and recent hospitalization who p/w shortness of breath and leg swelling. She has had 3 days of increasing SOB, cough w/ yellow sputum production, nasal congestion, and increasing b/l leg swelling. She stopped using bipap 1 week ago because it was uncomfortable. Her sx are similar to previous hospitalization.  She never lies flat to sleep. She feels like her abdomen may be slightly bigger than normal. She denies chest pain, palpitations, fevers.  She has been at a nursing facility since Nov for leg fracture, is wheelchair bound. She notes that she has not had breathing treatments for a long time. Of note, hospitalized 1 month ago for acute on chronic respiratory failure, multifactorial but some degree of acute diastolic CHF.   Past Medical History:  Diagnosis Date  . Arthritis    "both knees" (01/14/2015)  . Asthma   . Chronic combined systolic and diastolic CHF (congestive heart failure) ([place])   . CKD (chronic kidney disease), stage III   . COPD (chronic obstructive pulmonary disease) ([place])   . Degenerative joint disease of both lower legs   . Edema    Lower extremity  . Former smoker   . Gout    hx in "both knees" (01/14/2015)  . H. pylori infection 01/03/2014   +breath test.    . High cholesterol   . Hypertension   . Morbid obesity ([place])   . NICM (nonischemic cardiomyopathy) ([place])    a. LHC 01/2015: normal cors, EF 45-50%. b. EF 50-55% in 08/2016.  [name] Nonischemic cardiomyopathy ([place])   . Normal coronary arteries   . OSA (obstructive sleep apnea)    "never sent CPAP out" (01/14/2015)  . Paroxysmal atrial fibrillation (HCC)   . Pneumonia X 2  . Pulmonary hypertension   . Respiratory  failure ([place])   . Rheumatoid arthritis ([place])   . Stroke [facility])      Patient Active Problem List   Diagnosis Date Noted  . Acute renal failure superimposed on chronic kidney disease ([place])   . Acute respiratory failure with hypoxemia ([place]) 09/18/2016  . Orthostatic hypotension   . Bradycardia   . Acute on chronic combined systolic and diastolic CHF (congestive heart failure) ([place])   . Anemia 09/06/2016  . Left fibular fracture 09/06/2016  . SOB (shortness of breath)   . Acute kidney injury superimposed on CKD ([place]) 08/31/2016  . NICM (nonischemic cardiomyopathy) ([place]) 08/31/2016  . Pulmonary hypertension 08/31/2016  . Right heart failure, NYHA class 3 08/31/2016  . Acute on chronic diastolic CHF (congestive heart failure) ([place]) 08/29/2016  . 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 Syncope   . AKI (acute kidney injury) ([place]) 03/08/2016  . Acute on chronic renal failure ([place]) 03/08/2016  . Chronic respiratory failure with hypoxia ([place]) 10/24/2015  . COPD mixed type ([place]) 10/10/2015  . 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 Uncontrolled hypertension   . Obesity hypoventilation syndrome ([place]) 01/14/2015  . Obstructive sleep apnea 09/28/2013  . Morbid obesity ([place]) 09/28/2013  . PAF (paroxysmal atrial fibrillation) ([place])  01/09/2010  . Dyslipidemia 10/17/2006  . Essential hypertension 10/17/2006  . VENTRICULAR HYPERTROPHY, LEFT 10/17/2006  . DYSFUNCTIONAL UTERINE BLEEDING 10/17/2006    Past Surgical History:  Procedure Laterality Date  . ANGIOGRAM/LV (CONGENITAL)  2007  . BREATH TEK H PYLORI N/A 12/31/2013   Procedure: [place];  Surgeon: Gayland Curry, MD;  Location: [facility];  Service: General;  Laterality: N/A;  . CORNEAL TRANSPLANT Right ~ 2010  . CORONARY ANGIOPLASTY WITH STENT PLACEMENT  11/04/2005   "1"  . DOPPLER ECHOCARDIOGRAPHY     2 D   EF of 45%  . EYE  SURGERY    . LEFT HEART CATHETERIZATION WITH CORONARY ANGIOGRAM N/A 01/20/2015   Procedure: LEFT HEART CATHETERIZATION WITH CORONARY ANGIOGRAM;  Surgeon: Lorretta Harp, MD;  Location: [facility] CATH LAB;  Service: Cardiovascular;  Laterality: N/A;  . sleep study  2011  . stress myocardial dipyridamole perfusion    . TUBAL LIGATION  1982  . venous duplex ultrasound  2013    OB History    No data available        Home Medications    Prior to Admission medications   Medication Sig Start Date End Date Taking? Authorizing Provider  albuterol (PROVENTIL HFA;VENTOLIN HFA) 108 (90 Base) MCG/ACT inhaler Inhale 2 puffs into the lungs every 6 (six) hours as needed for wheezing or shortness of breath. 10/24/15  Yes Deneise Lever, MD  bisacodyl (DULCOLAX) 10 MG suppository Place 10 mg rectally daily as needed (constipation if no results from MOM).   Yes Historical Provider, MD  carvedilol (COREG) 6.25 MG tablet Take 1 tablet (6.25 mg total) by mouth 2 (two) times daily with a meal. 09/23/16  Yes Shanker Kristeen Mans, MD  diclofenac sodium (VOLTAREN) 1 % GEL Apply 4 g topically every 6 (six) hours as needed (pain).    Yes Historical Provider, MD  diltiazem (DILACOR XR) 180 MG 24 hr capsule Take 180 mg by mouth daily.   Yes Historical Provider, MD  Hypromellose (ARTIFICIAL TEARS) 0.4 % SOLN Place 1 drop into both eyes every 6 (six) hours as needed (dry eyes).   Yes Historical Provider, MD  Multiple Vitamin (MULTIVITAMIN WITH MINERALS) TABS tablet Take 1 tablet by mouth daily.   Yes Historical Provider, MD  omega-3 acid ethyl esters (LOVAZA) 1 g capsule Take 1 g by mouth 2 (two) times daily.   Yes Historical Provider, MD  oseltamivir (TAMIFLU) 75 MG capsule Take 75 mg by mouth daily. Order date: 11/03/16 - give until 11/14/16   Yes Historical Provider, MD  oxyCODONE (OXY IR/ROXICODONE) 5 MG immediate release tablet Take 1 tablet (5 mg total) by mouth every 6 (six) hours as needed for breakthrough pain. Patient  taking differently: Take 5 mg by mouth every 6 (six) hours as needed for moderate pain or severe pain.  09/23/16  Yes Shanker Kristeen Mans, MD  polyethylene glycol (MIRALAX / GLYCOLAX) packet Take 17 g by mouth daily as needed. Patient taking differently: Take 17 g by mouth daily as needed (constipation). Mix in 8 oz liquid and drink 09/23/16  Yes Shanker Kristeen Mans, MD  PRESCRIPTION MEDICATION Inhale into the lungs at bedtime. BIPAP   Yes Historical Provider, MD  promethazine (PHENERGAN) 25 MG tablet Take 25 mg by mouth every 6 (six) hours as needed for nausea or vomiting.   Yes Historical Provider, MD  propafenone (RYTHMOL) 150 MG tablet Take 150 mg by mouth every 8 (eight) hours.   Yes Historical Provider,  MD  senna-docusate (SENNA S) 8.6-50 MG tablet Take 2 tablets by mouth See admin instructions. Take 2 tablet by mouth daily at bedtime, may also take 2 tablets as needed for constipation   Yes Historical Provider, MD  torsemide (DEMADEX) 20 MG tablet Take 2 tablets (40 mg total) by mouth daily. For 3 days only (12/9-12/11) Patient taking differently: Take 40 mg by mouth See admin instructions. Order date 11/04/16: take 2 tablets (40 mg) by mouth every morning and afternoon (4pm) 09/23/16  Yes Shanker Kristeen Mans, MD  warfarin (COUMADIN) 10 MG tablet Take 1 tablet (10 mg total) by mouth daily at 6 PM. Started on 10 mg of Coumadin on 12/13-please check INR every day or every other day and adjust dosing of Coumadin accordingly. Patient taking differently: Take 10 mg by mouth See admin instructions. Take 1 tablet (10 mg) by mouth every Sunday and Friday at 5pm (take 7.5 mg on other days) 09/23/16  Yes Shanker Kristeen Mans, MD  warfarin (COUMADIN) 7.5 MG tablet Take 7.5 mg by mouth See admin instructions. Take 1 tablet (7.5 mg) by mouth every Monday, Tuesday, Wednesday, Thursday and Saturday at 5pm (take 10 mg tablet on Sunday and Friday)   Yes Historical Provider, MD  docusate sodium (COLACE) 100 MG capsule Take 1  capsule (100 mg total) by mouth 2 (two) times daily. Patient not taking: Reported on 11/07/2016 09/14/16   Eugenie Filler, MD  ferrous sulfate 325 (65 FE) MG tablet Take 1 tablet (325 mg total) by mouth 3 (three) times daily with meals. Patient not taking: Reported on 11/07/2016 09/23/16   Jonetta Osgood, MD  gabapentin (NEURONTIN) 300 MG capsule Take 1 capsule (300 mg total) by mouth at bedtime. Patient not taking: Reported on 11/07/2016 08/31/16   Janece Canterbury, MD  OXYGEN Inhale 3 L into the lungs continuous.    Historical Provider, MD      Family History  Problem Relation Age of Onset  . Heart disease Mother   . Cancer Father     colon  . Hypertension Other      Social History  Substance Use Topics  . Smoking status: Former Smoker    Packs/day: 0.33    Years: 35.00    Types: Cigarettes    Quit date: 10/11/2008  . Smokeless tobacco: Never Used  . Alcohol use No     Comment: 01/14/2015 "~ 4, 24oz cans beer/wk"     Allergies     Penicillins and Citrus    Review of Systems  10 Systems reviewed and are negative for acute change except as noted in the HPI.   Physical Exam Updated Vital Signs BP (!) 189/177   Pulse 106   Temp 97.9 F (36.6 C) (Oral)   Resp 22   Ht 5\' 7"  (1.702 m)   Wt (!) 354 lb (160.6 kg)   LMP 09/07/2011   SpO2 92%   BMI 55.44 kg/m   Physical Exam  Constitutional: She is oriented to person, place, and time. She appears well-developed and well-nourished. No distress.  HENT:  Head: Normocephalic and atraumatic.  Moist mucous membranes  Eyes: Conjunctivae are normal. Pupils are equal, round, and reactive to light.  Neck: Neck supple.  Cardiovascular: Regular rhythm and normal heart sounds.  Tachycardia present.   No murmur heard. Pulmonary/Chest: Effort normal.  Diminished BS b/l, expiratory wheezes anteriorly b/l  Abdominal: Soft. Bowel sounds are normal. She exhibits distension. There is no tenderness.  Mild abdominal distension  Musculoskeletal: She exhibits edema.  4+ pitting edema b/l LE to thighs  Neurological: She is alert and oriented to person, place, and time.  Fluent speech  Skin: Skin is warm and dry. No rash noted. No erythema.  Psychiatric: She has a normal mood and affect. Judgment normal.  Nursing note and vitals reviewed.     ED Treatments / Results  Labs (all labs ordered are listed, but only abnormal results are displayed) Labs Reviewed  I-STAT CG4 LACTIC ACID, ED - Abnormal; Notable for the following:       Result Value   Lactic Acid, Venous 2.01 (*)    All other components within normal limits  BASIC METABOLIC PANEL  CBC WITH DIFFERENTIAL/PLATELET  BRAIN NATRIURETIC PEPTIDE  BLOOD GAS, VENOUS  PROTIME-INR  I-STAT TROPOININ, ED     EKG  EKG Interpretation  Date/Time:  Sunday November 07 2016 15:28:46 EST Ventricular Rate:  106 PR Interval:    QRS Duration: 153 QT Interval:  388 QTC Calculation: 516 R Axis:   180 Text Interpretation:  Sinus tachycardia Ventricular premature complex Nonspecific intraventricular conduction delay Consider anterior infarct Borderline ST depression, diffuse leads tachycardia new from previous intraventricular conduction delay is new from previous ST depression V5-V6 Confirmed by Margree Gimbel MD, Elysha Daw [id number]) on 11/07/2016 4:07:36 PM         Radiology No results found.  Procedures Procedures (including critical care time) Procedures  Medications Ordered in ED  Medications  oxyCODONE-acetaminophen (PERCOCET/ROXICET) 5-325 MG per tablet 1 tablet (1 tablet Oral Given 11/07/16 1627)     Initial Impression / Assessment and Plan / ED Course  I have reviewed the triage vital signs and the nursing notes.  Pertinent labs & imaging results that were available during my care of the patient were reviewed by me and considered in my medical decision making (see chart for details).    PT w/ extensive hx including COPD on O2 and CHF p/w Several days of  worsening shortness of breath associated with productive cough and bilateral leg swelling. She was nontoxic and in no respiratory distress at presentation. Afebrile, mild hypertension, O2 92% on 3L. Expiratory wheezes on exam without severe increase in WOB. Significant pitting edema on exam. Gave the patient Solu-Medrol, levaquin, DuoNeb, 80 mg IV Lasix, Percocet, and later fentanyl for leg pain. Lactate weakly positive at 2.01, creatinine 1.59, hemoglobin 8.2, INR therapeutic at 2.6. Cr and Hbg stable from previous. Chest x-ray shows cardiomegaly and mild edema in bilateral bases. I considered PE given her immobilization however she is therapeutic on Coumadin and I feel that she has more evidence of CHF exacerbation and possible COPD exacerbation due to URI.   Given patient's acute on chronic respiratory failure with evidence of volume overload, discussed admission with [facility] hospitalist, Dr. Darrick Meigs. I appreciate his assistance with patient's care. Patient admitted for diuresis and further treatment. Final Clinical Impressions(s) / ED Diagnoses   Final diagnoses:  Acute on chronic respiratory failure with hypoxia (HCC)  Acute on chronic congestive heart failure, unspecified congestive heart failure type ([place])  COPD exacerbation (HCC)     New Prescriptions   No medications on file       Sharlett Iles, MD 11/07/16 1751

## 2016-11-08 DIAGNOSIS — J9621 Acute and chronic respiratory failure with hypoxia: Secondary | ICD-10-CM | POA: Diagnosis present

## 2016-11-08 DIAGNOSIS — I959 Hypotension, unspecified: Secondary | ICD-10-CM | POA: Diagnosis not present

## 2016-11-08 DIAGNOSIS — K5900 Constipation, unspecified: Secondary | ICD-10-CM | POA: Diagnosis not present

## 2016-11-08 DIAGNOSIS — Z947 Corneal transplant status: Secondary | ICD-10-CM | POA: Diagnosis not present

## 2016-11-08 DIAGNOSIS — I428 Other cardiomyopathies: Secondary | ICD-10-CM | POA: Diagnosis present

## 2016-11-08 DIAGNOSIS — E119 Type 2 diabetes mellitus without complications: Secondary | ICD-10-CM | POA: Diagnosis not present

## 2016-11-08 DIAGNOSIS — Z7901 Long term (current) use of anticoagulants: Secondary | ICD-10-CM | POA: Diagnosis not present

## 2016-11-08 DIAGNOSIS — J989 Respiratory disorder, unspecified: Secondary | ICD-10-CM | POA: Diagnosis not present

## 2016-11-08 DIAGNOSIS — R0602 Shortness of breath: Secondary | ICD-10-CM | POA: Diagnosis not present

## 2016-11-08 DIAGNOSIS — I5023 Acute on chronic systolic (congestive) heart failure: Secondary | ICD-10-CM | POA: Diagnosis not present

## 2016-11-08 DIAGNOSIS — M84364D Stress fracture, left fibula, subsequent encounter for fracture with routine healing: Secondary | ICD-10-CM | POA: Diagnosis not present

## 2016-11-08 DIAGNOSIS — I130 Hypertensive heart and chronic kidney disease with heart failure and stage 1 through stage 4 chronic kidney disease, or unspecified chronic kidney disease: Secondary | ICD-10-CM | POA: Diagnosis present

## 2016-11-08 DIAGNOSIS — Z993 Dependence on wheelchair: Secondary | ICD-10-CM | POA: Diagnosis not present

## 2016-11-08 DIAGNOSIS — N183 Chronic kidney disease, stage 3 (moderate): Secondary | ICD-10-CM | POA: Diagnosis not present

## 2016-11-08 DIAGNOSIS — I509 Heart failure, unspecified: Secondary | ICD-10-CM | POA: Diagnosis not present

## 2016-11-08 DIAGNOSIS — I10 Essential (primary) hypertension: Secondary | ICD-10-CM | POA: Diagnosis not present

## 2016-11-08 DIAGNOSIS — Z87891 Personal history of nicotine dependence: Secondary | ICD-10-CM | POA: Diagnosis not present

## 2016-11-08 DIAGNOSIS — Z79899 Other long term (current) drug therapy: Secondary | ICD-10-CM | POA: Diagnosis not present

## 2016-11-08 DIAGNOSIS — R609 Edema, unspecified: Secondary | ICD-10-CM | POA: Diagnosis not present

## 2016-11-08 DIAGNOSIS — D631 Anemia in chronic kidney disease: Secondary | ICD-10-CM | POA: Diagnosis not present

## 2016-11-08 DIAGNOSIS — E662 Morbid (severe) obesity with alveolar hypoventilation: Secondary | ICD-10-CM | POA: Diagnosis not present

## 2016-11-08 DIAGNOSIS — Z8249 Family history of ischemic heart disease and other diseases of the circulatory system: Secondary | ICD-10-CM | POA: Diagnosis not present

## 2016-11-08 DIAGNOSIS — Z6841 Body Mass Index (BMI) 40.0 and over, adult: Secondary | ICD-10-CM | POA: Diagnosis not present

## 2016-11-08 DIAGNOSIS — I2729 Other secondary pulmonary hypertension: Secondary | ICD-10-CM | POA: Diagnosis present

## 2016-11-08 DIAGNOSIS — R110 Nausea: Secondary | ICD-10-CM | POA: Diagnosis not present

## 2016-11-08 DIAGNOSIS — Z9981 Dependence on supplemental oxygen: Secondary | ICD-10-CM | POA: Diagnosis not present

## 2016-11-08 DIAGNOSIS — Z9851 Tubal ligation status: Secondary | ICD-10-CM | POA: Diagnosis not present

## 2016-11-08 DIAGNOSIS — E669 Obesity, unspecified: Secondary | ICD-10-CM | POA: Diagnosis not present

## 2016-11-08 DIAGNOSIS — M170 Bilateral primary osteoarthritis of knee: Secondary | ICD-10-CM | POA: Diagnosis present

## 2016-11-08 DIAGNOSIS — N184 Chronic kidney disease, stage 4 (severe): Secondary | ICD-10-CM | POA: Diagnosis not present

## 2016-11-08 DIAGNOSIS — Z8673 Personal history of transient ischemic attack (TIA), and cerebral infarction without residual deficits: Secondary | ICD-10-CM | POA: Diagnosis not present

## 2016-11-08 DIAGNOSIS — I5082 Biventricular heart failure: Secondary | ICD-10-CM | POA: Diagnosis present

## 2016-11-08 DIAGNOSIS — D638 Anemia in other chronic diseases classified elsewhere: Secondary | ICD-10-CM | POA: Diagnosis present

## 2016-11-08 DIAGNOSIS — J441 Chronic obstructive pulmonary disease with (acute) exacerbation: Secondary | ICD-10-CM | POA: Diagnosis present

## 2016-11-08 DIAGNOSIS — G4733 Obstructive sleep apnea (adult) (pediatric): Secondary | ICD-10-CM | POA: Diagnosis not present

## 2016-11-08 DIAGNOSIS — Z955 Presence of coronary angioplasty implant and graft: Secondary | ICD-10-CM | POA: Diagnosis not present

## 2016-11-08 DIAGNOSIS — I480 Paroxysmal atrial fibrillation: Secondary | ICD-10-CM | POA: Diagnosis present

## 2016-11-08 DIAGNOSIS — E7800 Pure hypercholesterolemia, unspecified: Secondary | ICD-10-CM | POA: Diagnosis present

## 2016-11-08 DIAGNOSIS — I5043 Acute on chronic combined systolic (congestive) and diastolic (congestive) heart failure: Secondary | ICD-10-CM | POA: Diagnosis present

## 2016-11-08 DIAGNOSIS — M6281 Muscle weakness (generalized): Secondary | ICD-10-CM | POA: Diagnosis not present

## 2016-11-08 DIAGNOSIS — J449 Chronic obstructive pulmonary disease, unspecified: Secondary | ICD-10-CM | POA: Diagnosis not present

## 2016-11-08 LAB — CBC
HCT: 27.1 % — ABNORMAL LOW (ref 36.0–46.0)
Hemoglobin: 7.8 g/dL — ABNORMAL LOW (ref 12.0–15.0)
MCH: 24.7 pg — AB (ref 26.0–34.0)
MCHC: 28.8 g/dL — ABNORMAL LOW (ref 30.0–36.0)
MCV: 85.8 fL (ref 78.0–100.0)
PLATELETS: 151 10*3/uL (ref 150–400)
RBC: 3.16 MIL/uL — AB (ref 3.87–5.11)
RDW: 26.9 % — ABNORMAL HIGH (ref 11.5–15.5)
WBC: 5.3 10*3/uL (ref 4.0–10.5)

## 2016-11-08 LAB — BASIC METABOLIC PANEL
Anion gap: 11 (ref 5–15)
BUN: 34 mg/dL — AB (ref 6–20)
CO2: 35 mmol/L — ABNORMAL HIGH (ref 22–32)
Calcium: 8.5 mg/dL — ABNORMAL LOW (ref 8.9–10.3)
Chloride: 95 mmol/L — ABNORMAL LOW (ref 101–111)
Creatinine, Ser: 1.6 mg/dL — ABNORMAL HIGH (ref 0.44–1.00)
GFR calc Af Amer: 40 mL/min — ABNORMAL LOW (ref >60)
GFR, EST NON AFRICAN AMERICAN: 34 mL/min — AB (ref >60)
Glucose, Bld: 152 mg/dL — ABNORMAL HIGH (ref 65–99)
POTASSIUM: 3.5 mmol/L (ref 3.5–5.1)
Sodium: 141 mmol/L (ref 135–145)

## 2016-11-08 LAB — TROPONIN I
Troponin I: 0.03 ng/mL (ref <0.03)
Troponin I: 0.03 ng/mL (ref <0.03)

## 2016-11-08 LAB — PROTIME-INR
INR: 2.85
Prothrombin Time: 30.6 seconds — ABNORMAL HIGH (ref 11.4–15.2)

## 2016-11-08 LAB — MRSA PCR SCREENING: MRSA by PCR: NEGATIVE

## 2016-11-08 MED ORDER — COUMADIN BOOK
Freq: Once | Status: AC
  Administered 2016-11-08: 17:00:00
  Filled 2016-11-08: qty 1

## 2016-11-08 MED ORDER — WARFARIN SODIUM 7.5 MG PO TABS
7.5000 mg | ORAL_TABLET | Freq: Once | ORAL | Status: AC
  Administered 2016-11-08: 7.5 mg via ORAL
  Filled 2016-11-08: qty 1

## 2016-11-08 MED ORDER — WARFARIN VIDEO
Freq: Once | Status: AC
  Administered 2016-11-08: 16:00:00

## 2016-11-08 NOTE — Progress Notes (Signed)
Patient refusing the use of BIPAP

## 2016-11-08 NOTE — Discharge Instructions (Signed)

## 2016-11-08 NOTE — Progress Notes (Signed)
Patient is from a SNF and plans to return to SNF at discharge; Soc Worker is aware; Mindi Slicker [facility] [phone]

## 2016-11-08 NOTE — Progress Notes (Signed)
[place] for warfarin  Indication: atrial fibrillation  Allergies  Allergen Reactions  . Penicillins Hives, Itching, Swelling and Other (See Comments)    Tongue swelling Has patient had a PCN reaction causing immediate rash, facial/tongue/throat swelling, SOB or lightheadedness with hypotension: Yes  Clarified with the patient her penicillin allergy. Pt has tolerated cefepime in the past. Pt also states that she can take amoxicillin.    . Citrus Hives    Patient Measurements: Height: 5\' 7"  (170.2 cm) Weight: (!) 379 lb (171.9 kg) (Pt unable to stand.) IBW/kg (Calculated) : 61.6  Vital Signs: Temp: 97.9 F (36.6 C) (01/29 0808) Temp Source: Oral (01/29 0808) BP: 116/74 (01/29 0808) Pulse Rate: 98 (01/29 0808)  Labs:  Recent Labs  11/07/16 1633 11/07/16 1956 11/08/16 0025 11/08/16 0714  HGB 8.2*  --   --  7.8*  HCT 28.6*  --   --  27.1*  PLT 179  --   --  151  LABPROT 28.5*  --   --  30.6*  INR 2.62  --   --  2.85  CREATININE 1.59*  --   --  1.60*  TROPONINI  --  0.04* 0.03* 0.03*    Estimated Creatinine Clearance: 64 mL/min (by C-G formula based on SCr of 1.6 mg/dL (H)).   Assessment: 61 yo female with hx of atrial fibrillation on warfarin PTA admitted with COPD exacerbation. Pharmacy consulted to manage warfarin inpatient. INR is therapeutic at 2.85 today. Will need to watch INR closely with Levaquin. Hgb chronically low but stable and platelets are low normal.   Prior to arrival dose of warfarin is 10 mg on Sunday and Friday and 7.5 mg all other days   Goal of Therapy:  INR 2-3 Monitor platelets by anticoagulation protocol: Yes   Plan:  1. Warfarin 7.5 mg PO tonight  2. Daily INR 3. Monitor for s/sx of bleeding   Renold Genta, PharmD, BCPS Clinical Pharmacist Phone for today - [place] - [phone] 11/08/2016 11:23 AM

## 2016-11-08 NOTE — Progress Notes (Signed)
Patient is refusing the use of BIPAP for tonight. RT informed patient if she changes her mind have RN contact RT. 

## 2016-11-08 NOTE — Progress Notes (Signed)
[facility]  PROGRESS NOTE  Paige Marshall [id number] DOB: [birth year] DOA: 11/07/2016 PCP: Kerin Perna, NP   Brief HPI:    61 y.o. female, With history of COPD on 3 L oxygen at home, atrial fibrillation on anticoagulation with Coumadin, right heart failure, pulmonary hypertension who came to hospital with worsening bilateral leg swelling with worsening shortness of breath. Patient is currently residing at rehabilitation facility since November 2017, she is wheelchair bound. She denies nausea vomiting or diarrhea. No chest pain. No fever or chills. Denies body aches. Complains of runny nose, coughing up clear phlegm.  In the ED, chest x-ray showed mild pulmonary edema. She is on high-dose Demadex at home.   Subjective   This morning patient feels better, diuresed well with IV Lasix   Assessment/Plan:     1. Acute right heart failure- patient has history of right heart failure, with echocardiogram showed EF 50-55%, with dilation of right ventricle and reduction of systolic function. She received Lasix 80 mg IV in the ED. Started on Lasix 60 mg IV every 12 hours from tomorrow morning. Daily weights. Intake and output. Follow BMP in a.m. EKG shows nonspecific ST changes, troponin  3 is negative. 2. COPD exacerbation- patient has history of COPD, continue Solu-Medrol 60 mg IV every 6 hours, DuoNeb nebs every 6 hours. Continue Levaquin 500 mg by mouth daily. 3. Paroxysmal atrial fibrillation- CHA2DS2VASc score is 3, patient is currently on Coumadin for anticoagulation. INR is therapeutic. We'll consult pharmacy to dose Coumadin. Continue Cardizem, Rythmol. 4. Chronic kidney disease stage III- patient's creatinine 1.59, today creatinine is 1.60. Started on high-dose Lasix. Follow BMP in a.m. 5. Anemia of chronic disease- secondary to chronic kidney disease stage III. Patient baseline hemoglobin stays around 7. Today hemoglobin is 7.8. Will check CBC in a.m. 6. Obesity  hypoventilation syndrome- continue BiPAP daily at bedtime 7. History of left fibular fracture-patient sustained left fibular fracture in December, patient is currently at rehabilitation facility 8. Hypertension- blood pressure was elevated initially when patient came to ED. Continue diltiazem, Coreg. Will start hydralazine 25 mg every 6 hours when necessary for BP more than 160/100.     DVT prophylaxis: Patient on warfarin  Code Status: Full code  Family Communication: *No family present at bedside  Disposition Plan: Back to skilled facility once she improves.  Consultants: None  Procedures:  None       Antibiotics:   Anti-infectives    Start     Dose/Rate Route Frequency Ordered Stop   11/08/16 1800  levofloxacin (LEVAQUIN) tablet 500 mg     500 mg Oral Every 24 hours 11/07/16 2000     11/08/16 1000  levofloxacin (LEVAQUIN) tablet 500 mg  Status:  Discontinued     500 mg Oral Daily 11/07/16 1852 11/07/16 2000   11/07/16 1730  levofloxacin (LEVAQUIN) tablet 750 mg     750 mg Oral  Once 11/07/16 1727 11/07/16 1734       Objective   Vitals:   11/08/16 0514 11/08/16 0808 11/08/16 0843 11/08/16 1402  BP: 118/85 116/74    Pulse: (!) 110 98    Resp: 20 20    Temp: 98.5 F (36.9 C) 97.9 F (36.6 C)    TempSrc: Oral Oral    SpO2: 97% 100% 93% 98%  Weight: (!) 171.9 kg (379 lb)     Height:        Intake/Output Summary (Last 24 hours) at 11/08/16 1541 Last data filed at 11/08/16 1442  Gross per 24 hour  Intake              866 ml  Output              950 ml  Net              -84 ml   Filed Weights   11/07/16 1528 11/07/16 1900 11/08/16 0514  Weight: (!) 160.6 kg (354 lb) (!) 170.9 kg (376 lb 11.2 oz) (!) 171.9 kg (379 lb)     Physical Examination:  General exam: Appears calm and comfortable. Respiratory system: Bilateral rhonchi. Respiratory effort normal. Cardiovascular system:  RRR. No  murmurs, rubs, gallops. Bilateral 1+ pitting edema  GI system:  Abdomen is nondistended, soft and nontender. No organomegaly.  Central nervous system. No focal neurological deficits. 5 x 5 power in all extremities. Skin: No rashes, lesions or ulcers. Psychiatry: Alert, oriented x 3.Judgement and insight appear normal. Affect normal.    Data Reviewed: I have personally reviewed following labs and imaging studies  CBG: No results for input(s): GLUCAP in the last 168 hours.  CBC:  Recent Labs Lab 11/07/16 1633 11/08/16 0714  WBC 9.5 5.3  NEUTROABS 7.2  --   HGB 8.2* 7.8*  HCT 28.6* 27.1*  MCV 86.4 85.8  PLT 179 [id number]    Basic Metabolic Panel:  Recent Labs Lab 11/07/16 1633 11/08/16 0714  NA 138 141  K 3.5 3.5  CL 96* 95*  CO2 31 35*  GLUCOSE 111* 152*  BUN 35* 34*  CREATININE 1.59* 1.60*  CALCIUM 8.4* 8.5*    Recent Results (from the past 240 hour(s))  MRSA PCR Screening     Status: None   Collection Time: 11/07/16  8:27 PM  Result Value Ref Range Status   MRSA by PCR NEGATIVE NEGATIVE Final    Comment:        The GeneXpert MRSA Assay (FDA approved for NASAL specimens only), is one component of a comprehensive MRSA colonization surveillance program. It is not intended to diagnose MRSA infection nor to guide or monitor treatment for MRSA infections.      Liver Function Tests: No results for input(s): AST, ALT, ALKPHOS, BILITOT, PROT, ALBUMIN in the last 168 hours. No results for input(s): LIPASE, AMYLASE in the last 168 hours. No results for input(s): AMMONIA in the last 168 hours.  Cardiac Enzymes:  Recent Labs Lab 11/07/16 1956 11/08/16 0025 11/08/16 0714  TROPONINI 0.04* 0.03* 0.03*   BNP (last 3 results)  Recent Labs  09/05/16 1555 09/18/16 1126 11/07/16 1633  BNP 665.3* 991.0* 490.4*    ProBNP (last 3 results) No results for input(s): PROBNP in the last 8760 hours.    Studies: Dg Chest Port 1 View  Result Date: 11/07/2016 CLINICAL DATA:  Patient with shortness of breath and congestive  heart failure. EXAM: PORTABLE CHEST 1 VIEW COMPARISON:  Chest radiograph 09/18/2016. FINDINGS: Stable enlarged cardiac and mediastinal contours. Interval development of heterogeneous opacities within the mid and lower lungs bilaterally. Probable small left pleural effusion. IMPRESSION: Cardiomegaly and basilar opacities favored to represent mild edema. Possible small left pleural effusion. Electronically Signed   By: Lovey Newcomer M.D.   On: 11/07/2016 17:00    Scheduled Meds: . carvedilol  6.25 mg Oral BID WC  . diltiazem  180 mg Oral Daily  . docusate sodium  100 mg Oral BID  . ferrous sulfate  325 mg Oral TID WC  . furosemide  60 mg Intravenous Q12H  .  gabapentin  300 mg Oral QHS  . ipratropium-albuterol  3 mL Nebulization Q6H  . levofloxacin  500 mg Oral Q24H  . methylPREDNISolone (SOLU-MEDROL) injection  60 mg Intravenous Q6H  . propafenone  150 mg Oral Q8H  . sodium chloride flush  3 mL Intravenous Q12H  . warfarin  7.5 mg Oral ONCE-1800  . Warfarin - Pharmacist Dosing Inpatient   Does not apply q1800      Time spent: 25 min  [facility] Pager [phone]. If 7PM-7AM, please contact night-coverage at [email or web address], Office  [phone]  password [email or web address] 11/08/2016, 3:41 PM  LOS: 0 days

## 2016-11-09 LAB — BASIC METABOLIC PANEL
Anion gap: 11 (ref 5–15)
BUN: 40 mg/dL — AB (ref 6–20)
CALCIUM: 8.5 mg/dL — AB (ref 8.9–10.3)
CO2: 33 mmol/L — AB (ref 22–32)
CREATININE: 1.88 mg/dL — AB (ref 0.44–1.00)
Chloride: 95 mmol/L — ABNORMAL LOW (ref 101–111)
GFR calc non Af Amer: 28 mL/min — ABNORMAL LOW (ref >60)
GFR, EST AFRICAN AMERICAN: 33 mL/min — AB (ref >60)
GLUCOSE: 131 mg/dL — AB (ref 65–99)
Potassium: 3.5 mmol/L (ref 3.5–5.1)
Sodium: 139 mmol/L (ref 135–145)

## 2016-11-09 LAB — RETICULOCYTES
RBC.: 3.27 MIL/uL — AB (ref 3.87–5.11)
RETIC COUNT ABSOLUTE: 65.4 10*3/uL (ref 19.0–186.0)
Retic Ct Pct: 2 % (ref 0.4–3.1)

## 2016-11-09 LAB — CBC
HEMATOCRIT: 25.6 % — AB (ref 36.0–46.0)
HEMOGLOBIN: 7.5 g/dL — AB (ref 12.0–15.0)
MCH: 25.2 pg — AB (ref 26.0–34.0)
MCHC: 29.3 g/dL — AB (ref 30.0–36.0)
MCV: 85.9 fL (ref 78.0–100.0)
Platelets: 173 10*3/uL (ref 150–400)
RBC: 2.98 MIL/uL — ABNORMAL LOW (ref 3.87–5.11)
RDW: 26.6 % — AB (ref 11.5–15.5)
WBC: 6.1 10*3/uL (ref 4.0–10.5)

## 2016-11-09 LAB — FERRITIN: FERRITIN: 199 ng/mL (ref 11–307)

## 2016-11-09 LAB — IRON AND TIBC
Iron: 25 ug/dL — ABNORMAL LOW (ref 28–170)
SATURATION RATIOS: 10 % — AB (ref 10.4–31.8)
TIBC: 241 ug/dL — ABNORMAL LOW (ref 250–450)
UIBC: 216 ug/dL

## 2016-11-09 LAB — PROTIME-INR
INR: 4.76
Prothrombin Time: 46 seconds — ABNORMAL HIGH (ref 11.4–15.2)

## 2016-11-09 LAB — FOLATE: Folate: 17.5 ng/mL (ref >5.9)

## 2016-11-09 LAB — VITAMIN B12: Vitamin B-12: 1315 pg/mL — ABNORMAL HIGH (ref 180–914)

## 2016-11-09 MED ORDER — ZOLPIDEM TARTRATE 5 MG PO TABS
5.0000 mg | ORAL_TABLET | Freq: Every evening | ORAL | Status: DC | PRN
  Administered 2016-11-09 – 2016-11-11 (×3): 5 mg via ORAL
  Filled 2016-11-09 (×3): qty 1

## 2016-11-09 MED ORDER — SALINE SPRAY 0.65 % NA SOLN
1.0000 | NASAL | Status: DC | PRN
  Administered 2016-11-09: 1 via NASAL
  Filled 2016-11-09 (×2): qty 44

## 2016-11-09 MED ORDER — METHYLPREDNISOLONE SODIUM SUCC 40 MG IJ SOLR
40.0000 mg | Freq: Four times a day (QID) | INTRAMUSCULAR | Status: DC
  Administered 2016-11-09 – 2016-11-10 (×4): 40 mg via INTRAVENOUS
  Filled 2016-11-09 (×4): qty 1

## 2016-11-09 NOTE — Progress Notes (Signed)
[place] for warfarin  Indication: atrial fibrillation  Allergies  Allergen Reactions  . Penicillins Hives, Itching, Swelling and Other (See Comments)    Tongue swelling Has patient had a PCN reaction causing immediate rash, facial/tongue/throat swelling, SOB or lightheadedness with hypotension: Yes  Clarified with the patient her penicillin allergy. Pt has tolerated cefepime in the past. Pt also states that she can take amoxicillin.    . Citrus Hives    Patient Measurements: Height: 5\' 7"  (170.2 cm) Weight: (!) 374 lb 12.8 oz (170 kg) IBW/kg (Calculated) : 61.6  Vital Signs: Temp: 98.3 F (36.8 C) (01/30 0633) Temp Source: Oral (01/30 [id number]) BP: 123/86 (01/30 [id number]) Pulse Rate: 98 (01/30 0633)  Labs:  Recent Labs  11/07/16 1633 11/07/16 1956 11/08/16 0025 11/08/16 0714 11/09/16 0533  HGB 8.2*  --   --  7.8* 7.5*  HCT 28.6*  --   --  27.1* 25.6*  PLT 179  --   --  151 173  LABPROT 28.5*  --   --  30.6* 46.0*  INR 2.62  --   --  2.85 4.76*  CREATININE 1.59*  --   --  1.60* 1.88*  TROPONINI  --  0.04* 0.03* 0.03*  --     Estimated Creatinine Clearance: 54.1 mL/min (by C-G formula based on SCr of 1.88 mg/dL (H)).   Assessment: 61 yo female with hx of atrial fibrillation on warfarin PTA admitted with COPD exacerbation. Pharmacy consulted to manage warfarin inpatient. INR jumped up significantly to 4.76. CBC is stable and no bleeding noted. Of note, pt is also on levaquin which can increase the INR.    Prior to arrival dose of warfarin is 10 mg on Sunday and Friday and 7.5 mg all other days   Goal of Therapy:  INR 2-3 Monitor platelets by anticoagulation protocol: Yes   Plan:  No warfarin today Daily INR  Salome Arnt, PharmD, BCPS Pager # [phone] 11/09/2016 10:22 AM

## 2016-11-09 NOTE — Progress Notes (Signed)
Pt. Refused bipap. Pt. States she is going to wait til she gets home to contact her provider because she is claustrophobic and feels she needs the nasal prongs or pillows.

## 2016-11-09 NOTE — Progress Notes (Signed)
CRITICAL VALUE ALERT  Critical value received:  INR - 4.76  Date of notification:  11/10/15  Time of notification:  0657  Critical value read back:Yes.    Nurse who received alert:  Kendrick Ranch  MD notified (1st page):  Raliegh Ip Schorr  Time of first page:  [phone]  MD notified (2nd page):  Time of second page:  Responding MD:  awaiting  Time MD responded:  awaiting

## 2016-11-09 NOTE — Progress Notes (Signed)
[facility]  PROGRESS NOTE  Paige Marshall [id number] DOB: [birth year] DOA: 11/07/2016 PCP: Kerin Perna, NP   Brief HPI:    61 y.o. female, With history of COPD on 3 L oxygen at home, atrial fibrillation on anticoagulation with Coumadin, right heart failure, pulmonary hypertension who came to hospital with worsening bilateral leg swelling with worsening shortness of breath. Patient is currently residing at rehabilitation facility since November 2017, she is wheelchair bound. She denies nausea vomiting or diarrhea. No chest pain. No fever or chills. Denies body aches. Complains of runny nose, coughing up clear phlegm.  In the ED, chest x-ray showed mild pulmonary edema. She is on high-dose Demadex at home.   Subjective   This morning patient feels better, diuresed well with IV Lasix. Says the leg edema has improved.   Assessment/Plan:     1. Acute right heart failure- patient has history of right heart failure, with echocardiogram showed EF 50-55%, with dilation of right ventricle and reduction of systolic function. She received Lasix 80 mg IV in the ED. Started on Lasix 60 mg IV every 12 hours  Daily weights. Intake and output. Follow BMP in a.m. EKG shows nonspecific ST changes, troponin  3 is negative.Start Lasix 60  mg po BID at discharge. 2. COPD exacerbation- patient has history of COPD, continue Solu-Medrol 40  mg IV every 6 hours, DuoNeb nebs every 6 hours. Continue Levaquin 500 mg by mouth daily. 3. Paroxysmal atrial fibrillation- CHA2DS2VASc score is 3, patient is currently on Coumadin for anticoagulation. INR is therapeutic. We'll consult pharmacy to dose Coumadin. Continue Cardizem, Rythmol. 4. Chronic kidney disease stage III- patient's creatinine 1.59 on admission, today creatinine is 188. Started on high-dose Lasix. Follow BMP in am. 5. Anemia of chronic disease- secondary to chronic kidney disease stage III. Patient baseline hemoglobin stays around 7.  Today hemoglobin is 7.5. Will check stool for occult blood, anemia panel. 6. Obesity hypoventilation syndrome- continue BiPAP daily at bedtime 7. History of left fibular fracture-patient sustained left fibular fracture in December, patient is currently at rehabilitation facility 8. Hypertension- blood pressure was elevated initially when patient came to ED. Continue diltiazem, Coreg. Will start hydralazine 25 mg every 6 hours when necessary for BP more than 160/100.     DVT prophylaxis: Patient on warfarin  Code Status: Full code  Family Communication: *No family present at bedside  Disposition Plan: Back to skilled facility once she improves.  Consultants: None  Procedures:  None       Antibiotics:   Anti-infectives    Start     Dose/Rate Route Frequency Ordered Stop   11/08/16 1800  levofloxacin (LEVAQUIN) tablet 500 mg     500 mg Oral Every 24 hours 11/07/16 2000     11/08/16 1000  levofloxacin (LEVAQUIN) tablet 500 mg  Status:  Discontinued     500 mg Oral Daily 11/07/16 1852 11/07/16 2000   11/07/16 1730  levofloxacin (LEVAQUIN) tablet 750 mg     750 mg Oral  Once 11/07/16 1727 11/07/16 1734       Objective   Vitals:   11/09/16 0000 11/09/16 0107 11/09/16 0633 11/09/16 0805  BP: 109/76  123/86   Pulse: 85  98   Resp: 20  20   Temp: 97.5 F (36.4 C)  98.3 F (36.8 C)   TempSrc: Oral  Oral   SpO2: 96% 97% 96% 98%  Weight:   (!) 170 kg (374 lb 12.8 oz)   Height:  Intake/Output Summary (Last 24 hours) at 11/09/16 1337 Last data filed at 11/09/16 1117  Gross per 24 hour  Intake              580 ml  Output              975 ml  Net             -395 ml   Filed Weights   11/07/16 1900 11/08/16 0514 11/09/16 0633  Weight: (!) 170.9 kg (376 lb 11.2 oz) (!) 171.9 kg (379 lb) (!) 170 kg (374 lb 12.8 oz)     Physical Examination:  General exam: Appears calm and comfortable. Respiratory system: Clear bilaterally. Respiratory effort  normal. Cardiovascular system:  RRR. No  murmurs, rubs, gallops. Bilateral 1+ pitting edema  GI system: Abdomen is nondistended, soft and nontender. No organomegaly.  Central nervous system. No focal neurological deficits. 5 x 5 power in all extremities. Skin: No rashes, lesions or ulcers. Psychiatry: Alert, oriented x 3.Judgement and insight appear normal. Affect normal.    Data Reviewed: I have personally reviewed following labs and imaging studies  CBG: No results for input(s): GLUCAP in the last 168 hours.  CBC:  Recent Labs Lab 11/07/16 1633 11/08/16 0714 11/09/16 0533  WBC 9.5 5.3 6.1  NEUTROABS 7.2  --   --   HGB 8.2* 7.8* 7.5*  HCT 28.6* 27.1* 25.6*  MCV 86.4 85.8 85.9  PLT 179 151 [id number]    Basic Metabolic Panel:  Recent Labs Lab 11/07/16 1633 11/08/16 0714 11/09/16 0533  NA 138 141 139  K 3.5 3.5 3.5  CL 96* 95* 95*  CO2 31 35* 33*  GLUCOSE 111* 152* 131*  BUN 35* 34* 40*  CREATININE 1.59* 1.60* 1.88*  CALCIUM 8.4* 8.5* 8.5*    Recent Results (from the past 240 hour(s))  MRSA PCR Screening     Status: None   Collection Time: 11/07/16  8:27 PM  Result Value Ref Range Status   MRSA by PCR NEGATIVE NEGATIVE Final    Comment:        The GeneXpert MRSA Assay (FDA approved for NASAL specimens only), is one component of a comprehensive MRSA colonization surveillance program. It is not intended to diagnose MRSA infection nor to guide or monitor treatment for MRSA infections.       Cardiac Enzymes:  Recent Labs Lab 11/07/16 1956 11/08/16 0025 11/08/16 0714  TROPONINI 0.04* 0.03* 0.03*   BNP (last 3 results)  Recent Labs  09/05/16 1555 09/18/16 1126 11/07/16 1633  BNP 665.3* 991.0* 490.4*    ProBNP (last 3 results) No results for input(s): PROBNP in the last 8760 hours.   Studies: Dg Chest Port 1 View  Result Date: 11/07/2016 CLINICAL DATA:  Patient with shortness of breath and congestive heart failure. EXAM: PORTABLE CHEST 1  VIEW COMPARISON:  Chest radiograph 09/18/2016. FINDINGS: Stable enlarged cardiac and mediastinal contours. Interval development of heterogeneous opacities within the mid and lower lungs bilaterally. Probable small left pleural effusion. IMPRESSION: Cardiomegaly and basilar opacities favored to represent mild edema. Possible small left pleural effusion. Electronically Signed   By: Lovey Newcomer M.D.   On: 11/07/2016 17:00    Scheduled Meds: . carvedilol  6.25 mg Oral BID WC  . diltiazem  180 mg Oral Daily  . docusate sodium  100 mg Oral BID  . ferrous sulfate  325 mg Oral TID WC  . furosemide  60 mg Intravenous Q12H  . gabapentin  300 mg  Oral QHS  . ipratropium-albuterol  3 mL Nebulization Q6H  . levofloxacin  500 mg Oral Q24H  . methylPREDNISolone (SOLU-MEDROL) injection  60 mg Intravenous Q6H  . propafenone  150 mg Oral Q8H  . sodium chloride flush  3 mL Intravenous Q12H  . Warfarin - Pharmacist Dosing Inpatient   Does not apply q1800      Time spent: 25 min  [facility] Pager [phone]. If 7PM-7AM, please contact night-coverage at [email or web address], Office  [phone]  password [email or web address] 11/09/2016, 1:37 PM  LOS: 1 day

## 2016-11-10 DIAGNOSIS — I5023 Acute on chronic systolic (congestive) heart failure: Secondary | ICD-10-CM

## 2016-11-10 LAB — BASIC METABOLIC PANEL
ANION GAP: 11 (ref 5–15)
BUN: 47 mg/dL — ABNORMAL HIGH (ref 6–20)
CALCIUM: 8.5 mg/dL — AB (ref 8.9–10.3)
CO2: 31 mmol/L (ref 22–32)
Chloride: 94 mmol/L — ABNORMAL LOW (ref 101–111)
Creatinine, Ser: 2.14 mg/dL — ABNORMAL HIGH (ref 0.44–1.00)
GFR, EST AFRICAN AMERICAN: 28 mL/min — AB (ref >60)
GFR, EST NON AFRICAN AMERICAN: 24 mL/min — AB (ref >60)
GLUCOSE: 144 mg/dL — AB (ref 65–99)
Potassium: 3.4 mmol/L — ABNORMAL LOW (ref 3.5–5.1)
SODIUM: 136 mmol/L (ref 135–145)

## 2016-11-10 LAB — PROTIME-INR
INR: 6.13
PROTHROMBIN TIME: 56.3 s — AB (ref 11.4–15.2)

## 2016-11-10 LAB — CBC
HCT: 25.7 % — ABNORMAL LOW (ref 36.0–46.0)
Hemoglobin: 7.4 g/dL — ABNORMAL LOW (ref 12.0–15.0)
MCH: 24.5 pg — ABNORMAL LOW (ref 26.0–34.0)
MCHC: 28.8 g/dL — ABNORMAL LOW (ref 30.0–36.0)
MCV: 85.1 fL (ref 78.0–100.0)
PLATELETS: 178 10*3/uL (ref 150–400)
RBC: 3.02 MIL/uL — ABNORMAL LOW (ref 3.87–5.11)
RDW: 26.6 % — AB (ref 11.5–15.5)
WBC: 5.1 10*3/uL (ref 4.0–10.5)

## 2016-11-10 MED ORDER — FUROSEMIDE 10 MG/ML IJ SOLN
40.0000 mg | Freq: Two times a day (BID) | INTRAMUSCULAR | Status: DC
  Administered 2016-11-10 – 2016-11-11 (×2): 40 mg via INTRAVENOUS
  Filled 2016-11-10 (×2): qty 4

## 2016-11-10 MED ORDER — METHYLPREDNISOLONE SODIUM SUCC 40 MG IJ SOLR
40.0000 mg | Freq: Two times a day (BID) | INTRAMUSCULAR | Status: DC
  Administered 2016-11-10: 40 mg via INTRAVENOUS
  Filled 2016-11-10: qty 1

## 2016-11-10 MED ORDER — IPRATROPIUM-ALBUTEROL 0.5-2.5 (3) MG/3ML IN SOLN: 3.0000 mL | RESPIRATORY_TRACT | Status: DC | PRN

## 2016-11-10 MED ORDER — HYPROMELLOSE (GONIOSCOPIC) 2.5 % OP SOLN: 1.0000 [drp] | OPHTHALMIC | Status: DC | PRN

## 2016-11-10 MED ORDER — DOXYCYCLINE HYCLATE 100 MG PO TABS
100.0000 mg | ORAL_TABLET | Freq: Two times a day (BID) | ORAL | Status: DC
  Administered 2016-11-10 – 2016-11-13 (×7): 100 mg via ORAL
  Filled 2016-11-10 (×7): qty 1

## 2016-11-10 MED ORDER — POTASSIUM CHLORIDE CRYS ER 20 MEQ PO TBCR
40.0000 meq | EXTENDED_RELEASE_TABLET | Freq: Once | ORAL | Status: AC
  Administered 2016-11-10: 40 meq via ORAL
  Filled 2016-11-10: qty 2

## 2016-11-10 MED ORDER — POLYVINYL ALCOHOL 1.4 % OP SOLN
1.0000 [drp] | OPHTHALMIC | Status: DC | PRN
  Administered 2016-11-10: 1 [drp] via OPHTHALMIC
  Filled 2016-11-10: qty 15

## 2016-11-10 MED ORDER — HYPROMELLOSE (GONIOSCOPIC) 2.5 % OP SOLN
1.0000 [drp] | OPHTHALMIC | Status: DC | PRN
  Filled 2016-11-10: qty 15

## 2016-11-10 NOTE — Clinical Social Work Note (Signed)
Clinical Social Work Assessment  Patient Details  Name: Paige Marshall MRN: [id number] Date of Birth: 1956-08-18  Date of referral:  11/10/16               Reason for consult:  Discharge Planning                Permission sought to share information with:  Facility Sport and exercise psychologist, Family Supports Permission granted to share information::  Yes, Verbal Permission Granted  Name::     Laniqua Torrens  Agency::  [employer]  Relationship::  Son  Contact Information:  [phone]  Housing/Transportation Living arrangements for the past 2 months:  [place] of Information:  Patient, Facility, Medical Team Patient Interpreter Needed:  None Criminal Activity/Legal Involvement Pertinent to Current Situation/Hospitalization:  No - Comment as needed Significant Relationships:  Adult Children, Siblings, Significant Other Lives with:  Facility Resident Do you feel safe going back to the place where you live?  Yes Need for family participation in patient care:  Yes (Comment)  Care giving concerns:  Patient is a [facility] at [facility].   Social Worker assessment / plan:  CSW met with patient. No supports at bedside. CSW introduced role and explained that discharge planning would be discussed. Patient confirmed that she is from [employer] and plans to return once medically stable for discharge. Patient states that she is discharging today but, per MD, she is not ready for that yet. No further concerns. CSW encouraged patient to contact CSW as needed. CSW will continue to follow patient for support and facilitate discharge back to SNF once medically stable.  Employment status:  Disabled (Comment on whether or not currently receiving Disability) Insurance information:  Medicare PT Recommendations:  Not assessed at this time Information / Referral to community resources:  [place]  Patient/Family's Response to  care:  Patient agreeable to return to SNF. Patient's family and friends supportive and involved in patient's care. Patient appreciated social work intervention.  Patient/Family's Understanding of and Emotional Response to Diagnosis, Current Treatment, and Prognosis:  Patient appears to have a good understanding of the reason for admission. Patient appears happy with hospital care.  Emotional Assessment Appearance:  Appears stated age Attitude/Demeanor/Rapport:  Other (Pleasant) Affect (typically observed):  Accepting, Appropriate, Calm, Pleasant Orientation:  Oriented to Self, Oriented to Place, Oriented to  Time, Oriented to Situation Alcohol / Substance use:  Never Used Psych involvement (Current and /or in the community):  No (Comment)  Discharge Needs  Concerns to be addressed:  Care Coordination Readmission within the last 30 days:  No Current discharge risk:  Dependent with Mobility Barriers to Discharge:  Continued Medical Work up   Candie Chroman, LCSW 11/10/2016, 10:34 AM

## 2016-11-10 NOTE — Progress Notes (Signed)
[place] for warfarin  Indication: atrial fibrillation  Allergies  Allergen Reactions  . Penicillins Hives, Itching, Swelling and Other (See Comments)    Tongue swelling Has patient had a PCN reaction causing immediate rash, facial/tongue/throat swelling, SOB or lightheadedness with hypotension: Yes  Clarified with the patient her penicillin allergy. Pt has tolerated cefepime in the past. Pt also states that she can take amoxicillin.    . Citrus Hives    Patient Measurements: Height: 5\' 7"  (170.2 cm) Weight: (!) 379 lb 6.4 oz (172.1 kg) IBW/kg (Calculated) : 61.6  Vital Signs: Temp: 97.9 F (36.6 C) (01/31 0635) Temp Source: Oral (01/31 0635) BP: 125/78 (01/31 0635) Pulse Rate: 91 (01/31 0635)  Labs:  Recent Labs  11/07/16 1956 11/08/16 0025 11/08/16 0714 11/09/16 0533 11/10/16 0505  HGB  --   --  7.8* 7.5* 7.4*  HCT  --   --  27.1* 25.6* 25.7*  PLT  --   --  151 173 178  LABPROT  --   --  30.6* 46.0* 56.3*  INR  --   --  2.85 4.76* 6.13*  CREATININE  --   --  1.60* 1.88* 2.14*  TROPONINI 0.04* 0.03* 0.03*  --   --     Estimated Creatinine Clearance: 47.9 mL/min (by C-G formula based on SCr of 2.14 mg/dL (H)).   Assessment: 61 yo female with hx of atrial fibrillation on warfarin PTA admitted with COPD exacerbation. Pharmacy consulted to manage warfarin inpatient. INR continues to increase to 6.13. Pt did not receive a dose yesterday. CBC is stable and no bleeding noted. Of note, pt is also on levaquin which can increase the INR.    Prior to arrival dose of warfarin is 10 mg on Sunday and Friday and 7.5 mg all other days   Goal of Therapy:  INR 2-3 Monitor platelets by anticoagulation protocol: Yes   Plan:  No warfarin today Daily INR  Salome Arnt, PharmD, BCPS Pager # [phone] 11/10/2016 8:25 AM

## 2016-11-10 NOTE — Progress Notes (Addendum)
Pt slept on and off overnight, Ambien given to sleep per MD order and nasal spray provided for complain of nose dry, otherwise no any other complain of chest pain and shortness of breath noted, pt is incontinent throughout the night, unable to record output as patient void on chucks, will continue to monitor the patient. Palma Holter, RN

## 2016-11-10 NOTE — Progress Notes (Signed)
Dr. Mart Piggs sent text page with the following abnormal morning labs: K+ = 3.4 H/H = 7.4/25.7 BUN/Crea = 47/2.14 PT/INR = 56/6.13

## 2016-11-10 NOTE — NC FL2 (Signed)
[place] LEVEL OF CARE SCREENING TOOL     IDENTIFICATION  Patient Name: MERILYNN METTERT Birthdate: [birth year] Sex: female Admission Date (Current Location): 11/07/2016  [facility] and [place] Number:  Herbalist and Address:  [facility], [place] [address] [place], [place] [address]      Provider Number: [id number]  Attending Physician Name and Address:  Theodis Blaze, MD  Relative Name and Phone Number:       Current Level of Care: Hospital Recommended Level of Care: [place] Prior Approval Number:    Date Approved/Denied:   PASRR Number: [id number]  Discharge Plan: SNF    Current Diagnoses: Patient Active Problem List   Diagnosis Date Noted  . Acute on chronic respiratory failure ([place]) 11/07/2016  . Acute exacerbation of CHF (congestive heart failure) ([place]) 11/07/2016  . Acute renal failure superimposed on chronic kidney disease ([place])   . Acute respiratory failure with hypoxemia ([place]) 09/18/2016  . Orthostatic hypotension   . Bradycardia   . Acute on chronic combined systolic and diastolic CHF (congestive heart failure) ([place])   . Anemia 09/06/2016  . Left fibular fracture 09/06/2016  . SOB (shortness of breath)   . Acute kidney injury superimposed on CKD ([place]) 08/31/2016  . NICM (nonischemic cardiomyopathy) ([place]) 08/31/2016  . Pulmonary hypertension 08/31/2016  . Right heart failure, NYHA class 3 08/31/2016  . Acute on chronic diastolic CHF (congestive heart failure) ([place]) 08/29/2016  . 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 Syncope   . AKI (acute kidney injury) ([place]) 03/08/2016  . Acute on chronic renal failure ([place]) 03/08/2016  . Chronic respiratory failure with hypoxia ([place]) 10/24/2015  . COPD mixed type ([place]) 10/10/2015  . 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 Uncontrolled hypertension   .  Obesity hypoventilation syndrome ([place]) 01/14/2015  . Obstructive sleep apnea 09/28/2013  . Morbid obesity ([place]) 09/28/2013  . PAF (paroxysmal atrial fibrillation) ([place]) 01/09/2010  . Dyslipidemia 10/17/2006  . Essential hypertension 10/17/2006  . VENTRICULAR HYPERTROPHY, LEFT 10/17/2006  . DYSFUNCTIONAL UTERINE BLEEDING 10/17/2006    Orientation RESPIRATION BLADDER Height & Weight     Self, Time, Situation, Place  O2 (Nasal canula 2 L. Refusing bipap.) Incontinent Weight: (!) 379 lb 6.4 oz (172.1 kg) Height:  5\' 7"  (170.2 cm)  BEHAVIORAL SYMPTOMS/MOOD NEUROLOGICAL BOWEL NUTRITION STATUS   (None)  (None) Continent Diet (Heart healthy)  AMBULATORY STATUS COMMUNICATION OF NEEDS Skin   Total Care (Wheelchair bound) Verbally Normal                       Personal Care Assistance Level of Assistance  Bathing, Feeding, Dressing Bathing Assistance: Maximum assistance Feeding assistance: Limited assistance Dressing Assistance: Maximum assistance     Functional Limitations Info  Sight, Hearing, Speech Sight Info: Adequate Hearing Info: Adequate Speech Info: Adequate    SPECIAL CARE FACTORS FREQUENCY  Blood pressure                    Contractures Contractures Info: Not present    Additional Factors Info  Code Status, Allergies Code Status Info: Full Allergies Info: Penicillins, Citrus           Current Medications (11/10/2016):  This is the current hospital active medication list Current Facility-Administered Medications  Medication Dose Route Frequency Provider Last Rate Last Dose  .  0.9 %  sodium chloride infusion  250 mL Intravenous PRN Oswald Hillock, MD      . acetaminophen (TYLENOL) tablet 650 mg  650 mg Oral Q4H PRN Oswald Hillock, MD      . bisacodyl (DULCOLAX) suppository 10 mg  10 mg Rectal Daily PRN Oswald Hillock, MD      . carvedilol (COREG) tablet 6.25 mg  6.25 mg Oral BID WC Oswald Hillock, MD   6.25 mg at 11/10/16 0618  . diltiazem (CARDIZEM CD) 24 hr  capsule 180 mg  180 mg Oral Daily Oswald Hillock, MD   180 mg at 11/10/16 0900  . docusate sodium (COLACE) capsule 100 mg  100 mg Oral BID Oswald Hillock, MD   100 mg at 11/10/16 1000  . ferrous sulfate tablet 325 mg  325 mg Oral TID WC Oswald Hillock, MD   325 mg at 11/10/16 0858  . furosemide (LASIX) injection 60 mg  60 mg Intravenous Q12H Oswald Hillock, MD   60 mg at 11/10/16 0617  . gabapentin (NEURONTIN) capsule 300 mg  300 mg Oral QHS Oswald Hillock, MD      . hydrALAZINE (APRESOLINE) tablet 25 mg  25 mg Oral Q6H PRN Oswald Hillock, MD      . ipratropium-albuterol (DUONEB) 0.5-2.5 (3) MG/3ML nebulizer solution 3 mL  3 mL Nebulization Q6H Oswald Hillock, MD   3 mL at 11/10/16 0805  . ipratropium-albuterol (DUONEB) 0.5-2.5 (3) MG/3ML nebulizer solution 3 mL  3 mL Nebulization Q4H PRN Theodis Blaze, MD      . levofloxacin [facility]) tablet 500 mg  500 mg Oral Q24H Oswald Hillock, MD   500 mg at 11/09/16 1739  . methylPREDNISolone sodium succinate (SOLU-MEDROL) 40 mg/mL injection 40 mg  40 mg Intravenous Q6H Oswald Hillock, MD   40 mg at 11/10/16 0901  . ondansetron (ZOFRAN) injection 4 mg  4 mg Intravenous Q6H PRN Oswald Hillock, MD      . oxyCODONE (Oxy IR/ROXICODONE) immediate release tablet 5 mg  5 mg Oral Q6H PRN Oswald Hillock, MD   5 mg at 11/08/16 0218  . polyethylene glycol (MIRALAX / GLYCOLAX) packet 17 g  17 g Oral Daily PRN Oswald Hillock, MD      . propafenone (RYTHMOL) tablet 150 mg  150 mg Oral Q8H Oswald Hillock, MD   150 mg at 11/10/16 0617  . sodium chloride (OCEAN) 0.65 % nasal spray 1 spray  1 spray Each Nare PRN Jeryl Columbia, NP   1 spray at 11/09/16 2315  . sodium chloride flush (NS) 0.9 % injection 3 mL  3 mL Intravenous Q12H Oswald Hillock, MD   3 mL at 11/10/16 1000  . sodium chloride flush (NS) 0.9 % injection 3 mL  3 mL Intravenous PRN Oswald Hillock, MD      . Warfarin - Pharmacist Dosing Inpatient   Does not apply q1800 Oswald Hillock, MD   Stopped at 11/09/16 1800  . zolpidem (AMBIEN) tablet  5 mg  5 mg Oral QHS PRN Jeryl Columbia, NP   5 mg at 11/09/16 2300     Discharge Medications: Please see discharge summary for a list of discharge medications.  Relevant Imaging Results:  Relevant Lab Results:   Additional Information SS#: [id number]  Candie Chroman, LCSW

## 2016-11-10 NOTE — Progress Notes (Signed)
Patient refusing BIPAP.

## 2016-11-10 NOTE — Progress Notes (Signed)
[facility]  PROGRESS NOTE  Paige Marshall [id number] DOB: [birth year] DOA: 11/07/2016   PCP: Kerin Perna, NP  Brief HPI:   61 y.o. female, With history of COPD on 3 L oxygen at home, atrial fibrillation on anticoagulation with Coumadin, right heart failure, pulmonary hypertension who came to hospital with worsening bilateral leg swelling and worsening shortness of breath. Patient is currently residing at rehabilitation facility since November 2017, she is wheelchair bound. In the ED, chest x-ray showed mild pulmonary edema. She is on high-dose Demadex at home.  Subjective   This morning patient feels better, diuresed well with IV Lasix. Says the leg edema has improved.   Assessment/Plan:   Acute right heart failure - patient has history of right heart failure, per most recent ECHO 09/2016 EF 50-55% - here started on Lasix 60 mg IV BID and clinically improving but Cr trending up - will lower the dose of Lasix to 40 mg IV BID, BMP in AM - not sure how accurate weight is but has been ~ 373 lbs - not sure we can get accurate weights using bed scale especially given pt's body habitus, morbid obesity  COPD exacerbation - less wheezing this AM - will lower the frequency of solumedrol to 40 mg IV BID and continue to taper down - change Levaquin to doxycycline as INR is up  - continue bronchodilators scheduled and as needed   Paroxysmal atrial fibrillation - CHA2DS2VASc score is 3, patient is currently on Coumadin for anticoagulation - INR is supra therapeutic, pharmacy to monitor with [place], appreciate assistance - continue Cardizem   Acute on Chronic kidney disease stage III - patient's creatinine 1.59 on admission - trending up and suspect from lasix - lower the dose of Lasix and repeat BMP in AM  Anemia of chronic disease - secondary to chronic kidney disease stage III - no signs of active bleeding - CBC In AM  Obesity hypoventilation syndrome, morbid  obesity - Body mass index is 59.42 kg/m. - continue BiPAP at bedtime  History of left fibular fracture - patient sustained left fibular fracture in December, patient is currently at rehabilitation facility  Hypertension, essential - reasonable inpatient control   DVT prophylaxis: Patient on warfarin  Code Status: Full code  Family Communication: No family present at bedside  Disposition Plan: Back to skilled facility once she improves.  Consultants: None  Procedures: None   Antibiotics:   Anti-infectives    Start     Dose/Rate Route Frequency Ordered Stop   11/10/16 1100  doxycycline (VIBRA-TABS) tablet 100 mg     100 mg Oral Every 12 hours 11/10/16 1046     11/08/16 1800  levofloxacin (LEVAQUIN) tablet 500 mg  Status:  Discontinued     500 mg Oral Every 24 hours 11/07/16 2000 11/10/16 1046   11/08/16 1000  levofloxacin (LEVAQUIN) tablet 500 mg  Status:  Discontinued     500 mg Oral Daily 11/07/16 1852 11/07/16 2000   11/07/16 1730  levofloxacin (LEVAQUIN) tablet 750 mg     750 mg Oral  Once 11/07/16 1727 11/07/16 1734      Objective   Vitals:   11/09/16 2037 11/09/16 2231 11/10/16 0635 11/10/16 0934  BP:  117/79 125/78 131/71  Pulse:  83 91 (!) 106  Resp:  20 18 20   Temp:  98.2 F (36.8 C) 97.9 F (36.6 C) 97.7 F (36.5 C)  TempSrc:  Oral Oral Oral  SpO2: 98% 98% 98% 100%  Weight:   [name])  172.1 kg (379 lb 6.4 oz)   Height:        Intake/Output Summary (Last 24 hours) at 11/10/16 1602 Last data filed at 11/10/16 1410  Gross per 24 hour  Intake              660 ml  Output                0 ml  Net              660 ml   Filed Weights   11/08/16 0514 11/09/16 0633 11/10/16 0635  Weight: (!) 171.9 kg (379 lb) (!) 170 kg (374 lb 12.8 oz) (!) 172.1 kg (379 lb 6.4 oz)     Physical Examination:  General exam: Appears calm and comfortable. Respiratory system: Clear bilaterally. Respiratory effort normal. Diminished breath sounds at bases Cardiovascular  system:  RRR. No  murmurs, rubs, gallops. Bilateral 1+ pitting edema  GI system: Abdomen is nondistended, soft and nontender. No organomegaly.  Central nervous system. No focal neurological deficits. 5 x 5 power in all extremities. Psychiatry: Alert, oriented x 3.Judgement and insight appear normal. Affect normal.  Data Reviewed: I have personally reviewed following labs and imaging studies  CBG: No results for input(s): GLUCAP in the last 168 hours.  CBC:  Recent Labs Lab 11/07/16 1633 11/08/16 0714 11/09/16 0533 11/10/16 0505  WBC 9.5 5.3 6.1 5.1  NEUTROABS 7.2  --   --   --   HGB 8.2* 7.8* 7.5* 7.4*  HCT 28.6* 27.1* 25.6* 25.7*  MCV 86.4 85.8 85.9 85.1  PLT 179 151 173 0000000    Basic Metabolic Panel:  Recent Labs Lab 11/07/16 1633 11/08/16 0714 11/09/16 0533 11/10/16 0505  NA 138 141 139 136  K 3.5 3.5 3.5 3.4*  CL 96* 95* 95* 94*  CO2 31 35* 33* 31  GLUCOSE 111* 152* 131* 144*  BUN 35* 34* 40* 47*  CREATININE 1.59* 1.60* 1.88* 2.14*  CALCIUM 8.4* 8.5* 8.5* 8.5*    Recent Results (from the past 240 hour(s))  MRSA PCR Screening     Status: None   Collection Time: 11/07/16  8:27 PM  Result Value Ref Range Status   MRSA by PCR NEGATIVE NEGATIVE Final    Comment:        The GeneXpert MRSA Assay (FDA approved for NASAL specimens only), is one component of a comprehensive MRSA colonization surveillance program. It is not intended to diagnose MRSA infection nor to guide or monitor treatment for MRSA infections.       Cardiac Enzymes:  Recent Labs Lab 11/07/16 1956 11/08/16 0025 11/08/16 0714  TROPONINI 0.04* 0.03* 0.03*   BNP (last 3 results)  Recent Labs  09/05/16 1555 09/18/16 1126 11/07/16 1633  BNP 665.3* 991.0* 490.4*   Studies: No results found.  Scheduled Meds: . carvedilol  6.25 mg Oral BID WC  . diltiazem  180 mg Oral Daily  . docusate sodium  100 mg Oral BID  . doxycycline  100 mg Oral Q12H  . ferrous sulfate  325 mg Oral  TID WC  . furosemide  40 mg Intravenous Q12H  . gabapentin  300 mg Oral QHS  . ipratropium-albuterol  3 mL Nebulization Q6H  . methylPREDNISolone (SOLU-MEDROL) injection  40 mg Intravenous Q12H  . propafenone  150 mg Oral Q8H  . sodium chloride flush  3 mL Intravenous Q12H  . Warfarin - Pharmacist Dosing Inpatient   Does not apply q1800    Time spent:  25 min  Faye Ramsay, MD   [facility] Pager [phone]. If 7PM-7AM, please contact night-coverage at [email or web address], Office  [phone]  password [email or web address] 11/10/2016, 4:02 PM  LOS: 2 days

## 2016-11-11 ENCOUNTER — Other Ambulatory Visit: Payer: Self-pay | Admitting: Internal Medicine

## 2016-11-11 ENCOUNTER — Telehealth: Payer: Self-pay | Admitting: Cardiovascular Disease

## 2016-11-11 LAB — CBC
HEMATOCRIT: 30.9 % — AB (ref 36.0–46.0)
Hemoglobin: 8.7 g/dL — ABNORMAL LOW (ref 12.0–15.0)
MCH: 24.1 pg — ABNORMAL LOW (ref 26.0–34.0)
MCHC: 28.2 g/dL — ABNORMAL LOW (ref 30.0–36.0)
MCV: 85.6 fL (ref 78.0–100.0)
PLATELETS: 203 10*3/uL (ref 150–400)
RBC: 3.61 MIL/uL — ABNORMAL LOW (ref 3.87–5.11)
RDW: 25.7 % — AB (ref 11.5–15.5)
WBC: 5.6 10*3/uL (ref 4.0–10.5)

## 2016-11-11 LAB — BASIC METABOLIC PANEL
ANION GAP: 12 (ref 5–15)
BUN: 53 mg/dL — ABNORMAL HIGH (ref 6–20)
CALCIUM: 8.7 mg/dL — AB (ref 8.9–10.3)
CHLORIDE: 98 mmol/L — AB (ref 101–111)
CO2: 29 mmol/L (ref 22–32)
Creatinine, Ser: 2.17 mg/dL — ABNORMAL HIGH (ref 0.44–1.00)
GFR calc non Af Amer: 24 mL/min — ABNORMAL LOW (ref >60)
GFR, EST AFRICAN AMERICAN: 28 mL/min — AB (ref >60)
Glucose, Bld: 166 mg/dL — ABNORMAL HIGH (ref 65–99)
Potassium: 4.1 mmol/L (ref 3.5–5.1)
Sodium: 139 mmol/L (ref 135–145)

## 2016-11-11 LAB — PROTIME-INR
INR: 5.95 — AB
PROTHROMBIN TIME: 55 s — AB (ref 11.4–15.2)

## 2016-11-11 MED ORDER — CARVEDILOL 6.25 MG PO TABS
6.2500 mg | ORAL_TABLET | Freq: Two times a day (BID) | ORAL | Status: DC
  Administered 2016-11-11 – 2016-11-13 (×5): 6.25 mg via ORAL
  Filled 2016-11-11 (×5): qty 1

## 2016-11-11 MED ORDER — IPRATROPIUM-ALBUTEROL 0.5-2.5 (3) MG/3ML IN SOLN
3.0000 mL | Freq: Three times a day (TID) | RESPIRATORY_TRACT | Status: DC
  Administered 2016-11-11 – 2016-11-13 (×7): 3 mL via RESPIRATORY_TRACT
  Filled 2016-11-11 (×7): qty 3

## 2016-11-11 MED ORDER — FERROUS SULFATE 325 (65 FE) MG PO TABS
325.0000 mg | ORAL_TABLET | Freq: Three times a day (TID) | ORAL | Status: DC
  Administered 2016-11-11 – 2016-11-13 (×7): 325 mg via ORAL
  Filled 2016-11-11 (×7): qty 1

## 2016-11-11 MED ORDER — METHYLPREDNISOLONE SODIUM SUCC 40 MG IJ SOLR
40.0000 mg | Freq: Every day | INTRAMUSCULAR | Status: DC
  Administered 2016-11-12: 40 mg via INTRAVENOUS
  Filled 2016-11-11 (×2): qty 1

## 2016-11-11 MED ORDER — FUROSEMIDE 10 MG/ML IJ SOLN
40.0000 mg | Freq: Every day | INTRAMUSCULAR | Status: DC
  Administered 2016-11-12 – 2016-11-13 (×2): 40 mg via INTRAVENOUS
  Filled 2016-11-11 (×3): qty 4

## 2016-11-11 NOTE — Evaluation (Signed)
Physical Therapy Evaluation Patient Details Name: Paige Marshall MRN: [id number] DOB: 06/12/1956 Today's Date: 11/11/2016   History of Present Illness  Paige Marshall  is a 61 y.o. female, With history of COPD on 3 L oxygen at home, atrial fibrillation on anticoagulation with Coumadin, right heart failure, pulmonary hypertension who came to hospital with worsening bilateral leg swelling with worsening shortness of breath. Patient is currently residing at rehabilitation facility since November 2017, she is wheelchair bound.   Clinical Impression  Pt admitted with above diagnosis. Pt currently with functional limitations due to the deficits listed below (see PT Problem List). Pt will benefit from skilled PT to increase their independence and safety with mobility to allow discharge to the venue listed below.  If pt to be OOB, will need to use lift.  Pt has stood 3x recently at SNF with 3+ person A while pulling on // bars.  Will focus on bed mobility, strengthening, and scooting during acute stay.  Spoke to pt and NT about having pt sit EOB for meals and pt liked this idea. Recommend returning to SNF.     Follow Up Recommendations SNF    Equipment Recommendations  None recommended by PT    Recommendations for Other Services       Precautions / Restrictions Precautions Precautions: Fall Restrictions Weight Bearing Restrictions: Yes LLE Weight Bearing:  (Pt reports she is allowed to WB thru L LE now) Other Position/Activity Restrictions: no specific orders in chart, but pt stated she was allowed at SNF to be WBAT      Mobility  Bed Mobility Overal bed mobility: Needs Assistance Bed Mobility: Supine to Sit     Supine to sit: Min assist     General bed mobility comments: A for R LE to come to EOB, heavy use of rail.  Worked on scooting at [employer].  Transfers                    Ambulation/Gait                Stairs            Wheelchair Mobility     Modified Rankin (Stroke Patients Only)       Balance Overall balance assessment: Needs assistance   Sitting balance-Leahy Scale: Good       Standing balance-Leahy Scale: Zero                               Pertinent Vitals/Pain Pain Assessment: No/denies pain    Home Living Family/patient expects to be discharged to:: Skilled nursing facility                      Prior Function Level of Independence: Needs assistance   Gait / Transfers Assistance Needed: hoyer lift from bed <> w/c. Has stood 3x with 3 person A while pulling up on // bars           Hand Dominance        Extremity/Trunk Assessment   Upper Extremity Assessment Upper Extremity Assessment: Generalized weakness    Lower Extremity Assessment Lower Extremity Assessment: Generalized weakness       Communication   Communication: No difficulties  Cognition Arousal/Alertness: Awake/alert Behavior During Therapy: WFL for tasks assessed/performed Overall Cognitive Status: Within Functional Limits for tasks assessed  General Comments General comments (skin integrity, edema, etc.): Verbally reviewed seated therex that pt has been doing at [facility].  Encourged her to continue with these while in acute care.    Exercises     Assessment/Plan    PT Assessment Patient needs continued PT services  PT Problem List Decreased strength;Decreased activity tolerance;Decreased mobility          PT Treatment Interventions Functional mobility training;Therapeutic activities;Therapeutic exercise    PT Goals (Current goals can be found in the Care Plan section)  Acute Rehab PT Goals Patient Stated Goal: go back to SNF PT Goal Formulation: With patient Time For Goal Achievement: 11/25/16 Potential to Achieve Goals: Good    Frequency Min 2X/week   Barriers to discharge        Co-evaluation               End of Session Equipment Utilized During  Treatment: Oxygen Activity Tolerance: Patient tolerated treatment well Patient left: in bed (sitting EOB) Nurse Communication: Mobility status;Need for lift equipment         Time: [phone] PT Time Calculation (min) (ACUTE ONLY): 18 min   Charges:   PT Evaluation $PT Eval Low Complexity: 1 Procedure     PT G Codes:        Paige Marshall 11/11/2016, 10:23 AM

## 2016-11-11 NOTE — Progress Notes (Signed)
[facility]  PROGRESS NOTE  Paige Marshall [id number] DOB: 11-22-1955 DOA: 11/07/2016   PCP: Kerin Perna, NP  Brief HPI:   61 y.o. female, With history of COPD on 3 L oxygen at home, atrial fibrillation on anticoagulation with Coumadin, right heart failure, pulmonary hypertension who came to hospital with worsening bilateral leg swelling and worsening shortness of breath. Patient is currently residing at rehabilitation facility since November 2017, she is wheelchair bound. In the ED, chest x-ray showed mild pulmonary edema. She is on high-dose Demadex at home.  Subjective   This morning patient feels better, diuresed well with IV Lasix. Says the leg edema has improved.   Assessment/Plan:   Acute right heart failure - patient has history of right heart failure, per most recent ECHO 09/2016 EF 50-55% - here started on Lasix 60 mg IV BID and clinically improving but was Cr trending up - the dose of Lasix was lowered from 60 to 40 mg IV BID and Cr is still up so will hold second dose of lasix today to see if that will help  - not sure how accurate weight is but has been ~ 373 lbs - not sure we can get accurate weights using bed scale especially given pt's body habitus, morbid obesity  COPD exacerbation - less wheezing this AM - continue to taper down steroids  - changed Levaquin to doxycycline as INR is up  - continue bronchodilators scheduled and as needed   Paroxysmal atrial fibrillation - CHA2DS2VASc score is 3, patient is currently on Coumadin for anticoagulation - INR is still supra therapeutic, pharmacy to monitor with [place], appreciate assistance - continue Cardizem   Acute on Chronic kidney disease stage III - patient's creatinine 1.59 on admission - trending up and suspect from lasix - lowered the dose and frequency of Lasix as noted above - repeat BMP in AM  Hypokalemia - supplement and repeat BMP in AM  Anemia of chronic disease - secondary to  chronic kidney disease stage III - no signs of active bleeding - CBC In AM  Obesity hypoventilation syndrome, morbid obesity - Body mass index is 59.42 kg/m. - continue BiPAP at bedtime  History of left fibular fracture - patient sustained left fibular fracture in December, patient is currently at rehabilitation facility  Hypertension, essential - reasonable inpatient control   DVT prophylaxis: Patient on warfarin  Code Status: Full code  Family Communication: No family present at bedside  Disposition Plan: Back to skilled facility once she improves.and once blood work PT/INR, Cr stable   Consultants: None  Procedures: None   Antibiotics:   Anti-infectives    Start     Dose/Rate Route Frequency Ordered Stop   11/10/16 1100  doxycycline (VIBRA-TABS) tablet 100 mg     100 mg Oral Every 12 hours 11/10/16 1046     11/08/16 1800  levofloxacin (LEVAQUIN) tablet 500 mg  Status:  Discontinued     500 mg Oral Every 24 hours 11/07/16 2000 11/10/16 1046   11/08/16 1000  levofloxacin (LEVAQUIN) tablet 500 mg  Status:  Discontinued     500 mg Oral Daily 11/07/16 1852 11/07/16 2000   11/07/16 1730  levofloxacin (LEVAQUIN) tablet 750 mg     750 mg Oral  Once 11/07/16 1727 11/07/16 1734      Objective   Vitals:   11/10/16 2200 11/11/16 0450 11/11/16 0717 11/11/16 0920  BP: 130/78  140/85   Pulse: 92  82   Resp: 18  Temp: 97.9 F (36.6 C)  98 F (36.7 C)   TempSrc: Oral  Oral   SpO2: 93%  96% 95%  Weight:  (!) 169.4 kg (373 lb 8 oz)    Height:        Intake/Output Summary (Last 24 hours) at 11/11/16 1013 Last data filed at 11/11/16 0856  Gross per 24 hour  Intake              840 ml  Output                0 ml  Net              840 ml   Filed Weights   11/09/16 0633 11/10/16 0635 11/11/16 0450  Weight: (!) 170 kg (374 lb 12.8 oz) (!) 172.1 kg (379 lb 6.4 oz) (!) 169.4 kg (373 lb 8 oz)     Physical Examination:  General exam: Appears calm and  comfortable. Respiratory system: Clear bilaterally. Respiratory effort normal. Diminished breath sounds at bases Cardiovascular system:  RRR. No  murmurs, rubs, gallops. Bilateral 1+ pitting edema and ankle edema  GI system: Abdomen is nondistended, soft and nontender. No organomegaly.  Central nervous system. No focal neurological deficits. 5 x 5 power in all extremities. Psychiatry: Alert, oriented x 3.Judgement and insight appear normal. Affect normal.  Data Reviewed: I have personally reviewed following labs and imaging studies  CBG: No results for input(s): GLUCAP in the last 168 hours.  CBC:  Recent Labs Lab 11/07/16 1633 11/08/16 0714 11/09/16 0533 11/10/16 0505 11/11/16 0446  WBC 9.5 5.3 6.1 5.1 5.6  NEUTROABS 7.2  --   --   --   --   HGB 8.2* 7.8* 7.5* 7.4* 8.7*  HCT 28.6* 27.1* 25.6* 25.7* 30.9*  MCV 86.4 85.8 85.9 85.1 85.6  PLT 179 151 173 178 [id number]    Basic Metabolic Panel:  Recent Labs Lab 11/07/16 1633 11/08/16 0714 11/09/16 0533 11/10/16 0505 11/11/16 0446  NA 138 141 139 136 139  K 3.5 3.5 3.5 3.4* 4.1  CL 96* 95* 95* 94* 98*  CO2 31 35* 33* 31 29  GLUCOSE 111* 152* 131* 144* 166*  BUN 35* 34* 40* 47* 53*  CREATININE 1.59* 1.60* 1.88* 2.14* 2.17*  CALCIUM 8.4* 8.5* 8.5* 8.5* 8.7*    Recent Results (from the past 240 hour(s))  MRSA PCR Screening     Status: None   Collection Time: 11/07/16  8:27 PM  Result Value Ref Range Status   MRSA by PCR NEGATIVE NEGATIVE Final    Comment:        The GeneXpert MRSA Assay (FDA approved for NASAL specimens only), is one component of a comprehensive MRSA colonization surveillance program. It is not intended to diagnose MRSA infection nor to guide or monitor treatment for MRSA infections.       Cardiac Enzymes:  Recent Labs Lab 11/07/16 1956 11/08/16 0025 11/08/16 0714  TROPONINI 0.04* 0.03* 0.03*   BNP (last 3 results)  Recent Labs  09/05/16 1555 09/18/16 1126 11/07/16 1633  BNP 665.3*  991.0* 490.4*   Studies: No results found.  Scheduled Meds: . carvedilol  6.25 mg Oral BID WC  . diltiazem  180 mg Oral Daily  . docusate sodium  100 mg Oral BID  . doxycycline  100 mg Oral Q12H  . ferrous sulfate  325 mg Oral TID WC  . furosemide  40 mg Intravenous Q12H  . gabapentin  300 mg Oral QHS  .  ipratropium-albuterol  3 mL Nebulization TID  . methylPREDNISolone (SOLU-MEDROL) injection  40 mg Intravenous Q12H  . propafenone  150 mg Oral Q8H  . sodium chloride flush  3 mL Intravenous Q12H  . Warfarin - Pharmacist Dosing Inpatient   Does not apply q1800    Time spent: 25 min  Faye Ramsay, MD   [facility] Pager [phone]. If 7PM-7AM, please contact night-coverage at [email or web address], Office  [phone]  password [email or web address] 11/11/2016, 10:13 AM  LOS: 3 days

## 2016-11-11 NOTE — Telephone Encounter (Signed)
New message    Pt verbalized that she is returning call and she also wants to inform rn that she is [facility]

## 2016-11-11 NOTE — Telephone Encounter (Signed)
Tried to call pt-unable to get thru-message "due to network difficulties......." x3

## 2016-11-11 NOTE — Progress Notes (Signed)
Progress for warfarin  Indication: atrial fibrillation  Allergies  Allergen Reactions  . Penicillins Hives, Itching, Swelling and Other (See Comments)    Tongue swelling Has patient had a PCN reaction causing immediate rash, facial/tongue/throat swelling, SOB or lightheadedness with hypotension: Yes  Clarified with the patient her penicillin allergy. Pt has tolerated cefepime in the past. Pt also states that she can take amoxicillin.    . Citrus Hives    Patient Measurements: Height: 5\' 7"  (170.2 cm) Weight: (!) 373 lb 8 oz (169.4 kg) (bed scale) IBW/kg (Calculated) : 61.6  Vital Signs: Temp: 98 F (36.7 C) (02/01 0717) Temp Source: Oral (02/01 0717) BP: 140/85 (02/01 0717) Pulse Rate: 82 (02/01 0717)  Labs:  Recent Labs  11/09/16 0533 11/10/16 0505 11/11/16 0446  HGB 7.5* 7.4* 8.7*  HCT 25.6* 25.7* 30.9*  PLT 173 178 203  LABPROT 46.0* 56.3* 55.0*  INR 4.76* 6.13* 5.95*  CREATININE 1.88* 2.14* 2.17*    Estimated Creatinine Clearance: 46.7 mL/min (by C-G formula based on SCr of 2.17 mg/dL (H)).  Assessment: 61 yo female with hx of atrial fibrillation on warfarin PTA admitted with COPD exacerbation. Pharmacy consulted to manage warfarin inpatient. INR down slightly today to 5.95. CBC is slightly improved today and no bleeding noted. Of note, pt was on levaquin which can increase the INR. This was stopped yesterday and changed to doxycycline which can also increase the INR.     Prior to arrival dose of warfarin is 10 mg on Sunday and Friday and 7.5 mg all other days   Goal of Therapy:  INR 2-3 Monitor platelets by anticoagulation protocol: Yes   Plan:  No warfarin today Daily INR  Salome Arnt, PharmD, BCPS Pager # [phone] 11/11/2016 8:32 AM

## 2016-11-11 NOTE — Progress Notes (Signed)
Pt. Refused CPAP for this evening.

## 2016-11-12 LAB — CBC
HCT: 31 % — ABNORMAL LOW (ref 36.0–46.0)
Hemoglobin: 8.8 g/dL — ABNORMAL LOW (ref 12.0–15.0)
MCH: 24 pg — ABNORMAL LOW (ref 26.0–34.0)
MCHC: 28.4 g/dL — AB (ref 30.0–36.0)
MCV: 84.7 fL (ref 78.0–100.0)
PLATELETS: 200 10*3/uL (ref 150–400)
RBC: 3.66 MIL/uL — ABNORMAL LOW (ref 3.87–5.11)
RDW: 27.2 % — AB (ref 11.5–15.5)
WBC: 6.3 10*3/uL (ref 4.0–10.5)

## 2016-11-12 LAB — BASIC METABOLIC PANEL
Anion gap: 13 (ref 5–15)
BUN: 56 mg/dL — ABNORMAL HIGH (ref 6–20)
CALCIUM: 8.8 mg/dL — AB (ref 8.9–10.3)
CHLORIDE: 99 mmol/L — AB (ref 101–111)
CO2: 30 mmol/L (ref 22–32)
CREATININE: 1.85 mg/dL — AB (ref 0.44–1.00)
GFR calc non Af Amer: 29 mL/min — ABNORMAL LOW (ref >60)
GFR, EST AFRICAN AMERICAN: 34 mL/min — AB (ref >60)
Glucose, Bld: 124 mg/dL — ABNORMAL HIGH (ref 65–99)
Potassium: 3.8 mmol/L (ref 3.5–5.1)
SODIUM: 142 mmol/L (ref 135–145)

## 2016-11-12 LAB — PROTIME-INR
INR: 4.7 — AB
PROTHROMBIN TIME: 44.3 s — AB (ref 11.4–15.2)

## 2016-11-12 MED ORDER — PREDNISONE 50 MG PO TABS
50.0000 mg | ORAL_TABLET | Freq: Every day | ORAL | Status: DC
  Administered 2016-11-13: 50 mg via ORAL
  Filled 2016-11-12: qty 1

## 2016-11-12 NOTE — Progress Notes (Signed)
Pt refused to take dulcolax supp as ordered prn

## 2016-11-12 NOTE — Progress Notes (Addendum)
Paged MD regarding pt critical PT/INR awaiting call back  Paige Voong    MD returned page, aware of lab values, no new order at this time 1058 11/12/2016

## 2016-11-12 NOTE — Progress Notes (Signed)
ANTICOAGULATION CONSULT NOTE - Follow Up Consult  Pharmacy Consult for Coumadin Indication: atrial fibrillation  Allergies  Allergen Reactions  . Penicillins Hives, Itching, Swelling and Other (See Comments)    Tongue swelling Has patient had a PCN reaction causing immediate rash, facial/tongue/throat swelling, SOB or lightheadedness with hypotension: Yes  Clarified with the patient her penicillin allergy. Pt has tolerated cefepime in the past. Pt also states that she can take amoxicillin.    . Citrus Hives    Patient Measurements: Height: 5\' 7"  (170.2 cm) Weight: (!) 371 lb 9.6 oz (168.6 kg) (bed) IBW/kg (Calculated) : 61.6  Vital Signs: Temp: 97.7 F (36.5 C) (02/02 0534) Temp Source: Oral (02/02 0534) BP: 139/94 (02/02 0534) Pulse Rate: 64 (02/02 0534)  Labs:  Recent Labs  11/10/16 0505 11/11/16 0446 11/12/16 0553  HGB 7.4* 8.7* 8.8*  HCT 25.7* 30.9* 31.0*  PLT 178 203 200  LABPROT 56.3* 55.0* 44.3*  INR 6.13* 5.95* 4.70*  CREATININE 2.14* 2.17* 1.85*    Estimated Creatinine Clearance: 54.6 mL/min (by C-G formula based on SCr of 1.85 mg/dL (H)).  Assessment: 60yof on coumadin for afib. INR 2.62 on admit and coumadin resumed x 2 doses. INR up to 4.76 on 1/30 and doses held. Could be due to drug interaction with levaquin which was d/c'ed 1/31 and changed to doxycyline. INR remains elevated but trending down to 4.7. CBC stable. No bleeding.  Home dose: 10 mg on Sunday/Friday and 7.5 mg AOD's  Goal of Therapy:  INR 2-3 Monitor platelets by anticoagulation protocol: Yes   Plan:  1) No coumadin tonight 2) Daily INR  Deboraha Sprang 11/12/2016,10:06 AM

## 2016-11-12 NOTE — Progress Notes (Signed)
[facility]  PROGRESS NOTE  [name] [id number] DOB: [birth year] DOA: 11/07/2016   PCP: Kerin Perna, NP  Brief HPI:   61 y.o. female, With history of COPD on 3 L oxygen at home, atrial fibrillation on anticoagulation with Coumadin, right heart failure, pulmonary hypertension who came to hospital with worsening bilateral leg swelling and worsening shortness of breath. Patient is currently residing at rehabilitation facility since November 2017, she is wheelchair bound. In the ED, chest x-ray showed mild pulmonary edema. She is on high-dose Demadex at home.  Subjective   This morning patient feels better, diuresed well with IV Lasix. Says the leg edema has improved.   Assessment/Plan:   Acute right heart failure - patient has history of right heart failure, per most recent ECHO 09/2016 EF 50-55% - here started on Lasix 60 mg IV BID and clinically improving but was Cr trending up - the dose of Lasix was lowered from 60 to 40 mg IV BID and yesterday 2/1 pt got onle one time dose lasix  - not sure how accurate weight is but has been ~ 373 lbs, this AM 371 lbs  - not sure we can get accurate weights using bed scale especially given pt's body habitus, morbid obesity  COPD exacerbation - less wheezing this AM - continue to taper down steroids  - changed Levaquin to doxycycline as INR is up  - continue bronchodilators scheduled and as needed   Paroxysmal atrial fibrillation - CHA2DS2VASc score is 3, patient is currently on Coumadin for anticoagulation - INR is still supra therapeutic but overall better, pharmacy to monitor with [place], appreciate assistance - continue Cardizem   Acute on Chronic kidney disease stage III - patient's creatinine 1.59 on admission - trending up and suspect from lasix - lowered the dose and frequency of Lasix as noted above, Cr is trending down  - may be able to resume BID lasix in am if Cr is better  - repeat BMP in  AM  Hypokalemia - supplemented and WNL this AM  Anemia of chronic disease - secondary to chronic kidney disease stage III - no signs of active bleeding - CBC In AM  Obesity hypoventilation syndrome, morbid obesity - Body mass index is 59.42 kg/m. - continue BiPAP at bedtime  History of left fibular fracture - patient sustained left fibular fracture in December, patient is currently at rehabilitation facility  Hypertension, essential - reasonable inpatient control   DVT prophylaxis: Patient on warfarin  Code Status: Full code  Family Communication: No family present at bedside  Disposition Plan: Back to skilled facility once she improves.and once blood work PT/INR, Cr stable   Consultants: None  Procedures: None   Antibiotics:   Anti-infectives    Start     Dose/Rate Route Frequency Ordered Stop   11/10/16 1100  doxycycline (VIBRA-TABS) tablet 100 mg     100 mg Oral Every 12 hours 11/10/16 1046     11/08/16 1800  levofloxacin (LEVAQUIN) tablet 500 mg  Status:  Discontinued     500 mg Oral Every 24 hours 11/07/16 2000 11/10/16 1046   11/08/16 1000  levofloxacin (LEVAQUIN) tablet 500 mg  Status:  Discontinued     500 mg Oral Daily 11/07/16 1852 11/07/16 2000   11/07/16 1730  levofloxacin (LEVAQUIN) tablet 750 mg     750 mg Oral  Once 11/07/16 1727 11/07/16 1734      Objective   Vitals:   11/12/16 0534 11/12/16 0754 11/12/16 1333 11/12/16  1545  BP: (!) 139/94   140/90  Pulse: 64  85 87  Resp:   20 20  Temp: 97.7 F (36.5 C)   98 F (36.7 C)  TempSrc: Oral   Oral  SpO2: 94% 95%  96%  Weight: (!) 168.6 kg (371 lb 9.6 oz)     Height:        Intake/Output Summary (Last 24 hours) at 11/12/16 1622 Last data filed at 11/12/16 0851  Gross per 24 hour  Intake              840 ml  Output                0 ml  Net              840 ml   Filed Weights   11/10/16 0635 11/11/16 0450 11/12/16 0534  Weight: (!) 172.1 kg (379 lb 6.4 oz) (!) 169.4 kg (373 lb 8 oz)  (!) 168.6 kg (371 lb 9.6 oz)     Physical Examination:  General exam: Appears calm and comfortable. Respiratory system: Clear bilaterally. Respiratory effort normal. Diminished breath sounds at bases Cardiovascular system:  RRR. No  murmurs, rubs, gallops. Bilateral 1+ pitting edema and ankle edema  GI system: Abdomen is nondistended, soft and nontender. No organomegaly.  Central nervous system. No focal neurological deficits. 5 x 5 power in all extremities. Psychiatry: Alert, oriented x 3.Judgement and insight appear normal. Affect normal.  Data Reviewed: I have personally reviewed following labs and imaging studies  CBG: No results for input(s): GLUCAP in the last 168 hours.  CBC:  Recent Labs Lab 11/07/16 1633 11/08/16 0714 11/09/16 0533 11/10/16 0505 11/11/16 0446 11/12/16 0553  WBC 9.5 5.3 6.1 5.1 5.6 6.3  NEUTROABS 7.2  --   --   --   --   --   HGB 8.2* 7.8* 7.5* 7.4* 8.7* 8.8*  HCT 28.6* 27.1* 25.6* 25.7* 30.9* 31.0*  MCV 86.4 85.8 85.9 85.1 85.6 84.7  PLT 179 151 173 178 203 [id number]    Basic Metabolic Panel:  Recent Labs Lab 11/08/16 0714 11/09/16 0533 11/10/16 0505 11/11/16 0446 11/12/16 0553  NA 141 139 136 139 142  K 3.5 3.5 3.4* 4.1 3.8  CL 95* 95* 94* 98* 99*  CO2 35* 33* 31 29 30   GLUCOSE 152* 131* 144* 166* 124*  BUN 34* 40* 47* 53* 56*  CREATININE 1.60* 1.88* 2.14* 2.17* 1.85*  CALCIUM 8.5* 8.5* 8.5* 8.7* 8.8*    Recent Results (from the past 240 hour(s))  MRSA PCR Screening     Status: None   Collection Time: 11/07/16  8:27 PM  Result Value Ref Range Status   MRSA by PCR NEGATIVE NEGATIVE Final    Comment:        The GeneXpert MRSA Assay (FDA approved for NASAL specimens only), is one component of a comprehensive MRSA colonization surveillance program. It is not intended to diagnose MRSA infection nor to guide or monitor treatment for MRSA infections.       Cardiac Enzymes:  Recent Labs Lab 11/07/16 1956 11/08/16 0025  11/08/16 0714  TROPONINI 0.04* 0.03* 0.03*   BNP (last 3 results)  Recent Labs  09/05/16 1555 09/18/16 1126 11/07/16 1633  BNP 665.3* 991.0* 490.4*   Studies: No results found.  Scheduled Meds: . carvedilol  6.25 mg Oral BID WC  . diltiazem  180 mg Oral Daily  . docusate sodium  100 mg Oral BID  . doxycycline  100 mg Oral Q12H  . ferrous sulfate  325 mg Oral TID WC  . furosemide  40 mg Intravenous Daily  . gabapentin  300 mg Oral QHS  . ipratropium-albuterol  3 mL Nebulization TID  . methylPREDNISolone (SOLU-MEDROL) injection  40 mg Intravenous Daily  . propafenone  150 mg Oral Q8H  . sodium chloride flush  3 mL Intravenous Q12H  . Warfarin - Pharmacist Dosing Inpatient   Does not apply q1800    Time spent: 25 min  Faye Ramsay, MD   [facility] Pager [phone]. If 7PM-7AM, please contact night-coverage at [email or web address], Office  [phone]  password [email or web address] 11/12/2016, 4:22 PM  LOS: 4 days

## 2016-11-12 NOTE — Care Management Important Message (Signed)
Important Message  Patient Details  Name: Paige Marshall MRN: [id number] Date of Birth: [birth year]   Medicare Important Message Given:  Yes    Soley Harriss [address] 11/12/2016, 11:48 AM

## 2016-11-12 NOTE — Progress Notes (Addendum)
Paged MD regarding pt has not had BM in several days. Pt takes colace daily and still states she does not have to go. Pt stated this morning she wanted to drink coffee and hold off on ordering a laxative. Pt passing gas and now requesting laxative, states milk of magnesia does not help. Awaiting call back   Paige Marshall

## 2016-11-13 DIAGNOSIS — R1030 Lower abdominal pain, unspecified: Secondary | ICD-10-CM | POA: Diagnosis not present

## 2016-11-13 DIAGNOSIS — M6281 Muscle weakness (generalized): Secondary | ICD-10-CM | POA: Diagnosis not present

## 2016-11-13 DIAGNOSIS — Z978 Presence of other specified devices: Secondary | ICD-10-CM | POA: Diagnosis not present

## 2016-11-13 DIAGNOSIS — I132 Hypertensive heart and chronic kidney disease with heart failure and with stage 5 chronic kidney disease, or end stage renal disease: Secondary | ICD-10-CM | POA: Diagnosis not present

## 2016-11-13 DIAGNOSIS — I619 Nontraumatic intracerebral hemorrhage, unspecified: Secondary | ICD-10-CM | POA: Diagnosis not present

## 2016-11-13 DIAGNOSIS — J441 Chronic obstructive pulmonary disease with (acute) exacerbation: Secondary | ICD-10-CM | POA: Diagnosis not present

## 2016-11-13 DIAGNOSIS — Z8679 Personal history of other diseases of the circulatory system: Secondary | ICD-10-CM | POA: Diagnosis not present

## 2016-11-13 DIAGNOSIS — R4701 Aphasia: Secondary | ICD-10-CM | POA: Diagnosis not present

## 2016-11-13 DIAGNOSIS — R6521 Severe sepsis with septic shock: Secondary | ICD-10-CM | POA: Diagnosis not present

## 2016-11-13 DIAGNOSIS — L89142 Pressure ulcer of left lower back, stage 2: Secondary | ICD-10-CM | POA: Diagnosis not present

## 2016-11-13 DIAGNOSIS — I2729 Other secondary pulmonary hypertension: Secondary | ICD-10-CM | POA: Diagnosis not present

## 2016-11-13 DIAGNOSIS — R100 Acute abdomen: Secondary | ICD-10-CM | POA: Diagnosis not present

## 2016-11-13 DIAGNOSIS — I10 Essential (primary) hypertension: Secondary | ICD-10-CM | POA: Diagnosis not present

## 2016-11-13 DIAGNOSIS — N186 End stage renal disease: Secondary | ICD-10-CM | POA: Diagnosis not present

## 2016-11-13 DIAGNOSIS — J9620 Acute and chronic respiratory failure, unspecified whether with hypoxia or hypercapnia: Secondary | ICD-10-CM | POA: Diagnosis not present

## 2016-11-13 DIAGNOSIS — I481 Persistent atrial fibrillation: Secondary | ICD-10-CM | POA: Diagnosis not present

## 2016-11-13 DIAGNOSIS — M1990 Unspecified osteoarthritis, unspecified site: Secondary | ICD-10-CM | POA: Diagnosis not present

## 2016-11-13 DIAGNOSIS — I130 Hypertensive heart and chronic kidney disease with heart failure and stage 1 through stage 4 chronic kidney disease, or unspecified chronic kidney disease: Secondary | ICD-10-CM | POA: Diagnosis not present

## 2016-11-13 DIAGNOSIS — B952 Enterococcus as the cause of diseases classified elsewhere: Secondary | ICD-10-CM | POA: Diagnosis not present

## 2016-11-13 DIAGNOSIS — N184 Chronic kidney disease, stage 4 (severe): Secondary | ICD-10-CM | POA: Diagnosis not present

## 2016-11-13 DIAGNOSIS — Z9989 Dependence on other enabling machines and devices: Secondary | ICD-10-CM | POA: Diagnosis not present

## 2016-11-13 DIAGNOSIS — E119 Type 2 diabetes mellitus without complications: Secondary | ICD-10-CM | POA: Diagnosis not present

## 2016-11-13 DIAGNOSIS — R062 Wheezing: Secondary | ICD-10-CM | POA: Diagnosis not present

## 2016-11-13 DIAGNOSIS — I509 Heart failure, unspecified: Secondary | ICD-10-CM

## 2016-11-13 DIAGNOSIS — E875 Hyperkalemia: Secondary | ICD-10-CM | POA: Diagnosis not present

## 2016-11-13 DIAGNOSIS — R5381 Other malaise: Secondary | ICD-10-CM | POA: Diagnosis not present

## 2016-11-13 DIAGNOSIS — A4181 Sepsis due to Enterococcus: Secondary | ICD-10-CM | POA: Diagnosis not present

## 2016-11-13 DIAGNOSIS — J9622 Acute and chronic respiratory failure with hypercapnia: Secondary | ICD-10-CM | POA: Diagnosis not present

## 2016-11-13 DIAGNOSIS — R1031 Right lower quadrant pain: Secondary | ICD-10-CM | POA: Diagnosis not present

## 2016-11-13 DIAGNOSIS — I517 Cardiomegaly: Secondary | ICD-10-CM | POA: Diagnosis not present

## 2016-11-13 DIAGNOSIS — N185 Chronic kidney disease, stage 5: Secondary | ICD-10-CM | POA: Diagnosis not present

## 2016-11-13 DIAGNOSIS — R410 Disorientation, unspecified: Secondary | ICD-10-CM | POA: Diagnosis not present

## 2016-11-13 DIAGNOSIS — Z6841 Body Mass Index (BMI) 40.0 and over, adult: Secondary | ICD-10-CM | POA: Diagnosis not present

## 2016-11-13 DIAGNOSIS — I2721 Secondary pulmonary arterial hypertension: Secondary | ICD-10-CM | POA: Diagnosis not present

## 2016-11-13 DIAGNOSIS — D689 Coagulation defect, unspecified: Secondary | ICD-10-CM | POA: Diagnosis not present

## 2016-11-13 DIAGNOSIS — I5021 Acute systolic (congestive) heart failure: Secondary | ICD-10-CM | POA: Diagnosis not present

## 2016-11-13 DIAGNOSIS — N183 Chronic kidney disease, stage 3 (moderate): Secondary | ICD-10-CM | POA: Diagnosis not present

## 2016-11-13 DIAGNOSIS — I5043 Acute on chronic combined systolic (congestive) and diastolic (congestive) heart failure: Secondary | ICD-10-CM | POA: Diagnosis not present

## 2016-11-13 DIAGNOSIS — I6032 Nontraumatic subarachnoid hemorrhage from left posterior communicating artery: Secondary | ICD-10-CM | POA: Diagnosis not present

## 2016-11-13 DIAGNOSIS — I472 Ventricular tachycardia: Secondary | ICD-10-CM | POA: Diagnosis not present

## 2016-11-13 DIAGNOSIS — I482 Chronic atrial fibrillation: Secondary | ICD-10-CM | POA: Diagnosis not present

## 2016-11-13 DIAGNOSIS — E876 Hypokalemia: Secondary | ICD-10-CM | POA: Diagnosis not present

## 2016-11-13 DIAGNOSIS — J810 Acute pulmonary edema: Secondary | ICD-10-CM | POA: Diagnosis not present

## 2016-11-13 DIAGNOSIS — I480 Paroxysmal atrial fibrillation: Secondary | ICD-10-CM | POA: Diagnosis not present

## 2016-11-13 DIAGNOSIS — R7881 Bacteremia: Secondary | ICD-10-CM | POA: Diagnosis not present

## 2016-11-13 DIAGNOSIS — R52 Pain, unspecified: Secondary | ICD-10-CM | POA: Diagnosis not present

## 2016-11-13 DIAGNOSIS — I4892 Unspecified atrial flutter: Secondary | ICD-10-CM | POA: Diagnosis not present

## 2016-11-13 DIAGNOSIS — I700 Atherosclerosis of aorta: Secondary | ICD-10-CM | POA: Diagnosis not present

## 2016-11-13 DIAGNOSIS — I610 Nontraumatic intracerebral hemorrhage in hemisphere, subcortical: Secondary | ICD-10-CM | POA: Diagnosis not present

## 2016-11-13 DIAGNOSIS — D649 Anemia, unspecified: Secondary | ICD-10-CM | POA: Diagnosis not present

## 2016-11-13 DIAGNOSIS — M84364D Stress fracture, left fibula, subsequent encounter for fracture with routine healing: Secondary | ICD-10-CM | POA: Diagnosis not present

## 2016-11-13 DIAGNOSIS — G936 Cerebral edema: Secondary | ICD-10-CM | POA: Diagnosis not present

## 2016-11-13 DIAGNOSIS — N170 Acute kidney failure with tubular necrosis: Secondary | ICD-10-CM | POA: Diagnosis not present

## 2016-11-13 DIAGNOSIS — J984 Other disorders of lung: Secondary | ICD-10-CM | POA: Diagnosis not present

## 2016-11-13 DIAGNOSIS — I6200 Nontraumatic subdural hemorrhage, unspecified: Secondary | ICD-10-CM | POA: Diagnosis not present

## 2016-11-13 DIAGNOSIS — R609 Edema, unspecified: Secondary | ICD-10-CM | POA: Diagnosis not present

## 2016-11-13 DIAGNOSIS — R0602 Shortness of breath: Secondary | ICD-10-CM | POA: Diagnosis not present

## 2016-11-13 DIAGNOSIS — Z1621 Resistance to vancomycin: Secondary | ICD-10-CM | POA: Diagnosis not present

## 2016-11-13 DIAGNOSIS — Z9981 Dependence on supplemental oxygen: Secondary | ICD-10-CM | POA: Diagnosis not present

## 2016-11-13 DIAGNOSIS — J989 Respiratory disorder, unspecified: Secondary | ICD-10-CM | POA: Diagnosis not present

## 2016-11-13 DIAGNOSIS — Z79899 Other long term (current) drug therapy: Secondary | ICD-10-CM | POA: Diagnosis not present

## 2016-11-13 DIAGNOSIS — G9341 Metabolic encephalopathy: Secondary | ICD-10-CM | POA: Diagnosis not present

## 2016-11-13 DIAGNOSIS — J9611 Chronic respiratory failure with hypoxia: Secondary | ICD-10-CM | POA: Diagnosis not present

## 2016-11-13 DIAGNOSIS — N179 Acute kidney failure, unspecified: Secondary | ICD-10-CM | POA: Diagnosis not present

## 2016-11-13 DIAGNOSIS — I5084 End stage heart failure: Secondary | ICD-10-CM | POA: Diagnosis present

## 2016-11-13 DIAGNOSIS — K5900 Constipation, unspecified: Secondary | ICD-10-CM | POA: Diagnosis not present

## 2016-11-13 DIAGNOSIS — J9621 Acute and chronic respiratory failure with hypoxia: Secondary | ICD-10-CM | POA: Diagnosis not present

## 2016-11-13 DIAGNOSIS — Z7901 Long term (current) use of anticoagulants: Secondary | ICD-10-CM | POA: Diagnosis not present

## 2016-11-13 DIAGNOSIS — Z9889 Other specified postprocedural states: Secondary | ICD-10-CM | POA: Diagnosis not present

## 2016-11-13 DIAGNOSIS — D7582 Heparin induced thrombocytopenia (HIT): Secondary | ICD-10-CM | POA: Diagnosis present

## 2016-11-13 DIAGNOSIS — J90 Pleural effusion, not elsewhere classified: Secondary | ICD-10-CM | POA: Diagnosis not present

## 2016-11-13 DIAGNOSIS — I4891 Unspecified atrial fibrillation: Secondary | ICD-10-CM | POA: Diagnosis not present

## 2016-11-13 DIAGNOSIS — J449 Chronic obstructive pulmonary disease, unspecified: Secondary | ICD-10-CM | POA: Diagnosis present

## 2016-11-13 DIAGNOSIS — G4733 Obstructive sleep apnea (adult) (pediatric): Secondary | ICD-10-CM | POA: Diagnosis not present

## 2016-11-13 DIAGNOSIS — E2740 Unspecified adrenocortical insufficiency: Secondary | ICD-10-CM | POA: Diagnosis not present

## 2016-11-13 DIAGNOSIS — R069 Unspecified abnormalities of breathing: Secondary | ICD-10-CM | POA: Diagnosis not present

## 2016-11-13 DIAGNOSIS — E662 Morbid (severe) obesity with alveolar hypoventilation: Secondary | ICD-10-CM | POA: Diagnosis not present

## 2016-11-13 DIAGNOSIS — R110 Nausea: Secondary | ICD-10-CM | POA: Diagnosis not present

## 2016-11-13 DIAGNOSIS — M25561 Pain in right knee: Secondary | ICD-10-CM | POA: Diagnosis not present

## 2016-11-13 DIAGNOSIS — I959 Hypotension, unspecified: Secondary | ICD-10-CM | POA: Diagnosis not present

## 2016-11-13 DIAGNOSIS — Z9119 Patient's noncompliance with other medical treatment and regimen: Secondary | ICD-10-CM | POA: Diagnosis not present

## 2016-11-13 DIAGNOSIS — N189 Chronic kidney disease, unspecified: Secondary | ICD-10-CM | POA: Diagnosis not present

## 2016-11-13 DIAGNOSIS — R0902 Hypoxemia: Secondary | ICD-10-CM | POA: Diagnosis not present

## 2016-11-13 DIAGNOSIS — D631 Anemia in chronic kidney disease: Secondary | ICD-10-CM | POA: Diagnosis not present

## 2016-11-13 DIAGNOSIS — E669 Obesity, unspecified: Secondary | ICD-10-CM | POA: Diagnosis not present

## 2016-11-13 DIAGNOSIS — M069 Rheumatoid arthritis, unspecified: Secondary | ICD-10-CM | POA: Diagnosis not present

## 2016-11-13 DIAGNOSIS — Z01810 Encounter for preprocedural cardiovascular examination: Secondary | ICD-10-CM | POA: Diagnosis not present

## 2016-11-13 DIAGNOSIS — I484 Atypical atrial flutter: Secondary | ICD-10-CM | POA: Diagnosis not present

## 2016-11-13 DIAGNOSIS — Z1639 Resistance to other specified antimicrobial drug: Secondary | ICD-10-CM | POA: Diagnosis not present

## 2016-11-13 DIAGNOSIS — A419 Sepsis, unspecified organism: Secondary | ICD-10-CM | POA: Diagnosis not present

## 2016-11-13 DIAGNOSIS — I629 Nontraumatic intracranial hemorrhage, unspecified: Secondary | ICD-10-CM | POA: Diagnosis not present

## 2016-11-13 DIAGNOSIS — I9589 Other hypotension: Secondary | ICD-10-CM | POA: Diagnosis not present

## 2016-11-13 DIAGNOSIS — I5033 Acute on chronic diastolic (congestive) heart failure: Secondary | ICD-10-CM | POA: Diagnosis not present

## 2016-11-13 DIAGNOSIS — I340 Nonrheumatic mitral (valve) insufficiency: Secondary | ICD-10-CM | POA: Diagnosis not present

## 2016-11-13 DIAGNOSIS — R1032 Left lower quadrant pain: Secondary | ICD-10-CM | POA: Diagnosis not present

## 2016-11-13 DIAGNOSIS — D696 Thrombocytopenia, unspecified: Secondary | ICD-10-CM | POA: Diagnosis not present

## 2016-11-13 DIAGNOSIS — M109 Gout, unspecified: Secondary | ICD-10-CM | POA: Diagnosis not present

## 2016-11-13 DIAGNOSIS — E2749 Other adrenocortical insufficiency: Secondary | ICD-10-CM | POA: Diagnosis present

## 2016-11-13 DIAGNOSIS — J9601 Acute respiratory failure with hypoxia: Secondary | ICD-10-CM | POA: Diagnosis not present

## 2016-11-13 DIAGNOSIS — F419 Anxiety disorder, unspecified: Secondary | ICD-10-CM | POA: Diagnosis not present

## 2016-11-13 DIAGNOSIS — J80 Acute respiratory distress syndrome: Secondary | ICD-10-CM | POA: Diagnosis not present

## 2016-11-13 DIAGNOSIS — I129 Hypertensive chronic kidney disease with stage 1 through stage 4 chronic kidney disease, or unspecified chronic kidney disease: Secondary | ICD-10-CM | POA: Diagnosis not present

## 2016-11-13 DIAGNOSIS — R570 Cardiogenic shock: Secondary | ICD-10-CM | POA: Diagnosis not present

## 2016-11-13 DIAGNOSIS — Z452 Encounter for adjustment and management of vascular access device: Secondary | ICD-10-CM | POA: Diagnosis not present

## 2016-11-13 DIAGNOSIS — G8929 Other chronic pain: Secondary | ICD-10-CM | POA: Diagnosis not present

## 2016-11-13 DIAGNOSIS — I611 Nontraumatic intracerebral hemorrhage in hemisphere, cortical: Secondary | ICD-10-CM | POA: Diagnosis not present

## 2016-11-13 DIAGNOSIS — D65 Disseminated intravascular coagulation [defibrination syndrome]: Secondary | ICD-10-CM | POA: Diagnosis not present

## 2016-11-13 DIAGNOSIS — Z992 Dependence on renal dialysis: Secondary | ICD-10-CM | POA: Diagnosis not present

## 2016-11-13 DIAGNOSIS — R262 Difficulty in walking, not elsewhere classified: Secondary | ICD-10-CM | POA: Diagnosis not present

## 2016-11-13 DIAGNOSIS — R791 Abnormal coagulation profile: Secondary | ICD-10-CM | POA: Diagnosis not present

## 2016-11-13 LAB — BASIC METABOLIC PANEL
ANION GAP: 8 (ref 5–15)
BUN: 58 mg/dL — AB (ref 6–20)
CO2: 34 mmol/L — AB (ref 22–32)
Calcium: 8.8 mg/dL — ABNORMAL LOW (ref 8.9–10.3)
Chloride: 100 mmol/L — ABNORMAL LOW (ref 101–111)
Creatinine, Ser: 1.97 mg/dL — ABNORMAL HIGH (ref 0.44–1.00)
GFR calc Af Amer: 31 mL/min — ABNORMAL LOW (ref >60)
GFR calc non Af Amer: 27 mL/min — ABNORMAL LOW (ref >60)
GLUCOSE: 128 mg/dL — AB (ref 65–99)
POTASSIUM: 4.1 mmol/L (ref 3.5–5.1)
Sodium: 142 mmol/L (ref 135–145)

## 2016-11-13 LAB — CBC
HCT: 30.9 % — ABNORMAL LOW (ref 36.0–46.0)
Hemoglobin: 8.8 g/dL — ABNORMAL LOW (ref 12.0–15.0)
MCH: 24.4 pg — AB (ref 26.0–34.0)
MCHC: 28.5 g/dL — ABNORMAL LOW (ref 30.0–36.0)
MCV: 85.6 fL (ref 78.0–100.0)
Platelets: 224 10*3/uL (ref 150–400)
RBC: 3.61 MIL/uL — AB (ref 3.87–5.11)
RDW: 27.5 % — AB (ref 11.5–15.5)
WBC: 6.6 10*3/uL (ref 4.0–10.5)

## 2016-11-13 LAB — PROTIME-INR
INR: 4.12
Prothrombin Time: 41 seconds — ABNORMAL HIGH (ref 11.4–15.2)

## 2016-11-13 MED ORDER — ZOLPIDEM TARTRATE 5 MG PO TABS: 5.0000 mg | ORAL_TABLET | Freq: Every evening | ORAL | 0 refills | Status: DC | PRN

## 2016-11-13 MED ORDER — DOXYCYCLINE HYCLATE 100 MG PO TABS: 100.0000 mg | ORAL_TABLET | Freq: Two times a day (BID) | ORAL | 0 refills | Status: DC

## 2016-11-13 MED ORDER — PROPAFENONE HCL 150 MG PO TABS: 150.0000 mg | ORAL_TABLET | Freq: Three times a day (TID) | ORAL | 0 refills | Status: DC

## 2016-11-13 MED ORDER — FUROSEMIDE 40 MG PO TABS: 40.0000 mg | ORAL_TABLET | Freq: Two times a day (BID) | ORAL | 0 refills | Status: DC

## 2016-11-13 MED ORDER — PREDNISONE 10 MG PO TABS: ORAL_TABLET | ORAL | 0 refills | Status: DC

## 2016-11-13 MED ORDER — WARFARIN SODIUM 1 MG PO TABS: ORAL_TABLET | ORAL | Status: DC

## 2016-11-13 MED ORDER — BISACODYL 5 MG PO TBEC: 5.0000 mg | DELAYED_RELEASE_TABLET | Freq: Every day | ORAL | Status: DC | PRN

## 2016-11-13 MED ORDER — OXYCODONE HCL 5 MG PO TABS: 5.0000 mg | ORAL_TABLET | Freq: Four times a day (QID) | ORAL | 0 refills | Status: DC | PRN

## 2016-11-13 MED ORDER — BISACODYL 5 MG PO TBEC: 5.0000 mg | DELAYED_RELEASE_TABLET | Freq: Every day | ORAL | 0 refills | Status: AC | PRN

## 2016-11-13 NOTE — Discharge Summary (Signed)
Physician Discharge Summary  Paige Marshall [id number] DOB: 02/06/1956 DOA: 11/07/2016  PCP: Kerin Perna, NP  Admit date: 11/07/2016 Discharge date: 11/13/2016  Recommendations for Outpatient Follow-up:  1. Pt will need to follow up with PCP in 1 week post discharge 2. Please obtain BMP to evaluate electrolytes and kidney function 3. Please also check CBC to evaluate Hg and Hct levels 4. Please note that pt is on Coumadin but INR >4, this needs to be monitored closely and coumadin resumed once INR at target range 2-3 5. Please note that weight on discharge 370 lbs, monitor and readjust the dose of Lasix as clinically indicated   Discharge Diagnoses:  Active Problems:   PAF (paroxysmal atrial fibrillation) (HCC)   Obesity hypoventilation syndrome (HCC)   Uncontrolled hypertension   COPD mixed type (HCC)   CKD (chronic kidney disease) stage 4, GFR 15-29 ml/min (HCC)   Pulmonary hypertension   Right heart failure, NYHA class 3   Acute on chronic respiratory failure (HCC)   Acute exacerbation of CHF (congestive heart failure) ([place])  Discharge Condition: Stable  Diet recommendation: Heart healthy diet discussed in details   Brief HPI:   61 y.o.female,With history of COPD on 3 L oxygen at home, atrial fibrillation on anticoagulation with Coumadin, right heart failure, pulmonary hypertension who came to hospital with worsening bilateral leg swelling and worsening shortness of breath. Patient is currently residing at rehabilitation facility since November 2017,she is wheelchair bound. In the ED, chest x-ray showed mild pulmonary edema.She is on high-dose Demadex at home.  Subjective   This morning patient feels better, diuresed well with IV Lasix. Says the leg edema has improved.   Assessment/Plan:   Acute right heart failure - patient has history of right heart failure, per most recent ECHO 09/2016 EF 50-55% - here started on Lasix 60 mg IV BID and clinically  improving but was Cr trending up - the dose of Lasix was lowered from 60 to 40 mg IV BID,pt insisting to be discharged today and I think this is reasonable but pt must be monitored clinically  - change to lasix 40 mg BID PO  - not sure how accurate weight is but has been ~ 373 lbs, this AM 370 lbs   COPD exacerbation - less wheezing this AM - continue to taper down steroids  - continue Doxycycline to complete therapy  - continue bronchodilators as needed   Paroxysmal atrial fibrillation - CHA2DS2VASc score is 3, patient is currently on Coumadin for anticoagulation -INR is still supra therapeutic but overall better, pharmacy to monitor with [place], appreciate assistance - continue Cardizem   Acute on Chronic kidney disease stage III - patient's creatinine 1.59 on admission - trending up and suspect from lasix - lowered the dose and frequency of Lasix as noted above, Cr is trending down   Hypokalemia - supplemented   Anemia of chronic disease - secondary to chronic kidney disease stage III - no signs of active bleeding  Obesity hypoventilation syndrome, morbid obesity - Body mass index is 59.42 kg/m. - continue BiPAP at bedtime  History of left fibular fracture - patient sustained left fibular fracture in December,patient is currently at rehabilitation facility  Hypertension, essential - reasonable inpatient control   DVT prophylaxis: Patient on warfarin but INR supra therapeutic   Code Status: Full code  Family Communication: No family present at bedside  Disposition Plan: Back to skilled facility    Procedures/Studies: Dg Chest Port 1 View  Result Date: 11/07/2016  CLINICAL DATA:  Patient with shortness of breath and congestive heart failure. EXAM: PORTABLE CHEST 1 VIEW COMPARISON:  Chest radiograph 09/18/2016. FINDINGS: Stable enlarged cardiac and mediastinal contours. Interval development of heterogeneous opacities within the mid and lower lungs  bilaterally. Probable small left pleural effusion. IMPRESSION: Cardiomegaly and basilar opacities favored to represent mild edema. Possible small left pleural effusion. Electronically Signed   By: Lovey Newcomer M.D.   On: 11/07/2016 17:00     Discharge Exam: Vitals:   11/12/16 2021 11/13/16 0709  BP: 134/83 (!) 132/96  Pulse: 90 (!) 115  Resp: 19 20  Temp: 98 F (36.7 C) 97.5 F (36.4 C)   Vitals:   11/12/16 1545 11/12/16 2021 11/13/16 0709 11/13/16 0851  BP: 140/90 134/83 (!) 132/96   Pulse: 87 90 (!) 115   Resp: 20 19 20    Temp: 98 F (36.7 C) 98 F (36.7 C) 97.5 F (36.4 C)   TempSrc: Oral Oral Oral   SpO2: 96% 97% 98% 99%  Weight:   (!) 167.9 kg (370 lb 3.2 oz)   Height:        General: Pt is alert, follows commands appropriately, not in acute distress Cardiovascular: Regular rhythm, tachycardic, S1/S2 +, no rubs, no gallops Respiratory: Clear to auscultation bilaterally, no wheezing, no crackles, no rhonchi Abdominal: Soft, non tender, non distended, bowel sounds +, no guarding Extremities: +1 bilateral LE edema   Discharge Instructions  Discharge Instructions    Diet - low sodium heart healthy    Complete by:  As directed    Increase activity slowly    Complete by:  As directed      Allergies as of 11/13/2016      Reactions   Penicillins Hives, Itching, Swelling, Other (See Comments)   Tongue swelling Has patient had a PCN reaction causing immediate rash, facial/tongue/throat swelling, SOB or lightheadedness with hypotension: Yes Clarified with the patient her penicillin allergy. Pt has tolerated cefepime in the past. Pt also states that she can take amoxicillin.   Citrus Hives      Medication List    STOP taking these medications   torsemide 20 MG tablet Commonly known as:  DEMADEX     TAKE these medications   ARTIFICIAL TEARS 0.4 % Soln Generic drug:  Hypromellose Place 1 drop into both eyes every 6 (six) hours as needed (dry eyes).   bisacodyl 10  MG suppository Commonly known as:  DULCOLAX Place 10 mg rectally daily as needed (constipation if no results from MOM). What changed:  Another medication with the same name was added. Make sure you understand how and when to take each.   bisacodyl 5 MG EC tablet Commonly known as:  DULCOLAX Take 1 tablet (5 mg total) by mouth daily as needed for moderate constipation. What changed:  You were already taking a medication with the same name, and this prescription was added. Make sure you understand how and when to take each.   carvedilol 6.25 MG tablet Commonly known as:  COREG Take 1 tablet (6.25 mg total) by mouth 2 (two) times daily with a meal.   diclofenac sodium 1 % Gel Commonly known as:  VOLTAREN Apply 4 g topically every 6 (six) hours as needed (pain).   diltiazem 180 MG 24 hr capsule Commonly known as:  DILACOR XR Take 180 mg by mouth daily.   docusate sodium 100 MG capsule Commonly known as:  COLACE Take 1 capsule (100 mg total) by mouth 2 (two) times  daily.   doxycycline 100 MG tablet Commonly known as:  VIBRA-TABS Take 1 tablet (100 mg total) by mouth every 12 (twelve) hours.   ferrous sulfate 325 (65 FE) MG tablet Take 1 tablet (325 mg total) by mouth 3 (three) times daily with meals.   furosemide 40 MG tablet Commonly known as:  LASIX Take 1 tablet (40 mg total) by mouth 2 (two) times daily.   gabapentin 300 MG capsule Commonly known as:  NEURONTIN Take 1 capsule (300 mg total) by mouth at bedtime.   multivitamin with minerals Tabs tablet Take 1 tablet by mouth daily.   omega-3 acid ethyl esters 1 g capsule Commonly known as:  LOVAZA Take 1 g by mouth 2 (two) times daily.   oseltamivir 75 MG capsule Commonly known as:  TAMIFLU Take 75 mg by mouth daily. Order date: 11/03/16 - give until 11/14/16   oxyCODONE 5 MG immediate release tablet Commonly known as:  Oxy IR/ROXICODONE Take 1 tablet (5 mg total) by mouth every 6 (six) hours as needed for  breakthrough pain. What changed:  reasons to take this   OXYGEN Inhale 3 L into the lungs continuous.   polyethylene glycol packet Commonly known as:  MIRALAX / GLYCOLAX Take 17 g by mouth daily as needed. What changed:  reasons to take this  additional instructions   predniSONE 10 MG tablet Commonly known as:  DELTASONE Take 40 mg tablet 11/14/2016 and taper down by 10 mg daily until completed   PRESCRIPTION MEDICATION Inhale into the lungs at bedtime. BIPAP   PROAIR HFA 108 (90 Base) MCG/ACT inhaler Generic drug:  albuterol INHALE 2 PUFFS EVERY 6 HOURS AS NEEDED FOR WHEEZING OR SHORTNESS OF BREATH. What changed:  See the new instructions.   promethazine 25 MG tablet Commonly known as:  PHENERGAN Take 25 mg by mouth every 6 (six) hours as needed for nausea or vomiting.   propafenone 150 MG tablet Commonly known as:  RYTHMOL Take 1 tablet (150 mg total) by mouth every 8 (eight) hours.   SENNA S 8.6-50 MG tablet Generic drug:  senna-docusate Take 2 tablets by mouth See admin instructions. Take 2 tablet by mouth daily at bedtime, may also take 2 tablets as needed for constipation   warfarin 1 MG tablet Commonly known as:  COUMADIN You must have PT/INR checked before taking this medication What changed:  medication strength  how much to take  how to take this  when to take this  additional instructions  Another medication with the same name was removed. Continue taking this medication, and follow the directions you see here.   zolpidem 5 MG tablet Commonly known as:  AMBIEN Take 1 tablet (5 mg total) by mouth at bedtime as needed for sleep.        Contact information for follow-up providers    EDWARDS, MICHELLE P, NP Follow up.   Specialty:  Internal Medicine Contact information: [place] [address] [phone]            Contact information for after-discharge care    [facility] Follow  up.   Specialty:  [place] information: [address] [place] [phone]                   The results of significant diagnostics from this hospitalization (including imaging, microbiology, ancillary and laboratory) are listed below for reference.     Microbiology: Recent  Results (from the past 240 hour(s))  MRSA PCR Screening     Status: None   Collection Time: 11/07/16  8:27 PM  Result Value Ref Range Status   MRSA by PCR NEGATIVE NEGATIVE Final    Comment:        The GeneXpert MRSA Assay (FDA approved for NASAL specimens only), is one component of a comprehensive MRSA colonization surveillance program. It is not intended to diagnose MRSA infection nor to guide or monitor treatment for MRSA infections.      Labs: Basic Metabolic Panel:  Recent Labs Lab 11/09/16 0533 11/10/16 0505 11/11/16 0446 11/12/16 0553 11/13/16 0345  NA 139 136 139 142 142  K 3.5 3.4* 4.1 3.8 4.1  CL 95* 94* 98* 99* 100*  CO2 33* 31 29 30  34*  GLUCOSE 131* 144* 166* 124* 128*  BUN 40* 47* 53* 56* 58*  CREATININE 1.88* 2.14* 2.17* 1.85* 1.97*  CALCIUM 8.5* 8.5* 8.7* 8.8* 8.8*   CBC:  Recent Labs Lab 11/07/16 1633  11/09/16 0533 11/10/16 0505 11/11/16 0446 11/12/16 0553 11/13/16 0345  WBC 9.5  < > 6.1 5.1 5.6 6.3 6.6  NEUTROABS 7.2  --   --   --   --   --   --   HGB 8.2*  < > 7.5* 7.4* 8.7* 8.8* 8.8*  HCT 28.6*  < > 25.6* 25.7* 30.9* 31.0* 30.9*  MCV 86.4  < > 85.9 85.1 85.6 84.7 85.6  PLT 179  < > 173 178 203 200 224  < > = values in this interval not displayed. Cardiac Enzymes:  Recent Labs Lab 11/07/16 1956 11/08/16 0025 11/08/16 0714  TROPONINI 0.04* 0.03* 0.03*   BNP: BNP (last 3 results)  Recent Labs  09/05/16 1555 09/18/16 1126 11/07/16 1633  BNP 665.3* 991.0* 490.4*    SIGNED: Time coordinating discharge: 30 minutes  MAGICK-Darene Nappi, MD  [facility] 11/13/2016, 9:21 AM Pager  [phone]  If 7PM-7AM, please contact night-coverage [email or web address] Password [email or web address]

## 2016-11-13 NOTE — Progress Notes (Signed)
CRITICAL VALUE ALERT  Critical value received:  INR  Date of notification:  11/13/16  Time of notification:  [id number]  Critical value read back:Yes.    Nurse who received alert:  r.Baran Kuhrt  MD notified (1st page):  Hal Hope  Time of first page:  0550  MD notified (2nd page):  Time of second page:  Responding MD:  Dr Hal Hope  Time MD responded:  [phone]

## 2016-11-13 NOTE — Discharge Summary (Signed)
Pt got discharge back to [facility] back today, report provided to the receiving nurse, pt's vitals stable, Discharge instructions provided, tele Dc, IV DC, pt left the Unit with [facility] with all of her belongings in a stable condition.

## 2016-11-13 NOTE — Clinical Social Work Note (Signed)
Clinical Social Worker facilitated patient discharge including contacting patient family and facility to confirm patient discharge plans.  Clinical information faxed to facility and family agreeable with plan.  CSW arranged ambulance transport via [facility] to [facility] .  RN to call report [phone] prior to discharge.  Clinical Social Worker will sign off for now as social work intervention is no longer needed. Please consult [place] again if new need arises.  Mayah Urquidi B. Joline Maxcy Clinical Social Work Dept Weekend Social Worker [phone] 10:08 AM

## 2016-11-13 NOTE — Clinical Social Work Placement (Signed)
   CLINICAL SOCIAL WORK PLACEMENT  NOTE  Date:  11/13/2016  Patient Details  Name: Paige Marshall MRN: [id number] Date of Birth: 12-24-1955  Clinical Social Work is seeking post-discharge placement for this patient at the [facility] level of care (*CSW will initial, date and re-position this form in  chart as items are completed):  Yes   Patient/family provided with [facility] Work Department's list of facilities offering this level of care within the geographic area requested by the patient (or if unable, by the patient's family).  Yes   Patient/family informed of their freedom to choose among providers that offer the needed level of care, that participate in Medicare, Medicaid or managed care program needed by the patient, have an available bed and are willing to accept the patient.  Yes   Patient/family informed of 's ownership interest in [facility] and [facility], as well as of the fact that they are under no obligation to receive care at these facilities.  PASRR submitted to EDS on       PASRR number received on       Existing PASRR number confirmed on 11/13/16     FL2 transmitted to all facilities in geographic area requested by pt/family on 11/13/16     FL2 transmitted to all facilities within larger geographic area on       Patient informed that his/her managed care company has contracts with or will negotiate with certain facilities, including the following:        Yes   Patient/family informed of bed offers received.  Patient chooses bed at  (Pt from [facility])     Physician recommends and patient chooses bed at      Patient to be transferred to   on 11/13/16.  Patient to be transferred to facility by  Corey Harold)     Patient family notified on 11/13/16 of transfer.  Name of family member notified:  Porfirio Mylar [phone]     PHYSICIAN       Additional Comment:     _______________________________________________ Serafina Mitchell, LCSWA 11/13/2016, 10:12 AM

## 2016-11-16 DIAGNOSIS — D631 Anemia in chronic kidney disease: Secondary | ICD-10-CM | POA: Diagnosis not present

## 2016-11-16 DIAGNOSIS — N183 Chronic kidney disease, stage 3 (moderate): Secondary | ICD-10-CM | POA: Diagnosis not present

## 2016-11-16 DIAGNOSIS — R52 Pain, unspecified: Secondary | ICD-10-CM | POA: Diagnosis not present

## 2016-11-16 DIAGNOSIS — J441 Chronic obstructive pulmonary disease with (acute) exacerbation: Secondary | ICD-10-CM | POA: Diagnosis not present

## 2016-11-18 DIAGNOSIS — R0602 Shortness of breath: Secondary | ICD-10-CM | POA: Diagnosis not present

## 2016-11-18 DIAGNOSIS — L89142 Pressure ulcer of left lower back, stage 2: Secondary | ICD-10-CM | POA: Diagnosis not present

## 2016-11-18 DIAGNOSIS — I509 Heart failure, unspecified: Secondary | ICD-10-CM | POA: Diagnosis not present

## 2016-11-18 DIAGNOSIS — R609 Edema, unspecified: Secondary | ICD-10-CM | POA: Diagnosis not present

## 2016-11-19 DIAGNOSIS — I5033 Acute on chronic diastolic (congestive) heart failure: Secondary | ICD-10-CM | POA: Diagnosis not present

## 2016-11-19 DIAGNOSIS — J9621 Acute and chronic respiratory failure with hypoxia: Secondary | ICD-10-CM | POA: Diagnosis not present

## 2016-11-19 DIAGNOSIS — I480 Paroxysmal atrial fibrillation: Secondary | ICD-10-CM | POA: Diagnosis not present

## 2016-11-25 DIAGNOSIS — L89142 Pressure ulcer of left lower back, stage 2: Secondary | ICD-10-CM | POA: Diagnosis not present

## 2016-11-29 DIAGNOSIS — N184 Chronic kidney disease, stage 4 (severe): Secondary | ICD-10-CM | POA: Diagnosis not present

## 2016-11-29 DIAGNOSIS — I509 Heart failure, unspecified: Secondary | ICD-10-CM | POA: Diagnosis not present

## 2016-11-29 DIAGNOSIS — R609 Edema, unspecified: Secondary | ICD-10-CM | POA: Diagnosis not present

## 2016-12-02 DIAGNOSIS — N183 Chronic kidney disease, stage 3 (moderate): Secondary | ICD-10-CM | POA: Diagnosis not present

## 2016-12-02 DIAGNOSIS — R5381 Other malaise: Secondary | ICD-10-CM | POA: Diagnosis not present

## 2016-12-02 DIAGNOSIS — I509 Heart failure, unspecified: Secondary | ICD-10-CM | POA: Diagnosis not present

## 2016-12-02 DIAGNOSIS — R262 Difficulty in walking, not elsewhere classified: Secondary | ICD-10-CM | POA: Diagnosis not present

## 2016-12-02 DIAGNOSIS — M25561 Pain in right knee: Secondary | ICD-10-CM | POA: Diagnosis not present

## 2016-12-02 DIAGNOSIS — R0602 Shortness of breath: Secondary | ICD-10-CM | POA: Diagnosis not present

## 2016-12-02 DIAGNOSIS — R609 Edema, unspecified: Secondary | ICD-10-CM | POA: Diagnosis not present

## 2016-12-02 DIAGNOSIS — M6281 Muscle weakness (generalized): Secondary | ICD-10-CM | POA: Diagnosis not present

## 2016-12-03 DIAGNOSIS — N183 Chronic kidney disease, stage 3 (moderate): Secondary | ICD-10-CM | POA: Diagnosis not present

## 2016-12-03 DIAGNOSIS — I509 Heart failure, unspecified: Secondary | ICD-10-CM | POA: Diagnosis not present

## 2016-12-03 DIAGNOSIS — R609 Edema, unspecified: Secondary | ICD-10-CM | POA: Diagnosis not present

## 2016-12-06 DIAGNOSIS — R0602 Shortness of breath: Secondary | ICD-10-CM | POA: Diagnosis not present

## 2016-12-06 DIAGNOSIS — I509 Heart failure, unspecified: Secondary | ICD-10-CM | POA: Diagnosis not present

## 2016-12-06 DIAGNOSIS — R609 Edema, unspecified: Secondary | ICD-10-CM | POA: Diagnosis not present

## 2016-12-07 DIAGNOSIS — M6281 Muscle weakness (generalized): Secondary | ICD-10-CM | POA: Diagnosis not present

## 2016-12-07 DIAGNOSIS — R5381 Other malaise: Secondary | ICD-10-CM | POA: Diagnosis not present

## 2016-12-07 DIAGNOSIS — R262 Difficulty in walking, not elsewhere classified: Secondary | ICD-10-CM | POA: Diagnosis not present

## 2016-12-07 DIAGNOSIS — M25561 Pain in right knee: Secondary | ICD-10-CM | POA: Diagnosis not present

## 2016-12-08 DIAGNOSIS — N183 Chronic kidney disease, stage 3 (moderate): Secondary | ICD-10-CM | POA: Diagnosis not present

## 2016-12-08 DIAGNOSIS — I509 Heart failure, unspecified: Secondary | ICD-10-CM | POA: Diagnosis not present

## 2016-12-08 DIAGNOSIS — I10 Essential (primary) hypertension: Secondary | ICD-10-CM | POA: Diagnosis not present

## 2016-12-08 DIAGNOSIS — R0602 Shortness of breath: Secondary | ICD-10-CM | POA: Diagnosis not present

## 2016-12-08 DIAGNOSIS — R609 Edema, unspecified: Secondary | ICD-10-CM | POA: Diagnosis not present

## 2016-12-09 DIAGNOSIS — I509 Heart failure, unspecified: Secondary | ICD-10-CM | POA: Diagnosis not present

## 2016-12-09 DIAGNOSIS — E876 Hypokalemia: Secondary | ICD-10-CM | POA: Diagnosis not present

## 2016-12-09 DIAGNOSIS — R609 Edema, unspecified: Secondary | ICD-10-CM | POA: Diagnosis not present

## 2016-12-09 DIAGNOSIS — N183 Chronic kidney disease, stage 3 (moderate): Secondary | ICD-10-CM | POA: Diagnosis not present

## 2016-12-13 DIAGNOSIS — I509 Heart failure, unspecified: Secondary | ICD-10-CM | POA: Diagnosis not present

## 2016-12-13 DIAGNOSIS — R609 Edema, unspecified: Secondary | ICD-10-CM | POA: Diagnosis not present

## 2016-12-13 DIAGNOSIS — N183 Chronic kidney disease, stage 3 (moderate): Secondary | ICD-10-CM | POA: Diagnosis not present

## 2016-12-14 DIAGNOSIS — M109 Gout, unspecified: Secondary | ICD-10-CM | POA: Diagnosis not present

## 2016-12-14 DIAGNOSIS — I509 Heart failure, unspecified: Secondary | ICD-10-CM | POA: Diagnosis not present

## 2016-12-14 DIAGNOSIS — R062 Wheezing: Secondary | ICD-10-CM | POA: Diagnosis not present

## 2016-12-14 DIAGNOSIS — R069 Unspecified abnormalities of breathing: Secondary | ICD-10-CM | POA: Diagnosis not present

## 2016-12-15 ENCOUNTER — Ambulatory Visit (INDEPENDENT_AMBULATORY_CARE_PROVIDER_SITE_OTHER): Payer: Medicare Other | Admitting: Cardiovascular Disease

## 2016-12-15 ENCOUNTER — Encounter: Payer: Self-pay | Admitting: Cardiovascular Disease

## 2016-12-15 VITALS — BP 102/88 | HR 108 | Ht 67.0 in | Wt >= 6400 oz

## 2016-12-15 DIAGNOSIS — I480 Paroxysmal atrial fibrillation: Secondary | ICD-10-CM

## 2016-12-15 MED ORDER — METOLAZONE 2.5 MG PO TABS: 2.5000 mg | ORAL_TABLET | Freq: Every day | ORAL | 1 refills | Status: DC

## 2016-12-15 NOTE — Patient Instructions (Signed)
Medication Instructions: Increase Lasix to 80 mg IV twice daily.  START Zaroxolyn 2.5 mg--take 30 minutes prior to IV lasix dose every morning for three days.   Labwork: Your physician recommends that you return for lab work on Monday, 12/20/16--BMET   Follow-Up: Your physician recommends that you schedule a follow-up appointment next week with a PA and in 3 months with Dr. Gwenlyn Found.

## 2016-12-15 NOTE — Assessment & Plan Note (Signed)
History of hypertension blood pressure measured today of 102/88. She is on carvedilol, and diltiazem. Continue current meds at current dosing

## 2016-12-15 NOTE — Assessment & Plan Note (Signed)
History of PAF currently in atrial fibrillation on Coumadin anticoagulation as well as diltiazem for rate control.

## 2016-12-15 NOTE — Assessment & Plan Note (Signed)
Ms. Fabio Neighbors was recently admitted to [facility] 10/30/16 with volume overload and PAF. She was discharged home approximately 5 days later. She was treated with high-dose diuretics. Her discharge weight was 370. Her weight today is 403. She does have a PICC line in for IV diuretics. She is currently residing in a rehabilitation facility because of a fractured foot. She does have moderate renal insufficiency with a serum creatinine in the 2 range. I do not think her current diuretic dose is sufficient. I'm going to increase her Lasix to 80 mg IV twice a day and add Zaroxolyn every other day. We'll check check a basic metabolic panel on Monday and have her see mid-level provider back next week.

## 2016-12-15 NOTE — Progress Notes (Signed)
12/15/2016 Paige Marshall   [birth year]  [id number]  Primary Physician EDWARDS, Milford Cage, NP Primary Cardiologist: Lorretta Harp MD Renae Gloss  HPI:   The patient is a 61 year old morbidly overweight, widowed [place] American female, mother of 2, grandmother to 3 grandchildren, who I last saw in the office 05/07/16 . She work third shift as a Animal nutritionist at [employer] but a sense out of work because of inability to ambulate.. Her other problems include hypertension and discontinued tobacco abuse 1 year ago, as well as paroxysmal atrial fibrillation, maintaining sinus rhythm on Rythmol. She has obstructive sleep apnea, on CPAP. She has complained of progressive dyspnea and chest pain as well as lower extremity swelling. She was catheterized November 04, 2005, revealing normal coronary arteries and low-normal LV function, suggesting nonischemic cardiomyopathy secondary to uncontrolled hypertension. I began her on Zaroxolyn 2.5 mg daily every day, which resulted in diuresis and improvement in her symptoms of shortness of breath and swelling. A 2D echocardiogram revealed an EF of 45%, mildly decreased compared to prior echocardiogram, but consistent with her LV function by angiography in 2007. She is aware of salt restriction She was admitted to [facility] April of this year because of systolic and diastolic heart failure. I performed cardiac catheterization on her 01/20/15 once again revealing normal coronary arteries with an EF of 45% elevated LVEDP. She is in need of bilateral knee replacements but has not pursued this because of her weight. She was recently hospitalized with volume overload and PAF on 10/30/16. She will was on Coumadin anticoagulation. She was diuresed for several days with discharge weight of 370. She also recently fractured her foot and is in a rehabilitation facility. She has a PICC line in and is getting IV Lasix although her weight today is 403 with  diffuse lower extremity edema and anasarca.  Current Outpatient Prescriptions  Medication Sig Dispense Refill  . bisacodyl (DULCOLAX) 10 MG suppository Place 10 mg rectally daily as needed (constipation if no results from MOM).    . bisacodyl (DULCOLAX) 5 MG EC tablet Take 1 tablet (5 mg total) by mouth daily as needed for moderate constipation. 30 tablet 0  . carvedilol (COREG) 6.25 MG tablet Take 1 tablet (6.25 mg total) by mouth 2 (two) times daily with a meal.    . diclofenac sodium (VOLTAREN) 1 % GEL Apply 4 g topically every 6 (six) hours as needed (pain).     [name] diltiazem (DILACOR XR) 180 MG 24 hr capsule Take 180 mg by mouth daily.    [name] docusate sodium (COLACE) 100 MG capsule Take 1 capsule (100 mg total) by mouth 2 (two) times daily. 10 capsule 0  . ferrous sulfate 325 (65 FE) MG tablet Take 1 tablet (325 mg total) by mouth 3 (three) times daily with meals.  3  . furosemide (LASIX) 40 MG tablet Take 1 tablet (40 mg total) by mouth 2 (two) times daily. 60 tablet 0  . Hypromellose (ARTIFICIAL TEARS) 0.4 % SOLN Place 1 drop into both eyes every 6 (six) hours as needed (dry eyes).    . Multiple Vitamin (MULTIVITAMIN WITH MINERALS) TABS tablet Take 1 tablet by mouth daily.    [name] omega-3 acid ethyl esters (LOVAZA) 1 g capsule Take 1 g by mouth 2 (two) times daily.    [name] oseltamivir (TAMIFLU) 75 MG capsule Take 75 mg by mouth daily. Order date: 11/03/16 - give until 11/14/16    . oxyCODONE (OXY  IR/ROXICODONE) 5 MG immediate release tablet Take 1 tablet (5 mg total) by mouth every 6 (six) hours as needed for breakthrough pain. 15 tablet 0  . OXYGEN Inhale 3 L into the lungs continuous.    . polyethylene glycol (MIRALAX / GLYCOLAX) packet Take 17 g by mouth daily as needed. (Patient taking differently: Take 17 g by mouth daily as needed (constipation). Mix in 8 oz liquid and drink) 14 each 0  . predniSONE (DELTASONE) 10 MG tablet Take 40 mg tablet 11/14/2016 and taper down by 10 mg daily until completed  15 tablet 0  . PRESCRIPTION MEDICATION Inhale into the lungs at bedtime. BIPAP    . PROAIR HFA 108 (90 Base) MCG/ACT inhaler INHALE 2 PUFFS EVERY 6 HOURS AS NEEDED FOR WHEEZING OR SHORTNESS OF BREATH. 8.5 Inhaler 1  . promethazine (PHENERGAN) 25 MG tablet Take 25 mg by mouth every 6 (six) hours as needed for nausea or vomiting.    . propafenone (RYTHMOL) 150 MG tablet Take 1 tablet (150 mg total) by mouth every 8 (eight) hours. 10 tablet 0  . senna-docusate (SENNA S) 8.6-50 MG tablet Take 2 tablets by mouth See admin instructions. Take 2 tablet by mouth daily at bedtime, may also take 2 tablets as needed for constipation     No current facility-administered medications for this visit.     Allergies  Allergen Reactions  . Penicillins Hives, Itching, Swelling and Other (See Comments)    Tongue swelling Has patient had a PCN reaction causing immediate rash, facial/tongue/throat swelling, SOB or lightheadedness with hypotension: Yes  Clarified with the patient her penicillin allergy. Pt has tolerated cefepime in the past. Pt also states that she can take amoxicillin.    . Citrus Hives    Social History   Social History  . Marital status: Single    Spouse name: N/A  . Number of children: N/A  . Years of education: N/A   Occupational History  . Not on file.   Social History Main Topics  . Smoking status: Former Smoker    Packs/day: 0.33    Years: 35.00    Types: Cigarettes    Quit date: 10/11/2008  . Smokeless tobacco: Never Used  . Alcohol use No     Comment: 01/14/2015 "~ 4, 24oz cans beer/wk"  . Drug use: No  . Sexual activity: No   Other Topics Concern  . Not on file   Social History Narrative  . No narrative on file     Review of Systems: General: negative for chills, fever, night sweats or weight changes.  Cardiovascular: negative for chest pain, dyspnea on exertion, edema, orthopnea, palpitations, paroxysmal nocturnal dyspnea or shortness of breath Dermatological:  negative for rash Respiratory: negative for cough or wheezing Urologic: negative for hematuria Abdominal: negative for nausea, vomiting, diarrhea, bright red blood per rectum, melena, or hematemesis Neurologic: negative for visual changes, syncope, or dizziness All other systems reviewed and are otherwise negative except as noted above.    Blood pressure 102/88, pulse (!) 108, height 5\' 7"  (1.702 m), weight (!) 403 lb (182.8 kg), last menstrual period 09/07/2011.  General appearance: alert and no distress Neck: no adenopathy, no carotid bruit, no JVD, supple, symmetrical, trachea midline and thyroid not enlarged, symmetric, no tenderness/mass/nodules Lungs: clear to auscultation bilaterally Heart: irregularly irregular rhythm Extremities: 3+ bilateral lower extremity edema  EKG atrial fibrillation with a ventricular response of 108 and a nonspecific IVCD with left axis deviation. The QRS morphology is similar to  her EKG performed in January during her hospitalization. I personally reviewed his EKG.  ASSESSMENT AND PLAN:   Dyslipidemia History of dyslipidemia not on statin therapy followed by her PCP  PAF (paroxysmal atrial fibrillation) ([place]) History of PAF currently in atrial fibrillation on Coumadin anticoagulation as well as diltiazem for rate control.  Essential hypertension History of hypertension blood pressure measured today of 102/88. She is on carvedilol, and diltiazem. Continue current meds at current dosing  Acute on chronic diastolic CHF (congestive heart failure) [facility]) Ms. Fabio Neighbors was recently admitted to [facility] 10/30/16 with volume overload and PAF. She was discharged home approximately 5 days later. She was treated with high-dose diuretics. Her discharge weight was 370. Her weight today is 403. She does have a PICC line in for IV diuretics. She is currently residing in a rehabilitation facility because of a fractured foot. She does have moderate renal  insufficiency with a serum creatinine in the 2 range. I do not think her current diuretic dose is sufficient. I'm going to increase her Lasix to 80 mg IV twice a day and add Zaroxolyn every other day. We'll check check a basic metabolic panel on Monday and have her see mid-level provider back next week.      Lorretta Harp MD FACP,FACC,FAHA, [facility] 12/15/2016 11:31 AM

## 2016-12-15 NOTE — Assessment & Plan Note (Signed)
History of dyslipidemia not on statin therapy followed by her PCP 

## 2016-12-17 ENCOUNTER — Ambulatory Visit: Payer: Medicare Other | Admitting: Cardiovascular Disease

## 2016-12-17 DIAGNOSIS — R0602 Shortness of breath: Secondary | ICD-10-CM | POA: Diagnosis not present

## 2016-12-17 DIAGNOSIS — I509 Heart failure, unspecified: Secondary | ICD-10-CM | POA: Diagnosis not present

## 2016-12-17 DIAGNOSIS — N183 Chronic kidney disease, stage 3 (moderate): Secondary | ICD-10-CM | POA: Diagnosis not present

## 2016-12-17 DIAGNOSIS — R609 Edema, unspecified: Secondary | ICD-10-CM | POA: Diagnosis not present

## 2016-12-21 DIAGNOSIS — E876 Hypokalemia: Secondary | ICD-10-CM | POA: Diagnosis not present

## 2016-12-21 DIAGNOSIS — R609 Edema, unspecified: Secondary | ICD-10-CM | POA: Diagnosis not present

## 2016-12-21 DIAGNOSIS — N183 Chronic kidney disease, stage 3 (moderate): Secondary | ICD-10-CM | POA: Diagnosis not present

## 2016-12-21 DIAGNOSIS — I509 Heart failure, unspecified: Secondary | ICD-10-CM | POA: Diagnosis not present

## 2016-12-21 DIAGNOSIS — M069 Rheumatoid arthritis, unspecified: Secondary | ICD-10-CM | POA: Diagnosis not present

## 2016-12-22 ENCOUNTER — Encounter: Payer: Self-pay | Admitting: Physician Assistant

## 2016-12-22 ENCOUNTER — Ambulatory Visit (INDEPENDENT_AMBULATORY_CARE_PROVIDER_SITE_OTHER): Payer: Medicare Other | Admitting: Physician Assistant

## 2016-12-22 VITALS — Ht 67.0 in | Wt 392.0 lb

## 2016-12-22 DIAGNOSIS — N183 Chronic kidney disease, stage 3 unspecified: Secondary | ICD-10-CM

## 2016-12-22 DIAGNOSIS — R609 Edema, unspecified: Secondary | ICD-10-CM | POA: Diagnosis not present

## 2016-12-22 DIAGNOSIS — G4733 Obstructive sleep apnea (adult) (pediatric): Secondary | ICD-10-CM | POA: Diagnosis not present

## 2016-12-22 DIAGNOSIS — I480 Paroxysmal atrial fibrillation: Secondary | ICD-10-CM

## 2016-12-22 DIAGNOSIS — Z9989 Dependence on other enabling machines and devices: Secondary | ICD-10-CM | POA: Diagnosis not present

## 2016-12-22 DIAGNOSIS — E876 Hypokalemia: Secondary | ICD-10-CM | POA: Diagnosis not present

## 2016-12-22 DIAGNOSIS — I5033 Acute on chronic diastolic (congestive) heart failure: Secondary | ICD-10-CM | POA: Diagnosis not present

## 2016-12-22 DIAGNOSIS — D649 Anemia, unspecified: Secondary | ICD-10-CM | POA: Diagnosis not present

## 2016-12-22 NOTE — Progress Notes (Signed)
Cardiology Office Note    Date:  12/22/2016   ID:  Paige Marshall, DOB 03-08-1956, MRN [id number]  PCP:  Kerin Perna, NP  Cardiologist:  Dr. Gwenlyn Found  Chief Complaint  Patient presents with  . Follow-up    seen for Dr. Gwenlyn Found, 1 week visit after diuresis.    History of Present Illness:  Paige Marshall is a 61 y.o. female with PMH of HTN, h/o tobacco abuse, PAF on coumadin and rythmol, OSA on CPAP, COPD, stage III CKD, NICM, CVA and morbid obesity. She had progressive dyspnea and the chest discomfort and underwent cardiac catheterization on 11/04/2005 revealing normal coronaries, low normal LV function suggestive of nonischemic cardiomyopathy secondary to uncontrolled hypertension. She also has signs of fluid overload, Zaroxolyn was added to her medical regiment which resulted in diuresis and improvement of her shortness of breath and swelling. 2-D echocardiogram at that time revealed EF 45%, mildly decreased compared to the previous echo. Most recent echocardiogram obtained in 2017 showed EF has improved to 50-55%. She is aware of salt restriction and has been previously admitted due to acute combined systolic and diastolic heart failure. Her last cardiac catheterization was on 01/20/2015 which again revealed normal coronaries, EF 45% and elevated LVEDP. She has had 4 admissions since November 2017. Her most recent admission in January was for acute on chronic heart failure and PAF. He was diuresed for several days, her discharge weight was 370 pounds. She also has recent fracture of her foot and was discharged to rehabilitation facility. She has a PICC line and gets cath IV Lasix.  She was seen for post hospital follow-up on 12/15/2016 by Dr. Gwenlyn Found, her weight has increased to 403 pounds with diffuse lower extremity edema and anasarca. Her IV Lasix was changed to 80 mg twice a day and the Zaroxolyn added every other day. He presents back today for follow-up. (Review of the recent  discharge summary it appears patient was on 60 mg BID IV Lasix before trending down to 40 mg twice a day of IV Lasix, she was discharged on 40 mg twice a day with PO lasix.  She presents today for follow-up, she still have at least 2+ pitting edema in bilateral lower extremity. Her IV Lasix has discontinued on 12/20/2016, since yesterday, she has been transitioned to 80 mg twice a day of oral Lasix. She is also on 2.5 mg daily of metolazone. She says she does not have any other use for her PICC line. As long as her weight continued to decrease, I think it is reasonable to remove the PICC line before she goes home. The reason she was transitioned to oral Lasix is because her renal function declined. We did request a record from rehabilitation facility today it appears her creatinine went up to 2.4 on recent lab. Her INR was 2.2 on 12/15/2016. I was unable to obtain a blood pressure both manually or with automatic BP cuff. I attempted 3 times myself. However patient was very cheerful and she denies any symptom today. Her lung shows diminished breath sound however no obvious fluid. I do not think she has significant pulmonary edema. Most of the fluid is in her lower extremities. I recommended continue on the current diuretic along with daily metolazone. She will need a basic metabolic panel either this Sunday or Monday and have the results forwarded to [place]. She expected to be discharged from the rehabilitation facility next Monday. I am concerned about volume overload, I will see her  back in one week.     Past Medical History:  Diagnosis Date  . Arthritis    "both knees" (01/14/2015)  . Asthma   . Chronic combined systolic and diastolic CHF (congestive heart failure) ([place])   . CKD (chronic kidney disease), stage III   . COPD (chronic obstructive pulmonary disease) ([place])   . Degenerative joint disease of both lower legs   . Edema    Lower extremity  . Former smoker   . Gout    hx in "both knees" (01/14/2015)    . H. pylori infection 01/03/2014   +breath test.    . High cholesterol   . Hypertension   . Morbid obesity ([place])   . NICM (nonischemic cardiomyopathy) ([place])    a. LHC 01/2015: normal cors, EF 45-50%. b. EF 50-55% in 08/2016.  [name] Nonischemic cardiomyopathy ([place])   . Normal coronary arteries   . OSA (obstructive sleep apnea)    "never sent CPAP out" (01/14/2015)  . Paroxysmal atrial fibrillation ([facility])   . Pneumonia X 2  . Pulmonary hypertension   . Respiratory failure ([place])   . Rheumatoid arthritis ([place])   . Stroke [facility])     Past Surgical History:  Procedure Laterality Date  . ANGIOGRAM/LV (CONGENITAL)  2007  . BREATH TEK H PYLORI N/A 12/31/2013   Procedure: [place];  Surgeon: Gayland Curry, MD;  Location: [facility];  Service: General;  Laterality: N/A;  . CORNEAL TRANSPLANT Right ~ 2010  . CORONARY ANGIOPLASTY WITH STENT PLACEMENT  11/04/2005   "1"  . DOPPLER ECHOCARDIOGRAPHY     2 D   EF of 45%  . EYE SURGERY    . LEFT HEART CATHETERIZATION WITH CORONARY ANGIOGRAM N/A 01/20/2015   Procedure: LEFT HEART CATHETERIZATION WITH CORONARY ANGIOGRAM;  Surgeon: Lorretta Harp, MD;  Location: [facility] CATH LAB;  Service: Cardiovascular;  Laterality: N/A;  . sleep study  2011  . stress myocardial dipyridamole perfusion    . TUBAL LIGATION  1982  . venous duplex ultrasound  2013    Current Medications: Outpatient Medications Prior to Visit  Medication Sig Dispense Refill  . bisacodyl (DULCOLAX) 10 MG suppository Place 10 mg rectally daily as needed (constipation if no results from MOM).    . bisacodyl (DULCOLAX) 5 MG EC tablet Take 1 tablet (5 mg total) by mouth daily as needed for moderate constipation. 30 tablet 0  . carvedilol (COREG) 6.25 MG tablet Take 1 tablet (6.25 mg total) by mouth 2 (two) times daily with a meal.    . diclofenac sodium (VOLTAREN) 1 % GEL Apply 4 g topically every 6 (six) hours as needed (pain).     [name] diltiazem (DILACOR XR) 180 MG 24 hr capsule Take 180  mg by mouth daily.    [name] docusate sodium (COLACE) 100 MG capsule Take 1 capsule (100 mg total) by mouth 2 (two) times daily. 10 capsule 0  . ferrous sulfate 325 (65 FE) MG tablet Take 1 tablet (325 mg total) by mouth 3 (three) times daily with meals.  3  . furosemide (LASIX) 40 MG tablet Take 1 tablet (40 mg total) by mouth 2 (two) times daily. (Patient taking differently: Take 80 mg by mouth 2 (two) times daily. ) 60 tablet 0  . Hypromellose (ARTIFICIAL TEARS) 0.4 % SOLN Place 1 drop into both eyes every 6 (six) hours as needed (dry eyes).    . metolazone (ZAROXOLYN) 2.5 MG tablet Take 1 tablet (2.5 mg total) by  mouth daily. 90 tablet 1  . Multiple Vitamin (MULTIVITAMIN WITH MINERALS) TABS tablet Take 1 tablet by mouth daily.    [name] omega-3 acid ethyl esters (LOVAZA) 1 g capsule Take 1 g by mouth 2 (two) times daily.    [name] oxyCODONE (OXY IR/ROXICODONE) 5 MG immediate release tablet Take 1 tablet (5 mg total) by mouth every 6 (six) hours as needed for breakthrough pain. 15 tablet 0  . OXYGEN Inhale 3 L into the lungs continuous.    . polyethylene glycol (MIRALAX / GLYCOLAX) packet Take 17 g by mouth daily as needed. (Patient taking differently: Take 17 g by mouth daily as needed (constipation). Mix in 8 oz liquid and drink) 14 each 0  . predniSONE (DELTASONE) 10 MG tablet Take 40 mg tablet 11/14/2016 and taper down by 10 mg daily until completed 15 tablet 0  . PRESCRIPTION MEDICATION Inhale into the lungs at bedtime. BIPAP    . PROAIR HFA 108 (90 Base) MCG/ACT inhaler INHALE 2 PUFFS EVERY 6 HOURS AS NEEDED FOR WHEEZING OR SHORTNESS OF BREATH. 8.5 Inhaler 1  . promethazine (PHENERGAN) 25 MG tablet Take 25 mg by mouth every 6 (six) hours as needed for nausea or vomiting.    . propafenone (RYTHMOL) 150 MG tablet Take 1 tablet (150 mg total) by mouth every 8 (eight) hours. 10 tablet 0  . senna-docusate (SENNA S) 8.6-50 MG tablet Take 2 tablets by mouth See admin instructions. Take 2 tablet by mouth daily at  bedtime, may also take 2 tablets as needed for constipation    . oseltamivir (TAMIFLU) 75 MG capsule Take 75 mg by mouth daily. Order date: 11/03/16 - give until 11/14/16     No facility-administered medications prior to visit.      Allergies:   Penicillins and Citrus   Social History   Social History  . Marital status: Single    Spouse name: N/A  . Number of children: N/A  . Years of education: N/A   Social History Main Topics  . Smoking status: Former Smoker    Packs/day: 0.33    Years: 35.00    Types: Cigarettes    Quit date: 10/11/2008  . Smokeless tobacco: Never Used  . Alcohol use No     Comment: 01/14/2015 "~ 4, 24oz cans beer/wk"  . Drug use: No  . Sexual activity: No   Other Topics Concern  . None   Social History Narrative  . None     Family History:  The patient's family history includes Cancer in her father; Heart disease in her mother; Hypertension in her other.   ROS:   Please see the history of present illness.    ROS All other systems reviewed and are negative.   PHYSICAL EXAM:   VS:  Ht 5\' 7"  (1.702 m)   Wt (!) 392 lb (177.8 kg)   LMP 09/07/2011   BMI 61.40 kg/m    GEN: Well nourished, well developed, in no acute distress  HEENT: normal  Neck: no JVD, carotid bruits, or masses Cardiac: Irregularly irregular; no murmurs, rubs, or gallops. 2+ edema  Respiratory:  clear to auscultation bilaterally, normal work of breathing GI: soft, nontender, nondistended, + BS MS: no deformity or atrophy  Skin: warm and dry, no rash Neuro:  Alert and Oriented x 3, Strength and sensation are intact Psych: euthymic mood, full affect  Wt Readings from Last 3 Encounters:  12/22/16 (!) 392 lb (177.8 kg)  12/15/16 (!) 403 lb (182.8 kg)  11/13/16 (!) 370 lb 3.2 oz (167.9 kg)      Studies/Labs Reviewed:   EKG:  EKG is not ordered today.    Recent Labs: 09/22/2016: ALT 95 09/23/2016: Magnesium 2.4 11/07/2016: B Natriuretic Peptide 490.4 11/13/2016: BUN 58;  Creatinine, Ser 1.97; Hemoglobin 8.8; Platelets 224; Potassium 4.1; Sodium 142   Lipid Panel    Component Value Date/Time   CHOL 167 01/15/2015 0346   TRIG 194 (H) 01/15/2015 0346   HDL 35 (L) 01/15/2015 0346   CHOLHDL 4.8 01/15/2015 0346   VLDL 39 01/15/2015 0346   LDLCALC 93 01/15/2015 0346    Additional studies/ records that were reviewed today include:   Cath 01/20/2015 ANGIOGRAPHIC RESULTS:   1. Left main; normal  2. LAD; normal 3. Left circumflex; dominant and normal.  4. Right coronary artery; nondominant and normal 5. Left ventriculography; RAO left ventriculogram was performed using  25 mL of Visipaque dye at 12 mL/second. The overall LVEF estimated  45-50 % Without wall motion abnormalities  IMPRESSION:Paige Marshall has normal coronary arteries and low normal LV function. I believe her congestive heart failure is related to diastolic dysfunction. Her LVEDP was mildly elevated. The sheath was removed and a TR band was placed on the right wrist to achieve patent hemostasis. The patient left the lab is stable condition. She'll be gently hydrated and most likely discharged home later today as per her progress note.    ASSESSMENT:    1. Acute on chronic diastolic heart failure ([place])   2. PAF (paroxysmal atrial fibrillation) ([place])   3. OSA on CPAP   4. CKD (chronic kidney disease), stage III   5. Morbid obesity ([place])   6. Anemia, unspecified type      PLAN:  In order of problems listed above:  1. Acute on chronic diastolic heart failure: She was placed on 80 mg twice a day of IV Lasix a week ago by Dr. Gwenlyn Found through her PICC line, this has been discontinued by inpatient rehabilitation as her renal function deteriorated and her creatinine went up to 2.4. Since then, she has been transitioned to 80 mg twice a day of oral Lasix. Her lower extremity edema has significantly improved, however continued to have 2+ pitting edema regardless. I recommended continue on the  current dose of Lasix and 2.5 mg daily of metolazone. I will see her in one week for reevaluation. She is no longer using her PICC line, I think it is a source of potential infection, I'm okay with discontinuation of PICC line as long as her weight is decreasing prior to release.  2. PAF: On Coumadin  - Unable to assess heart rate and blood pressure today. I attempted manual blood pressure 3 times without success. She will return for follow-up next week, I plan to obtain a EKG next week to assess her baseline heart rate. She likely has persistent atrial fibrillation by this point.  3. Obstructive sleep apnea on CPAP: Continue current therapy  4. CKD stage III: She has some sign of renal deterioration after recent IV Lasix. She is currently on by mouth Lasix 80 mg twice a day with metolazone. We may have to start cutting back on metolazone if her renal function deteriorated. She will have a basic metabolic panel obtained either this Sunday on next Monday facility. She [facility] in [facility]  5. Chronic anemia: It appears since 2016, her hemoglobin has been ranging in the 7-8 area, this will need to be closely monitored  as she is on Coumadin. I told her she should not be taking aspirin at the same time.  6. Morbid obesity: This is the main issue which likely contributed to diastolic dysfunction.    Medication Adjustments/Labs and Tests Ordered: Current medicines are reviewed at length with the patient today.  Concerns regarding medicines are outlined above.  Medication changes, Labs and Tests ordered today are listed in the Patient Instructions below. Patient Instructions  Medication Instructions:  Your physician recommends that you continue on your current medications as directed. Please refer to the Current Medication list given to you today.  If you need a refill on your cardiac medications before your next appointment, please call your pharmacy.  Labwork: BMP Sunday  OR Monday AT FACILITY  Follow-Up: PLEASE SCHEDULE 12-29-16 @ 1130 AM-OK PER Trapper Meech PLEASE MONITOR BLOOD PRESSURE AND BRING TO NEXT VISIT   Thank you for choosing [facility]!!    Larence Thone, PA-C [place], LPN     Hilbert Corrigan, [place]  12/22/2016 4:35 PM    [facility] [address], [place], [place]  [address] Phone: [phone]; Fax: [phone]

## 2016-12-22 NOTE — Patient Instructions (Signed)
Medication Instructions:  Your physician recommends that you continue on your current medications as directed. Please refer to the Current Medication list given to you today.  If you need a refill on your cardiac medications before your next appointment, please call your pharmacy.  Labwork: BMP Sunday OR Monday AT FACILITY  Follow-Up: PLEASE SCHEDULE 12-29-16 @ 1130 AM-OK PER [place] PLEASE MONITOR BLOOD PRESSURE AND BRING TO NEXT VISIT   Thank you for choosing [facility] at [place]!!    HAO MENG, PA-C [place], LPN

## 2016-12-23 DIAGNOSIS — M069 Rheumatoid arthritis, unspecified: Secondary | ICD-10-CM | POA: Diagnosis not present

## 2016-12-23 DIAGNOSIS — R609 Edema, unspecified: Secondary | ICD-10-CM | POA: Diagnosis not present

## 2016-12-24 DIAGNOSIS — R0602 Shortness of breath: Secondary | ICD-10-CM | POA: Diagnosis not present

## 2016-12-24 DIAGNOSIS — R609 Edema, unspecified: Secondary | ICD-10-CM | POA: Diagnosis not present

## 2016-12-24 DIAGNOSIS — N183 Chronic kidney disease, stage 3 (moderate): Secondary | ICD-10-CM | POA: Diagnosis not present

## 2016-12-24 DIAGNOSIS — M1990 Unspecified osteoarthritis, unspecified site: Secondary | ICD-10-CM | POA: Diagnosis not present

## 2016-12-24 DIAGNOSIS — I509 Heart failure, unspecified: Secondary | ICD-10-CM | POA: Diagnosis not present

## 2016-12-26 ENCOUNTER — Encounter (HOSPITAL_COMMUNITY): Payer: Self-pay | Admitting: Emergency Medicine

## 2016-12-26 ENCOUNTER — Telehealth: Payer: Self-pay | Admitting: Physician Assistant

## 2016-12-26 ENCOUNTER — Emergency Department (HOSPITAL_COMMUNITY): Payer: Medicare Other

## 2016-12-26 ENCOUNTER — Inpatient Hospital Stay (HOSPITAL_COMMUNITY): Payer: Medicare Other

## 2016-12-26 ENCOUNTER — Inpatient Hospital Stay (HOSPITAL_COMMUNITY)
Admission: EM | Admit: 2016-12-26 | Discharge: 2017-02-10 | DRG: 264 | Disposition: A | Discharge status: Other Acute Inpatient Hospital | Payer: Medicare Other | Attending: Student in an Organized Health Care Education/Training Program | Admitting: Student in an Organized Health Care Education/Training Program

## 2016-12-26 DIAGNOSIS — D75829 Heparin-induced thrombocytopenia, unspecified: Secondary | ICD-10-CM

## 2016-12-26 DIAGNOSIS — J9690 Respiratory failure, unspecified, unspecified whether with hypoxia or hypercapnia: Secondary | ICD-10-CM | POA: Diagnosis not present

## 2016-12-26 DIAGNOSIS — R7881 Bacteremia: Secondary | ICD-10-CM | POA: Diagnosis present

## 2016-12-26 DIAGNOSIS — E875 Hyperkalemia: Secondary | ICD-10-CM | POA: Diagnosis present

## 2016-12-26 DIAGNOSIS — Z800 Family history of malignant neoplasm of digestive organs: Secondary | ICD-10-CM

## 2016-12-26 DIAGNOSIS — I959 Hypotension, unspecified: Secondary | ICD-10-CM | POA: Diagnosis not present

## 2016-12-26 DIAGNOSIS — I4891 Unspecified atrial fibrillation: Secondary | ICD-10-CM | POA: Diagnosis not present

## 2016-12-26 DIAGNOSIS — J449 Chronic obstructive pulmonary disease, unspecified: Secondary | ICD-10-CM | POA: Diagnosis present

## 2016-12-26 DIAGNOSIS — I2721 Secondary pulmonary arterial hypertension: Secondary | ICD-10-CM | POA: Diagnosis present

## 2016-12-26 DIAGNOSIS — J9611 Chronic respiratory failure with hypoxia: Secondary | ICD-10-CM | POA: Diagnosis not present

## 2016-12-26 DIAGNOSIS — Z9911 Dependence on respirator [ventilator] status: Secondary | ICD-10-CM

## 2016-12-26 DIAGNOSIS — G4733 Obstructive sleep apnea (adult) (pediatric): Secondary | ICD-10-CM | POA: Diagnosis present

## 2016-12-26 DIAGNOSIS — E2749 Other adrenocortical insufficiency: Secondary | ICD-10-CM | POA: Diagnosis present

## 2016-12-26 DIAGNOSIS — R0989 Other specified symptoms and signs involving the circulatory and respiratory systems: Secondary | ICD-10-CM | POA: Diagnosis not present

## 2016-12-26 DIAGNOSIS — Z6841 Body Mass Index (BMI) 40.0 and over, adult: Secondary | ICD-10-CM | POA: Diagnosis not present

## 2016-12-26 DIAGNOSIS — R4781 Slurred speech: Secondary | ICD-10-CM | POA: Diagnosis not present

## 2016-12-26 DIAGNOSIS — J9620 Acute and chronic respiratory failure, unspecified whether with hypoxia or hypercapnia: Secondary | ICD-10-CM | POA: Diagnosis not present

## 2016-12-26 DIAGNOSIS — R34 Anuria and oliguria: Secondary | ICD-10-CM | POA: Diagnosis not present

## 2016-12-26 DIAGNOSIS — N186 End stage renal disease: Secondary | ICD-10-CM | POA: Diagnosis present

## 2016-12-26 DIAGNOSIS — D649 Anemia, unspecified: Secondary | ICD-10-CM | POA: Diagnosis not present

## 2016-12-26 DIAGNOSIS — J9600 Acute respiratory failure, unspecified whether with hypoxia or hypercapnia: Secondary | ICD-10-CM

## 2016-12-26 DIAGNOSIS — Z955 Presence of coronary angioplasty implant and graft: Secondary | ICD-10-CM

## 2016-12-26 DIAGNOSIS — A419 Sepsis, unspecified organism: Secondary | ICD-10-CM | POA: Diagnosis not present

## 2016-12-26 DIAGNOSIS — E1165 Type 2 diabetes mellitus with hyperglycemia: Secondary | ICD-10-CM | POA: Diagnosis present

## 2016-12-26 DIAGNOSIS — I480 Paroxysmal atrial fibrillation: Secondary | ICD-10-CM | POA: Diagnosis not present

## 2016-12-26 DIAGNOSIS — E785 Hyperlipidemia, unspecified: Secondary | ICD-10-CM | POA: Diagnosis present

## 2016-12-26 DIAGNOSIS — R5381 Other malaise: Secondary | ICD-10-CM | POA: Diagnosis not present

## 2016-12-26 DIAGNOSIS — Z9981 Dependence on supplemental oxygen: Secondary | ICD-10-CM

## 2016-12-26 DIAGNOSIS — K5900 Constipation, unspecified: Secondary | ICD-10-CM | POA: Diagnosis present

## 2016-12-26 DIAGNOSIS — Z452 Encounter for adjustment and management of vascular access device: Secondary | ICD-10-CM | POA: Diagnosis not present

## 2016-12-26 DIAGNOSIS — R410 Disorientation, unspecified: Secondary | ICD-10-CM | POA: Diagnosis not present

## 2016-12-26 DIAGNOSIS — I340 Nonrheumatic mitral (valve) insufficiency: Secondary | ICD-10-CM | POA: Diagnosis not present

## 2016-12-26 DIAGNOSIS — I5043 Acute on chronic combined systolic (congestive) and diastolic (congestive) heart failure: Secondary | ICD-10-CM | POA: Diagnosis present

## 2016-12-26 DIAGNOSIS — M170 Bilateral primary osteoarthritis of knee: Secondary | ICD-10-CM | POA: Diagnosis present

## 2016-12-26 DIAGNOSIS — Z1621 Resistance to vancomycin: Secondary | ICD-10-CM | POA: Diagnosis not present

## 2016-12-26 DIAGNOSIS — D72829 Elevated white blood cell count, unspecified: Secondary | ICD-10-CM | POA: Diagnosis not present

## 2016-12-26 DIAGNOSIS — I482 Chronic atrial fibrillation, unspecified: Secondary | ICD-10-CM | POA: Diagnosis present

## 2016-12-26 DIAGNOSIS — R609 Edema, unspecified: Secondary | ICD-10-CM

## 2016-12-26 DIAGNOSIS — I509 Heart failure, unspecified: Secondary | ICD-10-CM | POA: Diagnosis not present

## 2016-12-26 DIAGNOSIS — N179 Acute kidney failure, unspecified: Secondary | ICD-10-CM | POA: Diagnosis not present

## 2016-12-26 DIAGNOSIS — J90 Pleural effusion, not elsewhere classified: Secondary | ICD-10-CM | POA: Diagnosis not present

## 2016-12-26 DIAGNOSIS — E662 Morbid (severe) obesity with alveolar hypoventilation: Secondary | ICD-10-CM | POA: Diagnosis present

## 2016-12-26 DIAGNOSIS — N183 Chronic kidney disease, stage 3 (moderate): Secondary | ICD-10-CM | POA: Diagnosis not present

## 2016-12-26 DIAGNOSIS — J811 Chronic pulmonary edema: Secondary | ICD-10-CM

## 2016-12-26 DIAGNOSIS — I481 Persistent atrial fibrillation: Secondary | ICD-10-CM | POA: Diagnosis not present

## 2016-12-26 DIAGNOSIS — I671 Cerebral aneurysm, nonruptured: Secondary | ICD-10-CM | POA: Diagnosis present

## 2016-12-26 DIAGNOSIS — N185 Chronic kidney disease, stage 5: Secondary | ICD-10-CM | POA: Diagnosis not present

## 2016-12-26 DIAGNOSIS — N184 Chronic kidney disease, stage 4 (severe): Secondary | ICD-10-CM | POA: Diagnosis not present

## 2016-12-26 DIAGNOSIS — Z9119 Patient's noncompliance with other medical treatment and regimen: Secondary | ICD-10-CM | POA: Diagnosis not present

## 2016-12-26 DIAGNOSIS — I132 Hypertensive heart and chronic kidney disease with heart failure and with stage 5 chronic kidney disease, or end stage renal disease: Principal | ICD-10-CM | POA: Diagnosis present

## 2016-12-26 DIAGNOSIS — I700 Atherosclerosis of aorta: Secondary | ICD-10-CM | POA: Diagnosis present

## 2016-12-26 DIAGNOSIS — R1031 Right lower quadrant pain: Secondary | ICD-10-CM | POA: Diagnosis not present

## 2016-12-26 DIAGNOSIS — D689 Coagulation defect, unspecified: Secondary | ICD-10-CM | POA: Diagnosis not present

## 2016-12-26 DIAGNOSIS — A4181 Sepsis due to Enterococcus: Secondary | ICD-10-CM | POA: Diagnosis not present

## 2016-12-26 DIAGNOSIS — M21371 Foot drop, right foot: Secondary | ICD-10-CM | POA: Diagnosis present

## 2016-12-26 DIAGNOSIS — Z7901 Long term (current) use of anticoagulants: Secondary | ICD-10-CM

## 2016-12-26 DIAGNOSIS — I6032 Nontraumatic subarachnoid hemorrhage from left posterior communicating artery: Secondary | ICD-10-CM | POA: Diagnosis not present

## 2016-12-26 DIAGNOSIS — I9589 Other hypotension: Secondary | ICD-10-CM | POA: Diagnosis not present

## 2016-12-26 DIAGNOSIS — J810 Acute pulmonary edema: Secondary | ICD-10-CM | POA: Diagnosis not present

## 2016-12-26 DIAGNOSIS — I611 Nontraumatic intracerebral hemorrhage in hemisphere, cortical: Secondary | ICD-10-CM | POA: Diagnosis not present

## 2016-12-26 DIAGNOSIS — I130 Hypertensive heart and chronic kidney disease with heart failure and stage 1 through stage 4 chronic kidney disease, or unspecified chronic kidney disease: Secondary | ICD-10-CM | POA: Diagnosis not present

## 2016-12-26 DIAGNOSIS — I2729 Other secondary pulmonary hypertension: Secondary | ICD-10-CM | POA: Diagnosis not present

## 2016-12-26 DIAGNOSIS — D65 Disseminated intravascular coagulation [defibrination syndrome]: Secondary | ICD-10-CM | POA: Diagnosis not present

## 2016-12-26 DIAGNOSIS — I5084 End stage heart failure: Secondary | ICD-10-CM | POA: Diagnosis present

## 2016-12-26 DIAGNOSIS — R6521 Severe sepsis with septic shock: Secondary | ICD-10-CM | POA: Diagnosis not present

## 2016-12-26 DIAGNOSIS — Z4682 Encounter for fitting and adjustment of non-vascular catheter: Secondary | ICD-10-CM | POA: Diagnosis not present

## 2016-12-26 DIAGNOSIS — I484 Atypical atrial flutter: Secondary | ICD-10-CM | POA: Diagnosis not present

## 2016-12-26 DIAGNOSIS — Z1639 Resistance to other specified antimicrobial drug: Secondary | ICD-10-CM | POA: Diagnosis not present

## 2016-12-26 DIAGNOSIS — G9341 Metabolic encephalopathy: Secondary | ICD-10-CM | POA: Diagnosis not present

## 2016-12-26 DIAGNOSIS — G936 Cerebral edema: Secondary | ICD-10-CM | POA: Diagnosis not present

## 2016-12-26 DIAGNOSIS — J9622 Acute and chronic respiratory failure with hypercapnia: Secondary | ICD-10-CM | POA: Diagnosis not present

## 2016-12-26 DIAGNOSIS — I619 Nontraumatic intracerebral hemorrhage, unspecified: Secondary | ICD-10-CM

## 2016-12-26 DIAGNOSIS — R4701 Aphasia: Secondary | ICD-10-CM | POA: Diagnosis not present

## 2016-12-26 DIAGNOSIS — B952 Enterococcus as the cause of diseases classified elsewhere: Secondary | ICD-10-CM | POA: Diagnosis not present

## 2016-12-26 DIAGNOSIS — N189 Chronic kidney disease, unspecified: Secondary | ICD-10-CM | POA: Diagnosis not present

## 2016-12-26 DIAGNOSIS — I517 Cardiomegaly: Secondary | ICD-10-CM | POA: Diagnosis not present

## 2016-12-26 DIAGNOSIS — S065X9A Traumatic subdural hemorrhage with loss of consciousness of unspecified duration, initial encounter: Secondary | ICD-10-CM

## 2016-12-26 DIAGNOSIS — R109 Unspecified abdominal pain: Secondary | ICD-10-CM

## 2016-12-26 DIAGNOSIS — R0902 Hypoxemia: Secondary | ICD-10-CM | POA: Diagnosis not present

## 2016-12-26 DIAGNOSIS — R918 Other nonspecific abnormal finding of lung field: Secondary | ICD-10-CM | POA: Diagnosis not present

## 2016-12-26 DIAGNOSIS — I6202 Nontraumatic subacute subdural hemorrhage: Secondary | ICD-10-CM | POA: Diagnosis not present

## 2016-12-26 DIAGNOSIS — I1310 Hypertensive heart and chronic kidney disease without heart failure, with stage 1 through stage 4 chronic kidney disease, or unspecified chronic kidney disease: Secondary | ICD-10-CM | POA: Diagnosis present

## 2016-12-26 DIAGNOSIS — I10 Essential (primary) hypertension: Secondary | ICD-10-CM | POA: Diagnosis not present

## 2016-12-26 DIAGNOSIS — R791 Abnormal coagulation profile: Secondary | ICD-10-CM | POA: Diagnosis not present

## 2016-12-26 DIAGNOSIS — N170 Acute kidney failure with tubular necrosis: Secondary | ICD-10-CM

## 2016-12-26 DIAGNOSIS — J9621 Acute and chronic respiratory failure with hypoxia: Secondary | ICD-10-CM | POA: Diagnosis not present

## 2016-12-26 DIAGNOSIS — I5021 Acute systolic (congestive) heart failure: Secondary | ICD-10-CM | POA: Diagnosis not present

## 2016-12-26 DIAGNOSIS — Z9889 Other specified postprocedural states: Secondary | ICD-10-CM | POA: Diagnosis not present

## 2016-12-26 DIAGNOSIS — I6200 Nontraumatic subdural hemorrhage, unspecified: Secondary | ICD-10-CM | POA: Diagnosis not present

## 2016-12-26 DIAGNOSIS — R0602 Shortness of breath: Secondary | ICD-10-CM | POA: Diagnosis not present

## 2016-12-26 DIAGNOSIS — Z79899 Other long term (current) drug therapy: Secondary | ICD-10-CM | POA: Diagnosis not present

## 2016-12-26 DIAGNOSIS — S065X0A Traumatic subdural hemorrhage without loss of consciousness, initial encounter: Secondary | ICD-10-CM | POA: Diagnosis not present

## 2016-12-26 DIAGNOSIS — Z992 Dependence on renal dialysis: Secondary | ICD-10-CM | POA: Diagnosis not present

## 2016-12-26 DIAGNOSIS — J9601 Acute respiratory failure with hypoxia: Secondary | ICD-10-CM

## 2016-12-26 DIAGNOSIS — I629 Nontraumatic intracranial hemorrhage, unspecified: Secondary | ICD-10-CM | POA: Diagnosis not present

## 2016-12-26 DIAGNOSIS — F419 Anxiety disorder, unspecified: Secondary | ICD-10-CM | POA: Diagnosis not present

## 2016-12-26 DIAGNOSIS — Z993 Dependence on wheelchair: Secondary | ICD-10-CM

## 2016-12-26 DIAGNOSIS — D631 Anemia in chronic kidney disease: Secondary | ICD-10-CM | POA: Diagnosis not present

## 2016-12-26 DIAGNOSIS — J984 Other disorders of lung: Secondary | ICD-10-CM | POA: Diagnosis not present

## 2016-12-26 DIAGNOSIS — M069 Rheumatoid arthritis, unspecified: Secondary | ICD-10-CM | POA: Diagnosis present

## 2016-12-26 DIAGNOSIS — Z4901 Encounter for fitting and adjustment of extracorporeal dialysis catheter: Secondary | ICD-10-CM | POA: Diagnosis not present

## 2016-12-26 DIAGNOSIS — J80 Acute respiratory distress syndrome: Secondary | ICD-10-CM | POA: Diagnosis not present

## 2016-12-26 DIAGNOSIS — Z01810 Encounter for preprocedural cardiovascular examination: Secondary | ICD-10-CM | POA: Diagnosis not present

## 2016-12-26 DIAGNOSIS — I4892 Unspecified atrial flutter: Secondary | ICD-10-CM | POA: Diagnosis not present

## 2016-12-26 DIAGNOSIS — I610 Nontraumatic intracerebral hemorrhage in hemisphere, subcortical: Secondary | ICD-10-CM | POA: Diagnosis not present

## 2016-12-26 DIAGNOSIS — D181 Lymphangioma, any site: Secondary | ICD-10-CM

## 2016-12-26 DIAGNOSIS — R1032 Left lower quadrant pain: Secondary | ICD-10-CM | POA: Diagnosis not present

## 2016-12-26 DIAGNOSIS — E1122 Type 2 diabetes mellitus with diabetic chronic kidney disease: Secondary | ICD-10-CM | POA: Diagnosis present

## 2016-12-26 DIAGNOSIS — Z978 Presence of other specified devices: Secondary | ICD-10-CM | POA: Diagnosis not present

## 2016-12-26 DIAGNOSIS — D696 Thrombocytopenia, unspecified: Secondary | ICD-10-CM | POA: Diagnosis not present

## 2016-12-26 DIAGNOSIS — Z87891 Personal history of nicotine dependence: Secondary | ICD-10-CM

## 2016-12-26 DIAGNOSIS — I472 Ventricular tachycardia: Secondary | ICD-10-CM | POA: Diagnosis not present

## 2016-12-26 DIAGNOSIS — R100 Acute abdomen: Secondary | ICD-10-CM | POA: Diagnosis not present

## 2016-12-26 DIAGNOSIS — Z8249 Family history of ischemic heart disease and other diseases of the circulatory system: Secondary | ICD-10-CM

## 2016-12-26 DIAGNOSIS — D7582 Heparin induced thrombocytopenia (HIT): Secondary | ICD-10-CM

## 2016-12-26 DIAGNOSIS — E8770 Fluid overload, unspecified: Secondary | ICD-10-CM | POA: Diagnosis not present

## 2016-12-26 DIAGNOSIS — Z947 Corneal transplant status: Secondary | ICD-10-CM

## 2016-12-26 DIAGNOSIS — Z4659 Encounter for fitting and adjustment of other gastrointestinal appliance and device: Secondary | ICD-10-CM

## 2016-12-26 DIAGNOSIS — R570 Cardiogenic shock: Secondary | ICD-10-CM

## 2016-12-26 DIAGNOSIS — J310 Chronic rhinitis: Secondary | ICD-10-CM | POA: Diagnosis present

## 2016-12-26 DIAGNOSIS — E2740 Unspecified adrenocortical insufficiency: Secondary | ICD-10-CM | POA: Diagnosis not present

## 2016-12-26 DIAGNOSIS — Z880 Allergy status to penicillin: Secondary | ICD-10-CM

## 2016-12-26 DIAGNOSIS — I5033 Acute on chronic diastolic (congestive) heart failure: Secondary | ICD-10-CM | POA: Diagnosis not present

## 2016-12-26 DIAGNOSIS — R110 Nausea: Secondary | ICD-10-CM | POA: Diagnosis not present

## 2016-12-26 DIAGNOSIS — I120 Hypertensive chronic kidney disease with stage 5 chronic kidney disease or end stage renal disease: Secondary | ICD-10-CM | POA: Diagnosis not present

## 2016-12-26 DIAGNOSIS — H5702 Anisocoria: Secondary | ICD-10-CM | POA: Diagnosis present

## 2016-12-26 DIAGNOSIS — G8929 Other chronic pain: Secondary | ICD-10-CM | POA: Diagnosis not present

## 2016-12-26 DIAGNOSIS — R509 Fever, unspecified: Secondary | ICD-10-CM

## 2016-12-26 DIAGNOSIS — Z5111 Encounter for antineoplastic chemotherapy: Secondary | ICD-10-CM | POA: Diagnosis not present

## 2016-12-26 DIAGNOSIS — R404 Transient alteration of awareness: Secondary | ICD-10-CM

## 2016-12-26 DIAGNOSIS — T827XXA Infection and inflammatory reaction due to other cardiac and vascular devices, implants and grafts, initial encounter: Secondary | ICD-10-CM

## 2016-12-26 DIAGNOSIS — I712 Thoracic aortic aneurysm, without rupture: Secondary | ICD-10-CM | POA: Diagnosis not present

## 2016-12-26 DIAGNOSIS — E876 Hypokalemia: Secondary | ICD-10-CM | POA: Diagnosis not present

## 2016-12-26 DIAGNOSIS — Z8679 Personal history of other diseases of the circulatory system: Secondary | ICD-10-CM | POA: Diagnosis not present

## 2016-12-26 DIAGNOSIS — E7800 Pure hypercholesterolemia, unspecified: Secondary | ICD-10-CM | POA: Diagnosis present

## 2016-12-26 DIAGNOSIS — I5082 Biventricular heart failure: Secondary | ICD-10-CM | POA: Diagnosis present

## 2016-12-26 DIAGNOSIS — R1030 Lower abdominal pain, unspecified: Secondary | ICD-10-CM | POA: Diagnosis not present

## 2016-12-26 DIAGNOSIS — N3289 Other specified disorders of bladder: Secondary | ICD-10-CM | POA: Diagnosis present

## 2016-12-26 DIAGNOSIS — I129 Hypertensive chronic kidney disease with stage 1 through stage 4 chronic kidney disease, or unspecified chronic kidney disease: Secondary | ICD-10-CM | POA: Diagnosis not present

## 2016-12-26 DIAGNOSIS — N19 Unspecified kidney failure: Secondary | ICD-10-CM

## 2016-12-26 DIAGNOSIS — S065XAA Traumatic subdural hemorrhage with loss of consciousness status unknown, initial encounter: Secondary | ICD-10-CM

## 2016-12-26 HISTORY — DX: Other persistent atrial fibrillation: I48.19

## 2016-12-26 LAB — CBC WITH DIFFERENTIAL/PLATELET
BASOS ABS: 0 10*3/uL (ref 0.0–0.1)
Basophils Relative: 0 %
EOS ABS: 0.1 10*3/uL (ref 0.0–0.7)
Eosinophils Relative: 1 %
HEMATOCRIT: 34.6 % — AB (ref 36.0–46.0)
Hemoglobin: 9.5 g/dL — ABNORMAL LOW (ref 12.0–15.0)
LYMPHS ABS: 1.9 10*3/uL (ref 0.7–4.0)
LYMPHS PCT: 17 %
MCH: 24.9 pg — ABNORMAL LOW (ref 26.0–34.0)
MCHC: 27.5 g/dL — ABNORMAL LOW (ref 30.0–36.0)
MCV: 90.8 fL (ref 78.0–100.0)
MONOS PCT: 9 %
Monocytes Absolute: 1 10*3/uL (ref 0.1–1.0)
Neutro Abs: 8.1 10*3/uL — ABNORMAL HIGH (ref 1.7–7.7)
Neutrophils Relative %: 73 %
Platelets: 304 10*3/uL (ref 150–400)
RBC: 3.81 MIL/uL — AB (ref 3.87–5.11)
RDW: 24.5 % — AB (ref 11.5–15.5)
WBC: 11.1 10*3/uL — AB (ref 4.0–10.5)

## 2016-12-26 LAB — BASIC METABOLIC PANEL
ANION GAP: 14 (ref 5–15)
Anion gap: 11 (ref 5–15)
BUN: 61 mg/dL — ABNORMAL HIGH (ref 6–20)
BUN: 62 mg/dL — AB (ref 6–20)
CALCIUM: 9.3 mg/dL (ref 8.9–10.3)
CHLORIDE: 100 mmol/L — AB (ref 101–111)
CO2: 28 mmol/L (ref 22–32)
CO2: 31 mmol/L (ref 22–32)
CREATININE: 3.22 mg/dL — AB (ref 0.44–1.00)
Calcium: 9.1 mg/dL (ref 8.9–10.3)
Chloride: 101 mmol/L (ref 101–111)
Creatinine, Ser: 3.03 mg/dL — ABNORMAL HIGH (ref 0.44–1.00)
GFR calc Af Amer: 17 mL/min — ABNORMAL LOW (ref >60)
GFR calc non Af Amer: 15 mL/min — ABNORMAL LOW (ref >60)
GFR, EST AFRICAN AMERICAN: 18 mL/min — AB (ref >60)
GFR, EST NON AFRICAN AMERICAN: 16 mL/min — AB (ref >60)
Glucose, Bld: 100 mg/dL — ABNORMAL HIGH (ref 65–99)
Glucose, Bld: 108 mg/dL — ABNORMAL HIGH (ref 65–99)
Potassium: 6.3 mmol/L (ref 3.5–5.1)
Potassium: 6.2 mmol/L — ABNORMAL HIGH (ref 3.5–5.1)
SODIUM: 143 mmol/L (ref 135–145)
SODIUM: 142 mmol/L (ref 135–145)

## 2016-12-26 LAB — BRAIN NATRIURETIC PEPTIDE: B NATRIURETIC PEPTIDE 5: 1116 pg/mL — AB (ref 0.0–100.0)

## 2016-12-26 LAB — I-STAT TROPONIN, ED: TROPONIN I, POC: 0.01 ng/mL (ref 0.00–0.08)

## 2016-12-26 LAB — CBG MONITORING, ED: Glucose-Capillary: 88 mg/dL (ref 65–99)

## 2016-12-26 MED ORDER — FERROUS SULFATE 325 (65 FE) MG PO TABS
325.0000 mg | ORAL_TABLET | Freq: Three times a day (TID) | ORAL | Status: DC
  Administered 2016-12-27 – 2017-01-02 (×18): 325 mg via ORAL
  Filled 2016-12-26 (×19): qty 1

## 2016-12-26 MED ORDER — INSULIN ASPART 100 UNIT/ML IV SOLN
5.0000 [IU] | Freq: Once | INTRAVENOUS | Status: AC
  Administered 2016-12-26: 5 [IU] via INTRAVENOUS
  Filled 2016-12-26: qty 0.05

## 2016-12-26 MED ORDER — SODIUM CHLORIDE 0.9 % IV SOLN: 250.0000 mL | INTRAVENOUS | Status: DC | PRN

## 2016-12-26 MED ORDER — ONDANSETRON HCL 4 MG/2ML IJ SOLN
4.0000 mg | Freq: Once | INTRAMUSCULAR | Status: AC
  Administered 2016-12-26: 4 mg via INTRAVENOUS
  Filled 2016-12-26: qty 2

## 2016-12-26 MED ORDER — PROMETHAZINE HCL 25 MG PO TABS
25.0000 mg | ORAL_TABLET | Freq: Four times a day (QID) | ORAL | Status: DC | PRN
  Administered 2017-01-19: 25 mg via ORAL
  Filled 2016-12-26: qty 1

## 2016-12-26 MED ORDER — POLYETHYLENE GLYCOL 3350 17 G PO PACK
17.0000 g | PACK | Freq: Every day | ORAL | Status: DC | PRN
  Administered 2016-12-27 – 2017-01-09 (×2): 17 g via ORAL
  Filled 2016-12-26 (×2): qty 1

## 2016-12-26 MED ORDER — SODIUM CHLORIDE 0.9% FLUSH: 3.0000 mL | INTRAVENOUS | Status: DC | PRN

## 2016-12-26 MED ORDER — DEXTROSE 50 % IV SOLN
1.0000 | Freq: Once | INTRAVENOUS | Status: AC
  Administered 2016-12-26: 50 mL via INTRAVENOUS
  Filled 2016-12-26: qty 50

## 2016-12-26 MED ORDER — ACETAMINOPHEN 325 MG PO TABS
650.0000 mg | ORAL_TABLET | ORAL | Status: DC | PRN
  Administered 2017-01-08 – 2017-01-11 (×2): 650 mg via ORAL
  Filled 2016-12-26 (×2): qty 2

## 2016-12-26 MED ORDER — DILTIAZEM HCL ER COATED BEADS 180 MG PO CP24
180.0000 mg | ORAL_CAPSULE | Freq: Every day | ORAL | Status: DC
  Administered 2016-12-27: 180 mg via ORAL
  Filled 2016-12-26 (×4): qty 1

## 2016-12-26 MED ORDER — ONDANSETRON HCL 4 MG/2ML IJ SOLN
4.0000 mg | Freq: Four times a day (QID) | INTRAMUSCULAR | Status: DC | PRN
  Administered 2017-01-15 – 2017-01-18 (×2): 4 mg via INTRAVENOUS
  Filled 2016-12-26 (×2): qty 2

## 2016-12-26 MED ORDER — OXYCODONE HCL 5 MG PO TABS
5.0000 mg | ORAL_TABLET | Freq: Four times a day (QID) | ORAL | Status: DC | PRN
  Administered 2016-12-29 – 2017-01-19 (×21): 5 mg via ORAL
  Filled 2016-12-26 (×21): qty 1

## 2016-12-26 MED ORDER — FUROSEMIDE 10 MG/ML IJ SOLN
80.0000 mg | Freq: Once | INTRAMUSCULAR | Status: AC
  Administered 2016-12-26: 80 mg via INTRAVENOUS
  Filled 2016-12-26: qty 8

## 2016-12-26 MED ORDER — FUROSEMIDE 10 MG/ML IJ SOLN
80.0000 mg | Freq: Two times a day (BID) | INTRAMUSCULAR | Status: DC
  Administered 2016-12-27: 80 mg via INTRAVENOUS
  Filled 2016-12-26: qty 8

## 2016-12-26 MED ORDER — ZOLPIDEM TARTRATE 5 MG PO TABS: 5.0000 mg | ORAL_TABLET | Freq: Every evening | ORAL | Status: DC | PRN

## 2016-12-26 MED ORDER — SODIUM CHLORIDE 0.9% FLUSH
3.0000 mL | Freq: Two times a day (BID) | INTRAVENOUS | Status: DC
  Administered 2016-12-26 – 2017-01-07 (×13): 3 mL via INTRAVENOUS

## 2016-12-26 MED ORDER — OXYCODONE HCL 5 MG PO TABS
5.0000 mg | ORAL_TABLET | Freq: Once | ORAL | Status: AC
  Administered 2016-12-26: 5 mg via ORAL
  Filled 2016-12-26: qty 1

## 2016-12-26 MED ORDER — PROPAFENONE HCL 150 MG PO TABS
150.0000 mg | ORAL_TABLET | Freq: Three times a day (TID) | ORAL | Status: DC
  Administered 2016-12-26 – 2016-12-27 (×2): 150 mg via ORAL
  Filled 2016-12-26 (×4): qty 1

## 2016-12-26 MED ORDER — DILTIAZEM HCL ER 180 MG PO CP24: 180.0000 mg | ORAL_CAPSULE | Freq: Every day | ORAL | Status: DC

## 2016-12-26 MED ORDER — SENNOSIDES-DOCUSATE SODIUM 8.6-50 MG PO TABS
2.0000 | ORAL_TABLET | Freq: Every day | ORAL | Status: DC
  Administered 2016-12-28 – 2017-01-19 (×11): 2 via ORAL
  Filled 2016-12-26 (×15): qty 2

## 2016-12-26 NOTE — H&P (Signed)
Date: 12/26/2016               Patient Name:  Paige Marshall MRN: [id number]  DOB: [birth year] Age / Sex: 61 y.o., female   PCP: Kerin Perna, NP              Medical Service: Internal Medicine Teaching Service              Attending Physician: Dr. Lucious Groves, DO    First Contact: Flo Shanks, MS 3 Pager: [phone]  Second Contact: Dr. Holley Raring Pager: [phone]  Third Contact Dr. 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 She has been living wheelchair bound in a skilled nursing facility since November recovering from a fibula fracture, and she was admitted to [facility] for multiple exacerbations of acute on chronic congestive heart failure during this time diuresed for several days (admitted w/ volume overload and PAF 08/30/17, discharged at dry weight 370). Her cardiologist, Dr. Gwenlyn Found, saw her on 12/15/16 and her weight was up to 403 lbs, he increased her diuretics to 80mg  BID IV through a pick line and added metolazone every other day. When he followed up on 3/12 her creatinine had spiked to 2.4 due to aggressive diureses so he discontinued the IV lasix, and ordered 80mg  Lasix PO BID. There is some question in the documentation as to whether she received this medication after the IV was discontinued, but she reports that they removed her pick-line and started giving it to her by mouth.   At  bedside She reports that she has increased urination when she is on her lasix, and states that this has not changed over the past weeks including urinating 5 times last night and she denies any hematuria, or pain with urination.She reports that her blood counts "come and go" and she has never been told why, and she reports 2 BMs this morning but denies hematuria or melena. She is complaining of increased leg swelling since the 12th and intermittent dyspnea when laying flat that became acutely worse last night along with an uncomfortable tightness in her abdomen and acute shortness of breath. She has been wearing O2 full time since November, but states that before her fibula fracture she only needed it at night. She reports a productive cough and wheezing last night, but states that this has resolved despite the continuation of her SOB. She reports nausea secondary to her abdominal tightness but denies vomiting. She reports persistent A-fib that she can feel, and chest pain if she does not take her carvedilol. She denies fever, chills, diarrhea, and sore throat. Despite her OSA diagnosis she denies using a CPAP, and states that it is uncomfortable, but she would be amenable to using the one with the "wings" as opposed to the mask. Her family was requesting that we including a recommendation for her to return to rehabilitation after hospitalization for their insurance.   Paige Marshall  presented to the ED in acute SOB with tachypnea to 30 and tachycardia at 115. She was found to have hyperkalemia with a K+ of 6.3 and AKI w/ creatinine of 3.03. She was given IV insulin + dextrose to temporize her hyperkalemia, and IV lasix to reduce her fluid load. Her BNP was significantly elevated at 1100 up from her baseline of 200-400.   Meds: Current Facility-Administered Medications  Medication Dose Route Frequency Provider Last Rate Last Dose  . dextrose 50 % solution 50 mL  1 ampule Intravenous Once Monico Blitz, MD      . insulin aspart (novoLOG) injection 5 Units  5 Units Intravenous Once Monico Blitz, MD       Current Outpatient Prescriptions  Medication Sig Dispense Refill  . ARTIFICIAL TEAR OP Apply 1 drop to eye every 6 (six) hours as needed (dry eyes).    . carvedilol (COREG) 6.25 MG tablet Take 1 tablet (6.25 mg total) by mouth 2 (two) times daily with a meal.    . diclofenac sodium (VOLTAREN) 1 % GEL Apply 4 g topically every 6 (six) hours as needed (pain).     [name] diltiazem (DILACOR XR) 180 MG 24 hr capsule Take 180 mg by mouth daily.    [name] docusate sodium (COLACE) 100 MG capsule Take 1 capsule (100 mg total) by mouth 2 (two) times daily. 10 capsule 0  . ferrous sulfate 325 (65 FE) MG tablet Take 1 tablet (325 mg total) by mouth 3 (three) times daily with meals.  3  . furosemide (LASIX) 40 MG tablet Take 1 tablet (40 mg total) by mouth 2 (two) times daily. (Patient taking differently: Take 80 mg by mouth 2 (two) times daily. ) 60 tablet 0  . metolazone (ZAROXOLYN) 2.5 MG tablet Take 1 tablet (2.5 mg total) by mouth daily. 90 tablet 1  . Multiple Vitamin (MULTIVITAMIN WITH MINERALS) TABS tablet Take 1 tablet by mouth daily.    [name] omega-3 acid ethyl esters (LOVAZA) 1 g capsule Take 1 g by mouth 2 (two) times daily.    [name] oxyCODONE (OXY IR/ROXICODONE) 5 MG immediate release tablet Take 1 tablet (5 mg total) by mouth every 6 (six) hours as needed for breakthrough pain. 15 tablet 0  . polyethylene glycol (MIRALAX / GLYCOLAX) packet Take 17 g by mouth daily as needed. (Patient taking differently: Take 17 g by mouth daily as needed (constipation). Mix in 8 oz liquid and drink) 14 each 0  . potassium chloride SA (K-DUR,KLOR-CON) 20 MEQ tablet Take 40 mEq by mouth 3 (three) times daily.     . predniSONE (DELTASONE) 10 MG tablet Take 40 mg tablet 11/14/2016 and taper down by 10 mg daily until completed 15 tablet 0  . PROAIR HFA 108 (90 Base) MCG/ACT inhaler INHALE 2 PUFFS EVERY 6 HOURS AS  NEEDED FOR WHEEZING OR SHORTNESS OF BREATH. 8.5 Inhaler 1  . promethazine (PHENERGAN) 25 MG tablet Take 25 mg by mouth every 6 (six) hours as needed for nausea or vomiting.    . propafenone (RYTHMOL) 150 MG tablet Take 1 tablet (150 mg total) by mouth every 8 (eight) hours. 10 tablet 0  . senna-docusate (SENNA S) 8.6-50 MG tablet Take 2 tablets by mouth See admin instructions. Take 2 tablet by mouth daily at bedtime, may also take 2 tablets as needed for constipation    . warfarin (COUMADIN) 5 MG tablet Take 5 mg by mouth daily.    [name] zolpidem (AMBIEN) 5 MG tablet  Take 5 mg by mouth at bedtime as needed for sleep.    . bisacodyl (DULCOLAX) 5 MG EC tablet Take 1 tablet (5 mg total) by mouth daily as needed for moderate constipation. (Patient not taking: Reported on 12/26/2016) 30 tablet 0  . PRESCRIPTION MEDICATION Inhale into the lungs at bedtime. BIPAP      Allergies: Allergies as of 12/26/2016 - Review Complete 12/26/2016  Allergen Reaction Noted  . Penicillins Hives, Itching, Swelling, and Other (See Comments) 12/08/2011  . Citrus Hives 04/04/2016   Past Medical History:  Diagnosis Date  . Arthritis    "both knees" (01/14/2015)  . Asthma   . Chronic combined systolic and diastolic CHF (congestive heart failure) ([place])   . CKD (chronic kidney disease), stage III   . COPD (chronic obstructive pulmonary disease) ([place])   . Degenerative joint disease of both lower legs   . Edema    Lower extremity  . Former smoker   . Gout    hx in "both knees" (01/14/2015)  . H. pylori infection 01/03/2014   +breath test.    . High cholesterol   . Hypertension   . Morbid obesity ([place])   . NICM (nonischemic cardiomyopathy) ([place])    a. LHC 01/2015: normal cors, EF 45-50%. b. EF 50-55% in 08/2016.  [name] Nonischemic cardiomyopathy ([place])   . Normal coronary arteries   . OSA (obstructive sleep apnea)    "never sent CPAP out" (01/14/2015)  . Paroxysmal atrial fibrillation (HCC)   . Pneumonia X 2  . Pulmonary  hypertension   . Respiratory failure ([place])   . Rheumatoid arthritis ([place])   . Stroke [facility])    Past Surgical History:  Procedure Laterality Date  . ANGIOGRAM/LV (CONGENITAL)  2007  . BREATH TEK H PYLORI N/A 12/31/2013   Procedure: [place];  Surgeon: Gayland Curry, MD;  Location: [facility];  Service: General;  Laterality: N/A;  . CORNEAL TRANSPLANT Right ~ 2010  . CORONARY ANGIOPLASTY WITH STENT PLACEMENT  11/04/2005   "1"  . DOPPLER ECHOCARDIOGRAPHY     2 D   EF of 45%  . EYE SURGERY    . LEFT HEART CATHETERIZATION WITH CORONARY ANGIOGRAM N/A 01/20/2015   Procedure: LEFT HEART CATHETERIZATION WITH CORONARY ANGIOGRAM;  Surgeon: Lorretta Harp, MD;  Location: [facility] CATH LAB;  Service: Cardiovascular;  Laterality: N/A;  . sleep study  2011  . stress myocardial dipyridamole perfusion    . TUBAL LIGATION  1982  . venous duplex ultrasound  2013   Family History  Problem Relation Age of Onset  . Heart disease Mother   . Cancer Father     colon  . Hypertension Other    Social History: Ms. Abshier is a 61 year old morbidly overweight, widowed AA woman. She has two children and 3 grandchildren. 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 She quit smoking one year ago, but has a 35 pack year history. She drinks about 4, 24oz cans of beer per week. She denies illicit drug use.   Review of Systems: General: denies fever, chills HEENT: denies sore throat Pulmonary: SOB, productive cough yesterday Cardio: palpitations, orthopnea, no chest pain GI: abdominal pain (tightness), nausea, no vomiting, no diarrhea GU: urinary frequency, no dysuria, hematuria Extremities: bilateral leg swelling, pain in LLE (recovering from fibula fracture)  Physical Exam: Blood pressure (!) 131/99, pulse (!) 117, temperature 98.8 F (37.1 C), temperature source Oral, resp. rate (!) 29,  last menstrual period 09/07/2011, SpO2 99 %. General: morbidly obese,  in mild distress HEENT: oral pharynx clear, mucus membranes moist, normal hearing Cardiovascular: irregularly irregular rhythm, no murmur, distal pulses intact Pulmonary: respiratory distress, distant breaths sounds, no wheezes, scattered crackles (especially right lower lobe), no tenderness to palpation Abdominal: Soft. Diffusely tender to palpation. Bowel sounds present. No hepatosplenomegaly. Musculoskeletal: normal range of motion Neurological: alert and oriented x3, normal strength Skin: skin is warm and dry, anisocoria  Psych: normal affect, answers questions appropriately   Lab results: Potassium = 6.3 (likely elevated due to fluid retention, will attempt to manage with diuresis and precipitate into stool with kayaxelate)  Bun = 61 Creatinine = 3.03 (elevated, will use to monitor renal function, important in this patient as overly aggressive diuresis can reduce kidney perfusion) WBC = 11.1 (lymphocytes 8.1)  RBC = 3.81  Hemoglobin = 9.5 (mild normocytic anemia, possibly blood loss or a hemolytic process pertaining to her AKI).  MCV = 90.8  Imaging results:  Dg Chest Port 1 View  Result Date: 12/26/2016 CLINICAL DATA:  Dyspnea, respiratory distress. EXAM: PORTABLE CHEST 1 VIEW COMPARISON:  11/07/2016 FINDINGS: Patient rotated to the right. Lungs are adequately inflated demonstrate prominent perihilar markings suggesting mild vascular congestion. Stable hazy left base/ retrocardiac opacification. Moderate stable cardiomegaly. Calcified plaque over the aortic arch. Remainder of the exam is unchanged. IMPRESSION: Moderate stable cardiomegaly with suggestion of vascular congestion. Could not exclude effusions/atelectasis versus infection in the left base. Electronically Signed   By: Marin Olp M.D.   On: 12/26/2016 15:30    Other results: EKG: Atrial fibrillation, rate 115, no changes due to hyperkalemia   Assessment & Plan by Problem: Principal Problem:   Acute on chronic combined  systolic and diastolic CHF (congestive heart failure) (HCC) Active Problems:   Essential hypertension   PAF (paroxysmal atrial fibrillation) (HCC)   Obstructive sleep apnea   Morbid obesity ([place])   Obesity hypoventilation syndrome (HCC)   COPD mixed type (HCC)   Acute kidney injury superimposed on CKD ([place])  Ms. Linarez is a 61 year old woman with a history of chronic combined systolic/diastolic CHF (EF 16%), COPD, CKD stage III, paroxysmal atrial fibrillation, HTN, morbid obesity, pulmonary hypertension, and OSA who presented today with shortness of breath, bilateral leg swelling, orthopnea, and tightness in her abdomen.   1) Acute on chronic combined systolic and diastolic CHF: Pt is complaining of SOB, bilateral leg swelling, orthopnea, and tightness in her abdomen that, given her history of CHF exacerbation, are almost certainly secondary to fluid overload. Her CXR showed a possible pleural effusion, and her exam showed 2+ peripheral edema, anisocoria, and diffuse abdominal tenderness to light palpitation. Diuresing the fluid off of Ms. Hurley Cisco is not as simple as adding high dose lasix, because rapidly decreased volume status could hypoperfuse her kidneys and exacerbate her CKD. We will start her on IV lasix 80mg  BID, but consult cardiology about appropriate management given this patients complicated history and CKD. We will continue her carvedilol for rate control of her a-fib.  -admit to telemetry -IV lasix 80mg  BId -carvidelol 6.25 BID.  -consult cardiology, appreciate recomdations  2) Acute on chronic kidney disease (hyperkalemia): Ms. Dwight K+ was 6.3 on arrival in the ED, but her EKG does not show high peaked T waves or other changes of hyperkalemia. (hyperkalemia can cause arrhythmia by altering cardiomyocyte action potential). Her baseline sCR appears to be about 1.8-2.0 (stage III/IV CKD). Her Cr was 3.03 on arrival. Her IV  lasix were recently discontinued after a bump in her  creatinine to 2.4 on 3/12. We will diurese her for her CHF which will help to reduce K+.  It is important to consider an obstructive process given her high fluid overload and abdominal tightness, we will rule this out with a renal ultrasound. We will also need to watch her BUN and creatinine carefully as we begin to diurese her to ensure she does not get acute tubular necrosis due to hypoperfusion if we decrease her BP to treat the heart failure. We will check a UA now to see if she has an underlying asymptomatic UTI or if she is already producing the muddy brown casts associated with tubular injury. She was temporized with lasix and insulin/dextrose in the ED, given her volume overloaded status indicating decreased fluid output we will not give her more insulin. Kayaxelate is more appropriate to precipitate K+ into her stool. We will monitor her K+ with BMP.  -Kayaxelate -trend BMP  -UA -renal ultrasound -treat volume overload as above w/ diuresis  3) Paroxysmal atrial fibrillation: Pt reports a constant state of a-fib rate, and noticeable palpitations without carvedilol rate control BID. She is in A-fib on exam. Her EKG demonstrates a widened QRS interval. Her INR is supra therapeutic at 4.12 so we do not need to continue her warfarin at this point, we will continue to monitor her INR. She takes diltiazem xr 180mg  qD and carvedilol 6.25 BID. We will continue her carvedilol to control her rate, but hold the diltiazem due to soft BP one exam. It is possible that the BP cough is not sufficient for this patient's arm circumference so this data may be suspect in which case we will consider returning diltiazem to the regimen. She is rhythm controlled with class IC antiarrythmic propafenone, we will continue this. -continue carvidelol 6.25 BID -hold diltiazem  -continue propafenone -discontinue coumadin -trend inr -cardiology c/s, appreciate recs  4) COPD: Unclear severity, she had PFTs ordered by Dr.  Annamaria Boots on 10/24/2015 but they never occurred. We will try to return the pt to her baseline and order PFTs when she is an outpatient, because her lung disease is not well characterized. Her white count is mildly elevated at 11.1 which could indicate inflammation associated with COPD.  However, she mentioned a productive cough last night, but this has resolved and there were no wheezes or signs of acute exacerbation on exam. CHF exacerbation is a better explanation of her SOB. She is on 3L of O2 nasal canula here and at home. She does not take disease modifying agents, only albuterol.  -continue O2 3L [place]  5) Anemia: Her hemoglobin is 9.5 with a MCV of 90.8 indicating a mild normocytic anemia. She has no signs/symptoms of acute blood loss (denies hematuria, melena, hematochezia), she appears well perfused (warm). She may be experiencing a hemolytic process due to her CKD or potentially decreased red cell production due to loss of appropriate epo production and bone marrow stimulation. We will monitor and give Fe supplementation.  -continue home Fe  6) HTN - soft on presentation, but unable to get upper arm pressure due to obesity. Home meds:carvedilol, diltiazem, lasix, metolazone. We will continue her carvedilol and lasix, which should provide appropriate HTN control for now until we hear back from cardiology consult. We will consider IV metoprolol as a beta blocker if her HR becomes greater than 120.  -carvidelol 6.25 BID -IV lasix 80mg  BID -PRN IV metoprolol (if HR>120)  7) obstructive  sleep apnea: Ms. Servidio has diagnosed obstructive sleep apnea, exacerbated by obesity hypo=ventilation syndrome. She does not wear a cPAP at home which exacerbates her HTN and in turn her CHF due to metabolic acidosis and CO2 buildup. She needs to be on BiPAP every night, but she states she is intolerent of the mask. We will offer her BiPAP during this admission. -BiPAP   DVT PPx- low molecular weight heparin Code  Status- discussed with PT, Full code Consults placed- cardiology  Discharge: Admit patient with expected stay greater than 2 midnights.   This is a Careers information officer Note.  The care of the patient was discussed with Dr. Gay Filler and the assessment and plan was formulated with their assistance.  Please see their note for official documentation of the patient encounter.   Signed: Alta Corning, Medical Student 12/26/2016, 5:35 PM

## 2016-12-26 NOTE — ED Provider Notes (Signed)
[facility] DEPT Provider Note   CSN: [id number] Arrival date & time: 12/26/16  1444     History   Chief Complaint Chief Complaint  Patient presents with  . Respiratory Distress    HPI Paige Marshall is a 61 y.o. female PMH COPD (3L [place] baseline), CKD III, HTN, morbid obesity, Afib (on coumadin), CHF presents with one day dyspnea at rest and abdominal swelling. She reports lasix was discontinued four days ago, however clinic note 3/14 states continue 80 mg Lasix BID with 2.5 mg daily metolazone. Dyspnea started this morning. Denies fever, chills, cough, N/V.   The history is provided by the patient, medical records and a relative.  Shortness of Breath  This is a recurrent problem. The average episode lasts 8 hours. The problem occurs continuously.The current episode started 6 to 12 hours ago. The problem has been gradually worsening. Associated symptoms include abdominal pain and leg swelling. Pertinent negatives include no fever, no headaches, no rhinorrhea, no sore throat, no cough, no sputum production, no wheezing, no chest pain, no syncope, no vomiting, no leg pain and no claudication. Risk factors: discontinued lasix. She has had prior hospitalizations. She has had prior ED visits. Associated medical issues include COPD and heart failure.    Past Medical History:  Diagnosis Date  . Arthritis    "both knees" (01/14/2015)  . Asthma   . Chronic combined systolic and diastolic CHF (congestive heart failure) ([place])   . CKD (chronic kidney disease), stage III   . COPD (chronic obstructive pulmonary disease) ([place])   . Degenerative joint disease of both lower legs   . Edema    Lower extremity  . Former smoker   . Gout    hx in "both knees" (01/14/2015)  . H. pylori infection 01/03/2014   +breath test.    . High cholesterol   . Hypertension   . Morbid obesity ([place])   . NICM (nonischemic cardiomyopathy) ([place])    a. LHC 01/2015: normal cors, EF 45-50%. b. EF 50-55% in 08/2016.    [name] Nonischemic cardiomyopathy ([place])   . Normal coronary arteries   . OSA (obstructive sleep apnea)    "never sent CPAP out" (01/14/2015)  . Paroxysmal atrial fibrillation (HCC)   . Pneumonia X 2  . Pulmonary hypertension   . Respiratory failure ([place])   . Rheumatoid arthritis ([place])   . Stroke [facility])     Patient Active Problem List   Diagnosis Date Noted  . Acute on chronic respiratory failure ([place]) 11/07/2016  . Acute on chronic congestive heart failure ([place]) 11/07/2016  . Acute renal failure superimposed on chronic kidney disease ([place])   . Acute respiratory failure with hypoxemia ([place]) 09/18/2016  . Orthostatic hypotension   . Bradycardia   . Acute on chronic combined systolic and diastolic CHF (congestive heart failure) ([place])   . Anemia 09/06/2016  . Left fibular fracture 09/06/2016  . SOB (shortness of breath)   . Acute kidney injury superimposed on CKD ([place]) 08/31/2016  . NICM (nonischemic cardiomyopathy) ([place]) 08/31/2016  . Pulmonary hypertension 08/31/2016  . Right heart failure, NYHA class 3 08/31/2016  . Acute on chronic diastolic CHF (congestive heart failure) ([place]) 08/29/2016  . 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 Syncope   . AKI (acute kidney injury) ([place]) 03/08/2016  . Acute on chronic renal failure ([place]) 03/08/2016  . Chronic respiratory failure with hypoxia ([place]) 10/24/2015  . COPD mixed type ([place]) 10/10/2015  . Benign hypertensive  heart and kidney disease with CHF, NYHA class 2 and CKD stage 3 ([place]) 01/21/2015  . Uncontrolled hypertension   . Obesity hypoventilation syndrome ([place]) 01/14/2015  . Obstructive sleep apnea 09/28/2013  . Morbid obesity ([place]) 09/28/2013  . PAF (paroxysmal atrial fibrillation) ([place]) 01/09/2010  . Dyslipidemia 10/17/2006  . Essential hypertension 10/17/2006  . VENTRICULAR HYPERTROPHY, LEFT 10/17/2006  . DYSFUNCTIONAL UTERINE BLEEDING 10/17/2006    Past Surgical History:  Procedure Laterality  Date  . ANGIOGRAM/LV (CONGENITAL)  2007  . BREATH TEK H PYLORI N/A 12/31/2013   Procedure: [place];  Surgeon: Gayland Curry, MD;  Location: [facility];  Service: General;  Laterality: N/A;  . CORNEAL TRANSPLANT Right ~ 2010  . CORONARY ANGIOPLASTY WITH STENT PLACEMENT  11/04/2005   "1"  . DOPPLER ECHOCARDIOGRAPHY     2 D   EF of 45%  . EYE SURGERY    . LEFT HEART CATHETERIZATION WITH CORONARY ANGIOGRAM N/A 01/20/2015   Procedure: LEFT HEART CATHETERIZATION WITH CORONARY ANGIOGRAM;  Surgeon: Lorretta Harp, MD;  Location: [facility] CATH LAB;  Service: Cardiovascular;  Laterality: N/A;  . sleep study  2011  . stress myocardial dipyridamole perfusion    . TUBAL LIGATION  1982  . venous duplex ultrasound  2013    OB History    No data available       Home Medications    Prior to Admission medications   Medication Sig Start Date End Date Taking? Authorizing Provider  ARTIFICIAL TEAR OP Apply 1 drop to eye every 6 (six) hours as needed (dry eyes).   Yes Historical Provider, MD  carvedilol (COREG) 6.25 MG tablet Take 1 tablet (6.25 mg total) by mouth 2 (two) times daily with a meal. 09/23/16  Yes Shanker Kristeen Mans, MD  diclofenac sodium (VOLTAREN) 1 % GEL Apply 4 g topically every 6 (six) hours as needed (pain).    Yes Historical Provider, MD  diltiazem (DILACOR XR) 180 MG 24 hr capsule Take 180 mg by mouth daily.   Yes Historical Provider, MD  docusate sodium (COLACE) 100 MG capsule Take 1 capsule (100 mg total) by mouth 2 (two) times daily. 09/14/16  Yes Eugenie Filler, MD  ferrous sulfate 325 (65 FE) MG tablet Take 1 tablet (325 mg total) by mouth 3 (three) times daily with meals. 09/23/16  Yes Shanker Kristeen Mans, MD  furosemide (LASIX) 40 MG tablet Take 1 tablet (40 mg total) by mouth 2 (two) times daily. Patient taking differently: Take 80 mg by mouth 2 (two) times daily.  11/13/16  Yes Theodis Blaze, MD  metolazone (ZAROXOLYN) 2.5 MG tablet Take 1 tablet (2.5 mg total) by  mouth daily. 12/15/16 03/15/17 Yes Lorretta Harp, MD  Multiple Vitamin (MULTIVITAMIN WITH MINERALS) TABS tablet Take 1 tablet by mouth daily.   Yes Historical Provider, MD  omega-3 acid ethyl esters (LOVAZA) 1 g capsule Take 1 g by mouth 2 (two) times daily.   Yes Historical Provider, MD  oxyCODONE (OXY IR/ROXICODONE) 5 MG immediate release tablet Take 1 tablet (5 mg total) by mouth every 6 (six) hours as needed for breakthrough pain. 11/13/16  Yes Theodis Blaze, MD  polyethylene glycol [facility] Floria Raveling) packet Take 17 g by mouth daily as needed. Patient taking differently: Take 17 g by mouth daily as needed (constipation). Mix in 8 oz liquid and drink 09/23/16  Yes Shanker Kristeen Mans, MD  potassium chloride SA (K-DUR,KLOR-CON) 20 MEQ tablet Take 40 mEq by mouth  3 (three) times daily.    Yes Historical Provider, MD  predniSONE (DELTASONE) 10 MG tablet Take 40 mg tablet 11/14/2016 and taper down by 10 mg daily until completed 11/13/16  Yes Theodis Blaze, MD  PROAIR HFA 108 [phone] Base) MCG/ACT inhaler INHALE 2 PUFFS EVERY 6 HOURS AS NEEDED FOR WHEEZING OR SHORTNESS OF BREATH. 11/12/16  Yes Deneise Lever, MD  promethazine (PHENERGAN) 25 MG tablet Take 25 mg by mouth every 6 (six) hours as needed for nausea or vomiting.   Yes Historical Provider, MD  propafenone (RYTHMOL) 150 MG tablet Take 1 tablet (150 mg total) by mouth every 8 (eight) hours. 11/13/16  Yes Theodis Blaze, MD  senna-docusate (SENNA S) 8.6-50 MG tablet Take 2 tablets by mouth See admin instructions. Take 2 tablet by mouth daily at bedtime, may also take 2 tablets as needed for constipation   Yes Historical Provider, MD  warfarin (COUMADIN) 5 MG tablet Take 5 mg by mouth daily.   Yes Historical Provider, MD  zolpidem (AMBIEN) 5 MG tablet Take 5 mg by mouth at bedtime as needed for sleep.   Yes Historical Provider, MD  bisacodyl (DULCOLAX) 5 MG EC tablet Take 1 tablet (5 mg total) by mouth daily as needed for moderate constipation. Patient not  taking: Reported on 12/26/2016 11/13/16   Theodis Blaze, MD  PRESCRIPTION MEDICATION Inhale into the lungs at bedtime. BIPAP    Historical Provider, MD    Family History Family History  Problem Relation Age of Onset  . Heart disease Mother   . Cancer Father     colon  . Hypertension Other     Social History Social History  Substance Use Topics  . Smoking status: Former Smoker    Packs/day: 0.33    Years: 35.00    Types: Cigarettes    Quit date: 10/11/2008  . Smokeless tobacco: Never Used  . Alcohol use No     Comment: 01/14/2015 "~ 4, 24oz cans beer/wk"     Allergies   Penicillins and Citrus   Review of Systems Review of Systems  Constitutional: Positive for fatigue. Negative for fever.  HENT: Negative for rhinorrhea and sore throat.   Respiratory: Positive for shortness of breath. Negative for cough, sputum production and wheezing.   Cardiovascular: Positive for leg swelling. Negative for chest pain, claudication and syncope.  Gastrointestinal: Positive for abdominal pain. Negative for nausea and vomiting.  Neurological: Negative for headaches.  All other systems reviewed and are negative.    Physical Exam Updated Vital Signs BP 114/73 (BP Location: Right Arm)   Pulse (!) 113   Temp 97.5 F (36.4 C) (Oral)   Resp (!) 22   Ht 5' 7.5" (1.715 m)   Wt (!) 184.8 kg Comment: bed scale  LMP 09/07/2011   SpO2 100%   BMI 62.88 kg/m   Physical Exam  Constitutional: She is oriented to person, place, and time. She appears well-developed and well-nourished. No distress.  Morbidly obese  HENT:  Head: Normocephalic and atraumatic.  Eyes: Conjunctivae and EOM are normal.  Neck: Normal range of motion. Neck supple.  Cardiovascular: Normal rate.  An irregularly irregular rhythm present.  No murmur heard. Pulmonary/Chest: Effort normal. No accessory muscle usage. No tachypnea. No respiratory distress. She has rales in the right lower field and the left lower field.  Speaks  in 2-3 word sentences  Abdominal: Soft. There is no tenderness.  Musculoskeletal: Normal range of motion. She exhibits edema (2+ pitting edema  to b/l LE).  Neurological: She is alert and oriented to person, place, and time.  Skin: Skin is warm and dry. Capillary refill takes less than 2 seconds.  Psychiatric: She has a normal mood and affect.  Nursing note and vitals reviewed.    ED Treatments / Results  Labs (all labs ordered are listed, but only abnormal results are displayed) Labs Reviewed  CBC WITH DIFFERENTIAL/PLATELET - Abnormal; Notable for the following:       Result Value   WBC 11.1 (*)    RBC 3.81 (*)    Hemoglobin 9.5 (*)    HCT 34.6 (*)    MCH 24.9 (*)    MCHC 27.5 (*)    RDW 24.5 (*)    Neutro Abs 8.1 (*)    All other components within normal limits  BASIC METABOLIC PANEL - Abnormal; Notable for the following:    Potassium 6.3 (*)    Glucose, Bld 100 (*)    BUN 61 (*)    Creatinine, Ser 3.03 (*)    GFR calc non Af Amer 16 (*)    GFR calc Af Amer 18 (*)    All other components within normal limits  BRAIN NATRIURETIC PEPTIDE - Abnormal; Notable for the following:    B Natriuretic Peptide 1,116.0 (*)    All other components within normal limits  BASIC METABOLIC PANEL - Abnormal; Notable for the following:    Potassium 6.2 (*)    Chloride 100 (*)    Glucose, Bld 108 (*)    BUN 62 (*)    Creatinine, Ser 3.22 (*)    GFR calc non Af Amer 15 (*)    GFR calc Af Amer 17 (*)    All other components within normal limits  BASIC METABOLIC PANEL  URINALYSIS, ROUTINE W REFLEX MICROSCOPIC  CBC  I-STAT TROPOININ, ED  CBG MONITORING, ED    EKG  EKG Interpretation  Date/Time:  Sunday December 26 2016 15:27:09 EDT Ventricular Rate:  115 PR Interval:    QRS Duration: 174 QT Interval:  401 QTC Calculation: 555 R Axis:   -177 Text Interpretation:  Atrial fibrillation Nonspecific intraventricular conduction delay Confirmed by Jeneen Rinks  MD, [place] ([id number]) on 12/26/2016 3:42:41  PM       Radiology US Renal  Result Date: 12/26/2016 CLINICAL DATA:  Acute on chronic renal failure EXAM: RENAL / URINARY TRACT ULTRASOUND COMPLETE COMPARISON:  None. FINDINGS: Right Kidney: Length: 12.1 cm. Echogenicity within normal limits. No mass or hydronephrosis visualized. Left Kidney: Length: 11.1 cm. Echogenicity within normal limits. No mass or hydronephrosis visualized. Bladder: The bladder is nondistended. The patient voided immediately before the examination. Other: Moderate volume of lower abdominal ascites. IMPRESSION: 1. Normal appearance of the kidneys. 2. Moderate lower abdominal ascites. Electronically Signed   By: Ulyses Jarred M.D.   On: 12/26/2016 21:53   Dg Chest Port 1 View  Result Date: 12/26/2016 CLINICAL DATA:  Dyspnea, respiratory distress. EXAM: PORTABLE CHEST 1 VIEW COMPARISON:  11/07/2016 FINDINGS: Patient rotated to the right. Lungs are adequately inflated demonstrate prominent perihilar markings suggesting mild vascular congestion. Stable hazy left base/ retrocardiac opacification. Moderate stable cardiomegaly. Calcified plaque over the aortic arch. Remainder of the exam is unchanged. IMPRESSION: Moderate stable cardiomegaly with suggestion of vascular congestion. Could not exclude effusions/atelectasis versus infection in the left base. Electronically Signed   By: Marin Olp M.D.   On: 12/26/2016 15:30    Procedures Procedures (including critical care time)  Medications Ordered in ED Medications  zolpidem (AMBIEN) tablet 5 mg (not administered)  oxyCODONE (Oxy IR/ROXICODONE) immediate release tablet 5 mg (not administered)  promethazine (PHENERGAN) tablet 25 mg (not administered)  propafenone (RYTHMOL) tablet 150 mg (150 mg Oral Given 12/26/16 2125)  senna-docusate (Senokot-S) tablet 2 tablet (not administered)  ferrous sulfate tablet 325 mg (not administered)  polyethylene glycol (MIRALAX / GLYCOLAX) packet 17 g (not administered)  sodium chloride flush  (NS) 0.9 % injection 3 mL (3 mLs Intravenous Given 12/26/16 2131)  sodium chloride flush (NS) 0.9 % injection 3 mL (not administered)  0.9 %  sodium chloride infusion (not administered)  acetaminophen (TYLENOL) tablet 650 mg (not administered)  ondansetron (ZOFRAN) injection 4 mg (not administered)  diltiazem (DILACOR XR) 24 hr capsule 180 mg (not administered)  furosemide (LASIX) injection 80 mg (not administered)  furosemide (LASIX) injection 80 mg (80 mg Intravenous Given 12/26/16 1512)  ondansetron (ZOFRAN) injection 4 mg (4 mg Intravenous Given 12/26/16 1555)  oxyCODONE (Oxy IR/ROXICODONE) immediate release tablet 5 mg (5 mg Oral Given 12/26/16 1555)  insulin aspart (novoLOG) injection 5 Units (5 Units Intravenous Given 12/26/16 1744)  dextrose 50 % solution 50 mL (50 mLs Intravenous Given 12/26/16 1744)     Initial Impression / Assessment and Plan / ED Course  I have reviewed the triage vital signs and the nursing notes.  Pertinent labs & imaging results that were available during my care of the patient were reviewed by me and considered in my medical decision making (see chart for details).     61 y.o. female presents for shortness of breath and leg swelling c/w volume overload. VSS. Stable on baseline 3L [place]. - Given 80 mg IV lasix - CXR mild vascular congestion, no focal consolidation - EKG Afib, HR 115, c/w previous studies - K 6.3, given insulin/dextrose - AKI, Cr 3.03 from 1.97 Admitted for further care.   Discussed with my attending physician, Dr Reather Converse  Final Clinical Impressions(s) / ED Diagnoses   Final diagnoses:  Acute on chronic renal failure [facility])    New Prescriptions Current Discharge Medication List       Monico Blitz, MD 12/26/16 1751    Elnora Morrison, MD 12/27/16 0020

## 2016-12-26 NOTE — Consult Note (Signed)
CARDIOLOGY CONSULT NOTE   [name] MRN: [id number] DOB/AGE: October 14, 1955 61 y.o.  Admit date: 12/26/2016  Requesting Physician: Primary Physician:   Kerin Perna, NP Primary Cardiologist:  Gwenlyn Found Reason for Consultation:  Acute systolic heart failure  HPI: Paige Marshall is a 61 y.o. female with a PMH significant for morbid obesity (dry weight approximately 370lbs), NICM leading to HFrEF (LVEF=45%), PAF on propafenone and carvedilol/diltiazem for rate control who is admitted to the internal medicine service with SOB/orthopnea/increased LE edema thought to be 2/2 acute systolic heart failure.  Paige Marshall had been managed with intravenous furosemide while she had a PICC line in place but was then converted to oral diuretics after the line was removed on 3/12, although there is some documentation that there may have been a 3-4 day period of missed diuretics prior to this transition.  She reports that her lower extremity edema and abdominal fullness have increased significantly over recent days, and she now weighs over 410lbs.  She also endorses SOB at rest and required supplemental oxygen this morning when normally she only needs it during sleep.  On arrival to the ED she was noted to be hemodynamically stable but in mild respiratory distress.  Her dyspnea greatly improved after receiving a dose of lasix 80mg  IV x 1.  She is currently laying comfortably in bed and denies SOB.   Past Medical History:  Diagnosis Date  . Arthritis    "both knees" (01/14/2015)  . Asthma   . Chronic combined systolic and diastolic CHF (congestive heart failure) ([place])   . CKD (chronic kidney disease), stage III   . COPD (chronic obstructive pulmonary disease) ([place])   . Degenerative joint disease of both lower legs   . Edema    Lower extremity  . Former smoker   . Gout    hx in "both knees" (01/14/2015)  . H. pylori infection 01/03/2014   +breath test.    . High cholesterol    . Hypertension   . Morbid obesity ([place])   . NICM (nonischemic cardiomyopathy) ([place])    a. LHC 01/2015: normal cors, EF 45-50%. b. EF 50-55% in 08/2016.  [name] Nonischemic cardiomyopathy ([place])   . Normal coronary arteries   . OSA (obstructive sleep apnea)    "never sent CPAP out" (01/14/2015)  . Paroxysmal atrial fibrillation (H[facility])   . Pneumonia X 2  . Pulmonary hypertension   . Respiratory failure ([place])   . Rheumatoid arthritis ([place])   . Stroke [facility])      Past Surgical History:  Procedure Laterality Date  . ANGIOGRAM/LV (CONGENITAL)  2007  . BREATH TEK H PYLORI N/A 12/31/2013   Procedure: [place];  Surgeon: Gayland Curry, MD;  Location: [facility];  Service: General;  Laterality: N/A;  . CORNEAL TRANSPLANT Right ~ 2010  . CORONARY ANGIOPLASTY WITH STENT PLACEMENT  11/04/2005   "1"  . DOPPLER ECHOCARDIOGRAPHY     2 D   EF of 45%  . EYE SURGERY    . LEFT HEART CATHETERIZATION WITH CORONARY ANGIOGRAM N/A 01/20/2015   Procedure: LEFT HEART CATHETERIZATION WITH CORONARY ANGIOGRAM;  Surgeon: Lorretta Harp, MD;  Location: [facility] CATH LAB;  Service: Cardiovascular;  Laterality: N/A;  . sleep study  2011  . stress myocardial dipyridamole perfusion    . TUBAL LIGATION  1982  . venous duplex ultrasound  2013    Allergies  Allergen Reactions  . Penicillins Hives, Itching, Swelling  and Other (See Comments)    Tongue swelling Has patient had a PCN reaction causing immediate rash, facial/tongue/throat swelling, SOB or lightheadedness with hypotension: Yes  Clarified with the patient her penicillin allergy. Pt has tolerated cefepime in the past. Pt also states that she can take amoxicillin.    . Citrus Hives    I have reviewed the patient's current medications . [START ON 12/27/2016] diltiazem  180 mg Oral Daily  . [START ON 12/27/2016] ferrous sulfate  325 mg Oral TID WC  . [START ON 12/27/2016] furosemide  80 mg Intravenous BID  . propafenone  150 mg Oral Q8H  . senna-docusate   2 tablet Oral QHS  . sodium chloride flush  3 mL Intravenous Q12H    sodium chloride, acetaminophen, ondansetron (ZOFRAN) IV, oxyCODONE, polyethylene glycol, promethazine, sodium chloride flush, zolpidem  Prior to Admission medications   Medication Sig Start Date End Date Taking? Authorizing Provider  ARTIFICIAL TEAR OP Apply 1 drop to eye every 6 (six) hours as needed (dry eyes).   Yes Historical Provider, MD  carvedilol (COREG) 6.25 MG tablet Take 1 tablet (6.25 mg total) by mouth 2 (two) times daily with a meal. 09/23/16  Yes Shanker Kristeen Mans, MD  diclofenac sodium (VOLTAREN) 1 % GEL Apply 4 g topically every 6 (six) hours as needed (pain).    Yes Historical Provider, MD  diltiazem (DILACOR XR) 180 MG 24 hr capsule Take 180 mg by mouth daily.   Yes Historical Provider, MD  docusate sodium (COLACE) 100 MG capsule Take 1 capsule (100 mg total) by mouth 2 (two) times daily. 09/14/16  Yes Eugenie Filler, MD  ferrous sulfate 325 (65 FE) MG tablet Take 1 tablet (325 mg total) by mouth 3 (three) times daily with meals. 09/23/16  Yes Shanker Kristeen Mans, MD  furosemide (LASIX) 40 MG tablet Take 1 tablet (40 mg total) by mouth 2 (two) times daily. Patient taking differently: Take 80 mg by mouth 2 (two) times daily.  11/13/16  Yes Theodis Blaze, MD  metolazone (ZAROXOLYN) 2.5 MG tablet Take 1 tablet (2.5 mg total) by mouth daily. 12/15/16 03/15/17 Yes Lorretta Harp, MD  Multiple Vitamin (MULTIVITAMIN WITH MINERALS) TABS tablet Take 1 tablet by mouth daily.   Yes Historical Provider, MD  omega-3 acid ethyl esters (LOVAZA) 1 g capsule Take 1 g by mouth 2 (two) times daily.   Yes Historical Provider, MD  oxyCODONE (OXY IR/ROXICODONE) 5 MG immediate release tablet Take 1 tablet (5 mg total) by mouth every 6 (six) hours as needed for breakthrough pain. 11/13/16  Yes Theodis Blaze, MD  polyethylene glycol [facility] / Floria Raveling) packet Take 17 g by mouth daily as needed. Patient taking differently: Take 17 g by  mouth daily as needed (constipation). Mix in 8 oz liquid and drink 09/23/16  Yes Shanker Kristeen Mans, MD  potassium chloride SA (K-DUR,KLOR-CON) 20 MEQ tablet Take 40 mEq by mouth 3 (three) times daily.    Yes Historical Provider, MD  predniSONE (DELTASONE) 10 MG tablet Take 40 mg tablet 11/14/2016 and taper down by 10 mg daily until completed 11/13/16  Yes Theodis Blaze, MD  PROAIR HFA 108 [id number] Base) MCG/ACT inhaler INHALE 2 PUFFS EVERY 6 HOURS AS NEEDED FOR WHEEZING OR SHORTNESS OF BREATH. 11/12/16  Yes Deneise Lever, MD  promethazine (PHENERGAN) 25 MG tablet Take 25 mg by mouth every 6 (six) hours as needed for nausea or vomiting.   Yes Historical Provider, MD  propafenone (RYTHMOL)  150 MG tablet Take 1 tablet (150 mg total) by mouth every 8 (eight) hours. 11/13/16  Yes Theodis Blaze, MD  senna-docusate (SENNA S) 8.6-50 MG tablet Take 2 tablets by mouth See admin instructions. Take 2 tablet by mouth daily at bedtime, may also take 2 tablets as needed for constipation   Yes Historical Provider, MD  warfarin (COUMADIN) 5 MG tablet Take 5 mg by mouth daily.   Yes Historical Provider, MD  zolpidem (AMBIEN) 5 MG tablet Take 5 mg by mouth at bedtime as needed for sleep.   Yes Historical Provider, MD  bisacodyl (DULCOLAX) 5 MG EC tablet Take 1 tablet (5 mg total) by mouth daily as needed for moderate constipation. Patient not taking: Reported on 12/26/2016 11/13/16   Theodis Blaze, MD  PRESCRIPTION MEDICATION Inhale into the lungs at bedtime. BIPAP    Historical Provider, MD     Social History   Social History  . Marital status: Widowed    Spouse name: N/A  . Number of children: N/A  . Years of education: N/A   Occupational History  . Not on file.   Social History Main Topics  . Smoking status: Former Smoker    Packs/day: 0.33    Years: 35.00    Types: Cigarettes    Quit date: 10/11/2008  . Smokeless tobacco: Never Used  . Alcohol use No     Comment: 01/14/2015 "~ 4, 24oz cans beer/wk"  . Drug use: No    . Sexual activity: No   Other Topics Concern  . Not on file   Social History Narrative  . No narrative on file    Family Status  Relation Status  . Mother Deceased  . Father Deceased  . Other   . Maternal Grandmother Deceased  . Maternal Grandfather Deceased  . Paternal Grandmother Deceased  . Paternal Grandfather Deceased   Family History  Problem Relation Age of Onset  . Heart disease Mother   . Cancer Father     colon  . Hypertension Other     ROS:  Full 14 point review of systems complete and found to be negative unless listed above.  Physical Exam: Blood pressure 114/73, pulse (!) 113, temperature 97.5 F (36.4 C), temperature source Oral, resp. rate (!) 22, height 5' 7.5" (1.715 m), weight (!) 184.8 kg (407 lb 8 oz), last menstrual period 09/07/2011, SpO2 100 %.  General: morbidly obese, NAD, AAOx3 Head: Eyes PERRLA, No xanthomas.   Normocephalic and atraumatic, oropharynx without edema or exudate. Dentition:  Lungs: limited air movement bilaterally, bibasilar inspiratory crackles Heart: tachycardic, irregular rhythm, +S1 +S2, no appreciable m/r/g, no appreciable JVD although significant neck girth, tense LE to the hip bilaterally.   Neck: No carotid bruits. No lymphadenopathy.  JVD. Abdomen: obese, mildly distended, NTTP Msk:  No spine or cva tenderness. No weakness, no joint deformities or effusions. Extremities: No clubbing or cyanosis.  edema.  Neuro: Alert and oriented X 3. No focal deficits noted. Psych:  Good affect, responds appropriately Skin: No rashes or lesions noted.  Labs:   Lab Results  Component Value Date   WBC 11.1 (H) 12/26/2016   HGB 9.5 (L) 12/26/2016   HCT 34.6 (L) 12/26/2016   MCV 90.8 12/26/2016   PLT 304 12/26/2016   No results for input(s): INR in the last 72 hours.  Recent Labs Lab 12/26/16 1502  NA 143  K 6.3*  CL 101  CO2 28  BUN 61*  CREATININE 3.03*  CALCIUM  9.3  GLUCOSE 100*   Magnesium  Date Value Ref Range  Status  09/23/2016 2.4 1.7 - 2.4 mg/dL Final   No results for input(s): CKTOTAL, CKMB, TROPONINI in the last 72 hours.  Recent Labs  12/26/16 1507  TROPIPOC 0.01   Pro B Natriuretic peptide (BNP)  Date/Time Value Ref Range Status  11/16/2009 12:30 AM 218.0 (H) 0.0 - 100.0 pg/mL Final  10/02/2009 04:35 PM 53.0 0.0 - 100.0 pg/mL Final   Lab Results  Component Value Date   CHOL 167 01/15/2015   HDL 35 (L) 01/15/2015   LDLCALC 93 01/15/2015   TRIG 194 (H) 01/15/2015   Lab Results  Component Value Date   DDIMER 1.63 (H) 08/29/2016   Lipase  Date/Time Value Ref Range Status  03/08/2016 06:21 AM 27 11 - 51 U/L Final   TSH  Date/Time Value Ref Range Status  11/19/2013 11:20 AM 1.542 0.350 - 4.500 uIU/mL Final   Vitamin B-12  Date/Time Value Ref Range Status  11/09/2016 02:01 PM 1,315 (H) 180 - 914 pg/mL Final    Comment:    (NOTE) This assay is not validated for testing neonatal or myeloproliferative syndrome specimens for Vitamin B12 levels.    Folate  Date/Time Value Ref Range Status  11/09/2016 02:01 PM 17.5 >5.9 ng/mL Final   Ferritin  Date/Time Value Ref Range Status  11/09/2016 02:01 PM 199 11 - 307 ng/mL Final   TIBC  Date/Time Value Ref Range Status  11/09/2016 02:01 PM 241 (L) 250 - 450 ug/dL Final   Iron  Date/Time Value Ref Range Status  11/09/2016 02:01 PM 25 (L) 28 - 170 ug/dL Final   Retic Ct Pct  Date/Time Value Ref Range Status  11/09/2016 02:01 PM 2.0 0.4 - 3.1 % Final    Echo: 09/19/16 - Left ventricle: The cavity size was normal. Wall thickness was   increased in a pattern of moderate LVH. Systolic function was   normal. The estimated ejection fraction was in the range of 50%   to 55%. Wall motion was normal; there were no regional wall   motion abnormalities. - Ventricular septum: The contour showed diastolic flattening. - Aorta: Aortic root dimension: 40 mm (ED). - Left atrium: The atrium was moderately dilated. - Right ventricle:  The cavity size was moderately dilated. Wall   thickness was normal. Systolic function was mildly reduced. - Right atrium: The atrium was severely dilated. - Tricuspid valve: There was moderate regurgitation. - Pulmonary arteries: Systolic pressure was moderately increased.   PA peak pressure: 49 mm Hg (S).   ECG:  Per my interpretation, tonight's tracing shows atrial fibrillation with controlled ventricular response, NIVCD, no acute ischemic changes.  Radiology:  Dg Chest Port 1 View  Result Date: 12/26/2016 CLINICAL DATA:  Dyspnea, respiratory distress. EXAM: PORTABLE CHEST 1 VIEW COMPARISON:  11/07/2016 FINDINGS: Patient rotated to the right. Lungs are adequately inflated demonstrate prominent perihilar markings suggesting mild vascular congestion. Stable hazy left base/ retrocardiac opacification. Moderate stable cardiomegaly. Calcified plaque over the aortic arch. Remainder of the exam is unchanged. IMPRESSION: Moderate stable cardiomegaly with suggestion of vascular congestion. Could not exclude effusions/atelectasis versus infection in the left base. Electronically Signed   By: Marin Olp M.D.   On: 12/26/2016 15:30    ASSESSMENT AND PLAN:    Principal Problem:   Acute on chronic combined systolic and diastolic CHF (congestive heart failure) (HCC) Active Problems:   Essential hypertension   PAF (paroxysmal atrial fibrillation) (HCC)   Obstructive sleep  apnea   Morbid obesity ([place])   Obesity hypoventilation syndrome ([place])   COPD mixed type (HCC)   Acute kidney injury superimposed on CKD ([place])  The pt is 61 y.o. female with a PMH significant for morbid obesity (dry weight approximately 370lbs), NICM leading to HFrEF (LVEF=45%), PAF on propafenone and carvedilol/diltiazem for rate control who is admitted with acute systolic heart failure and AKI.  Her respiratory status is improved following a dose of IV lasix and she currently appears comfortable.  Would suggest diuretic plan as  per below while carefully watching GFR which hopefully with improved with removal of intravascular volume and improved effective arterial blood flow.  # Acute systolic heart failure; report of recent missed diuretic dosing at rehab facility, now with significant weight gain, elevated BNP, and hypervolemic on exam - Lasix 80mg  IV BID with goal -1.5L/24H - may add additional metolazone 2.5mg  PO if I/O trend not likely to reach -1.5L goal - continue carvedilol at current dosing schedule - no ACEI given GFR  # Afib; adequately rate-controlled, asymptomatic - continue carvedilol and diltiazem at current dosing schedule - continue propafenone for now, will defer decision to continue long term to primary cardiologist - INR supratherapeutic, okay to hold warfarin for now  # AKI on CKD; likely 2/2 decreased effective arterial blood flow in the setting of acute heart failure, suggest diuresis as per above with careful watch on daily GFR - suggest urine lytes/urea to calculate FeUrea  # Hyperkalemia; K=6.1, no ECG changes - lasix as per above - kayexelate/insulin/beta-agonists as per primary team  # HTN;  - carvedilol, diltiazem, lasix as per above  Signed: Clayborne Dana, MD 12/26/2016 9:38 PM

## 2016-12-26 NOTE — ED Notes (Signed)
Report given to Jeanie Cooks RN

## 2016-12-26 NOTE — ED Triage Notes (Signed)
Pt sent from rehab facility for increase fluid and sob that started the last night. Pt was recently taken off lasix due to acute kidney failure. Pt is now have swelling and shortness of breath. Pt arrives by [facility].

## 2016-12-26 NOTE — Telephone Encounter (Signed)
Pt called ans svc c/o increasing edema and SOB.  Called back, SOB had gotten worse.  EMS on the scene, pt to be tx to [facility].  Agree with plan.  Rosaria Ferries, PA-C 12/26/2016 2:18 PM Beeper [phone]

## 2016-12-26 NOTE — ED Provider Notes (Signed)
Medical Screening exam:  Patient arrives at 1448 PM. Triage note states "respiratory distress". Patient is a rehabilitation facility since November. History of congestive heart failure, morbid obesity. Is on Lasix 80 mg twice per day. Had elevation of her creatinine per patient report on Thursday. Her Lasix was stopped. States that progressive swelling and dyspnea since that time and presents here via ambulance.  Saturating 100% on 2 L nasal cannula. She is dyspneic with conversation. Morbid obesity and marked dependent edema. Crackles at the bases and globally diminished breath sounds. Sinus rhythm. I have ordered x-rays, lab, EKG, Hep-Lock, Lasix. Evaluation and treatment to be completed by a different provider.     Tanna Furry, MD 12/26/16 [phone]

## 2016-12-26 NOTE — ED Notes (Signed)
ED Provider at bedside. 

## 2016-12-26 NOTE — H&P (Signed)
Date: 12/26/2016               Patient Name:  MAKYNZI Marshall MRN: [id number]  DOB: 1956/06/29 Age / Sex: 61 y.o., female   PCP: Kerin Perna, NP         Medical Service: Internal Medicine Teaching Service         Attending Physician: Dr. Lucious Groves, DO    First Contact: Dr. Holley Raring Pager: [phone]  Second Contact: Dr. Ignacia Marvel Pager: [phone]       After Hours (After 5p /  First Contact Pager: [phone]  Weekends / Holidays): Second Contact Pager: [phone]   Chief Complaint: SOB  History of Present Illness: [name] is a 61 y.o. 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  Patient has had a long history of poorly controlled heart failure, treated by multiple episodes of hypervolemia. She has a baseline poor renal function which is completed her therapy. For the last several months patient has been living in a skilled nursing facility after she suffered a broken fibula last year, and she has had multiple admissions from this facility to the hospital for acute on chronic heart failure exacerbations. Was recently she's been followed by Dr. Alvester Chou her cardiologist with concern of volume overload. Her diuretic regimen was increased to 80 mg IV twice a day with a creatinine ~2.0 at that point. 1 week later when she had follow-up, she was noted to have worsening kidney function which was thought to be due to her increased diuretic regimen. She was again transitioned to by mouth Lasix 80 mg twice a day on 12/20/2016. Patient reports that since that time she feels like she has been slowly gaining fluid. Over the last 2 days patient notes a 15 pound weight gain as recorded by daily weights. Last night patient reports that she had acute onset of shortness of breath. Today she is in mild respiratory distress sitting in her bed with nasal cannula  oxygen.  Patient denies any chest pain, palpitations. She reports that when she misses her antiarrhythmic medications she does experience palpitations but has not noted this recently. She denies any significant changes to urination. She reports that she tends to urinate more frequently when she is on higher doses of the Lasix but has not had any significant oliguria recently. Patient does also report some diffuse abdominal tenderness which she describes as tightness, "a band around her stomach."  In the ED pt was acutely SOB with tachypnea to 30 and tachycardia in the 110s. She was found to have hyperkalemia to 6.3 and AKI w/ SCr 3.03. She was temporized with IV Lasix and Insulin + Dextrose. BP remained stable. Her BNP was significantly elevated at 1100 up from her baseline of 200-400.  Meds: No current facility-administered medications for this encounter.    Current Outpatient Prescriptions  Medication Sig Dispense Refill  . ARTIFICIAL TEAR OP Apply 1 drop to eye every 6 (six) hours as needed (dry eyes).    . carvedilol (COREG) 6.25 MG tablet Take 1 tablet (6.25 mg total) by mouth 2 (two) times daily with a meal.    . diclofenac sodium (VOLTAREN) 1 % GEL Apply 4 g topically every 6 (six) hours as needed (pain).     [name] diltiazem (DILACOR XR) 180 MG 24 hr capsule Take 180 mg by mouth daily.    [name]  docusate sodium (COLACE) 100 MG capsule Take 1 capsule (100 mg total) by mouth 2 (two) times daily. 10 capsule 0  . ferrous sulfate 325 (65 FE) MG tablet Take 1 tablet (325 mg total) by mouth 3 (three) times daily with meals.  3  . furosemide (LASIX) 40 MG tablet Take 1 tablet (40 mg total) by mouth 2 (two) times daily. (Patient taking differently: Take 80 mg by mouth 2 (two) times daily. ) 60 tablet 0  . metolazone (ZAROXOLYN) 2.5 MG tablet Take 1 tablet (2.5 mg total) by mouth daily. 90 tablet 1  . Multiple Vitamin (MULTIVITAMIN WITH MINERALS) TABS tablet Take 1 tablet by mouth daily.    [name] omega-3 acid  ethyl esters (LOVAZA) 1 g capsule Take 1 g by mouth 2 (two) times daily.    [name] oxyCODONE (OXY IR/ROXICODONE) 5 MG immediate release tablet Take 1 tablet (5 mg total) by mouth every 6 (six) hours as needed for breakthrough pain. 15 tablet 0  . polyethylene glycol (MIRALAX / GLYCOLAX) packet Take 17 g by mouth daily as needed. (Patient taking differently: Take 17 g by mouth daily as needed (constipation). Mix in 8 oz liquid and drink) 14 each 0  . potassium chloride SA (K-DUR,KLOR-CON) 20 MEQ tablet Take 40 mEq by mouth 3 (three) times daily.     . predniSONE (DELTASONE) 10 MG tablet Take 40 mg tablet 11/14/2016 and taper down by 10 mg daily until completed 15 tablet 0  . PROAIR HFA 108 (90 Base) MCG/ACT inhaler INHALE 2 PUFFS EVERY 6 HOURS AS NEEDED FOR WHEEZING OR SHORTNESS OF BREATH. 8.5 Inhaler 1  . promethazine (PHENERGAN) 25 MG tablet Take 25 mg by mouth every 6 (six) hours as needed for nausea or vomiting.    . propafenone (RYTHMOL) 150 MG tablet Take 1 tablet (150 mg total) by mouth every 8 (eight) hours. 10 tablet 0  . senna-docusate (SENNA S) 8.6-50 MG tablet Take 2 tablets by mouth See admin instructions. Take 2 tablet by mouth daily at bedtime, may also take 2 tablets as needed for constipation    . warfarin (COUMADIN) 5 MG tablet Take 5 mg by mouth daily.    [name] zolpidem (AMBIEN) 5 MG tablet Take 5 mg by mouth at bedtime as needed for sleep.    . bisacodyl (DULCOLAX) 5 MG EC tablet Take 1 tablet (5 mg total) by mouth daily as needed for moderate constipation. (Patient not taking: Reported on 12/26/2016) 30 tablet 0  . PRESCRIPTION MEDICATION Inhale into the lungs at bedtime. BIPAP     Allergies: Allergies as of 12/26/2016 - Review Complete 12/26/2016  Allergen Reaction Noted  . Penicillins Hives, Itching, Swelling, and Other (See Comments) 12/08/2011  . Citrus Hives 04/04/2016   Past Medical History:  Diagnosis Date  . Arthritis    "both knees" (01/14/2015)  . Asthma   . Chronic combined  systolic and diastolic CHF (congestive heart failure) ([place])   . CKD (chronic kidney disease), stage III   . COPD (chronic obstructive pulmonary disease) ([place])   . Degenerative joint disease of both lower legs   . Edema    Lower extremity  . Former smoker   . Gout    hx in "both knees" (01/14/2015)  . H. pylori infection 01/03/2014   +breath test.    . High cholesterol   . Hypertension   . Morbid obesity ([place])   . NICM (nonischemic cardiomyopathy) ([place])    a. LHC 01/2015: normal cors, EF 45-50%. b.  EF 50-55% in 08/2016.  [name] Nonischemic cardiomyopathy ([place])   . Normal coronary arteries   . OSA (obstructive sleep apnea)    "never sent CPAP out" (01/14/2015)  . Paroxysmal atrial fibrillation (HCC)   . Pneumonia X 2  . Pulmonary hypertension   . Respiratory failure ([place])   . Rheumatoid arthritis ([place])   . Stroke [facility])    Family History: Pt family history includes Cancer in her father; Heart disease in her mother; Hypertension in her other.  Social History: Pt  reports that she quit smoking about 8 years ago. Her smoking use included Cigarettes. She has a 11.55 pack-year smoking history. She has never used smokeless tobacco. She reports that she does not drink alcohol or use drugs.  Review of Systems: A complete ROS was negative except as per HPI. Review of Systems  Constitutional: Negative for chills, fever and weight loss.  Eyes: Negative for blurred vision.  Respiratory: Positive for cough and shortness of breath.   Cardiovascular: Positive for palpitations, orthopnea and leg swelling. Negative for chest pain.  Gastrointestinal: Positive for abdominal pain. Negative for constipation, diarrhea, nausea and vomiting.  Genitourinary: Negative for dysuria, frequency and urgency.  Musculoskeletal: Negative for myalgias.  Skin: Negative for rash.  Neurological: Negative for dizziness, tremors and headaches.  Endo/Heme/Allergies: Negative for polydipsia.  Psychiatric/Behavioral: The  patient is not nervous/anxious.    Physical Exam: Vitals:   12/26/16 1545 12/26/16 1730 12/26/16 1746 12/26/16 1753  BP: (!) 131/99 (!) 85/62 90/73   Pulse:    (!) 33  Resp: (!) 29  19 19   Temp:      TempSrc:      SpO2:    97%   Physical Exam  Constitutional: She is oriented to person, place, and time. She appears well-developed. She is cooperative. She appears distressed (mild).  Morbidly obese female in mild distress lying in the bed.  HENT:  Head: Normocephalic and atraumatic.  Right Ear: Hearing normal.  Left Ear: Hearing normal.  Nose: Nose normal.  Mouth/Throat: Mucous membranes are normal.  Cardiovascular: Normal rate, regular rhythm, S1 normal, S2 normal and intact distal pulses.  Exam reveals no gallop.   No murmur heard. Pulmonary/Chest: She is in respiratory distress. She has no wheezes. She has no rhonchi. She has rales. She exhibits no tenderness. Breasts are symmetrical.  Abdominal: Soft. Normal appearance and bowel sounds are normal. She exhibits no ascites. There is no hepatosplenomegaly. There is no tenderness. There is no CVA tenderness.  Musculoskeletal: Normal range of motion.  Neurological: She is alert and oriented to person, place, and time. She has normal strength.  Skin: Skin is warm, dry and intact. She is not diaphoretic.  Psychiatric: She has a normal mood and affect. Her speech is normal and behavior is normal.   Labs: CBC:  Recent Labs Lab 12/26/16 1502  WBC 11.1*  NEUTROABS 8.1*  HGB 9.5*  HCT 34.6*  MCV 90.8  PLT 509   Basic Metabolic Panel:  Recent Labs Lab 12/26/16 1502  NA 143  K 6.3*  CL 101  CO2 28  GLUCOSE 100*  BUN 61*  CREATININE 3.03*  CALCIUM 9.3   Cardiac Enzymes:  Recent Labs Lab 12/26/16 1507  TROPIPOC 0.01   BNP (last 3 results)  Recent Labs  09/18/16 1126 11/07/16 1633 12/26/16 1643  BNP 991.0* 490.4* 1,116.0*   CBG: Lab Results  Component Value Date   HGBA1C  10/02/2009    5.6   Recent  Labs Lab 12/26/16  [place]   Imaging: EKG Interpretation  Date/Time:  Sunday December 26 2016 15:27:09 EDT Ventricular Rate:  115 PR Interval:    QRS Duration: 174 QT Interval:  401 QTC Calculation: 555 R Axis:   -177 Text Interpretation:  Atrial fibrillation Nonspecific intraventricular conduction delay Confirmed by Jeneen Rinks  MD, [place] ([id number]) on 12/26/2016 3:42:41 PM  Echocardiogram   Collection Time: 08/30/16  1:17 PM   Study Conclusions - Left ventricle: There was mild concentric hypertrophy. Systolic function was normal. The estimated ejection fraction was in the range of 50% to 55%. - Aortic valve: There was trivial regurgitation. - Right ventricle: The cavity size was severely dilated. - Right atrium: The atrium was severely dilated. - Pulmonary arteries: PA peak pressure: 65 mm Hg (S). - Pericardium, extracardiac: A trivial pericardial effusion was identified posterior to the heart.   Dg Chest Port 1 View Result Date: 12/26/2016 CLINICAL DATA:  Dyspnea, respiratory distress. EXAM: PORTABLE CHEST 1 VIEW COMPARISON:  11/07/2016 FINDINGS: Patient rotated to the right. Lungs are adequately inflated demonstrate prominent perihilar markings suggesting mild vascular congestion. Stable hazy left base/ retrocardiac opacification. Moderate stable cardiomegaly. Calcified plaque over the aortic arch. Remainder of the exam is unchanged. IMPRESSION: Moderate stable cardiomegaly with suggestion of vascular congestion. Could not exclude effusions/atelectasis versus infection in the left base. Electronically Signed   By: Marin Olp M.D.   On: 12/26/2016 15:30   Assessment & Plan by Problem: Principal Problem:   Acute on chronic combined systolic and diastolic CHF (congestive heart failure) (HCC) Active Problems:   Essential hypertension   PAF (paroxysmal atrial fibrillation) (HCC)   Obstructive sleep apnea   Morbid obesity ([place])   Obesity hypoventilation syndrome (HCC)   COPD mixed  type ([place])   Acute kidney injury superimposed on CKD ([place])  [name] is a 61 y.o. female with COPD, CKD, AF, CHF who presents with hypervolemia and CHF exacerbation in the setting of having diuretics discontinued.  1) Combined CHF Exacerbation: Hypervolemic on exam w/ wet lungs and CXR suggestive of edema. Lasix d/c'd inadvertently when being transitioned from IV to PO 4 days ago. AKI likely in the setting of cardiorenal syndrome. Resp status likely complicated by OHS. Currently tachypneic, but stable on [place] O2. May be low output and require ionotropic support. Will request cardiology assistance and may need to follow CO-Ox. - admit to tele - Cardiology c/s, appreciate recs  2) Acute on CKD: #Hyperkalemia: SCr 3, K 6.3. Baseline SCr appears to be ~1.8-2.0, stage III/IV. Reportedly on IV diuresis at her facility when she bump on SCr and was transitioned to orals. Her SCr continued to rise, likely 2/2 cardiorenal syndrome. Could be some component of obstructive nephropathy given her level of fluid overload and abdominal tightness. Hyperkalemic on presentation, was temporized with Lasix and Insulin/Dextrose. - UA - renal US - treat volume overload as above w/ Lasix - Kayaxelate for hyperK - trend BMP at 9pm  3) PAF: In afib on presentation. Also w/ wide QRS. AC w/ coumadin 5mg  qD. INR supertherapeutic at 4.12. Currently with rate control of dilt XR 180mg  qD and carvedilol 6.25 BID. Rhythm control w/ propafenone. - continue rate/rhythm control w/ home meds currently - hold coumadin - trend INR - Cardiology c/s, appreciate recs  4) COPD: Unclear severity, no PFTs on record. On home O2 3L [place]. No sx of acute exacerbation on presentation, but did complain of some some cough last night.  5) Anemia: Hgb 9. Stable.  Normocytic. No signs/symptoms of acute BL. Fe low 11/09/16. Significant CKD, likely epo failure. Continue to monitor and give Fe supplementation. - cont home Fe  6) HTN:  Soft on presentation, but unable to get upper arm pressure due to obesity. Home meds: Coreg, dilt, furosemide, metolazone. Will continue for diuresis and rate control. - Hold Coreg for symptomatic SBP < 90, restart if pressures improve - consider PRN IV Metop for HR >120  7) OHS / OSA: Pt has OSA and OHS but doe snot wear CPAP or BiPap regularly d/t intolerance of the mask. This is likely exacerbating her symptoms and we will offer her BiPap during this admission qHS.  DVT PPx - low molecular weight heparin  Code Status - Full  Consults Placed - Cardilogy (HF)  Dispo: Admit patient to Inpatient with expected length of stay greater than 2 midnights.  Signed: Holley Raring, MD 12/26/2016, 6:44 PM  Pager: [phone]

## 2016-12-26 NOTE — ED Notes (Signed)
Critical Lab Value:  K: 6.3. Physician notified

## 2016-12-27 ENCOUNTER — Encounter (HOSPITAL_COMMUNITY): Payer: Self-pay | Admitting: General Practice

## 2016-12-27 ENCOUNTER — Inpatient Hospital Stay (HOSPITAL_COMMUNITY): Payer: Medicare Other

## 2016-12-27 DIAGNOSIS — Z87891 Personal history of nicotine dependence: Secondary | ICD-10-CM

## 2016-12-27 DIAGNOSIS — Z8781 Personal history of (healed) traumatic fracture: Secondary | ICD-10-CM

## 2016-12-27 DIAGNOSIS — Z809 Family history of malignant neoplasm, unspecified: Secondary | ICD-10-CM

## 2016-12-27 DIAGNOSIS — J9611 Chronic respiratory failure with hypoxia: Secondary | ICD-10-CM

## 2016-12-27 DIAGNOSIS — Z91018 Allergy to other foods: Secondary | ICD-10-CM

## 2016-12-27 DIAGNOSIS — N183 Chronic kidney disease, stage 3 (moderate): Secondary | ICD-10-CM

## 2016-12-27 DIAGNOSIS — I700 Atherosclerosis of aorta: Secondary | ICD-10-CM

## 2016-12-27 DIAGNOSIS — J984 Other disorders of lung: Secondary | ICD-10-CM

## 2016-12-27 DIAGNOSIS — D649 Anemia, unspecified: Secondary | ICD-10-CM

## 2016-12-27 DIAGNOSIS — Z7901 Long term (current) use of anticoagulants: Secondary | ICD-10-CM

## 2016-12-27 DIAGNOSIS — I5043 Acute on chronic combined systolic (congestive) and diastolic (congestive) heart failure: Secondary | ICD-10-CM

## 2016-12-27 DIAGNOSIS — E875 Hyperkalemia: Secondary | ICD-10-CM | POA: Diagnosis present

## 2016-12-27 DIAGNOSIS — Z9981 Dependence on supplemental oxygen: Secondary | ICD-10-CM

## 2016-12-27 DIAGNOSIS — Z960 Presence of urogenital implants: Secondary | ICD-10-CM

## 2016-12-27 DIAGNOSIS — Z880 Allergy status to penicillin: Secondary | ICD-10-CM

## 2016-12-27 DIAGNOSIS — Z8249 Family history of ischemic heart disease and other diseases of the circulatory system: Secondary | ICD-10-CM

## 2016-12-27 DIAGNOSIS — I130 Hypertensive heart and chronic kidney disease with heart failure and stage 1 through stage 4 chronic kidney disease, or unspecified chronic kidney disease: Secondary | ICD-10-CM

## 2016-12-27 LAB — COOXEMETRY PANEL
CARBOXYHEMOGLOBIN: 2.9 % — AB (ref 0.5–1.5)
Methemoglobin: 1.1 % (ref 0.0–1.5)
O2 Saturation: 56.6 %
Total hemoglobin: 9 g/dL — ABNORMAL LOW (ref 12.0–16.0)

## 2016-12-27 LAB — BASIC METABOLIC PANEL
ANION GAP: 13 (ref 5–15)
ANION GAP: 16 — AB (ref 5–15)
ANION GAP: 14 (ref 5–15)
BUN: 62 mg/dL — ABNORMAL HIGH (ref 6–20)
BUN: 62 mg/dL — ABNORMAL HIGH (ref 6–20)
BUN: 64 mg/dL — ABNORMAL HIGH (ref 6–20)
CALCIUM: 9.3 mg/dL (ref 8.9–10.3)
CHLORIDE: 100 mmol/L — AB (ref 101–111)
CHLORIDE: 101 mmol/L (ref 101–111)
CHLORIDE: 100 mmol/L — AB (ref 101–111)
CO2: 29 mmol/L (ref 22–32)
CO2: 27 mmol/L (ref 22–32)
CO2: 31 mmol/L (ref 22–32)
Calcium: 9.4 mg/dL (ref 8.9–10.3)
Calcium: 9.1 mg/dL (ref 8.9–10.3)
Creatinine, Ser: 3.32 mg/dL — ABNORMAL HIGH (ref 0.44–1.00)
Creatinine, Ser: 3.46 mg/dL — ABNORMAL HIGH (ref 0.44–1.00)
Creatinine, Ser: 3.62 mg/dL — ABNORMAL HIGH (ref 0.44–1.00)
GFR calc non Af Amer: 14 mL/min — ABNORMAL LOW (ref >60)
GFR calc non Af Amer: 14 mL/min — ABNORMAL LOW (ref >60)
GFR calc non Af Amer: 13 mL/min — ABNORMAL LOW (ref >60)
GFR, EST AFRICAN AMERICAN: 17 mL/min — AB (ref >60)
GFR, EST AFRICAN AMERICAN: 16 mL/min — AB (ref >60)
GFR, EST AFRICAN AMERICAN: 15 mL/min — AB (ref >60)
Glucose, Bld: 95 mg/dL (ref 65–99)
Glucose, Bld: 90 mg/dL (ref 65–99)
Glucose, Bld: 80 mg/dL (ref 65–99)
POTASSIUM: 5.8 mmol/L — AB (ref 3.5–5.1)
Potassium: 6.5 mmol/L (ref 3.5–5.1)
Potassium: 5.8 mmol/L — ABNORMAL HIGH (ref 3.5–5.1)
Sodium: 142 mmol/L (ref 135–145)
Sodium: 144 mmol/L (ref 135–145)
Sodium: 145 mmol/L (ref 135–145)

## 2016-12-27 LAB — CBC
HCT: 35.1 % — ABNORMAL LOW (ref 36.0–46.0)
HCT: 33 % — ABNORMAL LOW (ref 36.0–46.0)
HEMOGLOBIN: 9.6 g/dL — AB (ref 12.0–15.0)
HEMOGLOBIN: 9.2 g/dL — AB (ref 12.0–15.0)
MCH: 25.5 pg — ABNORMAL LOW (ref 26.0–34.0)
MCH: 25.9 pg — AB (ref 26.0–34.0)
MCHC: 27.4 g/dL — AB (ref 30.0–36.0)
MCHC: 27.9 g/dL — ABNORMAL LOW (ref 30.0–36.0)
MCV: 93.1 fL (ref 78.0–100.0)
MCV: 93 fL (ref 78.0–100.0)
Platelets: 213 10*3/uL (ref 150–400)
Platelets: 189 10*3/uL (ref 150–400)
RBC: 3.77 MIL/uL — ABNORMAL LOW (ref 3.87–5.11)
RBC: 3.55 MIL/uL — AB (ref 3.87–5.11)
RDW: 24.7 % — AB (ref 11.5–15.5)
RDW: 25 % — ABNORMAL HIGH (ref 11.5–15.5)
WBC: 11.4 10*3/uL — AB (ref 4.0–10.5)
WBC: 10.1 10*3/uL (ref 4.0–10.5)

## 2016-12-27 LAB — URINALYSIS, ROUTINE W REFLEX MICROSCOPIC
GLUCOSE, UA: NEGATIVE mg/dL
KETONES UR: NEGATIVE mg/dL
NITRITE: NEGATIVE
PROTEIN: NEGATIVE mg/dL
Specific Gravity, Urine: 1.018 (ref 1.005–1.030)
pH: 5 (ref 5.0–8.0)

## 2016-12-27 LAB — PROTIME-INR
INR: 2.84
Prothrombin Time: 30.4 seconds — ABNORMAL HIGH (ref 11.4–15.2)

## 2016-12-27 LAB — MRSA PCR SCREENING: MRSA BY PCR: NEGATIVE

## 2016-12-27 MED ORDER — AMIODARONE LOAD VIA INFUSION
150.0000 mg | Freq: Once | INTRAVENOUS | Status: AC
  Administered 2016-12-27: 150 mg via INTRAVENOUS
  Filled 2016-12-27: qty 83.34

## 2016-12-27 MED ORDER — METOLAZONE 2.5 MG PO TABS
2.5000 mg | ORAL_TABLET | Freq: Every day | ORAL | Status: DC
  Administered 2016-12-27: 2.5 mg via ORAL
  Filled 2016-12-27: qty 1

## 2016-12-27 MED ORDER — AMIODARONE HCL IN DEXTROSE 360-4.14 MG/200ML-% IV SOLN
60.0000 mg/h | INTRAVENOUS | Status: AC
  Administered 2016-12-27: 60 mg/h via INTRAVENOUS
  Filled 2016-12-27 (×2): qty 200

## 2016-12-27 MED ORDER — MILRINONE LACTATE IN DEXTROSE 20-5 MG/100ML-% IV SOLN
0.2500 ug/kg/min | INTRAVENOUS | Status: DC
  Administered 2016-12-27 – 2017-01-08 (×40): 0.25 ug/kg/min via INTRAVENOUS
  Filled 2016-12-27 (×45): qty 100

## 2016-12-27 MED ORDER — OXYBUTYNIN CHLORIDE 5 MG PO TABS
5.0000 mg | ORAL_TABLET | Freq: Two times a day (BID) | ORAL | Status: DC
  Administered 2016-12-27 – 2017-01-05 (×19): 5 mg via ORAL
  Filled 2016-12-27 (×23): qty 1

## 2016-12-27 MED ORDER — METOLAZONE 5 MG PO TABS
5.0000 mg | ORAL_TABLET | Freq: Every day | ORAL | Status: DC
  Administered 2016-12-28: 5 mg via ORAL
  Filled 2016-12-27: qty 1

## 2016-12-27 MED ORDER — WARFARIN SODIUM 5 MG PO TABS
5.0000 mg | ORAL_TABLET | Freq: Once | ORAL | Status: AC
  Administered 2016-12-27: 5 mg via ORAL
  Filled 2016-12-27: qty 1

## 2016-12-27 MED ORDER — SODIUM POLYSTYRENE SULFONATE 15 GM/60ML PO SUSP
15.0000 g | Freq: Once | ORAL | Status: AC
  Administered 2016-12-27: 15 g via ORAL
  Filled 2016-12-27: qty 60

## 2016-12-27 MED ORDER — OXYBUTYNIN CHLORIDE 5 MG PO TABS: 5.0000 mg | ORAL_TABLET | Freq: Two times a day (BID) | ORAL | Status: DC

## 2016-12-27 MED ORDER — WARFARIN - PHARMACIST DOSING INPATIENT: Freq: Every day | Status: DC

## 2016-12-27 MED ORDER — MIDAZOLAM HCL 2 MG/2ML IJ SOLN
INTRAMUSCULAR | Status: AC
  Administered 2016-12-27: 1 mg
  Filled 2016-12-27: qty 2

## 2016-12-27 MED ORDER — FUROSEMIDE 10 MG/ML IJ SOLN
120.0000 mg | Freq: Three times a day (TID) | INTRAVENOUS | Status: DC
  Filled 2016-12-27: qty 12

## 2016-12-27 MED ORDER — CARVEDILOL 6.25 MG PO TABS
6.2500 mg | ORAL_TABLET | Freq: Two times a day (BID) | ORAL | Status: DC
  Administered 2016-12-27: 6.25 mg via ORAL
  Filled 2016-12-27: qty 1

## 2016-12-27 MED ORDER — MILRINONE LACTATE IN DEXTROSE 20-5 MG/100ML-% IV SOLN: 0.2500 ug/kg/min | INTRAVENOUS | Status: DC

## 2016-12-27 MED ORDER — FUROSEMIDE 10 MG/ML IJ SOLN
160.0000 mg | Freq: Four times a day (QID) | INTRAVENOUS | Status: DC
  Administered 2016-12-27 – 2016-12-28 (×4): 160 mg via INTRAVENOUS
  Filled 2016-12-27 (×6): qty 16

## 2016-12-27 MED ORDER — FENTANYL CITRATE (PF) 100 MCG/2ML IJ SOLN
INTRAMUSCULAR | Status: AC
  Administered 2016-12-27: 25 ug
  Filled 2016-12-27: qty 2

## 2016-12-27 MED ORDER — AMIODARONE HCL IN DEXTROSE 360-4.14 MG/200ML-% IV SOLN
30.0000 mg/h | INTRAVENOUS | Status: DC
  Administered 2016-12-27: 60 mg/h via INTRAVENOUS
  Administered 2016-12-28 – 2017-01-18 (×45): 30 mg/h via INTRAVENOUS
  Filled 2016-12-27 (×45): qty 200

## 2016-12-27 MED ORDER — DILTIAZEM HCL ER COATED BEADS 240 MG PO CP24
240.0000 mg | ORAL_CAPSULE | Freq: Every day | ORAL | Status: DC
  Administered 2016-12-28: 240 mg via ORAL
  Filled 2016-12-27: qty 1

## 2016-12-27 MED ORDER — ORAL CARE MOUTH RINSE
15.0000 mL | Freq: Two times a day (BID) | OROMUCOSAL | Status: DC
  Administered 2016-12-27 – 2017-01-21 (×29): 15 mL via OROMUCOSAL

## 2016-12-27 NOTE — Consult Note (Signed)
[facility] Renal Consultation Note  Requesting MD: [place] Indication for Consultation: A on CRF  HPI:  Paige Marshall is a 61 y.o. female with past medical history significant for hypertension, tobacco abuse, OSA, COPD, nonischemic cardiomyopathy, morbid obesity as well as history of CVA. She also has stage III CK D with creatinine at best 1.6-1.9 but does have intermittent episodes of acute on chronic renal failure. Patient was admitted from 1/28-  2/3 with acute CHF. She was diuresed successfully with a discharge weight of 370 pounds. Initial creatinine was 1.59, increased to 2.17 but was 1.97 at the time of discharge.  She was discharged on low-dose oral Lasix and had immediate regain of fluid weight. Diuretics were increased but kidney function worsened.  She then presented to the emergency department on 3/18 with volume overload creatinine of 3. She has not responded well this time to intravenous Lasix in creatinine now up to 3.4 and that is the reason for consult. The heart failure team has seen her, thinking she has severe right heart failure and are going to try milrinone in addition high-dose diuretics.  She's never been on an ACE/ARB/Aldactone but was on potassium repletion.  Her potassium was over 6 early this a.m. but after Kayexalate was down to 5.8.  She is oligo an uric. She's had a blood pressure as low as 90 and is also had some atrial tachycardias.  Urine is pretty bland in terms of protein or red blood cells.  Renal ultrasound was normal with good sized kidneys.  Currently she says her SOB is better   Creat  Date/Time Value Ref Range Status  11/19/2013 11:20 AM 1.31 (H) 0.50 - 1.10 mg/dL Final   Creatinine, Ser  Date/Time Value Ref Range Status  12/27/2016 08:10 AM 3.46 (H) 0.44 - 1.00 mg/dL Final  12/27/2016 01:58 AM 3.32 (H) 0.44 - 1.00 mg/dL Final  12/26/2016 09:24 PM 3.22 (H) 0.44 - 1.00 mg/dL Final  12/26/2016 03:02 PM 3.03 (H) 0.44 - 1.00 mg/dL Final   11/13/2016 03:45 AM 1.97 (H) 0.44 - 1.00 mg/dL Final  11/12/2016 05:53 AM 1.85 (H) 0.44 - 1.00 mg/dL Final  11/11/2016 04:46 AM 2.17 (H) 0.44 - 1.00 mg/dL Final  11/10/2016 05:05 AM 2.14 (H) 0.44 - 1.00 mg/dL Final  11/09/2016 05:33 AM 1.88 (H) 0.44 - 1.00 mg/dL Final  11/08/2016 07:14 AM 1.60 (H) 0.44 - 1.00 mg/dL Final  11/07/2016 04:33 PM 1.59 (H) 0.44 - 1.00 mg/dL Final  09/23/2016 07:35 AM 1.74 (H) 0.44 - 1.00 mg/dL Final  09/22/2016 03:31 AM 2.47 (H) 0.44 - 1.00 mg/dL Final  09/21/2016 05:02 AM 3.19 (H) 0.44 - 1.00 mg/dL Final  09/20/2016 04:53 AM 4.14 (H) 0.44 - 1.00 mg/dL Final  09/19/2016 04:36 PM 4.42 (H) 0.44 - 1.00 mg/dL Final  09/19/2016 06:34 AM 4.37 (H) 0.44 - 1.00 mg/dL Final  09/18/2016 07:33 PM 4.09 (H) 0.44 - 1.00 mg/dL Final  09/18/2016 11:26 AM 3.71 (H) 0.44 - 1.00 mg/dL Final  09/14/2016 06:04 AM 2.14 (H) 0.44 - 1.00 mg/dL Final  09/13/2016 05:12 AM 2.27 (H) 0.44 - 1.00 mg/dL Final  09/12/2016 05:40 AM 2.68 (H) 0.44 - 1.00 mg/dL Final  09/10/2016 05:20 AM 2.55 (H) 0.44 - 1.00 mg/dL Final  09/09/2016 05:49 AM 2.14 (H) 0.44 - 1.00 mg/dL Final  09/06/2016 05:14 AM 1.62 (H) 0.44 - 1.00 mg/dL Final  09/05/2016 03:55 PM 1.65 (H) 0.44 - 1.00 mg/dL Final  09/01/2016 03:23 AM 2.55 (H) 0.44 - 1.00 mg/dL Final  08/31/2016 03:41 AM 2.40 (H) 0.44 - 1.00 mg/dL Final  08/30/2016 05:07 AM 2.34 (H) 0.44 - 1.00 mg/dL Final  08/29/2016 03:49 PM 2.41 (H) 0.44 - 1.00 mg/dL Final  08/28/2016 09:32 PM 2.26 (H) 0.44 - 1.00 mg/dL Final  04/05/2016 02:35 AM 1.65 (H) 0.44 - 1.00 mg/dL Final  04/04/2016 10:55 AM 1.56 (H) 0.44 - 1.00 mg/dL Final  03/10/2016 04:29 AM 1.79 (H) 0.44 - 1.00 mg/dL Final  03/09/2016 04:23 AM 2.47 (H) 0.44 - 1.00 mg/dL Final    Comment:    DELTA CHECK NOTED REPEATED TO VERIFY   03/08/2016 06:21 AM 3.60 (H) 0.44 - 1.00 mg/dL Final  03/08/2016 12:29 AM 3.79 (H) 0.44 - 1.00 mg/dL Final  10/11/2015 05:59 AM 1.64 (H) 0.44 - 1.00 mg/dL Final  10/10/2015 07:44 PM  1.88 (H) 0.44 - 1.00 mg/dL Final  01/21/2015 05:05 AM 1.27 (H) 0.50 - 1.10 mg/dL Final  01/20/2015 03:40 AM 1.23 (H) 0.50 - 1.10 mg/dL Final  01/19/2015 06:08 AM 1.50 (H) 0.50 - 1.10 mg/dL Final  01/18/2015 11:43 AM 1.47 (H) 0.50 - 1.10 mg/dL Final  01/17/2015 03:05 AM 1.55 (H) 0.50 - 1.10 mg/dL Final  01/16/2015 05:45 AM 1.46 (H) 0.50 - 1.10 mg/dL Final  01/15/2015 03:46 AM 1.66 (H) 0.50 - 1.10 mg/dL Final  01/14/2015 10:05 AM 1.40 (H) 0.50 - 1.10 mg/dL Final  01/14/2015 12:00 AM 1.36 (H) 0.50 - 1.10 mg/dL Final  10/02/2014 09:23 PM 2.80 (H) 0.50 - 1.10 mg/dL Final  10/14/2010 10:10 PM 1.00 0.4 - 1.2 mg/dL Final  11/18/2009 04:30 AM 0.95 0.4 - 1.2 mg/dL Final  11/17/2009 08:28 PM 1.24 (H) 0.4 - 1.2 mg/dL Final     PMHx:   Past Medical History:  Diagnosis Date  . Arthritis    "both knees" (01/14/2015)  . Asthma   . Chronic combined systolic and diastolic CHF (congestive heart failure) ([place])   . CKD (chronic kidney disease), stage III   . COPD (chronic obstructive pulmonary disease) ([place])   . Degenerative joint disease of both lower legs   . Dyspnea   . Edema    Lower extremity  . Former smoker   . Gout    hx in "both knees" (01/14/2015)  . H. pylori infection 01/03/2014   +breath test.    . High cholesterol   . Hypertension   . Morbid obesity ([place])   . NICM (nonischemic cardiomyopathy) ([place])    a. LHC 01/2015: normal cors, EF 45-50%. b. EF 50-55% in 08/2016.  [name] Nonischemic cardiomyopathy ([place])   . Normal coronary arteries   . OSA (obstructive sleep apnea)    "never sent CPAP out" (01/14/2015)  . Paroxysmal atrial fibrillation ([facility])   . Pneumonia X 2  . Pulmonary hypertension   . Respiratory failure ([place])   . Rheumatoid arthritis ([place])   . Stroke [facility])     Past Surgical History:  Procedure Laterality Date  . ANGIOGRAM/LV (CONGENITAL)  2007  . BREATH TEK H PYLORI N/A 12/31/2013   Procedure: [place];  Surgeon: Gayland Curry, MD;  Location: [facility];   Service: General;  Laterality: N/A;  . CORNEAL TRANSPLANT Right ~ 2010  . CORONARY ANGIOPLASTY WITH STENT PLACEMENT  11/04/2005   "1"  . DOPPLER ECHOCARDIOGRAPHY     2 D   EF of 45%  . EYE SURGERY    . LEFT HEART CATHETERIZATION WITH CORONARY ANGIOGRAM N/A 01/20/2015   Procedure: LEFT HEART CATHETERIZATION WITH CORONARY ANGIOGRAM;  Surgeon: Roderic Palau  Adora Fridge, MD;  Location: [facility];  Service: Cardiovascular;  Laterality: N/A;  . sleep study  2011  . stress myocardial dipyridamole perfusion    . TUBAL LIGATION  1982  . venous duplex ultrasound  2013    Family Hx:  Family History  Problem Relation Age of Onset  . Heart disease Mother   . Cancer Father     colon  . Hypertension Other     Social History:  reports that she quit smoking about 8 years ago. Her smoking use included Cigarettes. She has a 11.55 pack-year smoking history. She has never used smokeless tobacco. She reports that she does not drink alcohol or use drugs.  Allergies:  Allergies  Allergen Reactions  . Penicillins Hives, Itching, Swelling and Other (See Comments)    Tongue swelling Has patient had a PCN reaction causing immediate rash, facial/tongue/throat swelling, SOB or lightheadedness with hypotension: Yes  Clarified with the patient her penicillin allergy. Pt has tolerated cefepime in the past. Pt also states that she can take amoxicillin.    . Citrus Hives    Medications: Prior to Admission medications   Medication Sig Start Date End Date Taking? Authorizing Provider  ARTIFICIAL TEAR OP Apply 1 drop to eye every 6 (six) hours as needed (dry eyes).   Yes Historical Provider, MD  carvedilol (COREG) 6.25 MG tablet Take 1 tablet (6.25 mg total) by mouth 2 (two) times daily with a meal. 09/23/16  Yes Shanker Kristeen Mans, MD  diclofenac sodium (VOLTAREN) 1 % GEL Apply 4 g topically every 6 (six) hours as needed (pain).    Yes Historical Provider, MD  diltiazem (DILACOR XR) 180 MG 24 hr capsule Take 180 mg by  mouth daily.   Yes Historical Provider, MD  docusate sodium (COLACE) 100 MG capsule Take 1 capsule (100 mg total) by mouth 2 (two) times daily. 09/14/16  Yes Eugenie Filler, MD  ferrous sulfate 325 (65 FE) MG tablet Take 1 tablet (325 mg total) by mouth 3 (three) times daily with meals. 09/23/16  Yes Shanker Kristeen Mans, MD  furosemide (LASIX) 40 MG tablet Take 1 tablet (40 mg total) by mouth 2 (two) times daily. Patient taking differently: Take 80 mg by mouth 2 (two) times daily.  11/13/16  Yes Theodis Blaze, MD  metolazone (ZAROXOLYN) 2.5 MG tablet Take 1 tablet (2.5 mg total) by mouth daily. 12/15/16 03/15/17 Yes Lorretta Harp, MD  Multiple Vitamin (MULTIVITAMIN WITH MINERALS) TABS tablet Take 1 tablet by mouth daily.   Yes Historical Provider, MD  omega-3 acid ethyl esters (LOVAZA) 1 g capsule Take 1 g by mouth 2 (two) times daily.   Yes Historical Provider, MD  oxyCODONE (OXY IR/ROXICODONE) 5 MG immediate release tablet Take 1 tablet (5 mg total) by mouth every 6 (six) hours as needed for breakthrough pain. 11/13/16  Yes Theodis Blaze, MD  polyethylene glycol [facility] / Floria Raveling) packet Take 17 g by mouth daily as needed. Patient taking differently: Take 17 g by mouth daily as needed (constipation). Mix in 8 oz liquid and drink 09/23/16  Yes Shanker Kristeen Mans, MD  potassium chloride SA (K-DUR,KLOR-CON) 20 MEQ tablet Take 40 mEq by mouth 3 (three) times daily.    Yes Historical Provider, MD  predniSONE (DELTASONE) 10 MG tablet Take 40 mg tablet 11/14/2016 and taper down by 10 mg daily until completed 11/13/16  Yes Theodis Blaze, MD  PROAIR HFA 108 [phone] Base) MCG/ACT inhaler INHALE 2 PUFFS EVERY  6 HOURS AS NEEDED FOR WHEEZING OR SHORTNESS OF BREATH. 11/12/16  Yes Deneise Lever, MD  promethazine (PHENERGAN) 25 MG tablet Take 25 mg by mouth every 6 (six) hours as needed for nausea or vomiting.   Yes Historical Provider, MD  propafenone (RYTHMOL) 150 MG tablet Take 1 tablet (150 mg total) by mouth every 8  (eight) hours. 11/13/16  Yes Theodis Blaze, MD  senna-docusate (SENNA S) 8.6-50 MG tablet Take 2 tablets by mouth See admin instructions. Take 2 tablet by mouth daily at bedtime, may also take 2 tablets as needed for constipation   Yes Historical Provider, MD  warfarin (COUMADIN) 5 MG tablet Take 5 mg by mouth daily.   Yes Historical Provider, MD  zolpidem (AMBIEN) 5 MG tablet Take 5 mg by mouth at bedtime as needed for sleep.   Yes Historical Provider, MD  bisacodyl (DULCOLAX) 5 MG EC tablet Take 1 tablet (5 mg total) by mouth daily as needed for moderate constipation. Patient not taking: Reported on 12/26/2016 11/13/16   Theodis Blaze, MD  PRESCRIPTION MEDICATION Inhale into the lungs at bedtime. BIPAP    Historical Provider, MD    I have reviewed the patient's current medications.  Labs:  Results for orders placed or performed during the hospital encounter of 12/26/16 (from the past 48 hour(s))  CBC with Differential/Platelet     Status: Abnormal   Collection Time: 12/26/16  3:02 PM  Result Value Ref Range   WBC 11.1 (H) 4.0 - 10.5 K/uL   RBC 3.81 (L) 3.87 - 5.11 MIL/uL   Hemoglobin 9.5 (L) 12.0 - 15.0 g/dL   HCT 34.6 (L) 36.0 - 46.0 %   MCV 90.8 78.0 - 100.0 fL   MCH 24.9 (L) 26.0 - 34.0 pg   MCHC 27.5 (L) 30.0 - 36.0 g/dL   RDW 24.5 (H) 11.5 - 15.5 %   Platelets 304 150 - 400 K/uL    Comment: PLATELET COUNT CONFIRMED BY SMEAR   Neutrophils Relative % 73 %   Lymphocytes Relative 17 %   Monocytes Relative 9 %   Eosinophils Relative 1 %   Basophils Relative 0 %   Neutro Abs 8.1 (H) 1.7 - 7.7 K/uL   Lymphs Abs 1.9 0.7 - 4.0 K/uL   Monocytes Absolute 1.0 0.1 - 1.0 K/uL   Eosinophils Absolute 0.1 0.0 - 0.7 K/uL   Basophils Absolute 0.0 0.0 - 0.1 K/uL   RBC Morphology ELLIPTOCYTES     Comment: RARE NRBCs  Basic metabolic panel     Status: Abnormal   Collection Time: 12/26/16  3:02 PM  Result Value Ref Range   Sodium 143 135 - 145 mmol/L   Potassium 6.3 (HH) 3.5 - 5.1 mmol/L     Comment: CRITICAL RESULT CALLED TO, READ BACK BY AND VERIFIED WITH: NOCOLE WAGNER RN AT 1601 12/26/16 BY WOOLLENK    Chloride 101 101 - 111 mmol/L   CO2 28 22 - 32 mmol/L   Glucose, Bld 100 (H) 65 - 99 mg/dL   BUN 61 (H) 6 - 20 mg/dL   Creatinine, Ser 3.03 (H) 0.44 - 1.00 mg/dL   Calcium 9.3 8.9 - 10.3 mg/dL   GFR calc non Af Amer 16 (L) >60 mL/min   GFR calc Af Amer 18 (L) >60 mL/min    Comment: (NOTE) The eGFR has been calculated using the CKD EPI equation. This calculation has not been validated in all clinical situations. eGFR's persistently <60 mL/min signify possible Chronic Kidney  Disease.    Anion gap 14 5 - 15  I-stat troponin, ED     Status: None   Collection Time: 12/26/16  3:07 PM  Result Value Ref Range   Troponin i, poc 0.01 0.00 - 0.08 ng/mL   Comment 3            Comment: Due to the release kinetics of cTnI, a negative result within the first hours of the onset of symptoms does not rule out myocardial infarction with certainty. If myocardial infarction is still suspected, repeat the test at appropriate intervals.   Brain natriuretic peptide     Status: Abnormal   Collection Time: 12/26/16  4:43 PM  Result Value Ref Range   B Natriuretic Peptide 1,116.0 (H) 0.0 - 100.0 pg/mL  CBG monitoring, ED     Status: None   Collection Time: 12/26/16  4:45 PM  Result Value Ref Range   Glucose-Capillary 88 65 - 99 mg/dL  Basic metabolic panel     Status: Abnormal   Collection Time: 12/26/16  9:24 PM  Result Value Ref Range   Sodium 142 135 - 145 mmol/L   Potassium 6.2 (H) 3.5 - 5.1 mmol/L   Chloride 100 (L) 101 - 111 mmol/L   CO2 31 22 - 32 mmol/L   Glucose, Bld 108 (H) 65 - 99 mg/dL   BUN 62 (H) 6 - 20 mg/dL   Creatinine, Ser 3.22 (H) 0.44 - 1.00 mg/dL   Calcium 9.1 8.9 - 10.3 mg/dL   GFR calc non Af Amer 15 (L) >60 mL/min   GFR calc Af Amer 17 (L) >60 mL/min    Comment: (NOTE) The eGFR has been calculated using the CKD EPI equation. This calculation has not  been validated in all clinical situations. eGFR's persistently <60 mL/min signify possible Chronic Kidney Disease.    Anion gap 11 5 - 15  CBC     Status: Abnormal   Collection Time: 12/27/16  1:58 AM  Result Value Ref Range   WBC 11.4 (H) 4.0 - 10.5 K/uL   RBC 3.77 (L) 3.87 - 5.11 MIL/uL   Hemoglobin 9.6 (L) 12.0 - 15.0 g/dL   HCT 35.1 (L) 36.0 - 46.0 %   MCV 93.1 78.0 - 100.0 fL   MCH 25.5 (L) 26.0 - 34.0 pg   MCHC 27.4 (L) 30.0 - 36.0 g/dL   RDW 24.7 (H) 11.5 - 15.5 %   Platelets 213 150 - 400 K/uL  Basic metabolic panel     Status: Abnormal   Collection Time: 12/27/16  1:58 AM  Result Value Ref Range   Sodium 142 135 - 145 mmol/L   Potassium 6.5 (HH) 3.5 - 5.1 mmol/L    Comment: CRITICAL RESULT CALLED TO, READ BACK BY AND VERIFIED WITH: M.FUTRELL,RN 0234 12/27/16 M.CAMPBELL    Chloride 100 (L) 101 - 111 mmol/L   CO2 29 22 - 32 mmol/L   Glucose, Bld 95 65 - 99 mg/dL   BUN 62 (H) 6 - 20 mg/dL   Creatinine, Ser 3.32 (H) 0.44 - 1.00 mg/dL   Calcium 9.4 8.9 - 10.3 mg/dL   GFR calc non Af Amer 14 (L) >60 mL/min   GFR calc Af Amer 17 (L) >60 mL/min    Comment: (NOTE) The eGFR has been calculated using the CKD EPI equation. This calculation has not been validated in all clinical situations. eGFR's persistently <60 mL/min signify possible Chronic Kidney Disease.    Anion gap 13 5 - 15  Urinalysis, Routine w reflex microscopic     Status: Abnormal   Collection Time: 12/27/16  3:39 AM  Result Value Ref Range   Color, Urine AMBER (A) YELLOW    Comment: BIOCHEMICALS MAY BE AFFECTED BY COLOR   APPearance CLOUDY (A) CLEAR   Specific Gravity, Urine 1.018 1.005 - 1.030   pH 5.0 5.0 - 8.0   Glucose, UA NEGATIVE NEGATIVE mg/dL   Hgb urine dipstick SMALL (A) NEGATIVE   Bilirubin Urine SMALL (A) NEGATIVE   Ketones, ur NEGATIVE NEGATIVE mg/dL   Protein, ur NEGATIVE NEGATIVE mg/dL   Nitrite NEGATIVE NEGATIVE   Leukocytes, UA SMALL (A) NEGATIVE   RBC / HPF 0-5 0 - 5 RBC/hpf   WBC,  UA 6-30 0 - 5 WBC/hpf   Bacteria, UA RARE (A) NONE SEEN   Squamous Epithelial / LPF 6-30 (A) NONE SEEN   Mucous PRESENT    Hyaline Casts, UA PRESENT   Basic metabolic panel     Status: Abnormal   Collection Time: 12/27/16  8:10 AM  Result Value Ref Range   Sodium 144 135 - 145 mmol/L   Potassium 5.8 (H) 3.5 - 5.1 mmol/L   Chloride 101 101 - 111 mmol/L   CO2 27 22 - 32 mmol/L   Glucose, Bld 90 65 - 99 mg/dL   BUN 62 (H) 6 - 20 mg/dL   Creatinine, Ser 3.46 (H) 0.44 - 1.00 mg/dL   Calcium 9.1 8.9 - 10.3 mg/dL   GFR calc non Af Amer 14 (L) >60 mL/min   GFR calc Af Amer 16 (L) >60 mL/min    Comment: (NOTE) The eGFR has been calculated using the CKD EPI equation. This calculation has not been validated in all clinical situations. eGFR's persistently <60 mL/min signify possible Chronic Kidney Disease.    Anion gap 16 (H) 5 - 15  CBC     Status: Abnormal   Collection Time: 12/27/16  8:10 AM  Result Value Ref Range   WBC 10.1 4.0 - 10.5 K/uL   RBC 3.55 (L) 3.87 - 5.11 MIL/uL   Hemoglobin 9.2 (L) 12.0 - 15.0 g/dL   HCT 33.0 (L) 36.0 - 46.0 %   MCV 93.0 78.0 - 100.0 fL   MCH 25.9 (L) 26.0 - 34.0 pg   MCHC 27.9 (L) 30.0 - 36.0 g/dL   RDW 25.0 (H) 11.5 - 15.5 %   Platelets 189 150 - 400 K/uL    Comment: PLATELET COUNT CONFIRMED BY SMEAR  Protime-INR     Status: Abnormal   Collection Time: 12/27/16  8:10 AM  Result Value Ref Range   Prothrombin Time 30.4 (H) 11.4 - 15.2 seconds   INR 2.84      ROS:  A comprehensive review of systems was negative except for: Constitutional: positive for fatigue Respiratory: positive for dyspnea on exertion Cardiovascular: positive for lower extremity edema  Physical Exam: Vitals:   12/27/16 0500 12/27/16 0815  BP: (!) 148/89 107/80  Pulse: (!) 136 (!) 127  Resp: 20 20  Temp: 98.1 F (36.7 C) 97.5 F (36.4 C)     General: Obese, somnolent but arousable, minimal distress HEENT: Pupils are equally round reactive to light, extraocular  motions are intact, mucous membranes are moist Neck: Positive for JVD Heart: Tachycardic Lungs: Coarse breath sounds bilaterally Abdomen: Obese, soft, nontender Extremities: 2-3+ pitting edema Skin: Warm and dry Neuro: Somnolent but arousable nonfocal  Assessment/Plan: 61 year old black female with morbid obesity and a multitude of pulmonary issues who presents  with recurrent congestive heart failure and acute on chronic renal failure 1.Renal- acute on chronic renal failure- most likely due to cardiorenal syndrome/hemodynamics as urinalysis is fairly bland and renal ultrasound is normal. Appreciate heart failure team initiating milrinone in an intensive care unit setting and we will does need to hope that her hemodynamics improve enough to turn around her kidney function. She will also be continued on high-dose diuretics although does not seem to be responding to them at this time. There are no absolute indications for dialysis at this time, however potassium could become problematic. I have told the patient that if we don't see evidence of this turning around it's likely that she will require supportive dialysis in the next 24-48 hours 2. Hypertension/volume  - volume overloaded but oligo anuric- moving to ICU and initiating milrinone to hopefully induce some urine output along with high-dose diuretics 3. Anemia  - due to frequent blood draws hospitalization as well as CK D. I will check iron stores and replete as needed- consider ESA 4. Hyperkalemia- checking potassium frequently and use when necessary Kayexalate  Thank you for this consult. We will continue to follow with you   Deyja Sochacki A 12/27/2016, 2:26 PM

## 2016-12-27 NOTE — Care Management Note (Signed)
Case Management Note  Patient Details  Name: MATEJA DIER MRN: [id number] Date of Birth: 1955/12/17  Subjective/Objective:        Admitted with CHF           Action/Plan: Patient from SNF; to return to SNF when medically stable; Soc Worker following case for disposition needs when patent is medically stable. Noted 5 admissions in 6 months.  Expected Discharge Date:      Undetermined at this time            Expected Discharge Plan:  [place]  Discharge planning Services  CM Consult  Status of Service:  In process, will continue to follow  Sherrilyn Rist [phone] 12/27/2016, 2:18 PM

## 2016-12-27 NOTE — Consult Note (Signed)
Advanced Heart Failure Team Consult Note  Referring Physician: Dr. Stanford Breed Primary Physician:EDWARDS, Milford Cage, NP  Primary Cardiologist: Dr. Gwenlyn Found   Reason for Consultation: Acute on chronic diastolic CHF  HPI:     Paige Marshall is a 61 y.o. female with a past medical history of HTN, tobacco abuse, PAF on Coumadin, OSA, COPD, CKD stage III (baseline creatinine 1.5-1.7) , NICM (normal cors in 01/2015), CVA and morbid obesity. Last Echo in 09/2016 with EF 50-55%, has been as low as 40-45% in 01/2015. She was admitted on 12/26/16 with increased lower extremity edema and volume overload. Currently residing at [facility], wheelchair bound.   She was recently admitted from 11/07/16 to 11/13/16 with acute on chronic diastolic CHF. She was diuresed with 60mg  IV Lasix BID and metolazone. Discharge weight was 370 pounds. She was discharged back to SNF with Lasix 40mg  BID. Seen by Dr. Gwenlyn Found on 12/15/16, her weight was increased to 403 pounds and he notes that she had a PICC line placed at her SNF and was getting IV Lasix through her PICC. Dr. Gwenlyn Found increased her IV Lasix to 80mg  BID and he added metolazone 2.5mg  daily.   She was seen the next week on 12/22/16, her weight was down to 392 pounds and her IV lasix + metolazone had been discontinued on 12/20/16 at SNF as her creatinine was up to 2.4. She was started on po Lasix 80mg  BID and daily 2.5mg  metolazone at that appointment. PICC line was removed.   She presented to the ED on 12/26/16 with volume overload, weight up to 410 pounds, had increasing supplemental O2 requirement. Pertinent admission labs include - K 6.3, Creatinine 3.03, BNP 1116, EKG showed Afib with rate of 115. She has gotten one dose of 80mg  IV lasix and 2.5mg  of metolazone with minimal urine output, her Lasix was increased to 160mg  q 6 hours today.   She tells me that she is mostly inactive at SNF, she does get PT but is limited by SOB. She get SOB with minimal  activity including brushing her teeth. She eats what is cooked for her at the facility but it sounds like she does not get a low sodium diet. Drinks about 3 cups of water a day. Denies snacking on high salt foods. She does use her CPAP nightly, endorses orthopnea, early satiety.   Review of Systems: [y] = yes, [ ]  = no   General: Weight gain Blue.Reese ]; Weight loss [ ] ; Anorexia [ ] ; Fatigue Blue.Reese ]; Fever [ ] ; Chills [ ] ; Weakness Blue.Reese ]  Cardiac: Chest pain/pressure [ ] ; Resting SOB Blue.Reese ]; Exertional SOB [ y]; Orthopnea Blue.Reese ]; Pedal Edema Blue.Reese ]; Palpitations [ ] ; Syncope [ ] ; Presyncope [ ] ; Paroxysmal nocturnal dyspnea[ ]   Pulmonary: Cough [ ] ; Wheezing[ ] ; Hemoptysis[ ] ; Sputum [ ] ; Snoring Blue.Reese ]  GI: Vomiting[ ] ; Dysphagia[ ] ; Melena[ ] ; Hematochezia [ ] ; Heartburn[ ] ; Abdominal pain [ ] ; Constipation [ ] ; Diarrhea [ ] ; BRBPR [ ]   GU: Hematuria[ ] ; Dysuria [ ] ; Nocturia[ ]   Vascular: Pain in legs with walking [ ] ; Pain in feet with lying flat [ ] ; Non-healing sores [ ] ; Stroke [ ] ; TIA [ ] ; Slurred speech [ ] ;  Neuro: Headaches[ ] ; Vertigo[ ] ; Seizures[ ] ; Paresthesias[ ] ;Blurred vision [ ] ; Diplopia [ ] ; Vision changes [ ]   Ortho/Skin: Arthritis Blue.Reese ]; Joint pain Blue.Reese ]; Muscle pain [ ] ; Joint swelling [ ] ; Back Pain [ ] ; Rash [ ]   Psych: Depression[ ] ; Anxiety[ ]   Heme: Bleeding problems [ ] ; Clotting disorders [ ] ; Anemia Blue.Reese ]  Endocrine: Diabetes [ ] ; Thyroid dysfunction[ ]   Home Medications Prior to Admission medications   Medication Sig Start Date End Date Taking? Authorizing Provider  ARTIFICIAL TEAR OP Apply 1 drop to eye every 6 (six) hours as needed (dry eyes).   Yes Historical Provider, MD  carvedilol (COREG) 6.25 MG tablet Take 1 tablet (6.25 mg total) by mouth 2 (two) times daily with a meal. 09/23/16  Yes Shanker Kristeen Mans, MD  diclofenac sodium (VOLTAREN) 1 % GEL Apply 4 g topically every 6 (six) hours as needed (pain).    Yes Historical Provider, MD  diltiazem (DILACOR XR) 180 MG 24 hr  capsule Take 180 mg by mouth daily.   Yes Historical Provider, MD  docusate sodium (COLACE) 100 MG capsule Take 1 capsule (100 mg total) by mouth 2 (two) times daily. 09/14/16  Yes Eugenie Filler, MD  ferrous sulfate 325 (65 FE) MG tablet Take 1 tablet (325 mg total) by mouth 3 (three) times daily with meals. 09/23/16  Yes Shanker Kristeen Mans, MD  furosemide (LASIX) 40 MG tablet Take 1 tablet (40 mg total) by mouth 2 (two) times daily. Patient taking differently: Take 80 mg by mouth 2 (two) times daily.  11/13/16  Yes Theodis Blaze, MD  metolazone (ZAROXOLYN) 2.5 MG tablet Take 1 tablet (2.5 mg total) by mouth daily. 12/15/16 03/15/17 Yes Lorretta Harp, MD  Multiple Vitamin (MULTIVITAMIN WITH MINERALS) TABS tablet Take 1 tablet by mouth daily.   Yes Historical Provider, MD  omega-3 acid ethyl esters (LOVAZA) 1 g capsule Take 1 g by mouth 2 (two) times daily.   Yes Historical Provider, MD  oxyCODONE (OXY IR/ROXICODONE) 5 MG immediate release tablet Take 1 tablet (5 mg total) by mouth every 6 (six) hours as needed for breakthrough pain. 11/13/16  Yes Theodis Blaze, MD  polyethylene glycol [facility] Floria Raveling) packet Take 17 g by mouth daily as needed. Patient taking differently: Take 17 g by mouth daily as needed (constipation). Mix in 8 oz liquid and drink 09/23/16  Yes Shanker Kristeen Mans, MD  potassium chloride SA (K-DUR,KLOR-CON) 20 MEQ tablet Take 40 mEq by mouth 3 (three) times daily.    Yes Historical Provider, MD  predniSONE (DELTASONE) 10 MG tablet Take 40 mg tablet 11/14/2016 and taper down by 10 mg daily until completed 11/13/16  Yes Theodis Blaze, MD  PROAIR HFA 108 [phone] Base) MCG/ACT inhaler INHALE 2 PUFFS EVERY 6 HOURS AS NEEDED FOR WHEEZING OR SHORTNESS OF BREATH. 11/12/16  Yes Deneise Lever, MD  promethazine (PHENERGAN) 25 MG tablet Take 25 mg by mouth every 6 (six) hours as needed for nausea or vomiting.   Yes Historical Provider, MD  propafenone (RYTHMOL) 150 MG tablet Take 1 tablet (150 mg total)  by mouth every 8 (eight) hours. 11/13/16  Yes Theodis Blaze, MD  senna-docusate (SENNA S) 8.6-50 MG tablet Take 2 tablets by mouth See admin instructions. Take 2 tablet by mouth daily at bedtime, may also take 2 tablets as needed for constipation   Yes Historical Provider, MD  warfarin (COUMADIN) 5 MG tablet Take 5 mg by mouth daily.   Yes Historical Provider, MD  zolpidem (AMBIEN) 5 MG tablet Take 5 mg by mouth at bedtime as needed for sleep.   Yes Historical Provider, MD  bisacodyl (DULCOLAX) 5 MG EC tablet Take 1 tablet (5 mg total)  by mouth daily as needed for moderate constipation. Patient not taking: Reported on 12/26/2016 11/13/16   Theodis Blaze, MD  PRESCRIPTION MEDICATION Inhale into the lungs at bedtime. BIPAP    Historical Provider, MD    Past Medical History: Past Medical History:  Diagnosis Date  . Arthritis    "both knees" (01/14/2015)  . Asthma   . Chronic combined systolic and diastolic CHF (congestive heart failure) ([place])   . CKD (chronic kidney disease), stage III   . COPD (chronic obstructive pulmonary disease) ([place])   . Degenerative joint disease of both lower legs   . Dyspnea   . Edema    Lower extremity  . Former smoker   . Gout    hx in "both knees" (01/14/2015)  . H. pylori infection 01/03/2014   +breath test.    . High cholesterol   . Hypertension   . Morbid obesity ([place])   . NICM (nonischemic cardiomyopathy) ([place])    a. LHC 01/2015: normal cors, EF 45-50%. b. EF 50-55% in 08/2016.  [name] Nonischemic cardiomyopathy ([place])   . Normal coronary arteries   . OSA (obstructive sleep apnea)    "never sent CPAP out" (01/14/2015)  . Paroxysmal atrial fibrillation ([facility])   . Pneumonia X 2  . Pulmonary hypertension   . Respiratory failure ([place])   . Rheumatoid arthritis ([place])   . Stroke [facility])     Past Surgical History: Past Surgical History:  Procedure Laterality Date  . ANGIOGRAM/LV (CONGENITAL)  2007  . BREATH TEK H PYLORI N/A 12/31/2013   Procedure: [place];   Surgeon: Gayland Curry, MD;  Location: [facility];  Service: General;  Laterality: N/A;  . CORNEAL TRANSPLANT Right ~ 2010  . CORONARY ANGIOPLASTY WITH STENT PLACEMENT  11/04/2005   "1"  . DOPPLER ECHOCARDIOGRAPHY     2 D   EF of 45%  . EYE SURGERY    . LEFT HEART CATHETERIZATION WITH CORONARY ANGIOGRAM N/A 01/20/2015   Procedure: LEFT HEART CATHETERIZATION WITH CORONARY ANGIOGRAM;  Surgeon: Lorretta Harp, MD;  Location: [facility] CATH LAB;  Service: Cardiovascular;  Laterality: N/A;  . sleep study  2011  . stress myocardial dipyridamole perfusion    . TUBAL LIGATION  1982  . venous duplex ultrasound  2013    Family History: Family History  Problem Relation Age of Onset  . Heart disease Mother   . Cancer Father     colon  . Hypertension Other     Social History: Social History   Social History  . Marital status: Widowed    Spouse name: N/A  . Number of children: N/A  . Years of education: N/A   Social History Main Topics  . Smoking status: Former Smoker    Packs/day: 0.33    Years: 35.00    Types: Cigarettes    Quit date: 10/11/2008  . Smokeless tobacco: Never Used  . Alcohol use No     Comment: 01/14/2015 "~ 4, 24oz cans beer/wk"  . Drug use: No  . Sexual activity: No   Other Topics Concern  . None   Social History Narrative  . None    Allergies:  Allergies  Allergen Reactions  . Penicillins Hives, Itching, Swelling and Other (See Comments)    Tongue swelling Has patient had a PCN reaction causing immediate rash, facial/tongue/throat swelling, SOB or lightheadedness with hypotension: Yes  Clarified with the patient her penicillin allergy. Pt has tolerated cefepime in the past. Pt also states  that she can take amoxicillin.    . Citrus Hives    Objective:    Vital Signs:   Temp:  [97.5 F (36.4 C)-98.8 F (37.1 C)] 97.5 F (36.4 C) (03/19 0815) Pulse Rate:  [33-136] 127 (03/19 0815) Resp:  [19-30] 20 (03/19 0815) BP: (85-148)/(55-100) 107/80 (03/19  0815) SpO2:  [95 %-100 %] 98 % (03/19 0815) Weight:  [407 lb 8 oz (184.8 kg)-411 lb (186.4 kg)] 411 lb (186.4 kg) (03/19 0457) Last BM Date: 12/26/16  Weight change: Filed Weights   12/26/16 1848 12/27/16 0457  Weight: (!) 407 lb 8 oz (184.8 kg) (!) 411 lb (186.4 kg)    Intake/Output:   Intake/Output Summary (Last 24 hours) at 12/27/16 1124 Last data filed at 12/27/16 0853  Gross per 24 hour  Intake              320 ml  Output               50 ml  Net              270 ml     Physical Exam: General:  Morbidly obese female, SOB at rest.  HEENT: normal Neck: supple. JVP hard to see. Appears elevated. Carotids 2+ bilat; no bruits. No lymphadenopathy or thyromegaly appreciated. Cor: PMI nonpalpable Irregularly irregular rhythm. Distant HS  No obvious murmur Lungs: Diminished in bilateral upper lobes.  Abdomen: Markedly obese, soft, nontender, + distended. Unable to assess for hepatosplenomegaly. No bruits or masses. Good bowel sounds. Extremities: no cyanosis, clubbing, rash, 3+ pedal edema up to thighs. Extremities warm.  Neuro: alert & orientedx3, cranial nerves grossly intact. moves all 4 extremities w/o difficulty. Affect pleasant  Telemetry: Afib, rates elevated in the 120's. Personally reviewed  Labs: Basic Metabolic Panel:  Recent Labs Lab 12/26/16 1502 12/26/16 2124 12/27/16 0158 12/27/16 0810  NA 143 142 142 144  K 6.3* 6.2* 6.5* 5.8*  CL 101 100* 100* 101  CO2 28 31 29 27   GLUCOSE 100* 108* 95 90  BUN 61* 62* 62* 62*  CREATININE 3.03* 3.22* 3.32* 3.46*  CALCIUM 9.3 9.1 9.4 9.1     CBC:  Recent Labs Lab 12/26/16 1502 12/27/16 0158 12/27/16 0810  WBC 11.1* 11.4* 10.1  NEUTROABS 8.1*  --   --   HGB 9.5* 9.6* 9.2*  HCT 34.6* 35.1* 33.0*  MCV 90.8 93.1 93.0  PLT 304 213 189     BNP: BNP (last 3 results)  Recent Labs  09/18/16 1126 11/07/16 1633 12/26/16 1643  BNP 991.0* 490.4* 1,116.0*      CBG:  Recent Labs Lab 12/26/16 1645    GLUCAP 88    Coagulation Studies:  Recent Labs  12/27/16 0810  LABPROT 30.4*  INR 2.84    Other results: EKG: atrial fibrillation, rate 115. QRS is 0.17 RAD  Imaging: US Renal  Result Date: 12/26/2016 CLINICAL DATA:  Acute on chronic renal failure EXAM: RENAL / URINARY TRACT ULTRASOUND COMPLETE COMPARISON:  None. FINDINGS: Right Kidney: Length: 12.1 cm. Echogenicity within normal limits. No mass or hydronephrosis visualized. Left Kidney: Length: 11.1 cm. Echogenicity within normal limits. No mass or hydronephrosis visualized. Bladder: The bladder is nondistended. The patient voided immediately before the examination. Other: Moderate volume of lower abdominal ascites. IMPRESSION: 1. Normal appearance of the kidneys. 2. Moderate lower abdominal ascites. Electronically Signed   By: Ulyses Jarred M.D.   On: 12/26/2016 21:53   Dg Chest Port 1 View  Result Date: 12/26/2016 CLINICAL DATA:  Dyspnea, respiratory distress. EXAM: PORTABLE CHEST 1 VIEW COMPARISON:  11/07/2016 FINDINGS: Patient rotated to the right. Lungs are adequately inflated demonstrate prominent perihilar markings suggesting mild vascular congestion. Stable hazy left base/ retrocardiac opacification. Moderate stable cardiomegaly. Calcified plaque over the aortic arch. Remainder of the exam is unchanged. IMPRESSION: Moderate stable cardiomegaly with suggestion of vascular congestion. Could not exclude effusions/atelectasis versus infection in the left base. Electronically Signed   By: Marin Olp M.D.   On: 12/26/2016 15:30      Medications:     Current Medications: . carvedilol  6.25 mg Oral BID WC  . [START ON 12/28/2016] diltiazem  240 mg Oral Daily  . ferrous sulfate  325 mg Oral TID WC  . furosemide  160 mg Intravenous Q6H  . [START ON 12/28/2016] metolazone  5 mg Oral Daily  . senna-docusate  2 tablet Oral QHS  . sodium chloride flush  3 mL Intravenous Q12H  . warfarin  5 mg Oral ONCE-1800  . Warfarin -  Pharmacist Dosing Inpatient   Does not apply q1800       Assessment/Plan   1. Acute on chronic diastolic CHF 2. Chronic atrial fibrillation on Coumadin 3. Acute on chronic kidney disease stage IV 4. Obesity hypoventilation syndrome.  5. Hyperkalemia 6. Acute RV failure/cor pulmonale 7. OSA, noncompliant with CPAP  Paige Marshall has had multiple admissions (5 in the last 6 months) for acute on chronic diastolic CHF. Admitted on 12/26/16 with weight up to 411 pounds, last discharge weight was 370 pounds. Now with worsening renal function. Continue to aggressively diurese with 160 mg Lasix q 6 hours and metolazone. Would benefit from renal consult eventually.   To further assess volume status and cardiac output, would benefit from swan placement.    Length of Stay: [place], NP  12/27/2016, 11:24 AM  Advanced Heart Failure Team Pager [phone] (M-F; 7a - 4p)  Please contact [facility] for night-coverage after hours (4p -7a ) and weekends on [email or web address]  Patient seen and examined with Jettie Booze, NP. We discussed all aspects of the encounter. I agree with the assessment and plan as stated above.   I have reviewed echo images personally. EF 45-50% Dilated RV with moderate HK. Flat septum with markedly dilated IVC c/w pressure/volume overload  Paige Marshall has end-stage RV failure due to morbid obesity, OSA/OHS and possible diastolic dysfunction. She now has massive volume overload and worsening renal function c/w cardiorenal syndrome. Agree with increasing lasix. Will add  Milrinone for RV support. Will place subclavian central line to help manage co-ox. Will eventually need RHC.   Suspect AF may also be contributing. Will wash out propafenone. Start amio in 24-48 hours. And plan DC-CV prior to d/c.  Long talk about need for severity of her condition and need for weight loss and compliance with CPAP. Would check ABG at some point   Will move to SDU.   If renal function  continues to deteriorate will ask Renal to see.   Leiann Sporer,MD 2:10 PM

## 2016-12-27 NOTE — Progress Notes (Signed)
Progress Note  Patient Name: Paige Marshall Date of Encounter: 12/27/2016  Primary Cardiologist: Dr Gwenlyn Found  Subjective   Dyspnea improving; remains with tight feeling in abdomen; no chest pain  Inpatient Medications    Scheduled Meds: . carvedilol  6.25 mg Oral BID WC  . diltiazem  180 mg Oral Daily  . ferrous sulfate  325 mg Oral TID WC  . furosemide  80 mg Intravenous BID  . metolazone  2.5 mg Oral Daily  . propafenone  150 mg Oral Q8H  . senna-docusate  2 tablet Oral QHS  . sodium chloride flush  3 mL Intravenous Q12H   Continuous Infusions:  PRN Meds: sodium chloride, acetaminophen, ondansetron (ZOFRAN) IV, oxyCODONE, polyethylene glycol, promethazine, sodium chloride flush, zolpidem   Vital Signs    Vitals:   12/27/16 0000 12/27/16 0457 12/27/16 0500 12/27/16 0815  BP: (!) 100/55  (!) 148/89 107/80  Pulse: 91  (!) 136 (!) 127  Resp: 20  20 20   Temp: 98.4 F (36.9 C)  98.1 F (36.7 C) 97.5 F (36.4 C)  TempSrc: Oral  Oral Oral  SpO2: 95%  100% 98%  Weight:  (!) 411 lb (186.4 kg)    Height:        Intake/Output Summary (Last 24 hours) at 12/27/16 0933 Last data filed at 12/27/16 0853  Gross per 24 hour  Intake              320 ml  Output               50 ml  Net              270 ml   Filed Weights   12/26/16 1848 12/27/16 0457  Weight: (!) 407 lb 8 oz (184.8 kg) (!) 411 lb (186.4 kg)    Telemetry    Atrial fibrillation with RVR- Personally Reviewed   Physical Exam   GEN: Morbidly obese; No acute distress.  Anasarca Neck: supple Cardiac: irregular and tachycardic Respiratory: mild basilar crackles GI: Soft, nontender, non-distended  MS: 4+ edema Neuro:  Grossly intact   Labs    Chemistry Recent Labs Lab 12/26/16 2124 12/27/16 0158 12/27/16 0810  NA 142 142 144  K 6.2* 6.5* 5.8*  CL 100* 100* 101  CO2 31 29 27   GLUCOSE 108* 95 90  BUN 62* 62* 62*  CREATININE 3.22* 3.32* 3.46*  CALCIUM 9.1 9.4 9.1  GFRNONAA 15* 14* 14*    GFRAA 17* 17* 16*  ANIONGAP 11 13 16*     Hematology Recent Labs Lab 12/26/16 1502 12/27/16 0158 12/27/16 0810  WBC 11.1* 11.4* 10.1  RBC 3.81* 3.77* 3.55*  HGB 9.5* 9.6* 9.2*  HCT 34.6* 35.1* 33.0*  MCV 90.8 93.1 93.0  MCH 24.9* 25.5* 25.9*  MCHC 27.5* 27.4* 27.9*  RDW 24.5* 24.7* 25.0*  PLT 304 213 PENDING     Recent Labs Lab 12/26/16 1507  TROPIPOC 0.01     BNP Recent Labs Lab 12/26/16 1643  BNP 1,116.0*      Radiology    US Renal  Result Date: 12/26/2016 CLINICAL DATA:  Acute on chronic renal failure EXAM: RENAL / URINARY TRACT ULTRASOUND COMPLETE COMPARISON:  None. FINDINGS: Right Kidney: Length: 12.1 cm. Echogenicity within normal limits. No mass or hydronephrosis visualized. Left Kidney: Length: 11.1 cm. Echogenicity within normal limits. No mass or hydronephrosis visualized. Bladder: The bladder is nondistended. The patient voided immediately before the examination. Other: Moderate volume of lower abdominal ascites. IMPRESSION: 1.  Normal appearance of the kidneys. 2. Moderate lower abdominal ascites. Electronically Signed   By: Ulyses Jarred M.D.   On: 12/26/2016 21:53   Dg Chest Port 1 View  Result Date: 12/26/2016 CLINICAL DATA:  Dyspnea, respiratory distress. EXAM: PORTABLE CHEST 1 VIEW COMPARISON:  11/07/2016 FINDINGS: Patient rotated to the right. Lungs are adequately inflated demonstrate prominent perihilar markings suggesting mild vascular congestion. Stable hazy left base/ retrocardiac opacification. Moderate stable cardiomegaly. Calcified plaque over the aortic arch. Remainder of the exam is unchanged. IMPRESSION: Moderate stable cardiomegaly with suggestion of vascular congestion. Could not exclude effusions/atelectasis versus infection in the left base. Electronically Signed   By: Marin Olp M.D.   On: 12/26/2016 15:30    Patient Profile     61 y.o. female with past medical history of morbid obesity, normal coronary arteries by catheterization  in April 2016, paroxysmal atrial fibrillation, chronic stage III kidney disease admitted with acute on chronic diastolic congestive heart failure. Last echocardiogram 09/19/2016 showed ejection fraction of 50-55%, biatrial enlargement, moderate right ventricular enlargement with mildly reduced RV function, moderate tricuspid regurgitation.  Assessment & Plan    1 acute on chronic diastolic congestive heart failure-the patient is massively volume overloaded. Continue Lasix (increase to 120 mg IV TID) and metolazone. Follow renal function. Not sure I and O are accurate at this point. Will ask CHF to follow.  2 atrial fibrillation-this is likely contributing to her congestive heart failure. Her rate is elevated. Continue cardizem (increase to 240 mg daily)  And coreg. CHADSvasc 5; continue Coumadin. Her INR has been therapeutic since early February. Discontinue propafenone. Given renal insufficiency she would not be a candidate for tikosyn or sotalol. Add amiodarone later once propafenone out of her system. Would then proceed with elective cardioversion. Note pt was in sinus 12/17.  3 acute on chronic stage III kidney disease-her renal function is worse. However she remains massively volume overloaded. Continue diuresis. Would ask nephrology to see.  4 morbid obesity-p instructed on importance of weight loss.  5 hyperkalemia-some improvement. Potassium has been held. Continue to follow.  Signed, Kirk Ruths, MD  12/27/2016, 9:33 AM

## 2016-12-27 NOTE — Procedures (Signed)
Central Venous Catheter Insertion Procedure Note Paige Marshall [id number] 1956/03/14  Procedure: Insertion of Central Venous Catheter Indications: Assessment of intravascular volume  Procedure Details Consent: Risks of procedure as well as the alternatives and risks of each were explained to the (patient/caregiver).  Consent for procedure obtained.2 Time Out: Verified patient identification, verified procedure, site/side was marked, verified correct patient position, special equipment/implants available, medications/allergies/relevent history reviewed, required imaging and test results available.  Performed  Maximum sterile technique was used including antiseptics, cap, gloves, gown, hand hygiene, mask and sheet. Skin prep: Chlorhexidine; local anesthetic administered A antimicrobial bonded/coated triple lumen catheter was placed in the right subclavian vein using the Seldinger technique.  Evaluation Blood flow good Complications: No apparent complications Patient did tolerate procedure well. Chest X-ray ordered to verify placement.  CXR: normal.  Glori Bickers MD 12/27/2016, 8:58 PM

## 2016-12-27 NOTE — Progress Notes (Signed)
Patient is refusing the use of BIPAP for tonight stating she is claustrophobic and is unable to tolerate the mask. RT informed patient if she changes her mind have RN contact RT. Will continue to monitor.

## 2016-12-27 NOTE — H&P (Signed)
Internal Medicine Attending Admission Note  I saw and evaluated the patient. I reviewed the resident's note and I agree with the resident's findings and plan as documented in the resident's note.  Assessment & Plan by Problem:   Acute on chronic combined systolic and diastolic CHF (congestive heart failure) (HCC) - Currently treated with IV lasix 80mg  BID.  No urine output documented.  Patient does feel slightly improved after lasix, also feels like she is urinating - Will have nursing flush foley and check bladder scans q6h to ensure proper placement of foley - Add low dose metolazone -appreciate cardiology assistane    Acute kidney injury superimposed on CKD Stage 3 ([place]) - No hydronephrosis on renal U/S -Suspect cardiorenal component. -May need Nephrology consult if remains anuric after checking foley has proper placement.    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  He had planned to obtain full PFTs however it does not appear this ever happened.  Hyperkalemia - Improved, treated with kayexalate and lasix  Thoracic aortic atherosclerosis - Noted on CXR  Chronic respiratory failure with hypoxia - Currently on home O2.  Chief Complaint(s): SOB  History - key components related to admission: Briefly Paige Marshall D60-year-old female with past medical history of chronic respiratory failure on 3 L of supplemental oxygen via nasal cannula at home, combined congestive heart failure, morbid obesity, obstructive sleep apnea,  COPD, hypertension, and chronic kidney disease stage III.  She presented to the emergency department via EMS from her skilled nursing facility with chief complaint of increasing shortness of breath. She has had multiple admissions recently for congestive heart failure and her volume status has been difficult to control due to intermittent worsening of her renal function.  On March 7 she was switched to IV Lasix with 2.5 mg of oral metolazone by her cardiologist Dr. Gwenlyn Found.  However at some point this was discontinued by her facility due to increasing creatinine. She was noted to follow-up with her cardiologist on March 14 and transition to 80 mg of Lasix oral twice daily and checks to continue 2.5 mg of metolazone.  During this time patient reports that she has had increasing shortness of breath both with exertion and while resting she also feels increased abdominal fullness which has limited her mobility.  In the ED her weight was noted to be 411 pounds which is increased from 392 5 days ago at her cardiologist appointment and well above 370 back in February.  A BNP was also checked in the emergency department and returned at 1100.  She was given 80 mg of IV Lasix in the ED and internal medicine was asked to admit.  Additionally she was noted to have AK I as well as hyperkalemia on her initial blood work. Overnight she was not noted to have little urine output, a renal ultrasound was obtained and showed no signs of hydronephrosis, a Foley was placed to monitor her urine output. This morning on rounds she reports that she feels that her shortness of breath slightly improved. She  feels like she is urinating at times and it is painful to do so. There is no urine noted in the Foley bag.   Lab results: Reviewed in Epic CXR: personally reviewed: atherosclerosis of aortic arch, pulmonary vascular congestion, cardiomegaly.  EKG: personally reviewed: A fib, right axis deviation, nonspecific ventricular conduction  delay  Physical Exam - key components related to admission: General: resting in bed, morbidly obese,  HEENT:  no scleral icterus Cardiac: irr irr, no rubs, murmurs or gallops, unable to appreciate any JVD Pulm: decreased breath sounds bilaterally Abd: soft, nontender, nondistended, BS present, abdominal wall edema present Ext: warm and well perfused, 2+ pedal edema of bilateral lower extremites Neuro: alert and oriented X3, cranial nerves II-XII grossly intact   Vitals:   12/27/16 0000 12/27/16 0457 12/27/16 0500 12/27/16 0815  BP: (!) 100/55  (!) 148/89 107/80  Pulse: 91  (!) 136 (!) 127  Resp: 20  20 20   Temp: 98.4 F (36.9 C)  98.1 F (36.7 C) 97.5 F (36.4 C)  TempSrc: Oral  Oral Oral  SpO2: 95%  100% 98%  Weight:  (!) 411 lb (186.4 kg)    Height:

## 2016-12-27 NOTE — Progress Notes (Signed)
Subjective: Currently, the patient feels somewhat improved in terms of SOB and abd tightness. She complains of pain w/ urination, but has a foley catheter in place. She has not produced much urine ON.  Interval Events: UOP minimal (~70mL). Wt increased ON 4 lbs.  Objective: Vital signs in last 24 hours: Vitals:   12/27/16 0000 12/27/16 0457 12/27/16 0500 12/27/16 0815  BP: (!) 100/55  (!) 148/89 107/80  Pulse: 91  (!) 136 (!) 127  Resp: 20  20 20   Temp: 98.4 F (36.9 C)  98.1 F (36.7 C) 97.5 F (36.4 C)  TempSrc: Oral  Oral Oral  SpO2: 95%  100% 98%  Weight:  (!) 411 lb (186.4 kg)    Height:       24-hour weight change: Filed Weights   12/26/16 1848 12/27/16 0457  Weight: (!) 407 lb 8 oz (184.8 kg) (!) 411 lb (186.4 kg)   Dry Wt: 370 lbs  Intake/Output:  03/18 0701 - 03/19 0700 In: 220 [P.O.:220] Out: -     Physical Exam: Physical Exam  Constitutional: She is oriented to person, place, and time. She appears well-developed. She is cooperative. No distress.  Morbidly obese female, lying in bed w/o acute distress.  Neck: No JVD (JVD could not be appreciated d/t body habitus) present.  Cardiovascular: Normal rate, regular rhythm, normal heart sounds and normal pulses.  Exam reveals no gallop.   No murmur heard. Pulmonary/Chest: No respiratory distress. She has no wheezes. She has rales (bibasilar). Breasts are symmetrical.  Wearing O2 w/ mild increase in WOB.  Abdominal: Soft. Bowel sounds are normal. There is no tenderness.  Musculoskeletal: She exhibits edema (pitting to shoulders, 3+ in BLE).  Neurological: She is alert and oriented to person, place, and time.  Skin: Skin is warm.   Labs: CBC:  Recent Labs Lab 12/26/16 1502 12/27/16 0158 12/27/16 0810  WBC 11.1* 11.4* 10.1  NEUTROABS 8.1*  --   --   HGB 9.5* 9.6* 9.2*  HCT 34.6* 35.1* 33.0*  MCV 90.8 93.1 93.0  PLT 304 948 546   Metabolic Panel:  Recent Labs Lab 12/26/16 1502 12/26/16 2124  12/27/16 0158 12/27/16 0810  NA 143 142 142 144  K 6.3* 6.2* 6.5* 5.8*  CL 101 100* 100* 101  CO2 28 31 29 27   GLUCOSE 100* 108* 95 90  BUN 61* 62* 62* 62*  CREATININE 3.03* 3.22* 3.32* 3.46*  CALCIUM 9.3 9.1 9.4 9.1  LABPROT  --   --   --  30.4*  INR  --   --   --  2.84   Cardiac Labs:  Recent Labs Lab 12/26/16 1507 12/26/16 1643  TROPIPOC 0.01  --   BNP  --  1,116.0*   BG:  Recent Labs Lab 12/26/16 1645  GLUCAP 88   Lab Results  Component Value Date   HGBA1C  10/02/2009    5.6    Medications: Scheduled Medications: . carvedilol  6.25 mg Oral BID WC  . [START ON 12/28/2016] diltiazem  240 mg Oral Daily  . ferrous sulfate  325 mg Oral TID WC  . furosemide  160 mg Intravenous Q6H  . [START ON 12/28/2016] metolazone  5 mg Oral Daily  . senna-docusate  2 tablet Oral QHS  . sodium chloride flush  3 mL Intravenous Q12H  . warfarin  5 mg Oral ONCE-1800  . Warfarin - Pharmacist Dosing Inpatient   Does not apply q1800   PRN Medications: sodium chloride, acetaminophen, ondansetron (  ZOFRAN) IV, oxyCODONE, polyethylene glycol, promethazine, sodium chloride flush, zolpidem  Assessment/Plan: [name] is a 61 y.o. female with COPD, CKD, AF, CHF who presents with hypervolemia and CHF exacerbation.  1) Combined CHF Exacerbation: Dyspnea improved today, but still grossly volume overloaded. Received 80mg  Lasix IV x 2 since arrival and has produced only minimal urine. Concern for oliguric renal failure limiting ability to diurese. Appreciate cardiology recs, will get HF team involved with Nephrology assistance. - continue tele - increase Lasix to IV 120mg  TID - add metolazone 5mg  qD - follow UOP w/ foley, strict I/Os - daily wts - fluid restriction - HF c/s, appreciate recs  2) Acute oliguric renal failure on CKD III/IV: #Hyperkalemia: SCr continues to rise 3.0-->3.4 from b/l around 1.8-2.0. K initially elevated, tx w/ Lasix and Insulin/Dextrose w/o  significant success 6.3--6.5, given Kayaxalate x2 w/ some response to 5.8 this AM. No EKG changes. Initially concern that diuresis had caused SCr to bump, but now with worsened volume, suspect cardiorenal syndrome and reduced effective renal perfusion. No indications of obstruction on renal US, UA negative for concern of infection. UOP has been minimal since admission. May be unable to tolerate diuresis, and require emergent HD for volume and electrolytes. - continue Lasix as above for now - Nephrology c/s, appreciate recs - Kayaxelate for hyperK - trend BMP BID  3) PAF: In afib on presentation. Also w/ wide QRS. AC w/ coumadin 5mg  qD. INR supertherapeutic at 4.12. Currently with rate control of dilt XR 180mg  qD and carvedilol 6.25 BID. Rhythm control w/ propafenone at home, cards recs d/c. - continue rate control w/ home meds - Coumadin per Pharm - trend INR - Cardiology c/s, appreciate recs  4) COPD: Unclear severity, no PFTs on record. On home O2 3L [place], reports that she only uses at night, now requiring 24h. No sx of acute exacerbation presently.  5) Anemia: Hgb 9. Stable. Normocytic. No signs/symptoms of acute BL. Fe low 11/09/16. Significant CKD, likely epo failure. Continue to monitor and give Fe supplementation.  6) HTN: Soft on presentation but improved ON. Home meds: Coreg, dilt, furosemide, metolazone. Will continue for diuresis and rate control.  7) OHS / OSA: Pt has OSA and OHS but doe snot wear CPAP or BiPap regularly d/t intolerance of the mask. This is likely exacerbating her symptoms and we will offer her BiPap during this admission qHS.  Length of Stay: 1 day(s) Dispo: Anticipated discharge pending resolution on CHF exacerbation and renal failure.  Holley Raring, MD Pager: [phone] (7AM-5PM) 12/27/2016, 10:53 AM

## 2016-12-27 NOTE — Progress Notes (Signed)
K 6.5, MD notified, will continue to monitor, Thanks Arvella Nigh RN.

## 2016-12-27 NOTE — Progress Notes (Signed)
Subjective: Today Ms. Lavallie reported that her SOB and abdominal tightness were somewhat improved. She reported difficulty urinating and pain with urination through her catheter. She did not realize it, but she produced a low urine volume over night (about 57ml).  Objective: Vital signs in last 24 hours: Vitals:   12/27/16 0000 12/27/16 0457 12/27/16 0500 12/27/16 0815  BP: (!) 100/55  (!) 148/89 107/80  Pulse: 91  (!) 136 (!) 127  Resp: 20  20 20   Temp: 98.4 F (36.9 C)  98.1 F (36.7 C) 97.5 F (36.4 C)  TempSrc: Oral  Oral Oral  SpO2: 95%  100% 98%  Weight:  (!) 186.4 kg (411 lb)    Height:       Filed Weights   12/26/16 1848 12/27/16 0457  Weight: (!) 184.8 kg (407 lb 8 oz) (!) 186.4 kg (411 lb)    Weight change:  186.4- 184.8 = +1.6 kg  Intake/Output Summary (Last 24 hours) at 12/27/16 1450 Last data filed at 12/27/16 1346  Gross per 24 hour  Intake              440 ml  Output               50 ml  Net              390 ml   Physical Exam: General: morbidly obese, no acute distress HEENT: oral pharynx clear, mucus membranes moist, normal hearing Cardiovascular: No appreciable JVD due to body habitus. irregularly irregular rhythm, no murmur, distal pulses intact Pulmonary: mildly increased work of breathing (wearing O2 ), distant breaths sounds, no wheezes, bibasilar crackles, no tenderness to palpation Abdominal: Soft, non tender. Bowel sounds present. No hepatosplenomegaly. Extremities: normal range of motion, bilateral 3+ pitting edema Neurological: alert and oriented x3, normal strength Skin: skin is warm and dry, anisocoria  Psych: somnolent (falling asleep during interview)  Lab Results: K+ = 5.8 (this is trending down after introduction of kayaxelate to precipitate K+ into her stool, she has not diuresed much fluid to remove K+ in her urine) HGB = 9.2 (continues to have mild normocytic anemia, she does not have clinical signs of bleeding so this is possibly  due to decreased epo production in CKD) BUN = 62 Creatinine = 3.46 (her kidney function continues to deteriorate, we will continue to monitor creatinine level as a measaure for GFR) BNP = 1116.0 (her BNP remains elevated above her baseline around 200-400 indicating exacerbation of CHF and injury to her heart)  Studies/Results: US Renal  Result Date: 12/26/2016 CLINICAL DATA:  Acute on chronic renal failure EXAM: RENAL / URINARY TRACT ULTRASOUND COMPLETE COMPARISON:  None. FINDINGS: Right Kidney: Length: 12.1 cm. Echogenicity within normal limits. No mass or hydronephrosis visualized. Left Kidney: Length: 11.1 cm. Echogenicity within normal limits. No mass or hydronephrosis visualized. Bladder: The bladder is nondistended. The patient voided immediately before the examination. Other: Moderate volume of lower abdominal ascites. IMPRESSION: 1. Normal appearance of the kidneys. 2. Moderate lower abdominal ascites. Electronically Signed   By: Ulyses Jarred M.D.   On: 12/26/2016 21:53   Dg Chest Port 1 View  Result Date: 12/26/2016 CLINICAL DATA:  Dyspnea, respiratory distress. EXAM: PORTABLE CHEST 1 VIEW COMPARISON:  11/07/2016 FINDINGS: Patient rotated to the right. Lungs are adequately inflated demonstrate prominent perihilar markings suggesting mild vascular congestion. Stable hazy left base/ retrocardiac opacification. Moderate stable cardiomegaly. Calcified plaque over the aortic arch. Remainder of the exam is unchanged. IMPRESSION: Moderate stable cardiomegaly  with suggestion of vascular congestion. Could not exclude effusions/atelectasis versus infection in the left base. Electronically Signed   By: Marin Olp M.D.   On: 12/26/2016 15:30   Medications:  Scheduled Meds: . amiodarone  150 mg Intravenous Once  . [START ON 12/28/2016] diltiazem  240 mg Oral Daily  . ferrous sulfate  325 mg Oral TID WC  . furosemide  160 mg Intravenous Q6H  . [START ON 12/28/2016] metolazone  5 mg Oral Daily  .  oxybutynin  5 mg Oral BID  . senna-docusate  2 tablet Oral QHS  . sodium chloride flush  3 mL Intravenous Q12H  . warfarin  5 mg Oral ONCE-1800  . Warfarin - Pharmacist Dosing Inpatient   Does not apply q1800   Continuous Infusions: . amiodarone     Followed by  . amiodarone    . milrinone     PRN Meds:.sodium chloride, acetaminophen, ondansetron (ZOFRAN) IV, oxyCODONE, polyethylene glycol, promethazine, sodium chloride flush, zolpidem Assessment/Plan: Principal Problem:   Acute on chronic combined systolic and diastolic CHF (congestive heart failure) (HCC) Active Problems:   Essential hypertension   PAF (paroxysmal atrial fibrillation) (HCC)   Obstructive sleep apnea   Morbid obesity (HCC)   Obesity hypoventilation syndrome (HCC)   COPD mixed type (HCC)   Chronic respiratory failure with hypoxia (HCC)   Acute kidney injury superimposed on CKD ([place])   Hyperkalemia   Thoracic aortic atherosclerosis ([place])  Ms. Hurley is a 61 year old woman with a history of chronic combined systolic/diastolic CHF (EF 45%), COPD, CKD stage III, paroxysmal atrial fibrillation, HTN, morbid obesity, pulmonary hypertension, and OSA who presented today with shortness of breath, bilateral leg swelling, orthopnea, and tightness in her abdomen.   1) Acute on chronic combined systolic and diastolic CHF: Pt was admitted complaining of SOB, bilateral leg swelling, orthopnea, and tightness in her abdomen due to fluid overload. Today her exam showed 3+ peripheral edema, anisocoria. Her abdominal tenderness and SOB have largely resolved but she is still grossly fluid overloaded. Per cardiology recommendation she has received IV lasix BID, but has only produced minial urine (59ml). She complained of intense pain with urination despite having a folly catheter placed, on re-check of the catheter it was able to flush indicating that she is truly not producing urine. This is corroborated by the hyaline casts present on her  UA that demonstrate potential tubular necrosis. We will add metolazone 5mg  QD and increase her IV lasix to 120mg  TID per cardiology recommendation to get her to a goal diuresis of 1.5L per day. This slow steady process will helpfully prevent kidney injury while allowing net fluid loss. We will continue her carvedilol for rate control of her a-fib. We will carefully track her urine output with foley catheter, strict ins and outs, and daily weights. We will consult the heart failure team for an expert recommendation on this highly complex patient.  -continue telemetry increase IV lasix to 120mg  BID -add metolazone 5mg  QD -track urine output with foley catheter, strict ins and outs, and daily weights -fluid restriction -continue carvidelol 6.25 BID -add diltiazem xr 180 QD  2) Acute on chronic kidney disease (hyperkalemia): Ms. Ditommaso K+ was 6.3 on arrival in the ED, but her EKG does not show high peaked T waves or other changes of hyperkalemia. (hyperkalemia can cause arrhythmia by altering cardiomyocyte action potential). Her K+ rose to 6.5 over night then came down to 5.8 this AM in response to [facility] x2. Her baseline sCR  appears to be about 1.8-2.0 (stage III/IV CKD). Her Cr was 3.4 today indicating continued decrease in renal function. We will diurese her for her CHF which will help to reduce K+. Renal ultrasound ruled out obstruction. We will also need to watch her BUN and creatinine carefully as we begin to diurese her to ensure she does not get acute tubular necrosis due to hypoperfusion if we decrease her BP to treat the heart failure. Her UA showed hyaline casts which can be a sign of tubular injury, this is consistent with her decreased urine output of 48ml overnight. We will consult nephrology. She was temporized with lasix and insulin/dextrose in the ED, given her volume overloaded status indicating decreased fluid output we will not give her more insulin. Kayaxelate is more appropriate to  precipitate K+ into her stool. We will monitor her K+ with BMP BID. -Kayaxelate -trend BMP BID -treat volume overload as above w/ diuresis -consult nephrology, appreciate recs  3) Paroxysmal atrial fibrillation: Pt reports a constant state of a-fib rate, and noticeable palpitations without carvedilol rate control BID. She is in A-fib on exam. Her EKG demonstrates a widened QRS interval. Her INR is now 2.84 and we will continue her warfarin per Pharm. She takes diltiazem xr 180mg  qD and carvedilol 6.25 BID. We will continue her carvedilol to control her rate, and add diltiazem for BP control per cardiology recommendation. She is rhythm controlled with class IC antiarrythmic propafenone, we will discontinue this per cardiology recommendation. -continue carvidelol 6.25 BID -start diltiazem XR 180mg  QD -discontinue propafenone -restart coumadin per Pharm -trend INR   4) COPD: Unclear severity, she had PFTs ordered by Dr. Annamaria Boots on 10/24/2015 but they never occurred. We will try to return the pt to her baseline and order PFTs when she is an outpatient, because her lung disease is not well characterized. Her white count has resolved to 10.3.  There were no wheezes or signs of acute exacerbation on exam. CHF exacerbation is a better explanation of her SOB. She is on 3L of O2 nasal canula here and at home. She does not take disease modifying agents, only albuterol.  -continue O2 3L [place]  5) Anemia: Her hemoglobin is 9.2 and she has a mild normocytic anemia. She has no signs/symptoms of acute blood loss (denies hematuria, melena, hematochezia), she appears well perfused (warm). She may be experiencing a hemolytic process due to her CKD or potentially decreased red cell production due to loss of appropriate epo production and bone marrow stimulation. We will monitor and give Fe supplementation.  -continue home Fe  6) HTN - soft on presentation, but overall improved on home meds. Home meds:carvedilol,  diltiazem, lasix, metolazone. We will continue her carvedilol and lasix, which should provide appropriate HTN control for now until we hear back from cardiology consult. We will continue her diltiazem per cardiology recommendation.  -carvidelol 6.25 BID -IV lasix 80mg  BID -add diltiazem   7) obstructive sleep apnea: Ms. Kapusta has diagnosed obstructive sleep apnea, exacerbated by obesity hypoventilation syndrome. She does not wear a cPAP at home which exacerbates her HTN and in turn her CHF due to metabolic acidosis and CO2 buildup. She needs to be on BiPAP every night, but she states she is intolerent of the mask. We will offer her BiPAP during this admission. -BiPAP   DVT PPx- low molecular weight heparin Code Status- discussed with PT, Full code Consults placed- cardiology This is a Careers information officer Note.  The care of the patient was discussed  with Dr. Gay Filler and the assessment and plan formulated with their assistance.  Please see their attached note for official documentation of the daily encounter.   LOS: 1 day  Discharge: Pt requires intensive management by nephrology and cardiology and will remain in the hospital pending resolution of CHF exacerbation and renal failure.  Alta Corning, Medical Student 12/27/2016, 2:50 PM

## 2016-12-27 NOTE — Progress Notes (Signed)
ANTICOAGULATION CONSULT NOTE - Initial Consult  Pharmacy Consult for warfarin Indication: atrial fibrillation  Allergies  Allergen Reactions  . Penicillins Hives, Itching, Swelling and Other (See Comments)    Tongue swelling Has patient had a PCN reaction causing immediate rash, facial/tongue/throat swelling, SOB or lightheadedness with hypotension: Yes  Clarified with the patient her penicillin allergy. Pt has tolerated cefepime in the past. Pt also states that she can take amoxicillin.    . Citrus Hives   Patient Measurements: Height: 5' 7.5" (171.5 cm) Weight: (!) 411 lb (186.4 kg) (bed; rn in room; reweighed twice) IBW/kg (Calculated) : 62.75   Vital Signs: Temp: 97.5 F (36.4 C) (03/19 0815) Temp Source: Oral (03/19 0815) BP: 107/80 (03/19 0815) Pulse Rate: 127 (03/19 0815)  Labs:  Recent Labs  12/26/16 1502 12/26/16 2124 12/27/16 0158 12/27/16 0810  HGB 9.5*  --  9.6* 9.2*  HCT 34.6*  --  35.1* 33.0*  PLT 304  --  213 PENDING  LABPROT  --   --   --  30.4*  INR  --   --   --  2.84  CREATININE 3.03* 3.22* 3.32* 3.46*   Estimated Creatinine Clearance: 31.4 mL/min (A) (by C-G formula based on SCr of 3.46 mg/dL (H)).  Medical History: Past Medical History:  Diagnosis Date  . Arthritis    "both knees" (01/14/2015)  . Asthma   . Chronic combined systolic and diastolic CHF (congestive heart failure) ([place])   . CKD (chronic kidney disease), stage III   . COPD (chronic obstructive pulmonary disease) ([place])   . Degenerative joint disease of both lower legs   . Dyspnea   . Edema    Lower extremity  . Former smoker   . Gout    hx in "both knees" (01/14/2015)  . H. pylori infection 01/03/2014   +breath test.    . High cholesterol   . Hypertension   . Morbid obesity ([place])   . NICM (nonischemic cardiomyopathy) ([place])    a. LHC 01/2015: normal cors, EF 45-50%. b. EF 50-55% in 08/2016.  [name] Nonischemic cardiomyopathy ([place])   . Normal coronary arteries   . OSA  (obstructive sleep apnea)    "never sent CPAP out" (01/14/2015)  . Paroxysmal atrial fibrillation ([facility])   . Pneumonia X 2  . Pulmonary hypertension   . Respiratory failure ([place])   . Rheumatoid arthritis ([place])   . Stroke [facility])    Medications:  Prescriptions Prior to Admission  Medication Sig Dispense Refill Last Dose  . ARTIFICIAL TEAR OP Apply 1 drop to eye every 6 (six) hours as needed (dry eyes).    at prn  . carvedilol (COREG) 6.25 MG tablet Take 1 tablet (6.25 mg total) by mouth 2 (two) times daily with a meal.   12/26/2016 at 0900  . diclofenac sodium (VOLTAREN) 1 % GEL Apply 4 g topically every 6 (six) hours as needed (pain).     at prn  . diltiazem (DILACOR XR) 180 MG 24 hr capsule Take 180 mg by mouth daily.   12/26/2016 at 0900  . docusate sodium (COLACE) 100 MG capsule Take 1 capsule (100 mg total) by mouth 2 (two) times daily. 10 capsule 0 12/26/2016 at 0900  . ferrous sulfate 325 (65 FE) MG tablet Take 1 tablet (325 mg total) by mouth 3 (three) times daily with meals.  3 12/26/2016 at 0900  . furosemide (LASIX) 40 MG tablet Take 1 tablet (40 mg total) by mouth 2 (two) times daily. (  Patient taking differently: Take 80 mg by mouth 2 (two) times daily. ) 60 tablet 0 12/26/2016 at 0900  . metolazone (ZAROXOLYN) 2.5 MG tablet Take 1 tablet (2.5 mg total) by mouth daily. 90 tablet 1 12/26/2016 at 0830  . Multiple Vitamin (MULTIVITAMIN WITH MINERALS) TABS tablet Take 1 tablet by mouth daily.   12/26/2016 at 0900  . omega-3 acid ethyl esters (LOVAZA) 1 g capsule Take 1 g by mouth 2 (two) times daily.   12/26/2016 at 0900  . oxyCODONE (OXY IR/ROXICODONE) 5 MG immediate release tablet Take 1 tablet (5 mg total) by mouth every 6 (six) hours as needed for breakthrough pain. 15 tablet 0 12/25/2016 at 1537  . polyethylene glycol (MIRALAX / GLYCOLAX) packet Take 17 g by mouth daily as needed. (Patient taking differently: Take 17 g by mouth daily as needed (constipation). Mix in 8 oz liquid and drink) 14  each 0  at prn  . potassium chloride SA (K-DUR,KLOR-CON) 20 MEQ tablet Take 40 mEq by mouth 3 (three) times daily.    12/26/2016 at 1300  . predniSONE (DELTASONE) 10 MG tablet Take 40 mg tablet 11/14/2016 and taper down by 10 mg daily until completed 15 tablet 0 12/25/2016 at 1700  . PROAIR HFA 108 (90 Base) MCG/ACT inhaler INHALE 2 PUFFS EVERY 6 HOURS AS NEEDED FOR WHEEZING OR SHORTNESS OF BREATH. 8.5 Inhaler 1  at prn  . promethazine (PHENERGAN) 25 MG tablet Take 25 mg by mouth every 6 (six) hours as needed for nausea or vomiting.    at prn  . propafenone (RYTHMOL) 150 MG tablet Take 1 tablet (150 mg total) by mouth every 8 (eight) hours. 10 tablet 0 12/26/2016 at 0800  . senna-docusate (SENNA S) 8.6-50 MG tablet Take 2 tablets by mouth See admin instructions. Take 2 tablet by mouth daily at bedtime, may also take 2 tablets as needed for constipation   12/25/2016 at 2100  . warfarin (COUMADIN) 5 MG tablet Take 5 mg by mouth daily.   12/25/2016 at 1700  . zolpidem (AMBIEN) 5 MG tablet Take 5 mg by mouth at bedtime as needed for sleep.    at prn  . bisacodyl (DULCOLAX) 5 MG EC tablet Take 1 tablet (5 mg total) by mouth daily as needed for moderate constipation. (Patient not taking: Reported on 12/26/2016) 30 tablet 0 Not Taking at Unknown time  . PRESCRIPTION MEDICATION Inhale into the lungs at bedtime. BIPAP   Taking   Assessment: Ms. Lejeune is a 61 y.o.morbidly obese female with complex medical history on warfarin prior to admission for atrial fibrillation. Last dose take 12/25/16. INR therapeutic this morning at 2.84. Patient is severely volume overloaded at his time, however cardiology is recommending cardioversion. Pharmacy has been asked to dose warfarin at this time. Hgb low 9.2, but stable compared to recent labs. No bleeding reported. Will resume prior to admission dose scheduled for this evening.   PTA warfarin: 5mg  daily  Goal of Therapy:  INR 2-3 Monitor platelets by anticoagulation protocol:  Yes   Plan:  Warfarin 5mg  x1 tonight Daily INR/CBC Monitor for s/sx of bleeding  Georga Bora, PharmD Clinical Pharmacist Pager: [phone] 12/27/2016 10:03 AM

## 2016-12-28 DIAGNOSIS — I1310 Hypertensive heart and chronic kidney disease without heart failure, with stage 1 through stage 4 chronic kidney disease, or unspecified chronic kidney disease: Secondary | ICD-10-CM | POA: Diagnosis present

## 2016-12-28 LAB — BASIC METABOLIC PANEL
Anion gap: 9 (ref 5–15)
Anion gap: 11 (ref 5–15)
BUN: 65 mg/dL — ABNORMAL HIGH (ref 6–20)
BUN: 62 mg/dL — AB (ref 6–20)
CALCIUM: 8.8 mg/dL — AB (ref 8.9–10.3)
CHLORIDE: 100 mmol/L — AB (ref 101–111)
CHLORIDE: 98 mmol/L — AB (ref 101–111)
CO2: 34 mmol/L — ABNORMAL HIGH (ref 22–32)
CO2: 33 mmol/L — ABNORMAL HIGH (ref 22–32)
CREATININE: 3.7 mg/dL — AB (ref 0.44–1.00)
Calcium: 8.9 mg/dL (ref 8.9–10.3)
Creatinine, Ser: 3.68 mg/dL — ABNORMAL HIGH (ref 0.44–1.00)
GFR calc Af Amer: 15 mL/min — ABNORMAL LOW (ref >60)
GFR calc non Af Amer: 13 mL/min — ABNORMAL LOW (ref >60)
GFR calc non Af Amer: 13 mL/min — ABNORMAL LOW (ref >60)
GFR, EST AFRICAN AMERICAN: 15 mL/min — AB (ref >60)
Glucose, Bld: 142 mg/dL — ABNORMAL HIGH (ref 65–99)
Glucose, Bld: 121 mg/dL — ABNORMAL HIGH (ref 65–99)
POTASSIUM: 4.5 mmol/L (ref 3.5–5.1)
Potassium: 4.4 mmol/L (ref 3.5–5.1)
SODIUM: 143 mmol/L (ref 135–145)
SODIUM: 142 mmol/L (ref 135–145)

## 2016-12-28 LAB — PROTIME-INR
INR: 2.92
INR: 2.99
PROTHROMBIN TIME: 31.2 s — AB (ref 11.4–15.2)
Prothrombin Time: 31.7 seconds — ABNORMAL HIGH (ref 11.4–15.2)

## 2016-12-28 LAB — CBC
HCT: 28.1 % — ABNORMAL LOW (ref 36.0–46.0)
Hemoglobin: 7.9 g/dL — ABNORMAL LOW (ref 12.0–15.0)
MCH: 25.4 pg — ABNORMAL LOW (ref 26.0–34.0)
MCHC: 28.1 g/dL — ABNORMAL LOW (ref 30.0–36.0)
MCV: 90.4 fL (ref 78.0–100.0)
PLATELETS: 207 10*3/uL (ref 150–400)
RBC: 3.11 MIL/uL — ABNORMAL LOW (ref 3.87–5.11)
RDW: 27.4 % — AB (ref 11.5–15.5)
WBC: 9.3 10*3/uL (ref 4.0–10.5)

## 2016-12-28 LAB — COOXEMETRY PANEL
Carboxyhemoglobin: 3.4 % — ABNORMAL HIGH (ref 0.5–1.5)
METHEMOGLOBIN: 0.5 % (ref 0.0–1.5)
O2 Saturation: 69.2 %
TOTAL HEMOGLOBIN: 8.1 g/dL — AB (ref 12.0–16.0)

## 2016-12-28 LAB — MAGNESIUM: MAGNESIUM: 2.1 mg/dL (ref 1.7–2.4)

## 2016-12-28 MED ORDER — AMIODARONE LOAD VIA INFUSION
150.0000 mg | Freq: Once | INTRAVENOUS | Status: AC
  Administered 2016-12-28: 150 mg via INTRAVENOUS
  Filled 2016-12-28: qty 83.34

## 2016-12-28 MED ORDER — SALINE SPRAY 0.65 % NA SOLN
2.0000 | NASAL | Status: DC | PRN
  Administered 2016-12-28 – 2017-01-02 (×2): 2 via NASAL
  Filled 2016-12-28 (×3): qty 44

## 2016-12-28 MED ORDER — METOLAZONE 5 MG PO TABS
5.0000 mg | ORAL_TABLET | Freq: Once | ORAL | Status: AC
  Administered 2016-12-28: 5 mg via ORAL
  Filled 2016-12-28: qty 1

## 2016-12-28 MED ORDER — DARBEPOETIN ALFA 150 MCG/0.3ML IJ SOSY
150.0000 ug | PREFILLED_SYRINGE | INTRAMUSCULAR | Status: DC
  Administered 2016-12-28 – 2017-01-18 (×3): 150 ug via SUBCUTANEOUS
  Filled 2016-12-28 (×4): qty 0.3

## 2016-12-28 MED ORDER — FUROSEMIDE 10 MG/ML IJ SOLN
30.0000 mg/h | INTRAVENOUS | Status: DC
  Administered 2016-12-28 – 2016-12-29 (×2): 30 mg/h via INTRAVENOUS
  Filled 2016-12-28 (×5): qty 25

## 2016-12-28 MED ORDER — VITAMIN K1 10 MG/ML IJ SOLN
5.0000 mg | Freq: Once | INTRAVENOUS | Status: AC
  Administered 2016-12-28: 5 mg via INTRAVENOUS
  Filled 2016-12-28: qty 0.5

## 2016-12-28 MED ORDER — METOLAZONE 5 MG PO TABS
10.0000 mg | ORAL_TABLET | Freq: Two times a day (BID) | ORAL | Status: DC
  Administered 2016-12-28 – 2016-12-29 (×2): 10 mg via ORAL
  Filled 2016-12-28 (×2): qty 2

## 2016-12-28 NOTE — Progress Notes (Signed)
Subjective:  Moved to ICU- on milrinone- co ox improved- urine output improved but still overloaded with CVP 20- by BMP at bedside today K down to 4.6 and creatinine stable at 3.5 Objective Vital signs in last 24 hours: Vitals:   12/28/16 0200 12/28/16 0352 12/28/16 0700 12/28/16 0755  BP: (!) 115/59 124/68 111/67 110/77  Pulse: (!) 118 (!) 112 (!) 132 (!) 134  Resp: 15 18 17 16   Temp:  98.3 F (36.8 C)  97.6 F (36.4 C)  TempSrc:  Oral  Oral  SpO2: 94% 93% 94% 93%  Weight:  (!) 187.3 kg (413 lb)    Height:       Weight change: 1.134 kg (2 lb 8 oz)  Intake/Output Summary (Last 24 hours) at 12/28/16 1008 Last data filed at 12/28/16 0700  Gross per 24 hour  Intake          1597.21 ml  Output              650 ml  Net           947.21 ml    Assessment/Plan: 61 year old black female with morbid obesity and Marshall multitude of pulmonary issues who presents with recurrent congestive heart failure and acute on chronic renal failure 1.Renal- acute on chronic renal failure (recent creatinine on 2/3 was 1.97)- most likely due to cardiorenal syndrome/hemodynamics as urinalysis is fairly bland and renal ultrasound is normal. Appreciate heart failure team initiating milrinone in an intensive care unit setting and we will  need to hope that her hemodynamics continue to improve enough to turn around her kidney function. UOP is increasing and kidney function pretty stable but still very overloaded. Increase diuretics to really max including lasix drip There are no absolute indications for dialysis at this time, however volume could become problematic. There are improvements but needs more volume off or will require supportive dialysis in the next 24-48 hours 2. Hypertension/volume  - volume overloaded - UOP better after moving to ICU and initiating milrinone  3. Anemia  - due to frequent blood draws hospitalization as well as CKD. I will check iron stores and replete as needed- consider ESA.  Probably is not  helping her SOB 4. Hyperkalemia-  seems better- BID blood draws    Paige Marshall    Labs: Basic Metabolic Panel:  Recent Labs Lab 12/27/16 0158 12/27/16 0810 12/27/16 1232  NA 142 144 145  K 6.5* 5.8* 5.8*  CL 100* 101 100*  CO2 29 27 31   GLUCOSE 95 90 80  BUN 62* 62* 64*  CREATININE 3.32* 3.46* 3.62*  CALCIUM 9.4 9.1 9.3   Liver Function Tests: No results for input(s): AST, ALT, ALKPHOS, BILITOT, PROT, ALBUMIN in the last 168 hours. No results for input(s): LIPASE, AMYLASE in the last 168 hours. No results for input(s): AMMONIA in the last 168 hours. CBC:  Recent Labs Lab 12/26/16 1502 12/27/16 0158 12/27/16 0810 12/28/16 0500  WBC 11.1* 11.4* 10.1 9.3  NEUTROABS 8.1*  --   --   --   HGB 9.5* 9.6* 9.2* 7.9*  HCT 34.6* 35.1* 33.0* 28.1*  MCV 90.8 93.1 93.0 90.4  PLT 304 213 189 207   Cardiac Enzymes: No results for input(s): CKTOTAL, CKMB, CKMBINDEX, TROPONINI in the last 168 hours. CBG:  Recent Labs Lab 12/26/16 1645  GLUCAP 88    Iron Studies: No results for input(s): IRON, TIBC, TRANSFERRIN, FERRITIN in the last 72 hours. Studies/Results: US Renal  Result Date: 12/26/2016 CLINICAL DATA:  Acute on chronic renal failure EXAM: RENAL / URINARY TRACT ULTRASOUND COMPLETE COMPARISON:  None. FINDINGS: Right Kidney: Length: 12.1 cm. Echogenicity within normal limits. No mass or hydronephrosis visualized. Left Kidney: Length: 11.1 cm. Echogenicity within normal limits. No mass or hydronephrosis visualized. Bladder: The bladder is nondistended. The patient voided immediately before the examination. Other: Moderate volume of lower abdominal ascites. IMPRESSION: 1. Normal appearance of the kidneys. 2. Moderate lower abdominal ascites. Electronically Signed   By: Ulyses Jarred M.D.   On: 12/26/2016 21:53   Dg Chest Port 1 View  Result Date: 12/27/2016 CLINICAL DATA:  Post central line placement. EXAM: PORTABLE CHEST 1 VIEW COMPARISON:  12/26/2016 FINDINGS:  Right-sided central line terminates at the low SVC. Midline trachea. Cardiomegaly accentuated by AP portable technique. Atherosclerosis in the transverse aorta. Mildly degraded exam due to AP portable technique and patient body habitus. No pneumothorax. Suspect mild pulmonary venous congestion. Left lung base and pleural space not well evaluated. No right-sided pleural fluid or consolidation. IMPRESSION: Right-sided central line terminating at the low SVC ; no pneumothorax. Cardiomegaly with suggestion of mild pulmonary venous congestion. Aortic atherosclerosis. Electronically Signed   By: Abigail Miyamoto M.D.   On: 12/27/2016 16:39   Dg Chest Port 1 View  Result Date: 12/26/2016 CLINICAL DATA:  Dyspnea, respiratory distress. EXAM: PORTABLE CHEST 1 VIEW COMPARISON:  11/07/2016 FINDINGS: Patient rotated to the right. Lungs are adequately inflated demonstrate prominent perihilar markings suggesting mild vascular congestion. Stable hazy left base/ retrocardiac opacification. Moderate stable cardiomegaly. Calcified plaque over the aortic arch. Remainder of the exam is unchanged. IMPRESSION: Moderate stable cardiomegaly with suggestion of vascular congestion. Could not exclude effusions/atelectasis versus infection in the left base. Electronically Signed   By: Marin Olp M.D.   On: 12/26/2016 15:30   Medications: Infusions: . amiodarone 30 mg/hr (12/28/16 0700)  . furosemide (LASIX) infusion    . milrinone 0.25 mcg/kg/min (12/28/16 0700)    Scheduled Medications: . ferrous sulfate  325 mg Oral TID WC  . mouth rinse  15 mL Mouth Rinse BID  . metolazone  10 mg Oral BID  . metolazone  5 mg Oral Once  . oxybutynin  5 mg Oral BID  . senna-docusate  2 tablet Oral QHS  . sodium chloride flush  3 mL Intravenous Q12H    have reviewed scheduled and prn medications.  Physical Exam: General: somnolent- worried about peanut butter that she had dropped  Heart:tachy Lungs: CBS bilat Abdomen: soft,non  tender Extremities: pitting edema Dialysis Access: none yet     12/28/2016,10:08 AM  LOS: 2 days

## 2016-12-28 NOTE — Progress Notes (Signed)
RN contacted RT per patients sats dropping. RT placed patient on BIPAP.

## 2016-12-28 NOTE — Progress Notes (Signed)
ANTICOAGULATION CONSULT NOTE - Follow Up Consult  Pharmacy Consult for Coumadin (now holding) Indication: atrial fibrillation  Allergies  Allergen Reactions  . Penicillins Hives, Itching, Swelling and Other (See Comments)    Tongue swelling Has patient had a PCN reaction causing immediate rash, facial/tongue/throat swelling, SOB or lightheadedness with hypotension: Yes  Clarified with the patient her penicillin allergy. Pt has tolerated cefepime in the past. Pt also states that she can take amoxicillin.    . Citrus Hives    Patient Measurements: Height: 5' 7.5" (171.5 cm) Weight: (!) 413 lb (187.3 kg) IBW/kg (Calculated) : 62.75  Vital Signs: Temp: 97.5 F (36.4 C) (03/20 1140) Temp Source: Oral (03/20 1140) BP: 102/57 (03/20 1140) Pulse Rate: 86 (03/20 1140)  Labs:  Recent Labs  12/27/16 0158 12/27/16 0810 12/27/16 1232 12/28/16 0500 12/28/16 0615 12/28/16 1200  HGB 9.6* 9.2*  --  7.9*  --   --   HCT 35.1* 33.0*  --  28.1*  --   --   PLT 213 189  --  207  --   --   LABPROT  --  30.4*  --   --  31.2*  --   INR  --  2.84  --   --  2.92  --   CREATININE 3.32* 3.46* 3.62*  --   --  3.68*    Estimated Creatinine Clearance: 29.6 mL/min (A) (by C-G formula based on SCr of 3.68 mg/dL (H)).  Assessment: 60yof on coumadin pta for afib admitted with volume overload and acute renal failure. INR 2.84 on admission and coumadin resumed. INR 2.92 today but will hold in case she needs catheter placement for dialysis for volume removal.  Goal of Therapy:  INR 2-3 Monitor platelets by anticoagulation protocol: Yes   Plan:  1) Hold coumadin 2) Daily INR 3) Follow renal function +/- plans for dialysis  Paige Marshall 12/28/2016,1:27 PM

## 2016-12-28 NOTE — Progress Notes (Signed)
Advanced Heart Failure Rounding Note  PCP: EDWARDS, Milford Cage, NP  Primary Cardiologist: Dr. Gwenlyn Found   Subjective:    Admitted with volume overload, RV failure. Milrinone started 3/19. Co ox improved from 56.6->69.2 this am. Urine output remains sluggish. Weight up 3 pounds from yesterday, remains SOB at rest.   Morse SOB this am. Desats on talking. CVP 20. On lasix 60 q6 and metolazone 5 daily.   Remains in AF with RVR, now with rates in the 120-130's. Amiodarone gtt started yesterday.   She feels weak, fatigued. Still SOB, tearful this morning. + orthopnea. + edema. Refused CPAP last night.   Objective:   Weight Range: (!) 413 lb (187.3 kg) Body mass index is 63.73 kg/m.   Vital Signs:   Temp:  [97.5 F (36.4 C)-98.3 F (36.8 C)] 97.6 F (36.4 C) (03/20 0755) Pulse Rate:  [25-139] 134 (03/20 0755) Resp:  [14-25] 16 (03/20 0755) BP: (84-135)/(53-100) 110/77 (03/20 0755) SpO2:  [88 %-100 %] 93 % (03/20 0755) Weight:  [410 lb (186 kg)-413 lb (187.3 kg)] 413 lb (187.3 kg) (03/20 0352) Last BM Date: 12/27/16  Weight change: Filed Weights   12/27/16 0457 12/27/16 1519 12/28/16 0352  Weight: (!) 411 lb (186.4 kg) (!) 410 lb (186 kg) (!) 413 lb (187.3 kg)    Intake/Output:   Intake/Output Summary (Last 24 hours) at 12/28/16 0757 Last data filed at 12/28/16 0700  Gross per 24 hour  Intake          1597.21 ml  Output              650 ml  Net           947.21 ml     Physical Exam: General: Morbidly obese female. Lying in bed. Dyspneic with talking  HEENT: normal Neck: supple. JVP hard to see. Looks elevated . Carotids 2+ bilat; no bruits. No lymphadenopathy or thyromegaly appreciated. Cor: PMI nonpalpable Rapid, irregularly irregular. Distant. No obvious murmur. R subclav TLC Lungs: Diminished throughout.  No wheeze Abdomen: Morbidly obese, soft, nontender, + distended. Unable to assess for hepatosplenomegaly due to size. No bruits or masses. Good bowel  sounds. Extremities: no cyanosis, clubbing, rash. + 2-3 edema to thighs.  Neuro: alert & orientedx3, cranial nerves grossly intact. moves all 4 extremities w/o difficulty. Affect pleasant but stressed   Telemetry: Afib RVR with rates in the 120-130's. Personally reviewed   Labs: CBC  Recent Labs  12/26/16 1502  12/27/16 0810 12/28/16 0500  WBC 11.1*  < > 10.1 9.3  NEUTROABS 8.1*  --   --   --   HGB 9.5*  < > 9.2* 7.9*  HCT 34.6*  < > 33.0* 28.1*  MCV 90.8  < > 93.0 90.4  PLT 304  < > 189 207  < > = values in this interval not displayed. Basic Metabolic Panel  Recent Labs  12/27/16 0810 12/27/16 1232  NA 144 145  K 5.8* 5.8*  CL 101 100*  CO2 27 31  GLUCOSE 90 80  BUN 62* 64*  CREATININE 3.46* 3.62*  CALCIUM 9.1 9.3    BNP: BNP (last 3 results)  Recent Labs  09/18/16 1126 11/07/16 1633 12/26/16 1643  BNP 991.0* 490.4* 1,116.0*   Imaging/Studies:  Dg Chest Port 1 View  Result Date: 12/27/2016 CLINICAL DATA:  Post central line placement. EXAM: PORTABLE CHEST 1 VIEW COMPARISON:  12/26/2016 FINDINGS: Right-sided central line terminates at the low SVC. Midline trachea. Cardiomegaly accentuated by AP  portable technique. Atherosclerosis in the transverse aorta. Mildly degraded exam due to AP portable technique and patient body habitus. No pneumothorax. Suspect mild pulmonary venous congestion. Left lung base and pleural space not well evaluated. No right-sided pleural fluid or consolidation. IMPRESSION: Right-sided central line terminating at the low SVC ; no pneumothorax. Cardiomegaly with suggestion of mild pulmonary venous congestion. Aortic atherosclerosis. Electronically Signed   By: Abigail Miyamoto M.D.   On: 12/27/2016 16:39       Medications:     Scheduled Medications: . diltiazem  240 mg Oral Daily  . ferrous sulfate  325 mg Oral TID WC  . furosemide  160 mg Intravenous Q6H  . mouth rinse  15 mL Mouth Rinse BID  . metolazone  5 mg Oral Daily  .  oxybutynin  5 mg Oral BID  . senna-docusate  2 tablet Oral QHS  . sodium chloride flush  3 mL Intravenous Q12H  . Warfarin - Pharmacist Dosing Inpatient   Does not apply q1800     Infusions: . amiodarone 30 mg/hr (12/28/16 0700)  . milrinone 0.25 mcg/kg/min (12/28/16 0700)     PRN Medications:  sodium chloride, acetaminophen, ondansetron (ZOFRAN) IV, oxyCODONE, polyethylene glycol, promethazine, sodium chloride flush, zolpidem   Assessment/Plan  1. Acute on chronic diastolic CHF 2. Chronic atrial fibrillation on Coumadin 3. Acute on chronic kidney disease stage IV 4. Obesity hypoventilation syndrome.  5. Hyperkalemia 6. Acute RV failure/cor pulmonale 7. OSA, noncompliant with CPAP 8. Acute on chronic respiratory failure  She remains volume overloaded in the setting of RV failure. Milrinone 0.25mg  started yesterday, co ox improved from 56 to 69 this am.  743ml of urine out overnight, weight is up 3 pounds from yesterday. CVP 20.  More SOB this am, requiring more O2. Start lasix gtt at 30mg /hr, increase metolazone to 10mg  BID.   Now with rapid Afib, on amio gtt. Will give an additional 150mg  bolus this am. Will need DC-CV prior, MD to advise on timing.   BMET still pending this am, 3 samples have been sent and all 3 samples have hemolyzed. istat done at bedside - Creatinine 3.5, K 4.6, Na 143, CO2 35, Cl 96.   Hbg 9.2->7.9 this am. Will get fecal occult and iron studies. INR is 2.9.    Length of Stay: [place], NP  12/28/2016, 7:57 AM  Arbutus Leas, NP-C Advanced Heart Failure Team  Pager [phone] M-F 7am-4pm.  Please contact [facility] for night-coverage after hours (4p -7a ) and weekends on [email or web address]  Agree with above.   She remains very tenuous. Creatinine slightly better on labs today. Potassium improved. She is making more urine now on high dose lasix but respiratory status worsening. If she continues to accumulate fluid may need to be intubated.  Which would be difficult given her size and pulmonary HTN.   I discussed with Dr. Moshe Cipro. No definitive indication for HD at this point but may need volume removal soon.Unfortunately INR 2.9. Given need for DC-CV soon would prefer not to reverse INR. Will hold coumadin today. Switch to high dose lasix gtt at 30. Increase metolazone to 10 bid. Follow closely. Discussed with RN at bedside.   AF remains fast and is likely contributing to HF. Continue IV amio. Will need DC-CV when HF more stable.   Hgb drifting down. Get type and screen. Transfuse as needed.   Continues to refuse CPAP. May need Bipap soon.   The patient is critically ill  with multiple organ systems failure and requires high complexity decision making for assessment and support, frequent evaluation and titration of therapies, application of advanced monitoring technologies and extensive interpretation of multiple databases.   Critical Care Time devoted to patient care services described in this note is 45 Minutes. This time reflects time of care of this signee, Dr. Pierre Bali who personally examined the patient and determined the plan of care. This critical care time does not reflect procedure time, teaching time or supervisory time of our PA/NP/resident but could involve care discussion and coordination time.  Brocha Gilliam,MD 9:49 AM

## 2016-12-28 NOTE — Progress Notes (Signed)
Subjective: Today Paige Marshall reports improved SOB, but continues to have a cough productive of fluid for which she is using a suction device. She reports that her pain with urination from yesterday is resolved after taking the muscle relaxer. She reports that her abdominal pain has resolved, but she is concerned that she is still so fluid overloaded. She reports appropriate fear and anxiety about her situation, but has an overall positive philosophy. She states that she "just wants [place] to make her better."   Interval events: Transferred to SDU w/ central line for milrinone and co-ox  Objective: Vital signs in last 24 hours: Vitals:   12/28/16 0700 12/28/16 0755 12/28/16 1140 12/28/16 1703  BP: 111/67 110/77 (!) 102/57 131/63  Pulse: (!) 132 (!) 134 86 (!) 125  Resp: _0 Temp:  97.6 F (36.4 C) 97.5 F (36.4 C) 97.9 F (36.6 C)  TempSrc:  Oral Oral Oral  SpO2: 94% 93% 97% (!) 88%  Weight:      Height:       Filed Weights   12/27/16 0457 12/27/16 1519 12/28/16 0352  Weight: (!) 186.4 kg (411 lb) (!) 186 kg (410 lb) (!) 187.3 kg (413 lb)   Weight change: 1.134 kg (2 lb 8 oz)  Intake/Output Summary (Last 24 hours) at 12/28/16 1713 Last data filed at 12/28/16 1459  Gross per 24 hour  Intake          3829.72 ml  Output             1225 ml  Net          2604.72 ml   Exam:  General: morbidly obese, no acute distress HEENT: oral pharynx clear, mucus membranes moist, normal hearing Cardiovascular: No appreciable JVD due to body habitus. irregularly irregular rhythm, no murmur, distal pulses intact Pulmonary: mildly increased work of breathing (wearing O2 [place]), distant breaths sounds, no wheezes, bibasilar crackles, no tenderness to palpation Abdominal: Soft, non tender. Bowel sounds present. No hepatosplenomegaly. Extremities: normal range of motion, bilateral 3+ pitting edema Neurological: alert and oriented x3, normal strength Skin: skin is warm and dry, anasarca  Psych:  alert and oriented x3, appropriately anxious given her condition   Lab Results: K+ = 4.5 (despite low urine volume, moving in the right direction with kayaxelate) HGB = 7.9 (still trending down, likely due to multiple blood draws for labs or decreased epo production in CKD). CO2 = 34 (elevated bicarb indicates metabolic alkalosis) BUN = 65 (this is up from 54 yesterday, her renal function continues to deteriorate) Creatinine = 3.68 (her renal function continues to deteriorate, she will likely require temporary dialysis) EGFR = 15 PT = 31.7 (this is elevated as expected for warfarin use) INR = 2.99 (this is at the upper end of the therapeutic range for her, so we will continue her warfarin)   Micro Results: Recent Results (from the past 240 hour(s))  MRSA PCR Screening     Status: None   Collection Time: 12/27/16  3:22 PM  Result Value Ref Range Status   MRSA by PCR NEGATIVE NEGATIVE Final    Comment:        The GeneXpert MRSA Assay (FDA approved for NASAL specimens only), is one component of a comprehensive MRSA colonization surveillance program. It is not intended to diagnose MRSA infection nor to guide or monitor treatment for MRSA infections.    Studies/Results: US Renal  Result Date: 12/26/2016 CLINICAL DATA:  Acute on chronic renal failure  EXAM: RENAL / URINARY TRACT ULTRASOUND COMPLETE COMPARISON:  None. FINDINGS: Right Kidney: Length: 12.1 cm. Echogenicity within normal limits. No mass or hydronephrosis visualized. Left Kidney: Length: 11.1 cm. Echogenicity within normal limits. No mass or hydronephrosis visualized. Bladder: The bladder is nondistended. The patient voided immediately before the examination. Other: Moderate volume of lower abdominal ascites. IMPRESSION: 1. Normal appearance of the kidneys. 2. Moderate lower abdominal ascites. Electronically Signed   By: Ulyses Jarred M.D.   On: 12/26/2016 21:53   Dg Chest Port 1 View  Result Date: 12/27/2016 CLINICAL DATA:   Post central line placement. EXAM: PORTABLE CHEST 1 VIEW COMPARISON:  12/26/2016 FINDINGS: Right-sided central line terminates at the low SVC. Midline trachea. Cardiomegaly accentuated by AP portable technique. Atherosclerosis in the transverse aorta. Mildly degraded exam due to AP portable technique and patient body habitus. No pneumothorax. Suspect mild pulmonary venous congestion. Left lung base and pleural space not well evaluated. No right-sided pleural fluid or consolidation. IMPRESSION: Right-sided central line terminating at the low SVC ; no pneumothorax. Cardiomegaly with suggestion of mild pulmonary venous congestion. Aortic atherosclerosis. Electronically Signed   By: Abigail Miyamoto M.D.   On: 12/27/2016 16:39   Medications:  Scheduled Meds: . darbepoetin (ARANESP) injection - NON-DIALYSIS  150 mcg Subcutaneous Q Tue-1800  . ferrous sulfate  325 mg Oral TID WC  . mouth rinse  15 mL Mouth Rinse BID  . metolazone  10 mg Oral BID  . oxybutynin  5 mg Oral BID  . phytonadione (VITAMIN K) IV  5 mg Intravenous Once  . senna-docusate  2 tablet Oral QHS  . sodium chloride flush  3 mL Intravenous Q12H   Continuous Infusions: . amiodarone 30 mg/hr (12/28/16 1508)  . furosemide (LASIX) infusion 30 mg/hr (12/28/16 1030)  . milrinone 0.25 mcg/kg/min (12/28/16 1630)   PRN Meds:.sodium chloride, acetaminophen, ondansetron (ZOFRAN) IV, oxyCODONE, polyethylene glycol, promethazine, sodium chloride flush, zolpidem Assessment/Plan: Principal Problem:   Acute on chronic combined systolic and diastolic CHF (congestive heart failure) (HCC) Active Problems:   Essential hypertension   PAF (paroxysmal atrial fibrillation) (HCC)   Obstructive sleep apnea   Morbid obesity ([place])   Obesity hypoventilation syndrome (HCC)   COPD mixed type (HCC)   Chronic respiratory failure with hypoxia (HCC)   Acute kidney injury superimposed on CKD ([place])   Hyperkalemia   Thoracic aortic atherosclerosis ([place])    Cardiorenal syndrome  Paige Marshall is a 61 year old woman with a history of chronic combined systolic/diastolic CHF (EF 03%), COPD, CKD stage III, paroxysmal atrial fibrillation, HTN, morbid obesity, pulmonary hypertension, and OSA who presented today with shortness of breath, bilateral leg swelling, orthopnea, andtightness in her abdomen.   1) Acute on chronic combined systolic and diastolic CHF: Pt was admitted complaining of SOB, bilateral leg swelling, orthopnea, and tightness in her abdomen due to fluid overload. Today her exam showed 3+ peripheral edema, anasarca. Her abdominal tenderness and SOB feel better but she is still grossly fluid overloaded and requiring 3L O2. Heart failure is now on board and she has been moved to step down and had a central line placed for co-ox. She is now on IV laisx 131m TID, metolazone, and milrinone for diuresis. Today she made 1.2L of urine which is a step in the right direction, but she is up 3 pounds from yesterday and if her kidney function does not pick up with the powerful diuretic agents she will require dialysis soon. Her goal for diuresis is 1.5L per day.  Her co-ox improved from 56 to 69 this amThis slow steady process will helpfully prevent kidney injury while allowing net fluid loss. After oxybutynin dose her pain with urination (likely bladder spasms) is greatly improved.  We will continue her carvedilol for rate control of her a-fib. We will carefully track her urine output with foley catheter, strict ins and outs, and daily weights.   -continue telemetry -increase IV lasix ggt at 58m/hr -increase metolazone 161mBID -add milrinone 0.25 mg -track urine output with foley catheter, strict ins and outs, and daily weights -fluid restriction -continue carvidelol 6.25 BID -continue diltiazem xr 180 QD  2) Acute on chronic kidney disease (hyperkalemia): Ms. GaSanto+ was 6.3 on arrival in the ED, but her EKG does not show high peaked T waves or other  changes of hyperkalemia. (hyperkalemia can cause arrhythmia by altering cardiomyocyte action potential). Her K+ came down from 5.8 to 4.6 this AM [facility] appears to be about 1.8-2.0 (stage III/IV CKD). Today she is experiencing cardiorenal syndrome. Her Cr was 3.69 today indicating continued decrease in renal function. We will diurese her for her CHF which will help to reduce K+. Renal ultrasound ruled out obstruction. We will also need to watch her BUN and creatinine carefully as we begin to diurese her to ensure she does not get acute tubular necrosis due to hypoperfusion if we decrease her BP to treat the heart failure. Her urine output overnight was about 70071mUrine output is increasing and kidney function iststable but she is still volume overloaded. Nephrology says there are no indications for supportive dialysis at this time, but her volume could become problematic and if her urine output does not begin to respond to diuretics she will reiqure supportive dialysis in next 24 hours.  We will monitor her K+ with BMP BID. -Kayaxelate  -trend BMP BID -treat volume overload as above w/ diuresis   3) Paroxysmal atrial fibrillation: Pt reports a constant state of a-fib rate, and noticeable palpitations without carvedilol rate control BID. She is in A-fib on exam. Her EKG demonstrates a widened QRS interval. Her INR is now 2.99 and we will continue her warfarin as she is at the upper end of therapeutic range. Heart failure recommends she be placed on amiodarone gtt for rate control with an additional 150m73mlus this AM. They state that they will need DC-CV to correct her rhythm and cardiology should advise on timing. She takes diltiazem xr 180mg3mand carvedilol 6.25 BID. We will continue her carvedilol to control her rate, and add diltiazem for BP control per cardiology recommendation. She is rhythm controlled with class IC antiarrythmic propafenone, we will discontinue  this per cardiology recommendation. -carvidelol 6.25 BID -diltiazem XR 180mg 45mamiodarone gtt + 150mg b4m this am -continue warfarin  -trend INR -consult cardiology for DC-CV   4) COPD: Unclear severity, she had PFTs ordered by Dr. Young oAnnamaria Boots3/2017 but they never occurred. We will try to return the pt to her baseline and order PFTs when she is an outpatient, because her lung disease is not well characterized.There were no wheezes or signs of acute exacerbation on exam. CHF exacerbation is a better explanation of her SOB. She is on 3L of O2 nasal canula here and at home. She does not take disease modifying agents, only albuterol.  -continue O2 3L [place]  5) Anemia: Her hemoglobin has fallen from 9.2 to 7.9 and she has a mild normocytic anemia. She has no  signs/symptoms of acute blood loss (denies hematuria, melena, hematochezia), she appears well perfused (warm). She may be experiencing a hemolytic process due to her CKD or potentially decreased red cell production due to loss of appropriate epo production and bone marrow stimulation. This could also be due to continued blood draws. We will monitor and give Fe supplementation.  -continue Fe supplementation   6) HTN - soft on presentation, but overall improved on home meds. Home meds:carvedilol, diltiazem, lasix, metolazone. We will continue her carvedilol and lasix, which should provide appropriate HTN control. -carvidelol 6.25 BID -IV lasix ggt 71m/hr -amiodarone gtt +1555mbolus this am   7) obstructive sleep apnea: Ms. GaMasleyas diagnosed obstructive sleep apnea, exacerbated by obesity hypoventilation syndrome. She does not wear a cPAP at home which exacerbates her HTN and in turn her CHF due to metabolic acidosis and CO2 buildup. She needs to be on BiPAP every night, but she states she is intolerent of the mask. We will offer her BiPAP during this admission. -BiPAP   DVT PPx- low molecular weight heparin Code Status-  discussed with PT, Full code Consulted teams: Heart Failure, Nephrology  This is a [employer] information officerote.  The care of the patient was discussed with Dr. StGay Fillernd the assessment and plan formulated with their assistance.  Please see their attached note for official documentation of the daily encounter.   LOS: 2 days  Discharge: Pt requires intensive management by nephrology and cardiology and will remain in the hospital pending resolution of CHF exacerbation and renal failure.  JoAlta CorningMedical Student 12/28/2016, 5:13 PM

## 2016-12-28 NOTE — NC FL2 (Signed)
[place] LEVEL OF CARE SCREENING TOOL     IDENTIFICATION  Patient Name: Paige Marshall Birthdate: 1956/07/10 Sex: female Admission Date (Current Location): 12/26/2016  [facility] and [place] Number:  Herbalist and Address:  [facility] [place], [place] [address] [place], [place] [address]      Provider Number: [id number]  Attending Physician Name and Address:  Truman Hayward, MD  Relative Name and Phone Number:       Current Level of Care: Hospital Recommended Level of Care: [place] Prior Approval Number:    Date Approved/Denied:   PASRR Number: [id number]  Discharge Plan: SNF    Current Diagnoses: Patient Active Problem List   Diagnosis Date Noted  . Hyperkalemia 12/27/2016  . Thoracic aortic atherosclerosis ([place]) 12/27/2016  . Acute on chronic respiratory failure ([place]) 11/07/2016  . Acute on chronic congestive heart failure ([place]) 11/07/2016  . Acute renal failure superimposed on chronic kidney disease ([place])   . Acute respiratory failure with hypoxemia ([place]) 09/18/2016  . Orthostatic hypotension   . Bradycardia   . Acute on chronic combined systolic and diastolic CHF (congestive heart failure) ([place])   . Anemia 09/06/2016  . Left fibular fracture 09/06/2016  . SOB (shortness of breath)   . Acute kidney injury superimposed on CKD ([place]) 08/31/2016  . NICM (nonischemic cardiomyopathy) ([place]) 08/31/2016  . Pulmonary hypertension 08/31/2016  . Right heart failure, NYHA class 3 08/31/2016  . Acute on chronic diastolic CHF (congestive heart failure) ([place]) 08/29/2016  . 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 Syncope   . AKI (acute kidney injury) ([place]) 03/08/2016  . Acute on chronic renal failure ([place]) 03/08/2016  . Chronic respiratory failure with hypoxia ([place]) 10/24/2015  . COPD mixed type ([place]) 10/10/2015  . 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 Uncontrolled hypertension   . Obesity hypoventilation syndrome ([place]) 01/14/2015  . Obstructive sleep apnea 09/28/2013  . Morbid obesity ([place]) 09/28/2013  . PAF (paroxysmal atrial fibrillation) ([place]) 01/09/2010  . Dyslipidemia 10/17/2006  . Essential hypertension 10/17/2006  . VENTRICULAR HYPERTROPHY, LEFT 10/17/2006  . DYSFUNCTIONAL UTERINE BLEEDING 10/17/2006    Orientation RESPIRATION BLADDER Height & Weight     Self, Time, Situation, Place  O2 (Nasal cannula 3L) Indwelling catheter, Incontinent Weight: (!) 187.3 kg (413 lb) Height:  5' 7.5" (171.5 cm)  BEHAVIORAL SYMPTOMS/MOOD NEUROLOGICAL BOWEL NUTRITION STATUS      Continent Diet (Please see DC Summary)  AMBULATORY STATUS COMMUNICATION OF NEEDS Skin   Limited Assist Verbally Normal                       Personal Care Assistance Level of Assistance  Bathing, Feeding, Dressing Bathing Assistance: Limited assistance Feeding assistance: Independent Dressing Assistance: Limited assistance     Functional Limitations Info             SPECIAL CARE FACTORS FREQUENCY  PT (By licensed PT)     PT Frequency: not yet assessed              Contractures      Additional Factors Info  Code Status, Allergies Code Status Info: Full Allergies Info: Penicillins, Citrus           Current Medications (12/28/2016):  This is the current hospital active medication list Current Facility-Administered Medications  Medication Dose Route Frequency  Provider Last Rate Last Dose  . 0.9 %  sodium chloride infusion  250 mL Intravenous PRN Collier Salina, MD 10 mL/hr at 12/28/16 0700 250 mL at 12/28/16 0700  . acetaminophen (TYLENOL) tablet 650 mg  650 mg Oral Q4H PRN Collier Salina, MD      . amiodarone (NEXTERONE PREMIX) 360-4.14 MG/200ML-% (1.8 mg/mL) IV infusion  30 mg/hr Intravenous Continuous Arbutus Leas, NP 16.7 mL/hr at 12/28/16 0700 30 mg/hr at 12/28/16 0700  .  Darbepoetin Alfa (ARANESP) injection 150 mcg  150 mcg Subcutaneous Q Tue-1800 Corliss Parish, MD      . ferrous sulfate tablet 325 mg  325 mg Oral TID WC Collier Salina, MD   325 mg at 12/28/16 1152  . furosemide (LASIX) 250 mg in dextrose 5 % 250 mL (1 mg/mL) infusion  30 mg/hr Intravenous Continuous Jolaine Artist, MD 30 mL/hr at 12/28/16 1030 30 mg/hr at 12/28/16 1030  . MEDLINE mouth rinse  15 mL Mouth Rinse BID Lucious Groves, DO   15 mL at 12/28/16 1000  . metolazone (ZAROXOLYN) tablet 10 mg  10 mg Oral BID Jolaine Artist, MD      . milrinone (PRIMACOR) 20 MG/100 ML (0.2 mg/mL) infusion  0.25 mcg/kg/min Intravenous Continuous Arbutus Leas, NP 14 mL/hr at 12/28/16 1151 0.25 mcg/kg/min at 12/28/16 1151  . ondansetron (ZOFRAN) injection 4 mg  4 mg Intravenous Q6H PRN Collier Salina, MD      . oxybutynin (DITROPAN) tablet 5 mg  5 mg Oral BID Collier Salina, MD   5 mg at 12/28/16 0824  . oxyCODONE (Oxy IR/ROXICODONE) immediate release tablet 5 mg  5 mg Oral Q6H PRN Collier Salina, MD      . polyethylene glycol (MIRALAX / GLYCOLAX) packet 17 g  17 g Oral Daily PRN Collier Salina, MD   17 g at 12/27/16 0258  . promethazine (PHENERGAN) tablet 25 mg  25 mg Oral Q6H PRN Collier Salina, MD      . senna-docusate (Senokot-S) tablet 2 tablet  2 tablet Oral QHS Collier Salina, MD      . sodium chloride flush (NS) 0.9 % injection 3 mL  3 mL Intravenous Q12H Collier Salina, MD   3 mL at 12/28/16 1000  . sodium chloride flush (NS) 0.9 % injection 3 mL  3 mL Intravenous PRN Collier Salina, MD      . zolpidem (AMBIEN) tablet 5 mg  5 mg Oral QHS PRN Collier Salina, MD         Discharge Medications: Please see discharge summary for a list of discharge medications.  Relevant Imaging Results:  Relevant Lab Results:   Additional Information SS#: [id number]  Benard Halsted, LCSWA

## 2016-12-28 NOTE — Progress Notes (Signed)
CSW received consult regarding PT recommendation of SNF at discharge. Patient came to hospital from [facility] and reported that she was to be discharged this week since she used all her Medicare days.   Patient stated she would like to return home with home health, her daughter, and a friend instead of going back to SNF. RNCM alerted.  CSW signing off.   Percell Locus Jrake Rodriquez LCSWA [phone]

## 2016-12-28 NOTE — Progress Notes (Addendum)
Subjective: Currently, the patient feels okay w/o worsening of SOB. No significant Abd pain. Still fluid overloaded. Anxious about plan and prognosis.  Interval Events: Transferred to SDU w/ central line for milrinone and co-ox. Amio ggt. Creatinine stable. Potassium down to 4.5. UOP increased slightly. Hgb stable low.  Objective: Vitals:   12/28/16 0352 12/28/16 0700 12/28/16 0755 12/28/16 1140  BP: 124/68 111/67 110/77 (!) 102/57  Pulse: (!) 112 (!) 132 (!) 134 86  Resp: 18 17 16 20   Temp: 98.3 F (36.8 C)  97.6 F (36.4 C) 97.5 F (36.4 C)  TempSrc: Oral  Oral Oral  SpO2: 93% 94% 93% 97%  Weight: (!) 413 lb (187.3 kg)     Height:       24-hour weight change: Filed Weights   12/27/16 0457 12/27/16 1519 12/28/16 0352  Weight: (!) 411 lb (186.4 kg) (!) 410 lb (186 kg) (!) 413 lb (187.3 kg)   Dry Wt: 370 lbs  Intake/Output Summary (Last 24 hours) at 12/28/16 1201 Last data filed at 12/28/16 1146  Gross per 24 hour  Intake          3435.62 ml  Output              650 ml  Net          2785.62 ml   Physical Exam: Physical Exam  Constitutional: She is oriented to person, place, and time. She appears well-developed. She is cooperative. No distress.  Morbidly obese female, lying in bed w/o acute distress.  Neck: No JVD (JVD could not be appreciated d/t body habitus) present.  Cardiovascular: Normal rate, regular rhythm, normal heart sounds and normal pulses.  Exam reveals no gallop.   No murmur heard. Pulmonary/Chest: No respiratory distress. She has no wheezes. She has rales (bibasilar). Breasts are symmetrical.  Wearing O2 w/ mild increase in WOB.  Abdominal: Soft. Bowel sounds are normal. There is no tenderness.  Musculoskeletal: She exhibits edema (pitting to shoulders, 3+ in BLE).  Neurological: She is alert and oriented to person, place, and time.  Skin: Skin is warm.   Labs:  Recent Labs Lab 12/27/16 0158 12/27/16 0810 12/28/16 0500  HGB 9.6* 9.2* 7.9*    HCT 35.1* 33.0* 28.1*  WBC 11.4* 10.1 9.3  PLT 213 189 207    Recent Labs Lab 12/26/16 1502 12/26/16 2124 12/27/16 0158 12/27/16 0810 12/27/16 1232  NA 143 142 142 144 145  K 6.3* 6.2* 6.5* 5.8* 5.8*  CL 101 100* 100* 101 100*  CO2 28 31 29 27 31   GLUCOSE 100* 108* 95 90 80  BUN 61* 62* 62* 62* 64*  CREATININE 3.03* 3.22* 3.32* 3.46* 3.62*  CALCIUM 9.3 9.1 9.4 9.1 9.3    Assessment/Plan: [name] is a 61 y.o. female with COPD, CKD, AF, CHF who presents with hypervolemia and CHF exacerbation.  1) Combined CHF Exacerbation: Dyspnea stable, but with mild increased WOB. On [place] O2. Still grossly volume overloaded. Continue to appreciate cardiology assistance with milrinone. Appreciate Nephrology assistance, will follow K and resp status for concern of emergent HD. - milrinone per HF team - lasix 30/hr ggt - metolazone 10mg  BID - follow UOP closely, daily wts - BMP BID  2) Acute oliguric renal failure on CKD III/IV: #Hyperkalemia: SCr stable at 3.5 on iStat from b/l around 1.8-2.0. K improved to 4.5 w/ mild UOP and Lasix. UOP still minimal but improved on high dose lasix/metolazone + milrinone. - continue diuresis per HF as  above - Nephrology c/s, appreciate recs - continue Kayaxelate PRN for hyperK - trend BMP BID  3) PAF: In RVR w/ HR 120s-130s. AC w/ coumadin 5mg  qD, but INR supertherapeutic at 4.12 on admission, now 2.9. Currently with rate control of dilt XR 180mg  qD and carvedilol 6.25 BID. On amio ggt per Cards. May need DC-CV to improve rates and EF. - continue rate control w/ Dilt/Coreg - continue amio ggt - Coumadin per Pharm - trend INR - Cardiology c/s, appreciate recs  4) COPD: Unclear severity, no PFTs on record. On home O2 3L [place], reports that she only uses at night, now requiring 24h. No sx of acute exacerbation presently.  5) Anemia: Hgb 8.1. May be contributing to SOB. Normocytic. No signs/symptoms of acute BL. Fe low 11/09/16.  Significant CKD, likely epo failure. Continue to monitor and give Fe supplementation. Transfuse as needed for Hgb >7.0.  6) HTN: BP normotensive, continue diuretics and rate control as above.  7) OHS / OSA: Pt has OSA and OHS but doe snot wear CPAP or BiPap regularly d/t intolerance of the mask. This is likely exacerbating her symptoms and we will offer her BiPap during this admission qHS. May require more resp support if diuresis fails.  Length of Stay: 2 days Dispo: Anticipated discharge pending resolution on CHF exacerbation and renal failure.  Holley Raring, MD Pager: [phone] (7AM-5PM) 12/27/2016, 10:53 AM    Date: 12/28/2016  Patient name: Paige Marshall  Medical record number: [id number]  Date of birth: September 25, 1956   This patient's plan of care was discussed with the house staff. Please see their note for complete details. I concur with their findings.  Greatly appreciate Cardiology Heart Failure team, Dr. Haroldine Laws, Nephrology, Dr. Shelva Majestic help.  I suspect patient is going to need HD if not in next 24 hours then fairly soon to help handle her excess volume and to prevent intubation. Hopefully she will make more headway today. DC-CV planned as well with AF contributing to her CHF.   Truman Hayward, MD 12/28/2016, 3:07 PM

## 2016-12-29 ENCOUNTER — Inpatient Hospital Stay (HOSPITAL_COMMUNITY): Payer: Medicare Other

## 2016-12-29 ENCOUNTER — Ambulatory Visit: Payer: Medicare Other | Admitting: Physician Assistant

## 2016-12-29 DIAGNOSIS — I480 Paroxysmal atrial fibrillation: Secondary | ICD-10-CM

## 2016-12-29 DIAGNOSIS — N189 Chronic kidney disease, unspecified: Secondary | ICD-10-CM

## 2016-12-29 DIAGNOSIS — N179 Acute kidney failure, unspecified: Secondary | ICD-10-CM

## 2016-12-29 LAB — BASIC METABOLIC PANEL
Anion gap: 10 (ref 5–15)
BUN: 63 mg/dL — AB (ref 6–20)
CALCIUM: 8.8 mg/dL — AB (ref 8.9–10.3)
CO2: 35 mmol/L — ABNORMAL HIGH (ref 22–32)
Chloride: 98 mmol/L — ABNORMAL LOW (ref 101–111)
Creatinine, Ser: 3.72 mg/dL — ABNORMAL HIGH (ref 0.44–1.00)
GFR calc Af Amer: 14 mL/min — ABNORMAL LOW (ref >60)
GFR, EST NON AFRICAN AMERICAN: 12 mL/min — AB (ref >60)
Glucose, Bld: 103 mg/dL — ABNORMAL HIGH (ref 65–99)
POTASSIUM: 4.1 mmol/L (ref 3.5–5.1)
Sodium: 143 mmol/L (ref 135–145)

## 2016-12-29 LAB — RENAL FUNCTION PANEL
ALBUMIN: 3 g/dL — AB (ref 3.5–5.0)
ANION GAP: 10 (ref 5–15)
BUN: 61 mg/dL — ABNORMAL HIGH (ref 6–20)
CALCIUM: 8.7 mg/dL — AB (ref 8.9–10.3)
CO2: 33 mmol/L — ABNORMAL HIGH (ref 22–32)
Chloride: 98 mmol/L — ABNORMAL LOW (ref 101–111)
Creatinine, Ser: 3.54 mg/dL — ABNORMAL HIGH (ref 0.44–1.00)
GFR, EST AFRICAN AMERICAN: 15 mL/min — AB (ref >60)
GFR, EST NON AFRICAN AMERICAN: 13 mL/min — AB (ref >60)
GLUCOSE: 119 mg/dL — AB (ref 65–99)
POTASSIUM: 3.8 mmol/L (ref 3.5–5.1)
Phosphorus: 3.7 mg/dL (ref 2.5–4.6)
SODIUM: 141 mmol/L (ref 135–145)

## 2016-12-29 LAB — CBC
HCT: 27.1 % — ABNORMAL LOW (ref 36.0–46.0)
Hemoglobin: 7.6 g/dL — ABNORMAL LOW (ref 12.0–15.0)
MCH: 25.3 pg — AB (ref 26.0–34.0)
MCHC: 28 g/dL — ABNORMAL LOW (ref 30.0–36.0)
MCV: 90.3 fL (ref 78.0–100.0)
PLATELETS: 145 10*3/uL — AB (ref 150–400)
RBC: 3 MIL/uL — ABNORMAL LOW (ref 3.87–5.11)
RDW: 25.5 % — AB (ref 11.5–15.5)
WBC: 7.4 10*3/uL (ref 4.0–10.5)

## 2016-12-29 LAB — RETICULOCYTES
RBC.: 3 MIL/uL — ABNORMAL LOW (ref 3.87–5.11)
Retic Count, Absolute: 114 10*3/uL (ref 19.0–186.0)
Retic Ct Pct: 3.8 % — ABNORMAL HIGH (ref 0.4–3.1)

## 2016-12-29 LAB — COOXEMETRY PANEL
Carboxyhemoglobin: 2.6 % — ABNORMAL HIGH (ref 0.5–1.5)
Carboxyhemoglobin: 2.9 % — ABNORMAL HIGH (ref 0.5–1.5)
METHEMOGLOBIN: 1 % (ref 0.0–1.5)
Methemoglobin: 1.1 % (ref 0.0–1.5)
O2 Saturation: 35.1 %
O2 Saturation: 51.8 %
TOTAL HEMOGLOBIN: 10.2 g/dL — AB (ref 12.0–16.0)
Total hemoglobin: 7.6 g/dL — ABNORMAL LOW (ref 12.0–16.0)

## 2016-12-29 LAB — IRON AND TIBC
IRON: 64 ug/dL (ref 28–170)
Saturation Ratios: 23 % (ref 10.4–31.8)
TIBC: 274 ug/dL (ref 250–450)
UIBC: 210 ug/dL

## 2016-12-29 LAB — VITAMIN B12: Vitamin B-12: 1357 pg/mL — ABNORMAL HIGH (ref 180–914)

## 2016-12-29 LAB — FERRITIN: FERRITIN: 115 ng/mL (ref 11–307)

## 2016-12-29 LAB — PROTIME-INR
INR: 1.92
Prothrombin Time: 22.2 seconds — ABNORMAL HIGH (ref 11.4–15.2)

## 2016-12-29 LAB — FOLATE: FOLATE: 19.3 ng/mL (ref >5.9)

## 2016-12-29 LAB — HEPARIN LEVEL (UNFRACTIONATED): Heparin Unfractionated: 0.5 IU/mL (ref 0.30–0.70)

## 2016-12-29 MED ORDER — HEPARIN SODIUM (PORCINE) 1000 UNIT/ML DIALYSIS
1000.0000 [IU] | INTRAMUSCULAR | Status: DC | PRN
  Administered 2016-12-29: 3000 [IU] via INTRAVENOUS_CENTRAL
  Filled 2016-12-29: qty 6
  Filled 2016-12-29: qty 3
  Filled 2016-12-29: qty 4
  Filled 2016-12-29: qty 6

## 2016-12-29 MED ORDER — ALPRAZOLAM 0.25 MG PO TABS
0.2500 mg | ORAL_TABLET | Freq: Four times a day (QID) | ORAL | Status: DC | PRN
Start: 2016-12-29 — End: 2017-01-20
  Administered 2016-12-29 – 2017-01-18 (×16): 0.25 mg via ORAL
  Filled 2016-12-29 (×16): qty 1

## 2016-12-29 MED ORDER — SODIUM CHLORIDE 0.9 % FOR CRRT
INTRAVENOUS_CENTRAL | Status: DC | PRN
  Filled 2016-12-29: qty 1000

## 2016-12-29 MED ORDER — SODIUM CHLORIDE 0.9% FLUSH: 10.0000 mL | INTRAVENOUS | Status: DC | PRN

## 2016-12-29 MED ORDER — HEPARIN (PORCINE) IN NACL 100-0.45 UNIT/ML-% IJ SOLN
2650.0000 [IU]/h | INTRAMUSCULAR | Status: DC
  Administered 2016-12-29 – 2017-01-03 (×11): 2350 [IU]/h via INTRAVENOUS
  Administered 2017-01-04: 2500 [IU]/h via INTRAVENOUS
  Administered 2017-01-04: 2350 [IU]/h via INTRAVENOUS
  Administered 2017-01-05: 2650 [IU]/h via INTRAVENOUS
  Filled 2016-12-29 (×17): qty 250

## 2016-12-29 MED ORDER — AMIODARONE IV BOLUS ONLY 150 MG/100ML
150.0000 mg | Freq: Once | INTRAVENOUS | Status: AC
  Administered 2016-12-29: 150 mg via INTRAVENOUS

## 2016-12-29 MED ORDER — SODIUM CHLORIDE 0.9 % IV SOLN
510.0000 mg | INTRAVENOUS | Status: DC
  Administered 2016-12-29: 510 mg via INTRAVENOUS
  Filled 2016-12-29 (×3): qty 17

## 2016-12-29 MED ORDER — PRISMASOL BGK 4/2.5 32-4-2.5 MEQ/L IV SOLN
INTRAVENOUS | Status: DC
  Administered 2016-12-29 – 2017-01-05 (×11): via INTRAVENOUS_CENTRAL
  Filled 2016-12-29 (×12): qty 5000

## 2016-12-29 MED ORDER — PRISMASOL BGK 4/2.5 32-4-2.5 MEQ/L IV SOLN
INTRAVENOUS | Status: DC
  Administered 2016-12-29 – 2017-01-06 (×52): via INTRAVENOUS_CENTRAL
  Filled 2016-12-29 (×62): qty 5000

## 2016-12-29 MED ORDER — PRISMASOL BGK 4/2.5 32-4-2.5 MEQ/L IV SOLN
INTRAVENOUS | Status: DC
  Administered 2016-12-29 – 2017-01-05 (×10): via INTRAVENOUS_CENTRAL
  Filled 2016-12-29 (×12): qty 5000

## 2016-12-29 MED ORDER — SODIUM CHLORIDE 0.9% FLUSH
10.0000 mL | Freq: Two times a day (BID) | INTRAVENOUS | Status: DC
  Administered 2016-12-29: 20 mL
  Administered 2016-12-30 – 2017-01-07 (×13): 10 mL
  Administered 2017-01-08: 20 mL

## 2016-12-29 MED ORDER — CHLORHEXIDINE GLUCONATE CLOTH 2 % EX PADS
6.0000 | MEDICATED_PAD | Freq: Every day | CUTANEOUS | Status: DC
  Administered 2016-12-30 – 2017-02-02 (×34): 6 via TOPICAL

## 2016-12-29 NOTE — Progress Notes (Signed)
Subjective:  Made 1800 of urine !!  But had 3700 in...... Not sure we can decrease ins that much or increase outs- respiratory status remains tenuous Objective Vital signs in last 24 hours: Vitals:   12/29/16 0329 12/29/16 0400 12/29/16 0411 12/29/16 0430  BP: 114/90 (!) 123/92    Pulse: (!) 122 (!) 121  (!) 128  Resp: 15 18  19   Temp:  98.3 F (36.8 C)    TempSrc:  Oral    SpO2: 94% 92%  (!) 86%  Weight:   (!) 187.8 kg (414 lb)   Height:       Weight change: 1.814 kg (4 lb)  Intake/Output Summary (Last 24 hours) at 12/29/16 4166 Last data filed at 12/29/16 0600  Gross per 24 hour  Intake          3760.04 ml  Output             1875 ml  Net          1885.04 ml    Assessment/Plan: 61 year old black female with morbid obesity and Marshall multitude of pulmonary issues who presents with recurrent congestive heart failure and acute on chronic renal failure 1.Renal- acute on chronic renal failure (recent creatinine on 2/3 was 1.97)- most likely due to cardiorenal syndrome/hemodynamics as urinalysis is fairly bland and renal ultrasound is normal. Appreciate heart failure team initiating milrinone in an intensive care unit setting. UOP is increasing and kidney function pretty stable but still very overloaded and more in than out. There are no absolute indications for dialysis at this time, however I do not see volume becoming Marshall lot better without intervention.  I would say we need CRRT but will also discuss with CHF team- will need move to ICU part of 4N and HD access 2. Hypertension/volume  - volume overloaded - UOP better but not able to achieve diuresis 3. Anemia  - due to frequent blood draws hospitalization as well as CKD. iron stores low- will give feraheme- and have added ESA.  Probably is not helping her SOB 4. Hyperkalemia-  seems better- BID blood draws    Paige Marshall    Labs: Basic Metabolic Panel:  Recent Labs Lab 12/28/16 1200 12/28/16 1706 12/29/16 0444  NA 143  142 143  K 4.5 4.4 4.1  CL 100* 98* 98*  CO2 34* 33* 35*  GLUCOSE 142* 121* 103*  BUN 65* 62* 63*  CREATININE 3.68* 3.70* 3.72*  CALCIUM 8.9 8.8* 8.8*   Liver Function Tests: No results for input(s): AST, ALT, ALKPHOS, BILITOT, PROT, ALBUMIN in the last 168 hours. No results for input(s): LIPASE, AMYLASE in the last 168 hours. No results for input(s): AMMONIA in the last 168 hours. CBC:  Recent Labs Lab 12/26/16 1502 12/27/16 0158 12/27/16 0810 12/28/16 0500 12/29/16 0444  WBC 11.1* 11.4* 10.1 9.3 7.4  NEUTROABS 8.1*  --   --   --   --   HGB 9.5* 9.6* 9.2* 7.9* 7.6*  HCT 34.6* 35.1* 33.0* 28.1* 27.1*  MCV 90.8 93.1 93.0 90.4 90.3  PLT 304 213 189 207 145*   Cardiac Enzymes: No results for input(s): CKTOTAL, CKMB, CKMBINDEX, TROPONINI in the last 168 hours. CBG:  Recent Labs Lab 12/26/16 1645  GLUCAP 88    Iron Studies:   Recent Labs  12/29/16 0444  IRON 64  TIBC 274  FERRITIN 115   Studies/Results: Dg Chest Port 1 View  Result Date: 12/27/2016 CLINICAL DATA:  Post central line placement. EXAM: PORTABLE  CHEST 1 VIEW COMPARISON:  12/26/2016 FINDINGS: Right-sided central line terminates at the low SVC. Midline trachea. Cardiomegaly accentuated by AP portable technique. Atherosclerosis in the transverse aorta. Mildly degraded exam due to AP portable technique and patient body habitus. No pneumothorax. Suspect mild pulmonary venous congestion. Left lung base and pleural space not well evaluated. No right-sided pleural fluid or consolidation. IMPRESSION: Right-sided central line terminating at the low SVC ; no pneumothorax. Cardiomegaly with suggestion of mild pulmonary venous congestion. Aortic atherosclerosis. Electronically Signed   By: Abigail Miyamoto M.D.   On: 12/27/2016 16:39   Medications: Infusions: . amiodarone 30 mg/hr (12/29/16 0330)  . furosemide (LASIX) infusion 30 mg/hr (12/29/16 0230)  . milrinone 0.25 mcg/kg/min (12/28/16 2308)    Scheduled  Medications: . darbepoetin (ARANESP) injection - NON-DIALYSIS  150 mcg Subcutaneous Q Tue-1800  . ferrous sulfate  325 mg Oral TID WC  . mouth rinse  15 mL Mouth Rinse BID  . metolazone  10 mg Oral BID  . oxybutynin  5 mg Oral BID  . senna-docusate  2 tablet Oral QHS  . sodium chloride flush  3 mL Intravenous Q12H    have reviewed scheduled and prn medications.  Physical Exam: General: alert- understands situation and is ready to use dialysis if needed  Heart:tachy Lungs: CBS bilat Abdomen: soft,non tender Extremities: pitting edema Dialysis Access: none yet     12/29/2016,7:27 AM  LOS: 3 days

## 2016-12-29 NOTE — Progress Notes (Signed)
Advanced Heart Failure Rounding Note  PCP: EDWARDS, Milford Cage, NP  Primary Cardiologist: Dr. Gwenlyn Found   Subjective:    Admitted with volume overload, RV failure. Milrinone 0.60mcg started 3/19. Co ox from 56.6->69.2->51.8 this am. Urine output overnight 1300, but remains positive. Weight up 1 pound from yesterday, remains SOB at rest. Creatinine 3.22->3.32->3.46->3.62->3.68->3.72. CVP 21-22.   Started on Lasix gtt yesterday at 30mg /hr, metolazone increased to 10mg  BID.  Decent urine output but unable to keep up with IV intake. Weight and CVP climbing.   Remains in atrial fibrillation with rapid rates at time. On Amio gtt. At 30mg /hr.   Feels SOB this am. Placed on Bipap overnight for respiratory distress,   Objective:   Weight Range: (!) 414 lb (187.8 kg) Body mass index is 63.88 kg/m.   Vital Signs:   Temp:  [97.5 F (36.4 C)-99.6 F (37.6 C)] 98.3 F (36.8 C) (03/21 0400) Pulse Rate:  [86-142] 128 (03/21 0430) Resp:  [15-29] 19 (03/21 0430) BP: (102-144)/(52-94) 123/92 (03/21 0400) SpO2:  [82 %-100 %] 86 % (03/21 0430) FiO2 (%):  [50 %] 50 % (03/21 0400) Weight:  [414 lb (187.8 kg)] 414 lb (187.8 kg) (03/21 0411) Last BM Date: 12/27/16  Weight change: Filed Weights   12/27/16 1519 12/28/16 0352 12/29/16 0411  Weight: (!) 410 lb (186 kg) (!) 413 lb (187.3 kg) (!) 414 lb (187.8 kg)    Intake/Output:   Intake/Output Summary (Last 24 hours) at 12/29/16 0747 Last data filed at 12/29/16 0600  Gross per 24 hour  Intake          3760.04 ml  Output             1875 ml  Net          1885.04 ml     Physical Exam: General: Obese female, lying bed. +dynspneic/tachypneic HEENT: normal, atraumatic  Neck: supple. Hard to assess JVP due to size, appears elevated. Carotids 2+ bilat; no bruits. No lymphadenopathy or thyromegaly appreciated. Cor: PMI nonpalpable. Tachycardic, irregularly irregular. Distant. [place] TLC Lungs: Diminished throughout, on 6L. +crackles. No  wheeze Abdomen: Morbidly obese, soft, nontender. +distended Unable to assess for hepatosplenomegaly due to size. No bruits or masses. Good bowel sounds. Extremities: no cyanosis, clubbing, rash. 2-3+ pedal and pretibial edema into thighs. Warm Neuro: alert & orientedx3, cranial nerves grossly intact. moves all 4 extremities w/o difficulty. Affect pleasant but concerned   Telemetry: Afib RVR with rates in the 120s. Personally reviewed    Labs: CBC  Recent Labs  12/26/16 1502  12/28/16 0500 12/29/16 0444  WBC 11.1*  < > 9.3 7.4  NEUTROABS 8.1*  --   --   --   HGB 9.5*  < > 7.9* 7.6*  HCT 34.6*  < > 28.1* 27.1*  MCV 90.8  < > 90.4 90.3  PLT 304  < > 207 145*  < > = values in this interval not displayed. Basic Metabolic Panel  Recent Labs  12/28/16 1200 12/28/16 1706 12/29/16 0444  NA 143 142 143  K 4.5 4.4 4.1  CL 100* 98* 98*  CO2 34* 33* 35*  GLUCOSE 142* 121* 103*  BUN 65* 62* 63*  CREATININE 3.68* 3.70* 3.72*  CALCIUM 8.9 8.8* 8.8*  MG 2.1  --   --     BNP: BNP (last 3 results)  Recent Labs  09/18/16 1126 11/07/16 1633 12/26/16 1643  BNP 991.0* 490.4* 1,116.0*    Medications:     Scheduled Medications: .  darbepoetin (ARANESP) injection - NON-DIALYSIS  150 mcg Subcutaneous Q Tue-1800  . ferrous sulfate  325 mg Oral TID WC  . ferumoxytol  510 mg Intravenous Weekly  . mouth rinse  15 mL Mouth Rinse BID  . metolazone  10 mg Oral BID  . oxybutynin  5 mg Oral BID  . senna-docusate  2 tablet Oral QHS  . sodium chloride flush  3 mL Intravenous Q12H    Infusions: . amiodarone 30 mg/hr (12/29/16 0330)  . furosemide (LASIX) infusion 30 mg/hr (12/29/16 0230)  . milrinone 0.25 mcg/kg/min (12/28/16 2308)    PRN Medications: sodium chloride, acetaminophen, ondansetron (ZOFRAN) IV, oxyCODONE, polyethylene glycol, promethazine, sodium chloride, sodium chloride flush, zolpidem   Assessment/Plan  1. Acute on chronic diastolic CHF 2. Paroxysmal atrial  fibrillation on Coumadin 3. Acute on chronic kidney disease stage IV 4. Obesity hypoventilation syndrome.  5. Hyperkalemia 6. Acute RV failure/cor pulmonale 7. OSA, noncompliant with CPAP 8. Acute on chronic respiratory failure  She remains volume overloaded in the setting of RV failure. Milrinone 0.25mg  started 12/27/16, co ox 56.6->69.2->51.8 . 1330ml of urine out overnight, weight is up 1 pound from yesterday. CVP 21-22  . Lasix gtt started yesterday and metolazone increased to 10mg  BID. She did make more urine overnight, remains volume overloaded. Renal saw this am, likely needs CRRT. Creatinine 3.22->3.32->3.46->3.62->3.68->3.72.  Now with rapid Afib, on amio gtt. Will give an additional 150mg  bolus this am.   Hbg 9.2->7.9->7.6 this am. Iron studies not remarkable. No BM yet so no fecal occult done.     Length of Stay: [place], NP  12/29/2016, 7:47 AM  Arbutus Leas, NP-C Advanced Heart Failure Team  Pager [phone] M-F 7am-4pm.  Please contact [facility] for night-coverage after hours (4p -7a ) and weekends on [email or web address]   Agree with above.  Ms. Sachdeva continues to deteriorate. Was on BiPAP last night for progressive respiratory failure. Volume status continues to go up despite high-dose lasix and milrinone support. I have d/w Nephrology and Primary team. Will move to ICU and place trialysis catheter for CVVHD. Will try to avoid intubation .  Remains in rapid AF. Hopefully will improve with volume removal. Will need DC-CV prior to d/c. Continue amio. Start heparin with coumadin off.   Creatinine seems to have plateaued. Renal following.   Hgb continues to drop. Will likely need transfusion once volume status improved.   The patient is critically ill with multiple organ systems failure and requires high complexity decision making for assessment and support, frequent evaluation and titration of therapies, application of advanced monitoring technologies and  extensive interpretation of multiple databases.   Critical Care Time devoted to patient care services described in this note is 45 Minutes. This time reflects time of care of this signee, Dr. Pierre Bali who personally examined the patient and determined the plan of care. This critical care time does not reflect procedure time, teaching time or supervisory time of our PA/NP/resident but could involve care discussion and coordination time.  Glori Bickers, MD

## 2016-12-29 NOTE — Progress Notes (Signed)
Pt's CoOx 35% this am. Dr. Koleen Nimrod called, orders received to repeat value. RN will continue to monitor.

## 2016-12-29 NOTE — Progress Notes (Signed)
ANTICOAGULATION CONSULT NOTE - Follow Up Consult  Pharmacy Consult for Heparin Indication: atrial fibrillation  Allergies  Allergen Reactions  . Penicillins Hives, Itching, Swelling and Other (See Comments)    Tongue swelling Has patient had a PCN reaction causing immediate rash, facial/tongue/throat swelling, SOB or lightheadedness with hypotension: Yes  Clarified with the patient her penicillin allergy. Pt has tolerated cefepime in the past. Pt also states that she can take amoxicillin.    . Citrus Hives    Patient Measurements: Height: 5' 7.5" (171.5 cm) Weight: (!) 414 lb (187.8 kg) IBW/kg (Calculated) : 62.75 Heparin Dosing Weight: 111kg  Vital Signs: Temp: 97.7 F (36.5 C) (03/21 0750) Temp Source: Oral (03/21 0750) BP: 123/92 (03/21 0400) Pulse Rate: 134 (03/21 0750)  Labs:  Recent Labs  12/27/16 0810  12/28/16 0500 12/28/16 0615 12/28/16 1200 12/28/16 1706 12/29/16 0444  HGB 9.2*  --  7.9*  --   --   --  7.6*  HCT 33.0*  --  28.1*  --   --   --  27.1*  PLT 189  --  207  --   --   --  145*  LABPROT 30.4*  --   --  31.2* 31.7*  --  22.2*  INR 2.84  --   --  2.92 2.99  --  1.92  CREATININE 3.46*  < >  --   --  3.68* 3.70* 3.72*  < > = values in this interval not displayed.  Estimated Creatinine Clearance: 29.4 mL/min (A) (by C-G formula based on SCr of 3.72 mg/dL (H)).  Assessment: 60yof on coumadin pta for afib, admitted with volume overload and renal failure. INR 2.84 on admit and coumadin initially resumed (received one dose of 5mg ). Yesterday INR 2.9, coumadin held, and she received 5mg  vitamin k to reverse INR anticipating need for CRRT. Today she will begin CRRT. INR down to 1.9 and heparin bridge to begin after trialysis catheter placement.   Previously therapeutic on 2350 units/hr back in December.   Goal of Therapy:  Heparin level 0.3-0.7 units/ml Monitor platelets by anticoagulation protocol: Yes   Plan:  1) After trialysis catheter placed,  begin heparin at 2350 units/hr with no bolus 2) Check 8 hour heparin level 3) Daily heparin level and CBC  Deboraha Sprang 12/29/2016,10:13 AM

## 2016-12-29 NOTE — Progress Notes (Signed)
Subjective: Currently, the patient feels ok. Has increased anxiety, but WOB is still stable. UOP improved, but still inadequate. Will transfer to ICU for CRRT.  Interval Events: Mild increase in hypoxia ON.  Objective: Vitals:   12/29/16 0430 12/29/16 0750 12/29/16 0900 12/29/16 1000  BP:      Pulse: (!) 128 (!) 134    Resp: 19 18 (!) 22 (!) 23  Temp:  97.7 F (36.5 C)    TempSrc:  Oral    SpO2: (!) 86% 92% (!) 89% 92%  Weight:      Height:       24-hour weight change: Filed Weights   12/27/16 1519 12/28/16 0352 12/29/16 0411  Weight: (!) 410 lb (186 kg) (!) 413 lb (187.3 kg) (!) 414 lb (187.8 kg)   Dry Wt: 370 lbs  Intake/Output Summary (Last 24 hours) at 12/29/16 1123 Last data filed at 12/29/16 0800  Gross per 24 hour  Intake          2485.63 ml  Output             1875 ml  Net           610.63 ml   Physical Exam: Physical Exam  Constitutional: She is oriented to person, place, and time. She appears well-developed. She is cooperative. No distress.  Morbidly obese female, lying in bed w/o acute distress.  Neck: No JVD (JVD could not be appreciated d/t body habitus) present.  Cardiovascular: Normal rate, regular rhythm, normal heart sounds and normal pulses.  Exam reveals no gallop.   No murmur heard. Pulmonary/Chest: No respiratory distress. She has no wheezes. She has rales (bibasilar). Breasts are symmetrical.  Wearing O2 w/ mild increase in WOB.  Abdominal: Soft. Bowel sounds are normal. There is no tenderness.  Musculoskeletal: She exhibits edema (pitting to shoulders, 3+ in BLE).  Neurological: She is alert and oriented to person, place, and time.  Skin: Skin is warm.   Labs:  Recent Labs Lab 12/27/16 0810 12/28/16 0500 12/29/16 0444  HGB 9.2* 7.9* 7.6*  HCT 33.0* 28.1* 27.1*  WBC 10.1 9.3 7.4  PLT 189 207 145*    Recent Labs Lab 12/27/16 0810 12/27/16 1232 12/28/16 1200 12/28/16 1706 12/29/16 0444  NA 144 145 143 142 143  K 5.8* 5.8* 4.5  4.4 4.1  CL 101 100* 100* 98* 98*  CO2 27 31 34* 33* 35*  GLUCOSE 90 80 142* 121* 103*  BUN 62* 64* 65* 62* 63*  CREATININE 3.46* 3.62* 3.68* 3.70* 3.72*  CALCIUM 9.1 9.3 8.9 8.8* 8.8*  MG  --   --  2.1  --   --    Assessment/Plan: [name] is a 61 y.o. female with COPD, CKD, AF, CHF who presents with hypervolemia and CHF exacerbation.  1) Combined CHF Exacerbation: Dyspnea stable, but w/ more hypoxia ON. Still on [place] O2. UOP inadequate, will need CRRT. Transfer to ICU. We will resume care when volume stabilized. - milrinone per HF team - lasix 30/hr ggt - metolazone 10mg  BID - CRRT in ICU  2) Acute oliguric renal failure on CKD III/IV: #Hyperkalemia: SCr stable at 3.7 b/l around 1.8-2.0. K stable at 4.1 w/ some improved UOP 1843mL, unfortunately high volumes of IV input for ionotrop/lasix/amio leave net positive 1800. Appreciate Dr. Donato Heinz assistance, recommends CRRT in ICU. - CRRT  3) PAF: In RVR w/ HR 120s-130s. AC w/ coumadin 5mg  qD, but INR supertherapeutic at 4.12 on admission, now 1.9. Currently  with rate control of dilt XR 180mg  qD and carvedilol 6.25 BID. On amio ggt per Cards. May need DC-CV to improve rates and EF. - continue rate control w/ Dilt/Coreg - continue amio ggt, s/p extra 150 bolus - Coumadin per Pharm - trend INR - Cardiology c/s, appreciate recs  4) COPD: Unclear severity, no PFTs on record. On home O2 3L [place], reports that she only uses at night, now requiring 24h. No sx of acute exacerbation presently.  5) Anemia: Hgb 7.6. May be contributing to SOB. Normocytic. No signs/symptoms of acute BL. Fe low, replete w/ ferriheme. Significant CKD, started on aranesp.   6) HTN: BP normotensive, continue diuretics and rate control as above.  7) OHS / OSA: This is likely exacerbating her symptoms at night and we have continued to offer her BiPap, which she has declined. She did wear this for 4 hours last night.  Length of Stay: 3  days Dispo: Transfer to ICU for CRRT. Will resume care when transferred out of ICU.  Holley Raring, MD Pager: [phone] (7AM-5PM) 12/27/2016, 10:53 AM

## 2016-12-29 NOTE — Care Management Note (Signed)
Case Management Note Original note created by Olga Coaster 12/27/16  Patient Details  Name: ANBER MCKIVER MRN: [id number] Date of Birth: October 09, 1956  Subjective/Objective:        Admitted with CHF           Action/Plan: Patient from SNF; to return to SNF when medically stable; Soc Worker following case for disposition needs when patent is medically stable. Noted 5 admissions in 6 months.  Expected Discharge Date:      Undetermined at this time            Expected Discharge Plan:  [place]  Discharge planning Services  CM Consult  Status of Service:  In process, will continue to follow  Karlyne Greenspan [phone] 12/29/2016, 8:50 AM Pt assessed by CSW - pt states she wants to go home.  CM placed PT evaluation with note that specified pt from SNF but wants to go home.  CM contacted daughter per CSW.

## 2016-12-29 NOTE — Progress Notes (Signed)
ANTICOAGULATION CONSULT NOTE - Follow Up Consult  Pharmacy Consult for Heparin Indication: atrial fibrillation  Allergies  Allergen Reactions  . Penicillins Hives, Itching, Swelling and Other (See Comments)    Tongue swelling Has patient had a PCN reaction causing immediate rash, facial/tongue/throat swelling, SOB or lightheadedness with hypotension: Yes  Clarified with the patient her penicillin allergy. Pt has tolerated cefepime in the past. Pt also states that she can take amoxicillin.    . Citrus Hives    Patient Measurements: Height: 5' 7.5" (171.5 cm) Weight: (!) 414 lb (187.8 kg) IBW/kg (Calculated) : 62.75 Heparin Dosing Weight: 111kg  Vital Signs: Temp: 97.9 F (36.6 C) (03/21 1900) Temp Source: Oral (03/21 1900) BP: 123/47 (03/21 2300) Pulse Rate: 136 (03/21 2300)  Labs:  Recent Labs  12/27/16 0810  12/28/16 0500 12/28/16 0615 12/28/16 1200 12/28/16 1706 12/29/16 0444 12/29/16 1626 12/29/16 2305  HGB 9.2*  --  7.9*  --   --   --  7.6*  --   --   HCT 33.0*  --  28.1*  --   --   --  27.1*  --   --   PLT 189  --  207  --   --   --  145*  --   --   LABPROT 30.4*  --   --  31.2* 31.7*  --  22.2*  --   --   INR 2.84  --   --  2.92 2.99  --  1.92  --   --   HEPARINUNFRC  --   --   --   --   --   --   --   --  0.50  CREATININE 3.46*  < >  --   --  3.68* 3.70* 3.72* 3.54*  --   < > = values in this interval not displayed.  Estimated Creatinine Clearance: 30.8 mL/min (A) (by C-G formula based on SCr of 3.54 mg/dL (H)).  Assessment: 60yof on coumadin pta for afib, admitted with volume overload and renal failure. INR 2.84 on admit and coumadin initially resumed (received one dose of 5mg ). Yesterday INR 2.9, coumadin held, and she received 5mg  vitamin k to reverse INR anticipating need for CRRT -begun 3/21. INR down to 1.92 and heparin bridge started.  Heparin level therapeutic (0.5) on gtt at 2350 units/hr. No bleeding noted but Hgb 7.6 (low but stable)  -receiving IV iron.  Goal of Therapy:  Heparin level 0.3-0.7 units/ml Monitor platelets by anticoagulation protocol: Yes   Plan:  Continue heparin at 2350 units/hr  Daily heparin level and CBC  Sherlon Handing, PharmD, BCPS Clinical pharmacist, pager [phone] 12/29/2016,11:52 PM

## 2016-12-29 NOTE — Progress Notes (Signed)
Pt is on NIV at this time tolerating it well. No distress noted.

## 2016-12-29 NOTE — Progress Notes (Signed)
Pt refusing to wear BiPap and Non-Rebreather mask. Pt currently on 6L [place], SPo2 86-90%. RN will continue to monitor.

## 2016-12-29 NOTE — Procedures (Signed)
Hemodialysis Catheter Insertion Procedure Note Paige Marshall [id number] 1956-06-19  Procedure: Insertion of Hemodialysis Catheter Indications: Hemodialysis  Procedure Details Consent: Risks of procedure as well as the alternatives and risks of each were explained to the (patient/caregiver).  Consent for procedure obtained.  Time Out: Verified patient identification, verified procedure, site/side was marked, verified correct patient position, special equipment/implants available, medications/allergies/relevent history reviewed, required imaging and test results available.  Performed  Maximum sterile technique was used including antiseptics, cap, gloves, gown, hand hygiene, mask and sheet.  Skin prep: Chlorhexidine; local anesthetic administered  A Trialysis HD catheter was placed in the left internal jugular vein to 20 cm using the Seldinger technique.  Wire visualized in vessel prior to cannulation.   Evaluation Blood flow good Complications: No apparent complications Patient did tolerate procedure well. Chest X-ray ordered to verify placement.  CXR: pending.   Procedure performed under direct supervision of Dr. Nelda Marseille and with ultrasound guidance for real time vessel cannulation.     Paige Gens, Paige Marshall [facility] Pgr: [phone] or [phone] 12/29/2016, 2:10 PM  Rush Farmer, M.D. [facility]monary/Critical Care Medicine. Pager: [phone]. After hours pager: [phone].

## 2016-12-29 NOTE — Progress Notes (Signed)
Subjective: Today Ms. Crossno reports slightly worsened SOB and continued cough productive of fluid for which she is using a suction device. She reports feeling even more fluid overloaded pointing to her swollen abdomen and hands. She reports a return of her abdominal tightness. She denies any pain with urination or bladder spasm. She is appropriately anxious about her condition, and hopeful that she will feel better after dialysis. Her nurse reports that she used her BiPAP for 4 hours last night after a "come to jesus" talk, but she is still resistant to it. Her nurse additionally reports that she was prescribed xanex this morning that helped with her nerves a little.   Objective: Vital signs in last 24 hours: Vitals:   12/29/16 1400 12/29/16 1410 12/29/16 1420 12/29/16 1430  BP: 113/60 109/70 (!) 129/92 126/82  Pulse: (!) 143 (!) 33 (!) 137 (!) 146  Resp: (!) 22 19 19 19   Temp:      TempSrc:      SpO2: 95% 91% 95% 97%  Weight:      Height:       Filed Weights   12/27/16 1519 12/28/16 0352 12/29/16 0411  Weight: (!) 186 kg (410 lb) (!) 187.3 kg (413 lb) (!) 187.8 kg (414 lb)   Weight change: 1.814 kg (4 lb)  Intake/Output Summary (Last 24 hours) at 12/29/16 1442 Last data filed at 12/29/16 1400  Gross per 24 hour  Intake          2370.13 ml  Output             2575 ml  Net          -204.87 ml   General: morbidly obese, no acute distress HEENT: oral pharynx clear, mucus membranes moist, normal hearing Cardiovascular: No appreciable JVD due to body habitus.irregularly irregular rhythm, no murmur, distal pulses intact Pulmonary: increased work of breathing (wearing O2 [place]), distant breaths sounds, no wheezes, bibasilarcrackles, no tenderness to palpation Abdominal: Soft, non tender.Bowel sounds present. No hepatosplenomegaly. Extremities: normal range of motion, bilateral 3+ pitting edema Neurological: alert and oriented x3, normal strength Skin: skin is warm and dry, anasarca    Psych: alert and oriented x3, appropriately anxious given her condition, responds appropriately to questions  Lab Results: K+ = 4.1 (down from 4.5 yesterday. despite low urine volume, moving in the right direction with kayaxelate) HGB = 7.6 (still trending down, likely due to multiple blood draws for labs, hemolytic process given CKD or decreased epo production in CKD). CO2 = 35 (elevated bicarb indicates metabolic alkalosis) BUN = 63 (this roughly the same as yesterday, her renal function is stable but poor) Creatinine = 3.72 (her renal function is stable but poor, today she will go on supportive dialysis) INR = 1.92 (this is at the low end of the therapeutic range, so we will continue her warfarin)   Micro Results: Recent Results (from the past 240 hour(s))  MRSA PCR Screening     Status: None   Collection Time: 12/27/16  3:22 PM  Result Value Ref Range Status   MRSA by PCR NEGATIVE NEGATIVE Final    Comment:        The GeneXpert MRSA Assay (FDA approved for NASAL specimens only), is one component of a comprehensive MRSA colonization surveillance program. It is not intended to diagnose MRSA infection nor to guide or monitor treatment for MRSA infections.    Studies/Results: Dg Chest Port 1 View  Result Date: 12/29/2016 CLINICAL DATA:  Encounter for central line  placement. EXAM: PORTABLE CHEST 1 VIEW COMPARISON:  Radiograph of December 27, 2016. FINDINGS: Stable cardiomegaly. Bilateral perihilar interstitial densities are noted concerning for edema. Interval placement of left internal jugular dialysis catheter with distal tips in expected position of cavoatrial junction. Stable position of right subclavian catheter with distal tip in expected position of the SVC. No pneumothorax is noted. Possible mild bilateral pleural effusions are noted. Bony thorax is unremarkable. IMPRESSION: Stable cardiomegaly is noted with probable bilateral perihilar edema and mild pleural effusions. Aortic  atherosclerosis. Stable position of right subclavian catheter. Interval placement of left internal jugular dialysis catheter with distal tip in expected position of cavoatrial junction. Electronically Signed   By: Marijo Conception, M.D.   On: 12/29/2016 14:23   Dg Chest Port 1 View  Result Date: 12/27/2016 CLINICAL DATA:  Post central line placement. EXAM: PORTABLE CHEST 1 VIEW COMPARISON:  12/26/2016 FINDINGS: Right-sided central line terminates at the low SVC. Midline trachea. Cardiomegaly accentuated by AP portable technique. Atherosclerosis in the transverse aorta. Mildly degraded exam due to AP portable technique and patient body habitus. No pneumothorax. Suspect mild pulmonary venous congestion. Left lung base and pleural space not well evaluated. No right-sided pleural fluid or consolidation. IMPRESSION: Right-sided central line terminating at the low SVC ; no pneumothorax. Cardiomegaly with suggestion of mild pulmonary venous congestion. Aortic atherosclerosis. Electronically Signed   By: Abigail Miyamoto M.D.   On: 12/27/2016 16:39   Medications:  Scheduled Meds: . darbepoetin (ARANESP) injection - NON-DIALYSIS  150 mcg Subcutaneous Q Tue-1800  . ferrous sulfate  325 mg Oral TID WC  . ferumoxytol  510 mg Intravenous Weekly  . mouth rinse  15 mL Mouth Rinse BID  . oxybutynin  5 mg Oral BID  . senna-docusate  2 tablet Oral QHS  . sodium chloride flush  3 mL Intravenous Q12H   Continuous Infusions: . amiodarone 30 mg/hr (12/29/16 1200)  . heparin    . milrinone 0.25 mcg/kg/min (12/29/16 0800)  . dialysis replacement fluid (prismasate) 300 mL/hr at 12/29/16 1427  . dialysis replacement fluid (prismasate) 300 mL/hr at 12/29/16 1427  . dialysate (PRISMASATE) 1,500 mL/hr at 12/29/16 1427   PRN Meds:.sodium chloride, acetaminophen, ALPRAZolam, heparin, ondansetron (ZOFRAN) IV, oxyCODONE, polyethylene glycol, promethazine, sodium chloride, sodium chloride, sodium chloride flush,  zolpidem Assessment/Plan: Principal Problem:   Acute on chronic combined systolic and diastolic CHF (congestive heart failure) (HCC) Active Problems:   Essential hypertension   PAF (paroxysmal atrial fibrillation) (HCC)   Obstructive sleep apnea   Morbid obesity ([place])   Obesity hypoventilation syndrome (HCC)   COPD mixed type (HCC)   Chronic respiratory failure with hypoxia (HCC)   Acute kidney injury superimposed on CKD ([place])   Hyperkalemia   Thoracic aortic atherosclerosis ([place])   Cardiorenal syndrome   Paige Marshall is a 61 year old woman with a history of chronic combined systolic/diastolic CHF (EF 45%), COPD, CKD stage III, paroxysmal atrial fibrillation, HTN, morbid obesity, pulmonary hypertension, and OSA who presented today with shortness of breath, bilateral leg swelling, orthopnea, andtightness in her abdomen.   1) Acute on chronic combined systolic and diastolic CHF: Pt was admitted complaining of SOB, bilateral leg swelling, orthopnea, and tightness in her abdomen dueto fluid overload. Today her exam showed 3+ peripheral edema, anasarca. Her abdominal tenderness and SOB are worse today, but she is not in respiratory distress. She is still grossly fluid overloaded and requiring 3L O2. Her co-ox was recorded as low as 35% this morning. Although she has  produced 1.8 total liters of urine, her weight has still increased by 1 lb despite intense diuresis with ggt lasix, metolazone, and milrinone for diuresis. Nephrology has agreed that it is time for her to be transferred to the ICU for supportive CRRT dialysis. This slow steady ultrafiltrate process will helpfully prevent kidney injury while allowing net fluid loss. We will carefully track her urine output with foley catheter, strict ins and outs, and daily weights. Removing fluid is essential for controlling her cardiorenal syndrome and improving her CHF and kidney function. We will resume her care once her HF is stabilized. -CRRT in  ICU -continue telemetry -IV lasix ggt at 30mg /hr -Metolazone 10mg  BID -add milrinone 0.25 mg per HF team -track urine output with foley catheter, strict ins and outs, and daily weights -fluid restriction  2) Acute on chronic kidney disease (hyperkalemia): Ms. Markiewicz K+ was 6.3 on arrival in the ED, but her EKG does not show high peaked T waves or other changes of hyperkalemia. (hyperkalemia can cause arrhythmia by altering cardiomyocyte action potential). Her K+ came down from 5.6 to 4.1 this AM in response to [facility]. Her baseline sCR appears to be about 1.8-2.0 (stage III/IV CKD). She is experiencing cardiorenal syndrome. Her Cr was 3.71 today is similar to 3.69 yesterday and indicates stable but poor renal function. She has produced 1885ml of clear urine, but unfortunately input volume for lasix and milranone leave net intake at +1800. Nephrology advises transfer to ICU for CRRT.  We will also need to watch her BUN and creatinine carefully as we begin to diurese her to ensure she does not get acute tubular necrosis due to hypoperfusion if we decrease her BP to treat the heart failure. We will monitor her K+ with BMP BID. -Kayaxelate  -trend BMP BID -treat volume overload as above CRRT in ICU and continued diuresis  3) Paroxysmal atrial fibrillation: Pt reports a constant state of a-fib rate, and noticeable palpitations without carvedilol rate control BID. She is in A-fib on exam. Her EKG demonstrates a widened QRS interval. She is in RVR w/ HR 120s-130s. Her INR is now 1.92 and we will continue her warfarin as she is at the lower end of therapeutic range. She is on amiodarone gtt for rate control with an additional 150mg  bolus this AM. They state that they will need DC-CV to correct her rhythm and cardiology should advise on timing. She takes diltiazem xr 180mg  qD and carvedilol 6.25 BID. We will continue her carvedilol to control her rate, and adddiltiazem for BP control per cardiology  recommendation.  -carvidelol 6.25 BID -diltiazem XR 180mg  QD -amiodarone gtt + 150mg  bolus this am -continue warfarin  -trend INR -consult cardiology for DC-CV, appreciate recs  4) COPD: Unclear severity, she had PFTs ordered by Dr. Annamaria Boots on 10/24/2015 but they never occurred. We will try to return the pt to her baseline and order PFTs when she is an outpatient, because her lung disease is not well characterized.There were no wheezes or signs of acute exacerbation on exam. CHF exacerbation is a better explanation of her SOB. She is on 3L of O2 nasal canula here and at home. She does not take disease modifying agents, only albuterol.  -continue O2 3L [place]  5) Anemia: Her hemoglobin has fallen from 9.2 to 7.9 and she has a mild normocytic anemia. She has no signs/symptoms of acute blood loss (denies hematuria, melena, hematochezia), she appears well perfused (warm). She may be experiencing a hemolytic process due to her  CKD or potentially decreased red cell production due to loss of appropriate epo production and bone marrow stimulation. This could also be due to continued blood draws. We will monitor and give Fe supplementation.  -continue Fe supplementation  -started on aranesp due to significant CKD  6) HTN - soft on presentation, but overall improved on home meds. Home meds:carvedilol, diltiazem, lasix, metolazone. We will continue her carvedilol and lasix, which should provide appropriate HTN control.  -continue rate control and diuretics as above  7) obstructive sleep apnea: Ms. Bergsma has diagnosed obstructive sleep apnea, exacerbated by obesity hypoventilation syndrome. She does not wear a cPAP at home which exacerbates her HTN and in turn her CHF due to metabolic acidosis and CO2 buildup. She needs to be on BiPAP every night, but she states she is intolerent of the mask. We will offer her BiPAP during this admission. She is normally resistant, but did wear BiPAP for 4 hours last night.    -BiPAP   DVT PPx- low molecular weight heparin Code Status- discussed with PT, Full code Consulted teams: Heart Failure, Nephrology  This is a Careers information officer Note.  The care of the patient was discussed with Dr. Gay Filler and the assessment and plan formulated with their assistance.  Please see their attached note for official documentation of the daily encounter.  LOS: 3 days  Discharge: Pt requires intensive management by nephrology and cardiology and will remain in the hospital pending resolution of CHF exacerbation and renal failure.  Alta Corning, Medical Student 12/29/2016, 2:42 PM

## 2016-12-29 NOTE — Progress Notes (Signed)
This RN observed pt's HR sustaining in the 140-150 AF, BP stable. Forrest Bing, NP made aware. Orders given for 150mg  Amio bolus. No change observed, PRN xanax given for anxiety. Will continue to monitor pt closely.

## 2016-12-30 ENCOUNTER — Inpatient Hospital Stay (HOSPITAL_COMMUNITY): Payer: Medicare Other

## 2016-12-30 DIAGNOSIS — J9621 Acute and chronic respiratory failure with hypoxia: Secondary | ICD-10-CM

## 2016-12-30 DIAGNOSIS — I132 Hypertensive heart and chronic kidney disease with heart failure and with stage 5 chronic kidney disease, or end stage renal disease: Principal | ICD-10-CM

## 2016-12-30 DIAGNOSIS — J9601 Acute respiratory failure with hypoxia: Secondary | ICD-10-CM

## 2016-12-30 LAB — RENAL FUNCTION PANEL
ANION GAP: 13 (ref 5–15)
ANION GAP: 9 (ref 5–15)
Albumin: 2.8 g/dL — ABNORMAL LOW (ref 3.5–5.0)
Albumin: 2.9 g/dL — ABNORMAL LOW (ref 3.5–5.0)
BUN: 47 mg/dL — ABNORMAL HIGH (ref 6–20)
BUN: 39 mg/dL — AB (ref 6–20)
CALCIUM: 8.3 mg/dL — AB (ref 8.9–10.3)
CHLORIDE: 97 mmol/L — AB (ref 101–111)
CO2: 30 mmol/L (ref 22–32)
CO2: 31 mmol/L (ref 22–32)
Calcium: 8.5 mg/dL — ABNORMAL LOW (ref 8.9–10.3)
Chloride: 98 mmol/L — ABNORMAL LOW (ref 101–111)
Creatinine, Ser: 2.73 mg/dL — ABNORMAL HIGH (ref 0.44–1.00)
Creatinine, Ser: 2.37 mg/dL — ABNORMAL HIGH (ref 0.44–1.00)
GFR calc Af Amer: 25 mL/min — ABNORMAL LOW (ref >60)
GFR, EST AFRICAN AMERICAN: 21 mL/min — AB (ref >60)
GFR, EST NON AFRICAN AMERICAN: 18 mL/min — AB (ref >60)
GFR, EST NON AFRICAN AMERICAN: 21 mL/min — AB (ref >60)
Glucose, Bld: 98 mg/dL (ref 65–99)
Glucose, Bld: 117 mg/dL — ABNORMAL HIGH (ref 65–99)
POTASSIUM: 3.6 mmol/L (ref 3.5–5.1)
Phosphorus: 3 mg/dL (ref 2.5–4.6)
Phosphorus: 2.4 mg/dL — ABNORMAL LOW (ref 2.5–4.6)
Potassium: 3.5 mmol/L (ref 3.5–5.1)
SODIUM: 138 mmol/L (ref 135–145)
Sodium: 140 mmol/L (ref 135–145)

## 2016-12-30 LAB — POCT I-STAT 3, ART BLOOD GAS (G3+)
Acid-Base Excess: 6 mmol/L — ABNORMAL HIGH (ref 0.0–2.0)
Acid-Base Excess: 5 mmol/L — ABNORMAL HIGH (ref 0.0–2.0)
Bicarbonate: 31.7 mmol/L — ABNORMAL HIGH (ref 20.0–28.0)
Bicarbonate: 30.5 mmol/L — ABNORMAL HIGH (ref 20.0–28.0)
O2 Saturation: 59 %
O2 Saturation: 84 %
PCO2 ART: 47.8 mmHg (ref 32.0–48.0)
PO2 ART: 48 mmHg — AB (ref 83.0–108.0)
TCO2: 33 mmol/L (ref 0–100)
TCO2: 32 mmol/L (ref 0–100)
pCO2 arterial: 50.5 mmHg — ABNORMAL HIGH (ref 32.0–48.0)
pH, Arterial: 7.406 (ref 7.350–7.450)
pH, Arterial: 7.414 (ref 7.350–7.450)
pO2, Arterial: 31 mmHg — CL (ref 83.0–108.0)

## 2016-12-30 LAB — COOXEMETRY PANEL
Carboxyhemoglobin: 3.3 % — ABNORMAL HIGH (ref 0.5–1.5)
Methemoglobin: 1.6 % — ABNORMAL HIGH (ref 0.0–1.5)
O2 SAT: 55.8 %
Total hemoglobin: 7.4 g/dL — ABNORMAL LOW (ref 12.0–16.0)

## 2016-12-30 LAB — CBC
HEMATOCRIT: 26.2 % — AB (ref 36.0–46.0)
HEMOGLOBIN: 7.4 g/dL — AB (ref 12.0–15.0)
MCH: 25.5 pg — AB (ref 26.0–34.0)
MCHC: 28.2 g/dL — AB (ref 30.0–36.0)
MCV: 90.3 fL (ref 78.0–100.0)
PLATELETS: 134 10*3/uL — AB (ref 150–400)
RBC: 2.9 MIL/uL — ABNORMAL LOW (ref 3.87–5.11)
RDW: 26.4 % — AB (ref 11.5–15.5)
WBC: 6.8 10*3/uL (ref 4.0–10.5)

## 2016-12-30 LAB — POCT I-STAT, CHEM 8
BUN: 74 mg/dL — ABNORMAL HIGH (ref 6–20)
Calcium, Ion: 1.16 mmol/L (ref 1.15–1.40)
Chloride: 96 mmol/L — ABNORMAL LOW (ref 101–111)
Creatinine, Ser: 3.5 mg/dL — ABNORMAL HIGH (ref 0.44–1.00)
Glucose, Bld: 113 mg/dL — ABNORMAL HIGH (ref 65–99)
HCT: 26 % — ABNORMAL LOW (ref 36.0–46.0)
Hemoglobin: 8.8 g/dL — ABNORMAL LOW (ref 12.0–15.0)
Potassium: 4.6 mmol/L (ref 3.5–5.1)
Sodium: 143 mmol/L (ref 135–145)
TCO2: 35 mmol/L (ref 0–100)

## 2016-12-30 LAB — MAGNESIUM: Magnesium: 2 mg/dL (ref 1.7–2.4)

## 2016-12-30 LAB — PROTIME-INR
INR: 1.43
Prothrombin Time: 17.6 seconds — ABNORMAL HIGH (ref 11.4–15.2)

## 2016-12-30 LAB — HEPARIN LEVEL (UNFRACTIONATED): HEPARIN UNFRACTIONATED: 0.57 [IU]/mL (ref 0.30–0.70)

## 2016-12-30 MED ORDER — AMIODARONE LOAD VIA INFUSION
150.0000 mg | Freq: Once | INTRAVENOUS | Status: AC
  Administered 2016-12-30: 150 mg via INTRAVENOUS
  Filled 2016-12-30: qty 83.34

## 2016-12-30 MED ORDER — DILTIAZEM LOAD VIA INFUSION
5.0000 mg | Freq: Once | INTRAVENOUS | Status: AC
  Administered 2016-12-30: 5 mg via INTRAVENOUS
  Filled 2016-12-30: qty 5

## 2016-12-30 MED ORDER — DILTIAZEM HCL-DEXTROSE 100-5 MG/100ML-% IV SOLN (PREMIX)
5.0000 mg/h | INTRAVENOUS | Status: DC
  Administered 2016-12-30: 5 mg/h via INTRAVENOUS
  Administered 2016-12-31 – 2017-01-08 (×20): 10 mg/h via INTRAVENOUS
  Filled 2016-12-30 (×23): qty 100

## 2016-12-30 NOTE — Progress Notes (Signed)
Called NP Amy Clegg to verify cardizem order. Per NP Amy, RN is not to titrate the cardizem gtt. Will continue to monitor.

## 2016-12-30 NOTE — Progress Notes (Signed)
Advanced Heart Failure Rounding Note  PCP: EDWARDS, Milford Cage, NP  Primary Cardiologist: Dr. Gwenlyn Found   Subjective:    Admitted with volume overload, RV failure. Milrinone 0.41mcg started 3/19. Started CRRT 12/29/16.   Co ox  56.6->69.2->51.8-> this am. Urine output 1175 overnight -3.9L out with CRRT, weight down 14 pounds from yesterday.    Creatinine 3.22->3.32->3.46->3.62->3.68->3.72->3.54->2.73. CVP 19-20 .   Remains in AF with RVR. Rates in 130's. SOB at rest and using accessory muscles to breathe. Placed on BIPAP.   Objective:   Weight Range: (!) 400 lb (181.4 kg) Body mass index is 61.72 kg/m.   Vital Signs:   Temp:  [97.7 F (36.5 C)-98 F (36.7 C)] 97.7 F (36.5 C) (03/22 0400) Pulse Rate:  [33-155] 132 (03/22 0700) Resp:  [15-31] 18 (03/22 0700) BP: (90-175)/(47-144) 132/73 (03/22 0700) SpO2:  [85 %-99 %] 90 % (03/22 0700) FiO2 (%):  [45 %-50 %] 45 % (03/22 0400) Weight:  [400 lb (181.4 kg)] 400 lb (181.4 kg) (03/22 0400) Last BM Date: 12/29/16  Weight change: Filed Weights   12/28/16 0352 12/29/16 0411 12/30/16 0400  Weight: (!) 413 lb (187.3 kg) (!) 414 lb (187.8 kg) (!) 400 lb (181.4 kg)    Intake/Output:   Intake/Output Summary (Last 24 hours) at 12/30/16 0721 Last data filed at 12/30/16 0700  Gross per 24 hour  Intake          2147.46 ml  Output             5728 ml  Net         -3580.54 ml     Physical Exam: General: Obese female,.+dynspneic/tachypneic, anxious.  HEENT: normal, atraumatic anicteric Neck: supple. Hard to assess JVP due to size, appears elevated. Carotids 2+ bilat; no bruits. No lymphadenopathy or thyromegaly appreciated. Left IJ trialysis  Cor: PMI nonpalpable. Tachycardic, irregularly irregular. Distant. Lungs: Diminished throughout on Bipap. Using accessory muscles, tachypneic. No wheeze Abdomen: Morbidly obese, soft, nontender. + distended Unable to assess for hepatosplenomegaly due to size. No bruits or masses. Good bowel  sounds. Extremities: no cyanosis, clubbing, rash. 2-3+ pedal and pretibial edema into thighs. Warm Neuro: alert & orientedx3, cranial nerves grossly intact. moves all 4 extremities w/o difficulty.    Telemetry: Afib RVR with rates in the 130-140's. Personally reviewed      Labs: CBC  Recent Labs  12/29/16 0444 12/30/16 0500  WBC 7.4 6.8  HGB 7.6* 7.4*  HCT 27.1* 26.2*  MCV 90.3 90.3  PLT 145* 253*   Basic Metabolic Panel  Recent Labs  12/28/16 1200  12/29/16 1626 12/30/16 0500  NA 143  < > 141 140  K 4.5  < > 3.8 3.5  CL 100*  < > 98* 97*  CO2 34*  < > 33* 30  GLUCOSE 142*  < > 119* 98  BUN 65*  < > 61* 47*  CREATININE 3.68*  < > 3.54* 2.73*  CALCIUM 8.9  < > 8.7* 8.5*  MG 2.1  --   --  2.0  PHOS  --   --  3.7 3.0  < > = values in this interval not displayed.  BNP: BNP (last 3 results)  Recent Labs  09/18/16 1126 11/07/16 1633 12/26/16 1643  BNP 991.0* 490.4* 1,116.0*    Medications:     Scheduled Medications: . Chlorhexidine Gluconate Cloth  6 each Topical Daily  . darbepoetin (ARANESP) injection - NON-DIALYSIS  150 mcg Subcutaneous Q Tue-1800  . ferrous sulfate  325 mg Oral TID WC  . ferumoxytol  510 mg Intravenous Weekly  . mouth rinse  15 mL Mouth Rinse BID  . oxybutynin  5 mg Oral BID  . senna-docusate  2 tablet Oral QHS  . sodium chloride flush  10-40 mL Intracatheter Q12H  . sodium chloride flush  3 mL Intravenous Q12H    Infusions: . amiodarone 30 mg/hr (12/29/16 2201)  . heparin 2,350 Units/hr (12/30/16 0006)  . milrinone 0.25 mcg/kg/min (12/30/16 0719)  . dialysis replacement fluid (prismasate) 300 mL/hr at 12/30/16 0718  . dialysis replacement fluid (prismasate) 300 mL/hr at 12/29/16 1427  . dialysate (PRISMASATE) 1,500 mL/hr at 12/30/16 0415    PRN Medications: sodium chloride, acetaminophen, ALPRAZolam, heparin, ondansetron (ZOFRAN) IV, oxyCODONE, polyethylene glycol, promethazine, sodium chloride, sodium chloride, sodium  chloride flush, sodium chloride flush, zolpidem   Assessment/Plan  1. Acute on chronic diastolic CHF 2. Paroxysmal atrial fibrillation on Coumadin 3. Acute on chronic kidney disease stage IV 4. Obesity hypoventilation syndrome.  5. Hyperkalemia 6. Acute RV failure/cor pulmonale 7. OSA, noncompliant with CPAP 8. Acute on chronic respiratory failure 9. Anemia  Respiratory distress this am, placed on Bipap. Making progress with CRRT and able to pull at least 249ml/hr over night, but her Afib RVR is worsening her respiratory status presently. Will discuss bedside DCCV with MD.  Milrinone 0.25mg  started 12/27/16 for RV failure, co ox 56.6->69.2->51.8->55.8 . 1728ml of urine out overnight, weight is down 14 pounds from yesterday.   CVP 19-20 this am. Creatinine 3.22->3.32->3.46->3.62->3.68->3.72.  Now with rapid Afib, on amio gtt. Will give an additional 150mg  bolus this am. INR 1.43 this am.    Hbg is 7.4 this am, no obvious source of bleeding, stable from yesterday. She was started on Aranesp per Renal   Length of Stay: [place], NP  12/30/2016, 7:21 AM  Arbutus Leas, NP-C Advanced Heart Failure Team  Pager [phone] M-F 7am-4pm.  Please contact [facility] for night-coverage after hours (4p -7a ) and weekends on [email or web address]   Agree with a above.  She is getting volume off with diuresis and CRT. However weight still up 40 pounds and respiratory status very tenuous. BIPAP started this am. Will continue with volume removal. D/w CCM at bedside and will try to avoid intubation at this time.   Remains in AF with RVR despite IV amio. Unable to sedate for cardioversion. Will try low-dose diltiazem to help with rate control. Will need DCCV at some point. Continue heparin.   Hgb dropping. Will need RBCs soon but want to wait until volume improved first. Possible can do tomorrow.   Co-ox marginal in setting of RV failure. Continue milrinone.   The patient is critically ill  with multiple organ systems failure and requires high complexity decision making for assessment and support, frequent evaluation and titration of therapies, application of advanced monitoring technologies and extensive interpretation of multiple databases.   Critical Care Time devoted to patient care services described in this note is 45 Minutes. This time reflects time of care of this signee, Dr. Pierre Bali who personally examined the patient and determined the plan of care. This critical care time does not reflect procedure time, teaching time or supervisory time of our PA/NP/resident but could involve care discussion and coordination time.  Glori Bickers, MD  9:50 AM

## 2016-12-30 NOTE — Progress Notes (Signed)
Subjective:  Started on CRRT late yesterday- made 1700 of urine and overall 3.5 liters negative thru CRRT- tolerating well  Objective Vital signs in last 24 hours: Vitals:   12/30/16 0600 12/30/16 0700 12/30/16 0744 12/30/16 0800  BP: 124/76 132/73  (!) 127/105  Pulse: (!) 119 (!) 132 90 67  Resp: 16 18 (!) 25 19  Temp:    98 F (36.7 C)  TempSrc:    Axillary  SpO2: (!) 89% 90% 92% 96%  Weight:      Height:       Weight change: -6.35 kg (-14 lb)  Intake/Output Summary (Last 24 hours) at 12/30/16 0839 Last data filed at 12/30/16 0800  Gross per 24 hour  Intake          1840.26 ml  Output             6051 ml  Net         -4210.74 ml    Assessment/Plan: 61 year old black female with morbid obesity and a multitude of pulmonary issues who presents with recurrent congestive heart failure and acute on chronic renal failure 1.Renal- acute on chronic renal failure (recent creatinine on 2/3 was 1.97)- most likely due to cardiorenal syndrome/hemodynamics as urinalysis is fairly bland and renal ultrasound is normal. Failed milrinone in an intensive care unit setting. UOP increased but unable to achieve diuresis and still very overloaded so CRRT initiated 3/21. Good results so far 2. Hypertension/volume  - volume overloaded - UOP better but not able to achieve diuresis- now CRRT initiated 3. Anemia  - due to frequent blood draws hospitalization as well as CKD. iron stores low- will give feraheme- and have added ESA.  Probably is not helping her SOB 4. Hyperkalemia-  resolved    Alcide Memoli A    Labs: Basic Metabolic Panel:  Recent Labs Lab 12/29/16 0444 12/29/16 1626 12/30/16 0500  NA 143 141 140  K 4.1 3.8 3.5  CL 98* 98* 97*  CO2 35* 33* 30  GLUCOSE 103* 119* 98  BUN 63* 61* 47*  CREATININE 3.72* 3.54* 2.73*  CALCIUM 8.8* 8.7* 8.5*  PHOS  --  3.7 3.0   Liver Function Tests:  Recent Labs Lab 12/29/16 1626 12/30/16 0500  ALBUMIN 3.0* 2.8*   No results for  input(s): LIPASE, AMYLASE in the last 168 hours. No results for input(s): AMMONIA in the last 168 hours. CBC:  Recent Labs Lab 12/26/16 1502 12/27/16 0158 12/27/16 0810 12/28/16 0500 12/29/16 0444 12/30/16 0500  WBC 11.1* 11.4* 10.1 9.3 7.4 6.8  NEUTROABS 8.1*  --   --   --   --   --   HGB 9.5* 9.6* 9.2* 7.9* 7.6* 7.4*  HCT 34.6* 35.1* 33.0* 28.1* 27.1* 26.2*  MCV 90.8 93.1 93.0 90.4 90.3 90.3  PLT 304 213 189 207 145* 134*   Cardiac Enzymes: No results for input(s): CKTOTAL, CKMB, CKMBINDEX, TROPONINI in the last 168 hours. CBG:  Recent Labs Lab 12/26/16 1645  GLUCAP 88    Iron Studies:   Recent Labs  12/29/16 0444  IRON 64  TIBC 274  FERRITIN 115   Studies/Results: Dg Chest Port 1 View  Result Date: 12/29/2016 CLINICAL DATA:  Encounter for central line placement. EXAM: PORTABLE CHEST 1 VIEW COMPARISON:  Radiograph of December 27, 2016. FINDINGS: Stable cardiomegaly. Bilateral perihilar interstitial densities are noted concerning for edema. Interval placement of left internal jugular dialysis catheter with distal tips in expected position of cavoatrial junction. Stable position of right subclavian  catheter with distal tip in expected position of the SVC. No pneumothorax is noted. Possible mild bilateral pleural effusions are noted. Bony thorax is unremarkable. IMPRESSION: Stable cardiomegaly is noted with probable bilateral perihilar edema and mild pleural effusions. Aortic atherosclerosis. Stable position of right subclavian catheter. Interval placement of left internal jugular dialysis catheter with distal tip in expected position of cavoatrial junction. Electronically Signed   By: Marijo Conception, M.D.   On: 12/29/2016 14:23   Medications: Infusions: . amiodarone 30 mg/hr (12/30/16 0736)  . heparin 2,350 Units/hr (12/30/16 0736)  . milrinone 0.25 mcg/kg/min (12/30/16 0719)  . dialysis replacement fluid (prismasate) 300 mL/hr at 12/30/16 0718  . dialysis replacement  fluid (prismasate) 300 mL/hr at 12/30/16 0752  . dialysate (PRISMASATE) 1,500 mL/hr at 12/30/16 3254    Scheduled Medications: . Chlorhexidine Gluconate Cloth  6 each Topical Daily  . darbepoetin (ARANESP) injection - NON-DIALYSIS  150 mcg Subcutaneous Q Tue-1800  . ferrous sulfate  325 mg Oral TID WC  . ferumoxytol  510 mg Intravenous Weekly  . mouth rinse  15 mL Mouth Rinse BID  . oxybutynin  5 mg Oral BID  . senna-docusate  2 tablet Oral QHS  . sodium chloride flush  10-40 mL Intracatheter Q12H  . sodium chloride flush  3 mL Intravenous Q12H    have reviewed scheduled and prn medications.  Physical Exam: General: alert-  Heart:tachy Lungs: CBS bilat Abdomen: soft,non tender Extremities: pitting edema Dialysis Access: left IJ vascath placed 3/21   12/30/2016,8:39 AM  LOS: 4 days

## 2016-12-30 NOTE — Progress Notes (Signed)
PT Cancellation Note  Patient Details Name: Paige Marshall MRN: [id number] DOB: 09-16-1956   Cancelled Treatment:    Reason Eval/Treat Not Completed: Patient not medically ready. Pt on bipap, CRRT and with high HR.   Shary Decamp [place]ok 12/30/2016, 9:35 AM Suanne Marker PT [phone]

## 2016-12-30 NOTE — Progress Notes (Signed)
RT obtained a ABG on the Pt and her PO2 was low. RT made the doctor aware and he said to increase her FIO2 to 50%. RT will continue to monitor

## 2016-12-30 NOTE — Consult Note (Signed)
PULMONARY / CRITICAL CARE MEDICINE   Name: Paige Marshall MRN: [id number] DOB: 1956-03-22    ADMISSION DATE:  12/26/2016 CONSULTATION DATE:  12/30/16  REFERRING MD:  Dr. Tommy Medal   CHIEF COMPLAINT:  Shortness of breath   HISTORY OF PRESENT ILLNESS:  61 y/o F admitted from rehab facility (resident since November 2017) on 3/18 with complaints of shortness of breath & lower extremity swelling.   The patient has a medical history of morbid obesity / wheelchair bound, untreated OHS, chronic combined systolic / diastolic CHF, NICM, AF on coumadin, HTN, 3L O2 dependent, pulmonary hypertension, former smoker (11 pk years) with COPD & CKD III.  The patient suffered a left fibular fracture in late 2017.  She was recently admitted from 1/28-11/13/16 for acute CHF exacerbation.  At that time, her discharge weight was 370lbs.    She returned from the rehab facility on 3/18 with increased SOB and lower extremity swelling.  She if followed by Dr. Gwenlyn Found for cardiology and was recently switched to lasix and metolazone.  Lasix was discontinued at the SNF due to rising sr cr.  Her admit weight was 411lbs.  She was admitted by IMTS for decompensated CHF.  She developed worsening AKI with concern for cardio-renal syndrome.  An HD catheter was inserted 3/21 and CVVHD was initiated.   She remains on milrinone, amiodarone and heparin gtt's.  Rate control has been difficult to achieve.  Over the past 24 hours, she is net negative 3.5L.  She developed respiratory distress am of 3/22 and BiPAP was initiated.    PCCM consulted for evaluation.   PAST MEDICAL HISTORY :  She  has a past medical history of Arthritis; Asthma; Chronic combined systolic and diastolic CHF (congestive heart failure) ([place]); CKD (chronic kidney disease), stage III; COPD (chronic obstructive pulmonary disease) ([place]); Degenerative joint disease of both lower legs; Dyspnea; Edema; Former smoker; Gout; H. pylori infection (01/03/2014); High cholesterol;  Hypertension; Morbid obesity ([place]); NICM (nonischemic cardiomyopathy) ([place]); Nonischemic cardiomyopathy ([place]); Normal coronary arteries; OSA (obstructive sleep apnea); Paroxysmal atrial fibrillation ([place]); Pneumonia (X 2); Pulmonary hypertension; Respiratory failure ([place]); Rheumatoid arthritis ([place]); and Stroke ([place]).  PAST SURGICAL HISTORY: She  has a past surgical history that includes Breath tek h pylori (N/A, 12/31/2013); doppler echocardiography; Angiogram/lv (congenital) (2007); sleep study (2011); stress myocardial dipyridamole perfusion; venous duplex ultrasound (2013); Coronary angioplasty with stent (11/04/2005); Tubal ligation (1982); Eye surgery; Corneal transplant (Right, ~ 2010); and left heart catheterization with coronary angiogram (N/A, 01/20/2015).  Allergies  Allergen Reactions  . Penicillins Hives, Itching, Swelling and Other (See Comments)    Tongue swelling Has patient had a PCN reaction causing immediate rash, facial/tongue/throat swelling, SOB or lightheadedness with hypotension: Yes  Clarified with the patient her penicillin allergy. Pt has tolerated cefepime in the past. Pt also states that she can take amoxicillin.    . Citrus Hives    No current facility-administered medications on file prior to encounter.    Current Outpatient Prescriptions on File Prior to Encounter  Medication Sig  . carvedilol (COREG) 6.25 MG tablet Take 1 tablet (6.25 mg total) by mouth 2 (two) times daily with a meal.  . diclofenac sodium (VOLTAREN) 1 % GEL Apply 4 g topically every 6 (six) hours as needed (pain).   [name] diltiazem (DILACOR XR) 180 MG 24 hr capsule Take 180 mg by mouth daily.  [name] docusate sodium (COLACE) 100 MG capsule Take 1 capsule (100 mg total) by mouth 2 (two) times daily.  [name]  ferrous sulfate 325 (65 FE) MG tablet Take 1 tablet (325 mg total) by mouth 3 (three) times daily with meals.  . furosemide (LASIX) 40 MG tablet Take 1 tablet (40 mg total) by mouth 2 (two) times daily.  (Patient taking differently: Take 80 mg by mouth 2 (two) times daily. )  . metolazone (ZAROXOLYN) 2.5 MG tablet Take 1 tablet (2.5 mg total) by mouth daily.  . Multiple Vitamin (MULTIVITAMIN WITH MINERALS) TABS tablet Take 1 tablet by mouth daily.  [name] omega-3 acid ethyl esters (LOVAZA) 1 g capsule Take 1 g by mouth 2 (two) times daily.  [name] oxyCODONE (OXY IR/ROXICODONE) 5 MG immediate release tablet Take 1 tablet (5 mg total) by mouth every 6 (six) hours as needed for breakthrough pain.  . polyethylene glycol (MIRALAX / GLYCOLAX) packet Take 17 g by mouth daily as needed. (Patient taking differently: Take 17 g by mouth daily as needed (constipation). Mix in 8 oz liquid and drink)  . potassium chloride SA (K-DUR,KLOR-CON) 20 MEQ tablet Take 40 mEq by mouth 3 (three) times daily.   . predniSONE (DELTASONE) 10 MG tablet Take 40 mg tablet 11/14/2016 and taper down by 10 mg daily until completed  . PROAIR HFA 108 (90 Base) MCG/ACT inhaler INHALE 2 PUFFS EVERY 6 HOURS AS NEEDED FOR WHEEZING OR SHORTNESS OF BREATH.  . promethazine (PHENERGAN) 25 MG tablet Take 25 mg by mouth every 6 (six) hours as needed for nausea or vomiting.  . propafenone (RYTHMOL) 150 MG tablet Take 1 tablet (150 mg total) by mouth every 8 (eight) hours.  . senna-docusate (SENNA S) 8.6-50 MG tablet Take 2 tablets by mouth See admin instructions. Take 2 tablet by mouth daily at bedtime, may also take 2 tablets as needed for constipation  . bisacodyl (DULCOLAX) 5 MG EC tablet Take 1 tablet (5 mg total) by mouth daily as needed for moderate constipation. (Patient not taking: Reported on 12/26/2016)  . PRESCRIPTION MEDICATION Inhale into the lungs at bedtime. BIPAP    FAMILY HISTORY:  Her indicated that her mother is deceased. She indicated that her father is deceased. She indicated that her maternal grandmother is deceased. She indicated that her maternal grandfather is deceased. She indicated that her paternal grandmother is deceased. She  indicated that her paternal grandfather is deceased. She indicated that the status of her other is unknown.    SOCIAL HISTORY: She  reports that she quit smoking about 8 years ago. Her smoking use included Cigarettes. She has a 11.55 pack-year smoking history. She has never used smokeless tobacco. She reports that she does not drink alcohol or use drugs.  REVIEW OF SYSTEMS:  Unable to complete as patient is on BiPAP  SUBJECTIVE:   VITAL SIGNS: BP (!) 127/105 (BP Location: Left Wrist)   Pulse 67   Temp 98 F (36.7 C) (Axillary)   Resp 19   Ht 5' 7.5" (1.715 m)   Wt (!) 400 lb (181.4 kg)   LMP 09/07/2011   SpO2 96%   BMI 61.72 kg/m   HEMODYNAMICS: CVP:  [20 mmHg-23 mmHg] 20 mmHg  VENTILATOR SETTINGS: FiO2 (%):  [45 %-50 %] 50 %  INTAKE / OUTPUT: I/O last 3 completed shifts: In: 3050.2 [P.O.:745; I.V.:2168.2; Other:20; IV FUXNATFTD:322] Out: 7028 [Urine:3075; GURKY:7062]  PHYSICAL EXAMINATION: General:  Morbidly obese female on bipap, NAD  HEENT: MM pink/moist, MM pink/dry PSY: calm Neuro: AAOx4, speech clear, MAE CV: s1s2 rrr, no m/r/g PULM: even/non-labored, lungs bilaterally distant but clear BJ:SEGB, non-tender, bsx4  active  Extremities: warm/dry, generalized edema  Skin: no rashes or lesions   LABS:  BMET  Recent Labs Lab 12/29/16 0444 12/29/16 1626 12/30/16 0500  NA 143 141 140  K 4.1 3.8 3.5  CL 98* 98* 97*  CO2 35* 33* 30  BUN 63* 61* 47*  CREATININE 3.72* 3.54* 2.73*  GLUCOSE 103* 119* 98    Electrolytes  Recent Labs Lab 12/28/16 1200  12/29/16 0444 12/29/16 1626 12/30/16 0500  CALCIUM 8.9  < > 8.8* 8.7* 8.5*  MG 2.1  --   --   --  2.0  PHOS  --   --   --  3.7 3.0  < > = values in this interval not displayed.  CBC  Recent Labs Lab 12/28/16 0500 12/29/16 0444 12/30/16 0500  WBC 9.3 7.4 6.8  HGB 7.9* 7.6* 7.4*  HCT 28.1* 27.1* 26.2*  PLT 207 145* 134*    Coag's  Recent Labs Lab 12/28/16 1200 12/29/16 0444  12/30/16 0500  INR 2.99 1.92 1.43    Sepsis Markers No results for input(s): LATICACIDVEN, PROCALCITON, O2SATVEN in the last 168 hours.  ABG No results for input(s): PHART, PCO2ART, PO2ART in the last 168 hours.  Liver Enzymes  Recent Labs Lab 12/29/16 1626 12/30/16 0500  ALBUMIN 3.0* 2.8*    Cardiac Enzymes No results for input(s): TROPONINI, PROBNP in the last 168 hours.  Glucose  Recent Labs Lab 12/26/16 1645  GLUCAP 88    Imaging Dg Chest Port 1 View  Result Date: 12/30/2016 CLINICAL DATA:  Respiratory failure.  Hypoxia.  Tachycardia. EXAM: PORTABLE CHEST 1 VIEW COMPARISON:  One-view chest x-ray 12/29/2016 FINDINGS: Heart is enlarged. A right subclavian and left IJ catheter are stable. Edema and bilateral pleural effusions are not significantly changed. Bibasilar airspace disease is noted. IMPRESSION: 1. Stable findings of congestive heart failure. 2. Support apparatus is stable. Electronically Signed   By: San Morelle M.D.   On: 12/30/2016 08:50   Dg Chest Port 1 View  Result Date: 12/29/2016 CLINICAL DATA:  Encounter for central line placement. EXAM: PORTABLE CHEST 1 VIEW COMPARISON:  Radiograph of December 27, 2016. FINDINGS: Stable cardiomegaly. Bilateral perihilar interstitial densities are noted concerning for edema. Interval placement of left internal jugular dialysis catheter with distal tips in expected position of cavoatrial junction. Stable position of right subclavian catheter with distal tip in expected position of the SVC. No pneumothorax is noted. Possible mild bilateral pleural effusions are noted. Bony thorax is unremarkable. IMPRESSION: Stable cardiomegaly is noted with probable bilateral perihilar edema and mild pleural effusions. Aortic atherosclerosis. Stable position of right subclavian catheter. Interval placement of left internal jugular dialysis catheter with distal tip in expected position of cavoatrial junction. Electronically Signed   By:  Marijo Conception, M.D.   On: 12/29/2016 14:23     STUDIES:  US Renal 3/18 >> normal appearance of kidneys, moderate lower abdominal ascites  CULTURES:   ANTIBIOTICS:   SIGNIFICANT EVENTS: 3/18  Admit with SOB, increased LE swelling > decompensated CHF  3/21  CVVHD initiated  3/22  PCCM consulted for respiratory distress   LINES/TUBES: R High [place] TLC 3/19 >>  L IJ HD Cath 3/21 >>   DISCUSSION: 61 y/o F admitted with decompensated CHF, volume overload, AKI with concern for cardiorenal syndrome.  CVVHD in progress.    ASSESSMENT / PLAN:  PULMONARY A: Acute Respiratory Distress  Obesity Hypoventilation Syndrome  P:   Continue intermittent BiPAP, hopeful to avoid intubation with volume removal  Wean O2 for sats > 90% Trend CXR  Volume removal per CVVHD  CARDIOVASCULAR A:  AF with RVR - on coumadin  Acute on Chronic Diastolic CHF with Volume Overload Acute RV Failure / Cor Pulmonale  HTN  P:  CHF Service following, appreciate input  Continue amiodarone, milrinone, heparin gtt's Trend CVP, Co-Ox Rate control > may need cardioversion but will need to remove volume first  RENAL A:   Acute on Chronic Renal Failure - suspect cardiorenal syndrome, renal US normal Hyperkalemia P:   Trend BMP / urinary output Replace electrolytes as indicated Avoid nephrotoxic agents, ensure adequate renal perfusion Nephrology following, appreciate input  CVVHD for negative balance, pulling net neg 266ml/hr  GASTROINTESTINAL A:   Morbid Obesity  P:   NPO while on BiPAP  Heart healthy, low sodium diet as tolerated otherwise 1200 ml fluid restriction   HEMATOLOGIC A:   Anemia - suspect in setting of CKD + frequent phlebotomy  Chronic Anticoagulation P:  Trend CBC  Minimize lab draws as able Heparin gtt per pharmacy, appreciate assistance   INFECTIOUS A:   No acute infectious process  P:   Trend fever curve, WBC trend   ENDOCRINE A:   No acute issues  P:   Follow glucose  on BMP   NEUROLOGIC A:   Anxiety  P:   RASS goal: n/a Minimize sedating medications  PT efforts, mobilize as able PRN xanax for anxiety    FAMILY  - Updates: Patient updated at bedside 3/22 am.   - Inter-disciplinary family meet or Palliative Care meeting due by:  3/29   NP CC Time: 30 minutes   Noe Gens, NP-C [facility] Pgr: [phone] or if no answer [phone] 12/30/2016, 8:58 AM   ATTENDING NOTE / ATTESTATION NOTE :   I have discussed the case with the resident/APP  Noe Gens NP.   I agree with the resident/APP's  history, physical examination, assessment, and plans.    I have edited the above note and modified it according to our agreed history, physical examination, assessment and plan.   Briefly, 61 y/o F admitted from rehab facility (resident since November 2017) on 3/18 with complaints of shortness of breath & lower extremity swelling.   The patient has a medical history of morbid obesity / wheelchair bound, untreated OHS, chronic combined systolic / diastolic CHF, NICM, AF on coumadin, HTN, 3L O2 dependent, pulmonary hypertension, former smoker (11 pk years) with COPD & CKD III.  The patient suffered a left fibular fracture in late 2017.  She was recently admitted from 1/28-11/13/16 for acute CHF exacerbation.  At that time, her discharge weight was 370lbs.    She returned from the rehab facility on 3/18 with increased SOB and lower extremity swelling.  She if followed by Dr. Gwenlyn Found for cardiology and was recently switched to lasix and metolazone.  Lasix was discontinued at the SNF due to rising sr cr.  Her admit weight was 411lbs.  She was admitted by IMTS for decompensated CHF.  She developed worsening AKI with concern for cardio-renal syndrome.  An HD catheter was inserted 3/21 and CVVHD was initiated.   She remains on milrinone, amiodarone and heparin gtt's.  Rate control has been difficult to achieve.  Over the past 24 hours, she is net  negative 3.5L.  She developed respiratory distress am of 3/22 and BiPAP was initiated.    Bipap was started on 3/21 but she got "claustrophobic" so it was eventually discontinued.  She was placed on VM but was noted to be more SOB off Bipap. I placed her on noninvasive ventilator/BiPAP. I tweaked her settings and currently she is on IPAP 12, EPAP 5, with tidal volumes of 500-600 ML's and she was comfortable with a full facemask. Her oxygen saturation was 99-100 percent on 50% FiO2 so I decreased her FiO2 to 40%. She had an ABG on BiPAP 40% which showed pH 7.4, PCO2 48, PO2 48, 84% O2 saturation. It was not correlating with the monitor. She was 99-100 percent on the monitor. It  was probably mixed venous gas.    Vitals:  Vitals:   12/30/16 0900 12/30/16 1000 12/30/16 1100 12/30/16 1118  BP: (!) 129/118 (!) 137/95 (!) 130/95   Pulse: 63 (!) 138 66   Resp: 17 20 18    Temp:    97.2 F (36.2 C)  TempSrc:    Axillary  SpO2: 97% 100% 96%   Weight:      Height:        Constitutional/General: Morbidly obese, in mild distress on the BiPAP, significantly better though compared to being on Ventimask. Some accessory muscle use every now and then.  Body mass index is 61.72 kg/m. Wt Readings from Last 3 Encounters:  12/30/16 (!) 181.4 kg (400 lb)  12/22/16 (!) 177.8 kg (392 lb)  12/15/16 (!) 182.8 kg (403 lb)    HEENT: PERLA, anicteric sclerae. (-) Oral thrush.  Neck: No masses. Midline trachea. No JVD, (-) LAD. (-) bruits appreciated. Short stout neck  Respiratory/Chest: Grossly normal chest. (-) deformity. (-) Accessory muscle use.  Symmetric expansion. Diminished BS on both lower lung zones. (-) wheezing, Crackles mid-bases (-) egophony  Cardiovascular: Regular rate and  rhythm, heart sounds normal, no murmur or gallops,  Gr 2  peripheral edema  Gastrointestinal:  Normal bowel sounds. Soft, non-tender. No hepatosplenomegaly.  (-) masses.   Musculoskeletal:  Normal muscle tone.    Extremities: Grossly normal. (-) clubbing, cyanosis.  Gr 2 edema  Skin: (-) rash,lesions seen.   Neurological/Psychiatric :  CN grossly intact. (-) lateralizing signs.     CBC Recent Labs     12/28/16  0500  12/29/16  0444  12/30/16  0500  WBC  9.3  7.4  6.8  HGB  7.9*  7.6*  7.4*  HCT  28.1*  27.1*  26.2*  PLT  207  145*  134*    Coag's Recent Labs     12/28/16  1200  12/29/16  0444  12/30/16  0500  INR  2.99  1.92  1.43    BMET Recent Labs     12/29/16  0444  12/29/16  1626  12/30/16  0500  NA  143  141  140  K  4.1  3.8  3.5  CL  98*  98*  97*  CO2  35*  33*  30  BUN  63*  61*  47*  CREATININE  3.72*  3.54*  2.73*  GLUCOSE  103*  119*  98    Electrolytes Recent Labs     12/28/16  1200   12/29/16  0444  12/29/16  1626  12/30/16  0500  CALCIUM  8.9   < >  8.8*  8.7*  8.5*  MG  2.1   --    --    --   2.0  PHOS   --    --    --   3.7  3.0   < > = values in  this interval not displayed.    Sepsis Markers No results for input(s): PROCALCITON, O2SATVEN in the last 72 hours.  Invalid input(s): LACTICACIDVEN  ABG Recent Labs     12/30/16  0938  PHART  7.414  PCO2ART  47.8  PO2ART  48.0*    Liver Enzymes Recent Labs     12/29/16  1626  12/30/16  0500  ALBUMIN  3.0*  2.8*    Cardiac Enzymes No results for input(s): TROPONINI, PROBNP in the last 72 hours.  Glucose No results for input(s): GLUCAP in the last 72 hours.  Imaging Dg Chest Port 1 View  Result Date: 12/30/2016 CLINICAL DATA:  Respiratory failure.  Hypoxia.  Tachycardia. EXAM: PORTABLE CHEST 1 VIEW COMPARISON:  One-view chest x-ray 12/29/2016 FINDINGS: Heart is enlarged. A right subclavian and left IJ catheter are stable. Edema and bilateral pleural effusions are not significantly changed. Bibasilar airspace disease is noted. IMPRESSION: 1. Stable findings of congestive heart failure. 2. Support apparatus is stable. Electronically Signed   By: San Morelle M.D.   On:  12/30/2016 08:50   Dg Chest Port 1 View  Result Date: 12/29/2016 CLINICAL DATA:  Encounter for central line placement. EXAM: PORTABLE CHEST 1 VIEW COMPARISON:  Radiograph of December 27, 2016. FINDINGS: Stable cardiomegaly. Bilateral perihilar interstitial densities are noted concerning for edema. Interval placement of left internal jugular dialysis catheter with distal tips in expected position of cavoatrial junction. Stable position of right subclavian catheter with distal tip in expected position of the SVC. No pneumothorax is noted. Possible mild bilateral pleural effusions are noted. Bony thorax is unremarkable. IMPRESSION: Stable cardiomegaly is noted with probable bilateral perihilar edema and mild pleural effusions. Aortic atherosclerosis. Stable position of right subclavian catheter. Interval placement of left internal jugular dialysis catheter with distal tip in expected position of cavoatrial junction. Electronically Signed   By: Marijo Conception, M.D.   On: 12/29/2016 14:23   Assessment/Plan : Acute on chronic hypoxemic hypercapnic respiratory failure secondary to the following: Volume overload, pulmonary edema Obstructive sleep apnea, not tolerant to CPAP Obesity hypoventilation syndrome Restrictive ventilatory defect Morbid obesity.  Pulmonary hypertension h/o asthma/copd, not in exacerbation No evidence for PNA at this point P: - I think she is significantly better on the BiPAP as long as she keeps it on. She is currently on 12/5, tidal volume covering 500-600 ML's, O2 saturation 99-100 percent on 50% FiO2. Obviously, she is still at risk for intubation. I extensively discussed with her regarding the need for BiPAP. She remains a full code. I told her if she remains a full code, she should keep the BiPAP on for the most part today. If she worsens, she is okay with intubation. It does not seem that claustrophobia is an issue right now. She seems to be tolerating her mask. - Cardiology  service is following patient. We'll defer diuresis to them. - She is currently on CRRT. Renal service is following. Suggest to take off fluid as much as we can. - keep NPO for now.  May need to be intubated.    AF with RVR  Acute on Chronic Diastolic CHF with Volume Overload Acute RV Failure / Cor Pulmonale  HTN  P:  CHF Service following, appreciate input  Continue amiodarone, milrinone, heparin gtt's Trend CVP, Co-Ox   AKI 2/2 above, volume overload P: - Renal service following - cont CRRT > plan to take off as much fluid as possible.    Best practice: Heparin drip Will need PPI  for SUP   I spent  30  minutes of Critical Care time with this patient today. This is my time spent independent of the APP or resident.   Family :  No family at bedside. I extensively discussed plans with the patient.    Monica Becton, MD 12/30/2016, 11:28 AM  Pulmonary and Critical Care Pager [phone] After 3 pm or if no answer, call [phone]

## 2016-12-30 NOTE — Consult Note (Signed)
   [facility] CM Inpatient Consult   12/30/2016  Paige Marshall 1956-07-05 [id number]   Chart reviewed for multiple admissions, patient with Medicare ACO noted.  Chart reviewed and patient was admitted from a skilled nursing facility, [facility]  MD notes chart review reveals the patient was dmitted with volume overload, RV failure. Milrinone 0.69mcg started 3/19. Notes reveals the patient desires to discharge home and noted she has used her Medicare days at the facility.  Will continue to follow for progress and disposition needs.  Patient currently in ICU, will follow up for progress and community care management needs.  For questions or referrals, please contact:  Natividad Brood, RN BSN [facility] Liaison  [phone] business mobile phone Toll free office [phone]

## 2016-12-30 NOTE — Progress Notes (Signed)
Pt is on NIV at this time tolerating it well no distress noted.

## 2016-12-30 NOTE — Progress Notes (Signed)
ANTICOAGULATION CONSULT NOTE - Follow Up Consult  Pharmacy Consult for Heparin Indication: atrial fibrillation  Allergies  Allergen Reactions  . Penicillins Hives, Itching, Swelling and Other (See Comments)    Tongue swelling Has patient had a PCN reaction causing immediate rash, facial/tongue/throat swelling, SOB or lightheadedness with hypotension: Yes  Clarified with the patient her penicillin allergy. Pt has tolerated cefepime in the past. Pt also states that she can take amoxicillin.    . Citrus Hives    Patient Measurements: Height: 5' 7.5" (171.5 cm) Weight: (!) 400 lb (181.4 kg) IBW/kg (Calculated) : 62.75 Heparin Dosing Weight: 111kg  Vital Signs: Temp: 98 F (36.7 C) (03/22 0800) Temp Source: Axillary (03/22 0800) BP: 129/118 (03/22 0900) Pulse Rate: 63 (03/22 0900)  Labs:  Recent Labs  12/28/16 0500  12/28/16 1200  12/29/16 0444 12/29/16 1626 12/29/16 2305 12/30/16 0500  HGB 7.9*  --   --   --  7.6*  --   --  7.4*  HCT 28.1*  --   --   --  27.1*  --   --  26.2*  PLT 207  --   --   --  145*  --   --  134*  LABPROT  --   < > 31.7*  --  22.2*  --   --  17.6*  INR  --   < > 2.99  --  1.92  --   --  1.43  HEPARINUNFRC  --   --   --   --   --   --  0.50 0.57  CREATININE  --   --  3.68*  < > 3.72* 3.54*  --  2.73*  < > = values in this interval not displayed.  Estimated Creatinine Clearance: 39.1 mL/min (A) (by C-G formula based on SCr of 2.73 mg/dL (H)).   Medications:  Heparin @ 2350 units/hr  Assessment: 60yof on coumadin pta for afib, admitted with volume overload and renal failure. INR 2.84 on admit and coumadin initially resumed (received one dose of 5mg ). On 3/20, coumadin held, and she received 5mg  vitamin k to reverse INR anticipating need for CRRT. INR down to 1.9 yesterday and heparin bridge started. Heparin level is therapeutic at 0.57. Hgb continues to trend down to 7.4. No bleeding.  Goal of Therapy:  Heparin level 0.3-0.7 units/ml Monitor  platelets by anticoagulation protocol: Yes   Plan:  1) Continue heparin at 2350 units/hr 2) Daily heparin level and CBC  Deboraha Sprang 12/30/2016,10:35 AM

## 2016-12-31 DIAGNOSIS — N170 Acute kidney failure with tubular necrosis: Secondary | ICD-10-CM

## 2016-12-31 DIAGNOSIS — N184 Chronic kidney disease, stage 4 (severe): Secondary | ICD-10-CM

## 2016-12-31 DIAGNOSIS — I509 Heart failure, unspecified: Secondary | ICD-10-CM

## 2016-12-31 LAB — RENAL FUNCTION PANEL
ALBUMIN: 2.8 g/dL — AB (ref 3.5–5.0)
ANION GAP: 11 (ref 5–15)
Albumin: 2.9 g/dL — ABNORMAL LOW (ref 3.5–5.0)
Anion gap: 9 (ref 5–15)
BUN: 30 mg/dL — AB (ref 6–20)
BUN: 25 mg/dL — AB (ref 6–20)
CALCIUM: 8.5 mg/dL — AB (ref 8.9–10.3)
CHLORIDE: 100 mmol/L — AB (ref 101–111)
CO2: 30 mmol/L (ref 22–32)
CO2: 29 mmol/L (ref 22–32)
CREATININE: 2.06 mg/dL — AB (ref 0.44–1.00)
Calcium: 8.5 mg/dL — ABNORMAL LOW (ref 8.9–10.3)
Chloride: 97 mmol/L — ABNORMAL LOW (ref 101–111)
Creatinine, Ser: 1.84 mg/dL — ABNORMAL HIGH (ref 0.44–1.00)
GFR calc Af Amer: 34 mL/min — ABNORMAL LOW (ref >60)
GFR, EST AFRICAN AMERICAN: 29 mL/min — AB (ref >60)
GFR, EST NON AFRICAN AMERICAN: 25 mL/min — AB (ref >60)
GFR, EST NON AFRICAN AMERICAN: 29 mL/min — AB (ref >60)
GLUCOSE: 94 mg/dL (ref 65–99)
GLUCOSE: 125 mg/dL — AB (ref 65–99)
PHOSPHORUS: 2.1 mg/dL — AB (ref 2.5–4.6)
POTASSIUM: 3.8 mmol/L (ref 3.5–5.1)
POTASSIUM: 3.8 mmol/L (ref 3.5–5.1)
Phosphorus: 1.7 mg/dL — ABNORMAL LOW (ref 2.5–4.6)
SODIUM: 138 mmol/L (ref 135–145)
Sodium: 138 mmol/L (ref 135–145)

## 2016-12-31 LAB — HEPARIN LEVEL (UNFRACTIONATED): HEPARIN UNFRACTIONATED: 0.61 [IU]/mL (ref 0.30–0.70)

## 2016-12-31 LAB — BASIC METABOLIC PANEL
Anion gap: 12 (ref 5–15)
BUN: 30 mg/dL — AB (ref 6–20)
CALCIUM: 8.5 mg/dL — AB (ref 8.9–10.3)
CO2: 29 mmol/L (ref 22–32)
CREATININE: 2.1 mg/dL — AB (ref 0.44–1.00)
Chloride: 97 mmol/L — ABNORMAL LOW (ref 101–111)
GFR calc non Af Amer: 25 mL/min — ABNORMAL LOW (ref >60)
GFR, EST AFRICAN AMERICAN: 29 mL/min — AB (ref >60)
Glucose, Bld: 98 mg/dL (ref 65–99)
Potassium: 3.8 mmol/L (ref 3.5–5.1)
Sodium: 138 mmol/L (ref 135–145)

## 2016-12-31 LAB — COOXEMETRY PANEL
Carboxyhemoglobin: 2.7 % — ABNORMAL HIGH (ref 0.5–1.5)
Methemoglobin: 1.2 % (ref 0.0–1.5)
O2 SAT: 51.8 %
Total hemoglobin: 7.7 g/dL — ABNORMAL LOW (ref 12.0–16.0)

## 2016-12-31 LAB — CBC
HEMATOCRIT: 26.6 % — AB (ref 36.0–46.0)
Hemoglobin: 7.6 g/dL — ABNORMAL LOW (ref 12.0–15.0)
MCH: 25.7 pg — AB (ref 26.0–34.0)
MCHC: 28.6 g/dL — AB (ref 30.0–36.0)
MCV: 89.9 fL (ref 78.0–100.0)
Platelets: 146 10*3/uL — ABNORMAL LOW (ref 150–400)
RBC: 2.96 MIL/uL — ABNORMAL LOW (ref 3.87–5.11)
RDW: 27.1 % — AB (ref 11.5–15.5)
WBC: 8.2 10*3/uL (ref 4.0–10.5)

## 2016-12-31 LAB — PROTIME-INR
INR: 1.32
PROTHROMBIN TIME: 16.5 s — AB (ref 11.4–15.2)

## 2016-12-31 LAB — MAGNESIUM: Magnesium: 2.2 mg/dL (ref 1.7–2.4)

## 2016-12-31 NOTE — Progress Notes (Signed)
Subjective:  CRRT running well- minus 6 liters from yesterday to today - afib is issue- an amio and cardizem   Objective Vital signs in last 24 hours: Vitals:   12/31/16 0700 12/31/16 0800 12/31/16 0900 12/31/16 1000  BP: 112/79 118/68 107/78 94/76  Pulse: (!) 129 (!) 134 (!) 138 (!) 129  Resp: 18 15 19 17   Temp:  97.3 F (36.3 C)    TempSrc:  Axillary    SpO2: 95% 97% 97% 98%  Weight:      Height:       Weight change: -4.082 kg (-9 lb)  Intake/Output Summary (Last 24 hours) at 12/31/16 1050 Last data filed at 12/31/16 1000  Gross per 24 hour  Intake          1748.89 ml  Output             8043 ml  Net         -6294.11 ml    Assessment/Plan: 61 year old black female with morbid obesity and Marshall multitude of pulmonary issues who presents with recurrent congestive heart failure and acute on chronic renal failure 1.Renal- acute on chronic renal failure (recent creatinine on 2/3 was 1.97)- most likely due to cardiorenal syndrome/hemodynamics as urinalysis is fairly bland and renal ultrasound is normal. Failed milrinone in an intensive care unit setting so CRRT initiated 3/21. Good results so far 2. Hypertension/volume  - volume overloaded - UOP better but not able to achieve diuresis- now CRRT initiated.  CVP still close to 20- continue 200 off per hour UF 3. Anemia  - due to frequent blood draws hospitalization as well as CKD. iron stores low-  giving feraheme- and have added ESA.  Probably is not helping her SOB- consider transfusion 4. Hyperkalemia-  resolved    Paige Marshall    Labs: Basic Metabolic Panel:  Recent Labs Lab 12/30/16 0500 12/30/16 1600 12/31/16 0300 12/31/16 0332  NA 140 138 138 138  K 3.5 3.6 3.8 3.8  CL 97* 98* 97* 97*  CO2 30 31 30 29   GLUCOSE 98 117* 94 98  BUN 47* 39* 30* 30*  CREATININE 2.73* 2.37* 2.06* 2.10*  CALCIUM 8.5* 8.3* 8.5* 8.5*  PHOS 3.0 2.4* 2.1*  --    Liver Function Tests:  Recent Labs Lab 12/30/16 0500 12/30/16 1600  12/31/16 0300  ALBUMIN 2.8* 2.9* 2.8*   No results for input(s): LIPASE, AMYLASE in the last 168 hours. No results for input(s): AMMONIA in the last 168 hours. CBC:  Recent Labs Lab 12/26/16 1502  12/27/16 0810 12/28/16 0500  12/29/16 0444 12/30/16 0500 12/31/16 0332  WBC 11.1*  < > 10.1 9.3  --  7.4 6.8 8.2  NEUTROABS 8.1*  --   --   --   --   --   --   --   HGB 9.5*  < > 9.2* 7.9*  < > 7.6* 7.4* 7.6*  HCT 34.6*  < > 33.0* 28.1*  < > 27.1* 26.2* 26.6*  MCV 90.8  < > 93.0 90.4  --  90.3 90.3 89.9  PLT 304  < > 189 207  --  145* 134* 146*  < > = values in this interval not displayed. Cardiac Enzymes: No results for input(s): CKTOTAL, CKMB, CKMBINDEX, TROPONINI in the last 168 hours. CBG:  Recent Labs Lab 12/26/16 1645  GLUCAP 88    Iron Studies:   Recent Labs  12/29/16 0444  IRON 64  TIBC 274  FERRITIN 115   Studies/Results:  Dg Chest Port 1 View  Result Date: 12/30/2016 CLINICAL DATA:  Respiratory failure.  Hypoxia.  Tachycardia. EXAM: PORTABLE CHEST 1 VIEW COMPARISON:  One-view chest x-ray 12/29/2016 FINDINGS: Heart is enlarged. Marshall right subclavian and left IJ catheter are stable. Edema and bilateral pleural effusions are not significantly changed. Bibasilar airspace disease is noted. IMPRESSION: 1. Stable findings of congestive heart failure. 2. Support apparatus is stable. Electronically Signed   By: San Morelle M.D.   On: 12/30/2016 08:50   Dg Chest Port 1 View  Result Date: 12/29/2016 CLINICAL DATA:  Encounter for central line placement. EXAM: PORTABLE CHEST 1 VIEW COMPARISON:  Radiograph of December 27, 2016. FINDINGS: Stable cardiomegaly. Bilateral perihilar interstitial densities are noted concerning for edema. Interval placement of left internal jugular dialysis catheter with distal tips in expected position of cavoatrial junction. Stable position of right subclavian catheter with distal tip in expected position of the SVC. No pneumothorax is noted.  Possible mild bilateral pleural effusions are noted. Bony thorax is unremarkable. IMPRESSION: Stable cardiomegaly is noted with probable bilateral perihilar edema and mild pleural effusions. Aortic atherosclerosis. Stable position of right subclavian catheter. Interval placement of left internal jugular dialysis catheter with distal tip in expected position of cavoatrial junction. Electronically Signed   By: Marijo Conception, M.D.   On: 12/29/2016 14:23   Medications: Infusions: . amiodarone 30 mg/hr (12/30/16 2357)  . diltiazem (CARDIZEM) infusion 10 mg/hr (12/31/16 0158)  . heparin 2,350 Units/hr (12/31/16 0845)  . milrinone 0.25 mcg/kg/min (12/31/16 0845)  . dialysis replacement fluid (prismasate) 300 mL/hr at 12/30/16 0718  . dialysis replacement fluid (prismasate) 300 mL/hr at 12/31/16 0013  . dialysate (PRISMASATE) 1,500 mL/hr at 12/31/16 1003    Scheduled Medications: . Chlorhexidine Gluconate Cloth  6 each Topical Daily  . darbepoetin (ARANESP) injection - NON-DIALYSIS  150 mcg Subcutaneous Q Tue-1800  . ferrous sulfate  325 mg Oral TID WC  . ferumoxytol  510 mg Intravenous Weekly  . mouth rinse  15 mL Mouth Rinse BID  . oxybutynin  5 mg Oral BID  . senna-docusate  2 tablet Oral QHS  . sodium chloride flush  10-40 mL Intracatheter Q12H  . sodium chloride flush  3 mL Intravenous Q12H    have reviewed scheduled and prn medications.  Physical Exam: General: alert-  Heart:tachy Lungs: CBS bilat Abdomen: soft,non tender Extremities: pitting edema Dialysis Access: left IJ vascath placed 3/21   12/31/2016,10:50 AM  LOS: 5 days

## 2016-12-31 NOTE — Progress Notes (Signed)
ANTICOAGULATION CONSULT NOTE - Follow Up Consult  Pharmacy Consult for Heparin Indication: atrial fibrillation  Allergies  Allergen Reactions  . Penicillins Hives, Itching, Swelling and Other (See Comments)    Tongue swelling Has patient had a PCN reaction causing immediate rash, facial/tongue/throat swelling, SOB or lightheadedness with hypotension: Yes  Clarified with the patient her penicillin allergy. Pt has tolerated cefepime in the past. Pt also states that she can take amoxicillin.    . Citrus Hives    Patient Measurements: Height: 5' 7.5" (171.5 cm) Weight: (!) 391 lb (177.4 kg) IBW/kg (Calculated) : 62.75 Heparin Dosing Weight: 111kg  Vital Signs: Temp: 96.3 F (35.7 C) (03/23 1115) Temp Source: Axillary (03/23 1115) BP: 94/76 (03/23 1000) Pulse Rate: 129 (03/23 1000)  Labs:  Recent Labs  12/29/16 0444  12/29/16 2305 12/30/16 0500 12/30/16 1600 12/31/16 0300 12/31/16 0332 12/31/16 0723  HGB 7.6*  --   --  7.4*  --   --  7.6*  --   HCT 27.1*  --   --  26.2*  --   --  26.6*  --   PLT 145*  --   --  134*  --   --  146*  --   LABPROT 22.2*  --   --  17.6*  --   --  16.5*  --   INR 1.92  --   --  1.43  --   --  1.32  --   HEPARINUNFRC  --   --  0.50 0.57  --   --   --  0.61  CREATININE 3.72*  < >  --  2.73* 2.37* 2.06* 2.10*  --   < > = values in this interval not displayed.  Estimated Creatinine Clearance: 50.1 mL/min (A) (by C-G formula based on SCr of 2.1 mg/dL (H)).   Medications:  Heparin @ 2350 units/hr  Assessment: 60yof on coumadin pta for afib, admitted with volume overload and renal failure. INR 2.84 on admit and coumadin initially resumed (received one dose of 5mg ). On 3/20, coumadin held, and she received 5mg  vitamin k to reverse INR anticipating need for CRRT.  INR down to 1.3. Heparin level is therapeutic at 0.6. Hgb stable but low at 7.6. No bleeding.  Goal of Therapy:  Heparin level 0.3-0.7 units/ml Monitor platelets by  anticoagulation protocol: Yes   Plan:  1) Continue heparin at 2350 units/hr 2) Daily heparin level and CBC  Erin Hearing PharmD., BCPS Clinical Pharmacist Pager [phone] 12/31/2016 12:31 PM

## 2016-12-31 NOTE — Progress Notes (Signed)
[facility]  Patient Status: Active (receiving services up to time of hospitalization)  [facility] is providing the following services: RN, PT, OT and HHA  Referred for [facility] services but readmitted to the Hospital prior to start of care.  If patient discharges after hours, please call [phone].   Paige Marshall 12/31/2016, 12:23 PM

## 2016-12-31 NOTE — Progress Notes (Signed)
Pt titrated off the NIV as tolerated. To a Venti mask. Pt nose is bleeding, notable due to prolong use of NIV. Towel was given to wipe up blood from nares. RN made aware.

## 2016-12-31 NOTE — Progress Notes (Signed)
Pt with increased ectopy, up to 20 beat run PVCs. Cardiology MD paged. Other VSS, on BiPAP and CRRT, pt asymptomatic. Awaiting return call, will continue to monitor.

## 2016-12-31 NOTE — Progress Notes (Signed)
Advanced Heart Failure Rounding Note  PCP: EDWARDS, Mil[place] Cage, NP  Primary Cardiologist: Dr. Gwenlyn Found   Subjective:    Admitted with volume overload, RV failure. Milrinone 0.44mcg started 3/19. Started CRRT 12/29/16.   Remains on CRT. Breathing better but still requiring BIPAP. Still in AF with RVR despite amio and cardizem. NSVT overnight  Co ox  56.6->69.2->51.8->55.8->51.8 this am. Urine output 200 overnight, 3717  out with CRRT, weight down 9 pounds from yesterday. Weight down 16 pounds total.  CVP remains elevated 19-20.   Creatinine 3.22->3.32->3.46->3.62->3.68->3.72->3.54->2.73->2.10.    Remains in AF with RVR. Rates in 130's. On Amio gtt, Cardizem gtt added yesterday. On Bipap overnight, sats in the 90's. Had an episode of NSVT last night, Mg is 2.2.     Says she feels ok this morning, doesn't feel SOB at rest but has Bipap on.   Objective:   Weight Range: (!) 391 lb (177.4 kg) Body mass index is 60.34 kg/m.   Vital Signs:   Temp:  [97 F (36.1 C)-98 F (36.7 C)] 97 F (36.1 C) (03/23 0400) Pulse Rate:  [63-147] 129 (03/23 0700) Resp:  [13-39] 18 (03/23 0700) BP: (106-137)/(58-118) 112/79 (03/23 0700) SpO2:  [90 %-100 %] 95 % (03/23 0700) FiO2 (%):  [40 %-50 %] 50 % (03/23 0320) Weight:  [391 lb (177.4 kg)] 391 lb (177.4 kg) (03/23 0600) Last BM Date: 12/30/16  Weight change: Filed Weights   12/29/16 0411 12/30/16 0400 12/31/16 0600  Weight: (!) 414 lb (187.8 kg) (!) 400 lb (181.4 kg) (!) 391 lb (177.4 kg)    Intake/Output:   Intake/Output Summary (Last 24 hours) at 12/31/16 0713 Last data filed at 12/31/16 0700  Gross per 24 hour  Intake          1720.72 ml  Output             7747 ml  Net         -6026.28 ml     Physical Exam: General: Obese female on Bipap. Otherwise comfortable HEENT: normal, atraumatic.  anicteric Neck: supple. Hard to assess JVP due to size, appears elevated. Carotids 2+ bilat; no bruits. No lymphadenopathy or thyromegaly  appreciated. Cor: PMI nonpalpable. Tachycardic, irregularly irregular. 2/6 TR no s3 Lungs: Diminished throughout on Bipap anteriorly. Tachypneic. No wheeze or rhonchi Abdomen: Morbidly obese, soft, nontender.+ distended Unable to assess for hepatosplenomegaly due to size. No bruits or masses. Good BS Extremities: no cyanosis, clubbing, rash.,2+ edema into thighs. No ulcers Warm Neuro: alert & orientedx3, cranial nerves grossly intact. moves all 4 extremities w/o difficulty.   Telemetry: Afib RVR with rates in the 130-140's. Brief episode of NSVT Personally reviewed   Labs: CBC  Recent Labs  12/30/16 0500 12/31/16 0332  WBC 6.8 8.2  HGB 7.4* 7.6*  HCT 26.2* 26.6*  MCV 90.3 89.9  PLT 134* 562*   Basic Metabolic Panel  Recent Labs  12/30/16 0500 12/30/16 1600 12/31/16 0300 12/31/16 0332  NA 140 138 138 138  K 3.5 3.6 3.8 3.8  CL 97* 98* 97* 97*  CO2 30 31 30 29   GLUCOSE 98 117* 94 98  BUN 47* 39* 30* 30*  CREATININE 2.73* 2.37* 2.06* 2.10*  CALCIUM 8.5* 8.3* 8.5* 8.5*  MG 2.0  --   --  2.2  PHOS 3.0 2.4* 2.1*  --     BNP: BNP (last 3 results)  Recent Labs  09/18/16 1126 11/07/16 1633 12/26/16 1643  BNP 991.0* 490.4* 1,116.0*  Medications:     Scheduled Medications: . Chlorhexidine Gluconate Cloth  6 each Topical Daily  . darbepoetin (ARANESP) injection - NON-DIALYSIS  150 mcg Subcutaneous Q Tue-1800  . ferrous sulfate  325 mg Oral TID WC  . ferumoxytol  510 mg Intravenous Weekly  . mouth rinse  15 mL Mouth Rinse BID  . oxybutynin  5 mg Oral BID  . senna-docusate  2 tablet Oral QHS  . sodium chloride flush  10-40 mL Intracatheter Q12H  . sodium chloride flush  3 mL Intravenous Q12H    Infusions: . amiodarone 30 mg/hr (12/30/16 2357)  . diltiazem (CARDIZEM) infusion 10 mg/hr (12/31/16 0158)  . heparin 2,350 Units/hr (12/30/16 2000)  . milrinone 0.25 mcg/kg/min (12/31/16 0154)  . dialysis replacement fluid (prismasate) 300 mL/hr at 12/30/16 0718    . dialysis replacement fluid (prismasate) 300 mL/hr at 12/31/16 0013  . dialysate (PRISMASATE) 1,500 mL/hr at 12/31/16 0646    PRN Medications: sodium chloride, acetaminophen, ALPRAZolam, heparin, ondansetron (ZOFRAN) IV, oxyCODONE, polyethylene glycol, promethazine, sodium chloride, sodium chloride, sodium chloride flush, sodium chloride flush   Assessment/Plan  1. Acute on chronic diastolic CHF 2. Paroxysmal atrial fibrillation on Coumadin 3. Acute on chronic kidney disease stage IV 4. Obesity hypoventilation syndrome.  5. Hyperkalemia 6. Acute RV failure/cor pulmonale 7. OSA, noncompliant with CPAP 8. Acute on chronic respiratory failure 9. Anemia  Making progress with CRRT and able to pull at least 256ml/hr over night, weight down 9 pounds overnight. Also making some urine - 55ml yesterday.   Milrinone 0.25mg  started 12/27/16 for RV failure, co ox 56.6->69.2->51.8->55.8->51.8 .1753ml of urine out overnight, weight is down 14 pounds from yesterday.   CVP 19-20 this am. Creatinine 3.22->3.32->3.46->3.62->3.68->3.72->3.54->2.73->2.10.  Remains in rapid Afib, on amio gtt. Cardizem gtt added yesterday for rate control.   Hbg is 7.6 this am, MD to advise transfusing today.   INR this am 1.32, on heparin.   Length of Stay: [place], NP  12/31/2016, 7:13 AM  Advanced Heart Failure Team  Pager [phone] M-F 7am-4pm.  Please contact [facility] for night-coverage after hours (4p -7a ) and weekends on [email or web address]  Agree with above.  She is now down almost 25 pounds with CVVHD and IV diuresis. Respiratory status improving but still very tenuous requiring BIPAP. On milrinone to support RV. Co-ox remains low. Anemia likely contributing  Remains in AF with RVR despite amio and diltiazem. Off coumadin and now on heparin. (had continuous coverage). I discussed possible DC-CV with Dr. Murlean Iba and we both feel that if we sedated her for DC-CV she would need to be intubated.  Thus will hold off for now.   Hgb remains low. Plan transfusion tomorrow as volume status improved.   Had run NSVT. Supping electrolytes.   The patient is critically ill with multiple organ systems failure and requires high complexity decision making for assessment and support, frequent evaluation and titration of therapies, application of advanced monitoring technologies and extensive interpretation of multiple databases.   Critical Care Time devoted to patient care services described in this note is 35 Minutes. This time reflects time of care of this signee, Dr. Pierre Bali who personally examined the patient and determined the plan of care. This critical care time does not reflect procedure time, teaching time or supervisory time of our PA/NP/resident but could involve care discussion and coordination time.  Glori Bickers, MD  9:05 PM

## 2016-12-31 NOTE — Progress Notes (Signed)
PT Cancellation Note  Patient Details Name: Paige Marshall MRN: [id number] DOB: 1956-06-13   Cancelled Treatment:    Reason Eval/Treat Not Completed: Medical issues which prohibited therapy (Breathing issues and on CRRT.  Nurse said check Monday. )Thanks.    Godfrey Pick Shadia Larose 12/31/2016, 11:42 AM [facility] [phone] (pager)

## 2016-12-31 NOTE — Care Management Note (Signed)
Case Management Note  Patient Details  Name: Paige Marshall MRN: [id number] Date of Birth: 17-Aug-1956  Subjective/Objective:   CHF, pt currently on Milrinone gtt, Amiodarone gtt, Cardizem gtt                    Action/Plan: Discharge Planning: please see previous notes  Chart reviewed. Pt has been to SNF rehab at [facility].  Plan is dc to home with HH. Pt active with [facility] for HHPT, RN, OT and aide. Will need HH orders with [facility] prior to dc. [facility]iaison, they will continue to follow post dc. Will continue to follow for dc needs.   PCP  EDWARDS, [place]   Expected Discharge Date:                  Expected Discharge Plan:  [place]  In-House Referral:  Clinical Social Work  Discharge planning Services  CM Consult  Post Acute Care Choice:  Home Health Choice offered to:  Patient  DME Arranged:    DME Agency:     HH Arranged:  RN, PT, OT [place] Agency:  [facility]  Status of Service:  In process, will continue to follow  If discussed at Long Length of Stay Meetings, dates discussed:    Additional Comments:  Erenest Rasher, RN 12/31/2016, 3:19 PM

## 2016-12-31 NOTE — Progress Notes (Signed)
PULMONARY / CRITICAL CARE MEDICINE   Name: Paige Marshall MRN: [id number] DOB: December 22, 1955    ADMISSION DATE:  12/26/2016 CONSULTATION DATE:  12/30/16  REFERRING MD:  Dr. Tommy Medal   CHIEF COMPLAINT:  Shortness of breath   HISTORY OF PRESENT ILLNESS:  60 y/o F admitted from rehab facility (resident since November 2017) on 3/18 with complaints of shortness of breath & lower extremity swelling.   The patient has a medical history of morbid obesity / wheelchair bound, untreated OHS, chronic combined systolic / diastolic CHF, NICM, AF on coumadin, HTN, 3L O2 dependent, pulmonary hypertension, former smoker (11 pk years) with COPD & CKD III.  The patient suffered a left fibular fracture in late 2017.  She was recently admitted from 1/28-11/13/16 for acute CHF exacerbation.  At that time, her discharge weight was 370lbs.    She returned from the rehab facility on 3/18 with increased SOB and lower extremity swelling.  She if followed by Dr. Gwenlyn Found for cardiology and was recently switched to lasix and metolazone.  Lasix was discontinued at the SNF due to rising sr cr.  Her admit weight was 411lbs.  She was admitted by IMTS for decompensated CHF.  She developed worsening AKI with concern for cardio-renal syndrome.  An HD catheter was inserted 3/21 and CVVHD was initiated.   She remains on milrinone, amiodarone and heparin gtt's.  Rate control has been difficult to achieve.  Over the past 24 hours, she is net negative 3.5L.  She developed respiratory distress am of 3/22 and BiPAP was initiated.    PCCM consulted for evaluation.    SUBJECTIVE:  Tolerating NIV/Bipap 12/5 50% with O2 sats 95% Less SOB.  Stayed on bipap/NIV for the most part.  Tolerating CVVH. BP OK.    VITAL SIGNS: BP 94/76   Pulse (!) 129   Temp 97.3 F (36.3 C) (Axillary)   Resp 17   Ht 5' 7.5" (1.715 m)   Wt (!) 177.4 kg (391 lb)   LMP 09/07/2011   SpO2 98%   BMI 60.34 kg/m   HEMODYNAMICS: CVP:  [19 mmHg-22 mmHg] 20  mmHg  VENTILATOR SETTINGS: FiO2 (%):  [50 %] 50 %  INTAKE / OUTPUT: I/O last 3 completed shifts: In: 2416.1 [P.O.:245; I.V.:2151.1; Other:20] Out: 22297 [Urine:1130; Other:10066]  PHYSICAL EXAMINATION: General:  Morbidly obese female on bipap, in mild distress HEENT: MM pink/moist, MM pink/dry PSY: calm, comfortable.  Neuro: AAOx4, speech clear, MAE CV: s1s2 rrr, no m/r/g PULM: even/non-labored, lungs bilaterally distant. Crackles at bases.  LG:XQJJ, non-tender, bsx4 active  Extremities: warm/dry, generalized edema  Skin: no rashes or lesions   LABS:  BMET  Recent Labs Lab 12/30/16 1600 12/31/16 0300 12/31/16 0332  NA 138 138 138  K 3.6 3.8 3.8  CL 98* 97* 97*  CO2 31 30 29   BUN 39* 30* 30*  CREATININE 2.37* 2.06* 2.10*  GLUCOSE 117* 94 98    Electrolytes  Recent Labs Lab 12/28/16 1200  12/30/16 0500 12/30/16 1600 12/31/16 0300 12/31/16 0332  CALCIUM 8.9  < > 8.5* 8.3* 8.5* 8.5*  MG 2.1  --  2.0  --   --  2.2  PHOS  --   < > 3.0 2.4* 2.1*  --   < > = values in this interval not displayed.  CBC  Recent Labs Lab 12/29/16 0444 12/30/16 0500 12/31/16 0332  WBC 7.4 6.8 8.2  HGB 7.6* 7.4* 7.6*  HCT 27.1* 26.2* 26.6*  PLT 145* 134* 146*  Coag's  Recent Labs Lab 12/29/16 0444 12/30/16 0500 12/31/16 0332  INR 1.92 1.43 1.32    Sepsis Markers No results for input(s): LATICACIDVEN, PROCALCITON, O2SATVEN in the last 168 hours.  ABG  Recent Labs Lab 12/30/16 0922 12/30/16 0938  PHART 7.406 7.414  PCO2ART 50.5* 47.8  PO2ART 31.0* 48.0*    Liver Enzymes  Recent Labs Lab 12/30/16 0500 12/30/16 1600 12/31/16 0300  ALBUMIN 2.8* 2.9* 2.8*    Cardiac Enzymes No results for input(s): TROPONINI, PROBNP in the last 168 hours.  Glucose  Recent Labs Lab 12/26/16 1645  GLUCAP 88    Imaging No results found.   STUDIES:  US Renal 3/18 >> normal appearance of kidneys, moderate lower abdominal  ascites  CULTURES:   ANTIBIOTICS:   SIGNIFICANT EVENTS: 3/18  Admit with SOB, increased LE swelling > decompensated CHF  3/21  CVVHD initiated  3/22  PCCM consulted for respiratory distress   LINES/TUBES: R [place] TLC 3/19 >>  L IJ HD Cath 3/21 >>   DISCUSSION: 61 y/o F admitted with decompensated CHF, volume overload, AKI with concern for cardiorenal syndrome.  CVVHD in progress.  Tolerating NIV/Bipap.    ASSESSMENT / PLAN:  PULMONARY A: Acute on Chronic Hypoxemic Hypercapneic Respiratory Failure 2/2 Volume overload/Pulmonary edema + CHF exacerbation pEF + OSA + OHS + Pulm HTN Pt with h/o asthma/copd but it is not in exacerbation P:   Cont NIV/Bipap 12/5 50%. Tolerating bipap/NIV. Better today.  I will keep NIV for the most part over the weekend and have breaks for  meals.  I hope we do not have to intubate.  She may need cardioversion for rapid afib but she will most likely need to be intubated for the procedure.  Holding off on cardioversion for now and reassess next week.  Continue CVVH  Wean O2 for sats > 90% Trend CXR  Volume removal per CVVH   CARDIOVASCULAR A:  AF with RVR - on coumadin  Acute on Chronic Diastolic CHF with Volume Overload Acute RV Failure / Cor Pulmonale  HTN  P:  Cont CVVH CHF Service following, appreciate input  Continue amiodarone, milrinone, heparin gtt's Trend CVP, Co-Ox Rate control > may need cardioversion but holding off as pt likely needs to be intubated for cardioversion.  Reassess next week.  HR in 120s and BP OK.    RENAL A:   Acute on Chronic Renal Failure - suspect cardiorenal syndrome, renal US normal Hyperkalemia P:   Cont CVVH Trend BMP / urinary output Replace electrolytes as indicated Avoid nephrotoxic agents, ensure adequate renal perfusion Nephrology following, appreciate input  CVVHD for negative balance, pulling net neg 242ml/hr   GASTROINTESTINAL A:   Morbid Obesity  P:   Try to have breaks off NIV/Bipap  for meals.  Heart healthy, low sodium diet as tolerated otherwise 1200 ml fluid restriction    HEMATOLOGIC A:   Anemia - suspect in setting of CKD + frequent phlebotomy  Chronic Anticoagulation P:  Trend CBC  Minimize lab draws as able Heparin gtt per pharmacy, appreciate assistance    INFECTIOUS A:   No acute infectious process  P:   Trend fever curve, WBC trend    ENDOCRINE A:   No acute issues  P:   Follow glucose on BMP    NEUROLOGIC A:   Anxiety  P:   RASS goal: n/a Minimize sedating medications  PT efforts, mobilize as able PRN xanax for anxiety. Avoid if possible.    FAMILY  -  Updates: Patient updated at bedside 3/22 am.   - Inter-disciplinary family meet or Palliative Care meeting due by:  3/29    I spent  30  minutes of Critical Care time with this patient today.   Family :  No family at bedside. I extensively discussed plans with the patient.    Monica Becton, MD 12/31/2016, 10:20 AM [facility] Pager [phone] After 3 pm or if no answer, call [phone]

## 2017-01-01 ENCOUNTER — Encounter (HOSPITAL_COMMUNITY): Payer: Self-pay

## 2017-01-01 LAB — RENAL FUNCTION PANEL
ALBUMIN: 2.6 g/dL — AB (ref 3.5–5.0)
ALBUMIN: 3.1 g/dL — AB (ref 3.5–5.0)
ANION GAP: 10 (ref 5–15)
Anion gap: 10 (ref 5–15)
BUN: 19 mg/dL (ref 6–20)
BUN: 16 mg/dL (ref 6–20)
CALCIUM: 8.7 mg/dL — AB (ref 8.9–10.3)
CO2: 29 mmol/L (ref 22–32)
CO2: 27 mmol/L (ref 22–32)
Calcium: 8.5 mg/dL — ABNORMAL LOW (ref 8.9–10.3)
Chloride: 99 mmol/L — ABNORMAL LOW (ref 101–111)
Chloride: 99 mmol/L — ABNORMAL LOW (ref 101–111)
Creatinine, Ser: 1.7 mg/dL — ABNORMAL HIGH (ref 0.44–1.00)
Creatinine, Ser: 1.78 mg/dL — ABNORMAL HIGH (ref 0.44–1.00)
GFR calc non Af Amer: 30 mL/min — ABNORMAL LOW (ref >60)
GFR, EST AFRICAN AMERICAN: 37 mL/min — AB (ref >60)
GFR, EST AFRICAN AMERICAN: 35 mL/min — AB (ref >60)
GFR, EST NON AFRICAN AMERICAN: 32 mL/min — AB (ref >60)
Glucose, Bld: 97 mg/dL (ref 65–99)
Glucose, Bld: 114 mg/dL — ABNORMAL HIGH (ref 65–99)
PHOSPHORUS: 1.3 mg/dL — AB (ref 2.5–4.6)
PHOSPHORUS: 1.7 mg/dL — AB (ref 2.5–4.6)
POTASSIUM: 4 mmol/L (ref 3.5–5.1)
Potassium: 3.8 mmol/L (ref 3.5–5.1)
SODIUM: 136 mmol/L (ref 135–145)
Sodium: 138 mmol/L (ref 135–145)

## 2017-01-01 LAB — CBC
HEMATOCRIT: 26.7 % — AB (ref 36.0–46.0)
Hemoglobin: 7.5 g/dL — ABNORMAL LOW (ref 12.0–15.0)
MCH: 25.3 pg — AB (ref 26.0–34.0)
MCHC: 28.1 g/dL — AB (ref 30.0–36.0)
MCV: 89.9 fL (ref 78.0–100.0)
PLATELETS: 190 10*3/uL (ref 150–400)
RBC: 2.97 MIL/uL — ABNORMAL LOW (ref 3.87–5.11)
RDW: 27.3 % — ABNORMAL HIGH (ref 11.5–15.5)
WBC: 7.5 10*3/uL (ref 4.0–10.5)

## 2017-01-01 LAB — HEPARIN LEVEL (UNFRACTIONATED): HEPARIN UNFRACTIONATED: 0.41 [IU]/mL (ref 0.30–0.70)

## 2017-01-01 LAB — COOXEMETRY PANEL
CARBOXYHEMOGLOBIN: 3.1 % — AB (ref 0.5–1.5)
Methemoglobin: 0.8 % (ref 0.0–1.5)
O2 Saturation: 57 %
Total hemoglobin: 7.7 g/dL — ABNORMAL LOW (ref 12.0–16.0)

## 2017-01-01 LAB — BASIC METABOLIC PANEL
ANION GAP: 10 (ref 5–15)
BUN: 19 mg/dL (ref 6–20)
CO2: 29 mmol/L (ref 22–32)
CREATININE: 1.73 mg/dL — AB (ref 0.44–1.00)
Calcium: 8.5 mg/dL — ABNORMAL LOW (ref 8.9–10.3)
Chloride: 99 mmol/L — ABNORMAL LOW (ref 101–111)
GFR calc Af Amer: 36 mL/min — ABNORMAL LOW (ref >60)
GFR calc non Af Amer: 31 mL/min — ABNORMAL LOW (ref >60)
GLUCOSE: 95 mg/dL (ref 65–99)
Potassium: 4 mmol/L (ref 3.5–5.1)
Sodium: 138 mmol/L (ref 135–145)

## 2017-01-01 LAB — PREPARE RBC (CROSSMATCH)

## 2017-01-01 LAB — MAGNESIUM: MAGNESIUM: 2.1 mg/dL (ref 1.7–2.4)

## 2017-01-01 LAB — PHOSPHORUS: Phosphorus: 1.3 mg/dL — ABNORMAL LOW (ref 2.5–4.6)

## 2017-01-01 MED ORDER — DEXTROSE 5 % IV SOLN
30.0000 mmol | Freq: Once | INTRAVENOUS | Status: AC
  Administered 2017-01-01: 30 mmol via INTRAVENOUS
  Filled 2017-01-01: qty 10

## 2017-01-01 MED ORDER — SODIUM CHLORIDE 0.9 % IV SOLN
Freq: Once | INTRAVENOUS | Status: AC
  Administered 2017-01-01: 14:00:00 via INTRAVENOUS

## 2017-01-01 MED ORDER — BISACODYL 5 MG PO TBEC
10.0000 mg | DELAYED_RELEASE_TABLET | Freq: Every day | ORAL | Status: DC
  Administered 2017-01-01 – 2017-01-20 (×12): 10 mg via ORAL
  Filled 2017-01-01 (×15): qty 2

## 2017-01-01 MED ORDER — MAGNESIUM SULFATE 4 GM/100ML IV SOLN
4.0000 g | Freq: Once | INTRAVENOUS | Status: AC
  Administered 2017-01-01: 4 g via INTRAVENOUS
  Filled 2017-01-01: qty 100

## 2017-01-01 NOTE — Progress Notes (Signed)
Subjective:  CRRT running well- minus another 6 liters from yesterday to today - afib is issue- an amio and cardizem but BP holding pretty steady    Objective Vital signs in last 24 hours: Vitals:   01/01/17 0458 01/01/17 0500 01/01/17 0600 01/01/17 0700  BP:  111/64 99/68 107/73  Pulse: (!) 133 (!) 116 (!) 126 (!) 124  Resp: 18 16 15  (!) 21  Temp:      TempSrc:      SpO2: 97% 96% 96% 100%  Weight:  (!) 178.3 kg (393 lb)    Height:       Weight change: 0.907 kg (2 lb)  Intake/Output Summary (Last 24 hours) at 01/01/17 0736 Last data filed at 01/01/17 0700  Gross per 24 hour  Intake           1721.7 ml  Output             8414 ml  Net          -6692.3 ml    Assessment/Plan: 61 year old black female with morbid obesity and a multitude of pulmonary issues who presents with recurrent congestive heart failure and acute on chronic renal failure 1.Renal- acute on chronic renal failure (recent creatinine on 2/3 was 1.97)- most likely due to cardiorenal syndrome/hemodynamics as urinalysis is fairly bland and renal ultrasound is normal. Failed milrinone in an intensive care unit setting so CRRT initiated 3/21. Good results so far- minus 13 liters 2. Hypertension/volume  - volume overloaded - UOP better on milrinone but not able to achieve diuresis- now CRRT initiated.  CVP still close to 20- continue 200 off per hour UF 3. Anemia  - due to frequent blood draws hospitalization as well as CKD. iron stores low-  giving feraheme- and have added ESA.  Probably is not helping her SOB- consider transfusion 4. Hyperkalemia-  resolved  5. Hypophosphatemia- agree with repletion   Paige Marshall A    Labs: Basic Metabolic Panel:  Recent Labs Lab 12/31/16 0300 12/31/16 0332 12/31/16 1632 01/01/17 0300  NA 138 138 138 138  138  K 3.8 3.8 3.8 4.0  4.0  CL 97* 97* 100* 99*  99*  CO2 30 29 29 29  29   GLUCOSE 94 98 125* 95  97  BUN 30* 30* 25* 19  19  CREATININE 2.06* 2.10* 1.84*  1.73*  1.70*  CALCIUM 8.5* 8.5* 8.5* 8.5*  8.5*  PHOS 2.1*  --  1.7* 1.3*  1.3*   Liver Function Tests:  Recent Labs Lab 12/31/16 0300 12/31/16 1632 01/01/17 0300  ALBUMIN 2.8* 2.9* 2.6*   No results for input(s): LIPASE, AMYLASE in the last 168 hours. No results for input(s): AMMONIA in the last 168 hours. CBC:  Recent Labs Lab 12/26/16 1502  12/28/16 0500  12/29/16 0444 12/30/16 0500 12/31/16 0332 01/01/17 0300  WBC 11.1*  < > 9.3  --  7.4 6.8 8.2 7.5  NEUTROABS 8.1*  --   --   --   --   --   --   --   HGB 9.5*  < > 7.9*  < > 7.6* 7.4* 7.6* 7.5*  HCT 34.6*  < > 28.1*  < > 27.1* 26.2* 26.6* 26.7*  MCV 90.8  < > 90.4  --  90.3 90.3 89.9 89.9  PLT 304  < > 207  --  145* 134* 146* 190  < > = values in this interval not displayed. Cardiac Enzymes: No results for input(s): CKTOTAL, CKMB, CKMBINDEX, TROPONINI  in the last 168 hours. CBG:  Recent Labs Lab 12/26/16 1645  GLUCAP 88    Iron Studies:  No results for input(s): IRON, TIBC, TRANSFERRIN, FERRITIN in the last 72 hours. Studies/Results: Dg Chest Port 1 View  Result Date: 12/30/2016 CLINICAL DATA:  Respiratory failure.  Hypoxia.  Tachycardia. EXAM: PORTABLE CHEST 1 VIEW COMPARISON:  One-view chest x-ray 12/29/2016 FINDINGS: Heart is enlarged. A right subclavian and left IJ catheter are stable. Edema and bilateral pleural effusions are not significantly changed. Bibasilar airspace disease is noted. IMPRESSION: 1. Stable findings of congestive heart failure. 2. Support apparatus is stable. Electronically Signed   By: San Morelle M.D.   On: 12/30/2016 08:50   Medications: Infusions: . amiodarone 30 mg/hr (12/31/16 2323)  . diltiazem (CARDIZEM) infusion 10 mg/hr (01/01/17 0700)  . heparin 2,350 Units/hr (01/01/17 0500)  . milrinone 0.25 mcg/kg/min (01/01/17 0158)  . dialysis replacement fluid (prismasate) 300 mL/hr at 12/31/16 1704  . dialysis replacement fluid (prismasate) 300 mL/hr at 12/31/16 1704  .  dialysate (PRISMASATE) 1,500 mL/hr at 01/01/17 0700    Scheduled Medications: . Chlorhexidine Gluconate Cloth  6 each Topical Daily  . darbepoetin (ARANESP) injection - NON-DIALYSIS  150 mcg Subcutaneous Q Tue-1800  . ferrous sulfate  325 mg Oral TID WC  . ferumoxytol  510 mg Intravenous Weekly  . mouth rinse  15 mL Mouth Rinse BID  . oxybutynin  5 mg Oral BID  . senna-docusate  2 tablet Oral QHS  . sodium chloride flush  10-40 mL Intracatheter Q12H  . sodium chloride flush  3 mL Intravenous Q12H  . sodium phosphate  Dextrose 5% IVPB  30 mmol Intravenous Once    have reviewed scheduled and prn medications.  Physical Exam: General: alert-  Heart:tachy Lungs: CBS bilat Abdomen: soft,non tender Extremities: pitting edema Dialysis Access: left IJ vascath placed 3/21   01/01/2017,7:36 AM  LOS: 6 days

## 2017-01-01 NOTE — Progress Notes (Signed)
Advanced Heart Failure Rounding Note  PCP: EDWARDS, Milford Cage, NP  Primary Cardiologist: Dr. Gwenlyn Found   Subjective:    Admitted with volume overload, RV failure. Milrinone 0.47mcg started 3/19. Started CRRT 12/29/16.   Remains on CRT.  Breathing better but still requiring BIPAP at times. No CP or palpitations Remains in AF with RVR despite amio and diltiazem. Feels constipated  Pulling well with CRT but weight apparently up 2 pounds today. Co-ox 57% on milrinone 0.25   Objective:   Weight Range: (!) 178.3 kg (393 lb) Body mass index is 60.64 kg/m.   Vital Signs:   Temp:  [97.6 F (36.4 C)-98.1 F (36.7 C)] 97.8 F (36.6 C) (03/24 1135) Pulse Rate:  [25-146] 132 (03/24 1300) Resp:  [15-29] 28 (03/24 1300) BP: (93-140)/(55-105) 108/94 (03/24 1300) SpO2:  [84 %-100 %] 97 % (03/24 1300) FiO2 (%):  [40 %-50 %] 40 % (03/24 0805) Weight:  [178.3 kg (393 lb)] 178.3 kg (393 lb) (03/24 0500) Last BM Date: 12/30/16  Weight change: Filed Weights   12/30/16 0400 12/31/16 0600 01/01/17 0500  Weight: (!) 181.4 kg (400 lb) (!) 177.4 kg (391 lb) (!) 178.3 kg (393 lb)    Intake/Output:   Intake/Output Summary (Last 24 hours) at 01/01/17 1317 Last data filed at 01/01/17 1300  Gross per 24 hour  Intake           1795.9 ml  Output             8385 ml  Net          -6589.1 ml     Physical Exam: CVP 17-18 General: Obese female on Bipap. Otherwise comfortable. Talking in full sentences HEENT: normal, atraumatic. anicteric Neck: supple. Hard to assess JVP due to size, appears elevated. Carotids 2+ bilat; no bruits.LIJ trialysis Cor: PMI nonpalpable. Tachycardic, IRR, IRR 2/6 TR no s3 Lungs: Diminished throughout on Bipap anteriorly. No wheezing Abdomen: Morbidly obese, soft, nontender.distension improving Unable to assess for hepatosplenomegaly due to size. No bruits or masses. Good BS Extremities: no cyanosis, clubbing, rash. 2+ edema. No ulcers Warm Neuro: alert & orientedx3,  cranial nerves grossly intact. moves all 4 extremities w/o difficulty.   Telemetry: Afib RVR with rates in the 120-140's. Several episodes NSVT Personally reviewed    Labs: CBC  Recent Labs  12/31/16 0332 01/01/17 0300  WBC 8.2 7.5  HGB 7.6* 7.5*  HCT 26.6* 26.7*  MCV 89.9 89.9  PLT 146* 937   Basic Metabolic Panel  Recent Labs  12/31/16 0332 12/31/16 1632 01/01/17 0300  NA 138 138 138  138  K 3.8 3.8 4.0  4.0  CL 97* 100* 99*  99*  CO2 29 29 29  29   GLUCOSE 98 125* 95  97  BUN 30* 25* 19  19  CREATININE 2.10* 1.84* 1.73*  1.70*  CALCIUM 8.5* 8.5* 8.5*  8.5*  MG 2.2  --  2.1  PHOS  --  1.7* 1.3*  1.3*    BNP: BNP (last 3 results)  Recent Labs  09/18/16 1126 11/07/16 1633 12/26/16 1643  BNP 991.0* 490.4* 1,116.0*    Medications:     Scheduled Medications: . Chlorhexidine Gluconate Cloth  6 each Topical Daily  . darbepoetin (ARANESP) injection - NON-DIALYSIS  150 mcg Subcutaneous Q Tue-1800  . ferrous sulfate  325 mg Oral TID WC  . ferumoxytol  510 mg Intravenous Weekly  . mouth rinse  15 mL Mouth Rinse BID  . oxybutynin  5 mg  Oral BID  . senna-docusate  2 tablet Oral QHS  . sodium chloride flush  10-40 mL Intracatheter Q12H  . sodium chloride flush  3 mL Intravenous Q12H    Infusions: . amiodarone 30 mg/hr (01/01/17 1000)  . diltiazem (CARDIZEM) infusion 10 mg/hr (01/01/17 0700)  . heparin 2,350 Units/hr (01/01/17 0500)  . milrinone 0.25 mcg/kg/min (01/01/17 1000)  . dialysis replacement fluid (prismasate) 300 mL/hr at 01/01/17 1043  . dialysis replacement fluid (prismasate) 300 mL/hr at 01/01/17 1043  . dialysate (PRISMASATE) 1,500 mL/hr at 01/01/17 1043    PRN Medications: sodium chloride, acetaminophen, ALPRAZolam, heparin, ondansetron (ZOFRAN) IV, oxyCODONE, polyethylene glycol, promethazine, sodium chloride, sodium chloride, sodium chloride flush, sodium chloride flush   Assessment/Plan   1. Acute on chronic diastolic  CHF 2. Paroxysmal atrial fibrillation on Coumadin 3. Acute on chronic kidney disease stage IV 4. Obesity hypoventilation syndrome.  5. Hyperkalemia 6. Acute RV failure/cor pulmonale 7. OSA, noncompliant with CPAP 8. Acute on chronic respiratory failure 9. Anemia 10. NSVT  She is down 20+ pounds with CVVHD and IV diuresis. Respiratory status improving but still not as good as I would have hoped it would be at this point. Failed wean to [place] this am. Will continue BIPAP as needed.    On milrinone for RV failure. Co-ox improved. Anemia likely contributing. Will give 2uints RBCS slowly today,   Remains in AF with RVR despite amio and diltiazem. Off coumadin and now on heparin. (had continuous coverage). I discussed possible DC-CV with CCM earlier this week and we both feel that if we sedated her for DC-CV she would need to be intubated. Thus will continue to hold off until respiratory status more stable   Continues with runs of NSVT. Supping electrolytes. Keep K > 4.0 Mg > 2.0. Continue amio   The patient is critically ill with multiple organ systems failure and requires high complexity decision making for assessment and support, frequent evaluation and titration of therapies, application of advanced monitoring technologies and extensive interpretation of multiple databases.   Critical Care Time devoted to patient care services described in this note is 35 Minutes. This time reflects time of care of this signee, Dr. Pierre Bali who personally examined the patient and determined the plan of care. This critical care time does not reflect procedure time, teaching time or supervisory time of our PA/NP/resident but could involve care discussion and coordination time.  .   Length of Stay: 6   Glori Bickers, MD  01/01/2017, 1:17 PM  Advanced Heart Failure Team  Pager [phone] M-F 7am-4pm.  Please contact [facility] for night-coverage after hours (4p -7a ) and weekends on  [email or web address]  Agree with above.

## 2017-01-01 NOTE — Progress Notes (Signed)
PULMONARY / CRITICAL CARE MEDICINE   Name: Paige Marshall MRN: [id number] DOB: 09/14/1956    ADMISSION DATE:  12/26/2016 CONSULTATION DATE:  12/30/16  REFERRING MD:  Dr. Tommy Medal   CHIEF COMPLAINT:  Shortness of breath   HISTORY OF PRESENT ILLNESS:  61 y/o F admitted from rehab facility (resident since November 2017) on 3/18 with complaints of shortness of breath & lower extremity swelling.   The patient has a medical history of morbid obesity / wheelchair bound, untreated OHS, chronic combined systolic / diastolic CHF, NICM, AF on coumadin, HTN, 3L O2 dependent, pulmonary hypertension, former smoker (11 pk years) with COPD & CKD III.  The patient suffered a left fibular fracture in late 2017.  She was recently admitted from 1/28-11/13/16 for acute CHF exacerbation.  At that time, her discharge weight was 370lbs.    She returned from the rehab facility on 3/18 with increased SOB and lower extremity swelling.  She if followed by Dr. Gwenlyn Found for cardiology and was recently switched to lasix and metolazone.  Lasix was discontinued at the SNF due to rising sr cr.  Her admit weight was 411lbs.  She was admitted by IMTS for decompensated CHF.  She developed worsening AKI with concern for cardio-renal syndrome.  An HD catheter was inserted 3/21 and CVVHD was initiated.   She remains on milrinone, amiodarone and heparin gtt's.  Rate control has been difficult to achieve.  Over the past 24 hours, she is net negative 3.5L.  She developed respiratory distress am of 3/22 and BiPAP was initiated.    PCCM consulted for evaluation.    SUBJECTIVE:  No major issues Tolerating CVVH Was maintained on BiPAP last night. Came off of it AM without any issues. Feels well with no respiratory distress  VITAL SIGNS: BP (!) 127/93   Pulse (!) 123   Temp 97.8 F (36.6 C) (Axillary)   Resp (!) 23   Ht 5' 7.5" (1.715 m)   Wt (!) 393 lb (178.3 kg)   LMP 09/07/2011   SpO2 90%   BMI 60.64 kg/m    HEMODYNAMICS: CVP:  [18 mmHg-22 mmHg] 18 mmHg  VENTILATOR SETTINGS: FiO2 (%):  [40 %-50 %] 40 %  INTAKE / OUTPUT: I/O last 3 completed shifts: In: 2492.1 [P.O.:240; I.V.:2166.1; IV Piggyback:86] Out: 62229 [Urine:425; NLGXQ:11941]  PHYSICAL EXAMINATION: Gen:      No acute distress, morbid obesity HEENT:  EOMI, sclera anicteric Neck:     No masses; no thyromegaly Lungs:    Clear to auscultation bilaterally; normal respiratory effort CV:         Regular rate and rhythm; no murmurs Abd:      + bowel sounds; soft, non-tender; no palpable masses, no distension Ext:    No edema; adequate peripheral perfusion Skin:      Warm and dry; no rash Neuro: alert and oriented x 3   LABS:  BMET  Recent Labs Lab 12/31/16 0332 12/31/16 1632 01/01/17 0300  NA 138 138 138  138  K 3.8 3.8 4.0  4.0  CL 97* 100* 99*  99*  CO2 29 29 29  29   BUN 30* 25* 19  19  CREATININE 2.10* 1.84* 1.73*  1.70*  GLUCOSE 98 125* 95  97    Electrolytes  Recent Labs Lab 12/30/16 0500  12/31/16 0300 12/31/16 0332 12/31/16 1632 01/01/17 0300  CALCIUM 8.5*  < > 8.5* 8.5* 8.5* 8.5*  8.5*  MG 2.0  --   --  2.2  --  2.1  PHOS 3.0  < > 2.1*  --  1.7* 1.3*  1.3*  < > = values in this interval not displayed.  CBC  Recent Labs Lab 12/30/16 0500 12/31/16 0332 01/01/17 0300  WBC 6.8 8.2 7.5  HGB 7.4* 7.6* 7.5*  HCT 26.2* 26.6* 26.7*  PLT 134* 146* 190    Coag's  Recent Labs Lab 12/29/16 0444 12/30/16 0500 12/31/16 0332  INR 1.92 1.43 1.32    Sepsis Markers No results for input(s): LATICACIDVEN, PROCALCITON, O2SATVEN in the last 168 hours.  ABG  Recent Labs Lab 12/30/16 0922 12/30/16 0938  PHART 7.406 7.414  PCO2ART 50.5* 47.8  PO2ART 31.0* 48.0*    Liver Enzymes  Recent Labs Lab 12/31/16 0300 12/31/16 1632 01/01/17 0300  ALBUMIN 2.8* 2.9* 2.6*    Cardiac Enzymes No results for input(s): TROPONINI, PROBNP in the last 168 hours.  Glucose  Recent Labs Lab  12/26/16 1645  GLUCAP 88    Imaging No results found.   STUDIES:  US Renal 3/18 >> normal appearance of kidneys, moderate lower abdominal ascites CXR 3/22>> unchanged edema, bilateral effusions. I have reviewed the images personally.  CULTURES:   ANTIBIOTICS:   SIGNIFICANT EVENTS: 3/18  Admit with SOB, increased LE swelling > decompensated CHF  3/21  CVVHD initiated  3/22  PCCM consulted for respiratory distress   LINES/TUBES: R [place] TLC 3/19 >>  L IJ HD Cath 3/21 >>   DISCUSSION: 61 y/o F admitted with decompensated CHF, volume overload, AKI with concern for cardiorenal syndrome.  CVVHD in progress.  Tolerating NIV/Bipap.   ASSESSMENT / PLAN:  PULMONARY A: Acute on Chronic Hypoxemic Hypercapneic Respiratory Failure 2/2 Volume overload/Pulmonary edema + CHF exacerbation pEF + OSA + OHS + Pulm HTN Pt with h/o asthma/copd but it is not in exacerbation P:   - Continue Bipap at night and as needed during the day - Monitor for intubation risk  Cont NIV/Bipap 12/5 50%. Tolerating bipap/NIV. Better today.  I will keep NIV for the most part over the weekend and have breaks for  meals.  I hope we do not have to intubate.  She may need cardioversion for rapid afib but she will most likely need to be intubated for the procedure.  Holding off on cardioversion for now and reassess next week.  Continue CVVH  Wean O2 for sats > 90% Trend CXR  Volume removal per CVVH   CARDIOVASCULAR A:  AF with RVR - on coumadin  Acute on Chronic Diastolic CHF with Volume Overload Acute RV Failure / Cor Pulmonale  HTN  P:  Continue amio, cardizem, milrinone and heparin gtt Follow venous sats (57 today) May need cardioversion but we are holding off for now until resp straus improves. BP has been stable.  RENAL A:   Acute on Chronic Renal Failure - suspect cardiorenal syndrome, renal US normal Hyperkalemia P:   Continue volume removal via CVVH Replete lytes Nephrology  following.   GASTROINTESTINAL A:   Morbid Obesity  P:   PO diet Heart healthy, low sodium diet as tolerated otherwise Fluid restricton  HEMATOLOGIC A:   Anemia - suspect in setting of CKD + frequent phlebotomy  Chronic Anticoagulation P:  Follow CBC Continue heparin anticoagulation  INFECTIOUS A:   No acute infectious process  P:   Observe off antibiotics  ENDOCRINE A:   No acute issues  P:   Follow glucose on BMP  NEUROLOGIC A:   Anxiety  P:   Get OOB, mobilize  PRM xanax for anxiety. Minimize sedating meds  FAMILY  - Updates: Patient updated at bedside 3/24 - Inter-disciplinary family meet or Palliative Care meeting due by:  3/29  The patient is critically ill with multiple organ system failure and requires high complexity decision making for assessment and support, frequent evaluation and titration of therapies, advanced monitoring, review of radiographic studies and interpretation of complex data.   Critical Care Time devoted to patient care services, exclusive of separately billable procedures, described in this note is 35 minutes.   Marshell Garfinkel MD [facility] Pager [phone] If no answer or after 3pm call: [phone] 01/01/2017, 9:56 AM

## 2017-01-01 NOTE — Progress Notes (Signed)
[facility] Progress Note Patient Name: Paige Marshall DOB: 1956/02/17 MRN: [id number]   Date of Service  01/01/2017  HPI/Events of Note  hypophosphatemia  eICU Interventions  Phos replaced     Intervention Category Intermediate Interventions: Electrolyte abnormality - evaluation and management  DETERDING,ELIZABETH 01/01/2017, 4:00 AM

## 2017-01-01 NOTE — Progress Notes (Signed)
Pt taken off bipap and placed on 6L HFNC at this time due to "clogged/stuffed" nose for added flow. VS within normal limits (120, 100%, R 18). Pt in no distress. RN at bedside. RT will continue to monitor.

## 2017-01-01 NOTE — Progress Notes (Signed)
ANTICOAGULATION CONSULT NOTE - Follow Up Consult  Pharmacy Consult for Heparin Indication: atrial fibrillation  Allergies  Allergen Reactions  . Penicillins Hives, Itching, Swelling and Other (See Comments)    Tongue swelling Has patient had a PCN reaction causing immediate rash, facial/tongue/throat swelling, SOB or lightheadedness with hypotension: Yes  Clarified with the patient her penicillin allergy. Pt has tolerated cefepime in the past. Pt also states that she can take amoxicillin.    . Citrus Hives    Patient Measurements: Height: 5' 7.5" (171.5 cm) Weight: (!) 393 lb (178.3 kg) IBW/kg (Calculated) : 62.75 Heparin Dosing Weight: 111kg  Vital Signs: Temp: 97.8 F (36.6 C) (03/24 0814) Temp Source: Axillary (03/24 0814) BP: 116/105 (03/24 0814) Pulse Rate: 120 (03/24 0814)  Labs:  Recent Labs  12/30/16 0500  12/31/16 0332 12/31/16 0723 12/31/16 1632 01/01/17 0300 01/01/17 0445  HGB 7.4*  --  7.6*  --   --  7.5*  --   HCT 26.2*  --  26.6*  --   --  26.7*  --   PLT 134*  --  146*  --   --  190  --   LABPROT 17.6*  --  16.5*  --   --   --   --   INR 1.43  --  1.32  --   --   --   --   HEPARINUNFRC 0.57  --   --  0.61  --   --  0.41  CREATININE 2.73*  < > 2.10*  --  1.84* 1.73*  1.70*  --   < > = values in this interval not displayed.  Estimated Creatinine Clearance: 61 mL/min (A) (by C-G formula based on SCr of 1.73 mg/dL (H)).   Medications:  Heparin @ 2350 units/hr  Assessment: 60yof on coumadin pta for afib, admitted with volume overload and renal failure. INR 2.84 on admit and coumadin initially resumed (received one dose of 5mg ). On 3/20, coumadin held, and she received 5mg  vitamin k to reverse INR anticipating need for CRRT.  Heparin level continues to be at goal this morning at 0.4. Hgb stable but low at 7.5. No bleeding has been noted.  Goal of Therapy:  Heparin level 0.3-0.7 units/ml Monitor platelets by anticoagulation protocol: Yes    Plan:  1) Continue heparin at 2350 units/hr 2) Daily heparin level and CBC  Erin Hearing PharmD., BCPS Clinical Pharmacist Pager [phone] 01/01/2017 8:58 AM

## 2017-01-02 ENCOUNTER — Inpatient Hospital Stay (HOSPITAL_COMMUNITY): Payer: Medicare Other

## 2017-01-02 DIAGNOSIS — I2721 Secondary pulmonary arterial hypertension: Secondary | ICD-10-CM

## 2017-01-02 LAB — RENAL FUNCTION PANEL
ALBUMIN: 3.1 g/dL — AB (ref 3.5–5.0)
ALBUMIN: 3 g/dL — AB (ref 3.5–5.0)
ANION GAP: 10 (ref 5–15)
Anion gap: 11 (ref 5–15)
BUN: 14 mg/dL (ref 6–20)
BUN: 13 mg/dL (ref 6–20)
CALCIUM: 8.5 mg/dL — AB (ref 8.9–10.3)
CHLORIDE: 98 mmol/L — AB (ref 101–111)
CO2: 27 mmol/L (ref 22–32)
CO2: 27 mmol/L (ref 22–32)
CREATININE: 1.75 mg/dL — AB (ref 0.44–1.00)
Calcium: 8.7 mg/dL — ABNORMAL LOW (ref 8.9–10.3)
Chloride: 99 mmol/L — ABNORMAL LOW (ref 101–111)
Creatinine, Ser: 1.71 mg/dL — ABNORMAL HIGH (ref 0.44–1.00)
GFR calc Af Amer: 36 mL/min — ABNORMAL LOW (ref >60)
GFR calc non Af Amer: 32 mL/min — ABNORMAL LOW (ref >60)
GFR, EST AFRICAN AMERICAN: 37 mL/min — AB (ref >60)
GFR, EST NON AFRICAN AMERICAN: 31 mL/min — AB (ref >60)
GLUCOSE: 101 mg/dL — AB (ref 65–99)
Glucose, Bld: 130 mg/dL — ABNORMAL HIGH (ref 65–99)
PHOSPHORUS: 3.3 mg/dL (ref 2.5–4.6)
POTASSIUM: 3.7 mmol/L (ref 3.5–5.1)
POTASSIUM: 4.1 mmol/L (ref 3.5–5.1)
Phosphorus: 1.3 mg/dL — ABNORMAL LOW (ref 2.5–4.6)
SODIUM: 136 mmol/L (ref 135–145)
Sodium: 136 mmol/L (ref 135–145)

## 2017-01-02 LAB — TYPE AND SCREEN
ABO/RH(D): A POS
ANTIBODY SCREEN: NEGATIVE
Unit division: 0
Unit division: 0

## 2017-01-02 LAB — COOXEMETRY PANEL
CARBOXYHEMOGLOBIN: 2.4 % — AB (ref 0.5–1.5)
METHEMOGLOBIN: 1.2 % (ref 0.0–1.5)
O2 SAT: 52.8 %
Total hemoglobin: 8.4 g/dL — ABNORMAL LOW (ref 12.0–16.0)

## 2017-01-02 LAB — PHOSPHORUS: Phosphorus: 1.3 mg/dL — ABNORMAL LOW (ref 2.5–4.6)

## 2017-01-02 LAB — HEPARIN LEVEL (UNFRACTIONATED): Heparin Unfractionated: 0.48 IU/mL (ref 0.30–0.70)

## 2017-01-02 LAB — CBC
HEMATOCRIT: 27.6 % — AB (ref 36.0–46.0)
Hemoglobin: 8.3 g/dL — ABNORMAL LOW (ref 12.0–15.0)
MCH: 26.7 pg (ref 26.0–34.0)
MCHC: 30.1 g/dL (ref 30.0–36.0)
MCV: 88.7 fL (ref 78.0–100.0)
PLATELETS: 113 10*3/uL — AB (ref 150–400)
RBC: 3.11 MIL/uL — ABNORMAL LOW (ref 3.87–5.11)
RDW: 25.9 % — AB (ref 11.5–15.5)
WBC: 8.5 10*3/uL (ref 4.0–10.5)

## 2017-01-02 LAB — BPAM RBC
BLOOD PRODUCT EXPIRATION DATE: 201804132359
BLOOD PRODUCT EXPIRATION DATE: 201804142359
ISSUE DATE / TIME: 201803241400
ISSUE DATE / TIME: 201803241631
Unit Type and Rh: 6200
Unit Type and Rh: 6200

## 2017-01-02 LAB — MAGNESIUM: MAGNESIUM: 2.7 mg/dL — AB (ref 1.7–2.4)

## 2017-01-02 MED ORDER — FERROUS SULFATE 325 (65 FE) MG PO TABS
325.0000 mg | ORAL_TABLET | Freq: Three times a day (TID) | ORAL | Status: DC
  Administered 2017-01-02 – 2017-01-09 (×23): 325 mg via ORAL
  Filled 2017-01-02 (×22): qty 1

## 2017-01-02 MED ORDER — SODIUM PHOSPHATES 45 MMOLE/15ML IV SOLN
30.0000 mmol | Freq: Two times a day (BID) | INTRAVENOUS | Status: AC
  Administered 2017-01-02 – 2017-01-03 (×4): 30 mmol via INTRAVENOUS
  Filled 2017-01-02 (×7): qty 10

## 2017-01-02 NOTE — Progress Notes (Signed)
Advanced Heart Failure Rounding Note  PCP: EDWARDS, Milford Cage, NP  Primary Cardiologist: Dr. Gwenlyn Found   Subjective:    Admitted with volume overload, RV failure. Milrinone 0.42mcg started 3/19. Started CRRT 12/29/16.   Remains on CRT.  Pulling 250-300/hr. Breathing better. BP trending down slowly. Weight down to 363. (50 pounds down). CVP 19  Remains on milrinone 0.25. Co-ox 53%  Now off Bipap and on 10L [place]. Still in AF with RVR 120s   Got 2u PRBCs on 3/24   Objective:   Weight Range: (!) 164.7 kg (363 lb) Body mass index is 56.01 kg/m.   Vital Signs:   Temp:  [97.1 F (36.2 C)-98.8 F (37.1 C)] 98 F (36.7 C) (03/25 1218) Pulse Rate:  [25-145] 129 (03/25 1300) Resp:  [15-28] 24 (03/25 1300) BP: (98-126)/(63-106) 98/71 (03/25 1300) SpO2:  [86 %-100 %] 93 % (03/25 1300) FiO2 (%):  [50 %] 50 % (03/25 0400) Weight:  [164.7 kg (363 lb)] 164.7 kg (363 lb) (03/25 0325) Last BM Date: 12/30/16  Weight change: Filed Weights   12/31/16 0600 01/01/17 0500 01/02/17 0325  Weight: (!) 177.4 kg (391 lb) (!) 178.3 kg (393 lb) (!) 164.7 kg (363 lb)    Intake/Output:   Intake/Output Summary (Last 24 hours) at 01/02/17 1337 Last data filed at 01/02/17 1300  Gross per 24 hour  Intake           2715.8 ml  Output             7323 ml  Net          -4607.2 ml     Physical Exam: CVP 18-19 General: Obese female lying in bed, On 10L  Otherwise comfortable. Talking in full sentences HEENT: normal, atraumatic. anicteric Neck: supple. Hard to assess JVP due to size, appears elevated. Carotids 2+ bilat; no bruits. LIJ trialysis Cor: PMI nonpalpable. Tachycardic, IRR iRR 2/6 TR no s3 Lungs: Diminished throughout on Bipap anteriorly. No wheezing or rhonchi  Abdomen: Morbidly obese, soft, nontender. + distended Unable to assess for hepatosplenomegaly due to size. No bruits or masses. Good BS Extremities: no cyanosis, clubbing, rash. 2+ edema into thighs No ulcers Warm Neuro: alert &  orientedx3, cranial nerves grossly intact. moves all 4 extremities w/o difficulty.   Telemetry: Afib RVR with rates in the 115-125 . Several brief episodes NSVT Personally reviewed     Labs: CBC  Recent Labs  01/01/17 0300 01/02/17 0326  WBC 7.5 8.5  HGB 7.5* 8.3*  HCT 26.7* 27.6*  MCV 89.9 88.7  PLT 190 431*   Basic Metabolic Panel  Recent Labs  01/01/17 0300 01/01/17 1800 01/02/17 0326  NA 138  138 136 136  K 4.0  4.0 3.8 3.7  CL 99*  99* 99* 98*  CO2 29  29 27 27   GLUCOSE 95  97 114* 101*  BUN 19  19 16 14   CREATININE 1.73*  1.70* 1.78* 1.75*  CALCIUM 8.5*  8.5* 8.7* 8.7*  MG 2.1  --  2.7*  PHOS 1.3*  1.3* 1.7* 1.3*  1.3*    BNP: BNP (last 3 results)  Recent Labs  09/18/16 1126 11/07/16 1633 12/26/16 1643  BNP 991.0* 490.4* 1,116.0*    Medications:     Scheduled Medications: . bisacodyl  10 mg Oral Daily  . Chlorhexidine Gluconate Cloth  6 each Topical Daily  . darbepoetin (ARANESP) injection - NON-DIALYSIS  150 mcg Subcutaneous Q Tue-1800  . ferrous sulfate  325 mg Oral  TID WC  . ferumoxytol  510 mg Intravenous Weekly  . mouth rinse  15 mL Mouth Rinse BID  . oxybutynin  5 mg Oral BID  . senna-docusate  2 tablet Oral QHS  . sodium chloride flush  10-40 mL Intracatheter Q12H  . sodium chloride flush  3 mL Intravenous Q12H  . sodium phosphate  Dextrose 5% IVPB  30 mmol Intravenous BID    Infusions: . amiodarone 30 mg/hr (01/02/17 1130)  . diltiazem (CARDIZEM) infusion 10 mg/hr (01/02/17 1130)  . heparin 2,350 Units/hr (01/02/17 1130)  . milrinone 0.25 mcg/kg/min (01/02/17 1130)  . dialysis replacement fluid (prismasate) 300 mL/hr at 01/02/17 0427  . dialysis replacement fluid (prismasate) 300 mL/hr at 01/02/17 0430  . dialysate (PRISMASATE) 1,500 mL/hr at 01/02/17 1045    PRN Medications: sodium chloride, acetaminophen, ALPRAZolam, heparin, ondansetron (ZOFRAN) IV, oxyCODONE, polyethylene glycol, promethazine, sodium chloride,  sodium chloride, sodium chloride flush, sodium chloride flush   Assessment/Plan   1. Acute on chronic diastolic CHF 2. Paroxysmal atrial fibrillation on Coumadin 3. Acute on chronic kidney disease stage IV 4. Obesity hypoventilation syndrome.  5. Hyperkalemia 6. Acute RV failure/cor pulmonale 7. OSA, noncompliant with CPAP 8. Acute on chronic respiratory failure 9. Anemia 10. NSVT  She is down 50+ pounds with CVVHD and IV diuresis. Respiratory status improving but still tenuous. Off BIPAP and on high flow [place]. Will continue to pull fluid. May need to slow rate if BP dropping.     On milrinone for RV failure. Co-ox remains marginal. May need RHC prior to d/c once volume status optimized   Got 2u RBCs 3/24. hgb 7.5-> 8.3. No clear source of bleeding. Will follow. Continue PPI.   Remains in AF with RVR despite amio and diltiazem. Off coumadin and now on heparin. (had continuous coverage). Respiratory status improving. May be able to consider DC-CV in next day otr two,   Continues with runs of NSVT. Supping electrolytes. Keep K > 4.0 Mg > 2.0. Continue amio. Mag 2.7 today. Phos replaced   The patient is critically ill with multiple organ systems failure and requires high complexity decision making for assessment and support, frequent evaluation and titration of therapies, application of advanced monitoring technologies and extensive interpretation of multiple databases.   Critical Care Time devoted to patient care services described in this note is 35 Minutes. This time reflects time of care of this signee, Dr. Pierre Bali who personally examined the patient and determined the plan of care. This critical care time does not reflect procedure time, teaching time or supervisory time of our PA/NP/resident but could involve care discussion and coordination time.  .   Length of Stay: 7   Glori Bickers, MD  01/02/2017, 1:37 PM  Advanced Heart Failure Team  Pager [phone] M-F  7am-4pm.  Please contact [facility] for night-coverage after hours (4p -7a ) and weekends on [email or web address]  Agree with above.

## 2017-01-02 NOTE — Progress Notes (Signed)
ANTICOAGULATION CONSULT NOTE - Follow Up Consult  Pharmacy Consult for Heparin Indication: atrial fibrillation  Allergies  Allergen Reactions  . Penicillins Hives, Itching, Swelling and Other (See Comments)    Tongue swelling Has patient had a PCN reaction causing immediate rash, facial/tongue/throat swelling, SOB or lightheadedness with hypotension: Yes  Clarified with the patient her penicillin allergy. Pt has tolerated cefepime in the past. Pt also states that she can take amoxicillin.    . Citrus Hives    Patient Measurements: Height: 5' 7.5" (171.5 cm) Weight: (!) 363 lb (164.7 kg) IBW/kg (Calculated) : 62.75 Heparin Dosing Weight: 111kg  Vital Signs: Temp: 97.5 F (36.4 C) (03/25 0758) Temp Source: Oral (03/25 0758) BP: 105/64 (03/25 0900) Pulse Rate: 128 (03/25 0900)  Labs:  Recent Labs  12/31/16 0332 12/31/16 0723  01/01/17 0300 01/01/17 0445 01/01/17 1800 01/02/17 0326  HGB 7.6*  --   --  7.5*  --   --  8.3*  HCT 26.6*  --   --  26.7*  --   --  27.6*  PLT 146*  --   --  190  --   --  113*  LABPROT 16.5*  --   --   --   --   --   --   INR 1.32  --   --   --   --   --   --   HEPARINUNFRC  --  0.61  --   --  0.41  --  0.48  CREATININE 2.10*  --   < > 1.73*  1.70*  --  1.78* 1.75*  < > = values in this interval not displayed.  Estimated Creatinine Clearance: 57.3 mL/min (A) (by C-G formula based on SCr of 1.75 mg/dL (H)).   Medications:  Heparin @ 2350 units/hr  Assessment: 60yof on coumadin pta for afib, admitted with volume overload and renal failure. INR 2.84 on admit and coumadin initially resumed (received one dose of 5mg ).  Heparin level continues to be at goal this morning at 0.4. Hgb improved to 8.3 after 2 units yesterday. No bleeding has been noted.  Goal of Therapy:  Heparin level 0.3-0.7 units/ml Monitor platelets by anticoagulation protocol: Yes   Plan:  1) Continue heparin at 2350 units/hr 2) Daily heparin level and CBC  Erin Hearing PharmD., BCPS Clinical Pharmacist Pager [phone] 01/02/2017 9:06 AM

## 2017-01-02 NOTE — Progress Notes (Addendum)
PULMONARY / CRITICAL CARE MEDICINE   Name: Paige Marshall MRN: [id number] DOB: 1955/11/06    ADMISSION DATE:  12/26/2016 CONSULTATION DATE:  12/30/16  REFERRING MD:  Dr. Tommy Medal   CHIEF COMPLAINT:  Shortness of breath   HISTORY OF PRESENT ILLNESS:  61 y/o F admitted from rehab facility (resident since November 2017) on 3/18 with complaints of shortness of breath & lower extremity swelling.   The patient has a medical history of morbid obesity / wheelchair bound, untreated OHS, chronic combined systolic / diastolic CHF, NICM, AF on coumadin, HTN, 3L O2 dependent, pulmonary hypertension, former smoker (11 pk years) with COPD & CKD III.  The patient suffered a left fibular fracture in late 2017.  She was recently admitted from 1/28-11/13/16 for acute CHF exacerbation.  At that time, her discharge weight was 370lbs.    She returned from the rehab facility on 3/18 with increased SOB and lower extremity swelling.  She if followed by Dr. Gwenlyn Found for cardiology and was recently switched to lasix and metolazone.  Lasix was discontinued at the SNF due to rising sr cr.  Her admit weight was 411lbs.  She was admitted by IMTS for decompensated CHF.  She developed worsening AKI with concern for cardio-renal syndrome.  An HD catheter was inserted 3/21 and CVVHD was initiated.   She remains on milrinone, amiodarone and heparin gtt's.  Rate control has been difficult to achieve.  Over the past 24 hours, she is net negative 3.5L.  She developed respiratory distress am of 3/22 and BiPAP was initiated.    PCCM consulted for evaluation.   SUBJECTIVE:  No major issues, tolerating CVVH with volume overload Had to be maintained on BiPAP yesterday. Now on nasal cannula  VITAL SIGNS: BP 105/64   Pulse (!) 128   Temp 97.5 F (36.4 C) (Oral)   Resp (!) 24   Ht 5' 7.5" (1.715 m)   Wt (!) 363 lb (164.7 kg)   LMP 09/07/2011   SpO2 (!) 85%   BMI 56.01 kg/m   HEMODYNAMICS: CVP:  [17 mmHg-20 mmHg] 20  mmHg  VENTILATOR SETTINGS: FiO2 (%):  [50 %] 50 %  INTAKE / OUTPUT: I/O last 3 completed shifts: In: 3340.5 [P.O.:150; I.V.:2334.5; Blood:670; IV Piggyback:186] Out: [id number] [Urine:145; FYBOF:75102]  PHYSICAL EXAMINATION: Gen:      No acute distress, morbid obesity HEENT:  EOMI, sclera anicteric Neck:     No masses; no thyromegaly Lungs:    Clear to auscultation bilaterally; normal respiratory effort CV:         Regular rate and rhythm; no murmurs Abd:      + bowel sounds; soft, non-tender; no palpable masses, no distension Ext:    1-2 + edema; adequate peripheral perfusion Skin:      Warm and dry; no rash Neuro: alert and oriented x 3  LABS:  BMET  Recent Labs Lab 01/01/17 0300 01/01/17 1800 01/02/17 0326  NA 138  138 136 136  K 4.0  4.0 3.8 3.7  CL 99*  99* 99* 98*  CO2 29  29 27 27   BUN 19  19 16 14   CREATININE 1.73*  1.70* 1.78* 1.75*  GLUCOSE 95  97 114* 101*    Electrolytes  Recent Labs Lab 12/31/16 0332  01/01/17 0300 01/01/17 1800 01/02/17 0326  CALCIUM 8.5*  < > 8.5*  8.5* 8.7* 8.7*  MG 2.2  --  2.1  --  2.7*  PHOS  --   < > 1.3*  1.3* 1.7* 1.3*  1.3*  < > = values in this interval not displayed.  CBC  Recent Labs Lab 12/31/16 0332 01/01/17 0300 01/02/17 0326  WBC 8.2 7.5 8.5  HGB 7.6* 7.5* 8.3*  HCT 26.6* 26.7* 27.6*  PLT 146* 190 113*    Coag's  Recent Labs Lab 12/29/16 0444 12/30/16 0500 12/31/16 0332  INR 1.92 1.43 1.32    Sepsis Markers No results for input(s): LATICACIDVEN, PROCALCITON, O2SATVEN in the last 168 hours.  ABG  Recent Labs Lab 12/30/16 0922 12/30/16 0938  PHART 7.406 7.414  PCO2ART 50.5* 47.8  PO2ART 31.0* 48.0*    Liver Enzymes  Recent Labs Lab 01/01/17 0300 01/01/17 1800 01/02/17 0326  ALBUMIN 2.6* 3.1* 3.1*    Cardiac Enzymes No results for input(s): TROPONINI, PROBNP in the last 168 hours.  Glucose  Recent Labs Lab 12/26/16 1645  GLUCAP 88    Imaging Dg Chest Port 1  View  Result Date: 01/02/2017 CLINICAL DATA:  Patient with acute respiratory failure. EXAM: PORTABLE CHEST 1 VIEW COMPARISON:  Chest radiograph 12/30/2016 FINDINGS: Right subclavian central venous catheter tip projects over the superior vena cava. Left-sided approach dual-lumen central venous catheter tip projects against the suspected wall of the superior vena cava/ azygos vein. Monitoring leads overlie the patient. Stable cardiomegaly. Re- demonstrated diffuse bilateral interstitial pulmonary opacities. Retrocardiac consolidation. Probable small left pleural effusion. No pneumothorax. IMPRESSION: Cardiomegaly and associated pulmonary edema. Stable support apparatus. Electronically Signed   By: Lovey Newcomer M.D.   On: 01/02/2017 07:19     STUDIES:  US Renal 3/18 >> normal appearance of kidneys, moderate lower abdominal ascites CXR 3/22>> unchanged edema, bilateral effusions. I have reviewed the images personally.  CULTURES:   ANTIBIOTICS:   SIGNIFICANT EVENTS: 3/18  Admit with SOB, increased LE swelling > decompensated CHF  3/21  CVVHD initiated  3/22  PCCM consulted for respiratory distress   LINES/TUBES: R Jansen TLC 3/19 >>  L IJ HD Cath 3/21 >>   DISCUSSION: 61 y/o F admitted with decompensated CHF, volume overload, AKI with concern for cardiorenal syndrome.  CVVHD in progress.  Tolerating NIV/Bipap.   ASSESSMENT / PLAN:  PULMONARY A: Acute on Chronic Hypoxemic Hypercapneic Respiratory Failure 2/2 Volume overload/Pulmonary edema + CHF exacerbation pEF + OSA + OHS + Pulm HTN Pt with h/o asthma/copd but it is not in exacerbation P:   - Continue BiPAP as needed during the day and mandatory at night - Monitor for intubation risk   CARDIOVASCULAR A:  AF with RVR - on coumadin  Acute on Chronic Diastolic CHF with Volume Overload Acute RV Failure / Cor Pulmonale  HTN  P:  On Amiodarone, Cardizem, milrinone and heparin gtt May need cardioversion but we are holding off until  respiratory status improves. BP has been stable.  RENAL A:   Acute on Chronic Renal Failure - suspect cardiorenal syndrome, renal US normal Hyperkalemia P:   Continue volume removal with CVVH. CVP is still elevated at 17 today Replete lytes  GASTROINTESTINAL A:   Morbid Obesity  P:   PO diet Heart healthy, carb modified diet Fluid restriction  HEMATOLOGIC A:   Anemia - suspect in setting of CKD + frequent phlebotomy  Got 2 units PRBC per cardiology 3/24. No evidence of bleed. Chronic Anticoagulation P:  Follow CBC Continue heparin anticoagulation  INFECTIOUS A:   No acute infectious process  P:   Observe off antibiotics  ENDOCRINE A:   No acute issues  P:   Follow  glucose on BMP  NEUROLOGIC A:   Anxiety  P:   PRN Xanax for anxiety.  FAMILY  - Updates: Patient updated at bedside 3/25 - Inter-disciplinary family meet or Palliative Care meeting due by:  3/29  The patient is critically ill with multiple organ system failure and requires high complexity decision making for assessment and support, frequent evaluation and titration of therapies, advanced monitoring, review of radiographic studies and interpretation of complex data.   Critical Care Time devoted to patient care services, exclusive of separately billable procedures, described in this note is 35 minutes.   Marshell Garfinkel MD [facility] Pager [phone] If no answer or after 3pm call: [phone] 01/02/2017, 9:51 AM

## 2017-01-02 NOTE — Progress Notes (Signed)
Subjective:  CRRT running well- minus another 5 liters from yesterday to today - afib still is issue- an amio and cardizem but BP holding pretty steady    Objective Vital signs in last 24 hours: Vitals:   01/02/17 0400 01/02/17 0500 01/02/17 0600 01/02/17 0700  BP: 111/71 115/74 126/76 118/71  Pulse: (!) 118 (!) 129 (!) 108 (!) 127  Resp: 16 20 15 15   Temp: 98 F (36.7 C)     TempSrc: Axillary     SpO2: 97% 99% 99% 98%  Weight:      Height:       Weight change: -13.608 kg (-30 lb)  Intake/Output Summary (Last 24 hours) at 01/02/17 9147 Last data filed at 01/02/17 0700  Gross per 24 hour  Intake           2500.8 ml  Output             7505 ml  Net          -5004.2 ml    Assessment/Plan: 61 year old black female with morbid obesity and a multitude of pulmonary issues who presents with recurrent congestive heart failure and acute on chronic renal failure 1.Renal- acute on chronic renal failure (recent creatinine on 2/3 was 1.97)- most likely due to cardiorenal syndrome/hemodynamics as urinalysis is fairly bland and renal ultrasound is normal. Failed milrinone in an intensive care unit setting so CRRT initiated 3/21. Good results so far- minus 18 liters 2. Hypertension/volume  - volume overloaded - UOP better on milrinone but not able to achieve diuresis- now CRRT initiated.  CVP still close to 20- continue 200 off per hour UF 3. Anemia  - due to frequent blood draws hospitalization as well as CKD. iron stores low-  giving feraheme- and have added ESA.  Probably is not helping her SOB 4. Hyperkalemia-  resolved  5. Hypophosphatemia- agree with repletion   Merian Wroe A    Labs: Basic Metabolic Panel:  Recent Labs Lab 01/01/17 0300 01/01/17 1800 01/02/17 0326  NA 138  138 136 136  K 4.0  4.0 3.8 3.7  CL 99*  99* 99* 98*  CO2 29  29 27 27   GLUCOSE 95  97 114* 101*  BUN 19  19 16 14   CREATININE 1.73*  1.70* 1.78* 1.75*  CALCIUM 8.5*  8.5* 8.7* 8.7*  PHOS  1.3*  1.3* 1.7* 1.3*  1.3*   Liver Function Tests:  Recent Labs Lab 01/01/17 0300 01/01/17 1800 01/02/17 0326  ALBUMIN 2.6* 3.1* 3.1*   No results for input(s): LIPASE, AMYLASE in the last 168 hours. No results for input(s): AMMONIA in the last 168 hours. CBC:  Recent Labs Lab 12/26/16 1502  12/29/16 0444 12/30/16 0500 12/31/16 0332 01/01/17 0300 01/02/17 0326  WBC 11.1*  < > 7.4 6.8 8.2 7.5 8.5  NEUTROABS 8.1*  --   --   --   --   --   --   HGB 9.5*  < > 7.6* 7.4* 7.6* 7.5* 8.3*  HCT 34.6*  < > 27.1* 26.2* 26.6* 26.7* 27.6*  MCV 90.8  < > 90.3 90.3 89.9 89.9 88.7  PLT 304  < > 145* 134* 146* 190 113*  < > = values in this interval not displayed. Cardiac Enzymes: No results for input(s): CKTOTAL, CKMB, CKMBINDEX, TROPONINI in the last 168 hours. CBG:  Recent Labs Lab 12/26/16 1645  GLUCAP 88    Iron Studies:  No results for input(s): IRON, TIBC, TRANSFERRIN, FERRITIN in the last 72  hours. Studies/Results: No results found. Medications: Infusions: . amiodarone 30 mg/hr (01/02/17 0120)  . diltiazem (CARDIZEM) infusion 10 mg/hr (01/02/17 0221)  . heparin 2,350 Units/hr (01/02/17 0307)  . milrinone 0.25 mcg/kg/min (01/02/17 0445)  . dialysis replacement fluid (prismasate) 300 mL/hr at 01/02/17 0427  . dialysis replacement fluid (prismasate) 300 mL/hr at 01/02/17 0430  . dialysate (PRISMASATE) 1,500 mL/hr at 01/02/17 0345    Scheduled Medications: . bisacodyl  10 mg Oral Daily  . Chlorhexidine Gluconate Cloth  6 each Topical Daily  . darbepoetin (ARANESP) injection - NON-DIALYSIS  150 mcg Subcutaneous Q Tue-1800  . ferrous sulfate  325 mg Oral TID WC  . ferumoxytol  510 mg Intravenous Weekly  . mouth rinse  15 mL Mouth Rinse BID  . oxybutynin  5 mg Oral BID  . senna-docusate  2 tablet Oral QHS  . sodium chloride flush  10-40 mL Intracatheter Q12H  . sodium chloride flush  3 mL Intravenous Q12H    have reviewed scheduled and prn medications.  Physical  Exam: General: alert-  Heart:tachy Lungs: CBS bilat Abdomen: soft,non tender Extremities: pitting edema Dialysis Access: left IJ vascath placed 3/21   01/02/2017,7:14 AM  LOS: 7 days

## 2017-01-03 DIAGNOSIS — I472 Ventricular tachycardia: Secondary | ICD-10-CM

## 2017-01-03 DIAGNOSIS — J449 Chronic obstructive pulmonary disease, unspecified: Secondary | ICD-10-CM

## 2017-01-03 LAB — COOXEMETRY PANEL
CARBOXYHEMOGLOBIN: 2.3 % — AB (ref 0.5–1.5)
METHEMOGLOBIN: 1 % (ref 0.0–1.5)
O2 Saturation: 56 %
Total hemoglobin: 8.3 g/dL — ABNORMAL LOW (ref 12.0–16.0)

## 2017-01-03 LAB — RENAL FUNCTION PANEL
Albumin: 2.9 g/dL — ABNORMAL LOW (ref 3.5–5.0)
Anion gap: 12 (ref 5–15)
BUN: 9 mg/dL (ref 6–20)
CALCIUM: 8.4 mg/dL — AB (ref 8.9–10.3)
CHLORIDE: 98 mmol/L — AB (ref 101–111)
CO2: 25 mmol/L (ref 22–32)
CREATININE: 1.72 mg/dL — AB (ref 0.44–1.00)
GFR, EST AFRICAN AMERICAN: 37 mL/min — AB (ref >60)
GFR, EST NON AFRICAN AMERICAN: 32 mL/min — AB (ref >60)
Glucose, Bld: 92 mg/dL (ref 65–99)
Phosphorus: 3.1 mg/dL (ref 2.5–4.6)
Potassium: 4.3 mmol/L (ref 3.5–5.1)
SODIUM: 135 mmol/L (ref 135–145)

## 2017-01-03 LAB — CBC
HEMATOCRIT: 27.8 % — AB (ref 36.0–46.0)
HEMOGLOBIN: 8.1 g/dL — AB (ref 12.0–15.0)
MCH: 26.2 pg (ref 26.0–34.0)
MCHC: 29.1 g/dL — ABNORMAL LOW (ref 30.0–36.0)
MCV: 90 fL (ref 78.0–100.0)
Platelets: 118 10*3/uL — ABNORMAL LOW (ref 150–400)
RBC: 3.09 MIL/uL — AB (ref 3.87–5.11)
RDW: 26.9 % — ABNORMAL HIGH (ref 11.5–15.5)
WBC: 8.3 10*3/uL (ref 4.0–10.5)

## 2017-01-03 LAB — BASIC METABOLIC PANEL
Anion gap: 11 (ref 5–15)
BUN: 11 mg/dL (ref 6–20)
CHLORIDE: 99 mmol/L — AB (ref 101–111)
CO2: 26 mmol/L (ref 22–32)
CREATININE: 1.7 mg/dL — AB (ref 0.44–1.00)
Calcium: 8.6 mg/dL — ABNORMAL LOW (ref 8.9–10.3)
GFR, EST AFRICAN AMERICAN: 37 mL/min — AB (ref >60)
GFR, EST NON AFRICAN AMERICAN: 32 mL/min — AB (ref >60)
Glucose, Bld: 98 mg/dL (ref 65–99)
POTASSIUM: 4.2 mmol/L (ref 3.5–5.1)
SODIUM: 136 mmol/L (ref 135–145)

## 2017-01-03 LAB — ALBUMIN: Albumin: 3 g/dL — ABNORMAL LOW (ref 3.5–5.0)

## 2017-01-03 LAB — MAGNESIUM: Magnesium: 2.5 mg/dL — ABNORMAL HIGH (ref 1.7–2.4)

## 2017-01-03 LAB — HEPARIN LEVEL (UNFRACTIONATED): HEPARIN UNFRACTIONATED: 0.39 [IU]/mL (ref 0.30–0.70)

## 2017-01-03 LAB — PHOSPHORUS: PHOSPHORUS: 2.7 mg/dL (ref 2.5–4.6)

## 2017-01-03 MED ORDER — SODIUM CHLORIDE 0.9% FLUSH
3.0000 mL | Freq: Two times a day (BID) | INTRAVENOUS | Status: DC
  Administered 2017-01-03: 3 mL via INTRAVENOUS

## 2017-01-03 MED ORDER — SODIUM CHLORIDE 0.9 % IV SOLN: 250.0000 mL | INTRAVENOUS | Status: DC

## 2017-01-03 MED ORDER — SODIUM CHLORIDE 0.9% FLUSH: 3.0000 mL | INTRAVENOUS | Status: DC | PRN

## 2017-01-03 MED ORDER — FUROSEMIDE 10 MG/ML IJ SOLN: 40.0000 mg | Freq: Four times a day (QID) | INTRAMUSCULAR | Status: DC

## 2017-01-03 NOTE — Progress Notes (Signed)
[facility] Rounding Note Subjective:    Continues on CRRT (initiated 3/21) Weight is down from 187.8 kg (414 lb) on 3/21 to today's weight of 158.8 (350 lb) Making minimal if any urine CVP still 15-16 Still in AF with RVR despite amio/dilt with plans for DC cardioversion near future Still w/marginal co-ox despite milrinone  Objective Vital signs in last 24 hours: Vitals:   01/03/17 0900 01/03/17 1000 01/03/17 1003 01/03/17 1100  BP: 124/67 (!) 105/92  94/78  Pulse: (!) 127 (!) 117 (!) 115 (!) 112  Resp: (!) 25 18 17  (!) 22  Temp:      TempSrc:      SpO2: 96% 97% 97% 96%  Weight:      Height:       Weight change: -5.897 kg (-13 lb)  Intake/Output Summary (Last 24 hours) at 01/03/17 1142 Last data filed at 01/03/17 1100  Gross per 24 hour  Intake           3470.1 ml  Output             8334 ml  Net          -4863.9 ml    Physical Exam: MO AAF VS as noted Left IJ vascath placed 3/21 Pleasant, wearing nasal cannula Still w/mild tachypnea Tachy 120's AFib Crackles bases Pitting edema of abd wall, post thighs. LE's below the knees all at least 2+  Labs:   Recent Labs Lab 01/02/17 0326 01/02/17 1610 01/03/17 0353  NA 136 136 136  K 3.7 4.1 4.2  CL 98* 99* 99*  CO2 27 27 26   GLUCOSE 101* 130* 98  BUN 14 13 11   CREATININE 1.75* 1.71* 1.70*  CALCIUM 8.7* 8.5* 8.6*  PHOS 1.3*  1.3* 3.3 2.7     Recent Labs Lab 01/02/17 0326 01/02/17 1610 01/03/17 0353  ALBUMIN 3.1* 3.0* 3.0*   Iron/TIBC/Ferritin/ %Sat    Component Value Date/Time   IRON 64 12/29/2016 0444   TIBC 274 12/29/2016 0444   FERRITIN 115 12/29/2016 0444   IRONPCTSAT 23 12/29/2016 0444     Recent Labs Lab 12/30/16 0500 12/31/16 0332 01/01/17 0300 01/02/17 0326 01/03/17 0353  WBC 6.8 8.2 7.5 8.5 8.3  HGB 7.4* 7.6* 7.5* 8.3* 8.1*  HCT 26.2* 26.6* 26.7* 27.6* 27.8*  MCV 90.3 89.9 89.9 88.7 90.0  PLT 134* 146* 190 113* 118*   Studies/Results: Dg Chest Port 1 View  Result Date:  01/02/2017 CLINICAL DATA:  Patient with acute respiratory failure. EXAM: PORTABLE CHEST 1 VIEW COMPARISON:  Chest radiograph 12/30/2016 FINDINGS: Right subclavian central venous catheter tip projects over the superior vena cava. Left-sided approach dual-lumen central venous catheter tip projects against the suspected wall of the superior vena cava/ azygos vein. Monitoring leads overlie the patient. Stable cardiomegaly. Re- demonstrated diffuse bilateral interstitial pulmonary opacities. Retrocardiac consolidation. Probable small left pleural effusion. No pneumothorax. IMPRESSION: Cardiomegaly and associated pulmonary edema. Stable support apparatus. Electronically Signed   By: Lovey Newcomer M.D.   On: 01/02/2017 07:19   Medications: Infusions: . sodium chloride 250 mL (01/03/17 0930)  . amiodarone 30 mg/hr (01/03/17 0109)  . diltiazem (CARDIZEM) infusion 10 mg/hr (01/03/17 0817)  . heparin 2,350 Units/hr (01/03/17 1016)  . milrinone 0.25 mcg/kg/min (01/03/17 0641)  . dialysis replacement fluid (prismasate) 300 mL/hr at 01/02/17 2124  . dialysis replacement fluid (prismasate) 300 mL/hr at 01/02/17 2128  . dialysate (PRISMASATE) 1,500 mL/hr at 01/03/17 1034    Scheduled Medications: . bisacodyl  10 mg Oral Daily  .  Chlorhexidine Gluconate Cloth  6 each Topical Daily  . darbepoetin (ARANESP) injection - NON-DIALYSIS  150 mcg Subcutaneous Q Tue-1800  . ferrous sulfate  325 mg Oral TID WC  . ferumoxytol  510 mg Intravenous Weekly  . mouth rinse  15 mL Mouth Rinse BID  . oxybutynin  5 mg Oral BID  . senna-docusate  2 tablet Oral QHS  . sodium chloride flush  10-40 mL Intracatheter Q12H  . sodium chloride flush  3 mL Intravenous Q12H  . sodium chloride flush  3 mL Intravenous Q12H  . sodium phosphate  Dextrose 5% IVPB  30 mmol Intravenous BID   Background: medical history of morbid obesity / wheelchair bound, untreated OHS, chronic combined systolic / diastolic CHF, NICM, AF on coumadin, HTN, 3L  O2 dependent, pulmonary hypertension, former smoker (11 pk years) with COPD & CKD III.  The patient suffered a left fibular fracture in late 2017.  She was recently admitted from 1/28-11/13/16 for acute CHF exacerbation and presents with recurrent congestive heart failure (RV failure)/AF and acute on chronic renal failure. CRRT initiated 3/21 for volume overload. (Baseline creatinine 1.6-1.9 w/intermittent prior episodes AKI on CKD, creatinines as high as 4.4 on prior admissions). Creatinine 3.46 at initiation of CRRT.   Assessment/Plan:   1. Renal- AKI on CKD  (creatinine on 2/3 was 1.97)- most likely due to cardiorenal syndrome/hemodynamic etiology as UA is fairly bland and renal ultrasound is normal. Failed milrinone/diuretics in an ICU setting so CRRT initiated 3/21. Good results so far but still fluid to go. No xray today - on 3/25 still showed pulmonary edema. Making little if any urine now. Conceivable that if we get her dry enough to breathe, she may require chronic dialysis (she is aware and says "whatever it takes") 2. Vol overload/anasarca  - was unable to diurese on milrinone. Now vol much improved w/CRRT but making very little urine. Still has pitting edema LE's 3+. abd wall 2+. I think will need CRRT for several more days 3. Anemia  - Transfused 3/24, 3/25. Got Feraheme 3/21 for low tsat and is on Aranesp 150 QTuesday   4. Hyperkalemia-  resolved  5. Hypophosphatemia- repleting w/IV Na phos 30 BID 6. AonC dCHF 7. RV failure - for RHC once vol optimized 8. AF/RVR - for DC cardioversion 3/27 9. OSA/OHV - requiring BIPAP for sleep  Jamal Maes, MD [facility] [phone] Pager 01/03/2017, 11:50 AM

## 2017-01-03 NOTE — Progress Notes (Addendum)
PULMONARY / CRITICAL CARE MEDICINE   Name: Paige Marshall MRN: [id number] DOB: 24-Oct-1955    ADMISSION DATE:  12/26/2016 CONSULTATION DATE:  12/30/16  REFERRING MD:  Dr. Tommy Medal   CHIEF COMPLAINT:  Shortness of breath   HISTORY OF PRESENT ILLNESS:  61 y/o F admitted from rehab facility (resident since November 2017) on 3/18 with complaints of shortness of breath & lower extremity swelling.   The patient has a medical history of morbid obesity / wheelchair bound, untreated OHS, chronic combined systolic / diastolic CHF, NICM, AF on coumadin, HTN, 3L O2 dependent, pulmonary hypertension, former smoker (11 pk years) with COPD & CKD III.  The patient suffered a left fibular fracture in late 2017.  She was recently admitted from 1/28-11/13/16 for acute CHF exacerbation.  At that time, her discharge weight was 370lbs.    She returned from the rehab facility on 3/18 with increased SOB and lower extremity swelling.  She if followed by Dr. Gwenlyn Found for cardiology and was recently switched to lasix and metolazone.  Lasix was discontinued at the SNF due to rising sr cr.  Her admit weight was 411lbs.  She was admitted by IMTS for decompensated CHF.  She developed worsening AKI with concern for cardio-renal syndrome.  An HD catheter was inserted 3/21 and CVVHD was initiated.   She remains on milrinone, amiodarone and heparin gtt's.  Rate control has been difficult to achieve.  Over the past 24 hours, she is net negative 3.5L.  She developed respiratory distress am of 3/22 and BiPAP was initiated.    PCCM consulted for evaluation.   SUBJECTIVE:  Required BiPAP overnight for increase WOB  VITAL SIGNS: BP (!) 105/92   Pulse (!) 115   Temp 97.8 F (36.6 C) (Oral)   Resp 17   Ht 5' 7.5" (1.715 m)   Wt (!) 158.8 kg (350 lb)   LMP 09/07/2011   SpO2 97%   BMI 54.01 kg/m   HEMODYNAMICS: CVP:  [15 mmHg-20 mmHg] 15 mmHg  VENTILATOR SETTINGS: FiO2 (%):  [50 %] 50 %  INTAKE / OUTPUT: I/O last 3  completed shifts: In: 3184.2 [P.O.:340; I.V.:2371.2; IV [id number]:782] Out: 95621 [Urine:20; HYQMV:78469]  PHYSICAL EXAMINATION: Gen:      No acute distress, morbid obesity, off BiPAP HEENT:  EOMI, sclera anicteric Neck:      No masses; no thyromegaly Lungs:    Distant BS bilaterally CV:         Regular rate and rhythm; no murmurs Abd:      + bowel sounds; soft, non-tender; no palpable masses, no distension Ext:    1-2 + edema; adequate peripheral perfusion Skin:       Warm and dry; no rash Neuro:     Alert and oriented x 3  LABS:  BMET  Recent Labs Lab 01/02/17 0326 01/02/17 1610 01/03/17 0353  NA 136 136 136  K 3.7 4.1 4.2  CL 98* 99* 99*  CO2 27 27 26   BUN 14 13 11   CREATININE 1.75* 1.71* 1.70*  GLUCOSE 101* 130* 98    Electrolytes  Recent Labs Lab 01/01/17 0300  01/02/17 0326 01/02/17 1610 01/03/17 0353  CALCIUM 8.5*  8.5*  < > 8.7* 8.5* 8.6*  MG 2.1  --  2.7*  --  2.5*  PHOS 1.3*  1.3*  < > 1.3*  1.3* 3.3 2.7  < > = values in this interval not displayed.  CBC  Recent Labs Lab 01/01/17 0300 01/02/17 0326 01/03/17 0353  WBC 7.5 8.5 8.3  HGB 7.5* 8.3* 8.1*  HCT 26.7* 27.6* 27.8*  PLT 190 113* 118*    Coag's  Recent Labs Lab 12/29/16 0444 12/30/16 0500 12/31/16 0332  INR 1.92 1.43 1.32    Sepsis Markers No results for input(s): LATICACIDVEN, PROCALCITON, O2SATVEN in the last 168 hours.  ABG  Recent Labs Lab 12/30/16 0922 12/30/16 0938  PHART 7.406 7.414  PCO2ART 50.5* 47.8  PO2ART 31.0* 48.0*    Liver Enzymes  Recent Labs Lab 01/02/17 0326 01/02/17 1610 01/03/17 0353  ALBUMIN 3.1* 3.0* 3.0*    Cardiac Enzymes No results for input(s): TROPONINI, PROBNP in the last 168 hours.  Glucose No results for input(s): GLUCAP in the last 168 hours.  Imaging No results found.   STUDIES:  US Renal 3/18 >> normal appearance of kidneys, moderate lower abdominal ascites CXR 3/22>> unchanged edema, bilateral effusions. I have  reviewed the images personally.  CULTURES:   ANTIBIOTICS:   SIGNIFICANT EVENTS: 3/18  Admit with SOB, increased LE swelling > decompensated CHF  3/21  CVVHD initiated  3/22  PCCM consulted for respiratory distress   LINES/TUBES: R [place] TLC 3/19 >>  L IJ HD Cath 3/21 >>   I reviewed CXR mylself, cardiomegally and pulmonary edema noted.  DISCUSSION: 61 y/o F admitted with decompensated CHF, volume overload, AKI with concern for cardiorenal syndrome.  CVVHD in progress.  Tolerating NIV/Bipap.   ASSESSMENT / PLAN:  PULMONARY A: Acute on Chronic Hypoxemic Hypercapneic Respiratory Failure 2/2 Volume overload/Pulmonary edema + CHF exacerbation pEF + OSA + OHS + Pulm HTN Pt with h/o asthma/copd but it is not in exacerbation P:   - Continue BiPAP as needed during the day and mandatory at night - Monitor for intubation risk  CARDIOVASCULAR A:  AF with RVR - on coumadin  Acute on Chronic Diastolic CHF with Volume Overload Acute RV Failure / Cor Pulmonale  HTN  P:  - On Amiodarone, Cardizem, milrinone and heparin gtt - Cardioversion per cards  RENAL A:   Acute on Chronic Renal Failure - suspect cardiorenal syndrome, renal US normal Hyperkalemia P:   - Continue volume removal with CVVH. CVP is still elevated at 17 today - Replete lytes as indicated - CRRT per renal, continue volume negative  GASTROINTESTINAL A:   Morbid Obesity  P:   - PO diet - Heart healthy, carb modified diet - Fluid restriction  HEMATOLOGIC A:   Anemia - suspect in setting of CKD + frequent phlebotomy  Got 2 units PRBC per cardiology 3/24. No evidence of bleed. Chronic Anticoagulation P:  - Follow CBC - Continue heparin anticoagulation  INFECTIOUS A:   No acute infectious process  P:   - Observe off antibiotics  ENDOCRINE A:   No acute issues  P:   - Follow glucose on BMP  NEUROLOGIC A:   Anxiety  P:   - PRN Xanax for anxiety.  FAMILY  - Updates: No family bedside 3/26 -  Inter-disciplinary family meet or Palliative Care meeting due by:  3/29  Discussed with PCCM-NP and bedside RN.[name]  Rush Farmer, M.D. [facility] Pulmonary/Critical Care Medicine. Pager: [phone]. After hours pager: [phone].  01/03/2017, 10:27 AM

## 2017-01-03 NOTE — Evaluation (Signed)
Physical Therapy Evaluation Patient Details Name: Paige Marshall MRN: [id number] DOB: July 22, 1956 Today's Date: 01/03/2017   History of Present Illness  61 y.o. female with morbid obesity / wheelchair bound, untreated OHS, chronic combined systolic / diastolic CHF, NICM, AF on coumadin, HTN, 3L O2 dependent, pulmonary hypertension, former smoker (11 pk years) with COPD & CKD III.  The patient suffered a left fibular fracture in late 2017.  She was recently admitted from 1/28-11/13/16 for acute CHF exacerbation. Admitted 12/26/16 from SNF with acute CHF. HD catheter placed 12/29/16  Clinical Impression  Pt admitted with above diagnosis. Pt currently with functional limitations due to the deficits listed below (see PT Problem List). At SNF pt was able to stand in parallel bars for 2-3 min with assistance for sit to stand, hoyer lift for bed to WC. Performed bed level BUE/LE exercises, did not attempt further activity as pt had HR up to 140 and SaO2 drop to 87% on 10L O2 with activity.  Pt will benefit from skilled PT to increase their independence and safety with mobility to allow discharge to the venue listed below.       Follow Up Recommendations SNF;Supervision/Assistance - 24 hour    Equipment Recommendations  None recommended by PT    Recommendations for Other Services       Precautions / Restrictions Precautions Precautions: Fall Restrictions Weight Bearing Restrictions: No      Mobility  Bed Mobility               General bed mobility comments: NT -pt on CRRT, uses Hoyer for bed to [facility] transfer,  and HR up to 140 with LE exercises, SaO2 down to 87% on 10 L O2 with activity  Transfers                    Ambulation/Gait                Stairs            Wheelchair Mobility    Modified Rankin (Stroke Patients Only)       Balance                                             Pertinent Vitals/Pain Pain Assessment: 0-10 Pain  Score: 4  Pain Location: R knee with activity Pain Descriptors / Indicators: Aching Pain Intervention(s): Limited activity within patient's tolerance;Monitored during session;Relaxation    Home Living Family/patient expects to be discharged to:: Skilled nursing facility                      Prior Function Level of Independence: Needs assistance   Gait / Transfers Assistance Needed: hoyer lift from bed <> w/c. In PT has assistance to stand in parallel bars for 2-3 minutes, hasn't walked since fibula fx in Nov 2017, prior to that she walked with a RW and used O2 at night.   ADL's / Homemaking Assistance Needed: assist with ADL at bed level        Hand Dominance   Dominant Hand: Right    Extremity/Trunk Assessment   Upper Extremity Assessment Upper Extremity Assessment: Overall WFL for tasks assessed    Lower Extremity Assessment Lower Extremity Assessment:  (bed level assessment, sensation intact to light touch BLEs, ankle PF/DF WNL, pt able to actively flex B hips and knees,  hip/knee AAROM WFL)       Communication   Communication: No difficulties  Cognition Arousal/Alertness: Awake/alert Behavior During Therapy: WFL for tasks assessed/performed Overall Cognitive Status: Within Functional Limits for tasks assessed                                        General Comments      Exercises General Exercises - Upper Extremity Shoulder Flexion: AROM;Both;10 reps;Supine General Exercises - Lower Extremity Ankle Circles/Pumps: AROM;Both;10 reps;Supine Quad Sets: AROM;Both;10 reps;Supine Heel Slides: AAROM;Both;10 reps;Supine Hip ABduction/ADduction: AAROM;Both;10 reps;Supine   Assessment/Plan    PT Assessment Patient needs continued PT services  PT Problem List Decreased strength;Decreased activity tolerance;Decreased mobility;Cardiopulmonary status limiting activity;Obesity       PT Treatment Interventions Functional mobility training;Balance  training;Therapeutic exercise;Therapeutic activities;Patient/family education    PT Goals (Current goals can be found in the Care Plan section)  Acute Rehab PT Goals Patient Stated Goal: to walk PT Goal Formulation: With patient Time For Goal Achievement: 01/17/17 Potential to Achieve Goals: Good    Frequency Min 3X/week   Barriers to discharge        Co-evaluation               End of Session   Activity Tolerance: Patient tolerated treatment well Patient left: in bed;with call bell/phone within reach;with nursing/sitter in room Nurse Communication: Mobility status;Need for lift equipment PT Visit Diagnosis: Muscle weakness (generalized) (M62.81);Difficulty in walking, not elsewhere classified (R26.2)    Time: 4827-0786 PT Time Calculation (min) (ACUTE ONLY): 19 min   Charges:   PT Evaluation $PT Eval Moderate Complexity: 1 Procedure     PT G Codes:          Philomena Doheny 01/03/2017, 2:51 PM [phone]

## 2017-01-03 NOTE — Progress Notes (Signed)
ANTICOAGULATION CONSULT NOTE - Follow Up Consult  Pharmacy Consult for Heparin Indication: atrial fibrillation  Allergies  Allergen Reactions  . Penicillins Hives, Itching, Swelling and Other (See Comments)    Tongue swelling Has patient had a PCN reaction causing immediate rash, facial/tongue/throat swelling, SOB or lightheadedness with hypotension: Yes  Clarified with the patient her penicillin allergy. Pt has tolerated cefepime in the past. Pt also states that she can take amoxicillin.    . Citrus Hives    Patient Measurements: Height: 5' 7.5" (171.5 cm) Weight: (!) 350 lb (158.8 kg) IBW/kg (Calculated) : 62.75 Heparin Dosing Weight: 111kg  Vital Signs: Temp: 97.8 F (36.6 C) (03/26 0800) Temp Source: Oral (03/26 0800) BP: 124/67 (03/26 0900) Pulse Rate: 127 (03/26 0900)  Labs:  Recent Labs  01/01/17 0300 01/01/17 0445  01/02/17 0326 01/02/17 1610 01/03/17 0353  HGB 7.5*  --   --  8.3*  --  8.1*  HCT 26.7*  --   --  27.6*  --  27.8*  PLT 190  --   --  113*  --  118*  HEPARINUNFRC  --  0.41  --  0.48  --  0.39  CREATININE 1.73*  1.70*  --   < > 1.75* 1.71* 1.70*  < > = values in this interval not displayed.  Estimated Creatinine Clearance: 57.6 mL/min (A) (by C-G formula based on SCr of 1.7 mg/dL (H)).     Assessment: 60yof on coumadin pta for afib, admitted with volume overload and renal failure. INR 2.84 on admit.  Heparin level 0.39  continues to be at goal this morning. Warfarin on hold while on CVVHD,  Hgb improved to 8 after 2 units. No bleeding has been noted.  Goal of Therapy:  INR 2-3 Heparin level 0.3-0.7 units/ml Monitor platelets by anticoagulation protocol: Yes   Plan:  1) Continue heparin at 2350 units/hr 2) Daily heparin level and CBC 3) f/u resume oral AC  Bonnita Nasuti Pharm.D. CPP, BCPS Clinical Pharmacist [phone] 01/03/2017 9:31 AM

## 2017-01-03 NOTE — Progress Notes (Signed)
Advanced Heart Failure Rounding Note  PCP: EDWARDS, Milford Cage, NP  Primary Cardiologist: Dr. Gwenlyn Found   Subjective:    Admitted with volume overload, RV failure. Milrinone 0.69mcg started 3/19. Started CRRT 12/29/16.   Remains on CVVHD. Pulling 250-300cc/hr.  Weight down to 350 (down 63 lbs)  CVP 15-16  Feeling better this am.  Wearing BiPAP until 830. No CP or SOB. No orthopnea or PND. Voice stronger.  Denies lightheadedness or dizziness.   Coox 56.0% this am on milrinone 0.25 mcg/kg/min.   Now off Bipap and on 10L [place]. Still in AF with RVR 120s on IOV amio.   Hgb 8.1 s/p 2u PRBCs on 3/24 . No obvious bleeding   Objective:   Weight Range: (!) 350 lb (158.8 kg) Body mass index is 54.01 kg/m.   Vital Signs:   Temp:  [97.6 F (36.4 C)-98.8 F (37.1 C)] 97.8 F (36.6 C) (03/26 0400) Pulse Rate:  [110-131] 119 (03/26 0800) Resp:  [13-30] 15 (03/26 0800) BP: (98-134)/(45-99) 115/65 (03/26 0800) SpO2:  [87 %-100 %] 99 % (03/26 0800) FiO2 (%):  [50 %] 50 % (03/26 0400) Weight:  [350 lb (158.8 kg)] 350 lb (158.8 kg) (03/26 0300) Last BM Date: 12/30/16  Weight change: Filed Weights   01/01/17 0500 01/02/17 0325 01/03/17 0300  Weight: (!) 393 lb (178.3 kg) (!) 363 lb (164.7 kg) (!) 350 lb (158.8 kg)    Intake/Output:   Intake/Output Summary (Last 24 hours) at 01/03/17 0807 Last data filed at 01/03/17 0700  Gross per 24 hour  Intake           2289.6 ml  Output             7896 ml  Net          -5606.4 ml     Physical Exam: CVP 15-16 General: Obese, NAD lying in bed. Talking on phone HEENT: Normal, anicteric on [place] Neck: Supple. JVP difficult to assess with size. Carotids 2+ bilat; no bruits  LIJ trialysis catheter  Cor: PMI non-palpable. Tachy IRR. IRR . 2/6 TR no S3. Lungs: Decreased throughout. No wheezing  Abdomen: Morbidly obese, NT, mild distention, unable to assess for HSM No bruits or masses. +BS Extremities: no cyanosis, clubbing, rash. 2+ edema into  thighs Neuro: Alert & oriented x 3. Cranial nerves grossly intact. Moves all 4 extremities w/o difficulty. Affect pleasant  No asterixis   Telemetry: AF 110-120 Personally reviewed   Labs: CBC  Recent Labs  01/02/17 0326 01/03/17 0353  WBC 8.5 8.3  HGB 8.3* 8.1*  HCT 27.6* 27.8*  MCV 88.7 90.0  PLT 113* 381*   Basic Metabolic Panel  Recent Labs  01/02/17 0326 01/02/17 1610 01/03/17 0353  NA 136 136 136  K 3.7 4.1 4.2  CL 98* 99* 99*  CO2 27 27 26   GLUCOSE 101* 130* 98  BUN 14 13 11   CREATININE 1.75* 1.71* 1.70*  CALCIUM 8.7* 8.5* 8.6*  MG 2.7*  --  2.5*  PHOS 1.3*  1.3* 3.3 2.7    BNP: BNP (last 3 results)  Recent Labs  09/18/16 1126 11/07/16 1633 12/26/16 1643  BNP 991.0* 490.4* 1,116.0*    Medications:     Scheduled Medications: . bisacodyl  10 mg Oral Daily  . Chlorhexidine Gluconate Cloth  6 each Topical Daily  . darbepoetin (ARANESP) injection - NON-DIALYSIS  150 mcg Subcutaneous Q Tue-1800  . ferrous sulfate  325 mg Oral TID WC  . ferumoxytol  510  mg Intravenous Weekly  . mouth rinse  15 mL Mouth Rinse BID  . oxybutynin  5 mg Oral BID  . senna-docusate  2 tablet Oral QHS  . sodium chloride flush  10-40 mL Intracatheter Q12H  . sodium chloride flush  3 mL Intravenous Q12H  . sodium phosphate  Dextrose 5% IVPB  30 mmol Intravenous BID    Infusions: . amiodarone 30 mg/hr (01/03/17 0109)  . diltiazem (CARDIZEM) infusion 10 mg/hr (01/02/17 1130)  . heparin 2,350 Units/hr (01/03/17 0014)  . milrinone 0.25 mcg/kg/min (01/03/17 0641)  . dialysis replacement fluid (prismasate) 300 mL/hr at 01/02/17 2124  . dialysis replacement fluid (prismasate) 300 mL/hr at 01/02/17 2128  . dialysate (PRISMASATE) 1,500 mL/hr at 01/03/17 0701    PRN Medications: sodium chloride, acetaminophen, ALPRAZolam, heparin, ondansetron (ZOFRAN) IV, oxyCODONE, polyethylene glycol, promethazine, sodium chloride, sodium chloride, sodium chloride flush, sodium chloride  flush   Assessment/Plan   1. Acute on chronic diastolic CHF 2. Paroxysmal atrial fibrillation on Coumadin 3. Acute on chronic kidney disease stage IV 4. Obesity hypoventilation syndrome.  5. Hyperkalemia 6. Acute RV failure/cor pulmonale 7. OSA, noncompliant with CPAP 8. Acute on chronic respiratory failure 9. Anemia 10. NSVT  She is down 60 + pounds with CVVHD and IV diuresis.   Coox remains marginal on 0.25 mcg/kg/min of milrinone.  Consider RHC once volume status optimized.   Got 2u RBCs 3/24. hgb 7.5-> 8.3-> 8.1. No overt bleeding. Continue to follow. Continue PPI.    Remains in AF RVR on amio and diltiazem. On heparin, coumadin on hold.  Has had no lapse in coverage so would be candidate for DC-CV WITHOUT TEE once stable. Will discuss timing with MD.   Having occasional runs of NSVT. Goal K > 4.0 and Mg > 2.0. K 4.2 and Mg 2.5 today.    Length of Stay: [address]  Annamaria Helling  01/03/2017, 8:07 AM  Advanced Heart Failure Team  Pager [phone] M-F 7am-4pm.  Please contact [facility] for night-coverage after hours (4p -7a ) and weekends on [email or web address]  Patient seen and examined with Oda Kilts, PA-C. We discussed all aspects of the encounter. I agree with the assessment and plan as stated above.   She continues to improve. Volume status getting better with CVVHD. Now down 60+ pounds with more to go. Continue CVVHD.   Co-ox marginal. Continue milrinone. Will likely need RHC after optimization.  Remains in AF with RVR. On heparin. Plan DCCV tomorrow.   Still with occasional NSVT. Electrolytes ok.   PT to see once off CVVHD  Glori Bickers, MD  9:52 AM

## 2017-01-04 ENCOUNTER — Encounter (HOSPITAL_COMMUNITY)
Admission: EM | Disposition: A | Discharge status: Nursing Home (Includes ICF) | Payer: Self-pay | Source: Home / Self Care | Attending: Pulmonary Disease

## 2017-01-04 ENCOUNTER — Encounter (HOSPITAL_COMMUNITY): Payer: Self-pay | Admitting: Anesthesiology

## 2017-01-04 ENCOUNTER — Inpatient Hospital Stay (HOSPITAL_COMMUNITY): Payer: Medicare Other

## 2017-01-04 DIAGNOSIS — Z9889 Other specified postprocedural states: Secondary | ICD-10-CM

## 2017-01-04 LAB — BASIC METABOLIC PANEL
ANION GAP: 11 (ref 5–15)
BUN: 9 mg/dL (ref 6–20)
CHLORIDE: 99 mmol/L — AB (ref 101–111)
CO2: 26 mmol/L (ref 22–32)
CREATININE: 1.77 mg/dL — AB (ref 0.44–1.00)
Calcium: 8.5 mg/dL — ABNORMAL LOW (ref 8.9–10.3)
GFR calc non Af Amer: 31 mL/min — ABNORMAL LOW (ref >60)
GFR, EST AFRICAN AMERICAN: 35 mL/min — AB (ref >60)
Glucose, Bld: 94 mg/dL (ref 65–99)
POTASSIUM: 3.9 mmol/L (ref 3.5–5.1)
Sodium: 136 mmol/L (ref 135–145)

## 2017-01-04 LAB — RENAL FUNCTION PANEL
ALBUMIN: 2.8 g/dL — AB (ref 3.5–5.0)
ANION GAP: 11 (ref 5–15)
BUN: 10 mg/dL (ref 6–20)
CALCIUM: 8.6 mg/dL — AB (ref 8.9–10.3)
CO2: 26 mmol/L (ref 22–32)
Chloride: 98 mmol/L — ABNORMAL LOW (ref 101–111)
Creatinine, Ser: 1.86 mg/dL — ABNORMAL HIGH (ref 0.44–1.00)
GFR, EST AFRICAN AMERICAN: 33 mL/min — AB (ref >60)
GFR, EST NON AFRICAN AMERICAN: 29 mL/min — AB (ref >60)
Glucose, Bld: 96 mg/dL (ref 65–99)
PHOSPHORUS: 2.5 mg/dL (ref 2.5–4.6)
Potassium: 4.2 mmol/L (ref 3.5–5.1)
SODIUM: 135 mmol/L (ref 135–145)

## 2017-01-04 LAB — MAGNESIUM: Magnesium: 2.3 mg/dL (ref 1.7–2.4)

## 2017-01-04 LAB — HEPARIN LEVEL (UNFRACTIONATED)
HEPARIN UNFRACTIONATED: 0.13 [IU]/mL — AB (ref 0.30–0.70)
HEPARIN UNFRACTIONATED: 0.14 [IU]/mL — AB (ref 0.30–0.70)
Heparin Unfractionated: 0.3 IU/mL (ref 0.30–0.70)

## 2017-01-04 LAB — ALBUMIN: Albumin: 2.8 g/dL — ABNORMAL LOW (ref 3.5–5.0)

## 2017-01-04 LAB — COOXEMETRY PANEL
Carboxyhemoglobin: 2.1 % — ABNORMAL HIGH (ref 0.5–1.5)
Methemoglobin: 1.2 % (ref 0.0–1.5)
O2 SAT: 57.5 %
Total hemoglobin: 10.7 g/dL — ABNORMAL LOW (ref 12.0–16.0)

## 2017-01-04 LAB — CBC
HEMATOCRIT: 26.4 % — AB (ref 36.0–46.0)
HEMOGLOBIN: 7.9 g/dL — AB (ref 12.0–15.0)
MCH: 26.9 pg (ref 26.0–34.0)
MCHC: 29.9 g/dL — AB (ref 30.0–36.0)
MCV: 89.8 fL (ref 78.0–100.0)
Platelets: 110 10*3/uL — ABNORMAL LOW (ref 150–400)
RBC: 2.94 MIL/uL — ABNORMAL LOW (ref 3.87–5.11)
RDW: 27.8 % — ABNORMAL HIGH (ref 11.5–15.5)
WBC: 8.5 10*3/uL (ref 4.0–10.5)

## 2017-01-04 LAB — PHOSPHORUS: PHOSPHORUS: 3.6 mg/dL (ref 2.5–4.6)

## 2017-01-04 SURGERY — CANCELLED PROCEDURE

## 2017-01-04 MED ORDER — HEPARIN BOLUS VIA INFUSION
2000.0000 [IU] | Freq: Once | INTRAVENOUS | Status: AC
  Administered 2017-01-04: 2000 [IU] via INTRAVENOUS
  Filled 2017-01-04: qty 2000

## 2017-01-04 MED ORDER — SORBITOL 70 % SOLN
30.0000 mL | Freq: Every day | Status: DC | PRN
  Administered 2017-01-04 – 2017-01-14 (×4): 30 mL via ORAL
  Filled 2017-01-04 (×5): qty 30

## 2017-01-04 MED ORDER — PROPOFOL 1000 MG/100ML IV EMUL
100.0000 mg | Freq: Once | INTRAVENOUS | Status: AC
  Administered 2017-01-04: 100 mg via INTRAVENOUS

## 2017-01-04 MED ORDER — ETOMIDATE 2 MG/ML IV SOLN
INTRAVENOUS | Status: AC
  Administered 2017-01-04: 20 mg
  Filled 2017-01-04: qty 10

## 2017-01-04 MED ORDER — PROPOFOL 1000 MG/100ML IV EMUL
INTRAVENOUS | Status: AC
  Filled 2017-01-04: qty 100

## 2017-01-04 MED ORDER — ETOMIDATE 2 MG/ML IV SOLN: 20.0000 mg | Freq: Once | INTRAVENOUS | Status: AC

## 2017-01-04 NOTE — Anesthesia Preprocedure Evaluation (Deleted)
Anesthesia Evaluation  Patient identified by MRN, date of birth, ID band Patient awake    Reviewed: Allergy & Precautions, NPO status , Patient's Chart, lab work & pertinent test results  Airway Mallampati: III  TM Distance: >3 FB Neck ROM: Full    Dental no notable dental hx.    Pulmonary shortness of breath, asthma , sleep apnea , pneumonia, COPD, former smoker,    Pulmonary exam normal breath sounds clear to auscultation       Cardiovascular hypertension, + Peripheral Vascular Disease and +CHF  Normal cardiovascular exam Rhythm:Regular Rate:Normal  Echo 09/2016 - Left ventricle: The cavity size was normal. Wall thickness was increased in a pattern of moderate LVH. Systolic function was normal. The estimated ejection fraction was in the range of 50% to 55%. Wall motion was normal; there were no regional wall motion abnormalities. - Ventricular septum: The contour showed diastolic flattening. - Aorta: Aortic root dimension: 40 mm (ED). - Left atrium: The atrium was moderately dilated. - Right ventricle: The cavity size was moderately dilated. Wall thickness was normal. Systolic function was mildly reduced. - Right atrium: The atrium was severely dilated. - Tricuspid valve: There was moderate regurgitation. - Pulmonary arteries: Systolic pressure was moderately increased. PA peak pressure: 49 mm Hg (S).   Neuro/Psych CVA negative psych ROS   GI/Hepatic negative GI ROS, Neg liver ROS,   Endo/Other  negative endocrine ROS  Renal/GU ESRF and Renal InsufficiencyRenal disease     Musculoskeletal  (+) Arthritis ,   Abdominal (+) + obese,   Peds  Hematology negative hematology ROS (+) anemia ,   Anesthesia Other Findings   Reproductive/Obstetrics negative OB ROS                             Anesthesia Physical Anesthesia Plan  ASA: IV  Anesthesia Plan: General   Post-op Pain Management:     Induction: Intravenous  Airway Management Planned: Mask  Additional Equipment:   Intra-op Plan:   Post-operative Plan:   Informed Consent: I have reviewed the patients History and Physical, chart, labs and discussed the procedure including the risks, benefits and alternatives for the proposed anesthesia with the patient or authorized representative who has indicated his/her understanding and acceptance.   Dental advisory given  Plan Discussed with: CRNA  Anesthesia Plan Comments:         Anesthesia Quick Evaluation

## 2017-01-04 NOTE — Progress Notes (Signed)
ANTICOAGULATION CONSULT NOTE - Follow Up Consult  Pharmacy Consult for Heparin Indication: atrial fibrillation  Allergies  Allergen Reactions  . Penicillins Hives, Itching, Swelling and Other (See Comments)    Tongue swelling Has patient had a PCN reaction causing immediate rash, facial/tongue/throat swelling, SOB or lightheadedness with hypotension: Yes  Clarified with the patient her penicillin allergy. Pt has tolerated cefepime in the past. Pt also states that she can take amoxicillin.    . Citrus Hives    Patient Measurements: Height: 5' 7.5" (171.5 cm) Weight: (!) 335 lb (152 kg) IBW/kg (Calculated) : 62.75 Heparin Dosing Weight: 111kg  Vital Signs: Temp: 98.5 F (36.9 C) (03/27 0850) Temp Source: Oral (03/27 0850) BP: 96/64 (03/27 1100) Pulse Rate: 125 (03/27 1100)  Labs:  Recent Labs  01/02/17 0326  01/03/17 0353 01/03/17 1644 01/04/17 0510 01/04/17 0610 01/04/17 0855  HGB 8.3*  --  8.1*  --  7.9*  --   --   HCT 27.6*  --  27.8*  --  26.4*  --   --   PLT 113*  --  118*  --  110*  --   --   HEPARINUNFRC 0.48  --  0.39  --   --  0.13* 0.14*  CREATININE 1.75*  < > 1.70* 1.72* 1.77*  --   --   < > = values in this interval not displayed.  Estimated Creatinine Clearance: 53.9 mL/min (A) (by C-G formula based on SCr of 1.77 mg/dL (H)).     Assessment: 60yof on coumadin pta for afib, admitted with volume overload and renal failure. INR 2.84 on admit.  Heparin level profound drop overnight has been stable 0.4 > 0.13 this am repeat 0.14 on same drip rate 2350 uts/hr and no problems with IV line.  Warfarin on hold while on CVVHD,  Hgb improved to 8 after 2 units PRBC last week- slow drift down- watch. No bleeding has been noted.  Goal of Therapy:  INR 2-3 Heparin level 0.3-0.7 units/ml Monitor platelets by anticoagulation protocol: Yes   Plan:  Bolus heparin 2000 uts IV x1 Increase heparin drip 2500 uts/hr HL in 6hr from rate increase Daily HL, CBC,  Protime f/u oral AC  Bonnita Nasuti Pharm.D. CPP, BCPS Clinical Pharmacist [phone] 01/04/2017 11:08 AM

## 2017-01-04 NOTE — Progress Notes (Signed)
Advanced Heart Failure Rounding Note  PCP: EDWARDS, Milford Cage, NP  Primary Cardiologist: Dr. Gwenlyn Found   Subjective:    Admitted with volume overload, RV failure. Milrinone 0.29mcg started 3/19. Started CRRT 12/29/16.   Remains on CVVHD pulling 250-344mL/hr. Weight down 335 lbs (down 78 lbs)  CVP 13-14  No complaints this am. Denies SOB, CP, lightheadedness, or dizziness.  Seems OK with the possibility of permanent dialysis.   Coox 57.5% this am on milrinone 0.25 mcg/kg/min.    Now off Bipap and on 10L [place]. Still in AF with RVR 120-130s on IV amio. Feeling better with volume removal. Hemodynamically stable despite persistent AF with RVR. No bleeding on heparin.   Hgb 7.9 s/p 2u PRBCs on 3/24 . No overt bleeding.    Objective:   Weight Range: (!) 335 lb (152 kg) Body mass index is 51.69 kg/m.   Vital Signs:   Temp:  [97.3 F (36.3 C)-100 F (37.8 C)] 97.8 F (36.6 C) (03/27 0400) Pulse Rate:  [57-144] 120 (03/27 0700) Resp:  [16-26] 17 (03/27 0700) BP: (88-124)/(50-92) 88/62 (03/27 0700) SpO2:  [93 %-100 %] 99 % (03/27 0700) FiO2 (%):  [50 %] 50 % (03/27 0700) Weight:  [335 lb (152 kg)] 335 lb (152 kg) (03/27 0500) Last BM Date: 12/30/16  Weight change: Filed Weights   01/02/17 0325 01/03/17 0300 01/04/17 0500  Weight: (!) 363 lb (164.7 kg) (!) 350 lb (158.8 kg) (!) 335 lb (152 kg)    Intake/Output:   Intake/Output Summary (Last 24 hours) at 01/04/17 0814 Last data filed at 01/04/17 0700  Gross per 24 hour  Intake           3347.7 ml  Output             8177 ml  Net          -4829.3 ml     Physical Exam: CVP 13-14 checked personally  General: Obese, lying in bed. On high-flow [place] HEENT: Normal, on O2 via [place]. Anicteric Neck: Supple. JVP unable to evaluate due to size. Carotids 2+ bilat; no bruits  LIJ trialysis catheter C/D/I.  Cor: PMI non-palpable. IRR. IRR tachy  no S3 appreciated. Lungs: Diminished throughout. No wheeze or rhonchi  Abdomen: Morbidly  obese, NT, mild distention (improved), no HSM. No bruits or masses. +BS  Extremities: no cyanosis, clubbing, rash. 1-2+ edema into thighs.  Neuro: Alert & oriented x 3. Cranial nerves grossly intact. Moves all 4 extremities w/o difficulty. Affect pleasant. Overall more comfortable  Telemetry: AF 110-140. Personally reviewed   Labs: CBC  Recent Labs  01/03/17 0353 01/04/17 0510  WBC 8.3 8.5  HGB 8.1* 7.9*  HCT 27.8* 26.4*  MCV 90.0 89.8  PLT 118* 027*   Basic Metabolic Panel  Recent Labs  01/03/17 0353 01/03/17 1644 01/04/17 0510  NA 136 135 136  K 4.2 4.3 3.9  CL 99* 98* 99*  CO2 26 25 26   GLUCOSE 98 92 94  BUN 11 9 9   CREATININE 1.70* 1.72* 1.77*  CALCIUM 8.6* 8.4* 8.5*  MG 2.5*  --  2.3  PHOS 2.7 3.1 3.6    BNP: BNP (last 3 results)  Recent Labs  09/18/16 1126 11/07/16 1633 12/26/16 1643  BNP 991.0* 490.4* 1,116.0*    Medications:     Scheduled Medications: . bisacodyl  10 mg Oral Daily  . Chlorhexidine Gluconate Cloth  6 each Topical Daily  . darbepoetin (ARANESP) injection - NON-DIALYSIS  150 mcg Subcutaneous Q  Tue-1800  . ferrous sulfate  325 mg Oral TID WC  . ferumoxytol  510 mg Intravenous Weekly  . mouth rinse  15 mL Mouth Rinse BID  . oxybutynin  5 mg Oral BID  . senna-docusate  2 tablet Oral QHS  . sodium chloride flush  10-40 mL Intracatheter Q12H  . sodium chloride flush  3 mL Intravenous Q12H  . sodium chloride flush  3 mL Intravenous Q12H    Infusions: . sodium chloride Stopped (01/03/17 1400)  . amiodarone 30 mg/hr (01/03/17 2341)  . diltiazem (CARDIZEM) infusion 10 mg/hr (01/04/17 0224)  . heparin 2,350 Units/hr (01/04/17 0651)  . milrinone 0.25 mcg/kg/min (01/04/17 0224)  . dialysis replacement fluid (prismasate) 300 mL/hr at 01/04/17 0659  . dialysis replacement fluid (prismasate) 300 mL/hr at 01/04/17 0659  . dialysate (PRISMASATE) 1,500 mL/hr at 01/04/17 0650    PRN Medications: sodium chloride, acetaminophen,  ALPRAZolam, heparin, ondansetron (ZOFRAN) IV, oxyCODONE, polyethylene glycol, promethazine, sodium chloride, sodium chloride, sodium chloride flush, sodium chloride flush, sodium chloride flush   Assessment/Plan   1. Acute on chronic diastolic CHF 2. Paroxysmal atrial fibrillation on Coumadin 3. Acute on chronic kidney disease stage IV 4. Obesity hypoventilation syndrome.  5. Hyperkalemia 6. Acute RV failure/cor pulmonale 7. OSA, noncompliant with CPAP 8. Acute on chronic respiratory failure 9. Anemia 10. NSVT  She is down 70 + lbs with CVVHD and IV diuresis.   Coox  57.5% on milrinone 0.25 mcg/kg/min. Consider RHC once optimized.   Got 2u RBCs 3/24. hgb 7.5-> 8.3-> 8.1 -> 7.9. No overt bleeding. Continue to follow closely.  May need further transfusion. Continue PPI.  Remains in AF RVR on amio and diltiazem. On heparin, coumadin on hold.  Plan for DCCV WITHOUT TEE this am, as she has not been without coverage.    Goal K > 4.0 and Mg 2.0 with occasional NSVT. K 3.9 and Mg 2.3 this am.   Length of Stay: [address] [place]  01/04/2017, 8:14 AM  Advanced Heart Failure Team  Pager [phone] M-F 7am-4pm.  Please contact [facility] for night-coverage after hours (4p -7a ) and weekends on [email or web address]    Agree with above.   She is improving but remains tenuous. Volume much improved ith CVVHD. CXR getting better. Now on high-flow O2 and needing bipap less. Not making much urine. Remains in AF with RVR and continues to have brief NSVT despite amio gtt.   Co-ox marginal on milrinone.   Will continue volume removal with CVVHD until euvolemic then will mean milrinone.as tolerated. Will likely need RHC prior to d/c.   Plan DC-CV today. Discussed with CCM who will provide support.   Hgb trending down. No obvious sites of bleeding.   The patient is critically ill with multiple organ systems failure and requires high complexity decision making for assessment and  support, frequent evaluation and titration of therapies, application of advanced monitoring technologies and extensive interpretation of multiple databases.   Critical Care Time devoted to patient care services described in this note is 35 Minutes. This time reflects time of care of this signee, Dr. Pierre Bali who personally examined the patient and determined the plan of care. This critical care time does not reflect procedure time, teaching time or supervisory time of our PA/NP/resident but could involve care discussion and coordination time.  Glori Bickers, MD

## 2017-01-04 NOTE — Progress Notes (Signed)
ANTICOAGULATION CONSULT NOTE - Follow Up Consult  Pharmacy Consult for Heparin Indication: atrial fibrillation  Patient Measurements: Height: 5' 7.5" (171.5 cm) Weight: (!) 335 lb (152 kg) IBW/kg (Calculated) : 62.75 Heparin Dosing Weight: 111kg  Vital Signs: Temp: 98.7 F (37.1 C) (03/27 1600) Temp Source: Oral (03/27 1600) BP: 101/34 (03/27 1700) Pulse Rate: 119 (03/27 1700)  Labs:  Recent Labs  01/02/17 0326  01/03/17 0353 01/03/17 1644 01/04/17 0510 01/04/17 0610 01/04/17 0855 01/04/17 1621  HGB 8.3*  --  8.1*  --  7.9*  --   --   --   HCT 27.6*  --  27.8*  --  26.4*  --   --   --   PLT 113*  --  118*  --  110*  --   --   --   HEPARINUNFRC 0.48  --  0.39  --   --  0.13* 0.14* 0.30  CREATININE 1.75*  < > 1.70* 1.72* 1.77*  --   --  1.86*  < > = values in this interval not displayed.  Estimated Creatinine Clearance: 51.3 mL/min (A) (by C-G formula based on SCr of 1.86 mg/dL (H)).   Assessment: 60yof on coumadin pta for afib, admitted with volume overload and renal failure. INR 2.84 on admit.  Heparin level is now therapeutic at 0.3 after most recent rate adjustment to 2500 units/hr. No bleeding issues noted.    Goal of Therapy:  INR 2-3 Heparin level 0.3-0.7 units/ml Monitor platelets by anticoagulation protocol: Yes   Plan:  Continue heparin drip at 2500 uts/hr Obtain confirmation heparin level in 6 hours  Daily HL, CBC, Protime F/u oral AC  Vincenza Hews, PharmD, BCPS 01/04/2017, 5:38 PM

## 2017-01-04 NOTE — Procedures (Signed)
Conscious Sedation For Cardioversion  Used 200 mcg of propofol and 20 mg of etomidate for cardioversion x2.  Patient was bag masked ventilated during the procedure.  We monitored HR, RR, O2 sat and BP throughout the procedure with no events.  Post procedure, patient was awake and interactive, moving all ext to command, protecting airway and with stable vital signs.  Recovered in the ICU.  Total time of 30 minutes.  Rush Farmer, M.D. [facility] Pulmonary/Critical Care Medicine. Pager: [phone]. After hours pager: [phone].

## 2017-01-04 NOTE — Progress Notes (Signed)
[facility] Rounding Note Subjective:    Continues on CRRT (initiated 3/21) Weight is down from 187.8 kg (414 lb) on 3/21 to today's weight of 152 kg Making minimal if any urine CVP down to 8 Still pulm edema on CXR Still in AF with RVR despite amio/dilt    Objective Vital signs in last 24 hours: Vitals:   01/04/17 1100 01/04/17 1137 01/04/17 1200 01/04/17 1228  BP: 96/64 115/68 (!) 87/47 (!) 92/55  Pulse: (!) 125 (!) 116 (!) 115 (!) 112  Resp: (!) 22 18 (!) 28 (!) 23  Temp:  98.5 F (36.9 C)    TempSrc:  Oral    SpO2: 94% 94% 100% 99%  Weight:      Height:       Weight change: -6.804 kg (-15 lb)  Intake/Output Summary (Last 24 hours) at 01/04/17 1247 Last data filed at 01/04/17 1142  Gross per 24 hour  Intake           1715.6 ml  Output             8213 ml  Net          -6497.4 ml    Physical Exam: MO AAF VS as noted Left IJ vascath placed 3/21 in use for CRRT [place], wearing nasal cannula Still w/mild tachypnea Tachy 120's AFib Anteriorly clear Pitting edema of abd wall, post thighs. LE's below the knees all at least 2+ but improving  Labs:   Recent Labs Lab 01/03/17 0353 01/03/17 1644 01/04/17 0510  NA 136 135 136  K 4.2 4.3 3.9  CL 99* 98* 99*  CO2 26 25 26   GLUCOSE 98 92 94  BUN 11 9 9   CREATININE 1.70* 1.72* 1.77*  CALCIUM 8.6* 8.4* 8.5*  PHOS 2.7 3.1 3.6     Recent Labs Lab 01/03/17 0353 01/03/17 1644 01/04/17 0510  ALBUMIN 3.0* 2.9* 2.8*   Iron/TIBC/Ferritin/ %Sat    Component Value Date/Time   IRON 64 12/29/2016 0444   TIBC 274 12/29/2016 0444   FERRITIN 115 12/29/2016 0444   IRONPCTSAT 23 12/29/2016 0444     Recent Labs Lab 12/31/16 0332 01/01/17 0300 01/02/17 0326 01/03/17 0353 01/04/17 0510  WBC 8.2 7.5 8.5 8.3 8.5  HGB 7.6* 7.5* 8.3* 8.1* 7.9*  HCT 26.6* 26.7* 27.6* 27.8* 26.4*  MCV 89.9 89.9 88.7 90.0 89.8  PLT 146* 190 113* 118* 110*   Studies/Results: Dg Chest Port 1 View  Result Date: 01/04/2017 CLINICAL  DATA:  Pulmonary edema. EXAM: PORTABLE CHEST 1 VIEW COMPARISON:  01/02/2017. FINDINGS: Left IJ line, right subclavian line in stable position. Stable cardiomegaly. Diffuse bilateral pulmonary infiltrates consistent with pulmonary edema again noted. Bilateral pneumonia cannot be excluded. Small left pleural effusion again noted. IMPRESSION: 1.  Central lines in stable position. 2. Persistent cardiomegaly with diffuse bilateral pulmonary infiltrates consistent pulmonary edema. Small left pleural effusion. These findings consistent CHF. Bilateral pneumonia cannot be excluded. No change from prior exam. Electronically Signed   By: [place]   On: 01/04/2017 07:17   Medications: Infusions: . sodium chloride Stopped (01/03/17 1400)  . amiodarone 30 mg/hr (01/04/17 1140)  . diltiazem (CARDIZEM) infusion 10 mg/hr (01/04/17 1139)  . heparin 2,500 Units/hr (01/04/17 1108)  . milrinone 0.25 mcg/kg/min (01/04/17 0856)  . dialysis replacement fluid (prismasate) 300 mL/hr at 01/04/17 0659  . dialysis replacement fluid (prismasate) 300 mL/hr at 01/04/17 0659  . dialysate (PRISMASATE) 1,500 mL/hr at 01/04/17 1025    Scheduled Medications: . bisacodyl  10  mg Oral Daily  . Chlorhexidine Gluconate Cloth  6 each Topical Daily  . darbepoetin (ARANESP) injection - NON-DIALYSIS  150 mcg Subcutaneous Q Tue-1800  . ferrous sulfate  325 mg Oral TID WC  . ferumoxytol  510 mg Intravenous Weekly  . mouth rinse  15 mL Mouth Rinse BID  . oxybutynin  5 mg Oral BID  . senna-docusate  2 tablet Oral QHS  . sodium chloride flush  10-40 mL Intracatheter Q12H  . sodium chloride flush  3 mL Intravenous Q12H  . sodium chloride flush  3 mL Intravenous Q12H   Background: medical history of morbid obesity / wheelchair bound, untreated OHS, chronic combined systolic / diastolic CHF, NICM, AF on coumadin, HTN, 3L O2 dependent, pulmonary hypertension, former smoker (11 pk years) with COPD & CKD III. Left fibular fracture late  2017.  She was recently admitted from 1/28-11/13/16 for acute CHF exacerbation and presents again 12/27/16 with recurrent congestive heart failure (RV failure)/AF and acute on chronic renal failure. CRRT initiated 3/21 for volume overload. (Baseline creatinine 1.6-1.9 w/intermittent prior episodes AKI on CKD, creatinines as high as 4.4 on prior admissions). Creatinine 3.46 at initiation of CRRT.   Assessment/Plan:   1. Renal- AKI on CKD  (creatinine on 2/3 was 1.97)- most likely due to cardiorenal syndrome/hemodynamic etiology as UA is fairly bland and renal ultrasound normal. Failed milrinone/diuretics in an ICU setting so CRRT initiated 3/21. Good results so far but still fluid to go. CXR 3/27 still shows pulmonary edema. Making little if any urine now. Conceivable (in fact most likely) that if we get her dry enough to breathe, she will require chronic dialysis (she is aware and says "whatever it takes") 2. Vol overload/anasarca  - was unable to diurese on milrinone. Now vol much improved w/CRRT but making very little urine. Still has pitting edema LE's 3+. abd wall 2+. Will need CRRT for several more days 3. Anemia  - Transfused 3/24, 3/25. Got Feraheme 3/21 for low tsat and is on Aranesp 150 QTuesday   4. Hyperkalemia-  resolved  5. Hypophosphatemia- repleting w/IV Na phos 30 BID 6. AonC dCHF - milronone/volume removal 7. RV failure - for RHC once vol optimized 8. AF/RVR - amio/dilt 9. OSA/OHV - requiring BIPAP for sleep  Jamal Maes, MD [facility] [phone] Pager 01/04/2017, 12:47 PM

## 2017-01-04 NOTE — Progress Notes (Signed)
PULMONARY / CRITICAL CARE MEDICINE   Name: Paige Marshall MRN: [id number] DOB: April 19, 1956    ADMISSION DATE:  12/26/2016 CONSULTATION DATE:  12/30/16  REFERRING MD:  Dr. Tommy Medal   CHIEF COMPLAINT:  Shortness of breath   HISTORY OF PRESENT ILLNESS:  61 y/o F admitted from rehab facility (resident since November 2017) on 3/18 with complaints of shortness of breath & lower extremity swelling.   The patient has a medical history of morbid obesity / wheelchair bound, untreated OHS, chronic combined systolic / diastolic CHF, NICM, AF on coumadin, HTN, 3L O2 dependent, pulmonary hypertension, former smoker (11 pk years) with COPD & CKD III.  The patient suffered a left fibular fracture in late 2017.  She was recently admitted from 1/28-11/13/16 for acute CHF exacerbation.  At that time, her discharge weight was 370lbs.    She returned from the rehab facility on 3/18 with increased SOB and lower extremity swelling.  She if followed by Dr. Gwenlyn Found for cardiology and was recently switched to lasix and metolazone.  Lasix was discontinued at the SNF due to rising sr cr.  Her admit weight was 411lbs.  She was admitted by IMTS for decompensated CHF.  She developed worsening AKI with concern for cardio-renal syndrome.  An HD catheter was inserted 3/21 and CVVHD was initiated.   She remains on milrinone, amiodarone and heparin gtt's.  Rate control has been difficult to achieve.  Over the past 24 hours, she is net negative 3.5L.  She developed respiratory distress am of 3/22 and BiPAP was initiated.    PCCM consulted for evaluation.   SUBJECTIVE:  Required BiPAP overnight for increase WOB and in a-fib with RVR this AM with stable BP, remains on CRRT  VITAL SIGNS: BP (!) 141/107   Pulse (!) 114   Temp 98.5 F (36.9 C) (Oral)   Resp (!) 22   Ht 5' 7.5" (1.715 m)   Wt (!) 152 kg (335 lb)   LMP 09/07/2011   SpO2 96%   BMI 51.69 kg/m   HEMODYNAMICS: CVP:  [10 mmHg-15 mmHg] 15 mmHg  VENTILATOR  SETTINGS: FiO2 (%):  [50 %] 50 %  INTAKE / OUTPUT: I/O last 3 completed shifts: In: 4530.3 [P.O.:550; I.V.:3194.3; IV Piggyback:786] Out: 91638 [Other:12557]  PHYSICAL EXAMINATION: Gen:      No acute distress, morbid obesity, off BiPAP but on HFNC HEENT:  EOMI, sclera anicteric Neck:      No masses; no thyromegaly Lungs:    Distant BS bilaterally with bibasilar crackles CV:         Regular rate and rhythm; no murmurs Abd:      Soft, NT, ND and +BS Ext:    1-2 + edema; adequate peripheral perfusion Skin:       Warm and dry; no rash Neuro:     Alert and oriented x 3  LABS:  BMET  Recent Labs Lab 01/03/17 0353 01/03/17 1644 01/04/17 0510  NA 136 135 136  K 4.2 4.3 3.9  CL 99* 98* 99*  CO2 26 25 26   BUN 11 9 9   CREATININE 1.70* 1.72* 1.77*  GLUCOSE 98 92 94   Electrolytes  Recent Labs Lab 01/02/17 0326  01/03/17 0353 01/03/17 1644 01/04/17 0510  CALCIUM 8.7*  < > 8.6* 8.4* 8.5*  MG 2.7*  --  2.5*  --  2.3  PHOS 1.3*  1.3*  < > 2.7 3.1 3.6  < > = values in this interval not displayed.  CBC  Recent Labs Lab 01/02/17 0326 01/03/17 0353 01/04/17 0510  WBC 8.5 8.3 8.5  HGB 8.3* 8.1* 7.9*  HCT 27.6* 27.8* 26.4*  PLT 113* 118* 110*   Coag's  Recent Labs Lab 12/29/16 0444 12/30/16 0500 12/31/16 0332  INR 1.92 1.43 1.32   Sepsis Markers No results for input(s): LATICACIDVEN, PROCALCITON, O2SATVEN in the last 168 hours.  ABG  Recent Labs Lab 12/30/16 0922 12/30/16 0938  PHART 7.406 7.414  PCO2ART 50.5* 47.8  PO2ART 31.0* 48.0*   Liver Enzymes  Recent Labs Lab 01/03/17 0353 01/03/17 1644 01/04/17 0510  ALBUMIN 3.0* 2.9* 2.8*   Cardiac Enzymes No results for input(s): TROPONINI, PROBNP in the last 168 hours.  Glucose No results for input(s): GLUCAP in the last 168 hours.  Imaging Dg Chest Port 1 View  Result Date: 01/04/2017 CLINICAL DATA:  Pulmonary edema. EXAM: PORTABLE CHEST 1 VIEW COMPARISON:  01/02/2017. FINDINGS: Left IJ  line, right subclavian line in stable position. Stable cardiomegaly. Diffuse bilateral pulmonary infiltrates consistent with pulmonary edema again noted. Bilateral pneumonia cannot be excluded. Small left pleural effusion again noted. IMPRESSION: 1.  Central lines in stable position. 2. Persistent cardiomegaly with diffuse bilateral pulmonary infiltrates consistent pulmonary edema. Small left pleural effusion. These findings consistent CHF. Bilateral pneumonia cannot be excluded. No change from prior exam. Electronically Signed   By: Marcello Moores  Register   On: 01/04/2017 07:17   STUDIES:  US Renal 3/18 >> normal appearance of kidneys, moderate lower abdominal ascites CXR 3/22>> unchanged edema, bilateral effusions. I have reviewed the images personally.  CULTURES: None  ANTIBIOTICS: None  SIGNIFICANT EVENTS: 3/18  Admit with SOB, increased LE swelling > decompensated CHF  3/21  CVVHD initiated  3/22  PCCM consulted for respiratory distress   LINES/TUBES: R [place] TLC 3/19 >>  L IJ HD Cath 3/21 >>   I reviewed CXR mylself, cardiomegally and pulmonary edema noted.  DISCUSSION: 61 y/o F admitted with decompensated CHF, volume overload, AKI with concern for cardiorenal syndrome.  CVVHD in progress.  Tolerating NIV/Bipap.   ASSESSMENT / PLAN:  PULMONARY A: Acute on Chronic Hypoxemic Hypercapneic Respiratory Failure 2/2 Volume overload/Pulmonary edema + CHF exacerbation pEF + OSA + OHS + Pulm HTN Pt with h/o asthma/copd but it is not in exacerbation P:   - Continue BiPAP as needed during the day and mandatory at night - Monitor for intubation risk - Conscious sedation for cardioversion today  CARDIOVASCULAR A:  AF with RVR - on coumadin  Acute on Chronic Diastolic CHF with Volume Overload Acute RV Failure / Cor Pulmonale  HTN  P:  - On Amiodarone, Cardizem and heparin gtt - Cardioversion today - Would consider lopressors since post cardioversion patient is in MATs  RENAL A:    Acute on Chronic Renal Failure - suspect cardiorenal syndrome, renal US normal Hyperkalemia P:   - Continue volume removal with CVVH. CVP is still elevated - Replete lytes as indicated - CRRT per renal, continue volume negative - BMET in AM - Replace electrolytes as indicated  GASTROINTESTINAL A:   Morbid Obesity  P:   - PO diet - Heart healthy, carb modified diet - Fluid restriction  HEMATOLOGIC A:   Anemia - suspect in setting of CKD + frequent phlebotomy  Got 2 units PRBC per cardiology 3/24. No evidence of bleed. Chronic Anticoagulation P:  - Follow CBC - Continue heparin anticoagulation  INFECTIOUS A:   No acute infectious process  P:   - Observe off antibiotics  ENDOCRINE  A:   No acute issues  P:   - Follow glucose on BMP  NEUROLOGIC A:   Anxiety  P:   - PRN Xanax for anxiety.  FAMILY  - Updates: Patient and husband updated bedside. - Inter-disciplinary family meet or Palliative Care meeting due by:  3/29  The patient is critically ill with multiple organ systems failure and requires high complexity decision making for assessment and support, frequent evaluation and titration of therapies, application of advanced monitoring technologies and extensive interpretation of multiple databases.   Critical Care Time devoted to patient care services described in this note is  35  Minutes. This time reflects time of care of this signee Dr Jennet Maduro. This critical care time does not reflect procedure time, or teaching time or supervisory time of PA/NP/Med student/Med Resident etc but could involve care discussion time.  Rush Farmer, M.D. [facility] Pulmonary/Critical Care Medicine. Pager: [phone]. After hours pager: [phone].  01/04/2017, 10:30 AM

## 2017-01-04 NOTE — CV Procedure (Signed)
    DIRECT CURRENT CARDIOVERSION  NAME:  Paige Marshall   MRN: [id number] DOB:  1956/03/19   ADMIT DATE: 12/26/2016   INDICATIONS: Atrial fibrillation    PROCEDURE:   Informed consent was obtained prior to the procedure. The risks, benefits and alternatives for the procedure were discussed and the patient comprehended these risks. Once an appropriate time out was taken, the patient had the defibrillator pads placed in the anterior and posterior position. The patient then underwent sedation by the CCM service. Once an appropriate level of sedation was achieved, the patient received a two separate  biphasic, synchronized 200J shock with conversion to apparent MAT. No apparent complications.  Glori Bickers, MD  10:14 PM

## 2017-01-05 LAB — RENAL FUNCTION PANEL
ALBUMIN: 2.8 g/dL — AB (ref 3.5–5.0)
Albumin: 2.6 g/dL — ABNORMAL LOW (ref 3.5–5.0)
Anion gap: 12 (ref 5–15)
Anion gap: 10 (ref 5–15)
BUN: 9 mg/dL (ref 6–20)
BUN: 10 mg/dL (ref 6–20)
CALCIUM: 8.2 mg/dL — AB (ref 8.9–10.3)
CHLORIDE: 100 mmol/L — AB (ref 101–111)
CHLORIDE: 100 mmol/L — AB (ref 101–111)
CO2: 25 mmol/L (ref 22–32)
CO2: 27 mmol/L (ref 22–32)
CREATININE: 1.75 mg/dL — AB (ref 0.44–1.00)
CREATININE: 1.78 mg/dL — AB (ref 0.44–1.00)
Calcium: 8.7 mg/dL — ABNORMAL LOW (ref 8.9–10.3)
GFR calc Af Amer: 36 mL/min — ABNORMAL LOW (ref >60)
GFR calc non Af Amer: 31 mL/min — ABNORMAL LOW (ref >60)
GFR, EST AFRICAN AMERICAN: 35 mL/min — AB (ref >60)
GFR, EST NON AFRICAN AMERICAN: 30 mL/min — AB (ref >60)
Glucose, Bld: 77 mg/dL (ref 65–99)
Glucose, Bld: 104 mg/dL — ABNORMAL HIGH (ref 65–99)
PHOSPHORUS: 1.7 mg/dL — AB (ref 2.5–4.6)
POTASSIUM: 5.1 mmol/L (ref 3.5–5.1)
Phosphorus: 1.9 mg/dL — ABNORMAL LOW (ref 2.5–4.6)
Potassium: 4 mmol/L (ref 3.5–5.1)
SODIUM: 137 mmol/L (ref 135–145)
Sodium: 137 mmol/L (ref 135–145)

## 2017-01-05 LAB — PHOSPHORUS: Phosphorus: 1.8 mg/dL — ABNORMAL LOW (ref 2.5–4.6)

## 2017-01-05 LAB — COOXEMETRY PANEL
CARBOXYHEMOGLOBIN: 2.7 % — AB (ref 0.5–1.5)
METHEMOGLOBIN: 1.3 % (ref 0.0–1.5)
O2 SAT: 58.6 %
TOTAL HEMOGLOBIN: 8.4 g/dL — AB (ref 12.0–16.0)

## 2017-01-05 LAB — CBC
HEMATOCRIT: 27.9 % — AB (ref 36.0–46.0)
Hemoglobin: 8 g/dL — ABNORMAL LOW (ref 12.0–15.0)
MCH: 25.6 pg — ABNORMAL LOW (ref 26.0–34.0)
MCHC: 28.7 g/dL — ABNORMAL LOW (ref 30.0–36.0)
MCV: 89.1 fL (ref 78.0–100.0)
PLATELETS: 127 10*3/uL — AB (ref 150–400)
RBC: 3.13 MIL/uL — AB (ref 3.87–5.11)
RDW: 27.4 % — AB (ref 11.5–15.5)
WBC: 7.9 10*3/uL (ref 4.0–10.5)

## 2017-01-05 LAB — GLUCOSE, CAPILLARY: Glucose-Capillary: 111 mg/dL — ABNORMAL HIGH (ref 65–99)

## 2017-01-05 LAB — HEPARIN LEVEL (UNFRACTIONATED)
Heparin Unfractionated: 0.23 IU/mL — ABNORMAL LOW (ref 0.30–0.70)
Heparin Unfractionated: 0.17 IU/mL — ABNORMAL LOW (ref 0.30–0.70)

## 2017-01-05 LAB — MAGNESIUM: Magnesium: 2.2 mg/dL (ref 1.7–2.4)

## 2017-01-05 MED ORDER — METOPROLOL TARTRATE 5 MG/5ML IV SOLN
2.5000 mg | Freq: Once | INTRAVENOUS | Status: AC
  Administered 2017-01-05: 2.5 mg via INTRAVENOUS
  Filled 2017-01-05: qty 5

## 2017-01-05 MED ORDER — IVABRADINE HCL 5 MG PO TABS
5.0000 mg | ORAL_TABLET | Freq: Two times a day (BID) | ORAL | Status: DC
  Administered 2017-01-05 (×2): 5 mg via ORAL
  Filled 2017-01-05 (×3): qty 1

## 2017-01-05 MED ORDER — SODIUM PHOSPHATES 45 MMOLE/15ML IV SOLN
30.0000 mmol | Freq: Every day | INTRAVENOUS | Status: DC
  Administered 2017-01-05: 30 mmol via INTRAVENOUS
  Filled 2017-01-05 (×2): qty 10

## 2017-01-05 MED ORDER — HEPARIN (PORCINE) IN NACL 100-0.45 UNIT/ML-% IJ SOLN
3200.0000 [IU]/h | INTRAMUSCULAR | Status: DC
  Administered 2017-01-05 (×2): 2850 [IU]/h via INTRAVENOUS
  Administered 2017-01-06: 3200 [IU]/h via INTRAVENOUS
  Filled 2017-01-05 (×2): qty 250

## 2017-01-05 MED ORDER — HEPARIN BOLUS VIA INFUSION
2000.0000 [IU] | Freq: Once | INTRAVENOUS | Status: AC
  Administered 2017-01-05: 2000 [IU] via INTRAVENOUS
  Filled 2017-01-05: qty 2000

## 2017-01-05 NOTE — Progress Notes (Signed)
   01/05/17 0011  BiPAP/CPAP/SIPAP  BiPAP/CPAP/SIPAP Pt Type Adult  Mask Type Full face mask  Mask Size Large  Set Rate 12 breaths/min  Respiratory Rate 22 breaths/min  IPAP 10 cmH20  EPAP 5 cmH2O  Oxygen Percent 40 %  Flow Rate 0 lpm  Minute Ventilation 16.3  Leak 10  Peak Inspiratory Pressure (PIP) 11  Tidal Volume (Vt) 688  BiPAP/CPAP/SIPAP BiPAP  Patient Home Equipment No  Auto Titrate No  Press High Alarm 25 cmH2O  Press Low Alarm 5 cmH2O  BiPAP/CPAP /SiPAP Vitals  Pulse Rate (!) 127  Resp (!) 22  SpO2 97 %  Patient placed on Bipap on above settings. She tolerates it very well at this time.

## 2017-01-05 NOTE — Progress Notes (Signed)
PT Cancellation Note  Patient Details Name: Paige Marshall MRN: [id number] DOB: 20-Jun-1956   Cancelled Treatment:    Reason Eval/Treat Not Completed: Medical issues which prohibited therapy. Pt HR in 130's at rest and RN requested hold until tomorrow when she will hopefully be off CRRT and potentially able to get OOB.     [place] 01/05/2017, 3:29 PM

## 2017-01-05 NOTE — Progress Notes (Signed)
Patient was taken off of bipap this morning by RN.  Patient currently resting on high flow nasal cannula.  Will continue to monitor.

## 2017-01-05 NOTE — Progress Notes (Signed)
PULMONARY / CRITICAL CARE MEDICINE   Name: Paige Marshall MRN: [id number] DOB: 04/14/1956    ADMISSION DATE:  12/26/2016 CONSULTATION DATE:  12/30/16  REFERRING MD:  Dr. Tommy Medal   CHIEF COMPLAINT:  Shortness of breath   HISTORY OF PRESENT ILLNESS:  61 y/o F admitted from rehab facility (resident since November 2017) on 3/18 with complaints of shortness of breath & lower extremity swelling.   The patient has a medical history of morbid obesity / wheelchair bound, untreated OHS, chronic combined systolic / diastolic CHF, NICM, AF on coumadin, HTN, 3L O2 dependent, pulmonary hypertension, former smoker (11 pk years) with COPD & CKD III.  The patient suffered a left fibular fracture in late 2017.  She was recently admitted from 1/28-11/13/16 for acute CHF exacerbation.  At that time, her discharge weight was 370lbs.    She returned from the rehab facility on 3/18 with increased SOB and lower extremity swelling.  She if followed by Dr. Gwenlyn Found for cardiology and was recently switched to lasix and metolazone.  Lasix was discontinued at the SNF due to rising sr cr.  Her admit weight was 411lbs.  She was admitted by IMTS for decompensated CHF.  She developed worsening AKI with concern for cardio-renal syndrome.  An HD catheter was inserted 3/21 and CVVHD was initiated.   She remains on milrinone, amiodarone and heparin gtt's.  Rate control has been difficult to achieve.  Over the past 24 hours, she is net negative 3.5L.  She developed respiratory distress am of 3/22 and BiPAP was initiated.    PCCM consulted for evaluation.   SUBJECTIVE:  BiPAP at night, continues to desat when flat  VITAL SIGNS: BP (!) 108/54   Pulse (!) 129   Temp 98 F (36.7 C) (Oral)   Resp (!) 23   Ht 5' 7.5" (1.715 m)   Wt (!) 140.6 kg (310 lb)   LMP 09/07/2011   SpO2 95%   BMI 47.84 kg/m   HEMODYNAMICS: CVP:  [6 mmHg-10 mmHg] 6 mmHg  VENTILATOR SETTINGS: FiO2 (%):  [40 %] 40 %  INTAKE / OUTPUT: I/O last 3  completed shifts: In: 2718.3 [P.O.:180; I.V.:2270.3; IV Piggyback:268] Out: 10175 [Other:11764]  PHYSICAL EXAMINATION: Gen:      No acute distress, morbid obesity, off BiPAP but on HFNC at 8L [place] HEENT:  EOMI, sclera anicteric Neck:      No masses; no thyromegaly Lungs:    Distant BS bilaterally with bibasilar crackles CV:         Regular rate and rhythm; no murmurs Abd:      Soft, NT, ND and +BS Ext:    Edema improving; adequate peripheral perfusion Skin:       Warm and dry; no rash Neuro:     Alert and oriented x 3  LABS:  BMET  Recent Labs Lab 01/04/17 0510 01/04/17 1621 01/05/17 0355  NA 136 135 137  K 3.9 4.2 4.0  CL 99* 98* 100*  CO2 26 26 25   BUN 9 10 9   CREATININE 1.77* 1.86* 1.75*  GLUCOSE 94 96 77   Electrolytes  Recent Labs Lab 01/03/17 0353  01/04/17 0510 01/04/17 1621 01/05/17 0355  CALCIUM 8.6*  < > 8.5* 8.6* 8.2*  MG 2.5*  --  2.3  --  2.2  PHOS 2.7  < > 3.6 2.5 1.9*  1.8*  < > = values in this interval not displayed.  CBC  Recent Labs Lab 01/03/17 0353 01/04/17 0510 01/05/17 0800  WBC 8.3 8.5 7.9  HGB 8.1* 7.9* 8.0*  HCT 27.8* 26.4* 27.9*  PLT 118* 110* PENDING   Coag's  Recent Labs Lab 12/30/16 0500 12/31/16 0332  INR 1.43 1.32   Sepsis Markers No results for input(s): LATICACIDVEN, PROCALCITON, O2SATVEN in the last 168 hours.  ABG  Recent Labs Lab 12/30/16 0922 12/30/16 0938  PHART 7.406 7.414  PCO2ART 50.5* 47.8  PO2ART 31.0* 48.0*   Liver Enzymes  Recent Labs Lab 01/04/17 0510 01/04/17 1621 01/05/17 0355  ALBUMIN 2.8* 2.8* 2.6*   Cardiac Enzymes No results for input(s): TROPONINI, PROBNP in the last 168 hours.  Glucose No results for input(s): GLUCAP in the last 168 hours.  Imaging No results found. STUDIES:  US Renal 3/18 >> normal appearance of kidneys, moderate lower abdominal ascites CXR 3/22>> unchanged edema, bilateral effusions. I have reviewed the images  personally.  CULTURES: None  ANTIBIOTICS: None  SIGNIFICANT EVENTS: 3/18  Admit with SOB, increased LE swelling > decompensated CHF  3/21  CVVHD initiated  3/22  PCCM consulted for respiratory distress   LINES/TUBES: R [place] TLC 3/19 >>  L IJ HD Cath 3/21 >>   I reviewed CXR mylself, cardiomegally and pulmonary edema noted.  DISCUSSION: 61 y/o F admitted with decompensated CHF, volume overload, AKI with concern for cardiorenal syndrome.  CVVHD in progress.  Tolerating NIV/Bipap.   ASSESSMENT / PLAN:  PULMONARY A: Acute on Chronic Hypoxemic Hypercapneic Respiratory Failure 2/2 Volume overload/Pulmonary edema + CHF exacerbation pEF + OSA + OHS + Pulm HTN Pt with h/o asthma/copd but it is not in exacerbation P:   - Continue BiPAP at night and PRN - HFNC at 10 liters, continues to desat when flat. - Conscious sedation for cardioversion today  CARDIOVASCULAR A:  AF with RVR - on coumadin  Acute on Chronic Diastolic CHF with Volume Overload Acute RV Failure / Cor Pulmonale  HTN  P:  - On Cardizem and heparin gtt - Milrinone - [place] per cards - Would consider lopressors since post cardioversion patient is in MATs  RENAL A:   Acute on Chronic Renal Failure - suspect cardiorenal syndrome, renal US normal Hyperkalemia P:   - Continue volume removal with CVVH.  - Replete lytes as indicated - CVVH for one more day - BMET in AM - Replace electrolytes as indicated  GASTROINTESTINAL A:   Morbid Obesity  P:   - PO diet - Heart healthy, carb modified diet - Fluid restriction  HEMATOLOGIC A:   Anemia - suspect in setting of CKD + frequent phlebotomy  Got 2 units PRBC per cardiology 3/24. No evidence of bleed. Chronic Anticoagulation P:  - Follow CBC - Continue heparin anticoagulation  INFECTIOUS A:   No acute infectious process  P:   - Observe off antibiotics  ENDOCRINE A:   No acute issues  P:   - Follow glucose on BMP  NEUROLOGIC A:   Anxiety   P:   - PRN Xanax for anxiety.  FAMILY  - Updates: Patient updated bedside  - Inter-disciplinary family meet or Palliative Care meeting due by:  3/29  The patient is critically ill with multiple organ systems failure and requires high complexity decision making for assessment and support, frequent evaluation and titration of therapies, application of advanced monitoring technologies and extensive interpretation of multiple databases.   Critical Care Time devoted to patient care services described in this note is  35  Minutes. This time reflects time of care of this signee Dr Michaelene Song  Nelda [place]se[place]. This critical care time does not reflect procedure time, or teaching time or supervisory time of PA/NP/Med student/Med Resident etc but could involve care discussion time.  Rush Farmer, M.D. [facility] Pulmonary/Critical Care Medicine. Pager: [phone]. After hours pager: [phone].  01/05/2017, 10:18 AM

## 2017-01-05 NOTE — Progress Notes (Signed)
Advanced Heart Failure Rounding Note  PCP: EDWARDS, Milford Cage, NP  Primary Cardiologist: Dr. Gwenlyn Found   Subjective:    Admitted with volume overload, RV failure. Milrinone 0.74mcg started 3/19. Started CRRT 12/29/16.   Remains on CVVHD pulling 250-300 mL/hr. Negative 5.8 L via CVVHD. Anuric. ZERO urine output recorded. Weight shows down another 25 lbs, but ? Accuracy. This would be a total of 103 lbs down.    Coox 58.6% this am on milrinone 0.25 mcg/kg/min.  CVP 7-8  Feeling good this am.  Denies SOB, CP, lightheadedness or dizziness. Hasn't noticed much of a difference yesterday since DCCV.   No bleeding on heparin.  s/p DCCV yesterday from Afib to what appeared/appears to be MAT.  Having NSVT overnight.   Hgb 7.9 s/p 2u PRBCs on 3/24 . No overt bleeding.   Weight down almost 100 pounds total. Volume status much better. CVP now 8-9. Renal placing tunneled cath for possiblility of long-term HD. s/p DC-CV yesterday. Looks like MAT today. On heparin.    Objective:   Weight Range: (!) 310 lb (140.6 kg) Body mass index is 47.84 kg/m.   Vital Signs:   Temp:  [98 F (36.7 C)-98.7 F (37.1 C)] 98 F (36.7 C) (03/28 0744) Pulse Rate:  [56-136] 129 (03/28 0700) Resp:  [17-32] 17 (03/28 0600) BP: (82-141)/(34-115) 113/59 (03/28 0700) SpO2:  [87 %-100 %] 99 % (03/28 0700) FiO2 (%):  [40 %] 40 % (03/28 0045) Weight:  [310 lb (140.6 kg)] 310 lb (140.6 kg) (03/28 0500) Last BM Date: 01/04/17  Weight change: Filed Weights   01/03/17 0300 01/04/17 0500 01/05/17 0500  Weight: (!) 350 lb (158.8 kg) (!) 335 lb (152 kg) (!) 310 lb (140.6 kg)    Intake/Output:   Intake/Output Summary (Last 24 hours) at 01/05/17 0750 Last data filed at 01/05/17 0700  Gross per 24 hour  Intake          1696.55 ml  Output             7551 ml  Net         -5854.45 ml     Physical Exam: CVP 8-9 Personally reviewed General: Obese, lying in bed. Remains on high-flow 02.  HEENT: Normal, on O2 via  . Anicteric Neck: Supple. JVP difficult due to body habitus. Carotids 2+ bilat; no bruits  LIJ trialysis  Cath    Cor: PMI non-palpable. Distant IRR.  2/6 SEM LSB Lungs: Decreased throughout.  No wheeze Abdomen: Morbidly obese, NT, + distended, no HSM. No bruits or masses. +BS   Extremities: no cyanosis, clubbing, rash. 1+ edema Neuro: Alert & oriented x 3. Cranial nerves grossly intact. Moves all 4 extremities w/o difficulty. Affect pleasant    Telemetry: MAT 110-120s this am.  4-7 beat runs of NSVT overnight. Personally reviewed    Labs: CBC  Recent Labs  01/03/17 0353 01/04/17 0510  WBC 8.3 8.5  HGB 8.1* 7.9*  HCT 27.8* 26.4*  MCV 90.0 89.8  PLT 118* 034*   Basic Metabolic Panel  Recent Labs  01/04/17 0510 01/04/17 1621 01/05/17 0355  NA 136 135 137  K 3.9 4.2 4.0  CL 99* 98* 100*  CO2 26 26 25   GLUCOSE 94 96 77  BUN 9 10 9   CREATININE 1.77* 1.86* 1.75*  CALCIUM 8.5* 8.6* 8.2*  MG 2.3  --  2.2  PHOS 3.6 2.5 1.9*  1.8*    BNP: BNP (last 3 results)  Recent Labs  09/18/16  1126 11/07/16 1633 12/26/16 1643  BNP 991.0* 490.4* 1,116.0*    Medications:     Scheduled Medications: . bisacodyl  10 mg Oral Daily  . Chlorhexidine Gluconate Cloth  6 each Topical Daily  . darbepoetin (ARANESP) injection - NON-DIALYSIS  150 mcg Subcutaneous Q Tue-1800  . ferrous sulfate  325 mg Oral TID WC  . ferumoxytol  510 mg Intravenous Weekly  . mouth rinse  15 mL Mouth Rinse BID  . oxybutynin  5 mg Oral BID  . senna-docusate  2 tablet Oral QHS  . sodium chloride flush  10-40 mL Intracatheter Q12H  . sodium chloride flush  3 mL Intravenous Q12H    Infusions: . amiodarone 30 mg/hr (01/05/17 0046)  . diltiazem (CARDIZEM) infusion 10 mg/hr (01/04/17 2242)  . heparin 2,650 Units/hr (01/05/17 0450)  . milrinone 0.25 mcg/kg/min (01/05/17 0520)  . dialysis replacement fluid (prismasate) 300 mL/hr at 01/05/17 0046  . dialysis replacement fluid (prismasate) 300 mL/hr at  01/05/17 0056  . dialysate (PRISMASATE) 1,500 mL/hr at 01/05/17 0747    PRN Medications: sodium chloride, acetaminophen, ALPRAZolam, heparin, ondansetron (ZOFRAN) IV, oxyCODONE, polyethylene glycol, promethazine, sodium chloride, sodium chloride, sodium chloride flush, sodium chloride flush, sorbitol   Assessment/Plan   1. Acute on chronic diastolic CHF 2. Paroxysmal atrial fibrillation on Coumadin    -s/p DC-CV on 3/27 3. Acute on chronic kidney disease stage IV 4. Obesity hypoventilation syndrome.  5. Hyperkalemia 6. Acute RV failure/cor pulmonale 7. OSA, noncompliant with CPAP 8. Acute on chronic respiratory failure 9. Anemia 10. NSVT  She is down at least 70+ lbs and possibly as much as 100 lbs.  Weights difficult with bed weight.    Need to mobilize once off CVVHD.  Pt anuric, but weight back near baseline. Think she may end up on permanent dialysis.   Coox 58.6% on milrinone 0.25 mcg/kg/min. Plan RHC once optimized, which may be soon.    CBC pending. Hgb has been low s/p 2 UPRB 01/01/17. No overt bleeding.   Now in Sinus tach vs MAT s/p DCCV yesterday. Rates had improved, now closer to 120s. Continue to follow closely.   Electrolytes stable at goal. Goal K > 4.0 and Mg 2.0 with occasional NSVT. K 3.9 and Mg 2.3 this am.   Length of Stay: [place], PA-C  01/05/2017, 7:50 AM  Advanced Heart Failure Team  Pager [phone] M-F 7am-4pm.  Please contact [facility] for night-coverage after hours (4p -7a ) and weekends on [email or web address]   Patient seen and examined with Oda Kilts, PA-C. We discussed all aspects of the encounter. I agree with the assessment and plan as stated above.   She is s/p DCCV yesterday. Rhythm looks like MAT today. BP stable. On heparin.   Volume status much improved. Now down about 100 pounds on CVVHD. Renal planning tunneled cath for possibility of long-term HD. We discussed this.   Continues with NSVT. Follow K and mag.    She will need RHC prior to d/c to assess for PAH  Emilyrose Darrah, Quillian Quince, MD  2:41 PM

## 2017-01-05 NOTE — Progress Notes (Signed)
[facility] Rounding Note Subjective:    Continues on CRRT (initiated 3/21) Weight is down from 187.8 kg (414 lb) on 3/21 to today's weight of 140 kg (if believable) Making minimal if any urine CVP 6 Still pulm edema on CXR 3/27 Cardioverted to [place] with increased WOB with exertion  Objective Vital signs in last 24 hours: Vitals:   01/05/17 0900 01/05/17 1000 01/05/17 1118 01/05/17 1120  BP: (!) 108/54 (!) 105/57  94/66  Pulse: (!) 129   (!) 128  Resp: (!) 23 (!) 24  (!) 26  Temp:   97.8 F (36.6 C)   TempSrc:   Oral   SpO2: 95%   93%  Weight:      Height:       Weight change: -11.34 kg (-25 lb)  Intake/Output Summary (Last 24 hours) at 01/05/17 1146 Last data filed at 01/05/17 1000  Gross per 24 hour  Intake          1624.15 ml  Output             7081 ml  Net         -5456.85 ml    Physical Exam: MO AAF VS as noted Left IJ vascath placed 3/21 in use for CRRT Pleasant, wearing nasal cannula Still w/mild tachypnea Tachy 120's looks like ST on tele Anteriorly clear Pitting edema of abd wall, post thighs. LE's below the knees all at least 2+ but improving  Labs:  Recent Labs Lab 01/04/17 0510 01/04/17 1621 01/05/17 0355  NA 136 135 137  K 3.9 4.2 4.0  CL 99* 98* 100*  CO2 26 26 25   GLUCOSE 94 96 77  BUN 9 10 9   CREATININE 1.77* 1.86* 1.75*  CALCIUM 8.5* 8.6* 8.2*  PHOS 3.6 2.5 1.9*  1.8*     Recent Labs Lab 01/04/17 0510 01/04/17 1621 01/05/17 0355  ALBUMIN 2.8* 2.8* 2.6*   Iron/TIBC/Ferritin/ %Sat    Component Value Date/Time   IRON 64 12/29/2016 0444   TIBC 274 12/29/2016 0444   FERRITIN 115 12/29/2016 0444   IRONPCTSAT 23 12/29/2016 0444     Recent Labs Lab 01/01/17 0300 01/02/17 0326 01/03/17 0353 01/04/17 0510 01/05/17 0800  WBC 7.5 8.5 8.3 8.5 7.9  HGB 7.5* 8.3* 8.1* 7.9* 8.0*  HCT 26.7* 27.6* 27.8* 26.4* 27.9*  MCV 89.9 88.7 90.0 89.8 89.1  PLT 190 113* 118* 110* PENDING   Studies/Results: Dg Chest Port 1 View  Result  Date: 01/04/2017 CLINICAL DATA:  Pulmonary edema. EXAM: PORTABLE CHEST 1 VIEW COMPARISON:  01/02/2017. FINDINGS: Left IJ line, right subclavian line in stable position. Stable cardiomegaly. Diffuse bilateral pulmonary infiltrates consistent with pulmonary edema again noted. Bilateral pneumonia cannot be excluded. Small left pleural effusion again noted. IMPRESSION: 1.  Central lines in stable position. 2. Persistent cardiomegaly with diffuse bilateral pulmonary infiltrates consistent pulmonary edema. Small left pleural effusion. These findings consistent CHF. Bilateral pneumonia cannot be excluded. No change from prior exam. Electronically Signed   By: Marcello Moores  Register   On: 01/04/2017 07:17   Medications: Infusions: . amiodarone 30 mg/hr (01/05/17 0046)  . diltiazem (CARDIZEM) infusion 10 mg/hr (01/05/17 1031)  . heparin 2,650 Units/hr (01/05/17 0450)  . milrinone 0.25 mcg/kg/min (01/05/17 0520)  . dialysis replacement fluid (prismasate) 300 mL/hr at 01/05/17 0046  . dialysis replacement fluid (prismasate) 300 mL/hr at 01/05/17 0056  . dialysate (PRISMASATE) 1,500 mL/hr at 01/05/17 1111    Scheduled Medications: . bisacodyl  10 mg Oral Daily  .  Chlorhexidine Gluconate Cloth  6 each Topical Daily  . darbepoetin (ARANESP) injection - NON-DIALYSIS  150 mcg Subcutaneous Q Tue-1800  . ferrous sulfate  325 mg Oral TID WC  . ferumoxytol  510 mg Intravenous Weekly  . ivabradine  5 mg Oral BID WC  . mouth rinse  15 mL Mouth Rinse BID  . oxybutynin  5 mg Oral BID  . senna-docusate  2 tablet Oral QHS  . sodium chloride flush  10-40 mL Intracatheter Q12H  . sodium chloride flush  3 mL Intravenous Q12H   Background: medical history of morbid obesity / wheelchair bound, untreated OHS, chronic combined systolic / diastolic CHF, NICM, AF on coumadin, HTN, 3L O2 dependent, pulmonary hypertension, former smoker (11 pk years) with COPD & CKD III. Left fibular fracture late 2017.  She was recently admitted  from 1/28-11/13/16 for acute CHF exacerbation and presents again 12/27/16 with recurrent congestive heart failure (RV failure)/AF and acute on chronic renal failure. CRRT initiated 3/21 for volume overload. (Baseline creatinine 1.6-1.9 w/intermittent prior episodes AKI on CKD, creatinines as high as 4.4 on prior admissions). Creatinine 3.46 at initiation of CRRT.   Assessment/Plan:   1. Renal- AKI on CKD  (creatinine on 2/3 was 1.97) 1. Due to cardiorenal syndrome/hemodynamic etiology as UA is fairly bland and renal ultrasound normal.  2. Failed milrinone/diuretics in an ICU setting so CRRT initiated 3/21.  3. Good results so far but still fluid to go. CXR 3/27 still shows pulmonary edema. Making little if any urine now. 4. I think she will be dialysis dependent 5. Needs temp cath changed to [facility] (will ask IR) 2. Vol overload/anasarca  1. Vol much improved w/CRRT  2. Still has pitting edema LE's 3+. abd wall 2+.  3. Will need CRRT for a few  more days 3. Anemia   1. Transfused 3/24, 3/25.  2. Got Feraheme 3/21 for low tsat  3. Aranesp 150 QTuesday   4. Hypophosphatemia 1. Resume IV Na phos 30 mmoles daily 5. AonC dCHF - milronone/volume removal 6. RV failure - for RHC once vol optimized 7. AF/RVR - amio/dilt 8. OSA/OHV - requiring BIPAP for sleep  Jamal Maes, MD [facility] [phone] Pager 01/05/2017, 11:46 AM

## 2017-01-05 NOTE — Progress Notes (Signed)
ANTICOAGULATION CONSULT NOTE - Follow Up Consult  Pharmacy Consult for Heparin (warfarin on hold) Indication: atrial fibrillation  Patient Measurements: Height: 5' 7.5" (171.5 cm) Weight: (!) 335 lb (152 kg) IBW/kg (Calculated) : 62.75  Vital Signs: Temp: 98.3 F (36.8 C) (03/27 2300) Temp Source: Oral (03/27 2300) BP: 82/48 (03/28 0400) Pulse Rate: 127 (03/28 0400)  Labs:  Recent Labs  01/03/17 0353 01/03/17 1644 01/04/17 0510  01/04/17 0855 01/04/17 1621 01/05/17 0356  HGB 8.1*  --  7.9*  --   --   --   --   HCT 27.8*  --  26.4*  --   --   --   --   PLT 118*  --  110*  --   --   --   --   HEPARINUNFRC 0.39  --   --   < > 0.14* 0.30 0.23*  CREATININE 1.70* 1.72* 1.77*  --   --  1.86*  --   < > = values in this interval not displayed.  Estimated Creatinine Clearance: 51.3 mL/min (A) (by C-G formula based on SCr of 1.86 mg/dL (H)).  Assessment: Heparin for afib while warfarin on hold, heparin level sub-therapeutic this AM, no issues per RN .  Goal of Therapy:  Heparin level 0.3-0.7 units/ml Monitor platelets by anticoagulation protocol: Yes   Plan:  -Inc heparin to 2650 units/hr -1300 HL  Narda Bonds 01/05/2017,4:25 AM

## 2017-01-05 NOTE — Progress Notes (Addendum)
ANTICOAGULATION CONSULT NOTE - Follow Up Consult  Pharmacy Consult for Heparin Indication: atrial fibrillation  Allergies  Allergen Reactions  . Penicillins Hives, Itching, Swelling and Other (See Comments)    Tongue swelling Has patient had a PCN reaction causing immediate rash, facial/tongue/throat swelling, SOB or lightheadedness with hypotension: Yes  Clarified with the patient her penicillin allergy. Pt has tolerated cefepime in the past. Pt also states that she can take amoxicillin.    . Citrus Hives    Patient Measurements: Height: 5' 7.5" (171.5 cm) Weight: (!) 310 lb (140.6 kg) IBW/kg (Calculated) : 62.75 Heparin Dosing Weight: 111kg  Vital Signs: Temp: 97.8 F (36.6 C) (03/28 1118) Temp Source: Oral (03/28 1118) BP: 86/45 (03/28 1353) Pulse Rate: 131 (03/28 1353)  Labs:  Recent Labs  01/03/17 0353  01/04/17 0510  01/04/17 0855 01/04/17 1621 01/05/17 0355 01/05/17 0356 01/05/17 0800  HGB 8.1*  --  7.9*  --   --   --   --   --  8.0*  HCT 27.8*  --  26.4*  --   --   --   --   --  27.9*  PLT 118*  --  110*  --   --   --   --   --  127*  HEPARINUNFRC 0.39  --   --   < > 0.14* 0.30  --  0.23*  --   CREATININE 1.70*  < > 1.77*  --   --  1.86* 1.75*  --   --   < > = values in this interval not displayed.  Estimated Creatinine Clearance: 51.9 mL/min (A) (by C-G formula based on SCr of 1.75 mg/dL (H)).   Medications:  Heparin @ 2650 units/hr  Assessment: 60yof on coumadin pta for afib, admitted with volume overload and renal failure. INR 2.84 on admit and coumadin initially resumed (received one dose of 5mg ). On 3/20, coumadin held, and she received 5mg  vitamin k to reverse INR anticipating need for CRRT. Heparin bridge started. Heparin level is below goal at 0.17 despite rate increase earlier today. No issues with infusion. Hgb stable. No bleeding. To IR tomorrow for tunneled HD catheter placement.  Goal of Therapy:  Heparin level 0.3-0.7 units/ml Monitor  platelets by anticoagulation protocol: Yes   Plan:  1) Re-bolus heparin 2000 units x 1 2) Increase rate to 2850 units/hr 3) Check 8 hour heparin level  Deboraha Sprang 01/05/2017,2:32 PM

## 2017-01-05 NOTE — Consult Note (Signed)
Chief Complaint: Patient was seen in consultation today for tunneled dialysis catheter placement Chief Complaint  Patient presents with  . Respiratory Distress   at the request of Dr Sharyne Richters  Referring Physician(s): Dr Sharyne Richters  Supervising Physician: Sandi Mariscal  Patient Status: [facility] - In-pt  History of Present Illness: Paige Marshall is a 61 y.o. female   Morbid obesity CHF and acute on chronic renal failure- cardiorenal syndrome HTN; Anemia; Hyperkalemia L IJ Vas cath placed 3/21 CCM Continues on CRRT- volume overload Minimal urine Pulm edema on CXR  Worsening renal function Will need ongoing dialysis Need temp cath changed to tunneled catheter Plan for 3/29 in IR  Past Medical History:  Diagnosis Date  . Arthritis    "both knees" (01/14/2015)  . Asthma   . Chronic combined systolic and diastolic CHF (congestive heart failure) ([place])   . CKD (chronic kidney disease), stage III   . COPD (chronic obstructive pulmonary disease) ([place])   . Degenerative joint disease of both lower legs   . Dyspnea   . Edema    Lower extremity  . Former smoker   . Gout    hx in "both knees" (01/14/2015)  . H. pylori infection 01/03/2014   +breath test.    . High cholesterol   . Hypertension   . Morbid obesity ([place])   . NICM (nonischemic cardiomyopathy) ([place])    a. LHC 01/2015: normal cors, EF 45-50%. b. EF 50-55% in 08/2016.  [name] Nonischemic cardiomyopathy ([place])   . Normal coronary arteries   . OSA (obstructive sleep apnea)    "never sent CPAP out" (01/14/2015)  . Paroxysmal atrial fibrillation ([facility])   . Pneumonia X 2  . Pulmonary hypertension   . Respiratory failure ([place])   . Rheumatoid arthritis ([place])   . Stroke [facility])     Past Surgical History:  Procedure Laterality Date  . ANGIOGRAM/LV (CONGENITAL)  2007  . BREATH TEK H PYLORI N/A 12/31/2013   Procedure: [place];  Surgeon: Gayland Curry, MD;  Location: [facility];  Service: General;  Laterality: N/A;  .  CORNEAL TRANSPLANT Right ~ 2010  . CORONARY ANGIOPLASTY WITH STENT PLACEMENT  11/04/2005   "1"  . DOPPLER ECHOCARDIOGRAPHY     2 D   EF of 45%  . EYE SURGERY    . LEFT HEART CATHETERIZATION WITH CORONARY ANGIOGRAM N/A 01/20/2015   Procedure: LEFT HEART CATHETERIZATION WITH CORONARY ANGIOGRAM;  Surgeon: Lorretta Harp, MD;  Location: [facility] CATH LAB;  Service: Cardiovascular;  Laterality: N/A;  . sleep study  2011  . stress myocardial dipyridamole perfusion    . TUBAL LIGATION  1982  . venous duplex ultrasound  2013    Allergies: Penicillins and Citrus  Medications: Prior to Admission medications   Medication Sig Start Date End Date Taking? Authorizing Provider  ARTIFICIAL TEAR OP Apply 1 drop to eye every 6 (six) hours as needed (dry eyes).   Yes Historical Provider, MD  carvedilol (COREG) 6.25 MG tablet Take 1 tablet (6.25 mg total) by mouth 2 (two) times daily with a meal. 09/23/16  Yes Shanker Kristeen Mans, MD  diclofenac sodium (VOLTAREN) 1 % GEL Apply 4 g topically every 6 (six) hours as needed (pain).    Yes Historical Provider, MD  diltiazem (DILACOR XR) 180 MG 24 hr capsule Take 180 mg by mouth daily.   Yes Historical Provider, MD  docusate sodium (COLACE) 100 MG capsule Take 1 capsule (100 mg total)  by mouth 2 (two) times daily. 09/14/16  Yes Eugenie Filler, MD  ferrous sulfate 325 (65 FE) MG tablet Take 1 tablet (325 mg total) by mouth 3 (three) times daily with meals. 09/23/16  Yes Shanker Kristeen Mans, MD  furosemide (LASIX) 40 MG tablet Take 1 tablet (40 mg total) by mouth 2 (two) times daily. Patient taking differently: Take 80 mg by mouth 2 (two) times daily.  11/13/16  Yes Theodis Blaze, MD  metolazone (ZAROXOLYN) 2.5 MG tablet Take 1 tablet (2.5 mg total) by mouth daily. 12/15/16 03/15/17 Yes Lorretta Harp, MD  Multiple Vitamin (MULTIVITAMIN WITH MINERALS) TABS tablet Take 1 tablet by mouth daily.   Yes Historical Provider, MD  omega-3 acid ethyl esters (LOVAZA) 1 g capsule Take 1  g by mouth 2 (two) times daily.   Yes Historical Provider, MD  oxyCODONE (OXY IR/ROXICODONE) 5 MG immediate release tablet Take 1 tablet (5 mg total) by mouth every 6 (six) hours as needed for breakthrough pain. 11/13/16  Yes Theodis Blaze, MD  polyethylene glycol [facility] / Floria Raveling) packet Take 17 g by mouth daily as needed. Patient taking differently: Take 17 g by mouth daily as needed (constipation). Mix in 8 oz liquid and drink 09/23/16  Yes Shanker Kristeen Mans, MD  potassium chloride SA (K-DUR,KLOR-CON) 20 MEQ tablet Take 40 mEq by mouth 3 (three) times daily.    Yes Historical Provider, MD  predniSONE (DELTASONE) 10 MG tablet Take 40 mg tablet 11/14/2016 and taper down by 10 mg daily until completed 11/13/16  Yes Theodis Blaze, MD  PROAIR HFA 108 [phone] Base) MCG/ACT inhaler INHALE 2 PUFFS EVERY 6 HOURS AS NEEDED FOR WHEEZING OR SHORTNESS OF BREATH. 11/12/16  Yes Deneise Lever, MD  promethazine (PHENERGAN) 25 MG tablet Take 25 mg by mouth every 6 (six) hours as needed for nausea or vomiting.   Yes Historical Provider, MD  propafenone (RYTHMOL) 150 MG tablet Take 1 tablet (150 mg total) by mouth every 8 (eight) hours. 11/13/16  Yes Theodis Blaze, MD  senna-docusate (SENNA S) 8.6-50 MG tablet Take 2 tablets by mouth See admin instructions. Take 2 tablet by mouth daily at bedtime, may also take 2 tablets as needed for constipation   Yes Historical Provider, MD  warfarin (COUMADIN) 5 MG tablet Take 5 mg by mouth daily.   Yes Historical Provider, MD  zolpidem (AMBIEN) 5 MG tablet Take 5 mg by mouth at bedtime as needed for sleep.   Yes Historical Provider, MD  bisacodyl (DULCOLAX) 5 MG EC tablet Take 1 tablet (5 mg total) by mouth daily as needed for moderate constipation. Patient not taking: Reported on 12/26/2016 11/13/16   Theodis Blaze, MD  PRESCRIPTION MEDICATION Inhale into the lungs at bedtime. BIPAP    Historical Provider, MD     Family History  Problem Relation Age of Onset  . Heart disease Mother   .  Cancer Father     colon  . Hypertension Other     Social History   Social History  . Marital status: Widowed    Spouse name: N/A  . Number of children: N/A  . Years of education: N/A   Social History Main Topics  . Smoking status: Former Smoker    Packs/day: 0.33    Years: 35.00    Types: Cigarettes    Quit date: 10/11/2008  . Smokeless tobacco: Never Used  . Alcohol use No     Comment: 01/14/2015 "~ 4, 24oz  cans beer/wk"  . Drug use: No  . Sexual activity: No   Other Topics Concern  . None   Social History Narrative  . None    Review of Systems: A 12 point ROS discussed and pertinent positives are indicated in the HPI above.  All other systems are negative.  Review of Systems  Constitutional: Positive for activity change and fatigue. Negative for appetite change and fever.  Cardiovascular: Negative for chest pain.  Gastrointestinal: Negative for abdominal pain.  Neurological: Positive for weakness.  Psychiatric/Behavioral: Negative for behavioral problems and confusion.    Vital Signs: BP 95/70   Pulse (!) 130   Temp 97.8 F (36.6 C) (Oral)   Resp (!) 22   Ht 5' 7.5" (1.715 m)   Wt (!) 310 lb (140.6 kg)   LMP 09/07/2011   SpO2 95%   BMI 47.84 kg/m   Physical Exam  Constitutional: She is oriented to person, place, and time.  Cardiovascular:  No murmur heard. Irreg   Pulmonary/Chest: Effort normal. She has wheezes.  Abdominal: Soft. Bowel sounds are normal.  Musculoskeletal: Normal range of motion.  Minimal use legs  Neurological: She is alert and oriented to person, place, and time.  Skin: Skin is warm and dry.  Psychiatric: She has a normal mood and affect. Her behavior is normal. Judgment and thought content normal.  Nursing note and vitals reviewed.   Mallampati Score:  MD Evaluation Airway: WNL Heart: WNL Abdomen: WNL Chest/ Lungs: WNL ASA  Classification: 3 Mallampati/Airway Score: Two  Imaging: US Renal  Result Date:  12/26/2016 CLINICAL DATA:  Acute on chronic renal failure EXAM: RENAL / URINARY TRACT ULTRASOUND COMPLETE COMPARISON:  None. FINDINGS: Right Kidney: Length: 12.1 cm. Echogenicity within normal limits. No mass or hydronephrosis visualized. Left Kidney: Length: 11.1 cm. Echogenicity within normal limits. No mass or hydronephrosis visualized. Bladder: The bladder is nondistended. The patient voided immediately before the examination. Other: Moderate volume of lower abdominal ascites. IMPRESSION: 1. Normal appearance of the kidneys. 2. Moderate lower abdominal ascites. Electronically Signed   By: Ulyses Jarred M.D.   On: 12/26/2016 21:53   Dg Chest Port 1 View  Result Date: 01/04/2017 CLINICAL DATA:  Pulmonary edema. EXAM: PORTABLE CHEST 1 VIEW COMPARISON:  01/02/2017. FINDINGS: Left IJ line, right subclavian line in stable position. Stable cardiomegaly. Diffuse bilateral pulmonary infiltrates consistent with pulmonary edema again noted. Bilateral pneumonia cannot be excluded. Small left pleural effusion again noted. IMPRESSION: 1.  Central lines in stable position. 2. Persistent cardiomegaly with diffuse bilateral pulmonary infiltrates consistent pulmonary edema. Small left pleural effusion. These findings consistent CHF. Bilateral pneumonia cannot be excluded. No change from prior exam. Electronically Signed   By: Marcello Moores  Register   On: 01/04/2017 07:17   Dg Chest Port 1 View  Result Date: 01/02/2017 CLINICAL DATA:  Patient with acute respiratory failure. EXAM: PORTABLE CHEST 1 VIEW COMPARISON:  Chest radiograph 12/30/2016 FINDINGS: Right subclavian central venous catheter tip projects over the superior vena cava. Left-sided approach dual-lumen central venous catheter tip projects against the suspected wall of the superior vena cava/ azygos vein. Monitoring leads overlie the patient. Stable cardiomegaly. Re- demonstrated diffuse bilateral interstitial pulmonary opacities. Retrocardiac consolidation. Probable  small left pleural effusion. No pneumothorax. IMPRESSION: Cardiomegaly and associated pulmonary edema. Stable support apparatus. Electronically Signed   By: Lovey Newcomer M.D.   On: 01/02/2017 07:19   Dg Chest Port 1 View  Result Date: 12/30/2016 CLINICAL DATA:  Respiratory failure.  Hypoxia.  Tachycardia. EXAM: PORTABLE  CHEST 1 VIEW COMPARISON:  One-view chest x-ray 12/29/2016 FINDINGS: Heart is enlarged. A right subclavian and left IJ catheter are stable. Edema and bilateral pleural effusions are not significantly changed. Bibasilar airspace disease is noted. IMPRESSION: 1. Stable findings of congestive heart failure. 2. Support apparatus is stable. Electronically Signed   By: San Morelle M.D.   On: 12/30/2016 08:50   Dg Chest Port 1 View  Result Date: 12/29/2016 CLINICAL DATA:  Encounter for central line placement. EXAM: PORTABLE CHEST 1 VIEW COMPARISON:  Radiograph of December 27, 2016. FINDINGS: Stable cardiomegaly. Bilateral perihilar interstitial densities are noted concerning for edema. Interval placement of left internal jugular dialysis catheter with distal tips in expected position of cavoatrial junction. Stable position of right subclavian catheter with distal tip in expected position of the SVC. No pneumothorax is noted. Possible mild bilateral pleural effusions are noted. Bony thorax is unremarkable. IMPRESSION: Stable cardiomegaly is noted with probable bilateral perihilar edema and mild pleural effusions. Aortic atherosclerosis. Stable position of right subclavian catheter. Interval placement of left internal jugular dialysis catheter with distal tip in expected position of cavoatrial junction. Electronically Signed   By: Marijo Conception, M.D.   On: 12/29/2016 14:23   Dg Chest Port 1 View  Result Date: 12/27/2016 CLINICAL DATA:  Post central line placement. EXAM: PORTABLE CHEST 1 VIEW COMPARISON:  12/26/2016 FINDINGS: Right-sided central line terminates at the low SVC. Midline  trachea. Cardiomegaly accentuated by AP portable technique. Atherosclerosis in the transverse aorta. Mildly degraded exam due to AP portable technique and patient body habitus. No pneumothorax. Suspect mild pulmonary venous congestion. Left lung base and pleural space not well evaluated. No right-sided pleural fluid or consolidation. IMPRESSION: Right-sided central line terminating at the low SVC ; no pneumothorax. Cardiomegaly with suggestion of mild pulmonary venous congestion. Aortic atherosclerosis. Electronically Signed   By: Abigail Miyamoto M.D.   On: 12/27/2016 16:39   Dg Chest Port 1 View  Result Date: 12/26/2016 CLINICAL DATA:  Dyspnea, respiratory distress. EXAM: PORTABLE CHEST 1 VIEW COMPARISON:  11/07/2016 FINDINGS: Patient rotated to the right. Lungs are adequately inflated demonstrate prominent perihilar markings suggesting mild vascular congestion. Stable hazy left base/ retrocardiac opacification. Moderate stable cardiomegaly. Calcified plaque over the aortic arch. Remainder of the exam is unchanged. IMPRESSION: Moderate stable cardiomegaly with suggestion of vascular congestion. Could not exclude effusions/atelectasis versus infection in the left base. Electronically Signed   By: Marin Olp M.D.   On: 12/26/2016 15:30    Labs:  CBC:  Recent Labs  01/02/17 0326 01/03/17 0353 01/04/17 0510 01/05/17 0800  WBC 8.5 8.3 8.5 7.9  HGB 8.3* 8.1* 7.9* 8.0*  HCT 27.6* 27.8* 26.4* 27.9*  PLT 113* 118* 110* 127*    COAGS:  Recent Labs  08/30/16 0507  12/28/16 1200 12/29/16 0444 12/30/16 0500 12/31/16 0332  INR 1.08  < > 2.99 1.92 1.43 1.32  APTT 42*  --   --   --   --   --   < > = values in this interval not displayed.  BMP:  Recent Labs  01/03/17 1644 01/04/17 0510 01/04/17 1621 01/05/17 0355  NA 135 136 135 137  K 4.3 3.9 4.2 4.0  CL 98* 99* 98* 100*  CO2 25 26 26 25   GLUCOSE 92 94 96 77  BUN 9 9 10 9   CALCIUM 8.4* 8.5* 8.6* 8.2*  CREATININE 1.72* 1.77* 1.86*  1.75*  GFRNONAA 32* 31* 29* 31*  GFRAA 37* 35* 33* 36*  LIVER FUNCTION TESTS:  Recent Labs  08/28/16 2132  09/18/16 1126 09/19/16 0634  09/22/16 0331  01/03/17 1644 01/04/17 0510 01/04/17 1621 01/05/17 0355  BILITOT 1.2  --  1.3* 1.3*  --  1.3*  --   --   --   --   --   AST 21  --  278* 130*  --  30  --   --   --   --   --   ALT 12*  --  243* 212*  --  95*  --   --   --   --   --   ALKPHOS 61  --  78 85  --  62  --   --   --   --   --   PROT 7.6  --  6.2* 6.1*  --  6.0*  --   --   --   --   --   ALBUMIN 4.3  < > 2.8* 2.8*  < > 2.8*  2.8*  < > 2.9* 2.8* 2.8* 2.6*  < > = values in this interval not displayed.  TUMOR MARKERS: No results for input(s): AFPTM, CEA, CA199, CHROMGRNA in the last 8760 hours.  Assessment and Plan:  CHF; A/CRF Volume overload Temp dialysis cath placed 3/21- CCM Now will need continued dialysis; worsening Cr Scheduled for tunneled catheter placement in IR 3/29 Risks and Benefits discussed with the patient including, but not limited to bleeding, infection, vascular injury, pneumothorax which may require chest tube placement, air embolism or even death All of the patient's questions were answered, patient is agreeable to proceed. Consent signed and in chart.   Thank you for this interesting consult.  I greatly enjoyed meeting SUMIYA MAMARIL and look forward to participating in their care.  A copy of this report was sent to the requesting provider on this date.  Electronically Signed: Monia Sabal A 01/05/2017, 1:45 PM   I spent a total of 20 Minutes    in face to face in clinical consultation, greater than 50% of which was counseling/coordinating care for tunneled dialysis catheter placement

## 2017-01-06 ENCOUNTER — Encounter (HOSPITAL_COMMUNITY): Payer: Self-pay | Admitting: Nurse Practitioner

## 2017-01-06 ENCOUNTER — Inpatient Hospital Stay (HOSPITAL_COMMUNITY): Payer: Medicare Other

## 2017-01-06 DIAGNOSIS — E662 Morbid (severe) obesity with alveolar hypoventilation: Secondary | ICD-10-CM

## 2017-01-06 DIAGNOSIS — J810 Acute pulmonary edema: Secondary | ICD-10-CM

## 2017-01-06 DIAGNOSIS — I481 Persistent atrial fibrillation: Secondary | ICD-10-CM

## 2017-01-06 DIAGNOSIS — I484 Atypical atrial flutter: Secondary | ICD-10-CM

## 2017-01-06 HISTORY — PX: IR GENERIC HISTORICAL: IMG1180011

## 2017-01-06 LAB — CBC
HCT: 27.9 % — ABNORMAL LOW (ref 36.0–46.0)
Hemoglobin: 8.4 g/dL — ABNORMAL LOW (ref 12.0–15.0)
MCH: 26.9 pg (ref 26.0–34.0)
MCHC: 30.1 g/dL (ref 30.0–36.0)
MCV: 89.4 fL (ref 78.0–100.0)
PLATELETS: 141 10*3/uL — AB (ref 150–400)
RBC: 3.12 MIL/uL — AB (ref 3.87–5.11)
RDW: 27.5 % — AB (ref 11.5–15.5)
WBC: 8.7 10*3/uL (ref 4.0–10.5)

## 2017-01-06 LAB — RENAL FUNCTION PANEL
ANION GAP: 9 (ref 5–15)
Albumin: 2.8 g/dL — ABNORMAL LOW (ref 3.5–5.0)
Albumin: 2.7 g/dL — ABNORMAL LOW (ref 3.5–5.0)
Anion gap: 9 (ref 5–15)
BUN: 10 mg/dL (ref 6–20)
BUN: 12 mg/dL (ref 6–20)
CALCIUM: 8.6 mg/dL — AB (ref 8.9–10.3)
CALCIUM: 8.4 mg/dL — AB (ref 8.9–10.3)
CHLORIDE: 99 mmol/L — AB (ref 101–111)
CO2: 26 mmol/L (ref 22–32)
CO2: 25 mmol/L (ref 22–32)
CREATININE: 1.76 mg/dL — AB (ref 0.44–1.00)
CREATININE: 2.31 mg/dL — AB (ref 0.44–1.00)
Chloride: 102 mmol/L (ref 101–111)
GFR calc Af Amer: 26 mL/min — ABNORMAL LOW (ref >60)
GFR calc non Af Amer: 31 mL/min — ABNORMAL LOW (ref >60)
GFR calc non Af Amer: 22 mL/min — ABNORMAL LOW (ref >60)
GFR, EST AFRICAN AMERICAN: 36 mL/min — AB (ref >60)
Glucose, Bld: 101 mg/dL — ABNORMAL HIGH (ref 65–99)
Glucose, Bld: 92 mg/dL (ref 65–99)
POTASSIUM: 4.8 mmol/L (ref 3.5–5.1)
Phosphorus: 2.7 mg/dL (ref 2.5–4.6)
Phosphorus: 2.4 mg/dL — ABNORMAL LOW (ref 2.5–4.6)
Potassium: 5.3 mmol/L — ABNORMAL HIGH (ref 3.5–5.1)
SODIUM: 134 mmol/L — AB (ref 135–145)
Sodium: 136 mmol/L (ref 135–145)

## 2017-01-06 LAB — COOXEMETRY PANEL
CARBOXYHEMOGLOBIN: 2.3 % — AB (ref 0.5–1.5)
Carboxyhemoglobin: 2.5 % — ABNORMAL HIGH (ref 0.5–1.5)
METHEMOGLOBIN: 1.1 % (ref 0.0–1.5)
Methemoglobin: 1.1 % (ref 0.0–1.5)
O2 SAT: 39.9 %
O2 Saturation: 51.1 %
TOTAL HEMOGLOBIN: 8.4 g/dL — AB (ref 12.0–16.0)
Total hemoglobin: 8.6 g/dL — ABNORMAL LOW (ref 12.0–16.0)

## 2017-01-06 LAB — MAGNESIUM: Magnesium: 2.5 mg/dL — ABNORMAL HIGH (ref 1.7–2.4)

## 2017-01-06 LAB — HEPARIN LEVEL (UNFRACTIONATED)
HEPARIN UNFRACTIONATED: 0.49 [IU]/mL (ref 0.30–0.70)
HEPARIN UNFRACTIONATED: 0.17 [IU]/mL — AB (ref 0.30–0.70)

## 2017-01-06 MED ORDER — MIDAZOLAM HCL 2 MG/2ML IJ SOLN
INTRAMUSCULAR | Status: AC | PRN
  Administered 2017-01-06 (×2): 1 mg via INTRAVENOUS

## 2017-01-06 MED ORDER — FENTANYL CITRATE (PF) 100 MCG/2ML IJ SOLN
INTRAMUSCULAR | Status: AC
  Filled 2017-01-06: qty 4

## 2017-01-06 MED ORDER — MIDAZOLAM HCL 2 MG/2ML IJ SOLN
INTRAMUSCULAR | Status: AC
  Filled 2017-01-06: qty 4

## 2017-01-06 MED ORDER — SODIUM CHLORIDE 0.9 % IV SOLN
1500.0000 mg | INTRAVENOUS | Status: AC
  Administered 2017-01-06: 1500 mg via INTRAVENOUS
  Filled 2017-01-06: qty 1500

## 2017-01-06 MED ORDER — HEPARIN (PORCINE) IN NACL 100-0.45 UNIT/ML-% IJ SOLN
3400.0000 [IU]/h | INTRAMUSCULAR | Status: DC
  Administered 2017-01-06: 3200 [IU]/h via INTRAVENOUS
  Administered 2017-01-07: 3400 [IU]/h via INTRAVENOUS
  Filled 2017-01-06 (×2): qty 250

## 2017-01-06 MED ORDER — LIDOCAINE HCL (PF) 1 % IJ SOLN
INTRAMUSCULAR | Status: AC
  Filled 2017-01-06: qty 30

## 2017-01-06 MED ORDER — LIDOCAINE HCL (PF) 1 % IJ SOLN
INTRAMUSCULAR | Status: AC | PRN
  Administered 2017-01-06: 15 mL

## 2017-01-06 MED ORDER — HEPARIN SODIUM (PORCINE) 1000 UNIT/ML IJ SOLN
INTRAMUSCULAR | Status: AC
  Filled 2017-01-06: qty 1

## 2017-01-06 MED ORDER — FENTANYL CITRATE (PF) 100 MCG/2ML IJ SOLN
INTRAMUSCULAR | Status: AC | PRN
  Administered 2017-01-06 (×2): 50 ug via INTRAVENOUS

## 2017-01-06 MED ORDER — HEPARIN BOLUS VIA INFUSION
3000.0000 [IU] | Freq: Once | INTRAVENOUS | Status: AC
  Administered 2017-01-06: 3000 [IU] via INTRAVENOUS
  Filled 2017-01-06: qty 3000

## 2017-01-06 NOTE — Progress Notes (Addendum)
PULMONARY / CRITICAL CARE MEDICINE   Name: Paige Marshall MRN: [id number] DOB: 05-20-1956    ADMISSION DATE:  12/26/2016 CONSULTATION DATE:  12/30/16  REFERRING MD:  Dr. Tommy Medal   CHIEF COMPLAINT:  Shortness of breath   HISTORY OF PRESENT ILLNESS:  61 y/o F admitted from rehab facility (resident since November 2017) on 3/18 with complaints of shortness of breath & lower extremity swelling.    The patient has a medical history of morbid obesity / wheelchair bound, untreated OHS, chronic combined systolic / diastolic CHF, NICM, AF on coumadin, HTN, 3L O2 dependent, pulmonary hypertension, former smoker (11 pk years) with COPD & CKD III.  The patient suffered a left fibular fracture in late 2017.  She was recently admitted from 1/28-11/13/16 for acute CHF exacerbation.  At that time, her discharge weight was 370lbs.    She returned from the rehab facility on 3/18 with increased SOB and lower extremity swelling.  She if followed by Dr. Gwenlyn Found for cardiology and was recently switched to lasix and metolazone.  Lasix was discontinued at the SNF due to rising sr cr.  Her admit weight was 411lbs.  She was admitted by IMTS for decompensated CHF.  She developed worsening AKI with concern for cardio-renal syndrome.  An HD catheter was inserted 3/21 and CVVHD was initiated.   She remains on milrinone, amiodarone and heparin gtt's.  Rate control has been difficult to achieve.  Over the past 24 hours, she is net negative 3.5L.  She developed respiratory distress am of 3/22 and BiPAP was initiated.    PCCM consulted for evaluation.   SUBJECTIVE:  No events overnight, tolerating 5L with negative 115 lb down with CRRT  VITAL SIGNS: BP (!) 91/53   Pulse (!) 132   Temp 98.5 F (36.9 C) (Oral)   Resp 17   Ht 5' 7.5" (1.715 m)   Wt 135.6 kg (299 lb)   LMP 09/07/2011   SpO2 94%   BMI 46.14 kg/m   HEMODYNAMICS: CVP:  [7 mmHg-12 mmHg] 7 mmHg  VENTILATOR SETTINGS: FiO2 (%):  [40 %] 40 %  INTAKE /  OUTPUT: I/O last 3 completed shifts: In: 2667.9 [P.O.:140; I.V.:2269.9; IV Piggyback:258] Out: 37628 [Urine:40; Other:12146]  PHYSICAL EXAMINATION: Gen:      No acute distress, morbid obesity, off BiPAP but on 5L [place] (home flow is 3L) HEENT:  [place]/AT, PERRL, EOM-I and MMM Neck:      Supple, no masses or JVD Lungs:    Distant and decreased but clear CV:         Tachy, Nl S1/S2, -M/R/G. Abd:      Soft, NT, ND and +BS Ext:    Edema improving; adequate peripheral perfusion Skin:       Warm and dry; no rash Neuro:     Alert and oriented x 3  LABS:  BMET  Recent Labs Lab 01/05/17 0355 01/05/17 1604 01/06/17 0529  NA 137 137 134*  K 4.0 5.1 5.3*  CL 100* 100* 99*  CO2 25 27 26   BUN 9 10 10   CREATININE 1.75* 1.78* 1.76*  GLUCOSE 77 104* 101*   Electrolytes  Recent Labs Lab 01/04/17 0510  01/05/17 0355 01/05/17 1604 01/06/17 0529  CALCIUM 8.5*  < > 8.2* 8.7* 8.6*  MG 2.3  --  2.2  --  2.5*  PHOS 3.6  < > 1.9*  1.8* 1.7* 2.7  < > = values in this interval not displayed.  CBC  Recent Labs Lab  01/04/17 0510 01/05/17 0800 01/06/17 0529  WBC 8.5 7.9 8.7  HGB 7.9* 8.0* 8.4*  HCT 26.4* 27.9* 27.9*  PLT 110* 127* 141*   Coag's  Recent Labs Lab 12/31/16 0332  INR 1.32   Sepsis Markers No results for input(s): LATICACIDVEN, PROCALCITON, O2SATVEN in the last 168 hours.  ABG  Recent Labs Lab 12/30/16 0938  PHART 7.414  PCO2ART 47.8  PO2ART 48.0*   Liver Enzymes  Recent Labs Lab 01/05/17 0355 01/05/17 1604 01/06/17 0529  ALBUMIN 2.6* 2.8* 2.8*   Cardiac Enzymes No results for input(s): TROPONINI, PROBNP in the last 168 hours.  Glucose  Recent Labs Lab 01/04/17 1538  GLUCAP 111*   Imaging No results found. STUDIES:  US Renal 3/18 >> normal appearance of kidneys, moderate lower abdominal ascites CXR 3/22>> unchanged edema, bilateral effusions. I have reviewed the images personally.  CULTURES: None  ANTIBIOTICS: None  SIGNIFICANT  EVENTS: 3/18  Admit with SOB, increased LE swelling > decompensated CHF  3/21  CVVHD initiated  3/22  PCCM consulted for respiratory distress   LINES/TUBES: R [place] TLC 3/19 >>  L IJ HD Cath 3/21 >>   I reviewed CXR mylself, cardiomegally and pulmonary edema noted.  DISCUSSION: 61 y/o F admitted with decompensated CHF, volume overload, AKI with concern for cardiorenal syndrome.  CVVHD in progress.  Tolerating NIV/Bipap.   ASSESSMENT / PLAN:  PULMONARY A: Acute on Chronic Hypoxemic Hypercapneic Respiratory Failure 2/2 Volume overload/Pulmonary edema + CHF exacerbation pEF + OSA + OHS + Pulm HTN Pt with h/o asthma/copd but it is not in exacerbation P:   - D/C BiPAP - Switch to CPAP - Down to 5L [employer], continue to titrates - Hopefully no intubation during catheter placement as patient has to lay flat and she is asking for sedation.  CARDIOVASCULAR A:  AF with RVR - on coumadin  Acute on Chronic Diastolic CHF with Volume Overload Acute RV Failure / Cor Pulmonale  HTN  P:  - On Cardizem and heparin gtt - Milrinone off - [place] per cards - EP to evaluate today - Likely will need a right heart cath in AM per Dr. Missy Sabins.  RENAL A:   Acute on Chronic Renal Failure - suspect cardiorenal syndrome, renal US normal Hyperkalemia P:   - CVVH off today, will need a perm cath for standard HD. - BMET in AM - Replace electrolytes as indicated  GASTROINTESTINAL A:   Morbid Obesity  P:   - PO diet - Heart healthy, carb modified diet - Fluid restriction  HEMATOLOGIC A:   Anemia - suspect in setting of CKD + frequent phlebotomy  Got 2 units PRBC per cardiology 3/24. No evidence of bleed. Chronic Anticoagulation P:  - Follow CBC - Continue heparin anticoagulation  INFECTIOUS A:   No acute infectious process  P:   - Observe off antibiotics  ENDOCRINE A:   No acute issues  P:   - Follow glucose on BMP  NEUROLOGIC A:   Anxiety  P:   - PRN Xanax for  anxiety.  FAMILY  - Updates: Patient updated bedside, will continue milrinone for BP support, cardizem and amio for HR control.  Spoke with EP, will likely continue amio through the weekend then may need to discuss ablation.  Will keep in the ICU given drips and going for tunnled catheter today.  Concerned that with sedation for catheter high likelihood of getting intubated.  - Inter-disciplinary family meet or Palliative Care meeting due by:  3/29  The patient is critically ill with multiple organ systems failure and requires high complexity decision making for assessment and support, frequent evaluation and titration of therapies, application of advanced monitoring technologies and extensive interpretation of multiple databases.   Critical Care Time devoted to patient care services described in this note is  35  Minutes. This time reflects time of care of this signee Dr Jennet Maduro. This critical care time does not reflect procedure time, or teaching time or supervisory time of PA/NP/Med student/Med Resident etc but could involve care discussion time.  Rush Farmer, M.D. [facility] Pulmonary/Critical Care Medicine. Pager: [phone]. After hours pager: [phone].  01/06/2017, 9:37 AM

## 2017-01-06 NOTE — Progress Notes (Signed)
[facility] Rounding Note Subjective:    Continues on CRRT (initiated 3/21) Weight is down from 187.8 kg (414 lb) on 3/21 to today's weight of 135.6 kg (299 lb) - 115 lb!!!!! Making minimal if any urine CVP 7 Thinks could lie flat for [facility] "if you knock me out"  Objective Vital signs in last 24 hours: Vitals:   01/06/17 0500 01/06/17 0600 01/06/17 0700 01/06/17 0800  BP: (!) 80/46 96/83 (!) 89/77 (!) 79/63  Pulse: (!) 127 (!) 130 (!) 121 (!) 131  Resp: (!) 25 (!) 24 15 (!) 25  Temp:    98.5 F (36.9 C)  TempSrc:    Oral  SpO2: 97% 97% 98% 90%  Weight: 135.6 kg (299 lb)     Height:       Weight change: -4.99 kg (-11 lb)  Intake/Output Summary (Last 24 hours) at 01/06/17 0853 Last data filed at 01/06/17 0800  Gross per 24 hour  Intake          1864.91 ml  Output             8154 ml  Net         -6289.09 ml    Physical Exam: MO AAF VS as noted Left IJ vascath placed 3/21 in use for CRRT Pleasant, wearing nasal cannula Still w/mild tachypnea Tachy 120's looks like ST on tele Anteriorly clear Pitting edema of abd wall, post thighs. LE's below the knees all now just trace-1+ and MARKEDLY better  Labs:  Recent Labs Lab 01/05/17 0355 01/05/17 1604 01/06/17 0529  NA 137 137 134*  K 4.0 5.1 5.3*  CL 100* 100* 99*  CO2 25 27 26   GLUCOSE 77 104* 101*  BUN 9 10 10   CREATININE 1.75* 1.78* 1.76*  CALCIUM 8.2* 8.7* 8.6*  PHOS 1.9*  1.8* 1.7* 2.7     Recent Labs Lab 01/05/17 0355 01/05/17 1604 01/06/17 0529  ALBUMIN 2.6* 2.8* 2.8*   Iron/TIBC/Ferritin/ %Sat    Component Value Date/Time   IRON 64 12/29/2016 0444   TIBC 274 12/29/2016 0444   FERRITIN 115 12/29/2016 0444   IRONPCTSAT 23 12/29/2016 0444     Recent Labs Lab 01/02/17 0326 01/03/17 0353 01/04/17 0510 01/05/17 0800 01/06/17 0529  WBC 8.5 8.3 8.5 7.9 8.7  HGB 8.3* 8.1* 7.9* 8.0* 8.4*  HCT 27.6* 27.8* 26.4* 27.9* 27.9*  MCV 88.7 90.0 89.8 89.1 89.4  PLT 113* 118* 110* 127* 141*     Medications: Infusions: . amiodarone 30 mg/hr (01/06/17 0319)  . diltiazem (CARDIZEM) infusion 10 mg/hr (01/06/17 0605)  . heparin 3,200 Units/hr (01/06/17 8341)  . milrinone 0.25 mcg/kg/min (01/06/17 9622)  . dialysis replacement fluid (prismasate) 300 mL/hr at 01/05/17 1804  . dialysis replacement fluid (prismasate) 300 mL/hr at 01/05/17 1804  . dialysate (PRISMASATE) 1,500 mL/hr at 01/06/17 0741    Scheduled Medications: . bisacodyl  10 mg Oral Daily  . Chlorhexidine Gluconate Cloth  6 each Topical Daily  . darbepoetin (ARANESP) injection - NON-DIALYSIS  150 mcg Subcutaneous Q Tue-1800  . ferrous sulfate  325 mg Oral TID WC  . mouth rinse  15 mL Mouth Rinse BID  . oxybutynin  5 mg Oral BID  . senna-docusate  2 tablet Oral QHS  . sodium chloride flush  10-40 mL Intracatheter Q12H  . sodium chloride flush  3 mL Intravenous Q12H  . sodium phosphate  Dextrose 5% IVPB  30 mmol Intravenous Q1200  . vancomycin  1,500 mg Intravenous to XRAY  Background: medical history of morbid obesity / wheelchair bound, untreated OHS, chronic combined systolic / diastolic CHF, NICM, AF on coumadin, HTN, 3L O2 dependent, pulmonary hypertension, former smoker (11 pk years) with COPD & CKD III. Left fibular fracture late 2017.  She was recently admitted from 1/28-11/13/16 for acute CHF exacerbation and presents again 12/27/16 with recurrent congestive heart failure (RV failure)/AF and acute on chronic renal failure. CRRT initiated 3/21 for volume overload. (Baseline creatinine 1.6-1.9 w/intermittent prior episodes AKI on CKD, creatinines as high as 4.4 on prior admissions). Creatinine 3.46 at initiation of CRRT. (3/21-3/29  Assessment/Plan:   1. Renal- AKI on CKD  (creatinine on 2/3 was 1.97) 1. Due to cardiorenal syndrome/hemodynamic etiology as UA is fairly bland and renal ultrasound normal.  2. Failed milrinone/diuretics in an ICU setting so CRRT initiated 3/21 (through 3/29).  3. Excellent results,  probably as much off right now as she can tolerate - so will stop CRRT today 4. I think she will be dialysis dependent 5. Needs temp cath changed to [facility] (have asked IR - will be done today) 6. Mild hyperkalemia - 5.3 - watch carefully. Renal diet. 2. Vol overload/anasarca  1. Vol much improved w/CRRT  2. Weight is down from 187.8 kg (414 lb) on 3/21 to today's weight of 135.6 kg (299 lb) - 115 lb!!!!! - about as much as she can tolerate at this time so stopping CRRT 3. HOPE that BP will be adequate for IHD 3. Anemia   1. Transfused 3/24, 3/25.  2. Got Feraheme 3/21 for low tsat  3. Aranesp 150 QTuesday   4. Hb stable low 8's 4. Hypophosphatemia 1. Resolved and w/stopping CRRT will stop daily replacement 5. AonC dCHF - milrinone/volume removal 6. RV failure - for RHC once vol optimized 7. AF/RVR - amio/dilt 8. OSA/OHV - requiring BIPAP for sleep  Jamal Maes, MD [place] Pager 01/06/2017, 8:53 AM

## 2017-01-06 NOTE — Progress Notes (Signed)
qPhysical Therapy Treatment Patient Details Name: Paige Marshall MRN: [id number] DOB: 08-11-1956 Today's Date: 01/06/2017    History of Present Illness 61 y.o. female with morbid obesity / wheelchair bound, untreated OHS, chronic combined systolic / diastolic CHF, NICM, AF on coumadin, HTN, 3L O2 dependent, pulmonary hypertension, former smoker (11 pk years) with COPD & CKD III.  The patient suffered a left fibular fracture in late 2017.  She was recently admitted from 1/28-11/13/16 for acute CHF exacerbation. Admitted 12/26/16 from SNF with acute CHF. HD catheter placed 12/29/16    PT Comments    Pt off CRRT and is currently NPO awaiting procedure for HD catheter placement. She was agreeable to bed exercises. HR 132 at rest and remaining 132-133 during session. O2 93% at rest and desat to 85% during exercise with 1-minute recovery time.     Follow Up Recommendations  SNF;Supervision/Assistance - 24 hour     Equipment Recommendations  None recommended by PT    Recommendations for Other Services       Precautions / Restrictions Precautions Precautions: Fall;Other (comment) Precaution Comments: watch HR    Mobility  Bed Mobility               General bed mobility comments: not addressed  Transfers                 General transfer comment: unable, hoyer transfer bed to w/c at SNF PTA  Ambulation/Gait             General Gait Details: unable   Stairs            Wheelchair Mobility    Modified Rankin (Stroke Patients Only)       Balance                                            Cognition Arousal/Alertness: Awake/alert Behavior During Therapy: WFL for tasks assessed/performed Overall Cognitive Status: Within Functional Limits for tasks assessed                                        Exercises General Exercises - Upper Extremity Shoulder Flexion: AROM;Both;10 reps;Supine Shoulder Horizontal  ABduction: AROM;Both;10 reps;Supine General Exercises - Lower Extremity Ankle Circles/Pumps: AROM;20 reps;Both;Supine Gluteal Sets: AROM;Both;10 reps Heel Slides: AAROM;Right;Left;10 reps;Supine Hip ABduction/ADduction: AAROM;Right;Left;10 reps;Supine Straight Leg Raises: AAROM;Right;Left;10 reps;Supine Other Exercises Other Exercises: bridging x 5 reps min assist    General Comments        Pertinent Vitals/Pain Pain Assessment: 0-10 Pain Score: 4  Pain Location: R knee with exercises Pain Descriptors / Indicators: Sore Pain Intervention(s): Monitored during session    Home Living                      Prior Function            PT Goals (current goals can now be found in the care plan section) Acute Rehab PT Goals Patient Stated Goal: to walk PT Goal Formulation: With patient Time For Goal Achievement: 01/17/17 Potential to Achieve Goals: Good Progress towards PT goals: Not progressing toward goals - comment (unable to progress OOB due to med status and scheduled procedures)    Frequency    Min 3X/week      PT Plan Current  plan remains appropriate    Co-evaluation             End of Session Equipment Utilized During Treatment: Oxygen Activity Tolerance: Treatment limited secondary to medical complications (Comment) (tachy, desat, NPO status for procedure) Patient left: in bed;with call bell/phone within reach Nurse Communication: Mobility status;Need for lift equipment PT Visit Diagnosis: Muscle weakness (generalized) (M62.81);Difficulty in walking, not elsewhere classified (R26.2)     Time: 1213-1227 PT Time Calculation (min) (ACUTE ONLY): 14 min  Charges:  $Therapeutic Exercise: 8-22 mins                    G Codes:       Lorrin Goodell, PT  Office # [phone] Pager [phone]    Lorriane Shire 01/06/2017, 12:47 PM

## 2017-01-06 NOTE — Progress Notes (Signed)
ANTICOAGULATION CONSULT NOTE - Follow Up Consult  Pharmacy Consult for heparin Indication: atrial fibrillation  Labs:  Recent Labs  01/03/17 0353  01/04/17 0510  01/04/17 1621 01/05/17 0355 01/05/17 0356 01/05/17 0800 01/05/17 1406 01/05/17 1604 01/06/17 0042  HGB 8.1*  --  7.9*  --   --   --   --  8.0*  --   --   --   HCT 27.8*  --  26.4*  --   --   --   --  27.9*  --   --   --   PLT 118*  --  110*  --   --   --   --  127*  --   --   --   HEPARINUNFRC 0.39  --   --   < > 0.30  --  0.23*  --  0.17*  --  0.17*  CREATININE 1.70*  < > 1.77*  --  1.86* 1.75*  --   --   --  1.78*  --   < > = values in this interval not displayed.   Assessment: 61yo female remains subtherapeutic on heparin with no change in heparin level despite bolus and rate increase; no gtt issues or signs of bleeding per RN.  Goal of Therapy:  Heparin level 0.3-0.7 units/ml   Plan:  Will rebolus with heparin 3000 units and increase gtt by 3 units/kg/hr to 3200 units/hr and check level in 6-8hr.  Wynona Neat, PharmD, BCPS  01/06/2017,1:33 AM

## 2017-01-06 NOTE — Progress Notes (Signed)
Advanced Heart Failure Rounding Note  PCP: EDWARDS, Milford Cage, NP  Primary Cardiologist: Dr. Gwenlyn Found   Subjective:    Admitted with volume overload, RV failure. Milrinone 0.64mcg started 3/19. Started CRRT 12/29/16.   Remains on CVVHD pulling 250-300 mL/hr. Feels good. Weight 299# - has lost 115 pounds  Coox initially 40% this am. Repeated 51%  on milrinone 0.25 mcg/kg/min.  CVP 8. BP now soft in 80s.    Denies SOB, CP. HR remains 130 despite 3 doses of ivabradine. Appears to be atrial tach vs flutter. (underwent DC-CV of AF -> ?MAT on 3/27). Still with occasional NSVT overnight.   Hgb 8.4 s/p 2u PRBCs on 3/24 . No overt bleeding.   For tunneled cath today per Renal     Objective:   Weight Range: 135.6 kg (299 lb) Body mass index is 46.14 kg/m.   Vital Signs:   Temp:  [97.8 F (36.6 C)-98.8 F (37.1 C)] 98.7 F (37.1 C) (03/29 0400) Pulse Rate:  [121-131] 121 (03/29 0700) Resp:  [15-36] 15 (03/29 0700) BP: (75-171)/(42-137) 89/77 (03/29 0700) SpO2:  [89 %-99 %] 98 % (03/29 0700) FiO2 (%):  [40 %] 40 % (03/29 0400) Weight:  [135.6 kg (299 lb)] 135.6 kg (299 lb) (03/29 0500) Last BM Date: 01/04/17  Weight change: Filed Weights   01/04/17 0500 01/05/17 0500 01/06/17 0500  Weight: (!) 152 kg (335 lb) (!) 140.6 kg (310 lb) 135.6 kg (299 lb)    Intake/Output:   Intake/Output Summary (Last 24 hours) at 01/06/17 0746 Last data filed at 01/06/17 0700  Gross per 24 hour  Intake          1758.71 ml  Output             8186 ml  Net         -6427.29 ml     Physical Exam: CVP 8 Personally reviewed General: Obese, lying in bed. On nasal cannula  HEENT: Normal. Anicteric on [place] Neck: Supple. JVP difficult due to body habitus. Carotids 2+ bilat; no bruits LIJ trailysis   Cor: PMI non-palpable. Distant. Tachy mildly regular  Lungs: Decreased throughout.  No rhonchi or wheeze  Abdomen: Morbidly obese, NT, nondistended , no HSM. No bruits or masses. +BS   Extremities: no  cyanosis, clubbing, rash. Trace -1+ edema Neuro: Alert & oriented x 3. Cranial nerves grossly intact. Moves all 4 extremities w/o difficulty. Affect pleasant    Telemetry: Probable atrial tach vs flutter 130. Occasiona runs of NSVT overnight. Personally reviewed     Labs: CBC  Recent Labs  01/05/17 0800 01/06/17 0529  WBC 7.9 8.7  HGB 8.0* 8.4*  HCT 27.9* 27.9*  MCV 89.1 89.4  PLT 127* 937*   Basic Metabolic Panel  Recent Labs  01/05/17 0355 01/05/17 1604 01/06/17 0529  NA 137 137 134*  K 4.0 5.1 5.3*  CL 100* 100* 99*  CO2 25 27 26   GLUCOSE 77 104* 101*  BUN 9 10 10   CREATININE 1.75* 1.78* 1.76*  CALCIUM 8.2* 8.7* 8.6*  MG 2.2  --  2.5*  PHOS 1.9*  1.8* 1.7* 2.7    BNP: BNP (last 3 results)  Recent Labs  09/18/16 1126 11/07/16 1633 12/26/16 1643  BNP 991.0* 490.4* 1,116.0*    Medications:     Scheduled Medications: . bisacodyl  10 mg Oral Daily  . Chlorhexidine Gluconate Cloth  6 each Topical Daily  . darbepoetin (ARANESP) injection - NON-DIALYSIS  150 mcg Subcutaneous Q Tue-1800  .  ferrous sulfate  325 mg Oral TID WC  . ivabradine  5 mg Oral BID WC  . mouth rinse  15 mL Mouth Rinse BID  . oxybutynin  5 mg Oral BID  . senna-docusate  2 tablet Oral QHS  . sodium chloride flush  10-40 mL Intracatheter Q12H  . sodium chloride flush  3 mL Intravenous Q12H  . sodium phosphate  Dextrose 5% IVPB  30 mmol Intravenous Q1200  . vancomycin  1,500 mg Intravenous to XRAY    Infusions: . amiodarone 30 mg/hr (01/06/17 0319)  . diltiazem (CARDIZEM) infusion 10 mg/hr (01/06/17 0605)  . heparin 3,200 Units/hr (01/06/17 3833)  . milrinone 0.25 mcg/kg/min (01/06/17 3832)  . dialysis replacement fluid (prismasate) 300 mL/hr at 01/05/17 1804  . dialysis replacement fluid (prismasate) 300 mL/hr at 01/05/17 1804  . dialysate (PRISMASATE) 1,500 mL/hr at 01/06/17 0741    PRN Medications: sodium chloride, acetaminophen, ALPRAZolam, heparin, ondansetron (ZOFRAN)  IV, oxyCODONE, polyethylene glycol, promethazine, sodium chloride, sodium chloride, sodium chloride flush, sodium chloride flush, sorbitol   Assessment/Plan   1. Acute on chronic diastolic CHF 2. Paroxysmal atrial fibrillation on Coumadin    -s/p DC-CV on 3/27 3. Acute on chronic kidney disease stage IV 4. Obesity hypoventilation syndrome.  5. Hyperkalemia 6. Acute RV failure/cor pulmonale 7. OSA, noncompliant with CPAP 8. Acute on chronic respiratory failure 9. Anemia 10. NSVT  She is down 115 pounds with CVVHD. CBP now soft. I think this is about as dry as we are going to get her. Likely can stop CVVHD. Willa wait Renal's input. Plan for tunneled cath today. She is ok with possibility of long-term HD.   She is s/p DCCV 3/27. Rhythm looks like AFL/atrial tach. No change with ivabradine. Will ask EP to see regarding possible ablation or AV node ablation   Co-ox low despite milrinone. Will continue for now. Hopefully will improve off CVVHD. Will need RHC probably tomorrow.   Continues with NSVT. Follow K and mag.   Hgb stable.   Will need PT to see to mobilize now that off CVVHD.   Glori Bickers, MD  7:46 AM   Length of Stay: 11  Glori Bickers, MD  01/06/2017, 7:46 AM  Advanced Heart Failure Team  Pager [phone] M-F 7am-4pm.  Please contact [facility] for night-coverage after hours (4p -7a ) and weekends on [email or web address]

## 2017-01-06 NOTE — Sedation Documentation (Signed)
Patient is resting comfortably. 

## 2017-01-06 NOTE — Consult Note (Signed)
ELECTROPHYSIOLOGY CONSULT NOTE    [name] MRN: [id number], DOB/AGE: Jan 18, 1956 61 y.o.  Admit date: 12/26/2016 Date of Consult: 01/06/2017  Primary Physician: Kerin Perna, NP Primary Cardiologist: Watt Climes MD: Tracy  Reason for Consultation: atrial arrhythmias  HPI:  Paige Marshall is a 61 y.o. female with a past medical history significant for morbid obesity, HTN, tobacco abuse, paroxysmal atrial fibrillation (on Rhythmol PTA), OSA, COPD, CKD, NICM. She presented to the hospital on the day of admission with worsening shortness of breath and weight gain. She is mostly wheelchair bound at SNF prior to admission. She has been diuresed and has required CRRT this admission. She is now at dry weight but will require long term HD for volume management. She has also been in atrial fibrillation and was placed on IV amiodarone. She was cardioverted on 3/23, and has been in atypical atrial flutter since. She has been maintained on IV amiodarone and cardizem drip with no improvement in rate control.  EP has been asked to see for treatment options.   She currently states that she is feeling much improved. She denies recent fevers, chills, nausea or vomiting. She has not had frank syncope or dizziness.  She reports compliance with CPAP.   Echo this admission demonstrated EF 50-55%, no RWMA, RA severely dilated, LA 54.   Past Medical History:  Diagnosis Date  . Asthma   . Chronic combined systolic and diastolic CHF (congestive heart failure) ([place])   . CKD (chronic kidney disease), stage III   . COPD (chronic obstructive pulmonary disease) ([place])   . Degenerative joint disease of both lower legs   . Former smoker   . Gout    hx in "both knees" (01/14/2015)  . H. pylori infection 01/03/2014   +breath test.    . High cholesterol   . Hypertension   . Morbid obesity ([place])   . NICM (nonischemic cardiomyopathy) ([place])    a. LHC 01/2015: normal cors, EF  45-50%. b. EF 50-55% in 08/2016.  [name] OSA (obstructive sleep apnea)   . Persistent atrial fibrillation ([place])   . Pneumonia X 2  . Pulmonary hypertension   . Rheumatoid arthritis ([place])   . Stroke [facility])      Surgical History:  Past Surgical History:  Procedure Laterality Date  . ANGIOGRAM/LV (CONGENITAL)  2007  . BREATH TEK H PYLORI N/A 12/31/2013   Procedure: [place];  Surgeon: Gayland Curry, MD;  Location: [facility];  Service: General;  Laterality: N/A;  . CORNEAL TRANSPLANT Right ~ 2010  . CORONARY ANGIOPLASTY WITH STENT PLACEMENT  11/04/2005   "1"  . DOPPLER ECHOCARDIOGRAPHY     2 D   EF of 45%  . EYE SURGERY    . LEFT HEART CATHETERIZATION WITH CORONARY ANGIOGRAM N/A 01/20/2015   Procedure: LEFT HEART CATHETERIZATION WITH CORONARY ANGIOGRAM;  Surgeon: Lorretta Harp, MD;  Location: [facility] CATH LAB;  Service: Cardiovascular;  Laterality: N/A;  . sleep study  2011  . stress myocardial dipyridamole perfusion    . TUBAL LIGATION  1982  . venous duplex ultrasound  2013     Prescriptions Prior to Admission  Medication Sig Dispense Refill Last Dose  . ARTIFICIAL TEAR OP Apply 1 drop to eye every 6 (six) hours as needed (dry eyes).    at prn  . carvedilol (COREG) 6.25 MG tablet Take 1 tablet (6.25 mg total) by mouth 2 (two) times daily with a meal.  12/26/2016 at 0900  . diclofenac sodium (VOLTAREN) 1 % GEL Apply 4 g topically every 6 (six) hours as needed (pain).     at prn  . diltiazem (DILACOR XR) 180 MG 24 hr capsule Take 180 mg by mouth daily.   12/26/2016 at 0900  . docusate sodium (COLACE) 100 MG capsule Take 1 capsule (100 mg total) by mouth 2 (two) times daily. 10 capsule 0 12/26/2016 at 0900  . ferrous sulfate 325 (65 FE) MG tablet Take 1 tablet (325 mg total) by mouth 3 (three) times daily with meals.  3 12/26/2016 at 0900  . furosemide (LASIX) 40 MG tablet Take 1 tablet (40 mg total) by mouth 2 (two) times daily. (Patient taking differently: Take 80 mg by mouth 2  (two) times daily. ) 60 tablet 0 12/26/2016 at 0900  . metolazone (ZAROXOLYN) 2.5 MG tablet Take 1 tablet (2.5 mg total) by mouth daily. 90 tablet 1 12/26/2016 at 0830  . Multiple Vitamin (MULTIVITAMIN WITH MINERALS) TABS tablet Take 1 tablet by mouth daily.   12/26/2016 at 0900  . omega-3 acid ethyl esters (LOVAZA) 1 g capsule Take 1 g by mouth 2 (two) times daily.   12/26/2016 at 0900  . oxyCODONE (OXY IR/ROXICODONE) 5 MG immediate release tablet Take 1 tablet (5 mg total) by mouth every 6 (six) hours as needed for breakthrough pain. 15 tablet 0 12/25/2016 at 1537  . polyethylene glycol (MIRALAX / GLYCOLAX) packet Take 17 g by mouth daily as needed. (Patient taking differently: Take 17 g by mouth daily as needed (constipation). Mix in 8 oz liquid and drink) 14 each 0  at prn  . potassium chloride SA (K-DUR,KLOR-CON) 20 MEQ tablet Take 40 mEq by mouth 3 (three) times daily.    12/26/2016 at 1300  . predniSONE (DELTASONE) 10 MG tablet Take 40 mg tablet 11/14/2016 and taper down by 10 mg daily until completed 15 tablet 0 12/25/2016 at 1700  . PROAIR HFA 108 (90 Base) MCG/ACT inhaler INHALE 2 PUFFS EVERY 6 HOURS AS NEEDED FOR WHEEZING OR SHORTNESS OF BREATH. 8.5 Inhaler 1  at prn  . promethazine (PHENERGAN) 25 MG tablet Take 25 mg by mouth every 6 (six) hours as needed for nausea or vomiting.    at prn  . propafenone (RYTHMOL) 150 MG tablet Take 1 tablet (150 mg total) by mouth every 8 (eight) hours. 10 tablet 0 12/26/2016 at 0800  . senna-docusate (SENNA S) 8.6-50 MG tablet Take 2 tablets by mouth See admin instructions. Take 2 tablet by mouth daily at bedtime, may also take 2 tablets as needed for constipation   12/25/2016 at 2100  . warfarin (COUMADIN) 5 MG tablet Take 5 mg by mouth daily.   12/25/2016 at 1700  . zolpidem (AMBIEN) 5 MG tablet Take 5 mg by mouth at bedtime as needed for sleep.    at prn  . bisacodyl (DULCOLAX) 5 MG EC tablet Take 1 tablet (5 mg total) by mouth daily as needed for moderate  constipation. (Patient not taking: Reported on 12/26/2016) 30 tablet 0 Not Taking at Unknown time  . PRESCRIPTION MEDICATION Inhale into the lungs at bedtime. BIPAP   Taking    Inpatient Medications:  . bisacodyl  10 mg Oral Daily  . Chlorhexidine Gluconate Cloth  6 each Topical Daily  . darbepoetin (ARANESP) injection - NON-DIALYSIS  150 mcg Subcutaneous Q Tue-1800  . ferrous sulfate  325 mg Oral TID WC  . mouth rinse  15 mL Mouth Rinse BID  .  senna-docusate  2 tablet Oral QHS  . sodium chloride flush  10-40 mL Intracatheter Q12H  . sodium chloride flush  3 mL Intravenous Q12H  . vancomycin  1,500 mg Intravenous to XRAY    Allergies:  Allergies  Allergen Reactions  . Penicillins Hives, Itching, Swelling and Other (See Comments)    Tongue swelling Has patient had a PCN reaction causing immediate rash, facial/tongue/throat swelling, SOB or lightheadedness with hypotension: Yes  Clarified with the patient her penicillin allergy. Pt has tolerated cefepime in the past. Pt also states that she can take amoxicillin.    . Citrus Hives    Social History   Social History  . Marital status: Widowed    Spouse name: N/A  . Number of children: N/A  . Years of education: N/A   Occupational History  . Not on file.   Social History Main Topics  . Smoking status: Former Smoker    Packs/day: 0.33    Years: 35.00    Types: Cigarettes    Quit date: 10/11/2008  . Smokeless tobacco: Never Used  . Alcohol use No     Comment: 01/14/2015 "~ 4, 24oz cans beer/wk"  . Drug use: No  . Sexual activity: No   Other Topics Concern  . Not on file   Social History Narrative  . No narrative on file     Family History  Problem Relation Age of Onset  . Heart disease Mother   . Cancer Father     colon  . Hypertension Other      Review of Systems: All other systems reviewed and are otherwise negative except as noted above.  Physical Exam: Vitals:   01/06/17 0700 01/06/17 0800 01/06/17 0854  01/06/17 0900  BP: (!) 89/77 (!) 79/63 (!) 79/63 (!) 91/53  Pulse: (!) 121 (!) 131 (!) 132 (!) 132  Resp: 15 (!) 25 (!) 21 17  Temp:  98.5 F (36.9 C)    TempSrc:  Oral    SpO2: 98% 90% 93% 94%  Weight:      Height:        GEN- The patient is morbidly obese appearing, alert and oriented x 3 today.   HEENT: normocephalic, atraumatic; sclera clear, conjunctiva pink; hearing intact; oropharynx clear; neck supple, +LIJ Lungs- Clear to ausculation bilaterally, normal work of breathing.  No wheezes, rales, rhonchi Heart- Tachycardic egular rate and rhythm  GI- soft, non-tender, non-distended, bowel sounds present  Extremities- no clubbing, cyanosis, 1+ BLE edema  MS- no significant deformity or atrophy Skin- warm and dry, no rash or lesion Psych- euthymic mood, full affect Neuro- strength and sensation are intact  Labs:   Lab Results  Component Value Date   WBC 8.7 01/06/2017   HGB 8.4 (L) 01/06/2017   HCT 27.9 (L) 01/06/2017   MCV 89.4 01/06/2017   PLT 141 (L) 01/06/2017     Recent Labs Lab 01/06/17 0529  NA 134*  K 5.3*  CL 99*  CO2 26  BUN 10  CREATININE 1.76*  CALCIUM 8.6*  GLUCOSE 101*      Radiology/Studies: Dg Chest Port 1 View Result Date: 01/04/2017 CLINICAL DATA:  Pulmonary edema. EXAM: PORTABLE CHEST 1 VIEW COMPARISON:  01/02/2017. FINDINGS: Left IJ line, right subclavian line in stable position. Stable cardiomegaly. Diffuse bilateral pulmonary infiltrates consistent with pulmonary edema again noted. Bilateral pneumonia cannot be excluded. Small left pleural effusion again noted. IMPRESSION: 1.  Central lines in stable position. 2. Persistent cardiomegaly with diffuse bilateral pulmonary infiltrates  consistent pulmonary edema. Small left pleural effusion. These findings consistent CHF. Bilateral pneumonia cannot be excluded. No change from prior exam. Electronically Signed   By: Marcello Moores  Register   On: 01/04/2017 07:17   EKG: atypical atrial flutter, v rate  132bpm, personally reviewed  TELEMETRY: atrial flutter, V rate 130's personally reviewed   Assessment/Plan; 1.  Persistent atrial fibrillation/atypical atrial flutter The patient has persistent atrial arrhythmias that are worsening right heart failure and will likely lead to LV dysfunction in the future if rates not controlled.  She has been on IV amiodarone for several days and underwent DCCV for atrial fibrillation and has been in atrial flutter since.  LA is 54 by echo this admission. Options include keeping on IV amiodarone through the weekend and trying another DCCV vs flutter ablation (and leaving on amiodarone for control of AF) vs AVN ablation and PPM (would avoid if at all possible with HD access and significantly increased risk for infection).  Dr Lovena Le to discuss with Dr Haroldine Laws today She will need long term [place] for [place] of 4 (warfarin with CKD)  2.  Acute on chronic diastolic heart failure She is down 115 pounds with CVVHD Continued management per AHF team  3.  Morbid obesity Body mass index is 46.14 kg/m. Weight loss will be required for long term prognosis  4.  OSA Compliance with CPAP encouraged  5.  NSVT Asymptomatic   Signed, Chanetta Marshall, NP 01/06/2017 10:09 AM  EP Attending  Patient seen and examined. Agree with above. The patient is a pleasant but chronically ill middle aged woman with ESRD, acute on chronic diastolic heart failure who was admitted with atrial fib and a RVR. She was cardioverted and placed on amiodarone and she has now reverted to incessant atrial flutter with 2:1 AV conduction. The patient is pending right heart cath and insertion of a HD catheter. She has class 3 dyspnea. Her fluid status has been difficult to manage. She does not have palpitations. Her exam reveals a regular tachy with rales in the bases and extremities demonstrate 2+ edema. Neuro is non-focal. Tele reveals atrial flutter with 2:1 AV conduction and ECG reveals  atypical atrial flutter with a RVR. A/P 1. 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 She has negative flutter waves in V1 and positive flutter waves inferiorly which would suggest the diagnosis above. I have discussed the treatment options with the patient and she wishes to proceed with ablation. With her pending right heart cath and HD access will plan on having her continue the IV amio and perform the procedure on Monday. If she reverts to NSR, then we would put this on hold. 2. Atrial fib - continue amio 3. Coags - continue anti-coagulation.   Mikle Bosworth.D.

## 2017-01-06 NOTE — Sedation Documentation (Signed)
MD aware of pts pressure, O2 mask applied 100% NRB placed to gets sats up before sedation

## 2017-01-06 NOTE — Progress Notes (Signed)
Patient resting comfortably on nasal cannula at this time.  bipap not indicated.  Will continue to monitor.

## 2017-01-06 NOTE — Care Management Note (Signed)
Case Management Note Previous note created by Berniece Salines 12/31/16  Patient Details  Name: GAYLIA KASSEL MRN: [id number] Date of Birth: 07-11-1956  Subjective/Objective:   CHF, pt currently on Milrinone gtt, Amiodarone gtt, Cardizem gtt                    Action/Plan: Discharge Planning: please see previous notes  Chart reviewed. Pt has been to SNF rehab at [facility].  Plan is dc to home with HH. Pt active with [facility] for HHPT, RN, OT and aide. Will need HH orders with [facility] prior to dc. [facility] Liaison, they will continue to follow post dc. Will continue to follow for dc needs.   PCP  [facility], [place]   Expected Discharge Date:                  Expected Discharge Plan:  [place]  In-House Referral:  Clinical Social Work  Discharge planning Services  CM Consult  Post Acute Care Choice:  Home Health Choice offered to:  Patient  DME Arranged:    DME Agency:     HH Arranged:  RN, PT, OT [place] Agency:  [place]  Status of Service:  In process, will continue to follow  If discussed at Long Length of Stay Meetings, dates discussed:    Additional Comments: 01/06/2017 Pt remains on multiple drips.  CM will continue to follow for discharge needs Maryclare Labrador, RN 01/06/2017, 11:01 AM

## 2017-01-06 NOTE — Progress Notes (Signed)
ANTICOAGULATION CONSULT NOTE - Follow Up Consult  Pharmacy Consult for Heparin Indication: atrial fibrillation  Allergies  Allergen Reactions  . Penicillins Hives, Itching, Swelling and Other (See Comments)    Tongue swelling Has patient had a PCN reaction causing immediate rash, facial/tongue/throat swelling, SOB or lightheadedness with hypotension: Yes  Clarified with the patient her penicillin allergy. Pt has tolerated cefepime in the past. Pt also states that she can take amoxicillin.    . Citrus Hives    Patient Measurements: Height: 5' 7.5" (171.5 cm) Weight: 299 lb (135.6 kg) IBW/kg (Calculated) : 62.75 Heparin Dosing Weight: 111kg  Vital Signs: Temp: 98.5 F (36.9 C) (03/29 0800) Temp Source: Oral (03/29 0800) BP: 81/56 (03/29 1000) Pulse Rate: 131 (03/29 1024)  Labs:  Recent Labs  01/04/17 0510  01/05/17 0355  01/05/17 0800 01/05/17 1406 01/05/17 1604 01/06/17 0042 01/06/17 0529 01/06/17 0845  HGB 7.9*  --   --   --  8.0*  --   --   --  8.4*  --   HCT 26.4*  --   --   --  27.9*  --   --   --  27.9*  --   PLT 110*  --   --   --  127*  --   --   --  141*  --   HEPARINUNFRC  --   < >  --   < >  --  0.17*  --  0.17*  --  0.49  CREATININE 1.77*  < > 1.75*  --   --   --  1.78*  --  1.76*  --   < > = values in this interval not displayed.  Estimated Creatinine Clearance: 50.5 mL/min (A) (by C-G formula based on SCr of 1.76 mg/dL (H)).   Medications:  Heparin @ 3200 units/hr  Assessment: 60yof on coumadin pta for afib, admitted with volume overload and renal failure. INR 2.84 on admit and coumadin initially resumed (received one dose of 5mg ). On 3/20, coumadin held, and she received 5mg  vitamin k to reverse INR anticipating need for CRRT. Heparin bridge started. Heparin level is therapeutic at 0.48. Hgb stable. No bleeding. To IR today for tunneled HD catheter placement (CRRT now off).  Goal of Therapy:  Heparin level 0.3-0.7 units/ml Monitor platelets by  anticoagulation protocol: Yes   Plan:  1) Continue heparin at 3200 units/hr 2) Follow up after IR procedure  Deboraha Sprang 01/06/2017,10:55 AM

## 2017-01-06 NOTE — Procedures (Signed)
Interventional Radiology Procedure Note  Procedure: Placement of a right IJ approach 19cm tip to cuff palindrome HD catheter.  Tip is positioned at the superior cavoatrial junction and catheter is ready for immediate use.  Complications: None Recommendations:  - Ok to shower tomorrow - Do not submerge - Routine line care   Signed,  Dulcy Fanny. Earleen Newport, DO

## 2017-01-06 NOTE — Progress Notes (Signed)
ANTICOAGULATION CONSULT NOTE - Follow Up Consult  Pharmacy Consult for Heparin Indication: atrial fibrillation  Allergies  Allergen Reactions  . Penicillins Hives, Itching, Swelling and Other (See Comments)    Tongue swelling Has patient had a PCN reaction causing immediate rash, facial/tongue/throat swelling, SOB or lightheadedness with hypotension: Yes  Clarified with the patient her penicillin allergy. Pt has tolerated cefepime in the past. Pt also states that she can take amoxicillin.    . Citrus Hives    Patient Measurements: Height: 5' 7.5" (171.5 cm) Weight: 299 lb (135.6 kg) IBW/kg (Calculated) : 62.75 Heparin Dosing Weight: 111kg  Vital Signs: Temp: 98.8 F (37.1 C) (03/29 1100) Temp Source: Oral (03/29 1100) BP: 119/42 (03/29 1633) Pulse Rate: 130 (03/29 1700)  Labs:  Recent Labs  01/04/17 0510  01/05/17 0355  01/05/17 0800 01/05/17 1406 01/05/17 1604 01/06/17 0042 01/06/17 0529 01/06/17 0845  HGB 7.9*  --   --   --  8.0*  --   --   --  8.4*  --   HCT 26.4*  --   --   --  27.9*  --   --   --  27.9*  --   PLT 110*  --   --   --  127*  --   --   --  141*  --   HEPARINUNFRC  --   < >  --   < >  --  0.17*  --  0.17*  --  0.49  CREATININE 1.77*  < > 1.75*  --   --   --  1.78*  --  1.76*  --   < > = values in this interval not displayed.  Estimated Creatinine Clearance: 50.5 mL/min (A) (by C-G formula based on SCr of 1.76 mg/dL (H)).   Medications (infusion):  . amiodarone 30 mg/hr (01/06/17 1451)  . diltiazem (CARDIZEM) infusion 10 mg/hr (01/06/17 0605)  . heparin    . milrinone 0.25 mcg/kg/min (01/06/17 1323)    Assessment: 60yof on coumadin pta for afib, admitted with volume overload and renal failure. INR reversed and heparin bridge started. Went to IR today for tunneled HD catheter placement, procedure end time ~16:30. Heparin to be resumed 6 hrs post procedure per post IR procedure consult. No bleeding noted. Last heparin level was therapeutic on  3200 units/hr.  Goal of Therapy:  Heparin level 0.3-0.7 units/ml Monitor platelets by anticoagulation protocol: Yes   Plan:  1) Resume heparin drip at 3200 units/hr without bolus at 22:30 2) 8 hr heparin level 3) Daily heparin level and CBC 4) Monitor for s/sx of bleeding   Renold Genta, PharmD, BCPS Clinical Pharmacist Phone for today - [place] - [phone] 01/06/2017 5:38 PM

## 2017-01-07 ENCOUNTER — Encounter (HOSPITAL_COMMUNITY)
Admission: EM | Disposition: A | Discharge status: Nursing Home (Includes ICF) | Payer: Self-pay | Source: Home / Self Care | Attending: Pulmonary Disease

## 2017-01-07 ENCOUNTER — Encounter (HOSPITAL_COMMUNITY): Payer: Self-pay | Admitting: Internal Medicine

## 2017-01-07 DIAGNOSIS — I4892 Unspecified atrial flutter: Secondary | ICD-10-CM

## 2017-01-07 HISTORY — PX: RIGHT HEART CATH: CATH118263

## 2017-01-07 LAB — CBC WITH DIFFERENTIAL/PLATELET
BASOS PCT: 0 %
Basophils Absolute: 0 10*3/uL (ref 0.0–0.1)
EOS ABS: 0.1 10*3/uL (ref 0.0–0.7)
Eosinophils Relative: 2 %
HCT: 26.2 % — ABNORMAL LOW (ref 36.0–46.0)
HEMOGLOBIN: 7.7 g/dL — AB (ref 12.0–15.0)
LYMPHS ABS: 1.5 10*3/uL (ref 0.7–4.0)
Lymphocytes Relative: 19 %
MCH: 26.3 pg (ref 26.0–34.0)
MCHC: 29.4 g/dL — ABNORMAL LOW (ref 30.0–36.0)
MCV: 89.4 fL (ref 78.0–100.0)
MONOS PCT: 8 %
Monocytes Absolute: 0.7 10*3/uL (ref 0.1–1.0)
NEUTROS ABS: 5.9 10*3/uL (ref 1.7–7.7)
Neutrophils Relative %: 71 %
PLATELETS: 130 10*3/uL — AB (ref 150–400)
RBC: 2.93 MIL/uL — AB (ref 3.87–5.11)
RDW: 27.4 % — ABNORMAL HIGH (ref 11.5–15.5)
WBC: 8.2 10*3/uL (ref 4.0–10.5)

## 2017-01-07 LAB — RENAL FUNCTION PANEL
ALBUMIN: 2.7 g/dL — AB (ref 3.5–5.0)
Anion gap: 11 (ref 5–15)
BUN: 15 mg/dL (ref 6–20)
CHLORIDE: 99 mmol/L — AB (ref 101–111)
CO2: 26 mmol/L (ref 22–32)
CREATININE: 3.11 mg/dL — AB (ref 0.44–1.00)
Calcium: 9 mg/dL (ref 8.9–10.3)
GFR, EST AFRICAN AMERICAN: 18 mL/min — AB (ref >60)
GFR, EST NON AFRICAN AMERICAN: 15 mL/min — AB (ref >60)
Glucose, Bld: 95 mg/dL (ref 65–99)
PHOSPHORUS: 2.8 mg/dL (ref 2.5–4.6)
Potassium: 4.1 mmol/L (ref 3.5–5.1)
Sodium: 136 mmol/L (ref 135–145)

## 2017-01-07 LAB — POCT I-STAT 3, VENOUS BLOOD GAS (G3P V)
ACID-BASE DEFICIT: 1 mmol/L (ref 0.0–2.0)
BICARBONATE: 24 mmol/L (ref 20.0–28.0)
Bicarbonate: 25 mmol/L (ref 20.0–28.0)
O2 Saturation: 40 %
O2 Saturation: 44 %
PH VEN: 7.39 (ref 7.250–7.430)
PO2 VEN: 25 mmHg — AB (ref 32.0–45.0)
TCO2: 25 mmol/L (ref 0–100)
TCO2: 26 mmol/L (ref 0–100)
pCO2, Ven: 39.7 mmHg — ABNORMAL LOW (ref 44.0–60.0)
pCO2, Ven: 42 mmHg — ABNORMAL LOW (ref 44.0–60.0)
pH, Ven: 7.383 (ref 7.250–7.430)
pO2, Ven: 23 mmHg — CL (ref 32.0–45.0)

## 2017-01-07 LAB — PROTIME-INR
INR: 1.5
PROTHROMBIN TIME: 18.3 s — AB (ref 11.4–15.2)

## 2017-01-07 LAB — HEPARIN LEVEL (UNFRACTIONATED): HEPARIN UNFRACTIONATED: 0.25 [IU]/mL — AB (ref 0.30–0.70)

## 2017-01-07 LAB — MAGNESIUM: MAGNESIUM: 2.5 mg/dL — AB (ref 1.7–2.4)

## 2017-01-07 LAB — COOXEMETRY PANEL
CARBOXYHEMOGLOBIN: 3 % — AB (ref 0.5–1.5)
METHEMOGLOBIN: 0.8 % (ref 0.0–1.5)
O2 SAT: 58.8 %
TOTAL HEMOGLOBIN: 8.3 g/dL — AB (ref 12.0–16.0)

## 2017-01-07 SURGERY — RIGHT HEART CATH
Anesthesia: LOCAL

## 2017-01-07 MED ORDER — FENTANYL CITRATE (PF) 100 MCG/2ML IJ SOLN
INTRAMUSCULAR | Status: AC
  Filled 2017-01-07: qty 2

## 2017-01-07 MED ORDER — SODIUM CHLORIDE 0.9 % IV SOLN: 250.0000 mL | INTRAVENOUS | Status: DC | PRN

## 2017-01-07 MED ORDER — ACETAMINOPHEN 325 MG PO TABS: 650.0000 mg | ORAL_TABLET | ORAL | Status: DC | PRN

## 2017-01-07 MED ORDER — SODIUM CHLORIDE 0.9% FLUSH: 3.0000 mL | INTRAVENOUS | Status: DC | PRN

## 2017-01-07 MED ORDER — LIDOCAINE HCL (PF) 1 % IJ SOLN
INTRAMUSCULAR | Status: AC
  Filled 2017-01-07: qty 30

## 2017-01-07 MED ORDER — ONDANSETRON HCL 4 MG/2ML IJ SOLN: 4.0000 mg | Freq: Four times a day (QID) | INTRAMUSCULAR | Status: DC | PRN

## 2017-01-07 MED ORDER — HEPARIN (PORCINE) IN NACL 100-0.45 UNIT/ML-% IJ SOLN
3400.0000 [IU]/h | INTRAMUSCULAR | Status: DC
  Administered 2017-01-08 – 2017-01-13 (×18): 3400 [IU]/h via INTRAVENOUS
  Filled 2017-01-07 (×17): qty 250

## 2017-01-07 MED ORDER — SODIUM CHLORIDE 0.9% FLUSH
3.0000 mL | Freq: Two times a day (BID) | INTRAVENOUS | Status: DC
  Administered 2017-01-07: 3 mL via INTRAVENOUS

## 2017-01-07 MED ORDER — ASPIRIN 81 MG PO CHEW
81.0000 mg | CHEWABLE_TABLET | Freq: Once | ORAL | Status: AC
  Administered 2017-01-07: 81 mg via ORAL

## 2017-01-07 MED ORDER — HEPARIN (PORCINE) IN NACL 2-0.9 UNIT/ML-% IJ SOLN
INTRAMUSCULAR | Status: DC | PRN
  Administered 2017-01-07: 1000 mL

## 2017-01-07 MED ORDER — FENTANYL CITRATE (PF) 100 MCG/2ML IJ SOLN
INTRAMUSCULAR | Status: DC | PRN
  Administered 2017-01-07 (×2): 25 ug via INTRAVENOUS

## 2017-01-07 MED ORDER — SODIUM CHLORIDE 0.9% FLUSH: 3.0000 mL | Freq: Two times a day (BID) | INTRAVENOUS | Status: DC

## 2017-01-07 MED ORDER — SODIUM CHLORIDE 0.9 % IV SOLN: INTRAVENOUS | Status: DC

## 2017-01-07 MED ORDER — LIDOCAINE HCL (PF) 1 % IJ SOLN
INTRAMUSCULAR | Status: DC | PRN
  Administered 2017-01-07: 2 mL

## 2017-01-07 MED ORDER — ASPIRIN 81 MG PO CHEW
CHEWABLE_TABLET | ORAL | Status: AC
  Administered 2017-01-07: 81 mg
  Filled 2017-01-07: qty 1

## 2017-01-07 SURGICAL SUPPLY — 9 items
CATH BALLN WEDGE 5F 110CM (CATHETERS) ×2 IMPLANT
COVER PRB 48X5XTLSCP FOLD TPE (BAG) ×1 IMPLANT
COVER PROBE 5X48 (BAG) ×1
HOVERMATT SINGLE USE (MISCELLANEOUS) ×2 IMPLANT
PACK CARDIAC CATHETERIZATION (CUSTOM PROCEDURE TRAY) ×2 IMPLANT
SHEATH GLIDE SLENDER 4/5FR (SHEATH) ×2 IMPLANT
TRANSDUCER W/STOPCOCK (MISCELLANEOUS) ×2 IMPLANT
TUBING ART PRESS 72  MALE/FEM (TUBING) ×1
TUBING ART PRESS 72 MALE/FEM (TUBING) ×1 IMPLANT

## 2017-01-07 NOTE — Progress Notes (Signed)
ANTICOAGULATION CONSULT NOTE - Follow Up Consult  Pharmacy Consult for Heparin Indication: atrial fibrillation  Allergies  Allergen Reactions  . Penicillins Hives, Itching, Swelling and Other (See Comments)    Tongue swelling Has patient had a PCN reaction causing immediate rash, facial/tongue/throat swelling, SOB or lightheadedness with hypotension: Yes  Clarified with the patient her penicillin allergy. Pt has tolerated cefepime in the past. Pt also states that she can take amoxicillin.    . Citrus Hives    Patient Measurements: Height: 5' 7.5" (171.5 cm) Weight: (!) 311 lb (141.1 kg) IBW/kg (Calculated) : 62.75 Heparin Dosing Weight: 111kg  Vital Signs: Temp: 98.5 F (36.9 C) (03/30 0500) Temp Source: Oral (03/30 0500) BP: 97/55 (03/30 0600) Pulse Rate: 126 (03/30 0600)  Labs:  Recent Labs  01/05/17 0800  01/05/17 1604 01/06/17 0042 01/06/17 0529 01/06/17 0845 01/06/17 1708 01/07/17 0544  HGB 8.0*  --   --   --  8.4*  --   --   --   HCT 27.9*  --   --   --  27.9*  --   --   --   PLT 127*  --   --   --  141*  --   --   --   HEPARINUNFRC  --   < >  --  0.17*  --  0.49  --  0.25*  CREATININE  --   --  1.78*  --  1.76*  --  2.31*  --   < > = values in this interval not displayed.  Estimated Creatinine Clearance: 39.4 mL/min (A) (by C-G formula based on SCr of 2.31 mg/dL (H)).  Assessment: 61 y.o. female with Afib, Coumadin on hold, for heparin.  No bleeding around new HD catheter per RN.  Goal of Therapy:  Heparin level 0.3-0.7 units/ml Monitor platelets by anticoagulation protocol: Yes   Plan:  Increase Heparin 3400 units/hr   Check heparin level in 8 hours.   Phillis Knack, PharmD, BCPS  01/07/2017 6:48 AM

## 2017-01-07 NOTE — Progress Notes (Signed)
[facility] Rounding Note Subjective:    s/p CRRT 3/21-3/29 with weight 414 lb->115 lb at d/c of the CRRT Scales suggest gained 5 lb overnight - not sure how Had R heart cath ->Moderate to severe PAH with RV failure despite milrinone therapy, normal left-sided pressures after 100+ pounds of volume removal with CVVHD No urine output Still AFF for ablation on Monday CVP 8-9   Objective Vital signs in last 24 hours: Vitals:   01/07/17 1104 01/07/17 1109 01/07/17 1141 01/07/17 1200  BP: 116/64 116/64  95/62  Pulse: (!) 135 (!) 120  (!) 128  Resp: 20 11  (!) 28  Temp:   97.7 F (36.5 C)   TempSrc:   Oral   SpO2: (!) 87% (!) 87%  93%  Weight:      Height:       Weight change: 3.374 kg (7 lb 7 oz)  Intake/Output Summary (Last 24 hours) at 01/07/17 1236 Last data filed at 01/07/17 1000  Gross per 24 hour  Intake           1134.6 ml  Output                0 ml  Net           1134.6 ml    Physical Exam: MO AAF VS as noted New R IJ TDC (3/29) Pleasant, wearing nasal cannula Tachy 120's AF Still w/mild tachypnea Anteriorly clear Pitting edema of abd wall, post thighs.  LE's below the knees all now just trace-1+ and MARKEDLY better  Labs:  Recent Labs Lab 01/06/17 0529 01/06/17 1708 01/07/17 0544  NA 134* 136 136  K 5.3* 4.8 4.1  CL 99* 102 99*  CO2 26 25 26   GLUCOSE 101* 92 95  BUN 10 12 15   CREATININE 1.76* 2.31* 3.11*  CALCIUM 8.6* 8.4* 9.0  PHOS 2.7 2.4* 2.8     Recent Labs Lab 01/06/17 0529 01/06/17 1708 01/07/17 0544  ALBUMIN 2.8* 2.7* 2.7*       Component Value Date/Time   IRON 64 12/29/2016 0444   TIBC 274 12/29/2016 0444   FERRITIN 115 12/29/2016 0444   IRONPCTSAT 23 12/29/2016 0444     Recent Labs Lab 01/03/17 0353 01/04/17 0510 01/05/17 0800 01/06/17 0529 01/07/17 0544  WBC 8.3 8.5 7.9 8.7 8.2  NEUTROABS  --   --   --   --  5.9  HGB 8.1* 7.9* 8.0* 8.4* 7.7*  HCT 27.8* 26.4* 27.9* 27.9* 26.2*  MCV 90.0 89.8 89.1 89.4 89.4  PLT 118*  110* 127* 141* 130*    Medications: Infusions: . amiodarone 30 mg/hr (01/07/17 0016)  . diltiazem (CARDIZEM) infusion 10 mg/hr (01/07/17 0015)  . heparin    . milrinone 0.25 mcg/kg/min (01/07/17 0654)    Scheduled Medications: . bisacodyl  10 mg Oral Daily  . Chlorhexidine Gluconate Cloth  6 each Topical Daily  . darbepoetin (ARANESP) injection - NON-DIALYSIS  150 mcg Subcutaneous Q Tue-1800  . ferrous sulfate  325 mg Oral TID WC  . mouth rinse  15 mL Mouth Rinse BID  . senna-docusate  2 tablet Oral QHS  . sodium chloride flush  10-40 mL Intracatheter Q12H  . sodium chloride flush  3 mL Intravenous Q12H   Background: medical history of morbid obesity / wheelchair bound, untreated OHS, chronic combined systolic / diastolic CHF, NICM, AF on coumadin, HTN, 3L O2 dependent, pulmonary hypertension, former smoker (11 pk years) with COPD & CKD III. Left fibular fracture late  2017. Admitted from 1/28-11/13/16 for acute CHF exacerbation and presents again 12/27/16 with recurrent congestive heart failure (RV failure)/AF and acute on chronic renal failure. CRRT initiated 3/21 for volume overload. (Baseline creatinine 1.6-1.9 w/intermittent prior episodes AKI on CKD, creatinines as high as 4.4 on prior admissions). Creatinine 3.46 at initiation of CRRT. CRRT 3/21-3/29 with removal 115 lb. [place] placed 3/29.   Assessment/Plan:  1. AKI on CKD 3/4  (creatinine on 2/3 was 1.97) due to cardiorenal syndrome/hemodynamic etiology as UA bland and renal ultrasound normal. Failed milrinone/diuretics. Had CRRT initiated 3/21 through 3/29 with weight down 115 lb. I think she will be dialysis dependent - in that vein TDC placed 3/29.  Anticipate 1st attempt at HD on Saturday. VVS consult next week for perm access. 2. Anemia  Transfused 3/24, 3/25. Got Feraheme 3/21 for low tsat . Aranesp 150 QTuesday   3. CKD-MBD - phos OK. Check PTH 4. AonC dCHF - Had R heart cath 3/30  ->Moderate to severe PAH with RV failure despite  milrinone therapy, normal left-sided pressures after 100+ pounds of volume removal with CVVHD 5. AF/RVR - amio/dilt - for AF ablation on Monday 6. OSA/OHV - requiring BIPAP for sleep  Jamal Maes, MD [place] Pager 01/07/2017, 12:36 PM

## 2017-01-07 NOTE — Progress Notes (Signed)
Pt. spontaneously converted to sinus tachycardia, as confirmed by EKG. Remains on amiodarone and Cardizem. Cards notified of change, no further orders at this time. Will continue to monitor.

## 2017-01-07 NOTE — Progress Notes (Addendum)
Received patient into 4N12 from ICU. Patient alert, oriented, and stable on arrival with oxygen at [facility]. Received with cardizem, milrinone, and amiodarone drips running.

## 2017-01-07 NOTE — Progress Notes (Signed)
PT Cancellation Note  Patient Details Name: Paige Marshall MRN: [id number] DOB: [birth year]   Cancelled Treatment:    Reason Eval/Treat Not Completed: Patient at procedure or test/unavailable;Other (comment). Attempted this AM and pt down for test. Attempted this PM and pt just receiving late lunch.    [place] 01/07/2017, 2:15 PM [place]

## 2017-01-07 NOTE — Progress Notes (Signed)
ANTICOAGULATION CONSULT NOTE - Follow Up Consult  Pharmacy Consult for Heparin Indication: atrial fibrillation  Allergies  Allergen Reactions  . Penicillins Hives, Itching, Swelling and Other (See Comments)    Tongue swelling Has patient had a PCN reaction causing immediate rash, facial/tongue/throat swelling, SOB or lightheadedness with hypotension: Yes  Clarified with the patient her penicillin allergy. Pt has tolerated cefepime in the past. Pt also states that she can take amoxicillin.    . Citrus Hives    Patient Measurements: Height: 5' 7.5" (171.5 cm) Weight: (!) 311 lb (141.1 kg) IBW/kg (Calculated) : 62.75 Heparin Dosing Weight: 111kg  Vital Signs: Temp: 97.7 F (36.5 C) (03/30 0755) Temp Source: Oral (03/30 0755) BP: 116/64 (03/30 1109) Pulse Rate: 120 (03/30 1109)  Labs:  Recent Labs  01/05/17 0800  01/06/17 0042 01/06/17 0529 01/06/17 0845 01/06/17 1708 01/07/17 0544 01/07/17 0941  HGB 8.0*  --   --  8.4*  --   --  7.7*  --   HCT 27.9*  --   --  27.9*  --   --  26.2*  --   PLT 127*  --   --  141*  --   --  130*  --   LABPROT  --   --   --   --   --   --   --  18.3*  INR  --   --   --   --   --   --   --  1.50  HEPARINUNFRC  --   < > 0.17*  --  0.49  --  0.25*  --   CREATININE  --   < >  --  1.76*  --  2.31* 3.11*  --   < > = values in this interval not displayed.  Estimated Creatinine Clearance: 29.3 mL/min (A) (by C-G formula based on SCr of 3.11 mg/dL (H)).  Assessment: 60yof on coumadin pta for afib, admitted with volume overload and renal failure. INR 2.84 on admit and coumadin initially resumed (received one dose of 5mg ). On 3/20, coumadin held, and she received 5mg  vitamin k to reverse INR anticipating need for CRRT. Heparin bridge started.  Heparin resumed s/p tunneled HD cath placement yesterday. S/P right heart cath today and heparin to resume 8 hours post sheath removal. Brachial sheath removed ~ 1100. Heparin level was low this morning and  rate increased to 3400 units/hr - follow up level never obtained as she went for cath.   Goal of Therapy:  Heparin level 0.3-0.7 units/ml Monitor platelets by anticoagulation protocol: Yes   Plan:  1) At 1900 tonight, resume heparin at 3400 units/hr 2) Daily heparin level and CBC  Deboraha Sprang 01/07/2017,11:24 AM

## 2017-01-07 NOTE — Progress Notes (Signed)
PULMONARY / CRITICAL CARE MEDICINE   Name: Paige Marshall MRN: [id number] DOB: 1956/04/30    ADMISSION DATE:  12/26/2016 CONSULTATION DATE:  12/30/16  REFERRING MD:  Dr. Tommy Medal   CHIEF COMPLAINT:  Shortness of breath   HISTORY OF PRESENT ILLNESS:  61 y/o F admitted from rehab facility (resident since November 2017) on 3/18 with complaints of shortness of breath & lower extremity swelling.    The patient has a medical history of morbid obesity / wheelchair bound, untreated OHS, chronic combined systolic / diastolic CHF, NICM, AF on coumadin, HTN, 3L O2 dependent, pulmonary hypertension, former smoker (11 pk years) with COPD & CKD III.  The patient suffered a left fibular fracture in late 2017.  She was recently admitted from 1/28-11/13/16 for acute CHF exacerbation.  At that time, her discharge weight was 370lbs.    She returned from the rehab facility on 3/18 with increased SOB and lower extremity swelling.  She if followed by Dr. Gwenlyn Found for cardiology and was recently switched to lasix and metolazone.  Lasix was discontinued at the SNF due to rising sr cr.  Her admit weight was 411lbs.  She was admitted by IMTS for decompensated CHF.  She developed worsening AKI with concern for cardio-renal syndrome.  An HD catheter was inserted 3/21 and CVVHD was initiated.   She remains on milrinone, amiodarone and heparin gtt's.  Rate control has been difficult to achieve.  Over the past 24 hours, she is net negative 3.5L.  She developed respiratory distress am of 3/22 and BiPAP was initiated.    PCCM consulted for evaluation.   SUBJECTIVE:  Neg balance continued Off cvvhd Remains on cardizem, amio, milrinone, hep Tachy 125  VITAL SIGNS: BP (!) 97/55   Pulse (!) 126   Temp 97.7 F (36.5 C) (Oral)   Resp 14   Ht 5' 7.5" (1.715 m)   Wt (!) 141.1 kg (311 lb)   LMP 09/07/2011   SpO2 94%   BMI 47.99 kg/m   HEMODYNAMICS: CVP:  [6 mmHg-8 mmHg] 8 mmHg  VENTILATOR SETTINGS:    INTAKE /  OUTPUT: I/O last 3 completed shifts: In: 2201.1 [P.O.:30; I.V.:2171.1] Out: 4622 [Other:4622]  PHYSICAL EXAMINATION: Gen:      No acute distress, morbid obesity, off BiPAP  HEENT:  No ett, jvd down Neck:      Supple, no masses or JVD Lungs:    Distant  CTA CV:         s1 s2 Tachy, -M/R/G. Abd:      Soft, NT, ND and +BS Ext:    Edema improving but present Skin:       Warm and dry; no rash Neuro:     Alert and oriented x 3  LABS:  BMET  Recent Labs Lab 01/06/17 0529 01/06/17 1708 01/07/17 0544  NA 134* 136 136  K 5.3* 4.8 4.1  CL 99* 102 99*  CO2 26 25 26   BUN 10 12 15   CREATININE 1.76* 2.31* 3.11*  GLUCOSE 101* 92 95   Electrolytes  Recent Labs Lab 01/05/17 0355  01/06/17 0529 01/06/17 1708 01/07/17 0544  CALCIUM 8.2*  < > 8.6* 8.4* 9.0  MG 2.2  --  2.5*  --  2.5*  PHOS 1.9*  1.8*  < > 2.7 2.4* 2.8  < > = values in this interval not displayed.  CBC  Recent Labs Lab 01/05/17 0800 01/06/17 0529 01/07/17 0544  WBC 7.9 8.7 8.2  HGB 8.0* 8.4* 7.7*  HCT 27.9* 27.9* 26.2*  PLT 127* 141* 130*   Coag's No results for input(s): APTT, INR in the last 168 hours. Sepsis Markers No results for input(s): LATICACIDVEN, PROCALCITON, O2SATVEN in the last 168 hours.  ABG No results for input(s): PHART, PCO2ART, PO2ART in the last 168 hours. Liver Enzymes  Recent Labs Lab 01/06/17 0529 01/06/17 1708 01/07/17 0544  ALBUMIN 2.8* 2.7* 2.7*   Cardiac Enzymes No results for input(s): TROPONINI, PROBNP in the last 168 hours.  Glucose  Recent Labs Lab 01/04/17 1538  GLUCAP 111*   Imaging Ir Fluoro Guide Cv Line Right  Result Date: 01/06/2017 INDICATION: 61 year old female with renal failure EXAM: TUNNELED CENTRAL VENOUS HEMODIALYSIS CATHETER PLACEMENT WITH ULTRASOUND AND FLUOROSCOPIC GUIDANCE MEDICATIONS: Vancomycin 1.5 gm IV . The antibiotic was given in an appropriate time interval prior to skin puncture. ANESTHESIA/SEDATION: Moderate (conscious) sedation  was employed during this procedure. A total of Versed 2 point mg and Fentanyl 100 mcg was administered intravenously. Moderate Sedation Time: 20 minutes. The patient's level of consciousness and vital signs were monitored continuously by radiology nursing throughout the procedure under my direct supervision. FLUOROSCOPY TIME:  Fluoroscopy Time: 0 minutes 12 seconds (2 mGy). COMPLICATIONS: None PROCEDURE: Informed written consent was obtained from the patient after a discussion of the risks, benefits, and alternatives to treatment. Questions regarding the procedure were encouraged and answered. The right neck and chest were prepped with chlorhexidine in a sterile fashion, and a sterile drape was applied covering the operative field. Maximum barrier sterile technique with sterile gowns and gloves were used for the procedure. A timeout was performed prior to the initiation of the procedure. After creating a small venotomy incision, a micropuncture kit was utilized to access the right internal jugular vein under direct, real-time ultrasound guidance after the overlying soft tissues were anesthetized with 1% lidocaine with epinephrine. Ultrasound image documentation was performed. The microwire was marked to measure appropriate internal catheter length. External tunneled length was estimated. A total tip to cuff length of 19 cm was selected. Skin and subcutaneous tissues of chest wall below the clavicle were generously infiltrated with 1% lidocaine for local anesthesia. A small stab incision was made with 11 blade scalpel. The selected hemodialysis catheter was tunneled in a retrograde fashion from the anterior chest wall to the venotomy incision. A guidewire was advanced to the level of the IVC and the micropuncture sheath was exchanged for a peel-away sheath. The catheter was then placed through the peel-away sheath with tips ultimately positioned within the superior aspect of the right atrium. Final catheter  positioning was confirmed and documented with a spot radiographic image. The catheter aspirates and flushes normally. The catheter was flushed with appropriate volume heparin dwells. The catheter exit site was secured with a 0-Prolene retention suture. The venotomy incision was closed Derma bond and sterile dressing. Dressings were applied at the chest wall. Patient tolerated the procedure well and remained hemodynamically stable throughout. No complications were encountered and no significant blood loss encountered. IMPRESSION: Status post right IJ tunneled hemodialysis catheter, 19 cm tip to cuff. Catheter ready for use. Signed, Dulcy Fanny. Earleen Newport, DO [facility] Radiology Electronically Signed   By: Corrie Mckusick D.O.   On: 01/06/2017 17:38   Ir US Guide Vasc Access Right  Result Date: 01/06/2017 INDICATION: 61 year old female with renal failure EXAM: TUNNELED CENTRAL VENOUS HEMODIALYSIS CATHETER PLACEMENT WITH ULTRASOUND AND FLUOROSCOPIC GUIDANCE MEDICATIONS: Vancomycin 1.5 gm IV . The antibiotic was given in an appropriate  time interval prior to skin puncture. ANESTHESIA/SEDATION: Moderate (conscious) sedation was employed during this procedure. A total of Versed 2 point mg and Fentanyl 100 mcg was administered intravenously. Moderate Sedation Time: 20 minutes. The patient's level of consciousness and vital signs were monitored continuously by radiology nursing throughout the procedure under my direct supervision. FLUOROSCOPY TIME:  Fluoroscopy Time: 0 minutes 12 seconds (2 mGy). COMPLICATIONS: None PROCEDURE: Informed written consent was obtained from the patient after a discussion of the risks, benefits, and alternatives to treatment. Questions regarding the procedure were encouraged and answered. The right neck and chest were prepped with chlorhexidine in a sterile fashion, and a sterile drape was applied covering the operative field. Maximum barrier sterile  technique with sterile gowns and gloves were used for the procedure. A timeout was performed prior to the initiation of the procedure. After creating a small venotomy incision, a micropuncture kit was utilized to access the right internal jugular vein under direct, real-time ultrasound guidance after the overlying soft tissues were anesthetized with 1% lidocaine with epinephrine. Ultrasound image documentation was performed. The microwire was marked to measure appropriate internal catheter length. External tunneled length was estimated. A total tip to cuff length of 19 cm was selected. Skin and subcutaneous tissues of chest wall below the clavicle were generously infiltrated with 1% lidocaine for local anesthesia. A small stab incision was made with 11 blade scalpel. The selected hemodialysis catheter was tunneled in a retrograde fashion from the anterior chest wall to the venotomy incision. A guidewire was advanced to the level of the IVC and the micropuncture sheath was exchanged for a peel-away sheath. The catheter was then placed through the peel-away sheath with tips ultimately positioned within the superior aspect of the right atrium. Final catheter positioning was confirmed and documented with a spot radiographic image. The catheter aspirates and flushes normally. The catheter was flushed with appropriate volume heparin dwells. The catheter exit site was secured with a 0-Prolene retention suture. The venotomy incision was closed Derma bond and sterile dressing. Dressings were applied at the chest wall. Patient tolerated the procedure well and remained hemodynamically stable throughout. No complications were encountered and no significant blood loss encountered. IMPRESSION: Status post right IJ tunneled hemodialysis catheter, 19 cm tip to cuff. Catheter ready for use. Signed, Dulcy Fanny. Earleen Newport, DO [facility] Radiology Electronically Signed   By: Corrie Mckusick  D.O.   On: 01/06/2017 17:38   STUDIES:  US Renal 3/18 >> normal appearance of kidneys, moderate lower abdominal ascites CXR 3/22>> unchanged edema, bilateral effusions. I have reviewed the images personally.  CULTURES: None  ANTIBIOTICS: None  SIGNIFICANT EVENTS: 3/18  Admit with SOB, increased LE swelling > decompensated CHF  3/21  CVVHD initiated  3/22  PCCM consulted for respiratory distress   LINES/TUBES: R [place] TLC 3/19 >>  L IJ HD Cath 3/21 >>   I reviewed CXR mylself, cardiomegally and pulmonary edema noted.  DISCUSSION: 61 y/o F admitted with decompensated CHF, volume overload, AKI with concern for cardiorenal syndrome.  CVVHD in progress.  Tolerating NIV/Bipap.   ASSESSMENT / PLAN:  PULMONARY A: Acute on Chronic Hypoxemic Hypercapneic Respiratory Failure 2/2 Volume overload/Pulmonary edema + CHF exacerbation pEF + OSA + OHS + Pulm HTN Pt with h/o asthma/copd but it is not in exacerbation P:   - CPAP - Lower O2 to sats goal -neg balance successful   CARDIOVASCULAR A:  AF with RVR - on coumadin  Acute on Chronic Diastolic  CHF with Volume Overload Acute RV Failure / Cor Pulmonale  HTN  P:  - On Cardizem and heparin gtt - Milrinone back on  - [place] per cards - EP recs to follow - MAP is okay  RENAL A:   Acute on Chronic Renal Failure - suspect cardiorenal syndrome, renal US normal Hyperkalemia P:   - CVVH off today, has cathter for scheduled HD  GASTROINTESTINAL A:   Morbid Obesity  P:   - PO diet - Heart healthy, carb modified diet - Fluid restriction  HEMATOLOGIC A:   Anemia - suspect in setting of CKD + frequent phlebotomy  Got 2 units PRBC per cardiology 3/24. No evidence of bleed. Chronic Anticoagulation P:  - Follow CBC - Continue heparin anticoagulation  INFECTIOUS A:   No acute infectious process  P:   - Observe off antibiotics  ENDOCRINE A:   No acute issues  P:   - Follow glucose on BMP  NEUROLOGIC A:   Anxiety   P:   - PRN Xanax for anxiety.  Consider to primary service chf, will discuss  Lavon Paganini. Titus Mould, MD, [place] Pgr: [facility]

## 2017-01-07 NOTE — Progress Notes (Signed)
Advanced Heart Failure Rounding Note  PCP: EDWARDS, Milford Cage, NP  Primary Cardiologist: Dr. Gwenlyn Found   Subjective:    Admitted with volume overload, RV failure. Milrinone 0.68mcg started 3/19. Started CRRT 12/29/16.   CVVHD stopped yesterday. Tunneled cath placed for IHD. Weight shows up 5 lbs. Creatinine 3.11  Coox 58.8% on milrinone 0.25 mcg/kg/min.  CVP 8-9  Feeling OK this am. Anxious about cath.  Not sure she can lay flat. Does not want cover laid over/around her face.    Remains in AFL rates at 120. Breathing ok. Trialysis cath out. Weight down over 100 pounds.  Hgb 7.7 s/p 2u PRBCs on 3/24. No overt bleeding. On Aranesp.  s/p tunneled cath placement 01/06/17  Objective:   Weight Range: (!) 311 lb (141.1 kg) Body mass index is 47.99 kg/m.   Vital Signs:   Temp:  [97.7 F (36.5 C)-98.8 F (37.1 C)] 97.7 F (36.5 C) (03/30 0755) Pulse Rate:  [126-133] 126 (03/30 0600) Resp:  [14-28] 14 (03/30 0600) BP: (79-120)/(42-81) 97/55 (03/30 0600) SpO2:  [84 %-100 %] 94 % (03/30 0600) Weight:  [306 lb 7 oz (139 kg)-311 lb (141.1 kg)] 311 lb (141.1 kg) (03/30 0500) Last BM Date: 01/06/17  Weight change: Filed Weights   01/06/17 0500 01/06/17 1940 01/07/17 0500  Weight: 299 lb (135.6 kg) (!) 306 lb 7 oz (139 kg) (!) 311 lb (141.1 kg)    Intake/Output:   Intake/Output Summary (Last 24 hours) at 01/07/17 0832 Last data filed at 01/07/17 0600  Gross per 24 hour  Intake           1166.3 ml  Output                0 ml  Net           1166.3 ml     Physical Exam: CVP 9 Personally reviewed General: Obese. Lying in bed.   HEENT: Normal  anictreic  Neck: Supple. JVP difficult. Carotids 2+ bilat; no bruits LIJ trailysis has been removed Cor: PMI non-palpable. Distant. Tachy. Regular. No murmur Lungs: Diminished throughout.  No wheeze Abdomen: Morbidly obese, NT, ND, no HSM. No bruits or masses. +BS  Extremities: no cyanosis, clubbing, rash. Chronic Trace- 1+ edema.    Neuro: Alert & oriented x 3. Cranial nerves grossly intact. Moves all 4 extremities w/o difficulty. Affect pleasant      Telemetry: Atrial flutter 120s Personally reviewed   Labs: CBC  Recent Labs  01/06/17 0529 01/07/17 0544  WBC 8.7 8.2  NEUTROABS  --  5.9  HGB 8.4* 7.7*  HCT 27.9* 26.2*  MCV 89.4 89.4  PLT 141* 128*   Basic Metabolic Panel  Recent Labs  01/06/17 0529 01/06/17 1708 01/07/17 0544  NA 134* 136 136  K 5.3* 4.8 4.1  CL 99* 102 99*  CO2 26 25 26   GLUCOSE 101* 92 95  BUN 10 12 15   CREATININE 1.76* 2.31* 3.11*  CALCIUM 8.6* 8.4* 9.0  MG 2.5*  --  2.5*  PHOS 2.7 2.4* 2.8    BNP: BNP (last 3 results)  Recent Labs  09/18/16 1126 11/07/16 1633 12/26/16 1643  BNP 991.0* 490.4* 1,116.0*    Medications:     Scheduled Medications: . bisacodyl  10 mg Oral Daily  . Chlorhexidine Gluconate Cloth  6 each Topical Daily  . darbepoetin (ARANESP) injection - NON-DIALYSIS  150 mcg Subcutaneous Q Tue-1800  . ferrous sulfate  325 mg Oral TID WC  . mouth rinse  15 mL Mouth Rinse BID  . senna-docusate  2 tablet Oral QHS  . sodium chloride flush  10-40 mL Intracatheter Q12H  . sodium chloride flush  3 mL Intravenous Q12H    Infusions: . amiodarone 30 mg/hr (01/07/17 0016)  . diltiazem (CARDIZEM) infusion 10 mg/hr (01/07/17 0015)  . heparin 3,400 Units/hr (01/07/17 2641)  . milrinone 0.25 mcg/kg/min (01/07/17 0654)    PRN Medications: sodium chloride, acetaminophen, ALPRAZolam, ondansetron (ZOFRAN) IV, oxyCODONE, polyethylene glycol, promethazine, sodium chloride, sodium chloride flush, sodium chloride flush, sorbitol   Assessment/Plan   1. Acute on chronic diastolic CHF 2. Paroxysmal atrial fibrillation on Coumadin    -s/p DC-CV on 3/27 3. Acute on chronic kidney disease stage IV 4. Obesity hypoventilation syndrome.  5. Hyperkalemia 6. Acute RV failure/cor pulmonale 7. OSA, noncompliant with CPAP 8. Acute on chronic respiratory failure 9.  Anemia 10. NSVT  She is down 110 pounds with CVVHD. Mostly optimized for RHC.   Renal following. s/p tunneled cath 01/06/17.  Diuretics per renal.  She is s/p DCCV 3/27. Rhythm looks like AFL/atrial tach. No change with ivabradine. Tentatively plan for EP 01/12/17 for AFL ablation.        Co-ox marginal on milrinone.  Volume status mostly optimized. Plan for RHC today.   Continues with NSVT. Follow K and mag.   Hgb stable.   PT pending. Needs to mobilize as tolerated.    Length of Stay: [address]  Annamaria Helling  01/07/2017, 8:32 AM  Advanced Heart Failure Team  Pager [phone] M-F 7am-4pm.  Please contact [facility] for night-coverage after hours (4p -7a ) and weekends on [email or web address]  Patient seen and examined with Oda Kilts, PA-C. We discussed all aspects of the encounter. I agree with the assessment and plan as stated above.   Now down over 100 pounds with HD. Trialysis cath has been removed and tunneled cath placed for iHD.  Remains on milrinone. Co-ox borderline. Will proceed with RHC today to further evaluate  Remains in AFL despite > 1 week of IV amio. Plan AFL ablation on Monday. Continue AC. Discussed with Dr. Lovena Le  Occasional NSVT. We are supping electrolytes as needed.   Can go back to SDU. IMTS to resume primary care. Discussed with Dr. Titus Mould.    Glori Bickers, MD  10:42 AM

## 2017-01-07 NOTE — H&P (View-Only) (Signed)
Advanced Heart Failure Rounding Note  PCP: EDWARDS, Milford Cage, NP  Primary Cardiologist: Dr. Gwenlyn Found   Subjective:    Admitted with volume overload, RV failure. Milrinone 0.54mcg started 3/19. Started CRRT 12/29/16.   Remains on CVVHD pulling 250-300 mL/hr. Feels good. Weight 299# - has lost 115 pounds  Coox initially 40% this am. Repeated 51%  on milrinone 0.25 mcg/kg/min.  CVP 8. BP now soft in 80s.    Denies SOB, CP. HR remains 130 despite 3 doses of ivabradine. Appears to be atrial tach vs flutter. (underwent DC-CV of AF -> ?MAT on 3/27). Still with occasional NSVT overnight.   Hgb 8.4 s/p 2u PRBCs on 3/24 . No overt bleeding.   For tunneled cath today per Renal     Objective:   Weight Range: 135.6 kg (299 lb) Body mass index is 46.14 kg/m.   Vital Signs:   Temp:  [97.8 F (36.6 C)-98.8 F (37.1 C)] 98.7 F (37.1 C) (03/29 0400) Pulse Rate:  [121-131] 121 (03/29 0700) Resp:  [15-36] 15 (03/29 0700) BP: (75-171)/(42-137) 89/77 (03/29 0700) SpO2:  [89 %-99 %] 98 % (03/29 0700) FiO2 (%):  [40 %] 40 % (03/29 0400) Weight:  [135.6 kg (299 lb)] 135.6 kg (299 lb) (03/29 0500) Last BM Date: 01/04/17  Weight change: Filed Weights   01/04/17 0500 01/05/17 0500 01/06/17 0500  Weight: (!) 152 kg (335 lb) (!) 140.6 kg (310 lb) 135.6 kg (299 lb)    Intake/Output:   Intake/Output Summary (Last 24 hours) at 01/06/17 0746 Last data filed at 01/06/17 0700  Gross per 24 hour  Intake          1758.71 ml  Output             8186 ml  Net         -6427.29 ml     Physical Exam: CVP 8 Personally reviewed General: Obese, lying in bed. On nasal cannula  HEENT: Normal. Anicteric on  Neck: Supple. JVP difficult due to body habitus. Carotids 2+ bilat; no bruits LIJ trailysis   Cor: PMI non-palpable. Distant. Tachy mildly regular  Lungs: Decreased throughout.  No rhonchi or wheeze  Abdomen: Morbidly obese, NT, nondistended , no HSM. No bruits or masses. +BS   Extremities: no  cyanosis, clubbing, rash. Trace -1+ edema Neuro: Alert & oriented x 3. Cranial nerves grossly intact. Moves all 4 extremities w/o difficulty. Affect pleasant    Telemetry: Probable atrial tach vs flutter 130. Occasiona runs of NSVT overnight. Personally reviewed     Labs: CBC  Recent Labs  01/05/17 0800 01/06/17 0529  WBC 7.9 8.7  HGB 8.0* 8.4*  HCT 27.9* 27.9*  MCV 89.1 89.4  PLT 127* 161*   Basic Metabolic Panel  Recent Labs  01/05/17 0355 01/05/17 1604 01/06/17 0529  NA 137 137 134*  K 4.0 5.1 5.3*  CL 100* 100* 99*  CO2 25 27 26   GLUCOSE 77 104* 101*  BUN 9 10 10   CREATININE 1.75* 1.78* 1.76*  CALCIUM 8.2* 8.7* 8.6*  MG 2.2  --  2.5*  PHOS 1.9*  1.8* 1.7* 2.7    BNP: BNP (last 3 results)  Recent Labs  09/18/16 1126 11/07/16 1633 12/26/16 1643  BNP 991.0* 490.4* 1,116.0*    Medications:     Scheduled Medications: . bisacodyl  10 mg Oral Daily  . Chlorhexidine Gluconate Cloth  6 each Topical Daily  . darbepoetin (ARANESP) injection - NON-DIALYSIS  150 mcg Subcutaneous Q Tue-1800  .  ferrous sulfate  325 mg Oral TID WC  . ivabradine  5 mg Oral BID WC  . mouth rinse  15 mL Mouth Rinse BID  . oxybutynin  5 mg Oral BID  . senna-docusate  2 tablet Oral QHS  . sodium chloride flush  10-40 mL Intracatheter Q12H  . sodium chloride flush  3 mL Intravenous Q12H  . sodium phosphate  Dextrose 5% IVPB  30 mmol Intravenous Q1200  . vancomycin  1,500 mg Intravenous to XRAY    Infusions: . amiodarone 30 mg/hr (01/06/17 0319)  . diltiazem (CARDIZEM) infusion 10 mg/hr (01/06/17 0605)  . heparin 3,200 Units/hr (01/06/17 6389)  . milrinone 0.25 mcg/kg/min (01/06/17 3734)  . dialysis replacement fluid (prismasate) 300 mL/hr at 01/05/17 1804  . dialysis replacement fluid (prismasate) 300 mL/hr at 01/05/17 1804  . dialysate (PRISMASATE) 1,500 mL/hr at 01/06/17 0741    PRN Medications: sodium chloride, acetaminophen, ALPRAZolam, heparin, ondansetron (ZOFRAN)  IV, oxyCODONE, polyethylene glycol, promethazine, sodium chloride, sodium chloride, sodium chloride flush, sodium chloride flush, sorbitol   Assessment/Plan   1. Acute on chronic diastolic CHF 2. Paroxysmal atrial fibrillation on Coumadin    -s/p DC-CV on 3/27 3. Acute on chronic kidney disease stage IV 4. Obesity hypoventilation syndrome.  5. Hyperkalemia 6. Acute RV failure/cor pulmonale 7. OSA, noncompliant with CPAP 8. Acute on chronic respiratory failure 9. Anemia 10. NSVT  She is down 115 pounds with CVVHD. CBP now soft. I think this is about as dry as we are going to get her. Likely can stop CVVHD. Willa wait Renal's input. Plan for tunneled cath today. She is ok with possibility of long-term HD.   She is s/p DCCV 3/27. Rhythm looks like AFL/atrial tach. No change with ivabradine. Will ask EP to see regarding possible ablation or AV node ablation   Co-ox low despite milrinone. Will continue for now. Hopefully will improve off CVVHD. Will need RHC probably tomorrow.   Continues with NSVT. Follow K and mag.   Hgb stable.   Will need PT to see to mobilize now that off CVVHD.   Glori Bickers, MD  7:46 AM   Length of Stay: 11  Glori Bickers, MD  01/06/2017, 7:46 AM  Advanced Heart Failure Team  Pager [phone] M-F 7am-4pm.  Please contact [facility] for night-coverage after hours (4p -7a ) and weekends on [email or web address]

## 2017-01-07 NOTE — Interval H&P Note (Signed)
History and Physical Interval Note:  01/07/2017 10:38 AM  Paige Marshall  has presented today for surgery, with the diagnosis of heart failure  The various methods of treatment have been discussed with the patient and family. After consideration of risks, benefits and other options for treatment, the patient has consented to  Procedure(s): Right Heart Cath (N/A) as a surgical intervention .  The patient's history has been reviewed, patient examined, no change in status, stable for surgery.  I have reviewed the patient's chart and labs.  Questions were answered to the patient's satisfaction.     Marshayla Mitschke, Quillian Quince

## 2017-01-08 DIAGNOSIS — I2729 Other secondary pulmonary hypertension: Secondary | ICD-10-CM

## 2017-01-08 DIAGNOSIS — G8929 Other chronic pain: Secondary | ICD-10-CM

## 2017-01-08 DIAGNOSIS — K5900 Constipation, unspecified: Secondary | ICD-10-CM

## 2017-01-08 DIAGNOSIS — J9620 Acute and chronic respiratory failure, unspecified whether with hypoxia or hypercapnia: Secondary | ICD-10-CM

## 2017-01-08 DIAGNOSIS — D631 Anemia in chronic kidney disease: Secondary | ICD-10-CM

## 2017-01-08 DIAGNOSIS — Z992 Dependence on renal dialysis: Secondary | ICD-10-CM

## 2017-01-08 DIAGNOSIS — Z79899 Other long term (current) drug therapy: Secondary | ICD-10-CM

## 2017-01-08 DIAGNOSIS — Z9119 Patient's noncompliance with other medical treatment and regimen: Secondary | ICD-10-CM

## 2017-01-08 DIAGNOSIS — F419 Anxiety disorder, unspecified: Secondary | ICD-10-CM

## 2017-01-08 DIAGNOSIS — N186 End stage renal disease: Secondary | ICD-10-CM

## 2017-01-08 DIAGNOSIS — Z79891 Long term (current) use of opiate analgesic: Secondary | ICD-10-CM

## 2017-01-08 LAB — COMPREHENSIVE METABOLIC PANEL
ALBUMIN: 2.7 g/dL — AB (ref 3.5–5.0)
ALK PHOS: 64 U/L (ref 38–126)
ALT: 17 U/L (ref 14–54)
AST: 22 U/L (ref 15–41)
Anion gap: 9 (ref 5–15)
BUN: 10 mg/dL (ref 6–20)
CALCIUM: 8.3 mg/dL — AB (ref 8.9–10.3)
CO2: 27 mmol/L (ref 22–32)
CREATININE: 2.58 mg/dL — AB (ref 0.44–1.00)
Chloride: 96 mmol/L — ABNORMAL LOW (ref 101–111)
GFR calc Af Amer: 22 mL/min — ABNORMAL LOW (ref >60)
GFR calc non Af Amer: 19 mL/min — ABNORMAL LOW (ref >60)
GLUCOSE: 99 mg/dL (ref 65–99)
Potassium: 3.2 mmol/L — ABNORMAL LOW (ref 3.5–5.1)
SODIUM: 132 mmol/L — AB (ref 135–145)
Total Bilirubin: 2 mg/dL — ABNORMAL HIGH (ref 0.3–1.2)
Total Protein: 5.8 g/dL — ABNORMAL LOW (ref 6.5–8.1)

## 2017-01-08 LAB — CBC
HCT: 24.4 % — ABNORMAL LOW (ref 36.0–46.0)
Hemoglobin: 7.3 g/dL — ABNORMAL LOW (ref 12.0–15.0)
MCH: 26.7 pg (ref 26.0–34.0)
MCHC: 29.9 g/dL — AB (ref 30.0–36.0)
MCV: 89.4 fL (ref 78.0–100.0)
PLATELETS: 139 10*3/uL — AB (ref 150–400)
RBC: 2.73 MIL/uL — AB (ref 3.87–5.11)
RDW: 28 % — ABNORMAL HIGH (ref 11.5–15.5)
WBC: 9 10*3/uL (ref 4.0–10.5)

## 2017-01-08 LAB — RENAL FUNCTION PANEL
Albumin: 2.5 g/dL — ABNORMAL LOW (ref 3.5–5.0)
Anion gap: 11 (ref 5–15)
BUN: 27 mg/dL — AB (ref 6–20)
CHLORIDE: 100 mmol/L — AB (ref 101–111)
CO2: 24 mmol/L (ref 22–32)
CREATININE: 4.43 mg/dL — AB (ref 0.44–1.00)
Calcium: 8.8 mg/dL — ABNORMAL LOW (ref 8.9–10.3)
GFR calc Af Amer: 12 mL/min — ABNORMAL LOW (ref >60)
GFR calc non Af Amer: 10 mL/min — ABNORMAL LOW (ref >60)
Glucose, Bld: 94 mg/dL (ref 65–99)
Phosphorus: 2.6 mg/dL (ref 2.5–4.6)
Potassium: 4.1 mmol/L (ref 3.5–5.1)
Sodium: 135 mmol/L (ref 135–145)

## 2017-01-08 LAB — MAGNESIUM: Magnesium: 1.9 mg/dL (ref 1.7–2.4)

## 2017-01-08 LAB — BLOOD GAS, ARTERIAL
Acid-Base Excess: 3.6 mmol/L — ABNORMAL HIGH (ref 0.0–2.0)
Bicarbonate: 27.5 mmol/L (ref 20.0–28.0)
Drawn by: 236041
FIO2: 100
O2 SAT: 88.6 %
PATIENT TEMPERATURE: 98.6
PO2 ART: 51.7 mmHg — AB (ref 83.0–108.0)
pCO2 arterial: 41.2 mmHg (ref 32.0–48.0)
pH, Arterial: 7.44 (ref 7.350–7.450)

## 2017-01-08 LAB — COOXEMETRY PANEL
CARBOXYHEMOGLOBIN: 3.1 % — AB (ref 0.5–1.5)
Carboxyhemoglobin: 2.9 % — ABNORMAL HIGH (ref 0.5–1.5)
METHEMOGLOBIN: 1 % (ref 0.0–1.5)
Methemoglobin: 0.8 % (ref 0.0–1.5)
O2 SAT: 51.8 %
O2 SAT: 47.5 %
Total hemoglobin: 7.4 g/dL — ABNORMAL LOW (ref 12.0–16.0)
Total hemoglobin: 7.3 g/dL — ABNORMAL LOW (ref 12.0–16.0)

## 2017-01-08 LAB — HEPARIN LEVEL (UNFRACTIONATED)
Heparin Unfractionated: 0.36 IU/mL (ref 0.30–0.70)
Heparin Unfractionated: 0.39 IU/mL (ref 0.30–0.70)

## 2017-01-08 LAB — PREPARE RBC (CROSSMATCH)

## 2017-01-08 LAB — PARATHYROID HORMONE, INTACT (NO CA): PTH: 24 pg/mL (ref 15–65)

## 2017-01-08 MED ORDER — AMIODARONE IV BOLUS ONLY 150 MG/100ML
150.0000 mg | Freq: Once | INTRAVENOUS | Status: AC
  Administered 2017-01-08: 150 mg via INTRAVENOUS

## 2017-01-08 MED ORDER — SODIUM CHLORIDE 0.9 % IV BOLUS (SEPSIS)
250.0000 mL | Freq: Once | INTRAVENOUS | Status: AC
  Administered 2017-01-08: 250 mL via INTRAVENOUS

## 2017-01-08 MED ORDER — SODIUM CHLORIDE 0.9 % IV SOLN: Freq: Once | INTRAVENOUS | Status: DC

## 2017-01-08 MED ORDER — DILTIAZEM HCL 30 MG PO TABS
30.0000 mg | ORAL_TABLET | Freq: Four times a day (QID) | ORAL | Status: DC
  Administered 2017-01-08 (×2): 30 mg via ORAL
  Filled 2017-01-08 (×3): qty 1

## 2017-01-08 MED ORDER — MILRINONE LACTATE IN DEXTROSE 20-5 MG/100ML-% IV SOLN
0.1250 ug/kg/min | INTRAVENOUS | Status: DC
  Administered 2017-01-08: 0.125 ug/kg/min via INTRAVENOUS
  Filled 2017-01-08: qty 100

## 2017-01-08 NOTE — Progress Notes (Addendum)
Dr. Tommi Rumps asked to come to pt bedside. Pt HR now sustaining in 130s, afib, BP readings are low. Doppler MAP obtained at around 65. BP readings affected by irregular HR. Pt is A&O, slightly more sleepy than prior assessment this evening. MD orders to get coox, bolus 150 IV Amio and 250cc NS bolus over 51min. Will administer as ordered and continue to monitor.   MD notified of coox results, orders to check ABG. Will continue to monitor closely.   Rapid RN appraised of situation as well.

## 2017-01-08 NOTE — Progress Notes (Signed)
Internal Medicine Attending:   I saw and examined the patient. I reviewed the resident's note and I agree with the resident's findings and plan as documented in the resident's note.  Patient was admitted for decompensated heart failure on 12/26/2016 and developed acute on chronic renal failure secondary to likely cardiorenal syndrome and has remained anuric since then. Her course was complicated by respiratory distress on March 22 which required BiPAP initiation and transfer to the ICU. Patient was started on milrinone, amiodarone, Cardizem have been drip over the last 2 weeks and is remained in persistent A. flutter despite treatment. She has diuresed approximately 110 pounds of fluid with CRRT. This was stopped on 01/06/2017 and a tunneled HD cath was placed. She also underwent a right heart cath on 01/07/2017 which showed severe pulmonary hypertension and right ventricular failure with normal left-sided pressures. She was transferred back to our service today.  Today patient feels well with no new complaints and appears to be breathing comfortably. She is scheduled for first hemodialysis session today to be done at bedside. It is uncertain whether her borderline pressures will tolerate hemodialysis at this point. We will continue to monitor. Nephrology and heart failure follow-up and recommendations appreciated. We'll wean milrinone drip per heart failure recommendations. Can DC diltiazem drip and start oral diltiazem. We'll repeat EKG today as patient may have converted to multifocal atrial tachycardia from a flutter. She will need ablation of a flutter if she goes back to this rhythm. She still is on an amiodarone drip which may be converted to by mouth amiodarone in the morning. We will continue to monitor closely.

## 2017-01-08 NOTE — Progress Notes (Signed)
ANTICOAGULATION CONSULT NOTE - Follow Up Consult  Pharmacy Consult for Heparin  Indication: atrial fibrillation  Allergies  Allergen Reactions  . Penicillins Hives, Itching, Swelling and Other (See Comments)    Tongue swelling Has patient had a PCN reaction causing immediate rash, facial/tongue/throat swelling, SOB or lightheadedness with hypotension: Yes  Clarified with the patient her penicillin allergy. Pt has tolerated cefepime in the past. Pt also states that she can take amoxicillin.    . Citrus Hives   Patient Measurements: Height: 5' 7.5" (171.5 cm) Weight: (!) 313 lb (142 kg) IBW/kg (Calculated) : 62.75  Vital Signs: Temp: 98.7 F (37.1 C) (03/31 1319) Temp Source: Oral (03/31 1319) BP: 83/55 (03/31 1345) Pulse Rate: 112 (03/31 1345)  Labs:  Recent Labs  01/06/17 0529  01/06/17 1708 01/07/17 0544 01/07/17 0941 01/08/17 0400 01/08/17 1301  HGB 8.4*  --   --  7.7*  --  7.3*  --   HCT 27.9*  --   --  26.2*  --  24.4*  --   PLT 141*  --   --  130*  --  139*  --   LABPROT  --   --   --   --  18.3*  --   --   INR  --   --   --   --  1.50  --   --   HEPARINUNFRC  --   < >  --  0.25*  --  0.36 0.39  CREATININE 1.76*  --  2.31* 3.11*  --  4.43*  --   < > = values in this interval not displayed.  Estimated Creatinine Clearance: 20.7 mL/min (A) (by C-G formula based on SCr of 4.43 mg/dL (H)).  Assessment: 60yof on coumadin pta for afib, admitted with volume overload and renal failure. INR 2.84 on admit and coumadin initially resumed (received one dose of 5mg ). On 3/20, coumadin held, and she received 5mg  vitamin k to reverse INR anticipating need for CRRT. Heparin bridge started.  Heparin resumed s/p tunneled HD cath placement 3/29. S/P right heart cath 3/30 and heparin resumed 8 hours post sheath removal. Hgb trending down 7.7>7.3, PLT stable 139  Heparin level therapeutic: 0.39  Goal of Therapy:  Heparin level 0.3-0.7 units/ml Monitor platelets by  anticoagulation protocol: Yes   Plan:  Continue heparin gtt at 3400 unit/hr Daily heparin level/CBC Monitor for s/sx of bleeding  Georga Bora, PharmD Clinical Pharmacist Pager: [phone] 01/08/2017 2:04 PM

## 2017-01-08 NOTE — Progress Notes (Addendum)
Orders from Dr. Tommi Rumps to give second 250cc bolus, HR 120s afib, BP still soft, doppler maps ranging 65-80. ABG results given over phone as well. Placed on NRB as sats are labile with respirations, which are more labored than prior assessment. Lung sounds clear on auscultation, pt denies discomfort though she does appear to have increased WOB. Will continue to closely monitor.

## 2017-01-08 NOTE — Progress Notes (Addendum)
Transfer Summary: Paige Marshall was admitted for decompensated CHF on 3/18. She developed ARF due to cardio-renal syndrome and remained anuric. An HD catheter was inserted 3/21 and CVVHD was initiated. She developed respiratory distress on 3/22 and BiPAP was initiated and transferred to ICU/PCCM. She has required milrinone, amiodarone, cardizem, and heparin gtt's for the past two weeks, with persistent aflutter/RVR despite amio gtt. Failed cardioversion on 3/27. She diuresed approx 110 lbs of fluid. CVVHD was stopped on 3/29 and tunneled IHD cath was placed. Underwent RHC on 3/30 which revealed severe PAH and RV failure and normal left sided pressures. She was transferred back to SDU on 3/30 and we resumed care of this patient.  Subjective: Feeling fine with no complaints this morning. Denies any dyspnea or cough, however bedside monitor is blaring with SpO2 in mid 80s. Appears to be breathing comfortably on 5L [place], improves with nasal respiration. Reports last BM was yesterday.  Planned for HD session today.  Net +1.7 L and gained 2 lbs in 24 hr Telemetry reviewed, persistent irregular Afib/AFL, up to 120s, 2 episodes of NSVT  Objective: Vital signs in last 24 hours: Vitals:   01/08/17 0304 01/08/17 0400 01/08/17 0452 01/08/17 0500  BP: (!) 83/50 (!) 82/55 (!) 88/57 (!) 94/53  Pulse: (!) 116 (!) 110 (!) 112 (!) 112  Resp: 19 19 20  (!) 27  Temp: 98.9 F (37.2 C)     TempSrc: Oral     SpO2: (!) 89% 90% (!) 89% 90%  Weight: (!) 313 lb (142 kg)     Height:        Intake/Output Summary (Last 24 hours) at 01/08/17 0728 Last data filed at 01/08/17 0600  Gross per 24 hour  Intake          1725.63 ml  Output                0 ml  Net          1725.63 ml    Physical Exam General appearance: Morbidly obese woman resting in bed, in no distress, conversational HENT: Normocephalic, atraumatic, moist MM Cardiovascular: Tachycardic, irregular, no audible m/r/g Respiratory/Chest: Clear  anteriorly, normal work of breathing Abdomen: Obese, soft, non-tender, non-distended Skin: Warm, dry, intact Extremities: Obese bulk, 2+ edema to knees bilaterally, tender to palpation Neuro: Alert and oriented, strength grossly intact Psych: Appropriate affect  Labs / Imaging / Procedures: CBC Latest Ref Rng & Units 01/08/2017 01/07/2017 01/06/2017  WBC 4.0 - 10.5 K/uL 9.0 8.2 8.7  Hemoglobin 12.0 - 15.0 g/dL 7.3(L) 7.7(L) 8.4(L)  Hematocrit 36.0 - 46.0 % 24.4(L) 26.2(L) 27.9(L)  Platelets 150 - 400 K/uL 139(L) 130(L) 141(L)   BMP Latest Ref Rng & Units 01/08/2017 01/07/2017 01/06/2017  Glucose 65 - 99 mg/dL 94 95 92  BUN 6 - 20 mg/dL 27(H) 15 12  Creatinine 0.44 - 1.00 mg/dL 4.43(H) 3.11(H) 2.31(H)  Sodium 135 - 145 mmol/L 135 136 136  Potassium 3.5 - 5.1 mmol/L 4.1 4.1 4.8  Chloride 101 - 111 mmol/L 100(L) 99(L) 102  CO2 22 - 32 mmol/L 24 26 25   Calcium 8.9 - 10.3 mg/dL 8.8(L) 9.0 8.4(L)   No results found.  Assessment/Plan: [name] is a 61 y.o. woman with PMH CKD, HFrEF, morbid obesity, COPD/OHS, Afib admitted for AoC combined CHF.   Principal Problem:   Acute on chronic combined systolic and diastolic CHF (congestive heart failure) (HCC) Active Problems:   Essential hypertension   PAF (paroxysmal atrial fibrillation) (  [place])   Obstructive sleep apnea   Morbid obesity ([place])   Obesity hypoventilation syndrome ([place])   COPD mixed type (HCC)   Chronic respiratory failure with hypoxia (HCC)   Acute kidney injury superimposed on CKD ([place])   Hyperkalemia   Thoracic aortic atherosclerosis (HCC)   Cardiorenal syndrome   Acute hypoxemic respiratory failure (HCC)   History of conscious sedation   Acute pulmonary edema (HCC)  Acute on chronic combined systolic and diastolic CHF, diuresed 542 lbs over two weeks via CVVHD, now transitioned to tunneled RIJ cath for HD. HF and renal teams following. Continues to have persistent AFib/AFlutter and RVR despite Dilt and Amio  gtt. RHC yesterday revealed persistent PAH and RV failure despite Milrinone and normalized left side pressures. Planned for AVF and ablation on 4/2. -- Appreciate HF team recs, to attempt weaning Milrinone this weekend -- Continue Milrinone gtt, wean per HF  -- Gained back 7 lbs in last two days, will diurese via HD today -- Strict I/O and daily weights  Atrial fibrillation, CHADSVASC score 5 on Coumadin, persistent atrial fibrillation or flutter during this hospitalization with RVR to 120s+, despite Diltiazem and Amiodarone gtt. Also on Heparin gtt Failed cardioversion on 3/27. -- Continue Heparin, Diltiazem, Amiodaron gtt -- Planned for ablation on 4/2 -- Telemetry  New ESRD, from [place], anuric, ARF secondary to cardiorenal syndrome in setting of decompensated CHF. Has been on CVVHD, transitioned to right IJ tunneled HD cath on 3/29.  -- Planned for HD today  Acute on chronic respiratory failure, with CHF on OSA/OHS, noncompliant with CPAP in past, maintaining adequate SpO2 overall on 5L [place] -- Supplemental O2 as necessary to maintain SpO2 > 92% -- CPAP QHS -- Diuresis per above  Non-sustained VT, intermittent 4-5 beat episodes nightly visible on telemtry -- Trend and maintain Mg > 2 and K >4  Anemia of chronic disease, secondary to ESRD, normocytic anemia to 7-8, ferritin normal. -- Trend CBC -- Ferrous sulfate 325 mg TID -- Aranesp per renal recs  Constipation -- Miralax QD PRN, Dulcolax QD, Sorbitol QD PRN  Chronic pain NOS -- Continue Oxy IR 5 mg Q6H PRN, requiring 1-3 daily  Anxiety NOS -- Continue Xanax 0.25 Q6H PRN  Dispo: Anticipated discharge in approximately 3-4 day(s).   LOS: 13 days   Asencion Partridge, MD 01/08/2017, 7:14 AM Pager: [phone]

## 2017-01-08 NOTE — Progress Notes (Signed)
Pt having freq. runs of SVT ranging from 5 to 10 bts. [facility] cards paged and made aware, pt is asymptomatic at this time. Orders to check BMP and mag and page [facility] if electrolytes need repletion. Will continue to monitor closely.

## 2017-01-08 NOTE — Progress Notes (Signed)
ANTICOAGULATION CONSULT NOTE - Follow Up Consult  Pharmacy Consult for Heparin  Indication: atrial fibrillation  Allergies  Allergen Reactions  . Penicillins Hives, Itching, Swelling and Other (See Comments)    Tongue swelling Has patient had a PCN reaction causing immediate rash, facial/tongue/throat swelling, SOB or lightheadedness with hypotension: Yes  Clarified with the patient her penicillin allergy. Pt has tolerated cefepime in the past. Pt also states that she can take amoxicillin.    . Citrus Hives    Patient Measurements: Height: 5' 7.5" (171.5 cm) Weight: (!) 313 lb (142 kg) IBW/kg (Calculated) : 62.75  Vital Signs: Temp: 98.9 F (37.2 C) (03/31 0304) Temp Source: Oral (03/31 0304) BP: 83/50 (03/31 0304) Pulse Rate: 116 (03/31 0304)  Labs:  Recent Labs  01/06/17 0529 01/06/17 0845 01/06/17 1708 01/07/17 0544 01/07/17 0941 01/08/17 0400  HGB 8.4*  --   --  7.7*  --  7.3*  HCT 27.9*  --   --  26.2*  --  24.4*  PLT 141*  --   --  130*  --  139*  LABPROT  --   --   --   --  18.3*  --   INR  --   --   --   --  1.50  --   HEPARINUNFRC  --  0.49  --  0.25*  --  0.36  CREATININE 1.76*  --  2.31* 3.11*  --   --     Estimated Creatinine Clearance: 29.4 mL/min (A) (by C-G formula based on SCr of 3.11 mg/dL (H)).  Assessment: 60yof on coumadin pta for afib, admitted with volume overload and renal failure. INR 2.84 on admit and coumadin initially resumed (received one dose of 5mg ). On 3/20, coumadin held, and she received 5mg  vitamin k to reverse INR anticipating need for CRRT. Heparin bridge started.  Heparin resumed s/p tunneled HD cath placement 3/29. S/P right heart cath 3/30 and heparin resumed 8 hours post sheath removal. Brachial sheath removed ~ 1100. Heparin level was low 3/30 AM and rate increased to 3400 units/hr   3/31 AM: Therapeutic heparin level x 1 after re-start s/p cath  Goal of Therapy:  Heparin level 0.3-0.7 units/ml Monitor platelets by  anticoagulation protocol: Yes   Plan:  -Cont heparin at 3400 units/hr -1200 HL  Narda Bonds 01/08/2017,4:49 AM

## 2017-01-08 NOTE — Progress Notes (Signed)
[facility] Rounding Note Subjective:    s/p CRRT 3/21-3/29 with weight 414 lb->306  lb at d/c of the CRRT Weight up 7 lb since d/c of CRRT Had R heart cath ->Moderate to severe PAH with RV failure despite milrinone therapy, normal left-sided pressures after 100+ pounds of volume removal with CVVHD No urine output   Objective Vital signs in last 24 hours: Vitals:   01/08/17 0700 01/08/17 0800 01/08/17 0900 01/08/17 1100  BP: (!) 82/45 (!) 82/45 117/84 (!) 160/110  Pulse: (!) 109 (!) 106 (!) 108 (!) 110  Resp: (!) 24 18 17  (!) 24  Temp: 98.9 F (37.2 C)   98.7 F (37.1 C)  TempSrc: Oral   Oral  SpO2: 90% 90% (!) 89% 100%  Weight:      Height:       Weight change: 2.976 kg (6 lb 9 oz)  Intake/Output Summary (Last 24 hours) at 01/08/17 1243 Last data filed at 01/08/17 0749  Gross per 24 hour  Intake          1504.33 ml  Output                0 ml  Net          1504.33 ml    Physical Exam: MO AAF VS as noted New R IJ TDC (3/29) Pleasant, wearing nasal cannula Tachy 120's AF Still w/mild tachypnea Anteriorly clear Pitting edema of abd wall, post thighs.  LE's below the knees all now just trace-1+ and MARKEDLY better  Labs:  Recent Labs Lab 01/06/17 1708 01/07/17 0544 01/08/17 0400  NA 136 136 135  K 4.8 4.1 4.1  CL 102 99* 100*  CO2 25 26 24   GLUCOSE 92 95 94  BUN 12 15 27*  CREATININE 2.31* 3.11* 4.43*  CALCIUM 8.4* 9.0 8.8*  PHOS 2.4* 2.8 2.6     Recent Labs Lab 01/06/17 1708 01/07/17 0544 01/08/17 0400  ALBUMIN 2.7* 2.7* 2.5*       Component Value Date/Time   IRON 64 12/29/2016 0444   TIBC 274 12/29/2016 0444   FERRITIN 115 12/29/2016 0444   IRONPCTSAT 23 12/29/2016 0444     Recent Labs Lab 01/04/17 0510 01/05/17 0800 01/06/17 0529 01/07/17 0544 01/08/17 0400  WBC 8.5 7.9 8.7 8.2 9.0  NEUTROABS  --   --   --  5.9  --   HGB 7.9* 8.0* 8.4* 7.7* 7.3*  HCT 26.4* 27.9* 27.9* 26.2* 24.4*  MCV 89.8 89.1 89.4 89.4 89.4  PLT 110* 127* 141*  130* 139*    Medications: Infusions: . amiodarone 30 mg/hr (01/08/17 1213)  . diltiazem (CARDIZEM) infusion 10 mg/hr (01/08/17 0749)  . heparin 3,400 Units/hr (01/08/17 0749)  . milrinone 0.25 mcg/kg/min (01/08/17 6301)    Scheduled Medications: . bisacodyl  10 mg Oral Daily  . Chlorhexidine Gluconate Cloth  6 each Topical Daily  . darbepoetin (ARANESP) injection - NON-DIALYSIS  150 mcg Subcutaneous Q Tue-1800  . ferrous sulfate  325 mg Oral TID WC  . mouth rinse  15 mL Mouth Rinse BID  . senna-docusate  2 tablet Oral QHS  . sodium chloride flush  10-40 mL Intracatheter Q12H  . sodium chloride flush  3 mL Intravenous Q12H  . sodium chloride flush  3 mL Intravenous Q12H   Background: medical history of morbid obesity / wheelchair bound, untreated OHS, chronic combined systolic / diastolic CHF, NICM, AF on coumadin, HTN, 3L O2 dependent, pulmonary hypertension, former smoker (11 pk years) with  COPD & CKD III. Left fibular fracture late 2017. Admitted from 1/28-11/13/16 for acute CHF exacerbation and presents again 12/27/16 with recurrent congestive heart failure (RV failure)/AF and acute on chronic renal failure. CRRT initiated 3/21 for volume overload. (Baseline creatinine 1.6-1.9 w/intermittent prior episodes AKI on CKD, creatinines as high as 4.4 on prior admissions). Creatinine 3.46 at initiation of CRRT. CRRT 3/21-3/29 with removal 115 lb. [place] placed 3/29.   Assessment/Plan:  1. AKI on CKD 3/4  (creatinine on 2/3 was 1.97) due to cardiorenal syndrome/hemodynamic etiology as UA bland and renal ultrasound normal. Failed milrinone/diuretics. Had CRRT initiated 3/21 through 3/29 with weight down 115 lb. I think she will be dialysis dependent - in that vein TDC placed 3/29.  1st attempt at HD today. Real test will be whether BP will tolerate fluid removal.  VVS consult next week for perm access. 2. Anemia  Transfused 3/24, 3/25. Got Ferahe[place] 3/21 for low tsat . Aranesp 150 QTuesday. Transfuse 1  unit with HD today. 3. CKD-MBD - phos OK. Check PTH 4. A onC dCHF - Had R heart cath 3/30  ->Moderate to severe PAH with RV failure despite milrinone therapy, normal left-sided pressures after 100+ pounds of volume removal with CVVHD 5. AF/RVR ->MAT. Would be nice if could get by without dilt (BP on HD is the issue) 6. OSA/OHV - requiring BIPAP for sleep  Jamal Maes, MD [place] Pager 01/08/2017, 12:43 PM

## 2017-01-08 NOTE — Significant Event (Addendum)
Paged regarding cuff readings in 60s with AF with RVR in 130s.  HR had been relatively stable but SBPs were more typically 80-90. She was a bit sleepier that before per RN. Tele demonstrated AF intermittently conducted with abberancy. Using doppler, it became clear that some of her beats (presumably abberantly conducted beats) did not perfuse well while many beats had a systolic pressure of 42-87G. On my interview, patient was alert and conversant, but mildly confused.  1L had been removed earlier in the day from dialysis.     At this point, we gave 250cc of NS back and 150mg  IV amio bolus (on top of gtt which had been infusing). Cuff pressures improved to upper 70s low 80s. Co-ox was checked and was 47.5. (previous value 51.8, so relatively stable).  ABG (which was reportedly mixed w some venous blood) was 7.44 /41/ 51.7, sat 88.6%.   Subsequently, the patient developed a fever to 102.1.  At this point, given the patients clinical status which now included sepsis with difficult to measure hemodynamics, we elected to transfer the patient to the ICU for closer monitoring with an arterial line.   Plan - transfer to ICU - blood cultures x 2 - CXR - Urine culture - empiric vanc/zosyn - arterial line  - will plan to gently give back some IVF with phenylephrine if needed for BP support  Addendum Patient arrived to ICU with improved mental status and non-invasive BP readings that were extremely variable, most of which did not correlate with the patients palpated pulse or clinical status. For example, at no time did the patient's clinical status correlate with the documented SBPs in the 50s or 60s. Radial arterial lines were attempted bilaterally by respiratory. Arterial access could be obtained but the wire could not be advanced adequately on either side. As such, a RFA arterial line was placed using ultrasound guidance without difficulty. This demonstrated SBPs of nearly 9mmHg. CVP was transduced at  9-10cmH2O.  Will plan for additional 250cc NS with additional phenylephrine to maintain MAPs of 60. Phenyl chosen d/t rapid atrial tachyarrhyhmias and sepsis.  Of note, patient has been afebrile in the ICU and is now in atrial flutter (rather than fib). Will also recheck a co-ox.

## 2017-01-08 NOTE — Progress Notes (Signed)
Advanced Heart Failure Rounding Note  PCP: Kerin Perna, NP  Primary Cardiologist: Dr. Gwenlyn Found   Subjective:    Underwent [place] 3/30. She has moderate PAH with normal PCWP.   Feeling good today. No SOB or orthopnea. Anuric.   Remains on IV amio. Tele shows that AFL broke last night around 8p. Now in AMT.   Objective:   Weight Range: (!) 142 kg (313 lb) Body mass index is 48.3 kg/m.   Vital Signs:   Temp:  [98.3 F (36.8 C)-99.3 F (37.4 C)] 98.7 F (37.1 C) (03/31 1100) Pulse Rate:  [106-129] 110 (03/31 1100) Resp:  [17-31] 24 (03/31 1100) BP: (74-160)/(43-110) 160/110 (03/31 1100) SpO2:  [87 %-100 %] 100 % (03/31 1100) Weight:  [142 kg (313 lb)] 142 kg (313 lb) (03/31 0304) Last BM Date: 01/06/17  Weight change: Filed Weights   01/06/17 1940 01/07/17 0500 01/08/17 0304  Weight: (!) 139 kg (306 lb 7 oz) (!) 141.1 kg (311 lb) (!) 142 kg (313 lb)    Intake/Output:   Intake/Output Summary (Last 24 hours) at 01/08/17 1226 Last data filed at 01/08/17 0749  Gross per 24 hour  Intake          1504.33 ml  Output                0 ml  Net          1504.33 ml     Physical Exam: CVP 6-7 Personally reviewed General: Obese. Lying in bed.   HEENT: Normal  anicteric  Neck: Supple. JVP difficult to assess Carotids 2+ bilat; no bruits LIJ trailysis has been removed Cor: PMI non-palpable. Distant. Irregular tachyNo murmur Lungs: Diminished throughout.  No wheezing Abdomen: Morbidly obese, NT, ND, no HSM. No bruits or masses. +BS  Extremities: no cyanosis, clubbing, rash. 1+ edema.  Neuro: Alert & oriented x 3. Cranial nerves grossly intact. Moves all 4 extremities w/o difficulty. Affect pleasant     Telemetry: Probable MAT 100-110. Personally reviewed    Labs: CBC  Recent Labs  01/07/17 0544 01/08/17 0400  WBC 8.2 9.0  NEUTROABS 5.9  --   HGB 7.7* 7.3*  HCT 26.2* 24.4*  MCV 89.4 89.4  PLT 130* 893*   Basic Metabolic Panel  Recent Labs   01/06/17 0529  01/07/17 0544 01/08/17 0400  NA 134*  < > 136 135  K 5.3*  < > 4.1 4.1  CL 99*  < > 99* 100*  CO2 26  < > 26 24  GLUCOSE 101*  < > 95 94  BUN 10  < > 15 27*  CREATININE 1.76*  < > 3.11* 4.43*  CALCIUM 8.6*  < > 9.0 8.8*  MG 2.5*  --  2.5*  --   PHOS 2.7  < > 2.8 2.6  < > = values in this interval not displayed.  BNP: BNP (last 3 results)  Recent Labs  09/18/16 1126 11/07/16 1633 12/26/16 1643  BNP 991.0* 490.4* 1,116.0*    Medications:     Scheduled Medications: . bisacodyl  10 mg Oral Daily  . Chlorhexidine Gluconate Cloth  6 each Topical Daily  . darbepoetin (ARANESP) injection - NON-DIALYSIS  150 mcg Subcutaneous Q Tue-1800  . ferrous sulfate  325 mg Oral TID WC  . mouth rinse  15 mL Mouth Rinse BID  . senna-docusate  2 tablet Oral QHS  . sodium chloride flush  10-40 mL Intracatheter Q12H  . sodium chloride flush  3  mL Intravenous Q12H  . sodium chloride flush  3 mL Intravenous Q12H    Infusions: . amiodarone 30 mg/hr (01/08/17 1213)  . diltiazem (CARDIZEM) infusion 10 mg/hr (01/08/17 0749)  . heparin 3,400 Units/hr (01/08/17 0749)  . milrinone 0.25 mcg/kg/min (01/08/17 0646)    PRN Medications: sodium chloride, sodium chloride, acetaminophen, ALPRAZolam, ondansetron (ZOFRAN) IV, oxyCODONE, polyethylene glycol, promethazine, sodium chloride, sodium chloride flush, sodium chloride flush, sodium chloride flush, sorbitol   Assessment/Plan   1. Acute on chronic diastolic CHF 2. Paroxysmal atrial fibrillation on Coumadin    -s/p DC-CV on 3/27 3. Acute on chronic kidney disease stage IV 4. Obesity hypoventilation syndrome.  5. Hyperkalemia 6. PAH with acute RV failure/cor pulmonale 7. OSA, noncompliant with CPAP 8. Acute on chronic respiratory failure 9. Anemia 10. NSVT   Down over 100 pounds with CVVH.  Trialysis cath has been removed and tunneled cath placed for iHD.  RHC results reviewed with her.   Will start to wean milrinone.  Turn to 0.125. Follow co-ox.   AFL converted to apparent MAT. Will get ECG to confirm. Continue amio IV today. Switch to po tomorrow. Can change diltiazem to po.     Remains anuric. Renal preparing her for long-term HD.   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 Not candidate for pulmonary vasodilators at this point.   Occasional NSVT but now less frequent. Watch electrolytes.   Length of Stay: 13  Glori Bickers, MD  01/08/2017, 12:26 PM  Advanced Heart Failure Team  Pager [phone] M-F 7am-4pm.  Please contact [facility] for night-coverage after hours (4p -7a ) and weekends on [email or web address]

## 2017-01-09 ENCOUNTER — Inpatient Hospital Stay (HOSPITAL_COMMUNITY): Payer: Medicare Other

## 2017-01-09 DIAGNOSIS — R6521 Severe sepsis with septic shock: Secondary | ICD-10-CM

## 2017-01-09 DIAGNOSIS — R7881 Bacteremia: Secondary | ICD-10-CM | POA: Diagnosis present

## 2017-01-09 DIAGNOSIS — I959 Hypotension, unspecified: Secondary | ICD-10-CM

## 2017-01-09 DIAGNOSIS — A419 Sepsis, unspecified organism: Secondary | ICD-10-CM

## 2017-01-09 DIAGNOSIS — Z1621 Resistance to vancomycin: Secondary | ICD-10-CM

## 2017-01-09 LAB — RENAL FUNCTION PANEL
ALBUMIN: 2.7 g/dL — AB (ref 3.5–5.0)
ANION GAP: 9 (ref 5–15)
Albumin: 2.6 g/dL — ABNORMAL LOW (ref 3.5–5.0)
Anion gap: 11 (ref 5–15)
BUN: 12 mg/dL (ref 6–20)
BUN: 15 mg/dL (ref 6–20)
CALCIUM: 8.6 mg/dL — AB (ref 8.9–10.3)
CHLORIDE: 95 mmol/L — AB (ref 101–111)
CO2: 27 mmol/L (ref 22–32)
CO2: 25 mmol/L (ref 22–32)
Calcium: 8.4 mg/dL — ABNORMAL LOW (ref 8.9–10.3)
Chloride: 100 mmol/L — ABNORMAL LOW (ref 101–111)
Creatinine, Ser: 2.95 mg/dL — ABNORMAL HIGH (ref 0.44–1.00)
Creatinine, Ser: 3.05 mg/dL — ABNORMAL HIGH (ref 0.44–1.00)
GFR calc Af Amer: 18 mL/min — ABNORMAL LOW (ref >60)
GFR calc non Af Amer: 16 mL/min — ABNORMAL LOW (ref >60)
GFR, EST AFRICAN AMERICAN: 19 mL/min — AB (ref >60)
GFR, EST NON AFRICAN AMERICAN: 16 mL/min — AB (ref >60)
GLUCOSE: 128 mg/dL — AB (ref 65–99)
Glucose, Bld: 89 mg/dL (ref 65–99)
PHOSPHORUS: 2 mg/dL — AB (ref 2.5–4.6)
POTASSIUM: 3.2 mmol/L — AB (ref 3.5–5.1)
Phosphorus: 1.9 mg/dL — ABNORMAL LOW (ref 2.5–4.6)
Potassium: 3.7 mmol/L (ref 3.5–5.1)
SODIUM: 134 mmol/L — AB (ref 135–145)
Sodium: 133 mmol/L — ABNORMAL LOW (ref 135–145)

## 2017-01-09 LAB — CORTISOL: Cortisol, Plasma: 14.5 ug/dL

## 2017-01-09 LAB — BLOOD CULTURE ID PANEL (REFLEXED)
Acinetobacter baumannii: NOT DETECTED
CANDIDA ALBICANS: NOT DETECTED
CANDIDA GLABRATA: NOT DETECTED
CANDIDA KRUSEI: NOT DETECTED
CANDIDA TROPICALIS: NOT DETECTED
Candida parapsilosis: NOT DETECTED
ENTEROBACTER CLOACAE COMPLEX: NOT DETECTED
ESCHERICHIA COLI: NOT DETECTED
Enterobacteriaceae species: NOT DETECTED
Enterococcus species: DETECTED — AB
Haemophilus influenzae: NOT DETECTED
KLEBSIELLA PNEUMONIAE: NOT DETECTED
Klebsiella oxytoca: NOT DETECTED
Listeria monocytogenes: NOT DETECTED
Neisseria meningitidis: NOT DETECTED
Proteus species: NOT DETECTED
Pseudomonas aeruginosa: NOT DETECTED
STREPTOCOCCUS AGALACTIAE: NOT DETECTED
STREPTOCOCCUS PYOGENES: NOT DETECTED
Serratia marcescens: NOT DETECTED
Staphylococcus aureus (BCID): NOT DETECTED
Staphylococcus species: NOT DETECTED
Streptococcus pneumoniae: NOT DETECTED
Streptococcus species: NOT DETECTED
Vancomycin resistance: DETECTED — AB

## 2017-01-09 LAB — CBC
HEMATOCRIT: 23.9 % — AB (ref 36.0–46.0)
Hemoglobin: 7.1 g/dL — ABNORMAL LOW (ref 12.0–15.0)
MCH: 26.4 pg (ref 26.0–34.0)
MCHC: 29.7 g/dL — ABNORMAL LOW (ref 30.0–36.0)
MCV: 88.8 fL (ref 78.0–100.0)
Platelets: 146 10*3/uL — ABNORMAL LOW (ref 150–400)
RBC: 2.69 MIL/uL — AB (ref 3.87–5.11)
RDW: 26.4 % — AB (ref 11.5–15.5)
WBC: 7.3 10*3/uL (ref 4.0–10.5)

## 2017-01-09 LAB — COOXEMETRY PANEL
CARBOXYHEMOGLOBIN: 2.6 % — AB (ref 0.5–1.5)
Carboxyhemoglobin: 2.4 % — ABNORMAL HIGH (ref 0.5–1.5)
Methemoglobin: 1 % (ref 0.0–1.5)
Methemoglobin: 0.9 % (ref 0.0–1.5)
O2 SAT: 83.3 %
O2 Saturation: 70 %
TOTAL HEMOGLOBIN: 6.2 g/dL — AB (ref 12.0–16.0)
TOTAL HEMOGLOBIN: 7.4 g/dL — AB (ref 12.0–16.0)

## 2017-01-09 LAB — PREPARE RBC (CROSSMATCH)

## 2017-01-09 LAB — HEPARIN LEVEL (UNFRACTIONATED): Heparin Unfractionated: 0.4 IU/mL (ref 0.30–0.70)

## 2017-01-09 LAB — HEPATITIS B CORE ANTIBODY, TOTAL: Hep B Core Total Ab: NEGATIVE

## 2017-01-09 LAB — HEPATITIS B SURFACE ANTIGEN: Hepatitis B Surface Ag: NEGATIVE

## 2017-01-09 LAB — HEPATITIS B E ANTIGEN: Hep B E Ag: NEGATIVE

## 2017-01-09 MED ORDER — HEPARIN (PORCINE) 2000 UNITS/L FOR CRRT
INTRAVENOUS_CENTRAL | Status: DC | PRN
  Administered 2017-01-14: 16:00:00 via INTRAVENOUS_CENTRAL
  Filled 2017-01-09 (×4): qty 1000

## 2017-01-09 MED ORDER — PRISMASOL BGK 4/2.5 32-4-2.5 MEQ/L IV SOLN
INTRAVENOUS | Status: DC
  Administered 2017-01-09 – 2017-01-16 (×13): via INTRAVENOUS_CENTRAL
  Filled 2017-01-09 (×14): qty 5000

## 2017-01-09 MED ORDER — HEPARIN BOLUS VIA INFUSION (CRRT)
1000.0000 [IU] | INTRAVENOUS | Status: DC | PRN
  Filled 2017-01-09: qty 1000

## 2017-01-09 MED ORDER — DEXTROSE 5 % IV SOLN
2.0000 g | Freq: Two times a day (BID) | INTRAVENOUS | Status: DC
  Filled 2017-01-09: qty 2

## 2017-01-09 MED ORDER — SODIUM CHLORIDE 0.9 % IV SOLN
0.0000 ug/min | INTRAVENOUS | Status: DC
  Administered 2017-01-09: 20 ug/min via INTRAVENOUS
  Administered 2017-01-09: 150 ug/min via INTRAVENOUS
  Filled 2017-01-09 (×3): qty 1

## 2017-01-09 MED ORDER — SODIUM CHLORIDE 0.9 % IV SOLN: INTRAVENOUS | Status: DC | PRN

## 2017-01-09 MED ORDER — SODIUM CHLORIDE 0.9 % IV BOLUS (SEPSIS)
250.0000 mL | Freq: Once | INTRAVENOUS | Status: AC
  Administered 2017-01-09: 250 mL via INTRAVENOUS

## 2017-01-09 MED ORDER — SODIUM CHLORIDE 0.9 % IV SOLN
Freq: Once | INTRAVENOUS | Status: AC
  Administered 2017-01-09: 11:00:00 via INTRAVENOUS

## 2017-01-09 MED ORDER — PRISMASOL BGK 4/2.5 32-4-2.5 MEQ/L IV SOLN
INTRAVENOUS | Status: DC
  Administered 2017-01-09 – 2017-01-17 (×53): via INTRAVENOUS_CENTRAL
  Filled 2017-01-09 (×69): qty 5000

## 2017-01-09 MED ORDER — POTASSIUM CHLORIDE 20 MEQ/15ML (10%) PO SOLN
30.0000 meq | Freq: Every day | ORAL | Status: DC
  Administered 2017-01-09 – 2017-01-12 (×3): 30 meq via ORAL
  Filled 2017-01-09 (×3): qty 30

## 2017-01-09 MED ORDER — DEXTROSE 5 % IV SOLN
2.0000 g | Freq: Once | INTRAVENOUS | Status: AC
  Administered 2017-01-09: 2 g via INTRAVENOUS
  Filled 2017-01-09 (×2): qty 2

## 2017-01-09 MED ORDER — SODIUM CHLORIDE 0.9 % IV SOLN
1000.0000 mg | Freq: Once | INTRAVENOUS | Status: AC
  Administered 2017-01-09: 1000 mg via INTRAVENOUS
  Filled 2017-01-09: qty 20

## 2017-01-09 MED ORDER — SODIUM CHLORIDE 0.9 % IV SOLN
0.0000 ug/min | INTRAVENOUS | Status: DC
  Administered 2017-01-09: 150 ug/min via INTRAVENOUS
  Filled 2017-01-09 (×2): qty 4

## 2017-01-09 MED ORDER — HEPARIN SODIUM (PORCINE) 1000 UNIT/ML DIALYSIS
1000.0000 [IU] | INTRAMUSCULAR | Status: DC | PRN
  Administered 2017-01-17: 3200 [IU] via INTRAVENOUS_CENTRAL
  Filled 2017-01-09: qty 6
  Filled 2017-01-09: qty 4
  Filled 2017-01-09: qty 6

## 2017-01-09 MED ORDER — HEPARIN SODIUM (PORCINE) 5000 UNIT/ML IJ SOLN
250.0000 [IU]/h | INTRAMUSCULAR | Status: DC
  Filled 2017-01-09: qty 2

## 2017-01-09 MED ORDER — DEXTROSE 5 % IV SOLN: 1.0000 g | INTRAVENOUS | Status: DC

## 2017-01-09 MED ORDER — NOREPINEPHRINE BITARTRATE 1 MG/ML IV SOLN
0.0000 ug/min | INTRAVENOUS | Status: DC
  Filled 2017-01-09: qty 4

## 2017-01-09 MED ORDER — DEXTROSE 5 % IV SOLN: 2.0000 g | INTRAVENOUS | Status: DC

## 2017-01-09 MED ORDER — PRISMASOL BGK 4/2.5 32-4-2.5 MEQ/L IV SOLN
INTRAVENOUS | Status: DC
  Administered 2017-01-09 – 2017-01-16 (×14): via INTRAVENOUS_CENTRAL
  Filled 2017-01-09 (×15): qty 5000

## 2017-01-09 MED ORDER — NOREPINEPHRINE BITARTRATE 1 MG/ML IV SOLN
3.0000 ug/min | INTRAVENOUS | Status: DC
  Administered 2017-01-09: 5 ug/min via INTRAVENOUS
  Administered 2017-01-10: 16 ug/min via INTRAVENOUS
  Administered 2017-01-12: 3 ug/min via INTRAVENOUS
  Filled 2017-01-09 (×4): qty 16

## 2017-01-09 MED ORDER — SODIUM CHLORIDE 0.9 % IV SOLN
2000.0000 mg | Freq: Once | INTRAVENOUS | Status: AC
  Administered 2017-01-09: 2000 mg via INTRAVENOUS
  Filled 2017-01-09 (×2): qty 2000

## 2017-01-09 NOTE — Progress Notes (Signed)
   01/09/17 0130 01/09/17 0145  Vitals  BP (!) 60/51 (!) 65/43  MAP (mmHg) (!) 55 (!) 52  Pulse Rate 86 (!) 113  ECG Heart Rate (!) 121 (!) 116  Resp (!) 21 20  Oxygen Therapy  SpO2 97 % 100 %  patient awake, alert and oriented. Skin warm and dry. Significant other at bedside. Patient not demonstrating signs of a BP this low. Respiratory to attempt [place]. Will monitor.

## 2017-01-09 NOTE — Progress Notes (Signed)
RT set up CPAP and placed on patient. Patient tolerating well at this time with no respiratory distress noted. RT will monitor as needed.

## 2017-01-09 NOTE — Progress Notes (Signed)
Pharmacy Antibiotic Note  Paige Marshall is a 61 y.o. female admitted on 12/26/2016 with sepsis.  Pharmacy has been consulted for vancomycin and cefepime dosing. Patient received 2g vancomycin + 2g cefepime around midnight 3/31. Spoke to RN who reported that patient will be started back on CRRT today (~1200 on 4/1). Will follow up with AM vancomycin random to schedule doses either while on CRRT or with future HD. Will schedule cefepime ~12 hours after initiation of CRRT.  Plan: Vancomycin random with AM labs Cefepime 2g IV every 12 hours Follow renal function/plans, culture results, and LOT  Height: 5' 7.5" (171.5 cm) Weight: (!) 317 lb (143.8 kg) IBW/kg (Calculated) : 62.75  Temp (24hrs), Avg:99.4 F (37.4 C), Min:98.4 F (36.9 C), Max:102.1 F (38.9 C)   Recent Labs Lab 01/05/17 0800  01/06/17 0529 01/06/17 1708 01/07/17 0544 01/08/17 0400 01/08/17 2142 01/09/17 0500  WBC 7.9  --  8.7  --  8.2 9.0  --  7.3  CREATININE  --   < > 1.76* 2.31* 3.11* 4.43* 2.58* 2.95*  < > = values in this interval not displayed.  Estimated Creatinine Clearance: 31.2 mL/min (A) (by C-G formula based on SCr of 2.95 mg/dL (H)).    Allergies  Allergen Reactions  . Penicillins Hives, Itching, Swelling and Other (See Comments)    Tongue swelling Has patient had a PCN reaction causing immediate rash, facial/tongue/throat swelling, SOB or lightheadedness with hypotension: Yes  Clarified with the patient her penicillin allergy. Pt has tolerated cefepime in the past. Pt also states that she can take amoxicillin.    . Citrus Hives    Antimicrobials this admission: 3/31 vanc >>  3/31 cefepime >>   Dose adjustments this admission: NA  Microbiology results: 4/1 BCx:   Thank you for allowing pharmacy to be a part of this patient's care.  Melburn Popper, PharmD Clinical Pharmacy Resident Pager: [phone] 01/09/17 11:47 AM

## 2017-01-09 NOTE — Progress Notes (Signed)
Advanced Heart Failure Rounding Note  PCP: EDWARDS, MICHELLE P, NP  Primary Cardiologist: Dr. Gwenlyn Found   Subjective:    Last night developed recurrent AF with RVR followed by temp spike to 102. SBP dropped in 70-80 range (was hard to assess non-invasively so arterial line placed). Blood cultures done/abx started (Vanco/zosyn). Started on neo. Now on neo at 150. SBP now in 90-100 range  Getting 1u PRBcs. CVP 17. About to restart CVVHD  Denies SOB or CP. No palpitations.[name] Co-ox 70%  WBC 7.3 hgb 7.1     Objective:   Weight Range: (!) 143.8 kg (317 lb) Body mass index is 48.92 kg/m.   Vital Signs:   Temp:  [98.6 F (37 C)-102.1 F (38.9 C)] 98.6 F (37 C) (04/01 0128) Pulse Rate:  [79-133] 122 (04/01 0800) Resp:  [14-28] 26 (04/01 0800) BP: (52-160)/(33-110) 98/68 (04/01 0800) SpO2:  [4 %-100 %] 92 % (04/01 0800) Arterial Line BP: (67-122)/(35-66) 122/66 (04/01 0800) Weight:  [142 kg (313 lb 0.9 oz)-143.8 kg (317 lb)] 143.8 kg (317 lb) (04/01 0500) Last BM Date: 01/06/17  Weight change: Filed Weights   01/08/17 1319 01/08/17 1720 01/09/17 0500  Weight: (!) 143 kg (315 lb 4.1 oz) (!) 142 kg (313 lb 0.9 oz) (!) 143.8 kg (317 lb)    Intake/Output:   Intake/Output Summary (Last 24 hours) at 01/09/17 1010 Last data filed at 01/09/17 0800  Gross per 24 hour  Intake          3739.75 ml  Output             1000 ml  Net          2739.75 ml     Physical Exam: CVP 17 Personally reviewed. On facemask but comfortable  General: Obese. Lying in bed  HEENT: Normal  anicteric  Neck: Supple. JVP difficult to assess  Carotids 2+ bilat; no bruits LIJ trailysis has been removed Cor: PMI non-palpable. Distant irreg tachy Lungs: Diminished throughout.  No rhonchi or wheezing Abdomen: Morbidly obese, NT, ND, no HSM. No bruits or masses. +BS  Extremities: no cyanosis, clubbing, rash. Warm 1+ edema  Neuro: Alert & oriented x 3. Cranial nerves grossly intact. Moves all 4 extremities  w/o difficulty. Affect pleasant     Telemetry: AFL/AFib 100-110. Initial rhythm last night seems like AF in 120-130 range. Now more like AFL 105-110 Personally reviewed       Labs: CBC  Recent Labs  01/07/17 0544 01/08/17 0400 01/09/17 0500  WBC 8.2 9.0 7.3  NEUTROABS 5.9  --   --   HGB 7.7* 7.3* 7.1*  HCT 26.2* 24.4* 23.9*  MCV 89.4 89.4 88.8  PLT 130* 139* 093*   Basic Metabolic Panel  Recent Labs  01/07/17 0544 01/08/17 0400 01/08/17 2142 01/09/17 0500  NA 136 135 132* 133*  K 4.1 4.1 3.2* 3.2*  CL 99* 100* 96* 95*  CO2 26 24 27 27   GLUCOSE 95 94 99 89  BUN 15 27* 10 12  CREATININE 3.11* 4.43* 2.58* 2.95*  CALCIUM 9.0 8.8* 8.3* 8.4*  MG 2.5*  --  1.9  --   PHOS 2.8 2.6  --  1.9*    BNP: BNP (last 3 results)  Recent Labs  09/18/16 1126 11/07/16 1633 12/26/16 1643  BNP 991.0* 490.4* 1,116.0*    Medications:     Scheduled Medications: . sodium chloride   Intravenous Once  . bisacodyl  10 mg Oral Daily  . ceFEPime (MAXIPIME) IV  1 g Intravenous Q24H  . Chlorhexidine Gluconate Cloth  6 each Topical Daily  . darbepoetin (ARANESP) injection - NON-DIALYSIS  150 mcg Subcutaneous Q Tue-1800  . diltiazem  30 mg Oral Q6H  . ferrous sulfate  325 mg Oral TID WC  . mouth rinse  15 mL Mouth Rinse BID  . potassium chloride  30 mEq Oral Daily  . senna-docusate  2 tablet Oral QHS    Infusions: . amiodarone 30 mg/hr (01/09/17 0700)  . heparin 3,400 Units/hr (01/09/17 0700)  . milrinone 0.125 mcg/kg/min (01/09/17 0700)  . phenylephrine (NEO-SYNEPHRINE) Adult infusion 150 mcg/min (01/09/17 0855)  . dialysis replacement fluid (prismasate)    . dialysis replacement fluid (prismasate)    . dialysate (PRISMASATE)      PRN Medications: Place/Maintain arterial line **AND** sodium chloride, acetaminophen, ALPRAZolam, heparin, heparin, ondansetron (ZOFRAN) IV, oxyCODONE, polyethylene glycol, promethazine, sodium chloride, sorbitol   Assessment/Plan   1. Acute  on chronic diastolic CHF 2. Paroxysmal atrial fibrillation on Coumadin    -s/p DC-CV on 3/27 3. Acute on chronic kidney disease stage IV 4. Obesity hypoventilation syndrome.  5. Hyperkalemia 6. PAH with acute RV failure/cor pulmonale 7. OSA, noncompliant with CPAP 8. Acute on chronic respiratory failure 9. Anemia 10. NSVT 11. Sepsis  Likely developed sepsis last night with associated hypotension complicated by recurrent AF with RVR. BP improved on neosynephrine. Co-ox ok. Will continue abx. Stop milrinone. Await culture data. If bcx positive will need lines out/changed. Tunneled HD cath is only 63 hours old.  Will continue amiodarone for AF/AFL. Was tentatively planned for AFL ablation tomorrow but with recurrence of Afib may not be as beneficial. Will d/w EP in am.   Renal resuming CRRT.   I have talked with IMTS. Patient now back in ICU. Will need CCM to resume care with sepsis, hypotension and renal failure. We will continue to manage rhythm issues and RV failure.   The patient is critically ill with multiple organ systems failure and requires high complexity decision making for assessment and support, frequent evaluation and titration of therapies, application of advanced monitoring technologies and extensive interpretation of multiple databases.   Critical Care Time devoted to patient care services described in this note is 35 Minutes. This time reflects time of care of this signee, Dr. Pierre Bali who personally examined the patient and determined the plan of care. This critical care time does not reflect procedure time, teaching time or supervisory time of our PA/NP/resident but could involve care discussion and coordination time.   Length of Stay: 14  Glori Bickers, MD  01/09/2017, 10:10 AM  Advanced Heart Failure Team  Pager [phone] M-F 7am-4pm.  Please contact [facility] for night-coverage after hours (4p -7a ) and weekends on [email or web address]

## 2017-01-09 NOTE — Progress Notes (Addendum)
Transferred to [place], report to [facility]. All belongings packed and transported with patient. Voicemail left on husbands work phone, call back number left.   1:22 AM: husband reached and update provided per pt request, new room # given to husband who states he will come see her.

## 2017-01-09 NOTE — Progress Notes (Signed)
PHARMACY - PHYSICIAN COMMUNICATION CRITICAL VALUE ALERT - BLOOD CULTURE IDENTIFICATION (BCID)  Results for orders placed or performed during the hospital encounter of 12/26/16  Blood Culture ID Panel (Reflexed) (Collected: 01/09/2017 12:32 AM)  Result Value Ref Range   Enterococcus species DETECTED (A) NOT DETECTED   Vancomycin resistance DETECTED (A) NOT DETECTED   Listeria monocytogenes NOT DETECTED NOT DETECTED   Staphylococcus species NOT DETECTED NOT DETECTED   Staphylococcus aureus NOT DETECTED NOT DETECTED   Streptococcus species NOT DETECTED NOT DETECTED   Streptococcus agalactiae NOT DETECTED NOT DETECTED   Streptococcus pneumoniae NOT DETECTED NOT DETECTED   Streptococcus pyogenes NOT DETECTED NOT DETECTED   Acinetobacter baumannii NOT DETECTED NOT DETECTED   Enterobacteriaceae species NOT DETECTED NOT DETECTED   Enterobacter cloacae complex NOT DETECTED NOT DETECTED   Escherichia coli NOT DETECTED NOT DETECTED   Klebsiella oxytoca NOT DETECTED NOT DETECTED   Klebsiella pneumoniae NOT DETECTED NOT DETECTED   Proteus species NOT DETECTED NOT DETECTED   Serratia marcescens NOT DETECTED NOT DETECTED   Haemophilus influenzae NOT DETECTED NOT DETECTED   Neisseria meningitidis NOT DETECTED NOT DETECTED   Pseudomonas aeruginosa NOT DETECTED NOT DETECTED   Candida albicans NOT DETECTED NOT DETECTED   Candida glabrata NOT DETECTED NOT DETECTED   Candida krusei NOT DETECTED NOT DETECTED   Candida parapsilosis NOT DETECTED NOT DETECTED   Candida tropicalis NOT DETECTED NOT DETECTED    Name of physician (or Provider) Contacted: Dr. Oletta Darter  Changes to prescribed antibiotics required: Discontinue vancomycin and give Daptomycin x 1 dose. ID consult in am.   Vincenza Hews, PharmD, BCPS 01/09/2017, 6:58 PM

## 2017-01-09 NOTE — Progress Notes (Signed)
RT notified RN, Colletta Maryland that PT CVP line is prepped, in PT room, and ready for PT application.

## 2017-01-09 NOTE — Progress Notes (Signed)
[facility] Rounding Note Subjective:    s/p CRRT 3/21-3/29 with weight 414 lb->306 lb at d/c of the CRRT Tolerated hemo 3/31  1 liter off and no issues during treatment  Milrinone dose was reduced in half 3/31  Later in the evening 3/31 - AF with RVR, drop in co-ox, then temp spike to 102. Blood cultures done/atbs started (Vanco/zosyn) Now on neo for BP support  Obligatory fluid intake with pressors/inotropes/ATB's 80-100 cc/hour  Weight is up from 306->317 since CRRT off   Objective Vital signs in last 24 hours: Vitals:   01/09/17 0645 01/09/17 0650 01/09/17 0700 01/09/17 0715  BP:   (!) 94/56   Pulse: 94 99 (!) 106 98  Resp: 14 14 15 15   Temp:      TempSrc:      SpO2: 93% 98% 90% 97%  Weight:      Height:       Weight change: 1.024 kg (2 lb 4.1 oz)  Intake/Output Summary (Last 24 hours) at 01/09/17 0818 Last data filed at 01/09/17 0700  Gross per 24 hour  Intake          3432.05 ml  Output             1000 ml  Net          2432.05 ml    Physical Exam: MO AAF VS as noted Tmax 102 CVP 13-14 currently  R IJ TDC (3/29) R fem a-line (4/1) Tearful, crying, discouraged AFF RVR Anteriorly clear Pitting edema of abd wall, post thighs stable.  LE's below the knees all now just trace-1+  Labs:  Recent Labs Lab 01/07/17 0544 01/08/17 0400 01/08/17 2142 01/09/17 0500  NA 136 135 132* 133*  K 4.1 4.1 3.2* 3.2*  CL 99* 100* 96* 95*  CO2 26 24 27 27   GLUCOSE 95 94 99 89  BUN 15 27* 10 12  CREATININE 3.11* 4.43* 2.58* 2.95*  CALCIUM 9.0 8.8* 8.3* 8.4*  PHOS 2.8 2.6  --  1.9*     Recent Labs Lab 01/08/17 0400 01/08/17 2142 01/09/17 0500  AST  --  22  --   ALT  --  17  --   ALKPHOS  --  64  --   BILITOT  --  2.0*  --   PROT  --  5.8*  --   ALBUMIN 2.5* 2.7* 2.6*       Component Value Date/Time   IRON 64 12/29/2016 0444   TIBC 274 12/29/2016 0444   FERRITIN 115 12/29/2016 0444   IRONPCTSAT 23 12/29/2016 0444     Recent Labs Lab 01/05/17 0800  01/06/17 0529 01/07/17 0544 01/08/17 0400 01/09/17 0500  WBC 7.9 8.7 8.2 9.0 7.3  NEUTROABS  --   --  5.9  --   --   HGB 8.0* 8.4* 7.7* 7.3* 7.1*  HCT 27.9* 27.9* 26.2* 24.4* 23.9*  MCV 89.1 89.4 89.4 89.4 88.8  PLT 127* 141* 130* 139* 146*    Medications: Infusions: . amiodarone 30 mg/hr (01/09/17 0512)  . heparin 3,400 Units/hr (01/09/17 0542)  . milrinone 0.125 mcg/kg/min (01/09/17 0000)  . phenylephrine (NEO-SYNEPHRINE) Adult infusion 150 mcg/min (01/09/17 0747)    Scheduled Medications: . sodium chloride   Intravenous Once  . bisacodyl  10 mg Oral Daily  . ceFEPime (MAXIPIME) IV  1 g Intravenous Q24H  . Chlorhexidine Gluconate Cloth  6 each Topical Daily  . darbepoetin (ARANESP) injection - NON-DIALYSIS  150 mcg Subcutaneous Q Tue-1800  .  diltiazem  30 mg Oral Q6H  . ferrous sulfate  325 mg Oral TID WC  . mouth rinse  15 mL Mouth Rinse BID  . potassium chloride  30 mEq Oral Daily  . senna-docusate  2 tablet Oral QHS  . sodium chloride flush  10-40 mL Intracatheter Q12H  . sodium chloride flush  3 mL Intravenous Q12H  . sodium chloride flush  3 mL Intravenous Q12H   Background: PMH morbid obesity / wheelchair bound, OHS, chronic combined systolic / diastolic CHF, NICM, AF on coumadin, HTN, 3L O2 dependent, pulmonary hypertension, former smoker (11 pk years) with COPD & baseline CKD III. Left fibular fracture late 2017. Admitted from 1/28-11/13/16 for acute CHF exacerbation and presented again 12/27/16 with recurrent CHF (RV failure)/AF, AKI on CKD. CRRT initiated 3/21 for volume overload. (Baseline creatinine 1.6-1.9 w/intermittent prior episodes AKI on CKD, creatinines as high as 4.4 on prior admissions). Creatinine 3.46 at initiation of CRRT. CRRT 3/21-3/29 with removal 115 lb. Diamond placed 3/29. CRRT resumed 4/1 after acute decomp in BP requiring pressor support  Assessment/Plan:  1. AKI on CKD 3/4  (creatinine on 2/3 was 1.97) 2/2 cardiorenal syndrome/hemodynamic etiology  (UA bland and renal ultrasound normal). Failed milrinone/diuretics. Had CRRT initiated 3/21 through 3/29 with weight down 115 lb. Tolerated hemo yesterday, then decompenstaion during the night with rhythm issues, fever spike (ATB's started). Obligatory fluid intake now 80-100 cc/hour so will require resumption of CRRT. Weight up from 306->317 since CRRT stopped. 2. Anemia  Transfused 3/24, 3/25, 3/31. s/p  Feraheme 3/21 for low tsat . Aranesp 150 QTuesday. 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 Phos low and will repeat at 4 PM, replace if needed 4. A onC dCHF - R heart cath 3/30 -->Moderate to severe PAH with RV failure despite milrinone, normal left-sided pressures after 100+ pounds of volume removal with CVVHD (at a weight of 306). Cards reduced milronone by 1/2 yesterday - ? If will need to go back up 5. AF/RVR ->MAT->AFF again. Would be nice if could get by without dilt (BP on HD is the issue) 6. OSA/OHV - requiring BIPAP for sleep 7. Fever - temp spike to 102. BC's pending. Vanco/cefipime started 8. Hypotension - filling pressures OK by CVP's, requiring neo, had fever now requiring ATB's.   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 She tolerated a single HD (no problems during) but then acute decompensation again in setting of fever/recurrent AF/RVR, now again needing pressors and inotropes. Question is becoming whether or not she will ever be able to get off pressors/inotropic support such that long term HD could be tolerated. She personally is not at a point where she wants to stop, but there may be a time where we have to tell her that continuing is not technically possible.    Renaee Munda, MD [facility] [phone] Pager 01/09/2017, 8:18 AM

## 2017-01-09 NOTE — Progress Notes (Addendum)
Pharmacy Antibiotic Note  Paige Marshall is a 61 y.o. female  with hypotension/fevers, possible sepsis.  Pharmacy has been consulted for Vancomycin and Cefepime  dosing.  Plan: Vancomycin 2 g IV now.   Cefepime 2 g IV now, then 1 g IV q24h F/U HD plan and adjust/redose as necessary   Height: 5' 7.5" (171.5 cm) Weight: (!) 313 lb 0.9 oz (142 kg) IBW/kg (Calculated) : 62.75  Temp (24hrs), Avg:99.2 F (37.3 C), Min:98.6 F (37 C), Max:102.1 F (38.9 C)   Recent Labs Lab 01/04/17 0510  01/05/17 0800  01/06/17 0529 01/06/17 1708 01/07/17 0544 01/08/17 0400 01/08/17 2142  WBC 8.5  --  7.9  --  8.7  --  8.2 9.0  --   CREATININE 1.77*  < >  --   < > 1.76* 2.31* 3.11* 4.43* 2.58*  < > = values in this interval not displayed.  Estimated Creatinine Clearance: 35.5 mL/min (A) (by C-G formula based on SCr of 2.58 mg/dL (H)).    Allergies  Allergen Reactions  . Penicillins Hives, Itching, Swelling and Other (See Comments)    Tongue swelling Has patient had a PCN reaction causing immediate rash, facial/tongue/throat swelling, SOB or lightheadedness with hypotension: Yes  Clarified with the patient her penicillin allergy. Pt has tolerated cefepime in the past. Pt also states that she can take amoxicillin.    . Citrus Hives    Caryl Pina 01/09/2017 12:13 AM

## 2017-01-09 NOTE — Progress Notes (Signed)
PULMONARY / CRITICAL CARE MEDICINE   Name: Paige Marshall MRN: [id number] DOB: April 10, 1956    ADMISSION DATE:  12/26/2016 CONSULTATION DATE:  12/30/16  REFERRING MD:  Dr. Tommy Medal   CHIEF COMPLAINT:  Shortness of breath   HISTORY OF PRESENT ILLNESS:  61 y/o F admitted from rehab facility (resident since November 2017) on 3/18 with complaints of shortness of breath & lower extremity swelling.    The patient has a medical history of morbid obesity / wheelchair bound, untreated OHS, chronic combined systolic / diastolic CHF, NICM, AF on coumadin, HTN, 3L O2 dependent, pulmonary hypertension, former smoker (11 pk years) with COPD & CKD III.  The patient suffered a left fibular fracture in late 2017.  She was recently admitted from 1/28-11/13/16 for acute CHF exacerbation.  At that time, her discharge weight was 370lbs.    She returned from the rehab facility on 3/18 with increased SOB and lower extremity swelling.  She if followed by Dr. Gwenlyn Found for cardiology and was recently switched to lasix and metolazone.  Lasix was discontinued at the SNF due to rising sr cr.  Her admit weight was 411lbs.  She was admitted by IMTS for decompensated CHF.  She developed worsening AKI with concern for cardio-renal syndrome.  An HD catheter was inserted 3/21 and CVVHD was initiated.   She remains on milrinone, amiodarone and heparin gtt's.  Rate control has been difficult to achieve.  Over the past 24 hours, she is net negative 3.5L.  She developed respiratory distress am of 3/22 and BiPAP was initiated.    Sent to sdu 3/29, returned now ICU , shock, CVVHD needs, fever   SUBJECTIVE:  Fever Low BP, on neo cvvhd planned  VITAL SIGNS: BP (!) 102/53 (BP Location: Left Wrist)   Pulse (!) 111   Temp 99 F (37.2 C) (Oral)   Resp 15   Ht 5' 7.5" (1.715 m)   Wt (!) 143.8 kg (317 lb)   LMP 09/07/2011   SpO2 95%   BMI 48.92 kg/m   HEMODYNAMICS: CVP:  [4 mmHg-13 mmHg] 13 mmHg  VENTILATOR SETTINGS: FiO2  (%):  [100 %] 100 %  INTAKE / OUTPUT: I/O last 3 completed shifts: In: 4428.2 [P.O.:100; I.V.:2533.2; Blood:995; IV Piggyback:800] Out: 1000 [Other:1000]  PHYSICAL EXAMINATION: Gen:      No acute distress, morbid obesity, home cpap  HEENT:  No ett, jvd Neck:      Supple, no masses or JVD Lungs:    reduced CV:         s1 s2 Tachy, -M/R/G. Abd:      Soft, NT, ND and +BS Ext:    Edema Skin:       Warm and dry; no rash Neuro:     Alert and oriented x 3  LABS:  BMET  Recent Labs Lab 01/08/17 0400 01/08/17 2142 01/09/17 0500  NA 135 132* 133*  K 4.1 3.2* 3.2*  CL 100* 96* 95*  CO2 24 27 27   BUN 27* 10 12  CREATININE 4.43* 2.58* 2.95*  GLUCOSE 94 99 89   Electrolytes  Recent Labs Lab 01/06/17 0529  01/07/17 0544 01/08/17 0400 01/08/17 2142 01/09/17 0500  CALCIUM 8.6*  < > 9.0 8.8* 8.3* 8.4*  MG 2.5*  --  2.5*  --  1.9  --   PHOS 2.7  < > 2.8 2.6  --  1.9*  < > = values in this interval not displayed.  CBC  Recent Labs Lab 01/07/17 0544  01/08/17 0400 01/09/17 0500  WBC 8.2 9.0 7.3  HGB 7.7* 7.3* 7.1*  HCT 26.2* 24.4* 23.9*  PLT 130* 139* 146*   Coag's  Recent Labs Lab 01/07/17 0941  INR 1.50   Sepsis Markers No results for input(s): LATICACIDVEN, PROCALCITON, O2SATVEN in the last 168 hours.  ABG  Recent Labs Lab 01/08/17 2305  PHART 7.440  PCO2ART 41.2  PO2ART 51.7*   Liver Enzymes  Recent Labs Lab 01/08/17 0400 01/08/17 2142 01/09/17 0500  AST  --  22  --   ALT  --  17  --   ALKPHOS  --  64  --   BILITOT  --  2.0*  --   ALBUMIN 2.5* 2.7* 2.6*   Cardiac Enzymes No results for input(s): TROPONINI, PROBNP in the last 168 hours.  Glucose  Recent Labs Lab 01/04/17 1538  GLUCAP 111*   Imaging Dg Chest Port 1 View  Result Date: 01/09/2017 CLINICAL DATA:  Fever. Hx stroke, HTN, PNA, CKD, former smoker. EXAM: PORTABLE CHEST 1 VIEW COMPARISON:  01/04/2017 FINDINGS: Since prior exam, the left internal jugular central venous line  has been removed. A new tunneled right internal jugular central venous line has been placed. The tip projects the right atrium. The right subclavian central venous line is stable. There is persistent cardiomegaly, central vascular congestion and mild interstitial thickening and hazy central airspace opacity consistent with pulmonary edema. No pneumothorax. IMPRESSION: 1. Cardiomegaly with mild persistent pulmonary edema similar to the previous exam. 2. New right internal jugular tunneled central venous line catheter tip projects in the right atrium. No pneumothorax. Electronically Signed   By: Lajean Manes M.D.   On: 01/09/2017 07:36   STUDIES:  US Renal 3/18 >> normal appearance of kidneys, moderate lower abdominal ascites CXR 3/22>> unchanged edema, bilateral effusions. I have reviewed the images personally.  CULTURES: None  ANTIBIOTICS: None  SIGNIFICANT EVENTS: 3/18  Admit with SOB, increased LE swelling > decompensated CHF  3/21  CVVHD initiated  3/22  PCCM consulted for respiratory distress  4/1- shock, cvvhd needs, fever  LINES/TUBES: R [place] TLC 3/19 >> out L IJ HD Cath 3/21 >>  A line 4/1 rt fem>>>  DISCUSSION: 61 y/o F admitted with decompensated CHF, volume overload, AKI with concern for cardiorenal syndrome.  CVVHD in progress.  Tolerating NIV/Bipap.   ASSESSMENT / PLAN:  PULMONARY A: Acute on Chronic Hypoxemic Hypercapneic Respiratory Failure ARF with pulm edema 4/1  exacerbation pEF + OSA + OHS + Pulm HTN Pt with h/o asthma/copd but it is not in exacerbation P:   - CPAP chronic use - Lower O2 to sats goal -neg balance needed again for cvvhd Repeat pcxr in am   CARDIOVASCULAR A:  AF with RVR - on coumadin  Acute on Chronic Diastolic CHF with Volume Overload Acute RV Failure / Cor Pulmonale  HTN  P:  - hep drip -amio drip -neo at 200 max, if to max, then change to levophed -no role vaso -milrinone pr chf service -get cortisol  RENAL A:   Acute on  Chronic Renal Failure - suspect cardiorenal syndrome, renal US normal Hypokalemia P:   - CVVH restart Chem in am  kvo, was 2 liters pos last 24 hours  k supp  GASTROINTESTINAL A:   Morbid Obesity  P:   - PO diet - Heart healthy, carb modified diet - Fluid restriction  HEMATOLOGIC A:   Anemia - suspect in setting of CKD + frequent phlebotomy  Got 2  units PRBC per cardiology 3/24. No evidence of bleed. Chronic Anticoagulation P:  - Follow CBC, no bleeding noted - Continue heparin anticoagulation  INFECTIOUS A:   Fever x 1, at risk CLABSI P:   -cefepime / vanc noted Follow BC Low threshold to dc lines, follow BC UA if urine made  ENDOCRINE A:   r/o rel AI P:   - Follow glucose on BMP 0get cortisol  NEUROLOGIC A:   Anxiety  P:   - PRN Xanax for anxiety.  Ccm time 35 min   Lavon Paganini. Titus Mould, MD, [place] Pgr: [facility]

## 2017-01-09 NOTE — Progress Notes (Addendum)
Subjective: Transferred back to ICU last night for hypotension, AF RVR, fever to 102 and concern for sepsis, neo gtt started to maintain blood pressure. Paige Marshall states that she is feeling fine this morning, a little tired and frustrated that she continues to get sick. She denies cough, pain, and states she does not recall what happened last night.   24 hr net +2.7L and +4 lbs  Called and discussed with PCCM, returning to their service today.  Objective: Vital signs in last 24 hours: Vitals:   01/09/17 0650 01/09/17 0700 01/09/17 0715 01/09/17 0800  BP:  (!) 94/56  98/68  Pulse: 99 (!) 106 98 (!) 122  Resp: 14 15 15  (!) 26  Temp:      TempSrc:      SpO2: 98% 90% 97% 92%  Weight:      Height:        Intake/Output Summary (Last 24 hours) at 01/09/17 1007 Last data filed at 01/09/17 0800  Gross per 24 hour  Intake          3739.75 ml  Output             1000 ml  Net          2739.75 ml    Physical Exam General appearance: Morbidly obese woman resting in bed, in no distress, conversational HENT: Normocephalic, atraumatic, moist MM Cardiovascular: Tachycardic, irregular, no audible m/r/g Respiratory/Chest: Clear anteriorly, normal work of breathing Abdomen: Obese, soft, non-tender, non-distended Skin: Warm, dry, intact Extremities: Obese bulk, trace/1+ edema to knees bilaterally, tender to palpation Neuro: Alert and oriented, strength grossly intact Psych: Irritable affect  Labs / Imaging / Procedures: CBC Latest Ref Rng & Units 01/09/2017 01/08/2017 01/07/2017  WBC 4.0 - 10.5 K/uL 7.3 9.0 8.2  Hemoglobin 12.0 - 15.0 g/dL 7.1(L) 7.3(L) 7.7(L)  Hematocrit 36.0 - 46.0 % 23.9(L) 24.4(L) 26.2(L)  Platelets 150 - 400 K/uL 146(L) 139(L) 130(L)   BMP Latest Ref Rng & Units 01/09/2017 01/08/2017 01/08/2017  Glucose 65 - 99 mg/dL 89 99 94  BUN 6 - 20 mg/dL 12 10 27(H)  Creatinine 0.44 - 1.00 mg/dL 2.95(H) 2.58(H) 4.43(H)  Sodium 135 - 145 mmol/L 133(L) 132(L) 135  Potassium 3.5 -  5.1 mmol/L 3.2(L) 3.2(L) 4.1  Chloride 101 - 111 mmol/L 95(L) 96(L) 100(L)  CO2 22 - 32 mmol/L 27 27 24   Calcium 8.9 - 10.3 mg/dL 8.4(L) 8.3(L) 8.8(L)   Dg Chest Port 1 View  Result Date: 01/09/2017 CLINICAL DATA:  Fever. Hx stroke, HTN, PNA, CKD, former smoker. EXAM: PORTABLE CHEST 1 VIEW COMPARISON:  01/04/2017 FINDINGS: Since prior exam, the left internal jugular central venous line has been removed. A new tunneled right internal jugular central venous line has been placed. The tip projects the right atrium. The right subclavian central venous line is stable. There is persistent cardiomegaly, central vascular congestion and mild interstitial thickening and hazy central airspace opacity consistent with pulmonary edema. No pneumothorax. IMPRESSION: 1. Cardiomegaly with mild persistent pulmonary edema similar to the previous exam. 2. New right internal jugular tunneled central venous line catheter tip projects in the right atrium. No pneumothorax. Electronically Signed   By: Lajean Manes M.D.   On: 01/09/2017 07:36    Assessment/Plan: Paige Marshall is a 61 y.o. woman with PMH CKD, HFrEF, morbid obesity, COPD/OHS, Afib admitted for AoC combined CHF.   Principal Problem:   Acute on chronic combined systolic and diastolic CHF (congestive heart failure) (HCC) Active Problems:  Essential hypertension   PAF (paroxysmal atrial fibrillation) (HCC)   Obstructive sleep apnea   Morbid obesity (HCC)   Obesity hypoventilation syndrome (HCC)   COPD mixed type (HCC)   Chronic respiratory failure with hypoxia (HCC)   Acute kidney injury superimposed on CKD (HCC)   Hyperkalemia   Thoracic aortic atherosclerosis (HCC)   Cardiorenal syndrome   Acute hypoxemic respiratory failure (HCC)   History of conscious sedation   Acute pulmonary edema (HCC)  Sepsis, overnight developed hypotension with cuff readings in 60s, afib RVR to 130s, mild confusion, and fever over 102. Given light IV fluids, amio  bolus, started on Vanc/Cefepime, and transferred to ICU given concern for sepsi. A-line placed for accurate BP monitoring and started on Neo-synephrine gtt. No leukocytosis this AM. No clear source at this point, tunneled cath recently placed and first dialysis session yesterday. No cough, CXR c/w pulm congestion no obvious infiltrate. Anuric so no urine to culture. No constipation or diarrhea.  -- Follow blood cultures, trend CBC -- Continue Vancomycin and Cefepime, continue until at least 48-72 hours afebrile and cultures mature -- Titrate Neo gtt to maintain MAP>60 -- Will contact PCCM  Acute on chronic combined systolic and diastolic CHF, diuresed 672 lbs over two weeks via CRRT, was transitioned to tunneled RIJ cath for HD. HF and renal teams following. Continues to have persistent AFib/AFlutter and RVR despite Dilt and Amio gtt. RHC on 3/30 revealed persistent PAH and RV failure despite Milrinone and normalized left side pressures. Planned for ablation on 4/2. Diuresed on CRRT to 306 lbs but now gaining weight back despite HD x1. Receiving a large amount of fluids via numerous IV medications and drips.  -- Appreciate HF and cardiology recs -- Milrinone gtt per HF recs -- Gained back 11 lbs in last three days, will diurese via CRRT today -- Strict I/O and daily weights  Atrial fibrillation, CHADSVASC score 5 on Coumadin, persistent atrial fibrillation or flutter during this hospitalization with RVR to 120s+, despite Diltiazem and Amiodarone gtt. Also on Heparin gtt Failed cardioversion on 3/27. Converted to MAT on 3/30 and transitioned to oral Dilltiazem. Again developed atrial fibrillation and RVR on 3/31-4/1 with hypotension and fever. -- S/p Amio bolus 150 mg overnight -- Continue Heparin, Amiodarone gtt -- Diltiazem 30 mg Q6h  -- Trend and maintain Mg > 2 and K > 4 -- Planned for ablation on 4/2 -- Telemetry  New ESRD, from [place], anuric, ARF secondary to cardiorenal syndrome in setting  of decompensated CHF. Has been on CRRT, transitioned to right IJ tunneled HD cath on 3/29, tolerated 1 HD session on 3/31 but persistently gaining weight. -- Back on CRRT today  Acute on chronic respiratory failure, with CHF on OSA/OHS, noncompliant with CPAP in past, maintaining adequate SpO2 overall on 5L [place] -- Supplemental O2 as necessary to maintain SpO2 > 92% -- CPAP QHS -- Diuresis per above  Anemia of chronic disease, secondary to ESRD, normocytic anemia to 7-8, ferritin normal. Hemoglobin at transfusion threshold 7.1 today.  -- Will receive 1 unit pRBCs today with CRRT -- Trend CBC, transfuse to maintain Hb > 7 -- Ferrous sulfate 325 mg TID -- Aranesp per renal recs  Constipation -- Miralax QD PRN, Dulcolax QD, Sorbitol QD PRN  Chronic pain NOS -- Continue Oxy IR 5 mg Q6H PRN, requiring 1-3 daily  Anxiety NOS -- Continue Xanax 0.25 Q6H PRN  Dispo: Anticipated discharge in approximately 3-4 day(s).   LOS: 14 days   Asencion Partridge,  MD 01/09/2017, 9:17 AM Pager: [phone]

## 2017-01-09 NOTE — Progress Notes (Signed)
Internal Medicine Attending:   I saw and examined the patient. I reviewed the resident's note and I agree with the resident's findings and plan as documented in the resident's note.  Overnight patient was transferred back to the ICU for hypotension in the setting of recurrent A. fib with RVR and a fever of 102 with a concern for possible sepsis. Patient was started on Neo-Synephrine drip for hypertension and an arterial line was placed for accurate reading of her blood pressure. Patient states that she feels well this morning because of frustration at her current situation.  Given her hypotension and fever there is concern for possible sepsis. Patient was started on antibiotics (vancomycin/cefepime) overnight and had blood cultures drawn. Chest x-ray done with no obvious infiltrate. Patient will need to be monitored in the ICU till her hypotension is improved. The etiology of her fevers remains uncertain at this time. If her blood cultures become positive will likely need her llines changed. We'll continue to monitor in the ICU for now.  Cardiology follow-up and recommendations appreciated. Continue with amiodarone for A. fib/aflutter. Milrinone to be stopped per cardiology. Nephrology to resume CRRT today.  The patient is now transferred back to the ICU. Will likely need transfer service to be PCCM for follow-up while in the ICU and we'll resume care once patient transferred back to the stepdown. We'll discuss with PCCM before transferring service

## 2017-01-09 NOTE — Consult Note (Addendum)
         [facility]    Date of Admission:  12/26/2016   Total days of antibiotics 1  Reason for consult: Automatic consultation for enterococcal bacteremia  Plan: 1. Start daptomycin 2. Discontinue vancomycin and cefepime 3. Repeat blood cultures the next 24-48 hours  Paige Marshall is a 61 year old with multiple medical problems who was admitted on 12/26/2016 with acute on chronic heart failure. Her hospital course has been complicated by atrial fibrillation and acute on chronic renal failure. She developed fever last night and now both blood cultures are growing vancomycin-resistant enterococcus. I will change her empiric antibiotic therapy to daptomycin. She will need repeat blood cultures soon. She has a new hemodialysis catheter in place that will need to be reassessed. We will provide a full consultation in the morning.  Michel Bickers, MD [facility] [phone] pager   [phone] cell 01/09/2017, 7:06 PM

## 2017-01-09 NOTE — Progress Notes (Signed)
ANTICOAGULATION CONSULT NOTE - Follow Up Consult  Pharmacy Consult for Heparin  Indication: atrial fibrillation  Allergies  Allergen Reactions  . Penicillins Hives, Itching, Swelling and Other (See Comments)    Tongue swelling Has patient had a PCN reaction causing immediate rash, facial/tongue/throat swelling, SOB or lightheadedness with hypotension: Yes  Clarified with the patient her penicillin allergy. Pt has tolerated cefepime in the past. Pt also states that she can take amoxicillin.    . Citrus Hives   Patient Measurements: Height: 5' 7.5" (171.5 cm) Weight: (!) 317 lb (143.8 kg) IBW/kg (Calculated) : 62.75  Vital Signs: Temp: 99 F (37.2 C) (04/01 1100) Temp Source: Oral (04/01 1100) BP: 102/53 (04/01 1000) Pulse Rate: 111 (04/01 1100)  Labs:  Recent Labs  01/07/17 0544 01/07/17 0941 01/08/17 0400 01/08/17 1301 01/08/17 2142 01/09/17 0500  HGB 7.7*  --  7.3*  --   --  7.1*  HCT 26.2*  --  24.4*  --   --  23.9*  PLT 130*  --  139*  --   --  146*  LABPROT  --  18.3*  --   --   --   --   INR  --  1.50  --   --   --   --   HEPARINUNFRC 0.25*  --  0.36 0.39  --  0.40  CREATININE 3.11*  --  4.43*  --  2.58* 2.95*    Estimated Creatinine Clearance: 31.2 mL/min (A) (by C-G formula based on SCr of 2.95 mg/dL (H)).  Assessment: 60yof on coumadin pta for afib, admitted with volume overload and renal failure. INR 2.84 on admit and coumadin initially resumed (received one dose of 5mg ). On 3/20, coumadin held, and she received 5mg  vitamin k to reverse INR w/need for CRRT. Heparin bridge started.  Heparin resumed s/p tunneled HD cath placement 3/29. Heparin level remains therapeutic.  Goal of Therapy:  Heparin level 0.3-0.7 units/ml Monitor platelets by anticoagulation protocol: Yes   Plan:  Continue heparin gtt at 3400 unit/hr Daily heparin level/CBC Monitor for s/sx of bleeding Follow up when able to resume coumadin  Melburn Popper, PharmD Clinical  Pharmacy Resident Pager: [phone] 01/09/17 11:48 AM

## 2017-01-09 NOTE — Significant Event (Signed)
Rapid Response Event Note  Overview: Time Called: 2300 Arrival Time: 2309 Event Type: Cardiac, Hypotension  Initial Focused Assessment: Fever, tachycardic and hypotension   Interventions: Tylenol and IV hydration  Plan of Care (if not transferred):  Event Summary: Called to assist with care of patient with tachycardia and hypotension. Alert and responsive. Neuro intact but appears restless in the bed. Skin is warm and dry. Back is hot to touch. Axillary temp of 102. Heart sounds irregular at rate of 130's. Doppler BP of 80. Due to patients presentation, fever, low BP and HR, we recommended a higher level of care. Spoke with Dr. Tommi Rumps and orders were receive to transfer patient to ICU.   Paige Marshall [place]

## 2017-01-10 ENCOUNTER — Inpatient Hospital Stay (HOSPITAL_COMMUNITY): Payer: Medicare Other

## 2017-01-10 ENCOUNTER — Encounter (HOSPITAL_COMMUNITY)
Admission: EM | Disposition: A | Discharge status: Nursing Home (Includes ICF) | Payer: Self-pay | Source: Home / Self Care | Attending: Pulmonary Disease

## 2017-01-10 DIAGNOSIS — Z1621 Resistance to vancomycin: Secondary | ICD-10-CM

## 2017-01-10 DIAGNOSIS — T827XXA Infection and inflammatory reaction due to other cardiac and vascular devices, implants and grafts, initial encounter: Secondary | ICD-10-CM

## 2017-01-10 DIAGNOSIS — R7881 Bacteremia: Secondary | ICD-10-CM

## 2017-01-10 DIAGNOSIS — G4733 Obstructive sleep apnea (adult) (pediatric): Secondary | ICD-10-CM

## 2017-01-10 LAB — CBC
HCT: 28.1 % — ABNORMAL LOW (ref 36.0–46.0)
Hemoglobin: 8.4 g/dL — ABNORMAL LOW (ref 12.0–15.0)
MCH: 26.3 pg (ref 26.0–34.0)
MCHC: 29.9 g/dL — AB (ref 30.0–36.0)
MCV: 88.1 fL (ref 78.0–100.0)
Platelets: 149 10*3/uL — ABNORMAL LOW (ref 150–400)
RBC: 3.19 MIL/uL — ABNORMAL LOW (ref 3.87–5.11)
RDW: 26 % — AB (ref 11.5–15.5)
WBC: 7.4 10*3/uL (ref 4.0–10.5)

## 2017-01-10 LAB — CK: CK TOTAL: 27 U/L — AB (ref 38–234)

## 2017-01-10 LAB — RENAL FUNCTION PANEL
ALBUMIN: 2.6 g/dL — AB (ref 3.5–5.0)
ANION GAP: 9 (ref 5–15)
Albumin: 2.6 g/dL — ABNORMAL LOW (ref 3.5–5.0)
Anion gap: 9 (ref 5–15)
BUN: 10 mg/dL (ref 6–20)
BUN: 9 mg/dL (ref 6–20)
CALCIUM: 8.4 mg/dL — AB (ref 8.9–10.3)
CHLORIDE: 99 mmol/L — AB (ref 101–111)
CO2: 26 mmol/L (ref 22–32)
CO2: 26 mmol/L (ref 22–32)
Calcium: 8.7 mg/dL — ABNORMAL LOW (ref 8.9–10.3)
Chloride: 101 mmol/L (ref 101–111)
Creatinine, Ser: 2.39 mg/dL — ABNORMAL HIGH (ref 0.44–1.00)
Creatinine, Ser: 2.24 mg/dL — ABNORMAL HIGH (ref 0.44–1.00)
GFR calc non Af Amer: 21 mL/min — ABNORMAL LOW (ref >60)
GFR calc non Af Amer: 23 mL/min — ABNORMAL LOW (ref >60)
GFR, EST AFRICAN AMERICAN: 25 mL/min — AB (ref >60)
GFR, EST AFRICAN AMERICAN: 27 mL/min — AB (ref >60)
GLUCOSE: 122 mg/dL — AB (ref 65–99)
Glucose, Bld: 105 mg/dL — ABNORMAL HIGH (ref 65–99)
PHOSPHORUS: 2.2 mg/dL — AB (ref 2.5–4.6)
POTASSIUM: 3.5 mmol/L (ref 3.5–5.1)
POTASSIUM: 3.9 mmol/L (ref 3.5–5.1)
Phosphorus: 1.6 mg/dL — ABNORMAL LOW (ref 2.5–4.6)
SODIUM: 136 mmol/L (ref 135–145)
Sodium: 134 mmol/L — ABNORMAL LOW (ref 135–145)

## 2017-01-10 LAB — TYPE AND SCREEN
ABO/RH(D): A POS
Antibody Screen: NEGATIVE
UNIT DIVISION: 0
UNIT DIVISION: 0

## 2017-01-10 LAB — COOXEMETRY PANEL
Carboxyhemoglobin: 2.3 % — ABNORMAL HIGH (ref 0.5–1.5)
METHEMOGLOBIN: 1.1 % (ref 0.0–1.5)
O2 SAT: 72.8 %
TOTAL HEMOGLOBIN: 8.1 g/dL — AB (ref 12.0–16.0)

## 2017-01-10 LAB — BPAM RBC
Blood Product Expiration Date: 201804142359
Blood Product Expiration Date: 201804202359
ISSUE DATE / TIME: 201803311433
ISSUE DATE / TIME: 201804011028
UNIT TYPE AND RH: 6200
Unit Type and Rh: 6200

## 2017-01-10 LAB — MAGNESIUM: MAGNESIUM: 2.1 mg/dL (ref 1.7–2.4)

## 2017-01-10 LAB — HEPARIN LEVEL (UNFRACTIONATED): Heparin Unfractionated: 0.66 IU/mL (ref 0.30–0.70)

## 2017-01-10 SURGERY — A-FLUTTER ABLATION

## 2017-01-10 MED ORDER — SODIUM CHLORIDE 0.9 % IV SOLN: INTRAVENOUS | Status: DC | PRN

## 2017-01-10 MED ORDER — SODIUM CHLORIDE 0.9 % IV SOLN: 1000.0000 mg | Freq: Once | INTRAVENOUS | Status: DC

## 2017-01-10 MED ORDER — SODIUM PHOSPHATES 45 MMOLE/15ML IV SOLN
30.0000 mmol | Freq: Once | INTRAVENOUS | Status: AC
  Administered 2017-01-10: 30 mmol via INTRAVENOUS
  Filled 2017-01-10: qty 10

## 2017-01-10 NOTE — Progress Notes (Signed)
   01/10/17 1040  Clinical Encounter Type  Visited With Patient  Visit Type Initial  Referral From Patient  Consult/Referral To Chaplain  Recommendations follow up  Spiritual Encounters  Spiritual Needs Prayer;Emotional  Stress Factors  Patient Stress Factors None identified  Not able to see pt. At this time, will check back later, nurses working with patient, will follow up when able to extend time with pt.   Chaplain Leisl Spurrier A. Nonnie Done, MA-PC ,BA-REL/PHIL   [phone]

## 2017-01-10 NOTE — Progress Notes (Signed)
Advanced Heart Failure Rounding Note  PCP: EDWARDS, MICHELLE P, NP  Primary Cardiologist: Dr. Gwenlyn Found   Subjective:    Developed septic shock over the weekend with BPs in the 70s. Now on norepi 14. SBP 130s by a-line (110 by cuff)  Feeling better. ID and CCM have seen. Denies SOB. No fevers. Back in AF rates 110-130 on IV amio   Back on CVVHD. Todays Co-ox is 73%.  Blood CX 2/2=>VRE. ID following.    Objective:   Weight Range: (!) 323 lb (146.5 kg) Body mass index is 49.84 kg/m.   Vital Signs:   Temp:  [97.7 F (36.5 C)-99 F (37.2 C)] 97.9 F (36.6 C) (04/02 0335) Pulse Rate:  [64-138] 119 (04/02 0700) Resp:  [13-23] 14 (04/02 0700) BP: (78-125)/(45-73) 101/65 (04/02 0700) SpO2:  [85 %-100 %] 96 % (04/02 0700) Arterial Line BP: (83-130)/(41-70) 118/60 (04/02 0700) FiO2 (%):  [50 %-100 %] 50 % (04/02 0000) Weight:  [323 lb (146.5 kg)] 323 lb (146.5 kg) (04/02 0500) Last BM Date: 01/06/17  Weight change: Filed Weights   01/08/17 1720 01/09/17 0500 01/10/17 0500  Weight: (!) 313 lb 0.9 oz (142 kg) (!) 317 lb (143.8 kg) (!) 323 lb (146.5 kg)    Intake/Output:   Intake/Output Summary (Last 24 hours) at 01/10/17 0807 Last data filed at 01/10/17 0800  Gross per 24 hour  Intake          3144.27 ml  Output             1756 ml  Net          1388.27 ml     Physical Exam: General: Obese. Lying in bed NAD HEENT: Normal anicteric Neck: Supple. JVP difficult to assess  Carotids 2+ bilat; no bruits RIJ tunneled cath. R [place] TLC Cor: PMI non-palpable. Distant IRR tachy no murmur Lungs: Diminished throughout.  No wheeing  Abdomen: Morbidly obese, NT, ND, no HSM. No bruits or masses. +BS  Extremities: no cyanosis, clubbing, rash. Warm 2+ edema  Neuro: Alert & oriented x 3. Cranial nerves grossly intact. Moves all 4 extremities w/o difficulty. Affect pleasant but anxious    Telemetry: AF 110-130 Personally reviewed     Labs: CBC  Recent Labs  01/09/17 0500  01/10/17 0515  WBC 7.3 7.4  HGB 7.1* 8.4*  HCT 23.9* 28.1*  MCV 88.8 88.1  PLT 146* 867*   Basic Metabolic Panel  Recent Labs  01/08/17 2142  01/09/17 1613 01/10/17 0515  NA 132*  < > 134* 134*  K 3.2*  < > 3.7 3.5  CL 96*  < > 100* 99*  CO2 27  < > 25 26  GLUCOSE 99  < > 128* 105*  BUN 10  < > 15 10  CREATININE 2.58*  < > 3.05* 2.39*  CALCIUM 8.3*  < > 8.6* 8.7*  MG 1.9  --   --  2.1  PHOS  --   < > 2.0* 1.6*  < > = values in this interval not displayed.  BNP: BNP (last 3 results)  Recent Labs  09/18/16 1126 11/07/16 1633 12/26/16 1643  BNP 991.0* 490.4* 1,116.0*    Medications:     Scheduled Medications: . bisacodyl  10 mg Oral Daily  . Chlorhexidine Gluconate Cloth  6 each Topical Daily  . darbepoetin (ARANESP) injection - NON-DIALYSIS  150 mcg Subcutaneous Q Tue-1800  . mouth rinse  15 mL Mouth Rinse BID  . potassium chloride  30  mEq Oral Daily  . senna-docusate  2 tablet Oral QHS  . sodium phosphate  Dextrose 5% IVPB  30 mmol Intravenous Once    Infusions: . amiodarone 30 mg/hr (01/10/17 0232)  . heparin 3,400 Units/hr (01/10/17 0423)  . norepinephrine (LEVOPHED) Adult infusion 16 mcg/min (01/10/17 0006)  . dialysis replacement fluid (prismasate) 300 mL/hr at 01/10/17 0550  . dialysis replacement fluid (prismasate) 300 mL/hr at 01/10/17 0548  . dialysate (PRISMASATE) 1,500 mL/hr at 01/10/17 0552    PRN Medications: Place/Maintain arterial line **AND** sodium chloride, acetaminophen, ALPRAZolam, heparin, heparin, ondansetron (ZOFRAN) IV, oxyCODONE, polyethylene glycol, promethazine, sodium chloride, sorbitol   Assessment/Plan   1. Acute on chronic diastolic CHF 2. Paroxysmal atrial fibrillation on Coumadin    -s/p DC-CV on 3/27 3. Acute on chronic kidney disease stage IV 4. Obesity hypoventilation syndrome.  5. Hyperkalemia 6. PAH with acute RV failure/cor pulmonale 7. OSA, noncompliant with CPAP 8. Acute on chronic respiratory failure 9.  Anemia 10. NSVT 11. Sepsis, VRE  Blood CX =>VRE. Antibiotics per ID.   CRRT per nephrology   Continue amio 30 mg per hour. Ablation on hold. Eventually she will need TEE.   Length of Stay: [place], NP  01/10/2017, 8:07 AM  Advanced Heart Failure Team  Pager [phone] M-F 7am-4pm.  Please contact [facility] for night-coverage after hours (4p -7a ) and weekends on [email or web address]  Agree with above.  Paige Marshall had a drastic downturn over the weekend with VRE sepsis. Hemodynamically improving. ID managing abx. SBP stable on norepi. Will wean as tolerated. Weight back up and now back on CVVHD.  Was in AFL previously. Now back in AF (Discussed with EP). AFL ablation now on hold. Will continue amio. Given multiple atrial arrhythmias and lack of ability to rate control with amio may eventually need AVN ablation +CRT but will not be candidate for some time given critical illness and bacteremia.  Ideally would like to get all lines out but tunneled cath may be an issue. (Was placed only 24 hours prior to bacteremia. Will d/w CCM and ID.  With enterococcus will need TEE at some point if she will tolerate.   She remains very tenuous.   The patient is critically ill with multiple organ systems failure and requires high complexity decision making for assessment and support, frequent evaluation and titration of therapies, application of advanced monitoring technologies and extensive interpretation of multiple databases.   Critical Care Time devoted to patient care services described in this note is 45 Minutes. This time reflects time of care of this signee, Dr. Pierre Bali who personally examined the patient and determined the plan of care. This critical care time does not reflect procedure time, teaching time or supervisory time of our PA/NP/resident but could involve care discussion and coordination time.  Glori Bickers, MD  9:20 AM

## 2017-01-10 NOTE — Progress Notes (Signed)
ANTICOAGULATION CONSULT NOTE - Follow Up Consult  Pharmacy Consult for Heparin  Indication: atrial fibrillation  Allergies  Allergen Reactions  . Penicillins Hives, Itching, Swelling and Other (See Comments)    Tongue swelling Has patient had a PCN reaction causing immediate rash, facial/tongue/throat swelling, SOB or lightheadedness with hypotension: Yes  Clarified with the patient her penicillin allergy. Pt has tolerated cefepime in the past. Pt also states that she can take amoxicillin.    . Citrus Hives   Patient Measurements: Height: 5' 7.5" (171.5 cm) Weight: (!) 323 lb (146.5 kg) IBW/kg (Calculated) : 62.75  Vital Signs: Temp: 98.5 F (36.9 C) (04/02 1210) Temp Source: Oral (04/02 1210) BP: 110/58 (04/02 0900) Pulse Rate: 115 (04/02 1200)  Labs:  Recent Labs  01/08/17 0400 01/08/17 1301  01/09/17 0500 01/09/17 1613 01/10/17 0515  HGB 7.3*  --   --  7.1*  --  8.4*  HCT 24.4*  --   --  23.9*  --  28.1*  PLT 139*  --   --  146*  --  149*  HEPARINUNFRC 0.36 0.39  --  0.40  --  0.66  CREATININE 4.43*  --   < > 2.95* 3.05* 2.39*  CKTOTAL  --   --   --   --   --  27*  < > = values in this interval not displayed.  Estimated Creatinine Clearance: 39 mL/min (A) (by C-G formula based on SCr of 2.39 mg/dL (H)).  Assessment: 60yof on coumadin pta for afib, admitted with volume overload and renal failure. INR 2.84 on admit and coumadin initially resumed (received one dose of 5mg ). On 3/20, coumadin held, and she received 5mg  vitamin k to reverse INR w/need for CRRT. Heparin bridge started.  Heparin resumed s/p tunneled HD cath placement 3/29. Heparin level continues to be at goal this am but trending up, will follow closely. Hgb up to 8.4 after 1 unit given yesterday.  Note that line is being placed today and heparin will be off for an hour. No adjustments needed.  Goal of Therapy:  Heparin level 0.3-0.7 units/ml Monitor platelets by anticoagulation protocol: Yes    Plan:  Continue heparin gtt at 3400 unit/hr Daily heparin level/CBC Monitor for s/sx of bleeding Follow up when able to resume coumadin  Erin Hearing PharmD., BCPS Clinical Pharmacist Pager [phone] 01/10/2017 12:36 PM

## 2017-01-10 NOTE — Progress Notes (Signed)
Given fever, bacteremia, planned EPS/ablation for today has been cancelled.  Dr. Lovena Le informed the patient.  Tommye Standard, PA-C

## 2017-01-10 NOTE — Progress Notes (Signed)
PULMONARY / CRITICAL CARE MEDICINE   Name: Paige Marshall MRN: [id number] DOB: [birth year]    ADMISSION DATE:  12/26/2016 CONSULTATION DATE:  12/30/16  REFERRING MD:  Dr. Tommy Medal   CHIEF COMPLAINT:  Shortness of breath   HISTORY OF PRESENT ILLNESS:  61 y/o F admitted from rehab facility (resident since November 2017) on 3/18 with complaints of shortness of breath & lower extremity swelling.    The patient has a medical history of morbid obesity / wheelchair bound, untreated OHS, chronic combined systolic / diastolic CHF, NICM, AF on coumadin, HTN, 3L O2 dependent, pulmonary hypertension, former smoker (11 pk years) with COPD & CKD III.  The patient suffered a left fibular fracture in late 2017.  She was recently admitted from 1/28-11/13/16 for acute CHF exacerbation.  At that time, her discharge weight was 370lbs.    She returned from the rehab facility on 3/18 with increased SOB and lower extremity swelling.  She if followed by Dr. Gwenlyn Found for cardiology and was recently switched to lasix and metolazone.  Lasix was discontinued at the SNF due to rising sr cr.  Her admit weight was 411lbs.  She was admitted by IMTS for decompensated CHF.  She developed worsening AKI with concern for cardio-renal syndrome.  An HD catheter was inserted 3/21 and CVVHD was initiated.   She remains on milrinone, amiodarone and heparin gtt's.  Rate control has been difficult to achieve.  Over the past 24 hours, she is net negative 3.5L.  She developed respiratory distress am of 3/22 and BiPAP was initiated.    Sent to sdu 3/29, returned now ICU , shock, CVVHD needs, fever  SUBJECTIVE:  This morning, her friend is at bedside. She is concerned about recent developments, and I reviewed her course with her again. She feels better overall and is optimistic.  VITAL SIGNS: BP (!) 110/58   Pulse (!) 128   Temp 97.9 F (36.6 C) (Axillary)   Resp 19   Ht 5' 7.5" (1.715 m)   Wt (!) 323 lb (146.5 kg)   LMP 09/07/2011    SpO2 96%   BMI 49.84 kg/m   HEMODYNAMICS: CVP:  [11 mmHg-18 mmHg] 15 mmHg  VENTILATOR SETTINGS: FiO2 (%):  [50 %-100 %] 50 %  INTAKE / OUTPUT: I/O last 3 completed shifts: In: 5220.7 [P.O.:450; I.V.:3515.7; Blood:335; IV Piggyback:920] Out: 5176 [Other:1681]  PHYSICAL EXAMINATION: Physical Exam  Constitutional: She is oriented to person, place, and time. No distress.  HENT:  Head: Normocephalic and atraumatic.  Eyes: Conjunctivae are normal. Pupils are equal, round, and reactive to light.  Neck:  Obese neck, difficult to visualize JVD.  Cardiovascular: Normal rate and regular rhythm.   Pulmonary/Chest: Effort normal. No respiratory distress.  Abdominal: Soft. She exhibits no distension.  Neurological: She is alert and oriented to person, place, and time.  Mildly tremulous.  Skin: She is not diaphoretic.     LABS:  BMET  Recent Labs Lab 01/09/17 0500 01/09/17 1613 01/10/17 0515  NA 133* 134* 134*  K 3.2* 3.7 3.5  CL 95* 100* 99*  CO2 27 25 26   BUN 12 15 10   CREATININE 2.95* 3.05* 2.39*  GLUCOSE 89 128* 105*   Electrolytes  Recent Labs Lab 01/07/17 0544  01/08/17 2142 01/09/17 0500 01/09/17 1613 01/10/17 0515  CALCIUM 9.0  < > 8.3* 8.4* 8.6* 8.7*  MG 2.5*  --  1.9  --   --  2.1  PHOS 2.8  < >  --  1.9*  2.0* 1.6*  < > = values in this interval not displayed.  CBC  Recent Labs Lab 01/08/17 0400 01/09/17 0500 01/10/17 0515  WBC 9.0 7.3 7.4  HGB 7.3* 7.1* 8.4*  HCT 24.4* 23.9* 28.1*  PLT 139* 146* 149*   Coag's  Recent Labs Lab 01/07/17 0941  INR 1.50   Sepsis Markers No results for input(s): LATICACIDVEN, PROCALCITON, O2SATVEN in the last 168 hours.  ABG  Recent Labs Lab 01/08/17 2305  PHART 7.440  PCO2ART 41.2  PO2ART 51.7*   Liver Enzymes  Recent Labs Lab 01/08/17 2142 01/09/17 0500 01/09/17 1613 01/10/17 0515  AST 22  --   --   --   ALT 17  --   --   --   ALKPHOS 64  --   --   --   BILITOT 2.0*  --   --   --    ALBUMIN 2.7* 2.6* 2.7* 2.6*   Cardiac Enzymes No results for input(s): TROPONINI, PROBNP in the last 168 hours.  Glucose  Recent Labs Lab 01/04/17 1538  GLUCAP 111*   Imaging No results found.  STUDIES:  US Renal 3/18 >> normal appearance of kidneys, moderate lower abdominal ascites CXR 3/22>> unchanged edema, bilateral effusions. I have reviewed the images personally. CXR 4/02>>  CULTURES: 4/01 VRE  4/02 Pending  ANTIBIOTICS: 4/01 Vanc/cefepime -> daptomycin  SIGNIFICANT EVENTS: 3/18  Admit with SOB, increased LE swelling > decompensated CHF  3/21  CVVHD initiated  3/22  PCCM consulted for respiratory distress  4/01  PCCM reconsulted for shock, cvvhd needs, fever  LINES/TUBES: L IJ HD Cath 3/21 >> removed R [place] TLC 3/19 >>  A line 4/1 rt fem >>  DISCUSSION: Ms. Boettner is a 61 y/o woman with poorly controlled OHS, chronic combined systolic and diastolic CHF, a-fib on chronic anticoagulation, hypertension, COPD, CKD Stage 3 hospitalized for decompensated CHF complicated by cardiorenal syndrome requiring CRRT and now VRE bacteremia.  ASSESSMENT / PLAN:  PULMONARY A: Acute on Chronic Hypoxemic Hypercapneic Respiratory Failure ARF with pulm edema 4/1 [CHF + OSA + OHS + Pulm HTN] H/o asthma/copd but not in exacerbation  P:   - CPAP chronic use - Lower O2 to sats goal  CARDIOVASCULAR A: Acute on Chronic Diastolic CHF with Volume Overload Acute RV Failure / Cor Pulmonale  AF with RVR - on coumadin  HTN   P: Continue Neo at 200 max - Continue amiodarone for rhythm control - Continue milrinone per Heart Failure - Continue heparin IV - Echo pending given VRE bacteremia  RENAL A:   Acute on Chronic Renal Failure 2/2 cardiorenal syndrome Hypokalemia, resolved  P:  CRRT per Nephrology -Follow electrolytes  GASTROINTESTINAL A:  Morbid Obesity   P: Heart healthy, carb modified diet with fluid restriction  HEMATOLOGIC A: Anemia - suspect in setting of  CKD + frequent phlebotomy  Got 2 units PRBC per cardiology 3/24. No evidence of bleed. Chronic Anticoagulation  P: Follow CBC, no bleeding noted. - Continue heparin anticoagulation  INFECTIOUS A:  VRE bacteremia  P:  Daptomycin per ID, awaiting full recommendations  ENDOCRINE A:  AI less likely: PM cortisol 14.5 yesterday.  P:  Follow glucose on BMP  NEUROLOGIC A: Anxiety   P:  Continue Xanax for anxiety.  Charlott Rakes, PGY3 Internal Medicine Pager: [phone]  Attending Note:  I have examined patient, reviewed labs, studies and notes. I have discussed the case with Dr Posey Pronto, and I agree with the data and plans as amended  above.  61 year old woman with morbid obesity, obstructive sleep apnea/obesity hypoventilation syndrome, diastolic CHF with hypertension, atrial fibrillation/flutter. Hospitalized with total body volume overload and renal failure. Blood pressure and fluid status have been labile. This has also caused her to have bouts of atrial fibrillation with rapid ventricular response. She returned to the ICU 4/1 with hypotension, tachycardia. Evaluation has revealed positive blood cultures for VRE. We will continue her current antibiotics, appreciate ID assistance. She has a new tunneled catheter that is in place for dialysis. We will remove her other central line and her art line, replace as needed. Continue continuous dialysis and wean pressors as we are able.   Independent critical care time is 35 minutes.   Baltazar Apo, MD, PhD 01/10/2017, 2:13 PM [facility] [phone] or if no answer [phone]

## 2017-01-10 NOTE — Progress Notes (Signed)
Subjective: Interval History: has complaints very concerned how things are going.  Objective: Vital signs in last 24 hours: Temp:  [97.7 F (36.5 C)-99 F (37.2 C)] 97.9 F (36.6 C) (04/02 0335) Pulse Rate:  [64-138] 119 (04/02 0700) Resp:  [13-26] 14 (04/02 0700) BP: (78-125)/(45-73) 101/65 (04/02 0700) SpO2:  [85 %-100 %] 96 % (04/02 0700) Arterial Line BP: (83-130)/(41-70) 118/60 (04/02 0700) FiO2 (%):  [50 %-100 %] 50 % (04/02 0000) Weight:  [146.5 kg (323 lb)] 146.5 kg (323 lb) (04/02 0500) Weight change: 3.512 kg (7 lb 11.9 oz)  Intake/Output from previous day: 04/01 0701 - 04/02 0700 In: 3435.3 [P.O.:450; I.V.:2530.3; Blood:335; IV Piggyback:120] Out: 6283  Intake/Output this shift: No intake/output data recorded.  General appearance: alert, cooperative, morbidly obese and pale Resp: rales bibasilar Chest wall: RIJ PC Cardio: irregularly irregular rhythm and systolic murmur: holosystolic 2/6, blowing at apex GI: massive, pos bs, liver down 6 cm Extremities: massive, pos bs, liver down 6 cm  3-4+ edema  Lab Results:  Recent Labs  01/09/17 0500 01/10/17 0515  WBC 7.3 7.4  HGB 7.1* 8.4*  HCT 23.9* 28.1*  PLT 146* 149*   BMET:  Recent Labs  01/09/17 1613 01/10/17 0515  NA 134* 134*  K 3.7 3.5  CL 100* 99*  CO2 25 26  GLUCOSE 128* 105*  BUN 15 10  CREATININE 3.05* 2.39*  CALCIUM 8.6* 8.7*    Recent Labs  01/07/17 1410  PTH 24   Iron Studies: No results for input(s): IRON, TIBC, TRANSFERRIN, FERRITIN in the last 72 hours.  Studies/Results: Dg Chest Port 1 View  Result Date: 01/09/2017 CLINICAL DATA:  Fever. Hx stroke, HTN, PNA, CKD, former smoker. EXAM: PORTABLE CHEST 1 VIEW COMPARISON:  01/04/2017 FINDINGS: Since prior exam, the left internal jugular central venous line has been removed. A new tunneled right internal jugular central venous line has been placed. The tip projects the right atrium. The right subclavian central venous line is stable.  There is persistent cardiomegaly, central vascular congestion and mild interstitial thickening and hazy central airspace opacity consistent with pulmonary edema. No pneumothorax. IMPRESSION: 1. Cardiomegaly with mild persistent pulmonary edema similar to the previous exam. 2. New right internal jugular tunneled central venous line catheter tip projects in the right atrium. No pneumothorax. Electronically Signed   By: Lajean Manes M.D.   On: 01/09/2017 07:36    I have reviewed the patient's current medications.  Assessment/Plan: 1 AKI/CKD vol xs . Solute /acid/base/K ok.  Need to slowly lower vol 2 Severe vol xs 3 RHF 4 Afib 5 OSA 6 REsp failure 7 DM 8 Anemia P CRRT, replete phos, counsel, support bp, esa,      LOS: 15 days   Victorine Mcnee L 01/10/2017,7:52 AM

## 2017-01-10 NOTE — Procedures (Signed)
Central Venous Catheter Insertion Procedure Note Paige Marshall [id number] 09-08-1956  Procedure: Insertion of Central Venous Catheter Indications: Assessment of intravascular volume, Drug and/or fluid administration and Frequent blood sampling  Procedure Details Consent: Risks of procedure as well as the alternatives and risks of each were explained to the (patient/caregiver).  Consent for procedure obtained. Time Out: Verified patient identification, verified procedure, site/side was marked, verified correct patient position, special equipment/implants available, medications/allergies/relevent history reviewed, required imaging and test results available.  Performed  Maximum sterile technique was used including antiseptics, cap, gloves, gown, hand hygiene, mask and sheet. Skin prep: Chlorhexidine; local anesthetic administered A antimicrobial bonded/coated triple lumen catheter was placed in the left internal jugular vein using the Seldinger technique.  Evaluation Blood flow good Complications: No apparent complications Patient did tolerate procedure well. Chest X-ray ordered to verify placement.  CXR: pending.  Hayden Pedro, AG-ACNP [facility]  Pgr: [phone]  PCCM Pgr: [phone]

## 2017-01-10 NOTE — Progress Notes (Signed)
Order placed for arterial line.  Two RTs attempted maximum amount of times however all attempts were unsuccessful.  MD is aware and instructed to do not stick anymore.  Will continue to monitor.

## 2017-01-10 NOTE — Consult Note (Signed)
Date of Admission:  12/26/2016  Date of Consult:  01/10/2017  Reason for Consult: VRE bacteremia Referring Physician: Terrilyn Saver "auto consult" and Dr. Titus Mould   HPI: Paige Marshall is an 61 y.o. female known to me when she was on the Teaching Service with admission with severe heart failure, cardiorenal syndrome, AF. She has been on HD to assist with fluid removal and in addition to her hemodialysis catheter she has a central line or femoral line in place. She became febrile the last 24-48 hours and blood cultures were drawn which are now showing VRE by "biofire" in 2/2 sites. She has been changed from broad-spectrum antibiotics to daptomycin. When she initially presented to the teaching service she was not febrile and therefore there were no blood cultures obtained when she was admitted to the hospital. She does not have any abdominal symptoms at all presently. I suspect her VRE bacteremia may be due to 1 of her central lines and regardless of what the origin as I think she will very likely need to have her lines removed to effect clearance of her bacteremia. This of course is not "do able in her current circumstancwes of being on pressors and CVVHD.      Past Medical History:  Diagnosis Date  . Asthma   . Chronic combined systolic and diastolic CHF (congestive heart failure) ([place])   . CKD (chronic kidney disease), stage III   . COPD (chronic obstructive pulmonary disease) ([place])   . Degenerative joint disease of both lower legs   . Former smoker   . Gout    hx in "both knees" (01/14/2015)  . H. pylori infection 01/03/2014   +breath test.    . High cholesterol   . Hypertension   . Morbid obesity ([place])   . NICM (nonischemic cardiomyopathy) ([place])    a. LHC 01/2015: normal cors, EF 45-50%. b. EF 50-55% in 08/2016.  [name] OSA (obstructive sleep apnea)   . Persistent atrial fibrillation ([place])   . Pneumonia X 2  . Pulmonary hypertension   . Rheumatoid arthritis ([place])   . Stroke  [facility])     Past Surgical History:  Procedure Laterality Date  . ANGIOGRAM/LV (CONGENITAL)  2007  . BREATH TEK H PYLORI N/A 12/31/2013   Procedure: [place];  Surgeon: Gayland Curry, MD;  Location: [facility];  Service: General;  Laterality: N/A;  . CORNEAL TRANSPLANT Right ~ 2010  . CORONARY ANGIOPLASTY WITH STENT PLACEMENT  11/04/2005   "1"  . DOPPLER ECHOCARDIOGRAPHY     2 D   EF of 45%  . EYE SURGERY    . IR GENERIC HISTORICAL  01/06/2017   IR FLUORO GUIDE CV LINE RIGHT 01/06/2017 Corrie Mckusick, DO [facility]  . IR GENERIC HISTORICAL  01/06/2017   IR US GUIDE VASC ACCESS RIGHT 01/06/2017 Corrie Mckusick, DO [facility]  . LEFT HEART CATHETERIZATION WITH CORONARY ANGIOGRAM N/A 01/20/2015   Procedure: LEFT HEART CATHETERIZATION WITH CORONARY ANGIOGRAM;  Surgeon: Lorretta Harp, MD;  Location: [facility] CATH LAB;  Service: Cardiovascular;  Laterality: N/A;  . RIGHT HEART CATH N/A 01/07/2017   Procedure: Right Heart Cath;  Surgeon: Jolaine Artist, MD;  Location: [place] CV [facility];  Service: Cardiovascular;  Laterality: N/A;  . sleep study  2011  . stress myocardial dipyridamole perfusion    . TUBAL LIGATION  1982  . venous duplex ultrasound  2013    Social History:  reports that she quit  smoking about 8 years ago. Her smoking use included Cigarettes. She has a 11.55 pack-year smoking history. She has never used smokeless tobacco. She reports that she does not drink alcohol or use drugs.   Family History  Problem Relation Age of Onset  . Heart disease Mother   . Cancer Father     colon  . Hypertension Other     Allergies  Allergen Reactions  . Penicillins Hives, Itching, Swelling and Other (See Comments)    Tongue swelling Has patient had a PCN reaction causing immediate rash, facial/tongue/throat swelling, SOB or lightheadedness with hypotension: Yes  Clarified with the patient her penicillin allergy. Pt has tolerated cefepime in the past. Pt also states that she can  take amoxicillin.    . Citrus Hives     Medications: I have reviewed patients current medications as documented in Epic Anti-infectives    Start     Dose/Rate Route Frequency Ordered Stop   01/09/17 2200  ceFEPIme (MAXIPIME) 1 g in dextrose 5 % 50 mL IVPB  Status:  Discontinued     1 g 100 mL/hr over 30 Minutes Intravenous Every 24 hours 01/09/17 0033 01/09/17 1118   01/09/17 2200  ceFEPIme (MAXIPIME) 2 g in dextrose 5 % 50 mL IVPB  Status:  Discontinued     2 g 100 mL/hr over 30 Minutes Intravenous Every 24 hours 01/09/17 1118 01/09/17 1130   01/09/17 2200  ceFEPIme (MAXIPIME) 2 g in dextrose 5 % 50 mL IVPB  Status:  Discontinued     2 g 100 mL/hr over 30 Minutes Intravenous Every 12 hours 01/09/17 1130 01/09/17 1914   01/09/17 1900  DAPTOmycin (CUBICIN) 1,000 mg in sodium chloride 0.9 % IVPB     1,000 mg 240 mL/hr over 30 Minutes Intravenous  Once 01/09/17 1852 01/09/17 2013   01/09/17 0045  vancomycin (VANCOCIN) 2,000 mg in sodium chloride 0.9 % 500 mL IVPB     2,000 mg 250 mL/hr over 120 Minutes Intravenous  Once 01/09/17 0033 01/09/17 0318   01/09/17 0045  ceFEPIme (MAXIPIME) 2 g in dextrose 5 % 50 mL IVPB     2 g 100 mL/hr over 30 Minutes Intravenous  Once 01/09/17 0033 01/09/17 0149   01/06/17 0545  vancomycin (VANCOCIN) 1,500 mg in sodium chloride 0.9 % 500 mL IVPB     1,500 mg 250 mL/hr over 120 Minutes Intravenous To Radiology 01/06/17 0533 01/06/17 1805         ROS:  as in HPI otherwise remainder of 12 point Review of Systems is negative   Blood pressure (!) 110/58, pulse (!) 115, temperature 98.5 F (36.9 C), temperature source Oral, resp. rate 19, height 5' 7.5" (1.715 m), weight (!) 323 lb (146.5 kg), last menstrual period 09/07/2011, SpO2 95 %. General: Alert and awake, oriented x3, not in any acute distress, morbidly obese and with the tremor in her right arm that she says has been there for some time.  HEENT: anicteric sclera,  EOMI, posterior oropharynx  difficult to visualize Cardiovascular: Tachycardic irregular irregular rhythm no murmur rubs or gallops Pulmonary: Fairly clear to auscultation bilaterally anteriorly Gastrointestinal: soft nontender, nondistended, normal bowel sounds, Musculoskeletal: no  clubbing or edema noted bilaterally Skin, soft tissue: no rashes Neuro: nonfocal, strength and sensation intact   Results for orders placed or performed during the hospital encounter of 12/26/16 (from the past 48 hour(s))  Heparin level (unfractionated)     Status: None   Collection Time: 01/08/17  1:01 PM  Result Value Ref Range   Heparin Unfractionated 0.39 0.30 - 0.70 IU/mL    Comment:        IF HEPARIN RESULTS ARE BELOW EXPECTED VALUES, AND PATIENT DOSAGE HAS BEEN CONFIRMED, SUGGEST FOLLOW UP TESTING OF ANTITHROMBIN III LEVELS.   Type and screen [place]     Status: None   Collection Time: 01/08/17  1:01 PM  Result Value Ref Range   ABO/RH(D) A POS    Antibody Screen NEG    Sample Expiration 01/11/2017    Unit Number [id number]    Blood Component Type RED CELLS,LR    Unit division 00    Status of Unit ISSUED,FINAL    Transfusion Status OK TO TRANSFUSE    Crossmatch Result Compatible    Unit Number [id number]    Blood Component Type RED CELLS,LR    Unit division 00    Status of Unit ISSUED,FINAL    Transfusion Status OK TO TRANSFUSE    Crossmatch Result Compatible   Prepare RBC     Status: None   Collection Time: 01/08/17  1:38 PM  Result Value Ref Range   Order Confirmation ORDER PROCESSED BY BLOOD BANK   Hepatitis B surface antigen     Status: None   Collection Time: 01/08/17  1:44 PM  Result Value Ref Range   Hepatitis B Surface Ag Negative Negative    Comment: (NOTE) Performed At: [facility] [place], [place] [id number] Lindon Romp MD [id number]   Hepatitis B e antigen     Status: None   Collection Time: 01/08/17  1:45 PM  Result Value Ref Range    Hep B E Ag Negative Negative    Comment: (NOTE) Performed At: [facility] [place], [place] [id number] Lindon Romp MD [id number]   Hepatitis B core antibody, total     Status: None   Collection Time: 01/08/17  1:45 PM  Result Value Ref Range   Hep B Core Total Ab Negative Negative    Comment: (NOTE) Performed At: [facility] [place], [place] [id number] Lindon Romp MD [id number]   Comprehensive metabolic panel     Status: Abnormal   Collection Time: 01/08/17  9:42 PM  Result Value Ref Range   Sodium 132 (L) 135 - 145 mmol/L   Potassium 3.2 (L) 3.5 - 5.1 mmol/L    Comment: DELTA CHECK NOTED   Chloride 96 (L) 101 - 111 mmol/L   CO2 27 22 - 32 mmol/L   Glucose, Bld 99 65 - 99 mg/dL   BUN 10 6 - 20 mg/dL   Creatinine, Ser 2.58 (H) 0.44 - 1.00 mg/dL    Comment: DELTA CHECK NOTED   Calcium 8.3 (L) 8.9 - 10.3 mg/dL   Total Protein 5.8 (L) 6.5 - 8.1 g/dL   Albumin 2.7 (L) 3.5 - 5.0 g/dL   AST 22 15 - 41 U/L   ALT 17 14 - 54 U/L   Alkaline Phosphatase 64 38 - 126 U/L   Total Bilirubin 2.0 (H) 0.3 - 1.2 mg/dL   GFR calc non Af Amer 19 (L) >60 mL/min   GFR calc Af Amer 22 (L) >60 mL/min    Comment: (NOTE) The eGFR has been calculated using the CKD EPI equation. This calculation has not been validated in all clinical situations. eGFR's persistently <60 mL/min signify possible Chronic Kidney Disease.    Anion gap 9 5 - 15  Magnesium  Status: None   Collection Time: 01/08/17  9:42 PM  Result Value Ref Range   Magnesium 1.9 1.7 - 2.4 mg/dL  .Cooxemetry Panel (carboxy, met, total hgb, O2 sat)     Status: Abnormal   Collection Time: 01/08/17 10:10 PM  Result Value Ref Range   Total hemoglobin 7.3 (L) 12.0 - 16.0 g/dL   O2 Saturation 47.5 %   Carboxyhemoglobin 2.9 (H) 0.5 - 1.5 %   Methemoglobin 0.8 0.0 - 1.5 %  Blood gas, arterial     Status: Abnormal   Collection Time: 01/08/17 11:05 PM  Result Value Ref Range    FIO2 100.00    Delivery systems NON-REBREATHER OXYGEN MASK    pH, Arterial 7.440 7.350 - 7.450   pCO2 arterial 41.2 32.0 - 48.0 mmHg   pO2, Arterial 51.7 (L) 83.0 - 108.0 mmHg   Bicarbonate 27.5 20.0 - 28.0 mmol/L   Acid-Base Excess 3.6 (H) 0.0 - 2.0 mmol/L   O2 Saturation 88.6 %   Patient temperature 98.6    Collection site LEFT BRACHIAL    Drawn by [id number]    Sample type ARTERIAL DRAW    Allens test (pass/fail) PASS PASS  Culture, blood (Routine X 2) w Reflex to ID Panel     Status: None (Preliminary result)   Collection Time: 01/09/17 12:32 AM  Result Value Ref Range   Specimen Description BLOOD RIGHT HAND    Special Requests      IN PEDIATRIC BOTTLE Blood Culture adequate volume 3CC   Culture  Setup Time      GRAM POSITIVE COCCI IN PAIRS IN CHAINS IN PEDIATRIC BOTTLE Organism ID to follow CRITICAL RESULT CALLED TO, READ BACK BY AND VERIFIED WITH: A. JOHNSTON, PHARM, 01/09/17 AT 1733 BY J FUDESCO    Culture PENDING    Report Status PENDING   Blood Culture ID Panel (Reflexed)     Status: Abnormal   Collection Time: 01/09/17 12:32 AM  Result Value Ref Range   Enterococcus species DETECTED (A) NOT DETECTED    Comment: CRITICAL RESULT CALLED TO, READ BACK BY AND VERIFIED WITH: A. JOHNSTON, PHARM, 01/09/17, AT 1833 BY J FUDESCO    Vancomycin resistance DETECTED (A) NOT DETECTED    Comment: CRITICAL RESULT CALLED TO, READ BACK BY AND VERIFIED WITH: A JOHNSTON, PHARM, 01/09/17 AT 1833 BY J FUDESCO    Listeria monocytogenes NOT DETECTED NOT DETECTED   Staphylococcus species NOT DETECTED NOT DETECTED   Staphylococcus aureus NOT DETECTED NOT DETECTED   Streptococcus species NOT DETECTED NOT DETECTED   Streptococcus agalactiae NOT DETECTED NOT DETECTED   Streptococcus pneumoniae NOT DETECTED NOT DETECTED   Streptococcus pyogenes NOT DETECTED NOT DETECTED   Acinetobacter baumannii NOT DETECTED NOT DETECTED   Enterobacteriaceae species NOT DETECTED NOT DETECTED   Enterobacter cloacae  complex NOT DETECTED NOT DETECTED   Escherichia coli NOT DETECTED NOT DETECTED   Klebsiella oxytoca NOT DETECTED NOT DETECTED   Klebsiella pneumoniae NOT DETECTED NOT DETECTED   Proteus species NOT DETECTED NOT DETECTED   Serratia marcescens NOT DETECTED NOT DETECTED   Haemophilus influenzae NOT DETECTED NOT DETECTED   Neisseria meningitidis NOT DETECTED NOT DETECTED   Pseudomonas aeruginosa NOT DETECTED NOT DETECTED   Candida albicans NOT DETECTED NOT DETECTED   Candida glabrata NOT DETECTED NOT DETECTED   Candida krusei NOT DETECTED NOT DETECTED   Candida parapsilosis NOT DETECTED NOT DETECTED   Candida tropicalis NOT DETECTED NOT DETECTED  Culture, blood (Routine X 2) w Reflex to ID Panel  Status: None (Preliminary result)   Collection Time: 01/09/17 12:41 AM  Result Value Ref Range   Specimen Description BLOOD LEFT HAND    Special Requests      IN PEDIATRIC BOTTLE Blood Culture adequate volume 3CC   Culture  Setup Time      GRAM POSITIVE COCCI IN CHAINS IN PAIRS CRITICAL VALUE NOTED.  VALUE IS CONSISTENT WITH PREVIOUSLY REPORTED AND CALLED VALUE.    Culture PENDING    Report Status PENDING   Cooxemetry Panel (carboxy, met, total hgb, O2 sat)     Status: Abnormal   Collection Time: 01/09/17  5:00 AM  Result Value Ref Range   Total hemoglobin 6.2 (LL) 12.0 - 16.0 g/dL    Comment: CRITICAL RESULT CALLED TO, READ BACK BY AND VERIFIED WITH:  JOSEPH MEEKS, RRT @0515  BY MAURICE SMITH, RRT ON 01/09/2017    O2 Saturation 83.3 %   Carboxyhemoglobin 2.4 (H) 0.5 - 1.5 %   Methemoglobin 1.0 0.0 - 1.5 %  Heparin level (unfractionated)     Status: None   Collection Time: 01/09/17  5:00 AM  Result Value Ref Range   Heparin Unfractionated 0.40 0.30 - 0.70 IU/mL    Comment:        IF HEPARIN RESULTS ARE BELOW EXPECTED VALUES, AND PATIENT DOSAGE HAS BEEN CONFIRMED, SUGGEST FOLLOW UP TESTING OF ANTITHROMBIN III LEVELS.   Renal function panel     Status: Abnormal   Collection Time:  01/09/17  5:00 AM  Result Value Ref Range   Sodium 133 (L) 135 - 145 mmol/L   Potassium 3.2 (L) 3.5 - 5.1 mmol/L   Chloride 95 (L) 101 - 111 mmol/L   CO2 27 22 - 32 mmol/L   Glucose, Bld 89 65 - 99 mg/dL   BUN 12 6 - 20 mg/dL   Creatinine, Ser 2.95 (H) 0.44 - 1.00 mg/dL   Calcium 8.4 (L) 8.9 - 10.3 mg/dL   Phosphorus 1.9 (L) 2.5 - 4.6 mg/dL   Albumin 2.6 (L) 3.5 - 5.0 g/dL   GFR calc non Af Amer 16 (L) >60 mL/min   GFR calc Af Amer 19 (L) >60 mL/min    Comment: (NOTE) The eGFR has been calculated using the CKD EPI equation. This calculation has not been validated in all clinical situations. eGFR's persistently <60 mL/min signify possible Chronic Kidney Disease.    Anion gap 11 5 - 15  CBC     Status: Abnormal   Collection Time: 01/09/17  5:00 AM  Result Value Ref Range   WBC 7.3 4.0 - 10.5 K/uL   RBC 2.69 (L) 3.87 - 5.11 MIL/uL   Hemoglobin 7.1 (L) 12.0 - 15.0 g/dL   HCT 23.9 (L) 36.0 - 46.0 %   MCV 88.8 78.0 - 100.0 fL   MCH 26.4 26.0 - 34.0 pg   MCHC 29.7 (L) 30.0 - 36.0 g/dL   RDW 26.4 (H) 11.5 - 15.5 %   Platelets 146 (L) 150 - 400 K/uL    Comment: PLATELET COUNT CONFIRMED BY SMEAR  .Cooxemetry Panel (carboxy, met, total hgb, O2 sat)     Status: Abnormal   Collection Time: 01/09/17  5:30 AM  Result Value Ref Range   Total hemoglobin 7.4 (L) 12.0 - 16.0 g/dL   O2 Saturation 70.0 %   Carboxyhemoglobin 2.6 (H) 0.5 - 1.5 %   Methemoglobin 0.9 0.0 - 1.5 %  Prepare RBC     Status: None   Collection Time: 01/09/17  9:00 AM  Result Value Ref Range   Order Confirmation ORDER PROCESSED BY BLOOD BANK   Cortisol     Status: None   Collection Time: 01/09/17  1:56 PM  Result Value Ref Range   Cortisol, Plasma 14.5 ug/dL    Comment: (NOTE) AM    6.7 - 22.6 ug/dL PM   <10.0       ug/dL   Renal function panel (daily at 1600)     Status: Abnormal   Collection Time: 01/09/17  4:13 PM  Result Value Ref Range   Sodium 134 (L) 135 - 145 mmol/L   Potassium 3.7 3.5 - 5.1 mmol/L     Chloride 100 (L) 101 - 111 mmol/L   CO2 25 22 - 32 mmol/L   Glucose, Bld 128 (H) 65 - 99 mg/dL   BUN 15 6 - 20 mg/dL   Creatinine, Ser 3.05 (H) 0.44 - 1.00 mg/dL   Calcium 8.6 (L) 8.9 - 10.3 mg/dL   Phosphorus 2.0 (L) 2.5 - 4.6 mg/dL   Albumin 2.7 (L) 3.5 - 5.0 g/dL   GFR calc non Af Amer 16 (L) >60 mL/min   GFR calc Af Amer 18 (L) >60 mL/min    Comment: (NOTE) The eGFR has been calculated using the CKD EPI equation. This calculation has not been validated in all clinical situations. eGFR's persistently <60 mL/min signify possible Chronic Kidney Disease.    Anion gap 9 5 - 15  Cooxemetry Panel (carboxy, met, total hgb, O2 sat)     Status: Abnormal   Collection Time: 01/10/17  4:50 AM  Result Value Ref Range   Total hemoglobin 8.1 (L) 12.0 - 16.0 g/dL   O2 Saturation 72.8 %   Carboxyhemoglobin 2.3 (H) 0.5 - 1.5 %   Methemoglobin 1.1 0.0 - 1.5 %  Heparin level (unfractionated)     Status: None   Collection Time: 01/10/17  5:15 AM  Result Value Ref Range   Heparin Unfractionated 0.66 0.30 - 0.70 IU/mL    Comment:        IF HEPARIN RESULTS ARE BELOW EXPECTED VALUES, AND PATIENT DOSAGE HAS BEEN CONFIRMED, SUGGEST FOLLOW UP TESTING OF ANTITHROMBIN III LEVELS.   CBC     Status: Abnormal   Collection Time: 01/10/17  5:15 AM  Result Value Ref Range   WBC 7.4 4.0 - 10.5 K/uL   RBC 3.19 (L) 3.87 - 5.11 MIL/uL   Hemoglobin 8.4 (L) 12.0 - 15.0 g/dL   HCT 28.1 (L) 36.0 - 46.0 %   MCV 88.1 78.0 - 100.0 fL   MCH 26.3 26.0 - 34.0 pg   MCHC 29.9 (L) 30.0 - 36.0 g/dL   RDW 26.0 (H) 11.5 - 15.5 %   Platelets 149 (L) 150 - 400 K/uL  Magnesium     Status: None   Collection Time: 01/10/17  5:15 AM  Result Value Ref Range   Magnesium 2.1 1.7 - 2.4 mg/dL  CK     Status: Abnormal   Collection Time: 01/10/17  5:15 AM  Result Value Ref Range   Total CK 27 (L) 38 - 234 U/L  Renal function panel     Status: Abnormal   Collection Time: 01/10/17  5:15 AM  Result Value Ref Range   Sodium  134 (L) 135 - 145 mmol/L   Potassium 3.5 3.5 - 5.1 mmol/L   Chloride 99 (L) 101 - 111 mmol/L   CO2 26 22 - 32 mmol/L   Glucose, Bld 105 (H) 65 - 99 mg/dL  BUN 10 6 - 20 mg/dL   Creatinine, Ser 2.39 (H) 0.44 - 1.00 mg/dL   Calcium 8.7 (L) 8.9 - 10.3 mg/dL   Phosphorus 1.6 (L) 2.5 - 4.6 mg/dL   Albumin 2.6 (L) 3.5 - 5.0 g/dL   GFR calc non Af Amer 21 (L) >60 mL/min   GFR calc Af Amer 25 (L) >60 mL/min    Comment: (NOTE) The eGFR has been calculated using the CKD EPI equation. This calculation has not been validated in all clinical situations. eGFR's persistently <60 mL/min signify possible Chronic Kidney Disease.    Anion gap 9 5 - 15   @BRIEFLABTABLE (sdes,specrequest,cult,reptstatus)   ) Recent Results (from the past 720 hour(s))  MRSA PCR Screening     Status: None   Collection Time: 12/27/16  3:22 PM  Result Value Ref Range Status   MRSA by PCR NEGATIVE NEGATIVE Final    Comment:        The GeneXpert MRSA Assay (FDA approved for NASAL specimens only), is one component of a comprehensive MRSA colonization surveillance program. It is not intended to diagnose MRSA infection nor to guide or monitor treatment for MRSA infections.   Culture, blood (Routine X 2) w Reflex to ID Panel     Status: None (Preliminary result)   Collection Time: 01/09/17 12:32 AM  Result Value Ref Range Status   Specimen Description BLOOD RIGHT HAND  Final   Special Requests   Final    IN PEDIATRIC BOTTLE Blood Culture adequate volume 3CC   Culture  Setup Time   Final    GRAM POSITIVE COCCI IN PAIRS IN CHAINS IN PEDIATRIC BOTTLE Organism ID to follow CRITICAL RESULT CALLED TO, READ BACK BY AND VERIFIED WITH: A. JOHNSTON, PHARM, 01/09/17 AT 1733 BY J FUDESCO    Culture PENDING  Incomplete   Report Status PENDING  Incomplete  Blood Culture ID Panel (Reflexed)     Status: Abnormal   Collection Time: 01/09/17 12:32 AM  Result Value Ref Range Status   Enterococcus species DETECTED (A) NOT  DETECTED Final    Comment: CRITICAL RESULT CALLED TO, READ BACK BY AND VERIFIED WITH: A. JOHNSTON, PHARM, 01/09/17, AT 1833 BY J FUDESCO    Vancomycin resistance DETECTED (A) NOT DETECTED Final    Comment: CRITICAL RESULT CALLED TO, READ BACK BY AND VERIFIED WITH: A JOHNSTON, PHARM, 01/09/17 AT 1833 BY J FUDESCO    Listeria monocytogenes NOT DETECTED NOT DETECTED Final   Staphylococcus species NOT DETECTED NOT DETECTED Final   Staphylococcus aureus NOT DETECTED NOT DETECTED Final   Streptococcus species NOT DETECTED NOT DETECTED Final   Streptococcus agalactiae NOT DETECTED NOT DETECTED Final   Streptococcus pneumoniae NOT DETECTED NOT DETECTED Final   Streptococcus pyogenes NOT DETECTED NOT DETECTED Final   Acinetobacter baumannii NOT DETECTED NOT DETECTED Final   Enterobacteriaceae species NOT DETECTED NOT DETECTED Final   Enterobacter cloacae complex NOT DETECTED NOT DETECTED Final   Escherichia coli NOT DETECTED NOT DETECTED Final   Klebsiella oxytoca NOT DETECTED NOT DETECTED Final   Klebsiella pneumoniae NOT DETECTED NOT DETECTED Final   Proteus species NOT DETECTED NOT DETECTED Final   Serratia marcescens NOT DETECTED NOT DETECTED Final   Haemophilus influenzae NOT DETECTED NOT DETECTED Final   Neisseria meningitidis NOT DETECTED NOT DETECTED Final   Pseudomonas aeruginosa NOT DETECTED NOT DETECTED Final   Candida albicans NOT DETECTED NOT DETECTED Final   Candida glabrata NOT DETECTED NOT DETECTED Final   Candida krusei NOT DETECTED NOT DETECTED  Final   Candida parapsilosis NOT DETECTED NOT DETECTED Final   Candida tropicalis NOT DETECTED NOT DETECTED Final  Culture, blood (Routine X 2) w Reflex to ID Panel     Status: None (Preliminary result)   Collection Time: 01/09/17 12:41 AM  Result Value Ref Range Status   Specimen Description BLOOD LEFT HAND  Final   Special Requests   Final    IN PEDIATRIC BOTTLE Blood Culture adequate volume 3CC   Culture  Setup Time   Final     GRAM POSITIVE COCCI IN CHAINS IN PAIRS CRITICAL VALUE NOTED.  VALUE IS CONSISTENT WITH PREVIOUSLY REPORTED AND CALLED VALUE.    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)   Sepsis ([place])   Paige Marshall is a 61 y.o. female with  admission with severe heart failure and cardiorenal syndrome requiring hemodialysis currently on CVVHD, with several lines in place who became febrile and with evidence of sepsis now found to be bacteremic with VRE  --continue aggressive dose of daptomycin (there is a chance that this still could be an episode sensitive organism though I doubt it and she also has fairly severe penicillin allergy by her history ) --Repeat blood cultures --Removed central lines that can be removed though currently clearly on CVVHD I dont think she is someone who is going to be able to have a "catheter holiday" any time soon --check TTE again --clearly she is NOT a candidate for a device from EP at present  Her prognosis appears very poor   01/10/2017, 12:22 PM   Thank you so much for this interesting consult  [place] [facility] [place] [phone] (pager) [phone] (office) 01/10/2017, 12:22 PM  Rhina [place] 01/10/2017, 12:22 PM

## 2017-01-11 ENCOUNTER — Inpatient Hospital Stay (HOSPITAL_COMMUNITY): Payer: Medicare Other

## 2017-01-11 DIAGNOSIS — R7881 Bacteremia: Secondary | ICD-10-CM

## 2017-01-11 LAB — HEPARIN LEVEL (UNFRACTIONATED): Heparin Unfractionated: 0.52 IU/mL (ref 0.30–0.70)

## 2017-01-11 LAB — ECHOCARDIOGRAM COMPLETE
Height: 67.5 in
Weight: 4864 oz

## 2017-01-11 LAB — CULTURE, BLOOD (ROUTINE X 2)
SPECIAL REQUESTS: ADEQUATE
SPECIAL REQUESTS: ADEQUATE

## 2017-01-11 LAB — RENAL FUNCTION PANEL
ALBUMIN: 2.6 g/dL — AB (ref 3.5–5.0)
Anion gap: 9 (ref 5–15)
BUN: 8 mg/dL (ref 6–20)
CALCIUM: 8.7 mg/dL — AB (ref 8.9–10.3)
CO2: 27 mmol/L (ref 22–32)
CREATININE: 1.9 mg/dL — AB (ref 0.44–1.00)
Chloride: 99 mmol/L — ABNORMAL LOW (ref 101–111)
GFR, EST AFRICAN AMERICAN: 32 mL/min — AB (ref >60)
GFR, EST NON AFRICAN AMERICAN: 28 mL/min — AB (ref >60)
Glucose, Bld: 86 mg/dL (ref 65–99)
PHOSPHORUS: 1.6 mg/dL — AB (ref 2.5–4.6)
Potassium: 4.9 mmol/L (ref 3.5–5.1)
Sodium: 135 mmol/L (ref 135–145)

## 2017-01-11 LAB — COOXEMETRY PANEL
Carboxyhemoglobin: 1.8 % — ABNORMAL HIGH (ref 0.5–1.5)
METHEMOGLOBIN: 1 % (ref 0.0–1.5)
O2 Saturation: 56.7 %
TOTAL HEMOGLOBIN: 9.3 g/dL — AB (ref 12.0–16.0)

## 2017-01-11 LAB — MAGNESIUM: Magnesium: 2.3 mg/dL (ref 1.7–2.4)

## 2017-01-11 LAB — CBC
HCT: 27.2 % — ABNORMAL LOW (ref 36.0–46.0)
Hemoglobin: 8.3 g/dL — ABNORMAL LOW (ref 12.0–15.0)
MCH: 27.1 pg (ref 26.0–34.0)
MCHC: 30.5 g/dL (ref 30.0–36.0)
MCV: 88.9 fL (ref 78.0–100.0)
Platelets: 149 10*3/uL — ABNORMAL LOW (ref 150–400)
RBC: 3.06 MIL/uL — ABNORMAL LOW (ref 3.87–5.11)
RDW: 25.9 % — ABNORMAL HIGH (ref 11.5–15.5)
WBC: 5.1 10*3/uL (ref 4.0–10.5)

## 2017-01-11 MED ORDER — SODIUM CHLORIDE 0.9% FLUSH: 3.0000 mL | INTRAVENOUS | Status: DC | PRN

## 2017-01-11 MED ORDER — SODIUM CHLORIDE 0.9 % IV SOLN
2.0000 g | Freq: Three times a day (TID) | INTRAVENOUS | Status: DC
  Administered 2017-01-11 – 2017-01-17 (×18): 2 g via INTRAVENOUS
  Filled 2017-01-11 (×20): qty 2000

## 2017-01-11 MED ORDER — DIPHENHYDRAMINE-ZINC ACETATE 2-0.1 % EX CREA
TOPICAL_CREAM | Freq: Every day | CUTANEOUS | Status: DC | PRN
  Administered 2017-01-11 – 2017-01-13 (×3): via TOPICAL
  Administered 2017-01-14 – 2017-01-17 (×3): 1 via TOPICAL
  Filled 2017-01-11 (×3): qty 28

## 2017-01-11 MED ORDER — PERFLUTREN LIPID MICROSPHERE
INTRAVENOUS | Status: AC
  Filled 2017-01-11: qty 10

## 2017-01-11 MED ORDER — PERFLUTREN LIPID MICROSPHERE
1.0000 mL | INTRAVENOUS | Status: AC | PRN
  Administered 2017-01-11: 3 mL via INTRAVENOUS
  Filled 2017-01-11: qty 10

## 2017-01-11 MED ORDER — DEXTROSE 5 % IV SOLN
30.0000 mmol | Freq: Once | INTRAVENOUS | Status: AC
  Administered 2017-01-11: 30 mmol via INTRAVENOUS
  Filled 2017-01-11: qty 10

## 2017-01-11 MED ORDER — SODIUM CHLORIDE 0.9 % IV SOLN: 250.0000 mL | INTRAVENOUS | Status: DC

## 2017-01-11 MED ORDER — SODIUM CHLORIDE 0.9% FLUSH
3.0000 mL | Freq: Two times a day (BID) | INTRAVENOUS | Status: DC
  Administered 2017-01-12 – 2017-01-13 (×2): 3 mL via INTRAVENOUS

## 2017-01-11 NOTE — Progress Notes (Signed)
qPhysical Therapy Treatment Patient Details Name: Paige Marshall MRN: [id number] DOB: Dec 04, 1955 Today's Date: 01/11/2017    History of Present Illness 61 y.o. female with morbid obesity / wheelchair bound, untreated OHS, chronic combined systolic / diastolic CHF, NICM, AF on coumadin, HTN, 3L O2 dependent, pulmonary hypertension, COPD & CKD III.  The patient suffered a left fibular fracture in late 2017 and has been at [facility] since. Admitted 12/26/16 from SNF with acute CHF. Pt started on CVVHD that was then discontinued for HD. 3/31 pt developed septic shock and CVVHD restarted.    PT Comments    Pt motivated to participate and performed ex's. As medical stability improves will move toward OOB with lift.   Follow Up Recommendations  SNF;Supervision/Assistance - 24 hour     Equipment Recommendations  None recommended by PT    Recommendations for Other Services       Precautions / Restrictions Precautions Precautions: Fall;Other (comment) Precaution Comments: watch vitals Restrictions Weight Bearing Restrictions: No    Mobility  Bed Mobility               General bed mobility comments: not addressed  Transfers                 General transfer comment: not stable enough to attempt  Ambulation/Gait                 Stairs            Wheelchair Mobility    Modified Rankin (Stroke Patients Only)       Balance                                            Cognition Arousal/Alertness: Awake/alert Behavior During Therapy: WFL for tasks assessed/performed Overall Cognitive Status: Within Functional Limits for tasks assessed                                        Exercises General Exercises - Upper Extremity Shoulder Flexion: AROM;Supine;Left;15 reps Shoulder Horizontal ABduction: AROM;Supine;Left;15 reps Shoulder Horizontal ADduction: AROM;Left;15 reps;Supine Elbow Flexion: AROM;Both;15  reps;Supine Elbow Extension: AROM;Both;15 reps;Supine General Exercises - Lower Extremity Ankle Circles/Pumps: AROM;20 reps;Both;Supine Quad Sets: AROM;Both;Supine;15 reps Heel Slides: AAROM;Supine;Both;15 reps Hip ABduction/ADduction: AAROM;Supine;Both;15 reps    General Comments        Pertinent Vitals/Pain Pain Assessment: No/denies pain    Home Living                      Prior Function            PT Goals (current goals can now be found in the care plan section) Progress towards PT goals: Not progressing toward goals - comment (medical issues)    Frequency    Min 2X/week      PT Plan Current plan remains appropriate;Frequency needs to be updated    Co-evaluation             End of Session Equipment Utilized During Treatment: Oxygen Activity Tolerance: Treatment limited secondary to medical complications (Comment) (Pt on pressors and CVVHD) Patient left: in bed;with call bell/phone within reach Nurse Communication: Mobility status;Need for lift equipment PT Visit Diagnosis: Muscle weakness (generalized) (M62.81);Difficulty in walking, not elsewhere classified (R26.2)     Time: 3716-9678  PT Time Calculation (min) (ACUTE ONLY): 14 min  Charges:  $Therapeutic Exercise: 8-22 mins                    G Codes:       [facility] PT [place] 01/11/2017, 12:31 PM

## 2017-01-11 NOTE — Progress Notes (Signed)
ANTICOAGULATION CONSULT NOTE - Follow Up Consult  Pharmacy Consult for Heparin  Indication: atrial fibrillation  Allergies  Allergen Reactions  . Penicillins Hives, Itching, Swelling and Other (See Comments)    Tongue swelling Has patient had a PCN reaction causing immediate rash, facial/tongue/throat swelling, SOB or lightheadedness with hypotension: Yes  Clarified with the patient her penicillin allergy. Pt has tolerated cefepime in the past. Pt also states that she can take amoxicillin.    . Citrus Hives   Patient Measurements: Height: 5' 7.5" (171.5 cm) Weight: (!) 304 lb (137.9 kg) (verified twice) IBW/kg (Calculated) : 62.75  Vital Signs: Temp: 97.8 F (36.6 C) (04/03 0400) Temp Source: Oral (04/03 0000) Pulse Rate: 129 (04/03 0800)  Labs:  Recent Labs  01/09/17 0500  01/10/17 0515 01/10/17 1542 01/11/17 0529  HGB 7.1*  --  8.4*  --  8.3*  HCT 23.9*  --  28.1*  --  27.2*  PLT 146*  --  149*  --  149*  HEPARINUNFRC 0.40  --  0.66  --  0.52  CREATININE 2.95*  < > 2.39* 2.24* 1.90*  CKTOTAL  --   --  27*  --   --   < > = values in this interval not displayed.  Estimated Creatinine Clearance: 47.3 mL/min (A) (by C-G formula based on SCr of 1.9 mg/dL (H)).  Assessment: 60yof on coumadin pta for afib, admitted with volume overload and renal failure. INR 2.84 on admit and coumadin initially resumed (received one dose of 5mg ). On 3/20, coumadin held, and she received 5mg  vitamin k to reverse INR w/need for CRRT. Heparin bridge started.  Heparin resumed s/p tunneled HD cath placement 3/29. Heparin at goal this am. Hgb stable 8.3.  Goal of Therapy:  Heparin level 0.3-0.7 units/ml Monitor platelets by anticoagulation protocol: Yes   Plan:  Continue heparin gtt at 3400 unit/hr Daily heparin level/CBC Monitor for s/sx of bleeding Follow up when able to resume coumadin  Erin Hearing PharmD., BCPS Clinical Pharmacist Pager [phone] 01/11/2017 8:22 AM

## 2017-01-11 NOTE — Progress Notes (Signed)
Subjective: Feels better   Antibiotics:  Anti-infectives    Start     Dose/Rate Route Frequency Ordered Stop   01/11/17 2000  DAPTOmycin (CUBICIN) 1,000 mg in sodium chloride 0.9 % IVPB  Status:  Discontinued     1,000 mg 240 mL/hr over 30 Minutes Intravenous  Once 01/10/17 1249 01/11/17 1000   01/11/17 1600  ampicillin (OMNIPEN) 2 g in sodium chloride 0.9 % 50 mL IVPB     2 g 150 mL/hr over 20 Minutes Intravenous Every 8 hours 01/11/17 1000     01/09/17 2200  ceFEPIme (MAXIPIME) 1 g in dextrose 5 % 50 mL IVPB  Status:  Discontinued     1 g 100 mL/hr over 30 Minutes Intravenous Every 24 hours 01/09/17 0033 01/09/17 1118   01/09/17 2200  ceFEPIme (MAXIPIME) 2 g in dextrose 5 % 50 mL IVPB  Status:  Discontinued     2 g 100 mL/hr over 30 Minutes Intravenous Every 24 hours 01/09/17 1118 01/09/17 1130   01/09/17 2200  ceFEPIme (MAXIPIME) 2 g in dextrose 5 % 50 mL IVPB  Status:  Discontinued     2 g 100 mL/hr over 30 Minutes Intravenous Every 12 hours 01/09/17 1130 01/09/17 1914   01/09/17 1900  DAPTOmycin (CUBICIN) 1,000 mg in sodium chloride 0.9 % IVPB     1,000 mg 240 mL/hr over 30 Minutes Intravenous  Once 01/09/17 1852 01/09/17 2013   01/09/17 0045  vancomycin (VANCOCIN) 2,000 mg in sodium chloride 0.9 % 500 mL IVPB     2,000 mg 250 mL/hr over 120 Minutes Intravenous  Once 01/09/17 0033 01/09/17 0318   01/09/17 0045  ceFEPIme (MAXIPIME) 2 g in dextrose 5 % 50 mL IVPB     2 g 100 mL/hr over 30 Minutes Intravenous  Once 01/09/17 0033 01/09/17 0149   01/06/17 0545  vancomycin (VANCOCIN) 1,500 mg in sodium chloride 0.9 % 500 mL IVPB     1,500 mg 250 mL/hr over 120 Minutes Intravenous To Radiology 01/06/17 0533 01/06/17 1805      Medications: Scheduled Meds: . ampicillin (OMNIPEN) IV  2 g Intravenous Q8H  . bisacodyl  10 mg Oral Daily  . Chlorhexidine Gluconate Cloth  6 each Topical Daily  . darbepoetin (ARANESP) injection - NON-DIALYSIS  150 mcg Subcutaneous Q  Tue-1800  . mouth rinse  15 mL Mouth Rinse BID  . potassium chloride  30 mEq Oral Daily  . senna-docusate  2 tablet Oral QHS  . sodium phosphate  Dextrose 5% IVPB  30 mmol Intravenous Once   Continuous Infusions: . amiodarone 30 mg/hr (01/11/17 0029)  . heparin 3,400 Units/hr (01/11/17 0220)  . norepinephrine (LEVOPHED) Adult infusion 4 mcg/min (01/10/17 1950)  . dialysis replacement fluid (prismasate) 300 mL/hr at 01/10/17 2310  . dialysis replacement fluid (prismasate) 300 mL/hr at 01/10/17 2309  . dialysate (PRISMASATE) 1,500 mL/hr at 01/11/17 0912   PRN Meds:.Place/Maintain arterial line **AND** sodium chloride, Place/Maintain arterial line **AND** sodium chloride, acetaminophen, ALPRAZolam, diphenhydrAMINE-zinc acetate, heparin, heparin, ondansetron (ZOFRAN) IV, oxyCODONE, polyethylene glycol, promethazine, sodium chloride, sorbitol    Objective: Weight change: -19 lb (-8.618 kg)  Intake/Output Summary (Last 24 hours) at 01/11/17 1029 Last data filed at 01/11/17 1000  Gross per 24 hour  Intake           1743.3 ml  Output             4612 ml  Net          -  2868.7 ml   Blood pressure (!) 110/58, pulse (!) 128, temperature 97.5 F (36.4 C), temperature source Oral, resp. rate 20, height 5' 7.5" (1.715 m), weight (!) 304 lb (137.9 kg), last menstrual period 09/07/2011, SpO2 93 %. Temp:  [97.5 F (36.4 C)-98.5 F (36.9 C)] 97.5 F (36.4 C) (04/03 0800) Pulse Rate:  [97-150] 128 (04/03 1000) Resp:  [15-27] 20 (04/03 1000) SpO2:  [89 %-100 %] 93 % (04/03 1000) Arterial Line BP: (103-144)/(54-77) 121/63 (04/03 1000) FiO2 (%):  [50 %] 50 % (04/02 1200) Weight:  [304 lb (137.9 kg)] 304 lb (137.9 kg) (04/03 0600)  Physical Exam: General: Alert and awake, oriented x3, not in any acute distress. HEENT: anicteric sclera, pupils reactive to light and accommodation, EOMI CVS regular rate, normal r,  no murmur rubs or gallops Chest: farily clear to auscultation bilaterally, no  wheezing, rales or rhonchi Abdomen: soft nontender, distended, normal bowel sounds, Extremities: 2+ edema Skin: no rashes  Neuro: nonfocal  CBC:  CBC Latest Ref Rng & Units 01/11/2017 01/10/2017 01/09/2017  WBC 4.0 - 10.5 K/uL 5.1 7.4 7.3  Hemoglobin 12.0 - 15.0 g/dL 8.3(L) 8.4(L) 7.1(L)  Hematocrit 36.0 - 46.0 % 27.2(L) 28.1(L) 23.9(L)  Platelets 150 - 400 K/uL 149(L) 149(L) 146(L)    BMET  Recent Labs  01/10/17 1542 01/11/17 0529  NA 136 135  K 3.9 4.9  CL 101 99*  CO2 26 27  GLUCOSE 122* 86  BUN 9 8  CREATININE 2.24* 1.90*  CALCIUM 8.4* 8.7*     Liver Panel   Recent Labs  01/08/17 2142  01/10/17 1542 01/11/17 0529  PROT 5.8*  --   --   --   ALBUMIN 2.7*  < > 2.6* 2.6*  AST 22  --   --   --   ALT 17  --   --   --   ALKPHOS 64  --   --   --   BILITOT 2.0*  --   --   --   < > = values in this interval not displayed.     Sedimentation Rate No results for input(s): ESRSEDRATE in the last 72 hours. C-Reactive Protein No results for input(s): CRP in the last 72 hours.  Micro Results: Recent Results (from the past 720 hour(s))  MRSA PCR Screening     Status: None   Collection Time: 12/27/16  3:22 PM  Result Value Ref Range Status   MRSA by PCR NEGATIVE NEGATIVE Final    Comment:        The GeneXpert MRSA Assay (FDA approved for NASAL specimens only), is one component of a comprehensive MRSA colonization surveillance program. It is not intended to diagnose MRSA infection nor to guide or monitor treatment for MRSA infections.   Culture, blood (Routine X 2) w Reflex to ID Panel     Status: Abnormal   Collection Time: 01/09/17 12:32 AM  Result Value Ref Range Status   Specimen Description BLOOD RIGHT HAND  Final   Special Requests   Final    IN PEDIATRIC BOTTLE Blood Culture adequate volume 3CC   Culture  Setup Time   Final    GRAM POSITIVE COCCI IN PAIRS IN CHAINS IN PEDIATRIC BOTTLE Organism ID to follow CRITICAL RESULT CALLED TO, READ BACK BY  AND VERIFIED WITH: A. JOHNSTON, PHARM, 01/09/17 AT 1733 BY J FUDESCO    Culture VANCOMYCIN RESISTANT ENTEROCOCCUS (A)  Final   Report Status 01/11/2017 FINAL  Final   Organism ID,  Bacteria VANCOMYCIN RESISTANT ENTEROCOCCUS  Final      Susceptibility   Vancomycin resistant enterococcus - MIC*    AMPICILLIN <=2 SENSITIVE Sensitive     VANCOMYCIN >=32 RESISTANT Resistant     GENTAMICIN SYNERGY SENSITIVE Sensitive     LINEZOLID 2 SENSITIVE Sensitive     * VANCOMYCIN RESISTANT ENTEROCOCCUS  Blood Culture ID Panel (Reflexed)     Status: Abnormal   Collection Time: 01/09/17 12:32 AM  Result Value Ref Range Status   Enterococcus species DETECTED (A) NOT DETECTED Final    Comment: CRITICAL RESULT CALLED TO, READ BACK BY AND VERIFIED WITH: A. JOHNSTON, PHARM, 01/09/17, AT 1833 BY J FUDESCO    Vancomycin resistance DETECTED (A) NOT DETECTED Final    Comment: CRITICAL RESULT CALLED TO, READ BACK BY AND VERIFIED WITH: A JOHNSTON, PHARM, 01/09/17 AT 1833 BY J FUDESCO    Listeria monocytogenes NOT DETECTED NOT DETECTED Final   Staphylococcus species NOT DETECTED NOT DETECTED Final   Staphylococcus aureus NOT DETECTED NOT DETECTED Final   Streptococcus species NOT DETECTED NOT DETECTED Final   Streptococcus agalactiae NOT DETECTED NOT DETECTED Final   Streptococcus pneumoniae NOT DETECTED NOT DETECTED Final   Streptococcus pyogenes NOT DETECTED NOT DETECTED Final   Acinetobacter baumannii NOT DETECTED NOT DETECTED Final   Enterobacteriaceae species NOT DETECTED NOT DETECTED Final   Enterobacter cloacae complex NOT DETECTED NOT DETECTED Final   Escherichia coli NOT DETECTED NOT DETECTED Final   Klebsiella oxytoca NOT DETECTED NOT DETECTED Final   Klebsiella pneumoniae NOT DETECTED NOT DETECTED Final   Proteus species NOT DETECTED NOT DETECTED Final   Serratia marcescens NOT DETECTED NOT DETECTED Final   Haemophilus influenzae NOT DETECTED NOT DETECTED Final   Neisseria meningitidis NOT DETECTED NOT  DETECTED Final   Pseudomonas aeruginosa NOT DETECTED NOT DETECTED Final   Candida albicans NOT DETECTED NOT DETECTED Final   Candida glabrata NOT DETECTED NOT DETECTED Final   Candida krusei NOT DETECTED NOT DETECTED Final   Candida parapsilosis NOT DETECTED NOT DETECTED Final   Candida tropicalis NOT DETECTED NOT DETECTED Final  Culture, blood (Routine X 2) w Reflex to ID Panel     Status: Abnormal   Collection Time: 01/09/17 12:41 AM  Result Value Ref Range Status   Specimen Description BLOOD LEFT HAND  Final   Special Requests   Final    IN PEDIATRIC BOTTLE Blood Culture adequate volume 3CC   Culture  Setup Time   Final    GRAM POSITIVE COCCI IN CHAINS IN PAIRS CRITICAL VALUE NOTED.  VALUE IS CONSISTENT WITH PREVIOUSLY REPORTED AND CALLED VALUE.    Culture (A)  Final    ENTEROCOCCUS FAECALIS SUSCEPTIBILITIES PERFORMED ON PREVIOUS CULTURE WITHIN THE LAST 5 DAYS.    Report Status 01/11/2017 FINAL  Final    Studies/Results: Dg Chest Port 1 View  Result Date: 01/10/2017 CLINICAL DATA:  Central line placement. EXAM: PORTABLE CHEST 1 VIEW COMPARISON:  01/09/2017 FINDINGS: The right IJ catheter is stable. There is also a small caliber right-sided PICC line which is stable. New left IJ center venous catheter tip is in the mid SVC with the tip against the lateral wall. No complicating features. Stable cardiac enlargement and diffuse pulmonary edema. No definite pleural effusions. IMPRESSION: Support apparatus in good position without complicating features. Persistent cardiac enlargement and pulmonary edema. Electronically Signed   By: Marijo Sanes M.D.   On: 01/10/2017 13:09      Assessment/Plan:  INTERVAL HISTORY: central line removed left side ,  new on placed on right side Enterococcus is S to AMP   Principal Problem:   VRE bacteremia Active Problems:   Essential hypertension   PAF (paroxysmal atrial fibrillation) (HCC)   Obstructive sleep apnea   Morbid obesity (HCC)    Obesity hypoventilation syndrome (HCC)   COPD mixed type (HCC)   Chronic respiratory failure with hypoxia (HCC)   Acute kidney injury superimposed on CKD ([place])   Acute on chronic combined systolic and diastolic CHF (congestive heart failure) (HCC)   Hyperkalemia   Thoracic aortic atherosclerosis (HCC)   Cardiorenal syndrome   Acute hypoxemic respiratory failure (HCC)   History of conscious sedation   Acute pulmonary edema (HCC)   Severe sepsis with septic shock ([place])   Hemodialysis catheter infection ([place])    Paige Marshall is a 61 y.o. female with  female with  admission with severe heart failure and cardiorenal syndrome requiring hemodialysis currently on CVVHD, with several lines in place who became febrile and with evidence of sepsis now found to be bacteremic with VRE that is SENSITIVE TO AMP  --change to IV AMP (she has tolerated amox in the past) --TEE would help [place] exclude endocarditis if safe to do --if she has endocarditis and tolerates the AMP would go with AMP and CTX --again a catheter holiday is something she will need to effect cure but not forseeable at this point in near future     LOS: 16 days   [employer] 01/11/2017, 10:29 AM

## 2017-01-11 NOTE — Progress Notes (Signed)
Subjective: Interval History: has no complaint .  Objective: Vital signs in last 24 hours: Temp:  [97.7 F (36.5 C)-98.5 F (36.9 C)] 97.8 F (36.6 C) (04/03 0400) Pulse Rate:  [97-150] 107 (04/03 0700) Resp:  [15-27] 15 (04/03 0700) BP: (96-110)/(58-60) 110/58 (04/02 0900) SpO2:  [73 %-100 %] 99 % (04/03 0700) Arterial Line BP: (103-144)/(54-77) 114/61 (04/03 0700) FiO2 (%):  [50 %] 50 % (04/02 1200) Weight:  [137.9 kg (304 lb)] 137.9 kg (304 lb) (04/03 0600) Weight change: -8.618 kg (-19 lb)  Intake/Output from previous day: 04/02 0701 - 04/03 0700 In: 1883.6 [P.O.:220; I.V.:1405.6; IV Piggyback:258] Out: 4533  Intake/Output this shift: No intake/output data recorded.  General appearance: alert, cooperative, no distress and morbidly obese Resp: diminished breath sounds bilaterally Chest wall: RIJ cath Cardio: irregularly irregular rhythm and S1, S2 normal GI: massive, pos bs, soft Extremitie3+edema 3+  Lab Results:  Recent Labs  01/10/17 0515 01/11/17 0529  WBC 7.4 5.1  HGB 8.4* 8.3*  HCT 28.1* 27.2*  PLT 149* 149*   BMET:  Recent Labs  01/10/17 1542 01/11/17 0529  NA 136 135  K 3.9 4.9  CL 101 99*  CO2 26 27  GLUCOSE 122* 86  BUN 9 8  CREATININE 2.24* 1.90*  CALCIUM 8.4* 8.7*   No results for input(s): PTH in the last 72 hours. Iron Studies: No results for input(s): IRON, TIBC, TRANSFERRIN, FERRITIN in the last 72 hours.  Studies/Results: Dg Chest Port 1 View  Result Date: 01/10/2017 CLINICAL DATA:  Central line placement. EXAM: PORTABLE CHEST 1 VIEW COMPARISON:  01/09/2017 FINDINGS: The right IJ catheter is stable. There is also a small caliber right-sided PICC line which is stable. New left IJ center venous catheter tip is in the mid SVC with the tip against the lateral wall. No complicating features. Stable cardiac enlargement and diffuse pulmonary edema. No definite pleural effusions. IMPRESSION: Support apparatus in good position without  complicating features. Persistent cardiac enlargement and pulmonary edema. Electronically Signed   By: Marijo Sanes M.D.   On: 01/10/2017 13:09    I have reviewed the patient's current medications.  Assessment/Plan: 1 AKI/CKD3-4 vol improving, tol CRRT,k K/acid/base/solute ok, Bp more stable 2 DM controlled 3 Massive obesity 4 Pulm HTN 5 Afib 6 CM 7 OSA  8 COPd 9 Anemia esa stable 10 VRE sepsis on Dapto P CRRT, cont AB, counsel, control rate   LOS: 16 days   Lona Six L 01/11/2017,7:49 AM

## 2017-01-11 NOTE — Progress Notes (Signed)
Advanced Heart Failure Rounding Note  PCP: EDWARDS, MICHELLE P, NP  Primary Cardiologist: Dr. Gwenlyn Found   Subjective:    Developed septic shock over the weekend with BPs in the 70s.  Now weaning norepi. Down to 3-4. SBP 110-115  Lines changed yesterday  Feels much better. More alert. No sob. Remains on CVVHD. Todays Co-ox is 57%. Respiratory status is better. Pending repeat echo.   Remains in AF 115-120 on IV amio   Blood CX 2/2=>VRE. ID following.    Objective:   Weight Range: (!) 137.9 kg (304 lb) Body mass index is 46.91 kg/m.   Vital Signs:   Temp:  [97.7 F (36.5 C)-98.5 F (36.9 C)] 97.8 F (36.6 C) (04/03 0400) Pulse Rate:  [97-150] 129 (04/03 0800) Resp:  [15-27] 22 (04/03 0800) BP: (110)/(58) 110/58 (04/02 0900) SpO2:  [73 %-100 %] 99 % (04/03 0800) Arterial Line BP: (103-144)/(54-77) 112/59 (04/03 0800) FiO2 (%):  [50 %] 50 % (04/02 1200) Weight:  [137.9 kg (304 lb)] 137.9 kg (304 lb) (04/03 0600) Last BM Date: 01/10/17  Weight change: Filed Weights   01/09/17 0500 01/10/17 0500 01/11/17 0600  Weight: (!) 143.8 kg (317 lb) (!) 146.5 kg (323 lb) (!) 137.9 kg (304 lb)    Intake/Output:   Intake/Output Summary (Last 24 hours) at 01/11/17 0820 Last data filed at 01/11/17 0800  Gross per 24 hour  Intake           1872.4 ml  Output             4648 ml  Net          -2775.6 ml     Physical Exam: General: Obese. luying in bed NAD HEENT: Normal anicteric Neck: Supple. JVP difficult to assess  Carotids 2+ bilat; no bruits RIJ tunneled cath. LIC TLC Cor: PMI non-palpable. IRR IRR no obvious murmur Lungs: Diminished throughout.  Clear. No wheezing Abdomen: Morbidly obese, NT, ND, no HSM. No bruits or masses. Good BS Extremities: no cyanosis, clubbing, rash. Warm 1+ edema Neuro: Alert & oriented x 3. Cranial nerves grossly intact. Moves all 4 extremities w/o difficulty. Affect pleasant    Telemetry: AF 110-125 Personally  reviewed      Labs: CBC  Recent Labs  01/10/17 0515 01/11/17 0529  WBC 7.4 5.1  HGB 8.4* 8.3*  HCT 28.1* 27.2*  MCV 88.1 88.9  PLT 149* 563*   Basic Metabolic Panel  Recent Labs  01/10/17 0515 01/10/17 1542 01/11/17 0529  NA 134* 136 135  K 3.5 3.9 4.9  CL 99* 101 99*  CO2 26 26 27   GLUCOSE 105* 122* 86  BUN 10 9 8   CREATININE 2.39* 2.24* 1.90*  CALCIUM 8.7* 8.4* 8.7*  MG 2.1  --  2.3  PHOS 1.6* 2.2* 1.6*    BNP: BNP (last 3 results)  Recent Labs  09/18/16 1126 11/07/16 1633 12/26/16 1643  BNP 991.0* 490.4* 1,116.0*    Medications:     Scheduled Medications: . bisacodyl  10 mg Oral Daily  . Chlorhexidine Gluconate Cloth  6 each Topical Daily  . DAPTOmycin (CUBICIN)  IV  1,000 mg Intravenous Once  . darbepoetin (ARANESP) injection - NON-DIALYSIS  150 mcg Subcutaneous Q Tue-1800  . mouth rinse  15 mL Mouth Rinse BID  . potassium chloride  30 mEq Oral Daily  . senna-docusate  2 tablet Oral QHS  . sodium phosphate  Dextrose 5% IVPB  30 mmol Intravenous Once    Infusions: . amiodarone  30 mg/hr (01/11/17 0029)  . heparin 3,400 Units/hr (01/11/17 0220)  . norepinephrine (LEVOPHED) Adult infusion 4 mcg/min (01/10/17 1950)  . dialysis replacement fluid (prismasate) 300 mL/hr at 01/10/17 2310  . dialysis replacement fluid (prismasate) 300 mL/hr at 01/10/17 2309  . dialysate (PRISMASATE) 1,500 mL/hr at 01/11/17 0538    PRN Medications: Place/Maintain arterial line **AND** sodium chloride, Place/Maintain arterial line **AND** sodium chloride, acetaminophen, ALPRAZolam, diphenhydrAMINE-zinc acetate, heparin, heparin, ondansetron (ZOFRAN) IV, oxyCODONE, polyethylene glycol, promethazine, sodium chloride, sorbitol   Assessment/Plan   1. Acute on chronic diastolic CHF 2. Paroxysmal atrial fibrillation on Coumadin    -s/p DC-CV on 3/27 3. Acute on chronic kidney disease stage IV 4. Obesity hypoventilation syndrome.  5. Hyperkalemia 6. PAH with acute  RV failure/cor pulmonale 7. OSA, noncompliant with CPAP 8. Acute on chronic respiratory failure 9. Anemia 10. NSVT 11. Sepsis, VRE  She is improved today. BP stabilized. Weaning norepi. Pulling well with CVVHD. On ab per ID. Lines changed except for HD cath  Volume and respiratory status much improved.   Will plan attempt at TEE (to look at valves and exclude vegetation) and DCCV at bedside on Thursday if remains stable. Will do at bedside.Cotninue amio. Hopefully will have easier time maintaining NSR off milrinone.    Hgb stable.  Length of Stay: 16  Glori Bickers, MD  01/11/2017, 8:20 AM  Advanced Heart Failure Team  Pager [phone] M-F 7am-4pm.  Please contact [facility] for night-coverage after hours (4p -7a ) and weekends on [email or web address]

## 2017-01-11 NOTE — Progress Notes (Signed)
PULMONARY  / CRITICAL CARE MEDICINE  Name: Paige Marshall MRN: [id number] DOB: Oct 08, 1956    LOS: 35  REFERRING MD :  IMTS  CHIEF COMPLAINT:  Shortness of breath  BRIEF PATIENT DESCRIPTION: Paige Marshall is a 61 year old woman with poorly controlled OHS, chronic combined systolic and diastolic CHF, a-fib/a-flutter on chronic anticoagulation, hypertension, COPD, CKD Stage 3 hospitalized for acute decompensated CHF complicated by cardiorenal syndrome on CRRT and now E. Faecalis bacteremia.   LINES / TUBES: 3/31 R IJ HD catheter 4/01 R femoral arterial line 4/02 L IJ  CULTURES: 4/01 E. Faecalis [R to vancomycin, S to ampicilin, gentamicin, linezolid] 4/02 Pending  ANTIBIOTICS: 4/01 Vanc/cefepime >> daptomycin 4/03 Daptomycin >> ampicillin  SIGNIFICANT EVENTS:  3/18: Admitted for acute decompensated CHF 3/21: CRRT initiated for diuresis  3/29: Transferred to stepdown  3/31: Developed fever and PCCM re-consulted   DIET:  Heart Healthy with fluid restriction  DVT Px:  Heparin IV  GI Px:  None  HISTORY OF PRESENT ILLNESS:  61 y/o F admitted from rehab facility (resident since November 2017) on 3/18 with complaints of shortness of breath & lower extremity swelling.    The patient has a medical history of morbid obesity / wheelchair bound, untreated OHS, chronic combined systolic / diastolic CHF, NICM, AF on coumadin, HTN, 3L O2 dependent, pulmonary hypertension, former smoker (11 pk years) with COPD & CKD III.  The patient suffered a left fibular fracture in late 2017.  She was recently admitted from 1/28-11/13/16 for acute CHF exacerbation.  At that time, her discharge weight was 370lbs.    She returned from the rehab facility on 3/18 with increased SOB and lower extremity swelling.  She if followed by Dr. Gwenlyn Found for cardiology and was recently switched to lasix and metolazone.  Lasix was discontinued at the SNF due to rising sr cr.  Her admit weight was 411lbs.  She was admitted  by IMTS for decompensated CHF.  She developed worsening AKI with concern for cardio-renal syndrome.  An HD catheter was inserted 3/21 and CVVHD was initiated.   She remains on milrinone, amiodarone and heparin gtt's.  Rate control has been difficult to achieve.  Over the past 24 hours, she is net negative 3.5L.  She developed respiratory distress am of 3/22 and BiPAP was initiated.    Sent to sdu 3/29, returned now ICU , shock, CVVHD needs, fever.  INTERVAL HISTORY: She is optimistic this morning that she will get better and go home. We discussed what she finds meaningful, and she enjoys spending time with her three grandchildren, grilling, and her cat.  VITAL SIGNS: Temp:  [97.5 F (36.4 C)-98.5 F (36.9 C)] 97.5 F (36.4 C) (04/03 0800) Pulse Rate:  [97-134] 132 (04/03 1100) Resp:  [15-27] 26 (04/03 1100) SpO2:  [89 %-100 %] 94 % (04/03 1100) Arterial Line BP: (103-144)/(54-75) 119/67 (04/03 1100) FiO2 (%):  [50 %] 50 % (04/02 1200) Weight:  [304 lb (137.9 kg)] 304 lb (137.9 kg) (04/03 0600) HEMODYNAMICS: CVP:  [13 mmHg-19 mmHg] 13 mmHg VENTILATOR SETTINGS: FiO2 (%):  [50 %] 50 % INTAKE / OUTPUT: Intake/Output      04/02 0701 - 04/03 0700 04/03 0701 - 04/04 0700   P.O. 220 50   I.V. (mL/kg) 1405.6 (10.2) 109 (0.8)   Blood     IV Piggyback 258    Total Intake(mL/kg) 1883.6 (13.7) 159 (1.2)   Other 4533 779   Stool 0    Total Output 4533 779  Net -2649.4 -620        Stool Occurrence 2 x      PHYSICAL EXAMINATION: Physical Exam  Constitutional: She is oriented to person, place, and time. No distress.  HENT:  Head: Normocephalic and atraumatic.  Neck:  R IJ HD cathether. L IJ catheter. Obese neck which limits JVD visualization.  Cardiovascular:  Tachycardic, intermittently irregular  Pulmonary/Chest: Effort normal. No respiratory distress.  Abdominal: Soft. Bowel sounds are normal.  Neurological: She is alert and oriented to person, place, and time.  Skin: Skin is  warm and dry. She is not diaphoretic.     LABS: Cbc  Recent Labs Lab 01/09/17 0500 01/10/17 0515 01/11/17 0529  WBC 7.3 7.4 5.1  HGB 7.1* 8.4* 8.3*  HCT 23.9* 28.1* 27.2*  PLT 146* 149* 149*    Chemistry   Recent Labs Lab 01/08/17 2142  01/10/17 0515 01/10/17 1542 01/11/17 0529  NA 132*  < > 134* 136 135  K 3.2*  < > 3.5 3.9 4.9  CL 96*  < > 99* 101 99*  CO2 27  < > 26 26 27   BUN 10  < > 10 9 8   CREATININE 2.58*  < > 2.39* 2.24* 1.90*  CALCIUM 8.3*  < > 8.7* 8.4* 8.7*  MG 1.9  --  2.1  --  2.3  PHOS  --   < > 1.6* 2.2* 1.6*  GLUCOSE 99  < > 105* 122* 86  < > = values in this interval not displayed.  Liver fxn  Recent Labs Lab 01/08/17 2142  01/10/17 0515 01/10/17 1542 01/11/17 0529  AST 22  --   --   --   --   ALT 17  --   --   --   --   ALKPHOS 64  --   --   --   --   BILITOT 2.0*  --   --   --   --   PROT 5.8*  --   --   --   --   ALBUMIN 2.7*  < > 2.6* 2.6* 2.6*  < > = values in this interval not displayed. coags  Recent Labs Lab 01/07/17 0941  INR 1.50   Cardiac markers  Recent Labs Lab 01/10/17 0515  CKTOTAL 27*   ABG  Recent Labs Lab 01/07/17 1100 01/08/17 2305  PHART  --  7.440  PCO2ART  --  41.2  PO2ART  --  51.7*  HCO3 24.0  25.0 27.5  TCO2 25  26  --     CBG trend  Recent Labs Lab 01/04/17 1538  GLUCAP 111*    DIAGNOSES: Principal Problem:   VRE bacteremia Active Problems:   Essential hypertension   PAF (paroxysmal atrial fibrillation) (HCC)   Obstructive sleep apnea   Morbid obesity (HCC)   Obesity hypoventilation syndrome (HCC)   COPD mixed type (HCC)   Chronic respiratory failure with hypoxia (HCC)   Acute kidney injury superimposed on CKD ([place])   Acute on chronic combined systolic and diastolic CHF (congestive heart failure) (HCC)   Hyperkalemia   Thoracic aortic atherosclerosis (HCC)   Cardiorenal syndrome   Acute hypoxemic respiratory failure (HCC)   History of conscious sedation   Acute  pulmonary edema (HCC)   Severe sepsis with septic shock ([place])   Hemodialysis catheter infection ([place])   ASSESSMENT / PLAN:  PULMONARY  ASSESSMENT: Acute on chronic hypoxemic hypercapneic respiratory failure History of COPD  PLAN:   -Encourage CPAP use -Continue  diuresis with CRRT   CARDIOVASCULAR  ASSESSMENT:  Acute on chronic combined diastolic CHF with volume overload Acute RV A-fib/a-flutter on chronic anticoagulation Hypertension  PLAN:  -Continue norepinephrine for pressure support -Continue amiodarone for rhythm control -Continue milrinone per Heart Failure -Continue heparin IV -DCCV per Heart Failure    RENAL  ASSESSMENT:  Acute on chronic renal failure   PLAN:   -CRRT per Nephrology.  -Follow electrolytes.   HEMATOLOGIC  ASSESSMENT:   Normocytic anemia: Hb at baseline 7-8. Suspect chronic renal insufficiency.  PLAN:  -Follow CBC   INFECTIOUS  ASSESSMENT:   E. Faecalis bacteremia: Sensitive to ampicillin.  PLAN:   -Narrow antibiotics to ampicillin per ID recommendations -Follow-up TEE, possibly Thursday, 4/5, per Heart Failure   NEUROLOGIC  ASSESSMENT:   Anxiety  PLAN:   -Continue Xanax as needed   CLINICAL SUMMARY: Paige Marshall is a 61 year old woman with poorly controlled OHS, chronic combined systolic and diastolic CHF, a-fib/a-flutter on chronic anticoagulation, hypertension, COPD, CKD Stage 3 hospitalized for acute decompensated CHF complicated by cardiorenal syndrome on CRRT and now E. Faecalis bacteremia.   Charlott Rakes, PGY3 Internal Medicine Pager: [phone] 01/11/2017, 11:05 AM    Attending Note:  I have examined patient, reviewed labs, studies and notes. I have discussed the case with Dr Posey Pronto, and I agree with the data and plans as amended above. Patient is 64 with a history of morbid obesity, obstructive sleep apnea/obesity hyperventilation syndrome, severe secondary pulmonary hypertension, atrial  fibrillation/flutter admitted with acute on chronic renal failure from presumed cardiorenal syndrome. She was started on dialysis and supported with milrinone to achieve diuresis. She had stabilized to some degree but returned to the ICU 3/29 with septic shock due to VRE bacteremia. She was restarted on pressors, continue on amiodarone, CVVHD. Her lines have been changed with the exception of her tunneled HD catheter. She remains on norepinephrine 4. On my evaluation she is awake, interactive, appropriate. She has a nonfocal neurological exam. Her heart is tachycardic with some irregularity, 120s on the monitor. Lungs are diminished at the bilateral bases. Abdomen is obese, benign. Bilateral lower extremity edema. She is net -38.4 L for the hospitalization. Based on infectious disease recommendations we will change her daptomycin to ampicillin on 4/3. Follow blood cultures. Hopefully these will remain negative and her tunneled HD catheter can remain in place. If we do get a positive culture than it may need to be removed and changed. Tentative plan is for a TEE and possible cardioversion on 4/5 . She has been compliant with BiPAP during hospitalization and we will continue this daily at bedtime. Independent critical care time is 35 minutes.   Baltazar Apo, MD, PhD 01/11/2017, 12:04 PM [facility] [phone] or if no answer [phone]

## 2017-01-11 NOTE — Progress Notes (Signed)
[facility] Progress Note Patient Name: Paige Marshall DOB: 06/23/1956 MRN: [id number]   Date of Service  01/11/2017  HPI/Events of Note  itching  eICU Interventions  Benadryl cream: apply to affected area     Intervention Category Minor Interventions: Routine modifications to care plan (e.g. PRN medications for pain, fever)  Simonne Maffucci 01/11/2017, 4:32 AM

## 2017-01-12 DIAGNOSIS — I2721 Secondary pulmonary arterial hypertension: Secondary | ICD-10-CM | POA: Diagnosis present

## 2017-01-12 LAB — RENAL FUNCTION PANEL
ALBUMIN: 2.8 g/dL — AB (ref 3.5–5.0)
Albumin: 2.6 g/dL — ABNORMAL LOW (ref 3.5–5.0)
Anion gap: 10 (ref 5–15)
Anion gap: 11 (ref 5–15)
BUN: 7 mg/dL (ref 6–20)
BUN: 7 mg/dL (ref 6–20)
CHLORIDE: 101 mmol/L (ref 101–111)
CHLORIDE: 99 mmol/L — AB (ref 101–111)
CO2: 25 mmol/L (ref 22–32)
CO2: 25 mmol/L (ref 22–32)
CREATININE: 1.72 mg/dL — AB (ref 0.44–1.00)
CREATININE: 1.62 mg/dL — AB (ref 0.44–1.00)
Calcium: 8.7 mg/dL — ABNORMAL LOW (ref 8.9–10.3)
Calcium: 8.7 mg/dL — ABNORMAL LOW (ref 8.9–10.3)
GFR calc Af Amer: 39 mL/min — ABNORMAL LOW (ref >60)
GFR calc non Af Amer: 32 mL/min — ABNORMAL LOW (ref >60)
GFR, EST AFRICAN AMERICAN: 37 mL/min — AB (ref >60)
GFR, EST NON AFRICAN AMERICAN: 34 mL/min — AB (ref >60)
Glucose, Bld: 94 mg/dL (ref 65–99)
Glucose, Bld: 107 mg/dL — ABNORMAL HIGH (ref 65–99)
PHOSPHORUS: 4.5 mg/dL (ref 2.5–4.6)
Phosphorus: 2 mg/dL — ABNORMAL LOW (ref 2.5–4.6)
Potassium: 3.7 mmol/L (ref 3.5–5.1)
Potassium: 4.8 mmol/L (ref 3.5–5.1)
SODIUM: 135 mmol/L (ref 135–145)
Sodium: 136 mmol/L (ref 135–145)

## 2017-01-12 LAB — COOXEMETRY PANEL
CARBOXYHEMOGLOBIN: 1.6 % — AB (ref 0.5–1.5)
Carboxyhemoglobin: 1.4 % (ref 0.5–1.5)
METHEMOGLOBIN: 1.1 % (ref 0.0–1.5)
Methemoglobin: 1 % (ref 0.0–1.5)
O2 SAT: 32.2 %
O2 Saturation: 43.5 %
TOTAL HEMOGLOBIN: 11.6 g/dL — AB (ref 12.0–16.0)
Total hemoglobin: 8.4 g/dL — ABNORMAL LOW (ref 12.0–16.0)

## 2017-01-12 LAB — CBC
HCT: 28 % — ABNORMAL LOW (ref 36.0–46.0)
Hemoglobin: 8.1 g/dL — ABNORMAL LOW (ref 12.0–15.0)
MCH: 25.8 pg — ABNORMAL LOW (ref 26.0–34.0)
MCHC: 28.9 g/dL — ABNORMAL LOW (ref 30.0–36.0)
MCV: 89.2 fL (ref 78.0–100.0)
PLATELETS: 158 10*3/uL (ref 150–400)
RBC: 3.14 MIL/uL — AB (ref 3.87–5.11)
RDW: 25.1 % — ABNORMAL HIGH (ref 11.5–15.5)
WBC: 5 10*3/uL (ref 4.0–10.5)

## 2017-01-12 LAB — IRON AND TIBC
Iron: 48 ug/dL (ref 28–170)
SATURATION RATIOS: 17 % (ref 10.4–31.8)
TIBC: 284 ug/dL (ref 250–450)
UIBC: 236 ug/dL

## 2017-01-12 LAB — MAGNESIUM: MAGNESIUM: 2.3 mg/dL (ref 1.7–2.4)

## 2017-01-12 LAB — HEPARIN LEVEL (UNFRACTIONATED): HEPARIN UNFRACTIONATED: 0.64 [IU]/mL (ref 0.30–0.70)

## 2017-01-12 MED ORDER — SODIUM PHOSPHATES 45 MMOLE/15ML IV SOLN
30.0000 mmol | Freq: Once | INTRAVENOUS | Status: AC
  Administered 2017-01-12: 30 mmol via INTRAVENOUS
  Filled 2017-01-12: qty 10

## 2017-01-12 MED ORDER — MIDODRINE HCL 5 MG PO TABS
2.5000 mg | ORAL_TABLET | Freq: Three times a day (TID) | ORAL | Status: DC
  Administered 2017-01-12 – 2017-01-13 (×5): 2.5 mg via ORAL
  Filled 2017-01-12 (×6): qty 1

## 2017-01-12 NOTE — Progress Notes (Signed)
Advanced Heart Failure Rounding Note  PCP: EDWARDS, MICHELLE P, NP  Primary Cardiologist: Dr. Gwenlyn Found   Subjective:    Septic Shock over the weekend. Bld CX- VRE. ID following and narrowed antibiotics.  Off norepi. CO-OX down to 44%. Remains on CVVHD.   Remains on amio drip. In AFib 110-120s. On heparin.   Overall feeling ok. Denies SOB. No fevers.     Objective:   Weight Range: 293 lb (132.9 kg) Body mass index is 45.21 kg/m.   Vital Signs:   Temp:  [97.6 F (36.4 C)-98.3 F (36.8 C)] 97.8 F (36.6 C) (04/04 0400) Pulse Rate:  [72-133] 93 (04/04 0700) Resp:  [16-26] 17 (04/04 0700) BP: (75-105)/(55-75) 92/65 (04/04 0700) SpO2:  [89 %-100 %] 100 % (04/04 0700) Arterial Line BP: (97-140)/(42-73) 98/45 (04/04 0700) FiO2 (%):  [50 %] 50 % (04/04 0025) Weight:  [293 lb (132.9 kg)] 293 lb (132.9 kg) (04/04 0500) Last BM Date: 01/12/17  Weight change: Filed Weights   01/10/17 0500 01/11/17 0600 01/12/17 0500  Weight: (!) 323 lb (146.5 kg) (!) 304 lb (137.9 kg) 293 lb (132.9 kg)    Intake/Output:   Intake/Output Summary (Last 24 hours) at 01/12/17 0806 Last data filed at 01/12/17 0700  Gross per 24 hour  Intake          1761.11 ml  Output             4573 ml  Net         -2811.89 ml     Physical Exam: General: Obese. In bed. NAD.  HEENT: Normal. anicteric Neck: Supple. JJVP hard to assess.  Carotids 2+ bilat; no bruits RIJ tunneled cath. LIC TLC Cor: PMI non-palpable. IRR no obvious murmur Lungs: Decreased. No wheeze.  Abdomen: obese, NT, ND, + BS Extremities: no cyanosis, clubbing, rash. Warm R and LLE trace-1+ edema.  Neuro: A&O x3. MAE x4.  Affect pleasant    Telemetry: Afib 110-120s Personally reviewed.       Labs: CBC  Recent Labs  01/11/17 0529 01/12/17 0507  WBC 5.1 5.0  HGB 8.3* 8.1*  HCT 27.2* 28.0*  MCV 88.9 89.2  PLT 149* 811   Basic Metabolic Panel  Recent Labs  01/11/17 0529 01/12/17 0601  NA 135 136  K 4.9 3.7  CL 99*  101  CO2 27 25  GLUCOSE 86 94  BUN 8 7  CREATININE 1.90* 1.72*  CALCIUM 8.7* 8.7*  MG 2.3 2.3  PHOS 1.6* 2.0*    BNP: BNP (last 3 results)  Recent Labs  09/18/16 1126 11/07/16 1633 12/26/16 1643  BNP 991.0* 490.4* 1,116.0*    Medications:     Scheduled Medications: . ampicillin (OMNIPEN) IV  2 g Intravenous Q8H  . bisacodyl  10 mg Oral Daily  . Chlorhexidine Gluconate Cloth  6 each Topical Daily  . darbepoetin (ARANESP) injection - NON-DIALYSIS  150 mcg Subcutaneous Q Tue-1800  . mouth rinse  15 mL Mouth Rinse BID  . potassium chloride  30 mEq Oral Daily  . senna-docusate  2 tablet Oral QHS  . sodium chloride flush  3 mL Intravenous Q12H  . sodium phosphate  Dextrose 5% IVPB  30 mmol Intravenous Once    Infusions: . sodium chloride    . amiodarone 30 mg/hr (01/12/17 0027)  . heparin 3,400 Units/hr (01/12/17 0043)  . norepinephrine (LEVOPHED) Adult infusion Stopped (01/12/17 0057)  . dialysis replacement fluid (prismasate) 300 mL/hr at 01/11/17 1614  . dialysis replacement fluid (  prismasate) 300 mL/hr at 01/11/17 1614  . dialysate (PRISMASATE) 1,500 mL/hr at 01/12/17 0551    PRN Medications: Place/Maintain arterial line **AND** sodium chloride, Place/Maintain arterial line **AND** sodium chloride, acetaminophen, ALPRAZolam, diphenhydrAMINE-zinc acetate, heparin, heparin, ondansetron (ZOFRAN) IV, oxyCODONE, polyethylene glycol, promethazine, sodium chloride, sodium chloride flush, sorbitol   Assessment/Plan   1. Acute on chronic diastolic CHF Volume status managed by with CVVHD. Per Nephrology.  2. Paroxysmal atrial fibrillation on Coumadin    -s/p DC-CV on 3/27 - Continue amio drip. Plan for TEE DC-CV on 01/13/17 Remain on heparin drip. Hemoglobin stable.  3. Acute on chronic kidney disease stage IV Remains on CVVHD per Nephrology.  4. Obesity hypoventilation syndrome.  5. Hyperkalemia Improved. Todays K 3.7  6. PAH with acute RV failure/cor pulmonale 7.  Acute on chronic respiratory failure 8. Anemia- Hemoglobin 8.1  9. NSVT- K 3.7  10. Septic Shock- Blood CX--> VRE- Repeat Blood CX negative. ID following.  Off norepi. CO-OX low. Repeat now. Add 2.5 mg midodrine tid.  Plan for TEE evaluate for vegetation.   Length of Stay: [place] NP-C  8:08 AM  Advanced Heart Failure Team  Pager [phone] M-F 7am-4pm.  Please contact [facility] for night-coverage after hours (4p -7a ) and weekends on [email or web address]  Patient seen and examined with Darrick Grinder, NP. We discussed all aspects of the encounter. I agree with the assessment and plan as stated above.   Remains tenuous. Co-ox back down off inotropes. Likely due to RV failure. AF likely contributing.   Sepsis has resolved. BP stable off norepi. Can pull arterial line. ID managing abx. Will plan for TEE and cardioversion at bedsde tomorrow. Continue heparin.   Remains on CVVHD. CVP 15. Continue to pull as tolerated.   Hgb currently stable.   Glori Bickers, MD  7:55 PM

## 2017-01-12 NOTE — Progress Notes (Signed)
ANTICOAGULATION CONSULT NOTE - Follow Up Consult  Pharmacy Consult for Heparin  Indication: atrial fibrillation  Allergies  Allergen Reactions  . Penicillins Hives, Itching, Swelling and Other (See Comments)    Tongue swelling Has patient had a PCN reaction causing immediate rash, facial/tongue/throat swelling, SOB or lightheadedness with hypotension: Yes  Clarified with the patient her penicillin allergy. Pt has tolerated cefepime in the past. Pt also states that she can take amoxicillin.    . Citrus Hives   Patient Measurements: Height: 5' 7.5" (171.5 cm) Weight: 293 lb (132.9 kg) IBW/kg (Calculated) : 62.75  Vital Signs: Temp: 97.8 F (36.6 C) (04/04 0400) Temp Source: Oral (04/03 2300) BP: 92/65 (04/04 0700) Pulse Rate: 93 (04/04 0700)  Labs:  Recent Labs  01/10/17 0515 01/10/17 1542 01/11/17 0529 01/12/17 0500 01/12/17 0507 01/12/17 0601  HGB 8.4*  --  8.3*  --  8.1*  --   HCT 28.1*  --  27.2*  --  28.0*  --   PLT 149*  --  149*  --  158  --   HEPARINUNFRC 0.66  --  0.52 0.64  --   --   CREATININE 2.39* 2.24* 1.90*  --   --  1.72*  CKTOTAL 27*  --   --   --   --   --     Estimated Creatinine Clearance: 51.1 mL/min (A) (by C-G formula based on SCr of 1.72 mg/dL (H)).  Assessment: 60yof on coumadin pta for afib, admitted with volume overload and renal failure. INR 2.84 on admit and coumadin initially resumed (received one dose of 5mg ). On 3/20, coumadin held, and she received 5mg  vitamin k to reverse INR w/need for CRRT. Heparin bridge started.  Heparin resumed s/p tunneled HD cath placement 3/29. Heparin at goal this am. Hgb stable 8.1. No bleeding issues noted.  Goal of Therapy:  Heparin level 0.3-0.7 units/ml Monitor platelets by anticoagulation protocol: Yes   Plan:  Continue heparin gtt at 3400 unit/hr Daily heparin level/CBC Monitor for s/sx of bleeding Follow up when able to resume coumadin  Erin Hearing PharmD., BCPS Clinical  Pharmacist Pager [phone] 01/12/2017 8:10 AM

## 2017-01-12 NOTE — Progress Notes (Signed)
Dr. Hollie Salk notified of ecg complex widening with bundle branch block noted.  Blood pressure and other vital signs stable.  Pt is almost 44 liters negative.  CVP remains 16.  Orders received to run even on CRRT for next couple of hours.  Will continue to monitor pt closely.

## 2017-01-12 NOTE — Progress Notes (Signed)
PULMONARY  / CRITICAL CARE MEDICINE  Name: Paige Marshall MRN: [id number] DOB: 07/08/1956    LOS: 67  REFERRING MD :  IMTS  CHIEF COMPLAINT:  Shortness of breath  BRIEF PATIENT DESCRIPTION: Paige Marshall is a 61 year old woman with poorly controlled OHS, chronic combined systolic and diastolic CHF, a-fib/a-flutter on chronic anticoagulation, hypertension, COPD, CKD Stage 3 hospitalized for acute decompensated CHF complicated by cardiorenal syndrome on CRRT and now E. Faecalis bacteremia.   LINES / TUBES: 3/31 R IJ HD catheter 4/01 R femoral arterial line 4/02 L IJ  CULTURES: 4/01 E. Faecalis [R to vancomycin, S to ampicilin, gentamicin, linezolid] 4/02 Negative  ANTIBIOTICS: 4/01 Vanc/cefepime >> daptomycin 4/03 Daptomycin >> ampicillin  SIGNIFICANT EVENTS:  3/18: Admitted for acute decompensated CHF 3/21: CRRT initiated for diuresis  3/29: Transferred to stepdown  3/31: Developed fever and PCCM re-consulted   DIET:  Heart Healthy with fluid restriction  DVT Px:  Heparin IV  GI Px:  None  HISTORY OF PRESENT ILLNESS:  61 y/o F admitted from rehab facility (resident since November 2017) on 3/18 with complaints of shortness of breath & lower extremity swelling.    The patient has a medical history of morbid obesity / wheelchair bound, untreated OHS, chronic combined systolic / diastolic CHF, NICM, AF on coumadin, HTN, 3L O2 dependent, pulmonary hypertension, former smoker (11 pk years) with COPD & CKD III.  The patient suffered a left fibular fracture in late 2017.  She was recently admitted from 1/28-11/13/16 for acute CHF exacerbation.  At that time, her discharge weight was 370lbs.    She returned from the rehab facility on 3/18 with increased SOB and lower extremity swelling.  She if followed by Paige Marshall for cardiology and was recently switched to lasix and metolazone.  Lasix was discontinued at the SNF due to rising sr cr.  Her admit weight was 411lbs.  She was admitted  by IMTS for decompensated CHF.  She developed worsening AKI with concern for cardio-renal syndrome.  An HD catheter was inserted 3/21 and CVVHD was initiated.   She remains on milrinone, amiodarone and heparin gtt's.  Rate control has been difficult to achieve.  Over the past 24 hours, she is net negative 3.5L.  She developed respiratory distress am of 3/22 and BiPAP was initiated.    Sent to sdu 3/29, returned now ICU , shock, CVVHD needs, fever.  INTERVAL HISTORY: She is still in bright spirits this morning and hopes to make a full recovery.  VITAL SIGNS: Temp:  [97.5 F (36.4 C)-98.3 F (36.8 C)] 97.5 F (36.4 C) (04/04 1600) Pulse Rate:  [72-131] 124 (04/04 1600) Resp:  [16-25] 23 (04/04 1600) BP: (75-144)/(51-120) 93/60 (04/04 1600) SpO2:  [90 %-100 %] 94 % (04/04 1600) Arterial Line BP: (97-140)/(42-73) 120/64 (04/04 1600) FiO2 (%):  [50 %] 50 % (04/04 0025) Weight:  [293 lb (132.9 kg)] 293 lb (132.9 kg) (04/04 0500) HEMODYNAMICS: CVP:  [16 mmHg-17 mmHg] 17 mmHg VENTILATOR SETTINGS: FiO2 (%):  [50 %] 50 % INTAKE / OUTPUT: Intake/Output      04/03 0701 - 04/04 0700 04/04 0701 - 04/05 0700   P.O. 210 610   I.V. (mL/kg) 1419.6 (10.7) 502.4 (3.8)   IV Piggyback 186 308   Total Intake(mL/kg) 1815.6 (13.7) 1420.4 (10.7)   Urine (mL/kg/hr)  0 (0)   Other 4763 (1.5) 2389 (1.9)   Stool 0 (0)    Total Output 4763 2389   Net -2947.4 -968.6  Stool Occurrence 1 x      PHYSICAL EXAMINATION: Physical Exam  Constitutional: She is oriented to person, place, and time. No distress.  HENT:  Head: Normocephalic and atraumatic.  Eyes: Conjunctivae are normal. No scleral icterus.  Neck:  L IJ catheter, R HD catheter  Cardiovascular: Normal rate.   Intermittently irregular  Pulmonary/Chest: Effort normal. No respiratory distress.  Abdominal: Soft. She exhibits no distension.  Neurological: She is alert and oriented to person, place, and time.  Skin: Skin is warm and dry. She  is not diaphoretic.     LABS: Cbc  Recent Labs Lab 01/10/17 0515 01/11/17 0529 01/12/17 0507  WBC 7.4 5.1 5.0  HGB 8.4* 8.3* 8.1*  HCT 28.1* 27.2* 28.0*  PLT 149* 149* 158    Chemistry   Recent Labs Lab 01/10/17 0515 01/10/17 1542 01/11/17 0529 01/12/17 0601  NA 134* 136 135 136  K 3.5 3.9 4.9 3.7  CL 99* 101 99* 101  CO2 26 26 27 25   BUN 10 9 8 7   CREATININE 2.39* 2.24* 1.90* 1.72*  CALCIUM 8.7* 8.4* 8.7* 8.7*  MG 2.1  --  2.3 2.3  PHOS 1.6* 2.2* 1.6* 2.0*  GLUCOSE 105* 122* 86 94    Liver fxn  Recent Labs Lab 01/08/17 2142  01/10/17 1542 01/11/17 0529 01/12/17 0601  AST 22  --   --   --   --   ALT 17  --   --   --   --   ALKPHOS 64  --   --   --   --   BILITOT 2.0*  --   --   --   --   PROT 5.8*  --   --   --   --   ALBUMIN 2.7*  < > 2.6* 2.6* 2.6*  < > = values in this interval not displayed. coags  Recent Labs Lab 01/07/17 0941  INR 1.50   Cardiac markers  Recent Labs Lab 01/10/17 0515  CKTOTAL 27*   ABG  Recent Labs Lab 01/07/17 1100 01/08/17 2305  PHART  --  7.440  PCO2ART  --  41.2  PO2ART  --  51.7*  HCO3 24.0  25.0 27.5  TCO2 25  26  --     CBG trend No results for input(s): GLUCAP in the last 168 hours.  DIAGNOSES: Principal Problem:   VRE bacteremia Active Problems:   Essential hypertension   PAF (paroxysmal atrial fibrillation) (HCC)   Obstructive sleep apnea   Morbid obesity (HCC)   Obesity hypoventilation syndrome (HCC)   COPD mixed type (HCC)   Chronic respiratory failure with hypoxia (HCC)   Acute kidney injury superimposed on CKD (HCC)   Acute on chronic combined systolic and diastolic CHF (congestive heart failure) (HCC)   Hyperkalemia   Thoracic aortic atherosclerosis (HCC)   Cardiorenal syndrome   Acute hypoxemic respiratory failure (HCC)   History of conscious sedation   Acute pulmonary edema (HCC)   Severe sepsis with septic shock (HCC)   Hemodialysis catheter infection  ([place])   ASSESSMENT / PLAN:  PULMONARY  ASSESSMENT: Acute on chronic hypoxemic hypercapneic respiratory failure OSA/OHS History of COPD  PLAN:   -Encourage CPAP use  CARDIOVASCULAR  ASSESSMENT:  Acute on chronic combined diastolic CHF with volume overload A-fib/a-flutter on chronic anticoagulation Hypertension  PLAN:  -Continue amiodarone for rhythm control -Continue norepi and midodrine per Heart Failure -Continue heparin IV -Bedside TEE DCCV tenatively scheduled for tomorrow  RENAL  ASSESSMENT:  Acute on chronic renal failure Stage IV  PLAN:   -CRRT per Heart Failure/Renal -Follow electrolytes   HEMATOLOGIC  ASSESSMENT:   Normocytic anemia: Hb at baseline 7-8. Suspect chronic renal insufficiency.  PLAN:  -Follow CBC   INFECTIOUS  ASSESSMENT:   E. Faecalis bacteremia: Sensitive to ampicillin. Follow cultures without growth which is reassuring.  PLAN:   -Continue ampicillin -Rerequest removal of arterial line -Follow-up TEE as noted above   NEUROLOGIC  ASSESSMENT:   Anxiety  PLAN:   -Continue Xanax as needed   CLINICAL SUMMARY: Ms. Strohman is a 61 year old woman with poorly controlled OHS, chronic combined systolic and diastolic CHF, a-fib/a-flutter on chronic anticoagulation, hypertension, COPD, CKD Stage 3 hospitalized for acute decompensated CHF complicated by cardiorenal syndrome on CRRT and now E. Faecalis bacteremia.   Charlott Rakes, PGY3 Internal Medicine Pager: [phone] 01/12/2017, 4:31 PM   Attending Note:  I have examined patient, reviewed labs, studies and notes. I have discussed the case with Dr Posey Pronto, and I agree with the data and plans as amended above. 61 yo woman with obesity, OSA / OHS, A Fib / flutter, secondary PAH and acute renal failure, likely due to cardiorenal syndrome. She is back in the ICU after developing multifactorial shock, mainly due to sepsis and VRE bacteremia + A Fib / RVR. She has been requiring CVVHD,  aggressive volume removal. Her norepi was transiently weaned to off 4/4, restarted at low dose currently after a low co-oximetry. Currently on ampicillin per ID recs. She is awake, interacting. Lungs are distant at bases but no wheeze or crackles. She still has significant LE edema. Planning to continue CVVHD and volume removal per Nephrology plans. She is scheduled for TEE and cardioversion on 4/5. Will continue nocturnal PPV for her OC+SA / OHS.  Independent critical care time is 34 minutes.   Baltazar Apo, MD, PhD 01/12/2017, 5:30 PM [facility] [phone] or if no answer [phone]

## 2017-01-12 NOTE — Anesthesia Preprocedure Evaluation (Addendum)
Anesthesia Evaluation    Reviewed: Allergy & Precautions, Patient's Chart, lab work & pertinent test results  Airway Mallampati: III  TM Distance: >3 FB Neck ROM: Full    Dental  (+) Teeth Intact, Dental Advisory Given   Pulmonary asthma , sleep apnea , COPD, former smoker,  Obesity hypoventilation syndrome   Pulmonary exam normal breath sounds clear to auscultation       Cardiovascular hypertension, Pt. on home beta blockers and Pt. on medications + Peripheral Vascular Disease and +CHF  Normal cardiovascular exam+ dysrhythmias Atrial Fibrillation  Rhythm:Regular Rate:Normal     Neuro/Psych CVA    GI/Hepatic negative GI ROS, Neg liver ROS,   Endo/Other  Morbid obesity  Renal/GU Renal Insufficiency and DialysisRenal disease (CVVHD)     Musculoskeletal  (+) Arthritis , Rheumatoid disorders,    Abdominal   Peds  Hematology  (+) Blood dyscrasia, anemia ,   Anesthesia Other Findings Day of surgery medications reviewed with the patient.  bacteremic with VRE  Reproductive/Obstetrics                            Anesthesia Physical Anesthesia Plan  ASA: IV  Anesthesia Plan: MAC   Post-op Pain Management:    Induction: Intravenous  Airway Management Planned:   Additional Equipment:   Intra-op Plan:   Post-operative Plan:   Informed Consent: I have reviewed the patients History and Physical, chart, labs and discussed the procedure including the risks, benefits and alternatives for the proposed anesthesia with the patient or authorized representative who has indicated his/her understanding and acceptance.   Dental advisory given  Plan Discussed with: CRNA  Anesthesia Plan Comments:        Anesthesia Quick Evaluation

## 2017-01-12 NOTE — Progress Notes (Signed)
Subjective:  Pt in prayer with loved ones when I came to examine patient   Antibiotics:  Anti-infectives    Start     Dose/Rate Route Frequency Ordered Stop   01/11/17 2000  DAPTOmycin (CUBICIN) 1,000 mg in sodium chloride 0.9 % IVPB  Status:  Discontinued     1,000 mg 240 mL/hr over 30 Minutes Intravenous  Once 01/10/17 1249 01/11/17 1000   01/11/17 1600  ampicillin (OMNIPEN) 2 g in sodium chloride 0.9 % 50 mL IVPB     2 g 150 mL/hr over 20 Minutes Intravenous Every 8 hours 01/11/17 1000     01/09/17 2200  ceFEPIme (MAXIPIME) 1 g in dextrose 5 % 50 mL IVPB  Status:  Discontinued     1 g 100 mL/hr over 30 Minutes Intravenous Every 24 hours 01/09/17 0033 01/09/17 1118   01/09/17 2200  ceFEPIme (MAXIPIME) 2 g in dextrose 5 % 50 mL IVPB  Status:  Discontinued     2 g 100 mL/hr over 30 Minutes Intravenous Every 24 hours 01/09/17 1118 01/09/17 1130   01/09/17 2200  ceFEPIme (MAXIPIME) 2 g in dextrose 5 % 50 mL IVPB  Status:  Discontinued     2 g 100 mL/hr over 30 Minutes Intravenous Every 12 hours 01/09/17 1130 01/09/17 1914   01/09/17 1900  DAPTOmycin (CUBICIN) 1,000 mg in sodium chloride 0.9 % IVPB     1,000 mg 240 mL/hr over 30 Minutes Intravenous  Once 01/09/17 1852 01/09/17 2013   01/09/17 0045  vancomycin (VANCOCIN) 2,000 mg in sodium chloride 0.9 % 500 mL IVPB     2,000 mg 250 mL/hr over 120 Minutes Intravenous  Once 01/09/17 0033 01/09/17 0318   01/09/17 0045  ceFEPIme (MAXIPIME) 2 g in dextrose 5 % 50 mL IVPB     2 g 100 mL/hr over 30 Minutes Intravenous  Once 01/09/17 0033 01/09/17 0149   01/06/17 0545  vancomycin (VANCOCIN) 1,500 mg in sodium chloride 0.9 % 500 mL IVPB     1,500 mg 250 mL/hr over 120 Minutes Intravenous To Radiology 01/06/17 0533 01/06/17 1805      Medications: Scheduled Meds: . ampicillin (OMNIPEN) IV  2 g Intravenous Q8H  . bisacodyl  10 mg Oral Daily  . Chlorhexidine Gluconate Cloth  6 each Topical Daily  . darbepoetin (ARANESP)  injection - NON-DIALYSIS  150 mcg Subcutaneous Q Tue-1800  . mouth rinse  15 mL Mouth Rinse BID  . midodrine  2.5 mg Oral TID WC  . potassium chloride  30 mEq Oral Daily  . senna-docusate  2 tablet Oral QHS  . sodium chloride flush  3 mL Intravenous Q12H   Continuous Infusions: . sodium chloride    . amiodarone 30 mg/hr (01/12/17 1355)  . heparin 3,400 Units/hr (01/12/17 1008)  . norepinephrine (LEVOPHED) Adult infusion 3 mcg/min (01/12/17 1400)  . dialysis replacement fluid (prismasate) 300 mL/hr at 01/12/17 1234  . dialysis replacement fluid (prismasate) 300 mL/hr at 01/12/17 0919  . dialysate (PRISMASATE) 1,500 mL/hr at 01/12/17 1605   PRN Meds:.Place/Maintain arterial line **AND** sodium chloride, Place/Maintain arterial line **AND** sodium chloride, acetaminophen, ALPRAZolam, diphenhydrAMINE-zinc acetate, heparin, heparin, ondansetron (ZOFRAN) IV, oxyCODONE, polyethylene glycol, promethazine, sodium chloride, sodium chloride flush, sorbitol    Objective: Weight change: -11 lb (-4.99 kg)  Intake/Output Summary (Last 24 hours) at 01/12/17 1757 Last data filed at 01/12/17 1700  Gross per 24 hour  Intake          2992.[place]  ml  Output             5433 ml  Net         -2440.78 ml   Blood pressure (!) 146/73, pulse (!) 118, temperature 97.5 F (36.4 C), temperature source Oral, resp. rate 19, height 5' 7.5" (1.715 m), weight 293 lb (132.9 kg), last menstrual period 09/07/2011, SpO2 95 %. Temp:  [97.5 F (36.4 C)-98.3 F (36.8 C)] 97.5 F (36.4 C) (04/04 1600) Pulse Rate:  [72-131] 118 (04/04 1700) Resp:  [16-25] 19 (04/04 1700) BP: (75-146)/(51-120) 146/73 (04/04 1700) SpO2:  [90 %-100 %] 95 % (04/04 1700) Arterial Line BP: (97-140)/(42-73) 120/64 (04/04 1600) FiO2 (%):  [50 %] 50 % (04/04 0025) Weight:  [293 lb (132.9 kg)] 293 lb (132.9 kg) (04/04 0500)  Physical Exam: General: Alert and awake engaged in prayer CBC:  CBC Latest Ref Rng & Units 01/12/2017 01/11/2017 01/10/2017    WBC 4.0 - 10.5 K/uL 5.0 5.1 7.4  Hemoglobin 12.0 - 15.0 g/dL 8.1(L) 8.3(L) 8.4(L)  Hematocrit 36.0 - 46.0 % 28.0(L) 27.2(L) 28.1(L)  Platelets 150 - 400 K/uL 158 149(L) 149(L)    BMET  Recent Labs  01/12/17 0601 01/12/17 1600  NA 136 135  K 3.7 4.8  CL 101 99*  CO2 25 25  GLUCOSE 94 107*  BUN 7 7  CREATININE 1.72* 1.62*  CALCIUM 8.7* 8.7*     Liver Panel   Recent Labs  01/12/17 0601 01/12/17 1600  ALBUMIN 2.6* 2.8*       Sedimentation Rate No results for input(s): ESRSEDRATE in the last 72 hours. C-Reactive Protein No results for input(s): CRP in the last 72 hours.  Micro Results: Recent Results (from the past 720 hour(s))  MRSA PCR Screening     Status: None   Collection Time: 12/27/16  3:22 PM  Result Value Ref Range Status   MRSA by PCR NEGATIVE NEGATIVE Final    Comment:        The GeneXpert MRSA Assay (FDA approved for NASAL specimens only), is one component of a comprehensive MRSA colonization surveillance program. It is not intended to diagnose MRSA infection nor to guide or monitor treatment for MRSA infections.   Culture, blood (Routine X 2) w Reflex to ID Panel     Status: Abnormal   Collection Time: 01/09/17 12:32 AM  Result Value Ref Range Status   Specimen Description BLOOD RIGHT HAND  Final   Special Requests   Final    IN PEDIATRIC BOTTLE Blood Culture adequate volume 3CC   Culture  Setup Time   Final    GRAM POSITIVE COCCI IN PAIRS IN CHAINS IN PEDIATRIC BOTTLE Organism ID to follow CRITICAL RESULT CALLED TO, READ BACK BY AND VERIFIED WITH: A. JOHNSTON, PHARM, 01/09/17 AT 1733 BY J FUDESCO    Culture VANCOMYCIN RESISTANT ENTEROCOCCUS (A)  Final   Report Status 01/11/2017 FINAL  Final   Organism ID, Bacteria VANCOMYCIN RESISTANT ENTEROCOCCUS  Final      Susceptibility   Vancomycin resistant enterococcus - MIC*    AMPICILLIN <=2 SENSITIVE Sensitive     VANCOMYCIN >=32 RESISTANT Resistant     GENTAMICIN SYNERGY SENSITIVE  Sensitive     LINEZOLID 2 SENSITIVE Sensitive     * VANCOMYCIN RESISTANT ENTEROCOCCUS  Blood Culture ID Panel (Reflexed)     Status: Abnormal   Collection Time: 01/09/17 12:32 AM  Result Value Ref Range Status   Enterococcus species DETECTED (A) NOT DETECTED Final    Comment:  CRITICAL RESULT CALLED TO, READ BACK BY AND VERIFIED WITH: A. JOHNSTON, PHARM, 01/09/17, AT 1833 BY J FUDESCO    Vancomycin resistance DETECTED (A) NOT DETECTED Final    Comment: CRITICAL RESULT CALLED TO, READ BACK BY AND VERIFIED WITH: A JOHNSTON, PHARM, 01/09/17 AT 1833 BY J FUDESCO    Listeria monocytogenes NOT DETECTED NOT DETECTED Final   Staphylococcus species NOT DETECTED NOT DETECTED Final   Staphylococcus aureus NOT DETECTED NOT DETECTED Final   Streptococcus species NOT DETECTED NOT DETECTED Final   Streptococcus agalactiae NOT DETECTED NOT DETECTED Final   Streptococcus pneumoniae NOT DETECTED NOT DETECTED Final   Streptococcus pyogenes NOT DETECTED NOT DETECTED Final   Acinetobacter baumannii NOT DETECTED NOT DETECTED Final   Enterobacteriaceae species NOT DETECTED NOT DETECTED Final   Enterobacter cloacae complex NOT DETECTED NOT DETECTED Final   Escherichia coli NOT DETECTED NOT DETECTED Final   Klebsiella oxytoca NOT DETECTED NOT DETECTED Final   Klebsiella pneumoniae NOT DETECTED NOT DETECTED Final   Proteus species NOT DETECTED NOT DETECTED Final   Serratia marcescens NOT DETECTED NOT DETECTED Final   Haemophilus influenzae NOT DETECTED NOT DETECTED Final   Neisseria meningitidis NOT DETECTED NOT DETECTED Final   Pseudomonas aeruginosa NOT DETECTED NOT DETECTED Final   Candida albicans NOT DETECTED NOT DETECTED Final   Candida glabrata NOT DETECTED NOT DETECTED Final   Candida krusei NOT DETECTED NOT DETECTED Final   Candida parapsilosis NOT DETECTED NOT DETECTED Final   Candida tropicalis NOT DETECTED NOT DETECTED Final  Culture, blood (Routine X 2) w Reflex to ID Panel     Status: Abnormal    Collection Time: 01/09/17 12:41 AM  Result Value Ref Range Status   Specimen Description BLOOD LEFT HAND  Final   Special Requests   Final    IN PEDIATRIC BOTTLE Blood Culture adequate volume 3CC   Culture  Setup Time   Final    GRAM POSITIVE COCCI IN CHAINS IN PAIRS CRITICAL VALUE NOTED.  VALUE IS CONSISTENT WITH PREVIOUSLY REPORTED AND CALLED VALUE.    Culture (A)  Final    ENTEROCOCCUS FAECALIS SUSCEPTIBILITIES PERFORMED ON PREVIOUS CULTURE WITHIN THE LAST 5 DAYS.    Report Status 01/11/2017 FINAL  Final  Culture, blood (Routine X 2) w Reflex to ID Panel     Status: None (Preliminary result)   Collection Time: 01/10/17 10:43 AM  Result Value Ref Range Status   Specimen Description BLOOD LEFT ANTECUBITAL  Final   Special Requests IN PEDIATRIC BOTTLE Blood Culture adequate volume  Final   Culture NO GROWTH 2 DAYS  Final   Report Status PENDING  Incomplete  Culture, blood (Routine X 2) w Reflex to ID Panel     Status: None (Preliminary result)   Collection Time: 01/10/17 10:48 AM  Result Value Ref Range Status   Specimen Description BLOOD LEFT ANTECUBITAL  Final   Special Requests IN PEDIATRIC BOTTLE AEROMONAS CAVIAE  Final   Culture NO GROWTH 2 DAYS  Final   Report Status PENDING  Incomplete    Studies/Results: No results found.    Assessment/Plan:  INTERVAL HISTORY:  TEE planned tomorrow if pt is stable  Principal Problem:   VRE bacteremia Active Problems:   Essential hypertension   PAF (paroxysmal atrial fibrillation) (HCC)   Obstructive sleep apnea   Morbid obesity ([place])   Obesity hypoventilation syndrome (HCC)   COPD mixed type (HCC)   Chronic respiratory failure with hypoxia (HCC)   Acute kidney injury superimposed on CKD (  [place])   Acute on chronic combined systolic and diastolic CHF (congestive heart failure) (HCC)   Hyperkalemia   Thoracic aortic atherosclerosis (HCC)   Cardiorenal syndrome   Acute hypoxemic respiratory failure (HCC)   History of  conscious sedation   Acute pulmonary edema (HCC)   Severe sepsis with septic shock ([place])   Hemodialysis catheter infection ([place])    Paige Marshall is a 61 y.o. female with  female with  admission with severe heart failure and cardiorenal syndrome requiring hemodialysis currently on CVVHD, with several lines in place who became febrile and with evidence of sepsis now found to be bacteremic with VRE that is SENSITIVE TO AMP  --continue IV AMP (she has tolerated amox in the past) --TEE would help [place] exclude endocarditis if safe to do --if she has endocarditis and tolerates the AMP would go with AMP and CTX --again a catheter holiday is something she will need to effect cure but not forseeable at this point in near future     LOS: 17 days   Alcide Evener 01/12/2017, 5:57 PM

## 2017-01-12 NOTE — Progress Notes (Signed)
Subjective: Interval History: has no complaint.  Objective: Vital signs in last 24 hours: Temp:  [97.5 F (36.4 C)-98.3 F (36.8 C)] 97.8 F (36.6 C) (04/04 0400) Pulse Rate:  [72-133] 93 (04/04 0700) Resp:  [16-26] 17 (04/04 0700) BP: (75-105)/(55-75) 92/65 (04/04 0700) SpO2:  [89 %-100 %] 100 % (04/04 0700) Arterial Line BP: (97-140)/(42-73) 98/45 (04/04 0700) FiO2 (%):  [50 %] 50 % (04/04 0025) Weight:  [132.9 kg (293 lb)] 132.9 kg (293 lb) (04/04 0500) Weight change: -4.99 kg (-11 lb)  Intake/Output from previous day: 04/03 0701 - 04/04 0700 In: 1815.6 [P.O.:210; I.V.:1419.6; IV Piggyback:186] Out: 4763  Intake/Output this shift: No intake/output data recorded.  General appearance: alert, cooperative, no distress and morbidly obese Resp: diminished breath sounds bilaterally and rales bibasilar Chest wall: RIJ PC Cardio: irregularly irregular rhythm and systolic murmur: holosystolic 2/6, blowing at apex GI: massive, pos bs, soft Extremities: edema 3+  Lab Results:  Recent Labs  01/11/17 0529 01/12/17 0507  WBC 5.1 5.0  HGB 8.3* 8.1*  HCT 27.2* 28.0*  PLT 149* 158   BMET:  Recent Labs  01/11/17 0529 01/12/17 0601  NA 135 136  K 4.9 3.7  CL 99* 101  CO2 27 25  GLUCOSE 86 94  BUN 8 7  CREATININE 1.90* 1.72*  CALCIUM 8.7* 8.7*   No results for input(s): PTH in the last 72 hours. Iron Studies: No results for input(s): IRON, TIBC, TRANSFERRIN, FERRITIN in the last 72 hours.  Studies/Results: Dg Chest Port 1 View  Result Date: 01/10/2017 CLINICAL DATA:  Central line placement. EXAM: PORTABLE CHEST 1 VIEW COMPARISON:  01/09/2017 FINDINGS: The right IJ catheter is stable. There is also a small caliber right-sided PICC line which is stable. New left IJ center venous catheter tip is in the mid SVC with the tip against the lateral wall. No complicating features. Stable cardiac enlargement and diffuse pulmonary edema. No definite pleural effusions. IMPRESSION:  Support apparatus in good position without complicating features. Persistent cardiac enlargement and pulmonary edema. Electronically Signed   By: Marijo Sanes M.D.   On: 01/10/2017 13:09    I have reviewed the patient's current medications.  Assessment/Plan: 1 AKI /CKD no urine.  Good solute/acid/base/K.  Needs phos.  bp ok a this time off NE 2 CM per cards, this is the primary issue 3 VRE on Dapto 4 Massive obesity 5 DM  Controlled 6 Anemia esa/Fe 7 Afib 8 OSA P CRRt, esa, Dapto,    LOS: 17 days   Sharalyn Lomba L 01/12/2017,7:50 AM

## 2017-01-12 NOTE — Progress Notes (Signed)
Paige Bellis, NP notified of wide complex ecg with bundle branch block noted.  Blood pressure and other vital signs are stable. Pt is almost 44 liters negative.  Nephrology ordered to run even over next two hours.  Order received to perform ECG.  Will continue to monitor pt closely.

## 2017-01-13 ENCOUNTER — Inpatient Hospital Stay (HOSPITAL_COMMUNITY): Payer: Medicare Other | Admitting: Anesthesiology

## 2017-01-13 ENCOUNTER — Encounter (HOSPITAL_COMMUNITY): Payer: Self-pay | Admitting: Certified Registered"

## 2017-01-13 ENCOUNTER — Inpatient Hospital Stay (HOSPITAL_COMMUNITY): Payer: Medicare Other

## 2017-01-13 ENCOUNTER — Encounter (HOSPITAL_COMMUNITY)
Admission: EM | Disposition: A | Discharge status: Nursing Home (Includes ICF) | Payer: Self-pay | Source: Home / Self Care | Attending: Pulmonary Disease

## 2017-01-13 DIAGNOSIS — I340 Nonrheumatic mitral (valve) insufficiency: Secondary | ICD-10-CM

## 2017-01-13 DIAGNOSIS — I4891 Unspecified atrial fibrillation: Secondary | ICD-10-CM

## 2017-01-13 HISTORY — PX: CARDIOVERSION: SHX1299

## 2017-01-13 HISTORY — PX: TEE WITHOUT CARDIOVERSION: SHX5443

## 2017-01-13 LAB — RENAL FUNCTION PANEL
ALBUMIN: 2.9 g/dL — AB (ref 3.5–5.0)
Albumin: 2.7 g/dL — ABNORMAL LOW (ref 3.5–5.0)
Anion gap: 8 (ref 5–15)
Anion gap: 8 (ref 5–15)
BUN: 8 mg/dL (ref 6–20)
BUN: 7 mg/dL (ref 6–20)
CALCIUM: 8.6 mg/dL — AB (ref 8.9–10.3)
CHLORIDE: 99 mmol/L — AB (ref 101–111)
CO2: 26 mmol/L (ref 22–32)
CO2: 26 mmol/L (ref 22–32)
CREATININE: 1.57 mg/dL — AB (ref 0.44–1.00)
CREATININE: 1.59 mg/dL — AB (ref 0.44–1.00)
Calcium: 8.9 mg/dL (ref 8.9–10.3)
Chloride: 102 mmol/L (ref 101–111)
GFR calc Af Amer: 41 mL/min — ABNORMAL LOW (ref >60)
GFR calc Af Amer: 40 mL/min — ABNORMAL LOW (ref >60)
GFR calc non Af Amer: 35 mL/min — ABNORMAL LOW (ref >60)
GFR, EST NON AFRICAN AMERICAN: 35 mL/min — AB (ref >60)
Glucose, Bld: 89 mg/dL (ref 65–99)
Glucose, Bld: 109 mg/dL — ABNORMAL HIGH (ref 65–99)
PHOSPHORUS: 2.3 mg/dL — AB (ref 2.5–4.6)
Phosphorus: 2.5 mg/dL (ref 2.5–4.6)
Potassium: 6.2 mmol/L — ABNORMAL HIGH (ref 3.5–5.1)
Potassium: 4.4 mmol/L (ref 3.5–5.1)
Sodium: 133 mmol/L — ABNORMAL LOW (ref 135–145)
Sodium: 136 mmol/L (ref 135–145)

## 2017-01-13 LAB — HEPARIN LEVEL (UNFRACTIONATED)
HEPARIN UNFRACTIONATED: 1.66 [IU]/mL — AB (ref 0.30–0.70)
HEPARIN UNFRACTIONATED: 1.01 [IU]/mL — AB (ref 0.30–0.70)
Heparin Unfractionated: <0.1 IU/mL — ABNORMAL LOW (ref 0.30–0.70)
Heparin Unfractionated: 1.5 IU/mL — ABNORMAL HIGH (ref 0.30–0.70)

## 2017-01-13 LAB — MAGNESIUM: MAGNESIUM: 2.7 mg/dL — AB (ref 1.7–2.4)

## 2017-01-13 LAB — CBC
HEMATOCRIT: 28.4 % — AB (ref 36.0–46.0)
Hemoglobin: 8.5 g/dL — ABNORMAL LOW (ref 12.0–15.0)
MCH: 26.1 pg (ref 26.0–34.0)
MCHC: 29.9 g/dL — AB (ref 30.0–36.0)
MCV: 87.1 fL (ref 78.0–100.0)
Platelets: 237 10*3/uL (ref 150–400)
RBC: 3.26 MIL/uL — ABNORMAL LOW (ref 3.87–5.11)
RDW: 26.5 % — AB (ref 11.5–15.5)
WBC: 5.5 10*3/uL (ref 4.0–10.5)

## 2017-01-13 LAB — POCT I-STAT 4, (NA,K, GLUC, HGB,HCT)
GLUCOSE: 89 mg/dL (ref 65–99)
HCT: 28 % — ABNORMAL LOW (ref 36.0–46.0)
Hemoglobin: 9.5 g/dL — ABNORMAL LOW (ref 12.0–15.0)
POTASSIUM: 6.4 mmol/L — AB (ref 3.5–5.1)
Sodium: 135 mmol/L (ref 135–145)

## 2017-01-13 LAB — POTASSIUM: Potassium: 4.5 mmol/L (ref 3.5–5.1)

## 2017-01-13 SURGERY — ECHOCARDIOGRAM, TRANSESOPHAGEAL
Anesthesia: Monitor Anesthesia Care

## 2017-01-13 MED ORDER — SODIUM CHLORIDE 0.9 % IV SOLN
510.0000 mg | Freq: Once | INTRAVENOUS | Status: DC
  Filled 2017-01-13: qty 17

## 2017-01-13 MED ORDER — PROPOFOL 10 MG/ML IV BOLUS
INTRAVENOUS | Status: DC | PRN
  Administered 2017-01-13 (×2): 30 mg via INTRAVENOUS
  Administered 2017-01-13: 50 mg via INTRAVENOUS
  Administered 2017-01-13 (×2): 30 mg via INTRAVENOUS

## 2017-01-13 MED ORDER — PROPOFOL 10 MG/ML IV BOLUS: INTRAVENOUS | Status: DC | PRN

## 2017-01-13 MED ORDER — HEPARIN (PORCINE) IN NACL 100-0.45 UNIT/ML-% IJ SOLN
2500.0000 [IU]/h | INTRAMUSCULAR | Status: DC
  Administered 2017-01-13: 2900 [IU]/h via INTRAVENOUS
  Administered 2017-01-14 – 2017-01-17 (×8): 2500 [IU]/h via INTRAVENOUS
  Administered 2017-01-18: 2400 [IU]/h via INTRAVENOUS
  Administered 2017-01-18: 2500 [IU]/h via INTRAVENOUS
  Administered 2017-01-19 – 2017-01-20 (×3): 2400 [IU]/h via INTRAVENOUS
  Administered 2017-01-20: 2500 [IU]/h via INTRAVENOUS
  Filled 2017-01-13 (×16): qty 250

## 2017-01-13 MED ORDER — SODIUM CHLORIDE 0.9 % IV SOLN
INTRAVENOUS | Status: DC | PRN
  Administered 2017-01-13: 08:00:00 via INTRAVENOUS

## 2017-01-13 MED ORDER — BUTAMBEN-TETRACAINE-BENZOCAINE 2-2-14 % EX AERO
INHALATION_SPRAY | CUTANEOUS | Status: DC | PRN
  Administered 2017-01-13: 2 via TOPICAL

## 2017-01-13 MED ORDER — SODIUM CHLORIDE 0.9 % IV SOLN
510.0000 mg | Freq: Once | INTRAVENOUS | Status: AC
  Administered 2017-01-13: 510 mg via INTRAVENOUS
  Filled 2017-01-13: qty 17

## 2017-01-13 MED ORDER — DEXTROSE 5 % IV SOLN
2.0000 g | Freq: Two times a day (BID) | INTRAVENOUS | Status: AC
  Administered 2017-01-13 – 2017-01-26 (×28): 2 g via INTRAVENOUS
  Filled 2017-01-13 (×29): qty 2

## 2017-01-13 MED ORDER — SODIUM CHLORIDE 0.9 % IV SOLN: INTRAVENOUS | Status: DC

## 2017-01-13 MED ORDER — PHENYLEPHRINE 40 MCG/ML (10ML) SYRINGE FOR IV PUSH (FOR BLOOD PRESSURE SUPPORT)
PREFILLED_SYRINGE | INTRAVENOUS | Status: DC | PRN
Start: 2017-01-13 — End: 2017-01-13
  Administered 2017-01-13: 40 ug via INTRAVENOUS
  Administered 2017-01-13: 80 ug via INTRAVENOUS

## 2017-01-13 NOTE — Progress Notes (Signed)
Advanced Heart Failure Rounding Note  PCP: EDWARDS, MICHELLE P, NP  Primary Cardiologist: Dr. Gwenlyn Found   Subjective:    Weight down to 295 on CVVHD. Breathing better. Off pressors.    Remains in AF 110-120 despite amio gtt. On heparin dripp. No bleeding.   Denies SOB or orthopnea     Objective:   Weight Range: 133.8 kg (295 lb) Body mass index is 45.52 kg/m.   Vital Signs:   Temp:  [97.5 F (36.4 C)-97.7 F (36.5 C)] 97.6 F (36.4 C) (04/05 0344) Pulse Rate:  [97-138] 117 (04/05 0800) Resp:  [14-27] 20 (04/05 0800) BP: (82-146)/(51-120) 121/79 (04/05 0800) SpO2:  [76 %-100 %] 94 % (04/05 0800) Arterial Line BP: (104-125)/(49-69) 120/64 (04/04 1600) Weight:  [133.8 kg (295 lb)] 133.8 kg (295 lb) (04/05 0500) Last BM Date: 01/12/17  Weight change: Filed Weights   01/11/17 0600 01/12/17 0500 01/13/17 0500  Weight: (!) 137.9 kg (304 lb) 132.9 kg (293 lb) 133.8 kg (295 lb)    Intake/Output:   Intake/Output Summary (Last 24 hours) at 01/13/17 0807 Last data filed at 01/13/17 0800  Gross per 24 hour  Intake          2583.71 ml  Output             4212 ml  Net         -1628.29 ml     Physical Exam: General:  Obese lying in bed. On CVVHD NAD HEENT: normal. anicteric Neck: supple. Thick  Carotids 2+ bilat; no bruitsLIG trialysis Cor: PMI nonpalpable IRR iRR Lungs: clear no wheeze Abdomen: obese soft, nontender, nondistended. No hepatosplenomegaly. No bruits or masses. Good bowel sounds. Extremities: no cyanosis, clubbing, rash, 2+ edema  Neuro: alert & orientedx3, cranial nerves grossly intact. moves all 4 extremities w/o difficulty. Affect pleasant   Telemetry: Afib 110-120, Personally reviewed       Labs: CBC  Recent Labs  01/12/17 0507 01/13/17 0358 01/13/17 0657  WBC 5.0 5.5  --   HGB 8.1* 8.5* 9.5*  HCT 28.0* 28.4* 28.0*  MCV 89.2 87.1  --   PLT 158 237  --    Basic Metabolic Panel  Recent Labs  01/12/17 0601 01/12/17 1600  01/13/17 0528 01/13/17 0657  NA 136 135 133* 135  K 3.7 4.8 6.2* 6.4*  CL 101 99* 99*  --   CO2 25 25 26   --   GLUCOSE 94 107* 89 89  BUN 7 7 8   --   CREATININE 1.72* 1.62* 1.57*  --   CALCIUM 8.7* 8.7* 8.6*  --   MG 2.3  --  2.7*  --   PHOS 2.0* 4.5 2.5  --     BNP: BNP (last 3 results)  Recent Labs  09/18/16 1126 11/07/16 1633 12/26/16 1643  BNP 991.0* 490.4* 1,116.0*    Medications:     Scheduled Medications: . [MAR Hold] ampicillin (OMNIPEN) IV  2 g Intravenous Q8H  . [MAR Hold] bisacodyl  10 mg Oral Daily  . [MAR Hold] Chlorhexidine Gluconate Cloth  6 each Topical Daily  . [MAR Hold] darbepoetin (ARANESP) injection - NON-DIALYSIS  150 mcg Subcutaneous Q Tue-1800  . [MAR Hold] ferumoxytol  510 mg Intravenous Once  . [MAR Hold] mouth rinse  15 mL Mouth Rinse BID  . [MAR Hold] midodrine  2.5 mg Oral TID WC  . [MAR Hold] senna-docusate  2 tablet Oral QHS  . [MAR Hold] sodium chloride flush  3 mL Intravenous Q12H  Infusions: . sodium chloride    . sodium chloride    . amiodarone 30 mg/hr (01/13/17 0800)  . heparin 3,400 Units/hr (01/13/17 0800)  . [MAR Hold] norepinephrine (LEVOPHED) Adult infusion 3 mcg/min (01/13/17 0800)  . dialysis replacement fluid (prismasate) 300 mL/hr at 01/13/17 0236  . dialysis replacement fluid (prismasate) 300 mL/hr at 01/13/17 0240  . dialysate (PRISMASATE) 1,500 mL/hr at 01/13/17 0227    PRN Medications: Place/Maintain arterial line **AND** [MAR Hold] sodium chloride, Place/Maintain arterial line **AND** [MAR Hold] sodium chloride, [MAR Hold] acetaminophen, [MAR Hold] ALPRAZolam, [MAR Hold] diphenhydrAMINE-zinc acetate, [MAR Hold] heparin, [MAR Hold] heparin, [MAR Hold] ondansetron (ZOFRAN) IV, [MAR Hold] oxyCODONE, [MAR Hold] polyethylene glycol, [MAR Hold] promethazine, [MAR Hold] sodium chloride, [MAR Hold] sodium chloride flush, [MAR Hold] sorbitol   Assessment/Plan   1. Acute on chronic diastolic CHF Volume status  managed by with CVVHD. Per Nephrology.  2. Paroxysmal atrial fibrillation on Coumadin    -s/p DC-CV on 3/27 - Continue amio drip. Plan for TEE DC-CV today Remain on heparin drip. Hemoglobin stable.  3. Acute on chronic kidney disease stage IV Remains on CVVHD per Nephrology.  4. Obesity hypoventilation syndrome.  5. Hyperkalemia 6. PAH with acute RV failure/cor pulmonale 7. Acute on chronic respiratory failure 8. Anemia- Hemoglobin 8.1  9. NSVT- K 3.7  10. Septic Shock- Blood CX--> VRE- Repeat Blood CX negative. ID following.  Off norepi. Improved   Volume status stable on CVVHD. Will continue per Renal. Adjust bath to treat hyperkalemia.   BP ok. Can stop midodrine if needed. No co-ox today. Off milrinone.   Remains in AF with RVR despite amio. For TEE and DCCV today. Continue heparin gtt.  On ampicillin for VRE bactermia. TEE today to look for vegetations.    Length of Stay: 16  Advanced Heart Failure Team  Pager [phone] M-F 7am-4pm.  Please contact [facility] for night-coverage after hours (4p -7a ) and weekends on [email or web address]

## 2017-01-13 NOTE — Progress Notes (Signed)
Endoscopy called to verify pt will be getting TEE at bedside this AM. Verified that pt has been kept NPO since midnight, pt is on CRRT.

## 2017-01-13 NOTE — Plan of Care (Signed)
Lab called about drawing heparin levels, first two am levels drawn were hemolysed, third level drawn from red port of HD cath closest to patient per Dr. Jimmy Footman, last level was a stick. Per pharmacist to stop heparin drip for one hour and resume at 2900 units/hour. Level to be rechecked at 2200

## 2017-01-13 NOTE — Progress Notes (Signed)
PULMONARY  / CRITICAL CARE MEDICINE  Name: ELICA ALMAS MRN: [id number] DOB: 1956-07-14    LOS: 85  REFERRING MD :  IMTS  CHIEF COMPLAINT:  Shortness of breath  BRIEF PATIENT DESCRIPTION: Ms. Kretz is a 61 year old woman with poorly controlled OHS, chronic combined systolic and diastolic CHF, a-fib/a-flutter on chronic anticoagulation, hypertension, COPD, CKD Stage 3 hospitalized for acute decompensated CHF complicated by cardiorenal syndrome on CRRT and now E. Faecalis bacteremia.   LINES / TUBES: 3/31 R IJ HD catheter 4/01 R femoral arterial line 4/02 L IJ  CULTURES: 4/01 E. Faecalis [R to vancomycin, S to ampicilin, gentamicin, linezolid] 4/02 Negative  ANTIBIOTICS: 4/01 Vanc/cefepime >> daptomycin 4/03 Daptomycin >> ampicillin  SIGNIFICANT EVENTS:  3/18: Admitted for acute decompensated CHF 3/21: CRRT initiated for diuresis  3/29: Transferred to stepdown  3/31: Developed fever and PCCM re-consulted   DIET:  Heart Healthy with fluid restriction  DVT Px:  Heparin IV  GI Px:  None  HISTORY OF PRESENT ILLNESS:  61 y/o F admitted from rehab facility (resident since November 2017) on 3/18 with complaints of shortness of breath & lower extremity swelling.    The patient has a medical history of morbid obesity / wheelchair bound, untreated OHS, chronic combined systolic / diastolic CHF, NICM, AF on coumadin, HTN, 3L O2 dependent, pulmonary hypertension, former smoker (11 pk years) with COPD & CKD III.  The patient suffered a left fibular fracture in late 2017.  She was recently admitted from 1/28-11/13/16 for acute CHF exacerbation.  At that time, her discharge weight was 370lbs.    She returned from the rehab facility on 3/18 with increased SOB and lower extremity swelling.  She if followed by Dr. Gwenlyn Found for cardiology and was recently switched to lasix and metolazone.  Lasix was discontinued at the SNF due to rising sr cr.  Her admit weight was 411lbs.  She was admitted  by [facility] for decompensated CHF.  She developed worsening AKI with concern for cardio-renal syndrome.  An HD catheter was inserted 3/21 and CVVHD was initiated.   She remains on milrinone, amiodarone and heparin gtt's.  Rate control has been difficult to achieve.  Over the past 24 hours, she is net negative 3.5L.  She developed respiratory distress am of 3/22 and BiPAP was initiated.    Sent to sdu 3/29, returned now ICU , shock, CVVHD needs, fever.  INTERVAL HISTORY:  Awake and interacting She just underwent TEE and cardioversion attempt. Unfortunately she did not stay in sinus rhythm, is still in atrial fibrillation  VITAL SIGNS: Temp:  [97 F (36.1 C)-97.7 F (36.5 C)] 97 F (36.1 C) (04/05 0900) Pulse Rate:  [97-138] 114 (04/05 1100) Resp:  [14-27] 20 (04/05 1100) BP: (80-146)/(54-100) 116/67 (04/05 1100) SpO2:  [90 %-100 %] 99 % (04/05 1100) Arterial Line BP: (104-125)/(54-69) 120/64 (04/04 1600) Weight:  [133.8 kg (295 lb)] 133.8 kg (295 lb) (04/05 0500) HEMODYNAMICS: CVP:  [14 mmHg-17 mmHg] 17 mmHg VENTILATOR SETTINGS:   INTAKE / OUTPUT: Intake/Output      04/04 0701 - 04/05 0700 04/05 0701 - 04/06 0700   P.O. 730 120   I.V. (mL/kg) 1469.6 (11) 237.2 (1.8)   IV Piggyback 408 167   Total Intake(mL/kg) 2607.6 (19.5) 524.2 (3.9)   Urine (mL/kg/hr) 0 (0)    Other 4260 (1.3) 611 (1)   Stool     Blood  0 (0)   Total Output 4260 611   Net -1652.4 -86.8  PHYSICAL EXAMINATION: Physical Exam  Constitutional: She is oriented to person, place, and time. No distress.  HENT:  Head: Normocephalic and atraumatic.  Eyes: Conjunctivae are normal. No scleral icterus.  Neck:  L IJ catheter, R HD catheter  Cardiovascular: Normal rate.   Irregularly irregular, rate 100's  Pulmonary/Chest: Effort normal. No respiratory distress. She has no rales.  Abdominal: Soft. She exhibits no distension.  Neurological: She is alert and oriented to person, place, and time.  Skin: Skin is  warm and dry. She is not diaphoretic.     LABS: Cbc  Recent Labs Lab 01/11/17 0529 01/12/17 0507 01/13/17 0358 01/13/17 0657  WBC 5.1 5.0 5.5  --   HGB 8.3* 8.1* 8.5* 9.5*  HCT 27.2* 28.0* 28.4* 28.0*  PLT 149* 158 237  --     Chemistry   Recent Labs Lab 01/11/17 0529 01/12/17 0601 01/12/17 1600 01/13/17 0528 01/13/17 0657 01/13/17 0731  NA 135 136 135 133* 135  --   K 4.9 3.7 4.8 6.2* 6.4* 4.5  CL 99* 101 99* 99*  --   --   CO2 27 25 25 26   --   --   BUN 8 7 7 8   --   --   CREATININE 1.90* 1.72* 1.62* 1.57*  --   --   CALCIUM 8.7* 8.7* 8.7* 8.6*  --   --   MG 2.3 2.3  --  2.7*  --   --   PHOS 1.6* 2.0* 4.5 2.5  --   --   GLUCOSE 86 94 107* 89 89  --     Liver fxn  Recent Labs Lab 01/08/17 2142  01/12/17 0601 01/12/17 1600 01/13/17 0528  AST 22  --   --   --   --   ALT 17  --   --   --   --   ALKPHOS 64  --   --   --   --   BILITOT 2.0*  --   --   --   --   PROT 5.8*  --   --   --   --   ALBUMIN 2.7*  < > 2.6* 2.8* 2.7*  < > = values in this interval not displayed. coags  Recent Labs Lab 01/07/17 0941  INR 1.50   Cardiac markers  Recent Labs Lab 01/10/17 0515  CKTOTAL 27*   ABG  Recent Labs Lab 01/07/17 1100 01/08/17 2305  PHART  --  7.440  PCO2ART  --  41.2  PO2ART  --  51.7*  HCO3 24.0  25.0 27.5  TCO2 25  26  --     CBG trend No results for input(s): GLUCAP in the last 168 hours.  DIAGNOSES: Principal Problem:   VRE bacteremia Active Problems:   Essential hypertension   PAF (paroxysmal atrial fibrillation) (HCC)   Obstructive sleep apnea   Morbid obesity (HCC)   Obesity hypoventilation syndrome (HCC)   COPD mixed type (HCC)   Chronic respiratory failure with hypoxia (HCC)   Acute kidney injury superimposed on CKD (HCC)   Acute on chronic combined systolic and diastolic CHF (congestive heart failure) (HCC)   Hyperkalemia   Thoracic aortic atherosclerosis (HCC)   Cardiorenal syndrome   Acute hypoxemic  respiratory failure (HCC)   History of conscious sedation   Acute pulmonary edema (HCC)   Severe sepsis with septic shock (HCC)   Hemodialysis catheter infection ([place])   PAH (pulmonary artery hypertension)   ASSESSMENT / PLAN:  PULMONARY  ASSESSMENT: Acute on chronic hypoxemic hypercapneic respiratory failure OSA/OHS History of COPD  PLAN:   Continue good compliance with CPAP daily at bedtime  CARDIOVASCULAR  ASSESSMENT:  Acute on chronic combined diastolic CHF with volume overload A-fib/a-flutter on chronic anticoagulation Hypertension  PLAN:  Continue amiodarone Midodrin and norepinephrine as ordered Heparin drip Failed cardioversion 4/5 but no evidence of endocarditis on her TEE  RENAL  ASSESSMENT:  Acute on chronic renal failure Stage IV  PLAN:   Continue CVVHD and volume removal Do not expect that she will get renal recovery, will need transitioned to intermittent hemodialysis at some point Follow serial BMP   HEMATOLOGIC  ASSESSMENT:   Normocytic anemia: Hb at baseline 7-8. Suspect chronic renal insufficiency.  PLAN:  Follow CBC   INFECTIOUS  ASSESSMENT:   E. Faecalis bacteremia / VRE: Sensitive to ampicillin. Follow cultures without growth which is reassuring.  PLAN:   Appreciate ID assistance, continue ampicillin Follow repeat cultures. If negative then we should be able to keep her tunneled hemodialysis catheter No evidence of endocarditis on TEE 4/5   NEUROLOGIC  ASSESSMENT:   Anxiety  PLAN:   Xanax as needed   CLINICAL SUMMARY: Ms. Whittenberg is a 61 year old woman with poorly controlled OHS, chronic combined systolic and diastolic CHF, a-fib/a-flutter on chronic anticoagulation, hypertension, COPD, CKD Stage 3 hospitalized for acute decompensated CHF complicated by cardiorenal syndrome on CRRT and now E. Faecalis bacteremia.   Independent critical care time is 31 minutes.   Baltazar Apo, MD, PhD 01/13/2017, 11:39 AM [place]  [facility] [phone] or if no answer [phone]

## 2017-01-13 NOTE — Progress Notes (Signed)
Subjective:  No new complaints  Antibiotics:  Anti-infectives    Start     Dose/Rate Route Frequency Ordered Stop   01/13/17 1100  cefTRIAXone (ROCEPHIN) 2 g in dextrose 5 % 50 mL IVPB     2 g 100 mL/hr over 30 Minutes Intravenous Every 12 hours 01/13/17 1056     01/11/17 2000  DAPTOmycin (CUBICIN) 1,000 mg in sodium chloride 0.9 % IVPB  Status:  Discontinued     1,000 mg 240 mL/hr over 30 Minutes Intravenous  Once 01/10/17 1249 01/11/17 1000   01/11/17 1600  ampicillin (OMNIPEN) 2 g in sodium chloride 0.9 % 50 mL IVPB     2 g 150 mL/hr over 20 Minutes Intravenous Every 8 hours 01/11/17 1000     01/09/17 2200  ceFEPIme (MAXIPIME) 1 g in dextrose 5 % 50 mL IVPB  Status:  Discontinued     1 g 100 mL/hr over 30 Minutes Intravenous Every 24 hours 01/09/17 0033 01/09/17 1118   01/09/17 2200  ceFEPIme (MAXIPIME) 2 g in dextrose 5 % 50 mL IVPB  Status:  Discontinued     2 g 100 mL/hr over 30 Minutes Intravenous Every 24 hours 01/09/17 1118 01/09/17 1130   01/09/17 2200  ceFEPIme (MAXIPIME) 2 g in dextrose 5 % 50 mL IVPB  Status:  Discontinued     2 g 100 mL/hr over 30 Minutes Intravenous Every 12 hours 01/09/17 1130 01/09/17 1914   01/09/17 1900  DAPTOmycin (CUBICIN) 1,000 mg in sodium chloride 0.9 % IVPB     1,000 mg 240 mL/hr over 30 Minutes Intravenous  Once 01/09/17 1852 01/09/17 2013   01/09/17 0045  vancomycin (VANCOCIN) 2,000 mg in sodium chloride 0.9 % 500 mL IVPB     2,000 mg 250 mL/hr over 120 Minutes Intravenous  Once 01/09/17 0033 01/09/17 0318   01/09/17 0045  ceFEPIme (MAXIPIME) 2 g in dextrose 5 % 50 mL IVPB     2 g 100 mL/hr over 30 Minutes Intravenous  Once 01/09/17 0033 01/09/17 0149   01/06/17 0545  vancomycin (VANCOCIN) 1,500 mg in sodium chloride 0.9 % 500 mL IVPB     1,500 mg 250 mL/hr over 120 Minutes Intravenous To Radiology 01/06/17 0533 01/06/17 1805      Medications: Scheduled Meds: . ampicillin (OMNIPEN) IV  2 g Intravenous Q8H  .  bisacodyl  10 mg Oral Daily  . cefTRIAXone (ROCEPHIN)  IV  2 g Intravenous Q12H  . Chlorhexidine Gluconate Cloth  6 each Topical Daily  . darbepoetin (ARANESP) injection - NON-DIALYSIS  150 mcg Subcutaneous Q Tue-1800  . mouth rinse  15 mL Mouth Rinse BID  . midodrine  2.5 mg Oral TID WC  . senna-docusate  2 tablet Oral QHS  . sodium chloride flush  3 mL Intravenous Q12H   Continuous Infusions: . sodium chloride    . amiodarone 30 mg/hr (01/13/17 1200)  . heparin 3,400 Units/hr (01/13/17 1200)  . norepinephrine (LEVOPHED) Adult infusion 3 mcg/min (01/13/17 1200)  . dialysis replacement fluid (prismasate) 300 mL/hr at 01/13/17 0236  . dialysis replacement fluid (prismasate) 300 mL/hr at 01/13/17 0240  . dialysate (PRISMASATE) 1,500 mL/hr at 01/13/17 0900   PRN Meds:.Place/Maintain arterial line **AND** sodium chloride, Place/Maintain arterial line **AND** sodium chloride, acetaminophen, ALPRAZolam, diphenhydrAMINE-zinc acetate, heparin, heparin, ondansetron (ZOFRAN) IV, oxyCODONE, polyethylene glycol, promethazine, sodium chloride, sodium chloride flush, sorbitol    Objective: Weight change: 2 lb (0.907 kg)  Intake/Output Summary (Last 24  hours) at 01/13/17 1253 Last data filed at 01/13/17 1200  Gross per 24 hour  Intake          2341.17 ml  Output             3824 ml  Net         -1482.83 ml   Blood pressure 93/78, pulse (!) 120, temperature 97 F (36.1 C), temperature source Oral, resp. rate (!) 22, height 5' 7.5" (1.715 m), weight 295 lb (133.8 kg), last menstrual period 09/07/2011, SpO2 98 %. Temp:  [97 F (36.1 C)-97.7 F (36.5 C)] 97 F (36.1 C) (04/05 0900) Pulse Rate:  [97-138] 120 (04/05 1230) Resp:  [14-32] 22 (04/05 1230) BP: (80-146)/(54-100) 93/78 (04/05 1230) SpO2:  [90 %-100 %] 98 % (04/05 1230) Arterial Line BP: (104-125)/(54-69) 120/64 (04/04 1600) Weight:  [295 lb (133.8 kg)] 295 lb (133.8 kg) (04/05 0500)  Physical Exam: General: Alert and awake,  oriented x3, not in any acute distress. HEENT: anicteric sclera, pupils reactive to light and accommodation, EOMI CVS regular rate, normal r,  no murmur rubs or gallops Chest: farily clear to auscultation bilaterally, no wheezing, rales or rhonchi Abdomen: soft nontender, distended, normal bowel sounds, Extremities: 2+ edema Skin: no rashes  Neuro: nonfocal CBC:  CBC Latest Ref Rng & Units 01/13/2017 01/13/2017 01/12/2017  WBC 4.0 - 10.5 K/uL - 5.5 5.0  Hemoglobin 12.0 - 15.0 g/dL 9.5(L) 8.5(L) 8.1(L)  Hematocrit 36.0 - 46.0 % 28.0(L) 28.4(L) 28.0(L)  Platelets 150 - 400 K/uL - 237 158    BMET  Recent Labs  01/12/17 1600 01/13/17 0528 01/13/17 0657 01/13/17 0731  NA 135 133* 135  --   K 4.8 6.2* 6.4* 4.5  CL 99* 99*  --   --   CO2 25 26  --   --   GLUCOSE 107* 89 89  --   BUN 7 8  --   --   CREATININE 1.62* 1.57*  --   --   CALCIUM 8.7* 8.6*  --   --      Liver Panel   Recent Labs  01/12/17 1600 01/13/17 0528  ALBUMIN 2.8* 2.7*       Sedimentation Rate No results for input(s): ESRSEDRATE in the last 72 hours. C-Reactive Protein No results for input(s): CRP in the last 72 hours.  Micro Results: Recent Results (from the past 720 hour(s))  MRSA PCR Screening     Status: None   Collection Time: 12/27/16  3:22 PM  Result Value Ref Range Status   MRSA by PCR NEGATIVE NEGATIVE Final    Comment:        The GeneXpert MRSA Assay (FDA approved for NASAL specimens only), is one component of a comprehensive MRSA colonization surveillance program. It is not intended to diagnose MRSA infection nor to guide or monitor treatment for MRSA infections.   Culture, blood (Routine X 2) w Reflex to ID Panel     Status: Abnormal   Collection Time: 01/09/17 12:32 AM  Result Value Ref Range Status   Specimen Description BLOOD RIGHT HAND  Final   Special Requests   Final    IN PEDIATRIC BOTTLE Blood Culture adequate volume 3CC   Culture  Setup Time   Final    GRAM  POSITIVE COCCI IN PAIRS IN CHAINS IN PEDIATRIC BOTTLE Organism ID to follow CRITICAL RESULT CALLED TO, READ BACK BY AND VERIFIED WITH: A. JOHNSTON, PHARM, 01/09/17 AT 1733 BY J FUDESCO    Culture VANCOMYCIN  RESISTANT ENTEROCOCCUS (A)  Final   Report Status 01/11/2017 FINAL  Final   Organism ID, Bacteria VANCOMYCIN RESISTANT ENTEROCOCCUS  Final      Susceptibility   Vancomycin resistant enterococcus - MIC*    AMPICILLIN <=2 SENSITIVE Sensitive     VANCOMYCIN >=32 RESISTANT Resistant     GENTAMICIN SYNERGY SENSITIVE Sensitive     LINEZOLID 2 SENSITIVE Sensitive     * VANCOMYCIN RESISTANT ENTEROCOCCUS  Blood Culture ID Panel (Reflexed)     Status: Abnormal   Collection Time: 01/09/17 12:32 AM  Result Value Ref Range Status   Enterococcus species DETECTED (A) NOT DETECTED Final    Comment: CRITICAL RESULT CALLED TO, READ BACK BY AND VERIFIED WITH: A. JOHNSTON, PHARM, 01/09/17, AT 1833 BY J FUDESCO    Vancomycin resistance DETECTED (A) NOT DETECTED Final    Comment: CRITICAL RESULT CALLED TO, READ BACK BY AND VERIFIED WITH: A JOHNSTON, PHARM, 01/09/17 AT 1833 BY J FUDESCO    Listeria monocytogenes NOT DETECTED NOT DETECTED Final   Staphylococcus species NOT DETECTED NOT DETECTED Final   Staphylococcus aureus NOT DETECTED NOT DETECTED Final   Streptococcus species NOT DETECTED NOT DETECTED Final   Streptococcus agalactiae NOT DETECTED NOT DETECTED Final   Streptococcus pneumoniae NOT DETECTED NOT DETECTED Final   Streptococcus pyogenes NOT DETECTED NOT DETECTED Final   Acinetobacter baumannii NOT DETECTED NOT DETECTED Final   Enterobacteriaceae species NOT DETECTED NOT DETECTED Final   Enterobacter cloacae complex NOT DETECTED NOT DETECTED Final   Escherichia coli NOT DETECTED NOT DETECTED Final   Klebsiella oxytoca NOT DETECTED NOT DETECTED Final   Klebsiella pneumoniae NOT DETECTED NOT DETECTED Final   Proteus species NOT DETECTED NOT DETECTED Final   Serratia marcescens NOT DETECTED  NOT DETECTED Final   Haemophilus influenzae NOT DETECTED NOT DETECTED Final   Neisseria meningitidis NOT DETECTED NOT DETECTED Final   Pseudomonas aeruginosa NOT DETECTED NOT DETECTED Final   Candida albicans NOT DETECTED NOT DETECTED Final   Candida glabrata NOT DETECTED NOT DETECTED Final   Candida krusei NOT DETECTED NOT DETECTED Final   Candida parapsilosis NOT DETECTED NOT DETECTED Final   Candida tropicalis NOT DETECTED NOT DETECTED Final  Culture, blood (Routine X 2) w Reflex to ID Panel     Status: Abnormal   Collection Time: 01/09/17 12:41 AM  Result Value Ref Range Status   Specimen Description BLOOD LEFT HAND  Final   Special Requests   Final    IN PEDIATRIC BOTTLE Blood Culture adequate volume 3CC   Culture  Setup Time   Final    GRAM POSITIVE COCCI IN CHAINS IN PAIRS CRITICAL VALUE NOTED.  VALUE IS CONSISTENT WITH PREVIOUSLY REPORTED AND CALLED VALUE.    Culture (A)  Final    ENTEROCOCCUS FAECALIS SUSCEPTIBILITIES PERFORMED ON PREVIOUS CULTURE WITHIN THE LAST 5 DAYS.    Report Status 01/11/2017 FINAL  Final  Culture, blood (Routine X 2) w Reflex to ID Panel     Status: None (Preliminary result)   Collection Time: 01/10/17 10:43 AM  Result Value Ref Range Status   Specimen Description BLOOD LEFT ANTECUBITAL  Final   Special Requests IN PEDIATRIC BOTTLE Blood Culture adequate volume  Final   Culture NO GROWTH 2 DAYS  Final   Report Status PENDING  Incomplete  Culture, blood (Routine X 2) w Reflex to ID Panel     Status: None (Preliminary result)   Collection Time: 01/10/17 10:48 AM  Result Value Ref Range Status   Specimen  Description BLOOD LEFT ANTECUBITAL  Final   Special Requests IN PEDIATRIC BOTTLE AEROMONAS CAVIAE  Final   Culture NO GROWTH 2 DAYS  Final   Report Status PENDING  Incomplete    Studies/Results: No results found.    Assessment/Plan:  INTERVAL HISTORY:  TEE NEGATIVE for vegetations  Principal Problem:   VRE bacteremia Active  Problems:   Essential hypertension   PAF (paroxysmal atrial fibrillation) (HCC)   Obstructive sleep apnea   Morbid obesity (HCC)   Obesity hypoventilation syndrome (HCC)   COPD mixed type (HCC)   Chronic respiratory failure with hypoxia (HCC)   Acute kidney injury superimposed on CKD (HCC)   Acute on chronic combined systolic and diastolic CHF (congestive heart failure) (HCC)   Hyperkalemia   Thoracic aortic atherosclerosis (HCC)   Cardiorenal syndrome   Acute hypoxemic respiratory failure (HCC)   History of conscious sedation   Acute pulmonary edema (HCC)   Severe sepsis with septic shock ([place])   Hemodialysis catheter infection ([place])   PAH (pulmonary artery hypertension)    Paige Marshall is a 61 y.o. female with  female with  admission with severe heart failure and cardiorenal syndrome requiring hemodialysis currently on CVVHD, with several lines in place who became febrile and with evidence of sepsis now found to be bacteremic with VRE that is SENSITIVE TO AMP. TEE is clean. She still does have HD line, and new central line  --continue IV AMP and add Ceftriaxone 2 grams q 12 to give her an aggressive regimen for Enterococcus  GIVEN that it seems highly unlikely that she can have a "catheter holiday" free from ALL central lines any time soon  I would instead try to treat her with AMP and CTX IV x 2 weeks through February 18th.  I would then check surveillance blood cultures 2 weeks after thaton May 2nd or later  I will put in OPAT consult on chance she eventually is DC but I suspect she will still be in ICU and hospital for some time to come  I will sign off for now  Please call with further questions.      LOS: 18 days   Alcide Evener 01/13/2017, 12:53 PM

## 2017-01-13 NOTE — Transfer of Care (Signed)
Immediate Anesthesia Transfer of Care Note  Patient: Paige Marshall  Procedure(s) Performed: Procedure(s): TRANSESOPHAGEAL ECHOCARDIOGRAM (TEE) (N/A) CARDIOVERSION (N/A)  Patient Location: 2 Heart .  Anesthesia Type:MAC  Level of Consciousness: awake, oriented, sedated, patient cooperative and responds to stimulation  Airway & Oxygen Therapy: Patient Spontanous Breathing and Patient connected to nasal cannula oxygen  Post-op Assessment: Report given to RN, Post -op Vital signs reviewed and stable, Patient moving all extremities and Patient moving all extremities X 4  Post vital signs: Reviewed and stable  Last Vitals:  Vitals:   01/13/17 0838 01/13/17 0840  BP: 125/74 125/74  Pulse:    Resp:    Temp:      Last Pain:  Vitals:   01/13/17 0400  TempSrc:   PainSc: Asleep      Patients Stated Pain Goal: 2 (18/56/31 4970)  Complications: No apparent anesthesia complications

## 2017-01-13 NOTE — Progress Notes (Signed)
ANTICOAGULATION CONSULT NOTE - Follow Up Consult  Pharmacy Consult for Heparin  Indication: atrial fibrillation  Patient Measurements: Height: 5' 7.5" (171.5 cm) Weight: 295 lb (133.8 kg) IBW/kg (Calculated) : 62.75  Vital Signs: Temp: 97.6 F (36.4 C) (04/05 1254) Temp Source: Oral (04/05 1254) BP: 109/73 (04/05 1330) Pulse Rate: 120 (04/05 1230)  Labs:  Recent Labs  01/11/17 0529  01/12/17 0507 01/12/17 0601 01/12/17 1600 01/13/17 0358 01/13/17 0528 01/13/17 0657 01/13/17 0719 01/13/17 1111  HGB 8.3*  --  8.1*  --   --  8.5*  --  9.5*  --   --   HCT 27.2*  --  28.0*  --   --  28.4*  --  28.0*  --   --   PLT 149*  --  158  --   --  237  --   --   --   --   HEPARINUNFRC 0.52  < >  --   --   --  <0.10*  --   --  1.66* 1.50*  CREATININE 1.90*  --   --  1.72* 1.62*  --  1.57*  --   --   --   < > = values in this interval not displayed.  Estimated Creatinine Clearance: 56.2 mL/min (A) (by C-G formula based on SCr of 1.57 mg/dL (H)).  Assessment: 60yof on coumadin pta for afib, admitted with volume overload and renal failure. INR 2.84 on admit and coumadin initially resumed (received one dose of 5mg ). On 3/20, coumadin held, and she received 5mg  vitamin k to reverse INR w/need for CRRT. Heparin bridge started.  Heparin resumed s/p tunneled HD cath placement 3/29. Heparin has been at goal on 3400 units/hr, but this morning level undetectable, then lysed and resulted very high. Ordered peripheral stick with also resulted high at 1.5. No bleeding or IV issues noted per nurse.   CBC stable. Will hold infusion x 1 hour then restart at lower rate.  Goal of Therapy:  Heparin level 0.3-0.7 units/ml Monitor platelets by anticoagulation protocol: Yes   Plan:  Resume heparin gtt at 2900 unit/hr Daily heparin level/CBC Monitor for s/sx of bleeding Follow up when able to resume coumadin  Erin Hearing PharmD., BCPS Clinical Pharmacist Pager [phone] 01/13/2017 1:39 PM

## 2017-01-13 NOTE — Anesthesia Postprocedure Evaluation (Signed)
Anesthesia Post Note  Patient: Paige Marshall  Procedure(s) Performed: Procedure(s) (LRB): TRANSESOPHAGEAL ECHOCARDIOGRAM (TEE) (N/A) CARDIOVERSION (N/A)  Patient location during evaluation: ICU Anesthesia Type: MAC Level of consciousness: awake and alert Pain management: pain level controlled Vital Signs Assessment: post-procedure vital signs reviewed and stable Respiratory status: spontaneous breathing, nonlabored ventilation, respiratory function stable and patient connected to nasal cannula oxygen Cardiovascular status: stable and blood pressure returned to baseline Anesthetic complications: no       Last Vitals:  Vitals:   01/13/17 0930 01/13/17 1000  BP: 115/61 108/85  Pulse: (!) 118 (!) 128  Resp: (!) 24 20  Temp:      Last Pain:  Vitals:   01/13/17 0900  TempSrc: Oral  PainSc:                  Catalina Gravel

## 2017-01-13 NOTE — CV Procedure (Signed)
    TRANSESOPHAGEAL ECHOCARDIOGRAM GUIDED DIRECT CURRENT CARDIOVERSION  NAME:  Paige Marshall   MRN: [id number] DOB:  13-Jun-1956   ADMIT DATE: 12/26/2016  INDICATIONS:  AF with RVR; Bacteremia  PROCEDURE:   Informed consent was obtained prior to the procedure. The risks, benefits and alternatives for the procedure were discussed and the patient comprehended these risks.  Risks include, but are not limited to, cough, sore throat, vomiting, nausea, somnolence, esophageal and stomach trauma or perforation, bleeding, low blood pressure, aspiration, pneumonia, infection, trauma to the teeth and death.    After a procedural time-out, the patient was sedated by the anesthesia team. The transesophageal probe was inserted in the esophagus and stomach without difficulty and multiple views were obtained.      FINDINGS:  LEFT VENTRICLE: EF = 25-30% Global HK  RIGHT VENTRICLE: Dilated. Moderate to severe HK   LEFT ATRIUM: Dilated  LEFT ATRIAL APPENDAGE: No clot  RIGHT ATRIUM: Massively dilated  AORTIC VALVE:  Trileaflet. Trivial AI. No vegetation   MITRAL VALVE:  Mild to moderate MR. No vegetation  TRICUSPID VALVE: Severe TR. No vegetation   PULMONIC VALVE: Trivial PR. No vegetation  INTERATRIAL SEPTUM: Bows right to left. No PFO/ASD  PERICARDIUM: No effusion  DESCENDING AORTA: Not visualized   CARDIOVERSION:     Indications:  Atrial Fibrillation  Procedure Details:  Once the TEE was complete, the patient had the defibrillator pads placed in the anterior and posterior position. The patient then underwent further sedation by the anesthesia service for cardioversion. Once an appropriate level of sedation was achieved, the patient received three separate synchronized 200J shocks. Remained in AF.  CONCLUSION:   1.  No TEE evidence of endocarditis 2. Failed attempt at [facility] for AF with RVR  Terryn Redner, Quillian Quince, MD  8:38 AM

## 2017-01-13 NOTE — Progress Notes (Signed)
Subjective: Interval History: has no complaint .  Objective: Vital signs in last 24 hours: Temp:  [97.5 F (36.4 C)-97.7 F (36.5 C)] 97.6 F (36.4 C) (04/05 0344) Pulse Rate:  [97-138] 110 (04/05 0700) Resp:  [14-27] 18 (04/05 0700) BP: (82-146)/(51-120) 113/74 (04/05 0700) SpO2:  [90 %-100 %] 98 % (04/05 0700) Arterial Line BP: (104-125)/(49-69) 120/64 (04/04 1600) Weight:  [133.8 kg (295 lb)] 133.8 kg (295 lb) (04/05 0500) Weight change: 0.907 kg (2 lb)  Intake/Output from previous day: 04/04 0701 - 04/05 0700 In: 2607.6 [P.O.:730; I.V.:1469.6; IV Piggyback:408] Out: 4260  Intake/Output this shift: No intake/output data recorded.  General appearance: alert, cooperative, no distress and morbidly obese Resp: diminished breath sounds bilaterally Chest wall: R PC Cardio: irregularly irregular rhythm and systolic murmur: holosystolic 2/6, decrescendo at 2nd left intercostal space GI: obese, pos bs,  Extremities: edema 3+  Lab Results:  Recent Labs  01/12/17 0507 01/13/17 0358 01/13/17 0657  WBC 5.0 5.5  --   HGB 8.1* 8.5* 9.5*  HCT 28.0* 28.4* 28.0*  PLT 158 237  --    BMET:  Recent Labs  01/12/17 1600 01/13/17 0528 01/13/17 0657  NA 135 133* 135  K 4.8 6.2* 6.4*  CL 99* 99*  --   CO2 25 26  --   GLUCOSE 107* 89 89  BUN 7 8  --   CREATININE 1.62* 1.57*  --   CALCIUM 8.7* 8.6*  --    No results for input(s): PTH in the last 72 hours. Iron Studies:  Recent Labs  01/12/17 0800  IRON 48  TIBC 284    Studies/Results: No results found.  I have reviewed the patient's current medications.  Assessment/Plan: 1 AKI.CKD oliguric.  Vol xs. K?? Repeat from art on venous cath, adjust bath if needed, and RF. Acid/base/P ok 2 DM 3 Massive obesity 4 CM per cards 5 Afib rate controlled.  6 anemia esa/Fe needed. 7 hPTH 8 VRE P HD, esa, Fe, AB, control DM, recheck K, TEE    LOS: 18 days   Jozlynn Plaia L 01/13/2017,7:38 AM

## 2017-01-14 LAB — CBC
HEMATOCRIT: 30.1 % — AB (ref 36.0–46.0)
HEMOGLOBIN: 8.6 g/dL — AB (ref 12.0–15.0)
MCH: 25.9 pg — AB (ref 26.0–34.0)
MCHC: 28.6 g/dL — ABNORMAL LOW (ref 30.0–36.0)
MCV: 90.7 fL (ref 78.0–100.0)
Platelets: 197 10*3/uL (ref 150–400)
RBC: 3.32 MIL/uL — ABNORMAL LOW (ref 3.87–5.11)
RDW: 25 % — ABNORMAL HIGH (ref 11.5–15.5)
WBC: 5.3 10*3/uL (ref 4.0–10.5)

## 2017-01-14 LAB — RENAL FUNCTION PANEL
ALBUMIN: 2.6 g/dL — AB (ref 3.5–5.0)
ALBUMIN: 2.9 g/dL — AB (ref 3.5–5.0)
ANION GAP: 11 (ref 5–15)
ANION GAP: 14 (ref 5–15)
BUN: <5 mg/dL — ABNORMAL LOW (ref 6–20)
BUN: 6 mg/dL (ref 6–20)
CALCIUM: 9.1 mg/dL (ref 8.9–10.3)
CO2: 27 mmol/L (ref 22–32)
CO2: 25 mmol/L (ref 22–32)
Calcium: 9.1 mg/dL (ref 8.9–10.3)
Chloride: 99 mmol/L — ABNORMAL LOW (ref 101–111)
Chloride: 98 mmol/L — ABNORMAL LOW (ref 101–111)
Creatinine, Ser: 1.54 mg/dL — ABNORMAL HIGH (ref 0.44–1.00)
Creatinine, Ser: 1.63 mg/dL — ABNORMAL HIGH (ref 0.44–1.00)
GFR calc Af Amer: 42 mL/min — ABNORMAL LOW (ref >60)
GFR calc Af Amer: 39 mL/min — ABNORMAL LOW (ref >60)
GFR calc non Af Amer: 36 mL/min — ABNORMAL LOW (ref >60)
GFR, EST NON AFRICAN AMERICAN: 34 mL/min — AB (ref >60)
GLUCOSE: 93 mg/dL (ref 65–99)
Glucose, Bld: 89 mg/dL (ref 65–99)
PHOSPHORUS: 2.4 mg/dL — AB (ref 2.5–4.6)
POTASSIUM: 4.2 mmol/L (ref 3.5–5.1)
POTASSIUM: 4.6 mmol/L (ref 3.5–5.1)
Phosphorus: 3.5 mg/dL (ref 2.5–4.6)
SODIUM: 137 mmol/L (ref 135–145)
SODIUM: 137 mmol/L (ref 135–145)

## 2017-01-14 LAB — HEPARIN LEVEL (UNFRACTIONATED)
Heparin Unfractionated: 0.7 IU/mL (ref 0.30–0.70)
Heparin Unfractionated: 0.67 IU/mL (ref 0.30–0.70)

## 2017-01-14 LAB — MAGNESIUM: Magnesium: 2.6 mg/dL — ABNORMAL HIGH (ref 1.7–2.4)

## 2017-01-14 MED ORDER — SODIUM CHLORIDE 0.9% FLUSH
10.0000 mL | Freq: Two times a day (BID) | INTRAVENOUS | Status: DC
  Administered 2017-01-14: 10 mL

## 2017-01-14 MED ORDER — SODIUM CHLORIDE 0.9% FLUSH: 10.0000 mL | INTRAVENOUS | Status: DC | PRN

## 2017-01-14 MED ORDER — DEXTROSE 5 % IV SOLN
30.0000 mmol | Freq: Once | INTRAVENOUS | Status: AC
  Administered 2017-01-14: 30 mmol via INTRAVENOUS
  Filled 2017-01-14: qty 10

## 2017-01-14 MED ORDER — SODIUM CHLORIDE 0.9% FLUSH
10.0000 mL | Freq: Two times a day (BID) | INTRAVENOUS | Status: DC
  Administered 2017-01-15 – 2017-01-18 (×5): 10 mL
  Administered 2017-01-19: 20 mL
  Administered 2017-01-19: 10 mL

## 2017-01-14 MED ORDER — SODIUM CHLORIDE 0.9% FLUSH
3.0000 mL | Freq: Two times a day (BID) | INTRAVENOUS | Status: DC
  Administered 2017-01-15: 3 mL via INTRAVENOUS

## 2017-01-14 MED ORDER — OXYMETAZOLINE HCL 0.05 % NA SOLN
2.0000 | Freq: Two times a day (BID) | NASAL | Status: AC | PRN
  Administered 2017-01-15 – 2017-01-17 (×4): 2 via NASAL
  Filled 2017-01-14: qty 15

## 2017-01-14 MED ORDER — MIDODRINE HCL 5 MG PO TABS
10.0000 mg | ORAL_TABLET | Freq: Three times a day (TID) | ORAL | Status: DC
  Administered 2017-01-14 – 2017-01-30 (×45): 10 mg via ORAL
  Filled 2017-01-14 (×45): qty 2

## 2017-01-14 NOTE — Progress Notes (Signed)
Subjective: Interval History: has no complaint .  Objective: Vital signs in last 24 hours: Temp:  [97 F (36.1 C)-98.4 F (36.9 C)] 97.5 F (36.4 C) (04/06 0353) Pulse Rate:  [76-164] 105 (04/06 0700) Resp:  [12-32] 14 (04/06 0700) BP: (75-127)/(48-100) 92/60 (04/06 0700) SpO2:  [74 %-100 %] 99 % (04/06 0700) Weight:  [132.9 kg (293 lb)] 132.9 kg (293 lb) (04/06 0500) Weight change: -0.907 kg (-2 lb)  Intake/Output from previous day: 04/05 0701 - 04/06 0700 In: 2165.1 [P.O.:580; I.V.:1218.1; IV Piggyback:367] Out: 9622  Intake/Output this shift: No intake/output data recorded.  General appearance: alert, cooperative, no distress and morbidly obese Resp: diminished breath sounds bilaterally and rales bibasilar Chest wall: RIJ cath Cardio: regular rate and rhythm, S1, S2 normal and systolic murmur: holosystolic 3/6, blowing at apex GI: pos bs, massive, Extremities: edema 2+  Lab Results:  Recent Labs  01/13/17 0358 01/13/17 0657 01/14/17 0408  WBC 5.5  --  5.3  HGB 8.5* 9.5* 8.6*  HCT 28.4* 28.0* 30.1*  PLT 237  --  197   BMET:  Recent Labs  01/13/17 1457 01/14/17 0408  NA 136 137  K 4.4 4.2  CL 102 99*  CO2 26 27  GLUCOSE 109* 89  BUN 7 <5*  CREATININE 1.59* 1.54*  CALCIUM 8.9 9.1   No results for input(s): PTH in the last 72 hours. Iron Studies:  Recent Labs  01/12/17 0800  IRON 48  TIBC 284    Studies/Results: No results found.  I have reviewed the patient's current medications.  Assessment/Plan: 1 AKI/CKD  Oliguric.  tol vol off.  Solute/acid/base/K ok. Phos low, replete.  Would like to get off NE and mil if can, and then to IHD 2 CM as above, per cards 3 Anemia esa 4 massive obesity 5 VRE sepsis on Dapto 6 DM controlled 7 OSA P CRRT, lower vol, taper NE, ? Milrinone    LOS: 19 days   Gradyn Shein L 01/14/2017,7:33 AM

## 2017-01-14 NOTE — Progress Notes (Addendum)
ANTICOAGULATION CONSULT NOTE - Follow Up Consult  Pharmacy Consult for heparin Indication: atrial fibrillation  Labs:  Recent Labs  01/11/17 0529  01/12/17 0507  01/12/17 1600 01/13/17 0358 01/13/17 0528 01/13/17 0657 01/13/17 0719 01/13/17 1111 01/13/17 1457 01/13/17 2149  HGB 8.3*  --  8.1*  --   --  8.5*  --  9.5*  --   --   --   --   HCT 27.2*  --  28.0*  --   --  28.4*  --  28.0*  --   --   --   --   PLT 149*  --  158  --   --  237  --   --   --   --   --   --   HEPARINUNFRC 0.52  < >  --   --   --  <0.10*  --   --  1.66* 1.50*  --  1.01*  CREATININE 1.90*  --   --   < > 1.62*  --  1.57*  --   --   --  1.59*  --   < > = values in this interval not displayed.   Assessment: 61yo female remains above goal on heparin though getting closer to goal; has been difficult reaching and maintaining goal on this pt throughout hospitalization.  Goal of Therapy:  Heparin level 0.3-0.7 units/ml   Plan:  Will decrease heparin gtt by 4 units/kgABW/hr and check level in [place], PharmD, BCPS  01/14/2017,12:02 AM   ADDENDUM: Heparin level now at upper end of goal (0.7); will continue heparin gtt for now and check additional level. VB   5:18 AM

## 2017-01-14 NOTE — Progress Notes (Addendum)
Pt had 9-beat run of VT. Electrolytes in range, no obvious causes apparent at this time. Pt resting comfortably, chronic Afib rate controlled 110s.   Will continue to monitor and pass on to day shift RN.  Print out of CV strip placed in pt's chart.

## 2017-01-14 NOTE — Progress Notes (Signed)
ANTICOAGULATION CONSULT NOTE - Follow Up Consult  Pharmacy Consult for heparin Indication: atrial fibrillation  Labs:  Recent Labs  01/12/17 0507  01/13/17 0358 01/13/17 0528 01/13/17 0657  01/13/17 1457 01/13/17 2149 01/14/17 0408 01/14/17 1015  HGB 8.1*  --  8.5*  --  9.5*  --   --   --  8.6*  --   HCT 28.0*  --  28.4*  --  28.0*  --   --   --  30.1*  --   PLT 158  --  237  --   --   --   --   --  197  --   HEPARINUNFRC  --   --  <0.10*  --   --   < >  --  1.01* 0.70 0.67  CREATININE  --   < >  --  1.57*  --   --  1.59*  --  1.54*  --   < > = values in this interval not displayed.   Assessment: 61yo female now at goal on heparin after multiple rate adjustments; it has been difficult reaching and maintaining goal on this pt the past couple days.  Goal of Therapy:  Heparin level 0.3-0.7 units/ml   Plan:  Continue heparin infusion at 2500 units/hr Daily CBC and heparin level  Erin Hearing PharmD., BCPS Clinical Pharmacist Pager [phone] 01/14/2017 11:33 AM

## 2017-01-14 NOTE — Progress Notes (Signed)
Pt refuses CPAP at this time, says it's not comfortable. Educated about OSA and need for CPAP, still refuses at this time.  Currently resting on 4L Foley, sat 99%.  Will continue to monitor.

## 2017-01-14 NOTE — Progress Notes (Signed)
MD Deterding stopped by patient's room, RN and MD discussed patients varying blood pressures.  MD Deterding advised he would prefer blood pressure be taken in patient's upper arm as other locations tend to be less accurate but MD advised he understands difficulty in obtaining blood pressures on patient at times due to patient anatomy.

## 2017-01-14 NOTE — Care Management Note (Signed)
Case Management Note Previous note created by Berniece Salines 12/31/16  Patient Details  Name: Paige Marshall MRN: [id number] Date of Birth: [birth year]  Subjective/Objective:   CHF, pt currently on Milrinone gtt, Amiodarone gtt, Cardizem gtt                    Action/Plan: Discharge Planning: please see previous notes  Chart reviewed. Pt has been to SNF rehab at [facility].  Plan is dc to home with HH. Pt active with [facility] for HHPT, RN, OT and aide. Will need HH orders with F2F prior to dc. [facility] Liaison, they will continue to follow post dc. Will continue to follow for dc needs.   PCP  EDWARDS, [place]   Expected Discharge Date:                  Expected Discharge Plan:  [place]  In-House Referral:  Clinical Social Work  Discharge planning Services  CM Consult  Post Acute Care Choice:  Home Health Choice offered to:  Patient  DME Arranged:    DME Agency:     HH Arranged:  RN, PT, OT [place] Agency:  [facility]  Status of Service:  In process, will continue to follow  If discussed at Long Length of Stay Meetings, dates discussed:    Additional Comments: 01/14/2017  Pt remains on multiple drips and CRRT  01/06/17 Pt remains on multiple drips.  CM will continue to follow for discharge needs Maryclare Labrador, RN 01/14/2017, 3:50 PM

## 2017-01-14 NOTE — Progress Notes (Signed)
[facility] called, spoke with Dr. Jimmy Footman regarding pt's c/o pressure in ears when wearing CPAP (pt didn't wear it last night d/t this discomfort). MD during day had suggested a decongestant to help relieve the pressure. Discussed this option with MD. Michela Pitcher she'd review chart. Awaiting orders. Will continue to monitor.

## 2017-01-14 NOTE — Progress Notes (Signed)
[place] Progress Note Patient Name: Paige Marshall DOB: 1956-08-16 MRN: [id number]   Date of Service  01/14/2017  HPI/Events of Note  Bedside nurse requesting decongestant for patient to facilitate tolerance of BiPAP.  eICU Interventions  Afrin nasal spray 2 puffs per nostril BID prn for 3 days     Intervention Category Minor Interventions: Other:  Nickol Collister 01/14/2017, 11:44 PM

## 2017-01-14 NOTE — Progress Notes (Addendum)
qPhysical Therapy Treatment Patient Details Name: Paige Marshall MRN: [id number] DOB: 08/03/1956 Today's Date: 01/14/2017    History of Present Illness 61 y.o. female with morbid obesity / wheelchair bound, untreated OHS, chronic combined systolic / diastolic CHF, NICM, AF on coumadin, HTN, 3L O2 dependent, pulmonary hypertension, COPD & CKD III.  The patient suffered a left fibular fracture in late 2017 and has been at [facility] since. Admitted 12/26/16 from SNF with acute CHF. Pt started on CVVHD that was then discontinued for HD. 3/31 pt developed septic shock and CVVHD restarted.    PT Comments    Pt OOB to chair with maxisky and tolerated very well. Hopefully can now begin making progress toward standing.   Follow Up Recommendations  SNF;Supervision/Assistance - 24 hour     Equipment Recommendations  None recommended by PT    Recommendations for Other Services OT consult     Precautions / Restrictions Precautions Precautions: Fall;Other (comment) Precaution Comments: CVVHD Restrictions Weight Bearing Restrictions: No    Mobility  Bed Mobility Overal bed mobility: Needs Assistance Bed Mobility: Rolling Rolling: Min assist         General bed mobility comments: Assist bringing leg over  Transfers Overall transfer level: Needs assistance               General transfer comment: Pt transferred bed to recliner with [facility]  Ambulation/Gait                 Stairs            Wheelchair Mobility    Modified Rankin (Stroke Patients Only)       Balance Overall balance assessment: Needs assistance Sitting-balance support: No upper extremity supported;Feet supported Sitting balance-Leahy Scale: Fair Sitting balance - Comments: Pt able to bring trunk forward sitting in the chair and maintaining sitting edge of chair                                    Cognition Arousal/Alertness: Awake/alert Behavior During Therapy: WFL for tasks  assessed/performed Overall Cognitive Status: Within Functional Limits for tasks assessed                                        Exercises General Exercises - Lower Extremity Long Arc Quad: AROM;Both;10 reps;Seated    General Comments        Pertinent Vitals/Pain Pain Assessment: No/denies pain    Home Living                      Prior Function            PT Goals (current goals can now be found in the care plan section) Progress towards PT goals: Progressing toward goals    Frequency    Min 3X/week      PT Plan Current plan remains appropriate;Frequency needs to be updated    Co-evaluation             End of Session Equipment Utilized During Treatment: Oxygen Activity Tolerance: Patient tolerated treatment well Patient left: with call bell/phone within reach;in chair Nurse Communication: Mobility status;Need for lift equipment (nurse assisted with transfer) PT Visit Diagnosis: Muscle weakness (generalized) (M62.81);Difficulty in walking, not elsewhere classified (R26.2)     Time: 5329-9242 PT Time Calculation (min) (ACUTE ONLY): 30  min  Charges:  $Therapeutic Activity: 23-37 mins                    G Codes:       [facility] PT [place] 01/14/2017, 4:11 PM

## 2017-01-14 NOTE — Progress Notes (Signed)
CRRT patient fluid removal goal has been set by RN's for -50 mL/hour post cardiac rhythm rate change on 4/4 when CRRT was ran even for 3 hours per MD order.  Reviewed orders in Epic and CRRT patient fluid removal goal ordered at -100 mL/hour, spoke with MD Deterding and MD Deterding advised RN to pull -100 mL/hour.

## 2017-01-14 NOTE — Progress Notes (Signed)
PULMONARY  / CRITICAL CARE MEDICINE  Name: Paige Marshall MRN: [id number] DOB: 12/30/1955    LOS: 2  REFERRING MD :  IMTS  CHIEF COMPLAINT:  Shortness of breath  BRIEF PATIENT DESCRIPTION: Paige Marshall is a 61 year old woman with poorly controlled OHS, chronic combined systolic and diastolic CHF, a-fib/a-flutter on chronic anticoagulation, hypertension, COPD, CKD Stage 3 hospitalized for acute decompensated CHF complicated by cardiorenal syndrome on CRRT and now E. Faecalis bacteremia.   LINES / TUBES: 3/31 R IJ HD catheter 4/01 R femoral arterial line 4/02 L IJ  CULTURES: 4/01 E. Faecalis [R to vancomycin, S to ampicilin, gentamicin, linezolid] 4/02 Negative  ANTIBIOTICS: 4/01 Vanc/cefepime >> daptomycin 4/03 Daptomycin >> ampicillin  SIGNIFICANT EVENTS:  3/18: Admitted for acute decompensated CHF 3/21: CRRT initiated for diuresis  3/29: Transferred to stepdown  3/31: Developed fever and PCCM re-consulted   DIET:  Heart Healthy with fluid restriction  DVT Px:  Heparin IV  GI Px:  None  HISTORY OF PRESENT ILLNESS:  61 y/o F admitted from rehab facility (resident since November 2017) on 3/18 with complaints of shortness of breath & lower extremity swelling.    The patient has a medical history of morbid obesity / wheelchair bound, untreated OHS, chronic combined systolic / diastolic CHF, NICM, AF on coumadin, HTN, 3L O2 dependent, pulmonary hypertension, former smoker (11 pk years) with COPD & CKD III.  The patient suffered a left fibular fracture in late 2017.  She was recently admitted from 1/28-11/13/16 for acute CHF exacerbation.  At that time, her discharge weight was 370lbs.    She returned from the rehab facility on 3/18 with increased SOB and lower extremity swelling.  She if followed by Paige Marshall for cardiology and was recently switched to lasix and metolazone.  Lasix was discontinued at the SNF due to rising sr cr.  Her admit weight was 411lbs.  She was admitted  by IMTS for decompensated CHF.  She developed worsening AKI with concern for cardio-renal syndrome.  An HD catheter was inserted 3/21 and CVVHD was initiated.   She remains on milrinone, amiodarone and heparin gtt's.  Rate control has been difficult to achieve.  Over the past 24 hours, she is net negative 3.5L.  She developed respiratory distress am of 3/22 and BiPAP was initiated.    Sent to sdu 3/29, returned ICU , shock, CVVHD needs, fever > VRE bacteremia.  INTERVAL HISTORY:  Comfortable, no complaints Notes that her cardioversion was unsuccessful  VITAL SIGNS: Temp:  [97.5 F (36.4 C)-98.4 F (36.9 C)] 97.6 F (36.4 C) (04/06 0823) Pulse Rate:  [76-164] 105 (04/06 0700) Resp:  [12-32] 18 (04/06 0804) BP: (75-124)/(48-88) 94/79 (04/06 0804) SpO2:  [74 %-100 %] 91 % (04/06 0804) Weight:  [132.9 kg (293 lb)] 132.9 kg (293 lb) (04/06 0500) HEMODYNAMICS: CVP:  [12 mmHg-19 mmHg] 16 mmHg VENTILATOR SETTINGS:   INTAKE / OUTPUT: Intake/Output      04/05 0701 - 04/06 0700 04/06 0701 - 04/07 0700   P.O. 580    I.V. (mL/kg) 1218.1 (9.2)    IV Piggyback 367 93   Total Intake(mL/kg) 2165.1 (16.3) 93 (0.7)   Urine (mL/kg/hr)     Other 3842 (1.2) 130 (0.5)   Blood 0 (0)    Total Output 3842 130   Net -1677 -37          PHYSICAL EXAMINATION: Physical Exam  Constitutional: She is oriented to person, place, and time. No distress.  HENT:  Head:  Normocephalic and atraumatic.  Eyes: Conjunctivae are normal. No scleral icterus.  Neck:  L IJ catheter, R HD catheter  Cardiovascular: An irregularly irregular rhythm present. Tachycardia present.   Irregularly irregular, rate 100's  Pulmonary/Chest: Effort normal. No respiratory distress. She has no rales.  Abdominal: Soft. She exhibits no distension.  Musculoskeletal: She exhibits edema.  Neurological: She is alert and oriented to person, place, and time.  Skin: Skin is warm and dry. She is not diaphoretic.  Psychiatric: Affect normal.      LABS: Cbc  Recent Labs Lab 01/12/17 0507 01/13/17 0358 01/13/17 0657 01/14/17 0408  WBC 5.0 5.5  --  5.3  HGB 8.1* 8.5* 9.5* 8.6*  HCT 28.0* 28.4* 28.0* 30.1*  PLT 158 237  --  197    Chemistry   Recent Labs Lab 01/12/17 0601  01/13/17 0528 01/13/17 0657 01/13/17 0731 01/13/17 1457 01/14/17 0408  NA 136  < > 133* 135  --  136 137  K 3.7  < > 6.2* 6.4* 4.5 4.4 4.2  CL 101  < > 99*  --   --  102 99*  CO2 25  < > 26  --   --  26 27  BUN 7  < > 8  --   --  7 <5*  CREATININE 1.72*  < > 1.57*  --   --  1.59* 1.54*  CALCIUM 8.7*  < > 8.6*  --   --  8.9 9.1  MG 2.3  --  2.7*  --   --   --  2.6*  PHOS 2.0*  < > 2.5  --   --  2.3* 2.4*  GLUCOSE 94  < > 89 89  --  109* 89  < > = values in this interval not displayed.  Liver fxn  Recent Labs Lab 01/08/17 2142  01/13/17 0528 01/13/17 1457 01/14/17 0408  AST 22  --   --   --   --   ALT 17  --   --   --   --   ALKPHOS 64  --   --   --   --   BILITOT 2.0*  --   --   --   --   PROT 5.8*  --   --   --   --   ALBUMIN 2.7*  < > 2.7* 2.9* 2.6*  < > = values in this interval not displayed. coags  Recent Labs Lab 01/07/17 0941  INR 1.50   Cardiac markers  Recent Labs Lab 01/10/17 0515  CKTOTAL 27*   ABG  Recent Labs Lab 01/07/17 1100 01/08/17 2305  PHART  --  7.440  PCO2ART  --  41.2  PO2ART  --  51.7*  HCO3 24.0  25.0 27.5  TCO2 25  26  --     CBG trend No results for input(s): GLUCAP in the last 168 hours.  DIAGNOSES: Principal Problem:   VRE bacteremia Active Problems:   Essential hypertension   PAF (paroxysmal atrial fibrillation) (HCC)   Obstructive sleep apnea   Morbid obesity (HCC)   Obesity hypoventilation syndrome (HCC)   COPD mixed type (HCC)   Chronic respiratory failure with hypoxia (HCC)   Acute kidney injury superimposed on CKD (HCC)   Acute on chronic combined systolic and diastolic CHF (congestive heart failure) (HCC)   Hyperkalemia   Thoracic aortic atherosclerosis  (HCC)   Cardiorenal syndrome   Acute hypoxemic respiratory failure (HCC)   History of conscious sedation  Acute pulmonary edema (HCC)   Severe sepsis with septic shock (HCC)   Hemodialysis catheter infection (HCC)   PAH (pulmonary artery hypertension)   ASSESSMENT / PLAN:  PULMONARY  ASSESSMENT: Acute on chronic hypoxemic hypercapneic respiratory failure OSA/OHS History of COPD  PLAN:   She is tolerating CPAP, continue this daily at bedtime  CARDIOVASCULAR  ASSESSMENT:  Acute on chronic combined diastolic CHF with volume overload A-fib/a-flutter on chronic anticoagulation Hypertension  PLAN:  Continue amiodarone We norepinephrine as able Continue Midodrin Failed cardioversion 4/5. She may be a candidate at some point for AV nodal ablation but not at this time.  RENAL  ASSESSMENT:  Acute on chronic renal failure Stage IV  PLAN:   Continue CVVHD and volume removal per nephrology plans Transition to intermittent hemodialysis when ready to do so per nephrology Follow serial BMP   HEMATOLOGIC  ASSESSMENT:   Normocytic anemia: Hb at baseline 7-8. Suspect chronic renal insufficiency.  PLAN:  Follow CBC   INFECTIOUS  ASSESSMENT:   E. Faecalis bacteremia / VRE: Sensitive to ampicillin. Follow cultures without growth which is reassuring.  PLAN:   Appreciate ID assistance, continue ampicillin Repeat cultures are negative so far. Continue tunneled hemanalysis catheter as long as these remain negative No evidence endocarditis on TEE 4/5  NEUROLOGIC  ASSESSMENT:   Anxiety  PLAN:   Xanax when necessary ordered   CLINICAL SUMMARY: Ms. Curnutt is a 61 year old woman with poorly controlled OHS, chronic combined systolic and diastolic CHF, a-fib/a-flutter on chronic anticoagulation, hypertension, COPD, CKD Stage 3 hospitalized for acute decompensated CHF complicated by cardiorenal syndrome on CRRT and now E. Faecalis bacteremia.    Independent critical care  time 31 minutes  Baltazar Apo, MD, PhD 01/14/2017, 11:10 AM [facility] [phone] or if no answer [phone]

## 2017-01-14 NOTE — Progress Notes (Signed)
Advanced Heart Failure Rounding Note  PCP: EDWARDS, MICHELLE P, NP  Primary Cardiologist: Dr. Gwenlyn Found   Subjective:    Weight down to 293 on CVVHD. Failed DCCV yesterday.   Remains in AF 100-110.    Objective:   Weight Range: 132.9 kg (293 lb) Body mass index is 45.21 kg/m.   Vital Signs:   Temp:  [97 F (36.1 C)-98.4 F (36.9 C)] 97.5 F (36.4 C) (04/06 0353) Pulse Rate:  [76-164] 105 (04/06 0700) Resp:  [12-32] 14 (04/06 0700) BP: (75-127)/(48-100) 92/60 (04/06 0700) SpO2:  [74 %-100 %] 99 % (04/06 0700) Weight:  [132.9 kg (293 lb)] 132.9 kg (293 lb) (04/06 0500) Last BM Date: 01/11/17  Weight change: Filed Weights   01/12/17 0500 01/13/17 0500 01/14/17 0500  Weight: 132.9 kg (293 lb) 133.8 kg (295 lb) 132.9 kg (293 lb)    Intake/Output:   Intake/Output Summary (Last 24 hours) at 01/14/17 0801 Last data filed at 01/14/17 0700  Gross per 24 hour  Intake          2068.25 ml  Output             3723 ml  Net         -1654.75 ml     Physical Exam: General:  Lying in bed.NAD HEENT: normal anicteric Neck: supple.  LIJ TLC. R chest tunneled cath Carotids 2+ bilat; no bruits. No lymphadenopathy or thryomegaly appreciated. Cor: PMI nonpalpable IRR IRR  No rubs, gallops or murmurs. Lungs: clear CTA Abdomen: obese soft, nontender, nondistended. No hepatosplenomegaly. No bruits or masses. Good bowel sounds. Extremities: no cyanosis, clubbing, rash, trace - 1+ edema Neuro: alert & orientedx3, cranial nerves grossly intact. moves all 4 extremities w/o difficulty. Affect pleasant   Telemetry: Afib 100-110, Personally reviewed    Labs: CBC  Recent Labs  01/13/17 0358 01/13/17 0657 01/14/17 0408  WBC 5.5  --  5.3  HGB 8.5* 9.5* 8.6*  HCT 28.4* 28.0* 30.1*  MCV 87.1  --  90.7  PLT 237  --  335   Basic Metabolic Panel  Recent Labs  01/13/17 0528  01/13/17 1457 01/14/17 0408  NA 133*  < > 136 137  K 6.2*  < > 4.4 4.2  CL 99*  --  102 99*  CO2 26   --  26 27  GLUCOSE 89  < > 109* 89  BUN 8  --  7 <5*  CREATININE 1.57*  --  1.59* 1.54*  CALCIUM 8.6*  --  8.9 9.1  MG 2.7*  --   --  2.6*  PHOS 2.5  --  2.3* 2.4*  < > = values in this interval not displayed.  BNP: BNP (last 3 results)  Recent Labs  09/18/16 1126 11/07/16 1633 12/26/16 1643  BNP 991.0* 490.4* 1,116.0*    Medications:     Scheduled Medications: . ampicillin (OMNIPEN) IV  2 g Intravenous Q8H  . bisacodyl  10 mg Oral Daily  . cefTRIAXone (ROCEPHIN)  IV  2 g Intravenous Q12H  . Chlorhexidine Gluconate Cloth  6 each Topical Daily  . darbepoetin (ARANESP) injection - NON-DIALYSIS  150 mcg Subcutaneous Q Tue-1800  . mouth rinse  15 mL Mouth Rinse BID  . midodrine  10 mg Oral TID WC  . senna-docusate  2 tablet Oral QHS  . sodium chloride flush  3 mL Intravenous Q12H  . sodium phosphate  Dextrose 5% IVPB  30 mmol Intravenous Once    Infusions: . sodium  chloride    . amiodarone 30 mg/hr (01/14/17 0700)  . heparin 2,500 Units/hr (01/14/17 0700)  . norepinephrine (LEVOPHED) Adult infusion 3 mcg/min (01/14/17 0700)  . dialysis replacement fluid (prismasate) 300 mL/hr at 01/13/17 1953  . dialysis replacement fluid (prismasate) 300 mL/hr at 01/13/17 2001  . dialysate (PRISMASATE) 1,500 mL/hr at 01/14/17 0559    PRN Medications: Place/Maintain arterial line **AND** sodium chloride, Place/Maintain arterial line **AND** sodium chloride, acetaminophen, ALPRAZolam, diphenhydrAMINE-zinc acetate, heparin, heparin, ondansetron (ZOFRAN) IV, oxyCODONE, polyethylene glycol, promethazine, sodium chloride, sodium chloride flush, sorbitol   Assessment/Plan   1. Acute on chronic systolic/diastolic CHF Volume status managed by with CVVHD. Per Nephrology.  - EF 25-30% on TEE 4/5 (down from 55% in 3/18) 2. Paroxysmal atrial fibrillation on Coumadin    -s/p DC-CV on 3/27    - failed DC-CV on 4/5 Remain on heparin drip. Hemoglobin stable.  3. Acute on chronic kidney disease  stage IV Remains on CVVHD per Nephrology.  4. Obesity hypoventilation syndrome.  5. Hyperkalemia 6. PAH with acute RV failure/cor pulmonale 7. Acute on chronic respiratory failure 8. Anemia- Hemoglobin 8.6 9. NSVT- K 4.2 10. Septic Shock- Blood CX--> VRE- Repeat Blood CX negative. ID following.  Off norepi. Improved  Failed DC-CV yesterday. Remains in AF. Rate 110-120. On heparin and amio. Will continue. 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 Will watch AF weekend. May need to consider AV node ablation at some point but given recent VRE she is not good pacer candidate  No endocarditis on TEE but systolic function down to 25-30%. Volume ok on CVVHD. Antibiotics for VRE per ID  Back on norepi after DC-CV. Will wean as tolerated.   Renal function and electrolytes stable.  Hgb 8.6   Length of Stay: 16  Advanced Heart Failure Team  Pager [phone] M-F 7am-4pm.  Please contact [facility] for night-coverage after hours (4p -7a ) and weekends on [email or web address]

## 2017-01-15 ENCOUNTER — Encounter (HOSPITAL_COMMUNITY): Payer: Self-pay

## 2017-01-15 ENCOUNTER — Inpatient Hospital Stay (HOSPITAL_COMMUNITY): Payer: Medicare Other

## 2017-01-15 LAB — CBC
HEMATOCRIT: 32.1 % — AB (ref 36.0–46.0)
Hemoglobin: 9.2 g/dL — ABNORMAL LOW (ref 12.0–15.0)
MCH: 26 pg (ref 26.0–34.0)
MCHC: 28.7 g/dL — ABNORMAL LOW (ref 30.0–36.0)
MCV: 90.7 fL (ref 78.0–100.0)
Platelets: 223 10*3/uL (ref 150–400)
RBC: 3.54 MIL/uL — ABNORMAL LOW (ref 3.87–5.11)
RDW: 24.7 % — ABNORMAL HIGH (ref 11.5–15.5)
WBC: 5.7 10*3/uL (ref 4.0–10.5)

## 2017-01-15 LAB — RENAL FUNCTION PANEL
ALBUMIN: 2.8 g/dL — AB (ref 3.5–5.0)
ANION GAP: 13 (ref 5–15)
Albumin: 2.8 g/dL — ABNORMAL LOW (ref 3.5–5.0)
Anion gap: 12 (ref 5–15)
BUN: 5 mg/dL — AB (ref 6–20)
BUN: 6 mg/dL (ref 6–20)
CALCIUM: 9.2 mg/dL (ref 8.9–10.3)
CHLORIDE: 99 mmol/L — AB (ref 101–111)
CO2: 26 mmol/L (ref 22–32)
CO2: 25 mmol/L (ref 22–32)
CREATININE: 1.54 mg/dL — AB (ref 0.44–1.00)
Calcium: 9.1 mg/dL (ref 8.9–10.3)
Chloride: 99 mmol/L — ABNORMAL LOW (ref 101–111)
Creatinine, Ser: 1.61 mg/dL — ABNORMAL HIGH (ref 0.44–1.00)
GFR calc non Af Amer: 36 mL/min — ABNORMAL LOW (ref >60)
GFR calc non Af Amer: 34 mL/min — ABNORMAL LOW (ref >60)
GFR, EST AFRICAN AMERICAN: 42 mL/min — AB (ref >60)
GFR, EST AFRICAN AMERICAN: 40 mL/min — AB (ref >60)
GLUCOSE: 99 mg/dL (ref 65–99)
Glucose, Bld: 94 mg/dL (ref 65–99)
PHOSPHORUS: 3.7 mg/dL (ref 2.5–4.6)
POTASSIUM: 3.9 mmol/L (ref 3.5–5.1)
POTASSIUM: 4.4 mmol/L (ref 3.5–5.1)
Phosphorus: 2.1 mg/dL — ABNORMAL LOW (ref 2.5–4.6)
Sodium: 137 mmol/L (ref 135–145)
Sodium: 137 mmol/L (ref 135–145)

## 2017-01-15 LAB — CULTURE, BLOOD (ROUTINE X 2)
CULTURE: NO GROWTH
Culture: NO GROWTH
Special Requests: ADEQUATE

## 2017-01-15 LAB — MAGNESIUM: Magnesium: 2.4 mg/dL (ref 1.7–2.4)

## 2017-01-15 LAB — HEPARIN LEVEL (UNFRACTIONATED): Heparin Unfractionated: 0.56 IU/mL (ref 0.30–0.70)

## 2017-01-15 MED ORDER — SODIUM PHOSPHATES 45 MMOLE/15ML IV SOLN
30.0000 mmol | Freq: Once | INTRAVENOUS | Status: AC
  Administered 2017-01-15: 30 mmol via INTRAVENOUS
  Filled 2017-01-15: qty 10

## 2017-01-15 NOTE — Progress Notes (Signed)
PULMONARY  / CRITICAL CARE MEDICINE  Name: Paige Marshall MRN: [id number] DOB: [birth year]    LOS: 65  REFERRING MD :  IMTS  CHIEF COMPLAINT:  Shortness of breath  BRIEF PATIENT DESCRIPTION: Paige Marshall is a 61 year old woman with poorly controlled OHS, chronic combined systolic and diastolic CHF, a-fib/a-flutter on chronic anticoagulation, hypertension, COPD, CKD Stage 3 hospitalized for acute decompensated CHF complicated by cardiorenal syndrome on CRRT and now E. Faecalis bacteremia.   LINES / TUBES: 3/31 R IJ HD catheter 4/01 R femoral arterial line 4/02 L IJ  CULTURES: 4/01 E. Faecalis [R to vancomycin, S to ampicilin, gentamicin, linezolid] 4/02 Negative  ANTIBIOTICS: 4/01 Vanc/cefepime >> daptomycin 4/03 Daptomycin >> ampicillin  SIGNIFICANT EVENTS:  3/18: Admitted for acute decompensated CHF 3/21: CRRT initiated for diuresis  3/29: Transferred to stepdown  3/31: Developed fever and PCCM re-consulted   DIET:  Heart Healthy with fluid restriction  DVT Px:  Heparin IV  GI Px:  None  HISTORY OF PRESENT ILLNESS:  61 y/o F admitted from rehab facility (resident since November 2017) on 3/18 with complaints of shortness of breath & lower extremity swelling.    The patient has a medical history of morbid obesity / wheelchair bound, untreated OHS, chronic combined systolic / diastolic CHF, NICM, AF on coumadin, HTN, 3L O2 dependent, pulmonary hypertension, former smoker (11 pk years) with COPD & CKD III.  The patient suffered a left fibular fracture in late 2017.  She was recently admitted from 1/28-11/13/16 for acute CHF exacerbation.  At that time, her discharge weight was 370lbs.    She returned from the rehab facility on 3/18 with increased SOB and lower extremity swelling.  She if followed by Dr. Gwenlyn Found for cardiology and was recently switched to lasix and metolazone.  Lasix was discontinued at the SNF due to rising sr cr.  Her admit weight was 411lbs.  She was admitted  by IMT[facility] for decompensated CHF.  She developed worsening AKI with concern for cardio-renal syndrome.  An HD catheter was inserted 3/21 and CVVHD was initiated.   She remains on milrinone, amiodarone and heparin gtt's.  Rate control has been difficult to achieve.  Over the past 24 hours, she is net negative 3.5L.  She developed respiratory distress am of 3/22 and BiPAP was initiated.    Sent to sdu 3/29, returned ICU , shock, CVVHD needs, fever > VRE bacteremia.  INTERVAL HISTORY:  Up to chair, comfortable Norepinephrine now weaned to 1 CVVHD running  VITAL SIGNS: Temp:  [96.3 F (35.7 C)-97.9 F (36.6 C)] 97.8 F (36.6 C) (04/07 0824) Pulse Rate:  [96-230] 110 (04/07 0700) Resp:  [14-29] 18 (04/07 0700) BP: (53-208)/(35-194) 85/56 (04/07 0700) SpO2:  [92 %-100 %] 99 % (04/07 0700) FiO2 (%):  [50 %] 50 % (04/07 0030) HEMODYNAMICS: CVP:  [7 mmHg-16 mmHg] 7 mmHg VENTILATOR SETTINGS: FiO2 (%):  [50 %] 50 % INTAKE / OUTPUT: Intake/Output      04/06 0701 - 04/07 0700 04/07 0701 - 04/08 0700   P.O. 740    I.V. (mL/kg) 1221.8 (9.2)    IV Piggyback 508    Total Intake(mL/kg) 2469.8 (18.6)    Other 4385 301   Stool 0    Blood     Total Output 4385 301   Net -1915.2 -301        Stool Occurrence 4 x      PHYSICAL EXAMINATION: Physical Exam  Constitutional: She is oriented to person, place, and time.  No distress.  HENT:  Head: Normocephalic and atraumatic.  Eyes: Conjunctivae are normal. No scleral icterus.  Neck:  L IJ catheter, R HD catheter  Cardiovascular: An irregularly irregular rhythm present. Tachycardia present.   Irregularly irregular, rate 100's  Pulmonary/Chest: Effort normal. No respiratory distress. She has no rales.  Abdominal: Soft. She exhibits no distension.  Musculoskeletal: She exhibits edema.  Neurological: She is alert and oriented to person, place, and time.  Skin: Skin is warm and dry. She is not diaphoretic.  Psychiatric: Affect normal.      LABS: Cbc  Recent Labs Lab 01/13/17 0358 01/13/17 0657 01/14/17 0408 01/15/17 0514  WBC 5.5  --  5.3 5.7  HGB 8.5* 9.5* 8.6* 9.2*  HCT 28.4* 28.0* 30.1* 32.1*  PLT 237  --  197 223    Chemistry   Recent Labs Lab 01/13/17 0528  01/14/17 0408 01/14/17 1548 01/15/17 0514  NA 133*  < > 137 137 137  K 6.2*  < > 4.2 4.6 3.9  CL 99*  < > 99* 98* 99*  CO2 26  < > 27 25 26   BUN 8  < > <5* 6 5*  CREATININE 1.57*  < > 1.54* 1.63* 1.54*  CALCIUM 8.6*  < > 9.1 9.1 9.1  MG 2.7*  --  2.6*  --  2.4  PHOS 2.5  < > 2.4* 3.5 2.1*  GLUCOSE 89  < > 89 93 94  < > = values in this interval not displayed.  Liver fxn  Recent Labs Lab 01/08/17 2142  01/14/17 0408 01/14/17 1548 01/15/17 0514  AST 22  --   --   --   --   ALT 17  --   --   --   --   ALKPHOS 64  --   --   --   --   BILITOT 2.0*  --   --   --   --   PROT 5.8*  --   --   --   --   ALBUMIN 2.7*  < > 2.6* 2.9* 2.8*  < > = values in this interval not displayed. coags No results for input(s): APTT, INR in the last 168 hours. Cardiac markers  Recent Labs Lab 01/10/17 0515  CKTOTAL 27*   ABG  Recent Labs Lab 01/08/17 2305  PHART 7.440  PCO2ART 41.2  PO2ART 51.7*  HCO3 27.5    CBG trend No results for input(s): GLUCAP in the last 168 hours.  DIAGNOSES: Principal Problem:   VRE bacteremia Active Problems:   Essential hypertension   PAF (paroxysmal atrial fibrillation) (HCC)   Obstructive sleep apnea   Morbid obesity (HCC)   Obesity hypoventilation syndrome (HCC)   COPD mixed type (HCC)   Chronic respiratory failure with hypoxia (HCC)   Acute kidney injury superimposed on CKD (HCC)   Acute on chronic combined systolic and diastolic CHF (congestive heart failure) (HCC)   Hyperkalemia   Thoracic aortic atherosclerosis (HCC)   Cardiorenal syndrome   Acute hypoxemic respiratory failure (HCC)   History of conscious sedation   Acute pulmonary edema (HCC)   Severe sepsis with septic shock (HCC)    Hemodialysis catheter infection ([place])   PAH (pulmonary artery hypertension)   ASSESSMENT / PLAN:  PULMONARY  ASSESSMENT: Acute on chronic hypoxemic hypercapneic respiratory failure OSA/OHS History of COPD  PLAN:   Continue CPAP daily at bedtime  CARDIOVASCULAR  ASSESSMENT:  Acute on chronic combined diastolic CHF with volume overload A-fib/a-flutter on chronic  anticoagulation Hypertension  PLAN:  Continue amiodarone Wean norepinephrine, goal Weaned to off on 4/7 Continue Midodrin She may be a candidate at some point for AV nodal ablation but not at this time, failed cardioversion  RENAL  ASSESSMENT:  Acute on chronic renal failure Stage IV  PLAN:   Planning to continue CVVHD through today, possibly transition to intermittent dialysis on 4/8 Follow serial BMP   HEMATOLOGIC  ASSESSMENT:   Normocytic anemia: Hb at baseline 7-8. Suspect chronic renal insufficiency.  PLAN:  Follow CBC   INFECTIOUS  ASSESSMENT:   E. Faecalis bacteremia / VRE: Sensitive to ampicillin. Follow cultures without growth which is reassuring.  PLAN:   Continue ampicillin as ordered Repeat blood cultures from 4/2 remained negative. No plans to discontinue her tunneled hemodialysis catheter unless these become positive No evidence endocarditis on TEE 4/5  NEUROLOGIC  ASSESSMENT:   Anxiety  PLAN:   Continue Xanax as needed   CLINICAL SUMMARY: Paige Marshall is a 61 year old woman with poorly controlled OHS, chronic combined systolic and diastolic CHF, a-fib/a-flutter on chronic anticoagulation, hypertension, COPD, CKD Stage 3 hospitalized for acute decompensated CHF complicated by cardiorenal syndrome on CRRT and now E. Faecalis bacteremia. Blood cultures now negative. Goal transition off of CVVHD soon   Independent critical care time 31 minutes  Baltazar Apo, MD, PhD 01/15/2017, 9:48 AM [facility] [phone] or if no answer [phone]

## 2017-01-15 NOTE — Progress Notes (Signed)
ANTICOAGULATION CONSULT NOTE - Follow Up Consult  Pharmacy Consult for heparin Indication: atrial fibrillation  Labs:  Recent Labs  01/13/17 0358  01/13/17 0657  01/14/17 0408 01/14/17 1015 01/14/17 1548 01/15/17 0514  HGB 8.5*  --  9.5*  --  8.6*  --   --  9.2*  HCT 28.4*  --  28.0*  --  30.1*  --   --  32.1*  PLT 237  --   --   --  197  --   --  223  HEPARINUNFRC <0.10*  --   --   < > 0.70 0.67  --  0.56  CREATININE  --   < >  --   < > 1.54*  --  1.63* 1.54*  < > = values in this interval not displayed.   Assessment: 61yo female now at goal on heparin after multiple rate adjustments; it has been difficult reaching and maintaining goal on this pt the past couple days. Heparin drip 2500 uts/hr HL 0.56 cbc stable  Goal of Therapy:  Heparin level 0.3-0.7 units/ml   Plan:  Continue heparin infusion at 2500 units/hr Daily CBC and heparin level  Bonnita Nasuti Pharm.D. CPP, BCPS Clinical Pharmacist [phone] 01/15/2017 8:12 AM

## 2017-01-15 NOTE — Progress Notes (Signed)
[name] Progress Note Patient Name: [name] DOB: 08-Feb-1956 MRN: [id number]   Date of Service  01/15/2017  HPI/Events of Note  Hypophosphatemia  eICU Interventions  Phos replaced     Intervention Category Intermediate Interventions: Electrolyte abnormality - evaluation and management  Zakariye Nee 01/15/2017, 6:43 AM

## 2017-01-15 NOTE — Progress Notes (Signed)
Advanced Heart Failure Rounding Note  PCP: EDWARDS, MICHELLE P, NP  Primary Cardiologist: Dr. Gwenlyn Found   Subjective:    Weight stable at 293. Remains on CVVHD. Now of norepi. SBP 100-110 currently  Failed DC-CV. 4/5   Remains in AF 110-120. Asymptomatic   Objective:   Weight Range: 132.9 kg (293 lb) Body mass index is 45.21 kg/m.   Vital Signs:   Temp:  [96.3 F (35.7 C)-97.9 F (36.6 C)] 97.8 F (36.6 C) (04/07 0824) Pulse Rate:  [96-230] 151 (04/07 1100) Resp:  [14-29] 24 (04/07 1100) BP: (53-208)/(35-194) 113/56 (04/07 1100) SpO2:  [90 %-100 %] 90 % (04/07 1100) FiO2 (%):  [50 %] 50 % (04/07 0030) Last BM Date: 01/14/17  Weight change: Filed Weights   01/12/17 0500 01/13/17 0500 01/14/17 0500  Weight: 132.9 kg (293 lb) 133.8 kg (295 lb) 132.9 kg (293 lb)    Intake/Output:   Intake/Output Summary (Last 24 hours) at 01/15/17 1200 Last data filed at 01/15/17 1100  Gross per 24 hour  Intake          2152.46 ml  Output             4048 ml  Net         -1895.54 ml     Physical Exam: General:  Lying in bed. NAD HEENT: normal anicteric  Neck: supple.LIJ TLC R chest tunneled cath Carotids 2+ bilat; no bruits. No lymphadenopathy or thryomegaly appreciated. Cor: PMI IRR IRR tachy. Distant  Lungs: clear anteriorly  Abdomen: markedly obese soft, nontender, nondistended. No hepatosplenomegaly. No bruits or masses. Good BS Extremities: no cyanosis, clubbing, rash, trace  edema Neuro: alert & orientedx3, cranial nerves grossly intact. moves all 4 extremities w/o difficulty. Affect pleasant   Telemetry: Afib 100-120. Personally reviewed     Labs: CBC  Recent Labs  01/14/17 0408 01/15/17 0514  WBC 5.3 5.7  HGB 8.6* 9.2*  HCT 30.1* 32.1*  MCV 90.7 90.7  PLT 197 761   Basic Metabolic Panel  Recent Labs  01/14/17 0408 01/14/17 1548 01/15/17 0514  NA 137 137 137  K 4.2 4.6 3.9  CL 99* 98* 99*  CO2 27 25 26   GLUCOSE 89 93 94  BUN <5* 6 5*    CREATININE 1.54* 1.63* 1.54*  CALCIUM 9.1 9.1 9.1  MG 2.6*  --  2.4  PHOS 2.4* 3.5 2.1*    BNP: BNP (last 3 results)  Recent Labs  09/18/16 1126 11/07/16 1633 12/26/16 1643  BNP 991.0* 490.4* 1,116.0*    Medications:     Scheduled Medications: . ampicillin (OMNIPEN) IV  2 g Intravenous Q8H  . bisacodyl  10 mg Oral Daily  . cefTRIAXone (ROCEPHIN)  IV  2 g Intravenous Q12H  . Chlorhexidine Gluconate Cloth  6 each Topical Daily  . darbepoetin (ARANESP) injection - NON-DIALYSIS  150 mcg Subcutaneous Q Tue-1800  . mouth rinse  15 mL Mouth Rinse BID  . midodrine  10 mg Oral TID WC  . senna-docusate  2 tablet Oral QHS  . sodium chloride flush  10-40 mL Intracatheter Q12H  . sodium chloride flush  3 mL Intravenous Q12H  . sodium phosphate  Dextrose 5% IVPB  30 mmol Intravenous Once    Infusions: . sodium chloride    . amiodarone 30 mg/hr (01/15/17 0700)  . heparin 2,500 Units/hr (01/15/17 0700)  . norepinephrine (LEVOPHED) Adult infusion Stopped (01/15/17 1000)  . dialysis replacement fluid (prismasate) 300 mL/hr at 01/15/17 1000  .  dialysis replacement fluid (prismasate) 300 mL/hr at 01/14/17 1640  . dialysate (PRISMASATE) 1,500 mL/hr at 01/15/17 1009    PRN Medications: Place/Maintain arterial line **AND** sodium chloride, Place/Maintain arterial line **AND** sodium chloride, acetaminophen, ALPRAZolam, diphenhydrAMINE-zinc acetate, heparin, heparin, ondansetron (ZOFRAN) IV, oxyCODONE, oxymetazoline, polyethylene glycol, promethazine, sodium chloride, sodium chloride flush, sodium chloride flush, sorbitol   Assessment/Plan   1. Acute on chronic systolic/diastolic CHF Volume status managed by with CVVHD. Per Nephrology.  - EF 25-30% on TEE 4/5 (down from 55% in 3/18) 2. Paroxysmal atrial fibrillation on Coumadin    -s/p DC-CV on 3/27    - failed DC-CV on 4/5 Remain on heparin drip. Hemoglobin stable.  3. Acute on chronic kidney disease stage IV Remains on CVVHD per  Nephrology.  4. Obesity hypoventilation syndrome.  5. Hyperkalemia 6. PAH with acute RV failure/cor pulmonale 7. Acute on chronic respiratory failure 8. Anemia- Hemoglobin 8.6 9. NSVT- K 4.2 10. Septic Shock- Blood CX--> VRE- Repeat Blood CX negative. ID following.  Off norepi. Improved  Failed DC-CV earlier this week. Remains in AF rate still elevated but somewhat improved. Has been on IV amio for 2 weeks. D/w Dr. Lovena Le (EP). Recommends more amio. Not candidate for AVN ablation/CRT with need for HD and recent enterococcus bacteremia. Continue heparin. Discussed with PharmD.   EF down in setting of AF with RVR. BP too low for b-blocker. No ACE/ARB with ARF  Norepi now off. Next step will be to see if she can tolerate iHD. May be a challenge with decreased EF and PAH.   Hyperkalemia resolved with HD.   Glori Bickers, MD  12:07 PM    Length of Stay: 16  Advanced Heart Failure Team  Pager [phone] M-F 7am-4pm.  Please contact [facility] for night-coverage after hours (4p -7a ) and weekends on [email or web address]

## 2017-01-15 NOTE — Progress Notes (Signed)
Subjective: Interval History: has no complaint, up in chair feeling better.  Objective: Vital signs in last 24 hours: Temp:  [96.3 F (35.7 C)-97.9 F (36.6 C)] 97.7 F (36.5 C) (04/07 0516) Pulse Rate:  [96-230] 110 (04/07 0700) Resp:  [14-29] 18 (04/07 0700) BP: (53-208)/(35-194) 85/56 (04/07 0700) SpO2:  [91 %-100 %] 99 % (04/07 0700) FiO2 (%):  [50 %] 50 % (04/07 0030) Weight change:   Intake/Output from previous day: 04/06 0701 - 04/07 0700 In: 2469.8 [P.O.:740; I.V.:1221.8; IV Piggyback:508] Out: 4385  Intake/Output this shift: No intake/output data recorded.  General appearance: alert, cooperative, no distress and morbidly obese Resp: diminished breath sounds bilaterally Chest wall: RIJ cath Cardio: irregularly irregular rhythm and systolic murmur: holosystolic 2/6, blowing at apex GI: massive, pos bs Extremities: edema 3+ presacral  Lab Results:  Recent Labs  01/14/17 0408 01/15/17 0514  WBC 5.3 5.7  HGB 8.6* 9.2*  HCT 30.1* 32.1*  PLT 197 223   BMET:  Recent Labs  01/14/17 1548 01/15/17 0514  NA 137 137  K 4.6 3.9  CL 98* 99*  CO2 25 26  GLUCOSE 93 94  BUN 6 5*  CREATININE 1.63* 1.54*  CALCIUM 9.1 9.1   No results for input(s): PTH in the last 72 hours. Iron Studies: No results for input(s): IRON, TIBC, TRANSFERRIN, FERRITIN in the last 72 hours.  Studies/Results: Dg Chest Port 1 View  Result Date: 01/15/2017 CLINICAL DATA:  Central line placement EXAM: PORTABLE CHEST 1 VIEW COMPARISON:  Five days ago FINDINGS: Dialysis catheter on the right with tip at the SVC. Left IJ central line with tip at the upper SVC. Marked cardiomegaly. Interstitial opacity seen previously is essentially resolved. No asymmetric airspace disease, effusion, or pneumothorax. IMPRESSION: 1. Stable positioning of central lines, tips overlapping the SVC. 2. Pulmonary edema seen 5 days ago is improved/resolved. Electronically Signed   By: Monte Fantasia M.D.   On: 01/15/2017  07:31    I have reviewed the patient's current medications.  Assessment/Plan: 1 CKD4/AKI on CRRT, lowering vol, solute ok . Acid/base/K ok. Getting Phos replete.  Will try to get off NE, then stop CRRT in am. 2 CM per cards 3 Afib on Amio 4 Dm controlled 5 Massive obesity 6 Debill 7 anemia improving on esa/Fe 8 HPTH 9 VRE sepsis on Dapto P CRRT, lower Ne , plan to get off CRRT and transition in 24-48 h, Cont amio, esa    LOS: 20 days   Paige Marshall L 01/15/2017,8:02 AM

## 2017-01-16 DIAGNOSIS — R6521 Severe sepsis with septic shock: Secondary | ICD-10-CM

## 2017-01-16 LAB — HEPARIN LEVEL (UNFRACTIONATED): Heparin Unfractionated: 0.5 IU/mL (ref 0.30–0.70)

## 2017-01-16 LAB — RENAL FUNCTION PANEL
ANION GAP: 13 (ref 5–15)
Albumin: 2.7 g/dL — ABNORMAL LOW (ref 3.5–5.0)
Albumin: 3.1 g/dL — ABNORMAL LOW (ref 3.5–5.0)
Anion gap: 13 (ref 5–15)
BUN: 6 mg/dL (ref 6–20)
BUN: 7 mg/dL (ref 6–20)
CALCIUM: 9.4 mg/dL (ref 8.9–10.3)
CHLORIDE: 99 mmol/L — AB (ref 101–111)
CO2: 25 mmol/L (ref 22–32)
CO2: 26 mmol/L (ref 22–32)
CREATININE: 1.49 mg/dL — AB (ref 0.44–1.00)
CREATININE: 1.64 mg/dL — AB (ref 0.44–1.00)
Calcium: 9.2 mg/dL (ref 8.9–10.3)
Chloride: 99 mmol/L — ABNORMAL LOW (ref 101–111)
GFR, EST AFRICAN AMERICAN: 44 mL/min — AB (ref >60)
GFR, EST AFRICAN AMERICAN: 39 mL/min — AB (ref >60)
GFR, EST NON AFRICAN AMERICAN: 38 mL/min — AB (ref >60)
GFR, EST NON AFRICAN AMERICAN: 33 mL/min — AB (ref >60)
Glucose, Bld: 88 mg/dL (ref 65–99)
Glucose, Bld: 104 mg/dL — ABNORMAL HIGH (ref 65–99)
POTASSIUM: 3.9 mmol/L (ref 3.5–5.1)
Phosphorus: 2.7 mg/dL (ref 2.5–4.6)
Phosphorus: 2.6 mg/dL (ref 2.5–4.6)
Potassium: 4.5 mmol/L (ref 3.5–5.1)
SODIUM: 138 mmol/L (ref 135–145)
Sodium: 137 mmol/L (ref 135–145)

## 2017-01-16 LAB — MAGNESIUM: MAGNESIUM: 2.5 mg/dL — AB (ref 1.7–2.4)

## 2017-01-16 MED ORDER — RENA-VITE PO TABS
1.0000 | ORAL_TABLET | Freq: Every day | ORAL | Status: DC
Start: 2017-01-16 — End: 2017-01-20
  Administered 2017-01-16 – 2017-01-19 (×4): 1 via ORAL
  Filled 2017-01-16 (×4): qty 1

## 2017-01-16 NOTE — Progress Notes (Signed)
PULMONARY  / CRITICAL CARE MEDICINE  Name: Paige Marshall MRN: [id number] DOB: 11/24/1955    LOS: 63  REFERRING MD :  [facility]  CHIEF COMPLAINT:  Shortness of breath  BRIEF PATIENT DESCRIPTION: Ms. Fahrner is a 61 year old woman with poorly controlled OHS, chronic combined systolic and diastolic CHF, a-fib/a-flutter on chronic anticoagulation, hypertension, COPD, CKD Stage 3 hospitalized for acute decompensated CHF complicated by cardiorenal syndrome on CRRT and now E. Faecalis bacteremia.   LINES / TUBES: 3/31 R IJ HD catheter 4/01 R femoral arterial line >> 4/04 4/02 L IJ  CULTURES: 4/01 E. Faecalis [R to vancomycin, S to ampicilin, gentamicin, linezolid] 4/02 Negative  ANTIBIOTICS: 4/01 Vanc/cefepime >> daptomycin 4/03 Daptomycin >> ampicillin [thru 4/18] 4/05 Ceftriaxone [thru 4/18]  SIGNIFICANT EVENTS:  3/18: Admitted for acute decompensated CHF 3/21: CRRT initiated for diuresis  3/27: NSR s/p DCCV. 3/29: Transferred to stepdown  3/31: Developed fever and PCCM re-consulted  4/06: DCCV attempted and failed. 4/08: Off CRRT.  DIET:  Heart Healthy with fluid restriction  DVT Px:  Heparin IV  GI Px:  None  HISTORY OF PRESENT ILLNESS:  61 y/o F admitted from rehab facility (resident since November 2017) on 3/18 with complaints of shortness of breath & lower extremity swelling.    The patient has a medical history of morbid obesity / wheelchair bound, untreated OHS, chronic combined systolic / diastolic CHF, NICM, AF on coumadin, HTN, 3L O2 dependent, pulmonary hypertension, former smoker (11 pk years) with COPD & CKD III.  The patient suffered a left fibular fracture in late 2017.  She was recently admitted from 1/28-11/13/16 for acute CHF exacerbation.  At that time, her discharge weight was 370lbs.    She returned from the rehab facility on 3/18 with increased SOB and lower extremity swelling.  She if followed by Dr. Gwenlyn Found for cardiology and was recently switched to  lasix and metolazone.  Lasix was discontinued at the SNF due to rising sr cr.  Her admit weight was 411lbs.  She was admitted by [facility] for decompensated CHF.  She developed worsening AKI with concern for cardio-renal syndrome.  An HD catheter was inserted 3/21 and CVVHD was initiated.   She remains on milrinone, amiodarone and heparin gtt's.  Rate control has been difficult to achieve.  Over the past 24 hours, she is net negative 3.5L.  She developed respiratory distress am of 3/22 and BiPAP was initiated.    Sent to sdu 3/29, returned ICU , shock, CVVHD needs, fever > VRE bacteremia.   INTERVAL HISTORY: This morning, her friend is at bedside. She complains of some left sided ear pressure she feels occurs from wearing her CPAP mask. She is still wondering when she can go home.   VITAL SIGNS: Temp:  [97.5 F (36.4 C)-98.5 F (36.9 C)] 98.5 F (36.9 C) (04/08 0828) Pulse Rate:  [87-159] 114 (04/08 0900) Resp:  [13-33] 22 (04/08 0900) BP: (73-148)/(35-124) 95/55 (04/08 0815) SpO2:  [83 %-100 %] 92 % (04/08 0900) Weight:  [276 lb (125.2 kg)] 276 lb (125.2 kg) (04/08 0500) HEMODYNAMICS: CVP:  [4 mmHg-11 mmHg] 11 mmHg    INTAKE / OUTPUT: Intake/Output      04/07 0701 - 04/08 0700 04/08 0701 - 04/09 0700   P.O. 480    I.V. (mL/kg) 948.9 (7.6) 44.5 (0.4)   IV Piggyback 422    Total Intake(mL/kg) 1850.9 (14.8) 44.5 (0.4)   Other 4068 334   Stool     Total Output 4068  334   Net -2217.1 -289.5          PHYSICAL EXAMINATION: Physical Exam  Constitutional: She is oriented to person, place, and time. No distress.  HENT:  Head: Normocephalic and atraumatic.  No tenderness to palpation of the frontal, maxillary, or ethmoid sinuses.  Eyes: Conjunctivae are normal. No scleral icterus.  Neck:  L IJ catheter, R HD catheter  Cardiovascular: An irregularly irregular rhythm present. Tachycardia present.   Irregularly irregular, rate 100s-110s  Pulmonary/Chest: Effort normal. No respiratory  distress. She has no rales.  Abdominal: Soft. She exhibits no distension.  Musculoskeletal: She exhibits edema.  Neurological: She is alert and oriented to person, place, and time.  Skin: Skin is warm and dry. She is not diaphoretic.  Psychiatric: Affect normal.    LABS: Cbc  Recent Labs Lab 01/13/17 0358 01/13/17 0657 01/14/17 0408 01/15/17 0514  WBC 5.5  --  5.3 5.7  HGB 8.5* 9.5* 8.6* 9.2*  HCT 28.4* 28.0* 30.1* 32.1*  PLT 237  --  197 223    Chemistry   Recent Labs Lab 01/14/17 0408  01/15/17 0514 01/15/17 1600 01/16/17 0401  NA 137  < > 137 137 137  K 4.2  < > 3.9 4.4 3.9  CL 99*  < > 99* 99* 99*  CO2 27  < > 26 25 25   BUN <5*  < > 5* 6 6  CREATININE 1.54*  < > 1.54* 1.61* 1.49*  CALCIUM 9.1  < > 9.1 9.2 9.2  MG 2.6*  --  2.4  --  2.5*  PHOS 2.4*  < > 2.1* 3.7 2.7  GLUCOSE 89  < > 94 99 88  < > = values in this interval not displayed.  Liver fxn  Recent Labs Lab 01/15/17 0514 01/15/17 1600 01/16/17 0401  ALBUMIN 2.8* 2.8* 2.7*   Cardiac markers  Recent Labs Lab 01/10/17 0515  CKTOTAL 27*    DIAGNOSES: Principal Problem:   VRE bacteremia Active Problems:   Essential hypertension   PAF (paroxysmal atrial fibrillation) (HCC)   Obstructive sleep apnea   Morbid obesity (HCC)   Obesity hypoventilation syndrome (HCC)   COPD mixed type (HCC)   Chronic respiratory failure with hypoxia (HCC)   Acute kidney injury superimposed on CKD (HCC)   Acute on chronic combined systolic and diastolic CHF (congestive heart failure) (HCC)   Hyperkalemia   Thoracic aortic atherosclerosis (HCC)   Cardiorenal syndrome   Acute hypoxemic respiratory failure (HCC)   History of conscious sedation   Acute pulmonary edema (HCC)   Severe sepsis with septic shock ([place])   Hemodialysis catheter infection ([place])   PAH (pulmonary artery hypertension)   ASSESSMENT / PLAN:  Ms. Merced is a 61 year old woman with poorly controlled OHS, chronic combined systolic and  diastolic CHF, a-fib/a-flutter on chronic anticoagulation, hypertension, COPD, CKD Stage 3 hospitalized for acute decompensated CHF complicated by cardiorenal syndrome on CRRT and now E. Faecalis bacteremia now with trial of CRRT.  Acute on chronic hypoxemic hypercapneic respiratory failure: Multifactorial from OSA/OHS and COPD as well as pulmonary edema from CHF. -Continue CPAP daily at bedtime  Acute on chronic combined diastolic CHF with volume overload: Unable to tolerate beta-blocker, ACEi/ARB due to her other concurrent illnesses. Net -29.3L since admission. -Weaning Levophed as tolerated though BP soft this morning.  A-fib/a-flutter on on chronic anticoagulation: Amiodarone gtt per Cardiology. -AV nodal ablation possible pending improvement in infection.  Acute on chronic renal failure Stage IV: Per Nephrology, transitioning to  intermittent HD.  -Follow electrolytes  Normocytic anemia: Hb 9.2 yesterday, baseline 7-8. Suspect chronic renal insufficiency.  E. Faecalis bacteremia / VRE: Sensitive to ampicillin and started on ceftriaxone 4/5 for aggressive therapy as she could not have all catheters removed. Repeat cultures 4/2 without growth to date x 5 days. -Continue ampicillin and ceftriaxone for total 2-week therapy through April 18th  Anxiety: Continue Xanax as needed  Ear pressure: Possibly congestion-related. Continue Afrin spray as needed.  Charlott Rakes, PGY3 Internal Medicine Pager: [phone]   Attending Note:  I have examined patient, reviewed labs, studies and notes. I have discussed the case with Dr Posey Pronto, and I agree with the data and plans as amended above. 61 year old woman with obstructive sleep apnea obesity hypoventilation syndrome, combined systolic and diastolic CHF, atrial fibrillation/flutter, COPD. Hospitalized initially with acute on chronic decompensated CHF with anasarca. Course was compensated by VRE bacteremia. She has had massive volume removal.  Planning for discontinuation of CVVHD today. On my evaluation she is comfortable, sitting up in a chair. Lungs are distant but clear. Heart is irregular with a rate in the 110's. Agree with discontinuation of CVVH. She likely will not have renal recovery and will require intermittent HD. She has a tunneled dialysis catheter in place that will not be removed unless her blood cultures become positive (not the case to date). Continue efforts at rate control. Continue anticoagulation. She will be on ampicillin and ceftriaxone for another 10 days. She may be a candidate at some point for AV nodal ablation. Independent critical care time is 31 minutes.   Baltazar Apo, MD, PhD 01/16/2017, 2:30 PM [facility] [phone] or if no answer [phone]

## 2017-01-16 NOTE — Progress Notes (Signed)
ANTICOAGULATION CONSULT NOTE - Follow Up Consult  Pharmacy Consult for heparin Indication: atrial fibrillation  Labs:  Recent Labs  01/14/17 0408 01/14/17 1015  01/15/17 0514 01/15/17 1600 01/16/17 0401  HGB 8.6*  --   --  9.2*  --   --   HCT 30.1*  --   --  32.1*  --   --   PLT 197  --   --  223  --   --   HEPARINUNFRC 0.70 0.67  --  0.56  --  0.50  CREATININE 1.54*  --   < > 1.54* 1.61* 1.49*  < > = values in this interval not displayed.   Assessment: 61yo female now at goal on heparin after multiple rate adjustments; it has been difficult reaching and maintaining goal on this pt the past couple days. Heparin drip 2500 uts/hr HL 0.5 cbc slightly lower today - watch   Goal of Therapy:  Heparin level 0.3-0.7 units/ml   Plan:  Continue heparin infusion at 2500 units/hr Daily CBC and heparin level  Bonnita Nasuti Pharm.D. CPP, BCPS Clinical Pharmacist [phone] 01/16/2017 10:45 AM

## 2017-01-16 NOTE — Progress Notes (Addendum)
Advanced Heart Failure Rounding Note  PCP: Kerin Perna, NP  Primary Cardiologist: Dr. Gwenlyn Found   Subjective:    Failed DC-CV. 4/5   Remains in AF 100-120. On amio and heparin gtt.   Remains on CVVHD. Back on norepi 3. SBP 80-90s. No recurrent fevers. CVP 7.   Weight recorded as 276 this am. (down 137 pounds since admit)     Objective:   Weight Range: 125.2 kg (276 lb) Body mass index is 42.59 kg/m.   Vital Signs:   Temp:  [97.5 F (36.4 C)-98.5 F (36.9 C)] 98.5 F (36.9 C) (04/08 0828) Pulse Rate:  [87-159] 112 (04/08 1000) Resp:  [13-33] 19 (04/08 1000) BP: (73-148)/(35-124) 89/66 (04/08 1000) SpO2:  [83 %-100 %] 95 % (04/08 1000) Weight:  [125.2 kg (276 lb)] 125.2 kg (276 lb) (04/08 0500) Last BM Date: 01/15/17  Weight change: Filed Weights   01/13/17 0500 01/14/17 0500 01/16/17 0500  Weight: 133.8 kg (295 lb) 132.9 kg (293 lb) 125.2 kg (276 lb)    Intake/Output:   Intake/Output Summary (Last 24 hours) at 01/16/17 1012 Last data filed at 01/16/17 1000  Gross per 24 hour  Intake           1808.4 ml  Output             4286 ml  Net          -2477.6 ml     Physical Exam: General:  Lying in bed. NAD HEENT: normal anicteric Neck: supple R chest tunneled cath. LIJ TLC  No JVD Cor: IRR iRR tachy  No obvious murmur Lungs: clear anteriorly  No wheeze Abdomen: markedly obese. nondistended nontender good BS Extremities: no cyanosis, clubbing, rash, trace edema Neuro: alert & orientedx3, cranial nerves grossly intact. moves all 4 extremities w/o difficulty. Affect pleasant   Telemetry: Afib 100-120 Personally reviewed    Labs: CBC  Recent Labs  01/14/17 0408 01/15/17 0514  WBC 5.3 5.7  HGB 8.6* 9.2*  HCT 30.1* 32.1*  MCV 90.7 90.7  PLT 197 081   Basic Metabolic Panel  Recent Labs  01/15/17 0514 01/15/17 1600 01/16/17 0401  NA 137 137 137  K 3.9 4.4 3.9  CL 99* 99* 99*  CO2 26 25 25   GLUCOSE 94 99 88  BUN 5* 6 6  CREATININE  1.54* 1.61* 1.49*  CALCIUM 9.1 9.2 9.2  MG 2.4  --  2.5*  PHOS 2.1* 3.7 2.7    BNP: BNP (last 3 results)  Recent Labs  09/18/16 1126 11/07/16 1633 12/26/16 1643  BNP 991.0* 490.4* 1,116.0*    Medications:     Scheduled Medications: . ampicillin (OMNIPEN) IV  2 g Intravenous Q8H  . bisacodyl  10 mg Oral Daily  . cefTRIAXone (ROCEPHIN)  IV  2 g Intravenous Q12H  . Chlorhexidine Gluconate Cloth  6 each Topical Daily  . darbepoetin (ARANESP) injection - NON-DIALYSIS  150 mcg Subcutaneous Q Tue-1800  . mouth rinse  15 mL Mouth Rinse BID  . midodrine  10 mg Oral TID WC  . multivitamin  1 tablet Oral QHS  . senna-docusate  2 tablet Oral QHS  . sodium chloride flush  10-40 mL Intracatheter Q12H  . sodium chloride flush  3 mL Intravenous Q12H    Infusions: . sodium chloride    . amiodarone 30 mg/hr (01/16/17 1000)  . heparin 2,500 Units/hr (01/16/17 1000)  . norepinephrine (LEVOPHED) Adult infusion 3 mcg/min (01/16/17 1000)  . dialysis replacement fluid (  prismasate) 300 mL/hr at 01/16/17 0206  . dialysis replacement fluid (prismasate) 300 mL/hr at 01/16/17 0206  . dialysate (PRISMASATE) 1,500 mL/hr at 01/16/17 0845    PRN Medications: Place/Maintain arterial line **AND** sodium chloride, Place/Maintain arterial line **AND** sodium chloride, acetaminophen, ALPRAZolam, diphenhydrAMINE-zinc acetate, heparin, heparin, ondansetron (ZOFRAN) IV, oxyCODONE, oxymetazoline, polyethylene glycol, promethazine, sodium chloride, sodium chloride flush, sodium chloride flush, sorbitol   Assessment/Plan   1. Acute on chronic systolic/diastolic CHF Volume status managed by with CVVHD. Per Nephrology.  - EF 25-30% on TEE 4/5 (down from 55% in 3/18) 2. Paroxysmal atrial fibrillation on Coumadin    -s/p DC-CV on 3/27    - failed DC-CV on 4/5 Remain on heparin drip. Hemoglobin stable.  3. Acute on chronic kidney disease stage IV Remains on CVVHD per Nephrology.  4. Obesity hypoventilation  syndrome.  5. Hyperkalemia 6. PAH with acute RV failure/cor pulmonale 7. Acute on chronic respiratory failure 8. Anemia- Hemoglobin 8.6 9. NSVT- K 4.2 10. Septic Shock- Blood CX--> VRE- Repeat Blood CX negative. ID following.  Off norepi. Improved  Failed DC-CV earlier this week. Remains in AF rate still elevated but somewhat improved. Has been on IV amio for about 2 weeks. D/w Dr. Lovena Le (EP). Recommends more amio. Not candidate for AVN ablation/CRT with need for HD and recent enterococcus bacteremia. Will continue heparin. D/w PharmD.   LVEF down in setting of AF with RVR.Now back on norepi for BP support. Unable to use bb or ACE/ARB. Volume status controlled with HD.   Volume status now low and Requiring norepi for BP support. Renal stopping CVVHD after > 130 pound fluid removal.   Remains on abx for enteroccocus bacteremia. TEE negative for vegetations. No evidence of ongoing infection. ID following  Next step will be to see if she can tolerate iHD. May be a challenge with decreased EF and PAH. Continue midodrine for BP support  Will likely have to run volume status higher to support BP in setting of RV failure.  Hemoglobin stable at 9.2   Bensimhon, Quillian Quince, MD  10:12 AM Length of Stay: 16 Advanced Heart Failure Team  Pager [phone] M-F 7am-4pm.  Please contact [facility] for night-coverage after hours (4p -7a ) and weekends on [email or web address]

## 2017-01-16 NOTE — Progress Notes (Signed)
Pt refusing cpap tonight. I told her to call if she wants to try later

## 2017-01-16 NOTE — Progress Notes (Signed)
Subjective: Interval History: has no complaint .  Objective: Vital signs in last 24 hours: Temp:  [97.5 F (36.4 C)-98.5 F (36.9 C)] 97.9 F (36.6 C) (04/08 0400) Pulse Rate:  [95-159] 159 (04/08 0600) Resp:  [13-33] 18 (04/08 0600) BP: (73-148)/(46-124) 77/59 (04/08 0600) SpO2:  [87 %-100 %] 87 % (04/08 0600) Weight:  [125.2 kg (276 lb)] 125.2 kg (276 lb) (04/08 0500) Weight change:   Intake/Output from previous day: 04/07 0701 - 04/08 0700 In: 1806.4 [P.O.:480; I.V.:904.4; IV Piggyback:422] Out: 3929  Intake/Output this shift: Total I/O In: 543.8 [I.V.:443.8; IV Piggyback:100] Out: 1540 [Other:1540]  General appearance: alert, cooperative, no distress and morbidly obese Resp: diminished breath sounds bilaterally Cardio: S1, S2 normal and systolic murmur: holosystolic 2/6, blowing at apex, irreg GI: massive, pos bs,  Extremities: edema 2+  Lab Results:  Recent Labs  01/14/17 0408 01/15/17 0514  WBC 5.3 5.7  HGB 8.6* 9.2*  HCT 30.1* 32.1*  PLT 197 223   BMET:  Recent Labs  01/15/17 1600 01/16/17 0401  NA 137 137  K 4.4 3.9  CL 99* 99*  CO2 25 25  GLUCOSE 99 88  BUN 6 6  CREATININE 1.61* 1.49*  CALCIUM 9.2 9.2   No results for input(s): PTH in the last 72 hours. Iron Studies: No results for input(s): IRON, TIBC, TRANSFERRIN, FERRITIN in the last 72 hours.  Studies/Results: Dg Chest Port 1 View  Result Date: 01/15/2017 CLINICAL DATA:  Central line placement EXAM: PORTABLE CHEST 1 VIEW COMPARISON:  Five days ago FINDINGS: Dialysis catheter on the right with tip at the SVC. Left IJ central line with tip at the upper SVC. Marked cardiomegaly. Interstitial opacity seen previously is essentially resolved. No asymmetric airspace disease, effusion, or pneumothorax. IMPRESSION: 1. Stable positioning of central lines, tips overlapping the SVC. 2. Pulmonary edema seen 5 days ago is improved/resolved. Electronically Signed   By: Monte Fantasia M.D.   On: 01/15/2017  07:31    I have reviewed the patient's current medications.  Assessment/Plan: 1 AKI/CKD4, ? ESRD.  Oliguric.  Down 60 kg.  Still some edema but bp low .CVP 7.  Will stop CRRt today and see how bp does. If still on NE may not tol conventional HD. 2 Anemia ESA, FE 3 HPTH vit D 4 DM controlled 5 CM still primary issue 6 Afib on Amio, not sure why not ablation candidate 7 Massive obesity 8 VRE sepsis on Dapto] P stop CRRT, cont esa, vit D,  amio, AB    LOS: 21 days   Paige Marshall L 01/16/2017,6:58 AM

## 2017-01-17 ENCOUNTER — Inpatient Hospital Stay (HOSPITAL_COMMUNITY): Payer: Medicare Other

## 2017-01-17 LAB — RENAL FUNCTION PANEL
ALBUMIN: 3 g/dL — AB (ref 3.5–5.0)
ALBUMIN: 3 g/dL — AB (ref 3.5–5.0)
ANION GAP: 11 (ref 5–15)
Anion gap: 17 — ABNORMAL HIGH (ref 5–15)
BUN: 6 mg/dL (ref 6–20)
BUN: 8 mg/dL (ref 6–20)
CHLORIDE: 99 mmol/L — AB (ref 101–111)
CHLORIDE: 97 mmol/L — AB (ref 101–111)
CO2: 26 mmol/L (ref 22–32)
CO2: 25 mmol/L (ref 22–32)
CREATININE: 2.11 mg/dL — AB (ref 0.44–1.00)
Calcium: 9.4 mg/dL (ref 8.9–10.3)
Calcium: 9.9 mg/dL (ref 8.9–10.3)
Creatinine, Ser: 1.65 mg/dL — ABNORMAL HIGH (ref 0.44–1.00)
GFR calc Af Amer: 39 mL/min — ABNORMAL LOW (ref >60)
GFR calc Af Amer: 29 mL/min — ABNORMAL LOW (ref >60)
GFR, EST NON AFRICAN AMERICAN: 33 mL/min — AB (ref >60)
GFR, EST NON AFRICAN AMERICAN: 25 mL/min — AB (ref >60)
GLUCOSE: 101 mg/dL — AB (ref 65–99)
Glucose, Bld: 90 mg/dL (ref 65–99)
PHOSPHORUS: 2.6 mg/dL (ref 2.5–4.6)
POTASSIUM: 4.1 mmol/L (ref 3.5–5.1)
Phosphorus: 3.1 mg/dL (ref 2.5–4.6)
Potassium: 4.4 mmol/L (ref 3.5–5.1)
SODIUM: 139 mmol/L (ref 135–145)
Sodium: 136 mmol/L (ref 135–145)

## 2017-01-17 LAB — BLOOD GAS, ARTERIAL
ACID-BASE DEFICIT: 8.4 mmol/L — AB (ref 0.0–2.0)
Bicarbonate: 16 mmol/L — ABNORMAL LOW (ref 20.0–28.0)
Drawn by: 406621
O2 Content: 4 L/min
O2 Saturation: 99.3 %
PCO2 ART: 29.2 mmHg — AB (ref 32.0–48.0)
PH ART: 7.358 (ref 7.350–7.450)
Patient temperature: 98.6
pO2, Arterial: 145 mmHg — ABNORMAL HIGH (ref 83.0–108.0)

## 2017-01-17 LAB — CBC
HCT: 34.8 % — ABNORMAL LOW (ref 36.0–46.0)
Hemoglobin: 10.1 g/dL — ABNORMAL LOW (ref 12.0–15.0)
MCH: 26.7 pg (ref 26.0–34.0)
MCHC: 29 g/dL — AB (ref 30.0–36.0)
MCV: 92.1 fL (ref 78.0–100.0)
PLATELETS: 252 10*3/uL (ref 150–400)
RBC: 3.78 MIL/uL — AB (ref 3.87–5.11)
RDW: 25 % — AB (ref 11.5–15.5)
WBC: 8.2 10*3/uL (ref 4.0–10.5)

## 2017-01-17 LAB — HEPARIN LEVEL (UNFRACTIONATED): Heparin Unfractionated: 0.64 IU/mL (ref 0.30–0.70)

## 2017-01-17 LAB — MAGNESIUM: MAGNESIUM: 2.6 mg/dL — AB (ref 1.7–2.4)

## 2017-01-17 MED ORDER — NOREPINEPHRINE BITARTRATE 1 MG/ML IV SOLN
0.0000 ug/min | INTRAVENOUS | Status: DC
  Filled 2017-01-17: qty 4

## 2017-01-17 MED ORDER — SODIUM CHLORIDE 0.9 % IV SOLN
2.0000 g | Freq: Two times a day (BID) | INTRAVENOUS | Status: DC
  Administered 2017-01-17 – 2017-01-22 (×10): 2 g via INTRAVENOUS
  Filled 2017-01-17 (×11): qty 2000

## 2017-01-17 MED ORDER — FLUTICASONE PROPIONATE 50 MCG/ACT NA SUSP
1.0000 | Freq: Two times a day (BID) | NASAL | Status: DC
  Administered 2017-01-17 – 2017-01-19 (×4): 1 via NASAL
  Filled 2017-01-17: qty 16

## 2017-01-17 MED ORDER — NOREPINEPHRINE BITARTRATE 1 MG/ML IV SOLN: 0.0000 ug/min | INTRAVENOUS | Status: DC

## 2017-01-17 NOTE — Plan of Care (Signed)
Problem: Safety: Goal: Ability to remain free from injury will improve Outcome: Progressing Bed in low position. Call light in reach

## 2017-01-17 NOTE — Progress Notes (Signed)
Assessment/Plan: 1 AKI/CKD4, ? ESRD.  Oliguric.  Down 60 kg.   2 Anemia ESA, FE 3 HPTH vit D 4 DM controlled 5 CM still primary issue 6 Afib on Amio, not sure why not ablation candidate 7 Massive obesity 8 VRE sepsis on Dapto] P Cont CRRT, CXR, ABG, keep even        Subjective: Interval History: agitated, SOB  Objective: Vital signs in last 24 hours: Temp:  [97.2 F (36.2 C)-98 F (36.7 C)] 97.6 F (36.4 C) (04/09 0800) Pulse Rate:  [85-219] 136 (04/09 0930) Resp:  [11-30] 22 (04/09 0930) BP: (48-166)/(30-143) 83/53 (04/09 0930) SpO2:  [90 %-100 %] 95 % (04/09 0930) Weight:  [124.7 kg (275 lb)] 124.7 kg (275 lb) (04/09 0500) Weight change: -0.454 kg (-1 lb)  Intake/Output from previous day: 04/08 0701 - 04/09 0700 In: 1794.2 [P.O.:540; I.V.:1004.2; IV Piggyback:250] Out: 3939  Intake/Output this shift: Total I/O In: 91.7 [I.V.:41.7; IV Piggyback:50] Out: 276 [Other:276]  General appearance: alert and agitated Resp: clear to auscultation bilaterally Cardio: irregularly irregular rhythm Extremities: edema chronic edema changes  Lab Results:  Recent Labs  01/15/17 0514  WBC 5.7  HGB 9.2*  HCT 32.1*  PLT 223   BMET:  Recent Labs  01/16/17 1623 01/17/17 0356  NA 138 136  K 4.5 4.1  CL 99* 99*  CO2 26 26  GLUCOSE 104* 90  BUN 7 6  CREATININE 1.64* 1.65*  CALCIUM 9.4 9.4   No results for input(s): PTH in the last 72 hours. Iron Studies: No results for input(s): IRON, TIBC, TRANSFERRIN, FERRITIN in the last 72 hours. Studies/Results: No results found.  Scheduled: . ampicillin (OMNIPEN) IV  2 g Intravenous Q8H  . bisacodyl  10 mg Oral Daily  . cefTRIAXone (ROCEPHIN)  IV  2 g Intravenous Q12H  . Chlorhexidine Gluconate Cloth  6 each Topical Daily  . darbepoetin (ARANESP) injection - NON-DIALYSIS  150 mcg Subcutaneous Q Tue-1800  . mouth rinse  15 mL Mouth Rinse BID  . midodrine  10 mg Oral TID WC  . multivitamin  1 tablet Oral QHS  . senna-docusate   2 tablet Oral QHS  . sodium chloride flush  10-40 mL Intracatheter Q12H  . sodium chloride flush  3 mL Intravenous Q12H    LOS: 22 days   Estefanny Moler C 01/17/2017,9:47 AM

## 2017-01-17 NOTE — Progress Notes (Signed)
ANTICOAGULATION CONSULT NOTE - Follow Up Consult  Pharmacy Consult for heparin Indication: atrial fibrillation  Labs:  Recent Labs  01/15/17 0514  01/16/17 0401 01/16/17 1623 01/17/17 0356  HGB 9.2*  --   --   --   --   HCT 32.1*  --   --   --   --   PLT 223  --   --   --   --   HEPARINUNFRC 0.56  --  0.50  --  0.64  CREATININE 1.54*  < > 1.49* 1.64* 1.65*  < > = values in this interval not displayed.   Assessment: 61yo female now at goal on heparin after multiple rate adjustments; it has been difficult reaching and maintaining goal on this pt the past couple days.  Heparin drip 2500 uts/hr HL 0.6 cbc not done this morning, will order next with pm renal panel then daily  Goal of Therapy:  Heparin level 0.3-0.7 units/ml   Plan:  Continue heparin infusion at 2500 units/hr Daily CBC and heparin level  Erin Hearing PharmD., BCPS Clinical Pharmacist Pager [phone] 01/17/2017 9:36 AM

## 2017-01-17 NOTE — Progress Notes (Signed)
Paged MD Florene Glen to report ABG and chest xray results.  MD advised okay to stop CRRT.

## 2017-01-17 NOTE — Progress Notes (Signed)
Patient's blood pressure continues to be low even after stopping CRRT and midodrine given.  Patient asymptomatic.  Called [facility] and spoke with MD Elsworth Soho to discuss if Levophed should be restarted, MD Elsworth Soho advised to restart Levophed and to try and titrate it off again as blood pressure allows.

## 2017-01-17 NOTE — Progress Notes (Signed)
Patient refused CPAP for tonight. RT advised patient to have RT called if she changes her mind.

## 2017-01-17 NOTE — Progress Notes (Signed)
PHARMACY NOTE:  ANTIMICROBIAL RENAL DOSAGE ADJUSTMENT  Current antimicrobial regimen includes a mismatch between antimicrobial dosage and estimated renal function.  As per policy approved by the Pharmacy & Therapeutics and Medical Executive Committees, the antimicrobial dosage will be adjusted accordingly.  Current antimicrobial dosage:  Ampicillin 2 gm every 8 hours   Indication: VRE Bacteremia  Renal Function:  Estimated Creatinine Clearance: 51.4 mL/min (A) (by C-G formula based on SCr of 1.65 mg/dL (H)).  CRRT switched off this morning. Will dose as if patient is going to be on HD.     Antimicrobial dosage has been changed to:  Ampicillin 2 gm every 12 hours   Additional comments: Will continue to monitor renal plans and adjust accordingly.    Thank you for allowing pharmacy to be a part of this patient's care.  Uvaldo Bristle, PharmD PGY1 Pharmacy Resident  Pager: [phone] 01/17/2017 12:55 PM

## 2017-01-17 NOTE — Progress Notes (Signed)
Advanced Heart Failure Rounding Note  PCP: Kerin Perna, NP  Primary Cardiologist: Dr. Gwenlyn Found   Subjective:    Failed DC-CV. 4/5   Remains in AF 100-120. On amio and heparin gtt.   CVVHD stopped yesterday. Able tto wean norepi of this am. CVP 7-9   Denies fevers, chills, SOB, orthopnea. Anxious to go home. She remains upbeat.   Weight recorded as 275 this am. (down 138 pounds since admit)     Objective:   Weight Range: 124.7 kg (275 lb) Body mass index is 42.44 kg/m.   Vital Signs:   Temp:  [97.2 F (36.2 C)-98.5 F (36.9 C)] 97.5 F (36.4 C) (04/09 0400) Pulse Rate:  [85-219] 91 (04/09 0500) Resp:  [11-30] 15 (04/09 0500) BP: (63-136)/(35-110) 102/53 (04/09 0500) SpO2:  [83 %-100 %] 97 % (04/09 0500) Weight:  [124.7 kg (275 lb)] 124.7 kg (275 lb) (04/09 0500) Last BM Date: 01/15/17  Weight change: Filed Weights   01/14/17 0500 01/16/17 0500 01/17/17 0500  Weight: 132.9 kg (293 lb) 125.2 kg (276 lb) 124.7 kg (275 lb)    Intake/Output:   Intake/Output Summary (Last 24 hours) at 01/17/17 0538 Last data filed at 01/17/17 0500  Gross per 24 hour  Intake           1799.8 ml  Output             3966 ml  Net          -2166.2 ml     Physical Exam: General:  Lying in bed NAD HEENT: normal anicteric Neck: supple R chest tunneled cath. LIJ TLC  JVP 8 Cor: IRR IRR tachy  Lungs: clear no wheezing  Abdomen: obese. nondistended nontender  Good  BS Extremities: no cyanosis, clubbing, rash, mild edema. Warm  Neuro: alert & orientedx3, cranial nerves grossly intact. moves all 4 extremities w/o difficulty. Affect pleasant   Telemetry: Afib 100-120  Personally reviewed    Labs: CBC  Recent Labs  01/15/17 0514  WBC 5.7  HGB 9.2*  HCT 32.1*  MCV 90.7  PLT 789   Basic Metabolic Panel  Recent Labs  01/16/17 0401 01/16/17 1623 01/17/17 0356  NA 137 138 136  K 3.9 4.5 4.1  CL 99* 99* 99*  CO2 25 26 26   GLUCOSE 88 104* 90  BUN 6 7 6     CREATININE 1.49* 1.64* 1.65*  CALCIUM 9.2 9.4 9.4  MG 2.5*  --  2.6*  PHOS 2.7 2.6 2.6    BNP: BNP (last 3 results)  Recent Labs  09/18/16 1126 11/07/16 1633 12/26/16 1643  BNP 991.0* 490.4* 1,116.0*    Medications:     Scheduled Medications: . ampicillin (OMNIPEN) IV  2 g Intravenous Q8H  . bisacodyl  10 mg Oral Daily  . cefTRIAXone (ROCEPHIN)  IV  2 g Intravenous Q12H  . Chlorhexidine Gluconate Cloth  6 each Topical Daily  . darbepoetin (ARANESP) injection - NON-DIALYSIS  150 mcg Subcutaneous Q Tue-1800  . mouth rinse  15 mL Mouth Rinse BID  . midodrine  10 mg Oral TID WC  . multivitamin  1 tablet Oral QHS  . senna-docusate  2 tablet Oral QHS  . sodium chloride flush  10-40 mL Intracatheter Q12H  . sodium chloride flush  3 mL Intravenous Q12H    Infusions: . sodium chloride    . amiodarone 30 mg/hr (01/17/17 0500)  . heparin 2,500 Units/hr (01/17/17 0500)  . norepinephrine (LEVOPHED) Adult infusion Stopped (01/17/17 0200)  .  dialysis replacement fluid (prismasate) 300 mL/hr at 01/16/17 1952  . dialysis replacement fluid (prismasate) 300 mL/hr at 01/16/17 1952  . dialysate (PRISMASATE) 1,500 mL/hr at 01/17/17 0512    PRN Medications: Place/Maintain arterial line **AND** sodium chloride, Place/Maintain arterial line **AND** sodium chloride, acetaminophen, ALPRAZolam, diphenhydrAMINE-zinc acetate, heparin, heparin, ondansetron (ZOFRAN) IV, oxyCODONE, oxymetazoline, polyethylene glycol, promethazine, sodium chloride, sodium chloride flush, sodium chloride flush, sorbitol   Assessment/Plan   1. Acute on chronic systolic/diastolic CHF - Down 510 pounds with CVVHD. CVP 7-9 - BP improving. Given RVF would not try to push dry weight or CVP down any farther.  - EF 25-30% on TEE 4/5 (down from 55% in 3/18) -> suspect tachy related from AF - No bb, ACE with low BP and ARF 2. Paroxysmal atrial fibrillation on Coumadin    -s/p DC-CV on 3/27    - failed DC-CV on 4/5     - Remain on heparin drip. Will start coumadin    - Not candidate for AVN ablation and CRT-D with need for HD and recent bacteremia 3. Acute on chronic kidney disease stage IV    - As above CVVHD on hold. I suspect she may tolerate iHD soon as long as we let dry weight rise a bit    - Continue midodrine  4. Obesity hypoventilation syndrome.  5. Hyperkalemia    --resolved 6. PAH with acute RV failure/cor pulmonale    --Combination WHO GROUP II & III 7. Acute on chronic respiratory failure 8. Anemia 9. NSVT    - Resolved K 4.1 today 10. Septic Shock- Blood CX-->  - VRE - Repeat Blood CX negative. ID following.  - TEE no endocarditis   Failed DC-CV last week. Remains in AF rate still elevated but somewhat improved. Has been on IV amio for about 2 weeks. D/w Dr. Lovena Le (EP). Recommends more amio. Can likely switch to po later this week. Not candidate for AVN ablation/CRT with need for HD and recent enterococcus bacteremia. Will continue heparin. Start coumadin    Glori Bickers, MD  5:38 AM Length of Stay: 16 Advanced Heart Failure Team  Pager [phone] M-F 7am-4pm.  Please contact [facility] for night-coverage after hours (4p -7a ) and weekends on [email or web address]

## 2017-01-17 NOTE — Progress Notes (Signed)
PULMONARY  / CRITICAL CARE MEDICINE  Name: Paige Marshall MRN: [id number] DOB: 1956/09/09    LOS: 55  REFERRING MD :  IMTS  CHIEF COMPLAINT:  Shortness of breath  BRIEF PATIENT DESCRIPTION: Paige Marshall is a 61 year old woman with poorly controlled OHS, chronic combined systolic and diastolic CHF, a-fib/a-flutter on chronic anticoagulation, hypertension, COPD, CKD Stage 3 hospitalized for acute decompensated CHF complicated by cardiorenal syndrome on CRRT and now E. Faecalis bacteremia.   LINES / TUBES: 3/31 R IJ HD catheter 4/01 R femoral arterial line >> 4/04 4/02 L IJ  CULTURES: 4/01 E. Faecalis [R to vancomycin, S to ampicilin, gentamicin, linezolid] 4/02 Negative  ANTIBIOTICS: 4/01 Vanc/cefepime >> daptomycin 4/03 Daptomycin >> ampicillin [thru 4/18] 4/05 Ceftriaxone [thru 4/18]  SIGNIFICANT EVENTS:  3/18: Admitted for acute decompensated CHF 3/21: CRRT initiated for diuresis  3/27: NSR s/p DCCV. 3/29: Transferred to stepdown  3/31: Developed fever and PCCM re-consulted  4/06: DCCV attempted and failed. 4/08: Off CRRT.  DIET:  Heart Healthy with fluid restriction  DVT Px:  Heparin IV  GI Px:  None  HISTORY OF PRESENT ILLNESS:  61 y/o F admitted from rehab facility (resident since November 2017) on 3/18 with complaints of shortness of breath & lower extremity swelling.    The patient has a medical history of morbid obesity / wheelchair bound, untreated OHS, chronic combined systolic / diastolic CHF, NICM, AF on coumadin, HTN, 3L O2 dependent, pulmonary hypertension, former smoker (11 pk years) with COPD & CKD III.  The patient suffered a left fibular fracture in late 2017.  She was recently admitted from 1/28-11/13/16 for acute CHF exacerbation.  At that time, her discharge weight was 370lbs.    She returned from the rehab facility on 3/18 with increased SOB and lower extremity swelling.  She if followed by Dr. Gwenlyn Found for cardiology and was recently switched to  lasix and metolazone.  Lasix was discontinued at the SNF due to rising sr cr.  Her admit weight was 411lbs.  She was admitted by IMTS for decompensated CHF.  She developed worsening AKI with concern for cardio-renal syndrome.  An HD catheter was inserted 3/21 and CVVHD was initiated.   She remains on milrinone, amiodarone and heparin gtt's.  Rate control has been difficult to achieve.  Over the past 24 hours, she is net negative 3.5L.  She developed respiratory distress am of 3/22 and BiPAP was initiated.    Sent to sdu 3/29, returned ICU , shock, CVVHD needs, fever > VRE bacteremia.  INTERVAL HISTORY: This morning, she is feeling short of breath out of bed. She acknowledges not wearing her nighttime CPAP on account of ear pressure. RN attempting to assess O2 sats in the room with pulse oximetry. Levophed titrated off.  VITAL SIGNS: Temp:  [97.2 F (36.2 C)-98 F (36.7 C)] 97.6 F (36.4 C) (04/09 0800) Pulse Rate:  [85-219] 102 (04/09 0730) Resp:  [11-30] 20 (04/09 0830) BP: (48-136)/(30-110) 79/54 (04/09 0830) SpO2:  [90 %-100 %] 93 % (04/09 0830) Weight:  [275 lb (124.7 kg)] 275 lb (124.7 kg) (04/09 0500) HEMODYNAMICS: CVP:  [5 mmHg-9 mmHg] 5 mmHg    INTAKE / OUTPUT: Intake/Output      04/08 0701 - 04/09 0700 04/09 0701 - 04/10 0700   P.O. 540    I.V. (mL/kg) 1004.2 (8.1) 41.7 (0.3)   IV Piggyback 250 50   Total Intake(mL/kg) 1794.2 (14.4) 91.7 (0.7)   Other 3939 143   Stool 0 0  Total Output 3939 143   Net -2144.8 -51.3        Stool Occurrence 1 x 1 x     PHYSICAL EXAMINATION: Physical Exam  Constitutional: She is oriented to person, place, and time. No distress.  HENT:  Head: Normocephalic and atraumatic.  Eyes: Conjunctivae are normal. No scleral icterus.  Neck:  R IJ catheter, L IJ catheter in place  Cardiovascular:  Irregularly irregular with rates 110s-120s  Pulmonary/Chest: Breath sounds normal.  No accessory muscle use. Mildly dyspneic.  Neurological: She is  alert and oriented to person, place, and time.  Skin: She is not diaphoretic.    LABS: Cbc  Recent Labs Lab 01/13/17 0358 01/13/17 0657 01/14/17 0408 01/15/17 0514  WBC 5.5  --  5.3 5.7  HGB 8.5* 9.5* 8.6* 9.2*  HCT 28.4* 28.0* 30.1* 32.1*  PLT 237  --  197 223    Chemistry   Recent Labs Lab 01/15/17 0514  01/16/17 0401 01/16/17 1623 01/17/17 0356  NA 137  < > 137 138 136  K 3.9  < > 3.9 4.5 4.1  CL 99*  < > 99* 99* 99*  CO2 26  < > 25 26 26   BUN 5*  < > 6 7 6   CREATININE 1.54*  < > 1.49* 1.64* 1.65*  CALCIUM 9.1  < > 9.2 9.4 9.4  MG 2.4  --  2.5*  --  2.6*  PHOS 2.1*  < > 2.7 2.6 2.6  GLUCOSE 94  < > 88 104* 90  < > = values in this interval not displayed.  Liver fxn  Recent Labs Lab 01/16/17 0401 01/16/17 1623 01/17/17 0356  ALBUMIN 2.7* 3.1* 3.0*   Cardiac markers No results for input(s): CKTOTAL, CKMB, TROPONINI in the last 168 hours.  DIAGNOSES: Principal Problem:   VRE bacteremia Active Problems:   Essential hypertension   PAF (paroxysmal atrial fibrillation) (HCC)   Obstructive sleep apnea   Morbid obesity (HCC)   Obesity hypoventilation syndrome (HCC)   COPD mixed type (HCC)   Chronic respiratory failure with hypoxia (HCC)   Acute kidney injury superimposed on CKD (HCC)   Acute on chronic combined systolic and diastolic CHF (congestive heart failure) (HCC)   Hyperkalemia   Thoracic aortic atherosclerosis (HCC)   Cardiorenal syndrome   Acute hypoxemic respiratory failure (HCC)   History of conscious sedation   Acute pulmonary edema (HCC)   Severe sepsis with septic shock (HCC)   Hemodialysis catheter infection ([place])   PAH (pulmonary artery hypertension)   ASSESSMENT / PLAN:  Paige Marshall is a 61 year old woman with poorly controlled OHS, chronic combined systolic and diastolic CHF, a-fib/a-flutter on chronic anticoagulation, hypertension, COPD, CKD Stage 3 hospitalized for acute decompensated CHF complicated by cardiorenal syndrome on  CRRT and now E. Faecalis bacteremia now with trial of CRRT.  Acute on chronic hypoxemic hypercapneic respiratory failure: Multifactorial from OSA/OHS and COPD as well as pulmonary edema from CHF.  -Encourage her to use CPAP at night  Acute on chronic combined diastolic CHF with volume overload: Unable to tolerate beta-blocker, ACEi/ARB due to her other concurrent illnesses. Net -25.3L since admission. Off Levophed. -Heart Failure following, appreciate recommendations  A-fib/a-flutter on on chronic anticoagulation: AV nodal ablation possible pending improvement in infection. -Switch to warfarin today per Heart Failure -Continue amiodarone gtt per Heart Failure  Acute on chronic renal failure Stage IV: Per Nephrology, transitioning to intermittent HD.  -Follow electrolytes -Continue midodrine per Cardiology  Normocytic anemia: Baseline 7-8. Suspect  chronic renal insufficiency.  E. Faecalis bacteremia / VRE: Sensitive to ampicillin and started on ceftriaxone 4/5 for aggressive therapy as she could not have all catheters removed. Repeat cultures 4/2 without growth to date x 5 days. -Continue ampicillin and ceftriaxone for total 2-week therapy through April 18th  Anxiety: Continue Xanax as needed  Ear pressure: Possibly congestion-related. Continue Afrin spray as needed.  Charlott Rakes, PGY3 Internal Medicine Pager: [phone]

## 2017-01-18 DIAGNOSIS — I482 Chronic atrial fibrillation, unspecified: Secondary | ICD-10-CM | POA: Diagnosis present

## 2017-01-18 LAB — RENAL FUNCTION PANEL
ALBUMIN: 3 g/dL — AB (ref 3.5–5.0)
ALBUMIN: 2.8 g/dL — AB (ref 3.5–5.0)
ANION GAP: 16 — AB (ref 5–15)
Anion gap: 15 (ref 5–15)
BUN: 9 mg/dL (ref 6–20)
BUN: 16 mg/dL (ref 6–20)
CALCIUM: 10.1 mg/dL (ref 8.9–10.3)
CHLORIDE: 96 mmol/L — AB (ref 101–111)
CO2: 24 mmol/L (ref 22–32)
CO2: 24 mmol/L (ref 22–32)
CREATININE: 3.66 mg/dL — AB (ref 0.44–1.00)
Calcium: 10.1 mg/dL (ref 8.9–10.3)
Chloride: 95 mmol/L — ABNORMAL LOW (ref 101–111)
Creatinine, Ser: 2.9 mg/dL — ABNORMAL HIGH (ref 0.44–1.00)
GFR calc non Af Amer: 17 mL/min — ABNORMAL LOW (ref >60)
GFR calc non Af Amer: 13 mL/min — ABNORMAL LOW (ref >60)
GFR, EST AFRICAN AMERICAN: 19 mL/min — AB (ref >60)
GFR, EST AFRICAN AMERICAN: 15 mL/min — AB (ref >60)
GLUCOSE: 114 mg/dL — AB (ref 65–99)
Glucose, Bld: 92 mg/dL (ref 65–99)
PHOSPHORUS: 3.7 mg/dL (ref 2.5–4.6)
PHOSPHORUS: 3.9 mg/dL (ref 2.5–4.6)
POTASSIUM: 4.1 mmol/L (ref 3.5–5.1)
Potassium: 4.3 mmol/L (ref 3.5–5.1)
SODIUM: 135 mmol/L (ref 135–145)
Sodium: 135 mmol/L (ref 135–145)

## 2017-01-18 LAB — CBC
HEMATOCRIT: 35.3 % — AB (ref 36.0–46.0)
HEMOGLOBIN: 10.3 g/dL — AB (ref 12.0–15.0)
MCH: 26.7 pg (ref 26.0–34.0)
MCHC: 29.2 g/dL — ABNORMAL LOW (ref 30.0–36.0)
MCV: 91.5 fL (ref 78.0–100.0)
Platelets: 249 10*3/uL (ref 150–400)
RBC: 3.86 MIL/uL — AB (ref 3.87–5.11)
RDW: 25.1 % — ABNORMAL HIGH (ref 11.5–15.5)
WBC: 10.2 10*3/uL (ref 4.0–10.5)

## 2017-01-18 LAB — PROTIME-INR
INR: 1.33
Prothrombin Time: 16.6 seconds — ABNORMAL HIGH (ref 11.4–15.2)

## 2017-01-18 LAB — HEPARIN LEVEL (UNFRACTIONATED): Heparin Unfractionated: 0.68 IU/mL (ref 0.30–0.70)

## 2017-01-18 LAB — MAGNESIUM: MAGNESIUM: 2.6 mg/dL — AB (ref 1.7–2.4)

## 2017-01-18 MED ORDER — WARFARIN - PHARMACIST DOSING INPATIENT
Freq: Every day | Status: DC
  Administered 2017-01-18: 18:00:00

## 2017-01-18 MED ORDER — AMIODARONE HCL 200 MG PO TABS
200.0000 mg | ORAL_TABLET | Freq: Two times a day (BID) | ORAL | Status: DC
Start: 2017-01-18 — End: 2017-01-18
  Administered 2017-01-18: 200 mg via ORAL
  Filled 2017-01-18: qty 1

## 2017-01-18 MED ORDER — WARFARIN SODIUM 7.5 MG PO TABS
7.5000 mg | ORAL_TABLET | Freq: Once | ORAL | Status: AC
  Administered 2017-01-18: 7.5 mg via ORAL
  Filled 2017-01-18: qty 1

## 2017-01-18 MED ORDER — AMIODARONE HCL 200 MG PO TABS
200.0000 mg | ORAL_TABLET | Freq: Once | ORAL | Status: AC
  Administered 2017-01-18: 200 mg via ORAL
  Filled 2017-01-18: qty 1

## 2017-01-18 MED ORDER — AMIODARONE HCL 200 MG PO TABS
400.0000 mg | ORAL_TABLET | Freq: Two times a day (BID) | ORAL | Status: DC
  Administered 2017-01-18 – 2017-01-20 (×4): 400 mg via ORAL
  Filled 2017-01-18 (×4): qty 2

## 2017-01-18 NOTE — Progress Notes (Signed)
ANTICOAGULATION CONSULT NOTE - Follow Up Consult  Pharmacy Consult for heparin>>warfarin Indication: atrial fibrillation  Labs:  Recent Labs  01/16/17 0401  01/17/17 0356 01/17/17 1550 01/18/17 0333  HGB  --   --   --  10.1* 10.3*  HCT  --   --   --  34.8* 35.3*  PLT  --   --   --  252 249  HEPARINUNFRC 0.50  --  0.64  --  0.68  CREATININE 1.49*  < > 1.65* 2.11* 2.90*  < > = values in this interval not displayed.   Assessment: 61yo female now at goal on heparin after multiple rate adjustments; it has been difficult reaching and maintaining goal on this pt the past couple days.  Heparin drip 2500 units/hr HL 0.68 cbc stable, heparin at upper end of goal will reduce rate slightly. Will resume warfarin today as procedures appear to be completed. No INR since 3/30 so will check today. Patient continues on IV amio will change to po today.  Goal of Therapy:  INR goal 2-3 Heparin level 0.3-0.7 units/ml   Plan:  Reduce heparin infusion to 2400 units/hr Daily CBC and heparin level Warfarin 7.5mg  tonight  Erin Hearing PharmD., BCPS Clinical Pharmacist Pager [phone] 01/18/2017 8:44 AM

## 2017-01-18 NOTE — Progress Notes (Signed)
qPhysical Therapy Treatment Patient Details Name: Paige Marshall MRN: [id number] DOB: 10-24-1955 Today's Date: 01/18/2017    History of Present Illness 61 y.o. female with morbid obesity / wheelchair bound, untreated OHS, chronic combined systolic / diastolic CHF, NICM, AF on coumadin, HTN, 3L O2 dependent, pulmonary hypertension, COPD & CKD III.  The patient suffered a left fibular fracture in late 2017 and has been at [facility] since. Admitted 12/26/16 from SNF with acute CHF. Pt started on CVVHD that was then discontinued for HD. 3/31 pt developed septic shock and CVVHD restarted.[name] CVVHD discontinued 4/9.    PT Comments    Pt making excellent progress. Able to stand today. Pt very motivated to work toward increasing her independence.   Follow Up Recommendations  CIR     Equipment Recommendations  Other (comment) (to be assessed)    Recommendations for Other Services OT consult     Precautions / Restrictions Precautions Precautions: Fall Restrictions Weight Bearing Restrictions: No    Mobility  Bed Mobility                  Transfers Overall transfer level: Needs assistance Equipment used: Rolling walker (2 wheeled) Transfers: Sit to/from Stand Sit to Stand: +2 physical assistance;Max assist (3rd person to stabilize walker)         General transfer comment: Assist to bring hips and trunk up. On first attempt with 2 people pt only able to partially stand. With 3rd person stabilizing walker and blocking rt foot from sliding pt able to fully stand 2 more times. Allowed pt to place hands on walker handles to facilitate anterior wt shift.   Ambulation/Gait                 Stairs            Wheelchair Mobility    Modified Rankin (Stroke Patients Only)       Balance Overall balance assessment: Needs assistance Sitting-balance support: No upper extremity supported;Feet supported Sitting balance-Leahy Scale: Fair     Standing balance support:  Bilateral upper extremity supported Standing balance-Leahy Scale: Poor Standing balance comment: Pt fully stood x2 for 40 sec the first time and 15 sec the second time.                            Cognition Arousal/Alertness: Awake/alert Behavior During Therapy: WFL for tasks assessed/performed Overall Cognitive Status: Within Functional Limits for tasks assessed                                        Exercises      General Comments        Pertinent Vitals/Pain Pain Assessment: No/denies pain    Home Living                      Prior Function            PT Goals (current goals can now be found in the care plan section) Progress towards PT goals: Progressing toward goals    Frequency    Min 3X/week      PT Plan Discharge plan needs to be updated    Co-evaluation             End of Session Equipment Utilized During Treatment: Oxygen;Gait belt Activity Tolerance: Patient tolerated treatment well Patient left:  with call bell/phone within reach;in chair Nurse Communication: Mobility status;Need for lift equipment (nurse assisted with transfer) PT Visit Diagnosis: Muscle weakness (generalized) (M62.81);Difficulty in walking, not elsewhere classified (R26.2)     Time: 2585-2778 PT Time Calculation (min) (ACUTE ONLY): 31 min  Charges:  $Therapeutic Activity: 23-37 mins                    G Codes:       [facility] PT [place] 01/18/2017, 11:02 AM

## 2017-01-18 NOTE — Progress Notes (Signed)
Discussed with P.T. It is felt that pt would benefit from an inpt rehab stay rather than return to SNF after d/c. If you would like to pursue an inpt rehab admission, Please place a consult. Also recommend an OT eval. [phone]

## 2017-01-18 NOTE — Progress Notes (Signed)
PCCM Progress Note  Admission date: 12/26/2016 Consult date: 12/30/2016 Referring provider: Dr. Tommy Medal, IMTS  CC: Short of breath  HPI: 61 yo female from rehab with dyspnea and edema from acute on chronic CHF exacerbation.  Subjective: Nausea and breathing better.  Denies chest pain.  Vital signs: BP 96/69 (BP Location: Left Arm)   Pulse (!) 106   Temp 97.7 F (36.5 C) (Oral)   Resp 15   Ht 5' 7.5" (1.715 m)   Wt 265 lb (120.2 kg)   LMP 09/07/2011   SpO2 99%   BMI 40.89 kg/m   Intake/output: I/O last 3 completed shifts: In: 1940.6 [P.O.:390; I.V.:1300.6; IV Piggyback:250] Out: 2220 [Other:2220]  General: sitting in chair Neuro: normal strength HEENT: no stridor Cardiac: irregular Chest: no wheeze Abd: abd soft, non tender Ext: no edema Skin: no rashes   CMP Latest Ref Rng & Units 01/18/2017 01/17/2017 01/17/2017  Glucose 65 - 99 mg/dL 92 101(H) 90  BUN 6 - 20 mg/dL 9 8 6   Creatinine 0.44 - 1.00 mg/dL 2.90(H) 2.11(H) 1.65(H)  Sodium 135 - 145 mmol/L 135 139 136  Potassium 3.5 - 5.1 mmol/L 4.1 4.4 4.1  Chloride 101 - 111 mmol/L 96(L) 97(L) 99(L)  CO2 22 - 32 mmol/L 24 25 26   Calcium 8.9 - 10.3 mg/dL 10.1 9.9 9.4  Total Protein 6.5 - 8.1 g/dL - - -  Total Bilirubin 0.3 - 1.2 mg/dL - - -  Alkaline Phos 38 - 126 U/L - - -  AST 15 - 41 U/L - - -  ALT 14 - 54 U/L - - -     CBC Latest Ref Rng & Units 01/18/2017 01/17/2017 01/15/2017  WBC 4.0 - 10.5 K/uL 10.2 8.2 5.7  Hemoglobin 12.0 - 15.0 g/dL 10.3(L) 10.1(L) 9.2(L)  Hematocrit 36.0 - 46.0 % 35.3(L) 34.8(L) 32.1(L)  Platelets 150 - 400 K/uL 249 252 223     ABG    Component Value Date/Time   PHART 7.358 01/17/2017 1015   PCO2ART 29.2 (L) 01/17/2017 1015   PO2ART 145 (H) 01/17/2017 1015   HCO3 16.0 (L) 01/17/2017 1015   TCO2 26 01/07/2017 1100   TCO2 25 01/07/2017 1100   ACIDBASEDEF 8.4 (H) 01/17/2017 1015   O2SAT 99.3 01/17/2017 1015     CBG (last 3)  No results for input(s): GLUCAP in the last 72  hours.   Imaging: Dg Chest Port 1 View  Result Date: 01/17/2017 CLINICAL DATA:  Shortness of breath. EXAM: PORTABLE CHEST 1 VIEW COMPARISON:  Single-view of the chest 01/15/2017 and 01/10/2017. FINDINGS: There is marked cardiomegaly but the lungs are clear without edema. No pneumothorax or pleural effusion. Left IJ catheter and right IJ dialysis catheter are unchanged. Atherosclerosis is noted. IMPRESSION: Cardiomegaly without acute disease. Atherosclerosis. Electronically Signed   By: Inge Rise M.D.   On: 01/17/2017 10:22     Studies:  Antibiotics: Vancomycin 4/01 >> 4/03 Daptomycin 4/03 >> 4/05 Ampicillin 4/05 >> Rocephin 4/05 >>  Cultures: Blood 4/01 >> E faecalis, vancomycin resistant  Lines/tubes: Rt IJ HD cath 3/31 >> Lt IJ CVL 4/02 >>  Events: 3/18 Admit 3/21 start CRRT 3/27 DC cardioversion 3/29 To SDU 3/31 Fever 4/06 Failed repeat DC cardioversion 4/08 Transition off CRRT  Summary: 61 yo female admitted with CHF exacerbation complicated by renal failure and VRE bacteremia.   Assessment/plan:  Acute on chronic combined CHF. A fib. HTN. Hypotension. - pressors to keep MAP > 65 - even fluid balance - continue midodrine -  amiodarone, coumadin  Acute on chronic hypoxic/hypercapnic respiratory failure. COPD. OSA/OHS. - O2 to keep SpO2 90 to 95% - CPAP qhs  Rhinitis with CPAP. - flonase  Acute renal failure with cardiorenal syndrome. CKD 4. - renal following for HD needs  VRE bacteremia. - continue abx until 01/26/17 per ID recommendations from 01/13/17 - repeat blood cultures 2 weeks after Abx d/c'ed  Deconditioning. - PT recommending CIR  DVT prophylaxis - coumadin SUP - not indicated Nutrition - heart health diet Goals of care - Full code  Time spent 35 minutes  Chesley Mires, MD [place] 01/18/2017, 1:09 PM Pager:  [phone] After 3pm call: [phone]

## 2017-01-18 NOTE — Progress Notes (Signed)
Advanced Heart Failure Rounding Note  PCP: Kerin Perna, NP  Primary Cardiologist: Dr. Gwenlyn Found   Subjective:    Failed DC-CV. 4/5   Remains in AF 100-120. On amio and heparin gtt.   CVVHD stopped 01/16/17. CVP 6 this am.  Back on norepi at 6 mcg/hr. Pressures in 80s. Asymptomatic.   Feeling good overall. Complaining of nasal/sinus congestion. Ear feels stopped up. Denies SOB, lightheadedness, or dizziness.   Bed weight shows down another 10 lbs.  ? Accuracy. Down 130 + lbs this admission.      Objective:   Weight Range: 265 lb (120.2 kg) Body mass index is 40.89 kg/m.   Vital Signs:   Temp:  [97.4 F (36.3 C)-98.5 F (36.9 C)] 97.7 F (36.5 C) (04/10 0808) Pulse Rate:  [87-136] 104 (04/10 0700) Resp:  [0-28] 15 (04/10 0700) BP: (56-166)/(42-143) 85/61 (04/10 0700) SpO2:  [82 %-100 %] 82 % (04/10 0700) Weight:  [265 lb (120.2 kg)] 265 lb (120.2 kg) (04/10 0600) Last BM Date: 01/17/17  Weight change: Filed Weights   01/16/17 0500 01/17/17 0500 01/18/17 0600  Weight: 276 lb (125.2 kg) 275 lb (124.7 kg) 265 lb (120.2 kg)    Intake/Output:   Intake/Output Summary (Last 24 hours) at 01/18/17 0839 Last data filed at 01/18/17 0600  Gross per 24 hour  Intake          1273.95 ml  Output              468 ml  Net           805.95 ml     Physical Exam: General: AA female in NAD. Seated in recliner.  HEENT: Normal Neck: Supple, R chest tunneled cat, LIJ TLC. JVP 6-7 cm.  Cor: IRR IRR and somewhat tachy. PMI non-palpable.  Lungs: CTAB, normal effort.  Abdomen: Obese, soft, NT, ND, no HSM. No bruits or masses. +BS.  Extremities: No cyanosis, clubbing, rash, or edema.  Warm Neuro: Alert & oriented x 3. Cranial nerves grossly intact. Moves all 4 extremities w/o difficulty. Affect pleasant     Telemetry: Personally reviewed, Afib 100-120   Labs: CBC  Recent Labs  01/17/17 1550 01/18/17 0333  WBC 8.2 10.2  HGB 10.1* 10.3*  HCT 34.8* 35.3*  MCV 92.1 91.5    PLT 252 875   Basic Metabolic Panel  Recent Labs  01/17/17 0356 01/17/17 1550 01/18/17 0333  NA 136 139 135  K 4.1 4.4 4.1  CL 99* 97* 96*  CO2 26 25 24   GLUCOSE 90 101* 92  BUN 6 8 9   CREATININE 1.65* 2.11* 2.90*  CALCIUM 9.4 9.9 10.1  MG 2.6*  --  2.6*  PHOS 2.6 3.1 3.7    BNP: BNP (last 3 results)  Recent Labs  09/18/16 1126 11/07/16 1633 12/26/16 1643  BNP 991.0* 490.4* 1,116.0*    Medications:     Scheduled Medications: . ampicillin (OMNIPEN) IV  2 g Intravenous Q12H  . bisacodyl  10 mg Oral Daily  . cefTRIAXone (ROCEPHIN)  IV  2 g Intravenous Q12H  . Chlorhexidine Gluconate Cloth  6 each Topical Daily  . darbepoetin (ARANESP) injection - NON-DIALYSIS  150 mcg Subcutaneous Q Tue-1800  . fluticasone  1 spray Each Nare BID  . mouth rinse  15 mL Mouth Rinse BID  . midodrine  10 mg Oral TID WC  . multivitamin  1 tablet Oral QHS  . senna-docusate  2 tablet Oral QHS  . sodium chloride flush  10-40 mL Intracatheter Q12H    Infusions: . amiodarone 30 mg/hr (01/18/17 0600)  . heparin 2,500 Units/hr (01/18/17 0600)  . norepinephrine (LEVOPHED) Adult infusion 5.973 mcg/min (01/18/17 0600)  . dialysis replacement fluid (prismasate) 300 mL/hr at 01/16/17 1952  . dialysis replacement fluid (prismasate) 300 mL/hr at 01/16/17 1952  . dialysate (PRISMASATE) 1,500 mL/hr at 01/17/17 0828    PRN Medications: acetaminophen, ALPRAZolam, diphenhydrAMINE-zinc acetate, heparin, heparin, ondansetron (ZOFRAN) IV, oxyCODONE, polyethylene glycol, promethazine, sodium chloride, sodium chloride flush, sorbitol   Assessment/Plan   1. Acute on chronic systolic/diastolic CHF - Down > 625 lbs with CVVHD.  CVP ~ 6 and pressures soft.  - Now back on norepi with pressure drops overnight. Resting from all HD today.  - EF 25-30% on TEE 4/5 (down from 55% in 3/18) -> suspect tachy related from AF - No BB, ACE with low BP and ARF 2. Paroxysmal atrial fibrillation on Coumadin     -s/p DC-CV on 3/27    - failed DC-CV on 4/5    - Remains on heparin drip. Will start coumadin this evening.     - Not candidate for AVN ablation and CRT-D with need for HD and recent bacteremia - Pt has been on IV amiodarone since 12/27/16.  Has received over 16g.  Transition to po at 400 mg BID.  3. Acute on chronic kidney disease stage IV    - CVVHD on hold.  Pressures soft so holding off on all HD today.     - Continue midodrine 10 TID WC.  4. Obesity hypoventilation syndrome.  5. Hyperkalemia    - Resolved. Follow with diuresis/dialysis.  6. PAH with acute RV failure/cor pulmonale    - Combination WHO GROUP II & III 7. Acute on chronic respiratory failure - Much improved with significant diuresis/dialysis.  8. Anemia - Chronic component. Stable. No overt bleeding.  9. NSVT    - Quiescent. Continue to follow electrolytes, K 4.1 today,  10. Septic Shock- Blood CX--> VRE - Repeat BCx negative for 5 days. ID following.   - TEE 01/13/17 no endocarditis   Failed DC-CV last week.  Case has been discussed with EP and recommend continuing amio. Will transition to po amio at 200 mg BID with > 16 g load so far. Not candidate for AVN ablation/CRT with need for HD and recent enterococcus bacteremia. Will continue heparin for now. Starting coumadin. Dosing per pharm.    Shirley Friar, PA-C  8:39 AM   Length of Stay: 16 Advanced Heart Failure Team  Pager [phone] M-F 7am-4pm.  Please contact [facility] for night-coverage after hours (4p -7a ) and weekends on [email or web address]  Patient seen with PA, agree with the above note.  She remains in atrial fibrillation in the 110s.  She is on low dose norepinephrine with soft BP, suspect we overshot fluid removal.  No HD planned for today.   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  She is now on amiodarone 400 mg bid.  Would continue this for a few more days for further load and try once more to  cardiovert (once off norepinephrine).  If no success, only option would be nodal ablation + BiV pacing.  Will likely need to wait several more weeks if this is needed to get more distance out from recent VRE bacteremia (will ask EP about timing).   Loralie Champagne 01/18/2017

## 2017-01-18 NOTE — Progress Notes (Signed)
Assessment/Plan: 1 AKI/CKD4, ? ESRD. Oliguric. Down 60 kg.  2 Anemia ESA, FE 3 HPTH vit D 4 DM controlled 5 CM still primary issue 6 Afib on Amio  7 Massive obesity 8 VRE sepsis on Dapto] P No indication for HD today.  I suspect low BP due to low intravascular volume  Subjective: Interval History: No dyspnea today  Objective: Vital signs in last 24 hours: Temp:  [97.4 F (36.3 C)-98.5 F (36.9 C)] 98.5 F (36.9 C) (04/10 0400) Pulse Rate:  [87-136] 104 (04/10 0700) Resp:  [0-28] 15 (04/10 0700) BP: (48-166)/(30-143) 85/61 (04/10 0700) SpO2:  [82 %-100 %] 82 % (04/10 0700) Weight:  [120.2 kg (265 lb)] 120.2 kg (265 lb) (04/10 0600) Weight change: -4.536 kg (-10 lb)  Intake/Output from previous day: 04/09 0701 - 04/10 0700 In: 1365.7 [P.O.:390; I.V.:825.7; IV Piggyback:150] Out: 611  Intake/Output this shift: No intake/output data recorded.  General appearance: alert, cooperative and appears comfortable GI: soft, non-tender; bowel sounds normal; no masses,  no organomegaly Extremities: edema minimal   Lab Results:  Recent Labs  01/17/17 1550 01/18/17 0333  WBC 8.2 10.2  HGB 10.1* 10.3*  HCT 34.8* 35.3*  PLT 252 249   BMET:  Recent Labs  01/17/17 1550 01/18/17 0333  NA 139 135  K 4.4 4.1  CL 97* 96*  CO2 25 24  GLUCOSE 101* 92  BUN 8 9  CREATININE 2.11* 2.90*  CALCIUM 9.9 10.1   No results for input(s): PTH in the last 72 hours. Iron Studies: No results for input(s): IRON, TIBC, TRANSFERRIN, FERRITIN in the last 72 hours. Studies/Results: Dg Chest Port 1 View  Result Date: 01/17/2017 CLINICAL DATA:  Shortness of breath. EXAM: PORTABLE CHEST 1 VIEW COMPARISON:  Single-view of the chest 01/15/2017 and 01/10/2017. FINDINGS: There is marked cardiomegaly but the lungs are clear without edema. No pneumothorax or pleural effusion. Left IJ catheter and right IJ dialysis catheter are unchanged. Atherosclerosis is noted. IMPRESSION: Cardiomegaly without acute  disease. Atherosclerosis. Electronically Signed   By: Inge Rise M.D.   On: 01/17/2017 10:22    Scheduled: . ampicillin (OMNIPEN) IV  2 g Intravenous Q12H  . bisacodyl  10 mg Oral Daily  . cefTRIAXone (ROCEPHIN)  IV  2 g Intravenous Q12H  . Chlorhexidine Gluconate Cloth  6 each Topical Daily  . darbepoetin (ARANESP) injection - NON-DIALYSIS  150 mcg Subcutaneous Q Tue-1800  . fluticasone  1 spray Each Nare BID  . mouth rinse  15 mL Mouth Rinse BID  . midodrine  10 mg Oral TID WC  . multivitamin  1 tablet Oral QHS  . senna-docusate  2 tablet Oral QHS  . sodium chloride flush  10-40 mL Intracatheter Q12H    LOS: 23 days   Aristide Waggle C 01/18/2017,7:48 AM

## 2017-01-18 NOTE — Progress Notes (Signed)
Patient refused CPAP for tonight. RT advised patient to have RT called if she changes her mind. RT will monitor as needed.

## 2017-01-19 ENCOUNTER — Other Ambulatory Visit: Payer: Self-pay | Admitting: Internal Medicine

## 2017-01-19 LAB — CBC
HEMATOCRIT: 31.8 % — AB (ref 36.0–46.0)
Hemoglobin: 9.3 g/dL — ABNORMAL LOW (ref 12.0–15.0)
MCH: 26.7 pg (ref 26.0–34.0)
MCHC: 29.2 g/dL — AB (ref 30.0–36.0)
MCV: 91.4 fL (ref 78.0–100.0)
PLATELETS: 210 10*3/uL (ref 150–400)
RBC: 3.48 MIL/uL — ABNORMAL LOW (ref 3.87–5.11)
RDW: 24.9 % — AB (ref 11.5–15.5)
WBC: 8 10*3/uL (ref 4.0–10.5)

## 2017-01-19 LAB — RENAL FUNCTION PANEL
ALBUMIN: 2.8 g/dL — AB (ref 3.5–5.0)
ANION GAP: 18 — AB (ref 5–15)
Albumin: 3 g/dL — ABNORMAL LOW (ref 3.5–5.0)
Anion gap: 18 — ABNORMAL HIGH (ref 5–15)
BUN: 20 mg/dL (ref 6–20)
BUN: 24 mg/dL — ABNORMAL HIGH (ref 6–20)
CALCIUM: 10.4 mg/dL — AB (ref 8.9–10.3)
CHLORIDE: 95 mmol/L — AB (ref 101–111)
CO2: 22 mmol/L (ref 22–32)
CO2: 20 mmol/L — AB (ref 22–32)
Calcium: 10.6 mg/dL — ABNORMAL HIGH (ref 8.9–10.3)
Chloride: 94 mmol/L — ABNORMAL LOW (ref 101–111)
Creatinine, Ser: 4.24 mg/dL — ABNORMAL HIGH (ref 0.44–1.00)
Creatinine, Ser: 4.99 mg/dL — ABNORMAL HIGH (ref 0.44–1.00)
GFR calc non Af Amer: 11 mL/min — ABNORMAL LOW (ref >60)
GFR calc non Af Amer: 9 mL/min — ABNORMAL LOW (ref >60)
GFR, EST AFRICAN AMERICAN: 12 mL/min — AB (ref >60)
GFR, EST AFRICAN AMERICAN: 10 mL/min — AB (ref >60)
GLUCOSE: 97 mg/dL (ref 65–99)
Glucose, Bld: 88 mg/dL (ref 65–99)
PHOSPHORUS: 4.2 mg/dL (ref 2.5–4.6)
POTASSIUM: 4.3 mmol/L (ref 3.5–5.1)
Phosphorus: 5.5 mg/dL — ABNORMAL HIGH (ref 2.5–4.6)
Potassium: 4.7 mmol/L (ref 3.5–5.1)
SODIUM: 134 mmol/L — AB (ref 135–145)
SODIUM: 133 mmol/L — AB (ref 135–145)

## 2017-01-19 LAB — PROTIME-INR
INR: 1.48
Prothrombin Time: 18.1 seconds — ABNORMAL HIGH (ref 11.4–15.2)

## 2017-01-19 LAB — MAGNESIUM: MAGNESIUM: 2.5 mg/dL — AB (ref 1.7–2.4)

## 2017-01-19 LAB — HEPARIN LEVEL (UNFRACTIONATED): Heparin Unfractionated: 0.62 IU/mL (ref 0.30–0.70)

## 2017-01-19 MED ORDER — WARFARIN SODIUM 7.5 MG PO TABS
7.5000 mg | ORAL_TABLET | Freq: Once | ORAL | Status: AC
  Administered 2017-01-19: 7.5 mg via ORAL
  Filled 2017-01-19: qty 1

## 2017-01-19 MED ORDER — HEPARIN SODIUM (PORCINE) 1000 UNIT/ML DIALYSIS
1000.0000 [IU] | INTRAMUSCULAR | Status: DC | PRN
  Filled 2017-01-19: qty 6

## 2017-01-19 NOTE — Progress Notes (Signed)
Pt refuses CPAP tonight because her ear is stopped up.

## 2017-01-19 NOTE — Progress Notes (Signed)
   Floor RN to call Renal physician to clarify about PICC line order.

## 2017-01-19 NOTE — Progress Notes (Signed)
Assessment/Plan: 1 AKI/CKD4,? ESRD. Oliguric. .  2 Anemia ESA, FE 3 HPTH vit D 4 DM controlled 5 CM still primary issue 6 Afib on Amio  7 Massive obesity 8 VRE sepsis on Amp P No urgent indication for HD today.    Subjective: Interval History: Feels better  Objective: Vital signs in last 24 hours: Temp:  [97.6 F (36.4 C)-97.9 F (36.6 C)] 97.9 F (36.6 C) (04/11 1100) Pulse Rate:  [84-126] 101 (04/11 1230) Resp:  [12-24] 14 (04/11 1302) BP: (77-138)/(36-97) 81/69 (04/11 1302) SpO2:  [89 %-100 %] 100 % (04/11 1230) Weight:  [131.5 kg (290 lb)] 131.5 kg (290 lb) (04/11 0500) Weight change: 11.3 kg (25 lb)  Intake/Output from previous day: 04/10 0701 - 04/11 0700 In: 1126.2 [P.O.:185; I.V.:691.2; IV Piggyback:250] Out: -  Intake/Output this shift: Total I/O In: 147 [P.O.:75; I.V.:72] Out: -   General appearance: alert and cooperative Resp: clear to auscultation bilaterally Chest wall: no tenderness Cardio: irregularly irregular rhythm Extremities: chronic changes  Lab Results:  Recent Labs  01/18/17 0333 01/19/17 0547  WBC 10.2 8.0  HGB 10.3* 9.3*  HCT 35.3* 31.8*  PLT 249 210   BMET:  Recent Labs  01/18/17 1544 01/19/17 0547  NA 135 134*  K 4.3 4.3  CL 95* 94*  CO2 24 22  GLUCOSE 114* 88  BUN 16 20  CREATININE 3.66* 4.24*  CALCIUM 10.1 10.4*   No results for input(s): PTH in the last 72 hours. Iron Studies: No results for input(s): IRON, TIBC, TRANSFERRIN, FERRITIN in the last 72 hours. Studies/Results: No results found.  Scheduled: . amiodarone  400 mg Oral BID  . ampicillin (OMNIPEN) IV  2 g Intravenous Q12H  . bisacodyl  10 mg Oral Daily  . cefTRIAXone (ROCEPHIN)  IV  2 g Intravenous Q12H  . Chlorhexidine Gluconate Cloth  6 each Topical Daily  . darbepoetin (ARANESP) injection - NON-DIALYSIS  150 mcg Subcutaneous Q Tue-1800  . fluticasone  1 spray Each Nare BID  . mouth rinse  15 mL Mouth Rinse BID  . midodrine  10 mg Oral TID WC   . multivitamin  1 tablet Oral QHS  . senna-docusate  2 tablet Oral QHS  . sodium chloride flush  10-40 mL Intracatheter Q12H  . warfarin  7.5 mg Oral ONCE-1800  . Warfarin - Pharmacist Dosing Inpatient   Does not apply q1800    LOS: 24 days   Ramiro Pangilinan C 01/19/2017,1:40 PM

## 2017-01-19 NOTE — Progress Notes (Signed)
Advanced Heart Failure Rounding Note  PCP: Kerin Perna, NP  Primary Cardiologist: Dr. Gwenlyn Found   Subjective:    s/p failed DCCV 3/28 and 4/5. Remains in Afib/AFL in 110s. Amio switched to po 01/18/17 after > 16 g load of IV.     INR 1.48. Remains on heparin while INR trending up.   CVVHD stopped 01/16/17. CVP 7-8. Creatinine 4.24 up from 3.66 yesterday. BUN trending up slightly.   Feeling good today. Denies SOB, lightheadedness, dizziness, or palpitations. Getting more anxious to go home.   Bed weight shows UP 25 lbs.  Inaccurate.  Output not being followed well.    Objective:   Weight Range: 290 lb (131.5 kg) Body mass index is 44.75 kg/m.   Vital Signs:   Temp:  [97.6 F (36.4 C)-97.9 F (36.6 C)] 97.8 F (36.6 C) (04/10 2300) Pulse Rate:  [84-126] 94 (04/11 0600) Resp:  [12-24] 15 (04/11 0600) BP: (75-138)/(36-97) 79/53 (04/11 0600) SpO2:  [89 %-99 %] 96 % (04/11 0600) Weight:  [290 lb (131.5 kg)] 290 lb (131.5 kg) (04/11 0500) Last BM Date: 01/18/17  Weight change: Filed Weights   01/17/17 0500 01/18/17 0600 01/19/17 0500  Weight: 275 lb (124.7 kg) 265 lb (120.2 kg) 290 lb (131.5 kg)    Intake/Output:   Intake/Output Summary (Last 24 hours) at 01/19/17 0904 Last data filed at 01/19/17 0600  Gross per 24 hour  Intake           984.32 ml  Output                0 ml  Net           984.32 ml     Physical Exam: General: AA female in NAD.   HEENT: Normal Neck: Supple, thick. R chest tunneled cath, LIJ TLC. JVP 6-7 cm.   Cor: Irregularly and irregular. Somewhat tachy. PMI non-palpable.   Lungs: Clear, normal effort.   Abdomen: Obese, soft, NT, ND, no HSM. No bruits or masses. +BS  Extremities: No cyanosis, clubbing, rash or edema. Warm to the touch.  Neuro: Alert & oriented x 3. Cranial nerves grossly intact. Moves all 4 extremities w/o difficulty. Affect pleasant    Telemetry: Reviewed personally, Afib 100-110s    Labs: CBC  Recent Labs  01/18/17 0333 01/19/17 0547  WBC 10.2 8.0  HGB 10.3* 9.3*  HCT 35.3* 31.8*  MCV 91.5 91.4  PLT 249 185   Basic Metabolic Panel  Recent Labs  01/18/17 0333 01/18/17 1544 01/19/17 0547  NA 135 135 134*  K 4.1 4.3 4.3  CL 96* 95* 94*  CO2 24 24 22   GLUCOSE 92 114* 88  BUN 9 16 20   CREATININE 2.90* 3.66* 4.24*  CALCIUM 10.1 10.1 10.4*  MG 2.6*  --  2.5*  PHOS 3.7 3.9 4.2    BNP: BNP (last 3 results)  Recent Labs  09/18/16 1126 11/07/16 1633 12/26/16 1643  BNP 991.0* 490.4* 1,116.0*    Medications:     Scheduled Medications: . amiodarone  400 mg Oral BID  . ampicillin (OMNIPEN) IV  2 g Intravenous Q12H  . bisacodyl  10 mg Oral Daily  . cefTRIAXone (ROCEPHIN)  IV  2 g Intravenous Q12H  . Chlorhexidine Gluconate Cloth  6 each Topical Daily  . darbepoetin (ARANESP) injection - NON-DIALYSIS  150 mcg Subcutaneous Q Tue-1800  . fluticasone  1 spray Each Nare BID  . mouth rinse  15 mL Mouth Rinse BID  .  midodrine  10 mg Oral TID WC  . multivitamin  1 tablet Oral QHS  . senna-docusate  2 tablet Oral QHS  . sodium chloride flush  10-40 mL Intracatheter Q12H  . Warfarin - Pharmacist Dosing Inpatient   Does not apply q1800    Infusions: . heparin 2,400 Units/hr (01/19/17 0230)  . norepinephrine (LEVOPHED) Adult infusion Stopped (01/18/17 1739)    PRN Medications: acetaminophen, ALPRAZolam, diphenhydrAMINE-zinc acetate, heparin, ondansetron (ZOFRAN) IV, oxyCODONE, polyethylene glycol, promethazine, sodium chloride, sodium chloride flush, sorbitol   Assessment/Plan   1. Acute on chronic systolic/diastolic CHF - Down > 629 lbs with CVVHD.  Weights inaccurate this am. CVP 7-8   - Back off nor-epi.  - EF 25-30% on TEE 4/5 (down from 55% in 3/18) -> suspect tachy related from AF - No BB, ACE with low BP and ARF. No change.  2. Paroxysmal atrial fibrillation on Coumadin    -s/p DC-CV on 3/27 and failed again on 01/13/17.     - Awaiting EP input on further rhythm  control planning.     - Continue heparin drip for now while INR trending up.  INR 1.48 this am. Dosing per pharm.  - Continue po amio 400 mg BID for now.  3. Acute on chronic kidney disease stage IV    - CVVHD on hold for last 2 days with soft pressures. Back off norepi and CVP/Creatinine climbing.  Plan per renal.       - Continue midodrine 10 TID WC.  4. Obesity hypoventilation syndrome.  5. Hyperkalemia    - Stable. Continue to follow.  6. PAH with acute RV failure/cor pulmonale    - Combination WHO GROUP II & III. No change to current plan.  7. Acute on chronic respiratory failure - Resolved with marked diuresis and weight loss.  8. Anemia - Hgb 9.3 down from 10 overnight. No overt bleeding continue to follow.  9. NSVT    - Quiescent. Electrolytes stable.   10. Septic Shock- Blood CX--> VRE - Repeat BCx negative for 5 days. ID following.   - TEE 01/13/17 no endocarditis  - Much improved. Now off pressors.   Will discuss antiarrythmic plan further with EP. Dialysis timing per Renal. Will not add any afterload reducing medications with recent pressor requirement.   Shirley Friar, PA-C  9:04 AM   Length of Stay: 16 Advanced Heart Failure Team  Pager [phone] M-F 7am-4pm.  Please contact [facility] for night-coverage after hours (4p -7a ) and weekends on [email or web address]  Patient seen with PA, agree with the above note.   She is now off norepinephrine, BP stable on midodrine.    Creatinine rising, she remains anuric.  Suspect she will need HD, timing per renal service.  CVP stable at 7-8.   Antibiotics to 4/18 with VRE bacteremia.   HR remains in 110s, atrial fibrillation. 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 She is now on amiodarone 400 mg bid.  She is now off norepinephrine and has had a large amiodarone load.  Will discuss with EP, but would favor repeat DCCV attempt tomorrow now that off pressor.  If no success,  only option would be nodal ablation + BiV pacing.  Will likely need to wait several more weeks if this is needed to get more distance out from recent VRE bacteremia (will ask EP about timing).   Loralie Champagne 01/19/2017 10:39 AM

## 2017-01-19 NOTE — Progress Notes (Signed)
PULMONARY / CRITICAL CARE MEDICINE   Name: Paige Marshall MRN: [id number] DOB: Aug 19, 1956    ADMISSION DATE:  12/26/2016 CONSULTATION DATE:  12/30/2016  REFERRING MD:  Dr. Tommy Medal, IMTS  CHIEF COMPLAINT:  Dyspnea   Brief:   61 yo female from rehab with dyspnea and edema from acute on chronic CHF exacerbation  SUBJECTIVE:  Sitting in chair, breathing better, denies pain   VITAL SIGNS: BP (!) 79/53   Pulse 94   Temp 97.8 F (36.6 C) (Axillary)   Resp 15   Ht 5' 7.5" (1.715 m)   Wt 131.5 kg (290 lb)   LMP 09/07/2011   SpO2 96%   BMI 44.75 kg/m   HEMODYNAMICS: CVP:  [6 mmHg-10 mmHg] 9 mmHg  VENTILATOR SETTINGS:    INTAKE / OUTPUT: I/O last 3 completed shifts: In: 1782.7 [P.O.:425; I.V.:1057.7; IV Piggyback:300] Out: -   PHYSICAL EXAMINATION: General:  Adult female, sitting in chair, no distress  Neuro:  Alert, oriented, moves all extremities  HEENT:  Normocephalic  Cardiovascular:  RRR, no MRG, NI S1/S2 Lungs:  Clear breath sounds, no wheeze/crackles  Abdomen:  Obese, active bowel sounds  Musculoskeletal:  No acute  Skin:  Warm, dry, intact   LABS:  BMET  Recent Labs Lab 01/18/17 0333 01/18/17 1544 01/19/17 0547  NA 135 135 134*  K 4.1 4.3 4.3  CL 96* 95* 94*  CO2 24 24 22   BUN 9 16 20   CREATININE 2.90* 3.66* 4.24*  GLUCOSE 92 114* 88    Electrolytes  Recent Labs Lab 01/17/17 0356  01/18/17 0333 01/18/17 1544 01/19/17 0547  CALCIUM 9.4  < > 10.1 10.1 10.4*  MG 2.6*  --  2.6*  --  2.5*  PHOS 2.6  < > 3.7 3.9 4.2  < > = values in this interval not displayed.  CBC  Recent Labs Lab 01/17/17 1550 01/18/17 0333 01/19/17 0547  WBC 8.2 10.2 8.0  HGB 10.1* 10.3* 9.3*  HCT 34.8* 35.3* 31.8*  PLT 252 249 210    Coag's  Recent Labs Lab 01/18/17 0927 01/19/17 0547  INR 1.33 1.48    Sepsis Markers No results for input(s): LATICACIDVEN, PROCALCITON, O2SATVEN in the last 168 hours.  ABG  Recent Labs Lab 01/17/17 1015   PHART 7.358  PCO2ART 29.2*  PO2ART 145*    Liver Enzymes  Recent Labs Lab 01/18/17 0333 01/18/17 1544 01/19/17 0547  ALBUMIN 3.0* 2.8* 2.8*    Cardiac Enzymes No results for input(s): TROPONINI, PROBNP in the last 168 hours.  Glucose No results for input(s): GLUCAP in the last 168 hours.  Imaging No results found.   STUDIES:  CXR 4/1 > Cardiomegaly with mild persistent pulmonary edema similar to the previous exam CXR 4/9 > Cardiomegaly without acute disease   CULTURES: Blood 4/01 > E faecalis, vancomycin resistant  ANTIBIOTICS: Vancomycin 4/01 > 4/03 Daptomycin 4/03 > 4/05 Ampicillin 4/05 > Rocephin 4/05 >  SIGNIFICANT EVENTS: 3/18 Admit 3/21 start CRRT 3/27 DC cardioversion 3/29 To SDU 3/31 Fever 4/06 Failed repeat DC cardioversion 4/08 Transition off CRRT  LINES/TUBES: Rt IJ HD cath 3/31 >> Lt IJ CVL 4/02 >>  DISCUSSION: 61 yo female admitted with CHF exacerbation complicated by renal failure and VRE bacteremia  ASSESSMENT / PLAN:  PULMONARY A: Acute on Chronic hypoxic/hypercarbic respiratory failure  H/O COPD, OSA/OHS P:   -Maintain Oxygen Saturation >90 -CPAP at HS  -Pulmonary Hygiene   CARDIOVASCULAR A:  Acute on Chronic combined CHF Hypotension  H/O Afib,  HTN P:  -Heart Failure Following  -Maintain MAP >65 -Continue Midodrine  -Continue amiodarone, coumadin/heparin gtt    RENAL A:   Acute renal failure with cardiorenal syndrome  P:   -Per Nephrology  -Trend BMP -Strict I & O -Replace electrolytes as needed   GASTROINTESTINAL A:   No issues  P:   Continue heart healthy diet   HEMATOLOGIC A:   Anticoagulation needs  P:  -Heparin/Coumadin bridge per pharmacy  -Trend Coag Studies   INFECTIOUS A:   VRE Bacteremia  P:   -Continue Antibiotics until 4/18 per ID  -Per ID repeat blood cultures 2 weeks after stop date of antibiotics  -Trend WBC and fever curve  -Will speak to Nephrology/PICC team about PICC  placement to D/C CVC   ENDOCRINE A:   No issues    P:   Trend Glucose on BMP   NEUROLOGIC A:   Deconditioning  P:   RASS goal: 0 -Continue PT (Recommending CIR)   FAMILY  - Updates: no family at bedside    - Inter-disciplinary family meet or Palliative Care meeting due by:  Ongoing     Will transfer patient to step-down with internal medicine as primary   CC Time: 32 minutes   Hayden Pedro, [facility]  Pgr: [phone]  PCCM Pgr: [phone]   01/19/2017, 9:43 AM

## 2017-01-19 NOTE — Progress Notes (Signed)
Floor RN will follow up with renal MD in AM about picc line placement.

## 2017-01-19 NOTE — Progress Notes (Signed)
ANTICOAGULATION CONSULT NOTE - Follow Up Consult  Pharmacy Consult for heparin>>warfarin Indication: atrial fibrillation  Labs:  Recent Labs  01/17/17 0356  01/17/17 1550 01/18/17 0333 01/18/17 0927 01/18/17 1544 01/19/17 0547  HGB  --   < > 10.1* 10.3*  --   --  9.3*  HCT  --   --  34.8* 35.3*  --   --  31.8*  PLT  --   --  252 249  --   --  210  LABPROT  --   --   --   --  16.6*  --  18.1*  INR  --   --   --   --  1.33  --  1.48  HEPARINUNFRC 0.64  --   --  0.68  --   --  0.62  CREATININE 1.65*  --  2.11* 2.90*  --  3.66* 4.24*  < > = values in this interval not displayed.   Assessment: 61yo female now at goal on heparin after multiple rate adjustments; it has been difficult reaching and maintaining goal on this pt the past couple days.  Heparin drip 2400 units/hr HL 0.6 cbc stable. Warfarin resumed last night, INR trending up slowly to 1.4, will watch closely given robust amiodarone load.   Goal of Therapy:  INR goal 2-3 Heparin level 0.3-0.7 units/ml   Plan:  Continue heparin infusion at 2400 units/hr Daily INR, CBC and heparin level Warfarin 7.5mg  again tonight  Erin Hearing PharmD., BCPS Clinical Pharmacist Pager [phone] 01/19/2017 9:13 AM

## 2017-01-20 ENCOUNTER — Encounter (HOSPITAL_COMMUNITY)
Admission: EM | Disposition: A | Discharge status: Nursing Home (Includes ICF) | Payer: Self-pay | Source: Home / Self Care | Attending: Pulmonary Disease

## 2017-01-20 ENCOUNTER — Encounter (HOSPITAL_COMMUNITY): Payer: Self-pay | Admitting: Certified Registered Nurse Anesthetist

## 2017-01-20 ENCOUNTER — Inpatient Hospital Stay (HOSPITAL_COMMUNITY): Payer: Medicare Other

## 2017-01-20 ENCOUNTER — Inpatient Hospital Stay (HOSPITAL_COMMUNITY): Payer: Medicare Other | Admitting: Certified Registered Nurse Anesthetist

## 2017-01-20 DIAGNOSIS — R5381 Other malaise: Secondary | ICD-10-CM

## 2017-01-20 DIAGNOSIS — I619 Nontraumatic intracerebral hemorrhage, unspecified: Secondary | ICD-10-CM

## 2017-01-20 HISTORY — PX: CARDIOVERSION: SHX1299

## 2017-01-20 LAB — RENAL FUNCTION PANEL
ALBUMIN: 2.9 g/dL — AB (ref 3.5–5.0)
ANION GAP: 18 — AB (ref 5–15)
Albumin: 2.8 g/dL — ABNORMAL LOW (ref 3.5–5.0)
Anion gap: 17 — ABNORMAL HIGH (ref 5–15)
BUN: 27 mg/dL — ABNORMAL HIGH (ref 6–20)
BUN: 31 mg/dL — ABNORMAL HIGH (ref 6–20)
CALCIUM: 10 mg/dL (ref 8.9–10.3)
CHLORIDE: 96 mmol/L — AB (ref 101–111)
CO2: 20 mmol/L — AB (ref 22–32)
CO2: 19 mmol/L — ABNORMAL LOW (ref 22–32)
Calcium: 10.2 mg/dL (ref 8.9–10.3)
Chloride: 97 mmol/L — ABNORMAL LOW (ref 101–111)
Creatinine, Ser: 5.54 mg/dL — ABNORMAL HIGH (ref 0.44–1.00)
Creatinine, Ser: 6.13 mg/dL — ABNORMAL HIGH (ref 0.44–1.00)
GFR calc non Af Amer: 7 mL/min — ABNORMAL LOW (ref >60)
GFR, EST AFRICAN AMERICAN: 9 mL/min — AB (ref >60)
GFR, EST AFRICAN AMERICAN: 8 mL/min — AB (ref >60)
GFR, EST NON AFRICAN AMERICAN: 8 mL/min — AB (ref >60)
Glucose, Bld: 89 mg/dL (ref 65–99)
Glucose, Bld: 83 mg/dL (ref 65–99)
PHOSPHORUS: 5.9 mg/dL — AB (ref 2.5–4.6)
POTASSIUM: 5.2 mmol/L — AB (ref 3.5–5.1)
Phosphorus: 6 mg/dL — ABNORMAL HIGH (ref 2.5–4.6)
Potassium: 4.6 mmol/L (ref 3.5–5.1)
SODIUM: 134 mmol/L — AB (ref 135–145)
Sodium: 133 mmol/L — ABNORMAL LOW (ref 135–145)

## 2017-01-20 LAB — BLOOD GAS, ARTERIAL
Acid-base deficit: 6.1 mmol/L — ABNORMAL HIGH (ref 0.0–2.0)
Bicarbonate: 19.3 mmol/L — ABNORMAL LOW (ref 20.0–28.0)
DRAWN BY: 105521
O2 CONTENT: 2 L/min
O2 SAT: 92.3 %
PATIENT TEMPERATURE: 98.6
pCO2 arterial: 40.8 mmHg (ref 32.0–48.0)
pH, Arterial: 7.295 — ABNORMAL LOW (ref 7.350–7.450)
pO2, Arterial: 71.6 mmHg — ABNORMAL LOW (ref 83.0–108.0)

## 2017-01-20 LAB — PROTIME-INR
INR: 1.74
INR: 1.39
PROTHROMBIN TIME: 20.5 s — AB (ref 11.4–15.2)
Prothrombin Time: 17.1 seconds — ABNORMAL HIGH (ref 11.4–15.2)

## 2017-01-20 LAB — CBC
HCT: 30.4 % — ABNORMAL LOW (ref 36.0–46.0)
Hemoglobin: 9.1 g/dL — ABNORMAL LOW (ref 12.0–15.0)
MCH: 27 pg (ref 26.0–34.0)
MCHC: 29.9 g/dL — AB (ref 30.0–36.0)
MCV: 90.2 fL (ref 78.0–100.0)
PLATELETS: 177 10*3/uL (ref 150–400)
RBC: 3.37 MIL/uL — ABNORMAL LOW (ref 3.87–5.11)
RDW: 25.6 % — ABNORMAL HIGH (ref 11.5–15.5)
WBC: 8.3 10*3/uL (ref 4.0–10.5)

## 2017-01-20 LAB — HEPATIC FUNCTION PANEL
ALBUMIN: 2.8 g/dL — AB (ref 3.5–5.0)
ALT: 18 U/L (ref 14–54)
AST: 33 U/L (ref 15–41)
Alkaline Phosphatase: 74 U/L (ref 38–126)
BILIRUBIN TOTAL: 0.8 mg/dL (ref 0.3–1.2)
Bilirubin, Direct: 0.3 mg/dL (ref 0.1–0.5)
Indirect Bilirubin: 0.5 mg/dL (ref 0.3–0.9)
TOTAL PROTEIN: 6.1 g/dL — AB (ref 6.5–8.1)

## 2017-01-20 LAB — HEPARIN LEVEL (UNFRACTIONATED): Heparin Unfractionated: 0.35 IU/mL (ref 0.30–0.70)

## 2017-01-20 LAB — MAGNESIUM: Magnesium: 2.5 mg/dL — ABNORMAL HIGH (ref 1.7–2.4)

## 2017-01-20 LAB — AMMONIA: Ammonia: 40 umol/L — ABNORMAL HIGH (ref 9–35)

## 2017-01-20 SURGERY — CARDIOVERSION
Anesthesia: General

## 2017-01-20 MED ORDER — SODIUM CHLORIDE 0.9 % IV SOLN: 250.0000 mL | INTRAVENOUS | Status: DC

## 2017-01-20 MED ORDER — SODIUM CHLORIDE 0.9 % IV BOLUS (SEPSIS)
250.0000 mL | Freq: Once | INTRAVENOUS | Status: AC
  Administered 2017-01-20: 250 mL via INTRAVENOUS

## 2017-01-20 MED ORDER — WARFARIN SODIUM 7.5 MG PO TABS: 7.5000 mg | ORAL_TABLET | Freq: Once | ORAL | Status: DC

## 2017-01-20 MED ORDER — LIDOCAINE HCL (CARDIAC) 20 MG/ML IV SOLN
INTRAVENOUS | Status: DC | PRN
  Administered 2017-01-20: 60 mg via INTRATRACHEAL

## 2017-01-20 MED ORDER — PHENYLEPHRINE HCL 10 MG/ML IJ SOLN
INTRAMUSCULAR | Status: DC | PRN
  Administered 2017-01-20: 200 ug via INTRAVENOUS
  Administered 2017-01-20: 160 ug via INTRAVENOUS
  Administered 2017-01-20: 200 ug via INTRAVENOUS
  Administered 2017-01-20: 120 ug via INTRAVENOUS
  Administered 2017-01-20: 80 ug via INTRAVENOUS

## 2017-01-20 MED ORDER — NALOXONE HCL 0.4 MG/ML IJ SOLN
0.4000 mg | Freq: Once | INTRAMUSCULAR | Status: AC
  Administered 2017-01-20: 0.4 mg via INTRAVENOUS
  Filled 2017-01-20: qty 1

## 2017-01-20 MED ORDER — SODIUM CHLORIDE 0.9 % IV SOLN: Freq: Once | INTRAVENOUS | Status: DC

## 2017-01-20 MED ORDER — SODIUM CHLORIDE 0.9 % IV SOLN: INTRAVENOUS | Status: DC | PRN

## 2017-01-20 MED ORDER — AMIODARONE HCL IN DEXTROSE 360-4.14 MG/200ML-% IV SOLN
30.0000 mg/h | INTRAVENOUS | Status: DC
  Administered 2017-01-21 – 2017-01-31 (×24): 30 mg/h via INTRAVENOUS
  Filled 2017-01-20 (×24): qty 200

## 2017-01-20 MED ORDER — SENNOSIDES-DOCUSATE SODIUM 8.6-50 MG PO TABS: 2.0000 | ORAL_TABLET | Freq: Every evening | ORAL | Status: DC | PRN

## 2017-01-20 MED ORDER — DEXTROSE 5 % IV SOLN
5.0000 mg | Freq: Once | INTRAVENOUS | Status: AC
  Administered 2017-01-20: 5 mg via INTRAVENOUS
  Filled 2017-01-20: qty 0.5

## 2017-01-20 MED ORDER — EMPTY CONTAINERS FLEXIBLE MISC
2795.0000 [IU] | Status: AC
  Administered 2017-01-20: 2795 [IU] via INTRAVENOUS
  Filled 2017-01-20: qty 112

## 2017-01-20 MED ORDER — SODIUM CHLORIDE 0.9 % IV SOLN
INTRAVENOUS | Status: DC | PRN
  Administered 2017-01-20: 13:00:00 via INTRAVENOUS

## 2017-01-20 MED ORDER — PROPOFOL 10 MG/ML IV BOLUS
INTRAVENOUS | Status: DC | PRN
  Administered 2017-01-20: 30 mg via INTRAVENOUS
  Administered 2017-01-20: 50 mg via INTRAVENOUS

## 2017-01-20 MED ORDER — PROTHROMBIN COMPLEX CONC HUMAN 500 UNITS IV KIT
2500.0000 [IU] | PACK | Status: DC
  Filled 2017-01-20: qty 100

## 2017-01-20 MED ORDER — SODIUM CHLORIDE 0.9% FLUSH: 3.0000 mL | Freq: Two times a day (BID) | INTRAVENOUS | Status: DC

## 2017-01-20 MED ORDER — NOREPINEPHRINE BITARTRATE 1 MG/ML IV SOLN
0.0000 ug/min | INTRAVENOUS | Status: DC
  Filled 2017-01-20: qty 4

## 2017-01-20 MED ORDER — SODIUM CHLORIDE 0.9% FLUSH: 3.0000 mL | INTRAVENOUS | Status: DC | PRN

## 2017-01-20 MED ORDER — AMIODARONE HCL IN DEXTROSE 360-4.14 MG/200ML-% IV SOLN
60.0000 mg/h | INTRAVENOUS | Status: DC
  Administered 2017-01-21: 60 mg/h via INTRAVENOUS
  Filled 2017-01-20: qty 200

## 2017-01-20 NOTE — Anesthesia Preprocedure Evaluation (Signed)
Anesthesia Evaluation  Patient identified by MRN, date of birth, ID band Patient confused    Reviewed: Allergy & Precautions, NPO status , Patient's Chart, lab work & pertinent test results  History of Anesthesia Complications (+) PSEUDOCHOLINESTERASE DEFICIENCY  Airway Mallampati: II  TM Distance: >3 FB Neck ROM: Full    Dental no notable dental hx.    Pulmonary neg pulmonary ROS, asthma , COPD, former smoker,    breath sounds clear to auscultation + decreased breath sounds      Cardiovascular hypertension, + Peripheral Vascular Disease and +CHF  Normal cardiovascular exam+ dysrhythmias Atrial Fibrillation  Rhythm:Regular Rate:Normal     Neuro/Psych negative neurological ROS  negative psych ROS   GI/Hepatic negative GI ROS, Neg liver ROS,   Endo/Other  negative endocrine ROSMorbid obesity  Renal/GU Renal InsufficiencyRenal disease  negative genitourinary   Musculoskeletal negative musculoskeletal ROS (+)   Abdominal   Peds negative pediatric ROS (+)  Hematology negative hematology ROS (+) anemia ,   Anesthesia Other Findings   Reproductive/Obstetrics negative OB ROS                             Anesthesia Physical Anesthesia Plan  ASA: IV  Anesthesia Plan: General   Post-op Pain Management:    Induction: Intravenous  Airway Management Planned: Mask  Additional Equipment:   Intra-op Plan:   Post-operative Plan:   Informed Consent: I have reviewed the patients History and Physical, chart, labs and discussed the procedure including the risks, benefits and alternatives for the proposed anesthesia with the patient or authorized representative who has indicated his/her understanding and acceptance.   Dental advisory given  Plan Discussed with: CRNA and Surgeon  Anesthesia Plan Comments:         Anesthesia Quick Evaluation

## 2017-01-20 NOTE — Progress Notes (Signed)
Spoke with primary RN regarding PICC insertion.  Questioning having a PICC inserted since this is a renal pt.   RN to clarify with renal MD what they want to do.

## 2017-01-20 NOTE — Consult Note (Signed)
Physical Medicine and Rehabilitation Consult Reason for Consult: Debility due to multiple medical issues.  Referring Physician:  Dr. Evette Doffing   HPI: Paige Marshall is a 61 y.o. female with history of CKD, COPD, NICM, morbid obesity, OSA-untreated, RA, A fib, Tib fib fracture --at SNF since 08/2016 and multiple admission due to acute on chronic CHF. She was readmitted on 12/26/16 with dyspnea, fluid overload and acute renal failure with hyperkalemia. She was started on IV diuresis with worsening of renal status and CVVHD initiated to help manage acute on chronic renal failure due to cardiorenal syndrome. She went on to develop acute respiratory failure requiring BIPAP. Was placed on IV amiodarone and Cardizem  for management of A fib but has had issues with atypical A flutter with attempts at cardioversion 3/27 and 4/5.  She developed septic shock with fevers due to VRE bacteremia and TEE without evidence of endocarditis. She is to continue on IV ampicillin and Ceftriaxone for 12 weeks per ID. HD on hold and renal status being monitored but remains oliguric with question of ESRD. She continues to refuse CPAP and noted to confused.  Therapy ongoing with attempts at standing and CIR recommended by rehab team for follow up therapy.   Boyfriend in the room. He reports that their home is now handicapped accessible and he has multiple family members who will be able to physically assist after discharge? She was able to ambulate short distances with walker prior to fall--requires R-TKR due to "bad Knee".    Review of Systems  Unable to perform ROS: Mental acuity      Past Medical History:  Diagnosis Date  . Asthma   . Chronic combined systolic and diastolic CHF (congestive heart failure) ([place])   . CKD (chronic kidney disease), stage III   . COPD (chronic obstructive pulmonary disease) ([place])   . Degenerative joint disease of both lower legs   . Former smoker   . Gout    hx in "both knees"  (01/14/2015)  . H. pylori infection 01/03/2014   +breath test.    . High cholesterol   . Hypertension   . Morbid obesity ([place])   . NICM (nonischemic cardiomyopathy) ([place])    a. LHC 01/2015: normal cors, EF 45-50%. b. EF 50-55% in 08/2016. c. EF 33% 01/2017  . OSA (obstructive sleep apnea)   . Persistent atrial fibrillation ([place])   . Pneumonia X 2  . Pulmonary hypertension   . Rheumatoid arthritis ([place])   . Stroke [facility])     Past Surgical History:  Procedure Laterality Date  . ANGIOGRAM/LV (CONGENITAL)  2007  . BREATH TEK H PYLORI N/A 12/31/2013   Procedure: BREATH TEK H PYLORI;  Surgeon: Gayland Curry, MD;  Location: [facility];  Service: General;  Laterality: N/A;  . CARDIOVERSION N/A 01/13/2017   Procedure: CARDIOVERSION;  Surgeon: Jolaine Artist, MD;  Location: [place];  Service: Cardiovascular;  Laterality: N/A;  . CORNEAL TRANSPLANT Right ~ 2010  . CORONARY ANGIOPLASTY WITH STENT PLACEMENT  11/04/2005   "1"  . DOPPLER ECHOCARDIOGRAPHY     2 D   EF of 45%  . EYE SURGERY    . IR GENERIC HISTORICAL  01/06/2017   IR FLUORO GUIDE CV LINE RIGHT 01/06/2017 Corrie Mckusick, DO [facility]  . IR GENERIC HISTORICAL  01/06/2017   IR US GUIDE VASC ACCESS RIGHT 01/06/2017 Corrie Mckusick, DO [facility]  . LEFT HEART CATHETERIZATION WITH CORONARY ANGIOGRAM N/A 01/20/2015  Procedure: LEFT HEART CATHETERIZATION WITH CORONARY ANGIOGRAM;  Surgeon: Lorretta Harp, MD;  Location: [facility] CATH LAB;  Service: Cardiovascular;  Laterality: N/A;  . RIGHT HEART CATH N/A 01/07/2017   Procedure: Right Heart Cath;  Surgeon: Jolaine Artist, MD;  Location: [place] CV LAB;  Service: Cardiovascular;  Laterality: N/A;  . sleep study  2011  . stress myocardial dipyridamole perfusion    . TEE WITHOUT CARDIOVERSION N/A 01/13/2017   Procedure: TRANSESOPHAGEAL ECHOCARDIOGRAM (TEE);  Surgeon: Jolaine Artist, MD;  Location: [place];  Service: Cardiovascular;  Laterality: N/A;  . [place]  .  venous duplex ultrasound  2013    Family History  Problem Relation Age of Onset  . Heart disease Mother   . Cancer Father     colon  . Hypertension Other     Social History:  Lived with boyfriend prior to fall. Sedentary but mobile with walker. He manages home. Per reports that she quit smoking about 8 years ago. Her smoking use included Cigarettes. She has a 11.55 pack-year smoking history. She has never used smokeless tobacco. Per reports that she does not drink alcohol or use drugs.   Allergies  Allergen Reactions  . Penicillins Hives, Itching, Swelling and Other (See Comments)    Tongue swelling Has patient had a PCN reaction causing immediate rash, facial/tongue/throat swelling, SOB or lightheadedness with hypotension: Yes  Clarified with the patient her penicillin allergy. Pt has tolerated cefepime and ampicillin.    . Citrus Hives    Medications Prior to Admission  Medication Sig Dispense Refill  . ARTIFICIAL TEAR OP Apply 1 drop to eye every 6 (six) hours as needed (dry eyes).    . carvedilol (COREG) 6.25 MG tablet Take 1 tablet (6.25 mg total) by mouth 2 (two) times daily with a meal.    . diclofenac sodium (VOLTAREN) 1 % GEL Apply 4 g topically every 6 (six) hours as needed (pain).     [name] diltiazem (DILACOR XR) 180 MG 24 hr capsule Take 180 mg by mouth daily.    [name] docusate sodium (COLACE) 100 MG capsule Take 1 capsule (100 mg total) by mouth 2 (two) times daily. 10 capsule 0  . ferrous sulfate 325 (65 FE) MG tablet Take 1 tablet (325 mg total) by mouth 3 (three) times daily with meals.  3  . furosemide (LASIX) 40 MG tablet Take 1 tablet (40 mg total) by mouth 2 (two) times daily. (Patient taking differently: Take 80 mg by mouth 2 (two) times daily. ) 60 tablet 0  . metolazone (ZAROXOLYN) 2.5 MG tablet Take 1 tablet (2.5 mg total) by mouth daily. 90 tablet 1  . Multiple Vitamin (MULTIVITAMIN WITH MINERALS) TABS tablet Take 1 tablet by mouth daily.    [name] omega-3 acid ethyl  esters (LOVAZA) 1 g capsule Take 1 g by mouth 2 (two) times daily.    [name] oxyCODONE (OXY IR/ROXICODONE) 5 MG immediate release tablet Take 1 tablet (5 mg total) by mouth every 6 (six) hours as needed for breakthrough pain. 15 tablet 0  . polyethylene glycol (MIRALAX / GLYCOLAX) packet Take 17 g by mouth daily as needed. (Patient taking differently: Take 17 g by mouth daily as needed (constipation). Mix in 8 oz liquid and drink) 14 each 0  . potassium chloride SA (K-DUR,KLOR-CON) 20 MEQ tablet Take 40 mEq by mouth 3 (three) times daily.     . predniSONE (DELTASONE) 10 MG tablet Take 40 mg tablet 11/14/2016 and taper  down by 10 mg daily until completed 15 tablet 0  . PROAIR HFA 108 (90 Base) MCG/ACT inhaler INHALE 2 PUFFS EVERY 6 HOURS AS NEEDED FOR WHEEZING OR SHORTNESS OF BREATH. 8.5 Inhaler 1  . promethazine (PHENERGAN) 25 MG tablet Take 25 mg by mouth every 6 (six) hours as needed for nausea or vomiting.    . propafenone (RYTHMOL) 150 MG tablet Take 1 tablet (150 mg total) by mouth every 8 (eight) hours. 10 tablet 0  . senna-docusate (SENNA S) 8.6-50 MG tablet Take 2 tablets by mouth See admin instructions. Take 2 tablet by mouth daily at bedtime, may also take 2 tablets as needed for constipation    . warfarin (COUMADIN) 5 MG tablet Take 5 mg by mouth daily.    [name] zolpidem (AMBIEN) 5 MG tablet Take 5 mg by mouth at bedtime as needed for sleep.    . bisacodyl (DULCOLAX) 5 MG EC tablet Take 1 tablet (5 mg total) by mouth daily as needed for moderate constipation. (Patient not taking: Reported on 12/26/2016) 30 tablet 0  . PRESCRIPTION MEDICATION Inhale into the lungs at bedtime. BIPAP      Home: Home Living Family/patient expects to be discharged to:: Skilled nursing facility Living Arrangements: Spouse/significant other  Functional History: Prior Function Level of Independence: Needs assistance Gait / Transfers Assistance Needed: hoyer lift from bed <> w/c. In PT has assistance to stand in parallel  bars for 2-3 minutes, hasn't walked since fibula fx in Nov 2017, prior to that she walked with a RW and used O2 at night.  ADL's / Homemaking Assistance Needed: assist with ADL at bed level Functional Status:  Mobility: Bed Mobility Overal bed mobility: Needs Assistance Bed Mobility: Rolling Rolling: Min assist General bed mobility comments: Assist bringing leg over Transfers Overall transfer level: Needs assistance Equipment used: Rolling walker (2 wheeled) Transfer via Lift Equipment: [place] Transfers: Sit to/from Stand Sit to Stand: +2 physical assistance, Max assist (3rd person to stabilize walker) General transfer comment: Assist to bring hips and trunk up. On first attempt with 2 people pt only able to partially stand. With 3rd person stabilizing walker and blocking rt foot from sliding pt able to fully stand 2 more times. Allowed pt to place hands on walker handles to facilitate anterior wt shift.  Ambulation/Gait General Gait Details: unable    ADL:    Cognition: Cognition Overall Cognitive Status: Within Functional Limits for tasks assessed Orientation Level: Oriented X4 Cognition Arousal/Alertness: Awake/alert Behavior During Therapy: WFL for tasks assessed/performed Overall Cognitive Status: Within Functional Limits for tasks assessed   Blood pressure (!) 103/59, pulse 93, temperature 98.3 F (36.8 C), temperature source Oral, resp. rate 16, height 5' 7.5" (1.715 m), weight 131 kg (288 lb 12.8 oz), last menstrual period 09/07/2011, SpO2 90 %. Physical Exam  Nursing note and vitals reviewed. Constitutional: She appears well-developed and well-nourished. No distress.  Morbidly obese female sitting up in bed. NAD  HENT:  Head: Normocephalic and atraumatic.  Eyes: Conjunctivae are normal. Pupils are equal, round, and reactive to light.  Neck: Normal range of motion. Neck supple.  Cardiovascular: Normal rate.  An irregularly irregular rhythm present.  Respiratory:  No stridor. No respiratory distress. She has no wheezes.  GI: Bowel sounds are normal. She exhibits no distension. There is no tenderness.  Musculoskeletal: She exhibits edema.  Neurological: She is alert.  Disoriented and slow to process. Distracted with perseverative behaviors. She needed multiple cues to answer simple orientation questions and follow  simple motor commands.   Skin: Skin is warm and dry. She is not diaphoretic.  Psychiatric: Her affect is inappropriate. Her speech is delayed and tangential. She is slowed. Cognition and memory are impaired. She is inattentive.    Results for orders placed or performed during the hospital encounter of 12/26/16 (from the past 24 hour(s))  Renal function panel (daily at 1600)     Status: Abnormal   Collection Time: 01/19/17  3:55 PM  Result Value Ref Range   Sodium 133 (L) 135 - 145 mmol/L   Potassium 4.7 3.5 - 5.1 mmol/L   Chloride 95 (L) 101 - 111 mmol/L   CO2 20 (L) 22 - 32 mmol/L   Glucose, Bld 97 65 - 99 mg/dL   BUN 24 (H) 6 - 20 mg/dL   Creatinine, Ser 4.99 (H) 0.44 - 1.00 mg/dL   Calcium 10.6 (H) 8.9 - 10.3 mg/dL   Phosphorus 5.5 (H) 2.5 - 4.6 mg/dL   Albumin 3.0 (L) 3.5 - 5.0 g/dL   GFR calc non Af Amer 9 (L) >60 mL/min   GFR calc Af Amer 10 (L) >60 mL/min   Anion gap 18 (H) 5 - 15  Heparin level (unfractionated)     Status: None   Collection Time: 01/20/17  3:58 AM  Result Value Ref Range   Heparin Unfractionated 0.35 0.30 - 0.70 IU/mL  CBC     Status: Abnormal   Collection Time: 01/20/17  3:58 AM  Result Value Ref Range   WBC 8.3 4.0 - 10.5 K/uL   RBC 3.37 (L) 3.87 - 5.11 MIL/uL   Hemoglobin 9.1 (L) 12.0 - 15.0 g/dL   HCT 30.4 (L) 36.0 - 46.0 %   MCV 90.2 78.0 - 100.0 fL   MCH 27.0 26.0 - 34.0 pg   MCHC 29.9 (L) 30.0 - 36.0 g/dL   RDW 25.6 (H) 11.5 - 15.5 %   Platelets 177 150 - 400 K/uL  Magnesium     Status: Abnormal   Collection Time: 01/20/17  3:58 AM  Result Value Ref Range   Magnesium 2.5 (H) 1.7 - 2.4 mg/dL    Protime-INR     Status: Abnormal   Collection Time: 01/20/17  3:58 AM  Result Value Ref Range   Prothrombin Time 20.5 (H) 11.4 - 15.2 seconds   INR 1.74   Renal function panel     Status: Abnormal   Collection Time: 01/20/17  3:58 AM  Result Value Ref Range   Sodium 133 (L) 135 - 145 mmol/L   Potassium 4.6 3.5 - 5.1 mmol/L   Chloride 96 (L) 101 - 111 mmol/L   CO2 20 (L) 22 - 32 mmol/L   Glucose, Bld 89 65 - 99 mg/dL   BUN 27 (H) 6 - 20 mg/dL   Creatinine, Ser 5.54 (H) 0.44 - 1.00 mg/dL   Calcium 10.2 8.9 - 10.3 mg/dL   Phosphorus 6.0 (H) 2.5 - 4.6 mg/dL   Albumin 2.8 (L) 3.5 - 5.0 g/dL   GFR calc non Af Amer 8 (L) >60 mL/min   GFR calc Af Amer 9 (L) >60 mL/min   Anion gap 17 (H) 5 - 15   No results found.  Assessment/Plan: Diagnosis: debility related respiratory failure/sepsis 1. Does the need for close, 24 hr/day medical supervision in concert with the patient's rehab needs make it unreasonable for this patient to be served in a less intensive setting? Yes 2. Co-Morbidities requiring supervision/potential complications: COPD, CKD/ESRD, HTN, morbid obesity 3. Due  to bladder management, bowel management, safety, skin/wound care, disease management, medication administration, pain management and patient education, does the patient require 24 hr/day rehab nursing? Potentially 4. Does the patient require coordinated care of a physician, rehab nurse, PT (1-2 hrs/day, 5 days/week) and OT (1-2 hrs/day, 5 days/week) to address physical and functional deficits in the context of the above medical diagnosis(es)? Potentially Addressing deficits in the following areas: balance, endurance, locomotion, strength, transferring, bowel/bladder control, bathing, dressing, feeding, grooming and toileting 5. Can the patient actively participate in an intensive therapy program of at least 3 hrs of therapy per day at least 5 days per week? No and Potentially 6. The potential for patient to make measurable  gains while on inpatient rehab is fair 7. Anticipated functional outcomes upon discharge from inpatient rehab are n/a  with PT, n/a with OT, n/a with SLP. 8. Estimated rehab length of stay to reach the above functional goals is: see below 9. Does the patient have adequate social supports and living environment to accommodate these discharge functional goals? Potentially 10. Anticipated D/C setting: Other 11. Anticipated post D/C treatments: N/A 12. Overall Rehab/Functional Prognosis: fair  RECOMMENDATIONS: This patient's condition is appropriate for continued rehabilitative care in the following setting: SNF Patient has agreed to participate in recommended program. N/A Note that insurance prior authorization may be required for reimbursement for recommended care.  Comment: Pt with decreased mental status today. Very sedentary prior to this hospitalization. Even with improvement in mental status, I have my doubts as to whether she could tolerate our program. Rehab Admissions Coordinator to follow up.  Thanks,  Meredith Staggers, MD, [place]ilford Pillar, PA-C 01/20/2017

## 2017-01-20 NOTE — Progress Notes (Signed)
[facility] Progress Note Patient Name: ZAREAH HUNZEKER DOB: April 17, 1956 MRN: [id number]   Date of Service  01/20/2017  HPI/Events of Note  Patient status post cardioversion on heparin drip. Altered mentation since yesterday per resident. Notified patient now hypotensive. Vasopressor has been ordered and patient has central IV access. Review of head CT imaging shows ICH which appears new.   eICU Interventions  1. Resident to notify neurology & ensure heparin drip is immediately stop 2. Transitioning patient to ICU 3. Intensivist notified of possible need for endotracheal intubation and further stabilization      Intervention Category Major Interventions: Change in mental status - evaluation and management;Shock - evaluation and management;Hemorrhage - evaluation and management  Tera Partridge 01/20/2017, 3:34 PM

## 2017-01-20 NOTE — Progress Notes (Signed)
Dr. Ashok Cordia spoke with regarding BP and inconsistent readings due to pt's anatomy. Orders received for RT to place aline. Also asked about holding amiodarone due to pt's NPO status. Instructed to call Cards. Cards to place on amio gtt. Will continue to monitor closely. Eleonore Chiquito RN 2 Heart

## 2017-01-20 NOTE — Progress Notes (Signed)
  Amiodarone Drug - Drug Interaction Consult Note  Recommendations: No major interaction identified at this time Amiodarone is metabolized by the cytochrome P450 system and therefore has the potential to cause many drug interactions. Amiodarone has an average plasma half-life of 50 days (range 20 to 100 days).   There is potential for drug interactions to occur several weeks or months after stopping treatment and the onset of drug interactions may be slow after initiating amiodarone.   []  Statins: Increased risk of myopathy. Simvastatin- restrict dose to 20mg  daily. Other statins: counsel patients to report any muscle pain or weakness immediately.  []  Anticoagulants: Amiodarone can increase anticoagulant effect. Consider warfarin dose reduction. Patients should be monitored closely and the dose of anticoagulant altered accordingly, remembering that amiodarone levels take several weeks to stabilize.  []  Antiepileptics: Amiodarone can increase plasma concentration of phenytoin, the dose should be reduced. Note that small changes in phenytoin dose can result in large changes in levels. Monitor patient and counsel on signs of toxicity.  []  Beta blockers: increased risk of bradycardia, AV block and myocardial depression. Sotalol - avoid concomitant use.  []   Calcium channel blockers (diltiazem and verapamil): increased risk of bradycardia, AV block and myocardial depression.  []   Cyclosporine: Amiodarone increases levels of cyclosporine. Reduced dose of cyclosporine is recommended.  []  Digoxin dose should be halved when amiodarone is started.  []  Diuretics: increased risk of cardiotoxicity if hypokalemia occurs.  []  Oral hypoglycemic agents (glyburide, glipizide, glimepiride): increased risk of hypoglycemia. Patient's glucose levels should be monitored closely when initiating amiodarone therapy.   []  Drugs that prolong the QT interval:  Torsades de pointes risk may be increased with concurrent  use - avoid if possible.  Monitor QTc, also keep magnesium/potassium WNL if concurrent therapy can't be avoided. [name] Antibiotics: e.g. fluoroquinolones, erythromycin. . Antiarrhythmics: e.g. quinidine, procainamide, disopyramide, sotalol. . Antipsychotics: e.g. phenothiazines, haloperidol.  . Lithium, tricyclic antidepressants, and methadone. Thank You,  Narda Bonds  01/20/2017 11:52 PM

## 2017-01-20 NOTE — Progress Notes (Signed)
Assessment/Plan: 1 AKI/CKD4,? ESRD. Oliguric. .  2 Anemia ESA, FE 3 HPTH vit D 4 DM controlled 5 CM still primary issue 6 Afib on Amio  7 Massive obesity 8 VRE sepsis on Amp 9 Altered mental status prob toxic metabolic P No urgent indication for HD today.   Subjective: Interval History: less oriented this am  Objective: Vital signs in last 24 hours: Temp:  [97.7 F (36.5 C)-98.7 F (37.1 C)] 98.4 F (36.9 C) (04/12 1132) Pulse Rate:  [63-106] 101 (04/12 1132) Resp:  [14-30] 15 (04/12 1132) BP: (81-103)/(54-76) 97/64 (04/12 1132) SpO2:  [90 %-100 %] 97 % (04/12 1132) Weight:  [131 kg (288 lb 12.8 oz)] 131 kg (288 lb 12.8 oz) (04/12 5701) Weight change: -0.543 kg (-1 lb 3.2 oz)  Intake/Output from previous day: 04/11 0701 - 04/12 0700 In: 901 [P.O.:125; I.V.:576; IV Piggyback:200] Out: 0  Intake/Output this shift: Total I/O In: 100 [IV Piggyback:100] Out: -   General appearance: alert and mild confusion Resp: clear to auscultation bilaterally Chest wall: no tenderness GI: soft, non-tender; bowel sounds normal; no masses,  no organomegaly  Mild asterixis  Lab Results:  Recent Labs  01/19/17 0547 01/20/17 0358  WBC 8.0 8.3  HGB 9.3* 9.1*  HCT 31.8* 30.4*  PLT 210 177   BMET:  Recent Labs  01/19/17 1555 01/20/17 0358  NA 133* 133*  K 4.7 4.6  CL 95* 96*  CO2 20* 20*  GLUCOSE 97 89  BUN 24* 27*  CREATININE 4.99* 5.54*  CALCIUM 10.6* 10.2   No results for input(s): PTH in the last 72 hours. Iron Studies: No results for input(s): IRON, TIBC, TRANSFERRIN, FERRITIN in the last 72 hours. Studies/Results: No results found.  Scheduled: . amiodarone  400 mg Oral BID  . ampicillin (OMNIPEN) IV  2 g Intravenous Q12H  . bisacodyl  10 mg Oral Daily  . cefTRIAXone (ROCEPHIN)  IV  2 g Intravenous Q12H  . Chlorhexidine Gluconate Cloth  6 each Topical Daily  . darbepoetin (ARANESP) injection - NON-DIALYSIS  150 mcg Subcutaneous Q Tue-1800  . fluticasone   1 spray Each Nare BID  . mouth rinse  15 mL Mouth Rinse BID  . midodrine  10 mg Oral TID WC  . multivitamin  1 tablet Oral QHS  . naLOXone (NARCAN)  injection  0.4 mg Intravenous Once  . Warfarin - Pharmacist Dosing Inpatient   Does not apply q1800    LOS: 25 days   Kambri Dismore C 01/20/2017,12:02 PM

## 2017-01-20 NOTE — Consult Note (Signed)
Requesting Physician: Dr. Evette Doffing    Chief Complaint: bleed on CT  History obtained from:   chart  HPI:                                                                                                                                         Paige Marshall is an 61 y.o. female with hypertension obesity, PAF on Coumadin as admitted to the hospital on 12/26/2016 with respiratory distress secondary to volume overload. Today patient went under a cardioversion post cardioversion patient was noted to be altered and not speaking. Due to patient remaining altered patient had a CT of head which showed a left frontal intracranial hemorrhage that looks as though it is in the same area twitch she has a hygroma. Awaiting final reading. Currently at bedside patient is moving all extremities does not show facial droop however she does appear very a phasic and confused. Patient does not follow commands nor does she speak. Currently cardiology is working on increasing her blood pressure. Her blood pressure is systolically in the 025K and stable.  Date last known well: Date: 01/20/2017 Time last known well: Unable to determine tPA Given: No: ICH   Intracerebral Hemorrhage (ICH) Score 0  Past Medical History:  Diagnosis Date  . Asthma   . Chronic combined systolic and diastolic CHF (congestive heart failure) ([place])   . CKD (chronic kidney disease), stage III   . COPD (chronic obstructive pulmonary disease) ([place])   . Degenerative joint disease of both lower legs   . Former smoker   . Gout    hx in "both knees" (01/14/2015)  . H. pylori infection 01/03/2014   +breath test.    . High cholesterol   . Hypertension   . Morbid obesity ([place])   . NICM (nonischemic cardiomyopathy) ([place])    a. LHC 01/2015: normal cors, EF 45-50%. b. EF 50-55% in 08/2016. c. EF 33% 01/2017  . OSA (obstructive sleep apnea)   . Persistent atrial fibrillation ([place])   . Pneumonia X 2  . Pulmonary hypertension   . Rheumatoid  arthritis ([place])   . Stroke [facility])     Past Surgical History:  Procedure Laterality Date  . ANGIOGRAM/LV (CONGENITAL)  2007  . BREATH TEK H PYLORI N/A 12/31/2013   Procedure: BREATH TEK H PYLORI;  Surgeon: Gayland Curry, MD;  Location: [facility];  Service: General;  Laterality: N/A;  . CARDIOVERSION N/A 01/13/2017   Procedure: CARDIOVERSION;  Surgeon: Jolaine Artist, MD;  Location: [place];  Service: Cardiovascular;  Laterality: N/A;  . CORNEAL TRANSPLANT Right ~ 2010  . CORONARY ANGIOPLASTY WITH STENT PLACEMENT  11/04/2005   "1"  . DOPPLER ECHOCARDIOGRAPHY     2 D   EF of 45%  . EYE SURGERY    . IR GENERIC HISTORICAL  01/06/2017   IR FLUORO GUIDE CV LINE RIGHT 01/06/2017 Corrie Mckusick, DO  [facility]  . IR GENERIC HISTORICAL  01/06/2017   IR US GUIDE VASC ACCESS RIGHT 01/06/2017 Corrie Mckusick, DO [facility]  . LEFT HEART CATHETERIZATION WITH CORONARY ANGIOGRAM N/A 01/20/2015   Procedure: LEFT HEART CATHETERIZATION WITH CORONARY ANGIOGRAM;  Surgeon: Lorretta Harp, MD;  Location: [facility] CATH LAB;  Service: Cardiovascular;  Laterality: N/A;  . RIGHT HEART CATH N/A 01/07/2017   Procedure: Right Heart Cath;  Surgeon: Jolaine Artist, MD;  Location: [place] CV LAB;  Service: Cardiovascular;  Laterality: N/A;  . sleep study  2011  . stress myocardial dipyridamole perfusion    . TEE WITHOUT CARDIOVERSION N/A 01/13/2017   Procedure: TRANSESOPHAGEAL ECHOCARDIOGRAM (TEE);  Surgeon: Jolaine Artist, MD;  Location: [place];  Service: Cardiovascular;  Laterality: N/A;  . [place]  . venous duplex ultrasound  2013    Family History  Problem Relation Age of Onset  . Heart disease Mother   . Cancer Father     colon  . Hypertension Other    Social History:  reports that she quit smoking about 8 years ago. Her smoking use included Cigarettes. She has a 11.55 pack-year smoking history. She has never used smokeless tobacco. She reports that she does not drink alcohol or  use drugs.  Allergies:  Allergies  Allergen Reactions  . Penicillins Hives, Itching, Swelling and Other (See Comments)    Tongue swelling Has patient had a PCN reaction causing immediate rash, facial/tongue/throat swelling, SOB or lightheadedness with hypotension: Yes  Clarified with the patient her penicillin allergy. Pt has tolerated cefepime and ampicillin.    . Citrus Hives    Medications:                                                                                                                           Prior to Admission:  Prescriptions Prior to Admission  Medication Sig Dispense Refill Last Dose  . ARTIFICIAL TEAR OP Apply 1 drop to eye every 6 (six) hours as needed (dry eyes).    at prn  . carvedilol (COREG) 6.25 MG tablet Take 1 tablet (6.25 mg total) by mouth 2 (two) times daily with a meal.   12/26/2016 at 0900  . diclofenac sodium (VOLTAREN) 1 % GEL Apply 4 g topically every 6 (six) hours as needed (pain).     at prn  . diltiazem (DILACOR XR) 180 MG 24 hr capsule Take 180 mg by mouth daily.   12/26/2016 at 0900  . docusate sodium (COLACE) 100 MG capsule Take 1 capsule (100 mg total) by mouth 2 (two) times daily. 10 capsule 0 12/26/2016 at 0900  . ferrous sulfate 325 (65 FE) MG tablet Take 1 tablet (325 mg total) by mouth 3 (three) times daily with meals.  3 12/26/2016 at 0900  . furosemide (LASIX) 40 MG tablet Take 1 tablet (40 mg total) by mouth 2 (two) times daily. (Patient taking differently: Take 80 mg by mouth 2 (two)  times daily. ) 60 tablet 0 12/26/2016 at 0900  . metolazone (ZAROXOLYN) 2.5 MG tablet Take 1 tablet (2.5 mg total) by mouth daily. 90 tablet 1 12/26/2016 at 0830  . Multiple Vitamin (MULTIVITAMIN WITH MINERALS) TABS tablet Take 1 tablet by mouth daily.   12/26/2016 at 0900  . omega-3 acid ethyl esters (LOVAZA) 1 g capsule Take 1 g by mouth 2 (two) times daily.   12/26/2016 at 0900  . oxyCODONE (OXY IR/ROXICODONE) 5 MG immediate release tablet Take 1 tablet (5  mg total) by mouth every 6 (six) hours as needed for breakthrough pain. 15 tablet 0 12/25/2016 at 1537  . polyethylene glycol (MIRALAX / GLYCOLAX) packet Take 17 g by mouth daily as needed. (Patient taking differently: Take 17 g by mouth daily as needed (constipation). Mix in 8 oz liquid and drink) 14 each 0  at prn  . potassium chloride SA (K-DUR,KLOR-CON) 20 MEQ tablet Take 40 mEq by mouth 3 (three) times daily.    12/26/2016 at 1300  . predniSONE (DELTASONE) 10 MG tablet Take 40 mg tablet 11/14/2016 and taper down by 10 mg daily until completed 15 tablet 0 12/25/2016 at 1700  . PROAIR HFA 108 (90 Base) MCG/ACT inhaler INHALE 2 PUFFS EVERY 6 HOURS AS NEEDED FOR WHEEZING OR SHORTNESS OF BREATH. 8.5 Inhaler 1  at prn  . promethazine (PHENERGAN) 25 MG tablet Take 25 mg by mouth every 6 (six) hours as needed for nausea or vomiting.    at prn  . propafenone (RYTHMOL) 150 MG tablet Take 1 tablet (150 mg total) by mouth every 8 (eight) hours. 10 tablet 0 12/26/2016 at 0800  . senna-docusate (SENNA S) 8.6-50 MG tablet Take 2 tablets by mouth See admin instructions. Take 2 tablet by mouth daily at bedtime, may also take 2 tablets as needed for constipation   12/25/2016 at 2100  . warfarin (COUMADIN) 5 MG tablet Take 5 mg by mouth daily.   12/25/2016 at 1700  . zolpidem (AMBIEN) 5 MG tablet Take 5 mg by mouth at bedtime as needed for sleep.    at prn  . bisacodyl (DULCOLAX) 5 MG EC tablet Take 1 tablet (5 mg total) by mouth daily as needed for moderate constipation. (Patient not taking: Reported on 12/26/2016) 30 tablet 0 Not Taking at Unknown time  . PRESCRIPTION MEDICATION Inhale into the lungs at bedtime. BIPAP   Taking   Scheduled: . amiodarone  400 mg Oral BID  . ampicillin (OMNIPEN) IV  2 g Intravenous Q12H  . bisacodyl  10 mg Oral Daily  . cefTRIAXone (ROCEPHIN)  IV  2 g Intravenous Q12H  . Chlorhexidine Gluconate Cloth  6 each Topical Daily  . darbepoetin (ARANESP) injection - NON-DIALYSIS  150 mcg  Subcutaneous Q Tue-1800  . fluticasone  1 spray Each Nare BID  . mouth rinse  15 mL Mouth Rinse BID  . midodrine  10 mg Oral TID WC  . multivitamin  1 tablet Oral QHS    ROS:  History obtained from unobtainable from patient due to A phasic  General ROS: negative for - chills, fatigue, fever, night sweats, weight gain or weight loss Psychological ROS: negative for - behavioral disorder, hallucinations, memory difficulties, mood swings or suicidal ideation Ophthalmic ROS: negative for - blurry vision, double vision, eye pain or loss of vision ENT ROS: negative for - epistaxis, nasal discharge, oral lesions, sore throat, tinnitus or vertigo Allergy and Immunology ROS: negative for - hives or itchy/watery eyes Hematological and Lymphatic ROS: negative for - bleeding problems, bruising or swollen lymph nodes Endocrine ROS: negative for - galactorrhea, hair pattern changes, polydipsia/polyuria or temperature intolerance Respiratory ROS: negative for - cough, hemoptysis, shortness of breath or wheezing Cardiovascular ROS: negative for - chest pain, dyspnea on exertion, edema or irregular heartbeat Gastrointestinal ROS: negative for - abdominal pain, diarrhea, hematemesis, nausea/vomiting or stool incontinence Genito-Urinary ROS: negative for - dysuria, hematuria, incontinence or urinary frequency/urgency Musculoskeletal ROS: negative for - joint swelling or muscular weakness Neurological ROS: as noted in HPI Dermatological ROS: negative for rash and skin lesion changes  Neurologic Examination:                                                                                                      Blood pressure (!) 94/56, pulse 71, temperature 98.4 F (36.9 C), temperature source Oral, resp. rate 18, height 5' 7.5" (1.715 m), weight 131 kg (288 lb 12.8 oz), last  menstrual period 09/07/2011, SpO2 98 %.  HEENT-  Normocephalic, no lesions, without obvious abnormality.  Normal external eye and conjunctiva.  Normal TM's bilaterally.  Normal auditory canals and external ears. Normal external nose, mucus membranes and septum.  Normal pharynx. Cardiovascular- S1, S2 normal, pulses palpable throughout   Lungs- chest clear, no wheezing, rales, normal symmetric air entry Abdomen- normal findings: bowel sounds normal Extremities- no edema Lymph-no adenopathy palpable Musculoskeletal-no joint tenderness, deformity or swelling Skin-warm and dry, no hyperpigmentation, vitiligo, or suspicious lesions  Neurological Examination Mental Status: Alert, does not follow commands, looks around the room and scans the room when name is called looks toward the voice. Cranial Nerves: II: Blinks to threat bilaterally  III,IV, VI: ptosis not present, extra-ocular motions intact bilaterally, pupils equal, round, reactive to light and accommodation V,VII: smile symmetric, facial light touch sensation normal bilaterally VIII: hearing normal bilaterally IX,X: uvula rises symmetrically XI: bilateral shoulder shrug XII: midline tongue extension Motor: Moving all extremities antigravity with good strength. Withdraws from noxious stimuli very briskly Sensory: Draws from noxious stimuli briskly in all 4 extremities Deep Tendon Reflexes: 1+ and symmetric throughout Plantars: Right: downgoing   Left: downgoing Cerebellar: Due to aphasia was unable to follow my commands Gait: Tested       Lab Results: Basic Metabolic Panel:  Recent Labs Lab 01/16/17 0401  01/17/17 0356  01/18/17 0333 01/18/17 1544 01/19/17 0547 01/19/17 1555 01/20/17 0358  NA 137  < > 136  < > 135 135 134* 133* 133*  K 3.9  < > 4.1  < > 4.1 4.3 4.3 4.7 4.6  CL 99*  < >  99*  < > 96* 95* 94* 95* 96*  CO2 25  < > 26  < > 24 24 22  20* 20*  GLUCOSE 88  < > 90  < > 92 114* 88 97 89  BUN 6  < > 6  < > 9  16 20  24* 27*  CREATININE 1.49*  < > 1.65*  < > 2.90* 3.66* 4.24* 4.99* 5.54*  CALCIUM 9.2  < > 9.4  < > 10.1 10.1 10.4* 10.6* 10.2  MG 2.5*  --  2.6*  --  2.6*  --  2.5*  --  2.5*  PHOS 2.7  < > 2.6  < > 3.7 3.9 4.2 5.5* 6.0*  < > = values in this interval not displayed.  Liver Function Tests:  Recent Labs Lab 01/18/17 1544 01/19/17 0547 01/19/17 1555 01/20/17 0358 01/20/17 0930  AST  --   --   --   --  33  ALT  --   --   --   --  18  ALKPHOS  --   --   --   --  74  BILITOT  --   --   --   --  0.8  PROT  --   --   --   --  6.1*  ALBUMIN 2.8* 2.8* 3.0* 2.8* 2.8*   No results for input(s): LIPASE, AMYLASE in the last 168 hours. No results for input(s): AMMONIA in the last 168 hours.  CBC:  Recent Labs Lab 01/15/17 0514 01/17/17 1550 01/18/17 0333 01/19/17 0547 01/20/17 0358  WBC 5.7 8.2 10.2 8.0 8.3  HGB 9.2* 10.1* 10.3* 9.3* 9.1*  HCT 32.1* 34.8* 35.3* 31.8* 30.4*  MCV 90.7 92.1 91.5 91.4 90.2  PLT 223 252 249 210 177    Cardiac Enzymes: No results for input(s): CKTOTAL, CKMB, CKMBINDEX, TROPONINI in the last 168 hours.  Lipid Panel: No results for input(s): CHOL, TRIG, HDL, CHOLHDL, VLDL, LDLCALC in the last 168 hours.  CBG: No results for input(s): GLUCAP in the last 168 hours.  Microbiology: Results for orders placed or performed during the hospital encounter of 12/26/16  MRSA PCR Screening     Status: None   Collection Time: 12/27/16  3:22 PM  Result Value Ref Range Status   MRSA by PCR NEGATIVE NEGATIVE Final    Comment:        The GeneXpert MRSA Assay (FDA approved for NASAL specimens only), is one component of a comprehensive MRSA colonization surveillance program. It is not intended to diagnose MRSA infection nor to guide or monitor treatment for MRSA infections.   Culture, blood (Routine X 2) w Reflex to ID Panel     Status: Abnormal   Collection Time: 01/09/17 12:32 AM  Result Value Ref Range Status   Specimen Description BLOOD RIGHT  HAND  Final   Special Requests   Final    IN PEDIATRIC BOTTLE Blood Culture adequate volume 3CC   Culture  Setup Time   Final    GRAM POSITIVE COCCI IN PAIRS IN CHAINS IN PEDIATRIC BOTTLE Organism ID to follow CRITICAL RESULT CALLED TO, READ BACK BY AND VERIFIED WITH: A. JOHNSTON, PHARM, 01/09/17 AT 1733 BY J FUDESCO    Culture VANCOMYCIN RESISTANT ENTEROCOCCUS (A)  Final   Report Status 01/11/2017 FINAL  Final   Organism ID, Bacteria VANCOMYCIN RESISTANT ENTEROCOCCUS  Final      Susceptibility   Vancomycin resistant enterococcus - MIC*    AMPICILLIN <=2 SENSITIVE Sensitive  VANCOMYCIN >=32 RESISTANT Resistant     GENTAMICIN SYNERGY SENSITIVE Sensitive     LINEZOLID 2 SENSITIVE Sensitive     * VANCOMYCIN RESISTANT ENTEROCOCCUS  Blood Culture ID Panel (Reflexed)     Status: Abnormal   Collection Time: 01/09/17 12:32 AM  Result Value Ref Range Status   Enterococcus species DETECTED (A) NOT DETECTED Final    Comment: CRITICAL RESULT CALLED TO, READ BACK BY AND VERIFIED WITH: A. JOHNSTON, PHARM, 01/09/17, AT 1833 BY J FUDESCO    Vancomycin resistance DETECTED (A) NOT DETECTED Final    Comment: CRITICAL RESULT CALLED TO, READ BACK BY AND VERIFIED WITH: A JOHNSTON, PHARM, 01/09/17 AT 1833 BY J FUDESCO    Listeria monocytogenes NOT DETECTED NOT DETECTED Final   Staphylococcus species NOT DETECTED NOT DETECTED Final   Staphylococcus aureus NOT DETECTED NOT DETECTED Final   Streptococcus species NOT DETECTED NOT DETECTED Final   Streptococcus agalactiae NOT DETECTED NOT DETECTED Final   Streptococcus pneumoniae NOT DETECTED NOT DETECTED Final   Streptococcus pyogenes NOT DETECTED NOT DETECTED Final   Acinetobacter baumannii NOT DETECTED NOT DETECTED Final   Enterobacteriaceae species NOT DETECTED NOT DETECTED Final   Enterobacter cloacae complex NOT DETECTED NOT DETECTED Final   Escherichia coli NOT DETECTED NOT DETECTED Final   Klebsiella oxytoca NOT DETECTED NOT DETECTED Final    Klebsiella pneumoniae NOT DETECTED NOT DETECTED Final   Proteus species NOT DETECTED NOT DETECTED Final   Serratia marcescens NOT DETECTED NOT DETECTED Final   Haemophilus influenzae NOT DETECTED NOT DETECTED Final   Neisseria meningitidis NOT DETECTED NOT DETECTED Final   Pseudomonas aeruginosa NOT DETECTED NOT DETECTED Final   Candida albicans NOT DETECTED NOT DETECTED Final   Candida glabrata NOT DETECTED NOT DETECTED Final   Candida krusei NOT DETECTED NOT DETECTED Final   Candida parapsilosis NOT DETECTED NOT DETECTED Final   Candida tropicalis NOT DETECTED NOT DETECTED Final  Culture, blood (Routine X 2) w Reflex to ID Panel     Status: Abnormal   Collection Time: 01/09/17 12:41 AM  Result Value Ref Range Status   Specimen Description BLOOD LEFT HAND  Final   Special Requests   Final    IN PEDIATRIC BOTTLE Blood Culture adequate volume 3CC   Culture  Setup Time   Final    GRAM POSITIVE COCCI IN CHAINS IN PAIRS CRITICAL VALUE NOTED.  VALUE IS CONSISTENT WITH PREVIOUSLY REPORTED AND CALLED VALUE.    Culture (A)  Final    ENTEROCOCCUS FAECALIS SUSCEPTIBILITIES PERFORMED ON PREVIOUS CULTURE WITHIN THE LAST 5 DAYS.    Report Status 01/11/2017 FINAL  Final  Culture, blood (Routine X 2) w Reflex to ID Panel     Status: None   Collection Time: 01/10/17 10:43 AM  Result Value Ref Range Status   Specimen Description BLOOD LEFT ANTECUBITAL  Final   Special Requests IN PEDIATRIC BOTTLE Blood Culture adequate volume  Final   Culture NO GROWTH 5 DAYS  Final   Report Status 01/15/2017 FINAL  Final  Culture, blood (Routine X 2) w Reflex to ID Panel     Status: None   Collection Time: 01/10/17 10:48 AM  Result Value Ref Range Status   Specimen Description BLOOD LEFT ANTECUBITAL  Final   Special Requests IN PEDIATRIC BOTTLE AEROMONAS CAVIAE  Final   Culture NO GROWTH 5 DAYS  Final   Report Status 01/15/2017 FINAL  Final    Coagulation Studies:  Recent Labs  01/18/17 0927  01/19/17 0547 01/20/17 0358  LABPROT 16.6* 18.1* 20.5*  INR 1.33 1.48 1.74    Imaging: No results found.     Assessment and plan discussed with with attending physician and they are in agreement.    Etta Quill PA-C [facility] [phone]  01/20/2017, 3:17 PM   Assessment: 61 y.o. female with significant hypotension post cardioversion and left frontal intracranial hemorrhage noted on CT status post cardioversion. At current time patient is moving all extremities does not show any localizing or lateralizing abnormalities other than the fact that she appears to be totally a phasic. Where the bleed is does not correlate with being totally a phasic. This brings to question if she possibly has showered emboli versus possibly is having subclinical seizures.  Stroke Risk Factors - hyperlipidemia and hypertension  Recommend: 1 keep systolic blood pressure less than 035 and diastolic blood pressure less than 90 2 EEG 3 MRI brain when stable 4. ECT of head in the morning or if any significant neurological changes occur

## 2017-01-20 NOTE — Progress Notes (Addendum)
During initial a.m. assessment, patient was noted to be confused. Per night shift RN, patient had been alert and oriented x4 during the shift. During bedside report at change of shift, patient was sleeping. This morning patient is only alert to self and is able to state name and birthdate. Internal medicine resident is aware of patient's new confusion.   Spoke with Dr. Florene Glen w/ nephrology who stated that patient should not receive PICC line. Per Beaulah Corin NP w/ critical care, patient's central line needs to be d/c today; IV team was paged and another peripheral IV site was obtained. Heart failure team aware that patient's CVPs will be d/c'd. Patient's central line was d/c per order and dressing applied to site; no complications.  Spoke with Dr. Florene Glen about patient's increased confusion today; made him aware of patient's ABG results. Received order to give 0.4 mg IV narcan x1.   Patient's sister expressing concern about patient's mental status change and requesting to speak to MD. Paged internal medicine resident; resident will be up to reassess patient later today.

## 2017-01-20 NOTE — Progress Notes (Signed)
Patient's sister at bedside and concerned about patients confusion and garbled speech.  Sister states patient is normally very alert, oriented, and interactive with family and staff.  States she was interactive and oriented yesterday in the ICU as well and sister would like to speak with a physician.  Dr. Wynetta Emery paged and notified.  Sanda Linger

## 2017-01-20 NOTE — Progress Notes (Signed)
Hospital Course: Paige Marshall is a 61 yo female with PMHx of COPD on 3L [place], CKD IV, HTN, Obesity, PAF on coumadin, Combined HF, OHS, nonischemic cardiomyopathy, pulmonary hypertension who is initially admitted to the hospital on 12/26/16 with respiratory distress secondary to volume overload from acute on chronic CHF exacerbation and acute on chronic CKD and concern for cardiorenal syndrome. Patient was also found to be in atrial fibrillation with RVR at that time. Patient was started on Lasix and metolazone for diuresis. Milrinone and amiodarone were started. However, due to worsening renal status and respiratory status, patient was moved to the ICU on 3/22 for CRRT. 3/22: Started CRRT 3/27: Failed DC Cardioversion 4/1: Vancomycin Resistant Enterococcal Bacteremia, started on Daptomycin 4/6: Failed DC Cardioversion 4/8: Transitioned off of CRRT 4/11: Transferred out of ICU  Subjective: Patient was seen and examined this morning. Patient is awake, but lethargic. She is very confused. After talking with her friend who has been visiting her daily, this appears to be a new issue. Patient was somnolent yesterday and confusion is new today. She is oriented to self only. To the question where is she, she answers "shortness of breath." Year "1968." She tells her friend to "shock it again."  She denies any complaints.   Objective: Vital signs in last 24 hours: Vitals:   01/20/17 0028 01/20/17 0343 01/20/17 0632 01/20/17 0800  BP: (!) 82/65 90/61  (!) 103/59  Pulse:  (!) 106  93  Resp: 15   16  Temp:  97.8 F (36.6 C)  98.3 F (36.8 C)  TempSrc:  Oral  Oral  SpO2:  94%  90%  Weight:   288 lb 12.8 oz (131 kg)   Height:       Physical Exam General: Vital signs reviewed.  Patient is lethargic, confused, in no acute distress and cooperative with exam.  Eyes: Conjunctivae normal, no scleral icterus.  Ears, Nose, Throat, and Mouth: Moist mucous membranes.  Cardiovascular: Irregularly irregular,  rate controlled,  no murmurs, gallops, or rubs. No carotid bruit present. Trace lower extremity edema bilaterally. Bilateral radial and pedal pulses are intact and symmetric bilaterally.  Pulmonary: Decreased breath sounds, overall clear, no wheezes, rales, or rhonchi. No accessory muscle use. Gastrointestinal: Soft, non-tender, non-distended, BS +  Neurologic: Awake, but somnolent, oriented only to self, disoriented, confused, moving all extremities equally. +Asterixis Skin: Warm, dry and intact. No rashes or erythema. Psychiatric: Normal mood and affect. speech and behavior is normal. Cognition and memory are abnormal.   Microbiology: BCx 4/1 >> VRE in 2/2 BCx 4/2 >> NGTD  Antibiotics: Vancomycin 4/1 >>4/3 Daptomycin 4/3 >>4/5 Ampicillin 4/5 >> Rocephin 4/5 >>  Lines: Rt IJ HD cath 3/31 >> Lt IJ CVL 4/2 >>  Assessment/Plan: Principal Problem:   VRE bacteremia Active Problems:   Essential hypertension   Obstructive sleep apnea   Morbid obesity ([place])   Obesity hypoventilation syndrome (HCC)   COPD mixed type (HCC)   Chronic respiratory failure with hypoxia (HCC)   Acute kidney injury superimposed on CKD ([place])   Acute on chronic combined systolic and diastolic CHF (congestive heart failure) (HCC)   Thoracic aortic atherosclerosis (HCC)   Cardiorenal syndrome   PAH (pulmonary artery hypertension)   Chronic atrial fibrillation [facility])  Paige Marshall is a 61 yo female with PMHx of COPD on 3L [place], CKD IV, HTN, Obesity, PAF on coumadin, Combined HF, OHS, nonischemic cardiomyopathy, pulmonary hypertension who is initially admitted to the hospital on 12/26/16 with respiratory distress secondary  to volume overload from acute on chronic CHF exacerbation, acute on chronic CKD, afib with RVR and concern for cardiorenal syndrome now s/p CRRT and Cardioversion.   Atrial Fibrillation: S/P 2 failed DCCV. Cardiology is considering repeating DCCV 4/13 given continued atrial fibrillation with RVR. If  failed, would consider nodal ablation and BiV pacing once cleared from VRE bacteremia. On heparin, transitioning to coumadin. INR 1.74 this morning.  -Amiodarone 400 mg BID -Heparin gtt bridging to Coumadin -Daily INR checks -Plans for DCCV 4/13 -NPO for DCCV  Acute Encephalopathy: Very confused this morning which appears to be a new issue. Possibly due hypercarbia given refusal to wear CPAP last night, uremia from kidney failure; however, BUN only 27. +Asterixis on exam. Other differentials include medication effect (Xanax given >48 hours ago, oxycodone given >28 hours ago). Will check ABG and LFTs. Holding Xanax and Oxycodone.  -ABG -LFTs -Holding Xanax and Oxycodone  Acute on Chronic CKD V with Cardiorenal Syndrome: Anuric, 0 mL recorded output yesterday. Creatinine increasing from 2>3>4>5.54 this morning. BUN increasing to 27. Bicarb 20. Now off of CRRT on 4/8, may require HD.  -Nephrology following -May require HD -Strict I/Os -RFP BID  VRE Bacteremia: Afebrile, no leukocytosis. TEE on 4/5 was without evidence of endocarditis. Repeat BCx are NGTD.  -Ampicillin 2 g BID until 4/18 -Ceftriaxone 2 gram BID until 4/18 -Will need repeat BCx 2 weeks after stop date of abx  Acute on Chronic Hypoxic and Hypercarbic Respiratory Failure with OHS: Satting well on 2L [place].  -CPAP QHS -Supplemental O2  Acute on Chronic Combined CHF: EF 25-30% on TEE 4/5 which is decreased from March 2018 at 55%.  -Heart Failure Following -Holding BB and ACEI due to low BPs and ARF  Hypotension: Soft, 90/60-100s/70s. On Midrodrine. Off Levophed.  -Continue Midodrine 10 mg TID -Off Levophed  Chronic Normocytic Anemia: Hgb 9.1 this morning, stable.  -Continue to monitor intermittently  Anxiety: Has not received in 2 days.  -Holding Xanax 0.25 mg Q6H prn  Constipation: -Bisacodyl 10 mg QD -Senokot QHS prn   Chronic Pain: Appears to be on Oxycodone 5 mg Q6H prn at home. Has not received in 24  hours. -Holding Oxycodone 5 mg Q6H prn due to lethargy and confusion  DVT/PE ppx: Heparin/Warfarin per pharmacy FEN: CM, Currently NPO for DCCV tomorrow CODE: FULL  Dispo: Anticipated discharge in approximately 3 day(s).   LOS: 25 days   Martyn Malay, DO PGY-3 Internal Medicine Resident Pager # [phone] 01/20/2017 8:54 AM

## 2017-01-20 NOTE — Progress Notes (Signed)
PULMONARY / CRITICAL CARE MEDICINE   Name: Paige Marshall MRN: [id number] DOB: Aug 21, 1956    ADMISSION DATE:  12/26/2016 CONSULTATION DATE:  01/20/2017  REFERRING MD:  Dr. Evette Doffing   CHIEF COMPLAINT:  AMS  Brief:   61 year old woman with poorly controlled OHS, chronic combined systolic and diastolic CHF, a-fib/a-flutter on chronic anticoagulation, hypertension, COPD, CKD Stage 3. Admitted to ED from [place] on 3/18 for  acute decompensated CHF complicated by cardiorenal syndrome. During stay developed acute on chronic kidney disease requiring CRRT, now being followed by nephrology for PRN HD. Blood culture positive for VRE bacteremia being treated with ampicillin and ceftriaxone. Transferred to step-down on 4/11. On 4/12 noted to be more lethargic and aphasic. CT Head revealed mixed density mass like area along the surface of the anterior frontal lobe with cytotoxic edema as well as a rounded 2 cm hemorrhage. Patient to be moved to ICU and PCCM to consult.    SUBJECTIVE:  Rapid Response - patient unable to answer questions as aphasic. No distress   VITAL SIGNS: BP (!) 81/64 (BP Location: Left Wrist)   Pulse 73   Temp 98.4 F (36.9 C) (Oral)   Resp 15   Ht 5' 7.5" (1.715 m)   Wt 131 kg (288 lb 12.8 oz)   LMP 09/07/2011   SpO2 98%   BMI 44.57 kg/m   HEMODYNAMICS: CVP:  [8 mmHg-9 mmHg] 8 mmHg  VENTILATOR SETTINGS:    INTAKE / OUTPUT: I/O last 3 completed shifts: In: 3790 [P.O.:190; I.V.:936; IV Piggyback:350] Out: 0   PHYSICAL EXAMINATION: General:  Adult female, no distress  Neuro:  Aphasic, moves all extremities, follows commands    HEENT:  Normocephalic  Cardiovascular:  RRR, no MRG, NI S1/S2 Lungs:  diminished breath sounds, non-labored, no wheeze  Abdomen:  Obese, active bowel sounds  Musculoskeletal:  No acute  Skin:  Warm, dry, intact   LABS:  BMET  Recent Labs Lab 01/19/17 1555 01/20/17 0358 01/20/17 1509  NA 133* 133* 134*  K 4.7 4.6 5.2*  CL 95*  96* 97*  CO2 20* 20* 19*  BUN 24* 27* 31*  CREATININE 4.99* 5.54* 6.13*  GLUCOSE 97 89 83    Electrolytes  Recent Labs Lab 01/18/17 0333  01/19/17 0547 01/19/17 1555 01/20/17 0358 01/20/17 1509  CALCIUM 10.1  < > 10.4* 10.6* 10.2 10.0  MG 2.6*  --  2.5*  --  2.5*  --   PHOS 3.7  < > 4.2 5.5* 6.0* 5.9*  < > = values in this interval not displayed.  CBC  Recent Labs Lab 01/18/17 0333 01/19/17 0547 01/20/17 0358  WBC 10.2 8.0 8.3  HGB 10.3* 9.3* 9.1*  HCT 35.3* 31.8* 30.4*  PLT 249 210 177    Coag's  Recent Labs Lab 01/18/17 0927 01/19/17 0547 01/20/17 0358  INR 1.33 1.48 1.74    Sepsis Markers No results for input(s): LATICACIDVEN, PROCALCITON, O2SATVEN in the last 168 hours.  ABG  Recent Labs Lab 01/17/17 1015 01/20/17 0930  PHART 7.358 7.295*  PCO2ART 29.2* 40.8  PO2ART 145* 71.6*    Liver Enzymes  Recent Labs Lab 01/20/17 0358 01/20/17 0930 01/20/17 1509  AST  --  33  --   ALT  --  18  --   ALKPHOS  --  74  --   BILITOT  --  0.8  --   ALBUMIN 2.8* 2.8* 2.9*    Cardiac Enzymes No results for input(s): TROPONINI, PROBNP in the last 168  hours.  Glucose No results for input(s): GLUCAP in the last 168 hours.  Imaging Ct Head Wo Contrast  Addendum Date: 01/20/2017   ADDENDUM REPORT: 01/20/2017 16:18 ADDENDUM: Study discussed by telephone with Dr. Evette Doffing on the Internal Medicine Team On 01/20/2017 at 1545 hours. He advised that the patient has been hospitalized in the ICU and on heparin recently for a fib. This factor argues against the possibility of venous thrombosis, and in favor of a coagulopathy related bleed. No recent trauma. He plans a follow-up Brain MRI without and with contrast (I advised contrast even though the patient is recently on dialysis) when feasible. Electronically Signed   By: Genevie Ann M.D.   On: 01/20/2017 16:18   Result Date: 01/20/2017 CLINICAL DATA:  61 year old female with mental status changes since this morning  and increased confusion throughout the day. Dialysis patient. EXAM: CT HEAD WITHOUT CONTRAST TECHNIQUE: Contiguous axial images were obtained from the base of the skull through the vertex without intravenous contrast. COMPARISON:  Head CT without contrast 10/02/2009 FINDINGS: Brain: In 2010 the left frontal lobe appeared within normal limits. Now there is both a 3 cm oval area of hypodensity within the anterior left frontal lobe (series 3, image 17), and an adjacent mixed density rounded hemorrhage or mass like area of about 2.6 cm. There does appear to be a small volume of superimposed left parasagittal subdural hemorrhage up to 7 mm in thickness. There is not an area of vasogenic edema in the left frontal lobe. Outside of the mixed density abnormality in the anterior left frontal lobe gray and white matter differentiation appears normal throughout the brain. No significant intracranial mass effect. No ventriculomegaly. Normal basilar cisterns. Partially empty sella. No cortical encephalomalacia identified. Vascular: Aside from hyperdensity involving the left superior sagittal sinus at the area of presumed subdural blood there is no suspicious intracranial vascular hyperdensity. Skull: Hyperostosis of the calvarium. Small 6-7 mm oval lucent lesions in the left occipital bone (series 4, image 24) and left frontal bone (image 44) are identified. However, the left frontal bone lesion is unchanged from 2010, and the left occipital bone lesion was probably present at that time although much smaller. No definite destructive osseous lesion. Sinuses/Orbits: Visualized paranasal sinuses and mastoids are stable and well pneumatized. Other: No acute orbit or scalp soft tissue finding. IMPRESSION: 1. Unusual appearing mixed density mass like area along the surface of the anterior frontal lobe, with cytotoxic type edema as well as a rounded 2 cm area of hemorrhage. Apparent small volume superimposed nearby a left parafalcine  subdural blood. 2. Etiology is unclear at this point with top differential considerations including: trauma such as Hemorrhagic Contusion and SDH, Venous Infarct (superior cortical vein and/or short segment sagittal sinus thrombosis), and tumor. 3. Negative CT appearance of the brain elsewhere. 4. Recommend follow-up brain MRI, preferably without and with IV contrast, to further characterize. 5. Small lucent areas in the skull appear chronic and are probably benign - possibly hyperparathyroid or renal osteodystrophy related in the setting of chronic renal failure. Electronically Signed: By: Genevie Ann M.D. On: 01/20/2017 15:39     STUDIES:  CXR 4/1 > Cardiomegaly with mild persistent pulmonary edema similar to the previous exam CXR 4/9 > Cardiomegaly without acute disease  CT Head 4/11 > mixed density mass like area along the surface of the anterior frontal lobe, with cytotoxic type edema as well as a rounded 2 cm area of hemorrhage EEG 4/12 >> MRI Brain 4/12 >>  CULTURES: Blood 4/01 > E faecalis, vancomycin resistant  ANTIBIOTICS: Vancomycin 4/01 > 4/03 Daptomycin 4/03 > 4/05 Ampicillin 4/05 > Rocephin 4/05 >  SIGNIFICANT EVENTS: 3/18 Admit 3/21 start CRRT 3/27 DC cardioversion 3/29 To SDU 3/31 Fever 4/06 Failed repeat DC cardioversion 4/08 Transition off CRRT 4/12 New ICH   LINES/TUBES: Rt IJ HD cath 3/31 >> Lt IJ CVL 4/02 >>  DISCUSSION: 61 yo female admitted with CHF exacerbation complicated by renal failure and VRE bacteremia, on 4/12 found to have new ICH after cardioversion.   ASSESSMENT / PLAN:  PULMONARY A: Chronic Hypoxic/Hypercarbic respiratory failure in setting of COPD, OSA/OHS P:   Maintain Oxygenation >90 CPAP at [facility] Pulmonary Hygiene   CARDIOVASCULAR A:  Acute on Chronic combined CHF Hypotension  S/P Cardioversion 4/12 H/O Afib, HTN P:  Cardiac Monitoring  Heart Failure Following  Maintain MAP >62 Maintain systolic less than 947 and diastolic  less than 90 per neurology  Continue midodrine, amiodarone Hold coumadin and heparin gtt in setting of ICH  RENAL A:   Acute on Chronic Renal Failure with cardiorenal syndrome  P:   Nephrology following  Trend BMP Replace electrolytes as needed   GASTROINTESTINAL A:   Dysphagia  P:   Will need speech therapy evaluation  NPO PPI  HEMATOLOGIC A:   ICH P:  Hold anticoagulation  Trend Coag Studies  SCDs  INFECTIOUS A:   VRE Bacteremia  P:   Continue Antibiotics until 4/18 per ID 2 weeks after course completed will need blood cultures  Trend WBC and Fever curve   ENDOCRINE A:   No issues    P:   Trend glucose on BMP  NEUROLOGIC A:   Left Frontal ICH  Uremic Encephalopathy vs Subclinical seizures vs showering emboli  Deconditioning  P:   RASS goal: 0 Neurology following  Repeat CT head in AM MRI Brain when stable  EEG pending    FAMILY  - Updates: family updated at bedside   - Inter-disciplinary family meet or Palliative Care meeting due by: ongoing   CC Time: 32 minutes   Hayden Pedro, [place]  Pgr: [phone]  PCCM Pgr: [phone]

## 2017-01-20 NOTE — Progress Notes (Signed)
[place] Progress Note Patient Name: Paige Marshall DOB: July 18, 1956 MRN: [id number]   Date of Service  01/20/2017  HPI/Events of Note  Borderline blood pressure. Saturating well on 2 L/m via nasal cannula.   eICU Interventions  1. Normal saline 250 mL bolus over one hour 2. Continuing to monitor vitals per unit protocol      Intervention Category Intermediate Interventions: Hypotension - evaluation and management  Tera Partridge 01/20/2017, 7:53 PM

## 2017-01-20 NOTE — Significant Event (Signed)
Rapid Response Event Note  Overview: Time Called: 5692 Arrival Time: 6997 Event Type: Hypotension, Neurologic  Initial Focused Assessment: Patient with change in mental status since yesterday.  This am answering a few questions correctly then answering with the same word to any question.  Per family this is a change from yesterday afternoon about 5pm when they left.  Last night she was described as sleepy but able to answer simple orientation questions.   This afternoon she had a scheduled cardioversion. Post cardioversion her BP dropped to 50/30s.  I was called to assist with starting Levophed.  Before gtt was started her BP improved to her baseline. 80-90/60s.   I met patient while she was in radiology for a stat head CT scan.  Per neuro new ICH possible tumor. NIHSS 12  Unable to answer questions or follow commands, aphasia. Not moving bilateral lower extremities much.    BP 80-90s/60s, SR 70s,  RR 16  O2 sat 99% on 2L  Family at bedside when we returned to her room. Cardiology, Neurology, Renal, Teaching service resident and CCM at bedside to assess patient.  Transferred to [facility] via bed with O2 and heart monitor.   Interventions:  Plan of Care (if not transferred):  Event Summary: Name of Physician Notified: IMTS resident at 1500  Name of Consulting Physician Notified: Dr Aundra Dubin and Jonni Sanger NP at bedside,    Dr McQuaid/CCM notified at 1530   Outcome: Transferred (Comment)     Raliegh Ip

## 2017-01-20 NOTE — Procedures (Signed)
Electrical Cardioversion Procedure Note BRIANCA FORTENBERRY [id number] 1956-01-11  Procedure: Electrical Cardioversion Indications:  Atrial Fibrillation  Procedure Details Consent: Risks of procedure as well as the alternatives and risks of each were explained to the (patient/caregiver).  Consent for procedure obtained. Time Out: Verified patient identification, verified procedure, site/side was marked, verified correct patient position, special equipment/implants available, medications/allergies/relevent history reviewed, required imaging and test results available.  Performed  Patient placed on cardiac monitor, pulse oximetry, supplemental oxygen as necessary.  Sedation given: Propofol per anesthesiology Pacer pads placed anterior and posterior chest.  Cardioverted 3 time(s).  Cardioverted at [place]. Sternal pressure used the 2nd two cardioversion attempts.   Evaluation Findings: Post procedure EKG shows: NSR Complications: None Patient did tolerate procedure well.   Loralie Champagne 01/20/2017, 1:38 PM

## 2017-01-20 NOTE — Interval H&P Note (Signed)
History and Physical Interval Note:  01/20/2017 1:38 PM  Paige Marshall  has presented today for surgery, with the diagnosis of AFIB/FLUTTER  The various methods of treatment have been discussed with the patient and family. After consideration of risks, benefits and other options for treatment, the patient has consented to  Procedure(s): CARDIOVERSION (N/A) as a surgical intervention .  The patient's history has been reviewed, patient examined, no change in status, stable for surgery.  I have reviewed the patient's chart and labs.  Questions were answered to the patient's satisfaction.     Kampbell Holaway [employer]

## 2017-01-20 NOTE — Progress Notes (Signed)
RT spoke with RN regarding pt wearing CPAP tonight. Due to pt's mental status, we decided it was not in her best interest to wear CPAP at this time. RT will continue to monitor.

## 2017-01-20 NOTE — Progress Notes (Signed)
RT attempted to place arterial line, but was unsuccessful. RN aware.

## 2017-01-20 NOTE — H&P (View-Only) (Signed)
Advanced Heart Failure Rounding Note  PCP: Kerin Perna, NP  Primary Cardiologist: Dr. Gwenlyn Found   Subjective:    s/p failed DCCV 3/28 and 4/5. Remains in Afib/AFL in 110s. Amio switched to po 01/18/17 after > 16 g load of IV.     INR 1.74  Remains on heparin while INR trending up.   CVVHD stopped 01/16/17. Creatinine 3.66->4.24->4.99->5.54. No urine output.   Feels ok today, more dyspneic, endorses orthopnea. Wants to go home.    Objective:   Weight Range: 288 lb 12.8 oz (131 kg) Body mass index is 44.57 kg/m.   Vital Signs:   Temp:  [97.7 F (36.5 C)-98.7 F (37.1 C)] 97.8 F (36.6 C) (04/12 0343) Pulse Rate:  [63-106] 106 (04/12 0343) Resp:  [12-28] 15 (04/12 0028) BP: (78-99)/(49-89) 90/61 (04/12 0343) SpO2:  [94 %-100 %] 94 % (04/12 0343) Weight:  [288 lb 12.8 oz (131 kg)] 288 lb 12.8 oz (131 kg) (04/12 0632) Last BM Date: 01/18/17  Weight change: Filed Weights   01/18/17 0600 01/19/17 0500 01/20/17 5284  Weight: 265 lb (120.2 kg) 290 lb (131.5 kg) 288 lb 12.8 oz (131 kg)    Intake/Output:   Intake/Output Summary (Last 24 hours) at 01/20/17 0730 Last data filed at 01/20/17 0600  Gross per 24 hour  Intake              901 ml  Output                0 ml  Net              901 ml     Physical Exam: General: Obese female in NAD.    HEENT: Normal.  Neck: Supple, thick. L IJ in place. 7-8cm JVP  Cor: Irregularly irregular, tachy. PMI non-palpable.   Lungs: Diminished. Increased work of breathing.  Abdomen: Obese, soft, NT, ND, no HSM. No bruits or masses. +BS  Extremities: No cyanosis, clubbing, rash or edema. Warm.  Neuro: Alert & oriented x 3. Cranial nerves grossly intact. Moves all 4 extremities w/o difficulty. Affect pleasant    Telemetry: Reviewed personally, Afib 100-110s    Labs: CBC  Recent Labs  01/19/17 0547 01/20/17 0358  WBC 8.0 PENDING  HGB 9.3* 9.1*  HCT 31.8* 30.4*  MCV 91.4 90.2  PLT 210 PENDING   Basic Metabolic  Panel  Recent Labs  01/19/17 0547 01/19/17 1555 01/20/17 0358  NA 134* 133* 133*  K 4.3 4.7 4.6  CL 94* 95* 96*  CO2 22 20* 20*  GLUCOSE 88 97 89  BUN 20 24* 27*  CREATININE 4.24* 4.99* 5.54*  CALCIUM 10.4* 10.6* 10.2  MG 2.5*  --  2.5*  PHOS 4.2 5.5* 6.0*    BNP: BNP (last 3 results)  Recent Labs  09/18/16 1126 11/07/16 1633 12/26/16 1643  BNP 991.0* 490.4* 1,116.0*    Medications:     Scheduled Medications: . amiodarone  400 mg Oral BID  . ampicillin (OMNIPEN) IV  2 g Intravenous Q12H  . bisacodyl  10 mg Oral Daily  . cefTRIAXone (ROCEPHIN)  IV  2 g Intravenous Q12H  . Chlorhexidine Gluconate Cloth  6 each Topical Daily  . darbepoetin (ARANESP) injection - NON-DIALYSIS  150 mcg Subcutaneous Q Tue-1800  . fluticasone  1 spray Each Nare BID  . mouth rinse  15 mL Mouth Rinse BID  . midodrine  10 mg Oral TID WC  . multivitamin  1 tablet Oral QHS  .  senna-docusate  2 tablet Oral QHS  . sodium chloride flush  10-40 mL Intracatheter Q12H  . Warfarin - Pharmacist Dosing Inpatient   Does not apply q1800    Infusions: . heparin 2,400 Units/hr (01/20/17 0411)  . norepinephrine (LEVOPHED) Adult infusion Stopped (01/18/17 1739)    PRN Medications: acetaminophen, ALPRAZolam, diphenhydrAMINE-zinc acetate, heparin, ondansetron (ZOFRAN) IV, oxyCODONE, polyethylene glycol, promethazine, sodium chloride, sodium chloride flush, sorbitol   Assessment/Plan   1. Acute on chronic systolic/diastolic CHF - Down > 935 lbs with CVVHD.  - EF 25-30% on TEE 4/5 (down from 55% in 3/18) -> suspect tachy related from AF - No BB, ACE with low BP and ARF. No change.  2. Paroxysmal atrial fibrillation on Coumadin  -s/p DC-CV on 3/27 and failed again on 01/13/17.  - EP input on further rhythm control planning pending. - Continue heparin drip for now while INR trending up.  INR 1.74 this am. Dosing per pharm.  - Continue po amio 400 mg BID. 3. Acute on chronic kidney disease stage IV -  CVVHD stopped 01/16/17. No urine output.  -  Plan per renal, will need HD eventually as anuric.    - Continue midodrine 10 TID WC.  4. Obesity hypoventilation syndrome.  5. Hyperkalemia - Stable. K is 4.6  6. 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 No change to current plan.  7. Acute on chronic respiratory failure - Resolved with marked diuresis and weight loss.  8. Anemia - Hgb 9.1 - No overt bleeding continue to follow.  9. NSVT - Quiescent. Electrolytes stable.   10. Septic Shock- Blood CX--> VRE - Repeat BCx negative for 5 days. ID following.  Ceftriaxone/ampicillin to 4/18.  - TEE 01/13/17 no endocarditis   Will not add any afterload reducing medications with low BP.   Arbutus Leas, NP  7:30 AM  Advanced Heart Failure Team  Pager [phone] M-F 7am-4pm.  Please contact [facility] for night-coverage after hours (4p -7a ) and weekends on [email or web address]  Patient seen with NP, agree with the above note.   She is now off norepinephrine, BP stable on midodrine.    Creatinine rising, she remains anuric. She will need eventual HD, timing per renal service.  She does not appear significantly volume overloaded.   Antibiotics to 4/18 with VRE bacteremia.   HR remains in 100s-110s, atrial fibrillation. 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 She is now on amiodarone 400 mg bid. She is off norepinephrine and has had a large amiodarone load. Will plan repeat DCCV attempt today now that off pressor (will not need TEE, has been continuously anticoagulated).  Discussed risks/benefits with patient and husband, they agree to procedure. If no success, only option would be nodal ablation + BiV pacing. Will need to wait several more weeks if this is needed to get more distance out from recent VRE bacteremia.  Loralie Champagne 01/20/2017 9:51 AM

## 2017-01-20 NOTE — Progress Notes (Signed)
Jettie Booze NP with Heart Failure team notified that Primary Team and Renal Team wants central line removed and we would not have access to do CVPs. Sanda Linger

## 2017-01-20 NOTE — Progress Notes (Signed)
Asked to see pt s/p DCCV.   Remains in NSR.   Pt has been lethargic since sedation, and by history has been more confused all morning.    On my exam is alert to person only.  Grunts or says single words to questioning.  SBPs have improve to > 90 s/p 250 cc bolus of NS.  Has received 500 cc total since DCCV.  ? Asterixis on exam.   Pt on way to Head CT with new lethargy. Grip strength weak but equal.   Creatinine up to 5.5 this am.  Suspect pt will need dialysis once pressures more stable.  Very likely contributing to her encephalopathy.     Legrand Como [address]" [place], PA-C 01/20/2017 2:20 PM

## 2017-01-20 NOTE — Progress Notes (Addendum)
Paged to CT.   Pt remains hypotensive.  Readings as low as BP 60/40 via wrist cuff.  + 1-2+ radial pulse, has been as high as SBP 80-90.  Will start levophed at 4 mcg/kg/min and titrate as needed. Will re-order transfer to cardiac ICU.   Pt alert to person at this time.   Legrand Como [address] [place], [place] 01/20/2017 2:43 PM

## 2017-01-20 NOTE — Progress Notes (Signed)
 +   Head Bleed on CT.  Will continue transfer to ICU when bed available.  Pressures have stabilized without levophed now in 90s-100s.   Neurology to see.   Dr. Aundra Dubin aware.    Legrand Como [address] [place], PA-C 01/20/2017 3:05 PM

## 2017-01-20 NOTE — Progress Notes (Signed)
Visited patient briefly this afternoon, discussed her clinical status and newly discovered intracranial bleed with family. Ms. Bergey is awake but disoriented, repeating nonsensical responses to any questions (e.g. "I see you both"), moving all extremities, breathing comfortable and does not appear to be in pain. Her vitals were stable at the time of my visit - BP 96/54, HR 74 NSR, 98% on 2L [place] not on pressors currently. Discussed with PCCM and neurology, heparin has been stopped, per neurology need INR < 1.5, ordered Vitamin K 5 mg IV x1. They are obtaining EEG and MRI brain (non contrast due to ESRD) to evaluate for stroke or underlying tumor. Transferring to ICU. Family updated at bedside.

## 2017-01-20 NOTE — Progress Notes (Signed)
PT Cancellation Note  Patient Details Name: Paige Marshall MRN: [id number] DOB: 01-May-1956   Cancelled Treatment:    Reason Eval/Treat Not Completed: Medical issues which prohibited therapy (Pt with new bleed in brain per CT scan with awaiting transfer to ICU.  Supervising PT to reval and reassess.  )   Cristela Blue 01/20/2017, 5:06 PM [place]ook[place], PTA pager [phone]

## 2017-01-20 NOTE — Progress Notes (Addendum)
[name], female   DOB: 1955-12-25, 61 y.o.   MRN: [id number]   I was paged by the internal medicine teaching service resident to assist with assessing this patient who were concerned with her changes in mental status for possible need of dialysis. I reviewed her labs that are significant for a rise of her creatinine (5.0 to 5.5) however essentially unchanged BUN overnight (24 to 27). She was seen earlier by Dr. Florene Glen who did not find her to have any acute indications for dialysis.  Before I saw the patient, she had an emergent CT scan of the head that reveals left frontal intracranial hemorrhage status post cardioversion earlier. She was transiently on Levophed for hypotension. After initial plans for transfer to cardiac ICU, she was transferred back to her telemetry unit bed awaiting neurology consultation.  BP (!) 94/56   Pulse 71   Temp 98.4 F (36.9 C) (Oral)   Resp 18   Ht 5' 7.5" (1.715 m)   Wt 131 kg (288 lb 12.8 oz)   LMP 09/07/2011   SpO2 98%   BMI 44.57 kg/m  Gen.: Aphasic and without any obvious facial drift Respiratory: Poor inspiratory effort with decreased breath sounds over bases . Right IJ TDC in situ Cardiovascular: Pulse regular rhythm, S1 and S2 normal Extremities: Trace edema  Assessment/plan:  1. Altered mental status: Suspected to be related to acute intracranial hemorrhage plus/minus embolic event-likely needs MRI for further evaluation based on initial report by neurology. 2. Acute kidney injury on chronic kidney disease stage IV: Unclear whether she has progressed on to end-stage renal disease. Until recently was on CRRT given hemodynamic instability and now ongoing evaluation for intermittent hemodialysis. She does not have any acute hemodialysis indications at this time. 3. Hypotension: With recent intracranial hemorrhage and possible embolic event, agree with plans for transfer to ICU for pressor support to limit watershed injury.  Elmarie Shiley MD [facility]. Office # [phone] Pager # [phone] 3:40 PM

## 2017-01-20 NOTE — Progress Notes (Signed)
Called nurse to see if patient is able to have EEG; pt is being transferred to ICU; will do EEG in the AM as schedule permits.

## 2017-01-20 NOTE — Anesthesia Postprocedure Evaluation (Signed)
Anesthesia Post Note  Patient: Paige Marshall  Procedure(s) Performed: Procedure(s) (LRB): CARDIOVERSION (N/A)  Patient location during evaluation: PACU Anesthesia Type: General Level of consciousness: awake and alert Pain management: pain level controlled Vital Signs Assessment: post-procedure vital signs reviewed and stable Respiratory status: spontaneous breathing, nonlabored ventilation, respiratory function stable and patient connected to nasal cannula oxygen Cardiovascular status: blood pressure returned to baseline and stable Postop Assessment: no signs of nausea or vomiting Anesthetic complications: no       Last Vitals:  Vitals:   01/20/17 1409 01/20/17 1412  BP: (!) 84/51 (!) 94/56  Pulse: 69 71  Resp: 16 18  Temp:      Last Pain:  Vitals:   01/20/17 1412  TempSrc:   PainSc: 0-No pain                 Daisy Mcneel S

## 2017-01-20 NOTE — Transfer of Care (Signed)
Immediate Anesthesia Transfer of Care Note  Patient: Paige Marshall  Procedure(s) Performed: Procedure(s): CARDIOVERSION (N/A)  Patient Location: 3 west room 34 bedside   Anesthesia Type:General  Level of Consciousness: drowsy and responds to stimulation  Responds "yes" when asked her name but does not follow commands or answer questions.   Airway & Oxygen Therapy: Patient Spontanous Breathing and Patient connected to nasal cannula oxygen  Post-op Assessment: Report given to RN at bedside Kathlee Nations and Colletta [place]).  Remains hypotensive.  Dr. Aundra Dubin notified of hypotension.  Bolus ordered. Dr. Kalman Shan made aware.   Post vital signs: Reviewed  Last Vitals:  Vitals:   01/20/17 1132 01/20/17 1300  BP: 97/64 (!) 106/58  Pulse: (!) 101 (!) 108  Resp: 15 20  Temp: 36.9 C     Last Pain:  Vitals:   01/20/17 1300  TempSrc:   PainSc: 0-No pain      Patients Stated Pain Goal: 0 (07/18/11 1975)  Complications: No apparent anesthesia complications

## 2017-01-20 NOTE — Progress Notes (Signed)
ANTICOAGULATION CONSULT NOTE - Follow Up Consult  Pharmacy Consult for heparin>>warfarin Indication: atrial fibrillation  Labs:  Recent Labs  01/18/17 0333 01/18/17 0927  01/19/17 0547 01/19/17 1555 01/20/17 0358  HGB 10.3*  --   --  9.3*  --  9.1*  HCT 35.3*  --   --  31.8*  --  30.4*  PLT 249  --   --  210  --  177  LABPROT  --  16.6*  --  18.1*  --  20.5*  INR  --  1.33  --  1.48  --  1.74  HEPARINUNFRC 0.68  --   --  0.62  --  0.35  CREATININE 2.90*  --   < > 4.24* 4.99* 5.54*  < > = values in this interval not displayed.   Assessment: 61yo female now at goal on heparin after multiple rate adjustments; it has been difficult reaching and maintaining goal.  h/h low stable no bleeding.  Heparin drip 2400 units/hr HL 0.6> fell 0.35 overnight. Warfarin resumed 4/10, INR trending up slowly to 1.4> 1.78, will watch closely given robust amiodarone load.   PTA warfarin dose 5mg  daily  Goal of Therapy:  INR goal 2-3 Heparin level 0.3-0.7 units/ml   Plan:  Increase heparin infusion  2500 units/hr Daily INR, CBC and heparin level Warfarin 7.5mg  again tonight - maybe can restart home dose tomorrow  Bonnita Nasuti Pharm.D. CPP, BCPS Clinical Pharmacist [phone] 01/20/2017 12:34 PM

## 2017-01-20 NOTE — Progress Notes (Addendum)
Advanced Heart Failure Rounding Note  PCP: Kerin Perna, NP  Primary Cardiologist: Dr. Gwenlyn Found   Subjective:    s/p failed DCCV 3/28 and 4/5. Remains in Afib/AFL in 110s. Amio switched to po 01/18/17 after > 16 g load of IV.     INR 1.74  Remains on heparin while INR trending up.   CVVHD stopped 01/16/17. Creatinine 3.66->4.24->4.99->5.54. No urine output.   Feels ok today, more dyspneic, endorses orthopnea. Wants to go home.    Objective:   Weight Range: 288 lb 12.8 oz (131 kg) Body mass index is 44.57 kg/m.   Vital Signs:   Temp:  [97.7 F (36.5 C)-98.7 F (37.1 C)] 97.8 F (36.6 C) (04/12 0343) Pulse Rate:  [63-106] 106 (04/12 0343) Resp:  [12-28] 15 (04/12 0028) BP: (78-99)/(49-89) 90/61 (04/12 0343) SpO2:  [94 %-100 %] 94 % (04/12 0343) Weight:  [288 lb 12.8 oz (131 kg)] 288 lb 12.8 oz (131 kg) (04/12 0632) Last BM Date: 01/18/17  Weight change: Filed Weights   01/18/17 0600 01/19/17 0500 01/20/17 9518  Weight: 265 lb (120.2 kg) 290 lb (131.5 kg) 288 lb 12.8 oz (131 kg)    Intake/Output:   Intake/Output Summary (Last 24 hours) at 01/20/17 0730 Last data filed at 01/20/17 0600  Gross per 24 hour  Intake              901 ml  Output                0 ml  Net              901 ml     Physical Exam: General: Obese female in NAD.    HEENT: Normal.  Neck: Supple, thick. L IJ in place. 7-8cm JVP  Cor: Irregularly irregular, tachy. PMI non-palpable.   Lungs: Diminished. Increased work of breathing.  Abdomen: Obese, soft, NT, ND, no HSM. No bruits or masses. +BS  Extremities: No cyanosis, clubbing, rash or edema. Warm.  Neuro: Alert & oriented x 3. Cranial nerves grossly intact. Moves all 4 extremities w/o difficulty. Affect pleasant    Telemetry: Reviewed personally, Afib 100-110s    Labs: CBC  Recent Labs  01/19/17 0547 01/20/17 0358  WBC 8.0 PENDING  HGB 9.3* 9.1*  HCT 31.8* 30.4*  MCV 91.4 90.2  PLT 210 PENDING   Basic Metabolic  Panel  Recent Labs  01/19/17 0547 01/19/17 1555 01/20/17 0358  NA 134* 133* 133*  K 4.3 4.7 4.6  CL 94* 95* 96*  CO2 22 20* 20*  GLUCOSE 88 97 89  BUN 20 24* 27*  CREATININE 4.24* 4.99* 5.54*  CALCIUM 10.4* 10.6* 10.2  MG 2.5*  --  2.5*  PHOS 4.2 5.5* 6.0*    BNP: BNP (last 3 results)  Recent Labs  09/18/16 1126 11/07/16 1633 12/26/16 1643  BNP 991.0* 490.4* 1,116.0*    Medications:     Scheduled Medications: . amiodarone  400 mg Oral BID  . ampicillin (OMNIPEN) IV  2 g Intravenous Q12H  . bisacodyl  10 mg Oral Daily  . cefTRIAXone (ROCEPHIN)  IV  2 g Intravenous Q12H  . Chlorhexidine Gluconate Cloth  6 each Topical Daily  . darbepoetin (ARANESP) injection - NON-DIALYSIS  150 mcg Subcutaneous Q Tue-1800  . fluticasone  1 spray Each Nare BID  . mouth rinse  15 mL Mouth Rinse BID  . midodrine  10 mg Oral TID WC  . multivitamin  1 tablet Oral QHS  .  senna-docusate  2 tablet Oral QHS  . sodium chloride flush  10-40 mL Intracatheter Q12H  . Warfarin - Pharmacist Dosing Inpatient   Does not apply q1800    Infusions: . heparin 2,400 Units/hr (01/20/17 0411)  . norepinephrine (LEVOPHED) Adult infusion Stopped (01/18/17 1739)    PRN Medications: acetaminophen, ALPRAZolam, diphenhydrAMINE-zinc acetate, heparin, ondansetron (ZOFRAN) IV, oxyCODONE, polyethylene glycol, promethazine, sodium chloride, sodium chloride flush, sorbitol   Assessment/Plan   1. Acute on chronic systolic/diastolic CHF - Down > 416 lbs with CVVHD.  - EF 25-30% on TEE 4/5 (down from 55% in 3/18) -> suspect tachy related from AF - No BB, ACE with low BP and ARF. No change.  2. Paroxysmal atrial fibrillation on Coumadin  -s/p DC-CV on 3/27 and failed again on 01/13/17.  - EP input on further rhythm control planning pending. - Continue heparin drip for now while INR trending up.  INR 1.74 this am. Dosing per pharm.  - Continue po amio 400 mg BID. 3. Acute on chronic kidney disease stage IV -  CVVHD stopped 01/16/17. No urine output.  -  Plan per renal, will need HD eventually as anuric.    - Continue midodrine 10 TID WC.  4. Obesity hypoventilation syndrome.  5. Hyperkalemia - Stable. K is 4.6  6. 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 No change to current plan.  7. Acute on chronic respiratory failure - Resolved with marked diuresis and weight loss.  8. Anemia - Hgb 9.1 - No overt bleeding continue to follow.  9. NSVT - Quiescent. Electrolytes stable.   10. Septic Shock- Blood CX--> VRE - Repeat BCx negative for 5 days. ID following.  Ceftriaxone/ampicillin to 4/18.  - TEE 01/13/17 no endocarditis   Will not add any afterload reducing medications with low BP.   Arbutus Leas, NP  7:30 AM  Advanced Heart Failure Team  Pager [phone] M-F 7am-4pm.  Please contact [facility] for night-coverage after hours (4p -7a ) and weekends on [email or web address]  Patient seen with NP, agree with the above note.   She is now off norepinephrine, BP stable on midodrine.    Creatinine rising, she remains anuric. She will need eventual HD, timing per renal service.  She does not appear significantly volume overloaded.   Antibiotics to 4/18 with VRE bacteremia.   HR remains in 100s-110s, atrial fibrillation. 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 She is now on amiodarone 400 mg bid. She is off norepinephrine and has had a large amiodarone load. Will plan repeat DCCV attempt today now that off pressor (will not need TEE, has been continuously anticoagulated).  Discussed risks/benefits with patient and husband, they agree to procedure. If no success, only option would be nodal ablation + BiV pacing. Will need to wait several more weeks if this is needed to get more distance out from recent VRE bacteremia.  Loralie Champagne 01/20/2017 9:51 AM   She is more sedated this afternoon than early this morning.   ABG did not show hypercarbia but she had a metabolic acidosis likely due to renal failure.  I will arrange for a noncontrast head CT.   Loralie Champagne 01/20/2017 1:53 PM

## 2017-01-20 NOTE — Progress Notes (Signed)
Spoke with Dr. Tobias Alexander of neuro regarding MRI tonight vs. Waiting. Instructed to try, but if unable no sedation to be given. Also clarified regarding BP goals high and low. Ordered to keep below 570 systolic and regarding hypotension that is current, to defer to CCM. Will continue to monitor closely. Eleonore Chiquito RN 2 Heart

## 2017-01-20 NOTE — Progress Notes (Signed)
Rehab admissions - Please see rehab consult done today by Dr. Swartz recommending SNF placement.  Call me for questions.  #[phone] 

## 2017-01-21 ENCOUNTER — Inpatient Hospital Stay (HOSPITAL_COMMUNITY): Payer: Medicare Other

## 2017-01-21 ENCOUNTER — Encounter (HOSPITAL_COMMUNITY): Payer: Self-pay | Admitting: *Deleted

## 2017-01-21 DIAGNOSIS — I6200 Nontraumatic subdural hemorrhage, unspecified: Secondary | ICD-10-CM

## 2017-01-21 DIAGNOSIS — I629 Nontraumatic intracranial hemorrhage, unspecified: Secondary | ICD-10-CM

## 2017-01-21 LAB — PROTIME-INR
INR: 1.27
INR: 1.34
PROTHROMBIN TIME: 16 s — AB (ref 11.4–15.2)
Prothrombin Time: 16.7 seconds — ABNORMAL HIGH (ref 11.4–15.2)

## 2017-01-21 LAB — CBC
HCT: 30 % — ABNORMAL LOW (ref 36.0–46.0)
Hemoglobin: 8.9 g/dL — ABNORMAL LOW (ref 12.0–15.0)
MCH: 26.8 pg (ref 26.0–34.0)
MCHC: 29.7 g/dL — ABNORMAL LOW (ref 30.0–36.0)
MCV: 90.4 fL (ref 78.0–100.0)
PLATELETS: 153 10*3/uL (ref 150–400)
RBC: 3.32 MIL/uL — AB (ref 3.87–5.11)
RDW: 25.1 % — ABNORMAL HIGH (ref 11.5–15.5)
WBC: 7.6 10*3/uL (ref 4.0–10.5)

## 2017-01-21 LAB — RENAL FUNCTION PANEL
ALBUMIN: 2.8 g/dL — AB (ref 3.5–5.0)
ALBUMIN: 2.7 g/dL — AB (ref 3.5–5.0)
ANION GAP: 18 — AB (ref 5–15)
Anion gap: 19 — ABNORMAL HIGH (ref 5–15)
BUN: 34 mg/dL — ABNORMAL HIGH (ref 6–20)
BUN: 35 mg/dL — ABNORMAL HIGH (ref 6–20)
CALCIUM: 9.8 mg/dL (ref 8.9–10.3)
CALCIUM: 9.7 mg/dL (ref 8.9–10.3)
CO2: 17 mmol/L — ABNORMAL LOW (ref 22–32)
CO2: 17 mmol/L — AB (ref 22–32)
CREATININE: 7.09 mg/dL — AB (ref 0.44–1.00)
Chloride: 99 mmol/L — ABNORMAL LOW (ref 101–111)
Chloride: 97 mmol/L — ABNORMAL LOW (ref 101–111)
Creatinine, Ser: 6.72 mg/dL — ABNORMAL HIGH (ref 0.44–1.00)
GFR calc non Af Amer: 6 mL/min — ABNORMAL LOW (ref >60)
GFR calc non Af Amer: 6 mL/min — ABNORMAL LOW (ref >60)
GFR, EST AFRICAN AMERICAN: 7 mL/min — AB (ref >60)
GFR, EST AFRICAN AMERICAN: 7 mL/min — AB (ref >60)
GLUCOSE: 113 mg/dL — AB (ref 65–99)
Glucose, Bld: 91 mg/dL (ref 65–99)
PHOSPHORUS: 6.1 mg/dL — AB (ref 2.5–4.6)
Phosphorus: 6.2 mg/dL — ABNORMAL HIGH (ref 2.5–4.6)
Potassium: 4.7 mmol/L (ref 3.5–5.1)
Potassium: 4.4 mmol/L (ref 3.5–5.1)
SODIUM: 134 mmol/L — AB (ref 135–145)
SODIUM: 133 mmol/L — AB (ref 135–145)

## 2017-01-21 LAB — MAGNESIUM: Magnesium: 2.4 mg/dL (ref 1.7–2.4)

## 2017-01-21 LAB — POCT I-STAT 3, ART BLOOD GAS (G3+)
Acid-base deficit: 6 mmol/L — ABNORMAL HIGH (ref 0.0–2.0)
BICARBONATE: 20.7 mmol/L (ref 20.0–28.0)
O2 Saturation: 98 %
PH ART: 7.288 — AB (ref 7.350–7.450)
Patient temperature: 98.6
TCO2: 22 mmol/L (ref 0–100)
pCO2 arterial: 43.3 mmHg (ref 32.0–48.0)
pO2, Arterial: 120 mmHg — ABNORMAL HIGH (ref 83.0–108.0)

## 2017-01-21 MED ORDER — FENTANYL CITRATE (PF) 100 MCG/2ML IJ SOLN
50.0000 ug | Freq: Once | INTRAMUSCULAR | Status: AC
  Administered 2017-01-21: 50 ug via INTRAVENOUS
  Filled 2017-01-21: qty 2

## 2017-01-21 MED ORDER — ORAL CARE MOUTH RINSE
15.0000 mL | Freq: Four times a day (QID) | OROMUCOSAL | Status: DC
  Administered 2017-01-21 – 2017-01-22 (×4): 15 mL via OROMUCOSAL

## 2017-01-21 MED ORDER — FENTANYL 2500MCG IN NS 250ML (10MCG/ML) PREMIX INFUSION
25.0000 ug/h | INTRAVENOUS | Status: DC
  Administered 2017-01-21 – 2017-01-22 (×2): 50 ug/h via INTRAVENOUS
  Administered 2017-01-23 – 2017-01-24 (×2): 100 ug/h via INTRAVENOUS
  Administered 2017-01-25: 125 ug/h via INTRAVENOUS
  Administered 2017-01-26: 150 ug/h via INTRAVENOUS
  Filled 2017-01-21 (×6): qty 250

## 2017-01-21 MED ORDER — MIDAZOLAM HCL 2 MG/2ML IJ SOLN
INTRAMUSCULAR | Status: AC
  Administered 2017-01-21: 12:00:00
  Filled 2017-01-21: qty 2

## 2017-01-21 MED ORDER — SODIUM BICARBONATE 8.4 % IV SOLN
INTRAVENOUS | Status: AC
  Administered 2017-01-21: 50 meq
  Filled 2017-01-21: qty 100

## 2017-01-21 MED ORDER — NOREPINEPHRINE BITARTRATE 1 MG/ML IV SOLN
0.0000 ug/min | INTRAVENOUS | Status: DC
  Administered 2017-01-21: 20 ug/min via INTRAVENOUS
  Administered 2017-01-22: 32 ug/min via INTRAVENOUS
  Administered 2017-01-22: 28 ug/min via INTRAVENOUS
  Administered 2017-01-22: 25 ug/min via INTRAVENOUS
  Administered 2017-01-23: 17 ug/min via INTRAVENOUS
  Administered 2017-01-24: 24 ug/min via INTRAVENOUS
  Administered 2017-01-24: 22 ug/min via INTRAVENOUS
  Administered 2017-01-25: 25 ug/min via INTRAVENOUS
  Administered 2017-01-25 (×2): 30 ug/min via INTRAVENOUS
  Administered 2017-01-26 (×2): 40 ug/min via INTRAVENOUS
  Administered 2017-01-26: 32 ug/min via INTRAVENOUS
  Administered 2017-01-27: 42 ug/min via INTRAVENOUS
  Administered 2017-01-28: 12 ug/min via INTRAVENOUS
  Administered 2017-01-28: 5 ug/min via INTRAVENOUS
  Administered 2017-01-29: 10 ug/min via INTRAVENOUS
  Filled 2017-01-21 (×19): qty 16

## 2017-01-21 MED ORDER — FENTANYL CITRATE (PF) 100 MCG/2ML IJ SOLN
INTRAMUSCULAR | Status: AC
  Administered 2017-01-21: 12:00:00
  Filled 2017-01-21: qty 2

## 2017-01-21 MED ORDER — FENTANYL BOLUS VIA INFUSION
50.0000 ug | INTRAVENOUS | Status: DC | PRN
  Administered 2017-01-24 – 2017-01-25 (×4): 50 ug via INTRAVENOUS
  Filled 2017-01-21: qty 50

## 2017-01-21 MED ORDER — SODIUM CHLORIDE 0.9 % IV SOLN
0.0000 ug/min | INTRAVENOUS | Status: DC
  Administered 2017-01-21: 15 ug/min via INTRAVENOUS
  Filled 2017-01-21 (×3): qty 1

## 2017-01-21 MED ORDER — CHLORHEXIDINE GLUCONATE 0.12% ORAL RINSE (MEDLINE KIT)
15.0000 mL | Freq: Two times a day (BID) | OROMUCOSAL | Status: DC
Start: 2017-01-21 — End: 2017-01-22
  Administered 2017-01-21 – 2017-01-22 (×3): 15 mL via OROMUCOSAL

## 2017-01-21 MED ORDER — NOREPINEPHRINE BITARTRATE 1 MG/ML IV SOLN
0.0000 ug/min | INTRAVENOUS | Status: DC
  Administered 2017-01-21: 10 ug/min via INTRAVENOUS
  Administered 2017-01-21: 35 ug/min via INTRAVENOUS
  Filled 2017-01-21 (×3): qty 4

## 2017-01-21 MED ORDER — FAMOTIDINE IN NACL 20-0.9 MG/50ML-% IV SOLN
20.0000 mg | INTRAVENOUS | Status: DC
  Administered 2017-01-21 – 2017-01-23 (×3): 20 mg via INTRAVENOUS
  Filled 2017-01-21 (×3): qty 50

## 2017-01-21 MED ORDER — MIDAZOLAM HCL 2 MG/2ML IJ SOLN
2.0000 mg | INTRAMUSCULAR | Status: DC | PRN
  Administered 2017-01-22 – 2017-01-25 (×3): 2 mg via INTRAVENOUS
  Filled 2017-01-21 (×3): qty 2

## 2017-01-21 NOTE — Procedures (Signed)
Central Venous Catheter Insertion Procedure Note CHEVON FOMBY [id number] 1955/11/04  Procedure: Insertion of Central Venous Catheter Indications: Assessment of intravascular volume, Drug and/or fluid administration and Frequent blood sampling  Procedure Details Consent: Risks of procedure as well as the alternatives and risks of each were explained to the (patient/caregiver).  Consent for procedure obtained. Time Out: Verified patient identification, verified procedure, site/side was marked, verified correct patient position, special equipment/implants available, medications/allergies/relevent history reviewed, required imaging and test results available.  Performed  Maximum sterile technique was used including antiseptics, cap, gloves, gown, hand hygiene, mask and sheet. Skin prep: Chlorhexidine; local anesthetic administered A antimicrobial bonded/coated triple lumen catheter was placed in the left internal jugular vein using the Seldinger technique.  Evaluation Blood flow good Complications: No apparent complications Patient did tolerate procedure well. Chest X-ray ordered to verify placement.  CXR: pending.  Hayden Pedro, AG-ACNP [facility]  Pgr: [phone]  PCCM Pgr: [phone]

## 2017-01-21 NOTE — Progress Notes (Signed)
PULMONARY / CRITICAL CARE MEDICINE   Name: Paige Marshall MRN: [id number] DOB: [birth year]    ADMISSION DATE:  12/26/2016 CONSULTATION DATE:  01/20/2017  REFERRING MD:  Dr. Evette Doffing   CHIEF COMPLAINT:  AMS  Brief:   61 year old woman with poorly controlled OHS, chronic combined systolic and diastolic CHF, a-fib/a-flutter on chronic anticoagulation, hypertension, COPD, CKD Stage 3. Admitted to ED from [facility] on 3/18 for  acute decompensated CHF complicated by cardiorenal syndrome. During stay developed acute on chronic kidney disease requiring CRRT, now being followed by nephrology for PRN HD. Blood culture positive for VRE bacteremia being treated with ampicillin and ceftriaxone. Transferred to step-down on 4/11. On 4/12 noted to be more lethargic and aphasic. CT Head revealed mixed density mass like area along the surface of the anterior frontal lobe with cytotoxic edema as well as a rounded 2 cm hemorrhage.  SUBJECTIVE:  Decreased AMS overnight. Unable to obtained MRI due to movement.   VITAL SIGNS: BP (!) 71/45   Pulse 70   Temp 98.5 F (36.9 C) (Oral)   Resp 14   Ht 5' 7.5" (1.715 m)   Wt 131 kg (288 lb 12.8 oz)   LMP 09/07/2011   SpO2 (!) 86%   BMI 44.57 kg/m   HEMODYNAMICS:    VENTILATOR SETTINGS:    INTAKE / OUTPUT: I/O last 3 completed shifts: In: 1677.3 [I.V.:865.5; IV Piggyback:811.8] Out: 0   PHYSICAL EXAMINATION: General:  Adult female, no distress  Neuro:  Obtunded, does not follow commands  HEENT:  Normocephalic  Cardiovascular:  RRR, no MRG, NI S1/S2 Lungs:  Diminished breath sounds, no wheeze, non-labored  Abdomen:  Obese, active bowel sounds  Musculoskeletal:  No acute  Skin:  Warm, dry, intact   LABS:  BMET  Recent Labs Lab 01/20/17 0358 01/20/17 1509 01/21/17 0444  NA 133* 134* 134*  K 4.6 5.2* 4.7  CL 96* 97* 99*  CO2 20* 19* 17*  BUN 27* 31* 34*  CREATININE 5.54* 6.13* 6.72*  GLUCOSE 89 83 91    Electrolytes  Recent  Labs Lab 01/19/17 0547  01/20/17 0358 01/20/17 1509 01/21/17 0444 01/21/17 0445  CALCIUM 10.4*  < > 10.2 10.0 9.8  --   MG 2.5*  --  2.5*  --   --  2.4  PHOS 4.2  < > 6.0* 5.9* 6.1*  --   < > = values in this interval not displayed.  CBC  Recent Labs Lab 01/19/17 0547 01/20/17 0358 01/21/17 0445  WBC 8.0 8.3 7.6  HGB 9.3* 9.1* 8.9*  HCT 31.8* 30.4* 30.0*  PLT 210 177 153    Coag's  Recent Labs Lab 01/20/17 0358 01/20/17 2149 01/21/17 0400  INR 1.74 1.39 1.27    Sepsis Markers No results for input(s): LATICACIDVEN, PROCALCITON, O2SATVEN in the last 168 hours.  ABG  Recent Labs Lab 01/17/17 1015 01/20/17 0930  PHART 7.358 7.295*  PCO2ART 29.2* 40.8  PO2ART 145* 71.6*    Liver Enzymes  Recent Labs Lab 01/20/17 0930 01/20/17 1509 01/21/17 0444  AST 33  --   --   ALT 18  --   --   ALKPHOS 74  --   --   BILITOT 0.8  --   --   ALBUMIN 2.8* 2.9* 2.8*    Cardiac Enzymes No results for input(s): TROPONINI, PROBNP in the last 168 hours.  Glucose No results for input(s): GLUCAP in the last 168 hours.  Imaging Ct Head Wo Contrast  Addendum Date:  01/20/2017   ADDENDUM REPORT: 01/20/2017 16:18 ADDENDUM: Study discussed by telephone with Dr. Evette Doffing on the Internal Medicine Team On 01/20/2017 at 1545 hours. He advised that the patient has been hospitalized in the ICU and on heparin recently for a fib. This factor argues against the possibility of venous thrombosis, and in favor of a coagulopathy related bleed. No recent trauma. He plans a follow-up Brain MRI without and with contrast (I advised contrast even though the patient is recently on dialysis) when feasible. Electronically Signed   By: Genevie Ann M.D.   On: 01/20/2017 16:18   Result Date: 01/20/2017 CLINICAL DATA:  61 year old female with mental status changes since this morning and increased confusion throughout the day. Dialysis patient. EXAM: CT HEAD WITHOUT CONTRAST TECHNIQUE: Contiguous axial images  were obtained from the base of the skull through the vertex without intravenous contrast. COMPARISON:  Head CT without contrast 10/02/2009 FINDINGS: Brain: In 2010 the left frontal lobe appeared within normal limits. Now there is both a 3 cm oval area of hypodensity within the anterior left frontal lobe (series 3, image 17), and an adjacent mixed density rounded hemorrhage or mass like area of about 2.6 cm. There does appear to be a small volume of superimposed left parasagittal subdural hemorrhage up to 7 mm in thickness. There is not an area of vasogenic edema in the left frontal lobe. Outside of the mixed density abnormality in the anterior left frontal lobe gray and white matter differentiation appears normal throughout the brain. No significant intracranial mass effect. No ventriculomegaly. Normal basilar cisterns. Partially empty sella. No cortical encephalomalacia identified. Vascular: Aside from hyperdensity involving the left superior sagittal sinus at the area of presumed subdural blood there is no suspicious intracranial vascular hyperdensity. Skull: Hyperostosis of the calvarium. Small 6-7 mm oval lucent lesions in the left occipital bone (series 4, image 24) and left frontal bone (image 44) are identified. However, the left frontal bone lesion is unchanged from 2010, and the left occipital bone lesion was probably present at that time although much smaller. No definite destructive osseous lesion. Sinuses/Orbits: Visualized paranasal sinuses and mastoids are stable and well pneumatized. Other: No acute orbit or scalp soft tissue finding. IMPRESSION: 1. Unusual appearing mixed density mass like area along the surface of the anterior frontal lobe, with cytotoxic type edema as well as a rounded 2 cm area of hemorrhage. Apparent small volume superimposed nearby a left parafalcine subdural blood. 2. Etiology is unclear at this point with top differential considerations including: trauma such as Hemorrhagic  Contusion and SDH, Venous Infarct (superior cortical vein and/or short segment sagittal sinus thrombosis), and tumor. 3. Negative CT appearance of the brain elsewhere. 4. Recommend follow-up brain MRI, preferably without and with IV contrast, to further characterize. 5. Small lucent areas in the skull appear chronic and are probably benign - possibly hyperparathyroid or renal osteodystrophy related in the setting of chronic renal failure. Electronically Signed: By: Genevie Ann M.D. On: 01/20/2017 15:39   STUDIES: CXR 4/1 > Cardiomegaly with mild persistent pulmonary edema similar to the previous exam CXR 4/9 >Cardiomegaly without acute disease  CT Head 4/11 > mixed density mass like area along the surface of the anterior frontal lobe, with cytotoxic type edema as well as a rounded 2 cm area of hemorrhage EEG 4/13 >> CT Head 4/13 > Left frontal mixed attenuation collection, hyperdense acute hemorrhage posteriorly, max thickness between 13-15 mm, acute left parafalcine subdural hemorrhage, 2 mm left to right shift  MRI  Brain 4/13 >>  CULTURES: Blood 4/01 > E faecalis, vancomycin resistant  ANTIBIOTICS: Vancomycin 4/01 > 4/03 Daptomycin 4/03 > 4/05 Ampicillin 4/05 > Rocephin 4/05 >  SIGNIFICANT EVENTS: 3/18 Admit 3/21 start CRRT 3/27 DC cardioversion 3/29 To SDU 3/31 Fever 4/06 Failed repeat DC cardioversion 4/08 Transition off CRRT 4/12 New ICH   LINES/TUBES: Rt IJ HD cath 3/31 >> Lt IJ CVL 4/02 > 4/13  DISCUSSION: 61 yo female admitted with CHF exacerbation complicated by renal failure and VRE bacteremia, on 4/12 found to have new ICH after cardioversion  ASSESSMENT / PLAN:  PULMONARY A: Chronic Hypoxic/Hypercarbic resp failure in setting of COPD, OSA/OHS P:   Maintain Oxygenation >90 CPAP at [facility] Pulmonary Hygiene  High risk for intubation   CARDIOVASCULAR A:  Acute on Chronic Combined CHF Hypotension  Afib S/P cardioversion 4/12 H/O HTN P:  Cardiac Monitoring   Per Heart Failure  Maintain MAP >65  Levophed Gtt, per neurology systolic <196 and diastolic <22 Continue midodrine, amiodarone  Hold coumadin/heparin in setting of ICH  RENAL A:   Acute on Chronic renal failure with cardiorenal syndrome  P:   Neprology following  Trend BMP Replace electrolyes as needed   GASTROINTESTINAL A:   Dysphagia  P:   Will need speech therapy evaluation  NPO PPI   HEMATOLOGIC A:   ICH P:  Hold anticoagulation  Trend Coag Studies  SCDS   INFECTIOUS A:   VRE Bacteremia  P:   Continue Antibiotics until 4/18 per ID 2 weeks after course completed will need blood cultures  Trend WBC and Fever curve    ENDOCRINE A:   No issues    P:   Trend Glucose on BMP  NEUROLOGIC A:   Left Frontal ICH  Uremic Encephalopathy vs Subclinical seizures vs showering emboli  Deconditioning  P:   RASS goal: 0 Neurology following  Repeat CT head now  MRI Brain when stable  EEG pending    FAMILY  - Updates: family updated at bedside   - Inter-disciplinary family meet or Palliative Care meeting due by: ongoing     Hayden Pedro, AG-ACNP [facility]  Pgr: [phone]  PCCM Pgr: [phone]

## 2017-01-21 NOTE — Progress Notes (Signed)
STROKE TEAM PROGRESS NOTE   SUBJECTIVE (INTERVAL HISTORY) No family is at the bedside.  She was just intubated and had a central line placed. Exam limited due to sedation and paralytics. MRI brain without contrast done, did not add additional information from CT. Need MRA and MRV to further evaluation. INR corrected with VitK and Kcentra   OBJECTIVE Temp:  [97.5 F (36.4 C)-98.5 F (36.9 C)] 97.5 F (36.4 C) (04/13 0840) Pulse Rate:  [63-87] 68 (04/13 1700) Cardiac Rhythm: Normal sinus rhythm (04/13 0800) Resp:  [12-21] 16 (04/13 1700) BP: (71-143)/(43-105) 71/45 (04/13 0045) SpO2:  [85 %-100 %] 99 % (04/13 1700) Arterial Line BP: (78-128)/(46-82) 102/66 (04/13 1700) FiO2 (%):  [70 %-100 %] 70 % (04/13 1624) Weight:  [285 lb (129.3 kg)-288 lb 12.8 oz (131 kg)] 288 lb 12.8 oz (131 kg) (04/13 0530)  CBC:  Recent Labs Lab 01/20/17 0358 01/21/17 0445  WBC 8.3 7.6  HGB 9.1* 8.9*  HCT 30.4* 30.0*  MCV 90.2 90.4  PLT 177 765    Basic Metabolic Panel:  Recent Labs Lab 01/20/17 0358 01/20/17 1509 01/21/17 0444 01/21/17 0445  NA 133* 134* 134*  --   K 4.6 5.2* 4.7  --   CL 96* 97* 99*  --   CO2 20* 19* 17*  --   GLUCOSE 89 83 91  --   BUN 27* 31* 34*  --   CREATININE 5.54* 6.13* 6.72*  --   CALCIUM 10.2 10.0 9.8  --   MG 2.5*  --   --  2.4  PHOS 6.0* 5.9* 6.1*  --     Lipid Panel:    Component Value Date/Time   CHOL 167 01/15/2015 0346   TRIG 194 (H) 01/15/2015 0346   HDL 35 (L) 01/15/2015 0346   CHOLHDL 4.8 01/15/2015 0346   VLDL 39 01/15/2015 0346   LDLCALC 93 01/15/2015 0346   HgbA1c:  Lab Results  Component Value Date   HGBA1C  10/02/2009    5.6 (NOTE) The ADA recommends the following therapeutic goal for glycemic control related to Hgb A1c measurement: Goal of therapy: <6.5 Hgb A1c  Reference: American Diabetes Association: Clinical Practice Recommendations 2010, Diabetes Care, 2010, 33: (Suppl  1).   Urine Drug Screen:    Component Value Date/Time    LABOPIA NONE DETECTED 11/15/2009 2107   COCAINSCRNUR NONE DETECTED 11/15/2009 2107   LABBENZ NONE DETECTED 11/15/2009 2107   AMPHETMU NONE DETECTED 11/15/2009 2107   THCU NONE DETECTED 11/15/2009 2107   LABBARB  11/15/2009 2107    NONE DETECTED        DRUG SCREEN FOR MEDICAL PURPOSES ONLY.  IF CONFIRMATION IS NEEDED FOR ANY PURPOSE, NOTIFY LAB WITHIN 5 DAYS.        LOWEST DETECTABLE LIMITS FOR URINE DRUG SCREEN Drug Class       Cutoff (ng/mL) Amphetamine      1000 Barbiturate      200 Benzodiazepine   465 Tricyclics       035 Opiates          300 Cocaine          300 THC              50    Alcohol Level No results found for: [place] I have personally reviewed the radiological images below and agree with the radiology interpretations.  Dg Chest 1 View  Result Date: 01/21/2017 CLINICAL DATA:  Status post intubation today. EXAM: CHEST 1 VIEW COMPARISON:  Single-view of the chest earlier today. FINDINGS: New NG tube courses into the stomach and below the inferior margin of film. New endotracheal tube is in place with the tip at the level of clavicular heads in good position well above the carina. Right IJ approach dialysis catheter is again seen. Cardiomegaly and interstitial edema are unchanged. Atherosclerosis is noted. IMPRESSION: ETT and NG tube projecting good position. Cardiomegaly and mild interstitial edema. Electronically Signed   By: Inge Rise M.D.   On: 01/21/2017 12:01   Ct Head Wo Contrast  Result Date: 01/21/2017 CLINICAL DATA:  Slurred speech. Recent hospitalization for atrial fibrillation, on heparin. Altered mental status. EXAM: CT HEAD WITHOUT CONTRAST TECHNIQUE: Contiguous axial images were obtained from the base of the skull through the vertex without intravenous contrast. COMPARISON:  01/20/2017. FINDINGS: Brain: Redemonstrated is a LEFT frontal mixed attenuation collection, biconvex on coronal imaging, with low attenuation fluid anteriorly, and  hyperdense acute hemorrhage posteriorly. Maximum thickness of between 13 and 15 mm. Acute LEFT parafalcine subdural hemorrhage redemonstrated as well, small volume. No clear areas of cerebral infarction or vasogenic edema. 2 mm of LEFT-to-RIGHT shift. Vascular: No hyperdense vessel or unexpected calcification. Skull: Normal. Negative for fracture or focal lesion. Sinuses/Orbits: No acute finding. Other: None. IMPRESSION: Stable LEFT frontal mixed attenuation collections, as described above. 2 mm LEFT-to-RIGHT shift. I believe the collections could both be extra-axial, representing subdural/epidural hematoma/hygroma. Continued surveillance warranted. Patient could not cooperate for MRI. If clinically indicated, repeat could be performed with sedation. Electronically Signed   By: Staci Righter M.D.   On: 01/21/2017 10:18   Ct Head Wo Contrast  Addendum Date: 01/20/2017   ADDENDUM REPORT: 01/20/2017 16:18 ADDENDUM: Study discussed by telephone with Dr. Evette Doffing on the Internal Medicine Team On 01/20/2017 at 1545 hours. He advised that the patient has been hospitalized in the ICU and on heparin recently for a fib. This factor argues against the possibility of venous thrombosis, and in favor of a coagulopathy related bleed. No recent trauma. He plans a follow-up Brain MRI without and with contrast (I advised contrast even though the patient is recently on dialysis) when feasible. Electronically Signed   By: Genevie Ann M.D.   On: 01/20/2017 16:18   Result Date: 01/20/2017 CLINICAL DATA:  61 year old female with mental status changes since this morning and increased confusion throughout the day. Dialysis patient. EXAM: CT HEAD WITHOUT CONTRAST TECHNIQUE: Contiguous axial images were obtained from the base of the skull through the vertex without intravenous contrast. COMPARISON:  Head CT without contrast 10/02/2009 FINDINGS: Brain: In 2010 the left frontal lobe appeared within normal limits. Now there is both a 3 cm oval  area of hypodensity within the anterior left frontal lobe (series 3, image 17), and an adjacent mixed density rounded hemorrhage or mass like area of about 2.6 cm. There does appear to be a small volume of superimposed left parasagittal subdural hemorrhage up to 7 mm in thickness. There is not an area of vasogenic edema in the left frontal lobe. Outside of the mixed density abnormality in the anterior left frontal lobe gray and white matter differentiation appears normal throughout the brain. No significant intracranial mass effect. No ventriculomegaly. Normal basilar cisterns. Partially empty sella. No cortical encephalomalacia identified. Vascular: Aside from hyperdensity involving the left superior sagittal sinus at the area of presumed subdural blood there is no suspicious intracranial vascular hyperdensity. Skull: Hyperostosis of the calvarium. Small 6-7 mm oval lucent lesions in the left occipital bone (series 4,  image 24) and left frontal bone (image 44) are identified. However, the left frontal bone lesion is unchanged from 2010, and the left occipital bone lesion was probably present at that time although much smaller. No definite destructive osseous lesion. Sinuses/Orbits: Visualized paranasal sinuses and mastoids are stable and well pneumatized. Other: No acute orbit or scalp soft tissue finding. IMPRESSION: 1. Unusual appearing mixed density mass like area along the surface of the anterior frontal lobe, with cytotoxic type edema as well as a rounded 2 cm area of hemorrhage. Apparent small volume superimposed nearby a left parafalcine subdural blood. 2. Etiology is unclear at this point with top differential considerations including: trauma such as Hemorrhagic Contusion and SDH, Venous Infarct (superior cortical vein and/or short segment sagittal sinus thrombosis), and tumor. 3. Negative CT appearance of the brain elsewhere. 4. Recommend follow-up brain MRI, preferably without and with IV contrast, to  further characterize. 5. Small lucent areas in the skull appear chronic and are probably benign - possibly hyperparathyroid or renal osteodystrophy related in the setting of chronic renal failure. Electronically Signed: By: Genevie Ann M.D. On: 01/20/2017 15:39   Mr Brain Wo Contrast  Result Date: 01/21/2017 CLINICAL DATA:  Altered mental status.  Intracranial hemorrhage. EXAM: MRI HEAD WITHOUT CONTRAST TECHNIQUE: Multiplanar, multiecho pulse sequences of the brain and surrounding structures were obtained without intravenous contrast. COMPARISON:  Head CT 01/21/2017 FINDINGS: The examination had to be discontinued prior to completion, and axial T1 and coronal T2 sequences were not obtained. Brain: Left-sided subdural hematoma is greatest over the frontal convexity and measures up to 12 mm in thickness, unchanged from the CT earlier today. The more anterior component is T1, T2, and FLAIR hyperintense and simpler in appearance, corresponding to the low-density fluid on CT. At the posterior aspect of this are more complex appearing blood products, corresponding to the hyperdense hemorrhage on CT. Minimal subdural hematoma extends posteriorly over the left cerebral convexity as well as into the middle cranial fossa over the temporal lobe and into the interhemispheric fissure. There is also trace subdural hematoma over the anterior right frontal lobe. The left-sided hematoma results in mild left cerebral hemispheric sulcal effacement and minimal rightward midline shift of 3 mm, unchanged. There is no acute infarct. No significant cerebral white matter disease or brain edema is identified. Cerebral volume is within normal limits for age. Vascular: Major intracranial vascular flow voids are preserved. Skull and upper cervical spine: Decreased bone marrow signal intensity likely reflects known underlying anemia. Sinuses/Orbits: Unremarkable orbits. Fluid in the nasopharynx. Clear paranasal sinuses. Trace bilateral mastoid  fluid. Other: None. IMPRESSION: 1. Slightly incomplete noncontrast brain MRI. 2. Left-sided subdural hematoma, unchanged from earlier CT. Trace rightward midline shift. 3. Trace right frontal subdural hematoma. 4. No acute infarct. Electronically Signed   By: Logan Bores M.D.   On: 01/21/2017 15:53   Dg Chest Port 1 View  Result Date: 01/21/2017 CLINICAL DATA:  Central line placement EXAM: PORTABLE CHEST 1 VIEW COMPARISON:  Portable exam 1300 hours compared 01/21/2017 FINDINGS: Tip of endotracheal tube projects 4.1 cm above carina. Nasogastric tube extends into stomach. RIGHT jugular dual-lumen catheter projects over cavoatrial junction. New RIGHT jugular central venous catheter with tip projecting over SVC. EKG leads project over chest. Enlargement of cardiac silhouette with pulmonary vascular congestion. Atherosclerotic calcification aorta. Slightly improved interstitial edema. No pleural effusion or pneumothorax. IMPRESSION: No pneumothorax following LEFT jugular line placement. Slightly improved pulmonary edema. Electronically Signed   By: Crist Infante.D.  On: 01/21/2017 13:16   Dg Chest Port 1 View  Result Date: 01/21/2017 CLINICAL DATA:  Shortness of Breath EXAM: PORTABLE CHEST 1 VIEW COMPARISON:  01/17/2017 FINDINGS: Cardiac shadow remains enlarged. Dialysis catheter is again seen. The left jugular central line is been removed. The lungs are well aerated bilaterally. No focal infiltrate or sizable effusion is seen. Mild fullness of the pulmonary vasculature is noted likely related to a degree of volume overload. No bony abnormality is seen. IMPRESSION: Mild volume overload likely related to the underlying Clinical state. No new focal abnormality is seen. Electronically Signed   By: Inez Catalina M.D.   On: 01/21/2017 10:03   MRA and MRV pending  EEG pending   PHYSICAL EXAM  Temp:  [97.5 F (36.4 C)-98.5 F (36.9 C)] 97.5 F (36.4 C) (04/13 0840) Pulse Rate:  [63-87] 68 (04/13  1700) Resp:  [12-21] 16 (04/13 1700) BP: (71-143)/(43-105) 71/45 (04/13 0045) SpO2:  [85 %-100 %] 99 % (04/13 1700) Arterial Line BP: (78-128)/(46-82) 102/66 (04/13 1700) FiO2 (%):  [70 %-100 %] 70 % (04/13 1624) Weight:  [285 lb (129.3 kg)-288 lb 12.8 oz (131 kg)] 288 lb 12.8 oz (131 kg) (04/13 0530)  General - obese, well developed, intubated on sedation.  Ophthalmologic - Fundi not visualized  Cardiovascular - Regular rate and rhythm.  Neuro - intubated on sedation, slight open eyes on voice and pain, not following commands. With eyes open, not blinking to visual threat, not tracking. PERRL, doll's eye present, positive corneal and gag. Resume over the vent. On pain stimulation no movement on both are per extremities, however mild withdrawal on bilateral lower extremities. DTR diminished and no Babinski. Sensation, coordination, and gait not tested.    ASSESSMENT/PLAN [name] is a 61 y.o. female with history ofA. fib on Coumadin, stroke, CHF, CK D, HTN, HLD, obesity, cardiomyopathy presenting with Pulmonary edema, respiratory distress and fluid overload. Received cardioversion, however postprocedure found to have AMS and aphasia. Was on Coumadin with heparin IV bridge, INR 1.74. CT found to have left frontal ICH and parafalcine SDH.   ICH and SDH:  Left frontal small ICH with parafalcine small SDH, etiology not clear. Could be due to coagulopathy with coumadin INR 1.74 and heparin IV bridge but pt BP was low. Could be due to AVM, aneurysm or CVST, will do MRA and MRV.   MRI and CT head - left frontal small ICH with parafalcine small SDH, left frontal hygroma  MRA and MRV pending  2D Echo  EF 35-40%  EEG pending  SCDs for VTE prophylaxis  Diet NPO time specified  heparin IV and warfarin daily discontinued and INR reversed, now on No antithrombotic  Ongoing aggressive stroke risk factor management  Therapy recommendations:  Pending   Disposition:   Pending  Atrial fibrillation   Was on coumadin with heparin bridge  INR 1.74, reversed with VitK and kcentra  Hold off anticoagulation for now  May consider resume anticoagulation once blood absorbed and pt stabilized  On amiodarone IV  AMS  The degree of AMS cannot be explained by CT/MRI findings  Multifactorial, likely due to worsening renal function, dress free distress, and sepsis  Nephrology and ID on board  On ampicillin, Rocephin,  EEG pending  Ammonia level slightly elevated at 40  Hypotension on Midrin, Levophed, and Neo BP goal < 140 due to SDH  Other Active Problems  AKI on CKD with worsening Cre  CHF  Cardiomyopathy  Obesity  OSA  Hospital day # 26  This patient is critically ill due to left frontal and parafalcine SDH/hygroma, AMS, afib not on AC and at significant risk of neurological worsening, death form hematoma expansion, brain herniation, stroke, heart failure, sepsis, hypotension. This patient's care requires constant monitoring of vital signs, hemodynamics, respiratory and cardiac monitoring, review of multiple databases, neurological assessment, discussion with family, other specialists and medical decision making of high complexity. I spent 45 minutes of neurocritical care time in the care of this patient.  Rosalin Hawking, MD PhD Stroke Neurology 01/21/2017 7:04 PM   To contact Stroke Continuity provider, please refer to [email or web address] After hours, contact General Neurology

## 2017-01-21 NOTE — Procedures (Signed)
Intubation Procedure Note Paige Marshall [id number] 25-Aug-1956  Procedure: Intubation Indications: Airway protection and maintenance  Procedure Details Consent: Risks of procedure as well as the alternatives and risks of each were explained to the (patient/caregiver).  Consent for procedure obtained. Time Out: Verified patient identification, verified procedure, site/side was marked, verified correct patient position, special equipment/implants available, medications/allergies/relevent history reviewed, required imaging and test results available.  Performed  Maximum sterile technique was used including gloves, hand hygiene and mask.  3    Evaluation Hemodynamic Status: BP stable throughout; O2 sats: transiently fell during during procedure, after bagging stabilized  Patient's Current Condition: stable Complications: No apparent complications Patient did tolerate procedure well. Chest X-ray ordered to verify placement.  CXR: pending.   Hayden Pedro, AG-ACNP [facility]  Pgr: [phone]  PCCM Pgr: [phone]

## 2017-01-21 NOTE — Progress Notes (Signed)
Advanced Heart Failure Rounding Note  PCP: Kerin Perna, NP  Primary Cardiologist: Dr. Gwenlyn Found   Subjective:    s/p failed DCCV 3/28 and 4/5. Remains in Afib/AFL in 110s. Amio switched to po 01/18/17 after > 16 g load of IV.     In am of 01/20/17 was thought to be mildly confused from baseline, thought to be 2/2 worsening renal function.  Pt underwent DCCV with successful return to NSR.   Pt noted to be difficult to rouse from sedation, and with on-going lethargy and "garbled speech" CT head ordered which showed left frontal intracranial hemorrhage.  Heparin stopped.    INR 1.27 this am.  Heparin and coumadin stopped yesterday after CT head showed bleed.   MRI head unable to be obtained due to agitation.  Repeat CT has not been performed yet, but nurse aware.   Responding to pain only this am. Pressures in 80s currently on phenylephrine up to 40 mcg/min. Significantly more lethargic than yesterday.   CVVHD stopped 01/16/17. Creatinine 3.66->4.24->4.99->5.54 -> 6.72. No urine output recorded.  HCO3 17.    Objective:   Weight Range: 288 lb 12.8 oz (131 kg) Body mass index is 44.57 kg/m.   Vital Signs:   Temp:  [97.9 F (36.6 C)-98.5 F (36.9 C)] 98.5 F (36.9 C) (04/13 0300) Pulse Rate:  [69-108] 70 (04/13 0730) Resp:  [12-30] 14 (04/13 0730) BP: (71-143)/(43-105) 71/45 (04/13 0045) SpO2:  [85 %-100 %] 86 % (04/13 0730) Arterial Line BP: (79-107)/(46-61) 101/54 (04/13 0730) Weight:  [285 lb (129.3 kg)-288 lb 12.8 oz (131 kg)] 288 lb 12.8 oz (131 kg) (04/13 0530) Last BM Date: 01/18/17  Weight change: Filed Weights   01/20/17 0947 01/21/17 0315 01/21/17 0530  Weight: 288 lb 12.8 oz (131 kg) 285 lb (129.3 kg) 288 lb 12.8 oz (131 kg)    Intake/Output:   Intake/Output Summary (Last 24 hours) at 01/21/17 0825 Last data filed at 01/21/17 0600  Gross per 24 hour  Intake          1165.32 ml  Output                0 ml  Net          1165.32 ml     Physical  Exam: General: Obese AA female.  Lethargic.     HEENT: Normocephalic, atraumatic.  Neck: Supple, thick. LIJ in place. JVP 7-8   Cor: Distant.  Regular. PMI non-palpable.  No M/G/R appreciated.    Lungs: Diminished throughout.   Abdomen: Obese, ND, no HSM. No bruits or masses. +BS   Extremities: No cyanosis, clubbing, rash or edema. Cool to the touch.   Neuro: Responds to painful stimuli only this am. Moving her lower legs around, but not on command.   Telemetry: Personally reviewed, NSR 60-70s     Labs: CBC  Recent Labs  01/20/17 0358 01/21/17 0445  WBC 8.3 7.6  HGB 9.1* 8.9*  HCT 30.4* 30.0*  MCV 90.2 90.4  PLT 177 096   Basic Metabolic Panel  Recent Labs  01/20/17 0358 01/20/17 1509 01/21/17 0444 01/21/17 0445  NA 133* 134* 134*  --   K 4.6 5.2* 4.7  --   CL 96* 97* 99*  --   CO2 20* 19* 17*  --   GLUCOSE 89 83 91  --   BUN 27* 31* 34*  --   CREATININE 5.54* 6.13* 6.72*  --   CALCIUM 10.2 10.0 9.8  --  MG 2.5*  --   --  2.4  PHOS 6.0* 5.9* 6.1*  --     BNP: BNP (last 3 results)  Recent Labs  09/18/16 1126 11/07/16 1633 12/26/16 1643  BNP 991.0* 490.4* 1,116.0*    Medications:     Scheduled Medications: . ampicillin (OMNIPEN) IV  2 g Intravenous Q12H  . cefTRIAXone (ROCEPHIN)  IV  2 g Intravenous Q12H  . Chlorhexidine Gluconate Cloth  6 each Topical Daily  . darbepoetin (ARANESP) injection - NON-DIALYSIS  150 mcg Subcutaneous Q Tue-1800  . fluticasone  1 spray Each Nare BID  . mouth rinse  15 mL Mouth Rinse BID  . midodrine  10 mg Oral TID WC    Infusions: . amiodarone 30 mg/hr (01/21/17 0500)  . phenylephrine (NEO-SYNEPHRINE) Adult infusion 20 mcg/min (01/21/17 0730)    PRN Medications: Place/Maintain arterial line **AND** sodium chloride, acetaminophen, diphenhydrAMINE-zinc acetate, senna-docusate, sodium chloride  Assessment/Plan   1. Acute on chronic systolic/diastolic CHF: Down > 956 lbs with CVVHD.  EF 25-30% on TEE 4/5 (down from  55% in 3/18) -> may be tachy-mediated CMP from AF.  - No BB, ACE/ARB/ARNI with low BP,  AKI.     - Watching pressure closely now with frontal intracranial hemorrhage (don't want too high or too low) => transition from phenylephrine to norephinephrine with low BP and cardiomyopathy. Goal SBP > 90. 2. Left frontal intracranial hemorrhage: Some changes in mental status noted as early as 4/11 evening. Found on head CT 01/20/17. Needs repeat imaging stat with worsening lethargy. Worry that it has continued to spread. + Aphasia. Neurology following.  Unable to get MRI last night due to too much movement.  - Stat repeat head CT.  3. Paroxysmal atrial fibrillation: s/p DC-CV on 3/27 and failed again on 01/13/17. s/p successful DCCV to NSR on 01/20/17.  Remains in NSR, off anticoagulation with CNS bleed.  - Placed back on Amiodarone gtt overnight. Continue for now.  4. AKI on CKD:  CVVHD stopped 01/16/17.  She is anuric.  Metabolic acidosis with HCO3 17.  As acidosis proceeds, it is going to be harder and harder to keep her BP up.  Suspect we will need CVVH for solute removal.  - Awaiting nephrology input.   5. Obesity hypoventilation syndrome.  6. Hyperkalemia:  Stable. K is 4.7. 7. 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 No change.   8. Acute on chronic respiratory failure: Resolved with diuresis. I think she will need to be intubated for airway control. 9. Anemia: Hgb 8.9 from 9.1 yesterday. Anticoags held with head bleed.  10. NSVT: Quiescent. Electrolytes stable.  No change.  11. Septic Shock- Blood CX--> VRE - Repeat BCx negative for 5 days. ID following.  Ceftriaxone/ampicillin to 4/18.  - TEE 01/13/17 without endocarditis. No change.   Shirley Friar, PA-C  8:25 AM   Advanced Heart Failure Team  Pager [phone] M-F 7am-4pm.  Please contact [facility] for night-coverage after hours (4p -7a ) and weekends on [email or web address]  Patient seen with PA, agree  with the above note (made changes to reflect my thoughts to the above note).   She was noted to have some degree of altered mental status starting pm 4/11.  However, very lethargic after anesthesia for DCCV so head CT done, showing left frontal lobe ICH.  She was noted to be aphasic.  Today, responding only to pain.  I think she will need to be intubated for  airway control.  Currently protecting airway adequately so will let her go down for stat head CT to see if bleed has progressed.  She is being followed by neurology.  Will need their input based on CT.  She is now off all anticoagulation, INR 1.27.  BP low overnight, phenylephrine started.  Would favor changing this over to norepinephrine with her cardiomyopathy.  Keep SBP > 90 (do not overshoot BP control with CNS bleed).    She remains in NSR.  She is back on amiodarone gtt.   Metabolic acidosis, HCO3 dropping.  Acidosis will make it harder to maintain BP.  Hopefully CVVH for solute removal will start soon.   I updated sister and boyfriend.   CRITICAL CARE Performed by: Loralie Champagne  Total critical care time: 40 minutes in excess of the time spent by my PA above.   Critical care time was exclusive of separately billable procedures and treating other patients.  Critical care was necessary to treat or prevent imminent or life-threatening deterioration.  Critical care was time spent personally by me on the following activities: development of treatment plan with patient and/or surrogate as well as nursing, discussions with consultants, evaluation of patient's response to treatment, examination of patient, obtaining history from patient or surrogate, ordering and performing treatments and interventions, ordering and review of laboratory studies, ordering and review of radiographic studies, pulse oximetry and re-evaluation of patient's condition.  Loralie Champagne 01/21/2017 9:35 AM

## 2017-01-21 NOTE — Progress Notes (Addendum)
Assessment/plan:  1. Altered mental status: Suspected to be related to acute intracranial hemorrhage plus/minus embolic event. 2. Acute kidney injury on chronic kidney disease stage IV, hold on HD for now 3. Hypotension:  Subjective: Interval History: On pressors  Objective: Vital signs in last 24 hours: Temp:  [97.5 F (36.4 C)-98.5 F (36.9 C)] 97.5 F (36.4 C) (04/13 0840) Pulse Rate:  [63-108] 70 (04/13 1000) Resp:  [12-20] 15 (04/13 1000) BP: (71-143)/(43-105) 71/45 (04/13 0045) SpO2:  [85 %-100 %] 100 % (04/13 1144) Arterial Line BP: (78-107)/(46-74) 100/74 (04/13 1000) FiO2 (%):  [100 %] 100 % (04/13 1144) Weight:  [129.3 kg (285 lb)-131 kg (288 lb 12.8 oz)] 131 kg (288 lb 12.8 oz) (04/13 0530) Weight change: -1.725 kg (-3 lb 12.8 oz)  Intake/Output from previous day: 04/12 0701 - 04/13 0700 In: 1165.3 [I.V.:553.5; IV Piggyback:611.8] Out: -  Intake/Output this shift: Total I/O In: 243.6 [I.V.:243.6] Out: -   General appearance: unresponsive Resp: unresponsive Chest wall: no tenderness Cardio: regular rate and rhythm, S1, S2 normal, no murmur, click, rub or gallop  Lab Results:  Recent Labs  01/20/17 0358 01/21/17 0445  WBC 8.3 7.6  HGB 9.1* 8.9*  HCT 30.4* 30.0*  PLT 177 153   BMET:  Recent Labs  01/20/17 1509 01/21/17 0444  NA 134* 134*  K 5.2* 4.7  CL 97* 99*  CO2 19* 17*  GLUCOSE 83 91  BUN 31* 34*  CREATININE 6.13* 6.72*  CALCIUM 10.0 9.8   No results for input(s): PTH in the last 72 hours. Iron Studies: No results for input(s): IRON, TIBC, TRANSFERRIN, FERRITIN in the last 72 hours. Studies/Results: Dg Chest 1 View  Result Date: 01/21/2017 CLINICAL DATA:  Status post intubation today. EXAM: CHEST 1 VIEW COMPARISON:  Single-view of the chest earlier today. FINDINGS: New NG tube courses into the stomach and below the inferior margin of film. New endotracheal tube is in place with the tip at the level of clavicular heads in good position  well above the carina. Right IJ approach dialysis catheter is again seen. Cardiomegaly and interstitial edema are unchanged. Atherosclerosis is noted. IMPRESSION: ETT and NG tube projecting good position. Cardiomegaly and mild interstitial edema. Electronically Signed   By: Inge Rise M.D.   On: 01/21/2017 12:01   Ct Head Wo Contrast  Result Date: 01/21/2017 CLINICAL DATA:  Slurred speech. Recent hospitalization for atrial fibrillation, on heparin. Altered mental status. EXAM: CT HEAD WITHOUT CONTRAST TECHNIQUE: Contiguous axial images were obtained from the base of the skull through the vertex without intravenous contrast. COMPARISON:  01/20/2017. FINDINGS: Brain: Redemonstrated is a LEFT frontal mixed attenuation collection, biconvex on coronal imaging, with low attenuation fluid anteriorly, and hyperdense acute hemorrhage posteriorly. Maximum thickness of between 13 and 15 mm. Acute LEFT parafalcine subdural hemorrhage redemonstrated as well, small volume. No clear areas of cerebral infarction or vasogenic edema. 2 mm of LEFT-to-RIGHT shift. Vascular: No hyperdense vessel or unexpected calcification. Skull: Normal. Negative for fracture or focal lesion. Sinuses/Orbits: No acute finding. Other: None. IMPRESSION: Stable LEFT frontal mixed attenuation collections, as described above. 2 mm LEFT-to-RIGHT shift. I believe the collections could both be extra-axial, representing subdural/epidural hematoma/hygroma. Continued surveillance warranted. Patient could not cooperate for MRI. If clinically indicated, repeat could be performed with sedation. Electronically Signed   By: Staci Righter M.D.   On: 01/21/2017 10:18   Ct Head Wo Contrast  Addendum Date: 01/20/2017   ADDENDUM REPORT: 01/20/2017 16:18 ADDENDUM: Study discussed by telephone with  Dr. Evette Doffing on the Internal Medicine Team On 01/20/2017 at 1545 hours. He advised that the patient has been hospitalized in the ICU and on heparin recently for a fib.  This factor argues against the possibility of venous thrombosis, and in favor of a coagulopathy related bleed. No recent trauma. He plans a follow-up Brain MRI without and with contrast (I advised contrast even though the patient is recently on dialysis) when feasible. Electronically Signed   By: Genevie Ann M.D.   On: 01/20/2017 16:18   Result Date: 01/20/2017 CLINICAL DATA:  61 year old female with mental status changes since this morning and increased confusion throughout the day. Dialysis patient. EXAM: CT HEAD WITHOUT CONTRAST TECHNIQUE: Contiguous axial images were obtained from the base of the skull through the vertex without intravenous contrast. COMPARISON:  Head CT without contrast 10/02/2009 FINDINGS: Brain: In 2010 the left frontal lobe appeared within normal limits. Now there is both a 3 cm oval area of hypodensity within the anterior left frontal lobe (series 3, image 17), and an adjacent mixed density rounded hemorrhage or mass like area of about 2.6 cm. There does appear to be a small volume of superimposed left parasagittal subdural hemorrhage up to 7 mm in thickness. There is not an area of vasogenic edema in the left frontal lobe. Outside of the mixed density abnormality in the anterior left frontal lobe gray and white matter differentiation appears normal throughout the brain. No significant intracranial mass effect. No ventriculomegaly. Normal basilar cisterns. Partially empty sella. No cortical encephalomalacia identified. Vascular: Aside from hyperdensity involving the left superior sagittal sinus at the area of presumed subdural blood there is no suspicious intracranial vascular hyperdensity. Skull: Hyperostosis of the calvarium. Small 6-7 mm oval lucent lesions in the left occipital bone (series 4, image 24) and left frontal bone (image 44) are identified. However, the left frontal bone lesion is unchanged from 2010, and the left occipital bone lesion was probably present at that time although  much smaller. No definite destructive osseous lesion. Sinuses/Orbits: Visualized paranasal sinuses and mastoids are stable and well pneumatized. Other: No acute orbit or scalp soft tissue finding. IMPRESSION: 1. Unusual appearing mixed density mass like area along the surface of the anterior frontal lobe, with cytotoxic type edema as well as a rounded 2 cm area of hemorrhage. Apparent small volume superimposed nearby a left parafalcine subdural blood. 2. Etiology is unclear at this point with top differential considerations including: trauma such as Hemorrhagic Contusion and SDH, Venous Infarct (superior cortical vein and/or short segment sagittal sinus thrombosis), and tumor. 3. Negative CT appearance of the brain elsewhere. 4. Recommend follow-up brain MRI, preferably without and with IV contrast, to further characterize. 5. Small lucent areas in the skull appear chronic and are probably benign - possibly hyperparathyroid or renal osteodystrophy related in the setting of chronic renal failure. Electronically Signed: By: Genevie Ann M.D. On: 01/20/2017 15:39   Dg Chest Port 1 View  Result Date: 01/21/2017 CLINICAL DATA:  Shortness of Breath EXAM: PORTABLE CHEST 1 VIEW COMPARISON:  01/17/2017 FINDINGS: Cardiac shadow remains enlarged. Dialysis catheter is again seen. The left jugular central line is been removed. The lungs are well aerated bilaterally. No focal infiltrate or sizable effusion is seen. Mild fullness of the pulmonary vasculature is noted likely related to a degree of volume overload. No bony abnormality is seen. IMPRESSION: Mild volume overload likely related to the underlying Clinical state. No new focal abnormality is seen. Electronically Signed   By: Elta Guadeloupe  Lukens M.D.   On: 01/21/2017 10:03    Scheduled: . ampicillin (OMNIPEN) IV  2 g Intravenous Q12H  . cefTRIAXone (ROCEPHIN)  IV  2 g Intravenous Q12H  . Chlorhexidine Gluconate Cloth  6 each Topical Daily  . darbepoetin (ARANESP) injection -  NON-DIALYSIS  150 mcg Subcutaneous Q Tue-1800  . fluticasone  1 spray Each Nare BID  . mouth rinse  15 mL Mouth Rinse BID  . midodrine  10 mg Oral TID WC   Continuous: . amiodarone 30 mg/hr (01/21/17 1000)  . fentaNYL infusion INTRAVENOUS    . norepinephrine (LEVOPHED) Adult infusion 25 mcg/min (01/21/17 1000)  . phenylephrine (NEO-SYNEPHRINE) Adult infusion Stopped (01/21/17 1000)     LOS: 26 days   Jaliana Medellin C 01/21/2017,12:40 PM

## 2017-01-21 NOTE — Progress Notes (Signed)
EEG Completed; Results Pending  

## 2017-01-21 NOTE — Procedures (Signed)
Arterial Catheter Insertion Procedure Note Paige Marshall [id number] 01-May-1956  Procedure: Insertion of Arterial Catheter  Indications: Blood pressure monitoring  Procedure Details Consent: Risks of procedure as well as the alternatives and risks of each were explained to the (patient/caregiver).  Consent for procedure obtained. Time Out: Verified patient identification, verified procedure, site/side was marked, verified correct patient position, special equipment/implants available, medications/allergies/relevent history reviewed, required imaging and test results available.  Performed  Maximum sterile technique was used including antiseptics, cap, gloves, gown, hand hygiene, mask and sheet. Skin prep: Chlorhexidine; local anesthetic administered 20 gauge catheter was inserted into left radial artery using the Seldinger technique.  Evaluation Blood flow good; BP tracing good. Complications: No apparent complications.  Georgann Housekeeper, AGACNP-BC [facility] Pulmonology/Critical Care Pager [phone] or [phone]  01/21/2017 1:25 AM

## 2017-01-21 NOTE — Care Management Note (Addendum)
Case Management Note   Patient Details  Name: [name] MRN: [id number] Date of Birth: Jan 01, 1956  Subjective/Objective:   CHF, pt currently on Milrinone gtt, Amiodarone gtt, Cardizem gtt                    Action/Plan: Discharge Planning: please see previous notes  Chart reviewed. Pt has been to SNF rehab at [facility].  Plan is dc to home with HH. Pt active with [facility] for HHPT, RN, OT and aide. Will need HH orders with [facility] prior to dc. [facility]iaison, they will continue to follow post dc. Will continue to follow for dc needs.   PCP  EDWARDS, [place]   Expected Discharge Date:                  Expected Discharge Plan:  [place]  In-House Referral:  Clinical Social Work  Discharge planning Services  CM Consult  Post Acute Care Choice:  Home Health Choice offered to:  Patient  DME Arranged:    DME Agency:     HH Arranged:  RN, PT, OT [place] Agency:  [place]  Status of Service:  In process, will continue to follow  If discussed at Long Length of Stay Meetings, dates discussed:    Additional Comments: 01/21/2017  Pt had to have RR called yesterday on SD - transferred to [facility] - started on pressor and reintubated  01/14/17 Pt remains on multiple drips and CRRT  01/06/17 Pt remains on multiple drips.  CM will continue to follow for discharge needs Maryclare Labrador, RN 01/21/2017, 8:38 AM

## 2017-01-21 NOTE — Progress Notes (Signed)
PT Cancellation Note  Patient Details Name: JADAN ROUILLARD MRN: [id number] DOB: [birth year]   Cancelled Treatment:    Reason Eval/Treat Not Completed: Medical issues which prohibited therapy.  Noted decline in medical status.  Will check back on 4/16 to see if medical status improved.     Melvern Banker 01/21/2017, 12:00 PM Lavonia Dana, [place] 01/21/2017

## 2017-01-21 NOTE — Plan of Care (Signed)
Problem: Activity: Goal: Ability to tolerate increased activity will improve Outcome: Not Progressing Pt on bedrest  Problem: Health Behavior/Discharge Planning: Goal: Ability to safely manage health-related needs after discharge will improve Outcome: Not Progressing Pt with new altered mental status related to ongoing neurological issues

## 2017-01-22 ENCOUNTER — Inpatient Hospital Stay (HOSPITAL_COMMUNITY): Payer: Medicare Other

## 2017-01-22 DIAGNOSIS — Z452 Encounter for adjustment and management of vascular access device: Secondary | ICD-10-CM

## 2017-01-22 DIAGNOSIS — S065X9A Traumatic subdural hemorrhage with loss of consciousness of unspecified duration, initial encounter: Secondary | ICD-10-CM

## 2017-01-22 DIAGNOSIS — Z1639 Resistance to other specified antimicrobial drug: Secondary | ICD-10-CM

## 2017-01-22 DIAGNOSIS — Z978 Presence of other specified devices: Secondary | ICD-10-CM

## 2017-01-22 DIAGNOSIS — S065XAA Traumatic subdural hemorrhage with loss of consciousness status unknown, initial encounter: Secondary | ICD-10-CM

## 2017-01-22 DIAGNOSIS — B952 Enterococcus as the cause of diseases classified elsewhere: Secondary | ICD-10-CM

## 2017-01-22 DIAGNOSIS — N185 Chronic kidney disease, stage 5: Secondary | ICD-10-CM

## 2017-01-22 DIAGNOSIS — D181 Lymphangioma, any site: Secondary | ICD-10-CM

## 2017-01-22 LAB — CBC
HCT: 31.2 % — ABNORMAL LOW (ref 36.0–46.0)
Hemoglobin: 9.4 g/dL — ABNORMAL LOW (ref 12.0–15.0)
MCH: 26.8 pg (ref 26.0–34.0)
MCHC: 30.1 g/dL (ref 30.0–36.0)
MCV: 88.9 fL (ref 78.0–100.0)
Platelets: 126 10*3/uL — ABNORMAL LOW (ref 150–400)
RBC: 3.51 MIL/uL — AB (ref 3.87–5.11)
RDW: 25.7 % — AB (ref 11.5–15.5)
WBC: 9.9 10*3/uL (ref 4.0–10.5)

## 2017-01-22 LAB — RENAL FUNCTION PANEL
ALBUMIN: 2.4 g/dL — AB (ref 3.5–5.0)
ANION GAP: 18 — AB (ref 5–15)
ANION GAP: 16 — AB (ref 5–15)
Albumin: 2.6 g/dL — ABNORMAL LOW (ref 3.5–5.0)
BUN: 37 mg/dL — ABNORMAL HIGH (ref 6–20)
BUN: 36 mg/dL — ABNORMAL HIGH (ref 6–20)
CALCIUM: 9.7 mg/dL (ref 8.9–10.3)
CHLORIDE: 97 mmol/L — AB (ref 101–111)
CHLORIDE: 97 mmol/L — AB (ref 101–111)
CO2: 18 mmol/L — AB (ref 22–32)
CO2: 19 mmol/L — ABNORMAL LOW (ref 22–32)
CREATININE: 7.45 mg/dL — AB (ref 0.44–1.00)
Calcium: 9.2 mg/dL (ref 8.9–10.3)
Creatinine, Ser: 6.77 mg/dL — ABNORMAL HIGH (ref 0.44–1.00)
GFR calc non Af Amer: 6 mL/min — ABNORMAL LOW (ref >60)
GFR, EST AFRICAN AMERICAN: 6 mL/min — AB (ref >60)
GFR, EST AFRICAN AMERICAN: 7 mL/min — AB (ref >60)
GFR, EST NON AFRICAN AMERICAN: 5 mL/min — AB (ref >60)
Glucose, Bld: 112 mg/dL — ABNORMAL HIGH (ref 65–99)
Glucose, Bld: 94 mg/dL (ref 65–99)
POTASSIUM: 4.3 mmol/L (ref 3.5–5.1)
Phosphorus: 5.8 mg/dL — ABNORMAL HIGH (ref 2.5–4.6)
Phosphorus: 4.4 mg/dL (ref 2.5–4.6)
Potassium: 4.4 mmol/L (ref 3.5–5.1)
SODIUM: 133 mmol/L — AB (ref 135–145)
SODIUM: 132 mmol/L — AB (ref 135–145)

## 2017-01-22 LAB — LACTIC ACID, PLASMA: LACTIC ACID, VENOUS: 1.4 mmol/L (ref 0.5–1.9)

## 2017-01-22 LAB — POCT I-STAT 3, ART BLOOD GAS (G3+)
ACID-BASE DEFICIT: 7 mmol/L — AB (ref 0.0–2.0)
Bicarbonate: 18.6 mmol/L — ABNORMAL LOW (ref 20.0–28.0)
O2 Saturation: 87 %
PCO2 ART: 38.3 mmHg (ref 32.0–48.0)
PH ART: 7.295 — AB (ref 7.350–7.450)
PO2 ART: 59 mmHg — AB (ref 83.0–108.0)
Patient temperature: 99.1
TCO2: 20 mmol/L (ref 0–100)

## 2017-01-22 LAB — TROPONIN I: TROPONIN I: 0.59 ng/mL — AB (ref <0.03)

## 2017-01-22 LAB — PROTIME-INR
INR: 1.44
Prothrombin Time: 17.7 seconds — ABNORMAL HIGH (ref 11.4–15.2)

## 2017-01-22 LAB — MAGNESIUM: MAGNESIUM: 2.3 mg/dL (ref 1.7–2.4)

## 2017-01-22 MED ORDER — EPINEPHRINE PF 1 MG/ML IJ SOLN
0.5000 ug/min | INTRAVENOUS | Status: DC
  Administered 2017-01-22: 5 ug/min via INTRAVENOUS
  Filled 2017-01-22: qty 4

## 2017-01-22 MED ORDER — ORAL CARE MOUTH RINSE
15.0000 mL | OROMUCOSAL | Status: DC
  Administered 2017-01-22 – 2017-01-28 (×55): 15 mL via OROMUCOSAL

## 2017-01-22 MED ORDER — SODIUM CHLORIDE 0.9 % IV SOLN
2.0000 g | Freq: Three times a day (TID) | INTRAVENOUS | Status: AC
  Administered 2017-01-22 – 2017-01-26 (×13): 2 g via INTRAVENOUS
  Filled 2017-01-22 (×14): qty 2000

## 2017-01-22 MED ORDER — HEPARIN SODIUM (PORCINE) 1000 UNIT/ML DIALYSIS
1000.0000 [IU] | INTRAMUSCULAR | Status: DC | PRN
  Filled 2017-01-22: qty 6

## 2017-01-22 MED ORDER — VITAMIN K1 10 MG/ML IJ SOLN
5.0000 mg | Freq: Every day | INTRAVENOUS | Status: AC
  Administered 2017-01-22 – 2017-01-23 (×2): 5 mg via INTRAVENOUS
  Filled 2017-01-22 (×2): qty 0.5

## 2017-01-22 MED ORDER — PRISMASOL BGK 4/2.5 32-4-2.5 MEQ/L IV SOLN
INTRAVENOUS | Status: DC
Start: 2017-01-22 — End: 2017-01-24
  Administered 2017-01-22 – 2017-01-24 (×3): via INTRAVENOUS_CENTRAL
  Filled 2017-01-22 (×3): qty 5000

## 2017-01-22 MED ORDER — PRISMASOL BGK 4/2.5 32-4-2.5 MEQ/L IV SOLN
INTRAVENOUS | Status: DC
Start: 2017-01-22 — End: 2017-01-29
  Administered 2017-01-22 – 2017-01-29 (×11): via INTRAVENOUS_CENTRAL
  Filled 2017-01-22 (×15): qty 5000

## 2017-01-22 MED ORDER — PRISMASOL BGK 4/2.5 32-4-2.5 MEQ/L IV SOLN
INTRAVENOUS | Status: DC
  Administered 2017-01-22 – 2017-01-24 (×7): via INTRAVENOUS_CENTRAL
  Filled 2017-01-22 (×12): qty 5000

## 2017-01-22 MED ORDER — CHLORHEXIDINE GLUCONATE 0.12% ORAL RINSE (MEDLINE KIT)
15.0000 mL | Freq: Two times a day (BID) | OROMUCOSAL | Status: DC
  Administered 2017-01-23 – 2017-01-28 (×11): 15 mL via OROMUCOSAL

## 2017-01-22 NOTE — Procedures (Signed)
Arterial Catheter Insertion Procedure Note ANONA GIOVANNINI [id number] [birth year]  Procedure: Insertion of Arterial Catheter  Indications: Blood pressure monitoring  Procedure Details Consent: Unable to obtain consent because of emergent medical necessity. Time Out: Verified patient identification, verified procedure, site/side was marked, verified correct patient position, special equipment/implants available, medications/allergies/relevent history reviewed, required imaging and test results available.  Performed  Maximum sterile technique was used including antiseptics, cap, gloves, gown, hand hygiene, mask and sheet. Skin prep: Chlorhexidine; local anesthetic administered 24 gauge catheter was inserted into right femoral artery using the Seldinger technique.  Evaluation Blood flow good; BP tracing good. Complications: No apparent complications.   Jevin Camino 01/22/2017

## 2017-01-22 NOTE — Progress Notes (Addendum)
STROKE TEAM PROGRESS NOTE   SUBJECTIVE (INTERVAL HISTORY) Brother and sister are at the bedside. Updated them regarding pt condition and answered all their questions. Pt still intubated, on fentanyl, poorly responsive. 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 Pt Cre continue to worse and today started on CRRT.    OBJECTIVE Temp:  [98.7 F (37.1 C)-99.2 F (37.3 C)] 99.2 F (37.3 C) (04/14 1141) Pulse Rate:  [40-126] 72 (04/14 1515) Cardiac Rhythm: Normal sinus rhythm (04/14 1200) Resp:  [16-26] 24 (04/14 1515) BP: (78-159)/(45-79) 107/59 (04/14 1500) SpO2:  [89 %-100 %] 96 % (04/14 1515) Arterial Line BP: (50-157)/(27-76) 110/48 (04/14 1515) FiO2 (%):  [40 %-100 %] 50 % (04/14 1200) Weight:  [133 kg (293 lb 3.4 oz)] 133 kg (293 lb 3.4 oz) (04/14 0800)  CBC:   Recent Labs Lab 01/21/17 0445 01/22/17 0412  WBC 7.6 9.9  HGB 8.9* 9.4*  HCT 30.0* 31.2*  MCV 90.4 88.9  PLT 153 126*    Basic Metabolic Panel:   Recent Labs Lab 01/21/17 0445 01/21/17 1822 01/22/17 0412  NA  --  133* 133*  K  --  4.4 4.4  CL  --  97* 97*  CO2  --  17* 18*  GLUCOSE  --  113* 112*  BUN  --  35* 37*  CREATININE  --  7.09* 7.45*  CALCIUM  --  9.7 9.7  MG 2.4  --  2.3  PHOS  --  6.2* 5.8*    Lipid Panel:     Component Value Date/Time   CHOL 167 01/15/2015 0346   TRIG 194 (H) 01/15/2015 0346   HDL 35 (L) 01/15/2015 0346   CHOLHDL 4.8 01/15/2015 0346   VLDL 39 01/15/2015 0346   LDLCALC 93 01/15/2015 0346   HgbA1c:  Lab Results  Component Value Date   HGBA1C  10/02/2009    5.6 (NOTE) The ADA recommends the following therapeutic goal for glycemic control related to Hgb A1c measurement: Goal of therapy: <6.5 Hgb A1c  Reference: American Diabetes Association: Clinical Practice Recommendations 2010, Diabetes Care, 2010, 33: (Suppl  1).   Urine Drug Screen:     Component Value Date/Time   LABOPIA NONE DETECTED 11/15/2009 2107    COCAINSCRNUR NONE DETECTED 11/15/2009 2107   LABBENZ NONE DETECTED 11/15/2009 2107   AMPHETMU NONE DETECTED 11/15/2009 2107   THCU NONE DETECTED 11/15/2009 2107   LABBARB  11/15/2009 2107    NONE DETECTED        DRUG SCREEN FOR MEDICAL PURPOSES ONLY.  IF CONFIRMATION IS NEEDED FOR ANY PURPOSE, NOTIFY LAB WITHIN 5 DAYS.        LOWEST DETECTABLE LIMITS FOR URINE DRUG SCREEN Drug Class       Cutoff (ng/mL) Amphetamine      1000 Barbiturate      200 Benzodiazepine   497 Tricyclics       026 Opiates          300 Cocaine          300 THC              50    Alcohol Level No results found for: [place] I have personally reviewed the radiological images below and agree with the radiology interpretations.  Ct Head Wo Contrast 01/21/2017 Stable LEFT frontal mixed attenuation collections, as described above. 2 mm LEFT-to-RIGHT shift. I believe the collections could both be extra-axial, representing subdural/epidural hematoma/hygroma. Continued surveillance warranted. Patient  could not cooperate for MRI. If clinically indicated, repeat could be performed with sedation.   Mr Jodene Nam Head Wo Contrast 01/22/2017 1. 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  2. Otherwise normal MRA circle of Willis without significant proximal stenosis or occlusion.  No other aneurysms are present.   Mr Brain Wo Contrast 01/21/2017 1. Slightly incomplete noncontrast brain MRI.  2. Left-sided subdural hematoma, unchanged from earlier CT. Trace rightward midline shift.  3. Trace right frontal subdural hematoma.  4. No acute infarct.   Mr Mrv Head Wo Cm 01/22/2017 Negative MR venogram.   Dg Chest Port 1 View 01/22/2017 1. Increase in LEFT basilar atelectasis.  2. Central venous pulmonary congestion similar prior.  3. Stable support apparatus.   TTE 01/11/2017 - Left ventricle: The cavity size was mildly dilated. Wall   thickness was increased in a pattern of mild LVH. Left   ventricular  geometry showed evidence of eccentric hypertrophy.   Systolic function was moderately reduced. The estimated ejection   fraction was in the range of 35% to 40%. Moderate diffuse   hypokinesis with no identifiable regional variations. Acoustic   contrast opacification revealed no evidence ofthrombus. - Ventricular septum: The contour showed diastolic flattening.   These changes are consistent with RV volume overload. - Aortic valve: There was mild to moderate regurgitation directed   centrally in the LVOT. - Mitral valve: There was mild to moderate regurgitation directed   eccentrically and posteriorly. - Left atrium: The atrium was moderately dilated. - Right ventricle: The cavity size was severely dilated. Wall   thickness was normal. Systolic function was mildly reduced. - Right atrium: The atrium was severely dilated. - Tricuspid valve: There was malcoaptation of the valve leaflets.   There was moderate-severe regurgitation. - Pulmonary arteries: Systolic pressure was severely increased. PA   peak pressure: 71 mm Hg (S). - Pericardium, extracardiac: A trivial pericardial effusion was   identified posterior to the heart.  EEG  01/22/2017 This is an abnormal EEG due to severe generalized suppressed and slowing of brain activity and characteristic waveforms consistent with a metabolic encephalopathy.  The patient is not in non-convulsive status epilepticus.     PHYSICAL EXAM  Temp:  [98.7 F (37.1 C)-99.2 F (37.3 C)] 99.2 F (37.3 C) (04/14 1141) Pulse Rate:  [40-126] 72 (04/14 1515) Resp:  [16-26] 24 (04/14 1515) BP: (78-159)/(45-79) 107/59 (04/14 1500) SpO2:  [89 %-100 %] 96 % (04/14 1515) Arterial Line BP: (50-157)/(27-76) 110/48 (04/14 1515) FiO2 (%):  [40 %-100 %] 50 % (04/14 1200) Weight:  [133 kg (293 lb 3.4 oz)] 133 kg (293 lb 3.4 oz) (04/14 0800)  General - obese, well developed, intubated on sedation.  Ophthalmologic - Fundi not visualized  Cardiovascular -  Regular rate and rhythm.  Neuro - intubated on sedation, slight open eyes on voice and pain, not following commands. With eyes open, not blinking to visual threat, not tracking. PERRL, doll's eye present, positive corneal and gag. Resume over the vent. On pain stimulation no movement on both are per extremities, however mild withdrawal on bilateral lower extremities. DTR diminished and no Babinski. Sensation, coordination, and gait not tested.    ASSESSMENT/PLAN Ms. SHANIEKA BLEA is a 61 y.o. female with history ofA. fib on Coumadin, stroke, CHF, CK D, HTN, HLD, obesity, cardiomyopathy presenting with Pulmonary edema, respiratory distress and fluid overload. Received cardioversion, however postprocedure found to have AMS and aphasia. Was on Coumadin with heparin IV bridge, INR 1.74. CT found to  have left frontal ICH and parafalcine SDH.   ICH and SDH:  Left frontal small ICH with parafalcine small SDH, etiology not clear. Could be due to coumadin and heparin IV bridge but pt BP was low. Pt had fall in 09/2016.  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   MRV - negative  2D Echo - EF 35-40% - no cardiac source of emboli identified.  EEG - severe generalized suppressed and slowing of brain activity and characteristic waveforms consistent with a metabolic encephalopathy.    SCDs for VTE prophylaxis Diet NPO time specified  heparin IV and warfarin daily discontinued and INR reversed, now on No antithrombotic  Ongoing aggressive stroke risk factor management  Therapy recommendations:  Pending   Disposition:  Pending  Atrial fibrillation   Was on coumadin with heparin bridge  INR 1.74, reversed with VitK and kcentra  Hold off anticoagulation for now  May consider resume anticoagulation once blood absorbed and pt stabilized  On amiodarone  IV  AMS  The degree of AMS cannot be explained by CT/MRI findings  Multifactorial, likely due to worsening renal function, bacteremia, sepsis and CHF  Nephrology and ID on board  On ampicillin, Rocephin  EEG - abnormal EEG due to severe generalized suppressed and slowing of brain activity and characteristic waveforms consistent with a metabolic encephalopathy.   Ammonia level slightly elevated at 40  Hypotension on Midrin, Levophed, and Neo BP goal < 140 due to SDH  AKI on CKD  Cre worsening  Nephrology on board  On CRRT today  VRE bacteremia / sepsis  Blood Cx showed VRE enterococcus  ID on board  On ampicillin and rocephine  BP low on pressors  Other Active Problems  CHF  Cardiomyopathy EF 35-40%  Obesity  OSA  Hospital day # 27  This patient is critically ill due to left frontal and parafalcine SDH/hygroma, AMS, afib not on AC and at significant risk of neurological worsening, death form hematoma expansion, brain herniation, stroke, heart failure, sepsis, hypotension. This patient's care requires constant monitoring of vital signs, hemodynamics, respiratory and cardiac monitoring, review of multiple databases, neurological assessment, discussion with family, other specialists and medical decision making of high complexity. I spent 45 minutes of neurocritical care time in the care of this patient. I had long discussion with sister and brother at bedside, updated them pt current condition, treatment plan and answered all their questions.   Rosalin Hawking, MD PhD Stroke Neurology 01/22/2017 4:48 PM   To contact Stroke Continuity provider, please refer to [email or web address] After hours, contact General Neurology

## 2017-01-22 NOTE — Progress Notes (Signed)
Patient taken to MRI and back without issues. Upon arrival back to ICU Artline was without wave form and after evaluation, line was pulled. MD in room at this time replacing artline under ultrasound if possible.

## 2017-01-22 NOTE — Progress Notes (Signed)
PULMONARY / CRITICAL CARE MEDICINE   Name: Paige Marshall MRN: [id number] DOB: [birth year]    ADMISSION DATE:  12/26/2016 CONSULTATION DATE:  01/20/2017  REFERRING MD:  Dr. Evette Doffing   CHIEF COMPLAINT:  AMS  Brief:   61 year old woman with poorly controlled OHS, chronic combined systolic and diastolic CHF, a-fib/a-flutter on chronic anticoagulation, hypertension, COPD, CKD Stage 3. Admitted to ED from [facility] on 3/18 for  acute decompensated CHF complicated by cardiorenal syndrome. During stay developed acute on chronic kidney disease requiring CRRT, now being followed by nephrology for PRN HD. Blood culture positive for VRE bacteremia being treated with ampicillin and ceftriaxone. Transferred to step-down on 4/11. On 4/12 noted to be more lethargic and aphasic. CT Head revealed mixed density mass like area along the surface of the anterior frontal lobe with cytotoxic edema as well as a rounded 2 cm hemorrhage.  SUBJECTIVE:  Results of MRI noted with likely subdural hemorrhage. Case discussed with neurology who requested MRA/MRV for further characterization She returned from scan with low blood pressure, a line not functional Started on epi drip in addition to levo. Right femoral arterial line placed  VITAL SIGNS: BP 112/61   Pulse 72   Temp 99.2 F (37.3 C) (Oral)   Resp 19   Ht 5\' 8"  (1.727 m)   Wt 288 lb 12.8 oz (131 kg)   LMP 09/07/2011   SpO2 93%   BMI 43.91 kg/m   HEMODYNAMICS: CVP:  [17 mmHg-18 mmHg] 17 mmHg  VENTILATOR SETTINGS: Vent Mode: PRVC FiO2 (%):  [50 %-100 %] 100 % Set Rate:  [20 bmp] 20 bmp Vt Set:  [510 mL-520 mL] 510 mL PEEP:  [5 cmH20] 5 cmH20 Plateau Pressure:  [22 SHF02-63 cmH20] 22 cmH20  INTAKE / OUTPUT: I/O last 3 completed shifts: In: 2548 [I.V.:2148; IV Piggyback:400] Out: 0   PHYSICAL EXAMINATION: Gen:      No acute distress HEENT:  EOMI, sclera anicteric Neck:     No masses; no thyromegaly, ET tube in place Lungs:    Clear to  auscultation bilaterally; normal respiratory effort CV:         Regular rate and rhythm; no murmurs Abd:      Obese, + bowel sounds; soft, non-tender; no palpable masses, no distension Ext:    No edema; adequate peripheral perfusion Skin:      Warm and dry; no rash Neuro: Sedated, nonresponsive  LABS:  BMET  Recent Labs Lab 01/21/17 0444 01/21/17 1822 01/22/17 0412  NA 134* 133* 133*  K 4.7 4.4 4.4  CL 99* 97* 97*  CO2 17* 17* 18*  BUN 34* 35* 37*  CREATININE 6.72* 7.09* 7.45*  GLUCOSE 91 113* 112*    Electrolytes  Recent Labs Lab 01/20/17 0358  01/21/17 0444 01/21/17 0445 01/21/17 1822 01/22/17 0412  CALCIUM 10.2  < > 9.8  --  9.7 9.7  MG 2.5*  --   --  2.4  --  2.3  PHOS 6.0*  < > 6.1*  --  6.2* 5.8*  < > = values in this interval not displayed.  CBC  Recent Labs Lab 01/20/17 0358 01/21/17 0445 01/22/17 0412  WBC 8.3 7.6 9.9  HGB 9.1* 8.9* 9.4*  HCT 30.4* 30.0* 31.2*  PLT 177 153 126*    Coag's  Recent Labs Lab 01/21/17 0400 01/21/17 1339 01/22/17 0412  INR 1.27 1.34 1.44    Sepsis Markers No results for input(s): LATICACIDVEN, PROCALCITON, O2SATVEN in the last 168 hours.  ABG  Recent Labs Lab 01/17/17 1015 01/20/17 0930 01/21/17 1617  PHART 7.358 7.295* 7.288*  PCO2ART 29.2* 40.8 43.3  PO2ART 145* 71.6* 120.0*    Liver Enzymes  Recent Labs Lab 01/20/17 0930  01/21/17 0444 01/21/17 1822 01/22/17 0412  AST 33  --   --   --   --   ALT 18  --   --   --   --   ALKPHOS 74  --   --   --   --   BILITOT 0.8  --   --   --   --   ALBUMIN 2.8*  < > 2.8* 2.7* 2.6*  < > = values in this interval not displayed.  Cardiac Enzymes No results for input(s): TROPONINI, PROBNP in the last 168 hours.  Glucose No results for input(s): GLUCAP in the last 168 hours.  Imaging Dg Chest 1 View  Result Date: 01/21/2017 CLINICAL DATA:  Status post intubation today. EXAM: CHEST 1 VIEW COMPARISON:  Single-view of the chest earlier today.  FINDINGS: New NG tube courses into the stomach and below the inferior margin of film. New endotracheal tube is in place with the tip at the level of clavicular heads in good position well above the carina. Right IJ approach dialysis catheter is again seen. Cardiomegaly and interstitial edema are unchanged. Atherosclerosis is noted. IMPRESSION: ETT and NG tube projecting good position. Cardiomegaly and mild interstitial edema. Electronically Signed   By: Inge Rise M.D.   On: 01/21/2017 12:01   Ct Head Wo Contrast  Result Date: 01/21/2017 CLINICAL DATA:  Slurred speech. Recent hospitalization for atrial fibrillation, on heparin. Altered mental status. EXAM: CT HEAD WITHOUT CONTRAST TECHNIQUE: Contiguous axial images were obtained from the base of the skull through the vertex without intravenous contrast. COMPARISON:  01/20/2017. FINDINGS: Brain: Redemonstrated is a LEFT frontal mixed attenuation collection, biconvex on coronal imaging, with low attenuation fluid anteriorly, and hyperdense acute hemorrhage posteriorly. Maximum thickness of between 13 and 15 mm. Acute LEFT parafalcine subdural hemorrhage redemonstrated as well, small volume. No clear areas of cerebral infarction or vasogenic edema. 2 mm of LEFT-to-RIGHT shift. Vascular: No hyperdense vessel or unexpected calcification. Skull: Normal. Negative for fracture or focal lesion. Sinuses/Orbits: No acute finding. Other: None. IMPRESSION: Stable LEFT frontal mixed attenuation collections, as described above. 2 mm LEFT-to-RIGHT shift. I believe the collections could both be extra-axial, representing subdural/epidural hematoma/hygroma. Continued surveillance warranted. Patient could not cooperate for MRI. If clinically indicated, repeat could be performed with sedation. Electronically Signed   By: Staci Righter M.D.   On: 01/21/2017 10:18   Mr Brain Wo Contrast  Result Date: 01/21/2017 CLINICAL DATA:  Altered mental status.  Intracranial hemorrhage.  EXAM: MRI HEAD WITHOUT CONTRAST TECHNIQUE: Multiplanar, multiecho pulse sequences of the brain and surrounding structures were obtained without intravenous contrast. COMPARISON:  Head CT 01/21/2017 FINDINGS: The examination had to be discontinued prior to completion, and axial T1 and coronal T2 sequences were not obtained. Brain: Left-sided subdural hematoma is greatest over the frontal convexity and measures up to 12 mm in thickness, unchanged from the CT earlier today. The more anterior component is T1, T2, and FLAIR hyperintense and simpler in appearance, corresponding to the low-density fluid on CT. At the posterior aspect of this are more complex appearing blood products, corresponding to the hyperdense hemorrhage on CT. Minimal subdural hematoma extends posteriorly over the left cerebral convexity as well as into the middle cranial fossa over the temporal lobe and into the interhemispheric fissure.  There is also trace subdural hematoma over the anterior right frontal lobe. The left-sided hematoma results in mild left cerebral hemispheric sulcal effacement and minimal rightward midline shift of 3 mm, unchanged. There is no acute infarct. No significant cerebral white matter disease or brain edema is identified. Cerebral volume is within normal limits for age. Vascular: Major intracranial vascular flow voids are preserved. Skull and upper cervical spine: Decreased bone marrow signal intensity likely reflects known underlying anemia. Sinuses/Orbits: Unremarkable orbits. Fluid in the nasopharynx. Clear paranasal sinuses. Trace bilateral mastoid fluid. Other: None. IMPRESSION: 1. Slightly incomplete noncontrast brain MRI. 2. Left-sided subdural hematoma, unchanged from earlier CT. Trace rightward midline shift. 3. Trace right frontal subdural hematoma. 4. No acute infarct. Electronically Signed   By: Logan Bores M.D.   On: 01/21/2017 15:53   Dg Chest Port 1 View  Result Date: 01/21/2017 CLINICAL DATA:  Central  line placement EXAM: PORTABLE CHEST 1 VIEW COMPARISON:  Portable exam 1300 hours compared 01/21/2017 FINDINGS: Tip of endotracheal tube projects 4.1 cm above carina. Nasogastric tube extends into stomach. RIGHT jugular dual-lumen catheter projects over cavoatrial junction. New RIGHT jugular central venous catheter with tip projecting over SVC. EKG leads project over chest. Enlargement of cardiac silhouette with pulmonary vascular congestion. Atherosclerotic calcification aorta. Slightly improved interstitial edema. No pleural effusion or pneumothorax. IMPRESSION: No pneumothorax following LEFT jugular line placement. Slightly improved pulmonary edema. Electronically Signed   By: Lavonia Dana M.D.   On: 01/21/2017 13:16   Dg Chest Port 1 View  Result Date: 01/21/2017 CLINICAL DATA:  Shortness of Breath EXAM: PORTABLE CHEST 1 VIEW COMPARISON:  01/17/2017 FINDINGS: Cardiac shadow remains enlarged. Dialysis catheter is again seen. The left jugular central line is been removed. The lungs are well aerated bilaterally. No focal infiltrate or sizable effusion is seen. Mild fullness of the pulmonary vasculature is noted likely related to a degree of volume overload. No bony abnormality is seen. IMPRESSION: Mild volume overload likely related to the underlying Clinical state. No new focal abnormality is seen. Electronically Signed   By: Inez Catalina M.D.   On: 01/21/2017 10:03   Dg Abd Portable 1v  Result Date: 01/22/2017 CLINICAL DATA:  Orogastric tube placement.  Initial encounter. EXAM: PORTABLE ABDOMEN - 1 VIEW COMPARISON:  CT of the abdomen and pelvis performed 03/08/2016 FINDINGS: The patient's enteric tube is noted ending overlying the body of the stomach. The visualized bowel gas pattern is unremarkable. Scattered air and stool filled loops of colon are seen; no abnormal dilatation of small bowel loops is seen to suggest small bowel obstruction. No free intra-abdominal air is identified, though evaluation for  free air is limited on a single supine view. The visualized osseous structures are within normal limits; the sacroiliac joints are unremarkable in appearance. IMPRESSION: Enteric tube noted ending overlying the body of the stomach. Electronically Signed   By: Garald Balding M.D.   On: 01/22/2017 00:20   STUDIES: CT Head 4/11 > mixed density mass like area along the surface of the anterior frontal lobe, with cytotoxic type edema as well as a rounded 2 cm area of hemorrhage EEG 4/13 >> CT Head 4/13 > Left frontal mixed attenuation collection, hyperdense acute hemorrhage posteriorly, max thickness between 13-15 mm, acute left parafalcine subdural hemorrhage, 2 mm left to right shift  MRI Brain 4/13 >> left subdural hematoma with slight shift Chest x-ray. 4/14 >> more confluent left lower lobe opacity, ET tube and lines in place. Pulmonary edema persists. I have  reviewed her images personally. MRA/MRV 4/14 >>   CULTURES: Blood 4/01 > E faecalis, vancomycin resistant  ANTIBIOTICS: Vancomycin 4/01 > 4/03 Daptomycin 4/03 > 4/05 Ampicillin 4/05 > Rocephin 4/05 >  SIGNIFICANT EVENTS: 3/18 Admit 3/21 start CRRT 3/27 DC cardioversion 3/29 To SDU 3/31 Fever 4/06 Failed repeat DC cardioversion 4/08 Transition off CRRT 4/12 New ICH   LINES/TUBES: Rt IJ HD cath 3/31 >> Lt IJ CVL 4/02 > 4/13 Lt IJ CVL 4/13 >> ETT 4/13 >>  DISCUSSION: 61 yo female admitted with CHF exacerbation complicated by renal failure and VRE bacteremia, on 4/12 found to have new ICH after cardioversion  ASSESSMENT / PLAN:  PULMONARY A: Chronic Hypoxic/Hypercarbic resp failure in setting of COPD, OSA/OHS P:   Continue full vent support. No plans to wean due to poor mental status Follow ABG, chest x-ray  CARDIOVASCULAR A:  Acute on Chronic Combined CHF Hypotension  Afib S/P cardioversion 4/12 H/O HTN P:  Cardiac monitoring Levophed drip, started on epinephrine Will wean down pressors as MAPs  tolerate, Continue Medipren, amiodarone Holding anticoagulation in the setting of intracranial hemorrhage  RENAL A:   Acute on Chronic renal failure with cardiorenal syndrome  P:   Remains anuric. Will discuss with renal about starting CVVH  GASTROINTESTINAL A:   Dysphagia  P:   Keep nothing by mouth Start tube feeds PPI  HEMATOLOGIC A:   ICH P:  Hold anticoagulation Give 2 more doses of vitamin K. She has already received Kcentra SCDs   INFECTIOUS A:   VRE Bacteremia  P:   Continue antibiotics till 4/18 per ID 2 weeks after course completed will need blood cultures  Trend WBC and Fever curve   ENDOCRINE A:   No issues    P:   Follow glucose on BMP  NEUROLOGIC A:   Left Frontal ICH  Uremic Encephalopathy vs Subclinical seizures vs showering emboli  Deconditioning  P:   RASS goal: 0 Neurology following Follow MRA/MRV. May need to get neurosurgery involved depending on the results.  FAMILY  - Updates: Sister updated 4/13. No family at bedside 4/14  - Inter-disciplinary family meet or Palliative Care meeting due by: ongoing    The patient is critically ill with multiple organ system failure and requires high complexity decision making for assessment and support, frequent evaluation and titration of therapies, advanced monitoring, review of radiographic studies and interpretation of complex data.   Critical Care Time devoted to patient care services, exclusive of separately billable procedures, described in this note is 45 minutes.   Marshell Garfinkel MD [facility] Pager [phone] If no answer or after 3pm call: [phone] 01/22/2017, 7:46 AM

## 2017-01-22 NOTE — Plan of Care (Signed)
Problem: Activity: Goal: Ability to tolerate increased activity will improve Outcome: Not Progressing Bedrest  Problem: Cardiac: Goal: Ability to achieve and maintain adequate cardiopulmonary perfusion will improve Outcome: Not Progressing Requiring high dose levophed

## 2017-01-22 NOTE — Progress Notes (Signed)
Assessment/plan:  1. Altered mental status:Suspected to be related to acute intracranial hemorrhage plus/minus embolic event. 2. Oliguric Acute kidney injury on chronic kidney disease stage.  CCM request restart of CRRT 3.Hypotension: On pressors 4. VDRF   Objective: Vital signs in last 24 hours: Temp:  [98.7 F (37.1 C)-99.2 F (37.3 C)] 98.8 F (37.1 C) (04/14 0830) Pulse Rate:  [40-87] 77 (04/14 0915) Resp:  [15-24] 19 (04/14 0945) BP: (78-159)/(45-79) 123/64 (04/14 0945) SpO2:  [89 %-100 %] 93 % (04/14 0915) Arterial Line BP: (50-157)/(27-82) 137/58 (04/14 0945) FiO2 (%):  [40 %-100 %] 50 % (04/14 0815) Weight:  [133 kg (293 lb 3.4 oz)] 133 kg (293 lb 3.4 oz) (04/14 0800) Weight change:   Intake/Output from previous day: 04/13 0701 - 04/14 0700 In: 1716.2 [I.V.:1666.2; IV Piggyback:50] Out: 0  Intake/Output this shift: Total I/O In: 232.9 [I.V.:132.9; IV Piggyback:100] Out: -   General appearance: no appropriate response Cardio: regular rate and rhythm, S1, S2 normal, no murmur, click, rub or gallop GI: soft, non-tender; bowel sounds normal; no masses,  no organomegaly Extremities: extremities normal, atraumatic, no cyanosis or edema  Moves L foot to stim  Lab Results:  Recent Labs  01/21/17 0445 01/22/17 0412  WBC 7.6 9.9  HGB 8.9* 9.4*  HCT 30.0* 31.2*  PLT 153 126*   BMET:  Recent Labs  01/21/17 1822 01/22/17 0412  NA 133* 133*  K 4.4 4.4  CL 97* 97*  CO2 17* 18*  GLUCOSE 113* 112*  BUN 35* 37*  CREATININE 7.09* 7.45*  CALCIUM 9.7 9.7   No results for input(s): PTH in the last 72 hours. Iron Studies: No results for input(s): IRON, TIBC, TRANSFERRIN, FERRITIN in the last 72 hours. Studies/Results: Dg Chest 1 View  Result Date: 01/21/2017 CLINICAL DATA:  Status post intubation today. EXAM: CHEST 1 VIEW COMPARISON:  Single-view of the chest earlier today. FINDINGS: New NG tube courses into the stomach and below the inferior margin of film. New  endotracheal tube is in place with the tip at the level of clavicular heads in good position well above the carina. Right IJ approach dialysis catheter is again seen. Cardiomegaly and interstitial edema are unchanged. Atherosclerosis is noted. IMPRESSION: ETT and NG tube projecting good position. Cardiomegaly and mild interstitial edema. Electronically Signed   By: Inge Rise M.D.   On: 01/21/2017 12:01   Ct Head Wo Contrast  Result Date: 01/21/2017 CLINICAL DATA:  Slurred speech. Recent hospitalization for atrial fibrillation, on heparin. Altered mental status. EXAM: CT HEAD WITHOUT CONTRAST TECHNIQUE: Contiguous axial images were obtained from the base of the skull through the vertex without intravenous contrast. COMPARISON:  01/20/2017. FINDINGS: Brain: Redemonstrated is a LEFT frontal mixed attenuation collection, biconvex on coronal imaging, with low attenuation fluid anteriorly, and hyperdense acute hemorrhage posteriorly. Maximum thickness of between 13 and 15 mm. Acute LEFT parafalcine subdural hemorrhage redemonstrated as well, small volume. No clear areas of cerebral infarction or vasogenic edema. 2 mm of LEFT-to-RIGHT shift. Vascular: No hyperdense vessel or unexpected calcification. Skull: Normal. Negative for fracture or focal lesion. Sinuses/Orbits: No acute finding. Other: None. IMPRESSION: Stable LEFT frontal mixed attenuation collections, as described above. 2 mm LEFT-to-RIGHT shift. I believe the collections could both be extra-axial, representing subdural/epidural hematoma/hygroma. Continued surveillance warranted. Patient could not cooperate for MRI. If clinically indicated, repeat could be performed with sedation. Electronically Signed   By: Staci Righter M.D.   On: 01/21/2017 10:18   Ct Head Wo Contrast  Addendum  Date: 01/20/2017   ADDENDUM REPORT: 01/20/2017 16:18 ADDENDUM: Study discussed by telephone with Dr. Evette Doffing on the Internal Medicine Team On 01/20/2017 at 1545 hours. He  advised that the patient has been hospitalized in the ICU and on heparin recently for a fib. This factor argues against the possibility of venous thrombosis, and in favor of a coagulopathy related bleed. No recent trauma. He plans a follow-up Brain MRI without and with contrast (I advised contrast even though the patient is recently on dialysis) when feasible. Electronically Signed   By: Genevie Ann M.D.   On: 01/20/2017 16:18   Result Date: 01/20/2017 CLINICAL DATA:  61 year old female with mental status changes since this morning and increased confusion throughout the day. Dialysis patient. EXAM: CT HEAD WITHOUT CONTRAST TECHNIQUE: Contiguous axial images were obtained from the base of the skull through the vertex without intravenous contrast. COMPARISON:  Head CT without contrast 10/02/2009 FINDINGS: Brain: In 2010 the left frontal lobe appeared within normal limits. Now there is both a 3 cm oval area of hypodensity within the anterior left frontal lobe (series 3, image 17), and an adjacent mixed density rounded hemorrhage or mass like area of about 2.6 cm. There does appear to be a small volume of superimposed left parasagittal subdural hemorrhage up to 7 mm in thickness. There is not an area of vasogenic edema in the left frontal lobe. Outside of the mixed density abnormality in the anterior left frontal lobe gray and white matter differentiation appears normal throughout the brain. No significant intracranial mass effect. No ventriculomegaly. Normal basilar cisterns. Partially empty sella. No cortical encephalomalacia identified. Vascular: Aside from hyperdensity involving the left superior sagittal sinus at the area of presumed subdural blood there is no suspicious intracranial vascular hyperdensity. Skull: Hyperostosis of the calvarium. Small 6-7 mm oval lucent lesions in the left occipital bone (series 4, image 24) and left frontal bone (image 44) are identified. However, the left frontal bone lesion is  unchanged from 2010, and the left occipital bone lesion was probably present at that time although much smaller. No definite destructive osseous lesion. Sinuses/Orbits: Visualized paranasal sinuses and mastoids are stable and well pneumatized. Other: No acute orbit or scalp soft tissue finding. IMPRESSION: 1. Unusual appearing mixed density mass like area along the surface of the anterior frontal lobe, with cytotoxic type edema as well as a rounded 2 cm area of hemorrhage. Apparent small volume superimposed nearby a left parafalcine subdural blood. 2. Etiology is unclear at this point with top differential considerations including: trauma such as Hemorrhagic Contusion and SDH, Venous Infarct (superior cortical vein and/or short segment sagittal sinus thrombosis), and tumor. 3. Negative CT appearance of the brain elsewhere. 4. Recommend follow-up brain MRI, preferably without and with IV contrast, to further characterize. 5. Small lucent areas in the skull appear chronic and are probably benign - possibly hyperparathyroid or renal osteodystrophy related in the setting of chronic renal failure. Electronically Signed: By: Genevie Ann M.D. On: 01/20/2017 15:39   Mr Jodene Nam Head Wo Contrast  Result Date: 01/22/2017 CLINICAL DATA:  Subdural hematoma following recent cardioversion. EXAM: MRA HEAD WITHOUT CONTRAST TECHNIQUE: Angiographic images of the Circle of Willis were obtained using MRA technique without intravenous contrast. COMPARISON:  MRI brain 01/21/2017 FINDINGS: Source images again demonstrate the left extra-axial hemorrhages. The internal carotid arteries are within normal limits from the high cervical segments through the ICA termini bilaterally. 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 The A1 and M1 segments are  normal. The right A1 segment is dominant. The anterior communicating artery is patent. The MCA bifurcations are intact. ACA and MCA branch vessels are within normal limits. The left  vertebral artery slightly dominant to the right. The left PICA origin is below normal. The vertebrobasilar junction is normal. The right AICA is dominant. The basilar artery is within normal limits. The posterior cerebral arteries originate the basilar tip. PCA branch vessels are unremarkable. IMPRESSION: 1. 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 2. Otherwise normal MRA circle of Willis without significant proximal stenosis or occlusion. No other aneurysms are present. Electronically Signed   By: San Morelle M.D.   On: 01/22/2017 08:38   Mr Brain Wo Contrast  Result Date: 01/21/2017 CLINICAL DATA:  Altered mental status.  Intracranial hemorrhage. EXAM: MRI HEAD WITHOUT CONTRAST TECHNIQUE: Multiplanar, multiecho pulse sequences of the brain and surrounding structures were obtained without intravenous contrast. COMPARISON:  Head CT 01/21/2017 FINDINGS: The examination had to be discontinued prior to completion, and axial T1 and coronal T2 sequences were not obtained. Brain: Left-sided subdural hematoma is greatest over the frontal convexity and measures up to 12 mm in thickness, unchanged from the CT earlier today. The more anterior component is T1, T2, and FLAIR hyperintense and simpler in appearance, corresponding to the low-density fluid on CT. At the posterior aspect of this are more complex appearing blood products, corresponding to the hyperdense hemorrhage on CT. Minimal subdural hematoma extends posteriorly over the left cerebral convexity as well as into the middle cranial fossa over the temporal lobe and into the interhemispheric fissure. There is also trace subdural hematoma over the anterior right frontal lobe. The left-sided hematoma results in mild left cerebral hemispheric sulcal effacement and minimal rightward midline shift of 3 mm, unchanged. There is no acute infarct. No significant cerebral white matter disease or brain edema is identified. Cerebral volume is within normal  limits for age. Vascular: Major intracranial vascular flow voids are preserved. Skull and upper cervical spine: Decreased bone marrow signal intensity likely reflects known underlying anemia. Sinuses/Orbits: Unremarkable orbits. Fluid in the nasopharynx. Clear paranasal sinuses. Trace bilateral mastoid fluid. Other: None. IMPRESSION: 1. Slightly incomplete noncontrast brain MRI. 2. Left-sided subdural hematoma, unchanged from earlier CT. Trace rightward midline shift. 3. Trace right frontal subdural hematoma. 4. No acute infarct. Electronically Signed   By: Logan Bores M.D.   On: 01/21/2017 15:53   Dg Chest Port 1 View  Result Date: 01/22/2017 CLINICAL DATA:  Ventilator dependent EXAM: PORTABLE CHEST 1 VIEW COMPARISON:  01/21/2017 FINDINGS: Endotracheal tube, central venous lines and NG tube are unchanged. Stable enlarged cardiac silhouette. Mild central venous congestion similar prior. LEFT basilar atelectasis is increased. IMPRESSION: 1. Increase in LEFT basilar atelectasis. 2. Central venous pulmonary congestion similar prior. 3. Stable support apparatus. Electronically Signed   By: Suzy Bouchard M.D.   On: 01/22/2017 07:40   Dg Chest Port 1 View  Result Date: 01/21/2017 CLINICAL DATA:  Central line placement EXAM: PORTABLE CHEST 1 VIEW COMPARISON:  Portable exam 1300 hours compared 01/21/2017 FINDINGS: Tip of endotracheal tube projects 4.1 cm above carina. Nasogastric tube extends into stomach. RIGHT jugular dual-lumen catheter projects over cavoatrial junction. New RIGHT jugular central venous catheter with tip projecting over SVC. EKG leads project over chest. Enlargement of cardiac silhouette with pulmonary vascular congestion. Atherosclerotic calcification aorta. Slightly improved interstitial edema. No pleural effusion or pneumothorax. IMPRESSION: No pneumothorax following LEFT jugular line placement. Slightly improved pulmonary edema. Electronically Signed   By: Elta Guadeloupe  Thornton Papas M.D.   On:  01/21/2017 13:16   Dg Chest Port 1 View  Result Date: 01/21/2017 CLINICAL DATA:  Shortness of Breath EXAM: PORTABLE CHEST 1 VIEW COMPARISON:  01/17/2017 FINDINGS: Cardiac shadow remains enlarged. Dialysis catheter is again seen. The left jugular central line is been removed. The lungs are well aerated bilaterally. No focal infiltrate or sizable effusion is seen. Mild fullness of the pulmonary vasculature is noted likely related to a degree of volume overload. No bony abnormality is seen. IMPRESSION: Mild volume overload likely related to the underlying Clinical state. No new focal abnormality is seen. Electronically Signed   By: Inez Catalina M.D.   On: 01/21/2017 10:03   Dg Abd Portable 1v  Result Date: 01/22/2017 CLINICAL DATA:  Orogastric tube placement.  Initial encounter. EXAM: PORTABLE ABDOMEN - 1 VIEW COMPARISON:  CT of the abdomen and pelvis performed 03/08/2016 FINDINGS: The patient's enteric tube is noted ending overlying the body of the stomach. The visualized bowel gas pattern is unremarkable. Scattered air and stool filled loops of colon are seen; no abnormal dilatation of small bowel loops is seen to suggest small bowel obstruction. No free intra-abdominal air is identified, though evaluation for free air is limited on a single supine view. The visualized osseous structures are within normal limits; the sacroiliac joints are unremarkable in appearance. IMPRESSION: Enteric tube noted ending overlying the body of the stomach. Electronically Signed   By: Garald Balding M.D.   On: 01/22/2017 00:20   Mr Mrv Head Wo Cm  Result Date: 01/22/2017 CLINICAL DATA:  Extra-axial hemorrhage following recent cardioversion. EXAM: MR VENOGRAM the HEAD WITHOUT CONTRAST TECHNIQUE: Angiographic images of the intracranial venous structures were obtained using MRV technique without intravenous contrast. COMPARISON:  MRI brain 01/21/2017.  MRA head from the same date. FINDINGS: The superior sagittal sinus is  patent. The straight sinus and deep cerebral veins are patent. The transverse and sigmoid sinuses are codominant and patent bilaterally. The cortical veins are unremarkable. The source images demonstrates a stable appearance of the extra-axial hemorrhages. IMPRESSION: Negative MR venogram. Electronically Signed   By: San Morelle M.D.   On: 01/22/2017 08:40    Scheduled: . ampicillin (OMNIPEN) IV  2 g Intravenous Q12H  . cefTRIAXone (ROCEPHIN)  IV  2 g Intravenous Q12H  . chlorhexidine gluconate (MEDLINE KIT)  15 mL Mouth Rinse BID  . Chlorhexidine Gluconate Cloth  6 each Topical Daily  . darbepoetin (ARANESP) injection - NON-DIALYSIS  150 mcg Subcutaneous Q Tue-1800  . famotidine (PEPCID) IV  20 mg Intravenous Q24H  . fluticasone  1 spray Each Nare BID  . mouth rinse  15 mL Mouth Rinse QID  . midodrine  10 mg Oral TID WC  . phytonadione (VITAMIN K) IV  5 mg Intravenous Q breakfast     LOS: 27 days   Elesha Thedford C 01/22/2017,10:02 AM

## 2017-01-22 NOTE — Progress Notes (Signed)
Initial Nutrition Assessment  DOCUMENTATION CODES:   Morbid obesity  INTERVENTION:  If unable to extubate, Recommend TF with Vital High Protein at goal rate of 50 ml/h (1200 ml per day) and Prostat 60 ml BID to provide 1600 kcals, 165 gm protein, 1008 ml free water daily.  NUTRITION DIAGNOSIS:   Inadequate oral intake related to inability to eat as evidenced by NPO status.  GOAL:   Patient will meet greater than or equal to 90% of their needs  MONITOR:   Vent status, Weight trends, Labs, I & O's, Skin  REASON FOR ASSESSMENT:   Ventilator    ASSESSMENT:   61 year old woman with poorly controlled OHS, chronic combined systolic and diastolic CHF, a-fib/a-flutter on chronic anticoagulation, hypertension, COPD, CKD Stage 3. Admitted to ED from [facility] on 3/18 for  acute decompensated CHF complicated by cardiorenal syndrome. During stay developed acute on chronic kidney disease requiring CRRT, now being followed by nephrology for PRN HD. Blood culture positive for VRE bacteremia being treated with ampicillin and ceftriaxone. Transferred to step-down on 4/11. On 4/12 noted to be more lethargic and aphasic. CT Head revealed mixed density mass like area along the surface of the anterior frontal lobe with cytotoxic edema as well as a rounded 2 cm hemorrhage. Pt intubated 4/13.  Patient is currently intubated on ventilator support MV: 11 L/min Temp (24hrs), Avg:99 F (37.2 C), Min:98.7 F (37.1 C), Max:99.2 F (37.3 C)  Propofol: none  No family at bedside. Meal completion prior to NPO status was varied from 40-100% with 100% po on 4/12. Pt with weight loss since admission, which is likely related to fluid status as pt was previously on CRRT and PRN HD. Per MD note, pt with AKI on CKD and plans to restart CRRT.   Pt with no observed significant fat or muscle mass loss.   Labs and medications reviewed. Phosphorous elevated at 5.8.  Diet Order:  Diet NPO time specified  Skin:   Reviewed, no issues  Last BM:  4/13  Height:   Ht Readings from Last 1 Encounters:  01/21/17 5\' 8"  (1.727 m)    Weight:   Wt Readings from Last 1 Encounters:  01/22/17 293 lb 3.4 oz (133 kg)    Ideal Body Weight:  63.6 kg  BMI:  Body mass index is 44.58 kg/m.  Estimated Nutritional Needs:   Kcal:  [phone]  Protein:  >/= 159 grams  Fluid:  Per MD  EDUCATION NEEDS:   No education needs identified at this time  Corrin Parker, MS, RD, LDN Pager # [phone] After hours/ weekend pager # [phone]

## 2017-01-22 NOTE — Progress Notes (Signed)
Family at bedside and requesting results from MRIs from yesterday and this morning. Dr. Leonie Man, on call neurology stroke MD, paged. Awaiting return call at this time. Family updated I am trying to reach someone to get them updates.  Paige Marshall

## 2017-01-22 NOTE — Progress Notes (Signed)
Pharmacy Antibiotic Note  Paige Marshall is a 61 y.o. female admitted on 12/26/2016 with VRE bacteremia.  Pharmacy has been consulted for ampicillin and ceftriaxone dosing.  Patient starting CRRT today, pharmacy asked to adjust antibiotic doses accordingly.  Plan: 1. Increase ampicillin to 2g IV q 8 hrs. 2. Continue ceftriaxone 2g IV q 12 hrs. 3. Monitor tolerance of CRRT, and duration.  Height: 5\' 8"  (172.7 cm) Weight: 293 lb 3.4 oz (133 kg) IBW/kg (Calculated) : 63.9  Temp (24hrs), Avg:99 F (37.2 C), Min:98.7 F (37.1 C), Max:99.2 F (37.3 C)   Recent Labs Lab 01/18/17 0333  01/19/17 0547  01/20/17 0358 01/20/17 1509 01/21/17 0444 01/21/17 0445 01/21/17 1822 01/22/17 0412 01/22/17 0751  WBC 10.2  --  8.0  --  8.3  --   --  7.6  --  9.9  --   CREATININE 2.90*  < > 4.24*  < > 5.54* 6.13* 6.72*  --  7.09* 7.45*  --   LATICACIDVEN  --   --   --   --   --   --   --   --   --   --  1.4  < > = values in this interval not displayed.  Estimated Creatinine Clearance: 11.9 mL/min (A) (by C-G formula based on SCr of 7.45 mg/dL (H)).    Allergies  Allergen Reactions  . Penicillins Hives, Itching, Swelling and Other (See Comments)    Tongue swelling Has patient had a PCN reaction causing immediate rash, facial/tongue/throat swelling, SOB or lightheadedness with hypotension: Yes  Clarified with the patient her penicillin allergy. Pt has tolerated cefepime and ampicillin.    . Citrus Hives    Antimicrobials this admission:  3/31 vanc >4/1 3/31 cefepime >4/1 4/1 Dapto> 4/3 4/3 Amp > (4/18) 4/5 CTX>> (4/18)  Dose adjustments this admission:  4/14 > ampicillin dose adjusted for CRRT; ceftriaxone dose appropriate  Microbiology results:  4/1 BCx: 2/2 VRE (amp sens) 4/2 BCx x 2: ngF 3/19 MRSA PCR neg  Thank you for allowing pharmacy to be a part of this patient's care.  Uvaldo Rising, BCPS  Clinical Pharmacist Pager [phone]  01/22/2017 11:10  AM

## 2017-01-22 NOTE — Procedures (Signed)
EEG Report  Clinical History:  Altered mental status in patient with renal failure and infection  Technical summary:  A 19 channel digital EEG recording was performed using the 10-20 international system of electrode placement.  Bipolar and referential montages were used.  The total recording time was approx 20 minutes.    EEG description:  The recording is very low voltage and analyzed at 3uV/mm.  Generalized background frequencies are in the 3-4 Hz range.  There are periodic triphasic waveforms, more prominent anteriorly and symmetrical.  There are no epileptiform discharges or electrographic seizures present.    Impression:  This is an abnormal EEG due to severe generalized suppressed and slowing of brain activity and characteristic waveforms consistent with a metabolic encephalopathy.  The patient is not in non-convulsive status epilepticus.     Rogue Jury, MS, MD

## 2017-01-22 NOTE — Progress Notes (Signed)
Pt taken to MRI w/o complications, during transport trip back pt's arterial line positional. When approiate results varied from sbp 40-70 with a MAP in 30-60 range. Upon arrival to unit radial aline pulled, femoral place by CCM MD, Epi gtt started per HF MD. Pt tolerated all and BP eventually rebounded within parameters. MD at bedside. Orders received. Will continue to monitor. Eleonore Chiquito RN 2 Heart

## 2017-01-22 NOTE — Progress Notes (Signed)
Dr. Erlinda Hong, neurology, on unit and made aware patients family requesting updates. Dr. Erlinda Hong updated family at bedside and all questions answered.   Paige Marshall

## 2017-01-22 NOTE — Progress Notes (Addendum)
[name], female   DOB: 01-Apr-1956, 61 y.o.   MRN: [id number]     Advanced Heart Failure Rounding Note  PCP: Kerin Perna, NP  Primary Cardiologist: Dr. Gwenlyn Found   Subjective:    s/p failed DCCV 3/28 and 4/5. Remains in Afib/AFL in 110s. Amio switched to po 01/18/17 after > 16 g load of IV.     In am of 01/20/17 was thought to be mildly confused from baseline, thought to be 2/2 worsening renal function.  Pt underwent DCCV with successful return to NSR.   Pt noted to be difficult to rouse from sedation, and with on-going lethargy and "garbled speech" CT head ordered which showed left frontal intracranial hemorrhage.  Heparin/coumadin stopped.    MRI head 4/13 showed left SDH, trace rightward midline shift.  No CVA.  She went down for MRA/MRV this morning.   Intubated 4/13 for airway control.  Increased pressor requirement, currently on norepinephrine 32 but unable to get BP currently (arterial line not working and difficult for cuff with body habitus).     CVVHD stopped 01/16/17. Creatinine 3.66->4.24->4.99->5.54 -> 6.72 -> 7.45. She has been and remains anuric.  HCO3 18.    Objective:   Weight Range: 288 lb 12.8 oz (131 kg) Body mass index is 43.91 kg/m.   Vital Signs:   Temp:  [97.5 F (36.4 C)-99.2 F (37.3 C)] 99.2 F (37.3 C) (04/14 0359) Pulse Rate:  [63-87] 70 (04/14 0530) Resp:  [14-24] 21 (04/14 0530) SpO2:  [85 %-100 %] 100 % (04/14 0530) Arterial Line BP: (66-145)/(27-82) 131/63 (04/14 0530) FiO2 (%):  [50 %-100 %] 50 % (04/14 0010) Last BM Date: 01/21/17  Weight change: Filed Weights   01/20/17 0632 01/21/17 0315 01/21/17 0530  Weight: 288 lb 12.8 oz (131 kg) 285 lb (129.3 kg) 288 lb 12.8 oz (131 kg)    Intake/Output:   Intake/Output Summary (Last 24 hours) at 01/22/17 0658 Last data filed at 01/22/17 0600  Gross per 24 hour  Intake          1664.49 ml  Output                0 ml  Net          1664.49 ml     Physical  Exam: General: Obese AA female.  Intubated/sedated    HEENT: Normocephalic, atraumatic.  Neck: Thick. LIJ in place. JVP 8-9   Cor: Distant.  Regular. PMI non-palpable.  No M/G/R appreciated.    Lungs: Diminished throughout.   Abdomen: Obese, ND, no HSM. No bruits or masses. +BS   Extremities: No cyanosis, clubbing, rash or edema. Cool to the touch.   Neuro: Not responsive currently.   Telemetry: Personally reviewed, NSR 70s     Labs: CBC  Recent Labs  01/21/17 0445 01/22/17 0412  WBC 7.6 9.9  HGB 8.9* 9.4*  HCT 30.0* 31.2*  MCV 90.4 88.9  PLT 153 409*   Basic Metabolic Panel  Recent Labs  01/21/17 0445 01/21/17 1822 01/22/17 0412  NA  --  133* 133*  K  --  4.4 4.4  CL  --  97* 97*  CO2  --  17* 18*  GLUCOSE  --  113* 112*  BUN  --  35* 37*  CREATININE  --  7.09* 7.45*  CALCIUM  --  9.7 9.7  MG 2.4  --  2.3  PHOS  --  6.2* 5.8*    BNP: BNP (last 3 results)  Recent Labs  09/18/16 1126 11/07/16 1633 12/26/16 1643  BNP 991.0* 490.4* 1,116.0*    Medications:     Scheduled Medications: . ampicillin (OMNIPEN) IV  2 g Intravenous Q12H  . cefTRIAXone (ROCEPHIN)  IV  2 g Intravenous Q12H  . chlorhexidine gluconate (MEDLINE KIT)  15 mL Mouth Rinse BID  . Chlorhexidine Gluconate Cloth  6 each Topical Daily  . darbepoetin (ARANESP) injection - NON-DIALYSIS  150 mcg Subcutaneous Q Tue-1800  . famotidine (PEPCID) IV  20 mg Intravenous Q24H  . fluticasone  1 spray Each Nare BID  . mouth rinse  15 mL Mouth Rinse QID  . midodrine  10 mg Oral TID WC    Infusions: . amiodarone 30 mg/hr (01/22/17 0600)  . epinephrine    . fentaNYL infusion INTRAVENOUS 50 mcg/hr (01/22/17 0600)  . norepinephrine (LEVOPHED) Adult infusion 32 mcg/min (01/22/17 0600)  . phenylephrine (NEO-SYNEPHRINE) Adult infusion Stopped (01/21/17 1000)    PRN Medications: Place/Maintain arterial line **AND** sodium chloride, acetaminophen, diphenhydrAMINE-zinc acetate, fentaNYL, midazolam,  senna-docusate, sodium chloride  Assessment/Plan   1. Acute on chronic systolic/diastolic CHF: Down > 803 lbs with CVVHD.  EF 25-30% on TEE 4/5 (down from 55% in 3/18) -> may be tachy-mediated CMP from AF.  - No BB, ACE/ARB/ARNI with low BP,  AKI.     - Currently hypotensive on norepinephrine 32 (though unable to get BP this morning).  Placing femoral arterial line.  If she is truly hypotensive on the norepinephrine, would add epinephrine given acidosis. 2. Left frontal subdural hematoma: MRI shows trace right midline shift, no CVA.  She went down this am for MRA/MRV, not read yet.  She is intubated and not responsive.  3. Paroxysmal atrial fibrillation: s/p DC-CV on 3/27 and failed again on 01/13/17. s/p successful DCCV to NSR on 01/20/17.  Remains in NSR, off anticoagulation with SDH.  - Continue amiodarone gtt.  4. AKI on CKD:  CVVHD stopped 01/16/17.  She continues to be anuric.  Metabolic acidosis with HCO3 18.  As acidosis proceeds, it is going to be harder and harder to keep her BP up.  Think CVVH for solute removal would be helpful if we can keep BP up enough.  - Awaiting nephrology input this am.   5. Obesity hypoventilation syndrome.  6. Hyperkalemia:  Stable. K is 4.4. 7. 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 No change.   8. Acute on chronic respiratory failure: She was intubated for airway control. 9. Anemia: Hgb 8.2. Anticoags held with head bleed.  10. NSVT: Quiescent. Electrolytes stable.  No change.  11. Septic Shock- Blood CX--> VRE - Repeat BCx negative for 5 days. ID following.  Ceftriaxone/ampicillin to 4/18.  - TEE 01/13/17 without endocarditis. No change.   Loralie Champagne, MD  6:58 AM   Advanced Heart Failure Team  Pager [phone] M-F 7am-4pm.  Please contact [facility] for night-coverage after hours (4p -7a ) and weekends on [email or web address]

## 2017-01-23 ENCOUNTER — Inpatient Hospital Stay (HOSPITAL_COMMUNITY): Payer: Medicare Other

## 2017-01-23 DIAGNOSIS — B952 Enterococcus as the cause of diseases classified elsewhere: Secondary | ICD-10-CM

## 2017-01-23 DIAGNOSIS — I482 Chronic atrial fibrillation: Secondary | ICD-10-CM

## 2017-01-23 DIAGNOSIS — Z1621 Resistance to vancomycin: Secondary | ICD-10-CM

## 2017-01-23 DIAGNOSIS — R7881 Bacteremia: Secondary | ICD-10-CM

## 2017-01-23 DIAGNOSIS — Z1639 Resistance to other specified antimicrobial drug: Secondary | ICD-10-CM

## 2017-01-23 DIAGNOSIS — N170 Acute kidney failure with tubular necrosis: Secondary | ICD-10-CM

## 2017-01-23 DIAGNOSIS — N179 Acute kidney failure, unspecified: Secondary | ICD-10-CM

## 2017-01-23 LAB — BLOOD GAS, ARTERIAL
Acid-base deficit: 5.4 mmol/L — ABNORMAL HIGH (ref 0.0–2.0)
Bicarbonate: 19.6 mmol/L — ABNORMAL LOW (ref 20.0–28.0)
Drawn by: 10006
FIO2: 40
LHR: 20 {breaths}/min
O2 Saturation: 92.3 %
PATIENT TEMPERATURE: 98.6
PEEP/CPAP: 5 cmH2O
VT: 510 mL
pCO2 arterial: 39.6 mmHg (ref 32.0–48.0)
pH, Arterial: 7.316 — ABNORMAL LOW (ref 7.350–7.450)
pO2, Arterial: 70.3 mmHg — ABNORMAL LOW (ref 83.0–108.0)

## 2017-01-23 LAB — PROTIME-INR
INR: 1.38
PROTHROMBIN TIME: 17.1 s — AB (ref 11.4–15.2)

## 2017-01-23 LAB — RENAL FUNCTION PANEL
Albumin: 2.3 g/dL — ABNORMAL LOW (ref 3.5–5.0)
Albumin: 2.2 g/dL — ABNORMAL LOW (ref 3.5–5.0)
Anion gap: 15 (ref 5–15)
Anion gap: 17 — ABNORMAL HIGH (ref 5–15)
BUN: 31 mg/dL — AB (ref 6–20)
BUN: 25 mg/dL — ABNORMAL HIGH (ref 6–20)
CHLORIDE: 99 mmol/L — AB (ref 101–111)
CHLORIDE: 98 mmol/L — AB (ref 101–111)
CO2: 20 mmol/L — AB (ref 22–32)
CO2: 21 mmol/L — ABNORMAL LOW (ref 22–32)
CREATININE: 4.26 mg/dL — AB (ref 0.44–1.00)
Calcium: 9 mg/dL (ref 8.9–10.3)
Calcium: 8.8 mg/dL — ABNORMAL LOW (ref 8.9–10.3)
Creatinine, Ser: 5.19 mg/dL — ABNORMAL HIGH (ref 0.44–1.00)
GFR, EST AFRICAN AMERICAN: 10 mL/min — AB (ref >60)
GFR, EST AFRICAN AMERICAN: 12 mL/min — AB (ref >60)
GFR, EST NON AFRICAN AMERICAN: 8 mL/min — AB (ref >60)
GFR, EST NON AFRICAN AMERICAN: 11 mL/min — AB (ref >60)
Glucose, Bld: 103 mg/dL — ABNORMAL HIGH (ref 65–99)
Glucose, Bld: 94 mg/dL (ref 65–99)
POTASSIUM: 4.3 mmol/L (ref 3.5–5.1)
POTASSIUM: 4.3 mmol/L (ref 3.5–5.1)
Phosphorus: 3.5 mg/dL (ref 2.5–4.6)
Phosphorus: 3 mg/dL (ref 2.5–4.6)
Sodium: 134 mmol/L — ABNORMAL LOW (ref 135–145)
Sodium: 136 mmol/L (ref 135–145)

## 2017-01-23 LAB — DIC (DISSEMINATED INTRAVASCULAR COAGULATION)PANEL
Fibrinogen: 311 mg/dL (ref 210–475)
INR: 1.33
Platelets: 73 10*3/uL — ABNORMAL LOW (ref 150–400)
aPTT: 62 seconds — ABNORMAL HIGH (ref 24–36)

## 2017-01-23 LAB — CBC
HCT: 31.7 % — ABNORMAL LOW (ref 36.0–46.0)
Hemoglobin: 10 g/dL — ABNORMAL LOW (ref 12.0–15.0)
MCH: 27.4 pg (ref 26.0–34.0)
MCHC: 31.5 g/dL (ref 30.0–36.0)
MCV: 86.8 fL (ref 78.0–100.0)
PLATELETS: 75 10*3/uL — AB (ref 150–400)
RBC: 3.65 MIL/uL — ABNORMAL LOW (ref 3.87–5.11)
RDW: 26.2 % — AB (ref 11.5–15.5)
WBC: 8.5 10*3/uL (ref 4.0–10.5)

## 2017-01-23 LAB — DIC (DISSEMINATED INTRAVASCULAR COAGULATION) PANEL: PROTHROMBIN TIME: 16.6 s — AB (ref 11.4–15.2)

## 2017-01-23 NOTE — Progress Notes (Signed)
Patient ID: Glendell Docker, female   DOB: 22-Aug-1956, 61 y.o.   MRN: [id number]     Advanced Heart Failure Rounding Note  PCP: Kerin Perna, NP  Primary Cardiologist: Dr. Gwenlyn Found   Subjective:    s/p failed DCCV 3/28 and 4/5. Remains in Afib/AFL in 110s. Amio switched to po 01/18/17 after > 16 g load of IV.     In am of 01/20/17 was thought to be mildly confused from baseline, thought to be 2/2 worsening renal function.  Pt underwent DCCV with successful return to NSR.   Pt noted to be difficult to rouse from sedation, and with on-going lethargy and "garbled speech" CT head ordered which showed left frontal intracranial hemorrhage.  Heparin/coumadin stopped.    MRI head 4/13 showed small left frontal intracerebral hemorrhage with parafalcine small SDH, trace rightward midline shift.  No CVA.  EEG suggestive of metabolic encephalopathy.  Neurology thinks altered mental status not related to ICH/SDH (small).    Intubated 4/13 for airway control.  Required norepinephrine.  She was started yesterday pm on CVVH, since then BP has improved, SBP in 130s this morning.   She remains minimally responsive.   CVVHD stopped 01/16/17. CVVH restarted 4/14.    Objective:   Weight Range: 286 lb 9.6 oz (130 kg) Body mass index is 43.58 kg/m.   Vital Signs:   Temp:  [98 F (36.7 C)-99.2 F (37.3 C)] 98 F (36.7 C) (04/15 0737) Pulse Rate:  [66-126] 71 (04/15 0815) Resp:  [17-26] 20 (04/15 0815) BP: (83-129)/(51-80) 97/57 (04/15 0800) SpO2:  [87 %-100 %] 100 % (04/15 0815) Arterial Line BP: (109-150)/(45-64) 131/51 (04/15 0815) FiO2 (%):  [40 %-50 %] 40 % (04/15 0800) Weight:  [286 lb 9.6 oz (130 kg)] 286 lb 9.6 oz (130 kg) (04/15 0300) Last BM Date: 01/23/17  Weight change: Filed Weights   01/21/17 0530 01/22/17 0800 01/23/17 0300  Weight: 288 lb 12.8 oz (131 kg) 293 lb 3.4 oz (133 kg) 286 lb 9.6 oz (130 kg)    Intake/Output:   Intake/Output Summary (Last 24 hours) at 01/23/17  0823 Last data filed at 01/23/17 0800  Gross per 24 hour  Intake          1744.11 ml  Output             2239 ml  Net          -494.89 ml     Physical Exam: General: Obese AA female.  Intubated/sedated    HEENT: Normocephalic, atraumatic.  Neck: Thick. LIJ in place. JVP 8-9   Cor: Distant.  Regular. PMI non-palpable.  Normal S1S2, no S3S4.    Lungs: Diminished throughout.   Abdomen: Obese, ND, no HSM. No bruits or masses. +BS   Extremities: No cyanosis, clubbing, rash or edema.   Neuro: Not responsive currently.   Telemetry: Personally reviewed, NSR 70s     Labs: CBC  Recent Labs  01/22/17 0412 01/23/17 0404  WBC 9.9 8.5  HGB 9.4* 10.0*  HCT 31.2* 31.7*  MCV 88.9 86.8  PLT 126* 75*   Basic Metabolic Panel  Recent Labs  01/21/17 0445  01/22/17 0412 01/22/17 1546 01/23/17 0404  NA  --   < > 133* 132* 134*  K  --   < > 4.4 4.3 4.3  CL  --   < > 97* 97* 99*  CO2  --   < > 18* 19* 20*  GLUCOSE  --   < > 112*  94 103*  BUN  --   < > 37* 36* 31*  CREATININE  --   < > 7.45* 6.77* 5.19*  CALCIUM  --   < > 9.7 9.2 9.0  MG 2.4  --  2.3  --   --   PHOS  --   < > 5.8* 4.4 3.5  < > = values in this interval not displayed.  BNP: BNP (last 3 results)  Recent Labs  09/18/16 1126 11/07/16 1633 12/26/16 1643  BNP 991.0* 490.4* 1,116.0*    Medications:     Scheduled Medications: . ampicillin (OMNIPEN) IV  2 g Intravenous Q8H  . cefTRIAXone (ROCEPHIN)  IV  2 g Intravenous Q12H  . chlorhexidine gluconate (MEDLINE KIT)  15 mL Mouth Rinse BID  . Chlorhexidine Gluconate Cloth  6 each Topical Daily  . darbepoetin (ARANESP) injection - NON-DIALYSIS  150 mcg Subcutaneous Q Tue-1800  . famotidine (PEPCID) IV  20 mg Intravenous Q24H  . fluticasone  1 spray Each Nare BID  . mouth rinse  15 mL Mouth Rinse 10 times per day  . midodrine  10 mg Oral TID WC  . phytonadione (VITAMIN K) IV  5 mg Intravenous Q breakfast    Infusions: . amiodarone 30 mg/hr (01/23/17 0800)  .  epinephrine Stopped (01/22/17 0953)  . fentaNYL infusion INTRAVENOUS Stopped (01/23/17 0715)  . norepinephrine (LEVOPHED) Adult infusion 24 mcg/min (01/23/17 0800)  . phenylephrine (NEO-SYNEPHRINE) Adult infusion Stopped (01/21/17 1000)  . dialysis replacement fluid (prismasate) 200 mL/hr at 01/22/17 1058  . dialysis replacement fluid (prismasate) 200 mL/hr at 01/22/17 1058  . dialysate (PRISMASATE) 1,000 mL/hr at 01/23/17 0718    PRN Medications: Place/Maintain arterial line **AND** sodium chloride, acetaminophen, diphenhydrAMINE-zinc acetate, fentaNYL, heparin, midazolam, senna-docusate, sodium chloride  Assessment/Plan   1. Acute on chronic systolic/diastolic CHF: Down > 382 lbs with CVVHD initially.  EF 25-30% on TEE 4/5 (down from 55% in 3/18) -> may be tachy-mediated CMP from AF.  - No BB, ACE/ARB/ARNI with low BP,  AKI.     - BP improved now that on CVVH, not acidotic.  Should be able to wean down norepinephrine today.  2. 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  MRA/MRV did not show relevant findings.  EEG showed evidence for metabolic encephalopathy.  Per neuro, ICH/SDH does not explain her degree of altered mental status, suspect metabolic encephalopathy in setting of renal failure played a role.  3. Paroxysmal atrial fibrillation: s/p DC-CV on 3/27 and failed again on 01/13/17. s/p successful DCCV to NSR on 01/20/17.  Remains in NSR, off anticoagulation with SDH.  - Continue amiodarone gtt.  - Will restart anticoagulation when deemed safe by renal.  4. AKI on CKD:  CVVHD stopped 01/16/17.  She was anuric, started back on CVVH 4/14 pm.  BP better this morning (may be with resolution of acidemia).  5. Obesity hypoventilation syndrome.  6. Septic Shock- Blood CX--> VRE - Repeat BCx negative for 5 days. ID following.  Ceftriaxone/ampicillin to 4/18.  - TEE 01/13/17 without endocarditis. No change.  7. PAH with acute RV failure/cor  pulmonale - Combination WHO group II and III. No change.   8. Acute on chronic respiratory failure: She was intubated for airway control. 9. Anemia: Hgb 10. Anticoags held with head bleed.  10. NSVT: Quiescent. Electrolytes stable.  No change.   Loralie Champagne, MD  8:23 AM   Advanced Heart Failure Team  Pager [phone]  M-F 7am-4pm.  Please contact [facility] for night-coverage after hours (4p -7a ) and weekends on [email or web address]

## 2017-01-23 NOTE — Progress Notes (Signed)
Patient CRRT filter clotted for a 3rd time. Lorrene Reid, MD called and verbal orders to change filter and increase PBP rate to 600. If filter clots again, will initiate citrate protocol.

## 2017-01-23 NOTE — Progress Notes (Signed)
Assessment/plan:  1. Altered mental status:Suspected to be related to acute intracranial hemorrhage plus/minus embolic event. 2. Oliguric Acute kidney injury on chronic kidney disease stage.  On CRRT, no heparin 3.Hypotension: On pressors 4. VDRF  Subjective: Interval History: BP remais soft  Objective: Vital signs in last 24 hours: Temp:  [98 F (36.7 C)-98.8 F (37.1 C)] 98 F (36.7 C) (04/15 0737) Pulse Rate:  [66-91] 76 (04/15 1500) Resp:  [15-25] 16 (04/15 1500) BP: (82-122)/(51-80) 86/58 (04/15 1500) SpO2:  [87 %-100 %] 98 % (04/15 1500) Arterial Line BP: (108-150)/(43-64) 119/54 (04/15 1500) FiO2 (%):  [40 %-50 %] 40 % (04/15 1214) Weight:  [130 kg (286 lb 9.6 oz)] 130 kg (286 lb 9.6 oz) (04/15 0300) Weight change:   Intake/Output from previous day: 04/14 0701 - 04/15 0700 In: 1767.1 [I.V.:1277.1; NG/GT:90; IV Piggyback:400] Out: 2114  Intake/Output this shift: Total I/O In: 548.2 [I.V.:338.2; NG/GT:60; IV Piggyback:150] Out: 983 [Other:983]  General appearance: unresponsive.  Volume status unchaged.  Not responsive to commandsLungs clear   Lab Results:  Recent Labs  01/22/17 0412 01/23/17 0404 01/23/17 0821  WBC 9.9 8.5  --   HGB 9.4* 10.0*  --   HCT 31.2* 31.7*  --   PLT 126* 75* 73*   BMET:  Recent Labs  01/22/17 1546 01/23/17 0404  NA 132* 134*  K 4.3 4.3  CL 97* 99*  CO2 19* 20*  GLUCOSE 94 103*  BUN 36* 31*  CREATININE 6.77* 5.19*  CALCIUM 9.2 9.0   No results for input(s): PTH in the last 72 hours. Iron Studies: No results for input(s): IRON, TIBC, TRANSFERRIN, FERRITIN in the last 72 hours. Studies/Results: Mr Mra Head Wo Contrast  Result Date: 01/22/2017 CLINICAL DATA:  Subdural hematoma following recent cardioversion. EXAM: MRA HEAD WITHOUT CONTRAST TECHNIQUE: Angiographic images of the Circle of Willis were obtained using MRA technique without intravenous contrast. COMPARISON:  MRI brain 01/21/2017 FINDINGS: Source images again  demonstrate the left extra-axial hemorrhages. The internal carotid arteries are within normal limits from the high cervical segments through the ICA termini bilaterally. 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 The A1 and M1 segments are normal. The right A1 segment is dominant. The anterior communicating artery is patent. The MCA bifurcations are intact. ACA and MCA branch vessels are within normal limits. The left vertebral artery slightly dominant to the right. The left PICA origin is below normal. The vertebrobasilar junction is normal. The right AICA is dominant. The basilar artery is within normal limits. The posterior cerebral arteries originate the basilar tip. PCA branch vessels are unremarkable. IMPRESSION: 1. 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 2. Otherwise normal MRA circle of Willis without significant proximal stenosis or occlusion. No other aneurysms are present. Electronically Signed   By: Christopher  Mattern M.D.   On: 01/22/2017 08:38   Mr Brain Wo Contrast  Result Date: 01/21/2017 CLINICAL DATA:  Altered mental status.  Intracranial hemorrhage. EXAM: MRI HEAD WITHOUT CONTRAST TECHNIQUE: Multiplanar, multiecho pulse sequences of the brain and surrounding structures were obtained without intravenous contrast. COMPARISON:  Head CT 01/21/2017 FINDINGS: The examination had to be discontinued prior to completion, and axial T1 and coronal T2 sequences were not obtained. Brain: Left-sided subdural hematoma is greatest over the frontal convexity and measures up to 12 mm in thickness, unchanged from the CT earlier today. The more anterior component is T1, T2, and FLAIR hyperintense and simpler in appearance, corresponding to the low-density fluid on CT. At the   posterior aspect of this are more complex appearing blood products, corresponding to the hyperdense hemorrhage on CT. Minimal subdural hematoma extends posteriorly over the left cerebral convexity as well as  into the middle cranial fossa over the temporal lobe and into the interhemispheric fissure. There is also trace subdural hematoma over the anterior right frontal lobe. The left-sided hematoma results in mild left cerebral hemispheric sulcal effacement and minimal rightward midline shift of 3 mm, unchanged. There is no acute infarct. No significant cerebral white matter disease or brain edema is identified. Cerebral volume is within normal limits for age. Vascular: Major intracranial vascular flow voids are preserved. Skull and upper cervical spine: Decreased bone marrow signal intensity likely reflects known underlying anemia. Sinuses/Orbits: Unremarkable orbits. Fluid in the nasopharynx. Clear paranasal sinuses. Trace bilateral mastoid fluid. Other: None. IMPRESSION: 1. Slightly incomplete noncontrast brain MRI. 2. Left-sided subdural hematoma, unchanged from earlier CT. Trace rightward midline shift. 3. Trace right frontal subdural hematoma. 4. No acute infarct. Electronically Signed   By: Allen  Grady M.D.   On: 01/21/2017 15:53   Dg Chest Port 1 View  Result Date: 01/23/2017 CLINICAL DATA:  Acute respiratory failure. EXAM: PORTABLE CHEST 1 VIEW COMPARISON:  One-view chest x-ray 01/22/2017 FINDINGS: Endotracheal tube is stable, 4.4 cm above the carina. NG tube courses off the inferior border the film. A left IJ line is stable. The right IJ dialysis catheter is stable. The heart is enlarged. Mild edema has slightly increased. Retrocardiac opacification remains. IMPRESSION: 1. Slight increase and mild edema. 2. Stable retrocardiac opacification likely reflecting a combination of atelectasis and effusion. Infection is not excluded. 3. The support apparatus is stable. Electronically Signed   By: Christopher  Mattern M.D.   On: 01/23/2017 07:58   Dg Chest Port 1 View  Result Date: 01/22/2017 CLINICAL DATA:  Ventilator dependent EXAM: PORTABLE CHEST 1 VIEW COMPARISON:  01/21/2017 FINDINGS: Endotracheal tube,  central venous lines and NG tube are unchanged. Stable enlarged cardiac silhouette. Mild central venous congestion similar prior. LEFT basilar atelectasis is increased. IMPRESSION: 1. Increase in LEFT basilar atelectasis. 2. Central venous pulmonary congestion similar prior. 3. Stable support apparatus. Electronically Signed   By: Stewart  Edmunds M.D.   On: 01/22/2017 07:40   Dg Abd Portable 1v  Result Date: 01/22/2017 CLINICAL DATA:  Orogastric tube placement.  Initial encounter. EXAM: PORTABLE ABDOMEN - 1 VIEW COMPARISON:  CT of the abdomen and pelvis performed 03/08/2016 FINDINGS: The patient's enteric tube is noted ending overlying the body of the stomach. The visualized bowel gas pattern is unremarkable. Scattered air and stool filled loops of colon are seen; no abnormal dilatation of small bowel loops is seen to suggest small bowel obstruction. No free intra-abdominal air is identified, though evaluation for free air is limited on a single supine view. The visualized osseous structures are within normal limits; the sacroiliac joints are unremarkable in appearance. IMPRESSION: Enteric tube noted ending overlying the body of the stomach. Electronically Signed   By: Jeffery  Chang M.D.   On: 01/22/2017 00:20   Mr Mrv Head Wo Cm  Result Date: 01/22/2017 CLINICAL DATA:  Extra-axial hemorrhage following recent cardioversion. EXAM: MR VENOGRAM the HEAD WITHOUT CONTRAST TECHNIQUE: Angiographic images of the intracranial venous structures were obtained using MRV technique without intravenous contrast. COMPARISON:  MRI brain 01/21/2017.  MRA head from the same date. FINDINGS: The superior sagittal sinus is patent. The straight sinus and deep cerebral veins are patent. The transverse and sigmoid sinuses are codominant and patent bilaterally. The   cortical veins are unremarkable. The source images demonstrates a stable appearance of the extra-axial hemorrhages. IMPRESSION: Negative MR venogram. Electronically  Signed   By: San Morelle M.D.   On: 01/22/2017 08:40    Scheduled: . ampicillin (OMNIPEN) IV  2 g Intravenous Q8H  . cefTRIAXone (ROCEPHIN)  IV  2 g Intravenous Q12H  . chlorhexidine gluconate (MEDLINE KIT)  15 mL Mouth Rinse BID  . Chlorhexidine Gluconate Cloth  6 each Topical Daily  . darbepoetin (ARANESP) injection - NON-DIALYSIS  150 mcg Subcutaneous Q Tue-1800  . famotidine (PEPCID) IV  20 mg Intravenous Q24H  . fluticasone  1 spray Each Nare BID  . mouth rinse  15 mL Mouth Rinse 10 times per day  . midodrine  10 mg Oral TID WC    LOS: 28 days   Yilin Weedon C 01/23/2017,3:11 PM

## 2017-01-23 NOTE — Progress Notes (Signed)
Dr. Lorrene Reid notified of increasing filter pressures and TMPs on CRRT. Filter changed twice d/t clotting within the past ~12 hours. Order received to increase prefilter rate to 551mL/hr. Order carried out. Continuing to monitor closely.   Vena Austria

## 2017-01-23 NOTE — Progress Notes (Signed)
STROKE TEAM PROGRESS NOTE   SUBJECTIVE (INTERVAL HISTORY) Brother   at the bedside. Updated them regarding pt condition and answered all their questions. Pt still intubated, on fentanyl, poorly responsive.  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    OBJECTIVE Temp:  [98 F (36.7 C)-99.2 F (37.3 C)] 98 F (36.7 C) (04/15 0737) Pulse Rate:  [66-126] 71 (04/15 0815) Cardiac Rhythm: Normal sinus rhythm (04/15 0800) Resp:  [17-26] 20 (04/15 0815) BP: (83-129)/(51-80) 97/57 (04/15 0800) SpO2:  [87 %-100 %] 100 % (04/15 0815) Arterial Line BP: (109-150)/(45-64) 131/51 (04/15 0815) FiO2 (%):  [40 %-50 %] 40 % (04/15 0800) Weight:  [130 kg (286 lb 9.6 oz)] 130 kg (286 lb 9.6 oz) (04/15 0300)  CBC:   Recent Labs Lab 01/22/17 0412 01/23/17 0404  WBC 9.9 8.5  HGB 9.4* 10.0*  HCT 31.2* 31.7*  MCV 88.9 86.8  PLT 126* 75*    Basic Metabolic Panel:   Recent Labs Lab 01/21/17 0445  01/22/17 0412 01/22/17 1546 01/23/17 0404  NA  --   < > 133* 132* 134*  K  --   < > 4.4 4.3 4.3  CL  --   < > 97* 97* 99*  CO2  --   < > 18* 19* 20*  GLUCOSE  --   < > 112* 94 103*  BUN  --   < > 37* 36* 31*  CREATININE  --   < > 7.45* 6.77* 5.19*  CALCIUM  --   < > 9.7 9.2 9.0  MG 2.4  --  2.3  --   --   PHOS  --   < > 5.8* 4.4 3.5  < > = values in this interval not displayed.  Lipid Panel:     Component Value Date/Time   CHOL 167 01/15/2015 0346   TRIG 194 (H) 01/15/2015 0346   HDL 35 (L) 01/15/2015 0346   CHOLHDL 4.8 01/15/2015 0346   VLDL 39 01/15/2015 0346   LDLCALC 93 01/15/2015 0346   HgbA1c:  Lab Results  Component Value Date   HGBA1C  10/02/2009    5.6 (NOTE) The ADA recommends the following therapeutic goal for glycemic control related to Hgb A1c measurement: Goal of therapy: <6.5 Hgb A1c  Reference: American Diabetes Association: Clinical Practice Recommendations  2010, Diabetes Care, 2010, 33: (Suppl  1).   Urine Drug Screen:     Component Value Date/Time   LABOPIA NONE DETECTED 11/15/2009 2107   COCAINSCRNUR NONE DETECTED 11/15/2009 2107   LABBENZ NONE DETECTED 11/15/2009 2107   AMPHETMU NONE DETECTED 11/15/2009 2107   THCU NONE DETECTED 11/15/2009 2107   LABBARB  11/15/2009 2107    NONE DETECTED        DRUG SCREEN FOR MEDICAL PURPOSES ONLY.  IF CONFIRMATION IS NEEDED FOR ANY PURPOSE, NOTIFY LAB WITHIN 5 DAYS.        LOWEST DETECTABLE LIMITS FOR URINE DRUG SCREEN Drug Class       Cutoff (ng/mL) Amphetamine      1000 Barbiturate      200 Benzodiazepine   836 Tricyclics       629 Opiates          300 Cocaine          300 THC              50  Alcohol Level No results found for: [facility]  IMAGING I have personally reviewed the radiological images below and agree with the radiology interpretations.  Ct Head Wo Contrast 01/21/2017 Stable LEFT frontal mixed attenuation collections, as described above. 2 mm LEFT-to-RIGHT shift. I believe the collections could both be extra-axial, representing subdural/epidural hematoma/hygroma. Continued surveillance warranted. Patient could not cooperate for MRI. If clinically indicated, repeat could be performed with sedation.   Mr Jodene Nam Head Wo Contrast 01/22/2017 1. 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  2. Otherwise normal MRA circle of Willis without significant proximal stenosis or occlusion.  No other aneurysms are present.   Mr Brain Wo Contrast 01/21/2017 1. Slightly incomplete noncontrast brain MRI.  2. Left-sided subdural hematoma, unchanged from earlier CT. Trace rightward midline shift.  3. Trace right frontal subdural hematoma.  4. No acute infarct.   Mr Mrv Head Wo Cm 01/22/2017 Negative MR venogram.   Dg Chest Port 1 View 01/22/2017 1. Increase in LEFT basilar atelectasis.  2. Central venous pulmonary congestion similar prior.  3. Stable support apparatus.   Dg Chest  Port 1 View 01/23/2017 1. Slight increase and mild edema. 2. Stable retrocardiac opacification likely reflecting a combination of atelectasis and effusion. Infection is not excluded. 3. The support apparatus is stable.   TTE 01/11/2017 - Left ventricle: The cavity size was mildly dilated. Wall   thickness was increased in a pattern of mild LVH. Left   ventricular geometry showed evidence of eccentric hypertrophy.   Systolic function was moderately reduced. The estimated ejection   fraction was in the range of 35% to 40%. Moderate diffuse   hypokinesis with no identifiable regional variations. Acoustic   contrast opacification revealed no evidence ofthrombus. - Ventricular septum: The contour showed diastolic flattening.   These changes are consistent with RV volume overload. - Aortic valve: There was mild to moderate regurgitation directed   centrally in the LVOT. - Mitral valve: There was mild to moderate regurgitation directed   eccentrically and posteriorly. - Left atrium: The atrium was moderately dilated. - Right ventricle: The cavity size was severely dilated. Wall   thickness was normal. Systolic function was mildly reduced. - Right atrium: The atrium was severely dilated. - Tricuspid valve: There was malcoaptation of the valve leaflets.   There was moderate-severe regurgitation. - Pulmonary arteries: Systolic pressure was severely increased. PA   peak pressure: 71 mm Hg (S). - Pericardium, extracardiac: A trivial pericardial effusion was   identified posterior to the heart.  EEG  01/22/2017 This is an abnormal EEG due to severe generalized suppressed and slowing of brain activity and characteristic waveforms consistent with a metabolic encephalopathy.  The patient is not in non-convulsive status epilepticus.     PHYSICAL EXAM  Temp:  [98 F (36.7 C)-99.2 F (37.3 C)] 98 F (36.7 C) (04/15 0737) Pulse Rate:  [66-126] 71 (04/15 0815) Resp:  [17-26] 20 (04/15  0815) BP: (83-129)/(51-80) 97/57 (04/15 0800) SpO2:  [87 %-100 %] 100 % (04/15 0815) Arterial Line BP: (109-150)/(45-64) 131/51 (04/15 0815) FiO2 (%):  [40 %-50 %] 40 % (04/15 0800) Weight:  [130 kg (286 lb 9.6 oz)] 130 kg (286 lb 9.6 oz) (04/15 0300)  General - obese, well developed, intubated on sedation.  Ophthalmologic - Fundi not visualized  Cardiovascular - Regular rate and rhythm.  Neuro - intubated on sedation, slight open eyes on voice and pain, not following commands. Does respond to stimulation by opening eyes but not blinking to visual threat, not tracking.  PERRL, doll's eye present, positive corneal and gag. Resume over the vent. On pain stimulation no movement on both are per extremities, however mild withdrawal on bilateral lower extremities. DTR diminished and no Babinski. Sensation, coordination, and gait not tested.    ASSESSMENT/PLAN Ms. MATY ZEISLER is a 61 y.o. female with history ofA. fib on Coumadin, stroke, CHF, CK D, HTN, HLD, obesity, cardiomyopathy presenting with Pulmonary edema, respiratory distress and fluid overload. Received cardioversion, however postprocedure found to have AMS and aphasia. Was on Coumadin with heparin IV bridge, INR 1.74. CT found to have left frontal ICH and parafalcine SDH.   ICH and SDH:  Left frontal small ICH with parafalcine small SDH, etiology likely from trauma and warfarin related subdural hematoma acute and chronic components.. . Pt had fall in 09/2016.  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   MRV - negative  2D Echo - EF 35-40% - no cardiac source of emboli identified.  EEG - severe generalized suppressed and slowing of brain activity and characteristic waveforms consistent with a metabolic encephalopathy.    SCDs for VTE prophylaxis Diet NPO time specified  heparin IV and warfarin daily  discontinued and INR reversed, now on No antithrombotic  Ongoing aggressive stroke risk factor management  Therapy recommendations:  Pending   Disposition:  Pending  Atrial fibrillation   Was on coumadin with heparin bridge  INR 1.74, reversed with VitK and kcentra  Hold off anticoagulation for now  May consider resume anticoagulation once blood absorbed and pt stabilized  On amiodarone IV  AMS  The degree of AMS cannot be explained by CT/MRI findings  Multifactorial, likely due to worsening renal function, bacteremia, sepsis and CHF  Nephrology and ID on board  On ampicillin, Rocephin  EEG - abnormal EEG due to severe generalized suppressed and slowing of brain activity and characteristic waveforms consistent with a metabolic encephalopathy.   Ammonia level slightly elevated at 40  Hypotension on Midrin, Levophed, and Neo BP goal < 140 due to SDH  AKI on CKD  Cre worsening - 7.45 -> 6.77 -> 5.19  Nephrology on board  On CRRT today  VRE bacteremia / sepsis  Blood Cx showed VRE enterococcus  ID on board  On ampicillin and rocephine  BP low on pressors  Other Active Problems  CHF  Cardiomyopathy EF 35-40%  Obesity  OSA  Anemia - stable - 10 / 31.7  Thrombocytopenia - 75 K  Na - 133 -> 132 -> 134  Hospital day # 28 I had a long d/w family at beside and answered questions about prognosis This patient is critically ill due to left frontal and parafalcine SDH/hygroma, AMS, afib not on AC and at significant risk of neurological worsening, death form hematoma expansion, brain herniation, stroke, heart failure, sepsis, hypotension. This patient's care requires constant monitoring of vital signs, hemodynamics, respiratory and cardiac monitoring, review of multiple databases, neurological assessment, discussion with family, other specialists and medical decision making of high complexity. I spent 35 minutes of neurocritical care time in the care of this  patient.   Antony Contras, MD  [facility] [address] [place], [place] [address]  Phone [phone] Fax [phone]  To contact Stroke Continuity provider, please refer to [email or web address] After hours, contact General Neurology

## 2017-01-23 NOTE — Progress Notes (Signed)
PULMONARY / CRITICAL CARE MEDICINE   Name: Paige Marshall MRN: [id number] DOB: [birth year]    ADMISSION DATE:  12/26/2016 CONSULTATION DATE:  01/20/2017  REFERRING MD:  Dr. Evette Doffing   CHIEF COMPLAINT:  AMS  Brief:   61 year old woman with poorly controlled OHS, chronic combined systolic and diastolic CHF, a-fib/a-flutter on chronic anticoagulation, hypertension, COPD, CKD Stage 3. Admitted to ED from [facility] on 3/18 for  acute decompensated CHF complicated by cardiorenal syndrome. During stay developed acute on chronic kidney disease requiring CRRT, now being followed by nephrology for PRN HD. Blood culture positive for VRE bacteremia being treated with ampicillin and ceftriaxone. Transferred to step-down on 4/11. On 4/12 noted to be more lethargic and aphasic. CT Head revealed mixed density mass like area along the surface of the anterior frontal lobe with cytotoxic edema as well as a rounded 2 cm hemorrhage.  SUBJECTIVE:  Wean off epinephrine drip Levophed being titrated down Started on CVVH  VITAL SIGNS: BP (!) 97/57 (BP Location: Right Arm)   Pulse 72   Temp 98 F (36.7 C) (Axillary)   Resp 20   Ht 5\' 8"  (1.727 m)   Wt 286 lb 9.6 oz (130 kg)   LMP 09/07/2011   SpO2 100%   BMI 43.58 kg/m   HEMODYNAMICS: CVP:  [8 mmHg-9 mmHg] 8 mmHg  VENTILATOR SETTINGS: Vent Mode: PRVC FiO2 (%):  [40 %-50 %] 40 % Set Rate:  [20 bmp] 20 bmp Vt Set:  [510 mL] 510 mL PEEP:  [5 cmH20] 5 cmH20 Plateau Pressure:  [16 cmH20-24 cmH20] 24 cmH20  INTAKE / OUTPUT: I/O last 3 completed shifts: In: 2430.8 [I.V.:1890.8; NG/GT:90; IV Piggyback:450] Out: 2114 [Other:2114]  PHYSICAL EXAMINATION: Gen:      No acute distress HEENT:  EOMI, sclera anicteric, ET tube in place Neck:     No masses; no thyromegaly Lungs:    Clear to auscultation bilaterally; normal respiratory effort CV:         Regular rate and rhythm; no murmurs Abd:      + bowel sounds; soft, non-tender; no palpable masses, no  distension Ext:    No edema; adequate peripheral perfusion Skin:      Warm and dry; no rash Neuro: Sedated, unresponsive  LABS:  BMET  Recent Labs Lab 01/22/17 0412 01/22/17 1546 01/23/17 0404  NA 133* 132* 134*  K 4.4 4.3 4.3  CL 97* 97* 99*  CO2 18* 19* 20*  BUN 37* 36* 31*  CREATININE 7.45* 6.77* 5.19*  GLUCOSE 112* 94 103*    Electrolytes  Recent Labs Lab 01/20/17 0358  01/21/17 0445  01/22/17 0412 01/22/17 1546 01/23/17 0404  CALCIUM 10.2  < >  --   < > 9.7 9.2 9.0  MG 2.5*  --  2.4  --  2.3  --   --   PHOS 6.0*  < >  --   < > 5.8* 4.4 3.5  < > = values in this interval not displayed.  CBC  Recent Labs Lab 01/21/17 0445 01/22/17 0412 01/23/17 0404  WBC 7.6 9.9 8.5  HGB 8.9* 9.4* 10.0*  HCT 30.0* 31.2* 31.7*  PLT 153 126* 75*    Coag's  Recent Labs Lab 01/21/17 1339 01/22/17 0412 01/23/17 0404  INR 1.34 1.44 1.38    Sepsis Markers  Recent Labs Lab 01/22/17 0751  LATICACIDVEN 1.4    ABG  Recent Labs Lab 01/21/17 1617 01/22/17 0728 01/23/17 0330  PHART 7.288* 7.295* 7.316*  PCO2ART 43.3 38.3  39.6  PO2ART 120.0* 59.0* 70.3*    Liver Enzymes  Recent Labs Lab 01/20/17 0930  01/22/17 0412 01/22/17 1546 01/23/17 0404  AST 33  --   --   --   --   ALT 18  --   --   --   --   ALKPHOS 74  --   --   --   --   BILITOT 0.8  --   --   --   --   ALBUMIN 2.8*  < > 2.6* 2.4* 2.3*  < > = values in this interval not displayed.  Cardiac Enzymes  Recent Labs Lab 01/22/17 0751  TROPONINI 0.59*    Glucose No results for input(s): GLUCAP in the last 168 hours.  Imaging Dg Chest Port 1 View  Result Date: 01/23/2017 CLINICAL DATA:  Acute respiratory failure. EXAM: PORTABLE CHEST 1 VIEW COMPARISON:  One-view chest x-ray 01/22/2017 FINDINGS: Endotracheal tube is stable, 4.4 cm above the carina. NG tube courses off the inferior border the film. A left IJ line is stable. The right IJ dialysis catheter is stable. The heart is enlarged.  Mild edema has slightly increased. Retrocardiac opacification remains. IMPRESSION: 1. Slight increase and mild edema. 2. Stable retrocardiac opacification likely reflecting a combination of atelectasis and effusion. Infection is not excluded. 3. The support apparatus is stable. Electronically Signed   By: San Morelle M.D.   On: 01/23/2017 07:58   STUDIES: CT Head 4/11 > mixed density mass like area along the surface of the anterior frontal lobe, with cytotoxic type edema as well as a rounded 2 cm area of hemorrhage EEG 4/13 >> CT Head 4/13 > Left frontal mixed attenuation collection, hyperdense acute hemorrhage posteriorly, max thickness between 13-15 mm, acute left parafalcine subdural hemorrhage, 2 mm left to right shift  MRI Brain 4/13 >> left subdural hematoma with slight shift Chest x-ray. 4/14 >> more confluent left lower lobe opacity, ET tube and lines in place. Pulmonary edema persists. I have reviewed her images personally. MRA/MRV 4/14 >>   CULTURES: Blood 4/01 > E faecalis, vancomycin resistant  ANTIBIOTICS: Vancomycin 4/01 > 4/03 Daptomycin 4/03 > 4/05 Ampicillin 4/05 > Rocephin 4/05 >  SIGNIFICANT EVENTS: 3/18 Admit 3/21 start CRRT 3/27 DC cardioversion 3/29 To SDU 3/31 Fever 4/06 Failed repeat DC cardioversion 4/08 Transition off CRRT 4/12 New ICH   LINES/TUBES: Rt IJ HD cath 3/31 >> Lt IJ CVL 4/02 > 4/13 Lt IJ CVL 4/13 >> ETT 4/13 >> Rt fem A line 4/14 >>  DISCUSSION: 61 yo female admitted with CHF exacerbation complicated by renal failure and VRE bacteremia, on 4/12 found to have new ICH after cardioversion  ASSESSMENT / PLAN:  PULMONARY A: Chronic Hypoxic/Hypercarbic resp failure in setting of COPD, OSA/OHS P:   Continue vent support Breathing trials when mental status permits Follow chest x-ray, ABG  CARDIOVASCULAR A:  Acute on Chronic Combined CHF Hypotension  Afib S/P cardioversion 4/12 H/O HTN P:  Telemetry monitoring Wean off  Levophed drip Will wean down pressors as MAPs tolerate, Continue amiodarone Hold anticoagulation in setting of intracranial hemorrhage  RENAL A:   Acute on Chronic renal failure with cardiorenal syndrome  P:   On CVVH. - 50cc/hr  GASTROINTESTINAL A:   Dysphagia  P:   Start every feeds PPI  HEMATOLOGIC A:   ICH Progressive thrombocytopenia P:  Hold anticoagulation Check HIT antibody, DIC panel, haptoglobin SCDs  INFECTIOUS A:   VRE Bacteremia  P:   Continue antibiotics to  4/18 per ID 2 weeks after course completed will need blood cultures  Trend WBC and Fever curve   ENDOCRINE A:   No issues    P:   Follow glucose and BMP  NEUROLOGIC A:   Left Frontal ICH  Uremic Encephalopathy vs Subclinical seizures vs showering emboli  Deconditioning  P:   RASS Goal: 0 Neurology on board.  FAMILY  - Updates: Sister updated 4/13. No family at bedside 4/14 and 4/15 - Inter-disciplinary family meet or Palliative Care meeting due by: ongoing    The patient is critically ill with multiple organ system failure and requires high complexity decision making for assessment and support, frequent evaluation and titration of therapies, advanced monitoring, review of radiographic studies and interpretation of complex data.   Critical Care Time devoted to patient care services, exclusive of separately billable procedures, described in this note is 45 minutes.   Marshell Garfinkel MD [facility] Pager [phone] If no answer or after 3pm call: [phone] 01/23/2017, 8:10 AM

## 2017-01-23 NOTE — Progress Notes (Signed)
[facility] Brief Note  Multiple issues with filter clotting Have increased pre-infusion fluid rate to 500 If further issues, then will proceed with citrate protocol. Orders signed and held, RN to release if another filter clots  Jamal Maes, MD [place] Pager 01/23/2017, 8:29 PM

## 2017-01-24 ENCOUNTER — Inpatient Hospital Stay (HOSPITAL_COMMUNITY): Payer: Medicare Other

## 2017-01-24 DIAGNOSIS — D696 Thrombocytopenia, unspecified: Secondary | ICD-10-CM

## 2017-01-24 DIAGNOSIS — D7582 Heparin induced thrombocytopenia (HIT): Secondary | ICD-10-CM

## 2017-01-24 DIAGNOSIS — D75829 Heparin-induced thrombocytopenia, unspecified: Secondary | ICD-10-CM

## 2017-01-24 DIAGNOSIS — D65 Disseminated intravascular coagulation [defibrination syndrome]: Secondary | ICD-10-CM

## 2017-01-24 DIAGNOSIS — D689 Coagulation defect, unspecified: Secondary | ICD-10-CM

## 2017-01-24 DIAGNOSIS — R791 Abnormal coagulation profile: Secondary | ICD-10-CM

## 2017-01-24 LAB — BLOOD GAS, ARTERIAL
ACID-BASE DEFICIT: 3.1 mmol/L — AB (ref 0.0–2.0)
Bicarbonate: 21.7 mmol/L (ref 20.0–28.0)
FIO2: 40
MECHVT: 510 mL
O2 SAT: 94.6 %
PEEP/CPAP: 5 cmH2O
PH ART: 7.34 — AB (ref 7.350–7.450)
Patient temperature: 98.6
RATE: 20 resp/min
pCO2 arterial: 41.4 mmHg (ref 32.0–48.0)
pO2, Arterial: 75.6 mmHg — ABNORMAL LOW (ref 83.0–108.0)

## 2017-01-24 LAB — POCT I-STAT, CHEM 8
BUN: 21 mg/dL — AB (ref 6–20)
BUN: 24 mg/dL — AB (ref 6–20)
BUN: 21 mg/dL — ABNORMAL HIGH (ref 6–20)
BUN: 25 mg/dL — ABNORMAL HIGH (ref 6–20)
BUN: 21 mg/dL — ABNORMAL HIGH (ref 6–20)
BUN: 23 mg/dL — ABNORMAL HIGH (ref 6–20)
BUN: 20 mg/dL (ref 6–20)
BUN: 23 mg/dL — ABNORMAL HIGH (ref 6–20)
BUN: 22 mg/dL — AB (ref 6–20)
BUN: 24 mg/dL — AB (ref 6–20)
BUN: 22 mg/dL — AB (ref 6–20)
BUN: 30 mg/dL — AB (ref 6–20)
BUN: 21 mg/dL — ABNORMAL HIGH (ref 6–20)
BUN: 25 mg/dL — AB (ref 6–20)
CALCIUM ION: 0.6 mmol/L — AB (ref 1.15–1.40)
CALCIUM ION: 0.53 mmol/L — AB (ref 1.15–1.40)
CALCIUM ION: 1.09 mmol/L — AB (ref 1.15–1.40)
CALCIUM ION: 0.54 mmol/L — AB (ref 1.15–1.40)
CALCIUM ION: 1.03 mmol/L — AB (ref 1.15–1.40)
CALCIUM ION: 0.95 mmol/L — AB (ref 1.15–1.40)
CHLORIDE: 97 mmol/L — AB (ref 101–111)
CHLORIDE: 93 mmol/L — AB (ref 101–111)
CHLORIDE: 98 mmol/L — AB (ref 101–111)
CHLORIDE: 93 mmol/L — AB (ref 101–111)
CHLORIDE: 97 mmol/L — AB (ref 101–111)
CHLORIDE: 94 mmol/L — AB (ref 101–111)
CHLORIDE: 96 mmol/L — AB (ref 101–111)
CHLORIDE: 91 mmol/L — AB (ref 101–111)
CHLORIDE: 94 mmol/L — AB (ref 101–111)
CREATININE: 3.8 mg/dL — AB (ref 0.44–1.00)
CREATININE: 3.4 mg/dL — AB (ref 0.44–1.00)
CREATININE: 2.6 mg/dL — AB (ref 0.44–1.00)
CREATININE: 3.1 mg/dL — AB (ref 0.44–1.00)
CREATININE: 2.9 mg/dL — AB (ref 0.44–1.00)
Calcium, Ion: 1.08 mmol/L — ABNORMAL LOW (ref 1.15–1.40)
Calcium, Ion: 0.58 mmol/L — CL (ref 1.15–1.40)
Calcium, Ion: 1.1 mmol/L — ABNORMAL LOW (ref 1.15–1.40)
Calcium, Ion: 0.46 mmol/L — CL (ref 1.15–1.40)
Calcium, Ion: 1.03 mmol/L — ABNORMAL LOW (ref 1.15–1.40)
Calcium, Ion: 0.4 mmol/L — CL (ref 1.15–1.40)
Calcium, Ion: 0.4 mmol/L — CL (ref 1.15–1.40)
Calcium, Ion: 0.97 mmol/L — ABNORMAL LOW (ref 1.15–1.40)
Chloride: 100 mmol/L — ABNORMAL LOW (ref 101–111)
Chloride: 94 mmol/L — ABNORMAL LOW (ref 101–111)
Chloride: 99 mmol/L — ABNORMAL LOW (ref 101–111)
Chloride: 89 mmol/L — ABNORMAL LOW (ref 101–111)
Chloride: 92 mmol/L — ABNORMAL LOW (ref 101–111)
Creatinine, Ser: 2.9 mg/dL — ABNORMAL HIGH (ref 0.44–1.00)
Creatinine, Ser: 2.7 mg/dL — ABNORMAL HIGH (ref 0.44–1.00)
Creatinine, Ser: 3.6 mg/dL — ABNORMAL HIGH (ref 0.44–1.00)
Creatinine, Ser: 2.9 mg/dL — ABNORMAL HIGH (ref 0.44–1.00)
Creatinine, Ser: 3.5 mg/dL — ABNORMAL HIGH (ref 0.44–1.00)
Creatinine, Ser: 2.7 mg/dL — ABNORMAL HIGH (ref 0.44–1.00)
Creatinine, Ser: 2.6 mg/dL — ABNORMAL HIGH (ref 0.44–1.00)
Creatinine, Ser: 3.2 mg/dL — ABNORMAL HIGH (ref 0.44–1.00)
Creatinine, Ser: 2.3 mg/dL — ABNORMAL HIGH (ref 0.44–1.00)
GLUCOSE: 139 mg/dL — AB (ref 65–99)
GLUCOSE: 148 mg/dL — AB (ref 65–99)
GLUCOSE: 187 mg/dL — AB (ref 65–99)
GLUCOSE: 149 mg/dL — AB (ref 65–99)
Glucose, Bld: 132 mg/dL — ABNORMAL HIGH (ref 65–99)
Glucose, Bld: 169 mg/dL — ABNORMAL HIGH (ref 65–99)
Glucose, Bld: 140 mg/dL — ABNORMAL HIGH (ref 65–99)
Glucose, Bld: 179 mg/dL — ABNORMAL HIGH (ref 65–99)
Glucose, Bld: 183 mg/dL — ABNORMAL HIGH (ref 65–99)
Glucose, Bld: 141 mg/dL — ABNORMAL HIGH (ref 65–99)
Glucose, Bld: 171 mg/dL — ABNORMAL HIGH (ref 65–99)
Glucose, Bld: 141 mg/dL — ABNORMAL HIGH (ref 65–99)
Glucose, Bld: 190 mg/dL — ABNORMAL HIGH (ref 65–99)
Glucose, Bld: 195 mg/dL — ABNORMAL HIGH (ref 65–99)
HCT: 30 % — ABNORMAL LOW (ref 36.0–46.0)
HCT: 31 % — ABNORMAL LOW (ref 36.0–46.0)
HCT: 34 % — ABNORMAL LOW (ref 36.0–46.0)
HCT: 33 % — ABNORMAL LOW (ref 36.0–46.0)
HCT: 30 % — ABNORMAL LOW (ref 36.0–46.0)
HEMATOCRIT: 31 % — AB (ref 36.0–46.0)
HEMATOCRIT: 33 % — AB (ref 36.0–46.0)
HEMATOCRIT: 30 % — AB (ref 36.0–46.0)
HEMATOCRIT: 32 % — AB (ref 36.0–46.0)
HEMATOCRIT: 33 % — AB (ref 36.0–46.0)
HEMATOCRIT: 35 % — AB (ref 36.0–46.0)
HEMATOCRIT: 31 % — AB (ref 36.0–46.0)
HEMATOCRIT: 33 % — AB (ref 36.0–46.0)
HEMATOCRIT: 32 % — AB (ref 36.0–46.0)
HEMOGLOBIN: 10.2 g/dL — AB (ref 12.0–15.0)
HEMOGLOBIN: 10.9 g/dL — AB (ref 12.0–15.0)
HEMOGLOBIN: 10.2 g/dL — AB (ref 12.0–15.0)
Hemoglobin: 10.5 g/dL — ABNORMAL LOW (ref 12.0–15.0)
Hemoglobin: 11.2 g/dL — ABNORMAL LOW (ref 12.0–15.0)
Hemoglobin: 10.2 g/dL — ABNORMAL LOW (ref 12.0–15.0)
Hemoglobin: 11.2 g/dL — ABNORMAL LOW (ref 12.0–15.0)
Hemoglobin: 10.5 g/dL — ABNORMAL LOW (ref 12.0–15.0)
Hemoglobin: 11.6 g/dL — ABNORMAL LOW (ref 12.0–15.0)
Hemoglobin: 11.2 g/dL — ABNORMAL LOW (ref 12.0–15.0)
Hemoglobin: 11.9 g/dL — ABNORMAL LOW (ref 12.0–15.0)
Hemoglobin: 10.5 g/dL — ABNORMAL LOW (ref 12.0–15.0)
Hemoglobin: 11.2 g/dL — ABNORMAL LOW (ref 12.0–15.0)
Hemoglobin: 10.9 g/dL — ABNORMAL LOW (ref 12.0–15.0)
POTASSIUM: 4 mmol/L (ref 3.5–5.1)
POTASSIUM: 3.9 mmol/L (ref 3.5–5.1)
POTASSIUM: 3.9 mmol/L (ref 3.5–5.1)
POTASSIUM: 4 mmol/L (ref 3.5–5.1)
POTASSIUM: 4 mmol/L (ref 3.5–5.1)
POTASSIUM: 4.1 mmol/L (ref 3.5–5.1)
POTASSIUM: 4.2 mmol/L (ref 3.5–5.1)
POTASSIUM: 3.6 mmol/L (ref 3.5–5.1)
Potassium: 3.9 mmol/L (ref 3.5–5.1)
Potassium: 3.8 mmol/L (ref 3.5–5.1)
Potassium: 4 mmol/L (ref 3.5–5.1)
Potassium: 3.8 mmol/L (ref 3.5–5.1)
Potassium: 4.1 mmol/L (ref 3.5–5.1)
Potassium: 3.5 mmol/L (ref 3.5–5.1)
SODIUM: 134 mmol/L — AB (ref 135–145)
SODIUM: 137 mmol/L (ref 135–145)
SODIUM: 135 mmol/L (ref 135–145)
SODIUM: 136 mmol/L (ref 135–145)
SODIUM: 137 mmol/L (ref 135–145)
SODIUM: 134 mmol/L — AB (ref 135–145)
SODIUM: 136 mmol/L (ref 135–145)
SODIUM: 137 mmol/L (ref 135–145)
Sodium: 135 mmol/L (ref 135–145)
Sodium: 136 mmol/L (ref 135–145)
Sodium: 134 mmol/L — ABNORMAL LOW (ref 135–145)
Sodium: 136 mmol/L (ref 135–145)
Sodium: 137 mmol/L (ref 135–145)
Sodium: 136 mmol/L (ref 135–145)
TCO2: 24 mmol/L (ref 0–100)
TCO2: 26 mmol/L (ref 0–100)
TCO2: 26 mmol/L (ref 0–100)
TCO2: 27 mmol/L (ref 0–100)
TCO2: 25 mmol/L (ref 0–100)
TCO2: 26 mmol/L (ref 0–100)
TCO2: 26 mmol/L (ref 0–100)
TCO2: 26 mmol/L (ref 0–100)
TCO2: 25 mmol/L (ref 0–100)
TCO2: 27 mmol/L (ref 0–100)
TCO2: 27 mmol/L (ref 0–100)
TCO2: 29 mmol/L (ref 0–100)
TCO2: 28 mmol/L (ref 0–100)
TCO2: 30 mmol/L (ref 0–100)

## 2017-01-24 LAB — SAVE SMEAR

## 2017-01-24 LAB — ALBUMIN: ALBUMIN: 2 g/dL — AB (ref 3.5–5.0)

## 2017-01-24 LAB — GLUCOSE, CAPILLARY
Glucose-Capillary: 133 mg/dL — ABNORMAL HIGH (ref 65–99)
Glucose-Capillary: 106 mg/dL — ABNORMAL HIGH (ref 65–99)
Glucose-Capillary: 135 mg/dL — ABNORMAL HIGH (ref 65–99)

## 2017-01-24 LAB — RENAL FUNCTION PANEL
Albumin: 2.1 g/dL — ABNORMAL LOW (ref 3.5–5.0)
Anion gap: 16 — ABNORMAL HIGH (ref 5–15)
BUN: 24 mg/dL — AB (ref 6–20)
CHLORIDE: 97 mmol/L — AB (ref 101–111)
CO2: 25 mmol/L (ref 22–32)
CREATININE: 3.17 mg/dL — AB (ref 0.44–1.00)
Calcium: 8.9 mg/dL (ref 8.9–10.3)
GFR calc Af Amer: 17 mL/min — ABNORMAL LOW (ref >60)
GFR, EST NON AFRICAN AMERICAN: 15 mL/min — AB (ref >60)
GLUCOSE: 131 mg/dL — AB (ref 65–99)
Phosphorus: 2.3 mg/dL — ABNORMAL LOW (ref 2.5–4.6)
Potassium: 4.2 mmol/L (ref 3.5–5.1)
SODIUM: 138 mmol/L (ref 135–145)

## 2017-01-24 LAB — BASIC METABOLIC PANEL
ANION GAP: 14 (ref 5–15)
BUN: 24 mg/dL — AB (ref 6–20)
CHLORIDE: 101 mmol/L (ref 101–111)
CO2: 22 mmol/L (ref 22–32)
Calcium: 8.3 mg/dL — ABNORMAL LOW (ref 8.9–10.3)
Creatinine, Ser: 3.63 mg/dL — ABNORMAL HIGH (ref 0.44–1.00)
GFR, EST AFRICAN AMERICAN: 15 mL/min — AB (ref >60)
GFR, EST NON AFRICAN AMERICAN: 13 mL/min — AB (ref >60)
Glucose, Bld: 108 mg/dL — ABNORMAL HIGH (ref 65–99)
POTASSIUM: 4.2 mmol/L (ref 3.5–5.1)
SODIUM: 137 mmol/L (ref 135–145)

## 2017-01-24 LAB — PROTIME-INR
INR: 1.26
PROTHROMBIN TIME: 15.9 s — AB (ref 11.4–15.2)

## 2017-01-24 LAB — CBC
HEMATOCRIT: 29.8 % — AB (ref 36.0–46.0)
HEMOGLOBIN: 9.3 g/dL — AB (ref 12.0–15.0)
MCH: 27.7 pg (ref 26.0–34.0)
MCHC: 31.2 g/dL (ref 30.0–36.0)
MCV: 88.7 fL (ref 78.0–100.0)
Platelets: 39 10*3/uL — ABNORMAL LOW (ref 150–400)
RBC: 3.36 MIL/uL — AB (ref 3.87–5.11)
RDW: 26.9 % — ABNORMAL HIGH (ref 11.5–15.5)
WBC: 6.7 10*3/uL (ref 4.0–10.5)

## 2017-01-24 LAB — MAGNESIUM
MAGNESIUM: 2.3 mg/dL (ref 1.7–2.4)
MAGNESIUM: 2.1 mg/dL (ref 1.7–2.4)
MAGNESIUM: 2.1 mg/dL (ref 1.7–2.4)

## 2017-01-24 LAB — FIBRINOGEN: FIBRINOGEN: 231 mg/dL (ref 210–475)

## 2017-01-24 LAB — HEPARIN INDUCED PLATELET AB (HIT ANTIBODY): HEPARIN INDUCED PLT AB: 1.12 {OD_unit} — AB (ref 0.000–0.400)

## 2017-01-24 LAB — LACTATE DEHYDROGENASE: LDH: 341 U/L — ABNORMAL HIGH (ref 98–192)

## 2017-01-24 LAB — PHOSPHORUS
PHOSPHORUS: 2.9 mg/dL (ref 2.5–4.6)
PHOSPHORUS: 2.5 mg/dL (ref 2.5–4.6)
PHOSPHORUS: 2.3 mg/dL — AB (ref 2.5–4.6)

## 2017-01-24 LAB — HAPTOGLOBIN: HAPTOGLOBIN: 54 mg/dL (ref 34–200)

## 2017-01-24 LAB — APTT: APTT: 66 s — AB (ref 24–36)

## 2017-01-24 MED ORDER — VITAL HIGH PROTEIN PO LIQD
1000.0000 mL | ORAL | Status: DC
  Administered 2017-01-24 – 2017-01-26 (×2): 1000 mL

## 2017-01-24 MED ORDER — PRISMASOL B22GK 4/0 22-4 MEQ/L IV SOLN
INTRAVENOUS | Status: DC
  Administered 2017-01-24 – 2017-01-26 (×10): via INTRAVENOUS_CENTRAL
  Filled 2017-01-24 (×29): qty 5000

## 2017-01-24 MED ORDER — ACETAMINOPHEN 160 MG/5ML PO SOLN: 650.0000 mg | Freq: Four times a day (QID) | ORAL | Status: DC | PRN

## 2017-01-24 MED ORDER — PRO-STAT SUGAR FREE PO LIQD
30.0000 mL | Freq: Two times a day (BID) | ORAL | Status: DC
  Administered 2017-01-24: 30 mL
  Filled 2017-01-24: qty 30

## 2017-01-24 MED ORDER — PANTOPRAZOLE SODIUM 40 MG PO PACK
40.0000 mg | PACK | ORAL | Status: DC
  Administered 2017-01-24 – 2017-01-31 (×8): 40 mg
  Filled 2017-01-24 (×8): qty 20

## 2017-01-24 MED ORDER — DEXTROSE 5 % IV SOLN
Status: DC
  Administered 2017-01-24 – 2017-01-26 (×8): via INTRAVENOUS_CENTRAL
  Filled 2017-01-24 (×12): qty 1500

## 2017-01-24 MED ORDER — PRO-STAT SUGAR FREE PO LIQD
60.0000 mL | Freq: Two times a day (BID) | ORAL | Status: DC
  Administered 2017-01-24 – 2017-01-26 (×4): 60 mL
  Filled 2017-01-24 (×4): qty 60

## 2017-01-24 MED ORDER — VITAL HIGH PROTEIN PO LIQD
1000.0000 mL | ORAL | Status: DC
  Administered 2017-01-24 (×3)
  Administered 2017-01-24: 1000 mL

## 2017-01-24 MED ORDER — DEXTROSE 5 % IV SOLN
20.0000 g | INTRAVENOUS | Status: DC
  Administered 2017-01-24 – 2017-01-26 (×5): 20 g via INTRAVENOUS_CENTRAL
  Filled 2017-01-24 (×8): qty 200

## 2017-01-24 NOTE — Progress Notes (Signed)
PULMONARY / CRITICAL CARE MEDICINE   Name: Paige Marshall MRN: [id number] DOB: [birth year]    ADMISSION DATE:  12/26/2016 CONSULTATION DATE:  01/20/2017  REFERRING MD:  Dr. Evette Doffing   CHIEF COMPLAINT:  AMS  HPI: 61 yo female from rehab with dyspnea 2nd to CHF exacerbation.  She required CRRT in setting of acute on chronic kidney disease.  Found to have VRE bacteremia.  Developed altered mental status 4/12 and aphasia.  CT head showed mixed density mass like area along the surface of the anterior frontal lobe with cytotoxic edema as well as a rounded 2 cm hemorrhage.  She was transferred to back to ICU.  SUBJECTIVE:  Remains on CRRT, pressors, vent support.  VITAL SIGNS: BP 96/75   Pulse (!) 56   Temp 99.1 F (37.3 C) (Oral)   Resp 20   Ht 5\' 8"  (1.727 m)   Wt 288 lb 12.8 oz (131 kg)   LMP 09/07/2011   SpO2 99%   BMI 43.91 kg/m   HEMODYNAMICS: CVP:  [8 mmHg-15 mmHg] 15 mmHg  VENTILATOR SETTINGS: Vent Mode: CPAP;PSV FiO2 (%):  [40 %] 40 % Set Rate:  [20 bmp] 20 bmp Vt Set:  [510 mL] 510 mL PEEP:  [5 cmH20] 5 cmH20 Pressure Support:  [5 cmH20-8 cmH20] 5 cmH20 Plateau Pressure:  [20 JOA41-66 cmH20] 20 cmH20  INTAKE / OUTPUT: I/O last 3 completed shifts: In: 2758.4 [I.V.:2148.4; NG/GT:60; IV Piggyback:550] Out: 4209 [Other:4209]  PHYSICAL EXAMINATION: General: ill appearing Neuro: RASS -1 Eyes: pupils reactive ENT: ETT in place Cardiac: regular Chest: b/l rhonchi Abd: soft, non tender Ext: Rt foot drop Skin: no rashes  LABS: CMP Latest Ref Rng & Units 01/24/2017 01/24/2017 01/24/2017  Glucose 65 - 99 mg/dL 179(H) 140(H) 132(H)  BUN 6 - 20 mg/dL 21(H) 25(H) 24(H)  Creatinine 0.44 - 1.00 mg/dL 2.70(H) 3.60(H) 3.80(H)  Sodium 135 - 145 mmol/L 137 134(L) 135  Potassium 3.5 - 5.1 mmol/L 3.9 3.9 4.0  Chloride 101 - 111 mmol/L 94(L) 99(L) 100(L)  CO2 22 - 32 mmol/L - - -  Calcium 8.9 - 10.3 mg/dL - - -  Total Protein 6.5 - 8.1 g/dL - - -  Total Bilirubin 0.3 -  1.2 mg/dL - - -  Alkaline Phos 38 - 126 U/L - - -  AST 15 - 41 U/L - - -  ALT 14 - 54 U/L - - -    CBC Latest Ref Rng & Units 01/24/2017 01/24/2017 01/24/2017  WBC 4.0 - 10.5 K/uL - - -  Hemoglobin 12.0 - 15.0 g/dL 10.9(L) 10.2(L) 10.5(L)  Hematocrit 36.0 - 46.0 % 32.0(L) 30.0(L) 31.0(L)  Platelets 150 - 400 K/uL - - -    ABG    Component Value Date/Time   PHART 7.340 (L) 01/24/2017 0440   PCO2ART 41.4 01/24/2017 0440   PO2ART 75.6 (L) 01/24/2017 0440   HCO3 21.7 01/24/2017 0440   TCO2 27 01/24/2017 0815   ACIDBASEDEF 3.1 (H) 01/24/2017 0440   O2SAT 94.6 01/24/2017 0440    Dg Chest Port 1 View  Result Date: 01/24/2017 CLINICAL DATA:  Intubated patient, acute respiratory failure. EXAM: PORTABLE CHEST 1 VIEW COMPARISON:  Portable chest x-ray of January 23, 2017 FINDINGS: The cardiac silhouette remains enlarged. The retrocardiac region remains dense with obscuration of the left hemidiaphragm. The interstitial markings remain increased in the pulmonary vascularity engorged. There is calcification in the wall of the aortic arch. The endotracheal tube tip lies 2.7 cm above the carina. The esophagogastric  tube tip projects below the inferior margin of the image. The dual-lumen dialysis type catheter tip projects over the midportion of the SVC. The left internal jugular venous catheter tip projects over the proximal SVC. IMPRESSION: CHF with pulmonary interstitial edema. Left lower lobe atelectasis or pneumonia. The support tubes are in reasonable position. There has not been significant interval change since yesterday's study. The support tubes are in reasonable position. Thoracic aortic atherosclerosis. Electronically Signed   By: David  Martinique M.D.   On: 01/24/2017 07:22   Dg Chest Port 1 View  Result Date: 01/23/2017 CLINICAL DATA:  Acute respiratory failure. EXAM: PORTABLE CHEST 1 VIEW COMPARISON:  One-view chest x-ray 01/22/2017 FINDINGS: Endotracheal tube is stable, 4.4 cm above the carina.  NG tube courses off the inferior border the film. A left IJ line is stable. The right IJ dialysis catheter is stable. The heart is enlarged. Mild edema has slightly increased. Retrocardiac opacification remains. IMPRESSION: 1. Slight increase and mild edema. 2. Stable retrocardiac opacification likely reflecting a combination of atelectasis and effusion. Infection is not excluded. 3. The support apparatus is stable. Electronically Signed   By: San Morelle M.D.   On: 01/23/2017 07:58     STUDIES: CT Head 4/11 > mixed density mass like area along the surface of the anterior frontal lobe, with cytotoxic type edema as well as a rounded 2 cm area of hemorrhage CT Head 4/13 > Left frontal mixed attenuation collection, hyperdense acute hemorrhage posteriorly, max thickness between 13-15 mm, acute left parafalcine subdural hemorrhage, 2 mm left to right shift  MRI Brain 4/13 >> left subdural hematoma with slight shift EEG 4/14 >> generalized slowing  CULTURES: Blood 4/01 > E faecalis, vancomycin resistant  ANTIBIOTICS: Vancomycin 4/01 > 4/03 Daptomycin 4/03 > 4/05 Ampicillin 4/05 > Rocephin 4/05 >  SIGNIFICANT EVENTS: 3/18 Admit 3/21 start CRRT 3/27 DC cardioversion 3/29 To SDU 3/31 Fever 4/06 Failed repeat DC cardioversion 4/08 Transition off CRRT 4/12 New ICH   LINES/TUBES: Rt IJ HD cath 3/31 >> Lt IJ CVL 4/02 > 4/13 Lt IJ CVL 4/13 >> ETT 4/13 >> Rt fem A line 4/14 >>  DISCUSSION: 61 yo female admitted with CHF exacerbation complicated by renal failure and VRE bacteremia, on 4/12 found to have new ICH after cardioversion.  Has persistent mental status change, but seems unlikely that brain imaging findings significant enough to explain changes.  Has progressive thrombocytopenia with schistocytes.  ASSESSMENT / PLAN:  Acute encephalopathy. Lt frontal ICH with SDH. Rt foot drop. - RASS goal 0 to -1 - monitor mental status - f/u with neurology - foot drop  boot  Acute on chronic hypoxic/hypercapnic respiratory failure. Hx of COPD, OSA, OHS. - pressure support wean - mental status precludes extubation trial - f/u CXR  Acute on chronic combined CHF. A fib s/p cardioversion 4/12. Cardiogenic shock. - pressors to keep MAP > 65 - amiodarone per cardiology  Acute renal failure. - CRRT per nephrology  Thrombocytopenia. 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 - Continue antibiotics to 4/18 per ID  DVT prophylaxis - SCDs SUP - Protonix Nutrition - tube feeds Goals of care - full code  CC time 36 minutes  Chesley Mires, MD [place] 01/24/2017, 10:13 AM Pager:  [phone] After 3pm call: [phone]

## 2017-01-24 NOTE — Progress Notes (Signed)
Orthopedic Tech Progress Note Patient Details:  Paige Marshall 1956-03-10 [id number]  [name], female   DOB: 09-03-1956, 61 y.o.   MRN: [id number]   Paige Marshall 01/24/2017, 11:26 AMCalled [facility] for foot and ankle orthosis.

## 2017-01-24 NOTE — Progress Notes (Signed)
[facility] Progress Note Patient Name: Paige Marshall DOB: 02/22/1956 MRN: [id number]   Date of Service  01/24/2017  HPI/Events of Note  Patient noted to have intracerebral hemorrhage while on heparin drip. Heparin drip has been discontinued. Patient has atrial fibrillation for which she was cardioverted today. Nurse reports patient back in atrial fibrillation.   eICU Interventions  Blood pressure 110/50, pulse 120, 99% O2 saturation. Proximal atrial fibrillation on monitor. Continue to observe for now. Hold off on any treatment.         [place] 01/24/2017, 2:40 AM

## 2017-01-24 NOTE — Care Management (Signed)
CM Requested Palliative Consult

## 2017-01-24 NOTE — Progress Notes (Signed)
STROKE TEAM PROGRESS NOTE   SUBJECTIVE (INTERVAL HISTORY) Boy friend and ? sister  at the bedside. Updated them regarding pt condition and answered all their questions. Pt still intubated, on fentanyl, poorly responsive.     OBJECTIVE Temp:  [97 F (36.1 C)-99.4 F (37.4 C)] 98 F (36.7 C) (04/16 1147) Pulse Rate:  [31-124] 93 (04/16 1300) Cardiac Rhythm: Atrial fibrillation (04/16 1200) Resp:  [12-26] 26 (04/16 1300) BP: (83-110)/(47-86) 106/83 (04/16 1300) SpO2:  [94 %-100 %] 98 % (04/16 1300) Arterial Line BP: (99-144)/(42-64) 125/56 (04/16 1200) FiO2 (%):  [40 %] 40 % (04/16 1200) Weight:  [288 lb 12.8 oz (131 kg)] 288 lb 12.8 oz (131 kg) (04/16 0200)  CBC:   Recent Labs Lab 01/23/17 0404 01/23/17 0821 01/24/17 0347  01/24/17 1148 01/24/17 1152  WBC 8.5  --  6.7  --   --   --   HGB 10.0*  --  9.3*  < > 10.5* 11.6*  HCT 31.7*  --  29.8*  < > 31.0* 34.0*  MCV 86.8  --  88.7  --   --   --   PLT 75* 73* 39*  --   --   --   < > = values in this interval not displayed.  Basic Metabolic Panel:   Recent Labs Lab 01/23/17 1700 01/24/17 0347  01/24/17 1016 01/24/17 1148 01/24/17 1152  NA 136 137  < >  --  134* 137  K 4.3 4.2  < >  --  4.0 4.0  CL 98* 101  < >  --  97* 93*  CO2 21* 22  --   --   --   --   GLUCOSE 94 108*  < >  --  148* 187*  BUN 25* 24*  < >  --  23* 20  CREATININE 4.26* 3.63*  < >  --  3.40* 2.70*  CALCIUM 8.8* 8.3*  --   --   --   --   MG  --  2.3  --  2.1  --   --   PHOS 3.0 2.9  --  2.5  --   --   < > = values in this interval not displayed.  Lipid Panel:     Component Value Date/Time   CHOL 167 01/15/2015 0346   TRIG 194 (H) 01/15/2015 0346   HDL 35 (L) 01/15/2015 0346   CHOLHDL 4.8 01/15/2015 0346   VLDL 39 01/15/2015 0346   LDLCALC 93 01/15/2015 0346   HgbA1c:  Lab Results  Component Value Date   HGBA1C  10/02/2009    5.6 (NOTE) The ADA recommends the following therapeutic goal for glycemic control related to Hgb A1c measurement:  Goal of therapy: <6.5 Hgb A1c  Reference: American Diabetes Association: Clinical Practice Recommendations 2010, Diabetes Care, 2010, 33: (Suppl  1).   Urine Drug Screen:     Component Value Date/Time   LABOPIA NONE DETECTED 11/15/2009 2107   COCAINSCRNUR NONE DETECTED 11/15/2009 2107   LABBENZ NONE DETECTED 11/15/2009 2107   AMPHETMU NONE DETECTED 11/15/2009 2107   THCU NONE DETECTED 11/15/2009 2107   LABBARB  11/15/2009 2107    NONE DETECTED        DRUG SCREEN FOR MEDICAL PURPOSES ONLY.  IF CONFIRMATION IS NEEDED FOR ANY PURPOSE, NOTIFY LAB WITHIN 5 DAYS.        LOWEST DETECTABLE LIMITS FOR URINE DRUG SCREEN Drug Class       Cutoff (ng/mL) Amphetamine  1000 Barbiturate      200 Benzodiazepine   517 Tricyclics       616 Opiates          300 Cocaine          300 THC              50    Alcohol Level No results found for: [place] I have personally reviewed the radiological images below and agree with the radiology interpretations.  Ct Head Wo Contrast 01/21/2017 Stable LEFT frontal mixed attenuation collections, as described above. 2 mm LEFT-to-RIGHT shift. I believe the collections could both be extra-axial, representing subdural/epidural hematoma/hygroma. Continued surveillance warranted. Patient could not cooperate for MRI. If clinically indicated, repeat could be performed with sedation.   Mr Jodene Nam Head Wo Contrast 01/22/2017 1. 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  2. Otherwise normal MRA circle of Willis without significant proximal stenosis or occlusion.  No other aneurysms are present.   Mr Brain Wo Contrast 01/21/2017 1. Slightly incomplete noncontrast brain MRI.  2. Left-sided subdural hematoma, unchanged from earlier CT. Trace rightward midline shift.  3. Trace right frontal subdural hematoma.  4. No acute infarct.   Mr Mrv Head Wo Cm 01/22/2017 Negative MR venogram.   Dg Chest Port 1 View 01/22/2017 1. Increase in LEFT basilar  atelectasis.  2. Central venous pulmonary congestion similar prior.  3. Stable support apparatus.   Dg Chest Port 1 View 01/23/2017 1. Slight increase and mild edema. 2. Stable retrocardiac opacification likely reflecting a combination of atelectasis and effusion. Infection is not excluded. 3. The support apparatus is stable.   TTE 01/11/2017 - Left ventricle: The cavity size was mildly dilated. Wall   thickness was increased in a pattern of mild LVH. Left   ventricular geometry showed evidence of eccentric hypertrophy.   Systolic function was moderately reduced. The estimated ejection   fraction was in the range of 35% to 40%. Moderate diffuse   hypokinesis with no identifiable regional variations. Acoustic   contrast opacification revealed no evidence ofthrombus. - Ventricular septum: The contour showed diastolic flattening.   These changes are consistent with RV volume overload. - Aortic valve: There was mild to moderate regurgitation directed   centrally in the LVOT. - Mitral valve: There was mild to moderate regurgitation directed   eccentrically and posteriorly. - Left atrium: The atrium was moderately dilated. - Right ventricle: The cavity size was severely dilated. Wall   thickness was normal. Systolic function was mildly reduced. - Right atrium: The atrium was severely dilated. - Tricuspid valve: There was malcoaptation of the valve leaflets.   There was moderate-severe regurgitation. - Pulmonary arteries: Systolic pressure was severely increased. PA   peak pressure: 71 mm Hg (S). - Pericardium, extracardiac: A trivial pericardial effusion was   identified posterior to the heart.  EEG  01/22/2017 This is an abnormal EEG due to severe generalized suppressed and slowing of brain activity and characteristic waveforms consistent with a metabolic encephalopathy.  The patient is not in non-convulsive status epilepticus.     PHYSICAL EXAM  Temp:  [97 F (36.1 C)-99.4 F  (37.4 C)] 98 F (36.7 C) (04/16 1147) Pulse Rate:  [31-124] 93 (04/16 1300) Resp:  [12-26] 26 (04/16 1300) BP: (83-110)/(47-86) 106/83 (04/16 1300) SpO2:  [94 %-100 %] 98 % (04/16 1300) Arterial Line BP: (99-144)/(42-64) 125/56 (04/16 1200) FiO2 (%):  [40 %] 40 % (04/16 1200) Weight:  [288 lb 12.8 oz (131 kg)] 288 lb  12.8 oz (131 kg) (04/16 0200)  General - obese, well developed, intubated on sedation.  Ophthalmologic - Fundi not visualized  Cardiovascular - Regular rate and rhythm.  Neuro - intubated on sedation,   eyes are open but aphasic  , not following commands. Does respond to stimulation by opening eyes but not blinking to visual threat, not tracking. PERRL, doll's eye present, positive corneal and gag. Resume over the vent. On pain stimulation no movement on both are per extremities, however mild withdrawal on bilateral lower extremities. DTR diminished and no Babinski. Sensation, coordination, and gait not tested.    ASSESSMENT/PLAN Paige Marshall is a 61 y.o. female with history ofA. fib on Coumadin, stroke, CHF, CK D, HTN, HLD, obesity, cardiomyopathy presenting with Pulmonary edema, respiratory distress and fluid overload. Received cardioversion, however postprocedure found to have AMS and aphasia. Was on Coumadin with heparin IV bridge, INR 1.74. CT found to have left frontal ICH and parafalcine SDH.   ICH and SDH:  Left frontal small ICH with parafalcine small SDH, etiology likely from trauma and warfarin related subdural hematoma acute and chronic components.. . Pt had fall in 09/2016.  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   MRV - negative  2D Echo - EF 35-40% - no cardiac source of emboli identified.  EEG - severe generalized suppressed and slowing of brain activity and characteristic waveforms consistent with a metabolic  encephalopathy.    SCDs for VTE prophylaxis Diet NPO time specified  heparin IV and warfarin daily discontinued and INR reversed, now on No antithrombotic  Ongoing aggressive stroke risk factor management  Therapy recommendations:  Pending   Disposition:  Pending  Atrial fibrillation   Was on coumadin with heparin bridge  INR 1.74, reversed with VitK and kcentra  Hold off anticoagulation for now  May consider resume anticoagulation once blood absorbed and pt stabilized  On amiodarone IV  AMS  The degree of AMS cannot be explained by CT/MRI findings  Multifactorial, likely due to worsening renal function, bacteremia, sepsis and CHF  Nephrology and ID on board  On ampicillin, Rocephin  EEG - abnormal EEG due to severe generalized suppressed and slowing of brain activity and characteristic waveforms consistent with a metabolic encephalopathy.   Ammonia level slightly elevated at 40  Hypotension on Midrin, Levophed, and Neo BP goal < 140 due to SDH  AKI on CKD  Cre worsening - 7.45 -> 6.77 -> 5.19  Nephrology on board  On CRRT today  VRE bacteremia / sepsis  Blood Cx showed VRE enterococcus  ID on board  On ampicillin and rocephine  BP low on pressors  Other Active Problems  CHF  Cardiomyopathy EF 35-40%  Obesity  OSA  Anemia - stable - 10 / 31.7  Thrombocytopenia - 75 K  Na - 133 -> 132 -> 134  Hospital day # 29 I had a long d/w family at beside and answered questions about prognosis. Will likely need prolonged ventilatory support, tracheostomy and PEG tube and nursing home placement. Await arrival of patient's children to make final decision on level of care This patient is critically ill due to left frontal and parafalcine SDH/hygroma, AMS, afib not on AC and at significant risk of neurological worsening, death form hematoma expansion, brain herniation, stroke, heart failure, sepsis, hypotension. This patient's care requires constant  monitoring of vital signs, hemodynamics,  respiratory and cardiac monitoring, review of multiple databases, neurological assessment, discussion with family, other specialists and medical decision making of high complexity. I spent 30 minutes of neurocritical care time in the care of this patient.   Antony Contras, MD  [facility] [address] [place], [place] [address]  Phone [phone] Fax [phone]  To contact Stroke Continuity provider, please refer to [email or web address] After hours, contact General Neurology

## 2017-01-24 NOTE — Progress Notes (Signed)
Changes made by MD.

## 2017-01-24 NOTE — Progress Notes (Signed)
[facility] Progress Note    Assessment/ Plan:   1. Acute oliguric kidney injury: remains anuric.   We will continue CRRT with citrate protocol.  Continue net neg 50 mL/ hr.   2. Afib with RVR: on amio gtt at present 3.  Shock 2/2 VRE bacteremia: remains on pressor, on antibiotics (end date 4/18) 4.  Acute on chronic hypoxic respiratory failure: remains intubated 5.  SDH: per nursing staff, will open eyes to noxious stimuli, still encephalopathic.   Subjective:    Went into Afib with RVR overnight.  On amio gtt.  Filter keeps clotting--> citrate protocol initiated overnight.   Objective:   BP 104/86   Pulse (!) 31   Temp 98.9 F (37.2 C) (Oral)   Resp 20   Ht 5' 8"  (1.727 m)   Wt 131 kg (288 lb 12.8 oz)   LMP 09/07/2011   SpO2 100%   BMI 43.91 kg/m   Intake/Output Summary (Last 24 hours) at 01/24/17 0733 Last data filed at 01/24/17 0700  Gross per 24 hour  Intake          1892.54 ml  Output             2825 ml  Net          -932.46 ml   Weight change: -2 kg (-4 lb 6.6 oz)  Physical Exam: SPQ:ZRAQT eyes but not to command CVS: tachy, irregular Resp:coarse bilaterally Abd: obese Ext: no overt edema ACCESS: R TDC in place, dressing c/d/intact.  Imaging: Dg Chest Port 1 View  Result Date: 01/24/2017 CLINICAL DATA:  Intubated patient, acute respiratory failure. EXAM: PORTABLE CHEST 1 VIEW COMPARISON:  Portable chest x-ray of January 23, 2017 FINDINGS: The cardiac silhouette remains enlarged. The retrocardiac region remains dense with obscuration of the left hemidiaphragm. The interstitial markings remain increased in the pulmonary vascularity engorged. There is calcification in the wall of the aortic arch. The endotracheal tube tip lies 2.7 cm above the carina. The esophagogastric tube tip projects below the inferior margin of the image. The dual-lumen dialysis type catheter tip projects over the midportion of the SVC. The left internal jugular venous  catheter tip projects over the proximal SVC. IMPRESSION: CHF with pulmonary interstitial edema. Left lower lobe atelectasis or pneumonia. The support tubes are in reasonable position. There has not been significant interval change since yesterday's study. The support tubes are in reasonable position. Thoracic aortic atherosclerosis. Electronically Signed   By: David  Martinique M.D.   On: 01/24/2017 07:22   Dg Chest Port 1 View  Result Date: 01/23/2017 CLINICAL DATA:  Acute respiratory failure. EXAM: PORTABLE CHEST 1 VIEW COMPARISON:  One-view chest x-ray 01/22/2017 FINDINGS: Endotracheal tube is stable, 4.4 cm above the carina. NG tube courses off the inferior border the film. A left IJ line is stable. The right IJ dialysis catheter is stable. The heart is enlarged. Mild edema has slightly increased. Retrocardiac opacification remains. IMPRESSION: 1. Slight increase and mild edema. 2. Stable retrocardiac opacification likely reflecting a combination of atelectasis and effusion. Infection is not excluded. 3. The support apparatus is stable. Electronically Signed   By: San Morelle M.D.   On: 01/23/2017 07:58    Labs: BMET  Recent Labs Lab 01/21/17 0444 01/21/17 1822 01/22/17 0412 01/22/17 1546 01/23/17 0404 01/23/17 1700 01/24/17 0347 01/24/17 0550  NA 134* 133* 133* 132* 134* 136 137 135  136  K 4.7 4.4 4.4 4.3 4.3 4.3 4.2 4.0  3.9  CL 99* 97*  97* 97* 99* 98* 101 100*  97*  CO2 17* 17* 18* 19* 20* 21* 22  --   GLUCOSE 91 113* 112* 94 103* 94 108* 132*  169*  BUN 34* 35* 37* 36* 31* 25* 24* 24*  21*  CREATININE 6.72* 7.09* 7.45* 6.77* 5.19* 4.26* 3.63* 3.80*  2.90*  CALCIUM 9.8 9.7 9.7 9.2 9.0 8.8* 8.3*  --   PHOS 6.1* 6.2* 5.8* 4.4 3.5 3.0 2.9  --    CBC  Recent Labs Lab 01/21/17 0445 01/22/17 0412 01/23/17 0404 01/23/17 0821 01/24/17 0347 01/24/17 0550  WBC 7.6 9.9 8.5  --  6.7  --   HGB 8.9* 9.4* 10.0*  --  9.3* 10.5*  11.2*  HCT 30.0* 31.2* 31.7*  --  29.8*  31.0*  33.0*  MCV 90.4 88.9 86.8  --  88.7  --   PLT 153 126* 75* 73* 39*  --     Medications:    . ampicillin (OMNIPEN) IV  2 g Intravenous Q8H  . cefTRIAXone (ROCEPHIN)  IV  2 g Intravenous Q12H  . chlorhexidine gluconate (MEDLINE KIT)  15 mL Mouth Rinse BID  . Chlorhexidine Gluconate Cloth  6 each Topical Daily  . darbepoetin (ARANESP) injection - NON-DIALYSIS  150 mcg Subcutaneous Q Tue-1800  . famotidine (PEPCID) IV  20 mg Intravenous Q24H  . fluticasone  1 spray Each Nare BID  . mouth rinse  15 mL Mouth Rinse 10 times per day  . midodrine  10 mg Oral TID WC      Madelon Lips, MD 01/24/2017, 7:33 AM

## 2017-01-24 NOTE — Progress Notes (Signed)
Patient went into Afib RVR with a rate of 130s-140s during bath. Warren Lacy, MD called and notified. No new orders given at this time.   CRRT filter clotted. Will initiate citrate protocol.

## 2017-01-24 NOTE — Progress Notes (Signed)
[name], female   DOB: 08/30/1956, 61 y.o.   MRN: [id number]     Advanced Heart Failure Rounding Note  PCP: Kerin Perna, NP  Primary Cardiologist: Dr. Gwenlyn Found   Subjective:    s/p failed DCCV 3/28 and 4/5. Amio switched to po 01/18/17 after > 16 g load of IV.     01/20/17 was thought to be mildly confused from baseline, thought to be 2/2 worsening renal function.  Pt underwent DCCV with successful return to NSR.   Pt noted to be difficult to arouse from sedation, and with on-going lethargy and "garbled speech" CT head ordered which showed left frontal intracranial hemorrhage.  Heparin/coumadin stopped.    MRI head 4/13 showed small left frontal intracerebral hemorrhage with parafalcine small SDH, trace rightward midline shift.  No CVA.  EEG suggestive of metabolic encephalopathy.  Neurology thinks altered mental status not related to ICH/SDH (small).    Intubated 4/13 for airway control.     CVVHD stopped 01/16/17.CVVH restarted 4/14  Sedated on vent. Will wake up but is agitated. Not following commands. CVP 8. CVVHD filter clotted 2x last night. On 24 mcg Levo. Went back into Afib with RVR  last night rates 130-140 (had been in NSR since DCCV on 01/20/17). Remains on Amio gtt. No heparin with ICH. Weight down ~ 120 pounds since admission.     Objective:   Weight Range: 288 lb 12.8 oz (131 kg) Body mass index is 43.91 kg/m.   Vital Signs:   Temp:  [98 F (36.7 C)-99.4 F (37.4 C)] 98.9 F (37.2 C) (04/16 0400) Pulse Rate:  [31-115] 31 (04/16 0700) Resp:  [12-23] 20 (04/16 0700) BP: (82-110)/(47-86) 104/86 (04/16 0700) SpO2:  [94 %-100 %] 100 % (04/16 0700) Arterial Line BP: (99-144)/(42-61) 125/60 (04/16 0700) FiO2 (%):  [40 %] 40 % (04/16 0400) Weight:  [288 lb 12.8 oz (131 kg)] 288 lb 12.8 oz (131 kg) (04/16 0200) Last BM Date: 01/23/17  Weight change: Filed Weights   01/22/17 0800 01/23/17 0300 01/24/17 0200  Weight: 293 lb 3.4 oz (133 kg) 286  lb 9.6 oz (130 kg) 288 lb 12.8 oz (131 kg)    Intake/Output:   Intake/Output Summary (Last 24 hours) at 01/24/17 0707 Last data filed at 01/24/17 0700  Gross per 24 hour  Intake          1892.54 ml  Output             2825 ml  Net          -932.46 ml     Physical Exam: General:Obese female, intubated.Sedated will arouse to voice but not follow commands HEENT: Normocephalic, atraumatic.  Neck: Thick, LIJ in place. JVP 8-9 cm.   Cor: heart sounds distant, irregularly irregular rhythm. PMI not palpable.   Lungs: Rhonchi in bilateral upper lobes anteriorly.  Abdomen: Obese, soft, nontender.  No bruits or masses. +BS   Extremities: No cyanosis, clubbing, rash. Trace pedal edema.  Neuro: Intubated, sedated. Not able to assess.   Telemetry: Afib rates in the 110-140s   Labs: CBC  Recent Labs  01/23/17 0404 01/23/17 0821 01/24/17 0347 01/24/17 0550  WBC 8.5  --  6.7  --   HGB 10.0*  --  9.3* 10.5*  11.2*  HCT 31.7*  --  29.8* 31.0*  33.0*  MCV 86.8  --  88.7  --   PLT 75* 73* 39*  --    Basic Metabolic Panel  Recent Labs  01/22/17 0412  01/23/17 1700 01/24/17 0347 01/24/17 0550  NA 133*  < > 136 137 135  136  K 4.4  < > 4.3 4.2 4.0  3.9  CL 97*  < > 98* 101 100*  97*  CO2 18*  < > 21* 22  --   GLUCOSE 112*  < > 94 108* 132*  169*  BUN 37*  < > 25* 24* 24*  21*  CREATININE 7.45*  < > 4.26* 3.63* 3.80*  2.90*  CALCIUM 9.7  < > 8.8* 8.3*  --   MG 2.3  --   --  2.3  --   PHOS 5.8*  < > 3.0 2.9  --   < > = values in this interval not displayed.  BNP: BNP (last 3 results)  Recent Labs  09/18/16 1126 11/07/16 1633 12/26/16 1643  BNP 991.0* 490.4* 1,116.0*    Medications:     Scheduled Medications: . ampicillin (OMNIPEN) IV  2 g Intravenous Q8H  . cefTRIAXone (ROCEPHIN)  IV  2 g Intravenous Q12H  . chlorhexidine gluconate (MEDLINE KIT)  15 mL Mouth Rinse BID  . Chlorhexidine Gluconate Cloth  6 each Topical Daily  . darbepoetin (ARANESP) injection  - NON-DIALYSIS  150 mcg Subcutaneous Q Tue-1800  . famotidine (PEPCID) IV  20 mg Intravenous Q24H  . fluticasone  1 spray Each Nare BID  . mouth rinse  15 mL Mouth Rinse 10 times per day  . midodrine  10 mg Oral TID WC    Infusions: . amiodarone 30 mg/hr (01/24/17 0700)  . calcium gluconate infusion for CRRT 20 g (01/24/17 0700)  . epinephrine Stopped (01/22/17 0953)  . fentaNYL infusion INTRAVENOUS 100 mcg/hr (01/24/17 0700)  . norepinephrine (LEVOPHED) Adult infusion 24 mcg/min (01/24/17 0700)  . phenylephrine (NEO-SYNEPHRINE) Adult infusion Stopped (01/21/17 1000)  . dialysis replacement fluid (prismasate) 200 mL/hr at 01/23/17 1151  . dialysate (PRISMASATE) 1,000 mL/hr at 01/24/17 0551  . sodium citrate 2 %/dextrose 2.5% solution 3000 mL 270 mL/hr at 01/24/17 0552    PRN Medications: Place/Maintain arterial line **AND** sodium chloride, acetaminophen, diphenhydrAMINE-zinc acetate, fentaNYL, midazolam, senna-docusate, sodium chloride  Assessment/Plan   1. Acute on chronic systolic/diastolic CHF: Down >703 lbs with CVVHD initially.  EF 25-30% on TEE 4/5 (down from 55% in 3/18) ->likely tachy mediated from Afib.  - No beta blocker, requiring pressors. - No ACE/ARB with AKI.  - Remains on norepi, now at 54mg.   - Consider palliative care consult. 2. 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  MRA/MRV did not show relevant findings.  EEG showed evidence for metabolic encephalopathy.  Per neuro, ICH/SDH does not explain her degree of altered mental status, suspect metabolic encephalopathy in setting of renal failure played a role.  - Has a chronic SDH noted which is likely from fall in Dec. 2017. 3. Paroxysmal atrial fibrillation: s/p DC-CV on 3/27 and failed again on 01/13/17. Repeat DCCV on 01/20/17 remained in NSR until last night, now back in Afib.   - No anticoagulation with SDH/ICH - Continue amiodarone gtt.  - May resume  anticoagulation once blood is absorbed and patient stabilized.  4. AKI on CKD:  CVVHD stopped 01/16/17.  She was anuric, started back on CVVH 4/14 pm.  - Nursing reports they are able to pull about 544man hour. Would not pull too much as it will limit ability to wean pressors.  5. Obesity hypoventilation syndrome.  6.  Septic Shock- Blood CX--> VRE - Repeat BCx negative for 5 days. ID following.  Ceftriaxone/ampicillin to 4/18.  - TEE 01/13/17 without endocarditis. No change.  7. 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 No change.   8. Acute on chronic respiratory failure: She was intubated for airway control. 9. Anemia: Hgb 10.5  Anticoags held with head bleed.  10. NSVT: Quiescent. Electrolytes stable  11. Thrombocytopenia - platelets down to 39k. No obvious ongoing bleeding. Heme seeing  Arbutus Leas, NP  7:07 AM  Advanced Heart Failure Team  Pager [phone] M-F 7am-4pm.  Please contact [facility] for night-coverage after hours (4p -7a ) and weekends on [email or web address]  Agree with above.   She remains critically ill. Now intubated and sedated. Will arise to voice but not following commands. Neuro feels that mental status changes out of proportion to [place]. Hopefully will improve with resumption of dialysis. Remains on significant dose of norepi. Will wean as tolerated. Will avoid overdiuresis with CVVHD as she is preload dependent. Now back in AF. Continue IV amiodarone. Off heparin due to [place].  On exam she remains on vent. Sedated. Will arise to voice but not follow commands. On CVVHD. Volume status looks ok. Back in AF with RVR. Extremities warm  Overall prognosis seems progressively grim with multi-system organ failure and ICH. Will continue supportive care and try to wean NE and vent. If unable to wean in the next 48-72 hours will have to discuss switch to comfort care.   The patient is critically ill with multiple organ systems failure and requires  high complexity decision making for assessment and support, frequent evaluation and titration of therapies, application of advanced monitoring technologies and extensive interpretation of multiple databases.   Critical Care Time devoted to patient care services described in this note is 35 Minutes. This time reflects time of care of this signee, Dr. Pierre Bali who personally examined the patient and determined the plan of care. This critical care time does not reflect procedure time, teaching time or supervisory time of our PA/NP/resident but could involve care discussion and coordination time.  Glori Bickers, MD  8:59 PM

## 2017-01-24 NOTE — Consult Note (Signed)
[name]    HEMATOLOGY/ONCOLOGY CONSULTATION NOTE  Date of Service: 01/24/2017  Patient Care Team: Kerin Perna, NP as PCP - General (Internal Medicine)  CHIEF COMPLAINTS/PURPOSE OF CONSULTATION:  Thrombocytopenia   HISTORY OF PRESENTING ILLNESS:   Paige Marshall is a wonderful 61 y.o. female who has been referred to [place] by Dr Halford Chessman for evaluation and management of thrombocytopenia.  Patient has a history of multiple medical comorbidities including hypertension, dyslipidemia, morbid obesity, gout, COPD, chronic kidney disease, systolic and diastolic CHF, rheumatoid arthritis , atrial fibrillation who was admitted from rehabilitation due to shortness of breath thought to be related to CHF decompensation. She apparently needed CRRT in the setting of acute on chronic kidney disease. Noted to have VRE bacteremia treated with antibiotics. She developed altered mental status on 01/20/2017 with aphagia and a CT scan of the head was done which showed mixed density masslike area over the surface of the left anterior frontal lobe thought to be subdural/epidural hematoma/hygroma. MRI of the brain was done which showed left-sided subdural hematoma unchanged from previous. Trace right frontal subdural hematoma.  Patient's platelet counts were 252k on 01/17/2017 and then progressively dropped to 177k on 4/12 153k on 4/13 126k on 4/14 75k on 4/15 and 39k on 01/24/2018. Patient had been on IV heparin until yesterday and this was discontinued.  DIC panel showed elevated PT of 16.6 elevated PTT of 62 with a ferritin level of 311 with more than 20 quantitative d-dimer with some schistocytes. This was consistent with DIC. Haptoglobin within normal limits. LDH was elevated.  Patient was on ceftriaxone and ampicillin for antibiotics. Heparin-induced antibody panel was sent yesterday and was noted to be 1.12 - indeterminate range.  Repeat fibrinogen done today is downtrending and is down to 231. PTT is still  abnormal at 66.  Patient remains intubated on the ventilator on pressors and was getting hemodialysis with citrate when I saw her in the ICU.  MEDICAL HISTORY:  Past Medical History:  Diagnosis Date  . Asthma   . Chronic combined systolic and diastolic CHF (congestive heart failure) ([place])   . CKD (chronic kidney disease), stage III   . COPD (chronic obstructive pulmonary disease) ([place])   . Degenerative joint disease of both lower legs   . Former smoker   . Gout    hx in "both knees" (01/14/2015)  . H. pylori infection 01/03/2014   +breath test.    . High cholesterol   . Hypertension   . Morbid obesity ([place])   . NICM (nonischemic cardiomyopathy) ([place])    a. LHC 01/2015: normal cors, EF 45-50%. b. EF 50-55% in 08/2016. c. EF 33% 01/2017  . OSA (obstructive sleep apnea)   . Persistent atrial fibrillation ([place])   . Pneumonia X 2  . Pulmonary hypertension ([place])   . Rheumatoid arthritis ([place])   . Stroke [facility])     SURGICAL HISTORY: Past Surgical History:  Procedure Laterality Date  . ANGIOGRAM/LV (CONGENITAL)  2007  . BREATH TEK H PYLORI N/A 12/31/2013   Procedure: BREATH TEK H PYLORI;  Surgeon: Gayland Curry, MD;  Location: [facility];  Service: General;  Laterality: N/A;  . CARDIOVERSION N/A 01/13/2017   Procedure: CARDIOVERSION;  Surgeon: Jolaine Artist, MD;  Location: [facility] ENDOSCOPY;  Service: Cardiovascular;  Laterality: N/A;  . CARDIOVERSION N/A 01/20/2017   Procedure: CARDIOVERSION;  Surgeon: Larey Dresser, MD;  Location: [place];  Service: Cardiovascular;  Laterality: N/A;  . CORNEAL TRANSPLANT Right ~ 2010  .  CORONARY ANGIOPLASTY WITH STENT PLACEMENT  11/04/2005   "1"  . DOPPLER ECHOCARDIOGRAPHY     2 D   EF of 45%  . EYE SURGERY    . IR GENERIC HISTORICAL  01/06/2017   IR FLUORO GUIDE CV LINE RIGHT 01/06/2017 Corrie Mckusick, DO [facility]  . IR GENERIC HISTORICAL  01/06/2017   IR US GUIDE VASC ACCESS RIGHT 01/06/2017 Corrie Mckusick, DO [facility]  . LEFT HEART  CATHETERIZATION WITH CORONARY ANGIOGRAM N/A 01/20/2015   Procedure: LEFT HEART CATHETERIZATION WITH CORONARY ANGIOGRAM;  Surgeon: Lorretta Harp, MD;  Location: [facility] CATH LAB;  Service: Cardiovascular;  Laterality: N/A;  . RIGHT HEART CATH N/A 01/07/2017   Procedure: Right Heart Cath;  Surgeon: Jolaine Artist, MD;  Location: [place] CV LAB;  Service: Cardiovascular;  Laterality: N/A;  . sleep study  2011  . stress myocardial dipyridamole perfusion    . TEE WITHOUT CARDIOVERSION N/A 01/13/2017   Procedure: TRANSESOPHAGEAL ECHOCARDIOGRAM (TEE);  Surgeon: Jolaine Artist, MD;  Location: [place];  Service: Cardiovascular;  Laterality: N/A;  . [place]  . venous duplex ultrasound  2013    SOCIAL HISTORY: Social History   Social History  . Marital status: Widowed    Spouse name: N/A  . Number of children: N/A  . Years of education: N/A   Occupational History  . Not on file.   Social History Main Topics  . Smoking status: Former Smoker    Packs/day: 0.33    Years: 35.00    Types: Cigarettes    Quit date: 10/11/2008  . Smokeless tobacco: Never Used  . Alcohol use No     Comment: 01/14/2015 "~ 4, 24oz cans beer/wk"  . Drug use: No  . Sexual activity: No   Other Topics Concern  . Not on file   Social History Narrative  . No narrative on file    FAMILY HISTORY: Family History  Problem Relation Age of Onset  . Heart disease Mother   . Cancer Father     colon  . Hypertension Other     ALLERGIES:  is allergic to penicillins and citrus.  MEDICATIONS:  Current Facility-Administered Medications  Medication Dose Route Frequency Provider Last Rate Last Dose  . 0.9 %  sodium chloride infusion   Intra-arterial PRN Javier Glazier, MD   Stopped at 01/24/17 0330  . acetaminophen (TYLENOL) solution 650 mg  650 mg Per Tube Q6H PRN Chesley Mires, MD      . amiodarone (NEXTERONE PREMIX) 360-4.14 MG/200ML-% (1.8 mg/mL) IV infusion  30 mg/hr Intravenous Continuous  Rudean Curt, MD 16.7 mL/hr at 01/24/17 1400 30 mg/hr at 01/24/17 1400  . ampicillin (OMNIPEN) 2 g in sodium chloride 0.9 % 50 mL IVPB  2 g Intravenous Q8H Lyndee Leo, RPH   2 g at 01/24/17 1051  . calcium gluconate 20 g in dextrose 5 % 1,000 mL infusion  20 g CRRT Continuous Jamal Maes, MD 60 mL/hr at 01/24/17 1400 20 g at 01/24/17 1400  . cefTRIAXone (ROCEPHIN) 2 g in dextrose 5 % 50 mL IVPB  2 g Intravenous Q12H Truman Hayward, MD   2 g at 01/24/17 1008  . chlorhexidine gluconate (MEDLINE KIT) (PERIDEX) 0.12 % solution 15 mL  15 mL Mouth Rinse BID Juanito Doom, MD   15 mL at 01/24/17 0800  . Chlorhexidine Gluconate Cloth 2 % PADS 6 each  6 each Topical Daily Truman Hayward, MD  6 each at 01/24/17 0200  . diphenhydrAMINE-zinc acetate (BENADRYL) 2-0.1 % cream   Topical Daily PRN Juanito Doom, MD   1 application at 16/10/96 2138  . feeding supplement (PRO-STAT SUGAR FREE 64) liquid 60 mL  60 mL Per Tube BID Juanito Doom, MD      . feeding supplement (VITAL HIGH PROTEIN) liquid 1,000 mL  1,000 mL Per Tube Continuous Juanito Doom, MD      . fentaNYL (SUBLIMAZE) bolus via infusion 50 mcg  50 mcg Intravenous Q1H PRN Praveen Mannam, MD   50 mcg at 01/24/17 1430  . fentaNYL 2581mg in NS 2585m(1025mml) infusion-PREMIX  25-400 mcg/hr Intravenous Continuous Praveen Mannam, MD 5 mL/hr at 01/24/17 1430 50 mcg/hr at 01/24/17 1430  . MEDLINE mouth rinse  15 mL Mouth Rinse 10 times per day DouJuanito DoomD   15 mL at 01/24/17 1404  . midazolam (VERSED) injection 2 mg  2 mg Intravenous Q2H PRN Praveen Mannam, MD   2 mg at 01/22/17 1430  . midodrine (PROAMATINE) tablet 10 mg  10 mg Oral TID WC JamMauricia AreaD   10 mg at 01/24/17 1102  . norepinephrine (LEVOPHED) 16 mg in dextrose 5 % 250 mL (0.064 mg/mL) infusion  0-40 mcg/min Intravenous Titrated DouJuanito DoomD 22.5 mL/hr at 01/24/17 1427 24 mcg/min at 01/24/17 1427  . pantoprazole sodium (PROTONIX)  40 mg/20 mL oral suspension 40 mg  40 mg Per Tube Q24H VinChesley MiresD   40 mg at 01/24/17 1102  . prismasol B22GK 4/0 5,000 mL dialysis solution   CRRT Continuous EliMadelon LipsD 2,000 mL/hr at 01/24/17 1418    . prismasol BGK 4/2.5 5,000 mL dialysis replacement fluid   CRRT Continuous AlvEstanislado EmmsD 200 mL/hr at 01/23/17 1151    . senna-docusate (Senokot-S) tablet 2 tablet  2 tablet Oral QHS PRN Alexa R BAngela BurkeD      . sodium citrate 2 %/dextrose 2.5% solution 3000 mL   CRRT Continuous CynJamal MaesD 340 mL/hr at 01/24/17 1415      REVIEW OF SYSTEMS:    10 Point review of Systems was done is negative except as noted above.  PHYSICAL EXAMINATION: ECOG PERFORMANCE STATUS: 4 - Bedbound  . Vitals:   01/24/17 1300 01/24/17 1400  BP: 106/83 105/90  Pulse: 93 (!) 25  Resp: (!) 26 (!) 25  Temp:     Filed Weights   01/22/17 0800 01/23/17 0300 01/24/17 0200  Weight: 293 lb 3.4 oz (133 kg) 286 lb 9.6 oz (130 kg) 288 lb 12.8 oz (131 kg)   .Body mass index is 43.91 kg/m.  GENERAL:Sedated and intubated on a ventilator on pressors  EYES: Closed  OROPHARYNX: MMM, endotracheal tube in situ NECK: Trachea JVD JVD LYMPH:  no palpable lymphadenopathy in the cervical, axillary or inguinal regions LUNGS: clear to auscultation b/l with normal respiratory effort HEART: Irregular ABDOMEN:  normoactive bowel sounds , non tender, not distended. Extremity: Bilateral pitting pedal edema NEURO: Sedated and intubated on a ventilator  LABORATORY DATA:  I have reviewed the data as listed  . CBC Latest Ref Rng & Units 01/24/2017 01/24/2017 01/24/2017  WBC 4.0 - 10.5 K/uL - - -  Hemoglobin 12.0 - 15.0 g/dL 11.2(L) 10.5(L) 11.9(L)  Hematocrit 36.0 - 46.0 % 33.0(L) 31.0(L) 35.0(L)  Platelets 150 - 400 K/uL - - -    . CMP Latest Ref Rng & Units 01/24/2017 01/24/2017 01/24/2017  Glucose 65 - 99  mg/dL 190(H) 141(H) 131(H)  BUN 6 - 20 mg/dL 22(H) 30(H) 24(H)  Creatinine 0.44 - 1.00 mg/dL  2.60(H) 3.10(H) 3.17(H)  Sodium 135 - 145 mmol/L 137 136 138  Potassium 3.5 - 5.1 mmol/L 3.8 4.1 4.2  Chloride 101 - 111 mmol/L 91(L) 94(L) 97(L)  CO2 22 - 32 mmol/L - - 25  Calcium 8.9 - 10.3 mg/dL - - 8.9  Total Protein 6.5 - 8.1 g/dL - - -  Total Bilirubin 0.3 - 1.2 mg/dL - - -  Alkaline Phos 38 - 126 U/L - - -  AST 15 - 41 U/L - - -  ALT 14 - 54 U/L - - -     RADIOGRAPHIC STUDIES: I have personally reviewed the radiological images as listed and agreed with the findings in the report. Dg Chest 1 View  Result Date: 01/21/2017 CLINICAL DATA:  Status post intubation today. EXAM: CHEST 1 VIEW COMPARISON:  Single-view of the chest earlier today. FINDINGS: New NG tube courses into the stomach and below the inferior margin of film. New endotracheal tube is in place with the tip at the level of clavicular heads in good position well above the carina. Right IJ approach dialysis catheter is again seen. Cardiomegaly and interstitial edema are unchanged. Atherosclerosis is noted. IMPRESSION: ETT and NG tube projecting good position. Cardiomegaly and mild interstitial edema. Electronically Signed   By: Inge Rise M.D.   On: 01/21/2017 12:01   Ct Head Wo Contrast  Result Date: 01/21/2017 CLINICAL DATA:  Slurred speech. Recent hospitalization for atrial fibrillation, on heparin. Altered mental status. EXAM: CT HEAD WITHOUT CONTRAST TECHNIQUE: Contiguous axial images were obtained from the base of the skull through the vertex without intravenous contrast. COMPARISON:  01/20/2017. FINDINGS: Brain: Redemonstrated is a LEFT frontal mixed attenuation collection, biconvex on coronal imaging, with low attenuation fluid anteriorly, and hyperdense acute hemorrhage posteriorly. Maximum thickness of between 13 and 15 mm. Acute LEFT parafalcine subdural hemorrhage redemonstrated as well, small volume. No clear areas of cerebral infarction or vasogenic edema. 2 mm of LEFT-to-RIGHT shift. Vascular: No hyperdense  vessel or unexpected calcification. Skull: Normal. Negative for fracture or focal lesion. Sinuses/Orbits: No acute finding. Other: None. IMPRESSION: Stable LEFT frontal mixed attenuation collections, as described above. 2 mm LEFT-to-RIGHT shift. I believe the collections could both be extra-axial, representing subdural/epidural hematoma/hygroma. Continued surveillance warranted. Patient could not cooperate for MRI. If clinically indicated, repeat could be performed with sedation. Electronically Signed   By: Staci Righter M.D.   On: 01/21/2017 10:18   Ct Head Wo Contrast  Addendum Date: 01/20/2017   ADDENDUM REPORT: 01/20/2017 16:18 ADDENDUM: Study discussed by telephone with Dr. Evette Doffing on the Internal Medicine Team On 01/20/2017 at 1545 hours. He advised that the patient has been hospitalized in the ICU and on heparin recently for a fib. This factor argues against the possibility of venous thrombosis, and in favor of a coagulopathy related bleed. No recent trauma. He plans a follow-up Brain MRI without and with contrast (I advised contrast even though the patient is recently on dialysis) when feasible. Electronically Signed   By: Genevie Ann M.D.   On: 01/20/2017 16:18   Result Date: 01/20/2017 CLINICAL DATA:  61 year old female with mental status changes since this morning and increased confusion throughout the day. Dialysis patient. EXAM: CT HEAD WITHOUT CONTRAST TECHNIQUE: Contiguous axial images were obtained from the base of the skull through the vertex without intravenous contrast. COMPARISON:  Head CT without contrast 10/02/2009 FINDINGS: Brain: In  2010 the left frontal lobe appeared within normal limits. Now there is both a 3 cm oval area of hypodensity within the anterior left frontal lobe (series 3, image 17), and an adjacent mixed density rounded hemorrhage or mass like area of about 2.6 cm. There does appear to be a small volume of superimposed left parasagittal subdural hemorrhage up to 7 mm in  thickness. There is not an area of vasogenic edema in the left frontal lobe. Outside of the mixed density abnormality in the anterior left frontal lobe gray and white matter differentiation appears normal throughout the brain. No significant intracranial mass effect. No ventriculomegaly. Normal basilar cisterns. Partially empty sella. No cortical encephalomalacia identified. Vascular: Aside from hyperdensity involving the left superior sagittal sinus at the area of presumed subdural blood there is no suspicious intracranial vascular hyperdensity. Skull: Hyperostosis of the calvarium. Small 6-7 mm oval lucent lesions in the left occipital bone (series 4, image 24) and left frontal bone (image 44) are identified. However, the left frontal bone lesion is unchanged from 2010, and the left occipital bone lesion was probably present at that time although much smaller. No definite destructive osseous lesion. Sinuses/Orbits: Visualized paranasal sinuses and mastoids are stable and well pneumatized. Other: No acute orbit or scalp soft tissue finding. IMPRESSION: 1. Unusual appearing mixed density mass like area along the surface of the anterior frontal lobe, with cytotoxic type edema as well as a rounded 2 cm area of hemorrhage. Apparent small volume superimposed nearby a left parafalcine subdural blood. 2. Etiology is unclear at this point with top differential considerations including: trauma such as Hemorrhagic Contusion and SDH, Venous Infarct (superior cortical vein and/or short segment sagittal sinus thrombosis), and tumor. 3. Negative CT appearance of the brain elsewhere. 4. Recommend follow-up brain MRI, preferably without and with IV contrast, to further characterize. 5. Small lucent areas in the skull appear chronic and are probably benign - possibly hyperparathyroid or renal osteodystrophy related in the setting of chronic renal failure. Electronically Signed: By: Genevie Ann M.D. On: 01/20/2017 15:39   Mr Jodene Nam Head  Wo Contrast  Result Date: 01/22/2017 CLINICAL DATA:  Subdural hematoma following recent cardioversion. EXAM: MRA HEAD WITHOUT CONTRAST TECHNIQUE: Angiographic images of the Circle of Willis were obtained using MRA technique without intravenous contrast. COMPARISON:  MRI brain 01/21/2017 FINDINGS: Source images again demonstrate the left extra-axial hemorrhages. The internal carotid arteries are within normal limits from the high cervical segments through the ICA termini bilaterally. 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 The A1 and M1 segments are normal. The right A1 segment is dominant. The anterior communicating artery is patent. The MCA bifurcations are intact. ACA and MCA branch vessels are within normal limits. The left vertebral artery slightly dominant to the right. The left PICA origin is below normal. The vertebrobasilar junction is normal. The right AICA is dominant. The basilar artery is within normal limits. The posterior cerebral arteries originate the basilar tip. PCA branch vessels are unremarkable. IMPRESSION: 1. 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 2. Otherwise normal MRA circle of Willis without significant proximal stenosis or occlusion. No other aneurysms are present. Electronically Signed   By: San Morelle M.D.   On: 01/22/2017 08:38   Mr Brain Wo Contrast  Result Date: 01/21/2017 CLINICAL DATA:  Altered mental status.  Intracranial hemorrhage. EXAM: MRI HEAD WITHOUT CONTRAST TECHNIQUE: Multiplanar, multiecho pulse sequences of the brain and surrounding structures were obtained without intravenous contrast. COMPARISON:  Head CT 01/21/2017  FINDINGS: The examination had to be discontinued prior to completion, and axial T1 and coronal T2 sequences were not obtained. Brain: Left-sided subdural hematoma is greatest over the frontal convexity and measures up to 12 mm in thickness, unchanged from the CT earlier today. The more anterior component is  T1, T2, and FLAIR hyperintense and simpler in appearance, corresponding to the low-density fluid on CT. At the posterior aspect of this are more complex appearing blood products, corresponding to the hyperdense hemorrhage on CT. Minimal subdural hematoma extends posteriorly over the left cerebral convexity as well as into the middle cranial fossa over the temporal lobe and into the interhemispheric fissure. There is also trace subdural hematoma over the anterior right frontal lobe. The left-sided hematoma results in mild left cerebral hemispheric sulcal effacement and minimal rightward midline shift of 3 mm, unchanged. There is no acute infarct. No significant cerebral white matter disease or brain edema is identified. Cerebral volume is within normal limits for age. Vascular: Major intracranial vascular flow voids are preserved. Skull and upper cervical spine: Decreased bone marrow signal intensity likely reflects known underlying anemia. Sinuses/Orbits: Unremarkable orbits. Fluid in the nasopharynx. Clear paranasal sinuses. Trace bilateral mastoid fluid. Other: None. IMPRESSION: 1. Slightly incomplete noncontrast brain MRI. 2. Left-sided subdural hematoma, unchanged from earlier CT. Trace rightward midline shift. 3. Trace right frontal subdural hematoma. 4. No acute infarct. Electronically Signed   By: Logan Bores M.D.   On: 01/21/2017 15:53   US Renal  Result Date: 12/26/2016 CLINICAL DATA:  Acute on chronic renal failure EXAM: RENAL / URINARY TRACT ULTRASOUND COMPLETE COMPARISON:  None. FINDINGS: Right Kidney: Length: 12.1 cm. Echogenicity within normal limits. No mass or hydronephrosis visualized. Left Kidney: Length: 11.1 cm. Echogenicity within normal limits. No mass or hydronephrosis visualized. Bladder: The bladder is nondistended. The patient voided immediately before the examination. Other: Moderate volume of lower abdominal ascites. IMPRESSION: 1. Normal appearance of the kidneys. 2. Moderate lower  abdominal ascites. Electronically Signed   By: Ulyses Jarred M.D.   On: 12/26/2016 21:53   Ir Fluoro Guide Cv Line Right  Result Date: 01/06/2017 INDICATION: 61 year old female with renal failure EXAM: TUNNELED CENTRAL VENOUS HEMODIALYSIS CATHETER PLACEMENT WITH ULTRASOUND AND FLUOROSCOPIC GUIDANCE MEDICATIONS: Vancomycin 1.5 gm IV . The antibiotic was given in an appropriate time interval prior to skin puncture. ANESTHESIA/SEDATION: Moderate (conscious) sedation was employed during this procedure. A total of Versed 2 point mg and Fentanyl 100 mcg was administered intravenously. Moderate Sedation Time: 20 minutes. The patient's level of consciousness and vital signs were monitored continuously by radiology nursing throughout the procedure under my direct supervision. FLUOROSCOPY TIME:  Fluoroscopy Time: 0 minutes 12 seconds (2 mGy). COMPLICATIONS: None PROCEDURE: Informed written consent was obtained from the patient after a discussion of the risks, benefits, and alternatives to treatment. Questions regarding the procedure were encouraged and answered. The right neck and chest were prepped with chlorhexidine in a sterile fashion, and a sterile drape was applied covering the operative field. Maximum barrier sterile technique with sterile gowns and gloves were used for the procedure. A timeout was performed prior to the initiation of the procedure. After creating a small venotomy incision, a micropuncture kit was utilized to access the right internal jugular vein under direct, real-time ultrasound guidance after the overlying soft tissues were anesthetized with 1% lidocaine with epinephrine. Ultrasound image documentation was performed. The microwire was marked to measure appropriate internal catheter length. External tunneled length was estimated. A total tip to cuff length of  19 cm was selected. Skin and subcutaneous tissues of chest wall below the clavicle were generously infiltrated with 1% lidocaine for local  anesthesia. A small stab incision was made with 11 blade scalpel. The selected hemodialysis catheter was tunneled in a retrograde fashion from the anterior chest wall to the venotomy incision. A guidewire was advanced to the level of the IVC and the micropuncture sheath was exchanged for a peel-away sheath. The catheter was then placed through the peel-away sheath with tips ultimately positioned within the superior aspect of the right atrium. Final catheter positioning was confirmed and documented with a spot radiographic image. The catheter aspirates and flushes normally. The catheter was flushed with appropriate volume heparin dwells. The catheter exit site was secured with a 0-Prolene retention suture. The venotomy incision was closed Derma bond and sterile dressing. Dressings were applied at the chest wall. Patient tolerated the procedure well and remained hemodynamically stable throughout. No complications were encountered and no significant blood loss encountered. IMPRESSION: Status post right IJ tunneled hemodialysis catheter, 19 cm tip to cuff. Catheter ready for use. Signed, Dulcy Fanny. Earleen Newport, DO [facility] Electronically Signed   By: Corrie Mckusick D.O.   On: 01/06/2017 17:38   Ir US Guide Vasc Access Right  Result Date: 01/06/2017 INDICATION: 61 year old female with renal failure EXAM: TUNNELED CENTRAL VENOUS HEMODIALYSIS CATHETER PLACEMENT WITH ULTRASOUND AND FLUOROSCOPIC GUIDANCE MEDICATIONS: Vancomycin 1.5 gm IV . The antibiotic was given in an appropriate time interval prior to skin puncture. ANESTHESIA/SEDATION: Moderate (conscious) sedation was employed during this procedure. A total of Versed 2 point mg and Fentanyl 100 mcg was administered intravenously. Moderate Sedation Time: 20 minutes. The patient's level of consciousness and vital signs were monitored continuously by radiology nursing throughout the procedure under my direct  supervision. FLUOROSCOPY TIME:  Fluoroscopy Time: 0 minutes 12 seconds (2 mGy). COMPLICATIONS: None PROCEDURE: Informed written consent was obtained from the patient after a discussion of the risks, benefits, and alternatives to treatment. Questions regarding the procedure were encouraged and answered. The right neck and chest were prepped with chlorhexidine in a sterile fashion, and a sterile drape was applied covering the operative field. Maximum barrier sterile technique with sterile gowns and gloves were used for the procedure. A timeout was performed prior to the initiation of the procedure. After creating a small venotomy incision, a micropuncture kit was utilized to access the right internal jugular vein under direct, real-time ultrasound guidance after the overlying soft tissues were anesthetized with 1% lidocaine with epinephrine. Ultrasound image documentation was performed. The microwire was marked to measure appropriate internal catheter length. External tunneled length was estimated. A total tip to cuff length of 19 cm was selected. Skin and subcutaneous tissues of chest wall below the clavicle were generously infiltrated with 1% lidocaine for local anesthesia. A small stab incision was made with 11 blade scalpel. The selected hemodialysis catheter was tunneled in a retrograde fashion from the anterior chest wall to the venotomy incision. A guidewire was advanced to the level of the IVC and the micropuncture sheath was exchanged for a peel-away sheath. The catheter was then placed through the peel-away sheath with tips ultimately positioned within the superior aspect of the right atrium. Final catheter positioning was confirmed and documented with a spot radiographic image. The catheter aspirates and flushes normally. The catheter was flushed with appropriate volume heparin dwells. The catheter exit site was secured with a 0-Prolene retention suture. The venotomy incision was closed Derma bond  and  sterile dressing. Dressings were applied at the chest wall. Patient tolerated the procedure well and remained hemodynamically stable throughout. No complications were encountered and no significant blood loss encountered. IMPRESSION: Status post right IJ tunneled hemodialysis catheter, 19 cm tip to cuff. Catheter ready for use. Signed, Dulcy Fanny. Earleen Newport, DO [facility] Radiology Electronically Signed   By: Corrie Mckusick D.O.   On: 01/06/2017 17:38   Dg Chest Port 1 View  Result Date: 01/24/2017 CLINICAL DATA:  Intubated patient, acute respiratory failure. EXAM: PORTABLE CHEST 1 VIEW COMPARISON:  Portable chest x-ray of January 23, 2017 FINDINGS: The cardiac silhouette remains enlarged. The retrocardiac region remains dense with obscuration of the left hemidiaphragm. The interstitial markings remain increased in the pulmonary vascularity engorged. There is calcification in the wall of the aortic arch. The endotracheal tube tip lies 2.7 cm above the carina. The esophagogastric tube tip projects below the inferior margin of the image. The dual-lumen dialysis type catheter tip projects over the midportion of the SVC. The left internal jugular venous catheter tip projects over the proximal SVC. IMPRESSION: CHF with pulmonary interstitial edema. Left lower lobe atelectasis or pneumonia. The support tubes are in reasonable position. There has not been significant interval change since yesterday's study. The support tubes are in reasonable position. Thoracic aortic atherosclerosis. Electronically Signed   By: David  Martinique M.D.   On: 01/24/2017 07:22   Dg Chest Port 1 View  Result Date: 01/23/2017 CLINICAL DATA:  Acute respiratory failure. EXAM: PORTABLE CHEST 1 VIEW COMPARISON:  One-view chest x-ray 01/22/2017 FINDINGS: Endotracheal tube is stable, 4.4 cm above the carina. NG tube courses off the inferior border the film. A left IJ line is stable. The right IJ dialysis  catheter is stable. The heart is enlarged. Mild edema has slightly increased. Retrocardiac opacification remains. IMPRESSION: 1. Slight increase and mild edema. 2. Stable retrocardiac opacification likely reflecting a combination of atelectasis and effusion. Infection is not excluded. 3. The support apparatus is stable. Electronically Signed   By: San Morelle M.D.   On: 01/23/2017 07:58   Dg Chest Port 1 View  Result Date: 01/22/2017 CLINICAL DATA:  Ventilator dependent EXAM: PORTABLE CHEST 1 VIEW COMPARISON:  01/21/2017 FINDINGS: Endotracheal tube, central venous lines and NG tube are unchanged. Stable enlarged cardiac silhouette. Mild central venous congestion similar prior. LEFT basilar atelectasis is increased. IMPRESSION: 1. Increase in LEFT basilar atelectasis. 2. Central venous pulmonary congestion similar prior. 3. Stable support apparatus. Electronically Signed   By: Suzy Bouchard M.D.   On: 01/22/2017 07:40   Dg Chest Port 1 View  Result Date: 01/21/2017 CLINICAL DATA:  Central line placement EXAM: PORTABLE CHEST 1 VIEW COMPARISON:  Portable exam 1300 hours compared 01/21/2017 FINDINGS: Tip of endotracheal tube projects 4.1 cm above carina. Nasogastric tube extends into stomach. RIGHT jugular dual-lumen catheter projects over cavoatrial junction. New RIGHT jugular central venous catheter with tip projecting over SVC. EKG leads project over chest. Enlargement of cardiac silhouette with pulmonary vascular congestion. Atherosclerotic calcification aorta. Slightly improved interstitial edema. No pleural effusion or pneumothorax. IMPRESSION: No pneumothorax following LEFT jugular line placement. Slightly improved pulmonary edema. Electronically Signed   By: Lavonia Dana M.D.   On: 01/21/2017 13:16   Dg Chest Port 1 View  Result Date: 01/21/2017 CLINICAL DATA:  Shortness of Breath EXAM: PORTABLE CHEST 1 VIEW COMPARISON:  01/17/2017 FINDINGS: Cardiac shadow remains enlarged. Dialysis  catheter is again seen. The left jugular central line is been  removed. The lungs are well aerated bilaterally. No focal infiltrate or sizable effusion is seen. Mild fullness of the pulmonary vasculature is noted likely related to a degree of volume overload. No bony abnormality is seen. IMPRESSION: Mild volume overload likely related to the underlying Clinical state. No new focal abnormality is seen. Electronically Signed   By: Inez Catalina M.D.   On: 01/21/2017 10:03   Dg Chest Port 1 View  Result Date: 01/17/2017 CLINICAL DATA:  Shortness of breath. EXAM: PORTABLE CHEST 1 VIEW COMPARISON:  Single-view of the chest 01/15/2017 and 01/10/2017. FINDINGS: There is marked cardiomegaly but the lungs are clear without edema. No pneumothorax or pleural effusion. Left IJ catheter and right IJ dialysis catheter are unchanged. Atherosclerosis is noted. IMPRESSION: Cardiomegaly without acute disease. Atherosclerosis. Electronically Signed   By: Inge Rise M.D.   On: 01/17/2017 10:22   Dg Chest Port 1 View  Result Date: 01/15/2017 CLINICAL DATA:  Central line placement EXAM: PORTABLE CHEST 1 VIEW COMPARISON:  Five days ago FINDINGS: Dialysis catheter on the right with tip at the SVC. Left IJ central line with tip at the upper SVC. Marked cardiomegaly. Interstitial opacity seen previously is essentially resolved. No asymmetric airspace disease, effusion, or pneumothorax. IMPRESSION: 1. Stable positioning of central lines, tips overlapping the SVC. 2. Pulmonary edema seen 5 days ago is improved/resolved. Electronically Signed   By: Monte Fantasia M.D.   On: 01/15/2017 07:31   Dg Chest Port 1 View  Result Date: 01/10/2017 CLINICAL DATA:  Central line placement. EXAM: PORTABLE CHEST 1 VIEW COMPARISON:  01/09/2017 FINDINGS: The right IJ catheter is stable. There is also a small caliber right-sided PICC line which is stable. New left IJ center venous catheter tip is in the mid SVC with the tip against the lateral  wall. No complicating features. Stable cardiac enlargement and diffuse pulmonary edema. No definite pleural effusions. IMPRESSION: Support apparatus in good position without complicating features. Persistent cardiac enlargement and pulmonary edema. Electronically Signed   By: Marijo Sanes M.D.   On: 01/10/2017 13:09   Dg Chest Port 1 View  Result Date: 01/09/2017 CLINICAL DATA:  Fever. Hx stroke, HTN, PNA, CKD, former smoker. EXAM: PORTABLE CHEST 1 VIEW COMPARISON:  01/04/2017 FINDINGS: Since prior exam, the left internal jugular central venous line has been removed. A new tunneled right internal jugular central venous line has been placed. The tip projects the right atrium. The right subclavian central venous line is stable. There is persistent cardiomegaly, central vascular congestion and mild interstitial thickening and hazy central airspace opacity consistent with pulmonary edema. No pneumothorax. IMPRESSION: 1. Cardiomegaly with mild persistent pulmonary edema similar to the previous exam. 2. New right internal jugular tunneled central venous line catheter tip projects in the right atrium. No pneumothorax. Electronically Signed   By: Lajean Manes M.D.   On: 01/09/2017 07:36   Dg Chest Port 1 View  Result Date: 01/04/2017 CLINICAL DATA:  Pulmonary edema. EXAM: PORTABLE CHEST 1 VIEW COMPARISON:  01/02/2017. FINDINGS: Left IJ line, right subclavian line in stable position. Stable cardiomegaly. Diffuse bilateral pulmonary infiltrates consistent with pulmonary edema again noted. Bilateral pneumonia cannot be excluded. Small left pleural effusion again noted. IMPRESSION: 1.  Central lines in stable position. 2. Persistent cardiomegaly with diffuse bilateral pulmonary infiltrates consistent pulmonary edema. Small left pleural effusion. These findings consistent CHF. Bilateral pneumonia cannot be excluded. No change from prior exam. Electronically Signed   By: Marcello Moores  Register   On: 01/04/2017 07:17   Dg  Chest Port 1 View  Result Date: 01/02/2017 CLINICAL DATA:  Patient with acute respiratory failure. EXAM: PORTABLE CHEST 1 VIEW COMPARISON:  Chest radiograph 12/30/2016 FINDINGS: Right subclavian central venous catheter tip projects over the superior vena cava. Left-sided approach dual-lumen central venous catheter tip projects against the suspected wall of the superior vena cava/ azygos vein. Monitoring leads overlie the patient. Stable cardiomegaly. Re- demonstrated diffuse bilateral interstitial pulmonary opacities. Retrocardiac consolidation. Probable small left pleural effusion. No pneumothorax. IMPRESSION: Cardiomegaly and associated pulmonary edema. Stable support apparatus. Electronically Signed   By: Lovey Newcomer M.D.   On: 01/02/2017 07:19   Dg Chest Port 1 View  Result Date: 12/30/2016 CLINICAL DATA:  Respiratory failure.  Hypoxia.  Tachycardia. EXAM: PORTABLE CHEST 1 VIEW COMPARISON:  One-view chest x-ray 12/29/2016 FINDINGS: Heart is enlarged. A right subclavian and left IJ catheter are stable. Edema and bilateral pleural effusions are not significantly changed. Bibasilar airspace disease is noted. IMPRESSION: 1. Stable findings of congestive heart failure. 2. Support apparatus is stable. Electronically Signed   By: San Morelle M.D.   On: 12/30/2016 08:50   Dg Chest Port 1 View  Result Date: 12/29/2016 CLINICAL DATA:  Encounter for central line placement. EXAM: PORTABLE CHEST 1 VIEW COMPARISON:  Radiograph of December 27, 2016. FINDINGS: Stable cardiomegaly. Bilateral perihilar interstitial densities are noted concerning for edema. Interval placement of left internal jugular dialysis catheter with distal tips in expected position of cavoatrial junction. Stable position of right subclavian catheter with distal tip in expected position of the SVC. No pneumothorax is noted. Possible mild bilateral pleural effusions are noted. Bony thorax is unremarkable. IMPRESSION: Stable cardiomegaly is  noted with probable bilateral perihilar edema and mild pleural effusions. Aortic atherosclerosis. Stable position of right subclavian catheter. Interval placement of left internal jugular dialysis catheter with distal tip in expected position of cavoatrial junction. Electronically Signed   By: Marijo Conception, M.D.   On: 12/29/2016 14:23   Dg Chest Port 1 View  Result Date: 12/27/2016 CLINICAL DATA:  Post central line placement. EXAM: PORTABLE CHEST 1 VIEW COMPARISON:  12/26/2016 FINDINGS: Right-sided central line terminates at the low SVC. Midline trachea. Cardiomegaly accentuated by AP portable technique. Atherosclerosis in the transverse aorta. Mildly degraded exam due to AP portable technique and patient body habitus. No pneumothorax. Suspect mild pulmonary venous congestion. Left lung base and pleural space not well evaluated. No right-sided pleural fluid or consolidation. IMPRESSION: Right-sided central line terminating at the low SVC ; no pneumothorax. Cardiomegaly with suggestion of mild pulmonary venous congestion. Aortic atherosclerosis. Electronically Signed   By: Abigail Miyamoto M.D.   On: 12/27/2016 16:39   Dg Chest Port 1 View  Result Date: 12/26/2016 CLINICAL DATA:  Dyspnea, respiratory distress. EXAM: PORTABLE CHEST 1 VIEW COMPARISON:  11/07/2016 FINDINGS: Patient rotated to the right. Lungs are adequately inflated demonstrate prominent perihilar markings suggesting mild vascular congestion. Stable hazy left base/ retrocardiac opacification. Moderate stable cardiomegaly. Calcified plaque over the aortic arch. Remainder of the exam is unchanged. IMPRESSION: Moderate stable cardiomegaly with suggestion of vascular congestion. Could not exclude effusions/atelectasis versus infection in the left base. Electronically Signed   By: Marin Olp M.D.   On: 12/26/2016 15:30   Dg Abd Portable 1v  Result Date: 01/22/2017 CLINICAL DATA:  Orogastric tube placement.  Initial encounter. EXAM: PORTABLE  ABDOMEN - 1 VIEW COMPARISON:  CT of the abdomen and pelvis performed 03/08/2016 FINDINGS: The patient's enteric tube is noted ending overlying the body of the stomach. The  visualized bowel gas pattern is unremarkable. Scattered air and stool filled loops of colon are seen; no abnormal dilatation of small bowel loops is seen to suggest small bowel obstruction. No free intra-abdominal air is identified, though evaluation for free air is limited on a single supine view. The visualized osseous structures are within normal limits; the sacroiliac joints are unremarkable in appearance. IMPRESSION: Enteric tube noted ending overlying the body of the stomach. Electronically Signed   By: Garald Balding M.D.   On: 01/22/2017 00:20   Mr Mrv Head Wo Cm  Result Date: 01/22/2017 CLINICAL DATA:  Extra-axial hemorrhage following recent cardioversion. EXAM: MR VENOGRAM the HEAD WITHOUT CONTRAST TECHNIQUE: Angiographic images of the intracranial venous structures were obtained using MRV technique without intravenous contrast. COMPARISON:  MRI brain 01/21/2017.  MRA head from the same date. FINDINGS: The superior sagittal sinus is patent. The straight sinus and deep cerebral veins are patent. The transverse and sigmoid sinuses are codominant and patent bilaterally. The cortical veins are unremarkable. The source images demonstrates a stable appearance of the extra-axial hemorrhages. IMPRESSION: Negative MR venogram. Electronically Signed   By: San Morelle M.D.   On: 01/22/2017 08:40    ASSESSMENT & PLAN:   61 year old patient with multiple medical comorbidities currently admitted with VRE bacteremia, CHF exacerbation, b/l Subdural hematomas with AMS with  1) New Thrombocytopenia from 01/21/2017. Peripheral blood smear shows no evidence of platelet clumping to suggest pseudothrombocytopenia. Thrombocytopenia is likely multifactorial.  Predominant factor seems to be DIC with elevated d-dimer, few schistocytes in  the peripheral blood and elevated PTT and PT.  Cannot rule out HIT syndrome. Intermediate probability T score with intermediate level HIT PF4 AB at 1.12.  No evidence of TTP/ HUS syndrome. Haptoglobin is within normal limits with no overt evidence of significant hemolysis. LDH is elevated likely due to infection and other issues.  Cannot rule out medication related thrombocytopenia and from the cytopenia due to sepsis in addition.  2) Elevated PTT and borderline elevated PTT (even off Heparin)  PLAN -DIC will need to be managed by treating the underlying trigger factors including bacteremia/sepsis. -Cannot rule out HIT syndrome. Intermediate probability T score with intermediate level HIT PF4 AB at 1.12. About 18-30% chance of the SRA being positive. -We will send out serotonin release assay to confirm/rule out diagnosis of hit syndrome. If confirmed to be present he is at heparin to the patient's allergy list. -Would avoid heparin use at this time from a perspective of possible hit as well as thrombocytopenia with subdural hematomas. -PTT mixing study to rule out factor inhibitor. -Transfuse platelets when necessary bleeding or if under 20,000 in the setting of coagulopathy. -Monitor daily CBC - other management as per ICU team  We'll follow along as needed if any new questions or concerns arise.  All of the patients questions were answered with apparent satisfaction. The patient knows to call the clinic with any problems, questions or concerns.  I spent 45 minutes counseling the patient face to face. The total time spent in the appointment was 60 minutes and more than 50% was on counseling and direct patient cares.    Sullivan Lone MD [place] AAHIVMS [facility] Hematology/Oncology Physician [facility]  (Office):       [phone] (Work cell):  [phone] (Fax):           [phone]  01/24/2017 2:55 PM

## 2017-01-24 NOTE — Progress Notes (Addendum)
Nutrition Consult/Follow Up  DOCUMENTATION CODES:   Morbid obesity  INTERVENTION:   Vital High Protein to goal rate of 50 ml/h (1200 ml per day) with Prostat 60 ml BID   TF regimen to provide 1600 kcals, 165 gm protein, 1008 ml free water daily  NUTRITION DIAGNOSIS:   Inadequate oral intake related to inability to eat as evidenced by NPO status, ongoing  GOAL:   Patient will meet greater than or equal to 90% of their needs, progressing   MONITOR:   TF tolerance, Vent status, Labs, Weight trends, I & O's  ASSESSMENT:   61 year old woman with poorly controlled OHS, chronic combined systolic and diastolic CHF, a-fib/a-flutter on chronic anticoagulation, hypertension, COPD, CKD Stage 3. Admitted to ED from [place] on 3/18 for  acute decompensated CHF complicated by cardiorenal syndrome. During stay developed acute on chronic kidney disease requiring CRRT, now being followed by nephrology for PRN HD. Blood culture positive for VRE bacteremia being treated with ampicillin and ceftriaxone. Transferred to step-down on 4/11. On 4/12 noted to be more lethargic and aphasic. CT Head revealed mixed density mass like area along the surface of the anterior frontal lobe with cytotoxic edema as well as a rounded 2 cm hemorrhage. Pt intubated 4/13.  Patient is currently intubated on ventilator support Temp (24hrs), Avg:98.7 F (37.1 C), Min:97 F (36.1 C), Max:99.4 F (37.4 C)  Developed altered mental status and aphasia 4/12.  Transferred from 3W-CPCU to 2H-Cardiovascular ICU. Vital High Protein started via Adult Tube Feeding Protocol.  Currently infusing at 40 ml/hr via OGT. Nephrology following for acute oliguric kidney injury.  On CRRT with citrate protocol.  Medications reviewed and include LEVOPHED and VERSED. Labs reviewed.  Sodium 134 (L).  Chloride 96 (L). CBG 133.  Diet Order:  Diet NPO time specified  Skin:  Reviewed, no issues  Last BM:  4/13  Height:   Ht Readings from  Last 1 Encounters:  01/21/17 5\' 8"  (1.727 m)   Weight:   Wt Readings from Last 1 Encounters:  01/24/17 288 lb 12.8 oz (131 kg)   Ideal Body Weight:  63.6 kg  BMI:  Body mass index is 43.91 kg/m.  Estimated Nutritional Needs:   Kcal:  8016-5537  Protein:  >/= 159 gm  Fluid:  per MD  EDUCATION NEEDS:   No education needs identified at this time  Arthur Holms, RD, LDN Pager #: [phone] After-Hours Pager #: [phone]

## 2017-01-24 NOTE — Progress Notes (Signed)
Rehab admissions - Noted decline in status with intubation since Friday.  Patient was recommended for SNF after rehab consult.  I will sign off for acute inpatient rehab at this point.  Call me for questions.  #[phone]

## 2017-01-25 ENCOUNTER — Inpatient Hospital Stay (HOSPITAL_COMMUNITY): Payer: Medicare Other

## 2017-01-25 LAB — POCT I-STAT, CHEM 8
BUN: 23 mg/dL — AB (ref 6–20)
BUN: 24 mg/dL — AB (ref 6–20)
BUN: 24 mg/dL — AB (ref 6–20)
BUN: 24 mg/dL — AB (ref 6–20)
BUN: 26 mg/dL — AB (ref 6–20)
BUN: 27 mg/dL — AB (ref 6–20)
BUN: 27 mg/dL — AB (ref 6–20)
BUN: 27 mg/dL — AB (ref 6–20)
BUN: 27 mg/dL — AB (ref 6–20)
BUN: 29 mg/dL — AB (ref 6–20)
BUN: 31 mg/dL — ABNORMAL HIGH (ref 6–20)
CALCIUM ION: 0.38 mmol/L — AB (ref 1.15–1.40)
CALCIUM ION: 0.88 mmol/L — AB (ref 1.15–1.40)
CALCIUM ION: 0.92 mmol/L — AB (ref 1.15–1.40)
CALCIUM ION: 0.93 mmol/L — AB (ref 1.15–1.40)
CALCIUM ION: 0.93 mmol/L — AB (ref 1.15–1.40)
CALCIUM ION: 0.95 mmol/L — AB (ref 1.15–1.40)
CHLORIDE: 86 mmol/L — AB (ref 101–111)
CHLORIDE: 87 mmol/L — AB (ref 101–111)
CHLORIDE: 87 mmol/L — AB (ref 101–111)
CHLORIDE: 88 mmol/L — AB (ref 101–111)
CHLORIDE: 91 mmol/L — AB (ref 101–111)
CREATININE: 1.9 mg/dL — AB (ref 0.44–1.00)
CREATININE: 2 mg/dL — AB (ref 0.44–1.00)
CREATININE: 2.1 mg/dL — AB (ref 0.44–1.00)
CREATININE: 2.2 mg/dL — AB (ref 0.44–1.00)
CREATININE: 2.2 mg/dL — AB (ref 0.44–1.00)
CREATININE: 2.3 mg/dL — AB (ref 0.44–1.00)
CREATININE: 2.5 mg/dL — AB (ref 0.44–1.00)
CREATININE: 2.5 mg/dL — AB (ref 0.44–1.00)
CREATININE: 2.8 mg/dL — AB (ref 0.44–1.00)
Calcium, Ion: 0.37 mmol/L — CL (ref 1.15–1.40)
Calcium, Ion: 0.38 mmol/L — CL (ref 1.15–1.40)
Calcium, Ion: 0.39 mmol/L — CL (ref 1.15–1.40)
Calcium, Ion: 0.4 mmol/L — CL (ref 1.15–1.40)
Calcium, Ion: 0.89 mmol/L — CL (ref 1.15–1.40)
Chloride: 89 mmol/L — ABNORMAL LOW (ref 101–111)
Chloride: 87 mmol/L — ABNORMAL LOW (ref 101–111)
Chloride: 86 mmol/L — ABNORMAL LOW (ref 101–111)
Chloride: 88 mmol/L — ABNORMAL LOW (ref 101–111)
Chloride: 83 mmol/L — ABNORMAL LOW (ref 101–111)
Chloride: 84 mmol/L — ABNORMAL LOW (ref 101–111)
Creatinine, Ser: 2.6 mg/dL — ABNORMAL HIGH (ref 0.44–1.00)
Creatinine, Ser: 2.3 mg/dL — ABNORMAL HIGH (ref 0.44–1.00)
GLUCOSE: 147 mg/dL — AB (ref 65–99)
GLUCOSE: 142 mg/dL — AB (ref 65–99)
GLUCOSE: 149 mg/dL — AB (ref 65–99)
GLUCOSE: 196 mg/dL — AB (ref 65–99)
GLUCOSE: 145 mg/dL — AB (ref 65–99)
GLUCOSE: 194 mg/dL — AB (ref 65–99)
Glucose, Bld: 147 mg/dL — ABNORMAL HIGH (ref 65–99)
Glucose, Bld: 149 mg/dL — ABNORMAL HIGH (ref 65–99)
Glucose, Bld: 192 mg/dL — ABNORMAL HIGH (ref 65–99)
Glucose, Bld: 194 mg/dL — ABNORMAL HIGH (ref 65–99)
Glucose, Bld: 198 mg/dL — ABNORMAL HIGH (ref 65–99)
HCT: 30 % — ABNORMAL LOW (ref 36.0–46.0)
HCT: 31 % — ABNORMAL LOW (ref 36.0–46.0)
HCT: 32 % — ABNORMAL LOW (ref 36.0–46.0)
HCT: 32 % — ABNORMAL LOW (ref 36.0–46.0)
HCT: 33 % — ABNORMAL LOW (ref 36.0–46.0)
HCT: 34 % — ABNORMAL LOW (ref 36.0–46.0)
HCT: 34 % — ABNORMAL LOW (ref 36.0–46.0)
HCT: 35 % — ABNORMAL LOW (ref 36.0–46.0)
HCT: 35 % — ABNORMAL LOW (ref 36.0–46.0)
HEMATOCRIT: 31 % — AB (ref 36.0–46.0)
HEMATOCRIT: 31 % — AB (ref 36.0–46.0)
HEMOGLOBIN: 10.9 g/dL — AB (ref 12.0–15.0)
HEMOGLOBIN: 11.9 g/dL — AB (ref 12.0–15.0)
Hemoglobin: 10.2 g/dL — ABNORMAL LOW (ref 12.0–15.0)
Hemoglobin: 10.5 g/dL — ABNORMAL LOW (ref 12.0–15.0)
Hemoglobin: 10.5 g/dL — ABNORMAL LOW (ref 12.0–15.0)
Hemoglobin: 10.5 g/dL — ABNORMAL LOW (ref 12.0–15.0)
Hemoglobin: 10.9 g/dL — ABNORMAL LOW (ref 12.0–15.0)
Hemoglobin: 11.2 g/dL — ABNORMAL LOW (ref 12.0–15.0)
Hemoglobin: 11.6 g/dL — ABNORMAL LOW (ref 12.0–15.0)
Hemoglobin: 11.6 g/dL — ABNORMAL LOW (ref 12.0–15.0)
Hemoglobin: 11.9 g/dL — ABNORMAL LOW (ref 12.0–15.0)
POTASSIUM: 3.3 mmol/L — AB (ref 3.5–5.1)
POTASSIUM: 3.4 mmol/L — AB (ref 3.5–5.1)
Potassium: 3 mmol/L — ABNORMAL LOW (ref 3.5–5.1)
Potassium: 3 mmol/L — ABNORMAL LOW (ref 3.5–5.1)
Potassium: 3.2 mmol/L — ABNORMAL LOW (ref 3.5–5.1)
Potassium: 3.2 mmol/L — ABNORMAL LOW (ref 3.5–5.1)
Potassium: 3.3 mmol/L — ABNORMAL LOW (ref 3.5–5.1)
Potassium: 3.4 mmol/L — ABNORMAL LOW (ref 3.5–5.1)
Potassium: 3.4 mmol/L — ABNORMAL LOW (ref 3.5–5.1)
Potassium: 3.6 mmol/L (ref 3.5–5.1)
Potassium: 3.6 mmol/L (ref 3.5–5.1)
SODIUM: 135 mmol/L (ref 135–145)
SODIUM: 135 mmol/L (ref 135–145)
SODIUM: 136 mmol/L (ref 135–145)
SODIUM: 137 mmol/L (ref 135–145)
Sodium: 136 mmol/L (ref 135–145)
Sodium: 137 mmol/L (ref 135–145)
Sodium: 135 mmol/L (ref 135–145)
Sodium: 136 mmol/L (ref 135–145)
Sodium: 135 mmol/L (ref 135–145)
Sodium: 136 mmol/L (ref 135–145)
Sodium: 137 mmol/L (ref 135–145)
TCO2: 30 mmol/L (ref 0–100)
TCO2: 31 mmol/L (ref 0–100)
TCO2: 32 mmol/L (ref 0–100)
TCO2: 32 mmol/L (ref 0–100)
TCO2: 33 mmol/L (ref 0–100)
TCO2: 33 mmol/L (ref 0–100)
TCO2: 33 mmol/L (ref 0–100)
TCO2: 34 mmol/L (ref 0–100)
TCO2: 35 mmol/L (ref 0–100)
TCO2: 35 mmol/L (ref 0–100)
TCO2: 36 mmol/L (ref 0–100)

## 2017-01-25 LAB — CBC WITH DIFFERENTIAL/PLATELET
Basophils Absolute: 0.1 10*3/uL (ref 0.0–0.1)
Basophils Relative: 1 %
EOS PCT: 2 %
Eosinophils Absolute: 0.2 10*3/uL (ref 0.0–0.7)
HEMATOCRIT: 30.6 % — AB (ref 36.0–46.0)
HEMOGLOBIN: 9.4 g/dL — AB (ref 12.0–15.0)
LYMPHS PCT: 10 %
Lymphs Abs: 0.8 10*3/uL (ref 0.7–4.0)
MCH: 27.2 pg (ref 26.0–34.0)
MCHC: 30.7 g/dL (ref 30.0–36.0)
MCV: 88.7 fL (ref 78.0–100.0)
MONOS PCT: 11 %
Monocytes Absolute: 0.9 10*3/uL (ref 0.1–1.0)
NEUTROS PCT: 76 %
Neutro Abs: 5.9 10*3/uL (ref 1.7–7.7)
Platelets: 44 10*3/uL — ABNORMAL LOW (ref 150–400)
RBC: 3.45 MIL/uL — AB (ref 3.87–5.11)
RDW: 27.9 % — ABNORMAL HIGH (ref 11.5–15.5)
WBC MORPHOLOGY: INCREASED
WBC: 7.9 10*3/uL (ref 4.0–10.5)

## 2017-01-25 LAB — RENAL FUNCTION PANEL
ALBUMIN: 2 g/dL — AB (ref 3.5–5.0)
ANION GAP: 18 — AB (ref 5–15)
ANION GAP: 19 — AB (ref 5–15)
Albumin: 2 g/dL — ABNORMAL LOW (ref 3.5–5.0)
BUN: 25 mg/dL — AB (ref 6–20)
BUN: 27 mg/dL — ABNORMAL HIGH (ref 6–20)
CALCIUM: 9.2 mg/dL (ref 8.9–10.3)
CHLORIDE: 89 mmol/L — AB (ref 101–111)
CO2: 31 mmol/L (ref 22–32)
CO2: 34 mmol/L — ABNORMAL HIGH (ref 22–32)
CREATININE: 2.45 mg/dL — AB (ref 0.44–1.00)
Calcium: 8.7 mg/dL — ABNORMAL LOW (ref 8.9–10.3)
Chloride: 87 mmol/L — ABNORMAL LOW (ref 101–111)
Creatinine, Ser: 2.7 mg/dL — ABNORMAL HIGH (ref 0.44–1.00)
GFR calc Af Amer: 21 mL/min — ABNORMAL LOW (ref >60)
GFR calc non Af Amer: 21 mL/min — ABNORMAL LOW (ref >60)
GFR, EST AFRICAN AMERICAN: 24 mL/min — AB (ref >60)
GFR, EST NON AFRICAN AMERICAN: 18 mL/min — AB (ref >60)
Glucose, Bld: 146 mg/dL — ABNORMAL HIGH (ref 65–99)
Glucose, Bld: 137 mg/dL — ABNORMAL HIGH (ref 65–99)
PHOSPHORUS: 1.8 mg/dL — AB (ref 2.5–4.6)
POTASSIUM: 3.5 mmol/L (ref 3.5–5.1)
Phosphorus: 1.8 mg/dL — ABNORMAL LOW (ref 2.5–4.6)
Potassium: 3.2 mmol/L — ABNORMAL LOW (ref 3.5–5.1)
SODIUM: 140 mmol/L (ref 135–145)
Sodium: 138 mmol/L (ref 135–145)

## 2017-01-25 LAB — GLUCOSE, CAPILLARY
GLUCOSE-CAPILLARY: 140 mg/dL — AB (ref 65–99)
Glucose-Capillary: 131 mg/dL — ABNORMAL HIGH (ref 65–99)
Glucose-Capillary: 135 mg/dL — ABNORMAL HIGH (ref 65–99)
Glucose-Capillary: 138 mg/dL — ABNORMAL HIGH (ref 65–99)
Glucose-Capillary: 140 mg/dL — ABNORMAL HIGH (ref 65–99)
Glucose-Capillary: 144 mg/dL — ABNORMAL HIGH (ref 65–99)

## 2017-01-25 LAB — PATHOLOGIST SMEAR REVIEW

## 2017-01-25 LAB — MAGNESIUM
MAGNESIUM: 1.8 mg/dL (ref 1.7–2.4)
Magnesium: 1.7 mg/dL (ref 1.7–2.4)

## 2017-01-25 LAB — PHOSPHORUS: PHOSPHORUS: 1.8 mg/dL — AB (ref 2.5–4.6)

## 2017-01-25 LAB — CALCIUM, IONIZED: CALCIUM, IONIZED, SERUM: 4.4 mg/dL — AB (ref 4.5–5.6)

## 2017-01-25 LAB — HEPARIN INDUCED PLATELET AB (HIT ANTIBODY): Heparin Induced Plt Ab: 0.834 OD — ABNORMAL HIGH (ref 0.000–0.400)

## 2017-01-25 NOTE — Progress Notes (Signed)
STROKE TEAM PROGRESS NOTE   SUBJECTIVE (INTERVAL HISTORY) No one is  at the bedside.   Pt still intubated, on sedation more responsive.     OBJECTIVE Temp:  [98.5 F (36.9 C)-99.4 F (37.4 C)] 99.4 F (37.4 C) (04/17 1115) Pulse Rate:  [25-134] 79 (04/17 1200) Cardiac Rhythm: Atrial fibrillation;Bundle branch block (04/17 1200) Resp:  [13-25] 16 (04/17 1200) BP: (63-135)/(37-106) 98/63 (04/17 1200) SpO2:  [98 %-100 %] 100 % (04/17 1200) Arterial Line BP: (109-147)/(43-58) 128/54 (04/17 1200) FiO2 (%):  [40 %] 40 % (04/17 1200) Weight:  [279 lb 15.8 oz (127 kg)] 279 lb 15.8 oz (127 kg) (04/17 0500)  CBC:   Recent Labs Lab 01/24/17 0347  01/25/17 0413  01/25/17 1002 01/25/17 1008  WBC 6.7  --  7.9  --   --   --   NEUTROABS  --   --  5.9  --   --   --   HGB 9.3*  < > 9.4*  < > 10.5* 11.9*  HCT 29.8*  < > 30.6*  < > 31.0* 35.0*  MCV 88.7  --  88.7  --   --   --   PLT 39*  --  44*  --   --   --   < > = values in this interval not displayed.  Basic Metabolic Panel:   Recent Labs Lab 01/24/17 1502  01/25/17 0413  01/25/17 1002 01/25/17 1008  NA 138  < > 138  < > 135 136  K 4.2  < > 3.5  < > 3.2* 3.2*  CL 97*  < > 89*  < > 87* 86*  CO2 25  --  31  --   --   --   GLUCOSE 131*  < > 146*  < > 149* 198*  BUN 24*  < > 25*  < > 27* 24*  CREATININE 3.17*  < > 2.70*  < > 2.30* 2.10*  CALCIUM 8.9  --  8.7*  --   --   --   MG 2.1  --  1.8  --   --   --   PHOS 2.3*  2.3*  --  1.8*  --   --   --   < > = values in this interval not displayed.  Lipid Panel:     Component Value Date/Time   CHOL 167 01/15/2015 0346   TRIG 194 (H) 01/15/2015 0346   HDL 35 (L) 01/15/2015 0346   CHOLHDL 4.8 01/15/2015 0346   VLDL 39 01/15/2015 0346   LDLCALC 93 01/15/2015 0346   HgbA1c:  Lab Results  Component Value Date   HGBA1C  10/02/2009    5.6 (NOTE) The ADA recommends the following therapeutic goal for glycemic control related to Hgb A1c measurement: Goal of therapy: <6.5 Hgb A1c   Reference: American Diabetes Association: Clinical Practice Recommendations 2010, Diabetes Care, 2010, 33: (Suppl  1).   Urine Drug Screen:     Component Value Date/Time   LABOPIA NONE DETECTED 11/15/2009 2107   COCAINSCRNUR NONE DETECTED 11/15/2009 2107   LABBENZ NONE DETECTED 11/15/2009 2107   AMPHETMU NONE DETECTED 11/15/2009 2107   THCU NONE DETECTED 11/15/2009 2107   LABBARB  11/15/2009 2107    NONE DETECTED        DRUG SCREEN FOR MEDICAL PURPOSES ONLY.  IF CONFIRMATION IS NEEDED FOR ANY PURPOSE, NOTIFY LAB WITHIN 5 DAYS.        LOWEST DETECTABLE LIMITS FOR URINE DRUG SCREEN  Drug Class       Cutoff (ng/mL) Amphetamine      1000 Barbiturate      200 Benzodiazepine   619 Tricyclics       509 Opiates          300 Cocaine          300 THC              50    Alcohol Level No results found for: [place] I have personally reviewed the radiological images below and agree with the radiology interpretations.  Ct Head Wo Contrast 01/21/2017 Stable LEFT frontal mixed attenuation collections, as described above. 2 mm LEFT-to-RIGHT shift. I believe the collections could both be extra-axial, representing subdural/epidural hematoma/hygroma. Continued surveillance warranted. Patient could not cooperate for MRI. If clinically indicated, repeat could be performed with sedation.   Mr Jodene Nam Head Wo Contrast 01/22/2017 1. 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  2. Otherwise normal MRA circle of Willis without significant proximal stenosis or occlusion.  No other aneurysms are present.   Mr Brain Wo Contrast 01/21/2017 1. Slightly incomplete noncontrast brain MRI.  2. Left-sided subdural hematoma, unchanged from earlier CT. Trace rightward midline shift.  3. Trace right frontal subdural hematoma.  4. No acute infarct.   Mr Mrv Head Wo Cm 01/22/2017 Negative MR venogram.   Dg Chest Port 1 View 01/22/2017 1. Increase in LEFT basilar atelectasis.  2. Central venous  pulmonary congestion similar prior.  3. Stable support apparatus.   Dg Chest Port 1 View 01/23/2017 1. Slight increase and mild edema. 2. Stable retrocardiac opacification likely reflecting a combination of atelectasis and effusion. Infection is not excluded. 3. The support apparatus is stable.   TTE 01/11/2017 - Left ventricle: The cavity size was mildly dilated. Wall   thickness was increased in a pattern of mild LVH. Left   ventricular geometry showed evidence of eccentric hypertrophy.   Systolic function was moderately reduced. The estimated ejection   fraction was in the range of 35% to 40%. Moderate diffuse   hypokinesis with no identifiable regional variations. Acoustic   contrast opacification revealed no evidence ofthrombus. - Ventricular septum: The contour showed diastolic flattening.   These changes are consistent with RV volume overload. - Aortic valve: There was mild to moderate regurgitation directed   centrally in the LVOT. - Mitral valve: There was mild to moderate regurgitation directed   eccentrically and posteriorly. - Left atrium: The atrium was moderately dilated. - Right ventricle: The cavity size was severely dilated. Wall   thickness was normal. Systolic function was mildly reduced. - Right atrium: The atrium was severely dilated. - Tricuspid valve: There was malcoaptation of the valve leaflets.   There was moderate-severe regurgitation. - Pulmonary arteries: Systolic pressure was severely increased. PA   peak pressure: 71 mm Hg (S). - Pericardium, extracardiac: A trivial pericardial effusion was   identified posterior to the heart.  EEG  01/22/2017 This is an abnormal EEG due to severe generalized suppressed and slowing of brain activity and characteristic waveforms consistent with a metabolic encephalopathy.  The patient is not in non-convulsive status epilepticus.     PHYSICAL EXAM  Temp:  [98.5 F (36.9 C)-99.4 F (37.4 C)] 99.4 F (37.4 C)  (04/17 1115) Pulse Rate:  [25-134] 79 (04/17 1200) Resp:  [13-25] 16 (04/17 1200) BP: (63-135)/(37-106) 98/63 (04/17 1200) SpO2:  [98 %-100 %] 100 % (04/17 1200) Arterial Line BP: (109-147)/(43-58) 128/54 (04/17 1200) FiO2 (%):  [  40 %] 40 % (04/17 1200) Weight:  [279 lb 15.8 oz (127 kg)] 279 lb 15.8 oz (127 kg) (04/17 0500)  General - obese, well developed, intubated on sedation.  Ophthalmologic - Fundi not visualized  Cardiovascular - Regular rate and rhythm.  Neuro - intubated on sedation,   eyes are open but aphasic  ,   following few midline and left body commands. Does respond to stimulation by opening eyes but not blinking to visual threat, not tracking. PERRL, doll's eye present, positive corneal and gag. Resume over the vent. On pain stimulation no movement on both upper extremities, however mild withdrawal on bilateral lower extremities. DTR diminished and no Babinski. Sensation, coordination, and gait not tested.    ASSESSMENT/PLAN Paige Marshall is a 61 y.o. female with history ofA. fib on Coumadin, stroke, CHF, CK D, HTN, HLD, obesity, cardiomyopathy presenting with Pulmonary edema, respiratory distress and fluid overload. Received cardioversion, however postprocedure found to have AMS and aphasia. Was on Coumadin with heparin IV bridge, INR 1.74. CT found to have left frontal ICH and parafalcine SDH.   ICH and SDH:  Left frontal small ICH with parafalcine small SDH, etiology likely from trauma and warfarin related subdural hematoma acute and chronic components.. . Pt had fall in 09/2016.  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   MRV - negative  2D Echo - EF 35-40% - no cardiac source of emboli identified.  EEG - severe generalized suppressed and slowing of brain activity and characteristic waveforms consistent with a metabolic  encephalopathy.    SCDs for VTE prophylaxis Diet NPO time specified  heparin IV and warfarin daily discontinued and INR reversed, now on No antithrombotic  Ongoing aggressive stroke risk factor management  Therapy recommendations:  Pending   Disposition:  Pending  Atrial fibrillation   Was on coumadin with heparin bridge  INR 1.74, reversed with VitK and kcentra  Hold off anticoagulation for now  May consider resume anticoagulation once blood absorbed and pt stabilized  On amiodarone IV  AMS  The degree of AMS cannot be explained by CT/MRI findings  Multifactorial, likely due to worsening renal function, bacteremia, sepsis and CHF  Nephrology and ID on board  On ampicillin, Rocephin  EEG - abnormal EEG due to severe generalized suppressed and slowing of brain activity and characteristic waveforms consistent with a metabolic encephalopathy.   Ammonia level slightly elevated at 40  Hypotension on Midrin, Levophed, and Neo BP goal < 140 due to SDH  AKI on CKD  Cre worsening - 7.45 -> 6.77 -> 5.19  Nephrology on board  On CRRT today  VRE bacteremia / sepsis  Blood Cx showed VRE enterococcus  ID on board  On ampicillin and rocephine  BP low on pressors  Other Active Problems  CHF  Cardiomyopathy EF 35-40%  Obesity  OSA  Anemia - stable - 10 / 31.7  Thrombocytopenia - 75 K  Na - 133 -> 132 -> 134  Hospital day # 30 .She is showing some improvement but will likely need prolonged ventilatory support, tracheostomy and PEG tube and nursing home placement. Await arrival of patient's children to make final decision on level of care.D/W Dr Halford Chessman. Stroke team will sign off. Call for questions. This patient is critically ill due to left frontal and parafalcine SDH/hygroma, AMS, afib not on AC and at significant risk of  neurological worsening, death form hematoma expansion, brain herniation, stroke, heart failure, sepsis, hypotension. This patient's care  requires constant monitoring of vital signs, hemodynamics, respiratory and cardiac monitoring, review of multiple databases, neurological assessment, discussion with family, other specialists and medical decision making of high complexity. I spent 30 minutes of neurocritical care time in the care of this patient.   Antony Contras, MD  [facility] [address] [place], [place] [address]  Phone [phone] Fax [phone]  To contact Stroke Continuity provider, please refer to [email or web address] After hours, contact General Neurology

## 2017-01-25 NOTE — Procedures (Signed)
Patient seen and examined on crrt QB 200, UF goal 50 ml? Hr. Changing to keeping even. Treatment adjusted as needed.  Jered Heiny 1:32 PM

## 2017-01-25 NOTE — Progress Notes (Signed)
Pharmacy Heparin Induced Thrombocytopenia (HIT) Note:  Paige Marshall is a 61 y.o. female being evaluated for HIT. Heparin was started 12/30/15 for afib, and baseline platelets were ~ 200 (although there was a lot of variability at this time). HIT labs were ordered on 4/16 when platelets dropped to 39. Course is complicated by a long course of heparin (discontinued on 4/12). MRI on 4/13 also showed ICH/SDH. Possible DIC noted. SRA ordered 4/16 -heparin antibody = 1.12, 4Ts ~ 3  Heparin Induced Plt Ab  Date/Time Value Ref Range Status  01/23/2017 08:21 AM 1.120 (H) 0.000 - 0.400 OD Final    Comment:    (NOTE) Performed At: [facility] [place], [place] [id number] Lindon Romp MD [id number]      Score = 0 Score = 1 Score = 2 Calculated Score  Thrombocytopenia -Platelet fall < 30% -Any platelet fall with nadir < 10 -Platelet fall >50% BUT surgery within 3 days  -Any combination of platelet fall/nadir that does not fit score 0 or 2 -Platelet fall >50% AND nadir ?20 AND no surgery within 3 days     2  Timing -Platelets fall on days ? 4  AND no prior heparin exposure in previous 100 days -Consistent w/ platelet fall on days 5-10 but missing counts -Platelet fall within 1 day of start AND prior exposure to heparin within previous 31-100 days -Platelet fall after day 10 -Platelet fall 5-10 days after starting heparin -Platelet fall within 1 day of heparin start AND prior exposure within previous 5-30 days 1  Thrombosis -None suspected -Recurrent thrombosis in patient receiving therapeutic anticoagulants -Suspected thrombosis (awaiting confirmation via imaging) -Erythematous skin lesions at heparin injection sites -Confirmed new thrombosis -Skin necrosis at injection site -Anaphylactoid reaction to heparin IV bolus -Adrenal hemorrhage 0  oTher Causes -Probable other cause (see protocol for full list) -Possible other cause evident (see protocol for full list) -No  alternative explanation 0  Total Calculated 4T Score:      3     Algorithm Recommendation: Low probability for HIT. No anticoagulation due to ICH/SDH. No allergy documentation needed  Plan: -Will follow results of [place], Pharm D 01/25/2017 11:43 AM

## 2017-01-25 NOTE — Progress Notes (Signed)
PULMONARY / CRITICAL CARE MEDICINE   Name: Paige Marshall MRN: [id number] DOB: 12/04/1955    ADMISSION DATE:  12/26/2016 CONSULTATION DATE:  01/20/2017  REFERRING MD:  Dr. Evette Doffing   CHIEF COMPLAINT:  AMS  HPI: 61 yo female from rehab with dyspnea 2nd to CHF exacerbation.  She required CRRT in setting of acute on chronic kidney disease.  Found to have VRE bacteremia.  Developed altered mental status 4/12 and aphasia.  CT head showed mixed density mass like area along the surface of the anterior frontal lobe with cytotoxic edema as well as a rounded 2 cm hemorrhage.  She was transferred to back to ICU.  SUBJECTIVE:  Pulling 50 ml/hr with CRRT.  Remains on vent.  VITAL SIGNS: BP (!) 63/51 (BP Location: Right Arm)   Pulse 80   Temp 99.2 F (37.3 C) (Oral)   Resp 14   Ht 5\' 8"  (1.727 m)   Wt 279 lb 15.8 oz (127 kg)   LMP 09/07/2011   SpO2 100%   BMI 42.57 kg/m   HEMODYNAMICS: CVP:  [8 mmHg-14 mmHg] 14 mmHg  VENTILATOR SETTINGS: Vent Mode: PRVC FiO2 (%):  [40 %] 40 % Set Rate:  [15 bmp] 15 bmp Vt Set:  [540 mL] 540 mL PEEP:  [5 cmH20] 5 cmH20 Pressure Support:  [5 cmH20] 5 cmH20 Plateau Pressure:  [17 cmH20-19 cmH20] 19 cmH20  INTAKE / OUTPUT: I/O last 3 completed shifts: In: 5326.1 [I.V.:3518.1; NG/GT:1358; IV Piggyback:450] Out: 1287 [Other:6867]  PHYSICAL EXAMINATION: General: more aler Neuro: following some simple commands Eyes: pupils reactive ENT: ETT in place Cardiac: regular Chest: b/l rhonchi Abd: soft, non tender Ext: Rt foot drop Skin: no rashes  LABS: CMP Latest Ref Rng & Units 01/25/2017 01/25/2017 01/25/2017  Glucose 65 - 99 mg/dL 142(H) 194(H) 147(H)  BUN 6 - 20 mg/dL 26(H) 24(H) 27(H)  Creatinine 0.44 - 1.00 mg/dL 2.60(H) 2.20(H) 2.50(H)  Sodium 135 - 145 mmol/L 136 137 136  Potassium 3.5 - 5.1 mmol/L 3.4(L) 3.3(L) 3.4(L)  Chloride 101 - 111 mmol/L 87(L) 87(L) 89(L)  CO2 22 - 32 mmol/L - - -  Calcium 8.9 - 10.3 mg/dL - - -  Total Protein  6.5 - 8.1 g/dL - - -  Total Bilirubin 0.3 - 1.2 mg/dL - - -  Alkaline Phos 38 - 126 U/L - - -  AST 15 - 41 U/L - - -  ALT 14 - 54 U/L - - -    CBC Latest Ref Rng & Units 01/25/2017 01/25/2017 01/25/2017  WBC 4.0 - 10.5 K/uL - - -  Hemoglobin 12.0 - 15.0 g/dL 10.5(L) 11.6(L) 10.9(L)  Hematocrit 36.0 - 46.0 % 31.0(L) 34.0(L) 32.0(L)  Platelets 150 - 400 K/uL - - -    ABG    Component Value Date/Time   PHART 7.340 (L) 01/24/2017 0440   PCO2ART 41.4 01/24/2017 0440   PO2ART 75.6 (L) 01/24/2017 0440   HCO3 21.7 01/24/2017 0440   TCO2 33 01/25/2017 0639   ACIDBASEDEF 3.1 (H) 01/24/2017 0440   O2SAT 94.6 01/24/2017 0440    Dg Chest Port 1 View  Result Date: 01/24/2017 CLINICAL DATA:  Intubated patient, acute respiratory failure. EXAM: PORTABLE CHEST 1 VIEW COMPARISON:  Portable chest x-ray of January 23, 2017 FINDINGS: The cardiac silhouette remains enlarged. The retrocardiac region remains dense with obscuration of the left hemidiaphragm. The interstitial markings remain increased in the pulmonary vascularity engorged. There is calcification in the wall of the aortic arch. The endotracheal tube tip  lies 2.7 cm above the carina. The esophagogastric tube tip projects below the inferior margin of the image. The dual-lumen dialysis type catheter tip projects over the midportion of the SVC. The left internal jugular venous catheter tip projects over the proximal SVC. IMPRESSION: CHF with pulmonary interstitial edema. Left lower lobe atelectasis or pneumonia. The support tubes are in reasonable position. There has not been significant interval change since yesterday's study. The support tubes are in reasonable position. Thoracic aortic atherosclerosis. Electronically Signed   By: David  Martinique M.D.   On: 01/24/2017 07:22     STUDIES: CT Head 4/11 > mixed density mass like area along the surface of the anterior frontal lobe, with cytotoxic type edema as well as a rounded 2 cm area of hemorrhage CT  Head 4/13 > Left frontal mixed attenuation collection, hyperdense acute hemorrhage posteriorly, max thickness between 13-15 mm, acute left parafalcine subdural hemorrhage, 2 mm left to right shift  MRI Brain 4/13 >> left subdural hematoma with slight shift EEG 4/14 >> generalized slowing  CULTURES: Blood 4/01 > E faecalis, vancomycin resistant  ANTIBIOTICS: Vancomycin 4/01 > 4/03 Daptomycin 4/03 > 4/05 Ampicillin 4/05 > Rocephin 4/05 >  SIGNIFICANT EVENTS: 3/18 Admit 3/21 start CRRT 3/27 DC cardioversion 3/29 To SDU 3/31 Fever 4/06 Failed repeat DC cardioversion 4/08 Transition off CRRT 4/12 New ICH  4/16 Progressive thrombocytopenia >> hematology consulted  LINES/TUBES: Rt IJ HD cath 3/31 >> Lt IJ CVL 4/02 > 4/13 Lt IJ CVL 4/13 >> ETT 4/13 >> Rt fem A line 4/14 >>  DISCUSSION: 61 yo female admitted with CHF exacerbation complicated by renal failure and VRE bacteremia, on 4/12 found to have new ICH after cardioversion.  Has persistent mental status change, but seems unlikely that brain imaging findings significant enough to explain changes.  Has progressive thrombocytopenia likely from DIC.  ASSESSMENT / PLAN:  Acute encephalopathy. Lt frontal ICH with SDH. Rt foot drop. - RASS goal 0 to -1 - monitor mental status >> improved some 4/17 - f/u with neurology - foot drop boot  Acute on chronic hypoxic/hypercapnic respiratory failure. Hx of COPD, OSA, OHS. - pressure support wean >> not ready for extubation trial yet - f/u CXR  Acute on chronic combined CHF. A fib s/p cardioversion 4/12. Cardiogenic shock. - pressors to keep MAP > 65 - amiodarone per cardiology  Acute renal failure. - CRRT per renal >> negative fluid balance as tolerated  Thrombocytopenia likely from DIC ?HIT >> heparin induced Ab elevated 01/23/17. - appreciate input from hematology - last dose of coumadin 4/11, and heparin 4/12 - f/u serotonin release assay, PTT factor inhibitor, repeat  heparin induced Ab from 4/16  VRE bacteremia. - continue Abx to 4/18 per ID  DVT prophylaxis - SCDs SUP - Protonix Nutrition - tube feeds Goals of care - full code  CC time 32 minutes  Chesley Mires, MD [place] 01/25/2017, 8:24 AM Pager:  [phone] After 3pm call: [phone]

## 2017-01-25 NOTE — Progress Notes (Signed)
[facility] Progress Note    Assessment/ Plan:   1. Acute oliguric kidney injury: remains anuric.   We will continue CRRT with citrate protocol.  Keeping even given no edema and pressor requirement.   2. Afib with RVR: on amio gtt at present 3.  Shock 2/2 VRE bacteremia: remains on pressor, on antibiotics (end date 4/18) 4.  Acute on chronic hypoxic respiratory failure: remains intubated 5.  SDH: awake today, more interactive.  Subjective:    Still requiring pressor.  Following commands intermittently today.   Objective:   BP 98/63 (BP Location: Right Arm)   Pulse 79   Temp 99.4 F (37.4 C) (Oral)   Resp 16   Ht 5' 8"  (1.727 m)   Wt 127 kg (279 lb 15.8 oz)   LMP 09/07/2011   SpO2 100%   BMI 42.57 kg/m   Intake/Output Summary (Last 24 hours) at 01/25/17 1329 Last data filed at 01/25/17 1300  Gross per 24 hour  Intake          4278.55 ml  Output             5625 ml  Net         -1346.45 ml   Weight change: -4 kg (-8 lb 13.1 oz)  Physical Exam: ENI:DPOE open, looking around CVS: tachy, irregular Resp:coarse bilaterally Abd: obese Ext: no overt edema ACCESS: R TDC in place, dressing c/d/intact.  Imaging: Dg Chest Port 1 View  Result Date: 01/24/2017 CLINICAL DATA:  Intubated patient, acute respiratory failure. EXAM: PORTABLE CHEST 1 VIEW COMPARISON:  Portable chest x-ray of January 23, 2017 FINDINGS: The cardiac silhouette remains enlarged. The retrocardiac region remains dense with obscuration of the left hemidiaphragm. The interstitial markings remain increased in the pulmonary vascularity engorged. There is calcification in the wall of the aortic arch. The endotracheal tube tip lies 2.7 cm above the carina. The esophagogastric tube tip projects below the inferior margin of the image. The dual-lumen dialysis type catheter tip projects over the midportion of the SVC. The left internal jugular venous catheter tip projects over the proximal SVC. IMPRESSION:  CHF with pulmonary interstitial edema. Left lower lobe atelectasis or pneumonia. The support tubes are in reasonable position. There has not been significant interval change since yesterday's study. The support tubes are in reasonable position. Thoracic aortic atherosclerosis. Electronically Signed   By: David  Martinique M.D.   On: 01/24/2017 07:22   Dg Abd Portable 1v  Result Date: 01/25/2017 CLINICAL DATA:  61 year old female feeding tube placement. Initial encounter. EXAM: PORTABLE ABDOMEN - 1 VIEW COMPARISON:  01/22/2017 and earlier. FINDINGS: Portable AP supine view at 1050 hours. Enteric tube courses through the stomach and into the duodenum with tip at the level of the ligament of Treitz. Negative visible bowel gas pattern. Partially visible increased left lung base opacity. No acute osseous abnormality identified. IMPRESSION: 1. Enteric tube placed, tip at the ligament of Treitz level. 2. Negative visible bowel gas pattern. Electronically Signed   By: Genevie Ann M.D.   On: 01/25/2017 11:11    Labs: BMET  Recent Labs Lab 01/22/17 0412 01/22/17 1546 01/23/17 0404 01/23/17 1700 01/24/17 0347  01/24/17 1016  01/24/17 1502  01/25/17 0413 01/25/17 0423 01/25/17 0428 01/25/17 4235 01/25/17 0759 01/25/17 0803 01/25/17 1002 01/25/17 1008  NA 133* 132* 134* 136 137  < >  --   < > 138  < > 138 136 137 136 135 136 135 136  K 4.4 4.3 4.3 4.3  4.2  < >  --   < > 4.2  < > 3.5 3.4* 3.3* 3.4* 3.6 3.3* 3.2* 3.2*  CL 97* 97* 99* 98* 101  < >  --   < > 97*  < > 89* 89* 87* 87* 88* 86* 87* 86*  CO2 18* 19* 20* 21* 22  --   --   --  25  --  31  --   --   --   --   --   --   --   GLUCOSE 112* 94 103* 94 108*  < >  --   < > 131*  < > 146* 147* 194* 142* 149* 196* 149* 198*  BUN 37* 36* 31* 25* 24*  < >  --   < > 24*  < > 25* 27* 24* 26* 31* 24* 27* 24*  CREATININE 7.45* 6.77* 5.19* 4.26* 3.63*  < >  --   < > 3.17*  < > 2.70* 2.50* 2.20* 2.60* 2.50* 2.00* 2.30* 2.10*  CALCIUM 9.7 9.2 9.0 8.8* 8.3*  --   --    --  8.9  --  8.7*  --   --   --   --   --   --   --   PHOS 5.8* 4.4 3.5 3.0 2.9  --  2.5  --  2.3*  2.3*  --  1.8*  --   --   --   --   --   --   --   < > = values in this interval not displayed. CBC  Recent Labs Lab 01/22/17 0412 01/23/17 0404 01/23/17 0821 01/24/17 0347  01/25/17 0413  01/25/17 0759 01/25/17 0803 01/25/17 1002 01/25/17 1008  WBC 9.9 8.5  --  6.7  --  7.9  --   --   --   --   --   NEUTROABS  --   --   --   --   --  5.9  --   --   --   --   --   HGB 9.4* 10.0*  --  9.3*  < > 9.4*  < > 10.9* 11.9* 10.5* 11.9*  HCT 31.2* 31.7*  --  29.8*  < > 30.6*  < > 32.0* 35.0* 31.0* 35.0*  MCV 88.9 86.8  --  88.7  --  88.7  --   --   --   --   --   PLT 126* 75* 73* 39*  --  44*  --   --   --   --   --   < > = values in this interval not displayed.  Medications:    . chlorhexidine gluconate (MEDLINE KIT)  15 mL Mouth Rinse BID  . Chlorhexidine Gluconate Cloth  6 each Topical Daily  . feeding supplement (PRO-STAT SUGAR FREE 64)  60 mL Per Tube BID  . mouth rinse  15 mL Mouth Rinse 10 times per day  . midodrine  10 mg Oral TID WC  . pantoprazole sodium  40 mg Per Tube Q24H      Madelon Lips, MD 01/25/2017, 1:29 PM

## 2017-01-25 NOTE — Progress Notes (Signed)
Advanced Heart Failure Rounding Note  PCP: Juluis Mire, NP  Primary Cardiologist: Dr. Gwenlyn Found   Subjective:    s/p failed DCCV 3/28 and 4/5.   01/20/17 - successful DCCV to NSR, lethargic and with garbled speech. CT head showed left frontal intracranial hemorrhage.   01/21/17 MRI head with small left frontal intracerebral hemorrhage with parafalcine small SDH, trace rightward midline shift. No CVA. Intubated for airway control.   CVVHD stopped 01/16/17, restarted on 4/14. Started citrate 4/16. Went back into Afib 4/16.   Sedated on ventilator, not following commands. CVP 14-16. Doing wake up assessment and she will open her eyes but will not follow commands. Agitated.   Objective:   Weight Range: 279 lb 15.8 oz (127 kg) Body mass index is 42.57 kg/m.   Vital Signs:   Temp:  [97 F (36.1 C)-99.2 F (37.3 C)] 98.5 F (36.9 C) (04/17 0400) Pulse Rate:  [25-124] 88 (04/17 0700) Resp:  [12-26] 14 (04/17 0700) BP: (84-116)/(37-106) 84/46 (04/17 0700) SpO2:  [98 %-100 %] 99 % (04/17 0700) Arterial Line BP: (109-147)/(43-64) 113/43 (04/17 0700) FiO2 (%):  [40 %] 40 % (04/17 0400) Weight:  [279 lb 15.8 oz (127 kg)] 279 lb 15.8 oz (127 kg) (04/17 0500) Last BM Date: 01/23/17  Weight change: Filed Weights   01/23/17 0300 01/24/17 0200 01/25/17 0500  Weight: 286 lb 9.6 oz (130 kg) 288 lb 12.8 oz (131 kg) 279 lb 15.8 oz (127 kg)    Intake/Output:   Intake/Output Summary (Last 24 hours) at 01/25/17 0711 Last data filed at 01/25/17 0700  Gross per 24 hour  Intake          4286.87 ml  Output             5460 ml  Net         -1173.13 ml     Physical Exam: General:  Intubated female, NAD.  HEENT: normal, ETT in place.  Neck: supple. JVP hard to assess, but looks like 6-7 cm . LIJ in place. Carotids 2+ bilat; no bruits. No lymphadenopathy or thyromegaly appreciated. Cor: PMI nondisplaced. Irregular rate & rhythm. No rubs, gallops or murmurs. Lungs: rhonchi in bilateral  upper lobes.  Abdomen: soft, nontender, nondistended. No hepatosplenomegaly. No bruits or masses. Good bowel sounds. Extremities: no cyanosis, clubbing, rash, edema Neuro: Intubated, eyes open. Will not squeeze hands or follow simple commands.    Telemetry: Afib, rates in the 100-110's.   Labs: CBC  Recent Labs  01/24/17 0347  01/25/17 0413  01/25/17 0428 01/25/17 0639  WBC 6.7  --  7.9  --   --   --   NEUTROABS  --   --  5.9  --   --   --   HGB 9.3*  < > 9.4*  < > 11.6* 10.5*  HCT 29.8*  < > 30.6*  < > 34.0* 31.0*  MCV 88.7  --  88.7  --   --   --   PLT 39*  --  44*  --   --   --   < > = values in this interval not displayed. Basic Metabolic Panel  Recent Labs  01/24/17 1502  01/25/17 0413  01/25/17 0428 01/25/17 0639  NA 138  < > 138  < > 137 136  K 4.2  < > 3.5  < > 3.3* 3.4*  CL 97*  < > 89*  < > 87* 87*  CO2 25  --  31  --   --   --  GLUCOSE 131*  < > 146*  < > 194* 142*  BUN 24*  < > 25*  < > 24* 26*  CREATININE 3.17*  < > 2.70*  < > 2.20* 2.60*  CALCIUM 8.9  --  8.7*  --   --   --   MG 2.1  --  1.8  --   --   --   PHOS 2.3*  2.3*  --  1.8*  --   --   --   < > = values in this interval not displayed. Liver Function Tests  Recent Labs  01/24/17 1502 01/25/17 0413  ALBUMIN 2.1* 2.0*   Cardiac Enzymes  Recent Labs  01/22/17 0751  TROPONINI 0.59*    BNP: BNP (last 3 results)  Recent Labs  09/18/16 1126 11/07/16 1633 12/26/16 1643  BNP 991.0* 490.4* 1,116.0*    D-Dimer  Recent Labs  01/23/17 0821  DDIMER >20.00*   Transthoracic Echocardiography 01/11/17 Study Conclusions  - Left ventricle: The cavity size was mildly dilated. Wall   thickness was increased in a pattern of mild LVH. Left   ventricular geometry showed evidence of eccentric hypertrophy.   Systolic function was moderately reduced. The estimated ejection   fraction was in the range of 35% to 40%. Moderate diffuse   hypokinesis with no identifiable regional variations.  Acoustic   contrast opacification revealed no evidence ofthrombus. - Ventricular septum: The contour showed diastolic flattening.   These changes are consistent with RV volume overload. - Aortic valve: There was mild to moderate regurgitation directed   centrally in the LVOT. - Mitral valve: There was mild to moderate regurgitation directed   eccentrically and posteriorly. - Left atrium: The atrium was moderately dilated. - Right ventricle: The cavity size was severely dilated. Wall   thickness was normal. Systolic function was mildly reduced. - Right atrium: The atrium was severely dilated. - Tricuspid valve: There was malcoaptation of the valve leaflets.   There was moderate-severe regurgitation. - Pulmonary arteries: Systolic pressure was severely increased. PA   peak pressure: 71 mm Hg (S). - Pericardium, extracardiac: A trivial pericardial effusion was   identified posterior to the heart.    Medications:     Scheduled Medications: . ampicillin (OMNIPEN) IV  2 g Intravenous Q8H  . cefTRIAXone (ROCEPHIN)  IV  2 g Intravenous Q12H  . chlorhexidine gluconate (MEDLINE KIT)  15 mL Mouth Rinse BID  . Chlorhexidine Gluconate Cloth  6 each Topical Daily  . feeding supplement (PRO-STAT SUGAR FREE 64)  60 mL Per Tube BID  . mouth rinse  15 mL Mouth Rinse 10 times per day  . midodrine  10 mg Oral TID WC  . pantoprazole sodium  40 mg Per Tube Q24H     Infusions: . amiodarone 30 mg/hr (01/25/17 0700)  . calcium gluconate infusion for CRRT 20 g (01/25/17 0700)  . feeding supplement (VITAL HIGH PROTEIN) 1,000 mL (01/25/17 0700)  . fentaNYL infusion INTRAVENOUS 100 mcg/hr (01/25/17 0700)  . norepinephrine (LEVOPHED) Adult infusion 28.053 mcg/min (01/25/17 0700)  . dialysate (PRISMASATE) 2,000 mL/hr at 01/25/17 0521  . dialysis replacement fluid (prismasate) 200 mL/hr at 01/24/17 2302  . sodium citrate 2 %/dextrose 2.5% solution 3000 mL 250 mL/hr at 01/24/17 2339     PRN  Medications:  Place/Maintain arterial line **AND** sodium chloride, acetaminophen (TYLENOL) oral liquid 160 mg/5 mL, diphenhydrAMINE-zinc acetate, fentaNYL, midazolam, senna-docusate   Assessment/Plan   1. Acute on chronic combined systolic and diastolic CHF: EF 16-38% on  TEE 01/13/17 (down from 55% in March 2018 when first admitted). Likely tachy mediated from atrial fibrillation.  - Weight down ~120 pound with CVVHD.  - No beta blocker, ACE/ARB with AKI also still requiring pressors.  - Remains on Norepi at 28 mcg.  2. Left frontal ICD with parafalcine SDH: Trace right midline shift. No CVA. Per Neuro her small ICH/SDH does not explain the degree of her AMS. Likely a component of metabolic encephalopathy due to renal failure. - Chronic SDH from fall in Dec. 2017.  3. PAF: Failed DCCV 3/27, 4/5. Had DCCV with return to NSR on 01/20/17, went back into Afib 01/24/17.  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 SRA pending. - Continue amiodarone gtt.  4. Acute on chronic kidney disease: CVVHD restarted on 4/14. Now on citrate protocol.  - Pulling about 59m an hour. Hypotension limiting.  5. Obesity hypoventilation syndrome 6. Septic shock: Blood cultures with VRE.  - ID following. Ceftriaxone + ampicillin until 4/18.  7. 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 No change to current plan 8. Acute respiratory failure: - Intubated, per PCCM 9. Anemia: Hbg stable.  10. NSVT: Quiescent, stable.  11. Thrombocytopenia: Platelets 44 today.  - Dr. KIrene Limbosaw yesterday.  - Cannot rule out HIT syndrome. Predominant factor seems to be DIC.   Length of Stay: 3[place] NP  01/25/2017, 7:11 AM Advanced Heart Failure Team  Pager [phone]-F 7am-4pm.  Please contact [facility] for night-coverage after hours (4p -7a ) and weekends on [email or web address]  Agree with above  She continues to struggle. Now requiring increasing doses of  norepi. Remains in AF with RVR. Still on CVVHD pulling about 50/hr   Still on vent but slightly more responsive today. Neuro has seen and suspects she will have prolonged course with trach and PEG.   Platelets coming up. No active bleeding  On exam. Intubated and sedated. Occasionally responsive Dialysis cath working well  Cardiac irr irr tachy Lungs clear  Belly soft.  Legs warm mild edema  She remains critically ill. Mental status somewhat improved with CRRT. Overall I think the best plan is likely to switch to comfort care as I don't think she will do well or have much QOL with all her comorbidities. Will need to speak with her family about goals of care.   Critical Care Time devoted to patient care services described in this note is 35 Minutes. This time reflects time of care of this signee, Dr. DPierre Baliwho personally examined the patient and determined the plan of care. This critical care time does not reflect procedure time, teaching time or supervisory time of our PA/NP/resident but could involve care discussion and coordination time.   BGlori Bickers MD  6:04 PM

## 2017-01-26 ENCOUNTER — Inpatient Hospital Stay (HOSPITAL_COMMUNITY): Payer: Medicare Other

## 2017-01-26 LAB — POCT I-STAT, CHEM 8
BUN: 27 mg/dL — ABNORMAL HIGH (ref 6–20)
BUN: 30 mg/dL — ABNORMAL HIGH (ref 6–20)
BUN: 27 mg/dL — ABNORMAL HIGH (ref 6–20)
BUN: 30 mg/dL — ABNORMAL HIGH (ref 6–20)
BUN: 28 mg/dL — ABNORMAL HIGH (ref 6–20)
CALCIUM ION: 0.46 mmol/L — AB (ref 1.15–1.40)
CALCIUM ION: 0.38 mmol/L — AB (ref 1.15–1.40)
CALCIUM ION: 0.98 mmol/L — AB (ref 1.15–1.40)
CALCIUM ION: 1.11 mmol/L — AB (ref 1.15–1.40)
CHLORIDE: 90 mmol/L — AB (ref 101–111)
CREATININE: 1.8 mg/dL — AB (ref 0.44–1.00)
CREATININE: 1.7 mg/dL — AB (ref 0.44–1.00)
Calcium, Ion: 0.98 mmol/L — ABNORMAL LOW (ref 1.15–1.40)
Chloride: 82 mmol/L — ABNORMAL LOW (ref 101–111)
Chloride: 85 mmol/L — ABNORMAL LOW (ref 101–111)
Chloride: 82 mmol/L — ABNORMAL LOW (ref 101–111)
Chloride: 85 mmol/L — ABNORMAL LOW (ref 101–111)
Creatinine, Ser: 2.1 mg/dL — ABNORMAL HIGH (ref 0.44–1.00)
Creatinine, Ser: 1.7 mg/dL — ABNORMAL HIGH (ref 0.44–1.00)
Creatinine, Ser: 2 mg/dL — ABNORMAL HIGH (ref 0.44–1.00)
GLUCOSE: 152 mg/dL — AB (ref 65–99)
GLUCOSE: 98 mg/dL (ref 65–99)
Glucose, Bld: 139 mg/dL — ABNORMAL HIGH (ref 65–99)
Glucose, Bld: 183 mg/dL — ABNORMAL HIGH (ref 65–99)
Glucose, Bld: 202 mg/dL — ABNORMAL HIGH (ref 65–99)
HCT: 35 % — ABNORMAL LOW (ref 36.0–46.0)
HCT: 31 % — ABNORMAL LOW (ref 36.0–46.0)
HCT: 33 % — ABNORMAL LOW (ref 36.0–46.0)
HCT: 31 % — ABNORMAL LOW (ref 36.0–46.0)
HEMATOCRIT: 30 % — AB (ref 36.0–46.0)
HEMOGLOBIN: 10.2 g/dL — AB (ref 12.0–15.0)
HEMOGLOBIN: 11.9 g/dL — AB (ref 12.0–15.0)
HEMOGLOBIN: 10.5 g/dL — AB (ref 12.0–15.0)
HEMOGLOBIN: 11.2 g/dL — AB (ref 12.0–15.0)
Hemoglobin: 10.5 g/dL — ABNORMAL LOW (ref 12.0–15.0)
POTASSIUM: 2.8 mmol/L — AB (ref 3.5–5.1)
Potassium: 3 mmol/L — ABNORMAL LOW (ref 3.5–5.1)
Potassium: 3 mmol/L — ABNORMAL LOW (ref 3.5–5.1)
Potassium: 2.9 mmol/L — ABNORMAL LOW (ref 3.5–5.1)
Potassium: 3.4 mmol/L — ABNORMAL LOW (ref 3.5–5.1)
SODIUM: 133 mmol/L — AB (ref 135–145)
SODIUM: 137 mmol/L (ref 135–145)
Sodium: 137 mmol/L (ref 135–145)
Sodium: 135 mmol/L (ref 135–145)
Sodium: 136 mmol/L (ref 135–145)
TCO2: 33 mmol/L (ref 0–100)
TCO2: 38 mmol/L (ref 0–100)
TCO2: 35 mmol/L (ref 0–100)
TCO2: 35 mmol/L (ref 0–100)
TCO2: 33 mmol/L (ref 0–100)

## 2017-01-26 LAB — RENAL FUNCTION PANEL
ALBUMIN: 1.8 g/dL — AB (ref 3.5–5.0)
ANION GAP: 17 — AB (ref 5–15)
Albumin: 1.9 g/dL — ABNORMAL LOW (ref 3.5–5.0)
Anion gap: 18 — ABNORMAL HIGH (ref 5–15)
BUN: 28 mg/dL — AB (ref 6–20)
BUN: 25 mg/dL — AB (ref 6–20)
CHLORIDE: 86 mmol/L — AB (ref 101–111)
CO2: 34 mmol/L — ABNORMAL HIGH (ref 22–32)
CO2: 27 mmol/L (ref 22–32)
Calcium: 9.2 mg/dL (ref 8.9–10.3)
Calcium: 8.5 mg/dL — ABNORMAL LOW (ref 8.9–10.3)
Chloride: 94 mmol/L — ABNORMAL LOW (ref 101–111)
Creatinine, Ser: 2.25 mg/dL — ABNORMAL HIGH (ref 0.44–1.00)
Creatinine, Ser: 1.7 mg/dL — ABNORMAL HIGH (ref 0.44–1.00)
GFR calc Af Amer: 26 mL/min — ABNORMAL LOW (ref >60)
GFR calc Af Amer: 37 mL/min — ABNORMAL LOW (ref >60)
GFR calc non Af Amer: 23 mL/min — ABNORMAL LOW (ref >60)
GFR, EST NON AFRICAN AMERICAN: 32 mL/min — AB (ref >60)
GLUCOSE: 150 mg/dL — AB (ref 65–99)
Glucose, Bld: 114 mg/dL — ABNORMAL HIGH (ref 65–99)
PHOSPHORUS: 1.7 mg/dL — AB (ref 2.5–4.6)
PHOSPHORUS: 2.5 mg/dL (ref 2.5–4.6)
POTASSIUM: 2.8 mmol/L — AB (ref 3.5–5.1)
POTASSIUM: 3.7 mmol/L (ref 3.5–5.1)
Sodium: 138 mmol/L (ref 135–145)
Sodium: 138 mmol/L (ref 135–145)

## 2017-01-26 LAB — CBC WITH DIFFERENTIAL/PLATELET
BASOS ABS: 0.1 10*3/uL (ref 0.0–0.1)
Basophils Relative: 1 %
EOS PCT: 3 %
Eosinophils Absolute: 0.3 10*3/uL (ref 0.0–0.7)
HEMATOCRIT: 30 % — AB (ref 36.0–46.0)
HEMOGLOBIN: 9 g/dL — AB (ref 12.0–15.0)
LYMPHS ABS: 1.2 10*3/uL (ref 0.7–4.0)
Lymphocytes Relative: 11 %
MCH: 26.6 pg (ref 26.0–34.0)
MCHC: 30 g/dL (ref 30.0–36.0)
MCV: 88.8 fL (ref 78.0–100.0)
MONOS PCT: 6 %
Monocytes Absolute: 0.6 10*3/uL (ref 0.1–1.0)
NEUTROS ABS: 8.4 10*3/uL — AB (ref 1.7–7.7)
Neutrophils Relative %: 79 %
Platelets: 45 10*3/uL — ABNORMAL LOW (ref 150–400)
RBC: 3.38 MIL/uL — AB (ref 3.87–5.11)
RDW: 28.4 % — ABNORMAL HIGH (ref 11.5–15.5)
WBC MORPHOLOGY: INCREASED
WBC: 10.6 10*3/uL — ABNORMAL HIGH (ref 4.0–10.5)

## 2017-01-26 LAB — GLUCOSE, CAPILLARY
GLUCOSE-CAPILLARY: 112 mg/dL — AB (ref 65–99)
GLUCOSE-CAPILLARY: 145 mg/dL — AB (ref 65–99)
Glucose-Capillary: 126 mg/dL — ABNORMAL HIGH (ref 65–99)
Glucose-Capillary: 98 mg/dL (ref 65–99)
Glucose-Capillary: 115 mg/dL — ABNORMAL HIGH (ref 65–99)

## 2017-01-26 LAB — PTT FACTOR INHIBITOR (MIXING STUDY)
APTT 1 1 NORMAL PLASMA: 31.6 s — AB (ref 22.9–30.2)
APTT 11 NP INCUB. MIX CTL: 37.4 s — AB (ref 22.9–30.2)
APTT 11 NP MIX, 60 MIN, INCUB.: 36.4 s — AB (ref 22.9–30.2)
aPTT 1:1 Mix Saline: 119.1 s
aPTT: 55.2 s — ABNORMAL HIGH (ref 22.9–30.2)

## 2017-01-26 LAB — CORTISOL: Cortisol, Plasma: 29.4 ug/dL

## 2017-01-26 LAB — CALCIUM, IONIZED: Calcium, Ionized, Serum: 3.9 mg/dL — ABNORMAL LOW (ref 4.5–5.6)

## 2017-01-26 LAB — MRSA PCR SCREENING: MRSA by PCR: NEGATIVE

## 2017-01-26 MED ORDER — INSULIN ASPART 100 UNIT/ML ~~LOC~~ SOLN
2.0000 [IU] | SUBCUTANEOUS | Status: DC
  Administered 2017-01-26: 2 [IU] via SUBCUTANEOUS
  Administered 2017-01-27: 4 [IU] via SUBCUTANEOUS
  Administered 2017-01-27: 2 [IU] via SUBCUTANEOUS
  Administered 2017-01-27 (×5): 4 [IU] via SUBCUTANEOUS
  Administered 2017-01-28 (×2): 2 [IU] via SUBCUTANEOUS
  Administered 2017-01-28 (×3): 4 [IU] via SUBCUTANEOUS
  Administered 2017-01-28: 2 [IU] via SUBCUTANEOUS
  Administered 2017-01-29 (×3): 4 [IU] via SUBCUTANEOUS
  Administered 2017-01-29 (×2): 2 [IU] via SUBCUTANEOUS
  Administered 2017-01-29 – 2017-01-30 (×6): 4 [IU] via SUBCUTANEOUS
  Administered 2017-01-31: 2 [IU] via SUBCUTANEOUS
  Administered 2017-01-31: 4 [IU] via SUBCUTANEOUS
  Administered 2017-01-31 (×2): 6 [IU] via SUBCUTANEOUS
  Administered 2017-02-01 – 2017-02-02 (×4): 2 [IU] via SUBCUTANEOUS

## 2017-01-26 MED ORDER — SODIUM CHLORIDE 0.9 % IV SOLN
30.0000 meq | Freq: Once | INTRAVENOUS | Status: AC
  Administered 2017-01-26: 30 meq via INTRAVENOUS
  Filled 2017-01-26: qty 15

## 2017-01-26 MED ORDER — VASOPRESSIN 20 UNIT/ML IV SOLN
0.0300 [IU]/min | INTRAVENOUS | Status: DC
  Administered 2017-01-26 – 2017-02-01 (×7): 0.03 [IU]/min via INTRAVENOUS
  Administered 2017-02-02: 0.02 [IU]/min via INTRAVENOUS
  Filled 2017-01-26 (×8): qty 2

## 2017-01-26 MED ORDER — HYDROCORTISONE NA SUCCINATE PF 100 MG IJ SOLR
50.0000 mg | Freq: Four times a day (QID) | INTRAMUSCULAR | Status: DC
  Administered 2017-01-26 – 2017-02-03 (×32): 50 mg via INTRAVENOUS
  Filled 2017-01-26 (×32): qty 2

## 2017-01-26 MED ORDER — LIDOCAINE HCL (PF) 1 % IJ SOLN
INTRAMUSCULAR | Status: AC
  Administered 2017-01-26: 16:00:00
  Filled 2017-01-26: qty 5

## 2017-01-26 MED ORDER — VITAL HIGH PROTEIN PO LIQD
1000.0000 mL | ORAL | Status: DC
  Administered 2017-01-27: 1000 mL

## 2017-01-26 MED ORDER — GERHARDT'S BUTT CREAM
TOPICAL_CREAM | Freq: Every day | CUTANEOUS | Status: DC | PRN
  Administered 2017-01-30: 22:00:00 via TOPICAL
  Administered 2017-01-31: 1 via TOPICAL
  Filled 2017-01-26 (×2): qty 1

## 2017-01-26 MED ORDER — PRISMASOL BGK 4/2.5 32-4-2.5 MEQ/L IV SOLN
INTRAVENOUS | Status: DC
  Administered 2017-01-26 – 2017-01-29 (×4): via INTRAVENOUS_CENTRAL
  Filled 2017-01-26 (×8): qty 5000

## 2017-01-26 MED ORDER — PRISMASOL BGK 4/2.5 32-4-2.5 MEQ/L IV SOLN
INTRAVENOUS | Status: DC
  Administered 2017-01-26 – 2017-01-29 (×13): via INTRAVENOUS_CENTRAL
  Filled 2017-01-26 (×32): qty 5000

## 2017-01-26 MED ORDER — PRO-STAT SUGAR FREE PO LIQD
60.0000 mL | Freq: Two times a day (BID) | ORAL | Status: DC
  Administered 2017-01-26 – 2017-01-27 (×2): 60 mL via ORAL
  Filled 2017-01-26 (×2): qty 60

## 2017-01-26 NOTE — Progress Notes (Addendum)
[facility] Progress Note    Assessment/ Plan:   1. Acute oliguric kidney injury: remains anuric.   We will continue CRRT; bicarb rising and at risk for citrate toxicity- PCCM to ask neuro if resumption of low-dose heparin with CRRT OK when plts are > 50.  Keeping even given no edema and pressor requirement; CXR with some mild interstitial edema; watching closely.   2. Afib with RVR: on amio gtt at present 3.  Shock 2/2 VRE bacteremia: remains on pressor, on antibiotics (end date 4/18) 4.  Acute on chronic hypoxic respiratory failure: remains intubated 5.  SDH: awake today, more interactive.  Subjective:    Much more awake and alert today.  Following commands.  On PS trial.   Objective:   BP (!) 76/59   Pulse (!) 107   Temp 99.6 F (37.6 C) (Oral)   Resp 17   Ht 5' 8"  (1.727 m)   Wt 124 kg (273 lb 5.9 oz)   LMP 09/07/2011   SpO2 100%   BMI 41.57 kg/m   Intake/Output Summary (Last 24 hours) at 01/26/17 0923 Last data filed at 01/26/17 0900  Gross per 24 hour  Intake          4950.85 ml  Output             4245 ml  Net           705.85 ml   Weight change: -3 kg (-6 lb 9.8 oz)  Physical Exam: BFX:OVAN open, looking around CVS: tachy, irregular Resp:coarse bilaterally Abd: obese Ext: no edema ACCESS: R TDC in place, dressing c/d/intact.  Imaging: Dg Chest Port 1 View  Result Date: 01/26/2017 CLINICAL DATA:  Intubated patient, respiratory failure. EXAM: PORTABLE CHEST 1 VIEW COMPARISON:  Portable chest x-ray of January 24, 2017 FINDINGS: The lungs are reasonably well inflated. Patchy interstitial and alveolar opacities persist bilaterally. The retrocardiac region on the left remains dense but slight improved aeration is noted here today. The cardiac silhouette remains enlarged. The central pulmonary vascularity remains engorged. A small left pleural effusion is suspected. The endotracheal tube tip lies 2.5 cm above the carina. The feeding tube tip projects  below the inferior margin of the image. The dual-lumen dialysis catheter tip projects over the distal third of the SVC. The left internal jugular venous catheter tip projects over the proximal SVC. IMPRESSION: Slight interval improvement in the appearance of the lungs consistent with resolving edema or pneumonia or combination of the two. Persistent left lower lobe atelectasis or pneumonia with probable small left pleural effusion. Electronically Signed   By: David  Martinique M.D.   On: 01/26/2017 07:57   Dg Abd Portable 1v  Result Date: 01/25/2017 CLINICAL DATA:  61 year old female feeding tube placement. Initial encounter. EXAM: PORTABLE ABDOMEN - 1 VIEW COMPARISON:  01/22/2017 and earlier. FINDINGS: Portable AP supine view at 1050 hours. Enteric tube courses through the stomach and into the duodenum with tip at the level of the ligament of Treitz. Negative visible bowel gas pattern. Partially visible increased left lung base opacity. No acute osseous abnormality identified. IMPRESSION: 1. Enteric tube placed, tip at the ligament of Treitz level. 2. Negative visible bowel gas pattern. Electronically Signed   By: Genevie Ann M.D.   On: 01/25/2017 11:11    Labs: BMET  Recent Labs Lab 01/23/17 0404 01/23/17 1700 01/24/17 0347  01/24/17 1016  01/24/17 1502  01/25/17 0413  01/25/17 1600 01/25/17 1638 01/25/17 1806 01/25/17 1810 01/26/17 0200 01/26/17 0205  01/26/17 0408 01/26/17 0413 01/26/17 0419  NA 134* 136 137  < >  --   < > 138  < > 138  < > 140  --  135 137 133* 137 135 137 138  K 4.3 4.3 4.2  < >  --   < > 4.2  < > 3.5  < > 3.2*  --  3.0* 3.0* 3.0* 3.0* 2.8* 2.9* 2.8*  CL 99* 98* 101  < >  --   < > 97*  < > 89*  < > 87*  --  84* 83* 85* 82* 85* 82* 86*  CO2 20* 21* 22  --   --   --  25  --  31  --  34*  --   --   --   --   --   --   --  34*  GLUCOSE 103* 94 108*  < >  --   < > 131*  < > 146*  < > 137*  --  145* 194* 139* 183* 152* 202* 150*  BUN 31* 25* 24*  < >  --   < > 24*  < > 25*  <  > 27*  --  29* 27* 30* 27* 30* 27* 28*  CREATININE 5.19* 4.26* 3.63*  < >  --   < > 3.17*  < > 2.70*  < > 2.45*  --  2.30* 1.90* 2.10* 1.80* 2.00* 1.70* 2.25*  CALCIUM 9.0 8.8* 8.3*  --   --   --  8.9  --  8.7*  --  9.2  --   --   --   --   --   --   --  9.2  PHOS 3.5 3.0 2.9  --  2.5  --  2.3*  2.3*  --  1.8*  --  1.8* 1.8*  --   --   --   --   --   --  1.7*  < > = values in this interval not displayed. CBC  Recent Labs Lab 01/23/17 0404 01/23/17 0821 01/24/17 0347  01/25/17 0413  01/26/17 0205 01/26/17 0408 01/26/17 0413 01/26/17 0419  WBC 8.5  --  6.7  --  7.9  --   --   --   --  10.6*  NEUTROABS  --   --   --   --  5.9  --   --   --   --  8.4*  HGB 10.0*  --  9.3*  < > 9.4*  < > 11.9* 10.5* 11.2* 9.0*  HCT 31.7*  --  29.8*  < > 30.6*  < > 35.0* 31.0* 33.0* 30.0*  MCV 86.8  --  88.7  --  88.7  --   --   --   --  88.8  PLT 75* 73* 39*  --  44*  --   --   --   --  45*  < > = values in this interval not displayed.  Medications:    . chlorhexidine gluconate (MEDLINE KIT)  15 mL Mouth Rinse BID  . Chlorhexidine Gluconate Cloth  6 each Topical Daily  . feeding supplement (PRO-STAT SUGAR FREE 64)  60 mL Per Tube BID  . mouth rinse  15 mL Mouth Rinse 10 times per day  . midodrine  10 mg Oral TID WC  . pantoprazole sodium  40 mg Per Tube Q24H      Madelon Lips, MD 01/26/2017, 9:23 AM

## 2017-01-26 NOTE — Procedures (Signed)
Extubation Procedure Note  Patient Details:   Name: Paige Marshall DOB: 1956/09/13 MRN: [id number]   Airway Documentation:  Airway 7.5 mm (Active)  Secured at (cm) 24 cm 01/26/2017  7:48 AM  Measured From Lips 01/26/2017  7:48 AM  [place] 01/26/2017  7:48 AM  Secured By [employer] 01/26/2017  7:44 AM  Tube Holder Repositioned Yes 01/26/2017  7:44 AM  Cuff Pressure (cm H2O) 28 cm H2O 01/25/2017  3:48 PM  Site Condition Dry 01/26/2017  7:44 AM    Evaluation  O2 sats: stable throughout Complications: No apparent complications Patient did tolerate procedure well. Bilateral Breath Sounds: Clear, Diminished   Yes  4l/min [place] Incentive spirometer instructed   [place]onda Standard 01/26/2017, 11:13 AM

## 2017-01-26 NOTE — Progress Notes (Signed)
Advanced Heart Failure Rounding Note  PCP: Juluis Mire, NP  Primary Cardiologist: Dr. Gwenlyn Found   Subjective:    s/p failed DCCV 3/28 and 4/5.   01/20/17 - successful DCCV to NSR, lethargic and with garbled speech. CT head showed left frontal intracranial hemorrhage.   01/21/17 MRI head with small left frontal intracerebral hemorrhage with parafalcine small SDH, trace rightward midline shift. No CVA. Intubated for airway control.   CVVHD stopped 01/16/17, restarted on 4/14. Started citrate 4/16. Went back into Afib 4/16. Citrate stopped 01/26/17 with risk of toxicity. Remains on CVVHD without anticoagulation.   Sedated on ventilatory. Opens eyes to voice, following simple commands. CVP 16. Less agitated today.    Objective:   Weight Range: 273 lb 5.9 oz (124 kg) Body mass index is 41.57 kg/m.   Vital Signs:   Temp:  [99 F (37.2 C)-99.6 F (37.6 C)] 99.6 F (37.6 C) (04/18 0000) Pulse Rate:  [25-135] 25 (04/18 0915) Resp:  [10-21] 15 (04/18 0915) BP: (64-124)/(32-75) 79/63 (04/18 0900) SpO2:  [90 %-100 %] 100 % (04/18 0929) Arterial Line BP: (92-167)/(34-92) 127/57 (04/18 0915) FiO2 (%):  [40 %] 40 % (04/18 0744) Weight:  [273 lb 5.9 oz (124 kg)] 273 lb 5.9 oz (124 kg) (04/18 0500) Last BM Date: 01/26/17  Weight change: Filed Weights   01/24/17 0200 01/25/17 0500 01/26/17 0500  Weight: 288 lb 12.8 oz (131 kg) 279 lb 15.8 oz (127 kg) 273 lb 5.9 oz (124 kg)    Intake/Output:   Intake/Output Summary (Last 24 hours) at 01/26/17 1004 Last data filed at 01/26/17 0900  Gross per 24 hour  Intake          4766.62 ml  Output             4046 ml  Net           720.62 ml     Physical Exam: General: Intubated, NAD.  HEENT: Normocephalic, ETT in place.   Neck: supple.Hard to assess JVP, elevated.LIJ in place.  Carotids 2+ bilat; no bruits. No lymphadenopathy or thyromegaly appreciated. Cor: PMI nondisplaced.Irregular rate and rhythm, tachycardic. No rubs, gallops or  murmurs. Lungs: Rhonchi in bilateral upper lobes.  Abdomen: soft, nontender, nondistended. No hepatosplenomegaly. No bruits or masses. Good bowel sounds.  Extremities: no cyanosis, clubbing, rash. Trace lower extremity edema.  Neuro: Intubated, eyes open to voice. Will follow simple commands.    Telemetry: Afib rates in the 100-120's.  Personally reviewed     Labs: CBC  Recent Labs  01/25/17 0413  01/26/17 0413 01/26/17 0419  WBC 7.9  --   --  10.6*  NEUTROABS 5.9  --   --  8.4*  HGB 9.4*  < > 11.2* 9.0*  HCT 30.6*  < > 33.0* 30.0*  MCV 88.7  --   --  88.8  PLT 44*  --   --  45*  < > = values in this interval not displayed. Basic Metabolic Panel  Recent Labs  01/25/17 0413  01/25/17 1600 01/25/17 1638  01/26/17 0413 01/26/17 0419  NA 138  < > 140  --   < > 137 138  K 3.5  < > 3.2*  --   < > 2.9* 2.8*  CL 89*  < > 87*  --   < > 82* 86*  CO2 31  --  34*  --   --   --  34*  GLUCOSE 146*  < > 137*  --   < >  202* 150*  BUN 25*  < > 27*  --   < > 27* 28*  CREATININE 2.70*  < > 2.45*  --   < > 1.70* 2.25*  CALCIUM 8.7*  --  9.2  --   --   --  9.2  MG 1.8  --   --  1.7  --   --   --   PHOS 1.8*  --  1.8* 1.8*  --   --  1.7*  < > = values in this interval not displayed. Liver Function Tests  Recent Labs  01/25/17 1600 01/26/17 0419  ALBUMIN 2.0* 1.9*    BNP: BNP (last 3 results)  Recent Labs  09/18/16 1126 11/07/16 1633 12/26/16 1643  BNP 991.0* 490.4* 1,116.0*    D-Dimer No results for input(s): DDIMER in the last 72 hours. Transthoracic Echocardiography 01/11/17 Study Conclusions  - Left ventricle: The cavity size was mildly dilated. Wall   thickness was increased in a pattern of mild LVH. Left   ventricular geometry showed evidence of eccentric hypertrophy.   Systolic function was moderately reduced. The estimated ejection   fraction was in the range of 35% to 40%. Moderate diffuse   hypokinesis with no identifiable regional variations. Acoustic    contrast opacification revealed no evidence ofthrombus. - Ventricular septum: The contour showed diastolic flattening.   These changes are consistent with RV volume overload. - Aortic valve: There was mild to moderate regurgitation directed   centrally in the LVOT. - Mitral valve: There was mild to moderate regurgitation directed   eccentrically and posteriorly. - Left atrium: The atrium was moderately dilated. - Right ventricle: The cavity size was severely dilated. Wall   thickness was normal. Systolic function was mildly reduced. - Right atrium: The atrium was severely dilated. - Tricuspid valve: There was malcoaptation of the valve leaflets.   There was moderate-severe regurgitation. - Pulmonary arteries: Systolic pressure was severely increased. PA   peak pressure: 71 mm Hg (S). - Pericardium, extracardiac: A trivial pericardial effusion was   identified posterior to the heart.    Medications:     Scheduled Medications: . chlorhexidine gluconate (MEDLINE KIT)  15 mL Mouth Rinse BID  . Chlorhexidine Gluconate Cloth  6 each Topical Daily  . feeding supplement (PRO-STAT SUGAR FREE 64)  60 mL Per Tube BID  . insulin aspart  2-6 Units Subcutaneous Q4H  . mouth rinse  15 mL Mouth Rinse 10 times per day  . midodrine  10 mg Oral TID WC  . pantoprazole sodium  40 mg Per Tube Q24H    Infusions: . sodium chloride Stopped (01/24/17 0330)  . amiodarone 30 mg/hr (01/26/17 0800)  . ampicillin (OMNIPEN) IV    . cefTRIAXone (ROCEPHIN)  IV    . feeding supplement (VITAL HIGH PROTEIN) Stopped (01/26/17 0928)  . fentaNYL infusion INTRAVENOUS 150 mcg/hr (01/26/17 0800)  . norepinephrine (LEVOPHED) Adult infusion 32 mcg/min (01/26/17 0800)  . dialysis replacement fluid (prismasate) 750 mL/hr at 01/26/17 0533  . dialysis replacement fluid (prismasate)    . dialysate (PRISMASATE)    . sodium citrate 2 %/dextrose 2.5% solution 3000 mL 250 mL/hr at 01/26/17 0210    PRN  Medications: Place/Maintain arterial line **AND** sodium chloride, acetaminophen (TYLENOL) oral liquid 160 mg/5 mL, diphenhydrAMINE-zinc acetate, fentaNYL, Gerhardt's butt cream, midazolam, senna-docusate   Assessment/Plan   1. Acute on chronic combined systolic and diastolic CHF: EF 50-35% on TEE 01/13/17 (down from 55% in March 2018 when first admitted). Likely tachy  mediated from atrial fibrillation.  - Weight down ~ 120 pounds on CVVHD. - On high dose of Levo at 32 mcg. No ACE/ARB/ARNI with pressor requirement and AKI.  2. Left frontal ICD with parafalcine SDH: Trace right midline shift. No CVA. Per Neuro her small ICH/SDH does not explain the degree of her AMS. Likely a component of metabolic encephalopathy due to renal failure. - EEG with severe generalized suppressed and slowing of brain activity consistent with metabolic encephalopathy.  3. PAF: Failed DCCV 3/27, 4/5. Had DCCV with return to NSR on 01/20/17, went back into Afib 01/24/17.  - No anticoagulation with SDH.  - Continue amiodarone gtt.  4. Acute on chronic kidney disease: CVVHD restarted on 4/14. Citrate stopped today, will remain on CVVHD  - Keeping even, not able to pull much volume.  5. Obesity hypoventilation syndrome 6. Septic shock: Blood cultures with VRE.  - On ampicillin, rocephin.  7. 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  - No change  8. Acute respiratory failure: - Intubated, per PCCM - More awake today.  9. Anemia: Hgb stable.  10. NSVT: Quiescent, stable.  - Continue Amio.  11. Thrombocytopenia: Platelets 44 today.  - Dr. Irene Limbo following.  - Cannot rule out HIT syndrome, predominant factor seems to be DIC  Length of Stay: [place], NP  01/26/2017, 10:04 AM Advanced Heart Failure Team  Pager [phone] M-F 7am-4pm.  Please contact [facility] for night-coverage after hours (4p -7a ) and weekends on [email or web address]  Agree.   Failed vent wean yesterday. She is much  more alert today. Following commands. Remains in AF with RVR despite IV amio. Norepi back up to 30 mcg/min. Hopefully we can wean as sedation is weaned. Remains on CVVHD. Keeping even. CVP 17   On exam SBP ~110 Intubated. Arousable. Follows commands.  Cardiac IRR iRR tachy Lungs clear anteriorly Extremities are warm with mild edema   She is improving slowly but still on high-dose norepi. Would continue to try and wean went and pressors. Agree with not pulling fluid further with CVVHD as she is preload dependent with RV failure. Continue amio for AF.   Long talk with her husband and I suggested that we will continue to treat aggressively but if unable to wean from vent or cannot get her off pressors would NOT pursue trach and PEG.   Critical Care Time devoted to patient care services described in this note is 45 Minutes. This time reflects time of care of this signee, Dr. Pierre Bali who personally examined the patient and determined the plan of care. This critical care time does not reflect procedure time, teaching time or supervisory time of our PA/NP/resident but could involve care discussion and coordination time.  Glori Bickers, MD  11:35 AM

## 2017-01-26 NOTE — Procedures (Signed)
Patient seen and examined on CRRT QB 250, UF goal net even. Treatment adjusted as needed.  Will have to stop citrate given risk of toxicity  Madelon Lips MD [facility]. 9:32 AM

## 2017-01-26 NOTE — Progress Notes (Signed)
Dr. Joelyn Oms paged about pt's K+ 2.8 - awaiting new orders.

## 2017-01-26 NOTE — Progress Notes (Signed)
100 mls of fentanyl wasted in sink with San Jetty, RN.  Rowe Pavy, RN

## 2017-01-26 NOTE — Progress Notes (Signed)
PULMONARY / CRITICAL CARE MEDICINE   Name: Paige Marshall MRN: [id number] DOB: 1956/02/08    ADMISSION DATE:  12/26/2016 CONSULTATION DATE:  01/20/2017  REFERRING MD:  Dr. Evette Doffing   CHIEF COMPLAINT:  AMS  Brief:   61 year old woman with poorly controlled OHS, chronic combined systolic and diastolic CHF, a-fib/a-flutter on chronic anticoagulation, hypertension, COPD, CKD Stage 3. Admitted to ED from [place] on 3/18 for  acute decompensated CHF complicated by cardiorenal syndrome. During stay developed acute on chronic kidney disease requiring CRRT, now being followed by nephrology for PRN HD. Blood culture positive for VRE bacteremia being treated with ampicillin and ceftriaxone. Transferred to step-down on 4/11. On 4/12 noted to be more lethargic and aphasic. CT Head revealed mixed density mass like area along the surface of the anterior frontal lobe with cytotoxic edema as well as a rounded 2 cm hemorrhage.  SUBJECTIVE:  Improvement of mental status. Remains on fentanyl gtt, CRRT, and mechanical ventilation   VITAL SIGNS: BP (!) 76/59   Pulse (!) 107   Temp 99.6 F (37.6 C) (Oral)   Resp 17   Ht 5\' 8"  (1.727 m)   Wt 124 kg (273 lb 5.9 oz)   LMP 09/07/2011   SpO2 100%   BMI 41.57 kg/m   HEMODYNAMICS: CVP:  [15 mmHg] 15 mmHg  VENTILATOR SETTINGS: Vent Mode: PRVC FiO2 (%):  [40 %] 40 % Set Rate:  [15 bmp] 15 bmp Vt Set:  [540 mL] 540 mL PEEP:  [5 cmH20] 5 cmH20 Pressure Support:  [5 cmH20] 5 cmH20 Plateau Pressure:  [17 cmH20-22 cmH20] 17 cmH20  INTAKE / OUTPUT: I/O last 3 completed shifts: In: 6836.1 [I.V.:4648.1; NG/GT:1700; IV Piggyback:488] Out: 7353 [Other:7119]  PHYSICAL EXAMINATION: General:  Adult female, no distress  Neuro:  Alert, follows commands, moves extremities  HEENT:  ETT in place  Cardiovascular:  Tachy, no MRG, NI S1/S2 Lungs:  Diminished breath sounds, no wheeze, non-labored  Abdomen:  Obese, active bowel sounds  Musculoskeletal:  No acute   Skin:  Warm, dry, intact   LABS:  BMET  Recent Labs Lab 01/25/17 0413  01/25/17 1600  01/26/17 0408 01/26/17 0413 01/26/17 0419  NA 138  < > 140  < > 135 137 138  K 3.5  < > 3.2*  < > 2.8* 2.9* 2.8*  CL 89*  < > 87*  < > 85* 82* 86*  CO2 31  --  34*  --   --   --  34*  BUN 25*  < > 27*  < > 30* 27* 28*  CREATININE 2.70*  < > 2.45*  < > 2.00* 1.70* 2.25*  GLUCOSE 146*  < > 137*  < > 152* 202* 150*  < > = values in this interval not displayed.  Electrolytes  Recent Labs Lab 01/24/17 1502 01/25/17 0413 01/25/17 1600 01/25/17 1638 01/26/17 0419  CALCIUM 8.9 8.7* 9.2  --  9.2  MG 2.1 1.8  --  1.7  --   PHOS 2.3*  2.3* 1.8* 1.8* 1.8* 1.7*    CBC  Recent Labs Lab 01/24/17 0347  01/25/17 0413  01/26/17 0408 01/26/17 0413 01/26/17 0419  WBC 6.7  --  7.9  --   --   --  10.6*  HGB 9.3*  < > 9.4*  < > 10.5* 11.2* 9.0*  HCT 29.8*  < > 30.6*  < > 31.0* 33.0* 30.0*  PLT 39*  --  44*  --   --   --  45*  < > = values in this interval not displayed.  Coag's  Recent Labs Lab 01/23/17 0404 01/23/17 0821 01/24/17 1509  APTT  --  62* 66*  55.2*  INR 1.38 1.33 1.26    Sepsis Markers  Recent Labs Lab 01/22/17 0751  LATICACIDVEN 1.4    ABG  Recent Labs Lab 01/22/17 0728 01/23/17 0330 01/24/17 0440  PHART 7.295* 7.316* 7.340*  PCO2ART 38.3 39.6 41.4  PO2ART 59.0* 70.3* 75.6*    Liver Enzymes  Recent Labs Lab 01/20/17 0930  01/25/17 0413 01/25/17 1600 01/26/17 0419  AST 33  --   --   --   --   ALT 18  --   --   --   --   ALKPHOS 74  --   --   --   --   BILITOT 0.8  --   --   --   --   ALBUMIN 2.8*  < > 2.0* 2.0* 1.9*  < > = values in this interval not displayed.  Cardiac Enzymes  Recent Labs Lab 01/22/17 0751  TROPONINI 0.59*    Glucose  Recent Labs Lab 01/25/17 0833 01/25/17 1216 01/25/17 1608 01/25/17 1956 01/26/17 0055 01/26/17 0405  GLUCAP 131* 135* 140* 140* 112* 145*    Imaging Dg Chest Port 1 View  Result Date:  01/26/2017 CLINICAL DATA:  Intubated patient, respiratory failure. EXAM: PORTABLE CHEST 1 VIEW COMPARISON:  Portable chest x-ray of January 24, 2017 FINDINGS: The lungs are reasonably well inflated. Patchy interstitial and alveolar opacities persist bilaterally. The retrocardiac region on the left remains dense but slight improved aeration is noted here today. The cardiac silhouette remains enlarged. The central pulmonary vascularity remains engorged. A small left pleural effusion is suspected. The endotracheal tube tip lies 2.5 cm above the carina. The feeding tube tip projects below the inferior margin of the image. The dual-lumen dialysis catheter tip projects over the distal third of the SVC. The left internal jugular venous catheter tip projects over the proximal SVC. IMPRESSION: Slight interval improvement in the appearance of the lungs consistent with resolving edema or pneumonia or combination of the two. Persistent left lower lobe atelectasis or pneumonia with probable small left pleural effusion. Electronically Signed   By: David  Martinique M.D.   On: 01/26/2017 07:57   Dg Abd Portable 1v  Result Date: 01/25/2017 CLINICAL DATA:  61 year old female feeding tube placement. Initial encounter. EXAM: PORTABLE ABDOMEN - 1 VIEW COMPARISON:  01/22/2017 and earlier. FINDINGS: Portable AP supine view at 1050 hours. Enteric tube courses through the stomach and into the duodenum with tip at the level of the ligament of Treitz. Negative visible bowel gas pattern. Partially visible increased left lung base opacity. No acute osseous abnormality identified. IMPRESSION: 1. Enteric tube placed, tip at the ligament of Treitz level. 2. Negative visible bowel gas pattern. Electronically Signed   By: Genevie Ann M.D.   On: 01/25/2017 11:11   STUDIES: CXR 4/1 > Cardiomegaly with mild persistent pulmonary edema similar to the previous exam CXR 4/9 >Cardiomegaly without acute disease  CT Head 4/11 > mixed density mass like area  along the surface of the anterior frontal lobe, with cytotoxic type edema as well as a rounded 2 cm area of hemorrhage EEG 4/13 >> CT Head 4/13 > Left frontal mixed attenuation collection, hyperdense acute hemorrhage posteriorly, max thickness between 13-15 mm, acute left parafalcine subdural hemorrhage, 2 mm left to right shift  MRI Brain 4/13 >>  CULTURES: Blood 4/01 >  E faecalis, vancomycin resistant  ANTIBIOTICS: Vancomycin 4/01 > 4/03 Daptomycin 4/03 > 4/05 Ampicillin 4/05 >> Rocephin 4/05 >>  SIGNIFICANT EVENTS: 3/18 Admit 3/21 start CRRT 3/27 DC cardioversion 3/29 To SDU 3/31 Fever 4/06 Failed repeat DC cardioversion 4/08 Transition off CRRT 4/12 New ICH  4/13 Intubated  4/16 Progressive thrombocytopenia >> hematology consulted  LINES/TUBES: Rt IJ HD cath 3/31 >> Lt IJ CVL 4/02 > 4/12 Lft IJ CVC  4/13 >> Right Femoral Aline 4/14 >> ETT 4/13 >>   DISCUSSION: 61 yo female admitted with CHF exacerbation complicated by renal failure and VRE bacteremia, on 4/12 found to have new ICH after cardioversion  ASSESSMENT / PLAN:  PULMONARY A: Acute on Chronic Hypoxic/Hypercarbic resp failure  H/O COPD, OSA/OHS P:   Vent Support Wean as tolerated > failed 4/17 due to periods of apnea, plans to try this AM  Maintain Oxygenation >90 Pulmonary Hygiene   CARDIOVASCULAR A:  Acute on Chronic Combined CHF Cardiogenic Shock  Afib S/P cardioversion 4/12 H/O HTN P:  Cardiac Monitoring  Per Heart Failure  Wean Levophed to Maintain MAP >65  Continue midodrine, amiodarone  Hold coumadin/heparin in setting of ICH  RENAL A:   Hypokalemia  Acute on Chronic renal failure with cardiorenal syndrome  P:   Neprology following  Continue CRRT  Trend BMP Replace electrolyes as needed   GASTROINTESTINAL A:   Dysphagia  P:   Will need speech therapy evaluation after extubation  NPO PPI   HEMATOLOGIC A:   ICH Thrombocytopenia ?DIC -?HIT > heparin induced Ab  elevated 01/23/17 - last dose of coumadin 4/11, and heparin 4/12 P:  Hold anticoagulation  Follow up on SR assay to confirm/rule out HIT > still processing as of 4/18  Transfuse Plts less then 20,000 Trend Coag Studies  SCDS   INFECTIOUS A:   VRE Bacteremia  P:   Continue Antibiotics until 4/18 per ID 2 weeks after course completed will need blood cultures  Trend WBC and Fever curve   ENDOCRINE A:   Hyperglycemia   P:   Trend Glucose  SSI   NEUROLOGIC A:   Left Frontal ICH  Acute Encephalopathy - Improving  Deconditioning  P:   RASS goal: 0 Monitor  Continue Foot drop boot  Wean Fentanyl gtt to maintain RASS     FAMILY  - Updates: No family at bedside   - Inter-disciplinary family meet or Palliative Care meeting due by: ongoing    CC Time : 43 minutes   Hayden Pedro, [place]  Pgr: [phone]  PCCM Pgr: [phone]

## 2017-01-27 ENCOUNTER — Inpatient Hospital Stay (HOSPITAL_COMMUNITY): Payer: Medicare Other

## 2017-01-27 DIAGNOSIS — I610 Nontraumatic intracerebral hemorrhage in hemisphere, subcortical: Secondary | ICD-10-CM

## 2017-01-27 LAB — COOXEMETRY PANEL
CARBOXYHEMOGLOBIN: 3.8 % — AB (ref 0.5–1.5)
METHEMOGLOBIN: 1.1 % (ref 0.0–1.5)
O2 Saturation: 59.3 %
Total hemoglobin: 8.1 g/dL — ABNORMAL LOW (ref 12.0–16.0)

## 2017-01-27 LAB — GLUCOSE, CAPILLARY
GLUCOSE-CAPILLARY: 156 mg/dL — AB (ref 65–99)
GLUCOSE-CAPILLARY: 164 mg/dL — AB (ref 65–99)
GLUCOSE-CAPILLARY: 192 mg/dL — AB (ref 65–99)
GLUCOSE-CAPILLARY: 86 mg/dL (ref 65–99)
Glucose-Capillary: 134 mg/dL — ABNORMAL HIGH (ref 65–99)
Glucose-Capillary: 161 mg/dL — ABNORMAL HIGH (ref 65–99)
Glucose-Capillary: 168 mg/dL — ABNORMAL HIGH (ref 65–99)
Glucose-Capillary: 184 mg/dL — ABNORMAL HIGH (ref 65–99)

## 2017-01-27 LAB — RENAL FUNCTION PANEL
ALBUMIN: 1.6 g/dL — AB (ref 3.5–5.0)
ANION GAP: 13 (ref 5–15)
ANION GAP: 9 (ref 5–15)
Albumin: 2 g/dL — ABNORMAL LOW (ref 3.5–5.0)
BUN: 24 mg/dL — AB (ref 6–20)
BUN: 29 mg/dL — ABNORMAL HIGH (ref 6–20)
CALCIUM: 7 mg/dL — AB (ref 8.9–10.3)
CO2: 25 mmol/L (ref 22–32)
CO2: 24 mmol/L (ref 22–32)
Calcium: 8.2 mg/dL — ABNORMAL LOW (ref 8.9–10.3)
Chloride: 99 mmol/L — ABNORMAL LOW (ref 101–111)
Chloride: 106 mmol/L (ref 101–111)
Creatinine, Ser: 1.29 mg/dL — ABNORMAL HIGH (ref 0.44–1.00)
Creatinine, Ser: 1.42 mg/dL — ABNORMAL HIGH (ref 0.44–1.00)
GFR calc non Af Amer: 45 mL/min — ABNORMAL LOW (ref >60)
GFR, EST AFRICAN AMERICAN: 52 mL/min — AB (ref >60)
GFR, EST AFRICAN AMERICAN: 46 mL/min — AB (ref >60)
GFR, EST NON AFRICAN AMERICAN: 40 mL/min — AB (ref >60)
Glucose, Bld: 160 mg/dL — ABNORMAL HIGH (ref 65–99)
Glucose, Bld: 149 mg/dL — ABNORMAL HIGH (ref 65–99)
PHOSPHORUS: 1.5 mg/dL — AB (ref 2.5–4.6)
POTASSIUM: 3.7 mmol/L (ref 3.5–5.1)
POTASSIUM: 3.2 mmol/L — AB (ref 3.5–5.1)
SODIUM: 139 mmol/L (ref 135–145)
Sodium: 137 mmol/L (ref 135–145)

## 2017-01-27 LAB — CBC WITH DIFFERENTIAL/PLATELET
BASOS ABS: 0.2 10*3/uL — AB (ref 0.0–0.1)
Basophils Relative: 1 %
EOS PCT: 1 %
Eosinophils Absolute: 0.2 10*3/uL (ref 0.0–0.7)
HCT: 28.2 % — ABNORMAL LOW (ref 36.0–46.0)
Hemoglobin: 8.5 g/dL — ABNORMAL LOW (ref 12.0–15.0)
LYMPHS ABS: 2.6 10*3/uL (ref 0.7–4.0)
LYMPHS PCT: 17 %
MCH: 26.6 pg (ref 26.0–34.0)
MCHC: 30.1 g/dL (ref 30.0–36.0)
MCV: 88.4 fL (ref 78.0–100.0)
MONOS PCT: 4 %
Monocytes Absolute: 0.6 10*3/uL (ref 0.1–1.0)
NEUTROS PCT: 77 %
Neutro Abs: 11.7 10*3/uL — ABNORMAL HIGH (ref 1.7–7.7)
PLATELETS: 38 10*3/uL — AB (ref 150–400)
RBC: 3.19 MIL/uL — ABNORMAL LOW (ref 3.87–5.11)
RDW: 30 % — ABNORMAL HIGH (ref 11.5–15.5)
WBC MORPHOLOGY: INCREASED
WBC: 15.3 10*3/uL — AB (ref 4.0–10.5)

## 2017-01-27 LAB — SEROTONIN RELEASE ASSAY (SRA): SRA .2 IU/mL UFH Ser-aCnc: <1 % (ref 0–20)

## 2017-01-27 LAB — CALCIUM, IONIZED: Calcium, Ionized, Serum: 4.2 mg/dL — ABNORMAL LOW (ref 4.5–5.6)

## 2017-01-27 LAB — C DIFFICILE QUICK SCREEN W PCR REFLEX
C DIFFICILE (CDIFF) TOXIN: NEGATIVE
C DIFFICLE (CDIFF) ANTIGEN: NEGATIVE
C Diff interpretation: NOT DETECTED

## 2017-01-27 LAB — MAGNESIUM: Magnesium: 1.9 mg/dL (ref 1.7–2.4)

## 2017-01-27 MED ORDER — VITAL AF 1.2 CAL PO LIQD
1000.0000 mL | ORAL | Status: DC
  Administered 2017-01-27: 15:00:00
  Administered 2017-01-29 – 2017-01-31 (×4): 1000 mL

## 2017-01-27 MED ORDER — POTASSIUM CHLORIDE 20 MEQ/15ML (10%) PO SOLN
30.0000 meq | ORAL | Status: AC
  Administered 2017-01-27 (×2): 30 meq
  Filled 2017-01-27 (×2): qty 30

## 2017-01-27 MED ORDER — CHLORHEXIDINE GLUCONATE 0.12 % MT SOLN
OROMUCOSAL | Status: AC
  Administered 2017-01-27: 10:00:00
  Filled 2017-01-27: qty 15

## 2017-01-27 MED ORDER — SODIUM PHOSPHATES 45 MMOLE/15ML IV SOLN
30.0000 mmol | Freq: Once | INTRAVENOUS | Status: AC
  Administered 2017-01-27: 30 mmol via INTRAVENOUS
  Filled 2017-01-27: qty 10

## 2017-01-27 NOTE — Evaluation (Signed)
Clinical/Bedside Swallow Evaluation Patient Details  Name: Paige Marshall MRN: [id number] Date of Birth: [birth year]  Today's Date: 01/27/2017 Time: SLP Start Time (ACUTE ONLY): 0906 SLP Stop Time (ACUTE ONLY): 0921 SLP Time Calculation (min) (ACUTE ONLY): 15 min  Past Medical History:  Past Medical History:  Diagnosis Date  . Asthma   . Chronic combined systolic and diastolic CHF (congestive heart failure) ([place])   . CKD (chronic kidney disease), stage III   . COPD (chronic obstructive pulmonary disease) ([place])   . Degenerative joint disease of both lower legs   . Former smoker   . Gout    hx in "both knees" (01/14/2015)  . H. pylori infection 01/03/2014   +breath test.    . High cholesterol   . Hypertension   . Morbid obesity ([place])   . NICM (nonischemic cardiomyopathy) ([place])    a. LHC 01/2015: normal cors, EF 45-50%. b. EF 50-55% in 08/2016. c. EF 33% 01/2017  . OSA (obstructive sleep apnea)   . Persistent atrial fibrillation ([place])   . Pneumonia X 2  . Pulmonary hypertension ([place])   . Rheumatoid arthritis ([place])   . Stroke [facility])    Past Surgical History:  Past Surgical History:  Procedure Laterality Date  . ANGIOGRAM/LV (CONGENITAL)  2007  . BREATH TEK H PYLORI N/A 12/31/2013   Procedure: BREATH TEK H PYLORI;  Surgeon: Gayland Curry, MD;  Location: [facility];  Service: General;  Laterality: N/A;  . CARDIOVERSION N/A 01/13/2017   Procedure: CARDIOVERSION;  Surgeon: Jolaine Artist, MD;  Location: [facility] ENDOSCOPY;  Service: Cardiovascular;  Laterality: N/A;  . CARDIOVERSION N/A 01/20/2017   Procedure: CARDIOVERSION;  Surgeon: Larey Dresser, MD;  Location: [facility];  Service: Cardiovascular;  Laterality: N/A;  . CORNEAL TRANSPLANT Right ~ 2010  . CORONARY ANGIOPLASTY WITH STENT PLACEMENT  11/04/2005   "1"  . DOPPLER ECHOCARDIOGRAPHY     2 D   EF of 45%  . EYE SURGERY    . IR GENERIC HISTORICAL  01/06/2017   IR FLUORO GUIDE CV LINE RIGHT 01/06/2017 Corrie Mckusick, DO  [facility]  . IR GENERIC HISTORICAL  01/06/2017   IR US GUIDE VASC ACCESS RIGHT 01/06/2017 Corrie Mckusick, DO [facility]  . LEFT HEART CATHETERIZATION WITH CORONARY ANGIOGRAM N/A 01/20/2015   Procedure: LEFT HEART CATHETERIZATION WITH CORONARY ANGIOGRAM;  Surgeon: Lorretta Harp, MD;  Location: [facility] C[facility] LAB;  Service: Cardiovascular;  Laterality: N/A;  . RIGHT HEART CATH N/A 01/07/2017   Procedure: Right Heart Cath;  Surgeon: Jolaine Artist, MD;  Location: [facility] CV LAB;  Service: Cardiovascular;  Laterality: N/A;  . sleep study  2011  . stress myocardial dipyridamole perfusion    . TEE WITHOUT CARDIOVERSION N/A 01/13/2017   Procedure: TRANSESOPHAGEAL ECHOCARDIOGRAM (TEE);  Surgeon: Jolaine Artist, MD;  Location: [facility];  Service: Cardiovascular;  Laterality: N/A;  . [place]  . venous duplex ultrasound  20164   HPI:  61 year old woman with poorly controlled OHS, chronic combined systolic and diastolic CHF, a-fib/a-flutter on chronic anticoagulation, hypertension, COPD, CKD Stage 3. Admitted to ED from [place] on 3/18 foracute decompensated CHF complicated by cardiorenal syndrome. During stay developed acute on chronic kidney disease requiring CRRT, now being followed by nephrology for PRN HD. Blood culture positive for VRE bacteremia being treated with ampicillin and ceftriaxone. Transferred to step-down on 4/11. On 4/12 noted to be more lethargic and aphasic. CT Head revealed mixed density mass like area along  the surface of the anterior frontal lobe with cytotoxic edema as well as a rounded 2 cm hemorrhage.ETT 4/13-4/18.    Assessment / Plan / Recommendation Clinical Impression  Patient presents with subtle s/s of aspiration at bedside characterized by intermittent delayed post swallow, otherwise with a seemingly intact oropharyngeal swallow. Patient however with [place] and 5 day intubation increasing risk for silent aspiration. Recommend FEES to evaluate swallowing  function and determine least restrictive diet.  SLP Visit Diagnosis: Dysphagia, unspecified (R13.10)       Diet Recommendation NPO   Medication Administration: Via alternative means    Other  Recommendations Oral Care Recommendations: Oral care QID             Prognosis Prognosis for Safe Diet Advancement: Good      Swallow Study   General HPI: 61 year old woman with poorly controlled OHS, chronic combined systolic and diastolic CHF, a-fib/a-flutter on chronic anticoagulation, hypertension, COPD, CKD Stage 3. Admitted to ED from [place] on 3/18 foracute decompensated CHF complicated by cardiorenal syndrome. During stay developed acute on chronic kidney disease requiring CRRT, now being followed by nephrology for PRN HD. Blood culture positive for VRE bacteremia being treated with ampicillin and ceftriaxone. Transferred to step-down on 4/11. On 4/12 noted to be more lethargic and aphasic. CT Head revealed mixed density mass like area along the surface of the anterior frontal lobe with cytotoxic edema as well as a rounded 2 cm hemorrhage.ETT 4/13-4/18.  Type of Study: Bedside Swallow Evaluation Previous Swallow Assessment: none noted Diet Prior to this Study: NPO Temperature Spikes Noted: No Respiratory Status: Nasal cannula History of Recent Intubation: Yes Length of Intubations (days): 5 days Date extubated: 01/26/17 Behavior/Cognition: Alert;Cooperative;Pleasant mood Oral Cavity Assessment: Within Functional Limits Oral Care Completed by SLP: Recent completion by staff Oral Cavity - Dentition: Adequate natural dentition Vision: Functional for self-feeding Self-Feeding Abilities: Able to feed self Patient Positioning: Upright in bed Baseline Vocal Quality: Wet (intermittent) Volitional Cough: Congested;Strong Volitional Swallow: Able to elicit    Oral/Motor/Sensory Function Overall Oral Motor/Sensory Function: Generalized oral weakness   Ice Chips Ice chips: Within functional  limits Presentation: Spoon   Thin Liquid Thin Liquid: Impaired Presentation: Cup;Self Fed;Straw Pharyngeal  Phase Impairments: Cough - Delayed    Nectar Thick Nectar Thick Liquid: Not tested   Honey Thick Honey Thick Liquid: Not tested   Puree Puree: Within functional limits Presentation: Spoon   Solid   GO   Solid: Not tested       Gabriel Rainwater MA, CCC-SLP [phone]  Paige Marshall 01/27/2017,9:55 AM

## 2017-01-27 NOTE — Progress Notes (Signed)
Advanced Heart Failure Rounding Note  PCP: Juluis Mire, NP  Primary Cardiologist: Dr. Gwenlyn Found   Subjective:    s/p failed DCCV 3/28 and 4/5.   01/20/17 - successful DCCV to NSR, lethargic and with garbled speech. CT head showed left frontal intracranial hemorrhage.   01/21/17 MRI head with small left frontal intracerebral hemorrhage with parafalcine small SDH, trace rightward midline shift. No CVA. Intubated for airway control.   CVVHD stopped 01/16/17, restarted on 4/14.  Went back into Afib 4/16. Citrate stopped 01/26/17 with risk of toxicity. Remains on CVVHD without anticoagulation.   Extubated yesterday. Remains on CVVH but keeping even. Pressor requirements increasing now on NE 42 and vasopressin. Awake and conversant. Denies SOB or fever. CVP 17   Objective:   Weight Range: 124 kg (273 lb 5.9 oz) Body mass index is 41.57 kg/m.   Vital Signs:   Temp:  [97.5 F (36.4 C)-97.9 F (36.6 C)] 97.5 F (36.4 C) (04/19 0400) Pulse Rate:  [25-140] 80 (04/19 0530) Resp:  [10-27] 20 (04/19 0715) BP: (71-103)/(35-75) 75/49 (04/18 2000) SpO2:  [58 %-100 %] 100 % (04/19 0600) Arterial Line BP: (92-152)/(33-63) 152/62 (04/19 0715) Last BM Date: 01/26/17  Weight change: Filed Weights   01/24/17 0200 01/25/17 0500 01/26/17 0500  Weight: 131 kg (288 lb 12.8 oz) 127 kg (279 lb 15.8 oz) 124 kg (273 lb 5.9 oz)    Intake/Output:   Intake/Output Summary (Last 24 hours) at 01/27/17 0905 Last data filed at 01/27/17 0800  Gross per 24 hour  Intake          2433.68 ml  Output             3141 ml  Net          -707.32 ml     Physical Exam: General: Extubated. Lying in bed. NAD HEENT: Normocephalic anicteric Neck: supple.Hard to assess JVP, LIJ TLC Carotids 2+ bilat; no bruits. No lymphadenopathy or thyromegaly appreciated. Cor: IRR. Tachy 2/6 SEM LSB Lungs: clear anteriorly Abdomen: obese/ soft NT/ND  + bowel sounds  Extremities: Cool. Mild edema R foot drop with boot  Neuro:  awake. Alert & oriented x3 moves all 4. R foot drop    Telemetry: Afib rates in the 111-130's.  Personally reviewed      Labs: CBC  Recent Labs  01/26/17 0419 01/26/17 1210 01/27/17 0426  WBC 10.6*  --  15.3*  NEUTROABS 8.4*  --  11.7*  HGB 9.0* 10.5* 8.5*  HCT 30.0* 31.0* 28.2*  MCV 88.8  --  88.4  PLT 45*  --  38*   Basic Metabolic Panel  Recent Labs  01/25/17 0413  01/25/17 1638  01/26/17 1600 01/27/17 0426  NA 138  < >  --   < > 138 137  K 3.5  < >  --   < > 3.7 3.7  CL 89*  < >  --   < > 94* 99*  CO2 31  < >  --   < > 27 25  GLUCOSE 146*  < >  --   < > 114* 160*  BUN 25*  < >  --   < > 25* 24*  CREATININE 2.70*  < >  --   < > 1.70* 1.29*  CALCIUM 8.7*  < >  --   < > 8.5* 8.2*  MG 1.8  --  1.7  --   --   --   PHOS 1.8*  < > 1.8*  < >  2.5 1.5*  < > = values in this interval not displayed. Liver Function Tests  Recent Labs  01/26/17 1600 01/27/17 0426  ALBUMIN 1.8* 2.0*    BNP: BNP (last 3 results)  Recent Labs  09/18/16 1126 11/07/16 1633 12/26/16 1643  BNP 991.0* 490.4* 1,116.0*    D-Dimer No results for input(s): DDIMER in the last 72 hours. Transthoracic Echocardiography 01/11/17 Study Conclusions  - Left ventricle: The cavity size was mildly dilated. Wall   thickness was increased in a pattern of mild LVH. Left   ventricular geometry showed evidence of eccentric hypertrophy.   Systolic function was moderately reduced. The estimated ejection   fraction was in the range of 35% to 40%. Moderate diffuse   hypokinesis with no identifiable regional variations. Acoustic   contrast opacification revealed no evidence ofthrombus. - Ventricular septum: The contour showed diastolic flattening.   These changes are consistent with RV volume overload. - Aortic valve: There was mild to moderate regurgitation directed   centrally in the LVOT. - Mitral valve: There was mild to moderate regurgitation directed   eccentrically and posteriorly. - Left  atrium: The atrium was moderately dilated. - Right ventricle: The cavity size was severely dilated. Wall   thickness was normal. Systolic function was mildly reduced. - Right atrium: The atrium was severely dilated. - Tricuspid valve: There was malcoaptation of the valve leaflets.   There was moderate-severe regurgitation. - Pulmonary arteries: Systolic pressure was severely increased. PA   peak pressure: 71 mm Hg (S). - Pericardium, extracardiac: A trivial pericardial effusion was   identified posterior to the heart.    Medications:     Scheduled Medications: . chlorhexidine gluconate (MEDLINE KIT)  15 mL Mouth Rinse BID  . Chlorhexidine Gluconate Cloth  6 each Topical Daily  . feeding supplement (PRO-STAT SUGAR FREE 64)  60 mL Oral BID  . hydrocortisone sod succinate (SOLU-CORTEF) inj  50 mg Intravenous Q6H  . insulin aspart  2-6 Units Subcutaneous Q4H  . mouth rinse  15 mL Mouth Rinse 10 times per day  . midodrine  10 mg Oral TID WC  . pantoprazole sodium  40 mg Per Tube Q24H    Infusions: . sodium chloride Stopped (01/24/17 0330)  . amiodarone 30 mg/hr (01/27/17 0700)  . feeding supplement (VITAL HIGH PROTEIN) 1,000 mL (01/27/17 0619)  . norepinephrine (LEVOPHED) Adult infusion 42 mcg/min (01/27/17 0700)  . dialysis replacement fluid (prismasate) 750 mL/hr at 01/27/17 0114  . dialysis replacement fluid (prismasate) 500 mL/hr at 01/26/17 2118  . dialysate (PRISMASATE) 2,000 mL/hr at 01/27/17 0259  . sodium citrate 2 %/dextrose 2.5% solution 3000 mL 250 mL/hr at 01/26/17 0210  . vasopressin (PITRESSIN) infusion - *FOR SHOCK* 0.03 Units/min (01/27/17 0700)    PRN Medications: Place/Maintain arterial line **AND** sodium chloride, acetaminophen (TYLENOL) oral liquid 160 mg/5 mL, diphenhydrAMINE-zinc acetate, Gerhardt's butt cream, senna-docusate   Assessment/Plan   1. Acute on chronic combined systolic and diastolic CHF: EF 68-12% on TEE 01/13/17 (down from 55% in March  2018 when first admitted). Likely tachy mediated from atrial fibrillation.  - Continues to require high-dose pressors with escalating needs. Also on midodrine.  - I am growing increasingly concerned that she will never be able to tolerate iHD.  - Continue pressors. Check. Co-ox and BCX 2. Left frontal ICD with parafalcine SDH: Trace right midline shift. No CVA. Per Neuro her small ICH/SDH does not explain the degree of her AMS. Likely a component of metabolic encephalopathy due to renal failure. -  alert and oriented today. Still with R foot drop.  - hold AC - need PT to see 3. PAF: Failed DCCV 3/27, 4/5. Had DCCV with return to NSR on 01/20/17, went back into Afib 01/24/17.  - No anticoagulation with SDH.  - Remains fast. Continue amiodarone gtt. Not candidate for repeat DC-CV off AC 4. Acute on chronic kidney disease: CVVHD restarted on 4/14. Citrate stopped today, will remain on CVVHD  - Requiring high dose pressors. Continue CVVHD for now.  - CVP climbing may need to trya nd pull 50/hr as toelrated 5. Obesity hypoventilation syndrome 6. Septic shock: Blood cultures with VRE.  - On ampicillin, rocephin.  - BP low. WBC back up.  Recheck cultures 7. 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  - No change  8. Acute respiratory failure: - Now extubated. CCM managing  9. Anemia: Hgb stable.  10. NSVT: Quiescent, stable.  - Continue Amio.  11. Thrombocytopenia: Platelets 38k today.  - No active bleeding   Although extubated she remains critically ill with multi-system organ failure and now progressive pressor requirements. I am very concerned that she will never be able to tolerate iHD. Will redraw cultures. [place] discussions initiated with her husband.   The patient is critically ill with multiple organ systems failure and requires high complexity decision making for assessment and support, frequent evaluation and titration of therapies, application of advanced monitoring  technologies and extensive interpretation of multiple databases.   Critical Care Time devoted to patient care services described in this note is 45 Minutes. This time reflects time of care of this signee, Dr. Pierre Bali who personally examined the patient and determined the plan of care. This critical care time does not reflect procedure time, teaching time or supervisory time of our PA/NP/resident but could involve care discussion and coordination time.   Length of Stay: 32   Glori Bickers, MD  01/27/2017, 9:05 AM Advanced Heart Failure Team  Pager [phone] M-F 7am-4pm.  Please contact [facility] for night-coverage after hours (4p -7a ) and weekends on [email or web address]

## 2017-01-27 NOTE — Progress Notes (Addendum)
Nutrition Follow Up  DOCUMENTATION CODES:   Morbid obesity  INTERVENTION:   D/C Vital High Protein and Prostat liquid protein   Initiate Vital AF 1.2 at goal rate of 70 ml/h (1680 ml per day)   TF regimen to provide 2016 kcals, 126 gm protein, 1362 ml of free water  NUTRITION DIAGNOSIS:   Inadequate oral intake now related to moderate-severe dysphagia as evidenced by NPO status, ongoing  GOAL:   Patient will meet greater than or equal to 90% of their needs, met   MONITOR:   TF tolerance, Diet advancement, Labs, Weight trends, I & O's  ASSESSMENT:   61 year old woman with poorly controlled OHS, chronic combined systolic and diastolic CHF, a-fib/a-flutter on chronic anticoagulation, hypertension, COPD, CKD Stage 3. Admitted to ED from [facility] on 3/18 for  acute decompensated CHF complicated by cardiorenal syndrome. During stay developed acute on chronic kidney disease requiring CRRT, now being followed by nephrology for PRN HD. Blood culture positive for VRE bacteremia being treated with ampicillin and ceftriaxone. Transferred to step-down on 4/11. On 4/12 noted to be more lethargic and aphasic. CT Head revealed mixed density mass like area along the surface of the anterior frontal lobe with cytotoxic edema as well as a rounded 2 cm hemorrhage.   Pt developed altered mental status and aphasia 4/12.  Transferred from 3W-CPCU to 2H-Cardiovascular ICU.  Extubated 4/18.   S/p FEES today.  Speech Path recommending continued NPO status. Vital High Protein infusing at goal rate of 50 ml/hr via CORTRAK small bore feeding tube (tip at ligament of Treitz). Nephrology following for acute oliguric kidney injury.  Continues on CRRT. Labs and medications reviewed. CBG's [phone].  Diet Order:  Diet NPO time specified  Skin:  Reviewed, no issues  Last BM:  4/18  Height:   Ht Readings from Last 1 Encounters:  01/21/17 5' 8"  (1.727 m)   Weight:   Wt Readings from Last 1 Encounters:   01/26/17 273 lb 5.9 oz (124 kg)   Ideal Body Weight:  63.6 kg  BMI:  Body mass index is 41.57 kg/m.  Estimated Nutritional Needs:   Kcal:  1900-2100  Protein:  120-130 gm  Fluid:  per MD  EDUCATION NEEDS:   No education needs identified at this time  Arthur Holms, RD, LDN Pager #: [phone] After-Hours Pager #: [phone]

## 2017-01-27 NOTE — Progress Notes (Signed)
[facility] Progress Note    Assessment/ Plan:   1. Acute oliguric kidney injury: remains anuric.   We will continue CRRT; CVPs are up and need to try to pull some fluid.  Will try to maximize pre-blood pump fluids and dialysate to reduce clotting in filter 2. Afib with RVR: on amio gtt at present 3.  Shock 2/2 VRE bacteremia: pressor requirements increasing, leukocytosis rising, off abx now, need to reculture 4.  Acute on chronic hypoxic respiratory failure: extubated 4/18 5.  SDH: AAO x 3  Subjective:    Extubated but pressor requirement up.  Filter clotting overnight, can't use citrate due to concerns previously about developing citrate toxicity, and plts are 32 making heparin a poor choice.   Objective:   BP (!) 75/49   Pulse (!) 121   Temp 97.5 F (36.4 C) (Oral)   Resp 20   Ht 5' 8" (1.727 m)   Wt 124 kg (273 lb 5.9 oz)   LMP 09/07/2011   SpO2 99%   BMI 41.57 kg/m   Intake/Output Summary (Last 24 hours) at 01/27/17 1214 Last data filed at 01/27/17 1100  Gross per 24 hour  Intake          2683.18 ml  Output             2723 ml  Net           -39.82 ml   Weight change:   Physical Exam: IWP:YKDX open, looking around CVS: tachy, irregular Resp:coarse bilaterally Abd: obese Ext: no edema ACCESS: R TDC in place, dressing c/d/intact.  Imaging: Dg Chest Port 1 View  Result Date: 01/27/2017 CLINICAL DATA:  Extubation. EXAM: PORTABLE CHEST 1 VIEW COMPARISON:  01/26/2017. FINDINGS: Interim extubation and removal of NG tube. Dual-lumen right IJ line and left IJ line stable position. Cardiomegaly with pulmonary vascular prominence and bilateral interstitial prominence consistent with CHF. Pneumonitis cannot be excluded . Interim slight improvement of left base aeration. No pneumothorax. IMPRESSION: 1. Interim extubation and removal of NG tube. Dual-lumen right IJ line left IJ line stable position. 2. Persistent cardiomegaly, pulmonary venous congestion,  bilateral interstitial prominence suggesting CHF. No change. 3. Interim improved aeration left lung base . Electronically Signed   By: Marcello Moores  Register   On: 01/27/2017 07:38   Dg Chest Port 1 View  Result Date: 01/26/2017 CLINICAL DATA:  Intubated patient, respiratory failure. EXAM: PORTABLE CHEST 1 VIEW COMPARISON:  Portable chest x-ray of January 24, 2017 FINDINGS: The lungs are reasonably well inflated. Patchy interstitial and alveolar opacities persist bilaterally. The retrocardiac region on the left remains dense but slight improved aeration is noted here today. The cardiac silhouette remains enlarged. The central pulmonary vascularity remains engorged. A small left pleural effusion is suspected. The endotracheal tube tip lies 2.5 cm above the carina. The feeding tube tip projects below the inferior margin of the image. The dual-lumen dialysis catheter tip projects over the distal third of the SVC. The left internal jugular venous catheter tip projects over the proximal SVC. IMPRESSION: Slight interval improvement in the appearance of the lungs consistent with resolving edema or pneumonia or combination of the two. Persistent left lower lobe atelectasis or pneumonia with probable small left pleural effusion. Electronically Signed   By: David  Martinique M.D.   On: 01/26/2017 07:57   Dg Abd Portable 1v  Result Date: 01/26/2017 CLINICAL DATA:  Feeding tube placement. EXAM: PORTABLE ABDOMEN - 1 VIEW COMPARISON:  01/25/2017 FINDINGS: Feeding tube is unchanged in  position with tip again at the expected level of the ligament of Treitz. No dilated loops of bowel are seen in the included left upper abdomen. Bibasilar lung base opacities are partially visualized, more fully evaluated on chest radiograph earlier today. No acute osseous abnormality is seen. IMPRESSION: Unchanged feeding tube with tip of the level of the ligament of Treitz. Electronically Signed   By: Logan Bores M.D.   On: 01/26/2017 10:22     Labs: BMET  Recent Labs Lab 01/24/17 0347  01/24/17 1502  01/25/17 0413  01/25/17 1600 01/25/17 1638  01/26/17 0205 01/26/17 0408 01/26/17 0413 01/26/17 0419 01/26/17 1210 01/26/17 1600 01/27/17 0426  NA 137  < > 138  < > 138  < > 140  --   < > 137 135 137 138 136 138 137  K 4.2  < > 4.2  < > 3.5  < > 3.2*  --   < > 3.0* 2.8* 2.9* 2.8* 3.4* 3.7 3.7  CL 101  < > 97*  < > 89*  < > 87*  --   < > 82* 85* 82* 86* 90* 94* 99*  CO2 22  --  25  --  31  --  34*  --   --   --   --   --  34*  --  27 25  GLUCOSE 108*  < > 131*  < > 146*  < > 137*  --   < > 183* 152* 202* 150* 98 114* 160*  BUN 24*  < > 24*  < > 25*  < > 27*  --   < > 27* 30* 27* 28* 28* 25* 24*  CREATININE 3.63*  < > 3.17*  < > 2.70*  < > 2.45*  --   < > 1.80* 2.00* 1.70* 2.25* 1.70* 1.70* 1.29*  CALCIUM 8.3*  --  8.9  --  8.7*  --  9.2  --   --   --   --   --  9.2  --  8.5* 8.2*  PHOS 2.9  < > 2.3*  2.3*  --  1.8*  --  1.8* 1.8*  --   --   --   --  1.7*  --  2.5 1.5*  < > = values in this interval not displayed. CBC  Recent Labs Lab 01/24/17 0347  01/25/17 0413  01/26/17 0413 01/26/17 0419 01/26/17 1210 01/27/17 0426  WBC 6.7  --  7.9  --   --  10.6*  --  15.3*  NEUTROABS  --   --  5.9  --   --  8.4*  --  11.7*  HGB 9.3*  < > 9.4*  < > 11.2* 9.0* 10.5* 8.5*  HCT 29.8*  < > 30.6*  < > 33.0* 30.0* 31.0* 28.2*  MCV 88.7  --  88.7  --   --  88.8  --  88.4  PLT 39*  --  44*  --   --  45*  --  38*  < > = values in this interval not displayed.  Medications:    . chlorhexidine gluconate (MEDLINE KIT)  15 mL Mouth Rinse BID  . Chlorhexidine Gluconate Cloth  6 each Topical Daily  . feeding supplement (PRO-STAT SUGAR FREE 64)  60 mL Oral BID  . hydrocortisone sod succinate (SOLU-CORTEF) inj  50 mg Intravenous Q6H  . insulin aspart  2-6 Units Subcutaneous Q4H  . mouth rinse  15 mL Mouth Rinse 10  times per day  . midodrine  10 mg Oral TID WC  . pantoprazole sodium  40 mg Per Tube Q24H      Elizabeth Upton,  MD 01/27/2017, 12:14 PM  

## 2017-01-27 NOTE — Progress Notes (Signed)
Patient refused BIPAP use this night, stating she will continue to wear her oxygen. Patient is not showing signs of distress at this time and RT will continue to monitor as needed.

## 2017-01-27 NOTE — Procedures (Signed)
Objective Swallowing Evaluation: Type of Study: FEES-Fiberoptic Endoscopic Evaluation of Swallow  Patient Details  Name: Paige Marshall MRN: [id number] Date of Birth: July 09, 1956  Today's Date: 01/27/2017 Time: SLP Start Time (ACUTE ONLY): 1015-SLP Stop Time (ACUTE ONLY): 1043 SLP Time Calculation (min) (ACUTE ONLY): 28 min  Past Medical History:  Past Medical History:  Diagnosis Date  . Asthma   . Chronic combined systolic and diastolic CHF (congestive heart failure) ([place])   . CKD (chronic kidney disease), stage III   . COPD (chronic obstructive pulmonary disease) ([place])   . Degenerative joint disease of both lower legs   . Former smoker   . Gout    hx in "both knees" (01/14/2015)  . H. pylori infection 01/03/2014   +breath test.    . High cholesterol   . Hypertension   . Morbid obesity ([place])   . NICM (nonischemic cardiomyopathy) ([place])    a. LHC 01/2015: normal cors, EF 45-50%. b. EF 50-55% in 08/2016. c. EF 33% 01/2017  . OSA (obstructive sleep apnea)   . Persistent atrial fibrillation ([place])   . Pneumonia X 2  . Pulmonary hypertension ([place])   . Rheumatoid arthritis ([place])   . Stroke [facility])    Past Surgical History:  Past Surgical History:  Procedure Laterality Date  . ANGIOGRAM/LV (CONGENITAL)  2007  . BREATH TEK H PYLORI N/A 12/31/2013   Procedure: BREATH TEK H PYLORI;  Surgeon: Gayland Curry, MD;  Location: [facility];  Service: General;  Laterality: N/A;  . CARDIOVERSION N/A 01/13/2017   Procedure: CARDIOVERSION;  Surgeon: Jolaine Artist, MD;  Location: [facility] ENDOSCOPY;  Service: Cardiovascular;  Laterality: N/A;  . CARDIOVERSION N/A 01/20/2017   Procedure: CARDIOVERSION;  Surgeon: Larey Dresser, MD;  Location: [facility];  Service: Cardiovascular;  Laterality: N/A;  . CORNEAL TRANSPLANT Right ~ 2010  . CORONARY ANGIOPLASTY WITH STENT PLACEMENT  11/04/2005   "1"  . DOPPLER ECHOCARDIOGRAPHY     2 D   EF of 45%  . EYE SURGERY    . IR GENERIC HISTORICAL  01/06/2017   IR FLUORO GUIDE CV LINE RIGHT 01/06/2017 Corrie Mckusick, DO [facility]  . IR GENERIC HISTORICAL  01/06/2017   IR US GUIDE VASC ACCESS RIGHT 01/06/2017 Corrie Mckusick, DO [facility]  . LEFT HEART CATHETERIZATION WITH CORONARY ANGIOGRAM N/A 01/20/2015   Procedure: LEFT HEART CATHETERIZATION WITH CORONARY ANGIOGRAM;  Surgeon: Lorretta Harp, MD;  Location[facility] CATH LAB;  Service: Cardiovascular;  Laterality: N/A;  . RIGHT HEART CATH N/A 01/07/2017   Procedure: Right Heart Cath;  Surgeon: Jolaine Artist, MD;  Location: [facility] CV LAB;  Service: Cardiovascular;  Laterality: N/A;  . sleep study  2011  . stress myocardial dipyridamole perfusion    . TEE WITHOUT CARDIOVERSION N/A 01/13/2017   Procedure: TRANSESOPHAGEAL ECHOCARDIOGRAM (TEE);  Surgeon: Jolaine Artist, MD;  Location: [facility];  Service: Cardiovascular;  Laterality: N/A;  . [place]  . venous duplex ultrasound  6210   HPI: 61 year old woman with poorly controlled OHS, chronic combined systolic and diastolic CHF, a-fib/a-flutter on chronic anticoagulation, hypertension, COPD, CKD Stage 3. Admitted to ED from [facility] on 3/18 foracute decompensated CHF complicated by cardiorenal syndrome. During stay developed acute on chronic kidney disease requiring CRRT, now being followed by nephrology for PRN HD. Blood culture positive for VRE bacteremia being treated with ampicillin and ceftriaxone. Transferred to step-down on 4/11. On 4/12 noted to be more lethargic and aphasic. CT Head revealed  mixed density mass like area along the surface of the anterior frontal lobe with cytotoxic edema as well as a rounded 2 cm hemorrhage.ETT 4/13-4/18.   No Data Recorded   Assessment / Plan / Recommendation  CHL IP CLINICAL IMPRESSIONS 01/27/2017  Clinical Impression Patient presents with a moderate-severe oropharyngeal dysphagia characterized by decreased base of tongue retraction, laryngeal and pharyngeal strength s/p 5 day intubation and  acute ICH resulting if moderate post swallow residuals and decreased laryngeal closure. Thicker consistencies initially with intact airway protection however deep penetration consistently noted with second swallow in attempts to clear residue (ineffective). Thinner constencies penetrated both during and post swallow. Additonally, cued cough initially effective to clear the airway, becoming ineffective wtih fatigue as study progressed. Chin tuck attempted and ineffective. Results discussed with patient. Recommend NPO with ice chips PRN after oral care only. RE-evaluate with FEES on monday 4/23. Will f/u for therapeutic intervention. Recommend cognitive-linguistic evaluation given acute ICH with observed cognitive deficits. MD please order if agree.   SLP Visit Diagnosis Dysphagia, oropharyngeal phase (R13.12)  Attention and concentration deficit following --  Frontal lobe and executive function deficit following --  Impact on safety and function Severe aspiration risk;Moderate aspiration risk      CHL IP TREATMENT RECOMMENDATION 01/27/2017  Treatment Recommendations Therapy as outlined in treatment plan below     Prognosis 01/27/2017  Prognosis for Safe Diet Advancement Good  Barriers to Reach Goals Cognitive deficits  Barriers/Prognosis Comment --    CHL IP DIET RECOMMENDATION 01/27/2017  SLP Diet Recommendations NPO;Ice chips PRN after oral care  Liquid Administration via --  Medication Administration --  Compensations --  Postural Changes --      CHL IP OTHER RECOMMENDATIONS 01/27/2017  Recommended Consults --  Oral Care Recommendations Oral care QID  Other Recommendations --      CHL IP FOLLOW UP RECOMMENDATIONS 01/27/2017  Follow up Recommendations Inpatient Rehab      CHL IP FREQUENCY AND DURATION 01/27/2017  Speech Therapy Frequency (ACUTE ONLY) min 3x week  Treatment Duration 2 weeks           CHL IP ORAL PHASE 01/27/2017  Oral Phase WFL  Oral - Pudding Teaspoon --  Oral -  Pudding Cup --  Oral - Honey Teaspoon --  Oral - Honey Cup --  Oral - Nectar Teaspoon --  Oral - Nectar Cup --  Oral - Nectar Straw --  Oral - Thin Teaspoon --  Oral - Thin Cup --  Oral - Thin Straw --  Oral - Puree --  Oral - Mech Soft --  Oral - Regular --  Oral - Multi-Consistency --  Oral - Pill --  Oral Phase - Comment --    CHL IP PHARYNGEAL PHASE 01/27/2017  Pharyngeal Phase Impaired  Pharyngeal- Pudding Teaspoon --  Pharyngeal --  Pharyngeal- Pudding Cup --  Pharyngeal --  Pharyngeal- Honey Teaspoon Delayed swallow initiation-vallecula;Reduced airway/laryngeal closure;Reduced tongue base retraction;Reduced pharyngeal peristalsis;Penetration/Apiration after swallow;Pharyngeal residue - valleculae;Pharyngeal residue - pyriform  Pharyngeal Material enters airway, CONTACTS cords and not ejected out  Pharyngeal- Honey Cup Delayed swallow initiation-vallecula;Reduced airway/laryngeal closure;Reduced tongue base retraction;Reduced pharyngeal peristalsis;Penetration/Apiration after swallow;Pharyngeal residue - valleculae;Pharyngeal residue - pyriform  Pharyngeal Material enters airway, CONTACTS cords and not ejected out  Pharyngeal- Nectar Teaspoon Delayed swallow initiation-vallecula;Reduced airway/laryngeal closure;Reduced tongue base retraction;Reduced pharyngeal peristalsis;Penetration/Aspiration during swallow;Penetration/Apiration after swallow;Pharyngeal residue - valleculae;Pharyngeal residue - pyriform;Delayed swallow initiation-pyriform sinuses  Pharyngeal Material enters airway, CONTACTS cords and not ejected out  Pharyngeal- Nectar  Cup --  Pharyngeal --  Pharyngeal- Nectar Straw --  Pharyngeal --  Pharyngeal- Thin Teaspoon Delayed swallow initiation-vallecula;Reduced airway/laryngeal closure;Reduced tongue base retraction;Reduced pharyngeal peristalsis;Penetration/Aspiration during swallow;Penetration/Apiration after swallow;Pharyngeal residue - valleculae;Pharyngeal  residue - pyriform;Delayed swallow initiation-pyriform sinuses  Pharyngeal Material enters airway, CONTACTS cords and not ejected out  Pharyngeal- Thin Cup Delayed swallow initiation-vallecula;Reduced airway/laryngeal closure;Reduced tongue base retraction;Reduced pharyngeal peristalsis;Penetration/Aspiration during swallow;Penetration/Apiration after swallow;Pharyngeal residue - valleculae;Pharyngeal residue - pyriform;Delayed swallow initiation-pyriform sinuses  Pharyngeal Material enters airway, CONTACTS cords and not ejected out  Pharyngeal- Thin Straw --  Pharyngeal --  Pharyngeal- Puree Delayed swallow initiation-vallecula;Reduced airway/laryngeal closure;Reduced tongue base retraction;Reduced pharyngeal peristalsis;Penetration/Apiration after swallow;Pharyngeal residue - valleculae;Pharyngeal residue - pyriform  Pharyngeal Material enters airway, remains ABOVE vocal cords and not ejected out;Material enters airway, CONTACTS cords and not ejected out  Pharyngeal- Mechanical Soft --  Pharyngeal --  Pharyngeal- Regular --  Pharyngeal --  Pharyngeal- Multi-consistency --  Pharyngeal --  Pharyngeal- Pill --  Pharyngeal --  Pharyngeal Comment --    Gabriel Rainwater MA, CCC-SLP [phone]   Yann Biehn Meryl 01/27/2017, 10:54 AM

## 2017-01-27 NOTE — Progress Notes (Signed)
Patient placed on [employer] Precautions per NP order.  Specimen to be sent.  Patient, family and visitors informed of additional PPE precautions.  Enter[employer] sign placed outside door along with existing Contact sign.

## 2017-01-27 NOTE — Progress Notes (Signed)
PULMONARY / CRITICAL CARE MEDICINE   Name: Paige Marshall MRN: [id number] DOB: 1956/08/16    ADMISSION DATE:  12/26/2016 CONSULTATION DATE:  01/20/2017  REFERRING MD:  Dr. Evette Doffing   CHIEF COMPLAINT:  AMS  Brief:   61 year old woman with poorly controlled OHS, chronic combined systolic and diastolic CHF, a-fib/a-flutter on chronic anticoagulation, hypertension, COPD, CKD Stage 3. Admitted to ED from [facility] on 3/18 foracute decompensated CHF complicated by cardiorenal syndrome. During stay developed acute on chronic kidney disease requiring CRRT, now being followed by nephrology for PRN HD. Blood culture positive for VRE bacteremia being treated with ampicillin and ceftriaxone. Transferred to step-down on 4/11. On 4/12 noted to be more lethargic and aphasic. CT Head revealed mixed density mass like area along the surface of the anterior frontal lobe with cytotoxic edema as well as a rounded 2 cm hemorrhage.  SUBJECTIVE:  Extubated 4/18. Started on Vasopressin gtt and stress dose steroids. Remains on high dose levophed gtt.   VITAL SIGNS: BP (!) 75/49   Pulse (!) 118   Temp 97.5 F (36.4 C) (Oral)   Resp (!) 23   Ht 5\' 8"  (1.727 m)   Wt 124 kg (273 lb 5.9 oz)   LMP 09/07/2011   SpO2 98%   BMI 41.57 kg/m   HEMODYNAMICS: CVP:  [0 mmHg-19 mmHg] 17 mmHg  VENTILATOR SETTINGS:    INTAKE / OUTPUT: I/O last 3 completed shifts: In: 5535.4 [I.V.:3313.1; NG/GT:1658.3; IV Piggyback:564] Out: 3299 [MEQAS:3419; Stool:400]  PHYSICAL EXAMINATION: General:  Adult female, no distress  Neuro:  Alert, oriented,, moves all extremities   HEENT:  Normocephalic  Cardiovascular:  Tachy, Irregular, no MRG, NI S1/S2 Lungs:  Diminished breath sounds, non-labored  Abdomen:  obese, active bowel sounds  Musculoskeletal:  No acute  Skin:  Warm, dry, intact   LABS:  BMET  Recent Labs Lab 01/26/17 0419 01/26/17 1210 01/26/17 1600 01/27/17 0426  NA 138 136 138 137  K 2.8* 3.4* 3.7 3.7   CL 86* 90* 94* 99*  CO2 34*  --  27 25  BUN 28* 28* 25* 24*  CREATININE 2.25* 1.70* 1.70* 1.29*  GLUCOSE 150* 98 114* 160*    Electrolytes  Recent Labs Lab 01/24/17 1502 01/25/17 0413  01/25/17 1638 01/26/17 0419 01/26/17 1600 01/27/17 0426  CALCIUM 8.9 8.7*  < >  --  9.2 8.5* 8.2*  MG 2.1 1.8  --  1.7  --   --   --   PHOS 2.3*  2.3* 1.8*  < > 1.8* 1.7* 2.5 1.5*  < > = values in this interval not displayed.  CBC  Recent Labs Lab 01/25/17 0413  01/26/17 0419 01/26/17 1210 01/27/17 0426  WBC 7.9  --  10.6*  --  15.3*  HGB 9.4*  < > 9.0* 10.5* 8.5*  HCT 30.6*  < > 30.0* 31.0* 28.2*  PLT 44*  --  45*  --  38*  < > = values in this interval not displayed.  Coag's  Recent Labs Lab 01/23/17 0404 01/23/17 0821 01/24/17 1509  APTT  --  62* 66*  55.2*  INR 1.38 1.33 1.26    Sepsis Markers  Recent Labs Lab 01/22/17 0751  LATICACIDVEN 1.4    ABG  Recent Labs Lab 01/22/17 0728 01/23/17 0330 01/24/17 0440  PHART 7.295* 7.316* 7.340*  PCO2ART 38.3 39.6 41.4  PO2ART 59.0* 70.3* 75.6*    Liver Enzymes  Recent Labs Lab 01/26/17 0419 01/26/17 1600 01/27/17 0426  ALBUMIN 1.9* 1.8*  2.0*    Cardiac Enzymes  Recent Labs Lab 01/22/17 0751  TROPONINI 0.59*    Glucose  Recent Labs Lab 01/26/17 1158 01/26/17 1625 01/26/17 2000 01/27/17 0012 01/27/17 0434 01/27/17 0815  GLUCAP 98 115* 86 192* 164* 161*    Imaging Dg Chest Port 1 View  Result Date: 01/27/2017 CLINICAL DATA:  Extubation. EXAM: PORTABLE CHEST 1 VIEW COMPARISON:  01/26/2017. FINDINGS: Interim extubation and removal of NG tube. Dual-lumen right IJ line and left IJ line stable position. Cardiomegaly with pulmonary vascular prominence and bilateral interstitial prominence consistent with CHF. Pneumonitis cannot be excluded . Interim slight improvement of left base aeration. No pneumothorax. IMPRESSION: 1. Interim extubation and removal of NG tube. Dual-lumen right IJ line left IJ  line stable position. 2. Persistent cardiomegaly, pulmonary venous congestion, bilateral interstitial prominence suggesting CHF. No change. 3. Interim improved aeration left lung base . Electronically Signed   By: Marcello Moores  Register   On: 01/27/2017 07:38   Dg Abd Portable 1v  Result Date: 01/26/2017 CLINICAL DATA:  Feeding tube placement. EXAM: PORTABLE ABDOMEN - 1 VIEW COMPARISON:  01/25/2017 FINDINGS: Feeding tube is unchanged in position with tip again at the expected level of the ligament of Treitz. No dilated loops of bowel are seen in the included left upper abdomen. Bibasilar lung base opacities are partially visualized, more fully evaluated on chest radiograph earlier today. No acute osseous abnormality is seen. IMPRESSION: Unchanged feeding tube with tip of the level of the ligament of Treitz. Electronically Signed   By: Logan Bores M.D.   On: 01/26/2017 10:22    STUDIES: CXR 4/1 > Cardiomegaly with mild persistent pulmonary edema similar to the previous exam CXR 4/9 >Cardiomegaly without acute disease  CT Head 4/11 >mixed density mass like area along the surface of the anterior frontal lobe, with cytotoxic type edema as well as a rounded 2 cm area of hemorrhage EEG 4/13 >> CT Head 4/13 > Left frontal mixed attenuation collection, hyperdense acute hemorrhage posteriorly, max thickness between 13-15 mm, acute left parafalcine subdural hemorrhage, 2 mm left to right shift  MRI Brain 4/13 >>  CULTURES: Blood 4/01 > E faecalis, vancomycin resistant  ANTIBIOTICS: Vancomycin 4/01 > 4/03 Daptomycin 4/03 > 4/05 Ampicillin 4/05 > 4/18 Rocephin 4/05 > 4/18   SIGNIFICANT EVENTS: 3/18 Admit 3/21 start CRRT 3/27 DC cardioversion 3/29 To SDU 3/31 Fever 4/06 Failed repeat DC cardioversion 4/08 Transition off CRRT 4/12 New ICH  4/13 Intubated  4/16 Progressive thrombocytopenia >> hematology consulted  LINES/TUBES: Rt IJ HD cath 3/31 >> Lt IJ CVL 4/02 > 4/12 Lft IJ CVC  4/13  >> Right Femoral Aline 4/14 >> ETT 4/13 >>   DISCUSSION: 61 yo female admitted with CHF exacerbation complicated by renal failure and VRE bacteremia, on 4/12 found to have new ICH after cardioversion  ASSESSMENT / PLAN:  PULMONARY A: Acute on Chronic Hypoxic/Hypercarbic resp failure  H/O COPD, OSA/OHS P:   Maintain Oxygenation >90 Pulmonary Hygiene  Trend CXR  CARDIOVASCULAR A:  Acute on Chronic Combined CHF Cardiogenic Shock  Afib S/P cardioversion 4/12 H/O HTN P:  Cardiac Monitoring  Per Heart Failure  Wean Levophed to Maintain MAP >65 Continue Vasopressin  Continue midodrine, amiodarone  Hold coumadin/heparin in setting of ICH  RENAL A:   Hypokalemia  Acute on Chronic Renal Failure with Cardiorenal syndrome  P:   Neprology following  Continue CRRT - ? 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 Ab elevated 01/23/17, SR assay negative   - last dose of coumadin 4/11, and heparin 4/12 P:  Hold anticoagulation  Transfuse for PLTS less then 20,000 Trend Coag Studies  SCDS  INFECTIOUS A:   VRE Bacteremia  -Completed Antibiotic course on 4/18 P:   5/2 needs follow up blood cultures  Trend WBC and Fever Curve   ENDOCRINE A:   Hyperglycemia    P:   Trend Glucose  SSI Stress Dose steroids started on 4/18   NEUROLOGIC A:   Left Frontal ICH Acute Encephalopathy - Improving  Deconditioning  P:   RASS goal: 0 Monitor  Continue Foot Drop boot  Hold sedating medications    FAMILY  - Updates: boyfriend updated on plan of care   - Inter-disciplinary family meet or Palliative Care meeting due by:  Ongoing    CC Time 33 minutes   Hayden Pedro,  AG-ACNP [facility]  Pgr: [phone]  PCCM Pgr: [phone]

## 2017-01-28 LAB — CBC WITH DIFFERENTIAL/PLATELET
BASOS ABS: 0.2 10*3/uL — AB (ref 0.0–0.1)
BASOS PCT: 1 %
EOS PCT: 1 %
Eosinophils Absolute: 0.2 10*3/uL (ref 0.0–0.7)
HCT: 24.5 % — ABNORMAL LOW (ref 36.0–46.0)
HEMOGLOBIN: 7.5 g/dL — AB (ref 12.0–15.0)
LYMPHS PCT: 12 %
Lymphs Abs: 2 10*3/uL (ref 0.7–4.0)
MCH: 26.5 pg (ref 26.0–34.0)
MCHC: 30.6 g/dL (ref 30.0–36.0)
MCV: 86.6 fL (ref 78.0–100.0)
MONOS PCT: 6 %
Monocytes Absolute: 1 10*3/uL (ref 0.1–1.0)
NEUTROS ABS: 13.3 10*3/uL — AB (ref 1.7–7.7)
Neutrophils Relative %: 80 %
Platelets: 64 10*3/uL — ABNORMAL LOW (ref 150–400)
RBC: 2.83 MIL/uL — ABNORMAL LOW (ref 3.87–5.11)
RDW: 30.6 % — ABNORMAL HIGH (ref 11.5–15.5)
WBC: 16.7 10*3/uL — ABNORMAL HIGH (ref 4.0–10.5)

## 2017-01-28 LAB — RENAL FUNCTION PANEL
ANION GAP: 13 (ref 5–15)
Albumin: 1.9 g/dL — ABNORMAL LOW (ref 3.5–5.0)
Albumin: 1.9 g/dL — ABNORMAL LOW (ref 3.5–5.0)
Anion gap: 16 — ABNORMAL HIGH (ref 5–15)
BUN: 27 mg/dL — ABNORMAL HIGH (ref 6–20)
BUN: 25 mg/dL — AB (ref 6–20)
CHLORIDE: 102 mmol/L (ref 101–111)
CHLORIDE: 104 mmol/L (ref 101–111)
CO2: 22 mmol/L (ref 22–32)
CO2: 20 mmol/L — AB (ref 22–32)
CREATININE: 1.14 mg/dL — AB (ref 0.44–1.00)
Calcium: 7.9 mg/dL — ABNORMAL LOW (ref 8.9–10.3)
Calcium: 7.8 mg/dL — ABNORMAL LOW (ref 8.9–10.3)
Creatinine, Ser: 1.18 mg/dL — ABNORMAL HIGH (ref 0.44–1.00)
GFR calc Af Amer: 58 mL/min — ABNORMAL LOW (ref >60)
GFR calc Af Amer: >60 mL/min (ref >60)
GFR calc non Af Amer: 50 mL/min — ABNORMAL LOW (ref >60)
GFR calc non Af Amer: 52 mL/min — ABNORMAL LOW (ref >60)
GLUCOSE: 167 mg/dL — AB (ref 65–99)
Glucose, Bld: 137 mg/dL — ABNORMAL HIGH (ref 65–99)
PHOSPHORUS: 2 mg/dL — AB (ref 2.5–4.6)
POTASSIUM: 4.1 mmol/L (ref 3.5–5.1)
Phosphorus: 1.5 mg/dL — ABNORMAL LOW (ref 2.5–4.6)
Potassium: 4.4 mmol/L (ref 3.5–5.1)
Sodium: 137 mmol/L (ref 135–145)
Sodium: 140 mmol/L (ref 135–145)

## 2017-01-28 LAB — GLUCOSE, CAPILLARY
GLUCOSE-CAPILLARY: 154 mg/dL — AB (ref 65–99)
Glucose-Capillary: 160 mg/dL — ABNORMAL HIGH (ref 65–99)
Glucose-Capillary: 150 mg/dL — ABNORMAL HIGH (ref 65–99)
Glucose-Capillary: 160 mg/dL — ABNORMAL HIGH (ref 65–99)
Glucose-Capillary: 126 mg/dL — ABNORMAL HIGH (ref 65–99)
Glucose-Capillary: 140 mg/dL — ABNORMAL HIGH (ref 65–99)

## 2017-01-28 LAB — GASTROINTESTINAL PANEL BY PCR, STOOL (REPLACES STOOL CULTURE)

## 2017-01-28 LAB — MAGNESIUM: Magnesium: 2.7 mg/dL — ABNORMAL HIGH (ref 1.7–2.4)

## 2017-01-28 LAB — CALCIUM, IONIZED: Calcium, Ionized, Serum: 4.4 mg/dL — ABNORMAL LOW (ref 4.5–5.6)

## 2017-01-28 MED ORDER — ONDANSETRON HCL 4 MG/2ML IJ SOLN
4.0000 mg | Freq: Four times a day (QID) | INTRAMUSCULAR | Status: DC | PRN
  Administered 2017-02-08 – 2017-02-09 (×2): 4 mg via INTRAVENOUS
  Filled 2017-01-28 (×2): qty 2

## 2017-01-28 MED ORDER — ONDANSETRON HCL 4 MG/2ML IJ SOLN
4.0000 mg | Freq: Four times a day (QID) | INTRAMUSCULAR | Status: DC | PRN
  Administered 2017-01-28: 4 mg via INTRAVENOUS
  Filled 2017-01-28: qty 2

## 2017-01-28 MED ORDER — ONDANSETRON HCL 4 MG/2ML IJ SOLN: 4.0000 mg | INTRAMUSCULAR | Status: DC | PRN

## 2017-01-28 MED ORDER — ORAL CARE MOUTH RINSE
15.0000 mL | Freq: Two times a day (BID) | OROMUCOSAL | Status: DC
  Administered 2017-01-28 – 2017-02-08 (×17): 15 mL via OROMUCOSAL

## 2017-01-28 NOTE — Progress Notes (Signed)
[facility] Progress Note    Assessment/ Plan:   1. Acute oliguric kidney injury: remains anuric.  I had a long talk with her and her family at the bedside today.  I explained that her RV failure combined with pressor requirement will make it extremely difficult to dialyze her with IHD.  I also explained that CRRT was only a measure that was available in the ICU.  Although her decreasing pressor requirements are encouraging, it may not be possible to safely dialyze her out of the ICU.  Therefore, our plans are as follows: hold CRRT next time filter clots off.  Assess pressor requirement.  Attempt IHD if safe.  If not safe, will need to have a serious talk about whether or not she should go back on CRRT.  For now, she desires all aggressive interventions 2. Afib with RVR: on amio gtt at present 3.  Shock 2/2 VRE bacteremia: pressor requirements increasing, leukocytosis rising, off abx now, need to reculture 4.  Acute on chronic hypoxic respiratory failure: extubated 4/18 5.  SDH: AAO x 3  Subjective:    Plts are better, pressors are coming down.  CRRT filter keeps clotting.   Objective:   BP (!) 75/49   Pulse 100   Temp 97.2 F (36.2 C) (Axillary)   Resp 18   Ht _0  (1.727 m)   Wt 124 kg (273 lb 5.9 oz)   LMP 09/07/2011   SpO2 99%   BMI 41.57 kg/m   Intake/Output Summary (Last 24 hours) at 01/28/17 1458 Last data filed at 01/28/17 1400  Gross per 24 hour  Intake          3270.69 ml  Output             3447 ml  Net          -176.31 ml   Weight change:   Physical Exam: Gen: AAO x 3 CVS: tachy, irregular Resp:coarse bilaterally Abd: obese Ext: no edema ACCESS: R TDC in place, dressing c/d/intact.  Imaging: Dg Chest Port 1 View  Result Date: 01/27/2017 CLINICAL DATA:  Extubation. EXAM: PORTABLE CHEST 1 VIEW COMPARISON:  01/26/2017. FINDINGS: Interim extubation and removal of NG tube. Dual-lumen right IJ line and left IJ line stable position. Cardiomegaly  with pulmonary vascular prominence and bilateral interstitial prominence consistent with CHF. Pneumonitis cannot be excluded . Interim slight improvement of left base aeration. No pneumothorax. IMPRESSION: 1. Interim extubation and removal of NG tube. Dual-lumen right IJ line left IJ line stable position. 2. Persistent cardiomegaly, pulmonary venous congestion, bilateral interstitial prominence suggesting CHF. No change. 3. Interim improved aeration left lung base . Electronically Signed   By: Marcello Moores  Register   On: 01/27/2017 07:38    Labs: BMET  Recent Labs Lab 01/25/17 0413  01/25/17 1600 01/25/17 1638  01/26/17 0413 01/26/17 0419 01/26/17 1210 01/26/17 1600 01/27/17 0426 01/27/17 1500 01/28/17 0420  NA 138  < > 140  --   < > 137 138 136 138 137 139 137  K 3.5  < > 3.2*  --   < > 2.9* 2.8* 3.4* 3.7 3.7 3.2* 4.1  CL 89*  < > 87*  --   < > 82* 86* 90* 94* 99* 106 102  CO2 31  --  34*  --   --   --  34*  --  _1 GLUCOSE 146*  < > 137*  --   < > 202* 150* 98 114*  160* 149* 167*  BUN 25*  < > 27*  --   < > 27* 28* 28* 25* 24* 29* 27*  CREATININE 2.70*  < > 2.45*  --   < > 1.70* 2.25* 1.70* 1.70* 1.29* 1.42* 1.18*  CALCIUM 8.7*  --  9.2  --   --   --  9.2  --  8.5* 8.2* 7.0* 7.9*  PHOS 1.8*  --  1.8* 1.8*  --   --  1.7*  --  2.5 1.5* <1.0* 2.0*  < > = values in this interval not displayed. CBC  Recent Labs Lab 01/25/17 0413  01/26/17 0419 01/26/17 1210 01/27/17 0426 01/28/17 0420  WBC 7.9  --  10.6*  --  15.3* 16.7*  NEUTROABS 5.9  --  8.4*  --  11.7* 13.3*  HGB 9.4*  < > 9.0* 10.5* 8.5* 7.5*  HCT 30.6*  < > 30.0* 31.0* 28.2* 24.5*  MCV 88.7  --  88.8  --  88.4 86.6  PLT 44*  --  45*  --  38* 64*  < > = values in this interval not displayed.  Medications:    . chlorhexidine gluconate (MEDLINE KIT)  15 mL Mouth Rinse BID  . Chlorhexidine Gluconate Cloth  6 each Topical Daily  . hydrocortisone sod succinate (SOLU-CORTEF) inj  50 mg Intravenous Q6H  . insulin  aspart  2-6 Units Subcutaneous Q4H  . mouth rinse  15 mL Mouth Rinse 10 times per day  . midodrine  10 mg Oral TID WC  . pantoprazole sodium  40 mg Per Tube Q24H      Madelon Lips, MD 01/28/2017, 2:58 PM

## 2017-01-28 NOTE — Progress Notes (Signed)
[facility] Progress Note Patient Name: Paige Marshall DOB: 11-Sep-1956 MRN: [id number]   Date of Service  01/28/2017  HPI/Events of Note  Hypotension - BP = 75/39.  eICU Interventions  Will order: 1. Nurse to titrate Norepinephrine IV infusion. 2. Respiratory Therapy to place A-line. 3. ABG now.      Intervention Category Major Interventions: Hypotension - evaluation and management  Tucker Minter Cornelia Copa 01/28/2017, 11:36 PM

## 2017-01-28 NOTE — Progress Notes (Signed)
Attempted to place patient on BiPAP before she had fallen asleep, unable to secure mask without very large leak due to NG tube. Patient became frustrated and flustered. Patient began to desaturate to 80% and RT placed patient back on 4L nasal cannula. Patient is tolerating well and RT will continue to monitor.

## 2017-01-28 NOTE — Progress Notes (Signed)
Advanced Heart Failure Rounding Note  PCP: Juluis Mire, NP  Primary Cardiologist: Dr. Gwenlyn Found   Subjective:    s/p failed DCCV 3/28 and 4/5.   01/20/17 - successful DCCV to NSR, lethargic and with garbled speech. CT head showed left frontal intracranial hemorrhage.   01/21/17 MRI head with small left frontal intracerebral hemorrhage with parafalcine small SDH, trace rightward midline shift. No CVA. Intubated for airway control.   CVVHD stopped 01/16/17, restarted on 4/14.  Went back into Afib 4/16. Citrate stopped 01/26/17 with risk of toxicity. Remains on CVVHD without anticoagulation.   Alert, oriented. Remains on CVVHD, pulling ~31m/hr. On vasopressin and levo reduced down to 14m this am. Denies SOB, chest pain. CVP 9. AF rate around 100 on IV amio.   Objective:   Weight Range: 273 lb 5.9 oz (124 kg) Body mass index is 41.57 kg/m.   Vital Signs:   Temp:  [97.2 F (36.2 C)-98.4 F (36.9 C)] 97.2 F (36.2 C) (04/20 0813) Pulse Rate:  [100-121] 104 (04/20 0600) Resp:  [15-25] 15 (04/20 0800) SpO2:  [95 %-100 %] 98 % (04/20 0600) Arterial Line BP: (87-136)/(30-68) 129/64 (04/20 0800) Last BM Date: 01/27/17  Weight change: Filed Weights   01/24/17 0200 01/25/17 0500 01/26/17 0500  Weight: 288 lb 12.8 oz (131 kg) 279 lb 15.8 oz (127 kg) 273 lb 5.9 oz (124 kg)    Intake/Output:   Intake/Output Summary (Last 24 hours) at 01/28/17 0919 Last data filed at 01/28/17 0900  Gross per 24 hour  Intake          3045.59 ml  Output             3077 ml  Net           -31.41 ml     Physical Exam: General: Obese female, fatigued appearing. Lying in bed.  HEENT: Normocephalic.  Neck: supple. Hard to assess JVP, does not appear elevated, LIJ TLC Carotids 2+ bilat; no bruits. No lymphadenopathy or thyromegaly appreciated. Cor: Irregularly irregular rhythm.  Lungs: Clear in upper lobes bilaterally.  Abdomen: obese/ soft NT/ND  + bowel sounds  Extremities: Cool, but improving.  Trace edema.  Neuro: awake. Alert & oriented x3 moves all 4. R foot drop    Telemetry: Afib rates in the 100's- personally reviewed.      Labs: CBC  Recent Labs  01/27/17 0426 01/28/17 0420  WBC 15.3* 16.7*  NEUTROABS 11.7* 13.3*  HGB 8.5* 7.5*  HCT 28.2* 24.5*  MCV 88.4 86.6  PLT 38* 64*   Basic Metabolic Panel  Recent Labs  01/27/17 1500 01/27/17 1600 01/28/17 0420  NA 139  --  137  K 3.2*  --  4.1  CL 106  --  102  CO2 24  --  22  GLUCOSE 149*  --  167*  BUN 29*  --  27*  CREATININE 1.42*  --  1.18*  CALCIUM 7.0*  --  7.9*  MG  --  1.9 2.7*  PHOS <1.0*  --  2.0*   Liver Function Tests  Recent Labs  01/27/17 1500 01/28/17 0420  ALBUMIN 1.6* 1.9*    BNP: BNP (last 3 results)  Recent Labs  09/18/16 1126 11/07/16 1633 12/26/16 1643  BNP 991.0* 490.4* 1,116.0*    Transthoracic Echocardiography 01/11/17 Study Conclusions  - Left ventricle: The cavity size was mildly dilated. Wall   thickness was increased in a pattern of mild LVH. Left   ventricular geometry showed evidence of  eccentric hypertrophy.   Systolic function was moderately reduced. The estimated ejection   fraction was in the range of 35% to 40%. Moderate diffuse   hypokinesis with no identifiable regional variations. Acoustic   contrast opacification revealed no evidence ofthrombus. - Ventricular septum: The contour showed diastolic flattening.   These changes are consistent with RV volume overload. - Aortic valve: There was mild to moderate regurgitation directed   centrally in the LVOT. - Mitral valve: There was mild to moderate regurgitation directed   eccentrically and posteriorly. - Left atrium: The atrium was moderately dilated. - Right ventricle: The cavity size was severely dilated. Wall   thickness was normal. Systolic function was mildly reduced. - Right atrium: The atrium was severely dilated. - Tricuspid valve: There was malcoaptation of the valve leaflets.   There  was moderate-severe regurgitation. - Pulmonary arteries: Systolic pressure was severely increased. PA   peak pressure: 71 mm Hg (S). - Pericardium, extracardiac: A trivial pericardial effusion was   identified posterior to the heart.    Medications:     Scheduled Medications: . chlorhexidine gluconate (MEDLINE KIT)  15 mL Mouth Rinse BID  . Chlorhexidine Gluconate Cloth  6 each Topical Daily  . hydrocortisone sod succinate (SOLU-CORTEF) inj  50 mg Intravenous Q6H  . insulin aspart  2-6 Units Subcutaneous Q4H  . mouth rinse  15 mL Mouth Rinse 10 times per day  . midodrine  10 mg Oral TID WC  . pantoprazole sodium  40 mg Per Tube Q24H    Infusions: . sodium chloride Stopped (01/24/17 0330)  . amiodarone 30 mg/hr (01/28/17 0331)  . feeding supplement (VITAL AF 1.2 CAL) 70 mL/hr at 01/27/17 1500  . norepinephrine (LEVOPHED) Adult infusion 10 mcg/min (01/28/17 0500)  . dialysis replacement fluid (prismasate) 750 mL/hr at 01/27/17 2142  . dialysis replacement fluid (prismasate) 500 mL/hr at 01/27/17 1400  . dialysate (PRISMASATE) 2,000 mL/hr at 01/28/17 0837  . vasopressin (PITRESSIN) infusion - *FOR SHOCK* 0.03 Units/min (01/27/17 1600)    PRN Medications: Place/Maintain arterial line **AND** sodium chloride, acetaminophen (TYLENOL) oral liquid 160 mg/5 mL, diphenhydrAMINE-zinc acetate, Gerhardt's butt cream, senna-docusate   Assessment/Plan   1. Acute on chronic combined systolic and diastolic CHF: EF 73-66% on TEE 01/13/17 (down from 55% in March 2018 when first admitted). Likely tachy mediated from atrial fibrillation.  - co - ox 59 yesterday - CVP improving 9-10.  - Pressors being turned down.  2. Left frontal ICD with parafalcine SDH: Trace right midline shift. No CVA. Per Neuro her small ICH/SDH does not explain the degree of her AMS. Likely a component of metabolic encephalopathy due to renal failure. - No anticoagulation 3. PAF: Failed DCCV 3/27, 4/5. Had DCCV with  return to NSR on 01/20/17, went back into Afib 01/24/17.  - Rates improved today - not a candidate for DCCV as she off anticoagulation 4. Acute on chronic kidney disease: CVVHD restarted on 4/14. - Remains on CVVHD.  - There is concern about her ability to tolerate HD long term.  - CVP improved.  5. Obesity hypoventilation syndrome 6. Septic shock:   - Antibiotic course complete on 01/26/17.   - Repeat cultures negative 7. 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  - No change.  8. Acute respiratory failure: - Extubated.  - On 4L [place] 9. Anemia: Hgb stable.  10. NSVT: Quiescent, stable.  - Continue amiodarone. 11. Thrombocytopenia: 2/2 DIC - SR assay negative - Last had coumadin on  4/11, heparin stopped on 4/12.   Length of Stay: [place], NP  01/28/2017, 9:19 AM Advanced Heart Failure Team  Pager [phone] M-F 7am-4pm.  Please contact [facility] for night-coverage after hours (4p -7a ) and weekends on [email or web address]  Agree  She remains tenuous on dual pressors but we have been able to reduce NE dose dramatically overnight. Will try to wean NE to off today and then work on vasopressin. She is also on midodrine.   CVP 8-9 range. On CVVHD pulling -50. Would try to keep even from here to avoid worsening her hypotension given preload dependence with PAH and RV failure.  Remains in AF. On amio. HR improved. Not candidate for repeat DC-CV at this point given multiple failures and holding AC for ICH. Platelets coming up. No bleeding.   On exam. Much more alert and interactive. Lungs clearing Remains in AF but rate improved. Ab benign  Good BS Extremities warm with trace edema.  Moves all 4.   Continue current plan. We discussed the need to wean pressors and for her to be able to tolerate iHD in order to survive.   The patient is critically ill with multiple organ systems failure and requires high complexity decision making for assessment and  support, frequent evaluation and titration of therapies, application of advanced monitoring technologies and extensive interpretation of multiple databases.   Critical Care Time devoted to patient care services described in this note is 45 Minutes. This time reflects time of care of this signee, Dr. Pierre Bali who personally examined the patient and determined the plan of care. This critical care time does not reflect procedure time, teaching time or supervisory time of our PA/NP/resident but could involve care discussion and coordination time.  Glori Bickers, MD  9:51 AM

## 2017-01-28 NOTE — Procedures (Signed)
Patient seen and examined on Hemodialysis. QB 200, UF goal net neg 50 mL/ hr.  No restart when filter clots. Treatment adjusted as needed.  [facility] 4:10 PM

## 2017-01-28 NOTE — Consult Note (Signed)
   [facility] CM Inpatient Consult   01/28/2017  SHANEESE TAIT [birth year] [phone]    [facility] Care Management follow up. Following to engage for [place] Management program services. Chart reviewed. Mrs. Blaydes remains in ICU. Will continue to follow and engage when appropriate.   Marthenia Rolling, MSN-Ed, RN,BSN [facility] Liaison [phone]

## 2017-01-28 NOTE — Care Management Note (Signed)
Case Management Note   Patient Details  Name: Paige Marshall MRN: [id number] Date of Birth: April 19, 1956  Subjective/Objective:   CHF, pt currently on Milrinone gtt, Amiodarone gtt, Cardizem gtt                    Action/Plan: Discharge Planning: please see previous notes  Chart reviewed. Pt has been to SNF rehab at [facility].  Plan is dc to home with HH. Pt active with [facility] for HHPT, RN, OT and aide. Will need HH orders with [facility] prior to dc. [facility] Liaison, they will continue to follow post dc. Will continue to follow for dc needs.   PCP  EDWARDS, [place]   Expected Discharge Date:                  Expected Discharge Plan:  [place]  In-House Referral:  Clinical Social Work  Discharge planning Services  CM Consult  Post Acute Care Choice:  Home Health Choice offered to:  Patient  DME Arranged:    DME Agency:     HH Arranged:  RN, PT, OT [place] Agency:  [place]  Status of Service:  In process, will continue to follow  If discussed at Long Length of Stay Meetings, dates discussed:    Additional Comments: 01/28/2017  Pt extubated 4/18.  CT Head revealed mixed density mass like area along the surface of the anterior frontal lobe with cytotoxic edema as well as a rounded 2 cm hemorrhage.  Pt remains on CRRT  01/21/17 Pt had to have RR called yesterday on SD - transferred to [facility] - started on pressor and reintubated  01/14/17 Pt remains on multiple drips and CRRT  01/06/17 Pt remains on multiple drips.  CM will continue to follow for discharge needs Maryclare Labrador, RN 01/28/2017, 9:51 AM

## 2017-01-28 NOTE — Progress Notes (Signed)
PULMONARY / CRITICAL CARE MEDICINE   Name: Paige Marshall MRN: [id number] DOB: [birth year]    ADMISSION DATE:  12/26/2016 CONSULTATION DATE:  01/20/2017  REFERRING MD:  Dr. Evette Doffing   CHIEF COMPLAINT:  AMS  Brief:   61 year old woman with poorly controlled OHS, chronic combined systolic and diastolic CHF, a-fib/a-flutter on chronic anticoagulation, hypertension, COPD, CKD Stage 3. Admitted to ED from [place] on 3/18 foracute decompensated CHF complicated by cardiorenal syndrome. During stay developed acute on chronic kidney disease requiring CRRT, now being followed by nephrology for PRN HD. Blood culture positive for VRE bacteremia being treated with ampicillin and ceftriaxone. Transferred to step-down on 4/11. On 4/12 noted to be more lethargic and aphasic. CT Head revealed mixed density mass like area along the surface of the anterior frontal lobe with cytotoxic edema as well as a rounded 2 cm hemorrhage.  SUBJECTIVE:  Extubated 4/18. Doing ok breathing fine. Remains on levophed and vasopressin. Getting CRRT -50cc/hr.   VITAL SIGNS: BP (!) 75/49   Pulse (!) 104   Temp 97.2 F (36.2 C) (Axillary)   Resp 15   Ht 5\' 8"  (1.727 m)   Wt 273 lb 5.9 oz (124 kg)   LMP 09/07/2011   SpO2 98%   BMI 41.57 kg/m   HEMODYNAMICS: CVP:  [15 mmHg-17 mmHg] 15 mmHg  VENTILATOR SETTINGS:    INTAKE / OUTPUT: I/O last 3 completed shifts: In: 50 [I.V.:2059; NG/GT:2380; IV Piggyback:351] Out: 8921 [JHERD:4081; Stool:475]  PHYSICAL EXAMINATION: General:  Adult female, no distress  Neuro:  Alert, oriented,, moves all extremities   HEENT:  Normocephalic  Cardiovascular:  Tachy, Irregular, no MRG, NI S1/S2 Lungs:  Diminished breath sounds, non-labored  Abdomen:  obese, active bowel sounds  Musculoskeletal:  Trace edema b/l lower exts.  Skin:  Warm, dry, intact   LABS:  BMET  Recent Labs Lab 01/27/17 0426 01/27/17 1500 01/28/17 0420  NA 137 139 137  K 3.7 3.2* 4.1  CL 99* 106  102  CO2 25 24 22   BUN 24* 29* 27*  CREATININE 1.29* 1.42* 1.18*  GLUCOSE 160* 149* 167*    Electrolytes  Recent Labs Lab 01/25/17 1638  01/27/17 0426 01/27/17 1500 01/27/17 1600 01/28/17 0420  CALCIUM  --   < > 8.2* 7.0*  --  7.9*  MG 1.7  --   --   --  1.9 2.7*  PHOS 1.8*  < > 1.5* <1.0*  --  2.0*  < > = values in this interval not displayed.  CBC  Recent Labs Lab 01/26/17 0419 01/26/17 1210 01/27/17 0426 01/28/17 0420  WBC 10.6*  --  15.3* 16.7*  HGB 9.0* 10.5* 8.5* 7.5*  HCT 30.0* 31.0* 28.2* 24.5*  PLT 45*  --  38* 64*    Coag's  Recent Labs Lab 01/23/17 0404 01/23/17 0821 01/24/17 1509  APTT  --  62* 66*  55.2*  INR 1.38 1.33 1.26    Sepsis Markers  Recent Labs Lab 01/22/17 0751  LATICACIDVEN 1.4    ABG  Recent Labs Lab 01/22/17 0728 01/23/17 0330 01/24/17 0440  PHART 7.295* 7.316* 7.340*  PCO2ART 38.3 39.6 41.4  PO2ART 59.0* 70.3* 75.6*    Liver Enzymes  Recent Labs Lab 01/27/17 0426 01/27/17 1500 01/28/17 0420  ALBUMIN 2.0* 1.6* 1.9*    Cardiac Enzymes  Recent Labs Lab 01/22/17 0751  TROPONINI 0.59*    Glucose  Recent Labs Lab 01/27/17 1249 01/27/17 1613 01/27/17 1953 01/27/17 2346 01/28/17 0419 01/28/17 0807  GLUCAP  168* 134* 156* 184* 160* 150*    Imaging No results found.  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  CULTURES: Blood 4/01 > E faecalis, vancomycin  resistant  ANTIBIOTICS: Vancomycin 4/01 > 4/03 Daptomycin 4/03 > 4/05 Ampicillin 4/05 > 4/18 Rocephin 4/05 > 4/18   SIGNIFICANT EVENTS: 3/18 Admit 3/21 start CRRT 3/27 DC cardioversion 3/29 To SDU 3/31 Fever 4/06 Failed repeat DC cardioversion 4/08 Transition off CRRT 4/12 New ICH  4/13 Intubated  4/16 Progressive thrombocytopenia >> hematology consulted 4/18 - req pressors 4/20 - remains on pressor today, getting CRRT.   LINES/TUBES: Rt IJ HD cath 3/31 >> Lt IJ CVL 4/02 > 4/12 Lft IJ CVC  4/13 >> Right Femoral Aline 4/14 >> ETT 4/13 >>   DISCUSSION: 61 yo female admitted with CHF exacerbation complicated by renal failure and VRE bacteremia, on 4/12 found to have new ICH after cardioversion  ASSESSMENT / PLAN:  PULMONARY A: Acute on Chronic Hypoxic/Hypercarbic resp failure from CHF - extubated 4/18  H/O COPD, OSA/OHS P:   Maintain Oxygenation >90 Pulmonary Hygiene  Diurese as able with CRRT  CARDIOVASCULAR A:  Acute on Chronic Combined CHF, EF 35-40%, mod AR, mod MR, severely dilated RV and RA, severe PHTN PA p 71.  Cardiogenic Shock on pressors  Afib S/P cardioversion 4/12 on amio H/O HTN P:  CVP is 10 today. May not get much lower with her P HTN.  Cont levophed and vasopressin with MAP goal >65.  Continue midodrine, amiodarone  Hold coumadin/heparin in setting of ICH Cont stress dose steroid f/up CHF team recs.  RENAL A:   Acute on Chronic Renal Failure with Cardiorenal syndrome - on CRRT P:   Neprology following Continue CRRT. Goal will be to see If patient will every be able to transition to HD given ongoing hypotension and pressor requirements. Will need to discuss [place] [facility] with family. Consider keeping net even as patient seems euvolemic today.  Trend BMP Replace electrolyes as needed   GASTROINTESTINAL A:   Dysphagia  Diarrhea  P:   Speech Following, NPO for now with ice chips, FEES planned today? cdiff negative PPI  HEMATOLOGIC A:    ICH  DIC - likely from sepsis, improving Thrombocytopenia 2/2 to DIC, improving -HIT > heparin induced Ab elevated 01/23/17, SR assay negative   - last dose of coumadin 4/11, and heparin 4/12 P:  Hold anticoagulation  Transfuse for PLTS less then 20,000 or if bleeding actively.  Trend Coag Studies  SCDS  INFECTIOUS A:   VRE Bacteremia  -Completed Antibiotic course on 4/18 P:   5/2 needs follow up blood cultures  Trend WBC and Fever Curve   ENDOCRINE A:   Hyperglycemia    Relative AI? - on steroid P:   Trend Glucose  SSI Stress Dose steroids started on 4/18   NEUROLOGIC A:   Left Frontal ICH Acute Encephalopathy - Improving  Deconditioning  P:   RASS goal: 0 Monitor  Continue Foot Drop boot  Hold sedating medications   Dellia Nims, M.D.

## 2017-01-28 NOTE — Progress Notes (Signed)
Notified ELink MD regarding pts BP 70-80s/40-60s. RN restarted levophed gtt. Orders received for RT to try and place an A-line and obtain ABG. Will continue to monitor pt.

## 2017-01-28 NOTE — Progress Notes (Signed)
[facility] Progress Note Patient Name: Paige Marshall DOB: 1956-05-27 MRN: [id number]   Date of Service  01/28/2017  HPI/Events of Note  Nausea and vomiting  eICU Interventions  zofran     Intervention Category Intermediate Interventions: Other: (nausea and vomiting)  Simonne Maffucci 01/28/2017, 3:50 PM

## 2017-01-28 NOTE — Progress Notes (Signed)
  Speech Language Pathology Treatment: Dysphagia  Patient Details Name: Paige Marshall MRN: [id number] DOB: 1956/02/06 Today's Date: 01/28/2017 Time: 6015-6153 SLP Time Calculation (min) (ACUTE ONLY): 12 min  Assessment / Plan / Recommendation Clinical Impression  F/u after yesterday's FEES; pt completed her own oral care after set-up.  Voice quality improved today per RN - adequate intensity, no hoarseness.  Consumed limited ice chips with mod I cues for effortful swallow.  No overt difficulty, no cough.  Per FEES, pt with deficient laryngeal closure, with swallow function deteriorating with fatigue and POs reaching level of vocal folds with no cough elicited. Continue ice chips after oral care through weekend; repeat FEES Monday, 4/23.    HPI HPI: 61 year old woman with poorly controlled OHS, chronic combined systolic and diastolic CHF, a-fib/a-flutter on chronic anticoagulation, hypertension, COPD, CKD Stage 3. Admitted to ED from [facility] on 3/18 foracute decompensated CHF complicated by cardiorenal syndrome. During stay developed acute on chronic kidney disease requiring CRRT, now being followed by nephrology for PRN HD. Blood culture positive for VRE bacteremia being treated with ampicillin and ceftriaxone. Transferred to step-down on 4/11. On 4/12 noted to be more lethargic and aphasic. CT Head revealed mixed density mass like area along the surface of the anterior frontal lobe with cytotoxic edema as well as a rounded 2 cm hemorrhage.ETT 4/13-4/18.       SLP Plan  Continue with current plan of care       Recommendations  Diet recommendations: NPO (ice chips after oral care) Medication Administration: Via alternative means                Oral Care Recommendations: Oral care QID Follow up Recommendations: Inpatient Rehab SLP Visit Diagnosis: Dysphagia, oropharyngeal phase (R13.12) Plan: Continue with current plan of care       GO                Juan Quam  Laurice 01/28/2017, 11:02 AM

## 2017-01-29 LAB — RENAL FUNCTION PANEL
ALBUMIN: 2.2 g/dL — AB (ref 3.5–5.0)
ANION GAP: 10 (ref 5–15)
Albumin: 2.2 g/dL — ABNORMAL LOW (ref 3.5–5.0)
Anion gap: 14 (ref 5–15)
BUN: 27 mg/dL — ABNORMAL HIGH (ref 6–20)
BUN: 40 mg/dL — ABNORMAL HIGH (ref 6–20)
CALCIUM: 8.1 mg/dL — AB (ref 8.9–10.3)
CO2: 27 mmol/L (ref 22–32)
CO2: 21 mmol/L — ABNORMAL LOW (ref 22–32)
Calcium: 8.2 mg/dL — ABNORMAL LOW (ref 8.9–10.3)
Chloride: 100 mmol/L — ABNORMAL LOW (ref 101–111)
Chloride: 103 mmol/L (ref 101–111)
Creatinine, Ser: 1.14 mg/dL — ABNORMAL HIGH (ref 0.44–1.00)
Creatinine, Ser: 1.86 mg/dL — ABNORMAL HIGH (ref 0.44–1.00)
GFR calc Af Amer: 33 mL/min — ABNORMAL LOW
GFR calc non Af Amer: 52 mL/min — ABNORMAL LOW (ref >60)
GFR calc non Af Amer: 29 mL/min — ABNORMAL LOW
GLUCOSE: 191 mg/dL — AB (ref 65–99)
Glucose, Bld: 135 mg/dL — ABNORMAL HIGH (ref 65–99)
PHOSPHORUS: 1.8 mg/dL — AB (ref 2.5–4.6)
POTASSIUM: 4.6 mmol/L (ref 3.5–5.1)
Phosphorus: 2.2 mg/dL — ABNORMAL LOW (ref 2.5–4.6)
Potassium: 5.2 mmol/L — ABNORMAL HIGH (ref 3.5–5.1)
SODIUM: 137 mmol/L (ref 135–145)
Sodium: 138 mmol/L (ref 135–145)

## 2017-01-29 LAB — CBC WITH DIFFERENTIAL/PLATELET
BASOS ABS: 0.2 10*3/uL — AB (ref 0.0–0.1)
Basophils Relative: 1 %
EOS ABS: 0.2 10*3/uL (ref 0.0–0.7)
Eosinophils Relative: 1 %
HCT: 23.4 % — ABNORMAL LOW (ref 36.0–46.0)
HEMOGLOBIN: 7.3 g/dL — AB (ref 12.0–15.0)
LYMPHS PCT: 13 %
Lymphs Abs: 2.2 10*3/uL (ref 0.7–4.0)
MCH: 27.7 pg (ref 26.0–34.0)
MCHC: 31.2 g/dL (ref 30.0–36.0)
MCV: 88.6 fL (ref 78.0–100.0)
MONOS PCT: 6 %
Monocytes Absolute: 1 10*3/uL (ref 0.1–1.0)
NEUTROS ABS: 13.1 10*3/uL — AB (ref 1.7–7.7)
NEUTROS PCT: 79 %
PLATELETS: 71 10*3/uL — AB (ref 150–400)
RBC: 2.64 MIL/uL — AB (ref 3.87–5.11)
RDW: 32.2 % — ABNORMAL HIGH (ref 11.5–15.5)
WBC: 16.7 10*3/uL — ABNORMAL HIGH (ref 4.0–10.5)

## 2017-01-29 LAB — GLUCOSE, CAPILLARY
GLUCOSE-CAPILLARY: 182 mg/dL — AB (ref 65–99)
GLUCOSE-CAPILLARY: 179 mg/dL — AB (ref 65–99)
GLUCOSE-CAPILLARY: 139 mg/dL — AB (ref 65–99)
GLUCOSE-CAPILLARY: 179 mg/dL — AB (ref 65–99)
Glucose-Capillary: 157 mg/dL — ABNORMAL HIGH (ref 65–99)
Glucose-Capillary: 123 mg/dL — ABNORMAL HIGH (ref 65–99)

## 2017-01-29 MED ORDER — HEPARIN SODIUM (PORCINE) 1000 UNIT/ML DIALYSIS
1000.0000 [IU] | INTRAMUSCULAR | Status: DC | PRN
  Administered 2017-01-29: 3200 [IU] via INTRAVENOUS_CENTRAL
  Filled 2017-01-29 (×2): qty 6

## 2017-01-29 NOTE — Progress Notes (Addendum)
Advanced Heart Failure Rounding Note  PCP: Juluis Mire, NP  Primary Cardiologist: Dr. Gwenlyn Found   Subjective:    s/p failed DCCV 3/28 and 4/5.   01/20/17 - successful DCCV to NSR, lethargic and with garbled speech. CT head showed left frontal intracranial hemorrhage.   01/21/17 MRI head with small left frontal intracerebral hemorrhage with parafalcine small SDH, trace rightward midline shift. No CVA. Intubated for airway control.   CVVHD stopped 01/16/17, restarted on 4/14.  Went back into Afib 4/16.   Attempted to wean NE yesterday but dropped pressure into 70s. Remains on NE 6 and vasopressin drips. CVP 14. CRRT on hold. Remains in AF - rate better today 90-110. On IV amio. Off heparin due to [place]. Denies CP or SOB.    Objective:   Weight Range: 124 kg (273 lb 5.9 oz) Body mass index is 41.57 kg/m.   Vital Signs:   Temp:  [96.7 F (35.9 C)-98.5 F (36.9 C)] 98.5 F (36.9 C) (04/21 0800) Pulse Rate:  [35-116] 116 (04/21 1100) Resp:  [13-26] 15 (04/21 1100) BP: (68-203)/(53-115) 158/110 (04/21 1100) SpO2:  [3 %-100 %] 89 % (04/21 1100) Arterial Line BP: (84-115)/(37-54) 99/44 (04/20 1900) Last BM Date: 01/29/17  Weight change: Filed Weights   01/24/17 0200 01/25/17 0500 01/26/17 0500  Weight: 131 kg (288 lb 12.8 oz) 127 kg (279 lb 15.8 oz) 124 kg (273 lb 5.9 oz)    Intake/Output:   Intake/Output Summary (Last 24 hours) at 01/29/17 1114 Last data filed at 01/29/17 1100  Gross per 24 hour  Intake          2622.48 ml  Output             1960 ml  Net           662.48 ml     Physical Exam: General: Obese female, lying in bed NAD HEENT: Normocephalic. Anicteric +NGT Neck: supple. JVP elevated to jaw  LIJ TLC Carotids 2+ bilat; no bruits. No lymphadenopathy or thyromegaly appreciated. Cor: Irr IRR tachy +s3 tunneled HD cath Lungs: CTA.  No wheezing Abdomen: obese soft. NT/ND good BS Extremities: Warm. Trace-1+ edema  Neuro: awake alert moves all 4 depressed  affcte   Telemetry: Afib 100-120 Personally reviewed      Labs: CBC  Recent Labs  01/28/17 0420 01/29/17 0429  WBC 16.7* 16.7*  NEUTROABS 13.3* 13.1*  HGB 7.5* 7.3*  HCT 24.5* 23.4*  MCV 86.6 88.6  PLT 64* 71*   Basic Metabolic Panel  Recent Labs  01/27/17 1600 01/28/17 0420 01/28/17 1616 01/29/17 0429  NA  --  137 140 137  K  --  4.1 4.4 4.6  CL  --  102 104 100*  CO2  --  22 20* 27  GLUCOSE  --  167* 137* 191*  BUN  --  27* 25* 27*  CREATININE  --  1.18* 1.14* 1.14*  CALCIUM  --  7.9* 7.8* 8.1*  MG 1.9 2.7*  --   --   PHOS  --  2.0* 1.5* 1.8*   Liver Function Tests  Recent Labs  01/28/17 1616 01/29/17 0429  ALBUMIN 1.9* 2.2*    BNP: BNP (last 3 results)  Recent Labs  09/18/16 1126 11/07/16 1633 12/26/16 1643  BNP 991.0* 490.4* 1,116.0*    Transthoracic Echocardiography 01/11/17 Study Conclusions  - Left ventricle: The cavity size was mildly dilated. Wall   thickness was increased in a pattern of mild LVH. Left   ventricular  geometry showed evidence of eccentric hypertrophy.   Systolic function was moderately reduced. The estimated ejection   fraction was in the range of 35% to 40%. Moderate diffuse   hypokinesis with no identifiable regional variations. Acoustic   contrast opacification revealed no evidence ofthrombus. - Ventricular septum: The contour showed diastolic flattening.   These changes are consistent with RV volume overload. - Aortic valve: There was mild to moderate regurgitation directed   centrally in the LVOT. - Mitral valve: There was mild to moderate regurgitation directed   eccentrically and posteriorly. - Left atrium: The atrium was moderately dilated. - Right ventricle: The cavity size was severely dilated. Wall   thickness was normal. Systolic function was mildly reduced. - Right atrium: The atrium was severely dilated. - Tricuspid valve: There was malcoaptation of the valve leaflets.   There was moderate-severe  regurgitation. - Pulmonary arteries: Systolic pressure was severely increased. PA   peak pressure: 71 mm Hg (S). - Pericardium, extracardiac: A trivial pericardial effusion was   identified posterior to the heart.    Medications:     Scheduled Medications: . Chlorhexidine Gluconate Cloth  6 each Topical Daily  . hydrocortisone sod succinate (SOLU-CORTEF) inj  50 mg Intravenous Q6H  . insulin aspart  2-6 Units Subcutaneous Q4H  . mouth rinse  15 mL Mouth Rinse BID  . midodrine  10 mg Oral TID WC  . pantoprazole sodium  40 mg Per Tube Q24H    Infusions: . sodium chloride Stopped (01/24/17 0330)  . amiodarone 30 mg/hr (01/29/17 0310)  . feeding supplement (VITAL AF 1.2 CAL) 1,000 mL (01/29/17 0213)  . norepinephrine (LEVOPHED) Adult infusion 8 mcg/min (01/29/17 0934)  . vasopressin (PITRESSIN) infusion - *FOR SHOCK* 0.03 Units/min (01/28/17 1400)    PRN Medications: Place/Maintain arterial line **AND** sodium chloride, acetaminophen (TYLENOL) oral liquid 160 mg/5 mL, diphenhydrAMINE-zinc acetate, Gerhardt's butt cream, heparin, ondansetron (ZOFRAN) IV, senna-docusate   Assessment/Plan   1. Acute on chronic combined systolic and diastolic CHF: EF 16-07% on TEE 01/13/17 (down from 55% in March 2018 when first admitted). Likely tachy mediated from atrial fibrillation.  - She has biventricular failure R>>L - Now off CVVHD for 2 days. Volume status creeping back up but not bad.  - Back on dual pressors. Failed wean yesterday. Attempting to wean again today. Will increase midodrine to 15 tid.  2. Left frontal ICD with parafalcine SDH: Trace right midline shift. No CVA. Per Neuro her small ICH/SDH does not explain the degree of her AMS. Likely a component of metabolic encephalopathy due to renal failure. - Neuro status ok. Remains off heparin. 3. PAF: Failed DCCV 3/27, 4/5. Had DCCV with return to NSR on 01/20/17, went back into Afib 01/24/17.  - Rate somewhat better controlled today.  Continue IV amio. - Not a candidate for DCCV as she is off anticoagulation due to [place]  4. Acute on chronic kidney disease: - No evidence of renal recovery. CVVH on hold.  Continues to require pressors to support BP so not a candidate for IHD.  - We are currently leeting CVP climb to increase preload and reasses over next 48-72 hours. If still unable to tolerate iHD. Palliative care is only option. Bot Dr. Hollie Salk and I discussed this with her again today.  5. Obesity hypoventilation syndrome 6. Septic shock:   - Antibiotic course complete on 01/26/17.   - Repeat bcx remain NGTD. Afebrile 7. 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  - This  is severe. Possible end-stage  8. Acute respiratory failure: - Extubated. Respiratory status ok for now.  - On 4L [place] 9. Anemia: Hgb 6.6 today - will give 2u RBCs 10. NSVT: Quiescent, stable.  - Continue amiodarone. 11. Thrombocytopenia: 2/2 DIC - SR assay negative -PLTs climbing 115k today. No bleeding  12. Tube feeds  -seems to have recovered from stroke. Will ask speech if we can pull NGT  The patient is critically ill with multiple organ systems failure and requires high complexity decision making for assessment and support, frequent evaluation and titration of therapies, application of advanced monitoring technologies and extensive interpretation of multiple databases.   Critical Care Time devoted to patient care services described in this note is 35 Minutes. This time reflects time of care of this signee, Dr. Pierre Bali who personally examined the patient and determined the plan of care. This critical care time does not reflect procedure time, teaching time or supervisory time of our PA/NP/resident but could involve care discussion and coordination time.     Length of Stay: 32   Glori Bickers, MD  01/29/2017, 11:14 AM Advanced Heart Failure Team  Pager [phone] M-F 7am-4pm.  Please contact [facility] for  night-coverage after hours (4p -7a ) and weekends on [email or web address]

## 2017-01-29 NOTE — Progress Notes (Signed)
This RT attempted to place arterial line twice without success. Got blood return on second attempt, but unable to thread catheter. RT notified RN.

## 2017-01-29 NOTE — Progress Notes (Signed)
[facility] Progress Note    Assessment/ Plan:   1. Acute oliguric kidney injury: remains anuric.  I had a long talk with her and her family at the bedside yesterday I explained that her RV failure combined with pressor requirement will make it extremely difficult to dialyze her with IHD.  I also explained that CRRT was only a measure that was available in the ICU.  We will see what her pressor requirement is today of CRRT.  If she's still requiring pressor or if BP s are even soft, I don't think IHD would be safe or feasible.  In that setting, I would recommend end of life discussions.   2. Afib with RVR: on amio gtt at present 3.  Shock, persistent: repeat cultures negative so far 4/19 (previously had VRE bacteremia), on midodrine, on stress dose steroids 4.  Acute on chronic hypoxic respiratory failure: extubated 4/18.  Trying to use BiPaP overnight 5.  SDH: AAO x 3  Subjective:    Transitioned off and then on for pressors overnight.  CRRT filter clotted around 0530 this AM. Hold for now   Objective:   BP (!) 142/110 (BP Location: Left Arm)   Pulse (!) 115   Temp 98.5 F (36.9 C) (Oral)   Resp (!) 22   Ht 5\' 8"  (1.727 m)   Wt 124 kg (273 lb 5.9 oz)   LMP 09/07/2011   SpO2 96%   BMI 41.57 kg/m   Intake/Output Summary (Last 24 hours) at 01/29/17 0941 Last data filed at 01/29/17 0800  Gross per 24 hour  Intake           2288.3 ml  Output             2124 ml  Net            164.3 ml   Weight change:   Physical Exam: Gen: sleepier today CVS: tachy, irregular Resp:coarse bilaterally Abd: obese Ext: no edema ACCESS: R TDC in place, dressing c/d/intact.  Imaging: No results found.  Labs: BMET  Recent Labs Lab 01/26/17 0419 01/26/17 1210 01/26/17 1600 01/27/17 0426 01/27/17 1500 01/28/17 0420 01/28/17 1616 01/29/17 0429  NA 138 136 138 137 139 137 140 137  K 2.8* 3.4* 3.7 3.7 3.2* 4.1 4.4 4.6  CL 86* 90* 94* 99* 106 102 104 100*  CO2 34*  --   27 25 24 22  20* 27  GLUCOSE 150* 98 114* 160* 149* 167* 137* 191*  BUN 28* 28* 25* 24* 29* 27* 25* 27*  CREATININE 2.25* 1.70* 1.70* 1.29* 1.42* 1.18* 1.14* 1.14*  CALCIUM 9.2  --  8.5* 8.2* 7.0* 7.9* 7.8* 8.1*  PHOS 1.7*  --  2.5 1.5* <1.0* 2.0* 1.5* 1.8*   CBC  Recent Labs Lab 01/26/17 0419 01/26/17 1210 01/27/17 0426 01/28/17 0420 01/29/17 0429  WBC 10.6*  --  15.3* 16.7* 16.7*  NEUTROABS 8.4*  --  11.7* 13.3* 13.1*  HGB 9.0* 10.5* 8.5* 7.5* 7.3*  HCT 30.0* 31.0* 28.2* 24.5* 23.4*  MCV 88.8  --  88.4 86.6 88.6  PLT 45*  --  38* 64* 71*    Medications:    . Chlorhexidine Gluconate Cloth  6 each Topical Daily  . hydrocortisone sod succinate (SOLU-CORTEF) inj  50 mg Intravenous Q6H  . insulin aspart  2-6 Units Subcutaneous Q4H  . mouth rinse  15 mL Mouth Rinse BID  . midodrine  10 mg Oral TID WC  . pantoprazole sodium  40 mg  Per Tube Q24H      Madelon Lips, MD 01/29/2017, 9:41 AM

## 2017-01-29 NOTE — Progress Notes (Signed)
Didn't tolerate Bipap due to coretrak and mask leak.  Filter clotted and transitioned off CRRT.  Had A line d/c and wean off pressors, but then BP low so back on pressors.  BP (!) 91/53   Pulse 98   Temp (!) 96.7 F (35.9 C) (Axillary)   Resp 13   Ht 5\' 8"  (1.727 m)   Wt 273 lb 5.9 oz (124 kg)   LMP 09/07/2011   SpO2 99%   BMI 41.57 kg/m   Appears more depressed.  HR irregular.  No wheeze.  Abd soft.  1+ edema.  CMP Latest Ref Rng & Units 01/29/2017 01/28/2017 01/28/2017  Glucose 65 - 99 mg/dL 191(H) 137(H) 167(H)  BUN 6 - 20 mg/dL 27(H) 25(H) 27(H)  Creatinine 0.44 - 1.00 mg/dL 1.14(H) 1.14(H) 1.18(H)  Sodium 135 - 145 mmol/L 137 140 137  Potassium 3.5 - 5.1 mmol/L 4.6 4.4 4.1  Chloride 101 - 111 mmol/L 100(L) 104 102  CO2 22 - 32 mmol/L 27 20(L) 22  Calcium 8.9 - 10.3 mg/dL 8.1(L) 7.8(L) 7.9(L)  Total Protein 6.5 - 8.1 g/dL - - -  Total Bilirubin 0.3 - 1.2 mg/dL - - -  Alkaline Phos 38 - 126 U/L - - -  AST 15 - 41 U/L - - -  ALT 14 - 54 U/L - - -   CBC Latest Ref Rng & Units 01/29/2017 01/28/2017 01/27/2017  WBC 4.0 - 10.5 K/uL 16.7(H) 16.7(H) 15.3(H)  Hemoglobin 12.0 - 15.0 g/dL 7.3(L) 7.5(L) 8.5(L)  Hematocrit 36.0 - 46.0 % 23.4(L) 24.5(L) 28.2(L)  Platelets 150 - 400 K/uL 71(L) 64(L) 38(L)    Assessment/plan:  Acute on chronic combined CHF. Cardiogenic shock. A fib with RVR. - pressors to keep MAP > 60 - continue midodrine  Acute renal failure. - trying to transition to intermittent HD - not sure her blood pressure will tolerate this  VRE bacteremia. - f/u blood culture from 4/19  Acute on chronic respiratory failure. Hx of COPD, OSA, OHS. - prn BDs - try on Bipap once coretrak d/c and she can get better mask fit  Dysphagia. - f/u with speech  Thrombocytopenia. - likely DIC - improving - f/u CBC  Relative adrenal insufficiency. - continue solu cortef while on pressors  Acute metabolic encephalopathy. - likely from uremia and hypotension -  improved - monitor mental status  If she is not able to tolerate intermittent HD, then might need to transition to palliative care.  Chesley Mires, MD [facility] Pulmonary/Critical Care 01/29/2017, 8:05 AM Pager:  [phone] After 3pm call: [phone]

## 2017-01-30 LAB — GLUCOSE, CAPILLARY
GLUCOSE-CAPILLARY: 188 mg/dL — AB (ref 65–99)
GLUCOSE-CAPILLARY: 180 mg/dL — AB (ref 65–99)
GLUCOSE-CAPILLARY: 193 mg/dL — AB (ref 65–99)
GLUCOSE-CAPILLARY: 185 mg/dL — AB (ref 65–99)
GLUCOSE-CAPILLARY: 118 mg/dL — AB (ref 65–99)
GLUCOSE-CAPILLARY: 182 mg/dL — AB (ref 65–99)
Glucose-Capillary: 169 mg/dL — ABNORMAL HIGH (ref 65–99)

## 2017-01-30 LAB — PREPARE RBC (CROSSMATCH)

## 2017-01-30 LAB — RENAL FUNCTION PANEL
ALBUMIN: 2.2 g/dL — AB (ref 3.5–5.0)
ANION GAP: 14 (ref 5–15)
Albumin: 2.2 g/dL — ABNORMAL LOW (ref 3.5–5.0)
Anion gap: 15 (ref 5–15)
BUN: 62 mg/dL — AB (ref 6–20)
BUN: 78 mg/dL — ABNORMAL HIGH (ref 6–20)
CHLORIDE: 100 mmol/L — AB (ref 101–111)
CHLORIDE: 102 mmol/L (ref 101–111)
CO2: 23 mmol/L (ref 22–32)
CO2: 23 mmol/L (ref 22–32)
Calcium: 8.3 mg/dL — ABNORMAL LOW (ref 8.9–10.3)
Calcium: 8.3 mg/dL — ABNORMAL LOW (ref 8.9–10.3)
Creatinine, Ser: 2.41 mg/dL — ABNORMAL HIGH (ref 0.44–1.00)
Creatinine, Ser: 2.92 mg/dL — ABNORMAL HIGH (ref 0.44–1.00)
GFR calc Af Amer: 24 mL/min — ABNORMAL LOW (ref >60)
GFR calc Af Amer: 19 mL/min — ABNORMAL LOW (ref >60)
GFR calc non Af Amer: 21 mL/min — ABNORMAL LOW (ref >60)
GFR calc non Af Amer: 17 mL/min — ABNORMAL LOW (ref >60)
GLUCOSE: 195 mg/dL — AB (ref 65–99)
GLUCOSE: 190 mg/dL — AB (ref 65–99)
PHOSPHORUS: 3.4 mg/dL (ref 2.5–4.6)
POTASSIUM: 5.3 mmol/L — AB (ref 3.5–5.1)
POTASSIUM: 5.6 mmol/L — AB (ref 3.5–5.1)
Phosphorus: 2.8 mg/dL (ref 2.5–4.6)
Sodium: 138 mmol/L (ref 135–145)
Sodium: 139 mmol/L (ref 135–145)

## 2017-01-30 LAB — CBC WITH DIFFERENTIAL/PLATELET
BASOS PCT: 1 %
Basophils Absolute: 0.2 10*3/uL — ABNORMAL HIGH (ref 0.0–0.1)
EOS PCT: 0 %
Eosinophils Absolute: 0 10*3/uL (ref 0.0–0.7)
HEMATOCRIT: 21.9 % — AB (ref 36.0–46.0)
HEMOGLOBIN: 6.6 g/dL — AB (ref 12.0–15.0)
LYMPHS ABS: 1.8 10*3/uL (ref 0.7–4.0)
Lymphocytes Relative: 9 %
MCH: 26.1 pg (ref 26.0–34.0)
MCHC: 30.1 g/dL (ref 30.0–36.0)
MCV: 86.6 fL (ref 78.0–100.0)
MONOS PCT: 9 %
Monocytes Absolute: 1.8 10*3/uL — ABNORMAL HIGH (ref 0.1–1.0)
NEUTROS ABS: 16.3 10*3/uL — AB (ref 1.7–7.7)
Neutrophils Relative %: 81 %
Platelets: 115 10*3/uL — ABNORMAL LOW (ref 150–400)
RBC: 2.53 MIL/uL — AB (ref 3.87–5.11)
RDW: 34.8 % — ABNORMAL HIGH (ref 11.5–15.5)
WBC: 20.1 10*3/uL — AB (ref 4.0–10.5)

## 2017-01-30 LAB — CALCIUM, IONIZED
Calcium, Ionized, Serum: 4.5 mg/dL (ref 4.5–5.6)
Calcium, Ionized, Serum: 4.4 mg/dL — ABNORMAL LOW (ref 4.5–5.6)

## 2017-01-30 MED ORDER — SODIUM CHLORIDE 0.9 % IV SOLN
Freq: Once | INTRAVENOUS | Status: AC
  Administered 2017-01-30: 08:00:00 via INTRAVENOUS

## 2017-01-30 MED ORDER — DARBEPOETIN ALFA 150 MCG/0.3ML IJ SOSY
150.0000 ug | PREFILLED_SYRINGE | INTRAMUSCULAR | Status: DC
  Administered 2017-01-31: 150 ug via SUBCUTANEOUS
  Filled 2017-01-30 (×2): qty 0.3

## 2017-01-30 MED ORDER — PATIROMER SORBITEX CALCIUM 8.4 G PO PACK
8.4000 g | PACK | Freq: Once | ORAL | Status: AC
  Administered 2017-01-30: 8.4 g via ORAL
  Filled 2017-01-30: qty 4

## 2017-01-30 MED ORDER — SODIUM POLYSTYRENE SULFONATE 15 GM/60ML PO SUSP
30.0000 g | Freq: Once | ORAL | Status: AC
  Administered 2017-01-30: 30 g
  Filled 2017-01-30: qty 120

## 2017-01-30 MED ORDER — SODIUM CHLORIDE 0.9 % IV SOLN
Freq: Once | INTRAVENOUS | Status: AC
  Administered 2017-01-30: 13:00:00 via INTRAVENOUS

## 2017-01-30 MED ORDER — MIDODRINE HCL 5 MG PO TABS
15.0000 mg | ORAL_TABLET | Freq: Three times a day (TID) | ORAL | Status: DC
  Administered 2017-01-30 – 2017-02-10 (×32): 15 mg via ORAL
  Filled 2017-01-30 (×31): qty 3

## 2017-01-30 NOTE — Progress Notes (Signed)
  [facility] Progress Note    Assessment/ Plan:   1. Acute oliguric kidney injury: remains anuric.  Still requiring pressors.  CRRT on hold since 0530 AM 4/21.  Labs reflect absence of renal recovery.  CVP 14.  Hold RRT one more day, try to decrease pressor, if not successful then need to discuss end of life.  We have discussed this previously and pt and family do not "want to give up yet"  Medically managing K of 5.3. 2. Afib with RVR: on amio gtt at present 3.  Shock, persistent: repeat cultures negative so far 4/19 (previously had VRE bacteremia), on midodrine, on stress dose steroids 4.  Acute on chronic hypoxic respiratory failure: extubated 4/18.  Trying to use BiPaP overnight 5.  SDH: AAO x 3 6.  Anemia: pRBCs prn, will add Aranesp q Monday 7.  Thrombocytopenia- likely DIC, improving.  HIT ab + but assay neg   Subjective:    Still on norepinephrine at 6 and vaso at 0.03.  CVP 14 this AM   Objective:   BP (!) 111/55   Pulse (!) 105   Temp 98.5 F (36.9 C) (Oral)   Resp 19   Ht 5\' 8"  (1.727 m)   Wt 124 kg (273 lb 5.9 oz)   LMP 09/07/2011   SpO2 90%   BMI 41.57 kg/m   Intake/Output Summary (Last 24 hours) at 01/30/17 0744 Last data filed at 01/30/17 0700  Gross per 24 hour  Intake          2812.77 ml  Output                0 ml  Net          2812.77 ml   Weight change:   Physical Exam: Gen: NAD, bright affect this AM CVS: regular today Resp:clear anteriorly, muffled at bases Abd: obese Ext: maybe trace dependent edema ACCESS: R TDC in place, dressing c/d/intact. EXT: L thumb and index finger with bluish discoloration; ? Previous A line site  Imaging: No results found.  Labs: BMET  Recent Labs Lab 01/27/17 0426 01/27/17 1500 01/28/17 0420 01/28/17 1616 01/29/17 0429 01/29/17 1554 01/30/17 0433  NA 137 139 137 140 137 138 138  K 3.7 3.2* 4.1 4.4 4.6 5.2* 5.3*  CL 99* 106 102 104 100* 103 100*  CO2 25 24 22  20* 27 21* 23  GLUCOSE  160* 149* 167* 137* 191* 135* 195*  BUN 24* 29* 27* 25* 27* 40* 62*  CREATININE 1.29* 1.42* 1.18* 1.14* 1.14* 1.86* 2.41*  CALCIUM 8.2* 7.0* 7.9* 7.8* 8.1* 8.2* 8.3*  PHOS 1.5* <1.0* 2.0* 1.5* 1.8* 2.2* 2.8   CBC  Recent Labs Lab 01/27/17 0426 01/28/17 0420 01/29/17 0429 01/30/17 0433  WBC 15.3* 16.7* 16.7* 20.1*  NEUTROABS 11.7* 13.3* 13.1* 16.3*  HGB 8.5* 7.5* 7.3* 6.6*  HCT 28.2* 24.5* 23.4* 21.9*  MCV 88.4 86.6 88.6 86.6  PLT 38* 64* 71* 115*    Medications:    . Chlorhexidine Gluconate Cloth  6 each Topical Daily  . hydrocortisone sod succinate (SOLU-CORTEF) inj  50 mg Intravenous Q6H  . insulin aspart  2-6 Units Subcutaneous Q4H  . mouth rinse  15 mL Mouth Rinse BID  . midodrine  10 mg Oral TID WC  . pantoprazole sodium  40 mg Per Tube Q24H  . patiromer  8.4 g Oral Once      Madelon Lips, MD 01/30/2017, 7:44 AM

## 2017-01-30 NOTE — Progress Notes (Addendum)
1600 Potassium level 5.6.  Called eLink and spoke with MD Melvyn Novas, MD [place] advised to notify Nephrology physician on call as rounding Nephrology physician prescribed medical management of AM potassium level.  Sent page via Shea Evans to MD [place] with Nephrology.  1825 - sent second page to MD [place], new orders received.

## 2017-01-30 NOTE — Plan of Care (Signed)
Problem: Pain Managment: Goal: General experience of comfort will improve Outcome: Progressing Gerhardts Butt cream is effective in relieving MASD pain  Problem: Physical Regulation: Goal: Ability to maintain clinical measurements within normal limits will improve Outcome: Not Progressing Restarted Levo on night  Problem: Activity: Goal: Risk for activity intolerance will decrease Outcome: Progressing Sat in chair during day

## 2017-01-30 NOTE — Progress Notes (Signed)
Off CRRT since 4/21 5 AM. Breathing ok, no chest pain, no other complaints, remains hopeful for improvement of medical status.   BP 91/65   Pulse (!) 103   Temp 97.8 F (36.6 C) (Oral)   Resp 16   Ht 5\' 8"  (1.727 m)   Wt 273 lb 5.9 oz (124 kg)   LMP 09/07/2011   SpO2 100%   BMI 41.57 kg/m   Vitals reviewed. General: resting in bed, NAD HEENT: PERRL, EOMI, no scleral icterus Cardiac: irregular  Pulm: clear to auscultation bilaterally, no wheezes Abd: soft, nontender, nondistended, BS present Ext: warm and well perfused, 1+ edema.  Neuro: alert and oriented X3, cranial nerves II-XII grossly intact, strength and sensation to light touch equal in bilateral upper and lower extremities  CMP Latest Ref Rng & Units 01/30/2017 01/29/2017 01/29/2017  Glucose 65 - 99 mg/dL 195(H) 135(H) 191(H)  BUN 6 - 20 mg/dL 62(H) 40(H) 27(H)  Creatinine 0.44 - 1.00 mg/dL 2.41(H) 1.86(H) 1.14(H)  Sodium 135 - 145 mmol/L 138 138 137  Potassium 3.5 - 5.1 mmol/L 5.3(H) 5.2(H) 4.6  Chloride 101 - 111 mmol/L 100(L) 103 100(L)  CO2 22 - 32 mmol/L 23 21(L) 27  Calcium 8.9 - 10.3 mg/dL 8.3(L) 8.2(L) 8.1(L)  Total Protein 6.5 - 8.1 g/dL - - -  Total Bilirubin 0.3 - 1.2 mg/dL - - -  Alkaline Phos 38 - 126 U/L - - -  AST 15 - 41 U/L - - -  ALT 14 - 54 U/L - - -   CBC Latest Ref Rng & Units 01/30/2017 01/29/2017 01/28/2017  WBC 4.0 - 10.5 K/uL 20.1(H) 16.7(H) 16.7(H)  Hemoglobin 12.0 - 15.0 g/dL 6.6(LL) 7.3(L) 7.5(L)  Hematocrit 36.0 - 46.0 % 21.9(L) 23.4(L) 24.5(L)  Platelets 150 - 400 K/uL 115(L) 71(L) 64(L)    Assessment/plan:  Acute on chronic combined CHF. Cardiogenic shock. A fib with RVR. Remains on pressors -cont pressor to maintain MAP >60, cont midodrine. Try to wean off pressor with lower MAP goal.  -cont amio, hold anticoag due to bleeding risk with recent SDH  Acute renal failure - off CRRT for last 24+ hours Hyperkalemia K 5.3 - trying to transition to intermittent HD, not sure her blood  pressure will tolerate this, if it does not then will need to discuss end of life care  - medically treat hyperK+ per nephro today.   VRE bacteremia. - f/u repeat blood culture from 4/19  Acute on chronic respiratory failure 2/2 to  Hx of COPD, OSA, OHS + CHF - prn BDs, bipap qHS - doing ok on [place] for now.   Dysphagia. - f/u with speech  Acute on Chronic Anemia - hgb 6.6 today Thrombocytopenia - improving  likely DIC - improving -1 unit pRBC today - f/u CBC  Relative adrenal insufficiency. - continue solu cortef while on pressors  Acute metabolic encephalopathy -resolved, was likely from uremia and hypotension Recent SDH  - monitor mental status  If she is not able to tolerate intermittent HD, then might need to transition to palliative care.  Dellia Nims, M.D.

## 2017-01-30 NOTE — Progress Notes (Signed)
CRITICAL VALUE ALERT  Critical value received:  Hemoglobin 6.6  Date of notification:  01/30/17  Time of notification:  06:25  Critical value read back:Yes.    Nurse who received alert:  Ezekiel Slocumb, RN  MD notified (1st page):  Warren Lacy MD  Time of first page:  0618  MD notified (2nd page): Warren Lacy MD  Time of second page: [phone]  Responding MD:  Dr. Oletta Darter  Time MD responded:  [phone]

## 2017-01-30 NOTE — Progress Notes (Signed)
RT NOTE:  Pt has order for BIPAP QHS, however, pt has Coretrak nasal tube in place that will prevent mask seal. BIPAP remains in room incase of emergency. Will attempt when nasal tube removed.

## 2017-01-30 NOTE — Progress Notes (Signed)
[facility] Progress Note Patient Name: Paige Marshall DOB: [birth year] MRN: [id number]   Date of Service  01/30/2017  HPI/Events of Note  Anemia - Hgb = 6.6.   eICU Interventions  Will transfuse 1 unit PRBC.        Sommer,Steven Cornelia Copa 01/30/2017, 6:34 AM

## 2017-01-31 ENCOUNTER — Inpatient Hospital Stay (HOSPITAL_COMMUNITY): Payer: Medicare Other

## 2017-01-31 LAB — CBC WITH DIFFERENTIAL/PLATELET
BASOS ABS: 0.2 10*3/uL — AB (ref 0.0–0.1)
Basophils Relative: 1 %
EOS ABS: 0 10*3/uL (ref 0.0–0.7)
Eosinophils Relative: 0 %
HCT: 24.9 % — ABNORMAL LOW (ref 36.0–46.0)
Hemoglobin: 7.8 g/dL — ABNORMAL LOW (ref 12.0–15.0)
Lymphocytes Relative: 9 %
Lymphs Abs: 1.5 10*3/uL (ref 0.7–4.0)
MCH: 26.9 pg (ref 26.0–34.0)
MCHC: 31.3 g/dL (ref 30.0–36.0)
MCV: 85.9 fL (ref 78.0–100.0)
MONO ABS: 1.3 10*3/uL — AB (ref 0.1–1.0)
Monocytes Relative: 8 %
NEUTROS ABS: 13.7 10*3/uL — AB (ref 1.7–7.7)
Neutrophils Relative %: 82 %
Platelets: 127 10*3/uL — ABNORMAL LOW (ref 150–400)
RBC: 2.9 MIL/uL — ABNORMAL LOW (ref 3.87–5.11)
RDW: 30.6 % — ABNORMAL HIGH (ref 11.5–15.5)
WBC: 16.7 10*3/uL — ABNORMAL HIGH (ref 4.0–10.5)

## 2017-01-31 LAB — RENAL FUNCTION PANEL
ALBUMIN: 2.3 g/dL — AB (ref 3.5–5.0)
ANION GAP: 15 (ref 5–15)
BUN: 94 mg/dL — AB (ref 6–20)
CALCIUM: 8.4 mg/dL — AB (ref 8.9–10.3)
CO2: 23 mmol/L (ref 22–32)
Chloride: 100 mmol/L — ABNORMAL LOW (ref 101–111)
Creatinine, Ser: 3.35 mg/dL — ABNORMAL HIGH (ref 0.44–1.00)
GFR calc Af Amer: 16 mL/min — ABNORMAL LOW (ref >60)
GFR calc non Af Amer: 14 mL/min — ABNORMAL LOW (ref >60)
GLUCOSE: 174 mg/dL — AB (ref 65–99)
PHOSPHORUS: 4.6 mg/dL (ref 2.5–4.6)
Potassium: 5.7 mmol/L — ABNORMAL HIGH (ref 3.5–5.1)
SODIUM: 138 mmol/L (ref 135–145)

## 2017-01-31 LAB — TYPE AND SCREEN
ABO/RH(D): A POS
Antibody Screen: NEGATIVE
Unit division: 0
Unit division: 0

## 2017-01-31 LAB — BPAM RBC
BLOOD PRODUCT EXPIRATION DATE: 201804292359
Blood Product Expiration Date: 201805102359
ISSUE DATE / TIME: 201804220802
ISSUE DATE / TIME: 201804221257
UNIT TYPE AND RH: 600
Unit Type and Rh: 6200

## 2017-01-31 LAB — GLUCOSE, CAPILLARY
GLUCOSE-CAPILLARY: 149 mg/dL — AB (ref 65–99)
GLUCOSE-CAPILLARY: 230 mg/dL — AB (ref 65–99)
GLUCOSE-CAPILLARY: 185 mg/dL — AB (ref 65–99)
Glucose-Capillary: 176 mg/dL — ABNORMAL HIGH (ref 65–99)

## 2017-01-31 LAB — CALCIUM, IONIZED: Calcium, Ionized, Serum: 4.4 mg/dL — ABNORMAL LOW (ref 4.5–5.6)

## 2017-01-31 LAB — PATHOLOGIST SMEAR REVIEW

## 2017-01-31 MED ORDER — GERHARDT'S BUTT CREAM
TOPICAL_CREAM | Freq: Every day | CUTANEOUS | Status: DC | PRN
  Administered 2017-02-07 (×2): 1 via TOPICAL
  Filled 2017-01-31 (×2): qty 1

## 2017-01-31 MED ORDER — NEPRO/CARBSTEADY PO LIQD
1000.0000 mL | ORAL | Status: DC
  Administered 2017-01-31: 1000 mL
  Filled 2017-01-31 (×4): qty 1000

## 2017-01-31 MED ORDER — PRO-STAT SUGAR FREE PO LIQD
30.0000 mL | Freq: Three times a day (TID) | ORAL | Status: DC
  Administered 2017-01-31 – 2017-02-01 (×3): 30 mL
  Filled 2017-01-31 (×3): qty 30

## 2017-01-31 NOTE — Consult Note (Addendum)
[facility] Nurse wound consult note Reason for Consult: Consult requested for buttocks.  Pt has frequently been incontinent of loose stool, and Flexiseal has been inserted to attempt to contain stool.  This is helpful, however, there is still leakage around the insertion site and it is difficult to keep affected areas from becoming soiled. Wound type: Bilat inner groin areas, upper anterior and posterior thighs, and bilat buttocks with patchy areas of partial thickness skin loss where previous blisters have ruptured, revealing pink moist skin and loose peeling edges, painful to touch.  Appearance is consistent with moisture associated skin damage. There are also a few scattered areas of darker-colored reddish purple skin; Each of these locations is approx 1X1cm or less.  Pressure Injury POA: Yes Dressing procedure/placement/frequency: Pt is on a low-airloss bed to decrease pressure and discomfort and increase airflow to the affected areas. Dr Lurline Del butt cream has already been ordered for topical treatment to repel moisture and promote healing. Antifungal powder can be applied over the layer of cream with each turning and cleaning episode.  Discussed plan of care with patient and she verbalized understanding. Please re-consult if further assistance is needed.  Thank-you,  Julien Girt MSN, [place], [place], [place], [place]

## 2017-01-31 NOTE — Progress Notes (Signed)
Patient has passed swallow evaluation and diet orders have been placed. Patient currently has tube feeds running, called eLink and spoke with MD Oletta Darter.  MD Oletta Darter advised to keep tube feeds running to allow assessment of patient's appetite and PO nutritional intake and rounding MD can assess during morning rounds for adjustment or discontinuation of tube feeds.

## 2017-01-31 NOTE — Progress Notes (Signed)
Modified Barium Swallow Progress Note  Patient Details  Name: Paige Marshall MRN: [id number] Date of Birth: 07-28-1956  Today's Date: 01/31/2017  Modified Barium Swallow completed.  Full report located under Chart Review in the Imaging Section.  Brief recommendations include the following:  Clinical Impression  Pt presents with much improved swallow function with adequate mastication, delay to the pyriforms for thin liquids, but no residue post-swallow and no penetration nor aspiration.  Pt taxed with successive, large thin liquid boluses, but maintained adequate airway protection.  Recommend resuming a regular consistency diet with thin liquids; give meds whole in puree.  NG may be discontinued.     Swallow Evaluation Recommendations       SLP Diet Recommendations: Regular solids;Thin liquid   Liquid Administration via: Cup;Straw   Medication Administration: Whole meds with puree   Supervision: Staff to assist with self feeding       Postural Changes: Seated upright at 90 degrees   Oral Care Recommendations: Oral care BID        [name] 01/31/2017,3:05 PM

## 2017-01-31 NOTE — Progress Notes (Signed)
Off CRRT since 4/21 5 AM. Breathing ok, no chest pain, no other complaints, remains hopeful for improvement of medical status.   BP (!) 81/54   Pulse 94   Temp 97.7 F (36.5 C) (Axillary)   Resp 14   Ht 5\' 8"  (1.727 m)   Wt 273 lb 5.9 oz (124 kg)   LMP 09/07/2011   SpO2 94%   BMI 41.57 kg/m   Vitals reviewed. General: resting in bed, NAD HEENT: PERRL, EOMI, no scleral icterus Cardiac: irregular  Pulm: clear to auscultation bilaterally, no wheezes Abd: soft, nontender, nondistended, BS present Ext: warm and well perfused, 1+ edema.  Neuro: alert and oriented X3, cranial nerves II-XII grossly intact, strength and sensation to light touch equal in bilateral upper and lower extremities  CMP Latest Ref Rng & Units 01/31/2017 01/30/2017 01/30/2017  Glucose 65 - 99 mg/dL 174(H) 190(H) 195(H)  BUN 6 - 20 mg/dL 94(H) 78(H) 62(H)  Creatinine 0.44 - 1.00 mg/dL 3.35(H) 2.92(H) 2.41(H)  Sodium 135 - 145 mmol/L 138 139 138  Potassium 3.5 - 5.1 mmol/L 5.7(H) 5.6(H) 5.3(H)  Chloride 101 - 111 mmol/L 100(L) 102 100(L)  CO2 22 - 32 mmol/L 23 23 23   Calcium 8.9 - 10.3 mg/dL 8.4(L) 8.3(L) 8.3(L)  Total Protein 6.5 - 8.1 g/dL - - -  Total Bilirubin 0.3 - 1.2 mg/dL - - -  Alkaline Phos 38 - 126 U/L - - -  AST 15 - 41 U/L - - -  ALT 14 - 54 U/L - - -   CBC Latest Ref Rng & Units 01/31/2017 01/30/2017 01/29/2017  WBC 4.0 - 10.5 K/uL 16.7(H) 20.1(H) 16.7(H)  Hemoglobin 12.0 - 15.0 g/dL 7.8(L) 6.6(LL) 7.3(L)  Hematocrit 36.0 - 46.0 % 24.9(L) 21.9(L) 23.4(L)  Platelets 150 - 400 K/uL PENDING 115(L) 71(L)    Assessment/plan:  Acute on chronic combined CHF. Cardiogenic shock. A fib with RVR. Remains on pressors but coming down.  -cont pressor to maintain MAP >60, cont midodrine.  -cont amio, hold anticoag due to bleeding risk with recent SDH  Acute renal failure - off CRRT since 4/21 5 AM. Hyperkalemia K 5.6, crt worsening. -Getting tube feed containing high K+, will ask dietician to change to  nephro tube feed, appreciate RN pointing this out. - medically treat hyperK+ per nephro today. - GOC discussion today as our options are limited as she cannot remain on pressor indefinitely and her BP will not support intermittent HD.   VRE bacteremia. - f/u repeat blood culture from 4/19  Acute on chronic respiratory failure 2/2 to  Hx of COPD, OSA, OHS + CHF - prn BDs, bipap qHS - doing ok on [place] for now.   Dysphagia. - f/u with speech  Acute on Chronic Anemia - hgb 6.6 today Thrombocytopenia - improving  likely DIC - improving -1 unit pRBC today - f/u CBC  Relative adrenal insufficiency. - continue solu cortef while on pressors  Acute metabolic encephalopathy -resolved, was likely from uremia and hypotension Recent SDH  - monitor mental status   Dellia Nims, M.D.   Attending Note:  61 year old female with cardiogenic shock who presents to PCCM for hypotension requiring levophed and vasopressin and CRRT.  Now off CRRT pending ability to tolerate HD which has not been effective.  On exam, bibasilar crackles noted.  I reviewed CXR myself bibasilar opacities noted.  Discussed with bedside RN.  Spoke with patient at length, unable to tolerate dialysis.  Will attempt HD one more time.  After discussion, decision was made to make patient LCB with no CPR, cardioversion or CPR.  Continue pressor support for now and if unable to tolerate dialysis then will call palliative care.  If able to tolerate then will need to discuss plan of care.  The patient is critically ill with multiple organ systems failure and requires high complexity decision making for assessment and support, frequent evaluation and titration of therapies, application of advanced monitoring technologies and extensive interpretation of multiple databases.   Critical Care Time devoted to patient care services described in this note is  35  Minutes. This time reflects time of care of this signee Dr Jennet Maduro. This  critical care time does not reflect procedure time, or teaching time or supervisory time of PA/NP/Med student/Med Resident etc but could involve care discussion time.  Rush Farmer, M.D. [facility] Pulmonary/Critical Care Medicine. Pager: [phone]. After hours pager: [phone].

## 2017-01-31 NOTE — Progress Notes (Addendum)
Nutrition Consult/Follow Up  DOCUMENTATION CODES:   Morbid obesity  INTERVENTION:   D/C Vital AF 1.2 formula   Initiate Nepro at goal rate of 40 ml/h (960 ml per day) and Prostat 30 ml TID  TF regimen to provide 2028 kcals, 123 gm protein, 698 ml free water daily  NUTRITION DIAGNOSIS:   Inadequate oral intake now related to moderate-severe dysphagia as evidenced by NPO status, ongoing  GOAL:   Patient will meet greater than or equal to 90% of their needs, met   MONITOR:   TF tolerance, Diet advancement, Labs, Weight trends, I & O's  ASSESSMENT:   60-year old woman with poorly controlled OHS, chronic combined systolic and diastolic CHF, a-fib/a-flutter on chronic anticoagulation, hypertension, COPD, CKD Stage 3. Admitted to ED from Rehab on 3/18 for  acute decompensated CHF complicated by cardiorenal syndrome. During stay developed acute on chronic kidney disease requiring CRRT, now being followed by nephrology for PRN HD. Blood culture positive for VRE bacteremia being treated with ampicillin and ceftriaxone. Transferred to step-down on 4/11. On 4/12 noted to be more lethargic and aphasic. CT Head revealed mixed density mass like area along the surface of the anterior frontal lobe with cytotoxic edema as well as a rounded 2 cm hemorrhage.   Pt developed altered mental status and aphasia 4/12.  Transferred from 3W-CPCU to 2H-Cardiovascular ICU.  Vital AF 1.2 infusing at goal rate of 70 ml/hr via CORTRAK small bore feeding tube (tip at ligament of Treitz). Nephrology following for acute oliguric kidney injury.  Off CRRT since 4/21. RD consulted to change TF formula to Nepro. Labs reviewed.  Potassium 5.7 (H). CBG's 149-176-230.  Diet Order:  Diet NPO time specified  Skin:  Wound (see comment) (Bilat inner groin areas, upper anterior and posterior thighs, and bilat buttocks with patchy areas of partial thickness skin loss)  Last BM:  4/23  Height:   Ht Readings from Last  1 Encounters:  01/21/17 5' 8" (1.727 m)   Weight:   Wt Readings from Last 1 Encounters:  01/26/17 273 lb 5.9 oz (124 kg)   Ideal Body Weight:  63.6 kg  BMI:  Body mass index is 41.57 kg/m.  Estimated Nutritional Needs:   Kcal:  1900-2100  Protein:  120-130 gm  Fluid:  per MD  EDUCATION NEEDS:   No education needs identified at this time  Katie , RD, LDN Pager #: [phone] After-Hours Pager #: [phone]  

## 2017-01-31 NOTE — Plan of Care (Signed)
Problem: Education: Goal: Knowledge of disease and its progression will improve Outcome: Progressing Education ongoning  Problem: Physical Regulation: Goal: Complications related to the disease process or treatment will be avoided or minimized Outcome: Progressing Pt tolerated IHD 01/31/2017. Neuro status intact at this time Goal: Dialysis access will remain free of complications Outcome: Progressing Remains on pressors

## 2017-01-31 NOTE — Progress Notes (Signed)
Advanced Heart Failure Rounding Note  PCP: Juluis Mire, NP  Primary Cardiologist: Dr. Gwenlyn Found   Subjective:    s/p failed DCCV 3/28 and 4/5.   01/20/17 - successful DCCV to NSR, lethargic and with garbled speech. CT head showed left frontal intracranial hemorrhage.   01/21/17 MRI head with small left frontal intracerebral hemorrhage with parafalcine small SDH, trace rightward midline shift. No CVA. Intubated for airway control.   CVVHD stopped 01/16/17, restarted on 4/14.  Went back into Afib 4/16.   Feels OK this am. Left hand remains somewhat tender.  Denies SOB. Depressed. Says she is just taking it day by day.   Pressures soft. Weaned off nor-epi overnight but pressures dropped to 60-70s. Back on 1 norepi and 0.03 vasopressin this am.   Creatinine climbing on CVVHD. Potassium 5.7 this am.   Remains in AF on IV amio. No heparin due to [place].   Objective:   Weight Range: 273 lb 5.9 oz (124 kg) Body mass index is 41.57 kg/m.   Vital Signs:   Temp:  [97.6 F (36.4 C)-98 F (36.7 C)] 97.7 F (36.5 C) (04/23 0400) Pulse Rate:  [25-119] 94 (04/23 0715) Resp:  [14-25] 14 (04/23 0715) BP: (65-125)/(45-94) 81/54 (04/23 0715) SpO2:  [0 %-100 %] 94 % (04/23 0715) Last BM Date: 01/30/17  Weight change: Filed Weights   01/24/17 0200 01/25/17 0500 01/26/17 0500  Weight: 288 lb 12.8 oz (131 kg) 279 lb 15.8 oz (127 kg) 273 lb 5.9 oz (124 kg)    Intake/Output:   Intake/Output Summary (Last 24 hours) at 01/31/17 0736 Last data filed at 01/31/17 0700  Gross per 24 hour  Intake          3628.08 ml  Output                0 ml  Net          3628.08 ml     Physical Exam: General: Obese Lying in bed. NAD.  HEENT: Anicteric. +NGT.  Neck: Supple. JVP ok Carotids 2+ bilat; no bruits. No thyromegaly or nodule noted. Cor: PMI non-palpable. Irr irregular. + S3. Tunneled HD cath Lungs: Diminished but clear.  Abdomen: Obese, soft, NT, ND, no HSM. No bruits or masses. +BS.   Extremities: Warm. Trace  edema.  Left hand with mild cyanosis, Left index finger worse than others.   edema.  Neuro: Aware and alert. Moves all 4 extremities. Depressed affect.   Telemetry: Personally reviewed, Afib 90-100s   Labs: CBC  Recent Labs  01/30/17 0433 01/31/17 0515  WBC 20.1* 16.7*  NEUTROABS 16.3* PENDING  HGB 6.6* 7.8*  HCT 21.9* 24.9*  MCV 86.6 85.9  PLT 115* PENDING   Basic Metabolic Panel  Recent Labs  01/30/17 1557 01/31/17 0506  NA 139 138  K 5.6* 5.7*  CL 102 100*  CO2 23 23  GLUCOSE 190* 174*  BUN 78* 94*  CREATININE 2.92* 3.35*  CALCIUM 8.3* 8.4*  PHOS 3.4 4.6   Liver Function Tests  Recent Labs  01/30/17 1557 01/31/17 0506  ALBUMIN 2.2* 2.3*    BNP: BNP (last 3 results)  Recent Labs  09/18/16 1126 11/07/16 1633 12/26/16 1643  BNP 991.0* 490.4* 1,116.0*    Transthoracic Echocardiography 01/11/17 Study Conclusions  - Left ventricle: The cavity size was mildly dilated. Wall   thickness was increased in a pattern of mild LVH. Left   ventricular geometry showed evidence of eccentric hypertrophy.   Systolic function  was moderately reduced. The estimated ejection   fraction was in the range of 35% to 40%. Moderate diffuse   hypokinesis with no identifiable regional variations. Acoustic   contrast opacification revealed no evidence ofthrombus. - Ventricular septum: The contour showed diastolic flattening.   These changes are consistent with RV volume overload. - Aortic valve: There was mild to moderate regurgitation directed   centrally in the LVOT. - Mitral valve: There was mild to moderate regurgitation directed   eccentrically and posteriorly. - Left atrium: The atrium was moderately dilated. - Right ventricle: The cavity size was severely dilated. Wall   thickness was normal. Systolic function was mildly reduced. - Right atrium: The atrium was severely dilated. - Tricuspid valve: There was malcoaptation of the valve  leaflets.   There was moderate-severe regurgitation. - Pulmonary arteries: Systolic pressure was severely increased. PA   peak pressure: 71 mm Hg (S). - Pericardium, extracardiac: A trivial pericardial effusion was   identified posterior to the heart.    Medications:     Scheduled Medications: . Chlorhexidine Gluconate Cloth  6 each Topical Daily  . darbepoetin (ARANESP) injection - NON-DIALYSIS  150 mcg Subcutaneous Q Mon-1800  . hydrocortisone sod succinate (SOLU-CORTEF) inj  50 mg Intravenous Q6H  . insulin aspart  2-6 Units Subcutaneous Q4H  . mouth rinse  15 mL Mouth Rinse BID  . midodrine  15 mg Oral TID WC  . pantoprazole sodium  40 mg Per Tube Q24H    Infusions: . sodium chloride Stopped (01/24/17 0330)  . amiodarone 30 mg/hr (01/31/17 0700)  . feeding supplement (VITAL AF 1.2 CAL) 1,000 mL (01/31/17 0700)  . norepinephrine (LEVOPHED) Adult infusion 0.96 mcg/min (01/31/17 0700)  . vasopressin (PITRESSIN) infusion - *FOR SHOCK* 0.03 Units/min (01/31/17 0700)    PRN Medications: Place/Maintain arterial line **AND** sodium chloride, acetaminophen (TYLENOL) oral liquid 160 mg/5 mL, diphenhydrAMINE-zinc acetate, Gerhardt's butt cream, heparin, ondansetron (ZOFRAN) IV, senna-docusate   Assessment/Plan   1. Acute on chronic combined systolic and diastolic CHF: EF 45-80% on TEE 01/13/17 (down from 55% in March 2018 when first admitted). Likely tachy mediated from atrial fibrillation.  - She has biventricular failure R>>L.  - Now off CVVHD for 3 days. Volume status continues to rise.   - Remains on dual pressors. Failed wean over weekend.  - Continue midodrine 15 mg TID.  2. Left frontal ICD with parafalcine SDH: Trace right midline shift. No CVA. Per Neuro her small ICH/SDH does not explain the degree of her AMS. Likely a component of metabolic encephalopathy due to renal failure. - Neuro status stable. Remains off AC.  3. PAF: Failed DCCV 3/27, 4/5. Had DCCV with return to  NSR on 01/20/17, went back into Afib 01/24/17.  - Rate stable this am. Continue IV amio.  - No longer a candidate for DCCV as she is off anticoagulation due to [place]  4. Acute on chronic kidney disease: - No evidence of renal recovery. CVVHD on hold.  - Continue to require high pressor support which may preclude her from IHD. If unable to tolerate iHD after letting CVP climb, palliative care will be only option. Pt aware.  5. Obesity hypoventilation syndrome 6. Septic shock:   - Completed ABX  01/26/17.   - Repeat BCx NGTD. Afebrile.  7. 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  - Severe. Possibly end stage.   8. Acute respiratory failure: - Extubated. Respiratory status ok for now.  - On 4L [place] 9.  Anemia:  - Hgb 7.8 s/p 2 UPRBCs 01/30/17.  10. NSVT:  - 1 3 beat run noted. Otherwise quiescent.  - Continue IV amio  11. Thrombocytopenia: 2/2 DIC - SR assay negative -PLTs pending this am. No overt bleeding.   12. Tube feeds  -seems to have recovered from stroke. Will ask speech if we can pull NGT - per RN is not on usual feed that renal patients get. ? If this is contributing to hyperkalemia 13. Hyperkalemia - IM and RN to speak to nutrition about potassium content in her current tube feed.  - Discussed with pharm-D. Current supplement listed as 400 mg of K (less than 10 meq) per 8 oz. Getting around 10 meq every 4 hours. So likely 60 to 80 meq daily.  - Ultimately, HD would help with this, but pressure remains soft.  - OK to continue kayexalate.   Patient is critically ill and remains in imminent danger of multiple organ failure. Overall very poor prognosis.   Length of Stay: [address]  Annamaria Helling  01/31/2017, 7:36 AM Advanced Heart Failure Team  Pager [phone] M-F 7am-4pm.  Please contact [facility] for night-coverage after hours (4p -7a ) and weekends on [email or web address]  Agree with above.  She remains very tenuous. Still on Norepi 2 and  vasopressin. SBP 80-90s. Unable to wean further. Renal is going to trial her on iHD today and see if she can tolerate. If unable to tolerate will need to switch to comfort care. If tolerates will need to then attempt to wean inotropes. Midodrine increased to 15 tid yesterday.   Remains in AF but rate well controlled on IV amiodarone. Off anticoagulation due to [place].  Failed swallow study last week. Going down for barium swallow today. Discussed with Speech.   On exam looks fatigued but comfortable.  Volume status looks ok.  Remains in AF but rate controlled.  Edema improved  Will wait to see if she how she does with iHD. If unable to tolerate will need to switch to comfort care.  The patient is critically ill with multiple organ systems failure and requires high complexity decision making for assessment and support, frequent evaluation and titration of therapies, application of advanced monitoring technologies and extensive interpretation of multiple databases.   Critical Care Time devoted to patient care services described in this note is 35 Minutes. This time reflects time of care of this signee, Dr. Pierre Bali who personally examined the patient and determined the plan of care. This critical care time does not reflect procedure time, teaching time or supervisory time of our PA/NP/resident but could involve care discussion and coordination time.  Glori Bickers, MD  4:48 PM

## 2017-01-31 NOTE — Progress Notes (Signed)
Arrived to start dialysis patient being prepared to have a swallow study performed off unit.

## 2017-01-31 NOTE — Progress Notes (Signed)
[facility] Progress Note    Assessment/ Plan:   1. Acute oliguric kidney injury: due to persistent shock. Still on pressors but requirement is coming down. CRRT stopped at 0500 on 4/21. CVP increased from 14 yesterday to 16 today. Appears to be mildly volume overloaded. Creatinine trending up. Patient "is not ready to give up yet". - Trial of HD today  2. Hyperkalemia: related to renal failure and pRBCs 4/22. K 5.7 today. s/p Patiromer x 1 and Kayexalate x 1 on 4/22. - Will improve after HD - Check renal function panel in the morning  3. Paroxysmal Afib / acute on chronic combined systolic and diastolic HF - HF team following - Continue amio gtt - Continue Midodrine 15mg  tid  4. Persistent shock s/p VRE bacteremia - On midodrine, pressors, and stress dose steroids - Completed abx 01/26/17, repeat blood cultures 4/19 NGTD  5.  SDH: stable - Not a candidate for anticoagulation  6.  Anemia: s/p 2 units pRBCs 4/22. Hgb 7.8 today. - Aranesp q Monday  7.  Thrombocytopenia- likely DIC, improving.  HIT ab + but assay neg   Subjective:    States she is doing well. No shortness of breath. Feels like the swelling is getting worse in her arms and legs. States she is "not ready to give up yet".    Objective:   BP (!) 103/49   Pulse 97   Temp 98 F (36.7 C) (Oral)   Resp 19   Ht 5\' 8"  (1.727 m)   Wt 273 lb 5.9 oz (124 kg)   LMP 09/07/2011   SpO2 93%   BMI 41.57 kg/m   Intake/Output Summary (Last 24 hours) at 01/31/17 1315 Last data filed at 01/31/17 1200  Gross per 24 hour  Intake          3002.98 ml  Output                0 ml  Net          3002.98 ml   Weight change:   Physical Exam: Gen: Pleasant, in NAD CVS: Irregularly irregular rhythm, normal rate Resp: Mild inspiratory wheezing in the right anterior lung fields, mild bibasilar crackles present Abd: obese, soft, non-tender Ext: Symmetric bilateral non-pitting edema ACCESS: R TDC in place,  dressing c/d/intact.  Imaging: No results found.  Labs: BMET  Recent Labs Lab 01/28/17 0420 01/28/17 1616 01/29/17 0429 01/29/17 1554 01/30/17 0433 01/30/17 1557 01/31/17 0506  NA 137 140 137 138 138 139 138  K 4.1 4.4 4.6 5.2* 5.3* 5.6* 5.7*  CL 102 104 100* 103 100* 102 100*  CO2 22 20* 27 21* 23 23 23   GLUCOSE 167* 137* 191* 135* 195* 190* 174*  BUN 27* 25* 27* 40* 62* 78* 94*  CREATININE 1.18* 1.14* 1.14* 1.86* 2.41* 2.92* 3.35*  CALCIUM 7.9* 7.8* 8.1* 8.2* 8.3* 8.3* 8.4*  PHOS 2.0* 1.5* 1.8* 2.2* 2.8 3.4 4.6   CBC  Recent Labs Lab 01/28/17 0420 01/29/17 0429 01/30/17 0433 01/31/17 0515  WBC 16.7* 16.7* 20.1* 16.7*  NEUTROABS 13.3* 13.1* 16.3* 13.7*  HGB 7.5* 7.3* 6.6* 7.8*  HCT 24.5* 23.4* 21.9* 24.9*  MCV 86.6 88.6 86.6 85.9  PLT 64* 71* 115* 127*    Medications:    . Chlorhexidine Gluconate Cloth  6 each Topical Daily  . darbepoetin (ARANESP) injection - NON-DIALYSIS  150 mcg Subcutaneous Q Mon-1800  . hydrocortisone sod succinate (SOLU-CORTEF) inj  50 mg Intravenous Q6H  . insulin aspart  2-6 Units Subcutaneous Q4H  . mouth rinse  15 mL Mouth Rinse BID  . midodrine  15 mg Oral TID WC  . pantoprazole sodium  40 mg Per Tube Q24H      Hyman Bible, MD PGY-2 Family Medicine Resident 01/31/2017, 1:15 PM   I have seen and examined this patient and agree with plan and assessment in the above note with renal recommendations/intervention highlighted by Dr. Brett Albino. Mrs. Tranchina remains very alert and interactive despite her ongoing cardiogenic shock.  She has expressed clearly that she would like to attempt intermittent HD and will plan for HD today and assess her response.  If she does not tolerate HD, will need to reassess her goals of care. Governor Rooks Belina Mandile,MD 01/31/2017 2:32 PM

## 2017-02-01 DIAGNOSIS — R570 Cardiogenic shock: Secondary | ICD-10-CM

## 2017-02-01 LAB — CBC WITH DIFFERENTIAL/PLATELET
BASOS ABS: 0.2 10*3/uL — AB (ref 0.0–0.1)
Basophils Relative: 1 %
EOS PCT: 0 %
Eosinophils Absolute: 0 10*3/uL (ref 0.0–0.7)
HEMATOCRIT: 25.8 % — AB (ref 36.0–46.0)
HEMOGLOBIN: 7.7 g/dL — AB (ref 12.0–15.0)
LYMPHS PCT: 6 %
Lymphs Abs: 1.1 10*3/uL (ref 0.7–4.0)
MCH: 26.5 pg (ref 26.0–34.0)
MCHC: 29.8 g/dL — ABNORMAL LOW (ref 30.0–36.0)
MCV: 88.7 fL (ref 78.0–100.0)
MONOS PCT: 8 %
Monocytes Absolute: 1.5 10*3/uL — ABNORMAL HIGH (ref 0.1–1.0)
Neutro Abs: 15.9 10*3/uL — ABNORMAL HIGH (ref 1.7–7.7)
Neutrophils Relative %: 85 %
Platelets: 198 10*3/uL (ref 150–400)
RBC: 2.91 MIL/uL — AB (ref 3.87–5.11)
RDW: 33.8 % — ABNORMAL HIGH (ref 11.5–15.5)
WBC: 18.7 10*3/uL — ABNORMAL HIGH (ref 4.0–10.5)

## 2017-02-01 LAB — CULTURE, BLOOD (ROUTINE X 2)
Culture: NO GROWTH
Culture: NO GROWTH
SPECIAL REQUESTS: ADEQUATE
SPECIAL REQUESTS: ADEQUATE

## 2017-02-01 LAB — BASIC METABOLIC PANEL
Anion gap: 16 — ABNORMAL HIGH (ref 5–15)
BUN: 67 mg/dL — ABNORMAL HIGH (ref 6–20)
CHLORIDE: 98 mmol/L — AB (ref 101–111)
CO2: 25 mmol/L (ref 22–32)
Calcium: 8.3 mg/dL — ABNORMAL LOW (ref 8.9–10.3)
Creatinine, Ser: 2.82 mg/dL — ABNORMAL HIGH (ref 0.44–1.00)
GFR calc non Af Amer: 17 mL/min — ABNORMAL LOW (ref >60)
GFR, EST AFRICAN AMERICAN: 20 mL/min — AB (ref >60)
Glucose, Bld: 107 mg/dL — ABNORMAL HIGH (ref 65–99)
POTASSIUM: 4.3 mmol/L (ref 3.5–5.1)
SODIUM: 139 mmol/L (ref 135–145)

## 2017-02-01 LAB — RENAL FUNCTION PANEL
ALBUMIN: 2.4 g/dL — AB (ref 3.5–5.0)
ANION GAP: 17 — AB (ref 5–15)
BUN: 70 mg/dL — AB (ref 6–20)
CHLORIDE: 98 mmol/L — AB (ref 101–111)
CO2: 25 mmol/L (ref 22–32)
Calcium: 8.2 mg/dL — ABNORMAL LOW (ref 8.9–10.3)
Creatinine, Ser: 2.83 mg/dL — ABNORMAL HIGH (ref 0.44–1.00)
GFR calc Af Amer: 20 mL/min — ABNORMAL LOW (ref >60)
GFR calc non Af Amer: 17 mL/min — ABNORMAL LOW (ref >60)
GLUCOSE: 107 mg/dL — AB (ref 65–99)
PHOSPHORUS: 4.6 mg/dL (ref 2.5–4.6)
POTASSIUM: 4.4 mmol/L (ref 3.5–5.1)
Sodium: 140 mmol/L (ref 135–145)

## 2017-02-01 LAB — GLUCOSE, CAPILLARY
GLUCOSE-CAPILLARY: 125 mg/dL — AB (ref 65–99)
GLUCOSE-CAPILLARY: 205 mg/dL — AB (ref 65–99)
Glucose-Capillary: 102 mg/dL — ABNORMAL HIGH (ref 65–99)
Glucose-Capillary: 109 mg/dL — ABNORMAL HIGH (ref 65–99)
Glucose-Capillary: 133 mg/dL — ABNORMAL HIGH (ref 65–99)
Glucose-Capillary: 121 mg/dL — ABNORMAL HIGH (ref 65–99)

## 2017-02-01 LAB — PHOSPHORUS: PHOSPHORUS: 4.6 mg/dL (ref 2.5–4.6)

## 2017-02-01 LAB — MAGNESIUM: MAGNESIUM: 2.3 mg/dL (ref 1.7–2.4)

## 2017-02-01 LAB — CALCIUM, IONIZED: Calcium, Ionized, Serum: 4.3 mg/dL — ABNORMAL LOW (ref 4.5–5.6)

## 2017-02-01 MED ORDER — AMIODARONE HCL 200 MG PO TABS
200.0000 mg | ORAL_TABLET | Freq: Two times a day (BID) | ORAL | Status: DC
  Administered 2017-02-01 – 2017-02-08 (×14): 200 mg via ORAL
  Filled 2017-02-01 (×14): qty 1

## 2017-02-01 MED ORDER — PANTOPRAZOLE SODIUM 40 MG PO TBEC
40.0000 mg | DELAYED_RELEASE_TABLET | Freq: Every day | ORAL | Status: DC
Start: 2017-02-01 — End: 2017-02-10
  Administered 2017-02-01 – 2017-02-10 (×9): 40 mg via ORAL
  Filled 2017-02-01 (×9): qty 1

## 2017-02-01 NOTE — Progress Notes (Signed)
[facility] Progress Note    Assessment/ Plan:   1. Oliguric AKI: due to persistent shock. Tolerated HD yesterday with 1.5L removed. Was able to come off Levophed last night, but back on Levophed this morning for MAPs in the 50s. Still on Vasopressin as well. - Will try another HD session 4/25. - Can hopefully wean pressors today  2. Paroxysmal Afib / acute on chronic combined systolic and diastolic HF - HF team following - Switch from amiodarone gtt to PO today, as patient has passed swallow eval - Continue Midodrine 15mg  tid  3. Persistent shock s/p VRE bacteremia - On midodrine, pressors, and stress dose steroids - Completed abx 01/26/17, repeat blood cultures 4/19 NGTD  4.  SDH: stable - Not a candidate for anticoagulation  5.  Anemia: s/p 2 units pRBCs 4/22. Hgb stable. - Aranesp q Monday  5.  Thrombocytopenia- likely DIC, improving.  HIT ab + but assay neg   Subjective:    Patient states she did well in HD yesterday. No dizziness, no shortness of breath. She feels well this morning.   Objective:   BP (!) 89/70   Pulse 98   Temp 98 F (36.7 C) (Oral)   Resp 16   Ht 5\' 8"  (1.727 m)   Wt 271 lb 2.7 oz (123 kg)   LMP 09/07/2011   SpO2 93%   BMI 41.23 kg/m   Intake/Output Summary (Last 24 hours) at 02/01/17 0835 Last data filed at 02/01/17 0617  Gross per 24 hour  Intake          1650.21 ml  Output             1800 ml  Net          -149.79 ml   Weight change:   Physical Exam: Gen: Pleasant, in NAD CVS: Irregularly irregular rhythm, normal rate Resp: Mild inspiratory wheezing in the right anterior lung fields, mild bibasilar crackles present Abd: obese, soft, non-tender Ext: Symmetric bilateral non-pitting edema ACCESS: R TDC in place, dressing c/d/intact.  Imaging: Dg Swallowing Func-speech Pathology  Result Date: 01/31/2017 Objective Swallowing Evaluation: Type of Study: MBS-Modified Barium Swallow Study Patient Details Name: [facility] MRN: [id number] Date of Birth: 1956-06-26 Today's Date: 01/31/2017 Time: SLP Start Time (ACUTE ONLY): 1350-SLP Stop Time (ACUTE ONLY): 1450 SLP Time Calculation (min) (ACUTE ONLY): 60 min Past Medical History: Past Medical History: Diagnosis Date . Asthma  . Chronic combined systolic and diastolic CHF (congestive heart failure) ([place])  . CKD (chronic kidney disease), stage III  . COPD (chronic obstructive pulmonary disease) ([place])  . Degenerative joint disease of both lower legs  . Former smoker  . Gout   hx in "both knees" (01/14/2015) . H. pylori infection 01/03/2014  +breath test.   . High cholesterol  . Hypertension  . Morbid obesity ([place])  . NICM (nonischemic cardiomyopathy) ([place])   a. LHC 01/2015: normal cors, EF 45-50%. b. EF 50-55% in 08/2016. c. EF 33% 01/2017 . OSA (obstructive sleep apnea)  . Persistent atrial fibrillation ([place])  . Pneumonia X 2 . Pulmonary hypertension ([place])  . Rheumatoid arthritis ([place])  . Stroke [facility])  Past Surgical History: Past Surgical History: Procedure Laterality Date . ANGIOGRAM/LV (CONGENITAL)  2007 . BREATH TEK H PYLORI N/A 12/31/2013  Procedure: [place];  Surgeon: Gayland Curry, MD;  Location: [facility];  Service: General;  Laterality: N/A; . CARDIOVERSION N/A 01/13/2017  Procedure: CARDIOVERSION;  Surgeon: Jolaine Artist, MD;  Location: [place];  Service: Cardiovascular;  Laterality: N/A; . CARDIOVERSION N/A 01/20/2017  Procedure: CARDIOVERSION;  Surgeon: Larey Dresser, MD;  Location: [place];  Service: Cardiovascular;  Laterality: N/A; . CORNEAL TRANSPLANT Right ~ 2010 . CORONARY ANGIOPLASTY WITH STENT PLACEMENT  11/04/2005  "1" . DOPPLER ECHOCARDIOGRAPHY    2 D   EF of 45% . EYE SURGERY   . IR GENERIC HISTORICAL  01/06/2017  IR FLUORO GUIDE CV LINE RIGHT 01/06/2017 Corrie Mckusick, DO [facility] . IR GENERIC HISTORICAL  01/06/2017  IR US GUIDE VASC ACCESS RIGHT 01/06/2017 Corrie Mckusick, DO [facility] . LEFT HEART CATHETERIZATION WITH CORONARY  ANGIOGRAM N/A 01/20/2015  Procedure: LEFT HEART CATHETERIZATION WITH CORONARY ANGIOGRAM;  Surgeon: Lorretta Harp, MD;  [facility] LAB;  Service: Cardiovascular;  Laterality: N/A; . RIGHT HEART CATH N/A 01/07/2017  Procedure: Right Heart Cath;  Surgeon: Jolaine Artist, MD;  Location: [place] CV LAB;  Service: Cardiovascular;  Laterality: N/A; . sleep study  2011 . stress myocardial dipyridamole perfusion   . TEE WITHOUT CARDIOVERSION N/A 01/13/2017  Procedure: TRANSESOPHAGEAL ECHOCARDIOGRAM (TEE);  Surgeon: Jolaine Artist, MD;  Location: [place];  Service: Cardiovascular;  Laterality: N/A; . [place] . venous duplex ultrasound  6736 HPI: 61 year old woman with poorly controlled OHS, chronic combined systolic and diastolic CHF, a-fib/a-flutter on chronic anticoagulation, hypertension, COPD, CKD Stage 3. Admitted to ED from [place] on 3/18 foracute decompensated CHF complicated by cardiorenal syndrome. During stay developed acute on chronic kidney disease requiring CRRT, now being followed by nephrology for PRN HD. Blood culture positive for VRE bacteremia being treated with ampicillin and ceftriaxone. Transferred to step-down on 4/11. On 4/12 noted to be more lethargic and aphasic. CT Head revealed mixed density mass like area along the surface of the anterior frontal lobe with cytotoxic edema as well as a rounded 2 cm hemorrhage.ETT 4/13-4/18. Initial FEES 01/27/17 with recs for NPO.   Subjective: pleasant, communicative Assessment / Plan / Recommendation CHL IP CLINICAL IMPRESSIONS 01/31/2017 Clinical Impression Pt presents with much improved swallow function with adequate mastication, delay to the pyriforms for thin liquids, but no residue post-swallow and no penetration nor aspiration.  Pt taxed with successive, large thin liquid boluses, but maintained adequate airway protection.  Recommend resuming a regular consistency diet with thin liquids; give meds whole in puree.  NG may be  discontinued.   SLP Visit Diagnosis Dysphagia, oropharyngeal phase (R13.12) Attention and concentration deficit following -- Frontal lobe and executive function deficit following -- Impact on safety and function No limitations   CHL IP TREATMENT RECOMMENDATION 01/27/2017 Treatment Recommendations Therapy as outlined in treatment plan below   Prognosis 01/27/2017 Prognosis for Safe Diet Advancement Good Barriers to Reach Goals Cognitive deficits Barriers/Prognosis Comment -- CHL IP DIET RECOMMENDATION 01/31/2017 SLP Diet Recommendations Regular solids;Thin liquid Liquid Administration via Cup;Straw Medication Administration Whole meds with puree Compensations -- Postural Changes Seated upright at 90 degrees   CHL IP OTHER RECOMMENDATIONS 01/31/2017 Recommended Consults -- Oral Care Recommendations Oral care BID Other Recommendations --   CHL IP FOLLOW UP RECOMMENDATIONS 01/28/2017 Follow up Recommendations Inpatient Rehab   CHL IP FREQUENCY AND DURATION 01/31/2017 Speech Therapy Frequency (ACUTE ONLY) min 1 x/week Treatment Duration 1 week      CHL IP ORAL PHASE 01/31/2017 Oral Phase WFL Oral - Pudding Teaspoon -- Oral - Pudding Cup -- Oral - Honey Teaspoon -- Oral - Honey Cup -- Oral - Nectar Teaspoon -- Oral - Nectar  Cup -- Oral - Nectar Straw -- Oral - Thin Teaspoon -- Oral - Thin Cup -- Oral - Thin Straw -- Oral - Puree -- Oral - Mech Soft -- Oral - Regular -- Oral - Multi-Consistency -- Oral - Pill -- Oral Phase - Comment --  CHL IP PHARYNGEAL PHASE 01/31/2017 Pharyngeal Phase WFL Pharyngeal- Pudding Teaspoon -- Pharyngeal -- Pharyngeal- Pudding Cup -- Pharyngeal -- Pharyngeal- Honey Teaspoon NT Pharyngeal -- Pharyngeal- Honey Cup -- Pharyngeal -- Pharyngeal- Nectar Teaspoon WFL Pharyngeal -- Pharyngeal- Nectar Cup -- Pharyngeal -- Pharyngeal- Nectar Straw -- Pharyngeal -- Pharyngeal- Thin Teaspoon WFL Pharyngeal -- Pharyngeal- Thin Cup WFL Pharyngeal -- Pharyngeal- Thin Straw -- Pharyngeal -- Pharyngeal- Puree WFL  Pharyngeal -- Pharyngeal- Mechanical Soft -- Pharyngeal -- Pharyngeal- Regular -- Pharyngeal -- Pharyngeal- Multi-consistency -- Pharyngeal -- Pharyngeal- Pill -- Pharyngeal -- Pharyngeal Comment --  No flowsheet data found. No flowsheet data found. [name] 01/31/2017, 3:03 PM               Labs: BMET  Recent Labs Lab 01/29/17 0429 01/29/17 1554 01/30/17 0433 01/30/17 1557 01/31/17 0506 02/01/17 0416 02/01/17 0417  NA 137 138 138 139 138 139 140  K 4.6 5.2* 5.3* 5.6* 5.7* 4.3 4.4  CL 100* 103 100* 102 100* 98* 98*  CO2 27 21* 23 23 23 25 25   GLUCOSE 191* 135* 195* 190* 174* 107* 107*  BUN 27* 40* 62* 78* 94* 67* 70*  CREATININE 1.14* 1.86* 2.41* 2.92* 3.35* 2.82* 2.83*  CALCIUM 8.1* 8.2* 8.3* 8.3* 8.4* 8.3* 8.2*  PHOS 1.8* 2.2* 2.8 3.4 4.6 4.6 4.6   CBC  Recent Labs Lab 01/29/17 0429 01/30/17 0433 01/31/17 0515 02/01/17 0416  WBC 16.7* 20.1* 16.7* 18.7*  NEUTROABS 13.1* 16.3* 13.7* 15.9*  HGB 7.3* 6.6* 7.8* 7.7*  HCT 23.4* 21.9* 24.9* 25.8*  MCV 88.6 86.6 85.9 88.7  PLT 71* 115* 127* 198    Medications:    . amiodarone  200 mg Oral BID  . Chlorhexidine Gluconate Cloth  6 each Topical Daily  . darbepoetin (ARANESP) injection - NON-DIALYSIS  150 mcg Subcutaneous Q Mon-1800  . feeding supplement (NEPRO CARB STEADY)  1,000 mL Per Tube Q24H  . feeding supplement (PRO-STAT SUGAR FREE 64)  30 mL Per Tube TID  . hydrocortisone sod succinate (SOLU-CORTEF) inj  50 mg Intravenous Q6H  . insulin aspart  2-6 Units Subcutaneous Q4H  . mouth rinse  15 mL Mouth Rinse BID  . midodrine  15 mg Oral TID WC  . pantoprazole sodium  40 mg Per Tube Q24H      Hyman Bible, MD PGY-2 Family Medicine Resident 02/01/2017, 8:35 AM   I have seen and examined this patient and agree with plan and assessment in the above note with renal recommendations/intervention highlighted. Tolerated HD yesterday with UF and will plan again tomorrow.  Continue to wean pressors as able and  cont with po midodrine.  Pt remains asymptomatic, so unclear if her BP is "real".  Not sure if she had arterial line that was similar to BP cuff readings.   Broadus John A Andilyn Bettcher,MD 02/01/2017 11:52 AM

## 2017-02-01 NOTE — Progress Notes (Addendum)
Pt accidentally pulled cor track out while picking in and around nose. No respiratory distress. Pt currently alert and oriented x3. Sallow screen passed earlier and pt already had dinner w/o complications. Will continue to monitor closely and have day shift RN address with primary team in AM. Paige Chiquito RN 2 Heart

## 2017-02-01 NOTE — Progress Notes (Signed)
  Speech Language Pathology Treatment: Dysphagia  Patient Details Name: Paige Marshall MRN: [id number] DOB: [birth year] Today's Date: 02/01/2017 Time: 1000-1012 SLP Time Calculation (min) (ACUTE ONLY): 12 min  Assessment / Plan / Recommendation Clinical Impression  F/u after yesterday's MBS - pt just completed breakfast.  Observed to have excellent toleration of regular solids, thin liquids with no s/s of aspiration.  Pt with strong phonation/cough; recalls basic precautions independently.  Adequate toleration respiration/swallowing.  No further SLP f/u is warranted - our services will sign off.  Continue regular consistency diet, thin liquids.   HPI HPI: 61 year old woman with poorly controlled OHS, chronic combined systolic and diastolic CHF, a-fib/a-flutter on chronic anticoagulation, hypertension, COPD, CKD Stage 3. Admitted to ED from [facility] on 3/18 foracute decompensated CHF complicated by cardiorenal syndrome. During stay developed acute on chronic kidney disease requiring CRRT, now being followed by nephrology for PRN HD. Blood culture positive for VRE bacteremia being treated with ampicillin and ceftriaxone. Transferred to step-down on 4/11. On 4/12 noted to be more lethargic and aphasic. CT Head revealed mixed density mass like area along the surface of the anterior frontal lobe with cytotoxic edema as well as a rounded 2 cm hemorrhage.ETT 4/13-4/18. Initial FEES 01/27/17 with recs for NPO.        SLP Plan  All goals met       Recommendations  Diet recommendations: Regular;Thin liquid Liquids provided via: Cup;Straw Medication Administration: Whole meds with puree Supervision: Patient able to self feed                Oral Care Recommendations: Oral care BID SLP Visit Diagnosis: Dysphagia, oropharyngeal phase (R13.12) Plan: All goals met       GO                Paige Marshall 02/01/2017, 10:48 AM

## 2017-02-01 NOTE — Progress Notes (Signed)
Off CRRT since 4/21 5 AM. Trial of HD yesterday went ok, . Breathing ok, no chest pain, no other complaints. Remains on neo and vaso low dose, NGT out, CVP 14.   BP (!) 70/46   Pulse 90   Temp 98 F (36.7 C) (Oral)   Resp 16   Ht 5\' 8"  (1.727 m)   Wt 271 lb 2.7 oz (123 kg)   LMP 09/07/2011   SpO2 94%   BMI 41.23 kg/m   Vitals reviewed. General: resting in bed, NAD HEENT: PERRL, EOMI, no scleral icterus Cardiac: irregular  Pulm: clear to auscultation bilaterally, no wheezes Abd: soft, nontender, nondistended, BS present Ext: warm and well perfused, 1+ edema.  Neuro: alert and oriented X3, cranial nerves II-XII grossly intact, strength and sensation to light touch equal in bilateral upper and lower extremities  CMP Latest Ref Rng & Units 02/01/2017 02/01/2017 01/31/2017  Glucose 65 - 99 mg/dL 107(H) 107(H) 174(H)  BUN 6 - 20 mg/dL 70(H) 67(H) 94(H)  Creatinine 0.44 - 1.00 mg/dL 2.83(H) 2.82(H) 3.35(H)  Sodium 135 - 145 mmol/L 140 139 138  Potassium 3.5 - 5.1 mmol/L 4.4 4.3 5.7(H)  Chloride 101 - 111 mmol/L 98(L) 98(L) 100(L)  CO2 22 - 32 mmol/L 25 25 23   Calcium 8.9 - 10.3 mg/dL 8.2(L) 8.3(L) 8.4(L)  Total Protein 6.5 - 8.1 g/dL - - -  Total Bilirubin 0.3 - 1.2 mg/dL - - -  Alkaline Phos 38 - 126 U/L - - -  AST 15 - 41 U/L - - -  ALT 14 - 54 U/L - - -   CBC Latest Ref Rng & Units 02/01/2017 01/31/2017 01/30/2017  WBC 4.0 - 10.5 K/uL 18.7(H) 16.7(H) 20.1(H)  Hemoglobin 12.0 - 15.0 g/dL 7.7(L) 7.8(L) 6.6(LL)  Hematocrit 36.0 - 46.0 % 25.8(L) 24.9(L) 21.9(L)  Platelets 150 - 400 K/uL 198 127(L) 115(L)    Assessment/plan:  Acute on chronic combined CHF. Cardiogenic shock. A fib with RVR. Remains on pressors -cont pressor to maintain MAP >60, cont midodrine.  -cont amio PO today, hold anticoag due to bleeding risk with recent SDH  Acute renal failure - off CRRT since 4/21 5 AM. Tolerated intermittent HD yesterday,   -cont HD if tolerates, still remains on pressor which is  not a good sign.  -cont GOC discussion  VRE bacteremia. - f/u repeat blood culture from 4/19  Acute on chronic respiratory failure 2/2 to  Hx of COPD, OSA, OHS + CHF - prn BDs, bipap qHS - doing ok on [place] for now.   Dysphagia. - passed swallow test, NGT out, do renal diet now.   Acute on Chronic Anemia - stable after few units of transfusion intermittently.  Thrombocytopenia -resolved   likely DIC - improving -f/up CBC   Relative adrenal insufficiency. - continue solu cortef while on pressors  Acute metabolic encephalopathy -resolved, was likely from uremia and hypotension Recent SDH  - monitor mental status  Code status: limited except pressor.   Paige Marshall, M.D.   Attending Note:  61 year old female with ESRD on HD who remains hypotensive due to heart failure who tolerated dialysis overnight but on levophed, tolerated 3.5 hours of HD.  On exam, the patient remains on levophed at 4 mcg and vaso at 0.03 with decreased BS at the bases.  I reviewed CXR myself with pulmonary edema noted.  Spoke with patient, will continue pressors for now.  Continue midodrine and stress dose steroids.  Passed swallow evaluation.  Switch all  medications to PO.  Will allow for more time and titrate pressors as mental status allows.  The patient is critically ill with multiple organ systems failure and requires high complexity decision making for assessment and support, frequent evaluation and titration of therapies, application of advanced monitoring technologies and extensive interpretation of multiple databases.   Critical Care Time devoted to patient care services described in this note is  35  Minutes. This time reflects time of care of this signee Dr Jennet Maduro. This critical care time does not reflect procedure time, or teaching time or supervisory time of PA/NP/Med student/Med Resident etc but could involve care discussion time.  Rush Farmer, M.D. [facility] Pulmonary/Critical Care  Medicine. Pager: [phone]. After hours pager: [phone].

## 2017-02-01 NOTE — Progress Notes (Signed)
Advanced Heart Failure Rounding Note  PCP: Juluis Mire, NP  Primary Cardiologist: Dr. Gwenlyn Found   Subjective:    s/p failed DCCV 3/28 and 4/5.   01/20/17 - successful DCCV to NSR, lethargic and with garbled speech. CT head showed left frontal intracranial hemorrhage.   01/21/17 MRI head with small left frontal intracerebral hemorrhage with parafalcine small SDH, trace rightward midline shift. No CVA. Intubated for airway control.   CVVHD stopped 01/16/17, restarted on 4/14.  Went back into Afib 4/16.   Tolerated iHD yesterday. Remains on NE 2 and vasopressin 0.03. NE was off overnight but back on this am. Feels good. Denies dyspnea  Passed swallow study. NGT out. CVP 14  Objective:   Weight Range: 123 kg (271 lb 2.7 oz) Body mass index is 41.23 kg/m.   Vital Signs:   Temp:  [97.8 F (36.6 C)-98.7 F (37.1 C)] 98 F (36.7 C) (04/24 0330) Pulse Rate:  [85-117] 107 (04/24 0530) Resp:  [14-30] 20 (04/24 0530) BP: (71-125)/(32-94) 104/65 (04/24 0530) SpO2:  [74 %-100 %] 81 % (04/24 0530) Weight:  [123 kg (271 lb 2.7 oz)] 123 kg (271 lb 2.7 oz) (04/24 0500) Last BM Date: 01/31/17  Weight change: Filed Weights   01/26/17 0500 02/01/17 0500  Weight: 124 kg (273 lb 5.9 oz) 123 kg (271 lb 2.7 oz)    Intake/Output:   Intake/Output Summary (Last 24 hours) at 02/01/17 0540 Last data filed at 02/01/17 0200  Gross per 24 hour  Intake           2033.8 ml  Output             1800 ml  Net            233.8 ml    CVP 14 Physical Exam: General: Lying in bed. In good spirits  HEENT: normal anicteric Neck: Supple. JVP to jaw. Carotids ok  Cor: PMI non-palpable. IRR. IRR  + S3. Tunneled HD cath Lungs: clear anteriorly  Abdomen: obese  soft, NT/ND , no HSM. No bruits or masses. +BS Extremities: Warm. Trace edmea Neuro: Aware and alert. Moves all 4 extremities.  Right foot drop   Telemetry: AF 90s. Personally reviewed   Labs: CBC  Recent Labs  01/31/17 0515  02/01/17 0416  WBC 16.7* 18.7*  NEUTROABS 13.7* PENDING  HGB 7.8* 7.7*  HCT 24.9* 25.8*  MCV 85.9 88.7  PLT 127* PENDING   Basic Metabolic Panel  Recent Labs  02/01/17 0416 02/01/17 0417  NA 139 140  K 4.3 4.4  CL 98* 98*  CO2 25 25  GLUCOSE 107* 107*  BUN 67* 70*  CREATININE 2.82* 2.83*  CALCIUM 8.3* 8.2*  MG 2.3  --   PHOS 4.6 4.6   Liver Function Tests  Recent Labs  01/31/17 0506 02/01/17 0417  ALBUMIN 2.3* 2.4*    BNP: BNP (last 3 results)  Recent Labs  09/18/16 1126 11/07/16 1633 12/26/16 1643  BNP 991.0* 490.4* 1,116.0*    Transthoracic Echocardiography 01/11/17 Study Conclusions  - Left ventricle: The cavity size was mildly dilated. Wall   thickness was increased in a pattern of mild LVH. Left   ventricular geometry showed evidence of eccentric hypertrophy.   Systolic function was moderately reduced. The estimated ejection   fraction was in the range of 35% to 40%. Moderate diffuse   hypokinesis with no identifiable regional variations. Acoustic   contrast opacification revealed no evidence ofthrombus. - Ventricular septum: The contour showed diastolic  flattening.   These changes are consistent with RV volume overload. - Aortic valve: There was mild to moderate regurgitation directed   centrally in the LVOT. - Mitral valve: There was mild to moderate regurgitation directed   eccentrically and posteriorly. - Left atrium: The atrium was moderately dilated. - Right ventricle: The cavity size was severely dilated. Wall   thickness was normal. Systolic function was mildly reduced. - Right atrium: The atrium was severely dilated. - Tricuspid valve: There was malcoaptation of the valve leaflets.   There was moderate-severe regurgitation. - Pulmonary arteries: Systolic pressure was severely increased. PA   peak pressure: 71 mm Hg (S). - Pericardium, extracardiac: A trivial pericardial effusion was   identified posterior to the  heart.    Medications:     Scheduled Medications: . Chlorhexidine Gluconate Cloth  6 each Topical Daily  . darbepoetin (ARANESP) injection - NON-DIALYSIS  150 mcg Subcutaneous Q Mon-1800  . feeding supplement (NEPRO CARB STEADY)  1,000 mL Per Tube Q24H  . feeding supplement (PRO-STAT SUGAR FREE 64)  30 mL Per Tube TID  . hydrocortisone sod succinate (SOLU-CORTEF) inj  50 mg Intravenous Q6H  . insulin aspart  2-6 Units Subcutaneous Q4H  . mouth rinse  15 mL Mouth Rinse BID  . midodrine  15 mg Oral TID WC  . pantoprazole sodium  40 mg Per Tube Q24H    Infusions: . sodium chloride Stopped (01/24/17 0330)  . amiodarone 30 mg/hr (02/01/17 0200)  . norepinephrine (LEVOPHED) Adult infusion 2.027 mcg/min (02/01/17 0200)  . vasopressin (PITRESSIN) infusion - *FOR SHOCK* 0.03 Units/min (02/01/17 0200)    PRN Medications: Place/Maintain arterial line **AND** sodium chloride, acetaminophen (TYLENOL) oral liquid 160 mg/5 mL, diphenhydrAMINE-zinc acetate, Gerhardt's butt cream, heparin, ondansetron (ZOFRAN) IV, senna-docusate   Assessment/Plan   1. Acute on chronic combined systolic and diastolic CHF: EF 47-42% on TEE 01/13/17 (down from 55% in March 2018 when first admitted). Likely tachy mediated from atrial fibrillation.  - She has biventricular failure R>>L.  - Tolerated iHD yesterday though remains on 2 pressors. Will try to wean vasopressin today  - Continue midodrine 15 mg TID.  2. Left frontal ICD with parafalcine SDH: Trace right midline shift. No CVA. Per Neuro her small ICH/SDH does not explain the degree of her AMS. Likely a component of metabolic encephalopathy due to renal failure. - Has right foot drop o/w stable - Neuro status stable. Remains off AC. Can consider DVT dose heparin soon.  3. PAF: Failed DCCV 3/27, 4/5. Had DCCV with return to NSR on 01/20/17, went back into Afib 01/24/17.  - Rate much improved. Will switch amio to 200 bid now that she is taking po  - No longer a  candidate for DCCV as she is off anticoagulation due to [place]  4. Acute on chronic kidney disease: - As above. Tolerated iHD yesterday. Attempt to wean vasopressin. Continue midodrine 5. Obesity hypoventilation syndrome 6. Septic shock:   - Completed ABX  01/26/17.   - Repeat BCx NGTD. Afebrile.  7. 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  - Severe. Possibly end stage.   8. Acute respiratory failure: - Extubated. Respiratory status stable.  - On 4L [place] 9. Anemia:  - Hgb 7.8 s/p 2 UPRBCs 01/30/17.  10. NSVT:  - Relatively quiescent continue amio  11. Thrombocytopenia: 2/2 DIC - SR assay negative -PLTs recovering  12. Tube feeds  -Passed swallow study can start diet  13. Hyperkalemia - resolved with  HD  Patient is critically ill and remains in imminent danger of multiple organ failure. Overall very poor prognosis.   Length of Stay: 32  Glori Bickers, MD  02/01/2017, 5:40 AM Advanced Heart Failure Team  Pager [phone] M-F 7am-4pm.  Please contact [facility] for night-coverage after hours (4p -7a ) and weekends on [email or web address]

## 2017-02-02 LAB — CBC WITH DIFFERENTIAL/PLATELET
BASOS ABS: 0.2 10*3/uL — AB (ref 0.0–0.1)
Basophils Relative: 1 %
Eosinophils Absolute: 0.2 10*3/uL (ref 0.0–0.7)
Eosinophils Relative: 1 %
HCT: 26 % — ABNORMAL LOW (ref 36.0–46.0)
HEMOGLOBIN: 7.8 g/dL — AB (ref 12.0–15.0)
LYMPHS ABS: 1.2 10*3/uL (ref 0.7–4.0)
LYMPHS PCT: 7 %
MCH: 26.9 pg (ref 26.0–34.0)
MCHC: 30 g/dL (ref 30.0–36.0)
MCV: 89.7 fL (ref 78.0–100.0)
MONOS PCT: 4 %
Monocytes Absolute: 0.7 10*3/uL (ref 0.1–1.0)
Neutro Abs: 15.3 10*3/uL — ABNORMAL HIGH (ref 1.7–7.7)
Neutrophils Relative %: 87 %
Platelets: 169 10*3/uL (ref 150–400)
RBC: 2.9 MIL/uL — AB (ref 3.87–5.11)
RDW: 35.8 % — AB (ref 11.5–15.5)
WBC: 17.6 10*3/uL — AB (ref 4.0–10.5)

## 2017-02-02 LAB — RENAL FUNCTION PANEL
ALBUMIN: 2.5 g/dL — AB (ref 3.5–5.0)
Anion gap: 17 — ABNORMAL HIGH (ref 5–15)
BUN: 92 mg/dL — ABNORMAL HIGH (ref 6–20)
CO2: 23 mmol/L (ref 22–32)
Calcium: 8.7 mg/dL — ABNORMAL LOW (ref 8.9–10.3)
Chloride: 96 mmol/L — ABNORMAL LOW (ref 101–111)
Creatinine, Ser: 3.98 mg/dL — ABNORMAL HIGH (ref 0.44–1.00)
GFR calc Af Amer: 13 mL/min — ABNORMAL LOW (ref >60)
GFR, EST NON AFRICAN AMERICAN: 11 mL/min — AB (ref >60)
GLUCOSE: 103 mg/dL — AB (ref 65–99)
PHOSPHORUS: 7.2 mg/dL — AB (ref 2.5–4.6)
Potassium: 4.8 mmol/L (ref 3.5–5.1)
SODIUM: 136 mmol/L (ref 135–145)

## 2017-02-02 LAB — GLUCOSE, CAPILLARY
GLUCOSE-CAPILLARY: 116 mg/dL — AB (ref 65–99)
GLUCOSE-CAPILLARY: 98 mg/dL (ref 65–99)
Glucose-Capillary: 130 mg/dL — ABNORMAL HIGH (ref 65–99)
Glucose-Capillary: 67 mg/dL (ref 65–99)
Glucose-Capillary: 108 mg/dL — ABNORMAL HIGH (ref 65–99)
Glucose-Capillary: 124 mg/dL — ABNORMAL HIGH (ref 65–99)
Glucose-Capillary: 112 mg/dL — ABNORMAL HIGH (ref 65–99)
Glucose-Capillary: 99 mg/dL (ref 65–99)

## 2017-02-02 MED ORDER — HEPARIN SODIUM (PORCINE) 1000 UNIT/ML DIALYSIS
20.0000 [IU]/kg | INTRAMUSCULAR | Status: DC | PRN
  Filled 2017-02-02: qty 3

## 2017-02-02 MED ORDER — DEXTROSE 50 % IV SOLN
INTRAVENOUS | Status: AC
  Administered 2017-02-03: 25 mL
  Filled 2017-02-02: qty 50

## 2017-02-02 NOTE — Progress Notes (Deleted)
Patient ambulated 740 ft standby assist, no walker or assistive device used, patient tolerated walk well, patient back to chair, call bell in reach, wife at bedside will continue to monitor.  Rowe Pavy, RN

## 2017-02-02 NOTE — Progress Notes (Signed)
[name]   HEMATOLOGY/ONCOLOGY INPATIENT PROGRESS NOTE  Date of Service: 02/02/2017  Inpatient Attending: .Juanito Doom, MD   SUBJECTIVE  Patient seen for interval hematology f/u for thrombocytopenia. Heparin PF4 AB was in the indetermine range but SRA was negative and ruled out HIT syndrome. Her platelet counts have normalized with treatment of underlying sepsis with resultant improvement in DIC. She is aware and alert and conversant today. No further issues with worsening SDH. Continuing to need ICU level cares and dialysis.    OBJECTIVE:  NAD  PHYSICAL EXAMINATION: . Vitals:   02/02/17 1957 02/02/17 2100 02/02/17 2200 02/02/17 2300  BP:  121/64 132/75 118/72  Pulse:  94 91 (!) 106  Resp:  18 17 16   Temp: 98.6 F (37 C)     TempSrc: Oral     SpO2:  97% 97% 100%  Weight:      Height:       Filed Weights   02/02/17 0500 02/02/17 1435 02/02/17 1845  Weight: 268 lb 15.4 oz (122 kg) 268 lb 15.4 oz (122 kg) 266 lb 12.1 oz (121 kg)   .Body mass index is 40.56 kg/m.  GENERAL:alert, in no acute distress on [place] 4L/min EYES: normal, conjunctiva are pink and non-injected, sclera clear OROPHARYNX:MMM NECK: supple, JVD+ LYMPH:  no palpable lymphadenopathy in the cervical LUNGS: coarse distant breath sounds HEART: irreg, 2-3+ pedal edema ABDOMEN: abdomen soft, non-tender, distant bowel sounds  Musculoskeletal: no cyanosis of digits and no clubbing  PSYCH: alert & oriented x 3 with fluent speech NEURO: no focal motor/sensory deficits  MEDICAL HISTORY:  Past Medical History:  Diagnosis Date  . Asthma   . Chronic combined systolic and diastolic CHF (congestive heart failure) ([place])   . CKD (chronic kidney disease), stage III   . COPD (chronic obstructive pulmonary disease) ([place])   . Degenerative joint disease of both lower legs   . Former smoker   . Gout    hx in "both knees" (01/14/2015)  . H. pylori infection 01/03/2014   +breath test.    . High cholesterol   .  Hypertension   . Morbid obesity ([place])   . NICM (nonischemic cardiomyopathy) ([place])    a. LHC 01/2015: normal cors, EF 45-50%. b. EF 50-55% in 08/2016. c. EF 33% 01/2017  . OSA (obstructive sleep apnea)   . Persistent atrial fibrillation ([place])   . Pneumonia X 2  . Pulmonary hypertension ([place])   . Rheumatoid arthritis ([place])   . Stroke [facility])     SURGICAL HISTORY: Past Surgical History:  Procedure Laterality Date  . ANGIOGRAM/LV (CONGENITAL)  2007  . BREATH TEK H PYLORI N/A 12/31/2013   Procedure: BREATH TEK H PYLORI;  Surgeon: Gayland Curry, MD;  Location: [facility];  Service: General;  Laterality: N/A;  . CARDIOVERSION N/A 01/13/2017   Procedure: CARDIOVERSION;  Surgeon: Jolaine Artist, MD;  Location: [facility] ENDOSCOPY;  Service: Cardiovascular;  Laterality: N/A;  . CARDIOVERSION N/A 01/20/2017   Procedure: CARDIOVERSION;  Surgeon: Larey Dresser, MD;  Location: [facility] [facility];  Service: Cardiovascular;  Laterality: N/A;  . CORNEAL TRANSPLANT Right ~ 2010  . CORONARY ANGIOPLASTY WITH STENT PLACEMENT  11/04/2005   "1"  . DOPPLER ECHOCARDIOGRAPHY     2 D   EF of 45%  . EYE SURGERY    . IR GENERIC HISTORICAL  01/06/2017   IR FLUORO GUIDE CV LINE RIGHT 01/06/2017 Corrie Mckusick, DO [facility]INTER[facility]  . IR GENERIC HISTORICAL  01/06/2017   IR US  GUIDE VASC ACCESS RIGHT 01/06/2017 Corrie Mckusick, DO [facility]  . LEFT HEART CATHETERIZATION WITH CORONARY ANGIOGRAM N/A 01/20/2015   Procedure: LEFT HEART CATHETERIZATION WITH CORONARY ANGIOGRAM;  Surgeon: Lorretta Harp, MD;  Location[facility] CATH LAB;  Service: Cardiovascular;  Laterality: N/A;  . RIGHT HEART CATH N/A 01/07/2017   Procedure: Right Heart Cath;  Surgeon: Jolaine Artist, MD;  Location: [place] CV LAB;  Service: Cardiovascular;  Laterality: N/A;  . sleep study  2011  . stress myocardial dipyridamole perfusion    . TEE WITHOUT CARDIOVERSION N/A 01/13/2017   Procedure: TRANSESOPHAGEAL ECHOCARDIOGRAM (TEE);  Surgeon: Jolaine Artist, MD;   Location: [place];  Service: Cardiovascular;  Laterality: N/A;  . [place]  . venous duplex ultrasound  2013    SOCIAL HISTORY: Social History   Social History  . Marital status: Widowed    Spouse name: N/A  . Number of children: N/A  . Years of education: N/A   Occupational History  . Not on file.   Social History Main Topics  . Smoking status: Former Smoker    Packs/day: 0.33    Years: 35.00    Types: Cigarettes    Quit date: 10/11/2008  . Smokeless tobacco: Never Used  . Alcohol use No     Comment: 01/14/2015 "~ 4, 24oz cans beer/wk"  . Drug use: No  . Sexual activity: No   Other Topics Concern  . Not on file   Social History Narrative  . No narrative on file    FAMILY HISTORY: Family History  Problem Relation Age of Onset  . Heart disease Mother   . Cancer Father     colon  . Hypertension Other     ALLERGIES:  is allergic to penicillins and citrus.  MEDICATIONS:  Scheduled Meds: . amiodarone  200 mg Oral BID  . Chlorhexidine Gluconate Cloth  6 each Topical Daily  . darbepoetin (ARANESP) injection - NON-DIALYSIS  150 mcg Subcutaneous Q Mon-1800  . hydrocortisone sod succinate (SOLU-CORTEF) inj  50 mg Intravenous Q6H  . insulin aspart  2-6 Units Subcutaneous Q4H  . mouth rinse  15 mL Mouth Rinse BID  . midodrine  15 mg Oral TID WC  . pantoprazole  40 mg Oral Daily   Continuous Infusions: . sodium chloride Stopped (01/24/17 0330)  . norepinephrine (LEVOPHED) Adult infusion Stopped (02/02/17 0900)  . vasopressin (PITRESSIN) infusion - *FOR SHOCK* Stopped (02/02/17 0421)   PRN Meds:.Place/Maintain arterial line **AND** sodium chloride, acetaminophen (TYLENOL) oral liquid 160 mg/5 mL, diphenhydrAMINE-zinc acetate, Gerhardt's butt cream, heparin, ondansetron (ZOFRAN) IV, senna-docusate  REVIEW OF SYSTEMS:    10 Point review of Systems was done is negative except as noted above.   LABORATORY DATA:  I have reviewed the data as  listed  . CBC Latest Ref Rng & Units 02/02/2017 02/01/2017 01/31/2017  WBC 4.0 - 10.5 K/uL 17.6(H) 18.7(H) 16.7(H)  Hemoglobin 12.0 - 15.0 g/dL 7.8(L) 7.7(L) 7.8(L)  Hematocrit 36.0 - 46.0 % 26.0(L) 25.8(L) 24.9(L)  Platelets 150 - 400 K/uL 169 198 127(L)    . CMP Latest Ref Rng & Units 02/02/2017 02/01/2017 02/01/2017  Glucose 65 - 99 mg/dL 103(H) 107(H) 107(H)  BUN 6 - 20 mg/dL 92(H) 70(H) 67(H)  Creatinine 0.44 - 1.00 mg/dL 3.98(H) 2.83(H) 2.82(H)  Sodium 135 - 145 mmol/L 136 140 139  Potassium 3.5 - 5.1 mmol/L 4.8 4.4 4.3  Chloride 101 - 111 mmol/L 96(L) 98(L) 98(L)  CO2 22 - 32 mmol/L 23  25 25  Calcium 8.9 - 10.3 mg/dL 8.7(L) 8.2(L) 8.3(L)  Total Protein 6.5 - 8.1 g/dL - - -  Total Bilirubin 0.3 - 1.2 mg/dL - - -  Alkaline Phos 38 - 126 U/L - - -  AST 15 - 41 U/L - - -  ALT 14 - 54 U/L - - -   Serotonin release assay (SRA)  Order: [id number]  Status:  Final result Visible to patient:  No (Not Released) Next appt:  03/22/2017 at 11:00 AM in Cardiology Gwenlyn Found, Roderic Palau, MD)   Ref Range & Units 9d ago  SRA .2 IU/mL UFH Ser-aCnc 0 - 20 % <1   SRA 100IU/mL UFH Ser-aCnc 0 - 20 % <1   SRA UFH Ser-Imp  Comment   Comments: (NOTE)  SRA Result: NEGATIVE           RADIOGRAPHIC STUDIES: I have personally reviewed the radiological images as listed and agreed with the findings in the report.  ASSESSMENT & PLAN:   61 year old patient with multiple medical comorbidities currently admitted with VRE bacteremia, CHF exacerbation, b/l Subdural hematomas with AMS with  1) New Thrombocytopenia from 01/21/2017 Peripheral blood smear shows no evidence of platelet clumping to suggest pseudothrombocytopenia. Thrombocytopenia is likely multifactorial.  Predominant factor seems to be DIC with elevated d-dimer, few schistocytes in the peripheral blood and elevated PTT and PT.  HIT syndrome unlikely. Intermediate probability T score with intermediate level HIT PF4 AB at 1.12 and  downtrending to 0.83 with neg Serotonin Release Assay which rules out HIT syndrome.  No evidence of TTP/ HUS syndrome. Haptoglobin is within normal limits with no overt evidence of significant hemolysis. LDH is elevated likely due to infection and other issues.  Cannot rule out medication related thrombocytopenia and from the cytopenia due to sepsis in addition.  Patient platelet counts have now progressively normalized and are currently normal  PLAN -findings and lab results discussed with the patient at bedside -we shall signoff at this time unless any new questions/concerns arise -no evidence of HIT syndrome -continue as critical care mx as per PCCM team.  I spent 20 minutes counseling the patient face to face. The total time spent in the appointment was 25 minutes and more than 50% was on counseling and direct patient cares.    Sullivan Lone MD [place] AAHIVMS [facility] Hematology/Oncology Physician [facility]  (Office):       [phone] (Work cell):  [phone] (Fax):           [phone]

## 2017-02-02 NOTE — Plan of Care (Signed)
Problem: Activity: Goal: Capacity to carry out activities will improve Outcome: Not Progressing Pt remains bed bound/chair bound  Problem: Health Behavior/Discharge Planning: Goal: Ability to manage health-related needs will improve for discharge Outcome: Progressing Education ongoing  Problem: Activity: Goal: Ability to tolerate increased activity will improve Outcome: Not Progressing See previous  Problem: Cardiac: Goal: Ability to achieve and maintain adequate cardiopulmonary perfusion will improve Outcome: Progressing Titrating gtt's

## 2017-02-02 NOTE — Progress Notes (Signed)
Arrived to patient room 2H-11 at 1435.  Reviewed treatment plan and this RN agrees with plan.  Report received from bedside RN, Nira Conn.  Consent verified.  Patient A & O X 4.   Lung sounds diminished to ausculation in all fields. Generalized +1 pitting edema. Cardiac:  Afib.  Removed caps and cleansed RIJ catheter with chlorhedxidine.  Aspirated ports of heparin and flushed them with saline per protocol.  Connected and secured lines, initiated treatment at 1445.  UF Goal of 2000 mL and net fluid removal 1.5 L.  Will continue to monitor.

## 2017-02-02 NOTE — Progress Notes (Addendum)
Advanced Heart Failure Rounding Note  PCP: Juluis Mire, NP  Primary Cardiologist: Dr. Gwenlyn Found   Subjective:    s/p failed DCCV 3/28 and 4/5.   01/20/17 - successful DCCV to NSR, lethargic and with garbled speech. CT head showed left frontal intracranial hemorrhage.   01/21/17 MRI head with small left frontal intracerebral hemorrhage with parafalcine small SDH, trace rightward midline shift. No CVA. Intubated for airway control.   CVVHD stopped 01/16/17, restarted on 4/14.  Went back into Afib 4/16.   Off vasopressin. Remains on norepi at 2. Sleepy this am but easily rouses. Denies SOB, CP or lightheadedness/dizziness   Tolerated iHD 01/31/17 and to re-try today.    Passed swallow study. Now on po meds. CVP 9-10.  Objective:   Weight Range: 268 lb 15.4 oz (122 kg) Body mass index is 40.9 kg/m.   Vital Signs:   Temp:  [97.5 F (36.4 C)-98 F (36.7 C)] 98 F (36.7 C) (04/25 0330) Pulse Rate:  [87-118] 100 (04/25 0645) Resp:  [11-24] 14 (04/25 0700) BP: (69-139)/(35-89) 108/55 (04/25 0700) SpO2:  [77 %-99 %] 91 % (04/25 0645) Weight:  [268 lb 15.4 oz (122 kg)] 268 lb 15.4 oz (122 kg) (04/25 0500) Last BM Date: 02/01/17  Weight change: Filed Weights   02/01/17 0500 02/02/17 0500  Weight: 271 lb 2.7 oz (123 kg) 268 lb 15.4 oz (122 kg)    Intake/Output:   Intake/Output Summary (Last 24 hours) at 02/02/17 5361 Last data filed at 02/02/17 0600  Gross per 24 hour  Intake           286.39 ml  Output              150 ml  Net           136.39 ml    CVP 9-10 Physical Exam: General: Lying in bed. Sleepy but rouses easily. NAD.  HEENT: Normal Neck: supple. JVD 9-10 cm. Carotids OK.  Cor: PMI not palpable. Irregular. +S3, + Tunneled HD cath.  Lungs: CTAB, normal effort.  Extremities: Warm. Trace BLE edema.   Neuro: alert to person, cranial nerves grossly intact. Affect pleasant. +R foot drop  Telemetry: Personally reviewed, AF 80-90s   Labs: CBC  Recent  Labs  02/01/17 0416 02/02/17 0453  WBC 18.7* 17.6*  NEUTROABS 15.9* PENDING  HGB 7.7* 7.8*  HCT 25.8* PENDING  MCV 88.7 PENDING  PLT 198 PENDING   Basic Metabolic Panel  Recent Labs  02/01/17 0416 02/01/17 0417 02/02/17 0426  NA 139 140 136  K 4.3 4.4 4.8  CL 98* 98* 96*  CO2 25 25 23   GLUCOSE 107* 107* 103*  BUN 67* 70* 92*  CREATININE 2.82* 2.83* 3.98*  CALCIUM 8.3* 8.2* 8.7*  MG 2.3  --   --   PHOS 4.6 4.6 7.2*   Liver Function Tests  Recent Labs  02/01/17 0417 02/02/17 0426  ALBUMIN 2.4* 2.5*    BNP: BNP (last 3 results)  Recent Labs  09/18/16 1126 11/07/16 1633 12/26/16 1643  BNP 991.0* 490.4* 1,116.0*    Transthoracic Echocardiography 01/11/17 Study Conclusions  - Left ventricle: The cavity size was mildly dilated. Wall   thickness was increased in a pattern of mild LVH. Left   ventricular geometry showed evidence of eccentric hypertrophy.   Systolic function was moderately reduced. The estimated ejection   fraction was in the range of 35% to 40%. Moderate diffuse   hypokinesis with no identifiable regional variations. Acoustic  contrast opacification revealed no evidence ofthrombus. - Ventricular septum: The contour showed diastolic flattening.   These changes are consistent with RV volume overload. - Aortic valve: There was mild to moderate regurgitation directed   centrally in the LVOT. - Mitral valve: There was mild to moderate regurgitation directed   eccentrically and posteriorly. - Left atrium: The atrium was moderately dilated. - Right ventricle: The cavity size was severely dilated. Wall   thickness was normal. Systolic function was mildly reduced. - Right atrium: The atrium was severely dilated. - Tricuspid valve: There was malcoaptation of the valve leaflets.   There was moderate-severe regurgitation. - Pulmonary arteries: Systolic pressure was severely increased. PA   peak pressure: 71 mm Hg (S). - Pericardium, extracardiac: A  trivial pericardial effusion was   identified posterior to the heart.    Medications:     Scheduled Medications: . amiodarone  200 mg Oral BID  . Chlorhexidine Gluconate Cloth  6 each Topical Daily  . darbepoetin (ARANESP) injection - NON-DIALYSIS  150 mcg Subcutaneous Q Mon-1800  . feeding supplement (NEPRO CARB STEADY)  1,000 mL Per Tube Q24H  . feeding supplement (PRO-STAT SUGAR FREE 64)  30 mL Per Tube TID  . hydrocortisone sod succinate (SOLU-CORTEF) inj  50 mg Intravenous Q6H  . insulin aspart  2-6 Units Subcutaneous Q4H  . mouth rinse  15 mL Mouth Rinse BID  . midodrine  15 mg Oral TID WC  . pantoprazole  40 mg Oral Daily    Infusions: . sodium chloride Stopped (01/24/17 0330)  . norepinephrine (LEVOPHED) Adult infusion 2 mcg/min (02/02/17 0041)  . vasopressin (PITRESSIN) infusion - *FOR SHOCK* Stopped (02/02/17 0421)    PRN Medications: Place/Maintain arterial line **AND** sodium chloride, acetaminophen (TYLENOL) oral liquid 160 mg/5 mL, diphenhydrAMINE-zinc acetate, Gerhardt's butt cream, ondansetron (ZOFRAN) IV, senna-docusate   Assessment/Plan   1. Acute on chronic combined systolic and diastolic CHF: EF 07-37% on TEE 01/13/17 (down from 55% in March 2018 when first admitted). Likely tachy mediated from atrial fibrillation.  - She has biventricular failure R>>L.  - Tolerated iHD 01/31/17. Continue to wean pressors as tolerated.   - Continue midodrine 15 mg TID.  2. Left frontal ICD with parafalcine SDH: Trace right midline shift. No CVA. Per Neuro her small ICH/SDH does not explain the degree of her AMS. Likely a component of metabolic encephalopathy due to renal failure. - Has right foot drop o/w stable - Neuro status stable. Off AC. Can consider DVT prophylaxis as tolerated. Hgb low but stable.   3. PAF: Failed DCCV 3/27, 4/5. Had DCCV with return to NSR on 01/20/17, went back into Afib 01/24/17.  - Rate stable on po amio 200 mg BID. No change.  - No longer a  candidate for DCCV as she is off anticoagulation due to [place].  4. Acute on chronic kidney disease => ESRD - Tolerated iHD 01/31/17. To re-trial today.    5. Obesity hypoventilation syndrome 6. Septic shock:   - Completed ABX  01/26/17.   - Repeat BCx negative. Afebrile.   7. 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  - Severe. ? End stage. No change to current plan.  8. Acute respiratory failure: - Stable on [place] 02.   9. Anemia:  - Hgb 7.8, Low but stable. No overt bleeding. s/p 2 UPRBCs 01/30/17.  10. NSVT:  - Relatively quiescent. Now on po amio. No change.  11. Thrombocytopenia: 2/2 DIC - SR assay negative -PLTs 169.  Relatively stable.    12. Tube feeds  - Passed swallow test and now on diet/po meds. No change.  13. Hyperkalemia - Resolved with HD.   Length of Stay: [address]  Annamaria Helling  02/02/2017, 7:22 AM Advanced Heart Failure Team  Pager [phone] M-F 7am-4pm.  Please contact [facility] for night-coverage after hours (4p -7a ) and weekends on [email or web address]

## 2017-02-02 NOTE — Progress Notes (Signed)
Off vaso, remains on low dose levo. Denies CP/sob or any other symptom.   BP (!) 108/55   Pulse 100   Temp 98 F (36.7 C) (Oral)   Resp 14   Ht 5\' 8"  (1.727 m)   Wt 268 lb 15.4 oz (122 kg)   LMP 09/07/2011   SpO2 91%   BMI 40.90 kg/m   Vitals reviewed. General: resting in bed, NAD HEENT: PERRL, EOMI, no scleral icterus Cardiac: irregular  Pulm: clear to auscultation bilaterally, no wheezes Abd: soft, nontender, nondistended, BS present Ext: warm and well perfused, 1+ edema.  Neuro: alert and oriented X3, cranial nerves II-XII grossly intact, strength and sensation to light touch equal in bilateral upper and lower extremities  CMP Latest Ref Rng & Units 02/02/2017 02/01/2017 02/01/2017  Glucose 65 - 99 mg/dL 103(H) 107(H) 107(H)  BUN 6 - 20 mg/dL 92(H) 70(H) 67(H)  Creatinine 0.44 - 1.00 mg/dL 3.98(H) 2.83(H) 2.82(H)  Sodium 135 - 145 mmol/L 136 140 139  Potassium 3.5 - 5.1 mmol/L 4.8 4.4 4.3  Chloride 101 - 111 mmol/L 96(L) 98(L) 98(L)  CO2 22 - 32 mmol/L 23 25 25   Calcium 8.9 - 10.3 mg/dL 8.7(L) 8.2(L) 8.3(L)  Total Protein 6.5 - 8.1 g/dL - - -  Total Bilirubin 0.3 - 1.2 mg/dL - - -  Alkaline Phos 38 - 126 U/L - - -  AST 15 - 41 U/L - - -  ALT 14 - 54 U/L - - -   CBC Latest Ref Rng & Units 02/02/2017 02/01/2017 01/31/2017  WBC 4.0 - 10.5 K/uL 17.6(H) 18.7(H) 16.7(H)  Hemoglobin 12.0 - 15.0 g/dL 7.8(L) 7.7(L) 7.8(L)  Hematocrit 36.0 - 46.0 % PENDING 25.8(L) 24.9(L)  Platelets 150 - 400 K/uL PENDING 198 127(L)    Assessment/plan:  Acute on chronic combined CHF. Cardiogenic shock. A fib with RVR. Remains on pressors but coming down  -cont pressor to maintain MAP >60, likely can wean off today, cont midodrine.  -cont amio PO, hold anticoag due to bleeding risk with recent SDH  Acute renal failure - off CRRT since 4/21 5 AM. - Tolerated intermittent HD 4/23, will try again today per nephro -cont GOC discussion  VRE bacteremia. - f/u repeat blood culture from  4/19  Acute on chronic respiratory failure 2/2 to  Hx of COPD, OSA, OHS + CHF -stable  - prn BDs, bipap qHS - doing ok on [place] for now.   Dysphagia. - passed swallow test, NGT out, on renal diet.   Acute on Chronic Anemia - stable after few units of transfusion intermittently.  Thrombocytopenia -resolved   likely DIC - improving -f/up CBC   Relative adrenal insufficiency. - continue solu cortef while on pressors  Acute metabolic encephalopathy -resolved, was likely from uremia and hypotension Recent SDH  - monitor mental status  Code status: limited except pressor.   Dellia Nims, M.D.   Attending Note:  61 year old female with systolic heart failure who developed renal failure and respiratory failure.  Now extubated but is dialysis dependent.  SBP has been the mean issue and is on levophed 2 mcg as well as 15 mg of midodrine 3 times daily.  On exam, she is in chair, alert and interactive, moving all ext to commands.  I reviewed CXR myself, no acute disease noted.  Will change levophed for a SBP of 90 instead of MAP as her DBP is very low.  Will continue midodrine and continue support for now.  Discussed code status  and remains partial with pressors only.  Hold in the ICU today and evaluate pressor need during dialysis.  The patient is critically ill with multiple organ systems failure and requires high complexity decision making for assessment and support, frequent evaluation and titration of therapies, application of advanced monitoring technologies and extensive interpretation of multiple databases.   Critical Care Time devoted to patient care services described in this note is  35  Minutes. This time reflects time of care of this signee Dr Jennet Maduro. This critical care time does not reflect procedure time, or teaching time or supervisory time of PA/NP/Med student/Med Resident etc but could involve care discussion time.  Rush Farmer, M.D. [facility] Pulmonary/Critical  Care Medicine. Pager: [phone]. After hours pager: [phone].

## 2017-02-02 NOTE — Progress Notes (Signed)
Dialysis treatment completed.  1500 mL ultrafiltrated.  1000 mL net fluid removal.  Patient status unchanged. Lung sounds diminished to ausculation in all fields. Generalized edema. Cardiac: Afib, NSR.  Cleansed RIJ catheter with chlorhexidine.  Disconnected lines and flushed ports with saline per protocol.  Ports locked with heparin and capped per protocol.    Report given to bedside, RN Heather.

## 2017-02-02 NOTE — Progress Notes (Signed)
[facility] Progress Note    Assessment/ Plan:   1. Anuric AKI: due to persistent shock. Tolerated HD on 4/23. Off Vasopressin yesterday and Levophed this morning. CVP 9-10. Still has not produced any urine - Plan for HD today  2. Paroxysmal Afib / acute on chronic combined systolic and diastolic HF - HF team following - PO Amiodarone - No anticoagulation for recent SDH  3. Persistent shock s/p VRE bacteremia- improving. MAPs in the 70s-90s. - On Midodrine 15mg  tid and stress dose steroids - Completed abx 01/26/17, repeat blood cultures 4/19 NGTD - off of pressors with markedly improved BP this morning.  4.  SDH: stable - Not a candidate for anticoagulation  5.  Anemia: s/p 2 units pRBCs 4/22. Hgb stable. - Aranesp q Monday  5.  Thrombocytopenia- likely DIC, improving.  HIT ab + but assay neg   Subjective:    Doing well this morning. No concerns. No shortness of breath. Still has not urinated.   Objective:   BP (!) 97/55   Pulse 83   Temp 97.7 F (36.5 C) (Oral)   Resp 14   Ht 5\' 8"  (1.727 m)   Wt 122 kg (268 lb 15.4 oz)   LMP 09/07/2011   SpO2 93%   BMI 40.90 kg/m   Intake/Output Summary (Last 24 hours) at 02/02/17 1132 Last data filed at 02/02/17 0900  Gross per 24 hour  Intake           230.21 ml  Output              150 ml  Net            80.21 ml   Weight change: -2 lb 3.3 oz (-1 kg)  Physical Exam: Gen: Laying in bed, in NAD CVS: Irregularly irregular rhythm Resp: Diminished breath sounds in the lung bases, lungs otherwise clear Abd: obese, soft, non-tender Ext: 1+ pitting edema in the lower extremities bilaterally ACCESS: R TDC in place  Imaging: Dg Swallowing Func-speech Pathology  Result Date: 01/31/2017 Objective Swallowing Evaluation: Type of Study: MBS-Modified Barium Swallow Study Patient Details Name: MELANEY TELLEFSEN MRN: [id number] Date of Birth: July 03, 1956 Today's Date: 01/31/2017 Time: SLP Start Time (ACUTE ONLY):  1350-SLP Stop Time (ACUTE ONLY): 1450 SLP Time Calculation (min) (ACUTE ONLY): 60 min Past Medical History: Past Medical History: Diagnosis Date . Asthma  . Chronic combined systolic and diastolic CHF (congestive heart failure) ([place])  . CKD (chronic kidney disease), stage III  . COPD (chronic obstructive pulmonary disease) ([place])  . Degenerative joint disease of both lower legs  . Former smoker  . Gout   hx in "both knees" (01/14/2015) . H. pylori infection 01/03/2014  +breath test.   . High cholesterol  . Hypertension  . Morbid obesity ([place])  . NICM (nonischemic cardiomyopathy) ([place])   a. LHC 01/2015: normal cors, EF 45-50%. b. EF 50-55% in 08/2016. c. EF 33% 01/2017 . OSA (obstructive sleep apnea)  . Persistent atrial fibrillation ([place])  . Pneumonia X 2 . Pulmonary hypertension ([place])  . Rheumatoid arthritis ([place])  . Stroke [facility])  Past Surgical History: Past Surgical History: Procedure Laterality Date . ANGIOGRAM/LV (CONGENITAL)  2007 . BREATH TEK H PYLORI N/A 12/31/2013  Procedure: BREATH TEK H PYLORI;  Surgeon: Gayland Curry, MD;  Location: [facility];  Service: General;  Laterality: N/A; . CARDIOVERSION N/A 01/13/2017  Procedure: CARDIOVERSION;  Surgeon: Jolaine Artist, MD;  Location: [facility] ENDOSCOPY;  Service: Cardiovascular;  Laterality: N/A; .  CARDIOVERSION N/A 01/20/2017  Procedure: CARDIOVERSION;  Surgeon: Larey Dresser, MD;  Location: [facility];  Service: Cardiovascular;  Laterality: N/A; . CORNEAL TRANSPLANT Right ~ 2010 . CORONARY ANGIOPLASTY WITH STENT PLACEMENT  11/04/2005  "1" . DOPPLER ECHOCARDIOGRAPHY    2 D   EF of 45% . EYE SURGERY   . IR GENERIC HISTORICAL  01/06/2017  IR FLUORO GUIDE CV LINE RIGHT 01/06/2017 Corrie Mckusick, DO [facility]INTER[facility] . IR GENERIC HISTORICAL  01/06/2017  IR US GUIDE VASC ACCESS RIGHT 01/06/2017 Corrie Mckusick, DO [facility] . LEFT HEART CATHETERIZATION WITH CORONARY ANGIOGRAM N/A 01/20/2015  Procedure: LEFT HEART CATHETERIZATION WITH CORONARY ANGIOGRAM;  Surgeon: Lorretta Harp,  MD;  Location: [facility] CATH LAB;  Service: Cardiovascular;  Laterality: N/A; . RIGHT HEART CATH N/A 01/07/2017  Procedure: Right Heart Cath;  Surgeon: Jolaine Artist, MD;  Location: [facility] CV LAB;  Service: Cardiovascular;  Laterality: N/A; . sleep study  2011 . stress myocardial dipyridamole perfusion   . TEE WITHOUT CARDIOVERSION N/A 01/13/2017  Procedure: TRANSESOPHAGEAL ECHOCARDIOGRAM (TEE);  Surgeon: Jolaine Artist, MD;  Location: [facility];  Service: Cardiovascular;  Laterality: N/A; . [place] . venous duplex ultrasound  8280 HPI: 61 year old woman with poorly controlled OHS, chronic combined systolic and diastolic CHF, a-fib/a-flutter on chronic anticoagulation, hypertension, COPD, CKD Stage 3. Admitted to ED from [facility] on 3/18 foracute decompensated CHF complicated by cardiorenal syndrome. During stay developed acute on chronic kidney disease requiring CRRT, now being followed by nephrology for PRN HD. Blood culture positive for VRE bacteremia being treated with ampicillin and ceftriaxone. Transferred to step-down on 4/11. On 4/12 noted to be more lethargic and aphasic. CT Head revealed mixed density mass like area along the surface of the anterior frontal lobe with cytotoxic edema as well as a rounded 2 cm hemorrhage.ETT 4/13-4/18. Initial FEES 01/27/17 with recs for NPO.   Subjective: pleasant, communicative Assessment / Plan / Recommendation CHL IP CLINICAL IMPRESSIONS 01/31/2017 Clinical Impression Pt presents with much improved swallow function with adequate mastication, delay to the pyriforms for thin liquids, but no residue post-swallow and no penetration nor aspiration.  Pt taxed with successive, large thin liquid boluses, but maintained adequate airway protection.  Recommend resuming a regular consistency diet with thin liquids; give meds whole in puree.  NG may be discontinued.   SLP Visit Diagnosis Dysphagia, oropharyngeal phase (R13.12) Attention and concentration deficit  following -- Frontal lobe and executive function deficit following -- Impact on safety and function No limitations   CHL IP TREATMENT RECOMMENDATION 01/27/2017 Treatment Recommendations Therapy as outlined in treatment plan below   Prognosis 01/27/2017 Prognosis for Safe Diet Advancement Good Barriers to Reach Goals Cognitive deficits Barriers/Prognosis Comment -- CHL IP DIET RECOMMENDATION 01/31/2017 SLP Diet Recommendations Regular solids;Thin liquid Liquid Administration via Cup;Straw Medication Administration Whole meds with puree Compensations -- Postural Changes Seated upright at 90 degrees   CHL IP OTHER RECOMMENDATIONS 01/31/2017 Recommended Consults -- Oral Care Recommendations Oral care BID Other Recommendations --   CHL IP FOLLOW UP RECOMMENDATIONS 01/28/2017 Follow up Recommendations Inpatient Rehab   CHL IP FREQUENCY AND DURATION 01/31/2017 Speech Therapy Frequency (ACUTE ONLY) min 1 x/week Treatment Duration 1 week      CHL IP ORAL PHASE 01/31/2017 Oral Phase WFL Oral - Pudding Teaspoon -- Oral - Pudding Cup -- Oral - Honey Teaspoon -- Oral - Honey Cup -- Oral - Nectar Teaspoon -- Oral - Nectar Cup -- Oral - Nectar Straw -- Oral - Thin  Teaspoon -- Oral - Thin Cup -- Oral - Thin Straw -- Oral - Puree -- Oral - Mech Soft -- Oral - Regular -- Oral - Multi-Consistency -- Oral - Pill -- Oral Phase - Comment --  CHL IP PHARYNGEAL PHASE 01/31/2017 Pharyngeal Phase WFL Pharyngeal- Pudding Teaspoon -- Pharyngeal -- Pharyngeal- Pudding Cup -- Pharyngeal -- Pharyngeal- Honey Teaspoon NT Pharyngeal -- Pharyngeal- Honey Cup -- Pharyngeal -- Pharyngeal- Nectar Teaspoon WFL Pharyngeal -- Pharyngeal- Nectar Cup -- Pharyngeal -- Pharyngeal- Nectar Straw -- Pharyngeal -- Pharyngeal- Thin Teaspoon WFL Pharyngeal -- Pharyngeal- Thin Cup WFL Pharyngeal -- Pharyngeal- Thin Straw -- Pharyngeal -- Pharyngeal- Puree WFL Pharyngeal -- Pharyngeal- Mechanical Soft -- Pharyngeal -- Pharyngeal- Regular -- Pharyngeal -- Pharyngeal-  Multi-consistency -- Pharyngeal -- Pharyngeal- Pill -- Pharyngeal -- Pharyngeal Comment --  No flowsheet data found. No flowsheet data found. [name] 01/31/2017, 3:03 PM               Labs: BMET  Recent Labs Lab 01/29/17 1554 01/30/17 0433 01/30/17 1557 01/31/17 0506 02/01/17 0416 02/01/17 0417 02/02/17 0426  NA 138 138 139 138 139 140 136  K 5.2* 5.3* 5.6* 5.7* 4.3 4.4 4.8  CL 103 100* 102 100* 98* 98* 96*  CO2 21* 23 23 23 25 25 23   GLUCOSE 135* 195* 190* 174* 107* 107* 103*  BUN 40* 62* 78* 94* 67* 70* 92*  CREATININE 1.86* 2.41* 2.92* 3.35* 2.82* 2.83* 3.98*  CALCIUM 8.2* 8.3* 8.3* 8.4* 8.3* 8.2* 8.7*  PHOS 2.2* 2.8 3.4 4.6 4.6 4.6 7.2*   CBC  Recent Labs Lab 01/30/17 0433 01/31/17 0515 02/01/17 0416 02/02/17 0453  WBC 20.1* 16.7* 18.7* 17.6*  NEUTROABS 16.3* 13.7* 15.9* 15.3*  HGB 6.6* 7.8* 7.7* 7.8*  HCT 21.9* 24.9* 25.8* 26.0*  MCV 86.6 85.9 88.7 89.7  PLT 115* 127* 198 169    Medications:    . amiodarone  200 mg Oral BID  . Chlorhexidine Gluconate Cloth  6 each Topical Daily  . darbepoetin (ARANESP) injection - NON-DIALYSIS  150 mcg Subcutaneous Q Mon-1800  . hydrocortisone sod succinate (SOLU-CORTEF) inj  50 mg Intravenous Q6H  . insulin aspart  2-6 Units Subcutaneous Q4H  . mouth rinse  15 mL Mouth Rinse BID  . midodrine  15 mg Oral TID WC  . pantoprazole  40 mg Oral Daily      [place] Bible, MD PGY-2 Family Medicine Resident 02/02/2017, 11:32 AM   I have seen and examined this patient and agree with plan and assessment in the above note with renal recommendations/intervention highlighted. BP has rebounded significantly and unexpectedly.  Will follow with HD today.  Remains in good spirits and is very hopeful.  Governor [place] Jan Walters,MD 02/02/2017 2:34 PM

## 2017-02-02 NOTE — Progress Notes (Signed)
Hypoglycemic Event  CBG: 67   Treatment: 15 GM carbohydrate snack  Symptoms: None  Follow-up CBG: [id number] CBG Result: Pending  Possible Reasons for Event: Inadequate meal intake  Comments/MD notified: md to round    Burman Blacksmith

## 2017-02-03 DIAGNOSIS — R0902 Hypoxemia: Secondary | ICD-10-CM

## 2017-02-03 LAB — BASIC METABOLIC PANEL
Anion gap: 12 (ref 5–15)
BUN: 43 mg/dL — AB (ref 6–20)
CHLORIDE: 101 mmol/L (ref 101–111)
CO2: 25 mmol/L (ref 22–32)
Calcium: 8.3 mg/dL — ABNORMAL LOW (ref 8.9–10.3)
Creatinine, Ser: 2.78 mg/dL — ABNORMAL HIGH (ref 0.44–1.00)
GFR calc Af Amer: 20 mL/min — ABNORMAL LOW (ref >60)
GFR calc non Af Amer: 18 mL/min — ABNORMAL LOW (ref >60)
Glucose, Bld: 109 mg/dL — ABNORMAL HIGH (ref 65–99)
Potassium: 4.3 mmol/L (ref 3.5–5.1)
SODIUM: 138 mmol/L (ref 135–145)

## 2017-02-03 LAB — CBC WITH DIFFERENTIAL/PLATELET
BASOS PCT: 1 %
Basophils Absolute: 0.1 10*3/uL (ref 0.0–0.1)
EOS ABS: 0.1 10*3/uL (ref 0.0–0.7)
Eosinophils Relative: 1 %
HCT: 27.5 % — ABNORMAL LOW (ref 36.0–46.0)
Hemoglobin: 7.9 g/dL — ABNORMAL LOW (ref 12.0–15.0)
LYMPHS ABS: 0.8 10*3/uL (ref 0.7–4.0)
LYMPHS PCT: 6 %
MCH: 26.5 pg (ref 26.0–34.0)
MCHC: 28.7 g/dL — AB (ref 30.0–36.0)
MCV: 92.3 fL (ref 78.0–100.0)
Monocytes Absolute: 0.8 10*3/uL (ref 0.1–1.0)
Monocytes Relative: 6 %
NEUTROS ABS: 12 10*3/uL — AB (ref 1.7–7.7)
Neutrophils Relative %: 86 %
Platelets: 177 10*3/uL (ref 150–400)
RBC: 2.98 MIL/uL — ABNORMAL LOW (ref 3.87–5.11)
RDW: 37.8 % — ABNORMAL HIGH (ref 11.5–15.5)
WBC: 13.8 10*3/uL — ABNORMAL HIGH (ref 4.0–10.5)

## 2017-02-03 LAB — RENAL FUNCTION PANEL
Albumin: 2.4 g/dL — ABNORMAL LOW (ref 3.5–5.0)
Anion gap: 12 (ref 5–15)
BUN: 42 mg/dL — AB (ref 6–20)
CALCIUM: 8.5 mg/dL — AB (ref 8.9–10.3)
CO2: 26 mmol/L (ref 22–32)
CREATININE: 2.83 mg/dL — AB (ref 0.44–1.00)
Chloride: 99 mmol/L — ABNORMAL LOW (ref 101–111)
GFR calc Af Amer: 20 mL/min — ABNORMAL LOW (ref >60)
GFR calc non Af Amer: 17 mL/min — ABNORMAL LOW (ref >60)
GLUCOSE: 108 mg/dL — AB (ref 65–99)
Phosphorus: 5.9 mg/dL — ABNORMAL HIGH (ref 2.5–4.6)
Potassium: 3.9 mmol/L (ref 3.5–5.1)
Sodium: 137 mmol/L (ref 135–145)

## 2017-02-03 LAB — GLUCOSE, CAPILLARY
GLUCOSE-CAPILLARY: 103 mg/dL — AB (ref 65–99)
GLUCOSE-CAPILLARY: 111 mg/dL — AB (ref 65–99)
GLUCOSE-CAPILLARY: 129 mg/dL — AB (ref 65–99)
GLUCOSE-CAPILLARY: 96 mg/dL (ref 65–99)
Glucose-Capillary: 103 mg/dL — ABNORMAL HIGH (ref 65–99)
Glucose-Capillary: 137 mg/dL — ABNORMAL HIGH (ref 65–99)
Glucose-Capillary: 69 mg/dL (ref 65–99)
Glucose-Capillary: 97 mg/dL (ref 65–99)

## 2017-02-03 LAB — MAGNESIUM: MAGNESIUM: 2.1 mg/dL (ref 1.7–2.4)

## 2017-02-03 LAB — PHOSPHORUS: Phosphorus: 5.8 mg/dL — ABNORMAL HIGH (ref 2.5–4.6)

## 2017-02-03 MED ORDER — SIMETHICONE 80 MG PO CHEW
80.0000 mg | CHEWABLE_TABLET | Freq: Four times a day (QID) | ORAL | Status: DC | PRN
  Administered 2017-02-03 – 2017-02-10 (×2): 80 mg via ORAL
  Filled 2017-02-03 (×2): qty 1

## 2017-02-03 MED ORDER — HYDROCORTISONE NA SUCCINATE PF 100 MG IJ SOLR
25.0000 mg | Freq: Four times a day (QID) | INTRAMUSCULAR | Status: AC
  Administered 2017-02-03 – 2017-02-06 (×9): 25 mg via INTRAVENOUS
  Filled 2017-02-03 (×10): qty 2

## 2017-02-03 MED ORDER — HYDROCORTISONE NA SUCCINATE PF 100 MG IJ SOLR
12.5000 mg | Freq: Two times a day (BID) | INTRAMUSCULAR | Status: AC
  Administered 2017-02-06 – 2017-02-09 (×6): 12.5 mg via INTRAVENOUS
  Filled 2017-02-03 (×7): qty 2

## 2017-02-03 NOTE — Evaluation (Signed)
Occupational Therapy Evaluation Patient Details Name: Paige Marshall MRN: [id number] DOB: 01/01/1956 Today's Date: 02/03/2017    History of Present Illness 61 y.o. female with morbid obesity / wheelchair bound, untreated OHS, chronic combined systolic / diastolic CHF, NICM, AF on coumadin, HTN, 3L O2 dependent, pulmonary hypertension, COPD & CKD III.  The patient suffered a left fibular fracture in late 2017 and has been at [facility] since. Admitted 12/26/16 from SNF with acute CHF. Pt started on CVVHD that was then discontinued for HD. 3/31 pt developed septic shock and CVVHD restarted.[name] CVVHD discontinued 4/9.  Restarted 4/14 and transition to HD 4/25 with discontinuation of pressors.  Had DCCV third time to NSR 4/12 then AMS and CT positive L frontal ICH and MRI noted parafalcine SDH trace R midline shift.  Reverted back to a-fib 4/16.   Clinical Impression   Pt has needed extensive assistance for ADL and mobility for several months. She presents with L sided weakness, poor sitting balance, decreased activity tolerance, generalized weakness and L visual field cut. Pt is unable to stand and requires lift equipment for transfers. Pt will likely need extensive rehab upon discharge. She is highly motivated. Will follow acutely.    Follow Up Recommendations  SNF;Supervision/Assistance - 24 hour    Equipment Recommendations       Recommendations for Other Services       Precautions / Restrictions Precautions Precautions: Fall Precaution Comments: watch vitals Restrictions Weight Bearing Restrictions: No      Mobility Bed Mobility Overal bed mobility: Needs Assistance Bed Mobility: Rolling Rolling: Max assist;+2 for safety/equipment         General bed mobility comments: returned to bed from chair  Transfers Overall transfer level: Needs assistance Equipment used: Rolling walker (2 wheeled) Transfers: Sit to/from Stand Sit to Stand: +2 physical assistance;Max assist          General transfer comment: from recliner attempted x 4 but pt unable to achieve full liftoff with +2 max A attempting to lift with pad under pt, but pt leaning back with poor anterior weight shift; lifted back to bed via maxisky    Balance Overall balance assessment: Needs assistance Sitting-balance support: No upper extremity supported;Feet supported Sitting balance-Leahy Scale: Poor Sitting balance - Comments: seated edge of chair with armrests dropped pt able to reach R, but initially min to mod support for balance to reach to L, improved to minguard after practice; but unable to sit static unsupported without S Postural control: Left lateral lean Standing balance support: Bilateral upper extremity supported Standing balance-Leahy Scale: Zero Standing balance comment: unable to achieve standing                           ADL either performed or assessed with clinical judgement   ADL Overall ADL's : Needs assistance/impaired Eating/Feeding: Sitting;Set up   Grooming: Minimal assistance;Sitting   Upper Body Bathing: Maximal assistance;Sitting   Lower Body Bathing: Total assistance;Bed level   Upper Body Dressing : Moderate assistance;Sitting   Lower Body Dressing: Total assistance;Bed level                       Vision Baseline Vision/History: No visual deficits Additional Comments: L visual field cut     Perception     Praxis      Pertinent Vitals/Pain Pain Assessment: Faces Faces Pain Scale: Hurts even more Pain Location: bottom "raw" Pain Descriptors / Indicators: Discomfort;Tender;Sore Pain Intervention(s):  Monitored during session;Repositioned     Hand Dominance Right   Extremity/Trunk Assessment Upper Extremity Assessment Upper Extremity Assessment: LUE deficits/detail;RUE deficits/detail RUE Deficits / Details: generalized weakness LUE Deficits / Details: 3-/5 strength throughout LUE Sensation: decreased proprioception LUE Coordination:  decreased fine motor;decreased gross motor   Lower Extremity Assessment Lower Extremity Assessment: Defer to PT evaluation RLE Deficits / Details: hip and knee AAROM grossly WFL, strength grossly 2/5 at hip and 3/5 at knee LLE Deficits / Details: hip and knee AAROM grossly WFL, strength grossly 2/5 at hip and 3-/5 at knee       Communication Communication Communication: No difficulties   Cognition Arousal/Alertness: Awake/alert Behavior During Therapy: WFL for tasks assessed/performed Overall Cognitive Status: Impaired/Different from baseline Area of Impairment: Safety/judgement                         Safety/Judgement: Decreased awareness of deficits         General Comments  Visual testing note peripheral vision less on L than R, wears glasses    Exercises     Shoulder Instructions      Home Living Family/patient expects to be discharged to:: Skilled nursing facility                                        Prior Functioning/Environment Level of Independence: Needs assistance  Gait / Transfers Assistance Needed: hoyer lift from bed <> w/c. In PT has assistance to stand in parallel bars for 2-3 minutes, hasn't walked since fibula fx in Nov 2017, prior to that she walked with a RW and used O2 at night.  ADL's / Homemaking Assistance Needed: assist with ADL at bed level            OT Problem List: Decreased strength;Decreased activity tolerance;Impaired balance (sitting and/or standing);Impaired vision/perception;Decreased coordination;Decreased cognition;Decreased knowledge of use of DME or AE;Obesity;Pain;Impaired UE functional use;Impaired sensation      OT Treatment/Interventions: Self-care/ADL training;Neuromuscular education;Therapeutic activities;Patient/family education;Balance training;Cognitive remediation/compensation;Visual/perceptual remediation/compensation    OT Goals(Current goals can be found in the care plan section) Acute  Rehab OT Goals Patient Stated Goal: to walk OT Goal Formulation: With patient Time For Goal Achievement: 02/17/17 Potential to Achieve Goals: Good ADL Goals Pt Will Perform Grooming: with set-up;sitting Pt Will Perform Upper Body Bathing: with min assist;sitting Pt Will Perform Upper Body Dressing: with min assist;sitting Pt/caregiver will Perform Home Exercise Program: Left upper extremity;Independently (AAROM/AROM) Additional ADL Goal #1: Pt will locate ADL items in L hemispace independently. Additional ADL Goal #2: Pt will sit EOB with supervision while involved in ADL. Additional ADL Goal #3: Pt will perform sit>stand with +2 mod assist as a precursor to ADL.  OT Frequency: Min 2X/week   Barriers to D/C:            Co-evaluation PT/OT/SLP Co-Evaluation/Treatment: Yes Reason for Co-Treatment: For patient/therapist safety PT goals addressed during session: Mobility/safety with mobility;Balance;Strengthening/ROM OT goals addressed during session: ADL's and self-care      End of Session Equipment Utilized During Treatment: Gait belt;Rolling walker  Activity Tolerance: Patient tolerated treatment well Patient left: in bed;with call bell/phone within reach  OT Visit Diagnosis: Muscle weakness (generalized) (M62.81);Pain;Other symptoms and signs involving cognitive function;Hemiplegia and hemiparesis Hemiplegia - Right/Left: Left Hemiplegia - dominant/non-dominant: Non-Dominant Hemiplegia - caused by: Nontraumatic intracerebral hemorrhage  Time: 1010-1041 OT Time Calculation (min): 31 min Charges:  OT General Charges $OT Visit: 1 Procedure OT Evaluation $OT Eval Moderate Complexity: 1 Procedure G-Codes:       Malka So 02/03/2017, 4:09 PM  [phone]

## 2017-02-03 NOTE — Progress Notes (Signed)
Patients blood sugar 169. 1/2 amo of D50 given along with a graham cracker and sips of sprite. Patient not eating enough calories. Patient ate just 3 bites of dinner.                                       Graziella Connery RN

## 2017-02-03 NOTE — Care Management Note (Signed)
Case Management Note Previous CM note initiated by Maryclare Labrador, RN 01/28/2017, 9:51 AM   Patient Details  Name: MASIAH LEWING MRN: [id number] Date of Birth: Mar 17, 1956  Subjective/Objective:   CHF, pt currently on Milrinone gtt, Amiodarone gtt, Cardizem gtt                    Action/Plan: Discharge Planning: please see previous notes  Chart reviewed. Pt has been to SNF rehab at [facility].  Plan is dc to home with HH. Pt active with [facility] for HHPT, RN, OT and aide. Will need HH orders with F2F prior to dc. [facility] Liaison, they will continue to follow post dc. Will continue to follow for dc needs.   PCP  EDWARDS, [place]   Expected Discharge Date:                  Expected Discharge Plan:  [place]  In-House Referral:  Clinical Social Work  Discharge planning Services  CM Consult  Post Acute Care Choice:  Home Health Choice offered to:  Patient  DME Arranged:    DME Agency:     HH Arranged:  RN, PT, OT [place] Agency:  [place]  Status of Service:  In process, will continue to follow  If discussed at Long Length of Stay Meetings, dates discussed:    Additional Comments:  02/03/17- 1200- Marvetta Gibbons RN, CM- pt off CRRT, IHD now being done first tx 4/23, and again 4/25, plan for another on 2/27- will continue IHD for now- off pressors plan to tx out of ICU today- PT following - recommendations for CIR- will need order to eval- if pt goes home wants to use [facility] services.     Maryclare Labrador, RN 01/28/2017, 9:51 AM--Pt extubated 4/18.  CT Head revealed mixed density mass like area along the surface of the anterior frontal lobe with cytotoxic edema as well as a rounded 2 cm hemorrhage.  Pt remains on CRRT  01/21/17 Pt had to have RR called yesterday on SD - transferred to [facility] - started on pressor and reintubated  01/14/17 Pt remains on multiple drips and CRRT  01/06/17 Pt remains on multiple  drips.  CM will continue to follow for discharge needs  Dawayne Patricia, RN 02/03/2017, 12:14 PM [phone]

## 2017-02-03 NOTE — Progress Notes (Signed)
[facility] Progress Note    Assessment/ Plan:   1. Anuric AKI: due to persistent shock. Has tolerated HD very well. Completely off pressors. MAPs 70s-80s. Still no urine output. - Plan for another HD session tomorrow. - Start phos binder  2. Paroxysmal Afib / acute on chronic combined systolic and diastolic HF - HF team following - PO Amiodarone - No anticoagulation for recent SDH  3. Persistent shock s/p VRE bacteremia- resolved. BPs much better. - On Midodrine 15mg  tid and stress dose steroids - Completed abx 01/26/17, repeat blood cultures 4/19 NGTD  4.  SDH: stable - Not a candidate for anticoagulation  5.  Anemia: s/p 2 units pRBCs 4/22. Hgb stable. - Aranesp q Monday  6.  Thrombocytopenia- resolved - Hematology signing off  Dispo: Plan to transfer out of ICU today.   Subjective:    Patient states she is feeling well. She did not have any issues with HD yesterday. She thinks she keeps getting better. No concerns this morning. Still has not urinated.   Objective:   BP 124/75   Pulse 100   Temp 98.1 F (36.7 C) (Oral)   Resp 16   Ht 5\' 8"  (1.727 m)   Wt 275 lb 9.2 oz (125 kg)   LMP 09/07/2011   SpO2 94%   BMI 41.90 kg/m   Intake/Output Summary (Last 24 hours) at 02/03/17 1129 Last data filed at 02/03/17 0900  Gross per 24 hour  Intake              320 ml  Output             1050 ml  Net             -730 ml   Weight change: 0 lb (0 kg)  Physical Exam: Gen: Sitting up in chair, in NAD CVS: Irregularly irregular rhythm Resp: upper airway noises that clear with coughing, normal work of breathing Abd: obese, soft, non-tender Ext: 1+ pitting edema in the lower extremities bilaterally ACCESS: R TDC in place  Imaging: No results found.  Labs: BMET  Recent Labs Lab 01/30/17 0433 01/30/17 1557 01/31/17 0506 02/01/17 0416 02/01/17 0417 02/02/17 0426 02/03/17 0402  NA 138 139 138 139 140 136 138  137  K 5.3* 5.6* 5.7* 4.3 4.4  4.8 4.3  3.9  CL 100* 102 100* 98* 98* 96* 101  99*  CO2 23 23 23 25 25 23 25  26   GLUCOSE 195* 190* 174* 107* 107* 103* 109*  108*  BUN 62* 78* 94* 67* 70* 92* 43*  42*  CREATININE 2.41* 2.92* 3.35* 2.82* 2.83* 3.98* 2.78*  2.83*  CALCIUM 8.3* 8.3* 8.4* 8.3* 8.2* 8.7* 8.3*  8.5*  PHOS 2.8 3.4 4.6 4.6 4.6 7.2* 5.8*  5.9*   CBC  Recent Labs Lab 01/31/17 0515 02/01/17 0416 02/02/17 0453 02/03/17 0402  WBC 16.7* 18.7* 17.6* 13.8*  NEUTROABS 13.7* 15.9* 15.3* 12.0*  HGB 7.8* 7.7* 7.8* 7.9*  HCT 24.9* 25.8* 26.0* 27.5*  MCV 85.9 88.7 89.7 92.3  PLT 127* 198 169 177    Medications:    . amiodarone  200 mg Oral BID  . darbepoetin (ARANESP) injection - NON-DIALYSIS  150 mcg Subcutaneous Q Mon-1800  . [START ON 02/06/2017] hydrocortisone sod succinate (SOLU-CORTEF) inj  12.5 mg Intravenous Q12H  . hydrocortisone sod succinate (SOLU-CORTEF) inj  25 mg Intravenous Q6H  . insulin aspart  2-6 Units Subcutaneous Q4H  . mouth rinse  15 mL Mouth  Rinse BID  . midodrine  15 mg Oral TID WC  . pantoprazole  40 mg Oral Daily      Hyman Bible, MD PGY-2 Family Medicine Resident 02/03/2017, 11:29 AM   I have seen and examined this patient and agree with plan and assessment in the above note with renal recommendations/intervention highlighted.  She has done remarkably well with HD and has been off of pressors.  Will continue with intermittent HD and agree with transfer out of ICU. Broadus John A Johnice Riebe,MD 02/03/2017 12:11 PM

## 2017-02-03 NOTE — Progress Notes (Signed)
Remains off pressors 24+ hours. Tolerated iHD yesterday.  Denies CP/sob or any other symptom.   BP 123/65   Pulse 91   Temp 98.6 F (37 C) (Oral)   Resp 13   Ht 5\' 8"  (1.727 m)   Wt 275 lb 9.2 oz (125 kg)   LMP 09/07/2011   SpO2 97%   BMI 41.90 kg/m   Vitals reviewed. General: sitting in chair, NAD HEENT: PERRL, EOMI, no scleral icterus Cardiac: irregular  Pulm: clear to auscultation bilaterally, no wheezes Abd: soft, nontender, nondistended, BS present Ext: warm and well perfused, 1+ edema.  Neuro: alert and oriented X3, cranial nerves II-XII grossly intact, strength and sensation to light touch equal in bilateral upper and lower extremities  CMP Latest Ref Rng & Units 02/03/2017 02/03/2017 02/02/2017  Glucose 65 - 99 mg/dL 109(H) 108(H) 103(H)  BUN 6 - 20 mg/dL 43(H) 42(H) 92(H)  Creatinine 0.44 - 1.00 mg/dL 2.78(H) 2.83(H) 3.98(H)  Sodium 135 - 145 mmol/L 138 137 136  Potassium 3.5 - 5.1 mmol/L 4.3 3.9 4.8  Chloride 101 - 111 mmol/L 101 99(L) 96(L)  CO2 22 - 32 mmol/L 25 26 23   Calcium 8.9 - 10.3 mg/dL 8.3(L) 8.5(L) 8.7(L)  Total Protein 6.5 - 8.1 g/dL - - -  Total Bilirubin 0.3 - 1.2 mg/dL - - -  Alkaline Phos 38 - 126 U/L - - -  AST 15 - 41 U/L - - -  ALT 14 - 54 U/L - - -   CBC Latest Ref Rng & Units 02/03/2017 02/02/2017 02/01/2017  WBC 4.0 - 10.5 K/uL 13.8(H) 17.6(H) 18.7(H)  Hemoglobin 12.0 - 15.0 g/dL 7.9(L) 7.8(L) 7.7(L)  Hematocrit 36.0 - 46.0 % 27.5(L) 26.0(L) 25.8(L)  Platelets 150 - 400 K/uL 177 169 198    Assessment/plan:  Acute on chronic combined CHF. Cardiogenic shock - off pressors now 24+ hours, on midodrine.  A fib with RVR. -cont to monitor BP, showing some signs of improvement as she is maintaining her BP without pressors now.  cont midodrine.  -cont amio PO, hold anticoag due to bleeding risk with recent SDH  Acute renal failure -  - Tolerated intermittent HD 4/23 and 4/25. Hopefully will continue to maintain her BP off pressors and tolerate  iHD.  -f/up nephro recs.    Acute on chronic respiratory failure 2/2 to  Hx of COPD, OSA, OHS + CHF -stable  - prn BDs, bipap qHS - doing ok on [name]  Dysphagia. - tolerating oral diet.   Acute on Chronic Anemia - stable after few units of transfusion intermittently.  Thrombocytopenia -resolved   likely DIC - improving -f/up CBC   Relative adrenal insufficiency. - consider d/cing today as she is off pressors.   Acute metabolic encephalopathy -resolved, was likely from uremia and hypotension Recent SDH  - monitor mental status  Code status: limited except pressor.   Paige Marshall, M.D.   Attending Note:  61 year old female with septic shock and ESRD now on HD.  Tolerated HD off pressors.  On exam, lungs are clear and patient is moving all ext to command.  I reviewed CXR myself, no acute disease noted.  Discussed with TRH-MD.  Septic shock  - Resolved, d/c levophed and vasopressin  - Taper steroids  VRE bacteremia  - D/C abx (completed 19 days).  Anemia:  - CBC in AM  - Transfuse per ICU protocol  Hypoxemia:  - Titrate O2 for sat of 88-92%  Transfer to tele and to [facility]  with PCCM off 4/27  Patient seen and examined, agree with above note.  I dictated the care and orders written for this patient under my direction.  Rush Farmer, MD [phone]

## 2017-02-03 NOTE — Progress Notes (Signed)
RT NOTE:  Pt was unable to wear CPAP in ICU b/c of NG tube. Pt states tonight that she has not worn @ home because her machine is broken. She does not seem interested but stated she will have RN call if she decides to try it tonight. RT will continue to monitor

## 2017-02-03 NOTE — Evaluation (Signed)
Physical Therapy RE-Evaluation Patient Details Name: Paige Marshall MRN: [id number] DOB: Jun 09, 1956 Today's Date: 02/03/2017   History of Present Illness  (P) 61 y.o. female with morbid obesity / wheelchair bound, untreated OHS, chronic combined systolic / diastolic CHF, NICM, AF on coumadin, HTN, 3L O2 dependent, pulmonary hypertension, COPD & CKD III.  The patient suffered a left fibular fracture in late 2017 and has been at [facility] since. Admitted 12/26/16 from SNF with acute CHF. Pt started on CVVHD that was then discontinued for HD. 3/31 pt developed septic shock and CVVHD restarted.[name] CVVHD discontinued 4/9.  Restarted 4/14 and transition to HD 4/25 with discontinuation of pressors.  Had DCCV third time to NSR 4/12 then AMS and CT positive L frontal ICH and MRI noted parafalcine SDH trace R midline shift.  Reverted back to a-fib 4/16.  Clinical Impression  Patient presents s/p new ICH with decreased mobility today was unable to stand with +2 max A.  She likely will need extensive post acute rehab due to deficits and feel she is motivated and will benefit from SNF level rehab prior to d/c home. PT to follow acutely    Follow Up Recommendations SNF;Supervision/Assistance - 24 hour    Equipment Recommendations  Other (comment) (TBA)    Recommendations for Other Services       Precautions / Restrictions Precautions Precautions: (P) Fall Precaution Comments: (P) watch vitals Restrictions Weight Bearing Restrictions: (P) No      Mobility  Bed Mobility Overal bed mobility: (P) Needs Assistance Bed Mobility: (P) Rolling Rolling: (P) Max assist;+2 for safety/equipment         General bed mobility comments: (P) returned to bed from chair  Transfers Overall transfer level: (P) Needs assistance Equipment used: (P) Rolling walker (2 wheeled) Transfers: (P) Sit to/from Stand Sit to Stand: (P) +2 physical assistance;Max assist         General transfer comment: (P) from recliner  attempted x 4 but pt unable to achieve full liftoff with +2 max A attempting to lift with pad under pt, but pt leaning back with poor anterior weight shift; lifted back to bed via maxisky  Ambulation/Gait             General Gait Details: unable  Stairs            Wheelchair Mobility    Modified Rankin (Stroke Patients Only) Modified Rankin (Stroke Patients Only) Pre-Morbid Rankin Score: Severe disability Modified Rankin: Severe disability     Balance Overall balance assessment: (P) Needs assistance Sitting-balance support: No upper extremity supported;Feet supported Sitting balance-Leahy Scale: Poor Sitting balance - Comments: seated edge of chair with armrests dropped pt able to reach R, but initially min to mod support for balance to reach to L, improved to minguard after practice; but unable to sit static unsupported without S                                     Pertinent Vitals/Pain Pain Assessment: (P) Faces Faces Pain Scale: (P) Hurts even more Pain Location: (P) bottom "raw" Pain Descriptors / Indicators: (P) Discomfort;Tender;Sore Pain Intervention(s): (P) Monitored during session;Repositioned    Home Living Family/patient expects to be discharged to:: (P) Skilled nursing facility                      Prior Function Level of Independence: (P) Needs assistance   Gait /  Transfers Assistance Needed: (P) hoyer lift from bed <> w/c. In PT has assistance to stand in parallel bars for 2-3 minutes, hasn't walked since fibula fx in Nov 2017, prior to that she walked with a RW and used O2 at night.   ADL's / Homemaking Assistance Needed: (P) assist with ADL at bed level        Hand Dominance   Dominant Hand: (P) Right    Extremity/Trunk Assessment   Upper Extremity Assessment Upper Extremity Assessment: (P) LUE deficits/detail;RUE deficits/detail RUE Deficits / Details: (P) generalized weakness LUE Deficits / Details: (P) 3-/5  strength throughout LUE Sensation: (P) decreased proprioception LUE Coordination: (P) decreased fine motor;decreased gross motor    Lower Extremity Assessment Lower Extremity Assessment: (P) Defer to PT evaluation RLE Deficits / Details: hip and knee AAROM grossly WFL, strength grossly 2/5 at hip and 3/5 at knee LLE Deficits / Details: hip and knee AAROM grossly WFL, strength grossly 2/5 at hip and 3-/5 at knee       Communication   Communication: (P) No difficulties  Cognition Arousal/Alertness: (P) Awake/alert Behavior During Therapy: (P) WFL for tasks assessed/performed Overall Cognitive Status: (P) Impaired/Different from baseline Area of Impairment: (P) Safety/judgement                         Safety/Judgement: (P) Decreased awareness of deficits            General Comments General comments (skin integrity, edema, etc.): Visual testing note peripheral vision less on L than R, wears glasses    Exercises     Assessment/Plan    PT Assessment Patient needs continued PT services  PT Problem List Decreased strength;Decreased activity tolerance;Decreased mobility;Cardiopulmonary status limiting activity;Obesity;Decreased knowledge of use of DME;Decreased balance       PT Treatment Interventions Functional mobility training;Balance training;Therapeutic exercise;Patient/family education;DME instruction;Therapeutic activities;Wheelchair mobility training    PT Goals (Current goals can be found in the Care Plan section)  Acute Rehab PT Goals Patient Stated Goal: to walk PT Goal Formulation: With patient Time For Goal Achievement: 02/17/17 Potential to Achieve Goals: Good    Frequency Min 3X/week   Barriers to discharge        Co-evaluation PT/OT/SLP Co-Evaluation/Treatment: Yes Reason for Co-Treatment: (P) For patient/therapist safety PT goals addressed during session: Mobility/safety with mobility;Balance;Strengthening/ROM OT goals addressed during  session: (P) ADL's and self-care       End of Session Equipment Utilized During Treatment: Gait belt;Oxygen Activity Tolerance: Patient limited by fatigue Patient left: in bed;with call bell/phone within reach Nurse Communication: Mobility status;Need for lift equipment PT Visit Diagnosis: Muscle weakness (generalized) (M62.81);Difficulty in walking, not elsewhere classified (R26.2)    Time: 2707-8675 PT Time Calculation (min) (ACUTE ONLY): 30 min   Charges:   PT Evaluation $PT Re-evaluation: 1 Procedure     PT G CodesMagda Kiel, [place] 02/03/2017   Reginia Naas 02/03/2017, 4:03 PM

## 2017-02-03 NOTE — Progress Notes (Signed)
Advanced Heart Failure Rounding Note  PCP: Juluis Mire, NP  Primary Cardiologist: Dr. Gwenlyn Found   Subjective:    s/p failed DCCV 3/28 and 4/5.   01/20/17 - successful DCCV to NSR, lethargic and with garbled speech. CT head showed left frontal intracranial hemorrhage.   01/21/17 MRI head with small left frontal intracerebral hemorrhage with parafalcine small SDH, trace rightward midline shift. No CVA. Intubated for airway control.   CVVHD stopped 01/16/17, restarted on 4/14.  Went back into Afib 4/16.   Off pressors this am. Pressures slightly soft but overall improved. Much more alert this am. Tolerated HD last night without any major complications or pressure drops. No SOB, CP, lightheadedness or dizziness.   Passed swallow study. Now on po meds. CVP 7-8 on my personal check.  Objective:   Weight Range: 275 lb 9.2 oz (125 kg) Body mass index is 41.9 kg/m.   Vital Signs:   Temp:  [97.7 F (36.5 C)-98.8 F (37.1 C)] 98.1 F (36.7 C) (04/26 0730) Pulse Rate:  [78-113] 94 (04/26 0804) Resp:  [12-22] 15 (04/26 0804) BP: (67-161)/(51-119) 99/80 (04/26 0804) SpO2:  [83 %-100 %] 90 % (04/26 0804) Weight:  [266 lb 12.1 oz (121 kg)-275 lb 9.2 oz (125 kg)] 275 lb 9.2 oz (125 kg) (04/26 0404) Last BM Date: 02/03/17  Weight change: Filed Weights   02/02/17 1435 02/02/17 1845 02/03/17 0404  Weight: 268 lb 15.4 oz (122 kg) 266 lb 12.1 oz (121 kg) 275 lb 9.2 oz (125 kg)    Intake/Output:   Intake/Output Summary (Last 24 hours) at 02/03/17 0831 Last data filed at 02/03/17 0600  Gross per 24 hour  Intake            125.7 ml  Output             1050 ml  Net           -924.3 ml    CVP 7-8 Physical Exam: General: Seated in chair. Alert and oriented. NAD.  HEENT: Normal Neck: Supple. JVD 7-8 cm. Carotids OK.   Cor: PMI not-palpable. Irregular. +S3, +Tunneled HD cath.  Lungs: Clear, normal effort.   Extremities: Warm. Trace ankle edema.    Neuro: Alert and oriented x 3  this am. Cranial nerves grossly intact. + R foot drop.   Telemetry: Personally reviewed, AF 80-90s.    Labs: CBC  Recent Labs  02/02/17 0453 02/03/17 0402  WBC 17.6* 13.8*  NEUTROABS 15.3* 12.0*  HGB 7.8* 7.9*  HCT 26.0* 27.5*  MCV 89.7 92.3  PLT 169 425   Basic Metabolic Panel  Recent Labs  02/01/17 0416  02/02/17 0426 02/03/17 0402  NA 139  < > 136 138  137  K 4.3  < > 4.8 4.3  3.9  CL 98*  < > 96* 101  99*  CO2 25  < > 23 25  26   GLUCOSE 107*  < > 103* 109*  108*  BUN 67*  < > 92* 43*  42*  CREATININE 2.82*  < > 3.98* 2.78*  2.83*  CALCIUM 8.3*  < > 8.7* 8.3*  8.5*  MG 2.3  --   --  2.1  PHOS 4.6  < > 7.2* 5.8*  5.9*  < > = values in this interval not displayed. Liver Function Tests  Recent Labs  02/02/17 0426 02/03/17 0402  ALBUMIN 2.5* 2.4*    BNP: BNP (last 3 results)  Recent Labs  09/18/16 1126 11/07/16  1633 12/26/16 1643  BNP 991.0* 490.4* 1,116.0*    Transthoracic Echocardiography 01/11/17 Study Conclusions  - Left ventricle: The cavity size was mildly dilated. Wall   thickness was increased in a pattern of mild LVH. Left   ventricular geometry showed evidence of eccentric hypertrophy.   Systolic function was moderately reduced. The estimated ejection   fraction was in the range of 35% to 40%. Moderate diffuse   hypokinesis with no identifiable regional variations. Acoustic   contrast opacification revealed no evidence ofthrombus. - Ventricular septum: The contour showed diastolic flattening.   These changes are consistent with RV volume overload. - Aortic valve: There was mild to moderate regurgitation directed   centrally in the LVOT. - Mitral valve: There was mild to moderate regurgitation directed   eccentrically and posteriorly. - Left atrium: The atrium was moderately dilated. - Right ventricle: The cavity size was severely dilated. Wall   thickness was normal. Systolic function was mildly reduced. - Right atrium: The  atrium was severely dilated. - Tricuspid valve: There was malcoaptation of the valve leaflets.   There was moderate-severe regurgitation. - Pulmonary arteries: Systolic pressure was severely increased. PA   peak pressure: 71 mm Hg (S). - Pericardium, extracardiac: A trivial pericardial effusion was   identified posterior to the heart.    Medications:     Scheduled Medications: . amiodarone  200 mg Oral BID  . Chlorhexidine Gluconate Cloth  6 each Topical Daily  . darbepoetin (ARANESP) injection - NON-DIALYSIS  150 mcg Subcutaneous Q Mon-1800  . hydrocortisone sod succinate (SOLU-CORTEF) inj  50 mg Intravenous Q6H  . insulin aspart  2-6 Units Subcutaneous Q4H  . mouth rinse  15 mL Mouth Rinse BID  . midodrine  15 mg Oral TID WC  . pantoprazole  40 mg Oral Daily    Infusions: . sodium chloride Stopped (01/24/17 0330)  . norepinephrine (LEVOPHED) Adult infusion Stopped (02/02/17 0900)  . vasopressin (PITRESSIN) infusion - *FOR SHOCK* Stopped (02/02/17 0421)    PRN Medications: Place/Maintain arterial line **AND** sodium chloride, acetaminophen (TYLENOL) oral liquid 160 mg/5 mL, diphenhydrAMINE-zinc acetate, Gerhardt's butt cream, heparin, ondansetron (ZOFRAN) IV, senna-docusate   Assessment/Plan   1. Acute on chronic combined systolic and diastolic CHF: EF 32-95% on TEE 01/13/17 (down from 55% in March 2018 when first admitted). Likely tachy mediated from atrial fibrillation.  - She has biventricular failure R>>L.  - Tolerated iHD last night. Renal following for timing.    - Continue midodrine 15 mg TID.  2. Left frontal ICD with parafalcine SDH: Trace right midline shift. No CVA. Per Neuro her small ICH/SDH does not explain the degree of her AMS. Likely a component of metabolic encephalopathy due to renal failure. - Has right foot drop o/w stable - Neuro status stable and even improved this am. Off AC. Can consider DVT prophylaxis as tolerated. Hgb remains low but stable.      3. PAF: Failed DCCV 3/27, 4/5. Had DCCV with return to NSR on 01/20/17, went back into Afib 01/24/17.  - Rate stable on po amio 200 mg BID. No change.   - No longer a candidate for DCCV as she is off anticoagulation due to [place].  4. Acute on chronic kidney disease => ESRD - Tolerated iHD 02/02/17. Renal following for timing.     5. Obesity hypoventilation syndrome 6. Septic shock:   - Completed ABX  01/26/17.   - Repeat BCx NGTD. Afebrile.    7. PAH with acute RV failure/cor pulmonale: WHO  group II and III.  - Severe. ? End stage. No change to current plan.   8. Acute respiratory failure: - Stable on [place] 02.  No change.  9. Anemia:  - Hgb 7.9. Low but stable. No overt bleeding.  s/p 2 UPRBCs 01/30/17.  10. NSVT:  - Relatively quiescent. Now on po amio. No change.   11. Thrombocytopenia: 2/2 DIC - SR assay negative -PLTs 177. Stable.     12. Tube feeds  - Passed swallow test and now on diet/po meds. No change.  13. Hyperkalemia - Resolved with HD    Length of Stay: [place], [place]  02/03/2017, 8:31 AM Advanced Heart Failure Team  Pager [phone] M-F 7am-4pm.  Please contact [facility] for night-coverage after hours (4p -7a ) and weekends on [email or web address]  Patient seen and examined with the above-signed Advanced Practice Provider and/or Housestaff. I personally reviewed laboratory data, imaging studies and relevant notes. I independently examined the patient and formulated the important aspects of the plan. I have edited the note to reflect any of my changes or salient points. I have personally discussed the plan with the patient and/or family.  She continues to improve. Now transferred to floor. Tolerated HD yesterday without difficulty. BP remains stable. Remains in AF but rate well controlled on po amio. No AC due to SDH. Begin rehab.   Glori Bickers, MD  3:27 PM

## 2017-02-04 DIAGNOSIS — N184 Chronic kidney disease, stage 4 (severe): Secondary | ICD-10-CM

## 2017-02-04 DIAGNOSIS — I480 Paroxysmal atrial fibrillation: Secondary | ICD-10-CM

## 2017-02-04 LAB — CBC WITH DIFFERENTIAL/PLATELET
BASOS PCT: 1 %
Basophils Absolute: 0.1 10*3/uL (ref 0.0–0.1)
EOS PCT: 2 %
Eosinophils Absolute: 0.3 10*3/uL (ref 0.0–0.7)
HCT: 27.3 % — ABNORMAL LOW (ref 36.0–46.0)
Hemoglobin: 8 g/dL — ABNORMAL LOW (ref 12.0–15.0)
Lymphocytes Relative: 9 %
Lymphs Abs: 1.2 10*3/uL (ref 0.7–4.0)
MCH: 27.2 pg (ref 26.0–34.0)
MCHC: 29.3 g/dL — ABNORMAL LOW (ref 30.0–36.0)
MCV: 92.9 fL (ref 78.0–100.0)
MONO ABS: 0.7 10*3/uL (ref 0.1–1.0)
Monocytes Relative: 5 %
NEUTROS PCT: 83 %
Neutro Abs: 11.5 10*3/uL — ABNORMAL HIGH (ref 1.7–7.7)
Platelets: 170 10*3/uL (ref 150–400)
RBC: 2.94 MIL/uL — ABNORMAL LOW (ref 3.87–5.11)
RDW: 37.9 % — AB (ref 11.5–15.5)
WBC: 13.8 10*3/uL — ABNORMAL HIGH (ref 4.0–10.5)

## 2017-02-04 LAB — RENAL FUNCTION PANEL
Albumin: 2.6 g/dL — ABNORMAL LOW (ref 3.5–5.0)
Anion gap: 15 (ref 5–15)
BUN: 61 mg/dL — AB (ref 6–20)
CALCIUM: 8.7 mg/dL — AB (ref 8.9–10.3)
CHLORIDE: 99 mmol/L — AB (ref 101–111)
CO2: 24 mmol/L (ref 22–32)
CREATININE: 4.17 mg/dL — AB (ref 0.44–1.00)
GFR calc non Af Amer: 11 mL/min — ABNORMAL LOW (ref >60)
GFR, EST AFRICAN AMERICAN: 13 mL/min — AB (ref >60)
Glucose, Bld: 90 mg/dL (ref 65–99)
Phosphorus: 7.3 mg/dL — ABNORMAL HIGH (ref 2.5–4.6)
Potassium: 4.2 mmol/L (ref 3.5–5.1)
SODIUM: 138 mmol/L (ref 135–145)

## 2017-02-04 LAB — GLUCOSE, CAPILLARY
GLUCOSE-CAPILLARY: 114 mg/dL — AB (ref 65–99)
GLUCOSE-CAPILLARY: 84 mg/dL (ref 65–99)
GLUCOSE-CAPILLARY: 85 mg/dL (ref 65–99)
Glucose-Capillary: 91 mg/dL (ref 65–99)
Glucose-Capillary: 106 mg/dL — ABNORMAL HIGH (ref 65–99)

## 2017-02-04 MED ORDER — LANTHANUM CARBONATE 500 MG PO CHEW
1000.0000 mg | CHEWABLE_TABLET | Freq: Three times a day (TID) | ORAL | Status: DC
  Administered 2017-02-05 – 2017-02-10 (×12): 1000 mg via ORAL
  Filled 2017-02-04 (×14): qty 2

## 2017-02-04 MED ORDER — IOPAMIDOL (ISOVUE-300) INJECTION 61%
INTRAVENOUS | Status: AC
  Filled 2017-02-04: qty 30

## 2017-02-04 NOTE — Progress Notes (Signed)
Advanced Heart Failure Rounding Note  PCP: Juluis Mire, NP  Primary Cardiologist: Dr. Gwenlyn Found   Subjective:    s/p failed DCCV 3/28 and 4/5.   01/20/17 - successful DCCV to NSR, lethargic and with garbled speech. CT head showed left frontal intracranial hemorrhage.   01/21/17 MRI head with small left frontal intracerebral hemorrhage with parafalcine small SDH, trace rightward midline shift. No CVA. Intubated for airway control.   CVVHD stopped 01/16/17, restarted on 4/14.  Went back into Afib 4/16.   Tolerated iHD yesterday. Feels ok today, wants to get stronger. Back hurts.  Worked with PT but unable to stand yesterday. CVP 10.  Objective:   Weight Range: 275 lb 9.2 oz (125 kg) Body mass index is 41.9 kg/m.   Vital Signs:   Temp:  [97.7 F (36.5 C)-97.9 F (36.6 C)] 97.8 F (36.6 C) (04/27 0752) Pulse Rate:  [91-102] 91 (04/27 0752) Resp:  [14-22] 21 (04/27 0752) BP: (109-144)/(61-79) 110/61 (04/27 0403) SpO2:  [94 %-97 %] 97 % (04/27 0752) Last BM Date: 02/03/17  Weight change: Filed Weights   02/02/17 1435 02/02/17 1845 02/03/17 0404  Weight: 268 lb 15.4 oz (122 kg) 266 lb 12.1 oz (121 kg) 275 lb 9.2 oz (125 kg)    Intake/Output:   Intake/Output Summary (Last 24 hours) at 02/04/17 0826 Last data filed at 02/03/17 1825  Gross per 24 hour  Intake              680 ml  Output                0 ml  Net              680 ml    CVP 10 Physical Exam: General: Obese female, lying in bed. NAD HEENT: Normal anicteric  Neck: Supple. JVP to jaw. No bruits or masses.   Cor: PMI not-palpable. Irregularly irregular. Lungs: Clear, normal effort.  No wheezing.  Extremities: Warm. No edema   Neuro: Alert and oriented x 3 this am. Cranial nerves grossly intact.+ R foot drop  Telemetry: Personally reviewed, AF 80-100s.    Labs: CBC  Recent Labs  02/03/17 0402 02/04/17 0550  WBC 13.8* 13.8*  NEUTROABS 12.0* 11.5*  HGB 7.9* 8.0*  HCT 27.5* 27.3*  MCV 92.3  92.9  PLT 177 856   Basic Metabolic Panel  Recent Labs  02/03/17 0402 02/04/17 0550  NA 138  137 138  K 4.3  3.9 4.2  CL 101  99* 99*  CO2 25  26 24   GLUCOSE 109*  108* 90  BUN 43*  42* 61*  CREATININE 2.78*  2.83* 4.17*  CALCIUM 8.3*  8.5* 8.7*  MG 2.1  --   PHOS 5.8*  5.9* 7.3*   Liver Function Tests  Recent Labs  02/03/17 0402 02/04/17 0550  ALBUMIN 2.4* 2.6*    BNP: BNP (last 3 results)  Recent Labs  09/18/16 1126 11/07/16 1633 12/26/16 1643  BNP 991.0* 490.4* 1,116.0*    Transthoracic Echocardiography 01/11/17 Study Conclusions  - Left ventricle: The cavity size was mildly dilated. Wall   thickness was increased in a pattern of mild LVH. Left   ventricular geometry showed evidence of eccentric hypertrophy.   Systolic function was moderately reduced. The estimated ejection   fraction was in the range of 35% to 40%. Moderate diffuse   hypokinesis with no identifiable regional variations. Acoustic   contrast opacification revealed no evidence ofthrombus. - Ventricular septum: The  contour showed diastolic flattening.   These changes are consistent with RV volume overload. - Aortic valve: There was mild to moderate regurgitation directed   centrally in the LVOT. - Mitral valve: There was mild to moderate regurgitation directed   eccentrically and posteriorly. - Left atrium: The atrium was moderately dilated. - Right ventricle: The cavity size was severely dilated. Wall   thickness was normal. Systolic function was mildly reduced. - Right atrium: The atrium was severely dilated. - Tricuspid valve: There was malcoaptation of the valve leaflets.   There was moderate-severe regurgitation. - Pulmonary arteries: Systolic pressure was severely increased. PA   peak pressure: 71 mm Hg (S). - Pericardium, extracardiac: A trivial pericardial effusion was   identified posterior to the heart.    Medications:     Scheduled Medications: . amiodarone   200 mg Oral BID  . darbepoetin (ARANESP) injection - NON-DIALYSIS  150 mcg Subcutaneous Q Mon-1800  . [START ON 02/06/2017] hydrocortisone sod succinate (SOLU-CORTEF) inj  12.5 mg Intravenous Q12H  . hydrocortisone sod succinate (SOLU-CORTEF) inj  25 mg Intravenous Q6H  . insulin aspart  2-6 Units Subcutaneous Q4H  . mouth rinse  15 mL Mouth Rinse BID  . midodrine  15 mg Oral TID WC  . pantoprazole  40 mg Oral Daily    Infusions:   PRN Medications: acetaminophen (TYLENOL) oral liquid 160 mg/5 mL, diphenhydrAMINE-zinc acetate, Gerhardt's butt cream, heparin, ondansetron (ZOFRAN) IV, senna-docusate, simethicone   Assessment/Plan   1. Acute on chronic combined systolic and diastolic CHF: EF 01-75% on TEE 01/13/17 (down from 55% in March 2018 when first admitted). Likely tachy mediated from atrial fibrillation.  - She has biventricular failure R>>L.  - Tolerated iHD.  - Continue midodrine 15 mg TID.  2. Left frontal ICD with parafalcine SDH: Trace right midline shift. No CVA. Per Neuro her small ICH/SDH does not explain the degree of her AMS. Likely a component of metabolic encephalopathy due to renal failure. - Has right foot drop o/w stable - Neuro status stable and even improved this am. Off AC. Can consider DVT prophylaxis as tolerated. Hgb remains low but stable.    - Continue current medical management.  3. PAF: Failed DCCV 3/27, 4/5. Had DCCV with return to NSR on 01/20/17, went back into Afib 01/24/17.  - Continue po amio 200 mg BID. No change.   - No longer a candidate for DCCV as she is off anticoagulation due to [place].  - Rate controlled.  4. Acute on chronic kidney disease => ESRD - Tolerated iHD 02/02/17.  -  Plan for HD today.  5. Obesity hypoventilation syndrome 6. Septic shock:   - Completed ABX  01/26/17.   - Repeat BCx NGTD. Afebrile.    - No change.  7. 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  - Severe. ? End stage.  - No change to current plan.    8. Acute respiratory failure: - Stable on [place] 02.   - Improved, no change to current plan  9. Anemia:  - Hgb 7.9. Low but stable. No overt bleeding.  s/p 2 UPRBCs 01/30/17.  10. NSVT:  - Relatively quiescent. Now on po amio.  - Continue current medical management.  11. Thrombocytopenia: 2/2 DIC - SR assay negative -PLTs 177. Stable.   - No change to plan.    12. Tube feeds  - Passed swallow test and now on diet/po meds.  - No change to current plan.  13.  Hyperkalemia - Resolved with HD    Length of Stay: [place], NP  02/04/2017, 8:26 AM Advanced Heart Failure Team  Pager [phone] M-F 7am-4pm.  Please contact [facility] for night-coverage after hours (4p -7a ) and weekends on [email or web address]  Patient seen and examined with Jettie Booze, NP. We discussed all aspects of the encounter. I agree with the assessment and plan as stated above.   Remains stable. Tolerating IHD, BP improved. CVP 10 today.   Remains in chronic AF. Rate controlled on po amio. No AC due to SDH.   We will see again Monday. Please call with questions.   Glori Bickers, MD  5:20 PM

## 2017-02-04 NOTE — Progress Notes (Signed)
Pt complains of lower abdominal pain, bilaterally,  Abdomen soft, nt on exam, no guarding, no rebound.  CT scan Abd/pelvis ordered.

## 2017-02-04 NOTE — Consult Note (Signed)
ASSESSMENT & PLAN   END-STAGE RENAL DISEASE: Her vein map is pending. She has a diminished radial pulse in the right hand I cannot palpate a left radial pulse. Therefore I will also order an upper extremity arterial Doppler study. She has an ischemic index finger on the left and therefore I would be reluctant to place access in the left arm. Pending the results of her vein map and arterial Doppler study we could potentially schedule her for placement of a right arterial venous fistula or arteriovenous graft next week. She has a functioning tunneled dialysis catheter and dialyzes on Monday Wednesdays and Fridays.  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   REASON FOR CONSULT:    Evaluate for hemodialysis access.  HPI:   Paige Marshall is a 61 y.o. female who was admitted on 12/26/2016 with shortness of breath. The patient developed septic shock and was on pressors which she has now been off for more than 24 hours. She tells me that she just recently got off of the ventilator. She had VRE bacteremia. She has a history of chronic kidney disease but developed an uric acute kidney failure secondary to persistent shock.  She has multiple medical list use including VRE bacteremia which has resolved, paroxysmal atrial fibrillation with acute on chronic combined systolic and diastolic congestive heart failure, and thrombocytopenia which has resolved.  Of note hematology did follow the patient and she was not felt to have heparin-induced thrombocytopenia. It was felt that her thrombocytopenia was likely related related to DIC. She did undergo successful cardioversion to normal sinus rhythm.   In reviewing the records it looks like a CT scan of the head showed a left frontal intracranial  hemorrhage.  Past Medical History:  Diagnosis Date  . Asthma   . Chronic combined systolic and diastolic CHF (congestive heart failure) ([place])   . CKD (chronic kidney disease), stage III   . COPD (chronic obstructive pulmonary disease) ([place])   . Degenerative joint disease of both lower legs   . Former smoker   . Gout    hx in "both knees" (01/14/2015)  . H. pylori infection 01/03/2014   +breath test.    . High cholesterol   . Hypertension   . Morbid obesity ([place])   . NICM (nonischemic cardiomyopathy) ([place])    a. LHC 01/2015: normal cors, EF 45-50%. b. EF 50-55% in 08/2016. c. EF 33% 01/2017  . OSA (obstructive sleep apnea)   . Persistent atrial fibrillation ([place])   . Pneumonia X 2  . Pulmonary hypertension ([place])   . Rheumatoid arthritis ([place])   . Stroke [facility])     Family History  Problem Relation Age of Onset  . Heart disease Mother   . Cancer Father     colon  . Hypertension Other     SOCIAL HISTORY: Social History  Substance Use Topics  . Smoking status: Former Smoker    Packs/day: 0.33    Years: 35.00    Types: Cigarettes    Quit date: 10/11/2008  . Smokeless tobacco: Never Used  . Alcohol use No     Comment: 01/14/2015 "~ 4, 24oz cans beer/wk"    Allergies  Allergen Reactions  . Penicillins Hives, Itching, Swelling and Other (See Comments)    Tongue swelling Has patient had a PCN reaction causing immediate  rash, facial/tongue/throat swelling, SOB or lightheadedness with hypotension: Yes  Clarified with the patient her penicillin allergy. Pt has tolerated cefepime and ampicillin.    . Citrus Hives    Current Facility-Administered Medications  Medication Dose Route Frequency Provider Last Rate Last Dose  . acetaminophen (TYLENOL) solution 650 mg  650 mg Per Tube Q6H PRN Chesley Mires, MD      . amiodarone (PACERONE) tablet 200 mg  200 mg Oral BID Jolaine Artist, MD   200 mg at 02/03/17 2201  . Darbepoetin Alfa (ARANESP) injection 150 mcg  150 mcg Subcutaneous  Q Mon-1800 Madelon Lips, MD   150 mcg at 01/31/17 1824  . diphenhydrAMINE-zinc acetate (BENADRYL) 2-0.1 % cream   Topical Daily PRN Juanito Doom, MD   1 application at 79/02/40 2138  . Gerhardt's butt cream   Topical Daily PRN Juanito Doom, MD      . heparin injection 2,500 Units  20 Units/kg Dialysis PRN Donato Heinz, MD      . Derrill Memo ON 02/06/2017] hydrocortisone sodium succinate (SOLU-CORTEF) 100 MG injection 12.5 mg  12.5 mg Intravenous Q12H Tasrif Ahmed, MD      . hydrocortisone sodium succinate (SOLU-CORTEF) 100 MG injection 25 mg  25 mg Intravenous Q6H Tasrif Ahmed, MD   25 mg at 02/04/17 0536  . insulin aspart (novoLOG) injection 2-6 Units  2-6 Units Subcutaneous Q4H Omar Person, NP   2 Units at 02/02/17 [phone]  . lanthanum (FOSRENOL) chewable tablet 1,000 mg  1,000 mg Oral TID WC Donato Heinz, MD      . MEDLINE mouth rinse  15 mL Mouth Rinse BID Juanito Doom, MD   15 mL at 02/03/17 2203  . midodrine (PROAMATINE) tablet 15 mg  15 mg Oral TID WC Jolaine Artist, MD   15 mg at 02/04/17 0958  . ondansetron (ZOFRAN) injection 4 mg  4 mg Intravenous Q6H PRN Norman Herrlich, MD      . pantoprazole (PROTONIX) EC tablet 40 mg  40 mg Oral Daily Wynell Balloon, RPH   40 mg at 02/03/17 0950  . senna-docusate (Senokot-S) tablet 2 tablet  2 tablet Oral QHS PRN Alexa Angela Burke, MD      . simethicone (MYLICON) chewable tablet 80 mg  80 mg Oral Q6H PRN Rush Farmer, MD   80 mg at 02/03/17 1440    REVIEW OF SYSTEMS: Valu.Nieves ] denotes positive finding; [  ] denotes negative finding CARDIOVASCULAR:  [ ]  chest pain   [ ]  chest pressure   [ ]  palpitations   [ ]  orthopnea   Valu.Nieves ] dyspnea on exertion   [ ]  claudication   [ ]  rest pain   [ ]  DVT   [ ]  phlebitis PULMONARY:   [ ]  productive cough   [ ]  asthma   [ ]  wheezing NEUROLOGIC:   Valu.Nieves ] weakness  [ ]  paresthesias  [ ]  aphasia  [ ]  amaurosis  [ ]  dizziness HEMATOLOGIC:   [ ]  bleeding problems   [ ]  clotting  disorders MUSCULOSKELETAL:  [ ]  joint pain   [ ]  joint swelling [ ]  leg swelling GASTROINTESTINAL: [ ]   blood in stool  [ ]   hematemesis GENITOURINARY:  [ ]   dysuria  [ ]   hematuria PSYCHIATRIC:  [ ]  history of major depression INTEGUMENTARY:  [ ]  rashes  [ ]  ulcers CONSTITUTIONAL:  [ ]  fever   [ ]  chills  PHYSICAL EXAM:   Vitals:  02/04/17 0003 02/04/17 0403 02/04/17 0752 02/04/17 1104  BP: 118/79 110/61  125/64  Pulse:  99 91 (!) 105  Resp: (!) 22 18 (!) 21 19  Temp: 97.9 F (36.6 C) 97.7 F (36.5 C) 97.8 F (36.6 C) 97.7 F (36.5 C)  TempSrc: Oral Oral Oral Oral  SpO2:  96% 97% 100%  Weight:      Height:       Body mass index is 41.9 kg/m. GENERAL: The patient is a well-nourished female, in no acute distress. The vital signs are documented above. CARDIAC: There is a regular rate and rhythm.  VASCULAR: I do not detect carotid bruits. She has a palpable but slightly diminished right radial pulse. I cannot palpate a left radial pulse. She has palpable dorsalis pedis pulses. PULMONARY: There is good air exchange bilaterally without wheezing or rales. ABDOMEN: Soft and non-tender with normal pitched bowel sounds.  MUSCULOSKELETAL: She has ischemic changes to the left index finger. NEUROLOGIC: No focal weakness or paresthesias are detected. SKIN: There are no ulcers or rashes noted. PSYCHIATRIC: The patient has a normal affect.  DATA:    Lab Results  Component Value Date   WBC 13.8 (H) 02/04/2017   HGB 8.0 (L) 02/04/2017   HCT 27.3 (L) 02/04/2017   MCV 92.9 02/04/2017   PLT 170 02/04/2017   Lab Results  Component Value Date   NA 138 02/04/2017   K 4.2 02/04/2017   CL 99 (L) 02/04/2017   CO2 24 02/04/2017   Lab Results  Component Value Date   CREATININE 4.17 (H) 02/04/2017   Lab Results  Component Value Date   INR 1.26 01/24/2017   INR 1.33 01/23/2017   INR 1.38 01/23/2017   Lab Results  Component Value Date   HGBA1C  10/02/2009    5.6 (NOTE) The ADA  recommends the following therapeutic goal for glycemic control related to Hgb A1c measurement: Goal of therapy: <6.5 Hgb A1c  Reference: American Diabetes Association: Clinical Practice Recommendations 2010, Diabetes Care, 2010, 33: (Suppl  1).   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  MRI: MRI of the brain on 01/21/2017 shows a left-sided subdural hematoma which is an changed from earlier CT scan. There is a trace rightward midline shift. There is a trace right frontal subdural hematoma with no acute infarct.  [name] [facility]: [place] Office: [phone]

## 2017-02-04 NOTE — Progress Notes (Signed)
[name], female   DOB: December 03, 1955, 61 y.o.   MRN: [id number]                                                                PROGRESS NOTE                                                                                                                                                                                                             Patient Demographics:    Paige Marshall, is a 61 y.o. female, DOB - 02/26/1956, QQP:[id number]  Admit date - 12/26/2016   Admitting Physician Lucious Groves, DO  Outpatient Primary MD for the patient is EDWARDS, Milford Cage, NP  LOS - 40  Outpatient Specialists:   Quay Burow (cardiologist)  Chief Complaint  Patient presents with  . Respiratory Distress       Brief Narrative     61 y.o. 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  Patient has had a long history of poorly controlled heart failure, treated by multiple episodes of hypervolemia. She has a baseline poor renal function which is completed her therapy. For the last several months patient has been living in a skilled nursing facility after she suffered a broken fibula last year, and she has had multiple admissions from this facility to the hospital for acute on chronic heart failure exacerbations. Was recently she's been followed by Dr. Alvester Chou her cardiologist with concern of volume overload. Her diuretic regimen was increased to 80 mg IV twice a day with a creatinine ~2.0 at that point. 1 week later when she had follow-up, she was noted to have worsening kidney function which was thought to be due to her increased diuretic regimen. She was again transitioned to by mouth Lasix 80 mg twice a day on 12/20/2016. Patient reports that since that time she feels like she has been slowly gaining fluid. Over the last 2 days patient notes a 15 pound weight gain  as recorded by daily weights. Last night patient reports that she had acute onset of shortness of breath. Today she is in mild respiratory distress sitting in her bed with nasal cannula oxygen.  Patient denies  any chest pain, palpitations. She reports that when she misses her antiarrhythmic medications she does experience palpitations but has not noted this recently. She denies any significant changes to urination. She reports that she tends to urinate more frequently when she is on higher doses of the Lasix but has not had any significant oliguria recently. Patient does also report some diffuse abdominal tenderness which she describes as tightness, "a band around her stomach."  In the ED pt was acutely SOB with tachypnea to 30 and tachycardia in the 110s. She was found to have hyperkalemia to 6.3 and AKI w/ SCr 3.03. She was temporized with IV Lasix and Insulin + Dextrose. BP remained stable. Her BNP was significantly elevated at 1100 up from her baseline of 200-400.   Pt was admitted for Acute CHF, pt was started on iv lasix, and metolazone and milrinone.  Due to Acute on CRF, pt was tx with kaxelate. And due to Afib with RVR, =>  Important events: Renal ultrasound 3/18=> no hydronephrosis, moderate lower abdominal ascites Milrinone 0.68mg 3/19=> Amiodarone gtt 3/21 HD catheter 3/21 CRRT 3/22 Cardizem gtt 3/22 2units prbc 3/24 Cardioversion x2 3/27  => converstion to MAT no complications  Tunnelled Dialysis catheter placement 3/28  IV abradine x3 3/29 Electophysiology consult 3/29 (Carleene OverlieTaylor)=>  R heart cath 3/30 (Linna HoffBensimhon)=>  Moderate to severe PAH w RV failure despite milrinone therapy, normal left sided pressure after100+ lbs of volume removal with CVVHD, low PA sat suspect CO is significantly overestimated Transfer to SDU 3/30  Rapid Response 4/1=> fever, hypotension, Afib with rvr Transfer to ICU Sepsis 4/1= Vanco /Cefepime initiated, Neo gtt 4/1=> R femoral art line  4/1 Amiodarone 1537miv x1 and gtt  4/1, cardizem 307m6h 4/1 Blood culture + VRE  (4/1), ID consulted => start Daptomycin 4/1 PM, discontinue Vanco/cefepime L IJ 4/2 Blood culture 4/1  => E. Faecalis R to vancomycin, S to ampicilin, gentamicin, linezolid Daptomycin discontinued 4/3  => ampicillin started Cardiac echo 4/3  => EF 35-40%, mild to mod AR, mild-mod MR, mod-sev TR TEE w direct cardioversion 4/5  (EF 25-30%, global hk,  Mild-mod MR, severe TR, no vegetation seen, no PFO /ASD [facility] Neo stopped 4/9 Amio gtt stopped 4/10=> Amio po   Transfer to SDU 4/12  Cardioversion x3 4/12=> NSR PACU => Hypotension 4/12  => Levophed initated Transfer to ICU Acute encephalopathy 4/12 CT brain 4/12=>  intracranial hemorrhage, Heparin/coumadin stopped 4/12, Vitamin K/Kcentra ETT 4/13 R femoral arterial line 4/13 EEG 01/23/67=>tabolic encephalopathy, no seizure MRI brain /MRA 4/14=> L subdural hematoma, trace right frontal subdural hematoma, no [id number] posterior communicating artery aneurysm Thrombocytopenia 4/16  Plt=39 secondary to DIC Oncology consulted 4/16 Afib 4/16 Amiodarone gtt inititated Relative adrenal insufficiency, solucortef initiated on 4/19 Extubated 4/19 Hypotension 4/21, neo reinitiated Transfuse Prbc 4/22 Amiodarone gtt=> amiodarone po 4/24 Neo stopped 4/25  ESRD on HD, vascular surgery consulted 4/27 for AVF vs AVG     Subjective:    Paige Marshall has rectal tube in place,  Sitting in bed pleasant.  , No headache, No chest pain, No abdominal pain - No Nausea, No new weakness tingling or numbness, No Cough - SOB.    Assessment  & Plan :    Principal Problem:   Cardiogenic shock (HCC)Butlertive Problems:   Essential hypertension   Obstructive sleep apnea   Morbid obesity (HCC)   Obesity hypoventilation syndrome (HCC)   COPD mixed  type ([place])   Chronic respiratory failure with hypoxia (HCC)   Acute kidney injury  superimposed on CKD ([place])   Acute on chronic combined systolic and diastolic CHF (congestive heart failure) (HCC)   Thoracic aortic atherosclerosis (HCC)   Cardiorenal syndrome   VRE bacteremia   PAH (pulmonary artery hypertension) (HCC)   Chronic atrial fibrillation (HCC)   Left-sided nontraumatic intracerebral hemorrhage ([place])   Encounter for central line placement   SDH (subdural hematoma) (HCC)   Hygroma   Thrombocytopenia (HCC)   DIC (disseminated intravascular coagulation) (HCC)   HIT (heparin-induced thrombocytopenia) (HCC)   Acute on chronic combined CHF. Cardiogenic shock - off pressors, on midodrine. Continue solucortef for relative adrenal insufficiency  A fib with RVR. cont amiodarone po, no anticoagulation due to [place] cardiology input  Acute renal failure Tolerated intermittent HD 4/23 and 4/25, will receive HD 4/27 Appreciate nephrology input Hopefully will continue to maintain her BP off pressors and tolerate iHD.   Acute on chronic respiratory failure secondary  to Hx of COPD, OSA, OHS + CHF -stable  Bipap qHS Cont o2 [place]  Hypoxemia titrate o2 sat 89-92%  Dysphagia tolerating oral diet.   Acute on Chronic Anemia - stable after few units of transfusion intermittently Thrombocytopenia -resolved  likely DIC - improving  Appreciate oncology input  Cbc in am  Relative adrenal insufficiency. Stable  Acute metabolic encephalopathy -resolved, was likely from uremia and hypotension and Recent SDH  VRE bacteremia, TEE negative for endocarditis Off Abx (completed 19 days)   Code status: limited except pressor.    Lab Results  Component Value Date   PLT PENDING 02/04/2017      Anti-infectives    Start     Dose/Rate Route Frequency Ordered Stop   01/22/17 1800  ampicillin (OMNIPEN) 2 g in sodium chloride 0.9 % 50 mL IVPB     2 g 150 mL/hr over 20 Minutes Intravenous Every 8 hours 01/22/17 1053 01/26/17 1832   01/17/17 2012   ampicillin (OMNIPEN) 2 g in sodium chloride 0.9 % 50 mL IVPB  Status:  Discontinued     2 g 150 mL/hr over 20 Minutes Intravenous Every 12 hours 01/17/17 1253 01/22/17 1053   01/13/17 1100  cefTRIAXone (ROCEPHIN) 2 g in dextrose 5 % 50 mL IVPB     2 g 100 mL/hr over 30 Minutes Intravenous Every 12 hours 01/13/17 1056 01/26/17 2247   01/11/17 2000  DAPTOmycin (CUBICIN) 1,000 mg in sodium chloride 0.9 % IVPB  Status:  Discontinued     1,000 mg 240 mL/hr over 30 Minutes Intravenous  Once 01/10/17 1249 01/11/17 1000   01/11/17 1600  ampicillin (OMNIPEN) 2 g in sodium chloride 0.9 % 50 mL IVPB  Status:  Discontinued     2 g 150 mL/hr over 20 Minutes Intravenous Every 8 hours 01/11/17 1000 01/17/17 1253   01/09/17 2200  ceFEPIme (MAXIPIME) 1 g in dextrose 5 % 50 mL IVPB  Status:  Discontinued     1 g 100 mL/hr over 30 Minutes Intravenous Every 24 hours 01/09/17 0033 01/09/17 1118   01/09/17 2200  ceFEPIme (MAXIPIME) 2 g in dextrose 5 % 50 mL IVPB  Status:  Discontinued     2 g 100 mL/hr over 30 Minutes Intravenous Every 24 hours 01/09/17 1118 01/09/17 1130   01/09/17 2200  ceFEPIme (MAXIPIME) 2 g in dextrose 5 % 50 mL IVPB  Status:  Discontinued     2 g 100 mL/hr over 30 Minutes Intravenous  Every 12 hours 01/09/17 1130 01/09/17 1914   01/09/17 1900  DAPTOmycin (CUBICIN) 1,000 mg in sodium chloride 0.9 % IVPB     1,000 mg 240 mL/hr over 30 Minutes Intravenous  Once 01/09/17 1852 01/09/17 2013   01/09/17 0045  vancomycin (VANCOCIN) 2,000 mg in sodium chloride 0.9 % 500 mL IVPB     2,000 mg 250 mL/hr over 120 Minutes Intravenous  Once 01/09/17 0033 01/09/17 0318   01/09/17 0045  ceFEPIme (MAXIPIME) 2 g in dextrose 5 % 50 mL IVPB     2 g 100 mL/hr over 30 Minutes Intravenous  Once 01/09/17 0033 01/09/17 0149   01/06/17 0545  vancomycin (VANCOCIN) 1,500 mg in sodium chloride 0.9 % 500 mL IVPB     1,500 mg 250 mL/hr over 120 Minutes Intravenous To Radiology 01/06/17 0533 01/06/17 1805         Objective:   Vitals:   02/03/17 1626 02/03/17 2034 02/04/17 0003 02/04/17 0403  BP: 113/71 119/68  110/61  Pulse:  (!) 101  99  Resp: 16 16  18   Temp:  97.9 F (36.6 C) 97.9 F (36.6 C) 97.7 F (36.5 C)  TempSrc:  Oral Oral Oral  SpO2:  96%  96%  Weight:      Height:        Wt Readings from Last 3 Encounters:  02/03/17 125 kg (275 lb 9.2 oz)  12/22/16 (!) 177.8 kg (392 lb)  12/15/16 (!) 182.8 kg (403 lb)     Intake/Output Summary (Last 24 hours) at 02/04/17 2671 Last data filed at 02/03/17 1825  Gross per 24 hour  Intake              680 ml  Output                0 ml  Net              680 ml     Physical Exam  Awake Alert, Oriented X 3, No new F.N deficits, Normal affect .AT,PERRAL Supple Neck,No JVD, No cervical lymphadenopathy appriciated.  Symmetrical Chest wall movement, Good air movement bilaterally, CTAB RRR,No Gallops,Rubs or new Murmurs, No Parasternal Heave +ve B.Sounds, Abd Soft, No tenderness, No organomegaly appriciated, No rebound - guarding or rigidity. No Cyanosis, Clubbing or edema, No new Rash or bruise      Data Review:    CBC  Recent Labs Lab 01/31/17 0515 02/01/17 0416 02/02/17 0453 02/03/17 0402 02/04/17 0550  WBC 16.7* 18.7* 17.6* 13.8* 13.8*  HGB 7.8* 7.7* 7.8* 7.9* 8.0*  HCT 24.9* 25.8* 26.0* 27.5* 27.3*  PLT 127* 198 169 177 PENDING  MCV 85.9 88.7 89.7 92.3 92.9  MCH 26.9 26.5 26.9 26.5 27.2  MCHC 31.3 29.8* 30.0 28.7* 29.3*  RDW 30.6* 33.8* 35.8* 37.8* 37.9*  LYMPHSABS 1.5 1.1 1.2 0.8 PENDING  MONOABS 1.3* 1.5* 0.7 0.8 PENDING  EOSABS 0.0 0.0 0.2 0.1 PENDING  BASOSABS 0.2* 0.2* 0.2* 0.1 PENDING    Chemistries   Recent Labs Lab 02/01/17 0416 02/01/17 0417 02/02/17 0426 02/03/17 0402 02/04/17 0550  NA 139 140 136 138  137 138  K 4.3 4.4 4.8 4.3  3.9 4.2  CL 98* 98* 96* 101  99* 99*  CO2 25 25 23 25  26 24   GLUCOSE 107* 107* 103* 109*  108* 90  BUN 67* 70* 92* 43*  42* 61*  CREATININE 2.82* 2.83*  3.98* 2.78*  2.83* 4.17*  CALCIUM 8.3* 8.2* 8.7* 8.3*  8.5*  8.7*  MG 2.3  --   --  2.1  --    ------------------------------------------------------------------------------------------------------------------ No results for input(s): CHOL, HDL, LDLCALC, TRIG, CHOLHDL, LDLDIRECT in the last 72 hours.  Lab Results  Component Value Date   HGBA1C  10/02/2009    5.6 (NOTE) The ADA recommends the following therapeutic goal for glycemic control related to Hgb A1c measurement: Goal of therapy: <6.5 Hgb A1c  Reference: American Diabetes Association: Clinical Practice Recommendations 2010, Diabetes Care, 2010, 33: (Suppl  1).   ------------------------------------------------------------------------------------------------------------------ No results for input(s): TSH, T4TOTAL, T3FREE, THYROIDAB in the last 72 hours.  Invalid input(s): FREET3 ------------------------------------------------------------------------------------------------------------------ No results for input(s): VITAMINB12, FOLATE, FERRITIN, TIBC, IRON, RETICCTPCT in the last 72 hours.  Coagulation profile No results for input(s): INR, PROTIME in the last 168 hours.  No results for input(s): DDIMER in the last 72 hours.  Cardiac Enzymes No results for input(s): CKMB, TROPONINI, MYOGLOBIN in the last 168 hours.  Invalid input(s): CK ------------------------------------------------------------------------------------------------------------------    Component Value Date/Time   BNP 1,116.0 (H) 12/26/2016 1643    Inpatient Medications  Scheduled Meds: . amiodarone  200 mg Oral BID  . darbepoetin (ARANESP) injection - NON-DIALYSIS  150 mcg Subcutaneous Q Mon-1800  . [START ON 02/06/2017] hydrocortisone sod succinate (SOLU-CORTEF) inj  12.5 mg Intravenous Q12H  . hydrocortisone sod succinate (SOLU-CORTEF) inj  25 mg Intravenous Q6H  . insulin aspart  2-6 Units Subcutaneous Q4H  . mouth rinse  15 mL Mouth Rinse BID  .  midodrine  15 mg Oral TID WC  . pantoprazole  40 mg Oral Daily   Continuous Infusions: PRN Meds:.acetaminophen (TYLENOL) oral liquid 160 mg/5 mL, diphenhydrAMINE-zinc acetate, Gerhardt's butt cream, heparin, ondansetron (ZOFRAN) IV, senna-docusate, simethicone  Micro Results Recent Results (from the past 240 hour(s))  MRSA PCR Screening     Status: None   Collection Time: 01/26/17  4:00 AM  Result Value Ref Range Status   MRSA by PCR NEGATIVE NEGATIVE Final    Comment:        The GeneXpert MRSA Assay (FDA approved for NASAL specimens only), is one component of a comprehensive MRSA colonization surveillance program. It is not intended to diagnose MRSA infection nor to guide or monitor treatment for MRSA infections.   Gastrointestinal Panel by PCR , Stool     Status: None   Collection Time: 01/27/17 10:13 AM  Result Value Ref Range Status   Campylobacter species NOT DETECTED NOT DETECTED Final   Plesimonas shigelloides NOT DETECTED NOT DETECTED Final   Salmonella species NOT DETECTED NOT DETECTED Final   Yersinia enterocolitica NOT DETECTED NOT DETECTED Final   Vibrio species NOT DETECTED NOT DETECTED Final   Vibrio cholerae NOT DETECTED NOT DETECTED Final   Enteroaggregative E coli (EAEC) NOT DETECTED NOT DETECTED Final   Enteropathogenic E coli (EPEC) NOT DETECTED NOT DETECTED Final   Enterotoxigenic E coli (ETEC) NOT DETECTED NOT DETECTED Final   Shiga like toxin producing E coli (STEC) NOT DETECTED NOT DETECTED Final   Shigella/Enteroinvasive E coli (EIEC) NOT DETECTED NOT DETECTED Final   Cryptosporidium NOT DETECTED NOT DETECTED Final   Cyclospora cayetanensis NOT DETECTED NOT DETECTED Final   Entamoeba histolytica NOT DETECTED NOT DETECTED Final   Giardia lamblia NOT DETECTED NOT DETECTED Final   Adenovirus F40/41 NOT DETECTED NOT DETECTED Final   Astrovirus NOT DETECTED NOT DETECTED Final   Norovirus GI/GII NOT DETECTED NOT DETECTED Final   Rotavirus A NOT DETECTED  NOT DETECTED Final   Sapovirus (I, II,  IV, and V) NOT DETECTED NOT DETECTED Final  C difficile quick scan w PCR reflex     Status: None   Collection Time: 01/27/17 10:13 AM  Result Value Ref Range Status   C Diff antigen NEGATIVE NEGATIVE Final   C Diff toxin NEGATIVE NEGATIVE Final   C Diff interpretation No C. difficile detected.  Final  Culture, blood (Routine X 2) w Reflex to ID Panel     Status: None   Collection Time: 01/27/17  1:35 PM  Result Value Ref Range Status   Specimen Description BLOOD RIGHT HAND  Final   Special Requests IN PEDIATRIC BOTTLE Blood Culture adequate volume  Final   Culture NO GROWTH 5 DAYS  Final   Report Status 02/01/2017 FINAL  Final  Culture, blood (Routine X 2) w Reflex to ID Panel     Status: None   Collection Time: 01/27/17  1:35 PM  Result Value Ref Range Status   Specimen Description BLOOD LEFT ARM  Final   Special Requests IN PEDIATRIC BOTTLE Blood Culture adequate volume  Final   Culture NO GROWTH 5 DAYS  Final   Report Status 02/01/2017 FINAL  Final    Radiology Reports Dg Chest 1 View  Result Date: 01/21/2017 CLINICAL DATA:  Status post intubation today. EXAM: CHEST 1 VIEW COMPARISON:  Single-view of the chest earlier today. FINDINGS: New NG tube courses into the stomach and below the inferior margin of film. New endotracheal tube is in place with the tip at the level of clavicular heads in good position well above the carina. Right IJ approach dialysis catheter is again seen. Cardiomegaly and interstitial edema are unchanged. Atherosclerosis is noted. IMPRESSION: ETT and NG tube projecting good position. Cardiomegaly and mild interstitial edema. Electronically Signed   By: Inge Rise M.D.   On: 01/21/2017 12:01   Ct Head Wo Contrast  Result Date: 01/21/2017 CLINICAL DATA:  Slurred speech. Recent hospitalization for atrial fibrillation, on heparin. Altered mental status. EXAM: CT HEAD WITHOUT CONTRAST TECHNIQUE: Contiguous axial images  were obtained from the base of the skull through the vertex without intravenous contrast. COMPARISON:  01/20/2017. FINDINGS: Brain: Redemonstrated is a LEFT frontal mixed attenuation collection, biconvex on coronal imaging, with low attenuation fluid anteriorly, and hyperdense acute hemorrhage posteriorly. Maximum thickness of between 13 and 15 mm. Acute LEFT parafalcine subdural hemorrhage redemonstrated as well, small volume. No clear areas of cerebral infarction or vasogenic edema. 2 mm of LEFT-to-RIGHT shift. Vascular: No hyperdense vessel or unexpected calcification. Skull: Normal. Negative for fracture or focal lesion. Sinuses/Orbits: No acute finding. Other: None. IMPRESSION: Stable LEFT frontal mixed attenuation collections, as described above. 2 mm LEFT-to-RIGHT shift. I believe the collections could both be extra-axial, representing subdural/epidural hematoma/hygroma. Continued surveillance warranted. Patient could not cooperate for MRI. If clinically indicated, repeat could be performed with sedation. Electronically Signed   By: Staci Righter M.D.   On: 01/21/2017 10:18   Ct Head Wo Contrast  Addendum Date: 01/20/2017   ADDENDUM REPORT: 01/20/2017 16:18 ADDENDUM: Study discussed by telephone with Dr. Evette Doffing on the Internal Medicine Team On 01/20/2017 at 1545 hours. He advised that the patient has been hospitalized in the ICU and on heparin recently for a fib. This factor argues against the possibility of venous thrombosis, and in favor of a coagulopathy related bleed. No recent trauma. He plans a follow-up Brain MRI without and with contrast (I advised contrast even though the patient is recently on dialysis) when feasible. Electronically Signed  By: Genevie Ann M.D.   On: 01/20/2017 16:18   Result Date: 01/20/2017 CLINICAL DATA:  61 year old female with mental status changes since this morning and increased confusion throughout the day. Dialysis patient. EXAM: CT HEAD WITHOUT CONTRAST TECHNIQUE:  Contiguous axial images were obtained from the base of the skull through the vertex without intravenous contrast. COMPARISON:  Head CT without contrast 10/02/2009 FINDINGS: Brain: In 2010 the left frontal lobe appeared within normal limits. Now there is both a 3 cm oval area of hypodensity within the anterior left frontal lobe (series 3, image 17), and an adjacent mixed density rounded hemorrhage or mass like area of about 2.6 cm. There does appear to be a small volume of superimposed left parasagittal subdural hemorrhage up to 7 mm in thickness. There is not an area of vasogenic edema in the left frontal lobe. Outside of the mixed density abnormality in the anterior left frontal lobe gray and white matter differentiation appears normal throughout the brain. No significant intracranial mass effect. No ventriculomegaly. Normal basilar cisterns. Partially empty sella. No cortical encephalomalacia identified. Vascular: Aside from hyperdensity involving the left superior sagittal sinus at the area of presumed subdural blood there is no suspicious intracranial vascular hyperdensity. Skull: Hyperostosis of the calvarium. Small 6-7 mm oval lucent lesions in the left occipital bone (series 4, image 24) and left frontal bone (image 44) are identified. However, the left frontal bone lesion is unchanged from 2010, and the left occipital bone lesion was probably present at that time although much smaller. No definite destructive osseous lesion. Sinuses/Orbits: Visualized paranasal sinuses and mastoids are stable and well pneumatized. Other: No acute orbit or scalp soft tissue finding. IMPRESSION: 1. Unusual appearing mixed density mass like area along the surface of the anterior frontal lobe, with cytotoxic type edema as well as a rounded 2 cm area of hemorrhage. Apparent small volume superimposed nearby a left parafalcine subdural blood. 2. Etiology is unclear at this point with top differential considerations including:  trauma such as Hemorrhagic Contusion and SDH, Venous Infarct (superior cortical vein and/or short segment sagittal sinus thrombosis), and tumor. 3. Negative CT appearance of the brain elsewhere. 4. Recommend follow-up brain MRI, preferably without and with IV contrast, to further characterize. 5. Small lucent areas in the skull appear chronic and are probably benign - possibly hyperparathyroid or renal osteodystrophy related in the setting of chronic renal failure. Electronically Signed: By: Genevie Ann M.D. On: 01/20/2017 15:39   Mr Jodene Nam Head Wo Contrast  Result Date: 01/22/2017 CLINICAL DATA:  Subdural hematoma following recent cardioversion. EXAM: MRA HEAD WITHOUT CONTRAST TECHNIQUE: Angiographic images of the Circle of Willis were obtained using MRA technique without intravenous contrast. COMPARISON:  MRI brain 01/21/2017 FINDINGS: Source images again demonstrate the left extra-axial hemorrhages. The internal carotid arteries are within normal limits from the high cervical segments through the ICA termini bilaterally. 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 The A1 and M1 segments are normal. The right A1 segment is dominant. The anterior communicating artery is patent. The MCA bifurcations are intact. ACA and MCA branch vessels are within normal limits. The left vertebral artery slightly dominant to the right. The left PICA origin is below normal. The vertebrobasilar junction is normal. The right AICA is dominant. The basilar artery is within normal limits. The posterior cerebral arteries originate the basilar tip. PCA branch vessels are unremarkable. IMPRESSION: 1. 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 2. Otherwise normal MRA circle of Willis without significant  proximal stenosis or occlusion. No other aneurysms are present. Electronically Signed   By: San Morelle M.D.   On: 01/22/2017 08:38   Mr Brain Wo Contrast  Result Date: 01/21/2017 CLINICAL DATA:  Altered  mental status.  Intracranial hemorrhage. EXAM: MRI HEAD WITHOUT CONTRAST TECHNIQUE: Multiplanar, multiecho pulse sequences of the brain and surrounding structures were obtained without intravenous contrast. COMPARISON:  Head CT 01/21/2017 FINDINGS: The examination had to be discontinued prior to completion, and axial T1 and coronal T2 sequences were not obtained. Brain: Left-sided subdural hematoma is greatest over the frontal convexity and measures up to 12 mm in thickness, unchanged from the CT earlier today. The more anterior component is T1, T2, and FLAIR hyperintense and simpler in appearance, corresponding to the low-density fluid on CT. At the posterior aspect of this are more complex appearing blood products, corresponding to the hyperdense hemorrhage on CT. Minimal subdural hematoma extends posteriorly over the left cerebral convexity as well as into the middle cranial fossa over the temporal lobe and into the interhemispheric fissure. There is also trace subdural hematoma over the anterior right frontal lobe. The left-sided hematoma results in mild left cerebral hemispheric sulcal effacement and minimal rightward midline shift of 3 mm, unchanged. There is no acute infarct. No significant cerebral white matter disease or brain edema is identified. Cerebral volume is within normal limits for age. Vascular: Major intracranial vascular flow voids are preserved. Skull and upper cervical spine: Decreased bone marrow signal intensity likely reflects known underlying anemia. Sinuses/Orbits: Unremarkable orbits. Fluid in the nasopharynx. Clear paranasal sinuses. Trace bilateral mastoid fluid. Other: None. IMPRESSION: 1. Slightly incomplete noncontrast brain MRI. 2. Left-sided subdural hematoma, unchanged from earlier CT. Trace rightward midline shift. 3. Trace right frontal subdural hematoma. 4. No acute infarct. Electronically Signed   By: Logan Bores M.D.   On: 01/21/2017 15:53   Ir Fluoro Guide Cv Line  Right  Result Date: 01/06/2017 INDICATION: 61 year old female with renal failure EXAM: TUNNELED CENTRAL VENOUS HEMODIALYSIS CATHETER PLACEMENT WITH ULTRASOUND AND FLUOROSCOPIC GUIDANCE MEDICATIONS: Vancomycin 1.5 gm IV . The antibiotic was given in an appropriate time interval prior to skin puncture. ANESTHESIA/SEDATION: Moderate (conscious) sedation was employed during this procedure. A total of Versed 2 point mg and Fentanyl 100 mcg was administered intravenously. Moderate Sedation Time: 20 minutes. The patient's level of consciousness and vital signs were monitored continuously by radiology nursing throughout the procedure under my direct supervision. FLUOROSCOPY TIME:  Fluoroscopy Time: 0 minutes 12 seconds (2 mGy). COMPLICATIONS: None PROCEDURE: Informed written consent was obtained from the patient after a discussion of the risks, benefits, and alternatives to treatment. Questions regarding the procedure were encouraged and answered. The right neck and chest were prepped with chlorhexidine in a sterile fashion, and a sterile drape was applied covering the operative field. Maximum barrier sterile technique with sterile gowns and gloves were used for the procedure. A timeout was performed prior to the initiation of the procedure. After creating a small venotomy incision, a micropuncture kit was utilized to access the right internal jugular vein under direct, real-time ultrasound guidance after the overlying soft tissues were anesthetized with 1% lidocaine with epinephrine. Ultrasound image documentation was performed. The microwire was marked to measure appropriate internal catheter length. External tunneled length was estimated. A total tip to cuff length of 19 cm was selected. Skin and subcutaneous tissues of chest wall below the clavicle were generously infiltrated with 1% lidocaine for local anesthesia. A small stab incision was made with 11 blade  scalpel. The selected hemodialysis catheter was tunneled in  a retrograde fashion from the anterior chest wall to the venotomy incision. A guidewire was advanced to the level of the IVC and the micropuncture sheath was exchanged for a peel-away sheath. The catheter was then placed through the peel-away sheath with tips ultimately positioned within the superior aspect of the right atrium. Final catheter positioning was confirmed and documented with a spot radiographic image. The catheter aspirates and flushes normally. The catheter was flushed with appropriate volume heparin dwells. The catheter exit site was secured with a 0-Prolene retention suture. The venotomy incision was closed Derma bond and sterile dressing. Dressings were applied at the chest wall. Patient tolerated the procedure well and remained hemodynamically stable throughout. No complications were encountered and no significant blood loss encountered. IMPRESSION: Status post right IJ tunneled hemodialysis catheter, 19 cm tip to cuff. Catheter ready for use. Signed, Dulcy Fanny. Earleen Newport, DO [facility] Radiology Electronically Signed   By: Corrie Mckusick D.O.   On: 01/06/2017 17:38   Ir US Guide Vasc Access Right  Result Date: 01/06/2017 INDICATION: 61 year old female with renal failure EXAM: TUNNELED CENTRAL VENOUS HEMODIALYSIS CATHETER PLACEMENT WITH ULTRASOUND AND FLUOROSCOPIC GUIDANCE MEDICATIONS: Vancomycin 1.5 gm IV . The antibiotic was given in an appropriate time interval prior to skin puncture. ANESTHESIA/SEDATION: Moderate (conscious) sedation was employed during this procedure. A total of Versed 2 point mg and Fentanyl 100 mcg was administered intravenously. Moderate Sedation Time: 20 minutes. The patient's level of consciousness and vital signs were monitored continuously by radiology nursing throughout the procedure under my direct supervision. FLUOROSCOPY TIME:  Fluoroscopy Time: 0 minutes 12 seconds (2 mGy). COMPLICATIONS: None PROCEDURE: Informed  written consent was obtained from the patient after a discussion of the risks, benefits, and alternatives to treatment. Questions regarding the procedure were encouraged and answered. The right neck and chest were prepped with chlorhexidine in a sterile fashion, and a sterile drape was applied covering the operative field. Maximum barrier sterile technique with sterile gowns and gloves were used for the procedure. A timeout was performed prior to the initiation of the procedure. After creating a small venotomy incision, a micropuncture kit was utilized to access the right internal jugular vein under direct, real-time ultrasound guidance after the overlying soft tissues were anesthetized with 1% lidocaine with epinephrine. Ultrasound image documentation was performed. The microwire was marked to measure appropriate internal catheter length. External tunneled length was estimated. A total tip to cuff length of 19 cm was selected. Skin and subcutaneous tissues of chest wall below the clavicle were generously infiltrated with 1% lidocaine for local anesthesia. A small stab incision was made with 11 blade scalpel. The selected hemodialysis catheter was tunneled in a retrograde fashion from the anterior chest wall to the venotomy incision. A guidewire was advanced to the level of the IVC and the micropuncture sheath was exchanged for a peel-away sheath. The catheter was then placed through the peel-away sheath with tips ultimately positioned within the superior aspect of the right atrium. Final catheter positioning was confirmed and documented with a spot radiographic image. The catheter aspirates and flushes normally. The catheter was flushed with appropriate volume heparin dwells. The catheter exit site was secured with a 0-Prolene retention suture. The venotomy incision was closed Derma bond and sterile dressing. Dressings were applied at the chest wall. Patient tolerated the procedure well and remained hemodynamically  stable throughout. No complications were encountered and no significant blood loss encountered. IMPRESSION:  Status post right IJ tunneled hemodialysis catheter, 19 cm tip to cuff. Catheter ready for use. Signed, Dulcy Fanny. Earleen Newport, DO [facility] Radiology Electronically Signed   By: Corrie Mckusick D.O.   On: 01/06/2017 17:38   Dg Chest Port 1 View  Result Date: 01/27/2017 CLINICAL DATA:  Extubation. EXAM: PORTABLE CHEST 1 VIEW COMPARISON:  01/26/2017. FINDINGS: Interim extubation and removal of NG tube. Dual-lumen right IJ line and left IJ line stable position. Cardiomegaly with pulmonary vascular prominence and bilateral interstitial prominence consistent with CHF. Pneumonitis cannot be excluded . Interim slight improvement of left base aeration. No pneumothorax. IMPRESSION: 1. Interim extubation and removal of NG tube. Dual-lumen right IJ line left IJ line stable position. 2. Persistent cardiomegaly, pulmonary venous congestion, bilateral interstitial prominence suggesting CHF. No change. 3. Interim improved aeration left lung base . Electronically Signed   By: Marcello Moores  Register   On: 01/27/2017 07:38   Dg Chest Port 1 View  Result Date: 01/26/2017 CLINICAL DATA:  Intubated patient, respiratory failure. EXAM: PORTABLE CHEST 1 VIEW COMPARISON:  Portable chest x-ray of January 24, 2017 FINDINGS: The lungs are reasonably well inflated. Patchy interstitial and alveolar opacities persist bilaterally. The retrocardiac region on the left remains dense but slight improved aeration is noted here today. The cardiac silhouette remains enlarged. The central pulmonary vascularity remains engorged. A small left pleural effusion is suspected. The endotracheal tube tip lies 2.5 cm above the carina. The feeding tube tip projects below the inferior margin of the image. The dual-lumen dialysis catheter tip projects over the distal third of the SVC. The left internal jugular venous  catheter tip projects over the proximal SVC. IMPRESSION: Slight interval improvement in the appearance of the lungs consistent with resolving edema or pneumonia or combination of the two. Persistent left lower lobe atelectasis or pneumonia with probable small left pleural effusion. Electronically Signed   By: David  Martinique M.D.   On: 01/26/2017 07:57   Dg Chest Port 1 View  Result Date: 01/24/2017 CLINICAL DATA:  Intubated patient, acute respiratory failure. EXAM: PORTABLE CHEST 1 VIEW COMPARISON:  Portable chest x-ray of January 23, 2017 FINDINGS: The cardiac silhouette remains enlarged. The retrocardiac region remains dense with obscuration of the left hemidiaphragm. The interstitial markings remain increased in the pulmonary vascularity engorged. There is calcification in the wall of the aortic arch. The endotracheal tube tip lies 2.7 cm above the carina. The esophagogastric tube tip projects below the inferior margin of the image. The dual-lumen dialysis type catheter tip projects over the midportion of the SVC. The left internal jugular venous catheter tip projects over the proximal SVC. IMPRESSION: CHF with pulmonary interstitial edema. Left lower lobe atelectasis or pneumonia. The support tubes are in reasonable position. There has not been significant interval change since yesterday's study. The support tubes are in reasonable position. Thoracic aortic atherosclerosis. Electronically Signed   By: David  Martinique M.D.   On: 01/24/2017 07:22   Dg Chest Port 1 View  Result Date: 01/23/2017 CLINICAL DATA:  Acute respiratory failure. EXAM: PORTABLE CHEST 1 VIEW COMPARISON:  One-view chest x-ray 01/22/2017 FINDINGS: Endotracheal tube is stable, 4.4 cm above the carina. NG tube courses off the inferior border the film. A left IJ line is stable. The right IJ dialysis catheter is stable. The heart is enlarged. Mild edema has slightly increased. Retrocardiac opacification remains. IMPRESSION: 1. Slight increase  and mild edema. 2. Stable retrocardiac opacification likely reflecting a combination of atelectasis and effusion. Infection  is not excluded. 3. The support apparatus is stable. Electronically Signed   By: San Morelle M.D.   On: 01/23/2017 07:58   Dg Chest Port 1 View  Result Date: 01/22/2017 CLINICAL DATA:  Ventilator dependent EXAM: PORTABLE CHEST 1 VIEW COMPARISON:  01/21/2017 FINDINGS: Endotracheal tube, central venous lines and NG tube are unchanged. Stable enlarged cardiac silhouette. Mild central venous congestion similar prior. LEFT basilar atelectasis is increased. IMPRESSION: 1. Increase in LEFT basilar atelectasis. 2. Central venous pulmonary congestion similar prior. 3. Stable support apparatus. Electronically Signed   By: Suzy Bouchard M.D.   On: 01/22/2017 07:40   Dg Chest Port 1 View  Result Date: 01/21/2017 CLINICAL DATA:  Central line placement EXAM: PORTABLE CHEST 1 VIEW COMPARISON:  Portable exam 1300 hours compared 01/21/2017 FINDINGS: Tip of endotracheal tube projects 4.1 cm above carina. Nasogastric tube extends into stomach. RIGHT jugular dual-lumen catheter projects over cavoatrial junction. New RIGHT jugular central venous catheter with tip projecting over SVC. EKG leads project over chest. Enlargement of cardiac silhouette with pulmonary vascular congestion. Atherosclerotic calcification aorta. Slightly improved interstitial edema. No pleural effusion or pneumothorax. IMPRESSION: No pneumothorax following LEFT jugular line placement. Slightly improved pulmonary edema. Electronically Signed   By: Lavonia Dana M.D.   On: 01/21/2017 13:16   Dg Chest Port 1 View  Result Date: 01/21/2017 CLINICAL DATA:  Shortness of Breath EXAM: PORTABLE CHEST 1 VIEW COMPARISON:  01/17/2017 FINDINGS: Cardiac shadow remains enlarged. Dialysis catheter is again seen. The left jugular central line is been removed. The lungs are well aerated bilaterally. No focal infiltrate or sizable effusion  is seen. Mild fullness of the pulmonary vasculature is noted likely related to a degree of volume overload. No bony abnormality is seen. IMPRESSION: Mild volume overload likely related to the underlying Clinical state. No new focal abnormality is seen. Electronically Signed   By: Inez Catalina M.D.   On: 01/21/2017 10:03   Dg Chest Port 1 View  Result Date: 01/17/2017 CLINICAL DATA:  Shortness of breath. EXAM: PORTABLE CHEST 1 VIEW COMPARISON:  Single-view of the chest 01/15/2017 and 01/10/2017. FINDINGS: There is marked cardiomegaly but the lungs are clear without edema. No pneumothorax or pleural effusion. Left IJ catheter and right IJ dialysis catheter are unchanged. Atherosclerosis is noted. IMPRESSION: Cardiomegaly without acute disease. Atherosclerosis. Electronically Signed   By: Inge Rise M.D.   On: 01/17/2017 10:22   Dg Chest Port 1 View  Result Date: 01/15/2017 CLINICAL DATA:  Central line placement EXAM: PORTABLE CHEST 1 VIEW COMPARISON:  Five days ago FINDINGS: Dialysis catheter on the right with tip at the SVC. Left IJ central line with tip at the upper SVC. Marked cardiomegaly. Interstitial opacity seen previously is essentially resolved. No asymmetric airspace disease, effusion, or pneumothorax. IMPRESSION: 1. Stable positioning of central lines, tips overlapping the SVC. 2. Pulmonary edema seen 5 days ago is improved/resolved. Electronically Signed   By: Monte Fantasia M.D.   On: 01/15/2017 07:31   Dg Chest Port 1 View  Result Date: 01/10/2017 CLINICAL DATA:  Central line placement. EXAM: PORTABLE CHEST 1 VIEW COMPARISON:  01/09/2017 FINDINGS: The right IJ catheter is stable. There is also a small caliber right-sided PICC line which is stable. New left IJ center venous catheter tip is in the mid SVC with the tip against the lateral wall. No complicating features. Stable cardiac enlargement and diffuse pulmonary edema. No definite pleural effusions. IMPRESSION: Support apparatus in  good position without complicating features. Persistent cardiac enlargement and  pulmonary edema. Electronically Signed   By: Marijo Sanes M.D.   On: 01/10/2017 13:09   Dg Chest Port 1 View  Result Date: 01/09/2017 CLINICAL DATA:  Fever. Hx stroke, HTN, PNA, CKD, former smoker. EXAM: PORTABLE CHEST 1 VIEW COMPARISON:  01/04/2017 FINDINGS: Since prior exam, the left internal jugular central venous line has been removed. A new tunneled right internal jugular central venous line has been placed. The tip projects the right atrium. The right subclavian central venous line is stable. There is persistent cardiomegaly, central vascular congestion and mild interstitial thickening and hazy central airspace opacity consistent with pulmonary edema. No pneumothorax. IMPRESSION: 1. Cardiomegaly with mild persistent pulmonary edema similar to the previous exam. 2. New right internal jugular tunneled central venous line catheter tip projects in the right atrium. No pneumothorax. Electronically Signed   By: Lajean Manes M.D.   On: 01/09/2017 07:36   Dg Abd Portable 1v  Result Date: 01/26/2017 CLINICAL DATA:  Feeding tube placement. EXAM: PORTABLE ABDOMEN - 1 VIEW COMPARISON:  01/25/2017 FINDINGS: Feeding tube is unchanged in position with tip again at the expected level of the ligament of Treitz. No dilated loops of bowel are seen in the included left upper abdomen. Bibasilar lung base opacities are partially visualized, more fully evaluated on chest radiograph earlier today. No acute osseous abnormality is seen. IMPRESSION: Unchanged feeding tube with tip of the level of the ligament of Treitz. Electronically Signed   By: Logan Bores M.D.   On: 01/26/2017 10:22   Dg Abd Portable 1v  Result Date: 01/25/2017 CLINICAL DATA:  61 year old female feeding tube placement. Initial encounter. EXAM: PORTABLE ABDOMEN - 1 VIEW COMPARISON:  01/22/2017 and earlier. FINDINGS: Portable AP supine view at 1050 hours. Enteric tube courses  through the stomach and into the duodenum with tip at the level of the ligament of Treitz. Negative visible bowel gas pattern. Partially visible increased left lung base opacity. No acute osseous abnormality identified. IMPRESSION: 1. Enteric tube placed, tip at the ligament of Treitz level. 2. Negative visible bowel gas pattern. Electronically Signed   By: Genevie Ann M.D.   On: 01/25/2017 11:11   Dg Abd Portable 1v  Result Date: 01/22/2017 CLINICAL DATA:  Orogastric tube placement.  Initial encounter. EXAM: PORTABLE ABDOMEN - 1 VIEW COMPARISON:  CT of the abdomen and pelvis performed 03/08/2016 FINDINGS: The patient's enteric tube is noted ending overlying the body of the stomach. The visualized bowel gas pattern is unremarkable. Scattered air and stool filled loops of colon are seen; no abnormal dilatation of small bowel loops is seen to suggest small bowel obstruction. No free intra-abdominal air is identified, though evaluation for free air is limited on a single supine view. The visualized osseous structures are within normal limits; the sacroiliac joints are unremarkable in appearance. IMPRESSION: Enteric tube noted ending overlying the body of the stomach. Electronically Signed   By: Garald Balding M.D.   On: 01/22/2017 00:20   Dg Swallowing Func-speech Pathology  Result Date: 01/31/2017 Objective Swallowing Evaluation: Type of Study: MBS-Modified Barium Swallow Study Patient Details Name: Paige Marshall MRN: [id number] Date of Birth: 1956/09/04 Today's Date: 01/31/2017 Time: SLP Start Time (ACUTE ONLY): 1350-SLP Stop Time (ACUTE ONLY): 1450 SLP Time Calculation (min) (ACUTE ONLY): 60 min Past Medical History: Past Medical History: Diagnosis Date . Asthma  . Chronic combined systolic and diastolic CHF (congestive heart failure) ([place])  . CKD (chronic kidney disease), stage III  . COPD (chronic obstructive pulmonary disease) ([place])  .  Degenerative joint disease of both lower legs  . Former smoker  . Gout    hx in "both knees" (01/14/2015) . H. pylori infection 01/03/2014  +breath test.   . High cholesterol  . Hypertension  . Morbid obesity ([place])  . NICM (nonischemic cardiomyopathy) ([place])   a. LHC 01/2015: normal cors, EF 45-50%. b. EF 50-55% in 08/2016. c. EF 33% 01/2017 . OSA (obstructive sleep apnea)  . Persistent atrial fibrillation ([place])  . Pneumonia X 2 . Pulmonary hypertension ([place])  . Rheumatoid arthritis ([place])  . Stroke [facility])  Past Surgical History: Past Surgical History: Procedure Laterality Date . ANGIOGRAM/LV (CONGENITAL)  2007 . BREATH TEK H PYLORI N/A 12/31/2013  Procedure: BREATH TEK H PYLORI;  Surgeon: Gayland Curry, MD;  Location: [facility] ENDOSCOPY;  Service: General;  Laterality: N/A; . CARDIOVERSION N/A 01/13/2017  Procedure: CARDIOVERSION;  Surgeon: Jolaine Artist, MD;  Location: [facility] ENDOSCOPY;  Service: Cardiovascular;  Laterality: N/A; . CARDIOVERSION N/A 01/20/2017  Procedure: CARDIOVERSION;  Surgeon: Larey Dresser, MD;  Location: [place];  Service: Cardiovascular;  Laterality: N/A; . CORNEAL TRANSPLANT Right ~ 2010 . CORONARY ANGIOPLASTY WITH STENT PLACEMENT  11/04/2005  "1" . DOPPLER ECHOCARDIOGRAPHY    2 D   EF of 45% . EYE SURGERY   . IR GENERIC HISTORICAL  01/06/2017  IR FLUORO GUIDE CV LINE RIGHT 01/06/2017 Corrie Mckusick, DO [facility]INTER[facility] . IR GENERIC HISTORICAL  01/06/2017  IR US GUIDE VASC ACCESS RIGHT 01/06/2017 Corrie Mckusick, DO [facility] . LEFT HEART CATHETERIZATION WITH CORONARY ANGIOGRAM N/A 01/20/2015  Procedure: LEFT HEART CATHETERIZATION WITH CORONARY ANGIOGRAM;  Surgeon: Lorretta Harp, MD;  Location: [facility] CATH LAB;  Service: Cardiovascular;  Laterality: N/A; . RIGHT HEART CATH N/A 01/07/2017  Procedure: Right Heart Cath;  Surgeon: Jolaine Artist, MD;  Location: [facility] CV LAB;  Service: Cardiovascular;  Laterality: N/A; . sleep study  2011 . stress myocardial dipyridamole perfusion   . TEE WITHOUT CARDIOVERSION N/A 01/13/2017  Procedure: TRANSESOPHAGEAL ECHOCARDIOGRAM (TEE);   Surgeon: Jolaine Artist, MD;  Location: [facility];  Service: Cardiovascular;  Laterality: N/A; . [place] . venous duplex ultrasound  [address] HPI: 61 year old woman with poorly controlled OHS, chronic combined systolic and diastolic CHF, a-fib/a-flutter on chronic anticoagulation, hypertension, COPD, CKD Stage 3. Admitted to ED from [place] on 3/18 foracute decompensated CHF complicated by cardiorenal syndrome. During stay developed acute on chronic kidney disease requiring CRRT, now being followed by nephrology for PRN HD. Blood culture positive for VRE bacteremia being treated with ampicillin and ceftriaxone. Transferred to step-down on 4/11. On 4/12 noted to be more lethargic and aphasic. CT Head revealed mixed density mass like area along the surface of the anterior frontal lobe with cytotoxic edema as well as a rounded 2 cm hemorrhage.ETT 4/13-4/18. Initial FEES 01/27/17 with recs for NPO.   Subjective: pleasant, communicative Assessment / Plan / Recommendation CHL IP CLINICAL IMPRESSIONS 01/31/2017 Clinical Impression Pt presents with much improved swallow function with adequate mastication, delay to the pyriforms for thin liquids, but no residue post-swallow and no penetration nor aspiration.  Pt taxed with successive, large thin liquid boluses, but maintained adequate airway protection.  Recommend resuming a regular consistency diet with thin liquids; give meds whole in puree.  NG may be discontinued.   SLP Visit Diagnosis Dysphagia, oropharyngeal phase (R13.12) Attention and concentration deficit following -- Frontal lobe and executive function deficit following -- Impact on safety and function No limitations   CHL IP TREATMENT  RECOMMENDATION 01/27/2017 Treatment Recommendations Therapy as outlined in treatment plan below   Prognosis 01/27/2017 Prognosis for Safe Diet Advancement Good Barriers to Reach Goals Cognitive deficits Barriers/Prognosis Comment -- CHL IP DIET RECOMMENDATION 01/31/2017  SLP Diet Recommendations Regular solids;Thin liquid Liquid Administration via Cup;Straw Medication Administration Whole meds with puree Compensations -- Postural Changes Seated upright at 90 degrees   CHL IP OTHER RECOMMENDATIONS 01/31/2017 Recommended Consults -- Oral Care Recommendations Oral care BID Other Recommendations --   CHL IP FOLLOW UP RECOMMENDATIONS 01/28/2017 Follow up Recommendations Inpatient Rehab   CHL IP FREQUENCY AND DURATION 01/31/2017 Speech Therapy Frequency (ACUTE ONLY) min 1 x/week Treatment Duration 1 week      CHL IP ORAL PHASE 01/31/2017 Oral Phase WFL Oral - Pudding Teaspoon -- Oral - Pudding Cup -- Oral - Honey Teaspoon -- Oral - Honey Cup -- Oral - Nectar Teaspoon -- Oral - Nectar Cup -- Oral - Nectar Straw -- Oral - Thin Teaspoon -- Oral - Thin Cup -- Oral - Thin Straw -- Oral - Puree -- Oral - Mech Soft -- Oral - Regular -- Oral - Multi-Consistency -- Oral - Pill -- Oral Phase - Comment --  CHL IP PHARYNGEAL PHASE 01/31/2017 Pharyngeal Phase WFL Pharyngeal- Pudding Teaspoon -- Pharyngeal -- Pharyngeal- Pudding Cup -- Pharyngeal -- Pharyngeal- Honey Teaspoon NT Pharyngeal -- Pharyngeal- Honey Cup -- Pharyngeal -- Pharyngeal- Nectar Teaspoon WFL Pharyngeal -- Pharyngeal- Nectar Cup -- Pharyngeal -- Pharyngeal- Nectar Straw -- Pharyngeal -- Pharyngeal- Thin Teaspoon WFL Pharyngeal -- Pharyngeal- Thin Cup WFL Pharyngeal -- Pharyngeal- Thin Straw -- Pharyngeal -- Pharyngeal- Puree WFL Pharyngeal -- Pharyngeal- Mechanical Soft -- Pharyngeal -- Pharyngeal- Regular -- Pharyngeal -- Pharyngeal- Multi-consistency -- Pharyngeal -- Pharyngeal- Pill -- Pharyngeal -- Pharyngeal Comment --  No flowsheet data found. No flowsheet data found. [name] 01/31/2017, 3:03 PM              Mr Mrv Head Wo Cm  Result Date: 01/22/2017 CLINICAL DATA:  Extra-axial hemorrhage following recent cardioversion. EXAM: MR VENOGRAM the HEAD WITHOUT CONTRAST TECHNIQUE: Angiographic images of the intracranial  venous structures were obtained using MRV technique without intravenous contrast. COMPARISON:  MRI brain 01/21/2017.  MRA head from the same date. FINDINGS: The superior sagittal sinus is patent. The straight sinus and deep cerebral veins are patent. The transverse and sigmoid sinuses are codominant and patent bilaterally. The cortical veins are unremarkable. The source images demonstrates a stable appearance of the extra-axial hemorrhages. IMPRESSION: Negative MR venogram. Electronically Signed   By: San Morelle M.D.   On: 01/22/2017 08:40    Time Spent in minutes  30   Jani Gravel M.D on 02/04/2017 at 6:38 AM  Between 7am to 7pm - Pager - [phone]  After 7pm go to [email or web address] - password [facility]  [facility] -  Office  [phone]

## 2017-02-04 NOTE — Progress Notes (Signed)
[facility] Progress Note    Assessment/ Plan:   1. Anuric AKI: due to persistent shock. Still no UOP. Continues to do well with HD. - MWF HD- plan for HD session today - Start on Fosrenol tid with meals - Vein mapping ordered - VVS consulted for access  2. Paroxysmal Afib / acute on chronic combined systolic and diastolic HF - PO Amiodarone - No anticoagulation for recent SDH  3. Persistent shock s/p VRE bacteremia- resolved. BPs much better. - On Midodrine 15mg  tid - Weaning stress dose steroids - Completed abx 01/26/17, repeat blood cultures 4/19 NGTD  4.  SDH: stable - Not a candidate for anticoagulation  5.  Anemia: s/p 2 units pRBCs 4/22. Hgb stable. - Aranesp q Monday  6.  Thrombocytopenia- resolved - Hematology signing off   Subjective:    Doing well this morning. Would like to get out of bed to the chair. No concerns. No shortness of breath.   Objective:   BP 110/61 (BP Location: Left Leg)   Pulse 91   Temp 97.8 F (36.6 C) (Oral)   Resp (!) 21   Ht 5\' 8"  (1.727 m)   Wt 275 lb 9.2 oz (125 kg)   LMP 09/07/2011   SpO2 97%   BMI 41.90 kg/m   Intake/Output Summary (Last 24 hours) at 02/04/17 0854 Last data filed at 02/03/17 1825  Gross per 24 hour  Intake              680 ml  Output                0 ml  Net              680 ml   Weight change:   Physical Exam: Gen: Laying in bed, pleasant, in NAD CVS: Irregularly irregular rhythm, mildly tachycardic Resp: CTAB, normal work of breathing Abd: obese, soft, non-tender Ext: 1-2+ edema in the lower extremities bilaterally ACCESS: R TDC in place  Imaging: No results found.  Labs: BMET  Recent Labs Lab 01/30/17 1557 01/31/17 0506 02/01/17 0416 02/01/17 0417 02/02/17 0426 02/03/17 0402 02/04/17 0550  NA 139 138 139 140 136 138  137 138  K 5.6* 5.7* 4.3 4.4 4.8 4.3  3.9 4.2  CL 102 100* 98* 98* 96* 101  99* 99*  CO2 23 23 25 25 23 25  26 24   GLUCOSE 190* 174* 107* 107*  103* 109*  108* 90  BUN 78* 94* 67* 70* 92* 43*  42* 61*  CREATININE 2.92* 3.35* 2.82* 2.83* 3.98* 2.78*  2.83* 4.17*  CALCIUM 8.3* 8.4* 8.3* 8.2* 8.7* 8.3*  8.5* 8.7*  PHOS 3.4 4.6 4.6 4.6 7.2* 5.8*  5.9* 7.3*   CBC  Recent Labs Lab 02/01/17 0416 02/02/17 0453 02/03/17 0402 02/04/17 0550  WBC 18.7* 17.6* 13.8* 13.8*  NEUTROABS 15.9* 15.3* 12.0* 11.5*  HGB 7.7* 7.8* 7.9* 8.0*  HCT 25.8* 26.0* 27.5* 27.3*  MCV 88.7 89.7 92.3 92.9  PLT 198 169 177 170    Medications:    . amiodarone  200 mg Oral BID  . darbepoetin (ARANESP) injection - NON-DIALYSIS  150 mcg Subcutaneous Q Mon-1800  . [START ON 02/06/2017] hydrocortisone sod succinate (SOLU-CORTEF) inj  12.5 mg Intravenous Q12H  . hydrocortisone sod succinate (SOLU-CORTEF) inj  25 mg Intravenous Q6H  . insulin aspart  2-6 Units Subcutaneous Q4H  . mouth rinse  15 mL Mouth Rinse BID  . midodrine  15 mg Oral TID WC  .  pantoprazole  40 mg Oral Daily      Hyman Bible, MD PGY-2 Family Medicine Resident 02/04/2017, 8:54 AM   I have seen and examined this patient and agree with plan and assessment in the above note with renal recommendations/intervention highlighted.  She is due for HD today.  Will also order vein mapping and VVS consult for AVF/AVG placement as she had CKD stage 4 prior to this episode of AKI. Broadus John A Xavious Sharrar,MD 02/04/2017 12:30 PM

## 2017-02-04 NOTE — Progress Notes (Signed)
RT NOTE:  Pt has no desire to wear CPAP @ bedtime. We do not stock the nasal pillows that she used @ home.  She stated that she has not used her machine at home for a while b/c it is broken. Pt tolerated 2L [place] well. RT will monitor. Pt understands to call RT if she changes her mind.

## 2017-02-05 ENCOUNTER — Inpatient Hospital Stay (HOSPITAL_COMMUNITY): Payer: Medicare Other

## 2017-02-05 DIAGNOSIS — R109 Unspecified abdominal pain: Secondary | ICD-10-CM

## 2017-02-05 DIAGNOSIS — R1030 Lower abdominal pain, unspecified: Secondary | ICD-10-CM

## 2017-02-05 LAB — CBC WITH DIFFERENTIAL/PLATELET
BASOS ABS: 0.3 10*3/uL — AB (ref 0.0–0.1)
BASOS PCT: 2 %
EOS PCT: 2 %
Eosinophils Absolute: 0.3 10*3/uL (ref 0.0–0.7)
HEMATOCRIT: 28.7 % — AB (ref 36.0–46.0)
HEMOGLOBIN: 8.3 g/dL — AB (ref 12.0–15.0)
LYMPHS ABS: 1.2 10*3/uL (ref 0.7–4.0)
Lymphocytes Relative: 9 %
MCH: 27 pg (ref 26.0–34.0)
MCHC: 28.9 g/dL — ABNORMAL LOW (ref 30.0–36.0)
MCV: 93.5 fL (ref 78.0–100.0)
MONOS PCT: 4 %
Monocytes Absolute: 0.6 10*3/uL (ref 0.1–1.0)
NEUTROS PCT: 83 %
Neutro Abs: 11.4 10*3/uL — ABNORMAL HIGH (ref 1.7–7.7)
Platelets: 174 10*3/uL (ref 150–400)
RBC: 3.07 MIL/uL — AB (ref 3.87–5.11)
RDW: 37.4 % — AB (ref 11.5–15.5)
WBC: 13.8 10*3/uL — ABNORMAL HIGH (ref 4.0–10.5)

## 2017-02-05 LAB — RENAL FUNCTION PANEL
Albumin: 2.7 g/dL — ABNORMAL LOW (ref 3.5–5.0)
Anion gap: 11 (ref 5–15)
BUN: 26 mg/dL — ABNORMAL HIGH (ref 6–20)
CHLORIDE: 101 mmol/L (ref 101–111)
CO2: 26 mmol/L (ref 22–32)
CREATININE: 2.54 mg/dL — AB (ref 0.44–1.00)
Calcium: 8.2 mg/dL — ABNORMAL LOW (ref 8.9–10.3)
GFR calc non Af Amer: 20 mL/min — ABNORMAL LOW (ref >60)
GFR, EST AFRICAN AMERICAN: 23 mL/min — AB (ref >60)
Glucose, Bld: 81 mg/dL (ref 65–99)
Phosphorus: 4 mg/dL (ref 2.5–4.6)
Potassium: 3.5 mmol/L (ref 3.5–5.1)
Sodium: 138 mmol/L (ref 135–145)

## 2017-02-05 LAB — HEPATIC FUNCTION PANEL
ALBUMIN: 2.7 g/dL — AB (ref 3.5–5.0)
ALT: 52 U/L (ref 14–54)
AST: 34 U/L (ref 15–41)
Alkaline Phosphatase: 92 U/L (ref 38–126)
BILIRUBIN TOTAL: 3 mg/dL — AB (ref 0.3–1.2)
Bilirubin, Direct: 1.4 mg/dL — ABNORMAL HIGH (ref 0.1–0.5)
Indirect Bilirubin: 1.6 mg/dL — ABNORMAL HIGH (ref 0.3–0.9)
Total Protein: 5.1 g/dL — ABNORMAL LOW (ref 6.5–8.1)

## 2017-02-05 LAB — GLUCOSE, CAPILLARY
GLUCOSE-CAPILLARY: 73 mg/dL (ref 65–99)
GLUCOSE-CAPILLARY: 104 mg/dL — AB (ref 65–99)
Glucose-Capillary: 82 mg/dL (ref 65–99)
Glucose-Capillary: 78 mg/dL (ref 65–99)
Glucose-Capillary: 102 mg/dL — ABNORMAL HIGH (ref 65–99)

## 2017-02-05 MED ORDER — PRO-STAT SUGAR FREE PO LIQD
30.0000 mL | Freq: Two times a day (BID) | ORAL | Status: DC
  Administered 2017-02-05 – 2017-02-07 (×4): 30 mL via ORAL
  Filled 2017-02-05 (×4): qty 30

## 2017-02-05 NOTE — Progress Notes (Signed)
[name], female   DOB: May 23, 1956, 61 y.o.   MRN: [id number]                                                                PROGRESS NOTE                                                                                                                                                                                                             Patient Demographics:    Paige Marshall, is a 61 y.o. female, DOB - [birth year], CVE:[id number]  Admit date - 12/26/2016   Admitting Physician Lucious Groves, DO  Outpatient Primary MD for the patient is EDWARDS, Milford Cage, NP  LOS - [phone]  [facility]  Chief Complaint  Patient presents with  . 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  Patient has had a long history of poorly controlled heart failure, treated by multiple episodes of hypervolemia. She has a baseline poor renal function which is completed her therapy. For the last several months patient has been living in a skilled nursing facility after she suffered a broken fibula last year,and she has had multiple admissions from this facility to the hospital for acute on chronic heart failure exacerbations. Was recently she's been followed by Dr. Alvester Chou her cardiologist with concern of volume overload. Her diuretic regimen was increased to 80 mg IV twice a day with a creatinine ~2.0at that point. 1 week later when she had follow-up,she was noted to have worsening kidney function which was thought to be due to her increased diuretic regimen. She was again transitioned to by mouth Lasix 80 mg twice a day on 12/20/2016. Patient reports that since that time she feels like she has been slowly gaining fluid. Over the last 2 days patient notes a 15 pound weight gain as recorded by daily weights. Last  night patient reports that she had acute onset of shortness of breath. Today she is in mild respiratory distress sitting in her bed with nasal cannula oxygen.  Patient denies any chest pain, palpitations. She reports that when she misses her antiarrhythmic medications she  does experience palpitations but has not noted this recently. She denies any significant changes to urination. She reports that she tends to urinate more frequently when she is on higher doses of the Lasix but has not had any significant oliguriarecently. Patient does also report some diffuse abdominal tenderness which she describes as tightness, "a band around her stomach."  In the ED pt was acutely SOB with tachypnea to 30 and tachycardia in the 110s. She was found to have hyperkalemia to 6.3 and AKI w/ SCr 3.03. She was temporized with IV Lasix and Insulin + Dextrose. BP remained stable.Her BNP was significantly elevated at 1100up from her baseline of 200-400.   Pt was admitted for Acute CHF, pt was started on iv lasix, and metolazone and milrinone.  Due to Acute on CRF, pt was tx with kaxelate. And due to Afib with RVR, =>  Important events: Renal ultrasound 3/18=> no hydronephrosis, moderate lower abdominal ascites Milrinone 0.35mg 3/19=> Amiodarone gtt 3/21 HD catheter 3/21 CRRT 3/22 Cardizem gtt 3/22 2units prbc 3/24 Cardioversion x2 3/27  => converstion to MAT no complications  Tunnelled Dialysis catheter placement 3/28  IV abradine x3 3/29 Electophysiology consult 3/29 (Carleene OverlieTaylor)=>  R heart cath 3/30 (Linna HoffBensimhon)=>  Moderate to severe PAH w RV failure despite milrinone therapy, normal left sided pressure after100+ lbs of volume removal with CVVHD, low PA sat suspect CO is significantly overestimated Transfer to SDU 3/30  Rapid Response 4/1=> fever, hypotension, Afib with rvr Transfer to ICU Sepsis 4/1= Vanco /Cefepime initiated, Neo gtt 4/1=> R femoral art line 4/1 Amiodarone 1516miv x1 and  gtt  4/1, cardizem 3067m6h 4/1 Blood culture + VRE  (4/1), ID consulted => start Daptomycin 4/1 PM, discontinue Vanco/cefepime L IJ 4/2 Blood culture 4/1  => E. Faecalis R to vancomycin, S to ampicilin, gentamicin, linezolid Daptomycin discontinued 4/3  => ampicillin started Cardiac echo 4/3  => EF 35-40%, mild to mod AR, mild-mod MR, mod-sev TR TEE w direct cardioversion 4/5  (EF 25-30%, global hk,  Mild-mod MR, severe TR, no vegetation seen, no PFO /ASD [facility] Neo stopped 4/9 Amio gtt stopped 4/10=> Amio po   Transfer to SDU 4/12  Cardioversion x3 4/12=> NSR PACU => Hypotension 4/12  => Levophed initated Transfer to ICU Acute encephalopathy 4/12 CT brain 4/12=>  intracranial hemorrhage, Heparin/coumadin stopped 4/12, Vitamin K/Kcentra ETT 4/13 R femoral arterial line 4/13 EEG 01/19/31=>tabolic encephalopathy, no seizure MRI brain /MRA 4/14=> L subdural hematoma, trace right frontal subdural hematoma, no [id number] posterior communicating artery aneurysm Thrombocytopenia 4/16  Plt=39 secondary to DIC Oncology consulted 4/16 Afib 4/16 Amiodarone gtt inititated Relative adrenal insufficiency, solucortef initiated on 4/19 Extubated 4/19 Hypotension 4/21, neo reinitiated Transfuse Prbc 4/22 Amiodarone gtt=> amiodarone po 4/24 Neo stopped 4/25  ESRD on HD, vascular surgery consulted 4/27 for AVF vs AVG     Subjective:    Paige Marshall has less abdominal pain, denies fever, chills, diarrhea, brbpr,  No headache, No chest pain,  No new weakness tingling or numbness, No Cough - SOB.    Assessment  & Plan :    Principal Problem:   Cardiogenic shock (HCC)[place] Problems:   Essential hypertension   Obstructive sleep apnea   Morbid obesity (HCC)   Obesity hypoventilation syndrome (HCC)   COPD mixed type (HCC)   Chronic respiratory failure with hypoxia (HCC)   Acute kidney injury superimposed on CKD (HCC)[place] chronic  combined systolic and diastolic CHF (congestive heart failure) (HCC)   Thoracic aortic atherosclerosis (HCC)   Cardiorenal syndrome   VRE bacteremia   PAH (pulmonary artery hypertension) (HCC)   Chronic atrial fibrillation (HCC)   Left-sided nontraumatic intracerebral hemorrhage ([place])   Encounter for central line placement   SDH (subdural hematoma) (HCC)   Hygroma   Thrombocytopenia (HCC)   DIC (disseminated intravascular coagulation) (HCC)   HIT (heparin-induced thrombocytopenia) (HCC)  Abdominal pain Check ct scan abd/ pelvis  Acute on chronic combined CHF. Cardiogenic shock - off pressors, on midodrine. Continue solucortef for relative adrenal insufficiency  A fib with RVR. cont amiodarone po, no anticoagulation due to [place] cardiology input  Acute renal failure Tolerated intermittent HD 4/23 and 4/25, will receive HD 4/27 Appreciate nephrology input Hopefully will continue to maintain her BP off pressors and tolerate iHD.   Acute on chronic respiratory failure secondary  toHx of COPD, OSA, OHS + CHF -stable  Bipap qHS Cont o2 [place]  Hypoxemia titrate o2 sat 89-92%  Dysphagia tolerating oral diet.   Acute on Chronic Anemia - stable after few units of transfusion intermittently Thrombocytopenia -resolved likely DIC - improving  Appreciate oncology input  Cbc in am  Relative adrenal insufficiency. Stable  Acute metabolic encephalopathy -resolved, was likely from uremia and hypotension and Recent SDH  VRE bacteremia, TEE negative for endocarditis Off Abx (completed 19 days)  Lab Results  Component Value Date   PLT 174 02/05/2017    Antibiotics  :    Anti-infectives    Start     Dose/Rate Route Frequency Ordered Stop   01/22/17 1800  ampicillin (OMNIPEN) 2 g in sodium chloride 0.9 % 50 mL IVPB     2 g 150 mL/hr over 20 Minutes Intravenous Every 8 hours 01/22/17 1053 01/26/17 1832   01/17/17 2012  ampicillin (OMNIPEN) 2 g in sodium  chloride 0.9 % 50 mL IVPB  Status:  Discontinued     2 g 150 mL/hr over 20 Minutes Intravenous Every 12 hours 01/17/17 1253 01/22/17 1053   01/13/17 1100  cefTRIAXone (ROCEPHIN) 2 g in dextrose 5 % 50 mL IVPB     2 g 100 mL/hr over 30 Minutes Intravenous Every 12 hours 01/13/17 1056 01/26/17 2247   01/11/17 2000  DAPTOmycin (CUBICIN) 1,000 mg in sodium chloride 0.9 % IVPB  Status:  Discontinued     1,000 mg 240 mL/hr over 30 Minutes Intravenous  Once 01/10/17 1249 01/11/17 1000   01/11/17 1600  ampicillin (OMNIPEN) 2 g in sodium chloride 0.9 % 50 mL IVPB  Status:  Discontinued     2 g 150 mL/hr over 20 Minutes Intravenous Every 8 hours 01/11/17 1000 01/17/17 1253   01/09/17 2200  ceFEPIme (MAXIPIME) 1 g in dextrose 5 % 50 mL IVPB  Status:  Discontinued     1 g 100 mL/hr over 30 Minutes Intravenous Every 24 hours 01/09/17 0033 01/09/17 1118   01/09/17 2200  ceFEPIme (MAXIPIME) 2 g in dextrose 5 % 50 mL IVPB  Status:  Discontinued     2 g 100 mL/hr over 30 Minutes Intravenous Every 24 hours 01/09/17 1118 01/09/17 1130   01/09/17 2200  ceFEPIme (MAXIPIME) 2 g in dextrose 5 % 50 mL IVPB  Status:  Discontinued     2 g 100 mL/hr over 30 Minutes Intravenous Every 12 hours 01/09/17 1130 01/09/17 1914   01/09/17 1900  DAPTOmycin (CUBICIN) 1,000 mg in sodium chloride 0.9 % IVPB  1,000 mg 240 mL/hr over 30 Minutes Intravenous  Once 01/09/17 1852 01/09/17 2013   01/09/17 0045  vancomycin (VANCOCIN) 2,000 mg in sodium chloride 0.9 % 500 mL IVPB     2,000 mg 250 mL/hr over 120 Minutes Intravenous  Once 01/09/17 0033 01/09/17 0318   01/09/17 0045  ceFEPIme (MAXIPIME) 2 g in dextrose 5 % 50 mL IVPB     2 g 100 mL/hr over 30 Minutes Intravenous  Once 01/09/17 0033 01/09/17 0149   01/06/17 0545  vancomycin (VANCOCIN) 1,500 mg in sodium chloride 0.9 % 500 mL IVPB     1,500 mg 250 mL/hr over 120 Minutes Intravenous To Radiology 01/06/17 0533 01/06/17 1805        Objective:   Vitals:   02/05/17  0247 02/05/17 0400 02/05/17 0649 02/05/17 0818  BP: 130/76 139/64  132/64  Pulse: (!) 102 (!) 111  (!) 106  Resp: _0 Temp: 97.8 F (36.6 C) 98.1 F (36.7 C)  98 F (36.7 C)  TempSrc: Oral Oral  Oral  SpO2: 100% 96%  95%  Weight:   112.9 kg (249 lb)   Height:        Wt Readings from Last 3 Encounters:  02/05/17 112.9 kg (249 lb)  12/22/16 (!) 177.8 kg (392 lb)  12/15/16 (!) 182.8 kg (403 lb)     Intake/Output Summary (Last 24 hours) at 02/05/17 0937 Last data filed at 02/05/17 0643  Gross per 24 hour  Intake              720 ml  Output             1500 ml  Net             -780 ml     Physical Exam  Awake Alert, Oriented X 3, No new F.N deficits, Normal affect [place].AT,PERRAL Supple Neck,No JVD, No cervical lymphadenopathy appriciated.  Symmetrical Chest wall movement, Good air movement bilaterally, CTAB irr irr s1, s2  , +ve B.Sounds, Abd Soft, No tenderness, No organomegaly appriciated, No rebound - guarding or rigidity. No Cyanosis, Clubbing or edema, No new Rash or bruise    Data Review:    CBC  Recent Labs Lab 02/01/17 0416 02/02/17 0453 02/03/17 0402 02/04/17 0550 02/05/17 0515  WBC 18.7* 17.6* 13.8* 13.8* 13.8*  HGB 7.7* 7.8* 7.9* 8.0* 8.3*  HCT 25.8* 26.0* 27.5* 27.3* 28.7*  PLT 198 169 177 170 174  MCV 88.7 89.7 92.3 92.9 93.5  MCH 26.5 26.9 26.5 27.2 27.0  MCHC 29.8* 30.0 28.7* 29.3* 28.9*  RDW 33.8* 35.8* 37.8* 37.9* 37.4*  LYMPHSABS 1.1 1.2 0.8 1.2 1.2  MONOABS 1.5* 0.7 0.8 0.7 0.6  EOSABS 0.0 0.2 0.1 0.3 0.3  BASOSABS 0.2* 0.2* 0.1 0.1 0.3*    Chemistries   Recent Labs Lab 02/01/17 0416 02/01/17 0417 02/02/17 0426 02/03/17 0402 02/04/17 0550 02/05/17 0515  NA 139 140 136 138  137 138 138  K 4.3 4.4 4.8 4.3  3.9 4.2 3.5  CL 98* 98* 96* 101  99* 99* 101  CO2 _1 GLUCOSE 107* 107* 103* 109*  108* 90 81  BUN 67* 70* 92* 43*  42* 61* 26*  CREATININE 2.82* 2.83* 3.98* 2.78*  2.83* 4.17* 2.54*    CALCIUM 8.3* 8.2* 8.7* 8.3*  8.5* 8.7* 8.2*  MG 2.3  --   --  2.1  --   --   AST  --   --   --   --   --  34  ALT  --   --   --   --   --  52  ALKPHOS  --   --   --   --   --  92  BILITOT  --   --   --   --   --  3.0*   ------------------------------------------------------------------------------------------------------------------ No results for input(s): CHOL, HDL, LDLCALC, TRIG, CHOLHDL, LDLDIRECT in the last 72 hours.  Lab Results  Component Value Date   HGBA1C  10/02/2009    5.6 (NOTE) The ADA recommends the following therapeutic goal for glycemic control related to Hgb A1c measurement: Goal of therapy: <6.5 Hgb A1c  Reference: American Diabetes Association: Clinical Practice Recommendations 2010, Diabetes Care, 2010, 33: (Suppl  1).   ------------------------------------------------------------------------------------------------------------------ No results for input(s): TSH, T4TOTAL, T3FREE, THYROIDAB in the last 72 hours.  Invalid input(s): FREET3 ------------------------------------------------------------------------------------------------------------------ No results for input(s): VITAMINB12, FOLATE, FERRITIN, TIBC, IRON, RETICCTPCT in the last 72 hours.  Coagulation profile No results for input(s): INR, PROTIME in the last 168 hours.  No results for input(s): DDIMER in the last 72 hours.  Cardiac Enzymes No results for input(s): CKMB, TROPONINI, MYOGLOBIN in the last 168 hours.  Invalid input(s): CK ------------------------------------------------------------------------------------------------------------------    Component Value Date/Time   BNP 1,116.0 (H) 12/26/2016 1643    Inpatient Medications  Scheduled Meds: . amiodarone  200 mg Oral BID  . darbepoetin (ARANESP) injection - NON-DIALYSIS  150 mcg Subcutaneous Q Mon-1800  . [START ON 02/06/2017] hydrocortisone sod succinate (SOLU-CORTEF) inj  12.5 mg Intravenous Q12H  . hydrocortisone sod succinate  (SOLU-CORTEF) inj  25 mg Intravenous Q6H  . insulin aspart  2-6 Units Subcutaneous Q4H  . iopamidol      . lanthanum  1,000 mg Oral TID WC  . mouth rinse  15 mL Mouth Rinse BID  . midodrine  15 mg Oral TID WC  . pantoprazole  40 mg Oral Daily   Continuous Infusions: PRN Meds:.acetaminophen (TYLENOL) oral liquid 160 mg/5 mL, diphenhydrAMINE-zinc acetate, Gerhardt's butt cream, heparin, ondansetron (ZOFRAN) IV, senna-docusate, simethicone  Micro Results Recent Results (from the past 240 hour(s))  Gastrointestinal Panel by PCR , Stool     Status: None   Collection Time: 01/27/17 10:13 AM  Result Value Ref Range Status   Campylobacter species NOT DETECTED NOT DETECTED Final   Plesimonas shigelloides NOT DETECTED NOT DETECTED Final   Salmonella species NOT DETECTED NOT DETECTED Final   Yersinia enterocolitica NOT DETECTED NOT DETECTED Final   Vibrio species NOT DETECTED NOT DETECTED Final   Vibrio cholerae NOT DETECTED NOT DETECTED Final   Enteroaggregative E coli (EAEC) NOT DETECTED NOT DETECTED Final   Enteropathogenic E coli (EPEC) NOT DETECTED NOT DETECTED Final   Enterotoxigenic E coli (ETEC) NOT DETECTED NOT DETECTED Final   Shiga like toxin producing E coli (STEC) NOT DETECTED NOT DETECTED Final   Shigella/Enteroinvasive E coli (EIEC) NOT DETECTED NOT DETECTED Final   Cryptosporidium NOT DETECTED NOT DETECTED Final   Cyclospora cayetanensis NOT DETECTED NOT DETECTED Final   Entamoeba histolytica NOT DETECTED NOT DETECTED Final   Giardia lamblia NOT DETECTED NOT DETECTED Final   Adenovirus F40/41 NOT DETECTED NOT DETECTED Final   Astrovirus NOT DETECTED NOT DETECTED Final   Norovirus GI/GII NOT DETECTED NOT DETECTED Final   Rotavirus A NOT DETECTED NOT DETECTED Final   Sapovirus (I, II, IV, and V) NOT DETECTED NOT DETECTED Final  C difficile quick scan w PCR reflex     Status: None   Collection  Time: 01/27/17 10:13 AM  Result Value Ref Range Status   C Diff antigen NEGATIVE  NEGATIVE Final   C Diff toxin NEGATIVE NEGATIVE Final   C Diff interpretation No C. difficile detected.  Final  Culture, blood (Routine X 2) w Reflex to ID Panel     Status: None   Collection Time: 01/27/17  1:35 PM  Result Value Ref Range Status   Specimen Description BLOOD RIGHT HAND  Final   Special Requests IN PEDIATRIC BOTTLE Blood Culture adequate volume  Final   Culture NO GROWTH 5 DAYS  Final   Report Status 02/01/2017 FINAL  Final  Culture, blood (Routine X 2) w Reflex to ID Panel     Status: None   Collection Time: 01/27/17  1:35 PM  Result Value Ref Range Status   Specimen Description BLOOD LEFT ARM  Final   Special Requests IN PEDIATRIC BOTTLE Blood Culture adequate volume  Final   Culture NO GROWTH 5 DAYS  Final   Report Status 02/01/2017 FINAL  Final    Radiology Reports Dg Chest 1 View  Result Date: 01/21/2017 CLINICAL DATA:  Status post intubation today. EXAM: CHEST 1 VIEW COMPARISON:  Single-view of the chest earlier today. FINDINGS: New NG tube courses into the stomach and below the inferior margin of film. New endotracheal tube is in place with the tip at the level of clavicular heads in good position well above the carina. Right IJ approach dialysis catheter is again seen. Cardiomegaly and interstitial edema are unchanged. Atherosclerosis is noted. IMPRESSION: ETT and NG tube projecting good position. Cardiomegaly and mild interstitial edema. Electronically Signed   By: Inge Rise M.D.   On: 01/21/2017 12:01   Ct Head Wo Contrast  Result Date: 01/21/2017 CLINICAL DATA:  Slurred speech. Recent hospitalization for atrial fibrillation, on heparin. Altered mental status. EXAM: CT HEAD WITHOUT CONTRAST TECHNIQUE: Contiguous axial images were obtained from the base of the skull through the vertex without intravenous contrast. COMPARISON:  01/20/2017. FINDINGS: Brain: Redemonstrated is a LEFT frontal mixed attenuation collection, biconvex on coronal imaging, with low  attenuation fluid anteriorly, and hyperdense acute hemorrhage posteriorly. Maximum thickness of between 13 and 15 mm. Acute LEFT parafalcine subdural hemorrhage redemonstrated as well, small volume. No clear areas of cerebral infarction or vasogenic edema. 2 mm of LEFT-to-RIGHT shift. Vascular: No hyperdense vessel or unexpected calcification. Skull: Normal. Negative for fracture or focal lesion. Sinuses/Orbits: No acute finding. Other: None. IMPRESSION: Stable LEFT frontal mixed attenuation collections, as described above. 2 mm LEFT-to-RIGHT shift. I believe the collections could both be extra-axial, representing subdural/epidural hematoma/hygroma. Continued surveillance warranted. Patient could not cooperate for MRI. If clinically indicated, repeat could be performed with sedation. Electronically Signed   By: Staci Righter M.D.   On: 01/21/2017 10:18   Ct Head Wo Contrast  Addendum Date: 01/20/2017   ADDENDUM REPORT: 01/20/2017 16:18 ADDENDUM: Study discussed by telephone with Dr. Evette Doffing on the Internal Medicine Team On 01/20/2017 at 1545 hours. He advised that the patient has been hospitalized in the ICU and on heparin recently for a fib. This factor argues against the possibility of venous thrombosis, and in favor of a coagulopathy related bleed. No recent trauma. He plans a follow-up Brain MRI without and with contrast (I advised contrast even though the patient is recently on dialysis) when feasible. Electronically Signed   By: Genevie Ann M.D.   On: 01/20/2017 16:18   Result Date: 01/20/2017 CLINICAL DATA:  61 year old female with mental status changes  since this morning and increased confusion throughout the day. Dialysis patient. EXAM: CT HEAD WITHOUT CONTRAST TECHNIQUE: Contiguous axial images were obtained from the base of the skull through the vertex without intravenous contrast. COMPARISON:  Head CT without contrast 10/02/2009 FINDINGS: Brain: In 2010 the left frontal lobe appeared within normal  limits. Now there is both a 3 cm oval area of hypodensity within the anterior left frontal lobe (series 3, image 17), and an adjacent mixed density rounded hemorrhage or mass like area of about 2.6 cm. There does appear to be a small volume of superimposed left parasagittal subdural hemorrhage up to 7 mm in thickness. There is not an area of vasogenic edema in the left frontal lobe. Outside of the mixed density abnormality in the anterior left frontal lobe gray and white matter differentiation appears normal throughout the brain. No significant intracranial mass effect. No ventriculomegaly. Normal basilar cisterns. Partially empty sella. No cortical encephalomalacia identified. Vascular: Aside from hyperdensity involving the left superior sagittal sinus at the area of presumed subdural blood there is no suspicious intracranial vascular hyperdensity. Skull: Hyperostosis of the calvarium. Small 6-7 mm oval lucent lesions in the left occipital bone (series 4, image 24) and left frontal bone (image 44) are identified. However, the left frontal bone lesion is unchanged from 2010, and the left occipital bone lesion was probably present at that time although much smaller. No definite destructive osseous lesion. Sinuses/Orbits: Visualized paranasal sinuses and mastoids are stable and well pneumatized. Other: No acute orbit or scalp soft tissue finding. IMPRESSION: 1. Unusual appearing mixed density mass like area along the surface of the anterior frontal lobe, with cytotoxic type edema as well as a rounded 2 cm area of hemorrhage. Apparent small volume superimposed nearby a left parafalcine subdural blood. 2. Etiology is unclear at this point with top differential considerations including: trauma such as Hemorrhagic Contusion and SDH, Venous Infarct (superior cortical vein and/or short segment sagittal sinus thrombosis), and tumor. 3. Negative CT appearance of the brain elsewhere. 4. Recommend follow-up brain MRI,  preferably without and with IV contrast, to further characterize. 5. Small lucent areas in the skull appear chronic and are probably benign - possibly hyperparathyroid or renal osteodystrophy related in the setting of chronic renal failure. Electronically Signed: By: Genevie Ann M.D. On: 01/20/2017 15:39   Mr Jodene Nam Head Wo Contrast  Result Date: 01/22/2017 CLINICAL DATA:  Subdural hematoma following recent cardioversion. EXAM: MRA HEAD WITHOUT CONTRAST TECHNIQUE: Angiographic images of the Circle of Willis were obtained using MRA technique without intravenous contrast. COMPARISON:  MRI brain 01/21/2017 FINDINGS: Source images again demonstrate the left extra-axial hemorrhages. The internal carotid arteries are within normal limits from the high cervical segments through the ICA termini bilaterally. 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 The A1 and M1 segments are normal. The right A1 segment is dominant. The anterior communicating artery is patent. The MCA bifurcations are intact. ACA and MCA branch vessels are within normal limits. The left vertebral artery slightly dominant to the right. The left PICA origin is below normal. The vertebrobasilar junction is normal. The right AICA is dominant. The basilar artery is within normal limits. The posterior cerebral arteries originate the basilar tip. PCA branch vessels are unremarkable. IMPRESSION: 1. 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 2. Otherwise normal MRA circle of Willis without significant proximal stenosis or occlusion. No other aneurysms are present. Electronically Signed   By: San Morelle M.D.   On: 01/22/2017 08:38  Mr Brain Wo Contrast  Result Date: 01/21/2017 CLINICAL DATA:  Altered mental status.  Intracranial hemorrhage. EXAM: MRI HEAD WITHOUT CONTRAST TECHNIQUE: Multiplanar, multiecho pulse sequences of the brain and surrounding structures were obtained without intravenous contrast. COMPARISON:  Head CT  01/21/2017 FINDINGS: The examination had to be discontinued prior to completion, and axial T1 and coronal T2 sequences were not obtained. Brain: Left-sided subdural hematoma is greatest over the frontal convexity and measures up to 12 mm in thickness, unchanged from the CT earlier today. The more anterior component is T1, T2, and FLAIR hyperintense and simpler in appearance, corresponding to the low-density fluid on CT. At the posterior aspect of this are more complex appearing blood products, corresponding to the hyperdense hemorrhage on CT. Minimal subdural hematoma extends posteriorly over the left cerebral convexity as well as into the middle cranial fossa over the temporal lobe and into the interhemispheric fissure. There is also trace subdural hematoma over the anterior right frontal lobe. The left-sided hematoma results in mild left cerebral hemispheric sulcal effacement and minimal rightward midline shift of 3 mm, unchanged. There is no acute infarct. No significant cerebral white matter disease or brain edema is identified. Cerebral volume is within normal limits for age. Vascular: Major intracranial vascular flow voids are preserved. Skull and upper cervical spine: Decreased bone marrow signal intensity likely reflects known underlying anemia. Sinuses/Orbits: Unremarkable orbits. Fluid in the nasopharynx. Clear paranasal sinuses. Trace bilateral mastoid fluid. Other: None. IMPRESSION: 1. Slightly incomplete noncontrast brain MRI. 2. Left-sided subdural hematoma, unchanged from earlier CT. Trace rightward midline shift. 3. Trace right frontal subdural hematoma. 4. No acute infarct. Electronically Signed   By: Logan Bores M.D.   On: 01/21/2017 15:53   Ir Fluoro Guide Cv Line Right  Result Date: 01/06/2017 INDICATION: 61 year old female with renal failure EXAM: TUNNELED CENTRAL VENOUS HEMODIALYSIS CATHETER PLACEMENT WITH ULTRASOUND AND FLUOROSCOPIC GUIDANCE MEDICATIONS: Vancomycin 1.5 gm IV . The  antibiotic was given in an appropriate time interval prior to skin puncture. ANESTHESIA/SEDATION: Moderate (conscious) sedation was employed during this procedure. A total of Versed 2 point mg and Fentanyl 100 mcg was administered intravenously. Moderate Sedation Time: 20 minutes. The patient's level of consciousness and vital signs were monitored continuously by radiology nursing throughout the procedure under my direct supervision. FLUOROSCOPY TIME:  Fluoroscopy Time: 0 minutes 12 seconds (2 mGy). COMPLICATIONS: None PROCEDURE: Informed written consent was obtained from the patient after a discussion of the risks, benefits, and alternatives to treatment. Questions regarding the procedure were encouraged and answered. The right neck and chest were prepped with chlorhexidine in a sterile fashion, and a sterile drape was applied covering the operative field. Maximum barrier sterile technique with sterile gowns and gloves were used for the procedure. A timeout was performed prior to the initiation of the procedure. After creating a small venotomy incision, a micropuncture kit was utilized to access the right internal jugular vein under direct, real-time ultrasound guidance after the overlying soft tissues were anesthetized with 1% lidocaine with epinephrine. Ultrasound image documentation was performed. The microwire was marked to measure appropriate internal catheter length. External tunneled length was estimated. A total tip to cuff length of 19 cm was selected. Skin and subcutaneous tissues of chest wall below the clavicle were generously infiltrated with 1% lidocaine for local anesthesia. A small stab incision was made with 11 blade scalpel. The selected hemodialysis catheter was tunneled in a retrograde fashion from the anterior chest wall to the venotomy incision. A guidewire was advanced to  the level of the IVC and the micropuncture sheath was exchanged for a peel-away sheath. The catheter was then placed  through the peel-away sheath with tips ultimately positioned within the superior aspect of the right atrium. Final catheter positioning was confirmed and documented with a spot radiographic image. The catheter aspirates and flushes normally. The catheter was flushed with appropriate volume heparin dwells. The catheter exit site was secured with a 0-Prolene retention suture. The venotomy incision was closed Derma bond and sterile dressing. Dressings were applied at the chest wall. Patient tolerated the procedure well and remained hemodynamically stable throughout. No complications were encountered and no significant blood loss encountered. IMPRESSION: Status post right IJ tunneled hemodialysis catheter, 19 cm tip to cuff. Catheter ready for use. Signed, Dulcy Fanny. Earleen Newport, DO [facility] Radiology Electronically Signed   By: Corrie Mckusick D.O.   On: 01/06/2017 17:38   Ir US Guide Vasc Access Right  Result Date: 01/06/2017 INDICATION: 61 year old female with renal failure EXAM: TUNNELED CENTRAL VENOUS HEMODIALYSIS CATHETER PLACEMENT WITH ULTRASOUND AND FLUOROSCOPIC GUIDANCE MEDICATIONS: Vancomycin 1.5 gm IV . The antibiotic was given in an appropriate time interval prior to skin puncture. ANESTHESIA/SEDATION: Moderate (conscious) sedation was employed during this procedure. A total of Versed 2 point mg and Fentanyl 100 mcg was administered intravenously. Moderate Sedation Time: 20 minutes. The patient's level of consciousness and vital signs were monitored continuously by radiology nursing throughout the procedure under my direct supervision. FLUOROSCOPY TIME:  Fluoroscopy Time: 0 minutes 12 seconds (2 mGy). COMPLICATIONS: None PROCEDURE: Informed written consent was obtained from the patient after a discussion of the risks, benefits, and alternatives to treatment. Questions regarding the procedure were encouraged and answered. The right neck and chest were prepped  with chlorhexidine in a sterile fashion, and a sterile drape was applied covering the operative field. Maximum barrier sterile technique with sterile gowns and gloves were used for the procedure. A timeout was performed prior to the initiation of the procedure. After creating a small venotomy incision, a micropuncture kit was utilized to access the right internal jugular vein under direct, real-time ultrasound guidance after the overlying soft tissues were anesthetized with 1% lidocaine with epinephrine. Ultrasound image documentation was performed. The microwire was marked to measure appropriate internal catheter length. External tunneled length was estimated. A total tip to cuff length of 19 cm was selected. Skin and subcutaneous tissues of chest wall below the clavicle were generously infiltrated with 1% lidocaine for local anesthesia. A small stab incision was made with 11 blade scalpel. The selected hemodialysis catheter was tunneled in a retrograde fashion from the anterior chest wall to the venotomy incision. A guidewire was advanced to the level of the IVC and the micropuncture sheath was exchanged for a peel-away sheath. The catheter was then placed through the peel-away sheath with tips ultimately positioned within the superior aspect of the right atrium. Final catheter positioning was confirmed and documented with a spot radiographic image. The catheter aspirates and flushes normally. The catheter was flushed with appropriate volume heparin dwells. The catheter exit site was secured with a 0-Prolene retention suture. The venotomy incision was closed Derma bond and sterile dressing. Dressings were applied at the chest wall. Patient tolerated the procedure well and remained hemodynamically stable throughout. No complications were encountered and no significant blood loss encountered. IMPRESSION: Status post right IJ tunneled hemodialysis catheter, 19 cm tip to cuff. Catheter ready for use. Signed, Dulcy Fanny.  Earleen Newport, DO [facility] Inter[facility] Radiology  Specialists [facility] Radiology Electronically Signed   By: Corrie Mckusick D.O.   On: 01/06/2017 17:38   Dg Chest Port 1 View  Result Date: 01/27/2017 CLINICAL DATA:  Extubation. EXAM: PORTABLE CHEST 1 VIEW COMPARISON:  01/26/2017. FINDINGS: Interim extubation and removal of NG tube. Dual-lumen right IJ line and left IJ line stable position. Cardiomegaly with pulmonary vascular prominence and bilateral interstitial prominence consistent with CHF. Pneumonitis cannot be excluded . Interim slight improvement of left base aeration. No pneumothorax. IMPRESSION: 1. Interim extubation and removal of NG tube. Dual-lumen right IJ line left IJ line stable position. 2. Persistent cardiomegaly, pulmonary venous congestion, bilateral interstitial prominence suggesting CHF. No change. 3. Interim improved aeration left lung base . Electronically Signed   By: Marcello Moores  Register   On: 01/27/2017 07:38   Dg Chest Port 1 View  Result Date: 01/26/2017 CLINICAL DATA:  Intubated patient, respiratory failure. EXAM: PORTABLE CHEST 1 VIEW COMPARISON:  Portable chest x-ray of January 24, 2017 FINDINGS: The lungs are reasonably well inflated. Patchy interstitial and alveolar opacities persist bilaterally. The retrocardiac region on the left remains dense but slight improved aeration is noted here today. The cardiac silhouette remains enlarged. The central pulmonary vascularity remains engorged. A small left pleural effusion is suspected. The endotracheal tube tip lies 2.5 cm above the carina. The feeding tube tip projects below the inferior margin of the image. The dual-lumen dialysis catheter tip projects over the distal third of the SVC. The left internal jugular venous catheter tip projects over the proximal SVC. IMPRESSION: Slight interval improvement in the appearance of the lungs consistent with resolving edema or pneumonia or combination of the two. Persistent left lower lobe  atelectasis or pneumonia with probable small left pleural effusion. Electronically Signed   By: David  Martinique M.D.   On: 01/26/2017 07:57   Dg Chest Port 1 View  Result Date: 01/24/2017 CLINICAL DATA:  Intubated patient, acute respiratory failure. EXAM: PORTABLE CHEST 1 VIEW COMPARISON:  Portable chest x-ray of January 23, 2017 FINDINGS: The cardiac silhouette remains enlarged. The retrocardiac region remains dense with obscuration of the left hemidiaphragm. The interstitial markings remain increased in the pulmonary vascularity engorged. There is calcification in the wall of the aortic arch. The endotracheal tube tip lies 2.7 cm above the carina. The esophagogastric tube tip projects below the inferior margin of the image. The dual-lumen dialysis type catheter tip projects over the midportion of the SVC. The left internal jugular venous catheter tip projects over the proximal SVC. IMPRESSION: CHF with pulmonary interstitial edema. Left lower lobe atelectasis or pneumonia. The support tubes are in reasonable position. There has not been significant interval change since yesterday's study. The support tubes are in reasonable position. Thoracic aortic atherosclerosis. Electronically Signed   By: David  Martinique M.D.   On: 01/24/2017 07:22   Dg Chest Port 1 View  Result Date: 01/23/2017 CLINICAL DATA:  Acute respiratory failure. EXAM: PORTABLE CHEST 1 VIEW COMPARISON:  One-view chest x-ray 01/22/2017 FINDINGS: Endotracheal tube is stable, 4.4 cm above the carina. NG tube courses off the inferior border the film. A left IJ line is stable. The right IJ dialysis catheter is stable. The heart is enlarged. Mild edema has slightly increased. Retrocardiac opacification remains. IMPRESSION: 1. Slight increase and mild edema. 2. Stable retrocardiac opacification likely reflecting a combination of atelectasis and effusion. Infection is not excluded. 3. The support apparatus is stable. Electronically Signed   By: San Morelle M.D.   On: 01/23/2017 07:58  Dg Chest Port 1 View  Result Date: 01/22/2017 CLINICAL DATA:  Ventilator dependent EXAM: PORTABLE CHEST 1 VIEW COMPARISON:  01/21/2017 FINDINGS: Endotracheal tube, central venous lines and NG tube are unchanged. Stable enlarged cardiac silhouette. Mild central venous congestion similar prior. LEFT basilar atelectasis is increased. IMPRESSION: 1. Increase in LEFT basilar atelectasis. 2. Central venous pulmonary congestion similar prior. 3. Stable support apparatus. Electronically Signed   By: Suzy Bouchard M.D.   On: 01/22/2017 07:40   Dg Chest Port 1 View  Result Date: 01/21/2017 CLINICAL DATA:  Central line placement EXAM: PORTABLE CHEST 1 VIEW COMPARISON:  Portable exam 1300 hours compared 01/21/2017 FINDINGS: Tip of endotracheal tube projects 4.1 cm above carina. Nasogastric tube extends into stomach. RIGHT jugular dual-lumen catheter projects over cavoatrial junction. New RIGHT jugular central venous catheter with tip projecting over SVC. EKG leads project over chest. Enlargement of cardiac silhouette with pulmonary vascular congestion. Atherosclerotic calcification aorta. Slightly improved interstitial edema. No pleural effusion or pneumothorax. IMPRESSION: No pneumothorax following LEFT jugular line placement. Slightly improved pulmonary edema. Electronically Signed   By: Lavonia Dana M.D.   On: 01/21/2017 13:16   Dg Chest Port 1 View  Result Date: 01/21/2017 CLINICAL DATA:  Shortness of Breath EXAM: PORTABLE CHEST 1 VIEW COMPARISON:  01/17/2017 FINDINGS: Cardiac shadow remains enlarged. Dialysis catheter is again seen. The left jugular central line is been removed. The lungs are well aerated bilaterally. No focal infiltrate or sizable effusion is seen. Mild fullness of the pulmonary vasculature is noted likely related to a degree of volume overload. No bony abnormality is seen. IMPRESSION: Mild volume overload likely related to the underlying Clinical state.  No new focal abnormality is seen. Electronically Signed   By: Inez Catalina M.D.   On: 01/21/2017 10:03   Dg Chest Port 1 View  Result Date: 01/17/2017 CLINICAL DATA:  Shortness of breath. EXAM: PORTABLE CHEST 1 VIEW COMPARISON:  Single-view of the chest 01/15/2017 and 01/10/2017. FINDINGS: There is marked cardiomegaly but the lungs are clear without edema. No pneumothorax or pleural effusion. Left IJ catheter and right IJ dialysis catheter are unchanged. Atherosclerosis is noted. IMPRESSION: Cardiomegaly without acute disease. Atherosclerosis. Electronically Signed   By: Inge Rise M.D.   On: 01/17/2017 10:22   Dg Chest Port 1 View  Result Date: 01/15/2017 CLINICAL DATA:  Central line placement EXAM: PORTABLE CHEST 1 VIEW COMPARISON:  Five days ago FINDINGS: Dialysis catheter on the right with tip at the SVC. Left IJ central line with tip at the upper SVC. Marked cardiomegaly. Interstitial opacity seen previously is essentially resolved. No asymmetric airspace disease, effusion, or pneumothorax. IMPRESSION: 1. Stable positioning of central lines, tips overlapping the SVC. 2. Pulmonary edema seen 5 days ago is improved/resolved. Electronically Signed   By: Monte Fantasia M.D.   On: 01/15/2017 07:31   Dg Chest Port 1 View  Result Date: 01/10/2017 CLINICAL DATA:  Central line placement. EXAM: PORTABLE CHEST 1 VIEW COMPARISON:  01/09/2017 FINDINGS: The right IJ catheter is stable. There is also a small caliber right-sided PICC line which is stable. New left IJ center venous catheter tip is in the mid SVC with the tip against the lateral wall. No complicating features. Stable cardiac enlargement and diffuse pulmonary edema. No definite pleural effusions. IMPRESSION: Support apparatus in good position without complicating features. Persistent cardiac enlargement and pulmonary edema. Electronically Signed   By: Marijo Sanes M.D.   On: 01/10/2017 13:09   Dg Chest Port 1 View  Result Date:  01/09/2017 CLINICAL DATA:  Fever. Hx stroke, HTN, PNA, CKD, former smoker. EXAM: PORTABLE CHEST 1 VIEW COMPARISON:  01/04/2017 FINDINGS: Since prior exam, the left internal jugular central venous line has been removed. A new tunneled right internal jugular central venous line has been placed. The tip projects the right atrium. The right subclavian central venous line is stable. There is persistent cardiomegaly, central vascular congestion and mild interstitial thickening and hazy central airspace opacity consistent with pulmonary edema. No pneumothorax. IMPRESSION: 1. Cardiomegaly with mild persistent pulmonary edema similar to the previous exam. 2. New right internal jugular tunneled central venous line catheter tip projects in the right atrium. No pneumothorax. Electronically Signed   By: Lajean Manes M.D.   On: 01/09/2017 07:36   Dg Abd Portable 1v  Result Date: 01/26/2017 CLINICAL DATA:  Feeding tube placement. EXAM: PORTABLE ABDOMEN - 1 VIEW COMPARISON:  01/25/2017 FINDINGS: Feeding tube is unchanged in position with tip again at the expected level of the ligament of Treitz. No dilated loops of bowel are seen in the included left upper abdomen. Bibasilar lung base opacities are partially visualized, more fully evaluated on chest radiograph earlier today. No acute osseous abnormality is seen. IMPRESSION: Unchanged feeding tube with tip of the level of the ligament of Treitz. Electronically Signed   By: Logan Bores M.D.   On: 01/26/2017 10:22   Dg Abd Portable 1v  Result Date: 01/25/2017 CLINICAL DATA:  61 year old female feeding tube placement. Initial encounter. EXAM: PORTABLE ABDOMEN - 1 VIEW COMPARISON:  01/22/2017 and earlier. FINDINGS: Portable AP supine view at 1050 hours. Enteric tube courses through the stomach and into the duodenum with tip at the level of the ligament of Treitz. Negative visible bowel gas pattern. Partially visible increased left lung base opacity. No acute osseous abnormality  identified. IMPRESSION: 1. Enteric tube placed, tip at the ligament of Treitz level. 2. Negative visible bowel gas pattern. Electronically Signed   By: Genevie Ann M.D.   On: 01/25/2017 11:11   Dg Abd Portable 1v  Result Date: 01/22/2017 CLINICAL DATA:  Orogastric tube placement.  Initial encounter. EXAM: PORTABLE ABDOMEN - 1 VIEW COMPARISON:  CT of the abdomen and pelvis performed 03/08/2016 FINDINGS: The patient's enteric tube is noted ending overlying the body of the stomach. The visualized bowel gas pattern is unremarkable. Scattered air and stool filled loops of colon are seen; no abnormal dilatation of small bowel loops is seen to suggest small bowel obstruction. No free intra-abdominal air is identified, though evaluation for free air is limited on a single supine view. The visualized osseous structures are within normal limits; the sacroiliac joints are unremarkable in appearance. IMPRESSION: Enteric tube noted ending overlying the body of the stomach. Electronically Signed   By: Garald Balding M.D.   On: 01/22/2017 00:20   Dg Swallowing Func-speech Pathology  Result Date: 01/31/2017 Objective Swallowing Evaluation: Type of Study: MBS-Modified Barium Swallow Study Patient Details Name: Paige Marshall MRN: [id number] Date of Birth: Jan 10, 1956 Today's Date: 01/31/2017 Time: SLP Start Time (ACUTE ONLY): 1350-SLP Stop Time (ACUTE ONLY): 1450 SLP Time Calculation (min) (ACUTE ONLY): 60 min Past Medical History: Past Medical History: Diagnosis Date . Asthma  . Chronic combined systolic and diastolic CHF (congestive heart failure) ([place])  . CKD (chronic kidney disease), stage III  . COPD (chronic obstructive pulmonary disease) ([place])  . Degenerative joint disease of both lower legs  . Former smoker  . Gout   hx in "both knees" (01/14/2015) . H. pylori infection  01/03/2014  +breath test.   . High cholesterol  . Hypertension  . Morbid obesity ([place])  . NICM (nonischemic cardiomyopathy) ([place])   a. LHC 01/2015: normal  cors, EF 45-50%. b. EF 50-55% in 08/2016. c. EF 33% 01/2017 . OSA (obstructive sleep apnea)  . Persistent atrial fibrillation ([place])  . Pneumonia X 2 . Pulmonary hypertension ([place])  . Rheumatoid arthritis ([place])  . Stroke [facility])  Past Surgical History: Past Surgical History: Procedure Laterality Date . ANGIOGRAM/LV (CONGENITAL)  2007 . BREATH TEK H PYLORI N/A 12/31/2013  Procedure: BREATH TEK H PYLORI;  Surgeon: Gayland Curry, MD;  Location: [facility] ENDOS[facility];  Service: General;  Laterality: N/A; . CARDIOVERSION N/A 01/13/2017  Procedure: CARDIOVERSION;  Surgeon: Jolaine Artist, MD;  Location: [facility] ENDOSCOPY;  Service: Cardiovascular;  Laterality: N/A; . CARDIOVERSION N/A 01/20/2017  Procedure: CARDIOVERSION;  Surgeon: Larey Dresser, MD;  Location: [place];  Service: Cardiovascular;  Laterality: N/A; . CORNEAL TRANSPLANT Right ~ 2010 . CORONARY ANGIOPLASTY WITH STENT PLACEMENT  11/04/2005  "1" . DOPPLER ECHOCARDIOGRAPHY    2 D   EF of 45% . EYE SURGERY   . IR GENERIC HISTORICAL  01/06/2017  IR FLUORO GUIDE CV LINE RIGHT 01/06/2017 Corrie Mckusick, DO [facility]-INTERV RAD . IR GENERIC HISTORICAL  01/06/2017  IR US GUIDE VASC ACCESS RIGHT 01/06/2017 Corrie Mckusick, DO [facility] . LEFT HEART CATHETERIZATION WITH CORONARY ANGIOGRAM N/A 01/20/2015  Procedure: LEFT HEART CATHETERIZATION WITH CORONARY ANGIOGRAM;  Surgeon: Lorretta Harp, MD;  Location: [facility] CATH LAB;  Service: Cardiovascular;  Laterality: N/A; . RIGHT HEART CATH N/A 01/07/2017  Procedure: Right Heart Cath;  Surgeon: Jolaine Artist, MD;  Location: [facility] CV LAB;  Service: Cardiovascular;  Laterality: N/A; . sleep study  2011 . stress myocardial dipyridamole perfusion   . TEE WITHOUT CARDIOVERSION N/A 01/13/2017  Procedure: TRANSESOPHAGEAL ECHOCARDIOGRAM (TEE);  Surgeon: Jolaine Artist, MD;  Location: [facility];  Service: Cardiovascular;  Laterality: N/A; . [place] . venous duplex ultrasound  2642 HPI: 61 year old woman with poorly controlled OHS,  chronic combined systolic and diastolic CHF, a-fib/a-flutter on chronic anticoagulation, hypertension, COPD, CKD Stage 3. Admitted to ED from [place] on 3/18 foracute decompensated CHF complicated by cardiorenal syndrome. During stay developed acute on chronic kidney disease requiring CRRT, now being followed by nephrology for PRN HD. Blood culture positive for VRE bacteremia being treated with ampicillin and ceftriaxone. Transferred to step-down on 4/11. On 4/12 noted to be more lethargic and aphasic. CT Head revealed mixed density mass like area along the surface of the anterior frontal lobe with cytotoxic edema as well as a rounded 2 cm hemorrhage.ETT 4/13-4/18. Initial FEES 01/27/17 with recs for NPO.   Subjective: pleasant, communicative Assessment / Plan / Recommendation CHL IP CLINICAL IMPRESSIONS 01/31/2017 Clinical Impression Pt presents with much improved swallow function with adequate mastication, delay to the pyriforms for thin liquids, but no residue post-swallow and no penetration nor aspiration.  Pt taxed with successive, large thin liquid boluses, but maintained adequate airway protection.  Recommend resuming a regular consistency diet with thin liquids; give meds whole in puree.  NG may be discontinued.   SLP Visit Diagnosis Dysphagia, oropharyngeal phase (R13.12) Attention and concentration deficit following -- Frontal lobe and executive function deficit following -- Impact on safety and function No limitations   CHL IP TREATMENT RECOMMENDATION 01/27/2017 Treatment Recommendations Therapy as outlined in treatment plan below   Prognosis 01/27/2017 Prognosis for Safe Diet Advancement Good Barriers to Reach Goals  Cognitive deficits Barriers/Prognosis Comment -- CHL IP DIET RECOMMENDATION 01/31/2017 SLP Diet Recommendations Regular solids;Thin liquid Liquid Administration via Cup;Straw Medication Administration Whole meds with puree Compensations -- Postural Changes Seated upright at 90 degrees   CHL IP  OTHER RECOMMENDATIONS 01/31/2017 Recommended Consults -- Oral Care Recommendations Oral care BID Other Recommendations --   CHL IP FOLLOW UP RECOMMENDATIONS 01/28/2017 Follow up Recommendations Inpatient Rehab   CHL IP FREQUENCY AND DURATION 01/31/2017 Speech Therapy Frequency (ACUTE ONLY) min 1 x/week Treatment Duration 1 week      CHL IP ORAL PHASE 01/31/2017 Oral Phase WFL Oral - Pudding Teaspoon -- Oral - Pudding Cup -- Oral - Honey Teaspoon -- Oral - Honey Cup -- Oral - Nectar Teaspoon -- Oral - Nectar Cup -- Oral - Nectar Straw -- Oral - Thin Teaspoon -- Oral - Thin Cup -- Oral - Thin Straw -- Oral - Puree -- Oral - Mech Soft -- Oral - Regular -- Oral - Multi-Consistency -- Oral - Pill -- Oral Phase - Comment --  CHL IP PHARYNGEAL PHASE 01/31/2017 Pharyngeal Phase WFL Pharyngeal- Pudding Teaspoon -- Pharyngeal -- Pharyngeal- Pudding Cup -- Pharyngeal -- Pharyngeal- Honey Teaspoon NT Pharyngeal -- Pharyngeal- Honey Cup -- Pharyngeal -- Pharyngeal- Nectar Teaspoon WFL Pharyngeal -- Pharyngeal- Nectar Cup -- Pharyngeal -- Pharyngeal- Nectar Straw -- Pharyngeal -- Pharyngeal- Thin Teaspoon WFL Pharyngeal -- Pharyngeal- Thin Cup WFL Pharyngeal -- Pharyngeal- Thin Straw -- Pharyngeal -- Pharyngeal- Puree WFL Pharyngeal -- Pharyngeal- Mechanical Soft -- Pharyngeal -- Pharyngeal- Regular -- Pharyngeal -- Pharyngeal- Multi-consistency -- Pharyngeal -- Pharyngeal- Pill -- Pharyngeal -- Pharyngeal Comment --  No flowsheet data found. No flowsheet data found. [name] 01/31/2017, 3:03 PM              Mr Mrv Head Wo Cm  Result Date: 01/22/2017 CLINICAL DATA:  Extra-axial hemorrhage following recent cardioversion. EXAM: MR VENOGRAM the HEAD WITHOUT CONTRAST TECHNIQUE: Angiographic images of the intracranial venous structures were obtained using MRV technique without intravenous contrast. COMPARISON:  MRI brain 01/21/2017.  MRA head from the same date. FINDINGS: The superior sagittal sinus is patent. The  straight sinus and deep cerebral veins are patent. The transverse and sigmoid sinuses are codominant and patent bilaterally. The cortical veins are unremarkable. The source images demonstrates a stable appearance of the extra-axial hemorrhages. IMPRESSION: Negative MR venogram. Electronically Signed   By: San Morelle M.D.   On: 01/22/2017 08:40    Time Spent in minutes  30   Jani Gravel M.D on 02/05/2017 at 9:37 AM  Between 7am to 7pm - Pager - [phone]  After 7pm go to [email or web address] - password [facility]  [facility] -  Office  [phone]

## 2017-02-05 NOTE — Progress Notes (Signed)
Discussed with [facility]  [facility] to take over as primary team starting 02/06/2017

## 2017-02-05 NOTE — Progress Notes (Signed)
[facility] Progress Note    Assessment/ Plan:   1. Anuric AKI on CKD 4: due to persistent shock. Has done very well with intermittent HD.  - Will continue with MWF HD - Vein mapping pending - VVS consulted 4/27. Reluctant to place access in L arm due to ischemic index finger on L. Possible R AVF or AVG next week. Have also ordered upper extremity arterial doppler.  2. Abdominal Pain- patient noted to have lower bilateral abdominal pain last night. The pain has resolved this morning. Abdominal exam benign. - CT abdomen/pelvis ordered last night per primary team   3. Atrial Fibrillation / acute on chronic combined systolic and diastolic HF- continues to be in A-fib. Rate controlled. - HF team following - PO Amiodarone - No anticoagulation for recent SDH  4. Persistent shock s/p VRE bacteremia- resolved. BPs much better. - On Midodrine 15mg  tid - Weaning stress dose steroids - Completed abx 01/26/17, repeat blood cultures 4/19 NGTD  5.  SDH: stable - Not a candidate for anticoagulation  6.  Anemia: s/p 2 units pRBCs 4/22. Hgb stable. - Aranesp q Monday   Subjective:    Patient is tearful this morning. She states she had to lay in bed all day yesterday because they could not transfer her to a room with a lift to help get her out of bed. She would really like to be up in the chair today. She otherwise has no concerns. States HD went well yesterday.   Objective:   BP 128/60   Pulse (!) 101   Temp 98 F (36.7 C) (Oral)   Resp 14   Ht 5\' 8"  (1.727 m)   Wt 249 lb (112.9 kg)   LMP 09/07/2011   SpO2 94%   BMI 37.86 kg/m   Intake/Output Summary (Last 24 hours) at 02/05/17 1241 Last data filed at 02/05/17 9629  Gross per 24 hour  Intake              480 ml  Output             1500 ml  Net            -1020 ml   Weight change:   Physical Exam: Gen: Laying in bed, tearful CVS: Irregularly irregular rhythm, regular rate Resp: CTAB, normal work of  breathing Abd: obese, soft, non-tender, no rebound, no guarding Ext: 1+ pitting edema in the lower extremities bilaterally ACCESS: R TDC in place  Imaging: No results found.  Labs: BMET  Recent Labs Lab 01/31/17 0506 02/01/17 0416 02/01/17 0417 02/02/17 0426 02/03/17 0402 02/04/17 0550 02/05/17 0515  NA 138 139 140 136 138  137 138 138  K 5.7* 4.3 4.4 4.8 4.3  3.9 4.2 3.5  CL 100* 98* 98* 96* 101  99* 99* 101  CO2 23 25 25 23 25  26 24 26   GLUCOSE 174* 107* 107* 103* 109*  108* 90 81  BUN 94* 67* 70* 92* 43*  42* 61* 26*  CREATININE 3.35* 2.82* 2.83* 3.98* 2.78*  2.83* 4.17* 2.54*  CALCIUM 8.4* 8.3* 8.2* 8.7* 8.3*  8.5* 8.7* 8.2*  PHOS 4.6 4.6 4.6 7.2* 5.8*  5.9* 7.3* 4.0   CBC  Recent Labs Lab 02/02/17 0453 02/03/17 0402 02/04/17 0550 02/05/17 0515  WBC 17.6* 13.8* 13.8* 13.8*  NEUTROABS 15.3* 12.0* 11.5* 11.4*  HGB 7.8* 7.9* 8.0* 8.3*  HCT 26.0* 27.5* 27.3* 28.7*  MCV 89.7 92.3 92.9 93.5  PLT 169 177 170 174  Medications:    . amiodarone  200 mg Oral BID  . darbepoetin (ARANESP) injection - NON-DIALYSIS  150 mcg Subcutaneous Q Mon-1800  . [START ON 02/06/2017] hydrocortisone sod succinate (SOLU-CORTEF) inj  12.5 mg Intravenous Q12H  . hydrocortisone sod succinate (SOLU-CORTEF) inj  25 mg Intravenous Q6H  . insulin aspart  2-6 Units Subcutaneous Q4H  . lanthanum  1,000 mg Oral TID WC  . mouth rinse  15 mL Mouth Rinse BID  . midodrine  15 mg Oral TID WC  . pantoprazole  40 mg Oral Daily      Hyman Bible, MD PGY-2 Family Medicine Resident 02/05/2017, 12:41 PM   I have seen and examined this patient and agree with plan and assessment in the above note with renal recommendations/intervention highlighted.  BP's have markedly improved.  She tolerated HD well.  Continue with IHD and plans for vascular access as above.  Will need to work on strength and mobility before she will be ready for outpatient dialysis.  Would consider LTC facility that  accomodates dialysis patients.   Broadus John A Adelyna Brockman,MD 02/05/2017 1:32 PM

## 2017-02-06 ENCOUNTER — Inpatient Hospital Stay (HOSPITAL_COMMUNITY): Payer: Medicare Other

## 2017-02-06 ENCOUNTER — Other Ambulatory Visit (HOSPITAL_COMMUNITY): Payer: Medicare Other

## 2017-02-06 DIAGNOSIS — I6032 Nontraumatic subarachnoid hemorrhage from left posterior communicating artery: Secondary | ICD-10-CM

## 2017-02-06 DIAGNOSIS — R1032 Left lower quadrant pain: Secondary | ICD-10-CM

## 2017-02-06 DIAGNOSIS — J90 Pleural effusion, not elsewhere classified: Secondary | ICD-10-CM

## 2017-02-06 DIAGNOSIS — D500 Iron deficiency anemia secondary to blood loss (chronic): Secondary | ICD-10-CM

## 2017-02-06 DIAGNOSIS — R1031 Right lower quadrant pain: Secondary | ICD-10-CM

## 2017-02-06 DIAGNOSIS — Z01810 Encounter for preprocedural cardiovascular examination: Secondary | ICD-10-CM

## 2017-02-06 LAB — COMPREHENSIVE METABOLIC PANEL
ALT: 57 U/L — ABNORMAL HIGH (ref 14–54)
AST: 48 U/L — AB (ref 15–41)
Albumin: 2.8 g/dL — ABNORMAL LOW (ref 3.5–5.0)
Alkaline Phosphatase: 96 U/L (ref 38–126)
Anion gap: 18 — ABNORMAL HIGH (ref 5–15)
BUN: 44 mg/dL — AB (ref 6–20)
CHLORIDE: 98 mmol/L — AB (ref 101–111)
CO2: 20 mmol/L — AB (ref 22–32)
CREATININE: 3.81 mg/dL — AB (ref 0.44–1.00)
Calcium: 8.8 mg/dL — ABNORMAL LOW (ref 8.9–10.3)
GFR, EST AFRICAN AMERICAN: 14 mL/min — AB (ref >60)
GFR, EST NON AFRICAN AMERICAN: 12 mL/min — AB (ref >60)
GLUCOSE: 88 mg/dL (ref 65–99)
Potassium: 4.9 mmol/L (ref 3.5–5.1)
Sodium: 136 mmol/L (ref 135–145)
Total Bilirubin: 3.2 mg/dL — ABNORMAL HIGH (ref 0.3–1.2)
Total Protein: 5 g/dL — ABNORMAL LOW (ref 6.5–8.1)

## 2017-02-06 LAB — GLUCOSE, CAPILLARY
GLUCOSE-CAPILLARY: 105 mg/dL — AB (ref 65–99)
GLUCOSE-CAPILLARY: 95 mg/dL (ref 65–99)
GLUCOSE-CAPILLARY: 103 mg/dL — AB (ref 65–99)
Glucose-Capillary: 96 mg/dL (ref 65–99)
Glucose-Capillary: 86 mg/dL (ref 65–99)
Glucose-Capillary: 69 mg/dL (ref 65–99)

## 2017-02-06 LAB — CBC WITH DIFFERENTIAL/PLATELET
BASOS PCT: 2 %
Basophils Absolute: 0.3 10*3/uL — ABNORMAL HIGH (ref 0.0–0.1)
EOS ABS: 0 10*3/uL (ref 0.0–0.7)
EOS PCT: 0 %
HCT: 28.6 % — ABNORMAL LOW (ref 36.0–46.0)
Hemoglobin: 8.3 g/dL — ABNORMAL LOW (ref 12.0–15.0)
LYMPHS ABS: 2.7 10*3/uL (ref 0.7–4.0)
Lymphocytes Relative: 20 %
MCH: 27.1 pg (ref 26.0–34.0)
MCHC: 29 g/dL — ABNORMAL LOW (ref 30.0–36.0)
MCV: 93.5 fL (ref 78.0–100.0)
MONO ABS: 0.8 10*3/uL (ref 0.1–1.0)
Monocytes Relative: 6 %
NEUTROS ABS: 9.7 10*3/uL — AB (ref 1.7–7.7)
NEUTROS PCT: 72 %
PLATELETS: 183 10*3/uL (ref 150–400)
RBC: 3.06 MIL/uL — ABNORMAL LOW (ref 3.87–5.11)
RDW: 36.3 % — AB (ref 11.5–15.5)
WBC: 13.5 10*3/uL — ABNORMAL HIGH (ref 4.0–10.5)

## 2017-02-06 LAB — PHOSPHORUS: Phosphorus: 8 mg/dL — ABNORMAL HIGH (ref 2.5–4.6)

## 2017-02-06 NOTE — Progress Notes (Signed)
VASCULAR LAB PRELIMINARY  PRELIMINARY  PRELIMINARY  PRELIMINARY  Bilateral lower extremity arterial duplex completed.    Preliminary report:  VASCULAR LAB PRELIMINARY RESULTS  Upper Extremity Arterial completed         WAVEFORM  WAVEFORM  SUBCLAVIAN Triphasic SUBCLAVIAN Triphasic  AXILLARY Triphasic AXILLARY Triphasic  BRACHIAL Triphasic BRACHIAL Triphasic  RADIAL Triphasic RADIAL Triphasic  ULNAR Triphasic ULNAR Biphasic   Duplex scan revealed patent arteries with normal Doppler waveforms throughout bilaterally    Annalissa Murphey, RVS 02/06/2017, 5:12 PM

## 2017-02-06 NOTE — Progress Notes (Signed)
Right  Upper Extremity Vein Map    Cephalic  Segment Diameter Depth Comment  1. Axilla 1.62mm 7.12mm Thrombosed  2. Mid upper arm 2.68mm 9.1 Thrombosed  3. Above AC 2.32mm 2.1 Thrombosed  4. In AC 73mm 2.65mm Thrombosed  5. Below AC 2.20mm 11.8 Thrombosed  6. Mid forearm 3.28mm 7.7 Thrombosed  7. Wrist 2.25mm 3.7 Thrombosed   Basilic  Segment Diameter Depth Comment  2. Mid upper arm 37mm 28.51mm Patent ,compressible  3. Above AC 1.93mm 25.33mm Patent, compressible  4. In AC mm mm Too small  5. Below AC mm mm Too small  6. Mid forearm mm mm Too small  7. Wrist mm mm Too small    [facility], [place] 02/06/2017 5:42 PM

## 2017-02-06 NOTE — Progress Notes (Signed)
Subjective: Patient was evaluated this morning.  She denies shortness of breath, chest pain or abdominal pain.  She states that she had some abdominal pain yesterday that resolved overnight.  She denies vaginal symptoms.  She reports able to urinate very little this morning.  Objective:  Vital signs in last 24 hours: Vitals:   02/05/17 1558 02/05/17 1900 02/06/17 0100 02/06/17 0447  BP: 132/61 107/63 (!) 127/57 96/85  Pulse: (!) 103 (!) 117 (!) 128 99  Resp: 16 19 (!) 27 (!) 25  Temp: 97.9 F (36.6 C) 98.4 F (36.9 C)  97.5 F (36.4 C)  TempSrc: Oral Oral  Axillary  SpO2: 96% 96% 94% 95%  Weight:   280 lb (127 kg)   Height:       Physical Exam  Constitutional: No distress.  Cardiovascular:  Irregularly Irregular  Pulmonary/Chest: Effort normal and breath sounds normal. No respiratory distress. She has no wheezes.  On anterior and lateral chest wall   Abdominal: Soft. She exhibits no distension. There is no tenderness.  Musculoskeletal: She exhibits no edema.  Skin: Skin is warm and dry.    Assessment/Plan:  Principal Problem:   Cardiogenic shock (HCC)  Acute on chronic combined systolic and diastolic HF Cardiogenic shock.  Off pressors and on midodrine. -midodrine   AKI on CKD 4 Patient has currently done well with intermittent hemodialysis and will be continued on her Monday Wednesday Friday schedule. Vascular surgery has been consulted for possible right aVF or AVG.  Patient has a tunneled dialysis catheter. 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 Patient will need to be anticoagulated during surgery and there is a question if this is safe to do given her left-sided subdural hematoma and left posterior communicating artery aneurysm. Will consult neurosurgery tomorrow. -Consult  neurosurgery tomorrow   Abdominal pain Patient had complained of lower quadrant tenderness bilaterally and a CT abdomen/pelvis without contrast was ordered.  The CT noted small left pleural effusion, with leakage of contrast from rectum that may represent retrograde filling. This can also be seen in the vagino-rectal fistula.  Patient was not given contrast with the study. She had been given oral contrast during a swallow study 3 days prior.  It is possible that ontrast may have lingered due to constipation and was leaking out when CT study was done and re-entered vagina.  This morning patient denies any abdominal pain.  Patient denies any urinary or vaginal symptoms.   Since abdominal pain has resolved and patient is not complaining of urinary or vaginal symptoms do not suspect a vaginal-rectal fistula at this time.  Will continue to monitor and if patient develops symptoms will reassess. -Monitor  Chronic Atrial Fibrillation  Patient continues to be in A-fib with rates controlled. - HF team following - PO Amiodarone - No anticoagulation for recent SDH  Acute on Chronic Anemia s/p 2 units pRBCs 4/22. Hgb stable currently. - Aranesp q Monday - CBC in the morning   Persistent shock s/p VRE bacteremia Resolved.  Repeat blood cultures on 4/19 show NGTD.  Blood pressures have been stable. - weaning stress dose steroids - on midodrine 15mg  TID - completed abx 4/18  Acute on chronic respiratory failure  Secondary toHx of COPD, OSA,  CHF.  Currently stable.   -Bipap qhs - continuous O2 on nasal cannula  Dispo: Anticipated discharge pending clinical course.  Valinda Party, DO 02/06/2017, 10:12 AM Pager: [phone]

## 2017-02-06 NOTE — Progress Notes (Signed)
   VASCULAR SURGERY ASSESSMENT & PLAN:   ESRD:  Vein map and UE arterial doppler study pending.   If medically ready, I could potentially place a right AVF/AVG on Tuesday. (I would not place access in left arm given the ischemic left index finger). Has a tunneled dialysis catheter.  LEFT SIDED SUBDURAL HEMATOMA AND LEFT POSTERIOR COMMUNICATING ARTERY ANEURYSM:   I need input from primary service here. Is it safe to proceed with surgery Tuesday? We will need to anticoagulate during surgery. Does she need input from Neurosurgery?   SUBJECTIVE:   No complaints.  PHYSICAL EXAM:   Vitals:   02/05/17 1558 02/05/17 1900 02/06/17 0100 02/06/17 0447  BP: 132/61 107/63 (!) 127/57 96/85  Pulse: (!) 103 (!) 117 (!) 128 99  Resp: 16 19 (!) 27 (!) 25  Temp: 97.9 F (36.6 C) 98.4 F (36.9 C)  97.5 F (36.4 C)  TempSrc: Oral Oral  Axillary  SpO2: 96% 96% 94% 95%  Weight:   280 lb (127 kg)   Height:       No change in exam.  LABS:   Lab Results  Component Value Date   WBC 13.5 (H) 02/06/2017   HGB 8.3 (L) 02/06/2017   HCT 28.6 (L) 02/06/2017   MCV 93.5 02/06/2017   PLT 183 02/06/2017   Lab Results  Component Value Date   CREATININE 3.81 (H) 02/06/2017   Lab Results  Component Value Date   INR 1.26 01/24/2017   CBG (last 3)   Recent Labs  02/06/17 0141 02/06/17 0444 02/06/17 0756  GLUCAP 105* 95 96    PROBLEM LIST:    Principal Problem:   Cardiogenic shock (HCC) Active Problems:   Essential hypertension   Obstructive sleep apnea   Morbid obesity ([place])   Obesity hypoventilation syndrome (HCC)   COPD mixed type (HCC)   Chronic respiratory failure with hypoxia (HCC)   Acute kidney injury superimposed on CKD ([place])   Acute on chronic combined systolic and diastolic CHF (congestive heart failure) (HCC)   Thoracic aortic atherosclerosis (HCC)   Cardiorenal syndrome   VRE bacteremia   PAH (pulmonary artery hypertension) (HCC)   Chronic atrial fibrillation  (HCC)   Left-sided nontraumatic intracerebral hemorrhage (HCC)   Encounter for central line placement   SDH (subdural hematoma) (HCC)   Hygroma   Thrombocytopenia (HCC)   DIC (disseminated intravascular coagulation) (HCC)   HIT (heparin-induced thrombocytopenia) (HCC)   Abdominal pain   CURRENT MEDS:   . amiodarone  200 mg Oral BID  . darbepoetin (ARANESP) injection - NON-DIALYSIS  150 mcg Subcutaneous Q Mon-1800  . feeding supplement (PRO-STAT SUGAR FREE 64)  30 mL Oral BID  . hydrocortisone sod succinate (SOLU-CORTEF) inj  12.5 mg Intravenous Q12H  . hydrocortisone sod succinate (SOLU-CORTEF) inj  25 mg Intravenous Q6H  . insulin aspart  2-6 Units Subcutaneous Q4H  . lanthanum  1,000 mg Oral TID WC  . mouth rinse  15 mL Mouth Rinse BID  . midodrine  15 mg Oral TID WC  . pantoprazole  40 mg Oral Daily    Gae Gallop Beeper: [phone] Office: [phone] 02/06/2017

## 2017-02-06 NOTE — Progress Notes (Addendum)
Internal Medicine Attending:   I saw and examined the patient. I reviewed the resident's note and I agree with the resident's findings and plan as documented in the resident's note. Paige Marshall is a 61 year old female who initially presented to the hospital 6 weeks prior and was admitted to my service for acute on chronic combined congestive heart failure. She progressed to cardiogenic shock and was transferred to the ICU. She was also noted to be in A. fib. Her hospital course has been very complicated. Her A. fib has returned despite 3 attempts at cardioversion. Her anticoagulation had to be discontinued due to an subdural hematoma and thus precluded additional attempts at cardioversion. Her cardiogenic shock was eventually treated with continuous renal replacement therapy and ultrafiltration in which over 100 pounds of fluid were removed. However her ICU course was complicated by vancomycin resistant enterococcus bacteremia fortunately this was sensitive to ampicillin and she has completed treatment for that. She continues to need inpatient care, however slowly improving. She was transferred out of the ICU however this is to [facility]. Yesterday they realize that we were the admitting service and asked if we would resume care which we agreed. Other acute issues is that she developed some generalized abdominal pain 1 or 2 days ago which was described as bandlike she underwent a CT of the abdomen and pelvis yesterday, there is no acute abdominal process which would explain her symptoms other than moderate constipation and stool in colon. I cannot tell if this study was made to have oral contrast however oral contrast was present, that was not the study then likely represents oral contrast from the modified barium 6 days ago. It was noted to be some contrast in the vaginal vault, however she had also had leaked contrast out of the rectum, given her symptoms have resolved with bowel movement I suspect  that her abdominal complaints were due to constipation. I have no reason to suspect she would have a rectovaginal fistula and suspect this was contrast that reentered the vagina.  Overall she is doing much better, we will be looking to transfer her to a long-term acute care facility that can support her continuing intermittent hemodialysis. However we need to see about vascular access placement. She currently has a tunnel dialysis catheter which was placed March 28. Vascular surgery is potentially willing to place access on Tuesday however they would like input to whether this is safe for her to have anticoagulation that will be required for the acess placement given she had the previous subdural hematoma while she was on anticoagulation. I do not see that [facility] consulted neurosurgery, there is no acute reason to consult them today however we will ask for their input tomorrow morning.  General: obese resting in bed, supplemetnal o2 via [place] HEENT:  EOMI, no scleral icterus Cardiac: irr irr, no rubs, murmurs or gallops Pulm: clear to ausculation of anterior lung fields Abd: soft, nontender,  BS present Ext: trace pedal edema Neuro: alert and oriented X3

## 2017-02-06 NOTE — Progress Notes (Signed)
[facility] Progress Note    Assessment/ Plan:   Anuric AKI on CKD 4: due to persistent shock. Has done very well with intermittent HD.  - Will continue with MWF HD - Vein mapping pending - VVS consulted 4/27. Concerned about ischemic left index finger to place access. Possible R AVF or AVG next wk. UE arterial doppler ordered. - Working with PT. Need to work on strength and mobility before she will be ready for outpatient dialysis.   -  Would consider LTC facility that accomodates dialysis patients.    HyperP: P elevated to 8 this morning. Calcium corrects to normal.  -Continue on Fosrenol 1g 3 times a day  Anemia: s/p 2 units pRBCs 4/22. Hgb stable. - Aranesp q Monday  Atrial Fibrillation / acute on chronic combined systolic and diastolic HF- continues to be in A-fib.  - HF team following - PO Amiodarone - No anticoagulation for recent SDH  Combined systolic and diastolic HF: Echo on 81/85/6314 with EF of 35-40%, mild LVH, eccentric hypertrophy of LV, mod dilated LA and severely dilated RV with signs volume overload and PAPP of 71.  - HF team following  Persistent shock s/p VRE bacteremia- resolved. BPs much better. - On Midodrine 15mg  tid - Weaning stress dose steroids - Completed abx 01/26/17, repeat blood cultures 4/19 NGTD  SDH: stable - Not a candidate for anticoagulation  Abdominal Pain- resolved. CT abdomen and pelvis negative except some ?contrast in gallbladder and vagina  Subjective:    No complaint this morning. Abdominal pain resolved. She is in a good mood.    Objective:   BP 96/85 (BP Location: Right Leg)   Pulse 99   Temp 97.5 F (36.4 C) (Axillary)   Resp (!) 25   Ht 5\' 8"  (1.727 m)   Wt 280 lb (127 kg)   LMP 09/07/2011   SpO2 95%   BMI 42.57 kg/m   Intake/Output Summary (Last 24 hours) at 02/06/17 0805 Last data filed at 02/06/17 0600  Gross per 24 hour  Intake              240 ml  Output                0 ml  Net               240 ml   Weight change: 15 lb (6.804 kg)  Physical Exam: Gen: lying in bed, no apparent distress CVS: Irregularly irregular rhythm, rate in the 100's Resp: CTAB, normal work of breathing Abd: obese, soft, non-tender, no rebound, no guarding Ext: 1+ pitting edema in the lower extremities bilaterally ACCESS: R TDC in place  Imaging: Ct Abdomen Pelvis Wo Contrast  Result Date: 02/05/2017 CLINICAL DATA:  Abdominal pain. EXAM: CT ABDOMEN AND PELVIS WITHOUT CONTRAST TECHNIQUE: Multidetector CT imaging of the abdomen and pelvis was performed following the standard protocol without IV contrast. COMPARISON:  CT of the abdomen pelvis 03/08/2016 FINDINGS: Lower chest: Left lung base airspace consolidation versus atelectasis with tiny left pleural effusion. Hepatobiliary: No focal liver abnormality is seen. No gallstones, gallbladder wall thickening, or biliary dilatation. Layering hyperdense material within the gallbladder seen. Pancreas: Unremarkable. No pancreatic ductal dilatation or surrounding inflammatory changes. Spleen: Normal in size without focal abnormality. Adrenals/Urinary Tract: Adrenal glands are unremarkable. Kidneys are normal, without renal calculi, focal lesion, or hydronephrosis. Bladder is unremarkable. Stomach/Bowel: Moderate amount of formed stool within the rectum. Vascular/Lymphatic: Aortic atherosclerosis. No enlarged abdominal or pelvic lymph nodes. Reproductive: Uterus  and bilateral adnexa are unremarkable. Small amount of contrast noted within the vagina. Other: No abdominal wall hernia or abnormality. No abdominopelvic ascites. Musculoskeletal: No acute osseous findings. Posterior facet arthropathy of the lower lumbosacral spine. IMPRESSION: Left lower lobe atelectasis versus airspace consolidation with tiny left pleural effusion. Hyperdense material within the gallbladder may represent a vicarious excretion of contrast. Has the patient had IV contrast administration recently?  Moderate amount of formed stool within the rectum. Small amount of contrast within the vagina. Given there is leakage of contrast from the rectum, this may represent retrograde filling. Alternatively such findings may be seen with vagino-rectal fistula. Please correlate clinically. Electronically Signed   By: Fidela Salisbury M.D.   On: 02/05/2017 14:07    Labs: BMET  Recent Labs Lab 02/01/17 0416 02/01/17 0417 02/02/17 0426 02/03/17 0402 02/04/17 0550 02/05/17 0515 02/06/17 0459 02/06/17 0502  NA 139 140 136 138  137 138 138 136  --   K 4.3 4.4 4.8 4.3  3.9 4.2 3.5 4.9  --   CL 98* 98* 96* 101  99* 99* 101 98*  --   CO2 25 25 23 25  26 24 26  20*  --   GLUCOSE 107* 107* 103* 109*  108* 90 81 88  --   BUN 67* 70* 92* 43*  42* 61* 26* 44*  --   CREATININE 2.82* 2.83* 3.98* 2.78*  2.83* 4.17* 2.54* 3.81*  --   CALCIUM 8.3* 8.2* 8.7* 8.3*  8.5* 8.7* 8.2* 8.8*  --   PHOS 4.6 4.6 7.2* 5.8*  5.9* 7.3* 4.0  --  8.0*   CBC  Recent Labs Lab 02/03/17 0402 02/04/17 0550 02/05/17 0515 02/06/17 0459  WBC 13.8* 13.8* 13.8* 13.5*  NEUTROABS 12.0* 11.5* 11.4* 9.7*  HGB 7.9* 8.0* 8.3* 8.3*  HCT 27.5* 27.3* 28.7* 28.6*  MCV 92.3 92.9 93.5 93.5  PLT 177 170 174 183    Medications:    . amiodarone  200 mg Oral BID  . darbepoetin (ARANESP) injection - NON-DIALYSIS  150 mcg Subcutaneous Q Mon-1800  . feeding supplement (PRO-STAT SUGAR FREE 64)  30 mL Oral BID  . hydrocortisone sod succinate (SOLU-CORTEF) inj  12.5 mg Intravenous Q12H  . hydrocortisone sod succinate (SOLU-CORTEF) inj  25 mg Intravenous Q6H  . insulin aspart  2-6 Units Subcutaneous Q4H  . lanthanum  1,000 mg Oral TID WC  . mouth rinse  15 mL Mouth Rinse BID  . midodrine  15 mg Oral TID WC  . pantoprazole  40 mg Oral Daily    Wendee Beavers, MD.  PGY-2 Pager [phone] 02/06/17  8:05 AM  I have seen and examined this patient and agree with plan and assessment in the above note with renal  recommendations/intervention highlighted.  Appreciate Dr. Nicole Cella evaluation and concern over SDH and vascular access surgery.  Will need to address with primary and possible neurosurgery.  She is doing much better and has tolerated HD well.  Plan for HD tomorrow and continue while she remains an inpatient.  Awaiting disposition to LTC facility.   Broadus John A Latrelle Bazar,MD 02/06/2017 1:07 PM

## 2017-02-07 ENCOUNTER — Inpatient Hospital Stay (HOSPITAL_COMMUNITY): Payer: Medicare Other

## 2017-02-07 DIAGNOSIS — Z8679 Personal history of other diseases of the circulatory system: Secondary | ICD-10-CM

## 2017-02-07 DIAGNOSIS — Z9989 Dependence on other enabling machines and devices: Secondary | ICD-10-CM

## 2017-02-07 DIAGNOSIS — R110 Nausea: Secondary | ICD-10-CM

## 2017-02-07 LAB — BASIC METABOLIC PANEL
Anion gap: 21 — ABNORMAL HIGH (ref 5–15)
BUN: 66 mg/dL — ABNORMAL HIGH (ref 6–20)
CO2: 19 mmol/L — ABNORMAL LOW (ref 22–32)
Calcium: 8.9 mg/dL (ref 8.9–10.3)
Chloride: 97 mmol/L — ABNORMAL LOW (ref 101–111)
Creatinine, Ser: 4.99 mg/dL — ABNORMAL HIGH (ref 0.44–1.00)
GFR calc Af Amer: 10 mL/min — ABNORMAL LOW (ref >60)
GFR, EST NON AFRICAN AMERICAN: 9 mL/min — AB (ref >60)
GLUCOSE: 78 mg/dL (ref 65–99)
POTASSIUM: 4.9 mmol/L (ref 3.5–5.1)
Sodium: 137 mmol/L (ref 135–145)

## 2017-02-07 LAB — GLUCOSE, CAPILLARY
GLUCOSE-CAPILLARY: 18 mg/dL — AB (ref 65–99)
GLUCOSE-CAPILLARY: 61 mg/dL — AB (ref 65–99)
GLUCOSE-CAPILLARY: 68 mg/dL (ref 65–99)
GLUCOSE-CAPILLARY: 80 mg/dL (ref 65–99)
GLUCOSE-CAPILLARY: 88 mg/dL (ref 65–99)
Glucose-Capillary: 62 mg/dL — ABNORMAL LOW (ref 65–99)
Glucose-Capillary: 67 mg/dL (ref 65–99)
Glucose-Capillary: 78 mg/dL (ref 65–99)
Glucose-Capillary: 78 mg/dL (ref 65–99)
Glucose-Capillary: 104 mg/dL — ABNORMAL HIGH (ref 65–99)
Glucose-Capillary: 59 mg/dL — ABNORMAL LOW (ref 65–99)
Glucose-Capillary: 63 mg/dL — ABNORMAL LOW (ref 65–99)

## 2017-02-07 LAB — CBC
HCT: 29.6 % — ABNORMAL LOW (ref 36.0–46.0)
Hemoglobin: 8.9 g/dL — ABNORMAL LOW (ref 12.0–15.0)
MCH: 28.1 pg (ref 26.0–34.0)
MCHC: 30.1 g/dL (ref 30.0–36.0)
MCV: 93.4 fL (ref 78.0–100.0)
PLATELETS: 188 10*3/uL (ref 150–400)
RBC: 3.17 MIL/uL — AB (ref 3.87–5.11)
RDW: 35.2 % — ABNORMAL HIGH (ref 11.5–15.5)
WBC: 18.6 10*3/uL — ABNORMAL HIGH (ref 4.0–10.5)

## 2017-02-07 LAB — HEPATITIS B SURFACE ANTIGEN: Hepatitis B Surface Ag: NEGATIVE

## 2017-02-07 MED ORDER — NEPRO/CARBSTEADY PO LIQD
237.0000 mL | Freq: Two times a day (BID) | ORAL | Status: DC
Start: 2017-02-07 — End: 2017-02-10
  Administered 2017-02-08: 237 mL via ORAL
  Filled 2017-02-07 (×9): qty 237

## 2017-02-07 MED ORDER — VANCOMYCIN HCL IN DEXTROSE 1-5 GM/200ML-% IV SOLN
1000.0000 mg | INTRAVENOUS | Status: AC
  Filled 2017-02-07: qty 200

## 2017-02-07 MED ORDER — MIDODRINE HCL 5 MG PO TABS
ORAL_TABLET | ORAL | Status: AC
  Filled 2017-02-07: qty 3

## 2017-02-07 NOTE — Progress Notes (Signed)
Hypoglycemic Event  CBG: 61  Treatment: 15 GM carbohydrate snack  Symptoms: None  Follow-up CBG: Time:1224 CBG Result:88  Possible Reasons for Event: Inadequate meal intake  Comments/MD notified:Dr Amin    Lawson Mahone C Jayston Trevino

## 2017-02-07 NOTE — Progress Notes (Signed)
Physical Therapy Treatment Patient Details Name: Paige Marshall MRN: [id number] DOB: 12-18-1955 Today's Date: 02/07/2017    History of Present Illness 61 y.o. female with morbid obesity / wheelchair bound, untreated OHS, chronic combined systolic / diastolic CHF, NICM, AF on coumadin, HTN, 3L O2 dependent, pulmonary hypertension, COPD & CKD III.  The patient suffered a left fibular fracture in late 2017 and has been at [facility] since. Admitted 12/26/16 from SNF with acute CHF. Pt started on CVVHD that was then discontinued for HD. 3/31 pt developed septic shock and CVVHD restarted.[name] CVVHD discontinued 4/9.  Restarted 4/14 and transition to HD 4/25 with discontinuation of pressors.  Had DCCV third time to NSR 4/12 then AMS and CT positive L frontal ICH and MRI noted parafalcine SDH trace R midline shift.  Reverted back to a-fib 4/16.    PT Comments    Pt making steady progress. Remains highly motivated to participate in therapy.   Follow Up Recommendations  SNF     Equipment Recommendations  Other (comment) (To be assessed)    Recommendations for Other Services       Precautions / Restrictions Precautions Precautions: Fall Precaution Comments: watch vitals Restrictions Weight Bearing Restrictions: No    Mobility  Bed Mobility Overal bed mobility: Needs Assistance Bed Mobility: Supine to Sit;Sit to Supine     Supine to sit: +2 for physical assistance;Mod assist Sit to supine: +2 for physical assistance;Mod assist   General bed mobility comments: Assist to bring legs off and elevate trunk into sitting. Assist to bring legs back up into bed  Transfers Overall transfer level: Needs assistance Equipment used: Ambulation equipment used Charlaine Dalton) Transfers: Sit to/from Stand Sit to Stand: +2 physical assistance;Max assist         General transfer comment: From EOB attempted x 4 to stand. Unable to get hips off of bed on first two attempts. On third and fourth attempt able to  bring hips off of bed 4-8 inches. Used pad underneath hips to assist.  Ambulation/Gait                 Stairs            Wheelchair Mobility    Modified Rankin (Stroke Patients Only)       Balance Overall balance assessment: Needs assistance Sitting-balance support: No upper extremity supported;Feet supported Sitting balance-Leahy Scale: Fair Sitting balance - Comments: Sat EOB x 20 minutes   Standing balance support: Bilateral upper extremity supported Standing balance-Leahy Scale: Zero Standing balance comment: Only able to achieve partial stand                            Cognition Arousal/Alertness: Awake/alert Behavior During Therapy: WFL for tasks assessed/performed Overall Cognitive Status: Within Functional Limits for tasks assessed                                        Exercises      General Comments        Pertinent Vitals/Pain Pain Assessment: Faces Faces Pain Scale: Hurts even more Pain Location: bottom "raw" Pain Descriptors / Indicators: Discomfort;Tender;Sore Pain Intervention(s): Monitored during session    Home Living                      Prior Function  PT Goals (current goals can now be found in the care plan section) Progress towards PT goals: Progressing toward goals    Frequency    Min 3X/week      PT Plan Current plan remains appropriate    Co-evaluation              AM-PAC PT "6 Clicks" Daily Activity  Outcome Measure  Difficulty turning over in bed (including adjusting bedclothes, sheets and blankets)?: Total Difficulty moving from lying on back to sitting on the side of the bed? : Total Difficulty sitting down on and standing up from a chair with arms (e.g., wheelchair, bedside commode, etc,.)?: Total Help needed moving to and from a bed to chair (including a wheelchair)?: Total Help needed walking in hospital room?: Total Help needed climbing 3-5 steps  with a railing? : Total 6 Click Score: 6    End of Session Equipment Utilized During Treatment: Gait belt;Oxygen Activity Tolerance: Patient tolerated treatment well Patient left: in bed;with call bell/phone within reach Nurse Communication: Mobility status;Need for lift equipment PT Visit Diagnosis: Muscle weakness (generalized) (M62.81);Difficulty in walking, not elsewhere classified (R26.2)     Time: 8916-9450 PT Time Calculation (min) (ACUTE ONLY): 34 min  Charges:  $Therapeutic Activity: 23-37 mins                    G Codes:       [facility] PT [place] 02/07/2017, 10:56 AM

## 2017-02-07 NOTE — Progress Notes (Signed)
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  - HD MWF- Plan for HD today. - Vein mapping has been done - VVS consulted 4/27. Possible R AVF/AVG on 5/1. Concern about using anticoagulation with recent SDH. Primary team discussed with neurosurgery- plan to repeat CT head today.    Hyperphosphatemia: Phos 8.0 yesterday. - Continue on Fosrenol 1g 3 times a day - Daily renal function panel  Anemia: s/p 2 units pRBCs 4/22. Hgb stable. - Aranesp q Monday  Atrial Fibrillation: continues to be in A-fib.  - HF team following - PO Amiodarone - No anticoagulation for recent SDH  Combined systolic and diastolic HF: Echo on 54/62/7035 with EF of 35-40%, mild LVH, eccentric hypertrophy of LV, mod dilated LA and severely dilated RV with signs volume overload and PAPP of 71.  - HF team following  Persistent shock s/p VRE bacteremia- resolved. BPs much better. - On Midodrine 15mg  tid - Weaning stress dose steroids - Completed abx 01/26/17, repeat blood cultures 4/19 NGTD  SDH: stable - Not a candidate for anticoagulation - Repeat CT head today per neurosurgery recommendations  Dispo: Needs to work on strength and mobility before she can go to outpatient HD. Could consider LTAC.  Subjective:    Doing well this morning. States her bottom hurts, but the cream is helping. No shortness of breath. Feels well.   Objective:   BP 110/72 (BP Location: Right Leg)   Pulse (!) 110   Temp 97.6 F (36.4 C) (Oral)   Resp 18   Ht 5\' 8"  (1.727 m)   Wt 272 lb 3.2 oz (123.5 kg)   LMP 09/07/2011   SpO2 100%   BMI 41.39 kg/m   Intake/Output Summary (Last 24 hours) at 02/07/17 1111 Last data filed at 02/07/17 0649  Gross per 24 hour  Intake                0 ml  Output                0 ml  Net                0 ml   Weight change: -7 lb 12.8 oz (-3.538 kg)  Physical Exam: Gen: laying in bed, in NAD CVS:  Irregularly irregular rhythm, tachycardic, no murmurs Resp: rhonchi present, improves after cough; normal work of breathing. Abd: obese, soft, non-tender, no rebound, no guarding Ext: 1+ pitting edema in the lower extremities bilaterally ACCESS: R TDC in place  Imaging: Ct Abdomen Pelvis Wo Contrast  Result Date: 02/05/2017 CLINICAL DATA:  Abdominal pain. EXAM: CT ABDOMEN AND PELVIS WITHOUT CONTRAST TECHNIQUE: Multidetector CT imaging of the abdomen and pelvis was performed following the standard protocol without IV contrast. COMPARISON:  CT of the abdomen pelvis 03/08/2016 FINDINGS: Lower chest: Left lung base airspace consolidation versus atelectasis with tiny left pleural effusion. Hepatobiliary: No focal liver abnormality is seen. No gallstones, gallbladder wall thickening, or biliary dilatation. Layering hyperdense material within the gallbladder seen. Pancreas: Unremarkable. No pancreatic ductal dilatation or surrounding inflammatory changes. Spleen: Normal in size without focal abnormality. Adrenals/Urinary Tract: Adrenal glands are unremarkable. Kidneys are normal, without renal calculi, focal lesion, or hydronephrosis. Bladder is unremarkable. Stomach/Bowel: Moderate amount of formed stool within the rectum. Vascular/Lymphatic: Aortic atherosclerosis. No enlarged abdominal or pelvic lymph nodes. Reproductive: Uterus and bilateral adnexa are unremarkable. Small amount of contrast noted within the vagina. Other:  No abdominal wall hernia or abnormality. No abdominopelvic ascites. Musculoskeletal: No acute osseous findings. Posterior facet arthropathy of the lower lumbosacral spine. IMPRESSION: Left lower lobe atelectasis versus airspace consolidation with tiny left pleural effusion. Hyperdense material within the gallbladder may represent a vicarious excretion of contrast. Has the patient had IV contrast administration recently? Moderate amount of formed stool within the rectum. Small amount of  contrast within the vagina. Given there is leakage of contrast from the rectum, this may represent retrograde filling. Alternatively such findings may be seen with vagino-rectal fistula. Please correlate clinically. Electronically Signed   By: Fidela Salisbury M.D.   On: 02/05/2017 14:07    Labs: BMET  Recent Labs Lab 02/01/17 0416 02/01/17 0417 02/02/17 0426 02/03/17 0402 02/04/17 0550 02/05/17 0515 02/06/17 0459 02/06/17 0502 02/07/17 0530  NA 139 140 136 138  137 138 138 136  --  137  K 4.3 4.4 4.8 4.3  3.9 4.2 3.5 4.9  --  4.9  CL 98* 98* 96* 101  99* 99* 101 98*  --  97*  CO2 25 25 23 25  26 24 26  20*  --  19*  GLUCOSE 107* 107* 103* 109*  108* 90 81 88  --  78  BUN 67* 70* 92* 43*  42* 61* 26* 44*  --  66*  CREATININE 2.82* 2.83* 3.98* 2.78*  2.83* 4.17* 2.54* 3.81*  --  4.99*  CALCIUM 8.3* 8.2* 8.7* 8.3*  8.5* 8.7* 8.2* 8.8*  --  8.9  PHOS 4.6 4.6 7.2* 5.8*  5.9* 7.3* 4.0  --  8.0*  --    CBC  Recent Labs Lab 02/03/17 0402 02/04/17 0550 02/05/17 0515 02/06/17 0459 02/07/17 0830  WBC 13.8* 13.8* 13.8* 13.5* 18.6*  NEUTROABS 12.0* 11.5* 11.4* 9.7*  --   HGB 7.9* 8.0* 8.3* 8.3* 8.9*  HCT 27.5* 27.3* 28.7* 28.6* 29.6*  MCV 92.3 92.9 93.5 93.5 93.4  PLT 177 170 174 183 188    Medications:    . amiodarone  200 mg Oral BID  . darbepoetin (ARANESP) injection - NON-DIALYSIS  150 mcg Subcutaneous Q Mon-1800  . feeding supplement (PRO-STAT SUGAR FREE 64)  30 mL Oral BID  . hydrocortisone sod succinate (SOLU-CORTEF) inj  12.5 mg Intravenous Q12H  . insulin aspart  2-6 Units Subcutaneous Q4H  . lanthanum  1,000 mg Oral TID WC  . mouth rinse  15 mL Mouth Rinse BID  . midodrine  15 mg Oral TID WC  . pantoprazole  40 mg Oral Daily    Hyman Bible, MD PGY-2 Family Medicine Resident 02/07/17  11:11 AM

## 2017-02-07 NOTE — Progress Notes (Signed)
   VASCULAR SURGERY ASSESSMENT & PLAN:   Vein map shows no good veins in right arm. Will proceed with right AVG tomorrow if cleared by Neurosurgery (given left sided subdural hematoma and left PCA aneurysm.  Otherwise, we can wait, as this is not urgent and she has a catheter   SUBJECTIVE:   No complaints.   PHYSICAL EXAM:   Vitals:   02/06/17 2300 02/07/17 0300 02/07/17 0732 02/07/17 1111  BP: 114/76 107/64 110/72 (!) 113/45  Pulse: (!) 113 (!) 55 (!) 110 (!) 113  Resp: (!) 21 (!) 22 18 (!) 23  Temp: 97.6 F (36.4 C) 97.4 F (36.3 C) 97.6 F (36.4 C) 97.4 F (36.3 C)  TempSrc: Oral Oral Oral Oral  SpO2: 96% 98% 100% 99%  Weight: 272 lb 3.2 oz (123.5 kg)     Height:       No change in exam  LABS:   Lab Results  Component Value Date   WBC 18.6 (H) 02/07/2017   HGB 8.9 (L) 02/07/2017   HCT 29.6 (L) 02/07/2017   MCV 93.4 02/07/2017   PLT 188 02/07/2017   Lab Results  Component Value Date   CREATININE 4.99 (H) 02/07/2017   Lab Results  Component Value Date   INR 1.26 01/24/2017   CBG (last 3)   Recent Labs  02/07/17 1118 02/07/17 1152 02/07/17 1224  GLUCAP 61* 68 88    PROBLEM LIST:    Principal Problem:   Cardiogenic shock (HCC) Active Problems:   Essential hypertension   Obstructive sleep apnea   Morbid obesity (HCC)   Obesity hypoventilation syndrome (HCC)   COPD mixed type (HCC)   Chronic respiratory failure with hypoxia (HCC)   Acute kidney injury superimposed on CKD (HCC)   Acute on chronic combined systolic and diastolic CHF (congestive heart failure) (HCC)   Thoracic aortic atherosclerosis (HCC)   Cardiorenal syndrome   VRE bacteremia   PAH (pulmonary artery hypertension) (HCC)   Chronic atrial fibrillation (HCC)   Left-sided nontraumatic intracerebral hemorrhage (HCC)   Encounter for central line placement   SDH (subdural hematoma) (HCC)   Hygroma   Thrombocytopenia (HCC)   DIC (disseminated intravascular coagulation) (HCC)   HIT  (heparin-induced thrombocytopenia) (HCC)   Abdominal pain   CURRENT MEDS:   . amiodarone  200 mg Oral BID  . darbepoetin (ARANESP) injection - NON-DIALYSIS  150 mcg Subcutaneous Q Mon-1800  . feeding supplement (PRO-STAT SUGAR FREE 64)  30 mL Oral BID  . hydrocortisone sod succinate (SOLU-CORTEF) inj  12.5 mg Intravenous Q12H  . insulin aspart  2-6 Units Subcutaneous Q4H  . lanthanum  1,000 mg Oral TID WC  . mouth rinse  15 mL Mouth Rinse BID  . midodrine  15 mg Oral TID WC  . pantoprazole  40 mg Oral Daily    Gae Gallop Beeper: [phone] Office: [phone] 02/07/2017

## 2017-02-07 NOTE — Consult Note (Signed)
Reason for Consult: Subdural hematoma Referring Physician: Dr. Boneta Lucks is an 61 y.o. female.  HPI: Patient is a 61 year old individual who has had cardiogenic shock end-stage renal disease and was found to have a small subdural hematoma and left frontal region approximately 2 weeks ago. She is been hospitalized for a prolonged period time and recently has been requiring renal dialysis. It is planned that she should undergo placement of a venous access port however she will need to be systemically anticoagulated for that. Neurologically she has remained stable throughout this hospitalization. She feels generalized weakness though. Repeat CT scan has been performed today and this demonstrates that the hematomas in the subacute phase but is now small her measuring maximum of 6 mm. No significant shift is noted.  Past Medical History:  Diagnosis Date  . Asthma   . Chronic combined systolic and diastolic CHF (congestive heart failure) ([place])   . CKD (chronic kidney disease), stage III   . COPD (chronic obstructive pulmonary disease) ([place])   . Degenerative joint disease of both lower legs   . Former smoker   . Gout    hx in "both knees" (01/14/2015)  . H. pylori infection 01/03/2014   +breath test.    . High cholesterol   . Hypertension   . Morbid obesity ([place])   . NICM (nonischemic cardiomyopathy) ([place])    a. LHC 01/2015: normal cors, EF 45-50%. b. EF 50-55% in 08/2016. c. EF 33% 01/2017  . OSA (obstructive sleep apnea)   . Persistent atrial fibrillation ([place])   . Pneumonia X 2  . Pulmonary hypertension ([place])   . Rheumatoid arthritis ([place])   . Stroke [facility])     Past Surgical History:  Procedure Laterality Date  . ANGIOGRAM/LV (CONGENITAL)  2007  . BREATH TEK H PYLORI N/A 12/31/2013   Procedure: BREATH TEK H PYLORI;  Surgeon: Gayland Curry, MD;  Location: [facility];  Service: General;  Laterality: N/A;  . CARDIOVERSION N/A 01/13/2017   Procedure: CARDIOVERSION;  Surgeon:  Jolaine Artist, MD;  Location: [facility] ENDOSCOPY;  Service: Cardiovascular;  Laterality: N/A;  . CARDIOVERSION N/A 01/20/2017   Procedure: CARDIOVERSION;  Surgeon: Larey Dresser, MD;  Location: [facility];  Service: Cardiovascular;  Laterality: N/A;  . CORNEAL TRANSPLANT Right ~ 2010  . CORONARY ANGIOPLASTY WITH STENT PLACEMENT  11/04/2005   "1"  . DOPPLER ECHOCARDIOGRAPHY     2 D   EF of 45%  . EYE SURGERY    . IR GENERIC HISTORICAL  01/06/2017   IR FLUORO GUIDE CV LINE RIGHT 01/06/2017 Corrie Mckusick, DO [facility]  . IR GENERIC HISTORICAL  01/06/2017   IR US GUIDE VASC ACCESS RIGHT 01/06/2017 Corrie Mckusick, DO [facility]  . LEFT HEART CATHETERIZATION WITH CORONARY ANGIOGRAM N/A 01/20/2015   Procedure: LEFT HEART CATHETERIZATION WITH CORONARY ANGIOGRAM;  Surgeon: Lorretta Harp, MD;  Location: [facility] CATH LAB;  Service: Cardiovascular;  Laterality: N/A;  . RIGHT HEART CATH N/A 01/07/2017   Procedure: Right Heart Cath;  Surgeon: Jolaine Artist, MD;  Location: [employer] [facility] CV LAB;  Service: Cardiovascular;  Laterality: N/A;  . sleep study  2011  . stress myocardial dipyridamole perfusion    . TEE WITHOUT CARDIOVERSION N/A 01/13/2017   Procedure: TRANSESOPHAGEAL ECHOCARDIOGRAM (TEE);  Surgeon: Jolaine Artist, MD;  Location: [place];  Service: Cardiovascular;  Laterality: N/A;  . [place]  . venous duplex ultrasound  2013    Family History  Problem  Relation Age of Onset  . Heart disease Mother   . Cancer Father     colon  . Hypertension Other     Social History:  reports that she quit smoking about 8 years ago. Her smoking use included Cigarettes. She has a 11.55 pack-year smoking history. She has never used smokeless tobacco. She reports that she does not drink alcohol or use drugs.  Allergies:  Allergies  Allergen Reactions  . Penicillins Hives, Itching, Swelling and Other (See Comments)    Tongue swelling Has patient had a PCN reaction causing immediate  rash, facial/tongue/throat swelling, SOB or lightheadedness with hypotension: Yes  Clarified with the patient her penicillin allergy. Pt has tolerated cefepime and ampicillin.    . Citrus Hives    Medications: I have reviewed the patient's current medications.  Results for orders placed or performed during the hospital encounter of 12/26/16 (from the past 48 hour(s))  Glucose, capillary     Status: Abnormal   Collection Time: 02/05/17  4:12 PM  Result Value Ref Range   Glucose-Capillary 102 (H) 65 - 99 mg/dL  Glucose, capillary     Status: Abnormal   Collection Time: 02/05/17  8:41 PM  Result Value Ref Range   Glucose-Capillary 104 (H) 65 - 99 mg/dL  Glucose, capillary     Status: Abnormal   Collection Time: 02/06/17 12:03 AM  Result Value Ref Range   Glucose-Capillary 104 (H) 65 - 99 mg/dL  Glucose, capillary     Status: Abnormal   Collection Time: 02/06/17  1:41 AM  Result Value Ref Range   Glucose-Capillary 105 (H) 65 - 99 mg/dL  Glucose, capillary     Status: None   Collection Time: 02/06/17  4:44 AM  Result Value Ref Range   Glucose-Capillary 95 65 - 99 mg/dL  CBC with Differential/Platelet     Status: Abnormal   Collection Time: 02/06/17  4:59 AM  Result Value Ref Range   WBC 13.5 (H) 4.0 - 10.5 K/uL   RBC 3.06 (L) 3.87 - 5.11 MIL/uL   Hemoglobin 8.3 (L) 12.0 - 15.0 g/dL   HCT 28.6 (L) 36.0 - 46.0 %   MCV 93.5 78.0 - 100.0 fL   MCH 27.1 26.0 - 34.0 pg   MCHC 29.0 (L) 30.0 - 36.0 g/dL   RDW 36.3 (H) 11.5 - 15.5 %   Platelets 183 150 - 400 K/uL   Neutrophils Relative % 72 %   Lymphocytes Relative 20 %   Monocytes Relative 6 %   Eosinophils Relative 0 %   Basophils Relative 2 %   Neutro Abs 9.7 (H) 1.7 - 7.7 K/uL   Lymphs Abs 2.7 0.7 - 4.0 K/uL   Monocytes Absolute 0.8 0.1 - 1.0 K/uL   Eosinophils Absolute 0.0 0.0 - 0.7 K/uL   Basophils Absolute 0.3 (H) 0.0 - 0.1 K/uL   RBC Morphology POLYCHROMASIA PRESENT     Comment: RARE NRBCs   WBC Morphology ATYPICAL  LYMPHOCYTES   Comprehensive metabolic panel     Status: Abnormal   Collection Time: 02/06/17  4:59 AM  Result Value Ref Range   Sodium 136 135 - 145 mmol/L   Potassium 4.9 3.5 - 5.1 mmol/L    Comment: DELTA CHECK NOTED   Chloride 98 (L) 101 - 111 mmol/L   CO2 20 (L) 22 - 32 mmol/L   Glucose, Bld 88 65 - 99 mg/dL   BUN 44 (H) 6 - 20 mg/dL   Creatinine, Ser 3.81 (H) 0.44 -  1.00 mg/dL    Comment: DELTA CHECK NOTED   Calcium 8.8 (L) 8.9 - 10.3 mg/dL   Total Protein 5.0 (L) 6.5 - 8.1 g/dL   Albumin 2.8 (L) 3.5 - 5.0 g/dL   AST 48 (H) 15 - 41 U/L   ALT 57 (H) 14 - 54 U/L   Alkaline Phosphatase 96 38 - 126 U/L   Total Bilirubin 3.2 (H) 0.3 - 1.2 mg/dL   GFR calc non Af Amer 12 (L) >60 mL/min   GFR calc Af Amer 14 (L) >60 mL/min    Comment: (NOTE) The eGFR has been calculated using the CKD EPI equation. This calculation has not been validated in all clinical situations. eGFR's persistently <60 mL/min signify possible Chronic Kidney Disease.    Anion gap 18 (H) 5 - 15  Phosphorus     Status: Abnormal   Collection Time: 02/06/17  5:02 AM  Result Value Ref Range   Phosphorus 8.0 (H) 2.5 - 4.6 mg/dL  Glucose, capillary     Status: None   Collection Time: 02/06/17  7:56 AM  Result Value Ref Range   Glucose-Capillary 96 65 - 99 mg/dL  Glucose, capillary     Status: Abnormal   Collection Time: 02/06/17 12:12 PM  Result Value Ref Range   Glucose-Capillary 103 (H) 65 - 99 mg/dL  Glucose, capillary     Status: None   Collection Time: 02/06/17  5:18 PM  Result Value Ref Range   Glucose-Capillary 86 65 - 99 mg/dL  Glucose, capillary     Status: None   Collection Time: 02/06/17  9:18 PM  Result Value Ref Range   Glucose-Capillary 69 65 - 99 mg/dL  Glucose, capillary     Status: Abnormal   Collection Time: 02/07/17  3:08 AM  Result Value Ref Range   Glucose-Capillary 62 (L) 65 - 99 mg/dL  Basic metabolic panel     Status: Abnormal   Collection Time: 02/07/17  5:30 AM  Result Value  Ref Range   Sodium 137 135 - 145 mmol/L   Potassium 4.9 3.5 - 5.1 mmol/L   Chloride 97 (L) 101 - 111 mmol/L   CO2 19 (L) 22 - 32 mmol/L   Glucose, Bld 78 65 - 99 mg/dL   BUN 66 (H) 6 - 20 mg/dL   Creatinine, Ser 4.99 (H) 0.44 - 1.00 mg/dL   Calcium 8.9 8.9 - 10.3 mg/dL   GFR calc non Af Amer 9 (L) >60 mL/min   GFR calc Af Amer 10 (L) >60 mL/min    Comment: (NOTE) The eGFR has been calculated using the CKD EPI equation. This calculation has not been validated in all clinical situations. eGFR's persistently <60 mL/min signify possible Chronic Kidney Disease.    Anion gap 21 (H) 5 - 15  Glucose, capillary     Status: None   Collection Time: 02/07/17  5:35 AM  Result Value Ref Range   Glucose-Capillary 67 65 - 99 mg/dL  Glucose, capillary     Status: None   Collection Time: 02/07/17  6:58 AM  Result Value Ref Range   Glucose-Capillary 78 65 - 99 mg/dL  Glucose, capillary     Status: None   Collection Time: 02/07/17  7:34 AM  Result Value Ref Range   Glucose-Capillary 78 65 - 99 mg/dL  CBC     Status: Abnormal   Collection Time: 02/07/17  8:30 AM  Result Value Ref Range   WBC 18.6 (H) 4.0 - 10.5 K/uL  Comment: WHITE COUNT CONFIRMED ON SMEAR   RBC 3.17 (L) 3.87 - 5.11 MIL/uL   Hemoglobin 8.9 (L) 12.0 - 15.0 g/dL    Comment: CONSISTENT WITH PREVIOUS RESULT   HCT 29.6 (L) 36.0 - 46.0 %   MCV 93.4 78.0 - 100.0 fL   MCH 28.1 26.0 - 34.0 pg   MCHC 30.1 30.0 - 36.0 g/dL   RDW 35.2 (H) 11.5 - 15.5 %   Platelets 188 150 - 400 K/uL    Comment: PLATELET COUNT CONFIRMED BY SMEAR  Glucose, capillary     Status: Abnormal   Collection Time: 02/07/17 11:13 AM  Result Value Ref Range   Glucose-Capillary 18 (LL) 65 - 99 mg/dL   Comment 1 Notify RN   Glucose, capillary     Status: Abnormal   Collection Time: 02/07/17 11:18 AM  Result Value Ref Range   Glucose-Capillary 61 (L) 65 - 99 mg/dL  Glucose, capillary     Status: None   Collection Time: 02/07/17 11:52 AM  Result Value Ref  Range   Glucose-Capillary 68 65 - 99 mg/dL  Glucose, capillary     Status: None   Collection Time: 02/07/17 12:24 PM  Result Value Ref Range   Glucose-Capillary 88 65 - 99 mg/dL    Ct Head Wo Contrast  Result Date: 02/07/2017 CLINICAL DATA:  Left frontal subdural hematoma, follow-up EXAM: CT HEAD WITHOUT CONTRAST TECHNIQUE: Contiguous axial images were obtained from the base of the skull through the vertex without intravenous contrast. COMPARISON:  01/21/2017 FINDINGS: Brain: 7 mm mixed attenuation left frontal subdural fluid collection is again noted slightly less dense and smaller than on prior and with no midline shift on current exam. Slight decrease in left parafalcine subdural hematoma as well. Otherwise no significant change. Pearline Cables- white matter distinction is maintained. No hydrocephalus is noted. No intra-axial mass nor large vascular territory infarction is seen. Vascular: No hyperdense vessel or unexpected calcification. Skull: Normal. Negative for fracture or focal lesion. Sinuses/Orbits: No acute finding. Other: None. IMPRESSION: Smaller subacute left frontal and left parafalcine subdural hematomas. Midline shift is no longer apparent. Electronically Signed   By: Ashley Royalty M.D.   On: 02/07/2017 13:28    Review of Systems  HENT: Negative.   Genitourinary: Negative.   Musculoskeletal: Negative.   Skin: Negative.   Neurological: Positive for weakness.       Generalized weakness in all 4 extremities  Psychiatric/Behavioral: Negative.    Blood pressure (!) 92/53, pulse (!) 107, temperature 97.7 F (36.5 C), temperature source Oral, resp. rate (!) 24, height 5' 8" (1.727 m), weight 123.5 kg (272 lb 3.2 oz), last menstrual period 09/07/2011, SpO2 97 %. Physical Exam  Constitutional: She is oriented to person, place, and time. She appears well-developed and well-nourished.  Morbidly obese  HENT:  Head: Normocephalic and atraumatic.  Eyes: Conjunctivae and EOM are normal. Pupils are  equal, round, and reactive to light.  Neck: Normal range of motion. Neck supple.  Neurological: She is alert and oriented to person, place, and time.  Moves all 4 extremities well though feels generally weak and gives submaximal effort in testing strength which is graded at 4 minus out of 5 throughout upper and lower extremities.  Skin: Skin is warm and dry.  Psychiatric: She has a normal mood and affect. Her behavior is normal. Judgment and thought content normal.    Assessment/Plan: Subacute subdural hematoma in the left frontal region. It is reasonable to give this patient anticoagulation for her  surgical procedure though I would not advise for long-term usage. The subdural hematoma can be followed clinically from here on out area she'll be requiring long-term hemodialysis. I would not advise a repeat scan for at least a month unless her clinical condition warrants it.  ELSNER,HENRY J 02/07/2017, 4:04 PM

## 2017-02-07 NOTE — Progress Notes (Signed)
Internal Medicine Attending:   I saw and examined the patient. I reviewed the resident's note and I agree with the resident's findings and plan as documented in the resident's note.  Patient complaining of nausea this morning on rounds. She denies any abdominal pain, no fevers or chills. She states her blood sugars were low overnight and they gave her orange juice and she thinks the nausea secondary to this. She was written for Zofran as needed but has not taken this yet. She also noted to have a mildly worsened leukocytosis today. She did have VRE bacteremia on this admission for which she was treated with antibiotics and had negative repeat cultures. She remains afebrile. I do not believe this is secondary to a recurrent infection. We'll recheck a CBC in morning. Would consider repeating blood cultures and starting her on antibiotics if she becomes febrile. We'll monitor closely.  Patient has had a long and complicated hospital course after being admitted for acute on chronic combined systolic and diastolic heart failure exacerbation. She is now on hemodialysis for end-stage renal disease and has a tunneled dialysis catheter placed. Vascular surgery follow-up and recommendations appreciated. Patient will need to be on blood thinners if a fistula was placed but since she had a subdural hematoma on this admission on April 13 this will need to be discussed with neurosurgery as to whether she can be placed on anticoagulation prior to the procedure. We'll discuss with neurosurgery today and follow-up recommendations.  Patient continues to remain in A. fib and has had multiple failed cardioversion attempts. We'll continue with amiodarone for now. We will hold off on anti-coagulation given recent subdural hematoma. Neurosurgery to follow-up and give recommendations as to when it would be safe to resume anticoagulation.

## 2017-02-07 NOTE — Plan of Care (Signed)
Problem: Skin Integrity: Goal: Risk for impaired skin integrity will decrease Outcome: Progressing Dr Carrie Mew butt cream & antifungal powder per [facility] consult will help improve moisture associated sores in perineal area.

## 2017-02-07 NOTE — Progress Notes (Signed)
Results for AGUEDA, HOUPT (MRN [id number]) as of 02/07/2017 12:58  Ref. Range 02/07/2017 07:34 02/07/2017 11:13 02/07/2017 11:18 02/07/2017 11:52 02/07/2017 12:24  Glucose-Capillary Latest Ref Range: 65 - 99 mg/dL 78 18 (LL) 61 (L) 68 88  Noted that blood sugars continue to be less than 100 mg/dl. Recommend discontinuing Novolog correction scale since patient is not requiring any insulin. Will continue to monitor blood sugars while in the hospital.   Harvel Ricks RN BSN CDE Diabetes Coordinator Pager: [phone]  8am-5pm

## 2017-02-07 NOTE — Progress Notes (Signed)
Nutrition Follow-up  DOCUMENTATION CODES:   Morbid obesity  INTERVENTION:    Nepro Shake po BID, each supplement provides 425 kcal and 19 grams protein  NUTRITION DIAGNOSIS:   Inadequate oral intake related to poor appetite as evidenced by meal completion < 50%.  Ongoing  GOAL:   Patient will meet greater than or equal to 90% of their needs  Unmet  MONITOR:   PO intake, Supplement acceptance, Labs, I & O's, Skin  ASSESSMENT:   61 year old woman with poorly controlled OHS, chronic combined systolic and diastolic CHF, a-fib/a-flutter on chronic anticoagulation, hypertension, COPD, CKD Stage 3. Admitted to ED from [facility] on 3/18 for  acute decompensated CHF complicated by cardiorenal syndrome. During stay developed acute on chronic kidney disease requiring CRRT, now being followed by nephrology for PRN HD. Blood culture positive for VRE bacteremia being treated with ampicillin and ceftriaxone. Transferred to step-down on 4/11. On 4/12 noted to be more lethargic and aphasic. CT Head revealed mixed density mass like area along the surface of the anterior frontal lobe with cytotoxic edema as well as a rounded 2 cm hemorrhage.  Unable to speak with patient today as she is currently in HD. Plans for R AVG placement tomorrow by VVS. Patient is no longer receiving TF. Diet has been advanced to renal/CHO modified. Patient is consuming </= 50% of meals. Will add PO supplements to maximize intake. Receiving Fosrenol. Labs reviewed.  Diet Order:  Diet renal/carb modified with fluid restriction Diet-HS Snack? Nothing; Room service appropriate? Yes; Fluid consistency: Thin; Fluid restriction: 1200 mL Fluid Diet NPO time specified Except for: Sips with Meds  Skin:  Wound (see comment) (Bilat inner groin areas, upper anterior and posterior thighs, and bilat buttocks with patchy areas of partial thickness skin loss)  Last BM:  4/30  Height:   Ht Readings from Last 1 Encounters:  01/21/17  5\' 8"  (1.727 m)    Weight:   Wt Readings from Last 1 Encounters:  02/06/17 272 lb 3.2 oz (123.5 kg)    Ideal Body Weight:  63.6 kg  BMI:  Body mass index is 41.39 kg/m.  Estimated Nutritional Needs:   Kcal:  1900-2100  Protein:  120-130 gm  Fluid:  per MD  EDUCATION NEEDS:   No education needs identified at this time  Molli Barrows, [place], [place], [place] Pager [phone] After Hours Pager [phone]

## 2017-02-07 NOTE — Progress Notes (Signed)
   Subjective: Patient was evaluated on rounds.  Patient reports nausea this morning.  She denies shortness of breath or chest pain.  Objective:  Vital signs in last 24 hours: Vitals:   02/06/17 2100 02/06/17 2300 02/07/17 0300 02/07/17 0732  BP: 112/73 114/76 107/64 110/72  Pulse: (!) 106 (!) 113 (!) 55 (!) 110  Resp: (!) 23 (!) 21 (!) 22 18  Temp: 97.5 F (36.4 C) 97.6 F (36.4 C) 97.4 F (36.3 C) 97.6 F (36.4 C)  TempSrc: Oral Oral Oral Oral  SpO2: 95% 96% 98% 100%  Weight:  272 lb 3.2 oz (123.5 kg)    Height:       Physical Exam  Cardiovascular:  Irregularly Irregular   Pulmonary/Chest: Effort normal.  Diffuse Rhonci   Abdominal: Soft. She exhibits no distension. There is no tenderness.  Neurological: She is alert.  Skin: Skin is warm and dry.  Psychiatric: Mood and affect normal.    Assessment/Plan:  Principal Problem:   Cardiogenic shock (HCC)  Acute on chronic combined systolic and diastolic HF Cardiogenic shock.  Off pressors and on midodrine. -midodrine   AKI on CKD 4 Patient has currently done well with intermittent hemodialysis and will be continued on her Monday Wednesday Friday schedule. Vascular surgery has been consulted for possible right aVF or AVG.  Patient has a tunneled dialysis catheter. 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   Discussed case with neurosurgery.  Will get a repeat CT head now to reassess subdural hematoma.  Pending results will likely be able to have anticoagulation during surgery to have fistula done.   - CT head   - appreciate neurosurgery's recommendations    Abdominal pain Resolved -Monitor  Chronic Atrial  Fibrillation Patient continues to be in A-fib with rates controlled. - HF team following - PO Amiodarone - No anticoagulation for recent SDH  Acute on Chronic Anemia s/p 2 units pRBCs 4/22. Hgb stable currently. - Aranesp q Monday - CBC in the morning   Persistent shock s/p VRE bacteremia Resolved.  Repeat blood cultures on 4/19 show NGTD.  Blood pressures have been stable. - weaning stress dose steroids - on midodrine 15mg  TID - completed abx 4/18  Acute on chronic respiratory failure  Secondary toHx of COPD, OSA,  CHF.  Currently stable.   -Bipap qhs - continuous O2 on nasal cannula  Dispo: Anticipated discharge pending clinical course.    Paige Party, DO 02/07/2017, 9:24 AM Pager: [phone]

## 2017-02-08 ENCOUNTER — Encounter (HOSPITAL_COMMUNITY): Payer: Self-pay | Admitting: Certified Registered Nurse Anesthetist

## 2017-02-08 ENCOUNTER — Inpatient Hospital Stay (HOSPITAL_COMMUNITY): Payer: Medicare Other | Admitting: Certified Registered Nurse Anesthetist

## 2017-02-08 ENCOUNTER — Encounter (HOSPITAL_COMMUNITY)
Admission: EM | Disposition: A | Discharge status: Nursing Home (Includes ICF) | Payer: Self-pay | Source: Home / Self Care | Attending: Pulmonary Disease

## 2017-02-08 DIAGNOSIS — N185 Chronic kidney disease, stage 5: Secondary | ICD-10-CM

## 2017-02-08 HISTORY — PX: AV FISTULA PLACEMENT: SHX1204

## 2017-02-08 LAB — BASIC METABOLIC PANEL
ANION GAP: 11 (ref 5–15)
BUN: 26 mg/dL — ABNORMAL HIGH (ref 6–20)
CO2: 26 mmol/L (ref 22–32)
Calcium: 8.4 mg/dL — ABNORMAL LOW (ref 8.9–10.3)
Chloride: 102 mmol/L (ref 101–111)
Creatinine, Ser: 3.06 mg/dL — ABNORMAL HIGH (ref 0.44–1.00)
GFR, EST AFRICAN AMERICAN: 18 mL/min — AB (ref >60)
GFR, EST NON AFRICAN AMERICAN: 16 mL/min — AB (ref >60)
Glucose, Bld: 79 mg/dL (ref 65–99)
POTASSIUM: 3.5 mmol/L (ref 3.5–5.1)
SODIUM: 139 mmol/L (ref 135–145)

## 2017-02-08 LAB — CBC
HCT: 30.4 % — ABNORMAL LOW (ref 36.0–46.0)
Hemoglobin: 9.1 g/dL — ABNORMAL LOW (ref 12.0–15.0)
MCH: 28.4 pg (ref 26.0–34.0)
MCHC: 29.9 g/dL — ABNORMAL LOW (ref 30.0–36.0)
MCV: 95 fL (ref 78.0–100.0)
PLATELETS: 185 10*3/uL (ref 150–400)
RBC: 3.2 MIL/uL — AB (ref 3.87–5.11)
RDW: 34.8 % — ABNORMAL HIGH (ref 11.5–15.5)
WBC: 18.6 10*3/uL — AB (ref 4.0–10.5)

## 2017-02-08 LAB — SURGICAL PCR SCREEN
MRSA, PCR: NEGATIVE
Staphylococcus aureus: NEGATIVE

## 2017-02-08 LAB — GLUCOSE, CAPILLARY
GLUCOSE-CAPILLARY: 110 mg/dL — AB (ref 65–99)
GLUCOSE-CAPILLARY: 76 mg/dL (ref 65–99)
Glucose-Capillary: 80 mg/dL (ref 65–99)
Glucose-Capillary: 82 mg/dL (ref 65–99)
Glucose-Capillary: 82 mg/dL (ref 65–99)
Glucose-Capillary: 108 mg/dL — ABNORMAL HIGH (ref 65–99)
Glucose-Capillary: 86 mg/dL (ref 65–99)

## 2017-02-08 SURGERY — ARTERIOVENOUS (AV) FISTULA CREATION
Anesthesia: Monitor Anesthesia Care | Site: Arm Upper | Laterality: Right

## 2017-02-08 MED ORDER — PROPOFOL 10 MG/ML IV BOLUS
INTRAVENOUS | Status: AC
  Filled 2017-02-08: qty 20

## 2017-02-08 MED ORDER — HEPARIN SODIUM (PORCINE) 1000 UNIT/ML IJ SOLN
INTRAMUSCULAR | Status: DC | PRN
  Administered 2017-02-08: 9000 [IU] via INTRAVENOUS

## 2017-02-08 MED ORDER — MIDAZOLAM HCL 2 MG/2ML IJ SOLN
INTRAMUSCULAR | Status: AC
  Filled 2017-02-08: qty 2

## 2017-02-08 MED ORDER — LIDOCAINE HCL 1 % IJ SOLN
INTRAMUSCULAR | Status: AC
  Filled 2017-02-08: qty 40

## 2017-02-08 MED ORDER — MIDAZOLAM HCL 5 MG/5ML IJ SOLN
INTRAMUSCULAR | Status: DC | PRN
  Administered 2017-02-08: 1 mg via INTRAVENOUS

## 2017-02-08 MED ORDER — SODIUM CHLORIDE 0.9 % IV SOLN
INTRAVENOUS | Status: DC | PRN
  Administered 2017-02-08: 10:00:00 500 mL

## 2017-02-08 MED ORDER — PROPOFOL 500 MG/50ML IV EMUL
INTRAVENOUS | Status: DC | PRN
  Administered 2017-02-08: 60 ug/kg/min via INTRAVENOUS

## 2017-02-08 MED ORDER — ACETAMINOPHEN 325 MG PO TABS: 325.0000 mg | ORAL_TABLET | ORAL | Status: DC | PRN

## 2017-02-08 MED ORDER — THROMBIN 20000 UNITS EX SOLR
CUTANEOUS | Status: AC
  Filled 2017-02-08: qty 20000

## 2017-02-08 MED ORDER — SODIUM CHLORIDE 0.9 % IV SOLN
INTRAVENOUS | Status: DC
  Administered 2017-02-08 – 2017-02-10 (×2): via INTRAVENOUS

## 2017-02-08 MED ORDER — SODIUM CHLORIDE 0.9 % IV SOLN
INTRAVENOUS | Status: DC | PRN
  Administered 2017-02-08: 1000 mg via INTRAVENOUS

## 2017-02-08 MED ORDER — PROTAMINE SULFATE 10 MG/ML IV SOLN
INTRAVENOUS | Status: DC | PRN
  Administered 2017-02-08: 40 mg via INTRAVENOUS

## 2017-02-08 MED ORDER — AMIODARONE HCL 200 MG PO TABS
200.0000 mg | ORAL_TABLET | Freq: Once | ORAL | Status: AC
  Administered 2017-02-08: 200 mg via ORAL
  Filled 2017-02-08: qty 1

## 2017-02-08 MED ORDER — HEPARIN SODIUM (PORCINE) 5000 UNIT/ML IJ SOLN
5000.0000 [IU] | Freq: Three times a day (TID) | INTRAMUSCULAR | Status: DC
  Administered 2017-02-08 – 2017-02-10 (×6): 5000 [IU] via SUBCUTANEOUS
  Filled 2017-02-08 (×7): qty 1

## 2017-02-08 MED ORDER — ONDANSETRON HCL 4 MG/2ML IJ SOLN: 4.0000 mg | Freq: Once | INTRAMUSCULAR | Status: DC | PRN

## 2017-02-08 MED ORDER — OXYCODONE HCL 5 MG/5ML PO SOLN: 5.0000 mg | Freq: Once | ORAL | Status: DC | PRN

## 2017-02-08 MED ORDER — PHENYLEPHRINE HCL 10 MG/ML IJ SOLN
INTRAMUSCULAR | Status: DC | PRN
  Administered 2017-02-08 (×2): 80 ug via INTRAVENOUS

## 2017-02-08 MED ORDER — OXYCODONE HCL 5 MG PO TABS: 5.0000 mg | ORAL_TABLET | Freq: Once | ORAL | Status: DC | PRN

## 2017-02-08 MED ORDER — LIDOCAINE HCL (PF) 1 % IJ SOLN
INTRAMUSCULAR | Status: DC | PRN
  Administered 2017-02-08: 48 mL via SUBCUTANEOUS

## 2017-02-08 MED ORDER — ACETAMINOPHEN 160 MG/5ML PO SOLN: 325.0000 mg | ORAL | Status: DC | PRN

## 2017-02-08 MED ORDER — LIDOCAINE HCL 1 % IJ SOLN
INTRAMUSCULAR | Status: AC
  Filled 2017-02-08: qty 20

## 2017-02-08 MED ORDER — FENTANYL CITRATE (PF) 100 MCG/2ML IJ SOLN: 25.0000 ug | INTRAMUSCULAR | Status: DC | PRN

## 2017-02-08 MED ORDER — PROPOFOL 10 MG/ML IV BOLUS
INTRAVENOUS | Status: DC | PRN
  Administered 2017-02-08: 20 mg via INTRAVENOUS

## 2017-02-08 MED ORDER — 0.9 % SODIUM CHLORIDE (POUR BTL) OPTIME
TOPICAL | Status: DC | PRN
  Administered 2017-02-08: 1000 mL

## 2017-02-08 MED ORDER — FENTANYL CITRATE (PF) 250 MCG/5ML IJ SOLN
INTRAMUSCULAR | Status: AC
  Filled 2017-02-08: qty 5

## 2017-02-08 MED ORDER — FENTANYL CITRATE (PF) 100 MCG/2ML IJ SOLN
INTRAMUSCULAR | Status: DC | PRN
  Administered 2017-02-08: 25 ug via INTRAVENOUS

## 2017-02-08 MED ORDER — PHENYLEPHRINE HCL 10 MG/ML IJ SOLN
INTRAMUSCULAR | Status: DC | PRN
  Administered 2017-02-08: 30 ug/min via INTRAVENOUS

## 2017-02-08 MED ORDER — HEPARIN SODIUM (PORCINE) 1000 UNIT/ML IJ SOLN
INTRAMUSCULAR | Status: AC
  Filled 2017-02-08: qty 1

## 2017-02-08 MED ORDER — LACTATED RINGERS IV SOLN
INTRAVENOUS | Status: DC
  Administered 2017-02-08: 15:00:00 via INTRAVENOUS

## 2017-02-08 MED ORDER — PROTAMINE SULFATE 10 MG/ML IV SOLN
INTRAVENOUS | Status: AC
  Filled 2017-02-08: qty 5

## 2017-02-08 MED ORDER — AMIODARONE HCL 200 MG PO TABS
400.0000 mg | ORAL_TABLET | Freq: Two times a day (BID) | ORAL | Status: DC
  Administered 2017-02-08 – 2017-02-10 (×4): 400 mg via ORAL
  Filled 2017-02-08 (×4): qty 2

## 2017-02-08 MED ORDER — OXYCODONE-ACETAMINOPHEN 5-325 MG PO TABS: 1.0000 | ORAL_TABLET | ORAL | Status: DC | PRN

## 2017-02-08 SURGICAL SUPPLY — 29 items
ARMBAND PINK RESTRICT EXTREMIT (MISCELLANEOUS) ×3 IMPLANT
CANISTER SUCT 3000ML PPV (MISCELLANEOUS) ×3 IMPLANT
CANNULA VESSEL 3MM 2 BLNT TIP (CANNULA) ×3 IMPLANT
CLIP TI MEDIUM 6 (CLIP) ×3 IMPLANT
CLIP TI WIDE RED SMALL 6 (CLIP) ×6 IMPLANT
COVER PROBE W GEL 5X96 (DRAPES) IMPLANT
DECANTER SPIKE VIAL GLASS SM (MISCELLANEOUS) ×3 IMPLANT
DERMABOND ADVANCED (GAUZE/BANDAGES/DRESSINGS) ×2
DERMABOND ADVANCED .7 DNX12 (GAUZE/BANDAGES/DRESSINGS) ×1 IMPLANT
ELECT REM PT RETURN 9FT ADLT (ELECTROSURGICAL) ×3
ELECTRODE REM PT RTRN 9FT ADLT (ELECTROSURGICAL) ×1 IMPLANT
GLOVE BIO SURGEON STRL SZ7.5 (GLOVE) ×3 IMPLANT
GLOVE BIOGEL PI IND STRL 8 (GLOVE) ×1 IMPLANT
GLOVE BIOGEL PI INDICATOR 8 (GLOVE) ×2
GOWN STRL REUS W/ TWL LRG LVL3 (GOWN DISPOSABLE) ×4 IMPLANT
GOWN STRL REUS W/TWL LRG LVL3 (GOWN DISPOSABLE) ×8
GRAFT GORETEX STRT 4-7X45 (Vascular Products) ×3 IMPLANT
KIT BASIN OR (CUSTOM PROCEDURE TRAY) ×3 IMPLANT
KIT ROOM TURNOVER OR (KITS) ×3 IMPLANT
NS IRRIG 1000ML POUR BTL (IV SOLUTION) ×3 IMPLANT
PACK CV ACCESS (CUSTOM PROCEDURE TRAY) ×3 IMPLANT
PAD ARMBOARD 7.5X6 YLW CONV (MISCELLANEOUS) ×6 IMPLANT
SPONGE SURGIFOAM ABS GEL 100 (HEMOSTASIS) IMPLANT
SUT PROLENE 6 0 BV (SUTURE) ×9 IMPLANT
SUT VIC AB 3-0 SH 27 (SUTURE) ×4
SUT VIC AB 3-0 SH 27X BRD (SUTURE) ×2 IMPLANT
SUT VICRYL 4-0 PS2 18IN ABS (SUTURE) ×6 IMPLANT
UNDERPAD 30X30 (UNDERPADS AND DIAPERS) ×3 IMPLANT
WATER STERILE IRR 1000ML POUR (IV SOLUTION) ×3 IMPLANT

## 2017-02-08 NOTE — Progress Notes (Signed)
[facility] Progress Note    Assessment/ Plan:   Anuric AKI on CKD 4, now likely ESRD: due to persistent shock.  - on MWF HD - s/p AVG placement VVS Scot Dock 02/08/17  Hyperphosphatemia: Fosrenol 1g 3 times a day  Anemia: s/p 2 units pRBCs 4/22. Hgb stable. - Aranesp q Monday  Atrial Fibrillation: continues to be in A-fib.  - HF team following - PO Amiodarone - No anticoagulation for recent SDH  Combined systolic and diastolic HF: Echo on 53/97/6734 with EF of 35-40%, mild LVH, eccentric hypertrophy of LV, mod dilated LA and severely dilated RV with signs volume overload and PAPP of 71.  - HF team following  Persistent shock s/p VRE bacteremia- resolved. BPs much better. - On Midodrine 15mg  tid - Weaning stress dose steroids - Completed abx 01/26/17, repeat blood cultures 4/19 NGTD  SDH: stable - Neurosurgery note reviewed - Avoid heparin with HD  Subjective:    HD yesterday, 2L UF, 2K bath AVG placed this AM    Objective:   BP (!) 144/104 (BP Location: Right Leg)   Pulse (!) 131   Temp 97.5 F (36.4 C)   Resp (!) 28   Ht 5\' 8"  (1.727 m)   Wt 120.7 kg (266 lb)   LMP 09/07/2011   SpO2 100%   BMI 40.45 kg/m   Intake/Output Summary (Last 24 hours) at 02/08/17 1328 Last data filed at 02/08/17 1131  Gross per 24 hour  Intake              300 ml  Output             2020 ml  Net            -1720 ml   Weight change: -2.812 kg (-6 lb 3.2 oz)  Physical Exam: Gen: laying in bed, in NAD CVS: Irregularly irregular rhythm, tachycardic, no murmurs Resp: rhonchi present, improves after cough; normal work of breathing. Abd: obese, soft, non-tender, no rebound, no guarding Ext: 1+ pitting edema in the lower extremities bilaterally ACCESS: R TDC in place  Imaging: Ct Head Wo Contrast  Result Date: 02/07/2017 CLINICAL DATA:  Left frontal subdural hematoma, follow-up EXAM: CT HEAD WITHOUT CONTRAST TECHNIQUE: Contiguous axial images were obtained from the  base of the skull through the vertex without intravenous contrast. COMPARISON:  01/21/2017 FINDINGS: Brain: 7 mm mixed attenuation left frontal subdural fluid collection is again noted slightly less dense and smaller than on prior and with no midline shift on current exam. Slight decrease in left parafalcine subdural hematoma as well. Otherwise no significant change. Pearline Cables- white matter distinction is maintained. No hydrocephalus is noted. No intra-axial mass nor large vascular territory infarction is seen. Vascular: No hyperdense vessel or unexpected calcification. Skull: Normal. Negative for fracture or focal lesion. Sinuses/Orbits: No acute finding. Other: None. IMPRESSION: Smaller subacute left frontal and left parafalcine subdural hematomas. Midline shift is no longer apparent. Electronically Signed   By: Ashley Royalty M.D.   On: 02/07/2017 13:28    Labs: BMET  Recent Labs Lab 02/02/17 0426 02/03/17 0402 02/04/17 0550 02/05/17 0515 02/06/17 0459 02/06/17 0502 02/07/17 0530 02/08/17 0425  NA 136 138  137 138 138 136  --  137 139  K 4.8 4.3  3.9 4.2 3.5 4.9  --  4.9 3.5  CL 96* 101  99* 99* 101 98*  --  97* 102  CO2 23 25  26 24 26  20*  --  19* 26  GLUCOSE  103* 109*  108* 90 81 88  --  78 79  BUN 92* 43*  42* 61* 26* 44*  --  66* 26*  CREATININE 3.98* 2.78*  2.83* 4.17* 2.54* 3.81*  --  4.99* 3.06*  CALCIUM 8.7* 8.3*  8.5* 8.7* 8.2* 8.8*  --  8.9 8.4*  PHOS 7.2* 5.8*  5.9* 7.3* 4.0  --  8.0*  --   --    CBC  Recent Labs Lab 02/03/17 0402 02/04/17 0550 02/05/17 0515 02/06/17 0459 02/07/17 0830 02/08/17 0425  WBC 13.8* 13.8* 13.8* 13.5* 18.6* 18.6*  NEUTROABS 12.0* 11.5* 11.4* 9.7*  --   --   HGB 7.9* 8.0* 8.3* 8.3* 8.9* 9.1*  HCT 27.5* 27.3* 28.7* 28.6* 29.6* 30.4*  MCV 92.3 92.9 93.5 93.5 93.4 95.0  PLT 177 170 174 183 188 185    Medications:    . amiodarone  200 mg Oral BID  . darbepoetin (ARANESP) injection - NON-DIALYSIS  150 mcg Subcutaneous Q Mon-1800  .  feeding supplement (NEPRO CARB STEADY)  237 mL Oral BID BM  . hydrocortisone sod succinate (SOLU-CORTEF) inj  12.5 mg Intravenous Q12H  . insulin aspart  2-6 Units Subcutaneous Q4H  . lanthanum  1,000 mg Oral TID WC  . mouth rinse  15 mL Mouth Rinse BID  . midodrine  15 mg Oral TID WC  . pantoprazole  40 mg Oral Daily    Hyman Bible, MD PGY-2 Family Medicine Resident 02/08/17  1:28 PM

## 2017-02-08 NOTE — Progress Notes (Addendum)
Report called to [place], Therapist, sports.  Pre-op check list done.  CHG bath completed.  Surgical PCR screen completed.  AM meds administered without difficulty.  Informed consent signed and placed on chart.  Pt off unit to OR @ approx 8:47am.

## 2017-02-08 NOTE — Progress Notes (Signed)
Pt gone for procedure. Will check on pt later in day.  Paige Marshall, [place]

## 2017-02-08 NOTE — Anesthesia Preprocedure Evaluation (Addendum)
Anesthesia Evaluation  Patient identified by MRN, date of birth, ID band Patient awake    Reviewed: Allergy & Precautions, NPO status , Patient's Chart, lab work & pertinent test results, reviewed documented beta blocker date and time   Airway Mallampati: II  TM Distance: >3 FB Neck ROM: Full    Dental  (+) Poor Dentition, Missing   Pulmonary former smoker,    Pulmonary exam normal        Cardiovascular hypertension, Pt. on medications and Pt. on home beta blockers Normal cardiovascular exam     Neuro/Psych    GI/Hepatic negative GI ROS, Neg liver ROS,   Endo/Other  Morbid obesity  Renal/GU   negative genitourinary   Musculoskeletal   Abdominal (+) + obese,   Peds  Hematology   Anesthesia Other Findings ECHOCARDIOGRAM COMPLETE  Order# [id number]  Reading physician: Sanda Klein, MD Ordering physician: Truman Hayward, MD Study date: 01/11/17 Study Result   Result status: Final result                             *[facility]*                         [address], [place] [address]                            [phone]  ------------------------------------------------------------------- Transthoracic Echocardiography  Patient:    Paige Marshall, Paige Marshall MR #:       [id number] Study Date: 01/11/2017 Gender:     F Age:        61 Height:     171.5 cm Weight:     137.9 kg BSA:        2.64 m^2 Pt. Status: Room:       2H11C   [place], [place] N  SONOGRAPHER  Donata Clay  ATTENDING    Elnora Morrison M  ADMITTING    Lucious Groves  PERFORMING   [facility], Inpatient  cc:  ------------------------------------------------------------------- LV EF: 35% -   40%  ------------------------------------------------------------------- Indications:      Bacteremia  790.7.  ------------------------------------------------------------------- History:   PMH:  Acute on chronic diastolic heart failure. PAF. Acute on CKD STAGE IV. Obesity hypoventilation syndrome. Hyperkalemia. PAH with acute RV failure/cor pulmonale. OSA. Respiratory failure. Anemia. NSVT.  ------------------------------------------------------------------- Study Conclusions  - Left ventricle: The cavity size was mildly dilated. Wall   thickness was increased in a pattern of mild LVH. Left   ventricular geometry showed evidence of eccentric hypertrophy.   Systolic function was moderately reduced. The estimated ejection   fraction was in the range of 35% to 40%. Moderate diffuse   hypokinesis with no identifiable regional variations. Acoustic   contrast opacification revealed no evidence ofthrombus. - Ventricular septum: The contour showed diastolic flattening.   These changes are consistent with RV volume overload. - Aortic valve: There was mild to moderate regurgitation directed   centrally in the LVOT. - Mitral valve: There was mild to moderate regurgitation directed  eccentrically and posteriorly. - Left atrium: The atrium was moderately dilated    Reproductive/Obstetrics                           Anesthesia Physical Anesthesia Plan  ASA: III  Anesthesia Plan: MAC   Post-op Pain Management:    Induction: Intravenous  Airway Management Planned: Natural Airway and Simple Face Mask  Additional Equipment:   Intra-op Plan:   Post-operative Plan:   Informed Consent: I have reviewed the patients History and Physical, chart, labs and discussed the procedure including the risks, benefits and alternatives for the proposed anesthesia with the patient or authorized representative who has indicated his/her understanding and acceptance.   Dental advisory given  Plan Discussed with: CRNA, Surgeon and Anesthesiologist  Anesthesia Plan Comments:         Anesthesia Quick Evaluation

## 2017-02-08 NOTE — Interval H&P Note (Signed)
History and Physical Interval Note:  02/08/2017 8:55 AM  Paige Marshall  has presented today for surgery, with the diagnosis of End stage renal disease N18.6  The various methods of treatment have been discussed with the patient and family. After consideration of risks, benefits and other options for treatment, the patient has consented to  Procedure(s): ARTERIOVENOUS (AV) FISTULA CREATION VERUS GRAFT INSERTION (Right) as a surgical intervention .  The patient's history has been reviewed, patient examined, no change in status, stable for surgery.  I have reviewed the patient's chart and labs.  Questions were answered to the patient's satisfaction.     [name]

## 2017-02-08 NOTE — Progress Notes (Signed)
Dr. Benjamine Mola paged; call back received.  Notified MD of low BP's since getting pt back into bed approx 1 hour ago s/p large BM; pt asymptomatic.  Current BP-88/52 with pt resting.  Also informed MD pt missed mid-day dose of Midodrine due to being off unit in OR & this RN administering 1700 dose now.  No new orders received.  MD stated he would be coming by to see pt.  Will continue to monitor. .   Of note, inquired with MD about triple lumen LIJ removal.  Per MD, will be removed prior to discharge likely in the next day or two.

## 2017-02-08 NOTE — H&P (View-Only) (Signed)
   VASCULAR SURGERY ASSESSMENT & PLAN:   Vein map shows no good veins in right arm. Will proceed with right AVG tomorrow if cleared by Neurosurgery (given left sided subdural hematoma and left PCA aneurysm.  Otherwise, we can wait, as this is not urgent and she has a catheter   SUBJECTIVE:   No complaints.   PHYSICAL EXAM:   Vitals:   02/06/17 2300 02/07/17 0300 02/07/17 0732 02/07/17 1111  BP: 114/76 107/64 110/72 (!) 113/45  Pulse: (!) 113 (!) 55 (!) 110 (!) 113  Resp: (!) 21 (!) 22 18 (!) 23  Temp: 97.6 F (36.4 C) 97.4 F (36.3 C) 97.6 F (36.4 C) 97.4 F (36.3 C)  TempSrc: Oral Oral Oral Oral  SpO2: 96% 98% 100% 99%  Weight: 272 lb 3.2 oz (123.5 kg)     Height:       No change in exam  LABS:   Lab Results  Component Value Date   WBC 18.6 (H) 02/07/2017   HGB 8.9 (L) 02/07/2017   HCT 29.6 (L) 02/07/2017   MCV 93.4 02/07/2017   PLT 188 02/07/2017   Lab Results  Component Value Date   CREATININE 4.99 (H) 02/07/2017   Lab Results  Component Value Date   INR 1.26 01/24/2017   CBG (last 3)   Recent Labs  02/07/17 1118 02/07/17 1152 02/07/17 1224  GLUCAP 61* 68 88    PROBLEM LIST:    Principal Problem:   Cardiogenic shock (HCC) Active Problems:   Essential hypertension   Obstructive sleep apnea   Morbid obesity (HCC)   Obesity hypoventilation syndrome (HCC)   COPD mixed type (HCC)   Chronic respiratory failure with hypoxia (HCC)   Acute kidney injury superimposed on CKD (HCC)   Acute on chronic combined systolic and diastolic CHF (congestive heart failure) (HCC)   Thoracic aortic atherosclerosis (HCC)   Cardiorenal syndrome   VRE bacteremia   PAH (pulmonary artery hypertension) (HCC)   Chronic atrial fibrillation (HCC)   Left-sided nontraumatic intracerebral hemorrhage (HCC)   Encounter for central line placement   SDH (subdural hematoma) (HCC)   Hygroma   Thrombocytopenia (HCC)   DIC (disseminated intravascular coagulation) (HCC)   HIT  (heparin-induced thrombocytopenia) (HCC)   Abdominal pain   CURRENT MEDS:   . amiodarone  200 mg Oral BID  . darbepoetin (ARANESP) injection - NON-DIALYSIS  150 mcg Subcutaneous Q Mon-1800  . feeding supplement (PRO-STAT SUGAR FREE 64)  30 mL Oral BID  . hydrocortisone sod succinate (SOLU-CORTEF) inj  12.5 mg Intravenous Q12H  . insulin aspart  2-6 Units Subcutaneous Q4H  . lanthanum  1,000 mg Oral TID WC  . mouth rinse  15 mL Mouth Rinse BID  . midodrine  15 mg Oral TID WC  . pantoprazole  40 mg Oral Daily    Gae Gallop Beeper: [phone] Office: [phone] 02/07/2017

## 2017-02-08 NOTE — Progress Notes (Signed)
Arrived back to unit from PACU in stable condition.  VSS.  Telephone report received prior to arrival.  Right lower arm AV graft site clean, dry, intact.  +2 right radial pulse.  Other assessment findings as charted (see flowsheet).  Resting in no distress @ this time.  Denies any pain or discomfort.  Will continue to monitor.

## 2017-02-08 NOTE — Progress Notes (Signed)
Internal Medicine Attending:   I saw and examined the patient. I reviewed the resident's note and I agree with the resident's findings and plan as documented in the resident's note.  Doing ok postoperatively, she is awake and alert with no complaints. Significant deconditioning from this prolonged hospitalization. We noted mild hypotension post operatively, will give usual dose of midodrine and may need to briefly augment steroids given the surgery today and her adrenal insufficiency.

## 2017-02-08 NOTE — Anesthesia Postprocedure Evaluation (Signed)
Anesthesia Post Note  Patient: Paige Marshall  Procedure(s) Performed: Procedure(s) (LRB): ARTERIOVENOUS (AV) GORTEX GRAFT INSERTION RIGHT LOWER ARM (Right)  Patient location during evaluation: PACU Anesthesia Type: MAC Level of consciousness: awake Pain management: pain level controlled Vital Signs Assessment: post-procedure vital signs reviewed and stable Respiratory status: spontaneous breathing Cardiovascular status: stable Postop Assessment: no signs of nausea or vomiting Anesthetic complications: no        Last Vitals:  Vitals:   02/08/17 1210 02/08/17 1215  BP: (!) 144/104   Pulse: 78 (!) 131  Resp: (!) 27 (!) 28  Temp:      Last Pain:  Vitals:   02/08/17 1210  TempSrc:   PainSc: 0-No pain   Pain Goal: Patients Stated Pain Goal: 0 (02/06/17 1200)               Decarlo Rivet JR,JOHN Ermin Parisien

## 2017-02-08 NOTE — Progress Notes (Signed)
Advanced Heart Failure Rounding Note  PCP: Juluis Mire, NP  Primary Cardiologist: Dr. Gwenlyn Found   Subjective:    Tolerating iHD well. BP improved. Deneis dyspnea. Says her bottom is sore. Has gotten out of bed x2 with PT.   Underwent AVF placement today.   Remains in AF. Rate back up to 105-120  Objective:   Weight Range: 266 lb (120.7 kg) Body mass index is 40.45 kg/m.   Vital Signs:   Temp:  [97.5 F (36.4 C)-98.8 F (37.1 C)] 97.5 F (36.4 C) (05/01 1148) Pulse Rate:  [57-131] 131 (05/01 1215) Resp:  [16-32] 19 (05/01 1641) BP: (81-148)/(41-121) 106/57 (05/01 1641) SpO2:  [90 %-100 %] 100 % (05/01 1215) Weight:  [266 lb (120.7 kg)] 266 lb (120.7 kg) (05/01 0300) Last BM Date: 02/07/17  Weight change: Filed Weights   02/06/17 0100 02/06/17 2300 02/08/17 0300  Weight: 280 lb (127 kg) 272 lb 3.2 oz (123.5 kg) 266 lb (120.7 kg)    Intake/Output:   Intake/Output Summary (Last 24 hours) at 02/08/17 1705 Last data filed at 02/08/17 1131  Gross per 24 hour  Intake              300 ml  Output             2020 ml  Net            -1720 ml     Physical Exam: General:  Lying in bed . No resp difficulty HEENT: normal Neck: supple. JVP 8-9 . Carotids 2+ bilat; no bruits. No lymphadenopathy or thryomegaly appreciated. Cor: PMI nonpalpable . IRR IRR tachy. No murmur Lungs: clear Abdomen: obese soft, nontender, nondistended. No hepatosplenomegaly. No bruits or masses. Good bowel sounds. Extremities: no cyanosis, clubbing, rash, edema LUE AVF scar Neuro: alert & orientedx3, cranial nerves grossly intact. moves all 4 extremities w/o difficulty. Affect pleasant   Telemetry:  AF 90-120.  Personally reviewed   Labs: CBC  Recent Labs  02/06/17 0459 02/07/17 0830 02/08/17 0425  WBC 13.5* 18.6* 18.6*  NEUTROABS 9.7*  --   --   HGB 8.3* 8.9* 9.1*  HCT 28.6* 29.6* 30.4*  MCV 93.5 93.4 95.0  PLT 183 188 956   Basic Metabolic Panel  Recent Labs   02/06/17 0502 02/07/17 0530 02/08/17 0425  NA  --  137 139  K  --  4.9 3.5  CL  --  97* 102  CO2  --  19* 26  GLUCOSE  --  78 79  BUN  --  66* 26*  CREATININE  --  4.99* 3.06*  CALCIUM  --  8.9 8.4*  PHOS 8.0*  --   --    Liver Function Tests  Recent Labs  02/06/17 0459  AST 48*  ALT 57*  ALKPHOS 96  BILITOT 3.2*  PROT 5.0*  ALBUMIN 2.8*    BNP: BNP (last 3 results)  Recent Labs  09/18/16 1126 11/07/16 1633 12/26/16 1643  BNP 991.0* 490.4* 1,116.0*    Transthoracic Echocardiography 01/11/17 Study Conclusions  - Left ventricle: The cavity size was mildly dilated. Wall   thickness was increased in a pattern of mild LVH. Left   ventricular geometry showed evidence of eccentric hypertrophy.   Systolic function was moderately reduced. The estimated ejection   fraction was in the range of 35% to 40%. Moderate diffuse   hypokinesis with no identifiable regional variations. Acoustic   contrast opacification revealed no evidence ofthrombus. - Ventricular septum: The contour  showed diastolic flattening.   These changes are consistent with RV volume overload. - Aortic valve: There was mild to moderate regurgitation directed   centrally in the LVOT. - Mitral valve: There was mild to moderate regurgitation directed   eccentrically and posteriorly. - Left atrium: The atrium was moderately dilated. - Right ventricle: The cavity size was severely dilated. Wall   thickness was normal. Systolic function was mildly reduced. - Right atrium: The atrium was severely dilated. - Tricuspid valve: There was malcoaptation of the valve leaflets.   There was moderate-severe regurgitation. - Pulmonary arteries: Systolic pressure was severely increased. PA   peak pressure: 71 mm Hg (S). - Pericardium, extracardiac: A trivial pericardial effusion was   identified posterior to the heart.    Medications:     Scheduled Medications: . amiodarone  400 mg Oral BID  . darbepoetin  (ARANESP) injection - NON-DIALYSIS  150 mcg Subcutaneous Q Mon-1800  . feeding supplement (NEPRO CARB STEADY)  237 mL Oral BID BM  . hydrocortisone sod succinate (SOLU-CORTEF) inj  12.5 mg Intravenous Q12H  . insulin aspart  2-6 Units Subcutaneous Q4H  . lanthanum  1,000 mg Oral TID WC  . mouth rinse  15 mL Mouth Rinse BID  . midodrine  15 mg Oral TID WC  . pantoprazole  40 mg Oral Daily    Infusions: . sodium chloride 10 mL/hr at 02/08/17 0908  . lactated ringers 50 mL/hr at 02/08/17 1516  . vancomycin      PRN Medications: acetaminophen (TYLENOL) oral liquid 160 mg/5 mL, diphenhydrAMINE-zinc acetate, Gerhardt's butt cream, heparin, ondansetron (ZOFRAN) IV, oxyCODONE-acetaminophen, senna-docusate, simethicone   Assessment/Plan   1. Acute on chronic combined systolic and diastolic CHF: EF 82-50% on TEE 01/13/17 (down from 55% in March 2018 when first admitted). Likely tachy mediated from atrial fibrillation.  - She has biventricular failure R>>L.  - Volume status much improved. Now tolerating iHD.  - Continue midodrine 15 mg TID for now. Can wean if Renal feels appopriate - Not candidate for ACE or b-blocker at this point. Will repeat echo to reassess EF,  2. Left frontal ICD with parafalcine SDH: Trace right midline shift. No CVA. Per Neuro her small ICH/SDH does not explain the degree of her AMS. Likely a component of metabolic encephalopathy due to renal failure. - Has right foot drop o/w stable - Neuro status stable.  - Seen by NSU. CT head with resolving ICH. Ok for DVT prophylaxis dose heparin  - Continue current medical management.  3. PAF: Failed DCCV 3/27, 4/5. Had DCCV with return to NSR on 01/20/17, went back into Afib 01/24/17.  - Rate back up.   - No longer a candidate for DCCV as she is off anticoagulation due to [place].  - Increase amio to 400 bid 4. Acute on chronic kidney disease => ESRD - Tolerating iHD  5. Obesity hypoventilation syndrome 6. Septic shock:   -  Completed ABX  01/26/17.   - Repeat BCx NGTD. Afebrile.    - No change.  7. 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 .  - No change to current plan.  8. Acute respiratory failure: - Stable on [place] 02.  9. NSVT:  - Quiescent on po amio   Length of Stay: 32  Glori Bickers, MD  02/08/2017, 5:05 PM Advanced Heart Failure Team  Pager [phone] M-F 7am-4pm.  Please contact [facility] for night-coverage after hours (4p -7a ) and weekends on [email or web address]

## 2017-02-08 NOTE — Progress Notes (Signed)
   Subjective:  [name] reports feeling well after her surgery. She denies significant pain. She states she had extensive bowel movements shortly afterwards.  Objective:  Cultures: Bcx 4/19: NGTD GI panel 4/19: Neg Bcx 4/2: NGTD Bcx 4/1: E. Faecalis, Vancomycin resistance  5/1 Vancomycin Ampicillin  STUDIES 4/5 TEE without vegetations   Antibiotics:  Vital signs in last 24 hours: Vitals:   02/08/17 1540 02/08/17 1611 02/08/17 1638 02/08/17 1641  BP: 96/62 (!) 81/71 (!) 88/52 (!) 106/57  Pulse:      Resp: (!) 24 (!) 23 16 19   Temp:      TempSrc:      SpO2:      Weight:      Height:       Physical Exam  Cardiovascular:  Irregularly Irregular   Pulmonary/Chest: Effort normal.  Diffuse Rhonci   Abdominal: Soft. She exhibits no distension. There is no tenderness.  Musculoskeletal:  Hands neurovascularly intact  Neurological: She is alert.  Skin: Skin is warm and dry.  Psychiatric: Mood and affect normal.   Consult Hx: VVS (AVF placement), nephrology, neurosurgery, oncology, neurology, PM&R, ID, EP, PCCM, HF  Assessment/Plan: Principal Problem:   Cardiogenic shock (Paige Marshall)  Acute on chronic combined systolic and diastolic HF: Down 696 lbs since admission weight. BP stable off pressors however continues on Midodrine 15 mg TID. She missed a dose of midodrine today and received anesthesia with subsequent decrease in reported blood pressure shortly after surgery. No clinical signs or symptoms.  AKI on CKD 4, likely ESRD now on HD MWF: To have right AVG placed today. Patient has a tunneled dialysis catheter. 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 She has hx of fall in December 2017 however was without focal neuro syx on admission. FU CT head shows resolving hematomas without midline shift.  Neurosurgery on board,  [place] with anticoag during surgery today however not currently felt to be a good long term anticoagulation candidate. -Further recs per neurosurgery  Chronic Atrial Fibrillation: S/p failed DCCV 3/28 & 4/5. Briefly cardioverted after DCCV 4/12. Continues in AFib however rate controlled on amiodarone 200 mg BID. -Continue amio -Holding anticoagulation in setting of SDH  Acute on Chronic Anemia: S/p 2 units pRBCs 4/22. Hgb stable currently at 9.1. -Aranesp qMondays -CBC daily  Persistent shock s/p VRE bacteremia: Resolved. Repeat blood cultures on 4/19 show NGTD.  Blood pressures have been stable, off stress dose steroids -on midodrine 15mg  TID -completed ampicillin 4/18   Dispo: Anticipated discharge pending clinical course. She has discussed going to [facility] later this week with social work Biochemist, clinical today as a likely next step in her care.  Paige Salina, MD PGY-II Internal Medicine Resident Pager# [phone] 02/08/2017, 4:50 PM

## 2017-02-08 NOTE — Transfer of Care (Signed)
Immediate Anesthesia Transfer of Care Note  Patient: Paige Marshall  Procedure(s) Performed: Procedure(s): ARTERIOVENOUS (AV) GORTEX GRAFT INSERTION RIGHT LOWER ARM (Right)  Patient Location: PACU  Anesthesia Type:MAC  Level of Consciousness: awake, alert , oriented and patient cooperative  Airway & Oxygen Therapy: Patient Spontanous Breathing and Patient connected to nasal cannula oxygen  Post-op Assessment: Report given to RN, Post -op Vital signs reviewed and stable and Patient moving all extremities X 4  Post vital signs: Reviewed and stable  Last Vitals:  Vitals:   02/08/17 0809 02/08/17 1148  BP: 111/68   Pulse: (!) 102   Resp: (!) 21   Temp: 37.1 C 36.4 C    Last Pain:  Vitals:   02/08/17 0809  TempSrc: Oral  PainSc:       Patients Stated Pain Goal: 0 (16/60/63 0160)  Complications: No apparent anesthesia complications

## 2017-02-08 NOTE — Op Note (Signed)
    NAME: Paige Marshall    MRN: [id number] DOB: July 26, 1956    DATE OF OPERATION: 02/08/2017  PREOP DIAGNOSIS:    Chronic kidney disease  POSTOP DIAGNOSIS:    Same  PROCEDURE:    New right forearm AV graft (4-7 mm PTFE graft)   SURGEON: Judeth Cornfield. Scot Dock, MD, FACS  ASSIST: Gerri Lins PA  ANESTHESIA: Local with sedation   EBL: Minimal  INDICATIONS:    Paige Marshall is a 61 y.o. female who has a tunneled dialysis catheter. We were asked to place permanent access. She had an ischemic finger on the left hand and therefore I did not think she was candidate for access to there currently. Her cephalic vein was thrombosed. Her basilic vein was very small. Therefore a AV graft was placed.  FINDINGS:    The arterial aspect of the graft is along the ulnar aspect/medial aspect of the forearm. The venous aspect of the graft is along the radial/lateral aspect of the forearm.  TECHNIQUE:    The patient was taken to the operating room and was sedated. The right upper extremity was prepped and draped in usual sterile fashion. After the skin was anesthetized with 1% lidocaine, a longitudinal incision was made above the antecubital level. Here the brachial artery and brachial vein were dissected free beneath the fascia. Using one distal counterincision, after the skin was anesthetized, a 4-7 mm PTFE graft was tunneled between the 2 incisions. The arterial aspect of the graft was tunneled along the medial aspect of the forearm. The patient was heparinized. The brachial artery was quite proximally and distally and a longitudinal arteriotomy was made. A small segment of the 4 mm of the graft was excised, the graft slightly spatulated and sewn end-to-side to the artery using continuous 6-0 Prolene suture. The graft is then for the appropriate length for anastomosis to the brachial vein. The vein was ligated distally and spatulated proximally. The graft was cut the appropriate length,  spatulated and sewn end to end to the vein using continuous 6-0 Prolene suture. At the completion was a good thrill in the graft. There was a radial and ulnar signal with Doppler. Hemostasis was obtained the wounds. The wounds were closed with the 3-0 vicryl and  the skin closed with 4-0 Vicryl. Dermabond was applied. The patient tolerated the procedure well was transferred to the recovery room in stable condition. All needle and sponge counts were correct.  [name], MD, FACS [facility]  DATE OF DICTATION:   02/08/2017

## 2017-02-08 NOTE — Care Management Note (Addendum)
Case Management Note  Patient Details  Name: EUNIQUE BALIK MRN: [id number] Date of Birth: 10-19-1955  Subjective/Objective:    Pt presented as a transfer from [facility] with CHF- Cardiogenic Shock and AKI- Pt has a HD Schedule for MWF. Pt was discussed in the [place]. LTAC followed up to see if appropriate. Pt meets criteria for [facility]. CM did speak with MD in regards to plan of care- MD agreeable.  CM will continue to monitor.                 Action/Plan: CM did reach out to both LTAC's- [facility] was able to offer a bed. CM did speak with pt in regards to LTAC and she is agreeable to LTAC post d/c. Liaison to pspeak with family as well. Possibility that pt will be ready for d/c post HD on 02-09-17.   Expected Discharge Date:                  Expected Discharge Plan:  Long Term Acute Care (LTAC)  In-House Referral:  Clinical Social Work  Discharge planning Services  CM Consult  Post Acute Care Choice:  NA Choice offered to:  NA  DME Arranged:  N/A DME Agency:  NA  HH Arranged:  NA HH Agency:  NA  Status of Service:  Completed, signed off  If discussed at [employer] of Stay Meetings, dates discussed:    Additional Comments:  Bethena Roys, RN 02/08/2017, 3:36 PM

## 2017-02-08 NOTE — Care Management Important Message (Signed)
Important Message  Patient Details  Name: Paige Marshall MRN: [id number] Date of Birth: June 06, 1956   Medicare Important Message Given:  Yes    Bethena Roys, RN 02/08/2017, 3:24 PM

## 2017-02-09 ENCOUNTER — Encounter (HOSPITAL_COMMUNITY): Payer: Self-pay | Admitting: Vascular Surgery

## 2017-02-09 DIAGNOSIS — E2740 Unspecified adrenocortical insufficiency: Secondary | ICD-10-CM

## 2017-02-09 LAB — RENAL FUNCTION PANEL
ALBUMIN: 2.4 g/dL — AB (ref 3.5–5.0)
Anion gap: 14 (ref 5–15)
BUN: 36 mg/dL — ABNORMAL HIGH (ref 6–20)
CALCIUM: 8.4 mg/dL — AB (ref 8.9–10.3)
CO2: 23 mmol/L (ref 22–32)
CREATININE: 4.23 mg/dL — AB (ref 0.44–1.00)
Chloride: 100 mmol/L — ABNORMAL LOW (ref 101–111)
GFR calc Af Amer: 12 mL/min — ABNORMAL LOW (ref >60)
GFR, EST NON AFRICAN AMERICAN: 11 mL/min — AB (ref >60)
Glucose, Bld: 94 mg/dL (ref 65–99)
PHOSPHORUS: 5.8 mg/dL — AB (ref 2.5–4.6)
Potassium: 4.1 mmol/L (ref 3.5–5.1)
SODIUM: 137 mmol/L (ref 135–145)

## 2017-02-09 LAB — CBC
HEMATOCRIT: 30.6 % — AB (ref 36.0–46.0)
Hemoglobin: 9 g/dL — ABNORMAL LOW (ref 12.0–15.0)
MCH: 27.8 pg (ref 26.0–34.0)
MCHC: 29.4 g/dL — ABNORMAL LOW (ref 30.0–36.0)
MCV: 94.4 fL (ref 78.0–100.0)
Platelets: 203 10*3/uL (ref 150–400)
RBC: 3.24 MIL/uL — AB (ref 3.87–5.11)
RDW: 33.9 % — ABNORMAL HIGH (ref 11.5–15.5)
WBC: 16.6 10*3/uL — AB (ref 4.0–10.5)

## 2017-02-09 LAB — GLUCOSE, CAPILLARY
GLUCOSE-CAPILLARY: 92 mg/dL (ref 65–99)
GLUCOSE-CAPILLARY: 70 mg/dL (ref 65–99)
Glucose-Capillary: 76 mg/dL (ref 65–99)
Glucose-Capillary: 64 mg/dL — ABNORMAL LOW (ref 65–99)
Glucose-Capillary: 89 mg/dL (ref 65–99)

## 2017-02-09 MED ORDER — IVABRADINE HCL 5 MG PO TABS
5.0000 mg | ORAL_TABLET | Freq: Once | ORAL | Status: AC
  Administered 2017-02-09: 5 mg via ORAL
  Filled 2017-02-09: qty 1

## 2017-02-09 MED ORDER — METOPROLOL TARTRATE 12.5 MG HALF TABLET
12.5000 mg | ORAL_TABLET | Freq: Once | ORAL | Status: AC
  Administered 2017-02-09: 12.5 mg via ORAL
  Filled 2017-02-09: qty 1

## 2017-02-09 NOTE — Progress Notes (Signed)
PT Cancellation Note  Patient Details Name: Paige Marshall MRN: [id number] DOB: August 31, 1956   Cancelled Treatment:    Reason Eval/Treat Not Completed: Patient at procedure or test/unavailable.  Attempted to see pt again this PM, remains off unit for HD.  Will f/u per plan of care.    Lynore Coscia E Penven-Crew 02/09/2017, 1:54 PM

## 2017-02-09 NOTE — Procedures (Signed)
I was present at this dialysis session. I have reviewed the session itself and made appropriate changes.   Patient status post AV graft placement yesterday with Dr. Scot Dock.  Currently on dialysis, tolerating well thus far. Using a 2K bath. Goal UF 3 L. Patient without complaints. Tunnel catheter working well.  Patient potentially to be discharged to [facility]. She has tolerated intermittent hemodialysis today, if no problems developed during treatment she should be safe for transfer.  Filed Weights   02/06/17 0100 02/06/17 2300 02/08/17 0300  Weight: 127 kg (280 lb) 123.5 kg (272 lb 3.2 oz) 120.7 kg (266 lb)     Recent Labs Lab 02/09/17 0502  NA 137  K 4.1  CL 100*  CO2 23  GLUCOSE 94  BUN 36*  CREATININE 4.23*  CALCIUM 8.4*  PHOS 5.8*     Recent Labs Lab 02/04/17 0550 02/05/17 0515 02/06/17 0459 02/07/17 0830 02/08/17 0425  WBC 13.8* 13.8* 13.5* 18.6* 18.6*  NEUTROABS 11.5* 11.4* 9.7*  --   --   HGB 8.0* 8.3* 8.3* 8.9* 9.1*  HCT 27.3* 28.7* 28.6* 29.6* 30.4*  MCV 92.9 93.5 93.5 93.4 95.0  PLT 170 174 183 188 185    Scheduled Meds: . amiodarone  400 mg Oral BID  . darbepoetin (ARANESP) injection - NON-DIALYSIS  150 mcg Subcutaneous Q Mon-1800  . feeding supplement (NEPRO CARB STEADY)  237 mL Oral BID BM  . heparin subcutaneous  5,000 Units Subcutaneous Q8H  . hydrocortisone sod succinate (SOLU-CORTEF) inj  12.5 mg Intravenous Q12H  . insulin aspart  2-6 Units Subcutaneous Q4H  . ivabradine  5 mg Oral Once  . lanthanum  1,000 mg Oral TID WC  . mouth rinse  15 mL Mouth Rinse BID  . midodrine  15 mg Oral TID WC  . pantoprazole  40 mg Oral Daily   Continuous Infusions: . sodium chloride Stopped (02/09/17 0839)   PRN Meds:.acetaminophen (TYLENOL) oral liquid 160 mg/5 mL, diphenhydrAMINE-zinc acetate, Gerhardt's butt cream, heparin, ondansetron (ZOFRAN) IV, oxyCODONE-acetaminophen, senna-docusate, simethicone   Pearson Grippe  MD 02/09/2017, 10:30 AM

## 2017-02-09 NOTE — Progress Notes (Signed)
Advanced Heart Failure Rounding Note  PCP: Juluis Mire, NP  Primary Cardiologist: Dr. Gwenlyn Found   Subjective:    Tolerating iHD well. BP improved. Underwent AVF placement yesterday.   Remains in AF. Rate back up to 105-120. Denies SOB at rest. No chest pain.    Objective:   Weight Range: 266 lb (120.7 kg) Body mass index is 40.45 kg/m.   Vital Signs:   Temp:  [97.5 F (36.4 C)-98.2 F (36.8 C)] 97.8 F (36.6 C) (05/02 0805) Pulse Rate:  [78-131] 100 (05/02 0805) Resp:  [16-32] 20 (05/02 0805) BP: (81-146)/(52-121) 116/60 (05/02 0805) SpO2:  [94 %-100 %] 100 % (05/02 0805) Last BM Date: 02/08/17  Weight change: Filed Weights   02/06/17 0100 02/06/17 2300 02/08/17 0300  Weight: 280 lb (127 kg) 272 lb 3.2 oz (123.5 kg) 266 lb (120.7 kg)    Intake/Output:   Intake/Output Summary (Last 24 hours) at 02/09/17 0819 Last data filed at 02/09/17 0600  Gross per 24 hour  Intake          1161.67 ml  Output               20 ml  Net          1141.67 ml     Physical Exam: General: Lying in bed, eating breakfast.  HEENT: normal, atraumatic  Neck: supple. JVP 9 cm. Carotids 2+ bilat; no bruits. No lymphadenopathy or thryomegaly appreciated. Cor: PMI nonpalpable. Irregular rate and rhythm, tachycardic. No murmurs, rubs or gallops.  Lungs: Clear in bilateral upper lobes, diminished in bases.  Abdomen: obese soft, nontender, nondistended. No hepatosplenomegaly. No bruits or masses. Good bowel sounds.  Extremities: no cyanosis, clubbing, rash, No edema. LUE fistula  Neuro: alert & orientedx3, cranial nerves grossly intact. moves all 4 extremities w/o difficulty. Affect pleasant  Telemetry:  Afib rates in the 100's - 120's.    Labs: CBC  Recent Labs  02/07/17 0830 02/08/17 0425  WBC 18.6* 18.6*  HGB 8.9* 9.1*  HCT 29.6* 30.4*  MCV 93.4 95.0  PLT 188 976   Basic Metabolic Panel  Recent Labs  02/08/17 0425 02/09/17 0502  NA 139 137  K 3.5 4.1  CL 102  100*  CO2 26 23  GLUCOSE 79 94  BUN 26* 36*  CREATININE 3.06* 4.23*  CALCIUM 8.4* 8.4*  PHOS  --  5.8*   Liver Function Tests  Recent Labs  02/09/17 0502  ALBUMIN 2.4*    BNP: BNP (last 3 results)  Recent Labs  09/18/16 1126 11/07/16 1633 12/26/16 1643  BNP 991.0* 490.4* 1,116.0*    Transthoracic Echocardiography 01/11/17 Study Conclusions  - Left ventricle: The cavity size was mildly dilated. Wall   thickness was increased in a pattern of mild LVH. Left   ventricular geometry showed evidence of eccentric hypertrophy.   Systolic function was moderately reduced. The estimated ejection   fraction was in the range of 35% to 40%. Moderate diffuse   hypokinesis with no identifiable regional variations. Acoustic   contrast opacification revealed no evidence ofthrombus. - Ventricular septum: The contour showed diastolic flattening.   These changes are consistent with RV volume overload. - Aortic valve: There was mild to moderate regurgitation directed   centrally in the LVOT. - Mitral valve: There was mild to moderate regurgitation directed   eccentrically and posteriorly. - Left atrium: The atrium was moderately dilated. - Right ventricle: The cavity size was severely dilated. Wall   thickness was normal.  Systolic function was mildly reduced. - Right atrium: The atrium was severely dilated. - Tricuspid valve: There was malcoaptation of the valve leaflets.   There was moderate-severe regurgitation. - Pulmonary arteries: Systolic pressure was severely increased. PA   peak pressure: 71 mm Hg (S). - Pericardium, extracardiac: A trivial pericardial effusion was   identified posterior to the heart.    Medications:     Scheduled Medications: . amiodarone  400 mg Oral BID  . darbepoetin (ARANESP) injection - NON-DIALYSIS  150 mcg Subcutaneous Q Mon-1800  . feeding supplement (NEPRO CARB STEADY)  237 mL Oral BID BM  . heparin subcutaneous  5,000 Units Subcutaneous Q8H    . hydrocortisone sod succinate (SOLU-CORTEF) inj  12.5 mg Intravenous Q12H  . insulin aspart  2-6 Units Subcutaneous Q4H  . lanthanum  1,000 mg Oral TID WC  . mouth rinse  15 mL Mouth Rinse BID  . midodrine  15 mg Oral TID WC  . pantoprazole  40 mg Oral Daily    Infusions: . sodium chloride 10 mL/hr at 02/08/17 0908  . lactated ringers 50 mL/hr at 02/08/17 1516    PRN Medications: acetaminophen (TYLENOL) oral liquid 160 mg/5 mL, diphenhydrAMINE-zinc acetate, Gerhardt's butt cream, heparin, ondansetron (ZOFRAN) IV, oxyCODONE-acetaminophen, senna-docusate, simethicone   Assessment/Plan   1. Acute on chronic combined systolic and diastolic CHF: EF 79-39% on TEE 01/13/17 (down from 55% in March 2018 when first admitted). Likely tachy mediated from atrial fibrillation.  - She has biventricular failure R>>L.  - Volume status stable, tolerating iHD.   - Continue midodrine 15mg  TID.  - Not candidate for ACE or b-blocker at this point.  - Needs repeat Echo to assess EF - will order today.  2. Left frontal ICD with parafalcine SDH: Trace right midline shift. No CVA. Per Neuro her small ICH/SDH does not explain the degree of her AMS. Likely a component of metabolic encephalopathy due to renal failure. - Has right foot drop o/w stable - Neuro status stable.  - Seen by NSU. CT head with resolving ICH. Ok for DVT prophylaxis dose heparin  - Continue current medical management.  3. PAF: Failed DCCV 3/27, 4/5. Had DCCV with return to NSR on 01/20/17, went back into Afib 01/24/17.  -Rate remains elevated.  - No longer a candidate for DCCV as she is off anticoagulation due to [place].  - Amiodarone increased to 400mg  BID.  - Consider AVN ablation? 4. Acute on chronic kidney disease => ESRD - Tolerating iHD.  5. Obesity hypoventilation syndrome 6. Septic shock:   - Completed ABX  01/26/17.   - Repeat BCx NGTD. Afebrile.    - No change to current plan.  7. PAH with acute RV failure/cor pulmonale: WHO  group II and III. .  - No change to current plan.  8. Acute respiratory failure: - Stable on [place] 02.  9. NSVT:  - Continue po Amio.    Length of Stay: [place], NP  02/09/2017, 8:19 AM Advanced Heart Failure Team  Pager [phone] M-F 7am-4pm.  Please contact [facility] for night-coverage after hours (4p -7a ) and weekends on [email or web address]  Patient seen and examined with Jettie Booze, NP. We discussed all aspects of the encounter. I agree with the assessment and plan as stated above.   She is much improved and tolerating HD well. Only remaining issue from our standpoint is AF with RVR and rates up to 130-140 despite amiodarone therapy. Options are very limited. Unable to  undergo DC-CV due to inability to anticoagulate. Has not responded well or tolerated b-blocker therapy. Not candidate for AV node ablation as she is not candidate for PPM with ESRD (high risk device infection). Will try ivabradine - while typically avoided in patients with AF - there have been patients who have responded and given low EF she is a candidate. Will give test doses today and see response.   Glori Bickers, MD  8:24 PM

## 2017-02-09 NOTE — Progress Notes (Signed)
OT Cancellation Note  Patient Details Name: Paige Marshall MRN: [id number] DOB: 1956-07-12   Cancelled Treatment:    Reason Eval/Treat Not Completed: Patient at procedure or test/ unavailable. Will follow up as time allows.  Binnie Kand M.S., OTR/L Pager: [phone]  02/09/2017, 2:09 PM

## 2017-02-09 NOTE — Progress Notes (Addendum)
Contacted [facility] Liaison to follow up on bed availability and will they accept with current HR. Contacted Cardiology to follow up on readiness to dc to LTAC. Jonnie Finner RN CCM Case Mgmt phone [phone]  02/09/2017 4:40 pm [facility] can accept with current HR. Made [facility] aware possible dc 02/10/2017.

## 2017-02-09 NOTE — Progress Notes (Signed)
PT Cancellation Note  Patient Details Name: Paige Marshall MRN: [id number] DOB: [birth year]   Cancelled Treatment:    Reason Eval/Treat Not Completed: Patient at procedure or test/unavailable.  Pt at HD.  Will f/u per POC.    Calix Heinbaugh E Penven-Crew 02/09/2017, 11:42 AM

## 2017-02-09 NOTE — Progress Notes (Addendum)
   Subjective:  Paige Marshall was seen and evaluated today during HD. She complains of back pain, which she notes is usual as shes ending HD sessions. Hopeful for dc today.  Objective: Vital signs in last 24 hours: Vitals:   02/09/17 1005 02/09/17 1010 02/09/17 1030 02/09/17 1100  BP: (!) 101/55 128/82 (!) 88/56 (!) 100/45  Pulse: (!) 128 (!) 120 (!) 125 (!) 120  Resp: 17 18 17 18   Temp: 98.2 F (36.8 C)     TempSrc: Oral     SpO2:  100%    Weight:      Height:       Physical Exam  Cardiovascular:  Irregularly Irregular   Pulmonary/Chest: Effort normal. No respiratory distress. She has no wheezes.  Abdominal: Soft. She exhibits no distension. There is no tenderness.  Musculoskeletal:  Hands neurovascularly intact. AVG site clean.  Neurological: She is alert.  Skin: Skin is warm and dry.  Psychiatric: Mood and affect normal.   Consult Hx: VVS (AVF placement), nephrology, neurosurgery, oncology, neurology, PM&R, ID, EP, PCCM, HF  Assessment/Plan: Principal Problem:   Cardiogenic shock ([place])  Acute on chronic combined systolic and diastolic HF: Down 403 lbs. BP stable off pressors however continues on Midodrine 15 mg TID currently.  -Volume control with HD  [facility] now on HD MWF: Right AVG placed 5/1 without apparent complication. Graft can be used in 4 weeks. Patient has a tunneled dialysis catheter.  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   Chronic Atrial Fibrillation: S/p failed DCCV 3/28 & 4/5. Briefly cardioverted after DCCV 4/12. Continues in AFib, had some asymptomatic RVR ON with rate 130. Improved with additional amiodarone. -Amio increased to 400mg  BID per cards, appreciate recs -Holding anticoagulation in setting of  SDH  Acute on Chronic Anemia: S/p 2 units pRBCs 4/22. Hb has been stable past several days. -CBC tomorrow if still admitted  Persistent shock s/p VRE bacteremia: Resolved. Repeat blood cultures on 4/19 show NGTD.  Blood pressures have been stable, off stress dose steroids -on midodrine 15mg  TID -completed ampicillin 4/18  Adrenal Insufficiency: May need to briefly augment steroids if pt were to be persistently hypotensive after surgery.   Dispo: Pt will need LTAC. SW notes [facility] will likely have bed available for pt today or tomorrow.   Einar Gip, D.O.  PGY-I Internal Medicine Resident Pager# [phone] 02/09/2017, 11:30 AM

## 2017-02-09 NOTE — Discharge Summary (Signed)
Name: Paige Marshall MRN: [id number] DOB: 1956/07/04 61 y.o. PCP: Kerin Perna, NP  Date of Admission: 12/26/2016  2:44 PM Date of Discharge: 02/10/2017 Attending Physician: Axel Filler, MD  Discharge Diagnosis: 1. Acute on Chronic Hypoxic and Hypercapnic Respiratory Failure, COPD 2. Acute on Chronic Combined Systolic and Diastolic CHF 3. Acute on Chronic Renal Failure, now ESRD on HD MWF 4. Paroxysmal Atrial Fibrillation 5. Subdural hematoma, Intracerebral Hemorrhage 6. VRE bacteremia 7. Hypotension, Relative Adrenal Insufficiency 8. Thrombocytopenia, ?DIC, Resolved  Principal Problem:   Cardiogenic shock ([place]) Active Problems:   Essential hypertension   Obstructive sleep apnea   Morbid obesity (HCC)   Obesity hypoventilation syndrome (HCC)   COPD mixed type (HCC)   Chronic respiratory failure with hypoxia (HCC)   Acute kidney injury superimposed on CKD (HCC)   Acute on chronic combined systolic and diastolic CHF (congestive heart failure) (HCC)   Thoracic aortic atherosclerosis (HCC)   Cardiorenal syndrome   VRE bacteremia   PAH (pulmonary artery hypertension) (HCC)   Chronic atrial fibrillation ([place])   Left-sided nontraumatic intracerebral hemorrhage ([place])   Encounter for central line placement   SDH (subdural hematoma) (HCC)   Hygroma   Thrombocytopenia (HCC)   DIC (disseminated intravascular coagulation) (HCC)   HIT (heparin-induced thrombocytopenia) (HCC)   Abdominal pain   Discharge Medications: Allergies as of 02/10/2017      Reactions   Penicillins Hives, Itching, Swelling, Other (See Comments)   Tongue swelling Has patient had a PCN reaction causing immediate rash, facial/tongue/throat swelling, SOB or lightheadedness with hypotension: Yes Clarified with the patient her penicillin allergy. Pt has tolerated cefepime and ampicillin.   Citrus Hives      Medication List    STOP taking these medications   carvedilol 6.25 MG  tablet Commonly known as:  COREG   diltiazem 180 MG 24 hr capsule Commonly known as:  DILACOR XR   furosemide 40 MG tablet Commonly known as:  LASIX   metolazone 2.5 MG tablet Commonly known as:  ZAROXOLYN   oxyCODONE 5 MG immediate release tablet Commonly known as:  Oxy IR/ROXICODONE   potassium chloride SA 20 MEQ tablet Commonly known as:  K-DUR,KLOR-CON   predniSONE 10 MG tablet Commonly known as:  DELTASONE   propafenone 150 MG tablet Commonly known as:  RYTHMOL   warfarin 5 MG tablet Commonly known as:  COUMADIN     TAKE these medications   amiodarone 400 MG tablet Commonly known as:  PACERONE Take 1 tablet (400 mg total) by mouth 2 (two) times daily.   ARTIFICIAL TEAR OP Apply 1 drop to eye every 6 (six) hours as needed (dry eyes).   aspirin 81 MG EC tablet Take 1 tablet (81 mg total) by mouth daily. Start taking on:  02/11/2017   bisacodyl 5 MG EC tablet Commonly known as:  DULCOLAX Take 1 tablet (5 mg total) by mouth daily as needed for moderate constipation.   Darbepoetin Alfa 150 MCG/0.3ML Sosy injection Commonly known as:  ARANESP Inject 0.3 mLs (150 mcg total) into the skin every Monday at 6 PM. Start taking on:  02/14/2017   diclofenac sodium 1 % Gel Commonly known as:  VOLTAREN Apply 4 g topically every 6 (six) hours as needed (pain).   docusate sodium 100 MG capsule Commonly known as:  COLACE Take 1 capsule (100 mg total) by mouth 2 (two) times daily.   feeding supplement (NEPRO CARB STEADY) Liqd Take 237 mLs by mouth 2 (two) times daily  between meals. Start taking on:  02/11/2017   ferrous sulfate 325 (65 FE) MG tablet Take 1 tablet (325 mg total) by mouth 3 (three) times daily with meals.   Gerhardt's butt cream Crea Apply 1 application topically daily.   insulin aspart 100 UNIT/ML injection Commonly known as:  novoLOG Inject 0-9 Units into the skin 3 (three) times daily with meals.   ivabradine 5 MG Tabs tablet Commonly known as:   CORLANOR Take 1 tablet (5 mg total) by mouth 2 (two) times daily with a meal.   lanthanum 1000 MG chewable tablet Commonly known as:  FOSRENOL Chew 1 tablet (1,000 mg total) by mouth 3 (three) times daily with meals.   midodrine 5 MG tablet Commonly known as:  PROAMATINE Take 3 tablets (15 mg total) by mouth 3 (three) times daily with meals.   multivitamin with minerals Tabs tablet Take 1 tablet by mouth daily.   omega-3 acid ethyl esters 1 g capsule Commonly known as:  LOVAZA Take 1 g by mouth 2 (two) times daily.   oxyCODONE-acetaminophen 5-325 MG tablet Commonly known as:  PERCOCET/ROXICET Take 1-2 tablets by mouth every 4 (four) hours as needed for severe pain.   polyethylene glycol packet Commonly known as:  MIRALAX / GLYCOLAX Take 17 g by mouth daily as needed. What changed:  reasons to take this  additional instructions   PRESCRIPTION MEDICATION Inhale into the lungs at bedtime. BIPAP   PROAIR HFA 108 (90 Base) MCG/ACT inhaler Generic drug:  albuterol INHALE 2 PUFFS EVERY 6 HOURS AS NEEDED FOR WHEEZING OR SHORTNESS OF BREATH.   promethazine 25 MG tablet Commonly known as:  PHENERGAN Take 25 mg by mouth every 6 (six) hours as needed for nausea or vomiting.   SENNA S 8.6-50 MG tablet Generic drug:  senna-docusate Take 2 tablets by mouth See admin instructions. Take 2 tablet by mouth daily at bedtime, may also take 2 tablets as needed for constipation   zolpidem 5 MG tablet Commonly known as:  AMBIEN Take 5 mg by mouth at bedtime as needed for sleep.       Disposition and follow-up:   [name] was discharged from [facility] in stable condition.  At the hospital follow up visit please address:  1.   A) ESRD on HD, MWF: Ensure compliance with HD schedule and full sessions as this is our volume management. She complains of back pain at the end of sessions however would encourage her to continue.  B) AVG placed RUE: Placed 5/1.  Can be used in approximately early June. Ensure site clean and not-infected C) Paroxysmal atrial fibrillation: Only ASA 81mg  daily for anticoag in setting of SDH/ICH. Unclear when patient will be able to resume standard anticoagulation. She is rate controlled with Amiodarone.  D) Subdural hematoma, Intracranial hemorrhage: Currently without neurologic deficit. Should status deteriorate, would encourage repeat head CT. She will require repeat head CT ~1 month E) Chronic Respiratory Failure, COPD, OSA: On 3L via [place] at baseline. Ensure compliance with this and inhalers. Would encourage patient to use BiPAP or CPAP at night F)Relative adrenal insufficiency, Hypotension: On Midodrine and doing well. May require stress-dose steroids in peri-operative period if hypotensive.   2.  Labs / imaging needed at time of follow-up: Bi-weekly CBC. Renal function panel Qdaily or every other day. FU head CT in 1 month.   3.  Pending labs/ test needing follow-up: None.  Hospital Course by problem list: Principal Problem:   Cardiogenic  shock ([place]) Active Problems:   Essential hypertension   Obstructive sleep apnea   Morbid obesity ([place])   Obesity hypoventilation syndrome ([place])   COPD mixed type (HCC)   Chronic respiratory failure with hypoxia (HCC)   Acute kidney injury superimposed on CKD ([place])   Acute on chronic combined systolic and diastolic CHF (congestive heart failure) (HCC)   Thoracic aortic atherosclerosis (HCC)   Cardiorenal syndrome   VRE bacteremia   PAH (pulmonary artery hypertension) (HCC)   Chronic atrial fibrillation ([place])   Left-sided nontraumatic intracerebral hemorrhage ([place])   Encounter for central line placement   SDH (subdural hematoma) (HCC)   Hygroma   Thrombocytopenia (HCC)   DIC (disseminated intravascular coagulation) (HCC)   HIT (heparin-induced thrombocytopenia) (HCC)   Abdominal pain   1. Acute on Chronic Combined Systolic and Diastolic CHF, complicated by cardiogenic  shock Admitted 3/18 with SOB secondary to massive volume overload due to worsening of CKD without response to lasix. She was diuresed 140 lbs with CRRT and assistance of Milrinone. ECHO after 100 lbs of diuresis with LVEF 35-40% and markedly elevated PA peak pressure at 71. Discharge and dry weight 277 lbs. Volume status to be controlled with HD.  2. Paroxysmal Atrial Fibrillation with RVR, Initially supratherapeutic INR Was on coumadin and propafenone prior to admission and presenting INR 4.1. She was anticoagulated with heparin during admission until SDH was discovered 4/12. Failed DCCV 3/27, 4/5 and briefly maintained NSR after DCCV 4/12 however returned to AFib 4/16. Started on amiodarone and dose increased to 400mg  BID after 1 episode of RVR. ASA 81mg  started.  3. Subdural Hematoma, Intracerebral hemorrhage, History of Fall in December, 2017 CT head obtained 4/12 due to AMS and slurred speech demonstrated 2cm of hemorrhage with surrounding edema with 23mm midline shift. Follow-up MRI with an unchanged left-subdural hematoma. MRA/MRV showed patent vessels however with a 80mm left PCOM artery aneurysm. Neurology and neurosurgery consulted who started Keppra. Neurosurgery does not recommend repeat scan for at least 1 month unless clinical status deteriorates.   4. VRE Bacteremia Bcx obtained 4/1 due to new fever and resulted with VRE. TEE 4/5 without vegetations.  Completed course of Ampicillin with clearance of blood cultures on repeat despite being unable to do complete line holiday.   5. COPD On 3L via [place] at baseline PTA and has been weaned back to this.   6. Thrombocytopenia, ?DIC Seen by hematology 4/16 due to new and progressive thrombocytopenia (252>39 over 1 week). Smear with few schistocytes, elevated D-dimer, elevated PTT and PT. SFA <1. Thrombocytopenia/DIC resolved with treatment of underlying condition. Platelets 220 at time of discharge.   7. Hypotension, Relative Adrenal  Insufficiency Plasma cortisol normal however BP improved with stress-dose steroids. She had been off stress-dose steroids for several days prior to discharge. Blood pressure support with Midodrine TID per nephrology recommendations.   Discharge Vitals:   BP (!) 100/45   Pulse (!) 120   Temp 98.2 F (36.8 C) (Oral)   Resp 18   Ht 5\' 8"  (1.727 m)   Wt 277 lb (125.6 kg)   LMP 09/07/2011   SpO2 100%   BMI 42.12 kg/m   Pertinent Labs, Studies, and Procedures:  RHC 3/30: Mod-severe PAH with RV failure.  TTE 4/3: LVEF 35-40% w/ moderate diffuse hypokinesis TEE 4/5: Without vegitation MRA/MRV: Patent vasculature.  CT head 4/30: Smaller subacute left frontal and left parafalcine subdural hematoma. No midline shift CT head 4/30: Smaller SDH, no midline shift.   Discharge Instructions:  Ms. Allers will require extensive rehabilitation given her prolonged hospital course and deconditioning. She will be discharged to [facility] for close-care and intensive PT. She will require HD MWF.   SignedEinar Gip, DO 02/09/2017, 11:41 AM   Pager: [phone]

## 2017-02-09 NOTE — Progress Notes (Signed)
   VASCULAR SURGERY ASSESSMENT & PLAN:   1 Day Post-Op s/p: Placement of new right forearm AVG. Graft is patent. No evidence of steal symptoms.  Vascular will be available as needed.  May use graft in 4 weeks. The arterial side of the graft is along the ulnar/ medial side of the forearm.   SUBJECTIVE:   No complaints.   PHYSICAL EXAM:   Vitals:   02/09/17 0203 02/09/17 0233 02/09/17 0357 02/09/17 0454  BP: (!) 120/106 100/72 124/80 102/62  Pulse: (!) 126 (!) 121 (!) 130   Resp:  (!) 26    Temp:      TempSrc:      SpO2: 95% 97%    Weight:      Height:       Graft has audible bruit. Right hand warm and well perfused.   LABS:   Lab Results  Component Value Date   WBC 18.6 (H) 02/08/2017   HGB 9.1 (L) 02/08/2017   HCT 30.4 (L) 02/08/2017   MCV 95.0 02/08/2017   PLT 185 02/08/2017   Lab Results  Component Value Date   CREATININE 4.23 (H) 02/09/2017   Lab Results  Component Value Date   INR 1.26 01/24/2017   CBG (last 3)   Recent Labs  02/08/17 2019 02/08/17 2325 02/09/17 0346  GLUCAP 108* 86 92    PROBLEM LIST:    Principal Problem:   Cardiogenic shock ([place]) Active Problems:   Essential hypertension   Obstructive sleep apnea   Morbid obesity ([place])   Obesity hypoventilation syndrome (HCC)   COPD mixed type (HCC)   Chronic respiratory failure with hypoxia (HCC)   Acute kidney injury superimposed on CKD ([place])   Acute on chronic combined systolic and diastolic CHF (congestive heart failure) (HCC)   Thoracic aortic atherosclerosis (HCC)   Cardiorenal syndrome   VRE bacteremia   PAH (pulmonary artery hypertension) (HCC)   Chronic atrial fibrillation (HCC)   Left-sided nontraumatic intracerebral hemorrhage ([place])   Encounter for central line placement   SDH (subdural hematoma) (HCC)   Hygroma   Thrombocytopenia (HCC)   DIC (disseminated intravascular coagulation) (HCC)   HIT (heparin-induced thrombocytopenia) (HCC)   Abdominal pain   CURRENT  MEDS:   . amiodarone  400 mg Oral BID  . darbepoetin (ARANESP) injection - NON-DIALYSIS  150 mcg Subcutaneous Q Mon-1800  . feeding supplement (NEPRO CARB STEADY)  237 mL Oral BID BM  . heparin subcutaneous  5,000 Units Subcutaneous Q8H  . hydrocortisone sod succinate (SOLU-CORTEF) inj  12.5 mg Intravenous Q12H  . insulin aspart  2-6 Units Subcutaneous Q4H  . lanthanum  1,000 mg Oral TID WC  . mouth rinse  15 mL Mouth Rinse BID  . midodrine  15 mg Oral TID WC  . pantoprazole  40 mg Oral Daily    Gae Gallop Beeper: [phone] Office: [phone] 02/09/2017

## 2017-02-09 NOTE — Progress Notes (Signed)
Internal Medicine Attending:   I saw and examined the patient. I reviewed the resident's note and I agree with the resident's findings and plan as documented in the resident's note.  Patient is doing well after HD session. She is sitting up at the side of the bed, trying to get comfortable, hungry, no other complaints. Heart rate is a fib at 130, stable BP and no symptoms. Cardiology has ordered Ivabradine to start today, not yet received because of HD schedule.  I think she will be ready for LTACH transfer in the next 1-2 days when bed becomes available.

## 2017-02-09 NOTE — Progress Notes (Addendum)
I received a call that the patient was in afib with heart rates 120-130. When I arrived in the room she was resting comfortably, she denied chest pain or shortness of breath. She was not in distress, tele showed an irregular rhythm with HR 127 and BP 97/70 on telemetry, BP was 124/70 when I rechecked it with a manual cuff. She has been on oral amiodarone (dose increased to 400 mg BID today) and is s/p cardioversion x 2 for afib RVR.   Ordered one time dose of metoprolol 12.5 mg   Will monitor BP and HR closely

## 2017-02-10 ENCOUNTER — Inpatient Hospital Stay (HOSPITAL_COMMUNITY): Payer: Medicare Other

## 2017-02-10 DIAGNOSIS — A0472 Enterocolitis due to Clostridium difficile, not specified as recurrent: Secondary | ICD-10-CM | POA: Diagnosis not present

## 2017-02-10 DIAGNOSIS — I130 Hypertensive heart and chronic kidney disease with heart failure and stage 1 through stage 4 chronic kidney disease, or unspecified chronic kidney disease: Secondary | ICD-10-CM | POA: Diagnosis not present

## 2017-02-10 DIAGNOSIS — I509 Heart failure, unspecified: Secondary | ICD-10-CM

## 2017-02-10 DIAGNOSIS — R918 Other nonspecific abnormal finding of lung field: Secondary | ICD-10-CM | POA: Diagnosis not present

## 2017-02-10 DIAGNOSIS — E872 Acidosis: Secondary | ICD-10-CM | POA: Diagnosis present

## 2017-02-10 DIAGNOSIS — D7582 Heparin induced thrombocytopenia (HIT): Secondary | ICD-10-CM | POA: Diagnosis not present

## 2017-02-10 DIAGNOSIS — I2510 Atherosclerotic heart disease of native coronary artery without angina pectoris: Secondary | ICD-10-CM | POA: Diagnosis not present

## 2017-02-10 DIAGNOSIS — E161 Other hypoglycemia: Secondary | ICD-10-CM | POA: Diagnosis not present

## 2017-02-10 DIAGNOSIS — J9621 Acute and chronic respiratory failure with hypoxia: Secondary | ICD-10-CM | POA: Diagnosis not present

## 2017-02-10 DIAGNOSIS — J9622 Acute and chronic respiratory failure with hypercapnia: Secondary | ICD-10-CM | POA: Diagnosis not present

## 2017-02-10 DIAGNOSIS — Y95 Nosocomial condition: Secondary | ICD-10-CM | POA: Diagnosis not present

## 2017-02-10 DIAGNOSIS — M6281 Muscle weakness (generalized): Secondary | ICD-10-CM | POA: Diagnosis not present

## 2017-02-10 DIAGNOSIS — I4891 Unspecified atrial fibrillation: Secondary | ICD-10-CM | POA: Diagnosis not present

## 2017-02-10 DIAGNOSIS — I110 Hypertensive heart disease with heart failure: Secondary | ICD-10-CM | POA: Diagnosis not present

## 2017-02-10 DIAGNOSIS — I480 Paroxysmal atrial fibrillation: Secondary | ICD-10-CM | POA: Diagnosis not present

## 2017-02-10 DIAGNOSIS — I9589 Other hypotension: Secondary | ICD-10-CM

## 2017-02-10 DIAGNOSIS — R6521 Severe sepsis with septic shock: Secondary | ICD-10-CM | POA: Diagnosis not present

## 2017-02-10 DIAGNOSIS — I70262 Atherosclerosis of native arteries of extremities with gangrene, left leg: Secondary | ICD-10-CM | POA: Diagnosis not present

## 2017-02-10 DIAGNOSIS — J9690 Respiratory failure, unspecified, unspecified whether with hypoxia or hypercapnia: Secondary | ICD-10-CM | POA: Diagnosis not present

## 2017-02-10 DIAGNOSIS — R4701 Aphasia: Secondary | ICD-10-CM | POA: Diagnosis not present

## 2017-02-10 DIAGNOSIS — E11649 Type 2 diabetes mellitus with hypoglycemia without coma: Secondary | ICD-10-CM | POA: Diagnosis not present

## 2017-02-10 DIAGNOSIS — Z8673 Personal history of transient ischemic attack (TIA), and cerebral infarction without residual deficits: Secondary | ICD-10-CM | POA: Diagnosis not present

## 2017-02-10 DIAGNOSIS — N179 Acute kidney failure, unspecified: Secondary | ICD-10-CM | POA: Diagnosis not present

## 2017-02-10 DIAGNOSIS — E875 Hyperkalemia: Secondary | ICD-10-CM | POA: Diagnosis not present

## 2017-02-10 DIAGNOSIS — I96 Gangrene, not elsewhere classified: Secondary | ICD-10-CM | POA: Diagnosis not present

## 2017-02-10 DIAGNOSIS — E46 Unspecified protein-calorie malnutrition: Secondary | ICD-10-CM | POA: Diagnosis not present

## 2017-02-10 DIAGNOSIS — I959 Hypotension, unspecified: Secondary | ICD-10-CM | POA: Diagnosis not present

## 2017-02-10 DIAGNOSIS — Z87891 Personal history of nicotine dependence: Secondary | ICD-10-CM | POA: Diagnosis not present

## 2017-02-10 DIAGNOSIS — R570 Cardiogenic shock: Secondary | ICD-10-CM | POA: Diagnosis not present

## 2017-02-10 DIAGNOSIS — Z789 Other specified health status: Secondary | ICD-10-CM | POA: Diagnosis not present

## 2017-02-10 DIAGNOSIS — N185 Chronic kidney disease, stage 5: Secondary | ICD-10-CM | POA: Diagnosis not present

## 2017-02-10 DIAGNOSIS — I611 Nontraumatic intracerebral hemorrhage in hemisphere, cortical: Secondary | ICD-10-CM | POA: Diagnosis not present

## 2017-02-10 DIAGNOSIS — I998 Other disorder of circulatory system: Secondary | ICD-10-CM | POA: Diagnosis not present

## 2017-02-10 DIAGNOSIS — I429 Cardiomyopathy, unspecified: Secondary | ICD-10-CM | POA: Diagnosis not present

## 2017-02-10 DIAGNOSIS — E2740 Unspecified adrenocortical insufficiency: Secondary | ICD-10-CM | POA: Diagnosis present

## 2017-02-10 DIAGNOSIS — N186 End stage renal disease: Secondary | ICD-10-CM | POA: Diagnosis not present

## 2017-02-10 DIAGNOSIS — L97109 Non-pressure chronic ulcer of unspecified thigh with unspecified severity: Secondary | ICD-10-CM | POA: Diagnosis present

## 2017-02-10 DIAGNOSIS — Z6841 Body Mass Index (BMI) 40.0 and over, adult: Secondary | ICD-10-CM | POA: Diagnosis not present

## 2017-02-10 DIAGNOSIS — E8770 Fluid overload, unspecified: Secondary | ICD-10-CM | POA: Diagnosis not present

## 2017-02-10 DIAGNOSIS — A419 Sepsis, unspecified organism: Secondary | ICD-10-CM | POA: Diagnosis not present

## 2017-02-10 DIAGNOSIS — J449 Chronic obstructive pulmonary disease, unspecified: Secondary | ICD-10-CM | POA: Diagnosis not present

## 2017-02-10 DIAGNOSIS — J90 Pleural effusion, not elsewhere classified: Secondary | ICD-10-CM | POA: Diagnosis not present

## 2017-02-10 DIAGNOSIS — D631 Anemia in chronic kidney disease: Secondary | ICD-10-CM | POA: Diagnosis not present

## 2017-02-10 DIAGNOSIS — F0631 Mood disorder due to known physiological condition with depressive features: Secondary | ICD-10-CM | POA: Diagnosis not present

## 2017-02-10 DIAGNOSIS — I5043 Acute on chronic combined systolic (congestive) and diastolic (congestive) heart failure: Secondary | ICD-10-CM | POA: Diagnosis not present

## 2017-02-10 DIAGNOSIS — E878 Other disorders of electrolyte and fluid balance, not elsewhere classified: Secondary | ICD-10-CM | POA: Diagnosis not present

## 2017-02-10 DIAGNOSIS — E440 Moderate protein-calorie malnutrition: Secondary | ICD-10-CM | POA: Diagnosis not present

## 2017-02-10 DIAGNOSIS — R600 Localized edema: Secondary | ICD-10-CM | POA: Diagnosis not present

## 2017-02-10 DIAGNOSIS — J9600 Acute respiratory failure, unspecified whether with hypoxia or hypercapnia: Secondary | ICD-10-CM | POA: Diagnosis not present

## 2017-02-10 DIAGNOSIS — E1122 Type 2 diabetes mellitus with diabetic chronic kidney disease: Secondary | ICD-10-CM | POA: Diagnosis not present

## 2017-02-10 DIAGNOSIS — I428 Other cardiomyopathies: Secondary | ICD-10-CM | POA: Diagnosis not present

## 2017-02-10 DIAGNOSIS — R197 Diarrhea, unspecified: Secondary | ICD-10-CM | POA: Diagnosis not present

## 2017-02-10 DIAGNOSIS — I482 Chronic atrial fibrillation: Secondary | ICD-10-CM | POA: Diagnosis not present

## 2017-02-10 DIAGNOSIS — F419 Anxiety disorder, unspecified: Secondary | ICD-10-CM | POA: Diagnosis not present

## 2017-02-10 DIAGNOSIS — I481 Persistent atrial fibrillation: Secondary | ICD-10-CM | POA: Diagnosis not present

## 2017-02-10 DIAGNOSIS — E2749 Other adrenocortical insufficiency: Secondary | ICD-10-CM | POA: Diagnosis not present

## 2017-02-10 DIAGNOSIS — J189 Pneumonia, unspecified organism: Secondary | ICD-10-CM | POA: Diagnosis not present

## 2017-02-10 DIAGNOSIS — R579 Shock, unspecified: Secondary | ICD-10-CM | POA: Diagnosis not present

## 2017-02-10 DIAGNOSIS — D649 Anemia, unspecified: Secondary | ICD-10-CM | POA: Diagnosis not present

## 2017-02-10 DIAGNOSIS — N2581 Secondary hyperparathyroidism of renal origin: Secondary | ICD-10-CM | POA: Diagnosis not present

## 2017-02-10 DIAGNOSIS — A6004 Herpesviral vulvovaginitis: Secondary | ICD-10-CM | POA: Diagnosis not present

## 2017-02-10 DIAGNOSIS — I629 Nontraumatic intracranial hemorrhage, unspecified: Secondary | ICD-10-CM | POA: Diagnosis not present

## 2017-02-10 DIAGNOSIS — I5084 End stage heart failure: Secondary | ICD-10-CM | POA: Diagnosis not present

## 2017-02-10 DIAGNOSIS — I132 Hypertensive heart and chronic kidney disease with heart failure and with stage 5 chronic kidney disease, or end stage renal disease: Secondary | ICD-10-CM | POA: Diagnosis present

## 2017-02-10 DIAGNOSIS — R112 Nausea with vomiting, unspecified: Secondary | ICD-10-CM | POA: Diagnosis not present

## 2017-02-10 DIAGNOSIS — R5381 Other malaise: Secondary | ICD-10-CM | POA: Diagnosis not present

## 2017-02-10 DIAGNOSIS — K6389 Other specified diseases of intestine: Secondary | ICD-10-CM | POA: Diagnosis not present

## 2017-02-10 DIAGNOSIS — D65 Disseminated intravascular coagulation [defibrination syndrome]: Secondary | ICD-10-CM | POA: Diagnosis not present

## 2017-02-10 DIAGNOSIS — Z4659 Encounter for fitting and adjustment of other gastrointestinal appliance and device: Secondary | ICD-10-CM | POA: Diagnosis not present

## 2017-02-10 DIAGNOSIS — A4181 Sepsis due to Enterococcus: Secondary | ICD-10-CM | POA: Diagnosis not present

## 2017-02-10 DIAGNOSIS — I472 Ventricular tachycardia: Secondary | ICD-10-CM | POA: Diagnosis not present

## 2017-02-10 DIAGNOSIS — R4020 Unspecified coma: Secondary | ICD-10-CM | POA: Diagnosis not present

## 2017-02-10 DIAGNOSIS — Z452 Encounter for adjustment and management of vascular access device: Secondary | ICD-10-CM | POA: Diagnosis not present

## 2017-02-10 DIAGNOSIS — J9620 Acute and chronic respiratory failure, unspecified whether with hypoxia or hypercapnia: Secondary | ICD-10-CM | POA: Diagnosis not present

## 2017-02-10 DIAGNOSIS — I5042 Chronic combined systolic (congestive) and diastolic (congestive) heart failure: Secondary | ICD-10-CM | POA: Diagnosis present

## 2017-02-10 DIAGNOSIS — G936 Cerebral edema: Secondary | ICD-10-CM | POA: Diagnosis not present

## 2017-02-10 DIAGNOSIS — Z992 Dependence on renal dialysis: Secondary | ICD-10-CM | POA: Diagnosis not present

## 2017-02-10 DIAGNOSIS — I248 Other forms of acute ischemic heart disease: Secondary | ICD-10-CM | POA: Diagnosis not present

## 2017-02-10 DIAGNOSIS — E1152 Type 2 diabetes mellitus with diabetic peripheral angiopathy with gangrene: Secondary | ICD-10-CM | POA: Diagnosis not present

## 2017-02-10 DIAGNOSIS — J440 Chronic obstructive pulmonary disease with acute lower respiratory infection: Secondary | ICD-10-CM | POA: Diagnosis not present

## 2017-02-10 DIAGNOSIS — D696 Thrombocytopenia, unspecified: Secondary | ICD-10-CM | POA: Diagnosis not present

## 2017-02-10 DIAGNOSIS — G4733 Obstructive sleep apnea (adult) (pediatric): Secondary | ICD-10-CM | POA: Diagnosis not present

## 2017-02-10 DIAGNOSIS — N183 Chronic kidney disease, stage 3 (moderate): Secondary | ICD-10-CM | POA: Diagnosis not present

## 2017-02-10 DIAGNOSIS — R7989 Other specified abnormal findings of blood chemistry: Secondary | ICD-10-CM | POA: Diagnosis not present

## 2017-02-10 DIAGNOSIS — I484 Atypical atrial flutter: Secondary | ICD-10-CM | POA: Diagnosis not present

## 2017-02-10 DIAGNOSIS — Z9911 Dependence on respirator [ventilator] status: Secondary | ICD-10-CM | POA: Diagnosis not present

## 2017-02-10 DIAGNOSIS — R7881 Bacteremia: Secondary | ICD-10-CM | POA: Diagnosis not present

## 2017-02-10 DIAGNOSIS — Z4682 Encounter for fitting and adjustment of non-vascular catheter: Secondary | ICD-10-CM | POA: Diagnosis not present

## 2017-02-10 DIAGNOSIS — R2689 Other abnormalities of gait and mobility: Secondary | ICD-10-CM | POA: Diagnosis not present

## 2017-02-10 DIAGNOSIS — I5040 Unspecified combined systolic (congestive) and diastolic (congestive) heart failure: Secondary | ICD-10-CM | POA: Diagnosis not present

## 2017-02-10 DIAGNOSIS — E662 Morbid (severe) obesity with alveolar hypoventilation: Secondary | ICD-10-CM | POA: Diagnosis present

## 2017-02-10 DIAGNOSIS — G9341 Metabolic encephalopathy: Secondary | ICD-10-CM | POA: Diagnosis present

## 2017-02-10 LAB — ECHOCARDIOGRAM LIMITED
Height: 68 in
WEIGHTICAEL: 4352 [oz_av]

## 2017-02-10 LAB — CBC
HCT: 30.9 % — ABNORMAL LOW (ref 36.0–46.0)
Hemoglobin: 8.8 g/dL — ABNORMAL LOW (ref 12.0–15.0)
MCH: 27.3 pg (ref 26.0–34.0)
MCHC: 28.5 g/dL — ABNORMAL LOW (ref 30.0–36.0)
MCV: 96 fL (ref 78.0–100.0)
PLATELETS: 220 10*3/uL (ref 150–400)
RBC: 3.22 MIL/uL — AB (ref 3.87–5.11)
RDW: 34 % — ABNORMAL HIGH (ref 11.5–15.5)
WBC: 13.5 10*3/uL — AB (ref 4.0–10.5)

## 2017-02-10 LAB — GLUCOSE, CAPILLARY
GLUCOSE-CAPILLARY: 66 mg/dL (ref 65–99)
GLUCOSE-CAPILLARY: 75 mg/dL (ref 65–99)
GLUCOSE-CAPILLARY: 92 mg/dL (ref 65–99)
Glucose-Capillary: 64 mg/dL — ABNORMAL LOW (ref 65–99)
Glucose-Capillary: 65 mg/dL (ref 65–99)
Glucose-Capillary: 67 mg/dL (ref 65–99)
Glucose-Capillary: 95 mg/dL (ref 65–99)

## 2017-02-10 LAB — RENAL FUNCTION PANEL
ALBUMIN: 2.4 g/dL — AB (ref 3.5–5.0)
Anion gap: 21 — ABNORMAL HIGH (ref 5–15)
BUN: 23 mg/dL — AB (ref 6–20)
CO2: 18 mmol/L — ABNORMAL LOW (ref 22–32)
CREATININE: 3.34 mg/dL — AB (ref 0.44–1.00)
Calcium: 8.3 mg/dL — ABNORMAL LOW (ref 8.9–10.3)
Chloride: 95 mmol/L — ABNORMAL LOW (ref 101–111)
GFR, EST AFRICAN AMERICAN: 16 mL/min — AB (ref >60)
GFR, EST NON AFRICAN AMERICAN: 14 mL/min — AB (ref >60)
Glucose, Bld: 98 mg/dL (ref 65–99)
PHOSPHORUS: 6 mg/dL — AB (ref 2.5–4.6)
Potassium: 3.8 mmol/L (ref 3.5–5.1)
Sodium: 134 mmol/L — ABNORMAL LOW (ref 135–145)

## 2017-02-10 MED ORDER — AMIODARONE HCL 400 MG PO TABS: 400.0000 mg | ORAL_TABLET | Freq: Two times a day (BID) | ORAL | 0 refills | Status: AC

## 2017-02-10 MED ORDER — IVABRADINE HCL 5 MG PO TABS
5.0000 mg | ORAL_TABLET | Freq: Two times a day (BID) | ORAL | Status: DC
  Administered 2017-02-10: 5 mg via ORAL
  Filled 2017-02-10 (×3): qty 1

## 2017-02-10 MED ORDER — OXYCODONE-ACETAMINOPHEN 5-325 MG PO TABS: 1.0000 | ORAL_TABLET | ORAL | 0 refills | Status: AC | PRN

## 2017-02-10 MED ORDER — LANTHANUM CARBONATE 1000 MG PO CHEW: 1000.0000 mg | CHEWABLE_TABLET | Freq: Three times a day (TID) | ORAL | 0 refills | Status: AC

## 2017-02-10 MED ORDER — GLUCOSE 4 G PO CHEW
4.0000 | CHEWABLE_TABLET | Freq: Once | ORAL | Status: AC
  Administered 2017-02-10: 16 g via ORAL
  Filled 2017-02-10: qty 4

## 2017-02-10 MED ORDER — ASPIRIN EC 81 MG PO TBEC
81.0000 mg | DELAYED_RELEASE_TABLET | Freq: Every day | ORAL | Status: DC
  Administered 2017-02-10: 81 mg via ORAL

## 2017-02-10 MED ORDER — MIDODRINE HCL 5 MG PO TABS: 15.0000 mg | ORAL_TABLET | Freq: Three times a day (TID) | ORAL | 0 refills | Status: AC

## 2017-02-10 MED ORDER — DARBEPOETIN ALFA 150 MCG/0.3ML IJ SOSY: 150.0000 ug | PREFILLED_SYRINGE | INTRAMUSCULAR | 0 refills | Status: AC

## 2017-02-10 MED ORDER — INSULIN ASPART 100 UNIT/ML ~~LOC~~ SOLN: 0.0000 [IU] | Freq: Three times a day (TID) | SUBCUTANEOUS | Status: DC

## 2017-02-10 MED ORDER — GERHARDT'S BUTT CREAM: 1.0000 "application " | TOPICAL_CREAM | Freq: Every day | CUTANEOUS | 0 refills | Status: DC

## 2017-02-10 MED ORDER — ASPIRIN 81 MG PO TBEC: 81.0000 mg | DELAYED_RELEASE_TABLET | Freq: Every day | ORAL | 0 refills | Status: DC

## 2017-02-10 MED ORDER — INSULIN ASPART 100 UNIT/ML ~~LOC~~ SOLN: 0.0000 [IU] | Freq: Three times a day (TID) | SUBCUTANEOUS | 11 refills | Status: AC

## 2017-02-10 MED ORDER — IVABRADINE HCL 5 MG PO TABS: 5.0000 mg | ORAL_TABLET | Freq: Two times a day (BID) | ORAL | 0 refills | Status: AC

## 2017-02-10 MED ORDER — NEPRO/CARBSTEADY PO LIQD: 237.0000 mL | Freq: Two times a day (BID) | ORAL | 0 refills | Status: DC

## 2017-02-10 NOTE — Progress Notes (Signed)
RN spoke with Dr. Eula Fried. Morning Amio given late due to HD. SBP currently trending in the 90's. MD would like PM dose this evening to be spaced between time given and when next dose is due.

## 2017-02-10 NOTE — Plan of Care (Signed)
Problem: Education: Goal: Knowledge of Paige Marshall General Education information/materials will improve Outcome: Progressing Patient educated on safety

## 2017-02-10 NOTE — Progress Notes (Signed)
[facility] Progress Note    Assessment/ Plan:   Anuric AKI on CKD 4, now likely ESRD: due to persistent shock.  - on MWF HD, Tolerating well without limiting hypotension - s/p AVG placement VVS [place] 02/08/17; tunnel dialysis catheter functioning well - Patient stable for discharge to LTAC from renal point of view  Hyperphosphatemia: Fosrenol 1g 3 times a day  Anemia: s/p 2 units pRBCs 4/22. Hgb stable. - Aranesp q Monday  Atrial Fibrillation: continues to be in A-fib.  - HF team following - PO Amiodarone - No anticoagulation for recent SDH  Combined systolic and diastolic HF: Echo on 14/78/2956 with EF of 35-40%, mild LVH, eccentric hypertrophy of LV, mod dilated LA and severely dilated RV with signs volume overload and PAPP of 71.  - HF team following  Persistent shock s/p VRE bacteremia- resolved. BPs much better. - On Midodrine 15mg  tid - Weaning stress dose steroids - Completed abx 01/26/17, repeat blood cultures 4/19 NGF  SDH: stable - Neurosurgery note reviewed - Avoid heparin with HD  Subjective:    Tolerated dialysis yesterday with 3 L ultrafiltration and no limiting hypotension Potentially to LTAC today    Objective:   BP 91/67 (BP Location: Left Leg)   Pulse (!) 120   Temp 97.8 F (36.6 C) (Oral)   Resp (!) 26   Ht 5\' 8"  (1.727 m)   Wt 123.4 kg (272 lb)   LMP 09/07/2011   SpO2 98%   BMI 41.36 kg/m   Intake/Output Summary (Last 24 hours) at 02/10/17 1138 Last data filed at 02/10/17 1000  Gross per 24 hour  Intake           932.17 ml  Output             3000 ml  Net         -2067.83 ml   Weight change:   Physical Exam: Gen: laying in bed, in NAD CVS: Irregularly irregular rhythm, tachycardic, no murmurs Resp: rhonchi present, improves after cough; normal work of breathing. Abd: obese, soft, non-tender, no rebound, no guarding Ext: 1+ pitting edema in the lower extremities bilaterally ACCESS: R TDC in place  Imaging: No  results found.  Labs: BMET  Recent Labs Lab 02/04/17 0550 02/05/17 0515 02/06/17 0459 02/06/17 0502 02/07/17 0530 02/08/17 0425 02/09/17 0502 02/10/17 0532  NA 138 138 136  --  137 139 137 134*  K 4.2 3.5 4.9  --  4.9 3.5 4.1 3.8  CL 99* 101 98*  --  97* 102 100* 95*  CO2 24 26 20*  --  19* 26 23 18*  GLUCOSE 90 81 88  --  78 79 94 98  BUN 61* 26* 44*  --  66* 26* 36* 23*  CREATININE 4.17* 2.54* 3.81*  --  4.99* 3.06* 4.23* 3.34*  CALCIUM 8.7* 8.2* 8.8*  --  8.9 8.4* 8.4* 8.3*  PHOS 7.3* 4.0  --  8.0*  --   --  5.8* 6.0*   CBC  Recent Labs Lab 02/04/17 0550 02/05/17 0515 02/06/17 0459 02/07/17 0830 02/08/17 0425 02/09/17 1457 02/10/17 0534  WBC 13.8* 13.8* 13.5* 18.6* 18.6* 16.6* 13.5*  NEUTROABS 11.5* 11.4* 9.7*  --   --   --   --   HGB 8.0* 8.3* 8.3* 8.9* 9.1* 9.0* 8.8*  HCT 27.3* 28.7* 28.6* 29.6* 30.4* 30.6* 30.9*  MCV 92.9 93.5 93.5 93.4 95.0 94.4 96.0  PLT 170 174 183 188 185 203 220  Medications:    . amiodarone  400 mg Oral BID  . aspirin EC  81 mg Oral Daily  . darbepoetin (ARANESP) injection - NON-DIALYSIS  150 mcg Subcutaneous Q Mon-1800  . feeding supplement (NEPRO CARB STEADY)  237 mL Oral BID BM  . heparin subcutaneous  5,000 Units Subcutaneous Q8H  . insulin aspart  0-9 Units Subcutaneous TID WC  . ivabradine  5 mg Oral BID WC  . lanthanum  1,000 mg Oral TID WC  . mouth rinse  15 mL Mouth Rinse BID  . midodrine  15 mg Oral TID WC  . pantoprazole  40 mg Oral Daily    Hyman Bible, MD PGY-2 Family Medicine Resident 02/10/17  11:38 AM

## 2017-02-10 NOTE — Progress Notes (Addendum)
Occupational Therapy Treatment Patient Details Name: Paige Marshall MRN: [id number] DOB: [birth year] Today's Date: 02/10/2017    History of present illness 61 y.o. female with morbid obesity / wheelchair bound, untreated OHS, chronic combined systolic / diastolic CHF, NICM, AF on coumadin, HTN, 3L O2 dependent, pulmonary hypertension, COPD & CKD III.  The patient suffered a left fibular fracture in late 2017 and has been at [facility] since. Admitted 12/26/16 from SNF with acute CHF. Pt started on CVVHD that was then discontinued for HD. 3/31 pt developed septic shock and CVVHD restarted.[name] CVVHD discontinued 4/9.  Restarted 4/14 and transition to HD 4/25 with discontinuation of pressors.  Had DCCV third time to NSR 4/12 then AMS and CT positive L frontal ICH and MRI noted parafalcine SDH trace R midline shift.  Reverted back to a-fib 4/16.   OT comments  Pt seen today for acute OT for ADL retraining session with focus on sitting EOB, activity tolerance, endurance, grooming and bed mobility in preparation for increased participation in ADL and self care tasks. Pt reports that she plans to d/c to SNF Rehab later today and is looking forward to "doing more and getting better".   Follow Up Recommendations  SNF;Supervision/Assistance - 24 hour    Equipment Recommendations  Other (comment) (Defer to next venue)    Recommendations for Other Services      Precautions / Restrictions Precautions Precautions: Fall Precaution Comments: watch vitals Restrictions Weight Bearing Restrictions: No       Mobility Bed Mobility Overal bed mobility: Needs Assistance Bed Mobility: Sit to Supine       Sit to supine: +2 for physical assistance;Mod assist   General bed mobility comments: Assist to bring legs back up into bed and reposition in bed  Transfers                      Balance   Sitting-balance support: No upper extremity supported;Feet supported Sitting balance-Leahy Scale:  Fair Sitting balance - Comments: Sat EOB for ~15 min during ADL retraining session today while performing simple grooming tasks.                                    ADL either performed or assessed with clinical judgement   ADL Overall ADL's : Needs assistance/impaired     Grooming: Wash/dry hands;Wash/dry face;Oral care;Sitting;Set up Grooming Details (indicate cue type and reason): Increased time for tasks. Pt takes rest breaks PRN                               General ADL Comments: Pt had just completed PT upon OT arrival. She was agreeable to ADL retraining session today to include activtiy tolerance, sitting EOB, grooming & bed mobility in anticipation of increased participation with ADL and self care tasks. Pt was supervision level for sitting EOB ~82min and was noted to take multiple rest breaks PRN during grooming tasks. Pt reports that she plans to d/c to SNF Rehab later today and appears excited to cont to progress with therapy.     Vision  Wears glasses for reading. No change from baseline.     Perception     Praxis      Cognition Arousal/Alertness: Awake/alert Behavior During Therapy: WFL for tasks assessed/performed Overall Cognitive Status: Within Functional Limits for tasks assessed  Exercises     Shoulder Instructions       General Comments      Pertinent Vitals/ Pain       Pain Assessment: No/denies pain  Home Living                                          Prior Functioning/Environment              Frequency  Min 2X/week        Progress Toward Goals  OT Goals(current goals can now be found in the care plan section)  Progress towards OT goals: Progressing toward goals  Acute Rehab OT Goals Patient Stated Goal: Go to SNF Rehab. "Do more, keep getting better"  Plan Discharge plan remains appropriate    Co-evaluation                  AM-PAC PT "6 Clicks" Daily Activity     Outcome Measure   Help from another person eating meals?: A Little Help from another person taking care of personal grooming?: A Little Help from another person toileting, which includes using toliet, bedpan, or urinal?: Total Help from another person bathing (including washing, rinsing, drying)?: A Lot Help from another person to put on and taking off regular upper body clothing?: A Lot Help from another person to put on and taking off regular lower body clothing?: Total 6 Click Score: 12    End of Session    OT Visit Diagnosis: Muscle weakness (generalized) (M62.81);Pain;Other symptoms and signs involving cognitive function;Hemiplegia and hemiparesis Hemiplegia - Right/Left: Left Hemiplegia - dominant/non-dominant: Non-Dominant Hemiplegia - caused by: Nontraumatic intracerebral hemorrhage   Activity Tolerance Patient tolerated treatment well;Patient limited by fatigue   Patient Left in bed;with call bell/phone within reach   Nurse Communication          Time: 3729-0211 OT Time Calculation (min): 18 min  Charges: OT General Charges $OT Visit: 1 Procedure OT Treatments $Self Care/Home Management : 8-22 mins  [name], OTR/L 02/10/17 11:09 AM    [name] [name] 02/10/2017, 11:08 AM

## 2017-02-10 NOTE — Progress Notes (Signed)
Patient's BP trending low automatic/manual, appears to be asymptomatic. Resident up, retook BP manually Systolic 383. MD advised to keep monitoring and OK to give Amio.

## 2017-02-10 NOTE — Progress Notes (Addendum)
Advanced Heart Failure Rounding Note  PCP: Juluis Mire, NP  Primary Cardiologist: Dr. Gwenlyn Found   Subjective:    BP slightly low after HD but better this am. Feels good. Blood sugar low last night. HR improved some on Corlanor. HR 105-120   Objective:   Weight Range: 123.4 kg (272 lb) Body mass index is 41.36 kg/m.   Vital Signs:   Temp:  [97.8 F (36.6 C)-98.4 F (36.9 C)] 98.1 F (36.7 C) (05/03 0300) Pulse Rate:  [83-128] 109 (05/03 0300) Resp:  [17-28] 20 (05/03 0300) BP: (70-132)/(41-106) 93/59 (05/03 0300) SpO2:  [97 %-100 %] 97 % (05/03 0300) Weight:  [123.4 kg (272 lb)-125.6 kg (277 lb)] 123.4 kg (272 lb) (05/03 0300) Last BM Date: 02/09/17  Weight change: Filed Weights   02/09/17 1000 02/09/17 1400 02/10/17 0300  Weight: 125.6 kg (277 lb) 123.4 kg (272 lb) 123.4 kg (272 lb)    Intake/Output:   Intake/Output Summary (Last 24 hours) at 02/10/17 0657 Last data filed at 02/10/17 0600  Gross per 24 hour  Intake           532.17 ml  Output             3000 ml  Net         -2467.83 ml     Physical Exam: General:  lying in bed . No resp difficulty HEENT: normal Neck: supple. no JVD. Carotids 2+ bilat; no bruits. No lymphadenopathy or thryomegaly appreciated. Cor: PMI nonpalpable IRREG tachy No rubs, gallops or murmurs. Lungs: clear Abdomen: obsese soft, nontender, nondistended. No hepatosplenomegaly. No bruits or masses. Good bowel sounds. Extremities: no cyanosis, clubbing, rash, edema Neuro: alert & orientedx3, cranial nerves grossly intact. moves all 4 extremities w/o difficulty. Affect pleasant   Telemetry:  Afib 105-120  Personally reviewed    Labs: CBC  Recent Labs  02/09/17 1457 02/10/17 0534  WBC 16.6* 13.5*  HGB 9.0* 8.8*  HCT 30.6* 30.9*  MCV 94.4 96.0  PLT 203 732   Basic Metabolic Panel  Recent Labs  02/09/17 0502 02/10/17 0532  NA 137 134*  K 4.1 3.8  CL 100* 95*  CO2 23 18*  GLUCOSE 94 98  BUN 36* 23*    CREATININE 4.23* 3.34*  CALCIUM 8.4* 8.3*  PHOS 5.8* 6.0*   Liver Function Tests  Recent Labs  02/09/17 0502 02/10/17 0532  ALBUMIN 2.4* 2.4*    BNP: BNP (last 3 results)  Recent Labs  09/18/16 1126 11/07/16 1633 12/26/16 1643  BNP 991.0* 490.4* 1,116.0*    Transthoracic Echocardiography 01/11/17 Study Conclusions  - Left ventricle: The cavity size was mildly dilated. Wall   thickness was increased in a pattern of mild LVH. Left   ventricular geometry showed evidence of eccentric hypertrophy.   Systolic function was moderately reduced. The estimated ejection   fraction was in the range of 35% to 40%. Moderate diffuse   hypokinesis with no identifiable regional variations. Acoustic   contrast opacification revealed no evidence ofthrombus. - Ventricular septum: The contour showed diastolic flattening.   These changes are consistent with RV volume overload. - Aortic valve: There was mild to moderate regurgitation directed   centrally in the LVOT. - Mitral valve: There was mild to moderate regurgitation directed   eccentrically and posteriorly. - Left atrium: The atrium was moderately dilated. - Right ventricle: The cavity size was severely dilated. Wall   thickness was normal. Systolic function was mildly reduced. - Right atrium: The atrium  was severely dilated. - Tricuspid valve: There was malcoaptation of the valve leaflets.   There was moderate-severe regurgitation. - Pulmonary arteries: Systolic pressure was severely increased. PA   peak pressure: 71 mm Hg (S). - Pericardium, extracardiac: A trivial pericardial effusion was   identified posterior to the heart.    Medications:     Scheduled Medications: . amiodarone  400 mg Oral BID  . darbepoetin (ARANESP) injection - NON-DIALYSIS  150 mcg Subcutaneous Q Mon-1800  . feeding supplement (NEPRO CARB STEADY)  237 mL Oral BID BM  . heparin subcutaneous  5,000 Units Subcutaneous Q8H  . insulin aspart  2-6  Units Subcutaneous Q4H  . lanthanum  1,000 mg Oral TID WC  . mouth rinse  15 mL Mouth Rinse BID  . midodrine  15 mg Oral TID WC  . pantoprazole  40 mg Oral Daily    Infusions: . sodium chloride 10 mL/hr at 02/10/17 0547    PRN Medications: acetaminophen (TYLENOL) oral liquid 160 mg/5 mL, diphenhydrAMINE-zinc acetate, Gerhardt's butt cream, heparin, ondansetron (ZOFRAN) IV, oxyCODONE-acetaminophen, senna-docusate, simethicone   Assessment/Plan   1. Acute on chronic combined systolic and diastolic CHF: EF 20-10% on TEE 01/13/17 (down from 55% in March 2018 when first admitted). Likely tachy mediated from atrial fibrillation.  - She has biventricular failure R>>L.  - Volume status stable, tolerating iHD.   - Continue midodrine 15mg  TID.  2. Left frontal ICD with parafalcine SDH: Trace right midline shift. No CVA. Per Neuro her small ICH/SDH does not explain the degree of her AMS. Likely a component of metabolic encephalopathy due to renal failure. - Has right foot drop o/w stable - Neuro status stable.  - Seen by NSU. CT head with resolving ICH. Ok for DVT prophylaxis dose heparin  - Continue current medical management.  3. PAF: Failed DCCV 3/27, 4/5. Had DCCV with return to NSR on 01/20/17, went back into Afib 01/24/17.  -Rate remains elevated.  - No longer a candidate for DCCV as she is off anticoagulation due to [place].  - Amiodarone increased to 400mg  BID.  4. Acute on chronic kidney disease => ESRD - Tolerating iHD.  5. Obesity hypoventilation syndrome 6. Septic shock:   - Completed ABX  01/26/17.   - Repeat BCx NGTD. Afebrile.    - No change to current plan.  7. 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 .  - No change to current plan.  8. Acute respiratory failure: - Stable on [place] 02.  9. NSVT:  - Continue po Amio.    HR improved on amio and corlanor. Has not responded to b-blocker well in past.   Can go today. From cardiac perspective would send out on     amio 200 bid corlanor 5 bid Midodrine 15 tid  F/u in HF 2 weeks  Length of Stay: 32  Glori Bickers, MD  02/10/2017, 6:57 AM Advanced Heart Failure Team  Pager [phone] M-F 7am-4pm.  Please contact [facility] for night-coverage after hours (4p -7a ) and weekends on [email or web address]

## 2017-02-10 NOTE — Progress Notes (Signed)
Physical Therapy Treatment Patient Details Name: Paige Marshall MRN: [id number] DOB: 31-Aug-1956 Today's Date: 02/10/2017    History of Present Illness 61 y.o. female with morbid obesity / wheelchair bound, untreated OHS, chronic combined systolic / diastolic CHF, NICM, AF on coumadin, HTN, 3L O2 dependent, pulmonary hypertension, COPD & CKD III.  The patient suffered a left fibular fracture in late 2017 and has been at [facility] since. Admitted 12/26/16 from SNF with acute CHF. Pt started on CVVHD that was then discontinued for HD. 3/31 pt developed septic shock and CVVHD restarted.[name] CVVHD discontinued 4/9.  Restarted 4/14 and transition to HD 4/25 with discontinuation of pressors.  Had DCCV third time to NSR 4/12 then AMS and CT positive L frontal ICH and MRI noted parafalcine SDH trace R midline shift.  Reverted back to a-fib 4/16.    PT Comments    Pt continues to make steady progress with mobility. Remains very motivated to participate in therapy.   Follow Up Recommendations  LTACH     Equipment Recommendations  Other (comment) (To be assessed)    Recommendations for Other Services       Precautions / Restrictions Precautions Precautions: Fall Precaution Comments: watch vitals Restrictions Weight Bearing Restrictions: No    Mobility  Bed Mobility Overal bed mobility: Needs Assistance Bed Mobility: Supine to Sit     Supine to sit: +2 for physical assistance;Min assist    General bed mobility comments: Assist to bring left leg off bed and to elevate trunk into standing  Transfers Overall transfer level: Needs assistance Equipment used: Ambulation equipment used Charlaine Dalton) Transfers: Sit to/from Stand Sit to Stand: +2 physical assistance;Mod assist         General transfer comment: Stood x 2 with Stedy from EOB. Used bed pad to assist to bring hips and trunk up. On first attempt pt able to bring hips up but with trunk and head remaining in flexion. On second attempt pt  able to achieve more upright position but unable to fully extend hips and trunk.  Ambulation/Gait                 Stairs            Wheelchair Mobility    Modified Rankin (Stroke Patients Only)       Balance Overall balance assessment: Needs assistance Sitting-balance support: No upper extremity supported;Feet supported Sitting balance-Leahy Scale: Good Sitting balance - Comments: Sat EOB x 15 minutes   Standing balance support: Bilateral upper extremity supported Standing balance-Leahy Scale: Zero Standing balance comment: +2 mod assist to stand with Charlaine Dalton                             Cognition Arousal/Alertness: Awake/alert Behavior During Therapy: WFL for tasks assessed/performed Overall Cognitive Status: Within Functional Limits for tasks assessed                                        Exercises      General Comments        Pertinent Vitals/Pain Pain Assessment: Faces Faces Pain Scale: Hurts little more Pain Location: bottom "raw" Pain Descriptors / Indicators: Discomfort;Tender;Sore Pain Intervention(s): Limited activity within patient's tolerance    Home Living                      Prior  Function            PT Goals (current goals can now be found in the care plan section) Acute Rehab PT Goals Patient Stated Goal: Go to SNF Rehab. "Do more, keep getting better" Progress towards PT goals: Progressing toward goals    Frequency    Min 3X/week      PT Plan Current plan remains appropriate    Co-evaluation              AM-PAC PT "6 Clicks" Daily Activity  Outcome Measure  Difficulty turning over in bed (including adjusting bedclothes, sheets and blankets)?: Total Difficulty moving from lying on back to sitting on the side of the bed? : Total Difficulty sitting down on and standing up from a chair with arms (e.g., wheelchair, bedside commode, etc,.)?: Total Help needed moving to and from a  bed to chair (including a wheelchair)?: Total Help needed walking in hospital room?: Total Help needed climbing 3-5 steps with a railing? : Total 6 Click Score: 6    End of Session Equipment Utilized During Treatment: Oxygen Activity Tolerance: Patient tolerated treatment well Patient left: in bed;with call bell/phone within reach;Other (comment) (Pt sitting EOB with OT present) Nurse Communication: Mobility status;Need for lift equipment PT Visit Diagnosis: Muscle weakness (generalized) (M62.81);Difficulty in walking, not elsewhere classified (R26.2)     Time: 0600-4599 PT Time Calculation (min) (ACUTE ONLY): 21 min  Charges:  $Therapeutic Activity: 8-22 mins                    G Codes:       [facility] PT [place] 02/10/2017, 2:27 PM

## 2017-02-10 NOTE — Progress Notes (Signed)
Patient hypoglycemic this evening. Hypoglycemia protocol followed. 4 oz juice given twice and then glucose tabs given once. CBG up to 75.

## 2017-02-10 NOTE — Progress Notes (Signed)
   Subjective:  [name] was seen and evaluated today at bedside. She denies any SOB or back pain. No new complaints and is hopeful for d/c today.   Objective: Vital signs in last 24 hours: Vitals:   02/09/17 2356 02/10/17 0020 02/10/17 0050 02/10/17 0300  BP: (!) 70/55 (!) 84/46 100/60 (!) 93/59  Pulse:    (!) 109  Resp:    20  Temp:    98.1 F (36.7 C)  TempSrc:    Oral  SpO2:    97%  Weight:    272 lb (123.4 kg)  Height:       Physical Exam  Cardiovascular:  Irregularly Irregular   Pulmonary/Chest: Effort normal. No respiratory distress. She has no wheezes.  Abdominal: Soft. She exhibits no distension. There is no tenderness.  Musculoskeletal:  AVG site clean.  Neurological: She is alert.  Skin: Skin is warm and dry.  Psychiatric: Mood and affect normal.   Consult Hx: VVS (AVF placement), nephrology, neurosurgery, oncology, neurology, PM&R, ID, EP, PCCM, HF  Assessment/Plan: Principal Problem:   Cardiogenic shock ([place])  Acute on chronic combined systolic and diastolic HF: Down 202 lbs. BP stable off pressors however continues on Midodrine 15 mg TID currently. Was a little hypotensive after HD but has resolved. -Volume control with HD -Midodrine 15mg  TID  ESRD now on HD MWF: Currently using tunneled HD cath. Uneventful HD session yesterday except for back pain which is a usual complaint for pt. Right AVG placed 5/1 without apparent complication. Graft can be used in 4 weeks.  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   Chronic Atrial Fibrillation: Failed DCCV 3/28 & 4/5. Briefly cardioverted after DCCV 4/12. Continues in AFib. -Amio 400mg  BID -Start ASA 81mg  daily -OK for d/c from cards perspective  Acute on Chronic Anemia: Hb has been stable  past several days. -CBC in AM  Persistent shock s/p VRE bacteremia: Resolved. Repeat blood cultures on 4/19 show NGTD. Was a little hypotensive after HD yesterday, resolved. Off stress dose steroids -on midodrine 15mg  TID -completed ampicillin 4/18  Adrenal Insufficiency: May need to briefly augment steroids if pt were to be persistently hypotensive  Dispo: LTACH. Ready for d/c upon bed availability.   Paige Marshall, D.O.  PGY-I Internal Medicine Resident Pager# [phone] 02/10/2017, 7:20 AM

## 2017-02-10 NOTE — Progress Notes (Signed)
  Echocardiogram 2D Echocardiogram Limited has been performed.  Darlina Sicilian M 02/10/2017, 11:23 AM

## 2017-02-17 ENCOUNTER — Inpatient Hospital Stay (HOSPITAL_COMMUNITY): Payer: Medicare Other

## 2017-02-23 ENCOUNTER — Inpatient Hospital Stay (HOSPITAL_COMMUNITY): Admission: RE | Admit: 2017-02-23 | Payer: Medicare Other | Source: Ambulatory Visit

## 2017-03-01 ENCOUNTER — Inpatient Hospital Stay (HOSPITAL_COMMUNITY): Payer: Medicare Other

## 2017-03-01 ENCOUNTER — Inpatient Hospital Stay (HOSPITAL_COMMUNITY)
Admission: AD | Admit: 2017-03-01 | Discharge: 2017-03-11 | DRG: 871 | Disposition: E | Payer: Medicare Other | Source: Other Acute Inpatient Hospital | Attending: Internal Medicine | Admitting: Internal Medicine

## 2017-03-01 ENCOUNTER — Other Ambulatory Visit (HOSPITAL_COMMUNITY): Payer: Self-pay | Admitting: Radiology

## 2017-03-01 DIAGNOSIS — Y95 Nosocomial condition: Secondary | ICD-10-CM | POA: Diagnosis present

## 2017-03-01 DIAGNOSIS — I96 Gangrene, not elsewhere classified: Secondary | ICD-10-CM | POA: Diagnosis present

## 2017-03-01 DIAGNOSIS — Z7982 Long term (current) use of aspirin: Secondary | ICD-10-CM

## 2017-03-01 DIAGNOSIS — J9621 Acute and chronic respiratory failure with hypoxia: Secondary | ICD-10-CM | POA: Diagnosis present

## 2017-03-01 DIAGNOSIS — Z8673 Personal history of transient ischemic attack (TIA), and cerebral infarction without residual deficits: Secondary | ICD-10-CM

## 2017-03-01 DIAGNOSIS — J189 Pneumonia, unspecified organism: Secondary | ICD-10-CM | POA: Diagnosis present

## 2017-03-01 DIAGNOSIS — G9341 Metabolic encephalopathy: Secondary | ICD-10-CM | POA: Diagnosis present

## 2017-03-01 DIAGNOSIS — A419 Sepsis, unspecified organism: Principal | ICD-10-CM | POA: Diagnosis present

## 2017-03-01 DIAGNOSIS — R579 Shock, unspecified: Secondary | ICD-10-CM | POA: Diagnosis present

## 2017-03-01 DIAGNOSIS — I248 Other forms of acute ischemic heart disease: Secondary | ICD-10-CM | POA: Diagnosis present

## 2017-03-01 DIAGNOSIS — L8990 Pressure ulcer of unspecified site, unspecified stage: Secondary | ICD-10-CM | POA: Insufficient documentation

## 2017-03-01 DIAGNOSIS — Z992 Dependence on renal dialysis: Secondary | ICD-10-CM | POA: Diagnosis not present

## 2017-03-01 DIAGNOSIS — J9600 Acute respiratory failure, unspecified whether with hypoxia or hypercapnia: Secondary | ICD-10-CM

## 2017-03-01 DIAGNOSIS — Z87891 Personal history of nicotine dependence: Secondary | ICD-10-CM | POA: Diagnosis not present

## 2017-03-01 DIAGNOSIS — Z6841 Body Mass Index (BMI) 40.0 and over, adult: Secondary | ICD-10-CM | POA: Diagnosis not present

## 2017-03-01 DIAGNOSIS — I428 Other cardiomyopathies: Secondary | ICD-10-CM | POA: Diagnosis present

## 2017-03-01 DIAGNOSIS — D6959 Other secondary thrombocytopenia: Secondary | ICD-10-CM | POA: Diagnosis present

## 2017-03-01 DIAGNOSIS — E872 Acidosis: Secondary | ICD-10-CM | POA: Diagnosis present

## 2017-03-01 DIAGNOSIS — I132 Hypertensive heart and chronic kidney disease with heart failure and with stage 5 chronic kidney disease, or end stage renal disease: Secondary | ICD-10-CM | POA: Diagnosis present

## 2017-03-01 DIAGNOSIS — I959 Hypotension, unspecified: Secondary | ICD-10-CM | POA: Diagnosis not present

## 2017-03-01 DIAGNOSIS — D696 Thrombocytopenia, unspecified: Secondary | ICD-10-CM | POA: Diagnosis present

## 2017-03-01 DIAGNOSIS — E1152 Type 2 diabetes mellitus with diabetic peripheral angiopathy with gangrene: Secondary | ICD-10-CM | POA: Diagnosis present

## 2017-03-01 DIAGNOSIS — Z66 Do not resuscitate: Secondary | ICD-10-CM | POA: Diagnosis present

## 2017-03-01 DIAGNOSIS — N186 End stage renal disease: Secondary | ICD-10-CM | POA: Diagnosis present

## 2017-03-01 DIAGNOSIS — M109 Gout, unspecified: Secondary | ICD-10-CM | POA: Diagnosis present

## 2017-03-01 DIAGNOSIS — Z880 Allergy status to penicillin: Secondary | ICD-10-CM

## 2017-03-01 DIAGNOSIS — Z947 Corneal transplant status: Secondary | ICD-10-CM

## 2017-03-01 DIAGNOSIS — E2740 Unspecified adrenocortical insufficiency: Secondary | ICD-10-CM | POA: Diagnosis present

## 2017-03-01 DIAGNOSIS — E162 Hypoglycemia, unspecified: Secondary | ICD-10-CM | POA: Diagnosis present

## 2017-03-01 DIAGNOSIS — R4020 Unspecified coma: Secondary | ICD-10-CM | POA: Diagnosis not present

## 2017-03-01 DIAGNOSIS — E11649 Type 2 diabetes mellitus with hypoglycemia without coma: Secondary | ICD-10-CM | POA: Diagnosis present

## 2017-03-01 DIAGNOSIS — Z79899 Other long term (current) drug therapy: Secondary | ICD-10-CM

## 2017-03-01 DIAGNOSIS — Z789 Other specified health status: Secondary | ICD-10-CM

## 2017-03-01 DIAGNOSIS — I482 Chronic atrial fibrillation: Secondary | ICD-10-CM | POA: Diagnosis present

## 2017-03-01 DIAGNOSIS — J9690 Respiratory failure, unspecified, unspecified whether with hypoxia or hypercapnia: Secondary | ICD-10-CM

## 2017-03-01 DIAGNOSIS — E1122 Type 2 diabetes mellitus with diabetic chronic kidney disease: Secondary | ICD-10-CM | POA: Diagnosis present

## 2017-03-01 DIAGNOSIS — D631 Anemia in chronic kidney disease: Secondary | ICD-10-CM | POA: Diagnosis not present

## 2017-03-01 DIAGNOSIS — J9622 Acute and chronic respiratory failure with hypercapnia: Secondary | ICD-10-CM | POA: Diagnosis present

## 2017-03-01 DIAGNOSIS — J440 Chronic obstructive pulmonary disease with acute lower respiratory infection: Secondary | ICD-10-CM | POA: Diagnosis present

## 2017-03-01 DIAGNOSIS — R6521 Severe sepsis with septic shock: Secondary | ICD-10-CM | POA: Diagnosis present

## 2017-03-01 DIAGNOSIS — D6489 Other specified anemias: Secondary | ICD-10-CM | POA: Diagnosis present

## 2017-03-01 DIAGNOSIS — D638 Anemia in other chronic diseases classified elsewhere: Secondary | ICD-10-CM | POA: Diagnosis present

## 2017-03-01 DIAGNOSIS — E440 Moderate protein-calorie malnutrition: Secondary | ICD-10-CM | POA: Diagnosis present

## 2017-03-01 DIAGNOSIS — Z794 Long term (current) use of insulin: Secondary | ICD-10-CM

## 2017-03-01 DIAGNOSIS — I481 Persistent atrial fibrillation: Secondary | ICD-10-CM | POA: Diagnosis present

## 2017-03-01 DIAGNOSIS — G4733 Obstructive sleep apnea (adult) (pediatric): Secondary | ICD-10-CM | POA: Diagnosis present

## 2017-03-01 DIAGNOSIS — A0472 Enterocolitis due to Clostridium difficile, not specified as recurrent: Secondary | ICD-10-CM | POA: Diagnosis present

## 2017-03-01 DIAGNOSIS — Z91018 Allergy to other foods: Secondary | ICD-10-CM

## 2017-03-01 DIAGNOSIS — I5043 Acute on chronic combined systolic (congestive) and diastolic (congestive) heart failure: Secondary | ICD-10-CM | POA: Diagnosis present

## 2017-03-01 DIAGNOSIS — I272 Pulmonary hypertension, unspecified: Secondary | ICD-10-CM | POA: Diagnosis present

## 2017-03-01 DIAGNOSIS — M069 Rheumatoid arthritis, unspecified: Secondary | ICD-10-CM | POA: Diagnosis present

## 2017-03-01 DIAGNOSIS — N2581 Secondary hyperparathyroidism of renal origin: Secondary | ICD-10-CM | POA: Diagnosis not present

## 2017-03-01 DIAGNOSIS — Z4659 Encounter for fitting and adjustment of other gastrointestinal appliance and device: Secondary | ICD-10-CM

## 2017-03-01 LAB — GLUCOSE, CAPILLARY
GLUCOSE-CAPILLARY: 102 mg/dL — AB (ref 65–99)
GLUCOSE-CAPILLARY: 45 mg/dL — AB (ref 65–99)
GLUCOSE-CAPILLARY: 50 mg/dL — AB (ref 65–99)
GLUCOSE-CAPILLARY: 98 mg/dL (ref 65–99)
Glucose-Capillary: 142 mg/dL — ABNORMAL HIGH (ref 65–99)
Glucose-Capillary: 104 mg/dL — ABNORMAL HIGH (ref 65–99)
Glucose-Capillary: 111 mg/dL — ABNORMAL HIGH (ref 65–99)

## 2017-03-01 LAB — PHOSPHORUS: Phosphorus: 1.6 mg/dL — ABNORMAL LOW (ref 2.5–4.6)

## 2017-03-01 LAB — POCT I-STAT 3, ART BLOOD GAS (G3+)
ACID-BASE DEFICIT: 12 mmol/L — AB (ref 0.0–2.0)
Acid-base deficit: 13 mmol/L — ABNORMAL HIGH (ref 0.0–2.0)
Bicarbonate: 16 mmol/L — ABNORMAL LOW (ref 20.0–28.0)
Bicarbonate: 14.9 mmol/L — ABNORMAL LOW (ref 20.0–28.0)
O2 SAT: 99 %
O2 SAT: 96 %
PH ART: 7.154 — AB (ref 7.350–7.450)
PH ART: 7.156 — AB (ref 7.350–7.450)
Patient temperature: 98.6
Patient temperature: 98.6
TCO2: 17 mmol/L (ref 0–100)
TCO2: 16 mmol/L (ref 0–100)
pCO2 arterial: 45.6 mmHg (ref 32.0–48.0)
pCO2 arterial: 42.3 mmHg (ref 32.0–48.0)
pO2, Arterial: 180 mmHg — ABNORMAL HIGH (ref 83.0–108.0)
pO2, Arterial: 108 mmHg (ref 83.0–108.0)

## 2017-03-01 LAB — CBC
HCT: 30.3 % — ABNORMAL LOW (ref 36.0–46.0)
Hemoglobin: 8.8 g/dL — ABNORMAL LOW (ref 12.0–15.0)
MCH: 27.5 pg (ref 26.0–34.0)
MCHC: 29 g/dL — ABNORMAL LOW (ref 30.0–36.0)
MCV: 94.7 fL (ref 78.0–100.0)
Platelets: 84 10*3/uL — ABNORMAL LOW (ref 150–400)
RBC: 3.2 MIL/uL — AB (ref 3.87–5.11)
RDW: 28.8 % — ABNORMAL HIGH (ref 11.5–15.5)
WBC: 23.3 10*3/uL — ABNORMAL HIGH (ref 4.0–10.5)

## 2017-03-01 LAB — BASIC METABOLIC PANEL
ANION GAP: 7 (ref 5–15)
BUN: 40 mg/dL — ABNORMAL HIGH (ref 6–20)
CHLORIDE: 108 mmol/L (ref 101–111)
CO2: 13 mmol/L — AB (ref 22–32)
Calcium: 5.3 mg/dL — CL (ref 8.9–10.3)
Creatinine, Ser: 2.91 mg/dL — ABNORMAL HIGH (ref 0.44–1.00)
GFR calc Af Amer: 19 mL/min — ABNORMAL LOW (ref >60)
GFR, EST NON AFRICAN AMERICAN: 17 mL/min — AB (ref >60)
Glucose, Bld: 87 mg/dL (ref 65–99)
POTASSIUM: 2.4 mmol/L — AB (ref 3.5–5.1)
Sodium: 128 mmol/L — ABNORMAL LOW (ref 135–145)

## 2017-03-01 LAB — MAGNESIUM: Magnesium: 0.8 mg/dL — CL (ref 1.7–2.4)

## 2017-03-01 LAB — RENAL FUNCTION PANEL
ALBUMIN: 1.2 g/dL — AB (ref 3.5–5.0)
Anion gap: 13 (ref 5–15)
BUN: 44 mg/dL — AB (ref 6–20)
CHLORIDE: 93 mmol/L — AB (ref 101–111)
CO2: 17 mmol/L — ABNORMAL LOW (ref 22–32)
CREATININE: 3.43 mg/dL — AB (ref 0.44–1.00)
Calcium: 7.3 mg/dL — ABNORMAL LOW (ref 8.9–10.3)
GFR calc Af Amer: 16 mL/min — ABNORMAL LOW (ref >60)
GFR, EST NON AFRICAN AMERICAN: 14 mL/min — AB (ref >60)
Glucose, Bld: 105 mg/dL — ABNORMAL HIGH (ref 65–99)
PHOSPHORUS: 2.7 mg/dL (ref 2.5–4.6)
Potassium: 3.9 mmol/L (ref 3.5–5.1)
Sodium: 123 mmol/L — ABNORMAL LOW (ref 135–145)

## 2017-03-01 LAB — LIPASE, BLOOD: LIPASE: 15 U/L (ref 11–51)

## 2017-03-01 LAB — COOXEMETRY PANEL
Carboxyhemoglobin: 2.7 % — ABNORMAL HIGH (ref 0.5–1.5)
Methemoglobin: 1.1 % (ref 0.0–1.5)
O2 Saturation: 85.3 %
TOTAL HEMOGLOBIN: 8.5 g/dL — AB (ref 12.0–16.0)

## 2017-03-01 LAB — PROTIME-INR
INR: 2.48
Prothrombin Time: 27.3 seconds — ABNORMAL HIGH (ref 11.4–15.2)

## 2017-03-01 LAB — AMYLASE: AMYLASE: 68 U/L (ref 28–100)

## 2017-03-01 LAB — TROPONIN I: Troponin I: 0.19 ng/mL (ref <0.03)

## 2017-03-01 LAB — LACTIC ACID, PLASMA: Lactic Acid, Venous: 4.7 mmol/L (ref 0.5–1.9)

## 2017-03-01 LAB — PROCALCITONIN: Procalcitonin: 7.12 ng/mL

## 2017-03-01 LAB — CORTISOL: Cortisol, Plasma: 34.2 ug/dL

## 2017-03-01 LAB — D-DIMER, QUANTITATIVE: D-Dimer, Quant: 2.17 ug/mL-FEU — ABNORMAL HIGH (ref 0.00–0.50)

## 2017-03-01 LAB — APTT: aPTT: 99 seconds — ABNORMAL HIGH (ref 24–36)

## 2017-03-01 LAB — MRSA PCR SCREENING: MRSA BY PCR: NEGATIVE

## 2017-03-01 MED ORDER — VANCOMYCIN 50 MG/ML ORAL SOLUTION
500.0000 mg | Freq: Four times a day (QID) | ORAL | Status: DC
  Administered 2017-03-01 – 2017-03-03 (×7): 500 mg
  Filled 2017-03-01 (×11): qty 10

## 2017-03-01 MED ORDER — SODIUM CHLORIDE 0.9 % IR SOLN
500.0000 mg | Freq: Four times a day (QID) | Status: DC
  Administered 2017-03-01 – 2017-03-04 (×10): 500 mg via RECTAL
  Filled 2017-03-01 (×13): qty 500

## 2017-03-01 MED ORDER — STERILE WATER FOR INJECTION IV SOLN
INTRAVENOUS | Status: DC
  Administered 2017-03-01 – 2017-03-02 (×2): via INTRAVENOUS_CENTRAL
  Filled 2017-03-01 (×7): qty 150

## 2017-03-01 MED ORDER — STERILE WATER FOR INJECTION IV SOLN
INTRAVENOUS | Status: DC
  Administered 2017-03-01 – 2017-03-02 (×2): via INTRAVENOUS_CENTRAL
  Filled 2017-03-01 (×3): qty 150

## 2017-03-01 MED ORDER — FENTANYL 2500MCG IN NS 250ML (10MCG/ML) PREMIX INFUSION
25.0000 ug/h | INTRAVENOUS | Status: DC
  Administered 2017-03-01: 25 ug/h via INTRAVENOUS
  Filled 2017-03-01: qty 250

## 2017-03-01 MED ORDER — DEXTROSE 50 % IV SOLN
INTRAVENOUS | Status: AC
  Administered 2017-03-01: 25 mL
  Filled 2017-03-01: qty 50

## 2017-03-01 MED ORDER — FENTANYL BOLUS VIA INFUSION
50.0000 ug | INTRAVENOUS | Status: DC | PRN
  Filled 2017-03-01: qty 50

## 2017-03-01 MED ORDER — FAMOTIDINE IN NACL 20-0.9 MG/50ML-% IV SOLN
20.0000 mg | INTRAVENOUS | Status: DC
  Filled 2017-03-01: qty 50

## 2017-03-01 MED ORDER — SODIUM CHLORIDE 0.9 % IV SOLN: 250.0000 mL | INTRAVENOUS | Status: DC | PRN

## 2017-03-01 MED ORDER — DEXTROSE 10 % IV SOLN
INTRAVENOUS | Status: AC
  Administered 2017-03-01: 13:00:00 via INTRAVENOUS

## 2017-03-01 MED ORDER — SODIUM CHLORIDE 0.9 % FOR CRRT
INTRAVENOUS_CENTRAL | Status: DC | PRN
  Filled 2017-03-01: qty 1000

## 2017-03-01 MED ORDER — FENTANYL CITRATE (PF) 100 MCG/2ML IJ SOLN: 50.0000 ug | Freq: Once | INTRAMUSCULAR | Status: DC

## 2017-03-01 MED ORDER — DEXTROSE 50 % IV SOLN
INTRAVENOUS | Status: AC
  Administered 2017-03-01: 50 mL
  Filled 2017-03-01: qty 50

## 2017-03-01 MED ORDER — SODIUM CHLORIDE 0.9 % IV SOLN
500.0000 mg | INTRAVENOUS | Status: DC
  Administered 2017-03-01: 500 mg via INTRAVENOUS
  Filled 2017-03-01: qty 0.5

## 2017-03-01 MED ORDER — LINEZOLID 600 MG/300ML IV SOLN
600.0000 mg | Freq: Two times a day (BID) | INTRAVENOUS | Status: DC
  Administered 2017-03-01 – 2017-03-04 (×6): 600 mg via INTRAVENOUS
  Filled 2017-03-01 (×7): qty 300

## 2017-03-01 MED ORDER — SODIUM BICARBONATE 8.4 % IV SOLN
INTRAVENOUS | Status: DC
  Administered 2017-03-01 – 2017-03-04 (×6): via INTRAVENOUS
  Filled 2017-03-01 (×9): qty 150

## 2017-03-01 MED ORDER — SODIUM CHLORIDE 0.9 % IV SOLN
0.0000 ug/min | INTRAVENOUS | Status: DC
  Administered 2017-03-01: 200 ug/min via INTRAVENOUS
  Filled 2017-03-01: qty 4

## 2017-03-01 MED ORDER — METRONIDAZOLE IN NACL 5-0.79 MG/ML-% IV SOLN
500.0000 mg | Freq: Three times a day (TID) | INTRAVENOUS | Status: DC
  Administered 2017-03-01 – 2017-03-03 (×5): 500 mg via INTRAVENOUS
  Filled 2017-03-01 (×8): qty 100

## 2017-03-01 MED ORDER — HYDROCORTISONE NA SUCCINATE PF 100 MG IJ SOLR
50.0000 mg | Freq: Four times a day (QID) | INTRAMUSCULAR | Status: DC
  Administered 2017-03-01 – 2017-03-04 (×12): 50 mg via INTRAVENOUS
  Filled 2017-03-01 (×3): qty 1
  Filled 2017-03-01: qty 2
  Filled 2017-03-01 (×3): qty 1
  Filled 2017-03-01 (×2): qty 2
  Filled 2017-03-01 (×5): qty 1
  Filled 2017-03-01: qty 2
  Filled 2017-03-01: qty 1

## 2017-03-01 MED ORDER — MAGNESIUM SULFATE 4 GM/100ML IV SOLN
4.0000 g | Freq: Once | INTRAVENOUS | Status: AC
  Administered 2017-03-01: 4 g via INTRAVENOUS
  Filled 2017-03-01 (×2): qty 100

## 2017-03-01 MED ORDER — NOREPINEPHRINE BITARTRATE 1 MG/ML IV SOLN
0.0000 ug/min | INTRAVENOUS | Status: DC
  Administered 2017-03-01: 50 ug/min via INTRAVENOUS
  Administered 2017-03-01 – 2017-03-02 (×2): 40 ug/min via INTRAVENOUS
  Administered 2017-03-02 (×3): 55 ug/min via INTRAVENOUS
  Administered 2017-03-02: 50 ug/min via INTRAVENOUS
  Administered 2017-03-03 (×2): 60 ug/min via INTRAVENOUS
  Administered 2017-03-03 (×2): 50 ug/min via INTRAVENOUS
  Administered 2017-03-03 – 2017-03-04 (×4): 80 ug/min via INTRAVENOUS
  Filled 2017-03-01 (×20): qty 16

## 2017-03-01 MED ORDER — ORAL CARE MOUTH RINSE
15.0000 mL | OROMUCOSAL | Status: DC
  Administered 2017-03-01 – 2017-03-04 (×26): 15 mL via OROMUCOSAL

## 2017-03-01 MED ORDER — STERILE WATER FOR INJECTION IV SOLN
INTRAVENOUS | Status: DC
  Filled 2017-03-01: qty 850

## 2017-03-01 MED ORDER — FAMOTIDINE IN NACL 20-0.9 MG/50ML-% IV SOLN
20.0000 mg | Freq: Two times a day (BID) | INTRAVENOUS | Status: DC
  Filled 2017-03-01: qty 50

## 2017-03-01 MED ORDER — VASOPRESSIN 20 UNIT/ML IV SOLN
0.0300 [IU]/min | INTRAVENOUS | Status: DC
  Administered 2017-03-01 – 2017-03-03 (×3): 0.03 [IU]/min via INTRAVENOUS
  Filled 2017-03-01 (×4): qty 2

## 2017-03-01 MED ORDER — SODIUM CHLORIDE 0.9 % IV SOLN
0.0000 ug/min | INTRAVENOUS | Status: DC
  Filled 2017-03-01: qty 1

## 2017-03-01 MED ORDER — PHENYLEPHRINE HCL 10 MG/ML IJ SOLN
0.0000 ug/min | INTRAMUSCULAR | Status: DC
  Administered 2017-03-01: 200 ug/min via INTRAVENOUS
  Administered 2017-03-02 – 2017-03-04 (×17): 400 ug/min via INTRAVENOUS
  Filled 2017-03-01 (×18): qty 8

## 2017-03-01 MED ORDER — STERILE WATER FOR INJECTION IV SOLN
INTRAVENOUS | Status: DC
  Administered 2017-03-02 – 2017-03-04 (×5): via INTRAVENOUS_CENTRAL
  Filled 2017-03-01 (×8): qty 300

## 2017-03-01 MED ORDER — DEXTROSE 5 % IV SOLN
20.0000 meq | Freq: Once | INTRAVENOUS | Status: AC
  Administered 2017-03-01: 20 meq via INTRAVENOUS
  Filled 2017-03-01: qty 4.55

## 2017-03-01 MED ORDER — SODIUM CHLORIDE 0.9 % IV SOLN
1.0000 g | Freq: Two times a day (BID) | INTRAVENOUS | Status: DC
  Administered 2017-03-02 – 2017-03-04 (×5): 1 g via INTRAVENOUS
  Filled 2017-03-01 (×6): qty 1

## 2017-03-01 MED ORDER — PRISMASOL BGK 4/2.5 32-4-2.5 MEQ/L IV SOLN
INTRAVENOUS | Status: DC
  Administered 2017-03-01 – 2017-03-04 (×26): via INTRAVENOUS_CENTRAL
  Filled 2017-03-01 (×35): qty 5000

## 2017-03-01 MED ORDER — CHLORHEXIDINE GLUCONATE 0.12% ORAL RINSE (MEDLINE KIT)
15.0000 mL | Freq: Two times a day (BID) | OROMUCOSAL | Status: DC
  Administered 2017-03-01 – 2017-03-04 (×6): 15 mL via OROMUCOSAL

## 2017-03-01 MED ORDER — VANCOMYCIN 50 MG/ML ORAL SOLUTION
125.0000 mg | Freq: Four times a day (QID) | ORAL | Status: DC
  Filled 2017-03-01 (×2): qty 2.5

## 2017-03-01 MED ORDER — STERILE WATER FOR INJECTION IV SOLN
INTRAVENOUS | Status: DC
  Administered 2017-03-02 – 2017-03-03 (×3): via INTRAVENOUS_CENTRAL
  Filled 2017-03-01 (×6): qty 300

## 2017-03-01 MED ORDER — PHENYLEPHRINE HCL 10 MG/ML IJ SOLN
0.0000 ug/min | INTRAMUSCULAR | Status: DC
  Administered 2017-03-01 (×2): 200 ug/min via INTRAVENOUS
  Filled 2017-03-01 (×5): qty 4

## 2017-03-01 MED ORDER — POTASSIUM CHLORIDE 20 MEQ/15ML (10%) PO SOLN
40.0000 meq | Freq: Once | ORAL | Status: AC
Start: 2017-03-01 — End: 2017-03-01
  Administered 2017-03-01: 40 meq
  Filled 2017-03-01: qty 30

## 2017-03-01 MED ORDER — FAMOTIDINE IN NACL 20-0.9 MG/50ML-% IV SOLN
20.0000 mg | INTRAVENOUS | Status: DC
  Administered 2017-03-01 – 2017-03-03 (×3): 20 mg via INTRAVENOUS
  Filled 2017-03-01 (×4): qty 50

## 2017-03-01 MED ORDER — SODIUM CHLORIDE 0.9 % IV SOLN: INTRAVENOUS | Status: DC

## 2017-03-01 MED ORDER — HEPARIN SODIUM (PORCINE) 1000 UNIT/ML DIALYSIS
1000.0000 [IU] | INTRAMUSCULAR | Status: DC | PRN
  Filled 2017-03-01: qty 6

## 2017-03-01 NOTE — Consult Note (Signed)
Referring Provider: No ref. provider found Primary Care Physician:  Kerin Perna, NP Primary Nephrologist:   Reason for Consultation:  Management of ESRD in Shock  HPI: 61 year old female, [facility] resident, who presented to [facility] on 5/22 with concern for septic shock in the setting of CDiff  At baseline, the patient has a complex medical history to included chronic combined CHF, NICM, permanent AF, CKD / ESRD on HD M/W/F, former smoker, COPD, PH, DJD, HTN, morbid obesity, OSA, CVA and rheumatoid arthritis.  She was recently admitted from 12/26/16 - 02/10/17 for acute on chronic hypoxic/hypercapnic respiratory failure, AFwRVR, subdural hematoma / ICH, VRE bacteremia, adrenal insufficiency.  She was discharged to [facility] on 02/10/17 for further rehab efforts.    Past Medical History:  Diagnosis Date  . Asthma   . Chronic combined systolic and diastolic CHF (congestive heart failure) ([place])   . CKD (chronic kidney disease), stage III   . COPD (chronic obstructive pulmonary disease) ([employer])   . Degenerative joint disease of both lower legs   . Former smoker   . Gout    hx in "both knees" (01/14/2015)  . H. pylori infection 01/03/2014   +breath test.    . High cholesterol   . Hypertension   . Morbid obesity ([place])   . NICM (nonischemic cardiomyopathy) ([place])    a. LHC 01/2015: normal cors, EF 45-50%. b. EF 50-55% in 08/2016. c. EF 33% 01/2017  . OSA (obstructive sleep apnea)   . Persistent atrial fibrillation ([place])   . Pneumonia X 2  . Pulmonary hypertension ([place])   . Rheumatoid arthritis ([place])   . Stroke [facility])     Past Surgical History:  Procedure Laterality Date  . ANGIOGRAM/LV (CONGENITAL)  2007  . AV FISTULA PLACEMENT Right 02/08/2017   Procedure: ARTERIOVENOUS (AV) GORTEX GRAFT INSERTION RIGHT LOWER ARM;  Surgeon: Angelia Mould, MD;  Location: [place];  Service: Vascular;  Laterality: Right;  . BREATH TEK H PYLORI N/A 12/31/2013   Procedure: BREATH TEK H PYLORI;  Surgeon:  Gayland Curry, MD;  Location: [facility];  Service: General;  Laterality: N/A;  . CARDIOVERSION N/A 01/13/2017   Procedure: CARDIOVERSION;  Surgeon: Jolaine Artist, MD;  Location: [facility] ENDOSCOPY;  Service: Cardiovascular;  Laterality: N/A;  . CARDIOVERSION N/A 01/20/2017   Procedure: CARDIOVERSION;  Surgeon: Larey Dresser, MD;  Location: [place];  Service: Cardiovascular;  Laterality: N/A;  . CORNEAL TRANSPLANT Right ~ 2010  . CORONARY ANGIOPLASTY WITH STENT PLACEMENT  11/04/2005   "1"  . DOPPLER ECHOCARDIOGRAPHY     2 D   EF of 45%  . EYE SURGERY    . IR GENERIC HISTORICAL  01/06/2017   IR FLUORO GUIDE CV LINE RIGHT 01/06/2017 Corrie Mckusick, DO [facility]INTER[facility]  . IR GENERIC HISTORICAL  01/06/2017   IR US GUIDE VASC ACCESS RIGHT 01/06/2017 Corrie Mckusick, DO [facility]  . LEFT HEART CATHETERIZATION WITH CORONARY ANGIOGRAM N/A 01/20/2015   Procedure: LEFT HEART CATHETERIZATION WITH CORONARY ANGIOGRAM;  Surgeon: Lorretta Harp, MD;  Location: [facility] CATH LAB;  Service: Cardiovascular;  Laterality: N/A;  . RIGHT HEART CATH N/A 01/07/2017   Procedure: Right Heart Cath;  Surgeon: Jolaine Artist, MD;  Location: [place] CV LAB;  Service: Cardiovascular;  Laterality: N/A;  . sleep study  2011  . stress myocardial dipyridamole perfusion    . TEE WITHOUT CARDIOVERSION N/A 01/13/2017   Procedure: TRANSESOPHAGEAL ECHOCARDIOGRAM (TEE);  Surgeon: Jolaine Artist, MD;  Location:  [facility];  Service: Cardiovascular;  Laterality: N/A;  . TUBAL LIGATION  1982  . venous duplex ultrasound  2013    Prior to Admission medications   Medication Sig Start Date End Date Taking? Authorizing Provider  acyclovir (ZOVIRAX) 200 MG capsule Take 200 mg by mouth every 12 (twelve) hours.   Yes [provider]  amiodarone (PACERONE) 400 MG tablet Take 1 tablet (400 mg total) by mouth 2 (two) times daily. Patient taking differently: Take 200 mg by mouth 2 (two) times daily.  02/10/17  Yes Molt, Bethany, DO   bisacodyl (DULCOLAX) 5 MG EC tablet Take 1 tablet (5 mg total) by mouth daily as needed for moderate constipation. 11/13/16  Yes Theodis Blaze, MD  Darbepoetin Alfa (ARANESP) 150 MCG/0.3ML SOSY injection Inject 0.3 mLs (150 mcg total) into the skin every Monday at 6 PM. Patient taking differently: Inject 100 mcg into the skin every Monday at 6 PM.  02/14/17  Yes Molt, Bethany, DO  diclofenac sodium (VOLTAREN) 1 % GEL Apply 4 g topically every 6 (six) hours as needed (pain).    Yes [provider]  glucagon (GLUCAGON EMERGENCY) 1 MG injection Inject 1 mg into the vein once as needed.   Yes [provider]  insulin aspart (NOVOLOG) 100 UNIT/ML injection Inject 0-9 Units into the skin 3 (three) times daily with meals. Patient taking differently: Inject 0-5 Units into the skin 4 (four) times daily -  before meals and at bedtime. Per sliding scale. If BS 71-150 = 0 units, 151-200 = 1 unit, 201-250 = 2 units, 251-300 = 3 units, 301-350 = 4 units, 351-400 = 5 units 02/10/17  Yes Molt, Bethany, DO  ivabradine (CORLANOR) 5 MG TABS tablet Take 1 tablet (5 mg total) by mouth 2 (two) times daily with a meal. 02/10/17  Yes Molt, Bethany, DO  lanthanum (FOSRENOL) 1000 MG chewable tablet Chew 1 tablet (1,000 mg total) by mouth 3 (three) times daily with meals. Patient taking differently: Chew 500 mg by mouth 3 (three) times daily before meals.  02/10/17  Yes Molt, Bethany, DO  MENTHOL-ZINC OXIDE EX Apply 1 application topically every 6 (six) hours.   Yes [provider]  metroNIDAZOLE (FLAGYL) 500 MG tablet Take 500 mg by mouth every 8 (eight) hours. 02/23/17  Yes [provider]  midodrine (PROAMATINE) 5 MG tablet Take 3 tablets (15 mg total) by mouth 3 (three) times daily with meals. Patient taking differently: Take 10-15 mg by mouth See admin instructions. Take 15 mg by mouth 3 times daily with meals. Take 10 mg by mouth daily as needed 02/10/17  Yes Molt, Bethany, DO  Multiple Vitamin  (MULTIVITAMIN WITH MINERALS) TABS tablet Take 1 tablet by mouth daily.   Yes [provider]  OMEGA-3 FATTY ACIDS PO Take 1,000 mg by mouth every 12 (twelve) hours.   Yes [provider]  oxyCODONE-acetaminophen (PERCOCET/ROXICET) 5-325 MG tablet Take 1-2 tablets by mouth every 4 (four) hours as needed for severe pain. Patient taking differently: Take 1 tablet by mouth every 4 (four) hours as needed for severe pain.  02/10/17  Yes Molt, Bethany, DO  polyethylene glycol (MIRALAX / GLYCOLAX) packet Take 17 g by mouth daily as needed. Patient taking differently: Take 17 g by mouth daily as needed (constipation). Mix in 8 oz liquid and drink 09/23/16  Yes Ghimire, Henreitta Leber, MD  POLYVINYL ALCOHOL OP Apply 1 drop to eye every 6 (six) hours as needed.   Yes [provider]  PROAIR HFA 108 (90 Base) MCG/ACT inhaler INHALE 2 PUFFS EVERY 6 HOURS AS NEEDED FOR WHEEZING OR SHORTNESS OF BREATH. Patient taking differently: INHALE 2 PUFFS EVERY 4 HOURS AS NEEDED FOR WHEEZING OR SHORTNESS OF BREATH. 11/12/16  Yes Young, Clinton D, MD  zolpidem (AMBIEN) 5 MG tablet Take 5 mg by mouth at bedtime as needed for sleep.   Yes [provider]  ARTIFICIAL TEAR OP Apply 1 drop to eye every 6 (six) hours as needed (dry eyes).    [provider]  docusate sodium (COLACE) 100 MG capsule Take 1 capsule (100 mg total) by mouth 2 (two) times daily. 09/14/16   Eugenie Filler, MD  ferrous sulfate 325 (65 FE) MG tablet Take 1 tablet (325 mg total) by mouth 3 (three) times daily with meals. 09/23/16   Ghimire, Henreitta Leber, MD  Hydrocortisone (GERHARDT'S BUTT CREAM) CREA Apply 1 application topically daily. 02/10/17   Molt, Bethany, DO  Nutritional Supplements (FEEDING SUPPLEMENT, NEPRO CARB STEADY,) LIQD Take 237 mLs by mouth 2 (two) times daily between meals. 02/11/17   Molt, Bethany, DO  PRESCRIPTION MEDICATION Inhale into the lungs at bedtime. BIPAP    [provider]  promethazine  (PHENERGAN) 25 MG tablet Take 25 mg by mouth every 6 (six) hours as needed for nausea or vomiting.    [provider]  senna-docusate (SENNA S) 8.6-50 MG tablet Take 2 tablets by mouth See admin instructions. Take 2 tablet by mouth daily at bedtime, may also take 2 tablets as needed for constipation    [provider]    Current Facility-Administered Medications  Medication Dose Route Frequency Provider Last Rate Last Dose  . 0.9 %  sodium chloride infusion  250 mL Intravenous PRN Minor, Grace Bushy, NP      . dextrose 10 % infusion   Intravenous Continuous Ollis, Brandi L, NP 20 mL/hr at 02/10/2017 1300    . famotidine (PEPCID) IVPB 20 mg premix  20 mg Intravenous Q24H Raylene Miyamoto, MD      . fentaNYL (SUBLIMAZE) bolus via infusion 50 mcg  50 mcg Intravenous Q1H PRN Minor, Grace Bushy, NP      . fentaNYL (SUBLIMAZE) injection 50 mcg  50 mcg Intravenous Once Minor, Grace Bushy, NP      . fentaNYL 2557mcg in NS 240mL (64mcg/ml) infusion-PREMIX  25-400 mcg/hr Intravenous Continuous Minor, Grace Bushy, NP   Stopped at 03/09/2017 1300  . hydrocortisone sodium succinate (SOLU-CORTEF) 100 MG injection 50 mg  50 mg Intravenous Q6H Minor, Grace Bushy, NP   50 mg at 03/10/2017 1434  . linezolid (ZYVOX) IVPB 600 mg  600 mg Intravenous Q12H Minor, Grace Bushy, NP 300 mL/hr at 02/28/2017 1443 600 mg at 02/26/2017 1443  . magnesium sulfate IVPB 4 g 100 mL  4 g Intravenous Once Ollis, Brandi L, NP      . meropenem (MERREM) 500 mg in sodium chloride 0.9 % 50 mL IVPB  500 mg Intravenous Q24H Priscella Mann, RPH   Stopped at 02/18/2017 1418  . metroNIDAZOLE (FLAGYL) IVPB 500 mg  500 mg Intravenous Q8H Minor, Grace Bushy, NP      . norepinephrine (LEVOPHED) 16 mg in dextrose 5 % 250 mL (0.064 mg/mL) infusion  0-40 mcg/min Intravenous Titrated Minor, Grace Bushy, NP 46.9 mL/hr at 03/05/2017 1353 50 mcg/min at 02/16/2017 1353  . phenylephrine (NEO-SYNEPHRINE) 40 mg in sodium chloride 0.9 % 250 mL (0.16 mg/mL) infusion   0-400 mcg/min Intravenous Titrated Alfredo Martinez,  Brandi L, NP      . potassium phosphate 20 mEq in dextrose 5 % 250 mL infusion  20 mEq Intravenous Once Ollis, Brandi L, NP      . sodium bicarbonate 150 mEq in dextrose 5 % 1,000 mL infusion   Intravenous Continuous Ollis, Brandi L, NP 75 mL/hr at 03/06/2017 1345    . vancomycin (VANCOCIN) 50 mg/mL oral solution 500 mg  500 mg Per Tube Q6H Minor, Grace Bushy, NP      . vancomycin (VANCOCIN) 500 mg in sodium chloride irrigation 0.9 % 100 mL ENEMA  500 mg Rectal Q6H Ollis, Brandi L, NP        Allergies as of 02/24/2017 - Review Complete 02/27/2017  Allergen Reaction Noted  . Penicillins Hives, Itching, Swelling, and Other (See Comments) 12/08/2011  . Citrus Hives 04/04/2016    Family History  Problem Relation Age of Onset  . Heart disease Mother   . Cancer Father        colon  . Hypertension Other     Social History   Social History  . Marital status: Widowed    Spouse name: N/A  . Number of children: N/A  . Years of education: N/A   Occupational History  . Not on file.   Social History Main Topics  . Smoking status: Former Smoker    Packs/day: 0.33    Years: 35.00    Types: Cigarettes    Quit date: 10/11/2008  . Smokeless tobacco: Never Used  . Alcohol use No     Comment: 01/14/2015 "~ 4, 24oz cans beer/wk"  . Drug use: No  . Sexual activity: No   Other Topics Concern  . Not on file   Social History Narrative  . No narrative on file    Review of Systems:  Physical Exam: Vital signs in last 24 hours: Temp:  [99 F (37.2 C)] 99 F (37.2 C) (05/22 1224) Pulse Rate:  [98-128] 124 (05/22 1518) Resp:  [22-30] 30 (05/22 1518) BP: (33-109)/(21-45) 109/45 (05/22 1518) SpO2:  [100 %] 100 % (05/22 1518) Arterial Line BP: (90-118)/(20-40) 118/40 (05/22 1415) FiO2 (%):  [80 %-100 %] 80 % (05/22 1518) Weight:  [299 lb 9.7 oz (135.9 kg)] 299 lb 9.7 oz (135.9 kg) (05/22 1418)   General:   Obese ill appearing  Shock  Head:   Normocephalic and atraumatic. Eyes:  Sclera clear, no icterus.   Conjunctiva pink. Ears:  Normal auditory acuity. Nose:  No deformity, discharge,  or lesions. Mouth:  No deformity or lesions, dentition normal.  Intubated  Neck:  Supple; no masses or thyromegaly. JVP not elevated Lungs:  Coarse rhonchi Heart:  Tachycardia and irregular Abdomen:   Dstended and hypoactive Msk:  Symmetrical without gross deformities. Normal posture. Pulses:  No carotid, renal, femoral bruits. DP and PT symmetrical and equal Extremities:   Trace 1 + edema    Intake/Output from previous day: No intake/output data recorded. Intake/Output this shift: No intake/output data recorded.  Lab Results:  Recent Labs  02/19/2017 1329  WBC PENDING  HGB 8.8*  HCT 30.3*  PLT PENDING   BMET  Recent Labs  03/03/2017 1328  PHOS 1.6*   LFT No results for input(s): PROT, ALBUMIN, AST, ALT, ALKPHOS, BILITOT, BILIDIR, IBILI in the last 72 hours. PT/INR  Recent Labs  02/27/2017 1329  LABPROT 27.3*  INR 2.48   Hepatitis Panel No results for input(s): HEPBSAG, HCVAB, HEPAIGM, HEPBIGM in the last 72 hours.  Studies/Results: Dg Chest  Port 1 View  Result Date: 02/22/2017 CLINICAL DATA:  Knee lead minute patient.  Intubated. EXAM: PORTABLE CHEST 1 VIEW COMPARISON:  Single-view of the chest 01/27/2017. FINDINGS: Endotracheal tube is in place with the tip at the level of clavicular heads well above the carina. NG tube courses into the stomach and below the inferior margin of the film. Left IJ catheter tip projects in the mid to lower superior vena cava. Right IJ approach dialysis catheter tip at projects in the right atrium. There is marked cardiomegaly. Patchy bilateral airspace disease is seen the mid and lower lung zones and appears worse on the comparison exam. There is interstitial edema. No pneumothorax. IMPRESSION: Support tubes and lines projecting good position. Worsened patchy bilateral airspace disease appears  most intense in the bases is worrisome for pneumonia superimposed on edema. Electronically Signed   By: Inge Rise M.D.   On: 02/17/2017 13:18    Assessment/Plan:  End stage renal disease patient usually MWF transferred in septic shock to [facility] from [facility]  Labs are pending but with the hemodynamic instabilty and sepsis we shall plan on CRRT . Prognosis is poor. Possible toxic megacolon   Systolic and diastolic heart failure  Pressor dependent shock   Adrenal insufficiency   COPD  Gout   Persistent atrial fibrillation  Rheumatood   LOS: 0 Mikhi Athey W @TODAY @3 :22 PM

## 2017-03-01 NOTE — Progress Notes (Signed)
Received patient from [place], [place] shortly before 1800 to initiate CRRT. Focused assessment completed. CRRT initiated at 1858. Report given to Ria Comment, [place]. All questions answered.

## 2017-03-01 NOTE — H&P (Signed)
PULMONARY / CRITICAL CARE MEDICINE   Name: Paige Marshall MRN: [id number] DOB: [birth year]    ADMISSION DATE:  02/22/2017 CONSULTATION DATE:  02/18/2017  REFERRING MD:  [facility]  CHIEF COMPLAINT:  Cdiff  HISTORY OF PRESENT ILLNESS:   61 year old female, [facility] resident, who presented to [facility] on 5/22 with concern for septic shock in the setting of C-Diff.    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  She was recently admitted from 12/26/16 - 02/10/17 for acute on chronic hypoxic/hypercapnic respiratory failure, AFwRVR, subdural hematoma / ICH, VRE bacteremia, adrenal insufficiency.  She was discharged to [facility] on 02/10/17 for further rehab efforts.    On 5/22, she returned to [facility] with worsening acidosis, C-Diff infection with subsequent septic shock.  The patient had not been tolerating regular HD and there was also concern for possible need of HD.  Admit ABG 7.154 / 45.6 / 180 / 16.    PAST MEDICAL HISTORY :  She  has a past medical history of Asthma; Chronic combined systolic and diastolic CHF (congestive heart failure) ([place]); CKD (chronic kidney disease), stage III; COPD (chronic obstructive pulmonary disease) ([place]); Degenerative joint disease of both lower legs; Former smoker; Gout; H. pylori infection (01/03/2014); High cholesterol; Hypertension; Morbid obesity ([place]); NICM (nonischemic cardiomyopathy) ([place]); OSA (obstructive sleep apnea); Persistent atrial fibrillation ([place]); Pneumonia (X 2); Pulmonary hypertension ([place]); Rheumatoid arthritis ([place]); and Stroke ([place]).  PAST SURGICAL HISTORY: She  has a past surgical history that includes Breath tek h pylori (N/A, 12/31/2013); doppler echocardiography; Angiogram/lv (congenital) (2007); sleep study (2011); stress myocardial dipyridamole perfusion; venous  duplex ultrasound (2013); Coronary angioplasty with stent (11/04/2005); Tubal ligation (1982); Eye surgery; Corneal transplant (Right, ~ 2010); left heart catheterization with coronary angiogram (N/A, 01/20/2015); ir generic historical (01/06/2017); ir generic historical (01/06/2017); Right Heart Cath (N/A, 01/07/2017); TEE without cardioversion (N/A, 01/13/2017); Cardioversion (N/A, 01/13/2017); Cardioversion (N/A, 01/20/2017); and AV fistula placement (Right, 02/08/2017).  Allergies  Allergen Reactions  . Penicillins Hives, Itching, Swelling and Other (See Comments)    Tongue swelling Has patient had a PCN reaction causing immediate rash, facial/tongue/throat swelling, SOB or lightheadedness with hypotension: Yes  Clarified with the patient her penicillin allergy. Pt has tolerated cefepime and ampicillin.    . Citrus Hives    No current facility-administered medications on file prior to encounter.    Current Outpatient Prescriptions on File Prior to Encounter  Medication Sig  . amiodarone (PACERONE) 400 MG tablet Take 1 tablet (400 mg total) by mouth 2 (two) times daily.  . ARTIFICIAL TEAR OP Apply 1 drop to eye every 6 (six) hours as needed (dry eyes).  [name] aspirin EC 81 MG EC tablet Take 1 tablet (81 mg total) by mouth daily.  . bisacodyl (DULCOLAX) 5 MG EC tablet Take 1 tablet (5 mg total) by mouth daily as needed for moderate constipation. (Patient not taking: Reported on 12/26/2016)  . Darbepoetin Alfa (ARANESP) 150 MCG/0.3ML SOSY injection Inject 0.3 mLs (150 mcg total) into the skin every Monday at 6 PM.  . diclofenac sodium (VOLTAREN) 1 % GEL Apply 4 g topically every 6 (six) hours as needed (pain).   [name] docusate sodium (COLACE) 100 MG capsule Take 1 capsule (100 mg total) by mouth 2 (two) times daily.  . ferrous sulfate 325 (65 FE) MG tablet Take  1 tablet (325 mg total) by mouth 3 (three) times daily with meals.  . Hydrocortisone (GERHARDT'S BUTT CREAM) CREA Apply 1 application topically daily.   . insulin aspart (NOVOLOG) 100 UNIT/ML injection Inject 0-9 Units into the skin 3 (three) times daily with meals.  . ivabradine (CORLANOR) 5 MG TABS tablet Take 1 tablet (5 mg total) by mouth 2 (two) times daily with a meal.  . lanthanum (FOSRENOL) 1000 MG chewable tablet Chew 1 tablet (1,000 mg total) by mouth 3 (three) times daily with meals.  . midodrine (PROAMATINE) 5 MG tablet Take 3 tablets (15 mg total) by mouth 3 (three) times daily with meals.  . Multiple Vitamin (MULTIVITAMIN WITH MINERALS) TABS tablet Take 1 tablet by mouth daily.  . Nutritional Supplements (FEEDING SUPPLEMENT, NEPRO CARB STEADY,) LIQD Take 237 mLs by mouth 2 (two) times daily between meals.  [name] omega-3 acid ethyl esters (LOVAZA) 1 g capsule Take 1 g by mouth 2 (two) times daily.  [name] oxyCODONE-acetaminophen (PERCOCET/ROXICET) 5-325 MG tablet Take 1-2 tablets by mouth every 4 (four) hours as needed for severe pain.  . polyethylene glycol (MIRALAX / GLYCOLAX) packet Take 17 g by mouth daily as needed. (Patient taking differently: Take 17 g by mouth daily as needed (constipation). Mix in 8 oz liquid and drink)  . PRESCRIPTION MEDICATION Inhale into the lungs at bedtime. BIPAP  . PROAIR HFA 108 (90 Base) MCG/ACT inhaler INHALE 2 PUFFS EVERY 6 HOURS AS NEEDED FOR WHEEZING OR SHORTNESS OF BREATH.  . promethazine (PHENERGAN) 25 MG tablet Take 25 mg by mouth every 6 (six) hours as needed for nausea or vomiting.  . senna-docusate (SENNA S) 8.6-50 MG tablet Take 2 tablets by mouth See admin instructions. Take 2 tablet by mouth daily at bedtime, may also take 2 tablets as needed for constipation  . zolpidem (AMBIEN) 5 MG tablet Take 5 mg by mouth at bedtime as needed for sleep.    FAMILY HISTORY:  Her indicated that her mother is deceased. She indicated that her father is deceased. She indicated that her maternal grandmother is deceased. She indicated that her maternal grandfather is deceased. She indicated that her paternal  grandmother is deceased. She indicated that her paternal grandfather is deceased. She indicated that the status of her other is unknown.    SOCIAL HISTORY: She  reports that she quit smoking about 8 years ago. Her smoking use included Cigarettes. She has a 11.55 pack-year smoking history. She has never used smokeless tobacco. She reports that she does not drink alcohol or use drugs.  REVIEW OF SYSTEMS:   Unable to complete as patient is on mechanical ventilation.   SUBJECTIVE:  RN reports hypoglycemia in the 40's, given D50.  VITAL SIGNS: Temp 99 F (37.2 C) (Oral)   LMP 09/07/2011   HEMODYNAMICS:    VENTILATOR SETTINGS:    INTAKE / OUTPUT: No intake/output data recorded.  PHYSICAL EXAMINATION: General:  MOAAF sedated on vent Neuro:  Sedated and non responsive HEENT: OTT-> vent, NGT-> suction Cardiovascular:  HSR RRR Lungs:Coarse rhonchi bilaterally Abdomen: Obese soft no bs Musculoskeletal:  Rt av fistula with pulse. Rt finger necrotic tips Skin:  Cool, dusky  LABS:  BMET No results for input(s): NA, K, CL, CO2, BUN, CREATININE, GLUCOSE in the last 168 hours.  Electrolytes No results for input(s): CALCIUM, MG, PHOS in the last 168 hours.  CBC No results for input(s): WBC, HGB, HCT, PLT in the last 168 hours.  Coag's No results for input(s): APTT, INR  in the last 168 hours.  Sepsis Markers No results for input(s): LATICACIDVEN, PROCALCITON, O2SATVEN in the last 168 hours.  ABG No results for input(s): PHART, PCO2ART, PO2ART in the last 168 hours.  Liver Enzymes No results for input(s): AST, ALT, ALKPHOS, BILITOT, ALBUMIN in the last 168 hours.  Cardiac Enzymes No results for input(s): TROPONINI, PROBNP in the last 168 hours.  Glucose  Recent Labs Lab 02/15/2017 1221  GLUCAP 45*    Imaging No results found.   STUDIES:  CT ABD / Pelvis 5/22 >>  CT RUE 5/22 >>  CT Head w/o 5/22 >>   CULTURES: Tracheal Aspirate 5/22 >>  BCx2 5/22 >>  UC 5/22  >>  CDiff 5/22 >>  ANTIBIOTICS: Linezolid 5/22 >>  Meropenem 5/22 >> Flagyl 5/22 Vanco (oral) 5/22 >>  Vanco (rectal) 5/22 >>  SIGNIFICANT EVENTS: 5/22 Admit from [facility] with septic shock, C-Diff   LINES/TUBES: ETT 5/20 ([facility])>>  L IJ tunneled PICC >> R Fem TLC ([facility]) >> RUE AVF >>     DISCUSSION: 61 y/o F admitted 5/22 from [facility] with septic shock in the setting of C-Diff.    ASSESSMENT / PLAN:  PULMONARY A: Acute on Chronic Hypoxic / Hypercarbic Respiratory Failure - in setting of suspected septic shock, underlying OSA, COPD OSA  P:   PRVC 8 cc/kg  Increase vent rate to 30  Repeat ABG in 4 hours after vent adjustment  Intermittent CXR    CARDIOVASCULAR A:  Shock - presumed septic + component of adrenal insufficiency Troponin Leak - suspect demand ischemia   Combined CHF, NICM  P:  Levophed + Neo for MAP > 60  ICU monitoring of hemodynamics Stress steroids, solu-cortef 50 mg IV Q6   RENAL A:   ESRD rt arm fistula with ulcer Hypomagnesemia  Lactic Acidosis R Tunneled VAS Cath  P:   Trend BMP / urinary output Replace electrolytes as indicated Nephrology consulted, appreciate input KPhos   GASTROINTESTINAL A:   C-Diff - reportedly positive at [facility] On TNA at [facility]  Morbid Obesity  P:   See ID  NPO for now  Assess NGT placement  Consider TF  HEMATOLOGIC A:   Anemia Recent SDH P:  Serial H/H Check INR/PTT SCD's  No heparin   INFECTIOUS A:  Shock - suspected septic from C-Diff, +/- adrenal insufficiency  AVF Wound, R/O Infection  L Hand 1st, 2nd Digit Dry Gangrene  P:   ABX as above Pan culture as above Repeat C-Diff for confirmation given done at OSH CT RUE AVF, concern for infection  ENDOCRINE A:   DM ? Adrenal Insufficiency  Hypoglycemia, was on TNA at [place]:   Frequent CBG while hypoglycemic D10 infusion until Bicarb gtt initiated  SSI Check cortisol  Solu-cortef as above  NEUROLOGIC A:    Hx of SDH - recent admit from 3-02/2017 Intubated and sedated P:   RASS goal: 0 Repeat CT head now to rule out re-bleed (too hemodynamically unstable at present for transport) Minimize sedation to allow for neuro exam  FAMILY  - Updates: Sister Waynette Buttery, [phone]) updated via phone.  She states the patient told her she "wants everything done until the end".  Reviewed gravity of current critical illness and encouraged them to come visit / see the patient.  Also, expressed that she is so ill that she may not survive the next 24 hours.  Pt's daughter Erskine Squibb, [place] family meet or Palliative Care meeting due by:  day 7  CC Time: 87 minutes  Noe Gens, NP-C [facility] Pgr: [phone] or if no answer [phone] 02/11/2017, 1:31 PM   STAFF NOTE: I, Merrie Roof, MD FACP have personally reviewed patient's available data, including medical history, events of note, physical examination and test results as part of my evaluation. I have discussed with resident/NP and other care providers such as pharmacist, RN and RRT. In addition, I personally evaluated patient and elicited key findings of: rass -3 without sedation upon admission, perr sluggish, ronchi bilateral, abdo soft and seems to be tender diffuse to deep palpation, wound rt upper ext mild drainage old fistula site?,  Gangrenous LUE hand fingers the disucssio with [facility] at [facility] is over 24 hours shock, pos cdiff, hypoglycemia, refractory shock, acidosis, she arrived without copy of recent CT described or labs from [facility], we will have to obtain this, she arrives in MODS, we will treat oral vanc, IV flagyl and other empiric abodminal coverage as CT is unknown to me if had oral contrast done, levo to max 50 mic, avoid vaso with concern cdiff and mega colon and ischemia risk, neo to rmeain at 200, bicarb, cvvhd per renal likely needed, CT head , abdo , pelvis if able to safely  transfer for this, d10 to fluids may be needed, stress roids, CT ext rue if able to r/o abscess, it appears she has been in refractory MODS over 24 hours, I doubt this is surivable, family to come in , would be futile to code her    , bolus okay  The patient is critically ill with multiple organ systems failure and requires high complexity decision making for assessment and support, frequent evaluation and titration of therapies, application of advanced monitoring technologies and extensive interpretation of multiple databases.   Critical Care Time devoted to patient care services described in this note is 45 Minutes. This time reflects time of care of this signee: Merrie Roof, MD FACP. This critical care time does not reflect procedure time, or teaching time or supervisory time of PA/NP/Med student/Med Resident etc but could involve care discussion time. Rest per NP/medical resident whose note is outlined above and that I agree with   Lavon Paganini. Titus Mould, MD, [place] Pgr: [facility] 02/14/2017 4:56 PM

## 2017-03-01 NOTE — Progress Notes (Addendum)
Pharmacy Antibiotic Note  Paige Marshall is a 61 y.o. female admitted on 02/18/2017 from [facility] with sepsis and intra-abdominal infection.  Pharmacy has been consulted for Merrem dosing. Also on po and pr vancomycin and IV metronidazole for Cdiff as well as Linezolid with recent history of VRE bacteremia. Patient has CKD stage 5 on HD at [facility]. Possible CRRT.  Plan: Merrem 500mg  IV every 24 hours.  F/up HD vs CRRT plan.      Temp (24hrs), Avg:99 F (37.2 C), Min:99 F (37.2 C), Max:99 F (37.2 C)  No results for input(s): WBC, CREATININE, LATICACIDVEN, VANCOTROUGH, VANCOPEAK, VANCORANDOM, GENTTROUGH, GENTPEAK, GENTRANDOM, TOBRATROUGH, TOBRAPEAK, TOBRARND, AMIKACINPEAK, AMIKACINTROU, AMIKACIN in the last 168 hours.  CrCl cannot be calculated (Unknown ideal weight.).    Allergies  Allergen Reactions  . Penicillins Hives, Itching, Swelling and Other (See Comments)    Tongue swelling Has patient had a PCN reaction causing immediate rash, facial/tongue/throat swelling, SOB or lightheadedness with hypotension: Yes  Clarified with the patient her penicillin allergy. Pt has tolerated cefepime and ampicillin.    . Citrus Hives    Antimicrobials this admission: Merrem 5/22 >> Linezolid 5/22 >> PO Vanc 5/21 (initiated at [facility]) >> Flagyl IV 5/21 (initiated at [facility]) >>  Dose adjustments this admission:   Microbiology results: 5/22 BCx:  5/22 UCx: 5/22 Sputum:   5/22 MRSA PCR:   Thank you for allowing pharmacy to be a part of this patient's care.  Sloan Leiter, PharmD, BCPS Clinical Pharmacist Clinical Phone 02/26/2017 until 3:30 PM - [phone] After hours, please call #[phone] 02/17/2017 12:57 PM

## 2017-03-01 NOTE — Care Management Note (Signed)
Case Management Note  Patient Details  Name: Paige Marshall MRN: [id number] Date of Birth: 1955-12-16  Subjective/Objective:     Admitted with shock, C diff, and not HD well at [facility]  will need CRRT             Action/Plan:  PTA at [place] following    Expected Discharge Date:                  Expected Discharge Plan:     In-House Referral:  Clinical Social Work  Discharge planning Services  CM Consult  Post Acute Care Choice:    Choice offered to:     DME Arranged:    DME Agency:     HH Arranged:    [place] Agency:     Status of Service:     If discussed at [employer], dates discussed:    Additional Comments:  Maryclare Labrador, RN 02/28/2017, 4:51 PM

## 2017-03-01 NOTE — Progress Notes (Signed)
ABG drawn from [facility], critical results called to [place]. No changes per MD at this time. RN aware.

## 2017-03-02 ENCOUNTER — Encounter (HOSPITAL_COMMUNITY): Payer: Self-pay | Admitting: *Deleted

## 2017-03-02 DIAGNOSIS — A419 Sepsis, unspecified organism: Principal | ICD-10-CM

## 2017-03-02 DIAGNOSIS — L8990 Pressure ulcer of unspecified site, unspecified stage: Secondary | ICD-10-CM | POA: Insufficient documentation

## 2017-03-02 DIAGNOSIS — R6521 Severe sepsis with septic shock: Secondary | ICD-10-CM

## 2017-03-02 LAB — CBC
HCT: 32.2 % — ABNORMAL LOW (ref 36.0–46.0)
HEMOGLOBIN: 9.1 g/dL — AB (ref 12.0–15.0)
MCH: 26 pg (ref 26.0–34.0)
MCHC: 28.3 g/dL — ABNORMAL LOW (ref 30.0–36.0)
MCV: 92 fL (ref 78.0–100.0)
Platelets: 85 10*3/uL — ABNORMAL LOW (ref 150–400)
RBC: 3.5 MIL/uL — AB (ref 3.87–5.11)
RDW: 29.4 % — ABNORMAL HIGH (ref 11.5–15.5)
WBC: 24.8 10*3/uL — AB (ref 4.0–10.5)

## 2017-03-02 LAB — GLUCOSE, CAPILLARY
GLUCOSE-CAPILLARY: 160 mg/dL — AB (ref 65–99)
GLUCOSE-CAPILLARY: 178 mg/dL — AB (ref 65–99)
GLUCOSE-CAPILLARY: 49 mg/dL — AB (ref 65–99)
GLUCOSE-CAPILLARY: 58 mg/dL — AB (ref 65–99)
GLUCOSE-CAPILLARY: 59 mg/dL — AB (ref 65–99)
GLUCOSE-CAPILLARY: 65 mg/dL (ref 65–99)
GLUCOSE-CAPILLARY: 67 mg/dL (ref 65–99)
GLUCOSE-CAPILLARY: 77 mg/dL (ref 65–99)
GLUCOSE-CAPILLARY: 77 mg/dL (ref 65–99)
GLUCOSE-CAPILLARY: 80 mg/dL (ref 65–99)
GLUCOSE-CAPILLARY: 84 mg/dL (ref 65–99)
Glucose-Capillary: 103 mg/dL — ABNORMAL HIGH (ref 65–99)
Glucose-Capillary: 151 mg/dL — ABNORMAL HIGH (ref 65–99)
Glucose-Capillary: 132 mg/dL — ABNORMAL HIGH (ref 65–99)
Glucose-Capillary: 88 mg/dL (ref 65–99)
Glucose-Capillary: 71 mg/dL (ref 65–99)
Glucose-Capillary: 75 mg/dL (ref 65–99)
Glucose-Capillary: 79 mg/dL (ref 65–99)
Glucose-Capillary: 50 mg/dL — ABNORMAL LOW (ref 65–99)
Glucose-Capillary: 79 mg/dL (ref 65–99)

## 2017-03-02 LAB — RENAL FUNCTION PANEL
ALBUMIN: 1.2 g/dL — AB (ref 3.5–5.0)
ALBUMIN: 1.2 g/dL — AB (ref 3.5–5.0)
ANION GAP: 19 — AB (ref 5–15)
Anion gap: 25 — ABNORMAL HIGH (ref 5–15)
BUN: 34 mg/dL — ABNORMAL HIGH (ref 6–20)
BUN: 22 mg/dL — ABNORMAL HIGH (ref 6–20)
CO2: 15 mmol/L — ABNORMAL LOW (ref 22–32)
CO2: 14 mmol/L — ABNORMAL LOW (ref 22–32)
Calcium: 7 mg/dL — ABNORMAL LOW (ref 8.9–10.3)
Calcium: 6.9 mg/dL — ABNORMAL LOW (ref 8.9–10.3)
Chloride: 93 mmol/L — ABNORMAL LOW (ref 101–111)
Chloride: 94 mmol/L — ABNORMAL LOW (ref 101–111)
Creatinine, Ser: 2.75 mg/dL — ABNORMAL HIGH (ref 0.44–1.00)
Creatinine, Ser: 2.01 mg/dL — ABNORMAL HIGH (ref 0.44–1.00)
GFR calc non Af Amer: 18 mL/min — ABNORMAL LOW (ref >60)
GFR, EST AFRICAN AMERICAN: 21 mL/min — AB (ref >60)
GFR, EST AFRICAN AMERICAN: 30 mL/min — AB (ref >60)
GFR, EST NON AFRICAN AMERICAN: 26 mL/min — AB (ref >60)
GLUCOSE: 68 mg/dL (ref 65–99)
Glucose, Bld: 60 mg/dL — ABNORMAL LOW (ref 65–99)
PHOSPHORUS: 2.9 mg/dL (ref 2.5–4.6)
Phosphorus: 2.6 mg/dL (ref 2.5–4.6)
Potassium: 3.9 mmol/L (ref 3.5–5.1)
Potassium: 3.8 mmol/L (ref 3.5–5.1)
SODIUM: 127 mmol/L — AB (ref 135–145)
Sodium: 133 mmol/L — ABNORMAL LOW (ref 135–145)

## 2017-03-02 LAB — BLOOD GAS, ARTERIAL
Acid-base deficit: 10.9 mmol/L — ABNORMAL HIGH (ref 0.0–2.0)
Bicarbonate: 15.1 mmol/L — ABNORMAL LOW (ref 20.0–28.0)
DRAWN BY: 10006
FIO2: 70
MECHVT: 550 mL
O2 Saturation: 98.6 %
PEEP: 5 cmH2O
PO2 ART: 123 mmHg — AB (ref 83.0–108.0)
Patient temperature: 97.9
RATE: 30 resp/min
pCO2 arterial: 35.6 mmHg (ref 32.0–48.0)
pH, Arterial: 7.247 — ABNORMAL LOW (ref 7.350–7.450)

## 2017-03-02 LAB — MAGNESIUM: Magnesium: 2 mg/dL (ref 1.7–2.4)

## 2017-03-02 LAB — POCT I-STAT 3, ART BLOOD GAS (G3+)
ACID-BASE DEFICIT: 15 mmol/L — AB (ref 0.0–2.0)
BICARBONATE: 12.7 mmol/L — AB (ref 20.0–28.0)
O2 Saturation: 94 %
PH ART: 7.174 — AB (ref 7.350–7.450)
TCO2: 14 mmol/L (ref 0–100)
pCO2 arterial: 34.6 mmHg (ref 32.0–48.0)
pO2, Arterial: 91 mmHg (ref 83.0–108.0)

## 2017-03-02 LAB — TROPONIN I: TROPONIN I: 0.07 ng/mL — AB (ref <0.03)

## 2017-03-02 LAB — LACTIC ACID, PLASMA: LACTIC ACID, VENOUS: 14.5 mmol/L — AB (ref 0.5–1.9)

## 2017-03-02 MED ORDER — DEXTROSE 50 % IV SOLN
1.0000 | Freq: Once | INTRAVENOUS | Status: AC
  Administered 2017-03-02: 50 mL via INTRAVENOUS

## 2017-03-02 MED ORDER — DEXTROSE 50 % IV SOLN
25.0000 mL | Freq: Once | INTRAVENOUS | Status: AC
  Administered 2017-03-02: 25 mL via INTRAVENOUS
  Filled 2017-03-02: qty 50

## 2017-03-02 MED ORDER — IPRATROPIUM-ALBUTEROL 0.5-2.5 (3) MG/3ML IN SOLN: 3.0000 mL | RESPIRATORY_TRACT | Status: DC | PRN

## 2017-03-02 MED ORDER — DEXTROSE 10 % IV SOLN
INTRAVENOUS | Status: DC
  Administered 2017-03-02 – 2017-03-03 (×2): via INTRAVENOUS

## 2017-03-02 MED ORDER — DEXTROSE 50 % IV SOLN
1.0000 | INTRAVENOUS | Status: DC | PRN
  Administered 2017-03-02 – 2017-03-04 (×11): 50 mL via INTRAVENOUS
  Filled 2017-03-02 (×11): qty 50

## 2017-03-02 MED ORDER — SILVER SULFADIAZINE 1 % EX CREA
TOPICAL_CREAM | Freq: Every day | CUTANEOUS | Status: DC
  Administered 2017-03-02 – 2017-03-04 (×3): via TOPICAL
  Filled 2017-03-02: qty 85

## 2017-03-02 MED ORDER — DEXTROSE 50 % IV SOLN
INTRAVENOUS | Status: AC
  Filled 2017-03-02: qty 50

## 2017-03-02 NOTE — Progress Notes (Signed)
PULMONARY / CRITICAL CARE MEDICINE   Name: Paige Marshall MRN: [id number] DOB: 07-Jun-1956    ADMISSION DATE:  03/10/2017 CONSULTATION DATE:  02/21/2017  [facility]ING MD:  [facility]  CHIEF COMPLAINT:  Cdiff  HISTORY OF PRESENT ILLNESS:   61 year old female, [facility] resident, who presented to [facility] on 5/22 with concern for septic shock in the setting of C-Diff.    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  She was recently admitted from 12/26/16 - 02/10/17 for acute on chronic hypoxic/hypercapnic respiratory failure, AFwRVR, subdural hematoma / ICH, VRE bacteremia, adrenal insufficiency.  She was discharged to [facility] on 02/10/17 for further rehab efforts.    On 5/22, she returned to [facility] with worsening acidosis, C-Diff infection with subsequent septic shock.  The patient had not been tolerating regular HD and there was also concern for possible need of HD.  Admit ABG 7.154 / 45.6 / 180 / 16.    SUBJECTIVE:   Remains on pressors, CRRT, full vent support.  VITAL SIGNS: BP (!) 76/42   Pulse (!) 132   Temp 97.7 F (36.5 C) (Axillary)   Resp (!) 31   Wt (!) 303 lb 12.7 oz (137.8 kg)   LMP 09/07/2011   SpO2 (!) 81%   BMI 46.19 kg/m   HEMODYNAMICS: CVP:  [14 mmHg] 14 mmHg  VENTILATOR SETTINGS: Vent Mode: PRVC FiO2 (%):  [50 %-100 %] 50 % Set Rate:  [24 bmp-30 bmp] 30 bmp Vt Set:  [550 mL] 550 mL PEEP:  [5 cmH20] 5 cmH20 Plateau Pressure:  [26 cmH20-29 cmH20] 28 cmH20  INTAKE / OUTPUT: I/O last 3 completed shifts: In: 5606.1 [I.V.:4102.1; Other:200; IV UMPNTIRWE:3154] Out: 3158 [Emesis/NG output:550; MGQQP:6195; Stool:200]  PHYSICAL EXAMINATION: General:  MOAAF sedated on vent Neuro:  Sedated and non responsive HEENT: OTT-> vent, NGT-> suction Cardiovascular:  HSR RRR Lungs:Coarse rhonchi  bilaterally Abdomen: Obese soft no bs Musculoskeletal:  Rt av fistula with pulse. Rt finger necrotic tips Skin:  Cool, dusky   General - unresponsive Eyes - injected sclera ENT - ETT in place Cardiac - irregular, no murmur Chest - decreased BS, no wheeze Abd - soft, absent bowel sounds, non tender Ext - 1+ edema Skin - gangrene Lt hand Neuro - not following commands   LABS:  BMET  Recent Labs Lab 02/16/2017 1526 02/16/2017 2155 03/02/17 0400  NA 128* 123* 127*  K 2.4* 3.9 3.9  CL 108 93* 93*  CO2 13* 17* 15*  BUN 40* 44* 34*  CREATININE 2.91* 3.43* 2.75*  GLUCOSE 87 105* 68    Electrolytes  Recent Labs Lab 02/09/2017 1328 02/18/2017 1526 02/28/2017 2155 03/02/17 0400  CALCIUM  --  5.3* 7.3* 7.0*  MG 0.8*  --   --  2.0  PHOS 1.6*  --  2.7 2.6    CBC  Recent Labs Lab 02/22/2017 1329 03/02/17 0400  WBC 23.3* 24.8*  HGB 8.8* 9.1*  HCT 30.3* 32.2*  PLT 84* 85*    Coag's  Recent Labs Lab 03/03/2017 1329  APTT 99*  INR 2.48    Sepsis Markers  Recent Labs Lab 02/23/2017 1328 03/05/2017 1329  LATICACIDVEN  --  4.7*  PROCALCITON 7.12  --     ABG  Recent Labs Lab 02/21/2017 1321 02/24/2017 1751 03/02/17 0300  PHART 7.154* 7.156* 7.247*  PCO2ART 45.6 42.3 35.6  PO2ART 180.0* 108.0 123*    Liver Enzymes  Recent Labs Lab 02/20/2017 2155 03/02/17 0400  ALBUMIN 1.2* 1.2*    Cardiac Enzymes  Recent Labs Lab 02/23/2017 1329 03/02/17 0400  TROPONINI 0.19* 0.07*    Glucose  Recent Labs Lab 03/02/17 0158 03/02/17 0357 03/02/17 0601 03/02/17 0615 03/02/17 0808 03/02/17 0820  GLUCAP 84 80 58* 103* 49* 151*    Imaging Dg Chest Port 1 View  Result Date: 02/21/2017 CLINICAL DATA:  Knee lead minute patient.  Intubated. EXAM: PORTABLE CHEST 1 VIEW COMPARISON:  Single-view of the chest 01/27/2017. FINDINGS: Endotracheal tube is in place with the tip at the level of clavicular heads well above the carina. NG tube courses into the stomach and below  the inferior margin of the film. Left IJ catheter tip projects in the mid to lower superior vena cava. Right IJ approach dialysis catheter tip at projects in the right atrium. There is marked cardiomegaly. Patchy bilateral airspace disease is seen the mid and lower lung zones and appears worse on the comparison exam. There is interstitial edema. No pneumothorax. IMPRESSION: Support tubes and lines projecting good position. Worsened patchy bilateral airspace disease appears most intense in the bases is worrisome for pneumonia superimposed on edema. Electronically Signed   By: Inge Rise M.D.   On: 03/02/2017 13:18   Dg Abd Portable 1v  Result Date: 03/03/2017 CLINICAL DATA:  Nasogastric tube placement. EXAM: PORTABLE ABDOMEN - 1 VIEW COMPARISON:  Abdominal radiograph January 25, 2017 FINDINGS: Nasogastric tube tip projects in mid stomach, contrast or debris within the proximal stomach. Paucity of bowel gas. Soft tissue planes and included osseous structures are nonsuspicious, large body habitus. IMPRESSION: Nasogastric tube tip projects in mid stomach. Electronically Signed   By: Elon Alas M.D.   On: 03/03/2017 16:21    STUDIES:    CULTURES: Tracheal Aspirate 5/22 >>  BCx2 5/22 >>  UC 5/22 >>  CDiff 5/22 >>  ANTIBIOTICS: Linezolid 5/22 >>  Meropenem 5/22 >> Flagyl 5/22 >> Vanco (oral) 5/22 >>  Vanco (rectal) 5/22 >>  SIGNIFICANT EVENTS: 5/22 Admit from [facility] with septic shock, C-Diff   LINES/TUBES: ETT 5/20 ([facility])>>  L IJ tunneled PICC >> R Fem TLC ([facility]) >> RUE AVF >>     DISCUSSION: 61 y/o F admitted 5/22 from [facility] with septic shock in the setting of C-Diff, HCAP.   ASSESSMENT / PLAN:  Septic shock likely from C diff colitis and HCAP. Gangrene Lt hand, AV fistula wound. - continue Abx - pressors to keep MAP > 65 - wound care  Acute on chronic hypoxic/hypercapnic respiratory failure in setting of septic shock and HCAP. Hx of COPD, OSA. -  full vent support - f/u CXR, ABG - PRN BDs  Hx of non ischemic CM >> EF 30% on echo from 02/10/17. Elevated troponin from demand ischemia. Hx of A fib. - monitor hemodynamics  ESRD. Metabolic acidosis. Lactic acidosis. - CRRT per nephrology  Moderate protein calorie malnutrition. - consider adding tube feeds in next 24 hrs  Anemia of critical illness and chronic disease. Thrombocytopenia in setting of sepsis. - f/u CBC  Acute metabolic encephalopathy. - monitor mental status - not stable enough to got to radiology for CT head  DM. Relative adrenal insufficiency. - SSI - continue solu cortef  DVT prophylaxis - SCDs SUP - Pepcid Nutrition - NPO Goals of care - Full Code. Sister Waynette Buttery, [phone]) updated via phone 5/22. Pt's daughter Audelia Hives  [place], [phone]).   CC time 42 minutes  D/w Dr. Marcellus Scott, MD [place] 03/02/2017, 9:02 AM Pager:  [phone] After 3pm call: [phone]

## 2017-03-02 NOTE — Progress Notes (Signed)
[facility] ROUNDING NOTE   Subjective:   Interval History: 61 year old female, [facility] resident, who presented to [facility] on 5/22 with concern for septic shock in the setting of CDiff  At baseline, the patient has a complex medical history to included chronic combined CHF, NICM, permanent AF, CKD / ESRD on HD M/W/F, former smoker, COPD, PH, DJD, HTN, morbid obesity, OSA, CVA and rheumatoid arthritis. She was recently admitted from 12/26/16 - 02/10/17 for acute on chronic hypoxic/hypercapnic respiratory failure, AFwRVR, subdural hematoma / ICH, VRE bacteremia, adrenal insufficiency. She was discharged to [facility] on 02/10/17 for further rehab efforts.   Appears stable on CRRT   Objective:  Vital signs in last 24 hours:  Temp:  [94.4 F (34.7 C)-99 F (37.2 C)] 97.7 F (36.5 C) (05/23 0734) Pulse Rate:  [31-143] 132 (05/23 0830) Resp:  [13-32] 31 (05/23 0830) BP: (33-111)/(21-60) 76/42 (05/23 0815) SpO2:  [81 %-100 %] 81 % (05/23 0830) Arterial Line BP: (90-139)/(18-60) 121/44 (05/23 0830) FiO2 (%):  [50 %-100 %] 50 % (05/23 0800) Weight:  [299 lb 9.7 oz (135.9 kg)-303 lb 12.7 oz (137.8 kg)] 303 lb 12.7 oz (137.8 kg) (05/23 0800)  Weight change:  Filed Weights   03/09/2017 1418 03/02/17 0500 03/02/17 0800  Weight: 299 lb 9.7 oz (135.9 kg) (!) 303 lb 12.7 oz (137.8 kg) (!) 303 lb 12.7 oz (137.8 kg)    Intake/Output: I/O last 3 completed shifts: In: 5606.1 [I.V.:4102.1; Other:200; IV [id number]:7121] Out: 3158 [Emesis/NG output:550; FXJOI:3254; Stool:200]   Intake/Output this shift:  Total I/O In: 293.2 [I.V.:293.2] Out: -   CVS- RRR RS- CTA intubated  ABD- BS present soft non-distended  Obese  EXT- no edema   Basic Metabolic Panel:  Recent Labs Lab 02/13/2017 1328 02/19/2017 1526 02/19/2017 2155 03/02/17 0400  NA  --  128* 123* 127*  K  --  2.4* 3.9 3.9  CL  --  108 93* 93*  CO2  --  13* 17* 15*  GLUCOSE  --  87 105* 68  BUN  --  40* 44* 34*   CREATININE  --  2.91* 3.43* 2.75*  CALCIUM  --  5.3* 7.3* 7.0*  MG 0.8*  --   --  2.0  PHOS 1.6*  --  2.7 2.6    Liver Function Tests:  Recent Labs Lab 02/18/2017 2155 03/02/17 0400  ALBUMIN 1.2* 1.2*    Recent Labs Lab 02/10/2017 1329  LIPASE 15  AMYLASE 68   No results for input(s): AMMONIA in the last 168 hours.  CBC:  Recent Labs Lab 02/08/2017 1329 03/02/17 0400  WBC 23.3* 24.8*  HGB 8.8* 9.1*  HCT 30.3* 32.2*  MCV 94.7 92.0  PLT 84* 85*    Cardiac Enzymes:  Recent Labs Lab 02/13/2017 1329 03/02/17 0400  TROPONINI 0.19* 0.07*    BNP: Invalid input(s): POCBNP  CBG:  Recent Labs Lab 03/02/17 0357 03/02/17 0601 03/02/17 0615 03/02/17 0808 03/02/17 0820  GLUCAP 80 18* 103* 24* 151*    Microbiology: Results for orders placed or performed during the hospital encounter of 03/06/2017  MRSA PCR Screening     Status: None   Collection Time: 03/08/2017 12:37 PM  Result Value Ref Range Status   MRSA by PCR NEGATIVE NEGATIVE Final    Comment:        The GeneXpert MRSA Assay (FDA approved for NASAL specimens only), is one component of a comprehensive MRSA colonization surveillance program. It is not intended to diagnose MRSA infection  nor to guide or monitor treatment for MRSA infections.   Culture, respiratory (tracheal aspirate)     Status: None (Preliminary result)   Collection Time: 02/17/2017  1:00 PM  Result Value Ref Range Status   Specimen Description TRACHEAL ASPIRATE  Final   Special Requests NONE  Final   Gram Stain   Final    MODERATE WBC PRESENT,BOTH PMN AND MONONUCLEAR ABUNDANT GRAM VARIABLE ROD    Culture PENDING  Incomplete   Report Status PENDING  Incomplete    Coagulation Studies:  Recent Labs  02/14/2017 1329  LABPROT 27.3*  INR 2.48    Urinalysis: No results for input(s): COLORURINE, LABSPEC, PHURINE, GLUCOSEU, HGBUR, BILIRUBINUR, KETONESUR, PROTEINUR, UROBILINOGEN, NITRITE, LEUKOCYTESUR in the last 72 hours.  Invalid  input(s): APPERANCEUR    Imaging: Dg Chest Port 1 View  Result Date: 02/19/2017 CLINICAL DATA:  Knee lead minute patient.  Intubated. EXAM: PORTABLE CHEST 1 VIEW COMPARISON:  Single-view of the chest 01/27/2017. FINDINGS: Endotracheal tube is in place with the tip at the level of clavicular heads well above the carina. NG tube courses into the stomach and below the inferior margin of the film. Left IJ catheter tip projects in the mid to lower superior vena cava. Right IJ approach dialysis catheter tip at projects in the right atrium. There is marked cardiomegaly. Patchy bilateral airspace disease is seen the mid and lower lung zones and appears worse on the comparison exam. There is interstitial edema. No pneumothorax. IMPRESSION: Support tubes and lines projecting good position. Worsened patchy bilateral airspace disease appears most intense in the bases is worrisome for pneumonia superimposed on edema. Electronically Signed   By: Inge Rise M.D.   On: 02/21/2017 13:18   Dg Abd Portable 1v  Result Date: 03/06/2017 CLINICAL DATA:  Nasogastric tube placement. EXAM: PORTABLE ABDOMEN - 1 VIEW COMPARISON:  Abdominal radiograph January 25, 2017 FINDINGS: Nasogastric tube tip projects in mid stomach, contrast or debris within the proximal stomach. Paucity of bowel gas. Soft tissue planes and included osseous structures are nonsuspicious, large body habitus. IMPRESSION: Nasogastric tube tip projects in mid stomach. Electronically Signed   By: Elon Alas M.D.   On: 03/06/2017 16:21     Medications:   . sodium chloride    . famotidine (PEPCID) IV Stopped (03/08/2017 2214)  . fentaNYL infusion INTRAVENOUS Stopped (02/10/2017 1300)  . linezolid (ZYVOX) IV Stopped (03/02/17 0357)  . meropenem (MERREM) IV Stopped (03/02/17 0130)  . metronidazole Stopped (03/02/17 0256)  . norepinephrine (LEVOPHED) Adult infusion 50.027 mcg/min (03/02/17 0800)  . phenylephrine (NEO-SYNEPHRINE) Adult infusion 400  mcg/min (03/02/17 0800)  . dialysate (PRISMASATE) 2,000 mL/hr at 03/02/17 0746  .  sodium bicarbonate  infusion 1000 mL 75 mL/hr at 03/02/17 0800  . sodium bicarbonate (isotonic) 1000 mL infusion 125 mL/hr at 03/02/17 0826  . sodium bicarbonate (isotonic) 1000 mL infusion 150 mL/hr at 03/02/17 0647  . sodium chloride    . vasopressin (PITRESSIN) infusion - *FOR SHOCK* 0.03 Units/min (03/02/17 0800)   . chlorhexidine gluconate (MEDLINE KIT)  15 mL Mouth Rinse BID  . fentaNYL (SUBLIMAZE) injection  50 mcg Intravenous Once  . hydrocortisone sod succinate (SOLU-CORTEF) inj  50 mg Intravenous Q6H  . mouth rinse  15 mL Mouth Rinse 10 times per day  . silver sulfADIAZINE   Topical Daily  . vancomycin  500 mg Per Tube Q6H  . vancomycin (VANCOCIN) rectal ENEMA  500 mg Rectal Q6H   sodium chloride, fentaNYL, heparin, sodium chloride  Assessment/ Plan:  End stage renal disease patient usually MWF transferred in septic shock to  from [facility]  Labs are pending but with the hemodynamic instabilty and sepsis we shall plan on CRRT . Prognosis is poor. Possible toxic megacolon   Systolic and diastolic heart failure  Pressor dependent shock   Adrenal insufficiency   COPD  Gout   Persistent atrial fibrillation  Rheumatoid arthritis  Patient seen on CRRT  No heparin   Keep even on dialysis  Labs stable no changes  Continue IV bicarbonate for now  Discussed with Dr Halford Chessman   Ongoing discussions regarding goals of care      LOS: 1 Corie Allis W @TODAY @8 :54 AM

## 2017-03-02 NOTE — Progress Notes (Signed)
[facility] Progress Note Patient Name: Paige Marshall DOB: [birth year] MRN: [id number]   Date of Service  03/02/2017  HPI/Events of Note  Hypotension - BP = 119/34 with MAP = 60.   eICU Interventions  Will order: 1. Monitor CVP. 2. ABG now. 3. Increase ceiling on Norepinephrine IV infusion to 60 mcg/min.      Intervention Category Major Interventions: Hypotension - evaluation and management  Lexis Potenza [place] 03/02/2017, 2:44 AM

## 2017-03-02 NOTE — Progress Notes (Signed)
Hypoglycemic Event  CBG: 58  Treatment: D50 IV 25 mL  Symptoms: None  Follow-up CBG: FEXM:1470 CBG Result:103  Possible Reasons for Event: Unknown      Christie Beckers

## 2017-03-02 NOTE — Consult Note (Addendum)
[facility] Nurse wound consult note Reason for Consult: Consult requested for multiple wounds. Pt is critically ill on vent and CVVHD and pressiors ; it will be difficult to promote healing related to multiple systemic factors.  Flexiseal in place to attempt to contain stool. Left 1st finger with dry eschar; 4X1cm Left thumb with dry eschar; 5X2cm Left anterior hand with dry eschar in patchy areas; 5X5cm Right heel with darker-colored deep tissue injury; 3X5cm Right posterior leg with full thickness wounds; 2X10cm, 100% soft eschar, sm amt tan drainage, no odor Right posterior leg with 2 areas of 100% yellow slough; .5X.5 cm and .8X.8cm Right arm with full thickness wounds; .3X1X.2cm, 100% yellow slough and 4X1X.3cm, 100% slough/eschar, mod amt tan drainage Sacrum unstageable wound: 4X1X.3cm, 100% yellow slough, mod amt tan drainage Left buttock unstageable wound; 1.5X1.5cm, 100% slough/eschar, mod amt tan drainage Right buttock unstageable wound; 1.8X1.8cm, 100% slough/eschar, mod amt tan drainage Pressure Injury POA: Yes Dressing procedure/placement/frequency: If aggressive plan of care is desired, then please consult hand surgeon for necrotic areas on left fingers/hand.  Float heels to reduce pressure.  Pt is on a Sport low airloss bed to reduce pressure.  Silvadene to all wounds to assist with enzymatic debridement of nonviable tissue.  No family present to discuss plan of care. One hour spent performing this consult. Please re-consult if further assistance is needed.  Thank-you,  Julien Girt MSN, [place], [place], [place] [facility] [place], [place]

## 2017-03-02 NOTE — Progress Notes (Signed)
Hypoglycemic Event  CBG: 50  Treatment: D50 IV 50 mL  Symptoms: None  Follow-up CBG: Time:2020 CBG Result:178  Possible Reasons for Event: [place]

## 2017-03-02 NOTE — Progress Notes (Signed)
CRITICAL VALUE ALERT  Critical Value:  Lactic acid 14.5  Date & Time Notied:  03/02/17  Provider Notified: Dr Halford Chessman  Orders Received/Actions taken:  MD aware

## 2017-03-02 NOTE — Progress Notes (Signed)
[place] Progress Note Patient Name: AKOSUA CONSTANTINE DOB: [birth year] MRN: [id number]   Date of Service  03/02/2017  HPI/Events of Note  Pressors at max doses, CVVHD running. Remains in A fib, HR 120-130's. Does not appear to be in a position to tolerate rate controlling meds at this time. Consider amiodarone as we go forward.   eICU Interventions       Intervention Category Major Interventions: Other:  BYRUM,ROBERT S. 03/02/2017, 4:59 AM

## 2017-03-02 NOTE — Progress Notes (Signed)
Hypoglycemic Event  CBG: 59  Treatment: D50 IV 25 mL  Symptoms: None  Follow-up CBG: Time: 1100 CBG Result: 154  Possible Reasons for Event: Unknown  Comments/MD notified: Md notified; new order for D10    Harryette Shuart, Garnette Scheuermann

## 2017-03-02 NOTE — Progress Notes (Signed)
Hypoglycemic Event  CBG: 49  Treatment: D50 IV 50 mL  Symptoms: None  Follow-up CBG: Time: 0820 CBG Result: 151  Possible Reasons for Event: Unknown  Comments/MD notified:    Derrick Orris, Garnette Scheuermann

## 2017-03-02 NOTE — Progress Notes (Signed)
Nutrition Brief Note  Discussed patient in ICU rounds and with RN today. She was receiving TPN at [facility]. Patient has a very poor prognosis. Likely gut ischemia. No nutrition intervention planned at this time.  Please consult RD if nutrition concerns arise.  Molli Barrows, RD, LDN, [place] Pager [phone] After Hours Pager [phone]

## 2017-03-02 NOTE — Progress Notes (Signed)
Hypoglycemic Event  CBG: 65  Treatment: D50 IV 50 mL  Symptoms: None  Follow-up CBG: Time: 1600 CBG Result: 160  Possible Reasons for Event: Unknown  Comments/MD notified:    Paige Marshall, Garnette Scheuermann

## 2017-03-03 ENCOUNTER — Inpatient Hospital Stay (HOSPITAL_COMMUNITY): Payer: Medicare Other

## 2017-03-03 LAB — CBC
HEMATOCRIT: 32.1 % — AB (ref 36.0–46.0)
HEMOGLOBIN: 9.3 g/dL — AB (ref 12.0–15.0)
MCH: 27.3 pg (ref 26.0–34.0)
MCHC: 29 g/dL — ABNORMAL LOW (ref 30.0–36.0)
MCV: 94.1 fL (ref 78.0–100.0)
Platelets: 60 10*3/uL — ABNORMAL LOW (ref 150–400)
RBC: 3.41 MIL/uL — AB (ref 3.87–5.11)
RDW: 30.5 % — ABNORMAL HIGH (ref 11.5–15.5)
WBC: 33.4 10*3/uL — ABNORMAL HIGH (ref 4.0–10.5)

## 2017-03-03 LAB — RENAL FUNCTION PANEL
ALBUMIN: 1.2 g/dL — AB (ref 3.5–5.0)
Albumin: 1.1 g/dL — ABNORMAL LOW (ref 3.5–5.0)
Anion gap: 27 — ABNORMAL HIGH (ref 5–15)
Anion gap: 30 — ABNORMAL HIGH (ref 5–15)
BUN: 16 mg/dL (ref 6–20)
BUN: 12 mg/dL (ref 6–20)
CHLORIDE: 93 mmol/L — AB (ref 101–111)
CO2: 13 mmol/L — ABNORMAL LOW (ref 22–32)
CO2: 11 mmol/L — AB (ref 22–32)
CREATININE: 1.26 mg/dL — AB (ref 0.44–1.00)
Calcium: 6.5 mg/dL — ABNORMAL LOW (ref 8.9–10.3)
Calcium: 6.5 mg/dL — ABNORMAL LOW (ref 8.9–10.3)
Chloride: 94 mmol/L — ABNORMAL LOW (ref 101–111)
Creatinine, Ser: 1.56 mg/dL — ABNORMAL HIGH (ref 0.44–1.00)
GFR calc Af Amer: 41 mL/min — ABNORMAL LOW (ref >60)
GFR calc non Af Amer: 46 mL/min — ABNORMAL LOW (ref >60)
GFR, EST AFRICAN AMERICAN: 53 mL/min — AB (ref >60)
GFR, EST NON AFRICAN AMERICAN: 36 mL/min — AB (ref >60)
GLUCOSE: 71 mg/dL (ref 65–99)
Glucose, Bld: 73 mg/dL (ref 65–99)
PHOSPHORUS: 2.8 mg/dL (ref 2.5–4.6)
POTASSIUM: 3.5 mmol/L (ref 3.5–5.1)
Phosphorus: 3.3 mg/dL (ref 2.5–4.6)
Potassium: 3.7 mmol/L (ref 3.5–5.1)
SODIUM: 135 mmol/L (ref 135–145)
Sodium: 133 mmol/L — ABNORMAL LOW (ref 135–145)

## 2017-03-03 LAB — GLUCOSE, CAPILLARY
GLUCOSE-CAPILLARY: 101 mg/dL — AB (ref 65–99)
GLUCOSE-CAPILLARY: 101 mg/dL — AB (ref 65–99)
GLUCOSE-CAPILLARY: 115 mg/dL — AB (ref 65–99)
GLUCOSE-CAPILLARY: 154 mg/dL — AB (ref 65–99)
GLUCOSE-CAPILLARY: 170 mg/dL — AB (ref 65–99)
GLUCOSE-CAPILLARY: 66 mg/dL (ref 65–99)
GLUCOSE-CAPILLARY: 69 mg/dL (ref 65–99)
GLUCOSE-CAPILLARY: 71 mg/dL (ref 65–99)
GLUCOSE-CAPILLARY: 73 mg/dL (ref 65–99)
GLUCOSE-CAPILLARY: 75 mg/dL (ref 65–99)
GLUCOSE-CAPILLARY: 81 mg/dL (ref 65–99)
GLUCOSE-CAPILLARY: 99 mg/dL (ref 65–99)
Glucose-Capillary: 78 mg/dL (ref 65–99)
Glucose-Capillary: 60 mg/dL — ABNORMAL LOW (ref 65–99)
Glucose-Capillary: 83 mg/dL (ref 65–99)
Glucose-Capillary: 129 mg/dL — ABNORMAL HIGH (ref 65–99)
Glucose-Capillary: 62 mg/dL — ABNORMAL LOW (ref 65–99)
Glucose-Capillary: 87 mg/dL (ref 65–99)
Glucose-Capillary: 184 mg/dL — ABNORMAL HIGH (ref 65–99)
Glucose-Capillary: 64 mg/dL — ABNORMAL LOW (ref 65–99)
Glucose-Capillary: 78 mg/dL (ref 65–99)
Glucose-Capillary: 70 mg/dL (ref 65–99)
Glucose-Capillary: 121 mg/dL — ABNORMAL HIGH (ref 65–99)

## 2017-03-03 LAB — BLOOD GAS, ARTERIAL
ACID-BASE DEFICIT: 12 mmol/L — AB (ref 0.0–2.0)
BICARBONATE: 13.8 mmol/L — AB (ref 20.0–28.0)
Drawn by: 418751
FIO2: 50
LHR: 30 {breaths}/min
MECHVT: 550 mL
O2 SAT: 98.2 %
PATIENT TEMPERATURE: 98.6
PCO2 ART: 32.9 mmHg (ref 32.0–48.0)
PEEP/CPAP: 5 cmH2O
PH ART: 7.246 — AB (ref 7.350–7.450)
PO2 ART: 108 mmHg (ref 83.0–108.0)

## 2017-03-03 LAB — MAGNESIUM: MAGNESIUM: 1.9 mg/dL (ref 1.7–2.4)

## 2017-03-03 MED ORDER — SODIUM CHLORIDE 0.9% FLUSH: 10.0000 mL | INTRAVENOUS | Status: DC | PRN

## 2017-03-03 MED ORDER — FENTANYL CITRATE (PF) 100 MCG/2ML IJ SOLN
50.0000 ug | INTRAMUSCULAR | Status: AC | PRN
  Administered 2017-03-03 – 2017-03-04 (×3): 50 ug via INTRAVENOUS
  Filled 2017-03-03 (×3): qty 2

## 2017-03-03 MED ORDER — FENTANYL CITRATE (PF) 100 MCG/2ML IJ SOLN
50.0000 ug | INTRAMUSCULAR | Status: DC | PRN
  Administered 2017-03-04 (×4): 50 ug via INTRAVENOUS
  Filled 2017-03-03 (×4): qty 2

## 2017-03-03 MED ORDER — VITAL HIGH PROTEIN PO LIQD: 1000.0000 mL | ORAL | Status: DC

## 2017-03-03 MED ORDER — ARTIFICIAL TEARS OPHTHALMIC OINT
TOPICAL_OINTMENT | Freq: Three times a day (TID) | OPHTHALMIC | Status: DC
  Administered 2017-03-03: 1 via OPHTHALMIC
  Administered 2017-03-03 – 2017-03-04 (×4): via OPHTHALMIC
  Filled 2017-03-03: qty 3.5

## 2017-03-03 MED ORDER — CHLORHEXIDINE GLUCONATE CLOTH 2 % EX PADS
6.0000 | MEDICATED_PAD | Freq: Every day | CUTANEOUS | Status: DC
  Administered 2017-03-03 – 2017-03-04 (×2): 6 via TOPICAL

## 2017-03-03 MED ORDER — SODIUM CHLORIDE 0.9% FLUSH
10.0000 mL | Freq: Two times a day (BID) | INTRAVENOUS | Status: DC
  Administered 2017-03-03 – 2017-03-04 (×2): 10 mL

## 2017-03-03 NOTE — Progress Notes (Signed)
Hypoglycemic Event  CBG: 62  Treatment: D50 IV 50 mL  Symptoms: None  Follow-up CBG: Time: 1024 CBG Result: 129  Possible Reasons for Event: Unknown  Comments/MD notified: Dr Halford Chessman made aware. No new orders at this time    [place], Garnette Scheuermann

## 2017-03-03 NOTE — Progress Notes (Signed)
Hypoglycemic Event  CBG: 64  Treatment: D50 IV 50 mL  Symptoms: None  Follow-up CBG: Time: 1610 CBG Result: 184  Possible Reasons for Event: Unknown  Comments/MD notified: MD aware of continued low CBGs    Lesslie Mossa, Garnette Scheuermann

## 2017-03-03 NOTE — Progress Notes (Addendum)
[place] Progress Note Patient Name: Paige Marshall DOB: November 03, 1955 MRN: [id number]   Date of Service  03/03/2017  HPI/Events of Note  MAP 44, fluctuating blood sugar. She is maxed out on 3 pressors.   eICU Interventions  Keep I/O positive on CRRT Assess for ang 2 infusion. Bedside team notified to eval patient     Intervention Category Major Interventions: Other:  Keldon Lassen 03/03/2017, 8:24 PM

## 2017-03-03 NOTE — Progress Notes (Addendum)
PCCM INTERVAL PROGRESS NOTE  Called to bedside by EMD to evaluate patient for worsening shock. She is currently on 70mcg levophed, 440mcg neo, and shock dose vaso for suspected septic shock. With these high dose pressors her MAPs remain in the 40s. Daughter at bedside at the time of my evaluation and we discussed [place] for her mother. She is aware that her mother is critically ill and her odds of survival are poor. She has elected to make her a DNR at this time. We will raise levo cap to 60mcg. No further agents will be added at this time. We will also start a low dose fentanyl infusion at this time. She is worried that her mother may be uncomfortable. She is not ready to pursue full comfort at this time, but would like something minimal added. She is aware that this may worsen her mother's blood pressure.   APP critical care time 30 mins.    Georgann Housekeeper, AGACNP-BC [place] [facility] Pager [phone] or [phone]  03/03/2017 8:58 PM   She is on max pressors & on CRRT. Based on d/w daughter, would cap pressors at current levels  Rigoberto Noel. MD

## 2017-03-03 NOTE — Progress Notes (Signed)
PULMONARY / CRITICAL CARE MEDICINE   Name: Paige Marshall MRN: [id number] DOB: 1955/12/06    ADMISSION DATE:  02/20/2017 CONSULTATION DATE:  02/28/2017  REFERRING MD:  [facility] MD  CHIEF COMPLAINT:  Cdiff  HISTORY OF PRESENT ILLNESS:   61 year old female, [facility] resident, who presented to [facility] on 5/22 with concern for septic shock in the setting of C-Diff.    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  She was recently admitted from 12/26/16 - 02/10/17 for acute on chronic hypoxic/hypercapnic respiratory failure, AFwRVR, subdural hematoma / ICH, VRE bacteremia, adrenal insufficiency.  She was discharged to [facility] on 02/10/17 for further rehab efforts.    On 5/22, she returned to [facility] with worsening acidosis, C-Diff infection with subsequent septic shock.  The patient had not been tolerating regular HD and there was also concern for possible need of HD.  Admit ABG 7.154 / 45.6 / 180 / 16.    SUBJECTIVE:   Remains on full vent support, pressors, CRRT.  Intermittent hypoglycemia.  VITAL SIGNS: BP (!) 70/40   Pulse (!) 125   Temp 97.3 F (36.3 C) (Oral)   Resp (!) 24   Ht 5\' 8"  (1.727 m)   Wt (!) 308 lb 6.8 oz (139.9 kg)   LMP 09/07/2011   SpO2 (!) 81%   BMI 46.90 kg/m   HEMODYNAMICS: CVP:  [7 mmHg-15 mmHg] 14 mmHg  VENTILATOR SETTINGS: Vent Mode: PRVC FiO2 (%):  [50 %] 50 % Set Rate:  [30 bmp] 30 bmp Vt Set:  [550 mL] 550 mL PEEP:  [5 cmH20] 5 cmH20 Plateau Pressure:  [13 [id number] cmH20] 26 cmH20  INTAKE / OUTPUT: I/O last 3 completed shifts: In: 12599.3 [I.V.:10441.3; Other:300; NG/GT:30; IV [id number]:9735] Out: 32992 [Emesis/NG output:750; EQAST:41962; Stool:200]  PHYSICAL EXAMINATION:  General - on vent, CRRT Eyes - injected sclera ENT - ETT in place Cardiac - irregular, no  murmur Chest - no wheeze, rales Abd - soft, decreased bowel sounds Ext - 1+ edema Skin - gangrene Lt hadn Neuro - grimaces and wiggles with stimulation  LABS:  BMET  Recent Labs Lab 03/02/17 0400 03/02/17 1602 03/03/17 0430  NA 127* 133* 133*  K 3.9 3.8 3.5  CL 93* 94* 93*  CO2 15* 14* 13*  BUN 34* 22* 16  CREATININE 2.75* 2.01* 1.56*  GLUCOSE 68 60* 73    Electrolytes  Recent Labs Lab 03/03/2017 1328  03/02/17 0400 03/02/17 1602 03/03/17 0428 03/03/17 0430  CALCIUM  --   < > 7.0* 6.9*  --  6.5*  MG 0.8*  --  2.0  --  1.9  --   PHOS 1.6*  < > 2.6 2.9  --  2.8  < > = values in this interval not displayed.  CBC  Recent Labs Lab 02/13/2017 1329 03/02/17 0400 03/03/17 0428  WBC 23.3* 24.8* 33.4*  HGB 8.8* 9.1* 9.3*  HCT 30.3* 32.2* 32.1*  PLT 84* 85* 60*    Coag's  Recent Labs Lab 02/23/2017 1329  APTT 99*  INR 2.48    Sepsis Markers  Recent Labs Lab 02/19/2017 1328 03/05/2017 1329 03/02/17 0842  LATICACIDVEN  --  4.7* 14.5*  PROCALCITON 7.12  --   --     ABG  Recent Labs Lab 03/02/17 0300 03/02/17 1851 03/03/17 0320  PHART 7.247* 7.174*  7.246*  PCO2ART 35.6 34.6 32.9  PO2ART 123* 91.0 108    Liver Enzymes  Recent Labs Lab 03/02/17 0400 03/02/17 1602 03/03/17 0430  ALBUMIN 1.2* 1.2* 1.2*    Cardiac Enzymes  Recent Labs Lab 02/16/2017 1329 03/02/17 0400  TROPONINI 0.19* 0.07*    Glucose  Recent Labs Lab 03/03/17 0225 03/03/17 0431 03/03/17 0546 03/03/17 0607 03/03/17 0717 03/03/17 0818  GLUCAP 101* 78 60* 170* 101* 83    Imaging Dg Chest Port 1 View  Result Date: 03/03/2017 CLINICAL DATA:  Respiratory failure. EXAM: PORTABLE CHEST 1 VIEW COMPARISON:  02/22/2017. FINDINGS: Endotracheal tube, NG tube, left IJ central line, right IJ dialysis catheter stable position. Cardiomegaly. Interim partial clearing of bilateral pulmonary infiltrates/edema with persistent bibasilar edema and/or infiltrates. Small left pleural  effusion. No pneumothorax. IMPRESSION: 1. Lines and tubes in stable position. 2. Cardiomegaly with interim partial clearing of bilateral pulmonary infiltrates/edema. Persistent bibasilar infiltrates and or edema. Small left pleural effusion. Electronically Signed   By: Marcello Moores  Register   On: 03/03/2017 06:27    STUDIES:    CULTURES: Tracheal Aspirate 5/22 >>  BCx2 5/22 >>  UC 5/22 >>  CDiff 5/22 >>  ANTIBIOTICS: Linezolid 5/22 >>  Meropenem 5/22 >> Flagyl 5/22 >> 5/24 Vanco (oral) 5/22 >> 5/24 Vanco (rectal) 5/22 >>  SIGNIFICANT EVENTS: 5/22 Admit from [facility] with septic shock, C-Diff   LINES/TUBES: ETT 5/20 ([facility])>>  L IJ tunneled PICC >> R Fem TLC ([facility]) >> RUE AVF >>     DISCUSSION: 61 y/o F admitted 5/22 from [facility] with septic shock in the setting of C-Diff, HCAP.   ASSESSMENT / PLAN:  Septic shock likely from C diff colitis and HCAP. Gangrene Lt hand, AV fistula wound. - narrow abx - pressors to keep MAP > 65 - wound care  Acute on chronic hypoxic/hypercapnic respiratory failure in setting of septic shock and HCAP. Hx of COPD, OSA. - full vent support - f/u CXR, ABG - prn BDs  Hx of non ischemic CM >> EF 30% on echo from 02/10/17. Elevated troponin from demand ischemia. Hx of A fib. - monitor hemodynamics  ESRD. Metabolic acidosis. Lactic acidosis. - CRRT per renal  Moderate protein calorie malnutrition. - start trickle tube feeds 5/24  Anemia of critical illness and chronic disease. Thrombocytopenia in setting of sepsis. - f/u CBC  Acute metabolic encephalopathy. - monitor mental status  DM with episodes of hypoglycemia. Relative adrenal insufficiency. - SSI - continue D10 - continue solu cortef  DVT prophylaxis - SCDs SUP - Pepcid Nutrition - NPO Goals of care - Full Code. Sister Waynette Buttery, [phone]) updated via phone 5/22. Pt's daughter Audelia Hives [place], [phone]).   CC time 32 minutes   Chesley Mires, MD [place] 03/03/2017, 9:30 AM Pager:  [phone] After 3pm call: [phone]

## 2017-03-03 NOTE — Progress Notes (Signed)
[facility] ROUNDING NOTE   Subjective:   Interval History:Interval History: 61 year old female, [facility] resident, who presented to [facility] on 5/22 with concern for septic shock in the setting of CDiff  At baseline, the patient has a complex medical history to included chronic combined CHF, NICM, permanent AF, CKD / ESRD on HD M/W/F, former smoker, COPD, PH, DJD, HTN, morbid obesity, OSA, CVA and rheumatoid arthritis. She was recently admitted from 12/26/16 - 02/10/17 for acute on chronic hypoxic/hypercapnic respiratory failure, AFwRVR, subdural hematoma / ICH, VRE bacteremia, adrenal insufficiency. She was discharged to [facility] on 02/10/17 for further rehab efforts.   Appears stable on CRRT   Objective:  Vital signs in last 24 hours:  Temp:  [97.3 F (36.3 C)-99 F (37.2 C)] 97.3 F (36.3 C) (05/24 0600) Pulse Rate:  [104-171] 125 (05/24 0730) Resp:  [14-31] 24 (05/24 0800) BP: (38-109)/(26-56) 87/56 (05/24 0800) SpO2:  [73 %-98 %] 81 % (05/24 0715) Arterial Line BP: (89-121)/(24-46) 102/31 (05/24 0800) FiO2 (%):  [50 %] 50 % (05/24 0800) Weight:  [308 lb 6.8 oz (139.9 kg)] 308 lb 6.8 oz (139.9 kg) (05/24 0300)  Weight change: 4 lb 3 oz (1.9 kg) Filed Weights   03/02/17 0500 03/02/17 0800 03/03/17 0300  Weight: (!) 303 lb 12.7 oz (137.8 kg) (!) 303 lb 12.7 oz (137.8 kg) (!) 308 lb 6.8 oz (139.9 kg)    Intake/Output: I/O last 3 completed shifts: In: 12599.3 [I.V.:10441.3; Other:300; NG/GT:30; IV [id number]:3734] Out: 28768 [Emesis/NG output:750; TLXBW:62035; Stool:200]   Intake/Output this shift:  Total I/O In: 323.2 [I.V.:313.2; Other:10] Out: 319 [Other:319]  CVS- RRR RS- CTA ABD- BS present soft non-distended EXT- no edema   Basic Metabolic Panel:  Recent Labs Lab 03/05/2017 1328  02/28/2017 1526 03/07/2017 2155 03/02/17 0400 03/02/17 1602 03/03/17 0428 03/03/17 0430  NA  --   --  128* 123* 127* 133*  --  133*  K  --   --  2.4* 3.9 3.9 3.8   --  3.5  CL  --   --  108 93* 93* 94*  --  93*  CO2  --   --  13* 17* 15* 14*  --  13*  GLUCOSE  --   --  87 105* 68 60*  --  73  BUN  --   --  40* 44* 34* 22*  --  16  CREATININE  --   --  2.91* 3.43* 2.75* 2.01*  --  1.56*  CALCIUM  --   < > 5.3* 7.3* 7.0* 6.9*  --  6.5*  MG 0.8*  --   --   --  2.0  --  1.9  --   PHOS 1.6*  --   --  2.7 2.6 2.9  --  2.8  < > = values in this interval not displayed.  Liver Function Tests:  Recent Labs Lab 02/15/2017 2155 03/02/17 0400 03/02/17 1602 03/03/17 0430  ALBUMIN 1.2* 1.2* 1.2* 1.2*    Recent Labs Lab 03/03/2017 1329  LIPASE 15  AMYLASE 68   No results for input(s): AMMONIA in the last 168 hours.  CBC:  Recent Labs Lab 02/24/2017 1329 03/02/17 0400 03/03/17 0428  WBC 23.3* 24.8* 33.4*  HGB 8.8* 9.1* 9.3*  HCT 30.3* 32.2* 32.1*  MCV 94.7 92.0 94.1  PLT 84* 85* 60*    Cardiac Enzymes:  Recent Labs Lab 03/02/2017 1329 03/02/17 0400  TROPONINI 0.19* 0.07*    BNP: Invalid input(s): POCBNP  CBG:  Recent Labs Lab 03/03/17 0225 03/03/17 0431 03/03/17 0546 03/03/17 0607 03/03/17 0717  GLUCAP 101* 78 60* 170* 101*    Microbiology: Results for orders placed or performed during the hospital encounter of 02/26/2017  MRSA PCR Screening     Status: None   Collection Time: 03/10/2017 12:37 PM  Result Value Ref Range Status   MRSA by PCR NEGATIVE NEGATIVE Final    Comment:        The GeneXpert MRSA Assay (FDA approved for NASAL specimens only), is one component of a comprehensive MRSA colonization surveillance program. It is not intended to diagnose MRSA infection nor to guide or monitor treatment for MRSA infections.   Culture, respiratory (tracheal aspirate)     Status: None (Preliminary result)   Collection Time: 02/26/2017  1:00 PM  Result Value Ref Range Status   Specimen Description TRACHEAL ASPIRATE  Final   Special Requests NONE  Final   Gram Stain   Final    MODERATE WBC PRESENT,BOTH PMN AND  MONONUCLEAR ABUNDANT GRAM VARIABLE ROD    Culture CULTURE REINCUBATED FOR BETTER GROWTH  Final   Report Status PENDING  Incomplete    Coagulation Studies:  Recent Labs  03/02/2017 1329  LABPROT 27.3*  INR 2.48    Urinalysis: No results for input(s): COLORURINE, LABSPEC, PHURINE, GLUCOSEU, HGBUR, BILIRUBINUR, KETONESUR, PROTEINUR, UROBILINOGEN, NITRITE, LEUKOCYTESUR in the last 72 hours.  Invalid input(s): APPERANCEUR    Imaging: Dg Chest Port 1 View  Result Date: 03/03/2017 CLINICAL DATA:  Respiratory failure. EXAM: PORTABLE CHEST 1 VIEW COMPARISON:  02/25/2017. FINDINGS: Endotracheal tube, NG tube, left IJ central line, right IJ dialysis catheter stable position. Cardiomegaly. Interim partial clearing of bilateral pulmonary infiltrates/edema with persistent bibasilar edema and/or infiltrates. Small left pleural effusion. No pneumothorax. IMPRESSION: 1. Lines and tubes in stable position. 2. Cardiomegaly with interim partial clearing of bilateral pulmonary infiltrates/edema. Persistent bibasilar infiltrates and or edema. Small left pleural effusion. Electronically Signed   By: Marcello Moores  Register   On: 03/03/2017 06:27   Dg Chest Port 1 View  Result Date: 02/15/2017 CLINICAL DATA:  Knee lead minute patient.  Intubated. EXAM: PORTABLE CHEST 1 VIEW COMPARISON:  Single-view of the chest 01/27/2017. FINDINGS: Endotracheal tube is in place with the tip at the level of clavicular heads well above the carina. NG tube courses into the stomach and below the inferior margin of the film. Left IJ catheter tip projects in the mid to lower superior vena cava. Right IJ approach dialysis catheter tip at projects in the right atrium. There is marked cardiomegaly. Patchy bilateral airspace disease is seen the mid and lower lung zones and appears worse on the comparison exam. There is interstitial edema. No pneumothorax. IMPRESSION: Support tubes and lines projecting good position. Worsened patchy bilateral  airspace disease appears most intense in the bases is worrisome for pneumonia superimposed on edema. Electronically Signed   By: Inge Rise M.D.   On: 02/23/2017 13:18   Dg Abd Portable 1v  Result Date: 03/08/2017 CLINICAL DATA:  Nasogastric tube placement. EXAM: PORTABLE ABDOMEN - 1 VIEW COMPARISON:  Abdominal radiograph January 25, 2017 FINDINGS: Nasogastric tube tip projects in mid stomach, contrast or debris within the proximal stomach. Paucity of bowel gas. Soft tissue planes and included osseous structures are nonsuspicious, large body habitus. IMPRESSION: Nasogastric tube tip projects in mid stomach. Electronically Signed   By: Elon Alas M.D.   On: 03/05/2017 16:21     Medications:   . dextrose 30 mL/hr at  03/03/17 0800  . famotidine (PEPCID) IV Stopped (03/02/17 2224)  . fentaNYL infusion INTRAVENOUS Stopped (02/08/2017 1300)  . linezolid (ZYVOX) IV Stopped (03/03/17 0330)  . meropenem (MERREM) IV Stopped (03/03/17 0245)  . metronidazole Stopped (03/03/17 0325)  . norepinephrine (LEVOPHED) Adult infusion 50.027 mcg/min (03/03/17 0800)  . phenylephrine (NEO-SYNEPHRINE) Adult infusion 400 mcg/min (03/03/17 0800)  . dialysate (PRISMASATE) 2,000 mL/hr at 03/03/17 5009  .  sodium bicarbonate  infusion 1000 mL 75 mL/hr at 03/03/17 0800  . sodium bicarbonate (isotonic) 1000 mL infusion 125 mL/hr at 03/03/17 0055  . sodium bicarbonate (isotonic) 1000 mL infusion 150 mL/hr at 03/02/17 2011  . sodium chloride    . vasopressin (PITRESSIN) infusion - *FOR SHOCK* 0.03 Units/min (03/03/17 0800)   . artificial tears   Both Eyes Q8H  . chlorhexidine gluconate (MEDLINE KIT)  15 mL Mouth Rinse BID  . Chlorhexidine Gluconate Cloth  6 each Topical Daily  . hydrocortisone sod succinate (SOLU-CORTEF) inj  50 mg Intravenous Q6H  . mouth rinse  15 mL Mouth Rinse 10 times per day  . silver sulfADIAZINE   Topical Daily  . sodium chloride flush  10-40 mL Intracatheter Q12H  . vancomycin  500  mg Per Tube Q6H  . vancomycin (VANCOCIN) rectal ENEMA  500 mg Rectal Q6H   dextrose, fentaNYL, heparin, ipratropium-albuterol, sodium chloride, sodium chloride flush  Assessment/ Plan:   End stage renal disease patient usually MWF transferred in septic shock to  from [facility] Labs are pending but with the hemodynamic instabilty and sepsis we shall plan on CRRT . Prognosis is poor. Possible toxic megacolon   Systolic and diastolic heart failure  Pressor dependent shock   Adrenal insufficiency   COPD  Gout   Persistent atrial fibrillation  Rheumatoid arthritis  Patient seen on CRRT  No heparin   Keep even on dialysis  Labs stable no changes  Continue IV bicarbonate for now still acidotic with bicarb of 13  Continue drips . Suspect catastrophic intraabdominal event.    Discussed with Dr Halford Chessman   Ongoing discussions regarding goals of care     LOS: 2 Jacinda Kanady W _0 _1 :28 AM

## 2017-03-04 ENCOUNTER — Inpatient Hospital Stay (HOSPITAL_COMMUNITY): Payer: Medicare Other

## 2017-03-04 DIAGNOSIS — R579 Shock, unspecified: Secondary | ICD-10-CM

## 2017-03-04 LAB — BLOOD GAS, ARTERIAL
ACID-BASE DEFICIT: 20 mmol/L — AB (ref 0.0–2.0)
BICARBONATE: 8.1 mmol/L — AB (ref 20.0–28.0)
DRAWN BY: 270271
FIO2: 50
LHR: 30 {breaths}/min
MECHVT: 550 mL
O2 Saturation: 94.9 %
PEEP/CPAP: 5 cmH2O
PH ART: 7.072 — AB (ref 7.350–7.450)
Patient temperature: 98.6
pCO2 arterial: 29 mmHg — ABNORMAL LOW (ref 32.0–48.0)
pO2, Arterial: 90.6 mmHg (ref 83.0–108.0)

## 2017-03-04 LAB — GLUCOSE, CAPILLARY
GLUCOSE-CAPILLARY: 82 mg/dL (ref 65–99)
GLUCOSE-CAPILLARY: 123 mg/dL — AB (ref 65–99)
Glucose-Capillary: 70 mg/dL (ref 65–99)
Glucose-Capillary: 100 mg/dL — ABNORMAL HIGH (ref 65–99)
Glucose-Capillary: 78 mg/dL (ref 65–99)
Glucose-Capillary: 105 mg/dL — ABNORMAL HIGH (ref 65–99)

## 2017-03-04 LAB — RENAL FUNCTION PANEL
Albumin: <1 g/dL — ABNORMAL LOW (ref 3.5–5.0)
Anion gap: 33 — ABNORMAL HIGH (ref 5–15)
BUN: 9 mg/dL (ref 6–20)
CHLORIDE: 94 mmol/L — AB (ref 101–111)
CO2: 8 mmol/L — AB (ref 22–32)
CREATININE: 1 mg/dL (ref 0.44–1.00)
Calcium: 6.3 mg/dL — CL (ref 8.9–10.3)
Glucose, Bld: 105 mg/dL — ABNORMAL HIGH (ref 65–99)
POTASSIUM: 4.4 mmol/L (ref 3.5–5.1)
Phosphorus: 4.4 mg/dL (ref 2.5–4.6)
Sodium: 135 mmol/L (ref 135–145)

## 2017-03-04 LAB — CBC
HEMATOCRIT: 30.8 % — AB (ref 36.0–46.0)
HEMOGLOBIN: 8.4 g/dL — AB (ref 12.0–15.0)
MCH: 27 pg (ref 26.0–34.0)
MCHC: 27.3 g/dL — ABNORMAL LOW (ref 30.0–36.0)
MCV: 99 fL (ref 78.0–100.0)
Platelets: 38 10*3/uL — ABNORMAL LOW (ref 150–400)
RBC: 3.11 MIL/uL — ABNORMAL LOW (ref 3.87–5.11)
RDW: 31.4 % — AB (ref 11.5–15.5)
WBC: 36.3 10*3/uL — AB (ref 4.0–10.5)

## 2017-03-04 LAB — MAGNESIUM: Magnesium: 2 mg/dL (ref 1.7–2.4)

## 2017-03-05 LAB — CULTURE, RESPIRATORY

## 2017-03-05 LAB — CULTURE, RESPIRATORY W GRAM STAIN

## 2017-03-07 LAB — CULTURE, BLOOD (ROUTINE X 2)
CULTURE: NO GROWTH
SPECIAL REQUESTS: ADEQUATE

## 2017-03-08 ENCOUNTER — Telehealth: Payer: Self-pay | Admitting: Internal Medicine

## 2017-03-09 NOTE — Telephone Encounter (Signed)
Sernenity brought D/c 03/08/17 PWR. Will deliever to pulmonary side A on 2017/03/18 for Dr.Sood to sign Death Cert is ready called funeral home for pickup.PWR

## 2017-03-11 NOTE — Progress Notes (Signed)
Pharmacy Antibiotic Note  Paige Marshall is a 61 y.o. female admitted on 02/25/2017 from [facility] with sepsis and intra-abdominal infection. Also on pr vancomycin and IV metronidazole for Cdiff as well as Linezolid with recent history of VRE bacteremia. Patient has CKD stage 5 on HD at [facility]. Possible CRRT. Patient is on max pressors  Plan: merrem 1 g q12h while on crrt F/u comfort  Height: 5\' 8"  (172.7 cm) Weight: (!) 306 lb 7 oz (139 kg) IBW/kg (Calculated) : 63.9  Temp (24hrs), Avg:97 F (36.1 C), Min:94.4 F (34.7 C), Max:98.6 F (37 C)   Recent Labs Lab 03/05/2017 1329  03/02/17 0400 03/02/17 0842 03/02/17 1602 03/03/17 0428 03/03/17 0430 03/03/17 1500 Mar 05, 2017 0354  WBC 23.3*  --  24.8*  --   --  33.4*  --   --  36.3*  CREATININE  --   < > 2.75*  --  2.01*  --  1.56* 1.26* 1.00  LATICACIDVEN 4.7*  --   --  14.5*  --   --   --   --   --   < > = values in this interval not displayed.  Estimated Creatinine Clearance: 90.9 mL/min (by C-G formula based on SCr of 1 mg/dL).    Allergies  Allergen Reactions  . Penicillins Hives, Itching, Swelling and Other (See Comments)    Tongue swelling Has patient had a PCN reaction causing immediate rash, facial/tongue/throat swelling, SOB or lightheadedness with hypotension: Yes  Clarified with the patient her penicillin allergy. Pt has tolerated cefepime and ampicillin.    . Citrus Hives   Levester Fresh, PharmD, BCPS, BCCCP Clinical Pharmacist Clinical phone for 03/05/17 from 7a-3:30p: [phone] If after 3:30p, please call main pharmacy at: [phone]/03/05 8:46 AM

## 2017-03-11 NOTE — Progress Notes (Signed)
Attempted to contact pts daughter but no answer on phone. Called pts sister and made her aware that despite continuing maxium support pts BP continues to slowly trend down. Stressed the importance that pt continues to decline and that if family wants to be at the bedside now is the time to come to the hospital.  Pts sister stated she would also try calling the pts daughter.  Will continue to monitor and offer support.

## 2017-03-11 NOTE — Progress Notes (Signed)
Attempted calling pts daughter back and was able to talk to her. Spoke to daughter and made her aware that her mother is actively passing. Pts daughter stated "I have said everything I need to her. And after seeing her last night I don't think I will be back to the hospital." Support given over the phone.   This information was relayed to the care team. No other family at bedside.  Marni Griffon, NP made aware. CRRT stopped due to pt actively dying.   Per Dr Halford Chessman all interventions including pressor stopped. Pt given IV Fentanyl for comfort. Will continue to monitor pt for any signs of discomfort.

## 2017-03-11 NOTE — Progress Notes (Signed)
PULMONARY / CRITICAL CARE MEDICINE   Name: Paige Marshall MRN: [id number] DOB: 01/12/1956    ADMISSION DATE:  03/10/2017 CONSULTATION DATE:  02/18/2017  REFERRING MD:  [facility] MD  CHIEF COMPLAINT:  Cdiff  HISTORY OF PRESENT ILLNESS:   61 year old female, [facility] resident, who presented to [facility] on 5/22 with concern for septic shock in the setting of C-Diff.    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  She was recently admitted from 12/26/16 - 02/10/17 for acute on chronic hypoxic/hypercapnic respiratory failure, AFwRVR, subdural hematoma / ICH, VRE bacteremia, adrenal insufficiency.  She was discharged to [facility] on 02/10/17 for further rehab efforts.    On 5/22, she returned to [facility] with worsening acidosis, C-Diff infection with subsequent septic shock.  The patient had not been tolerating regular HD and there was also concern for possible need of HD.  Admit ABG 7.154 / 45.6 / 180 / 16.    SUBJECTIVE:   Refractory shock Pressors topped out Awaiting family.   VITAL SIGNS: BP (!) 30/14 (BP Location: Left Wrist)   Pulse 80   Temp 97.6 F (36.4 C)   Resp (!) 27   Ht 5\' 8"  (1.727 m)   Wt (!) 306 lb 7 oz (139 kg)   LMP 09/07/2011   SpO2 (!) 80%   BMI 46.59 kg/m   HEMODYNAMICS: CVP:  [0 mmHg-15 mmHg] 6 mmHg  VENTILATOR SETTINGS: Vent Mode: PRVC FiO2 (%):  [50 %] 50 % Set Rate:  [30 bmp] 30 bmp Vt Set:  [550 mL] 550 mL PEEP:  [5 cmH20] 5 cmH20 Plateau Pressure:  [19 cmH20-28 cmH20] 21 cmH20  INTAKE / OUTPUT: I/O last 3 completed shifts: In: 13947.8 [I.V.:11687.8; Other:740; NG/GT:120; IV Piggyback:1400] Out: [id number] [Emesis/NG output:200; OEVOJ:50093]  PHYSICAL EXAMINATION: General appearance:  Critically ill appearing 61 Year old  female, well nourished/ cachectic  NAD, currently in acute  distress, confused,  conversant  Eyes: sclerae non-icteric , moist conjunctivae; PERRL, EOMI bilaterally. Mouth:  membranes dry and no mucosal ulcerations; normal hard and soft palate, orally intubated Neck: Trachea midline; neck supple, no JVD Lungs/chest: crackles bases, with normal respiratory effort and no intercostal retractions CV: RRR, no MRGs  Abdomen: Soft, non-tender; no masses or HSM Extremities: diffuse anasarca Skin: cool temperature,  Neuro/Psych: grimaces to palp of abd. Otherwise no response   LABS:  BMET  Recent Labs Lab 03/03/17 0430 03/03/17 1500 Apr 02, 2017 0354  NA 133* 135 135  K 3.5 3.7 4.4  CL 93* 94* 94*  CO2 13* 11* 8*  BUN 16 12 9   CREATININE 1.56* 1.26* 1.00  GLUCOSE 73 71 105*    Electrolytes  Recent Labs Lab 03/02/17 0400  03/03/17 0428 03/03/17 0430 03/03/17 1500 April 02, 2017 0354  CALCIUM 7.0*  < >  --  6.5* 6.5* 6.3*  MG 2.0  --  1.9  --   --  2.0  PHOS 2.6  < >  --  2.8 3.3 4.4  < > = values in this interval not displayed.  CBC  Recent Labs Lab 03/02/17 0400 03/03/17 0428 04/02/17 0354  WBC 24.8* 33.4* 36.3*  HGB 9.1* 9.3* 8.4*  HCT 32.2* 32.1* 30.8*  PLT 85* 60* 38*    Coag's  Recent Labs Lab 02/28/2017 1329  APTT 99*  INR 2.48    Sepsis  Markers  Recent Labs Lab 02/22/2017 1328 02/19/2017 1329 03/02/17 0842  LATICACIDVEN  --  4.7* 14.5*  PROCALCITON 7.12  --   --     ABG  Recent Labs Lab 03/02/17 1851 03/03/17 0320 March 25, 2017 0310  PHART 7.174* 7.246* 7.072*  PCO2ART 34.6 32.9 29.0*  PO2ART 91.0 108 90.6    Liver Enzymes  Recent Labs Lab 03/03/17 0430 03/03/17 1500 Mar 25, 2017 0354  ALBUMIN 1.2* 1.1* <1.0*    Cardiac Enzymes  Recent Labs Lab 03/07/2017 1329 03/02/17 0400  TROPONINI 0.19* 0.07*    Glucose  Recent Labs Lab 03/03/17 2317 Mar 25, 2017 0001 2017/03/25 0111 03-25-17 0204 2017/03/25 0346 03/25/17 0558  GLUCAP 121* 99 82 70 100* 78    Imaging No results found.  STUDIES:     CULTURES: Tracheal Aspirate 5/22 >>  BCx2 5/22 >>  UC 5/22 >>  CDiff 5/22 >>  ANTIBIOTICS: Linezolid 5/22 >>  Meropenem 5/22 >> Flagyl 5/22 >> 5/24 Vanco (oral) 5/22 >> 5/24 Vanco (rectal) 5/22 >>  SIGNIFICANT EVENTS: 5/22 Admit from [facility] with septic shock, C-Diff  5/24 made DNR 5/25 refractory shock.   LINES/TUBES: ETT 5/20 ([facility])>>  L IJ tunneled PICC >> R Fem TLC ([facility]) >> RUE AVF >>     DISCUSSION: 61 y/o F admitted 5/22 from [facility] with septic shock in the setting of C-Diff, HCAP.  Now in refractory shock. Given persistent decline also wonder about gut ischemia. Regardless of cause at this point further escalation would be futile. Will meet w/ family later today. We should transition to comfort.   ASSESSMENT / PLAN:  Refractory Septic shock likely from C diff colitis and HCAP-->MODS Gangrene Lt hand, AV fistula wound. -levo remains at 80 mcgs. Nothing further to add.  -given refractory shock also consider bowel ischemia  Plan Cont current support for now w/ Levo/vaso & Neo Full DNR Cont stress dose steroids  Day 4 Linezolid/meropenem and rectal vanc Will sit down w/ family this afternoon and discuss transition to comfort as we are now entering futile care    Acute on chronic hypoxic/hypercapnic respiratory failure in setting of septic shock and HCAP. Hx of COPD, OSA. Plan Cont full vent support Ve maximized PAD protocol  Hx of non ischemic CM >> EF 30% on echo from 02/10/17. Elevated troponin from demand ischemia. Hx of A fib. Plan Cont tele  Hemodynamics aimed at shock support as above  ESRD. Metabolic acidosis-->progressing in spite of CRRT Lactic acidosis. Plan Cont CRRT  Moderate protein calorie malnutrition. Plan Cont trickle feeds  Anemia of critical illness and chronic disease. Thrombocytopenia in setting of sepsis. ->H&H trending down; PLTs dropping  Plan Trend CBC PAS  Acute metabolic encephalopathy.   DM  with episodes of hypoglycemia. Relative adrenal insufficiency. Plan Cont CBGs-->getting D10 Cont stress dose steroids   DVT prophylaxis - SCDs SUP - Pepcid Nutrition - NPO Goals of care - Now DNR. Sister Paige Marshall, [phone]) updated via phone 5/22. Pt's daughter Paige Marshall [place], [phone]).  My ccm time 35 minutes.  Erick Colace ACNP-BC [place] Pager # [phone] OR # [phone] if no answer

## 2017-03-11 NOTE — Progress Notes (Signed)
Pt pupils fixed and dilated, no pulse or heart beat noted. Time of death 1155. [facility] called. Referral number [phone]. Pt is not a candidate for any donation.   Attempted to call pts daughter, but no answer. Was able to get in contact with pts sister and notified her.

## 2017-03-11 NOTE — Progress Notes (Signed)
Nursing Note: Daughter called to advise of change in status.  Asked to come in.    [facility] called to rule out organs.  Patient was found not suitable for organ donation but I was instructed to call back with cardiac time of death.

## 2017-03-11 NOTE — Progress Notes (Signed)
[facility] ROUNDING NOTE   Subjective:   Interval History:  Patient appears to be having very tenuous blood pressureInterval History: 61 year old female, [facility] resident, who presented to [facility] on 5/22 with concern for septic shock in the setting of CDiff.   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 She was recently admitted from 12/26/16 - 02/10/17 for acute on chronic hypoxic/hypercapnic respiratory failure, AFwRVR, subdural hematoma / ICH, VRE bacteremia, adrenal insufficiency. She was discharged to [facility] on 02/10/17 for further rehab efforts   Family called as patient appears to be deteriorating despite maximal support of hemodynamics   Objective:  Vital signs in last 24 hours:  Temp:  [94.4 F (34.7 C)-98.6 F (37 C)] 97.6 F (36.4 C) (05/25 0400) Pulse Rate:  [80-178] 80 (05/25 0745) Resp:  [19-32] 27 (05/25 0800) BP: (30-139)/(14-59) 30/14 (05/25 0800) SpO2:  [78 %-81 %] 80 % (05/25 0745) Arterial Line BP: (48-112)/(15-40) 88/18 (05/25 0800) FiO2 (%):  [50 %] 50 % (05/25 0800) Weight:  [306 lb 7 oz (139 kg)] 306 lb 7 oz (139 kg) (05/25 0357)  Weight change: 2 lb 10.3 oz (1.2 kg) Filed Weights   03/02/17 0800 03/03/17 0300 03-13-17 0357  Weight: (!) 303 lb 12.7 oz (137.8 kg) (!) 308 lb 6.8 oz (139.9 kg) (!) 306 lb 7 oz (139 kg)    Intake/Output: I/O last 3 completed shifts: In: 13947.8 [I.V.:11687.8; Other:740; NG/GT:120; IV Piggyback:1400] Out: [id number] [Emesis/NG output:200; LNLGX:21194]   Intake/Output this shift:  Total I/O In: 351.3 [I.V.:341.3; Other:10] Out: -   CVS- RRR RS- CTA  Intubated  ABD- BS present soft non-distended EXT-  1+  edema   Basic Metabolic Panel:  Recent Labs Lab 03/07/2017 1328  03/02/17 0400 03/02/17 1602 03/03/17 0428 03/03/17 0430  03/03/17 1500 03/13/17 0354  NA  --   < > 127* 133*  --  133* 135 135  K  --   < > 3.9 3.8  --  3.5 3.7 4.4  CL  --   < > 93* 94*  --  93* 94* 94*  CO2  --   < > 15* 14*  --  13* 11* 8*  GLUCOSE  --   < > 68 60*  --  73 71 105*  BUN  --   < > 34* 22*  --  _0 CREATININE  --   < > 2.75* 2.01*  --  1.56* 1.26* 1.00  CALCIUM  --   < > 7.0* 6.9*  --  6.5* 6.5* 6.3*  MG 0.8*  --  2.0  --  1.9  --   --  2.0  PHOS 1.6*  < > 2.6 2.9  --  2.8 3.3 4.4  < > = values in this interval not displayed.  Liver Function Tests:  Recent Labs Lab 03/02/17 0400 03/02/17 1602 03/03/17 0430 03/03/17 1500 03/13/17 0354  ALBUMIN 1.2* 1.2* 1.2* 1.1* <1.0*    Recent Labs Lab 02/17/2017 1329  LIPASE 15  AMYLASE 68   No results for input(s): AMMONIA in the last 168 hours.  CBC:  Recent Labs Lab 03/05/2017 1329 03/02/17 0400 03/03/17 0428 2017/03/13 0354  WBC 23.3* 24.8* 33.4* 36.3*  HGB 8.8* 9.1* 9.3* 8.4*  HCT 30.3* 32.2* 32.1* 30.8*  MCV 94.7 92.0 94.1 99.0  PLT 84* 85* 60* 38*    Cardiac Enzymes:  Recent Labs Lab 03/09/2017 1329 03/02/17 0400  TROPONINI 0.19* 0.07*    BNP: Invalid input(s): POCBNP  CBG:  Recent Labs Lab Apr 01, 2017 0001 04-01-2017 0111 2017-04-01 0204 April 01, 2017 0346 04/01/2017 0558  GLUCAP 99 82 70 100* 33    Microbiology: Results for orders placed or performed during the hospital encounter of 03/08/2017  MRSA PCR Screening     Status: None   Collection Time: 02/23/2017 12:37 PM  Result Value Ref Range Status   MRSA by PCR NEGATIVE NEGATIVE Final    Comment:        The GeneXpert MRSA Assay (FDA approved for NASAL specimens only), is one component of a comprehensive MRSA colonization surveillance program. It is not intended to diagnose MRSA infection nor to guide or monitor treatment for MRSA infections.   Culture, respiratory (tracheal aspirate)     Status: None (Preliminary result)   Collection Time: 02/16/2017  1:00 PM  Result Value Ref Range Status    Specimen Description TRACHEAL ASPIRATE  Final   Special Requests NONE  Final   Gram Stain   Final    MODERATE WBC PRESENT,BOTH PMN AND MONONUCLEAR ABUNDANT GRAM VARIABLE ROD    Culture CULTURE REINCUBATED FOR BETTER GROWTH  Final   Report Status PENDING  Incomplete  Culture, blood (routine x 2)     Status: None (Preliminary result)   Collection Time: 02/11/2017 11:55 PM  Result Value Ref Range Status   Specimen Description BLOOD LEFT WRIST  Final   Special Requests IN PEDIATRIC BOTTLE Blood Culture adequate volume  Final   Culture NO GROWTH 1 DAY  Final   Report Status PENDING  Incomplete    Coagulation Studies:  Recent Labs  02/25/2017 1329  LABPROT 27.3*  INR 2.48    Urinalysis: No results for input(s): COLORURINE, LABSPEC, PHURINE, GLUCOSEU, HGBUR, BILIRUBINUR, KETONESUR, PROTEINUR, UROBILINOGEN, NITRITE, LEUKOCYTESUR in the last 72 hours.  Invalid input(s): APPERANCEUR    Imaging: Dg Chest Port 1 View  Result Date: 03/03/2017 CLINICAL DATA:  Respiratory failure. EXAM: PORTABLE CHEST 1 VIEW COMPARISON:  03/03/2017. FINDINGS: Endotracheal tube, NG tube, left IJ central line, right IJ dialysis catheter stable position. Cardiomegaly. Interim partial clearing of bilateral pulmonary infiltrates/edema with persistent bibasilar edema and/or infiltrates. Small left pleural effusion. No pneumothorax. IMPRESSION: 1. Lines and tubes in stable position. 2. Cardiomegaly with interim partial clearing of bilateral pulmonary infiltrates/edema. Persistent bibasilar infiltrates and or edema. Small left pleural effusion. Electronically Signed   By: Marcello Moores  Register   On: 03/03/2017 06:27     Medications:   . dextrose 30 mL/hr at 2017/04/01 0800  . famotidine (PEPCID) IV Stopped (03/03/17 2156)  . linezolid (ZYVOX) IV Stopped (April 01, 2017 0349)  . meropenem (MERREM) IV Stopped (04/01/17 0229)  . norepinephrine (LEVOPHED) Adult infusion 80 mcg/min (04-01-17 0800)  . phenylephrine  (NEO-SYNEPHRINE) Adult infusion 400 mcg/min (2017-04-01 0800)  . dialysate (PRISMASATE) 2,000 mL/hr at 04/01/2017 0725  .  sodium bicarbonate  infusion 1000 mL 75 mL/hr at 04/01/17 0800  . sodium bicarbonate (isotonic) 1000 mL infusion 125 mL/hr at 03/03/17 1638  . sodium bicarbonate (isotonic) 1000 mL infusion 150 mL/hr at 03/03/17 2309  . sodium chloride    . vasopressin (PITRESSIN) infusion - *FOR SHOCK* 0.03 Units/min (Apr 01, 2017 0800)   . artificial tears   Both Eyes Q8H  . chlorhexidine gluconate (MEDLINE KIT)  15 mL Mouth Rinse BID  . Chlorhexidine Gluconate Cloth  6 each Topical Daily  .  feeding supplement (VITAL HIGH PROTEIN)  1,000 mL Per Tube Q24H  . hydrocortisone sod succinate (SOLU-CORTEF) inj  50 mg Intravenous Q6H  . mouth rinse  15 mL Mouth Rinse 10 times per day  . silver sulfADIAZINE   Topical Daily  . sodium chloride flush  10-40 mL Intracatheter Q12H  . vancomycin (VANCOCIN) rectal ENEMA  500 mg Rectal Q6H   dextrose, fentaNYL (SUBLIMAZE) injection, heparin, ipratropium-albuterol, sodium chloride, sodium chloride flush  Assessment/ Plan:   End stage renal disease patient usually MWF transferred in septic shock to [place] from [facility] Labs are pending but with the hemodynamic instabilty and sepsis we shall plan on CRRT . Prognosis is poor. Possible toxic megacolon   Systolic and diastolic heart failure  Pressor dependent shock   Adrenal insufficiency   COPD  Gout   Persistent atrial fibrillation  Rheumatoid arthritis  Patient seen on CRRT No heparin Keep even on dialysis  Labs stable no changes Continue IV bicarbonate for now still acidotic with bicarb of  9  Continue drips . Suspect catastrophic intraabdominal event.    Unfortunately appears to be worse this morning - family called and asked to present to bedside        LOS: 3 Haskell Rihn W _0 _1 :21 AM

## 2017-03-11 NOTE — Progress Notes (Signed)
   22-Mar-2017 1310  Clinical Encounter Type  Visited With Patient and family together  Visit Type Death  Spiritual Encounters  Spiritual Needs Emotional  Stress Factors  Patient Stress Factors Major life changes  Family Stress Factors Family relationships  Introduction to family. Family declined chaplain services.

## 2017-03-11 NOTE — Progress Notes (Signed)
BP now in 40s.  Nothing to add. Given the fact that she is actively dying we will stop CRRT.   Erick Colace ACNP-BC [place] Pager # [phone] OR # [phone] if no answer

## 2017-03-11 DEATH — deceased

## 2017-03-14 NOTE — Addendum Note (Signed)
Addendum  created 03/14/17 1315 by Myrtie Soman, MD   Sign clinical note

## 2017-03-14 NOTE — Anesthesia Postprocedure Evaluation (Signed)
Anesthesia Post Note  Patient: Paige Marshall  Procedure(s) Performed: Procedure(s) (LRB): CARDIOVERSION (N/A)     Anesthesia Post Evaluation  Last Vitals:  Vitals:   02/10/17 0900 02/10/17 1542  BP: 91/67 (!) 79/57  Pulse: (!) 120 (!) 107  Resp: (!) 26 (!) 24  Temp: 36.6 C 37.1 C    Last Pain:  Vitals:   02/10/17 1542  TempSrc: Oral  PainSc:                  Kynsli Haapala S

## 2017-03-22 ENCOUNTER — Ambulatory Visit: Payer: Medicare Other | Admitting: Cardiovascular Disease

## 2017-04-10 NOTE — Discharge Summary (Signed)
Paige Marshall was a 61 y.o. female former smoker admitted on 02/10/2017 from [facility] for septic shock with C diff colitis and HCAP.  She was at [facility] after having subdural hematoma and intracerebral hemorrhage.  She had tracheostomy, chronic vent, and ESRD on HD.  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  She was started on pressors and had antibiotics adjusted.  She was continued on full vent support.  Nephrology was consulted and she was started on CRRT.  She continued to get worse.  D/w family and made DNR.  Her acidosis and hemodynamics continued to get worse.  She subsequently expired on 03-22-17 at 1155.  Final diagnoses: Septic shock C diff colitis HCAP with Morganella morganii and Enterobacter species Acute on chronic hypoxic, hypercapnic respiratory failure Tracheostomy status ESRD on HD Acute on chronic combined CHF History of atrial fibrillation History of COPD History of rheumatoid arthritis History of CVA, SDH, ICH Metabolic acidosis Lactic acidosis Non ischemic cardiomyopathy with chronic systolic CHF Elevated troponin from demand ischemia Moderate protein calorie malnutrition Anemia of critical illness and chronic disease Thrombocytopenia from sepsis Diabetes mellitus type II Hypoglycemia Relative adrenal insufficiency  Chesley Mires, MD [place] 03/15/2017, 1:38 PM

## 2017-12-08 IMAGING — MR MR MRV HEAD W/O CM
3 series · 20 of 48 positions shown · non-contrast
Comparison: MRI brain 01/21/2017.  MRA head from the same date.

CLINICAL DATA: Extra-axial hemorrhage following recent
cardioversion.

EXAM:
MR VENOGRAM the HEAD WITHOUT CONTRAST
TECHNIQUE: Angiographic images of the intracranial venous structures were
obtained using MRV technique without intravenous contrast.

[Series 4: MRV · coronal · 1.5mm · 0.47mm/px · 11 of 130 slices shown]
[im 7/130]
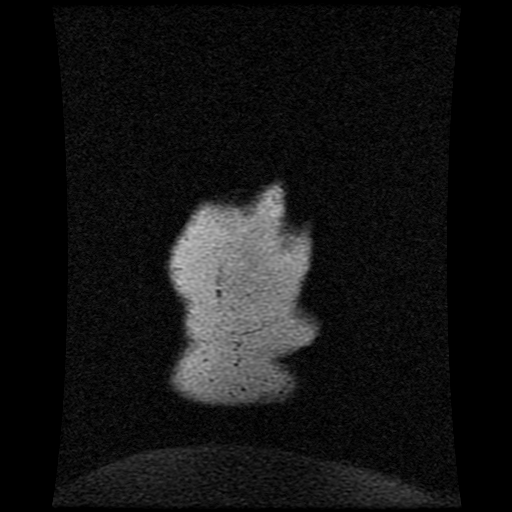
[im 19/130]
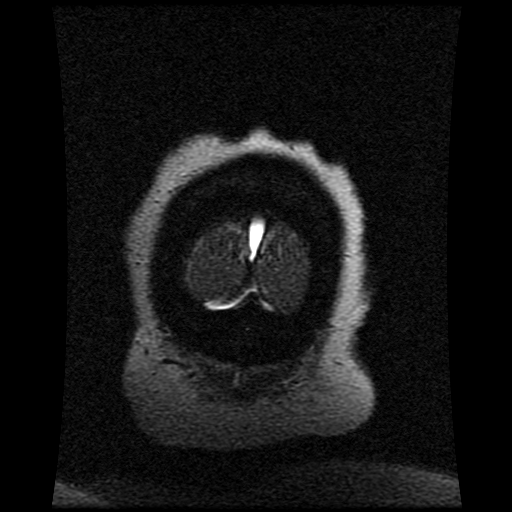
[im 25/130]
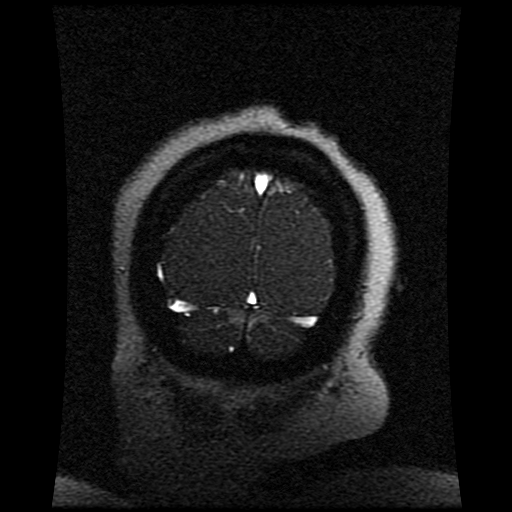
[im 37/130]
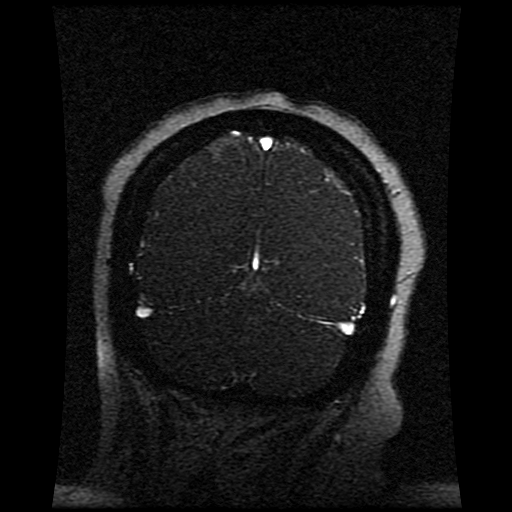
[im 56/130]
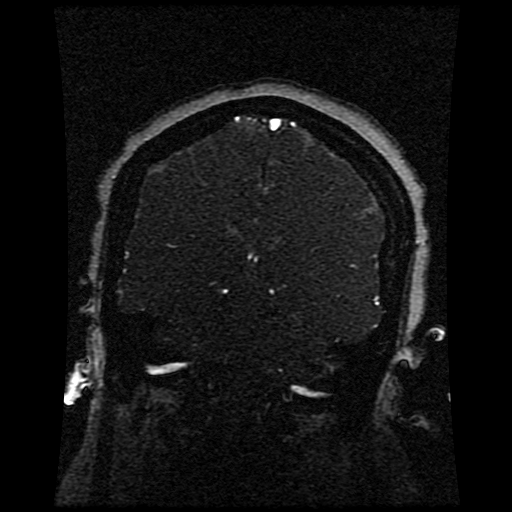
[im 68/130]
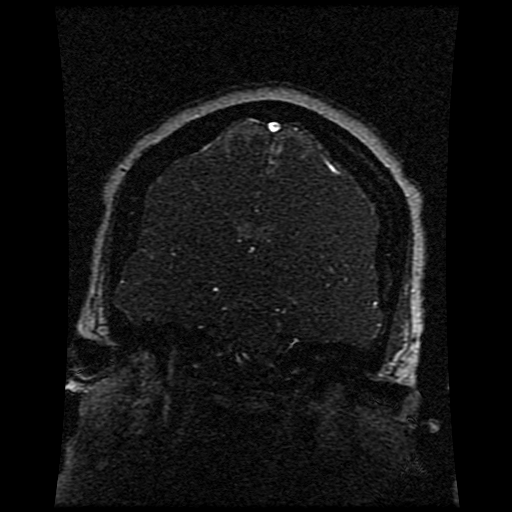
[im 74/130]
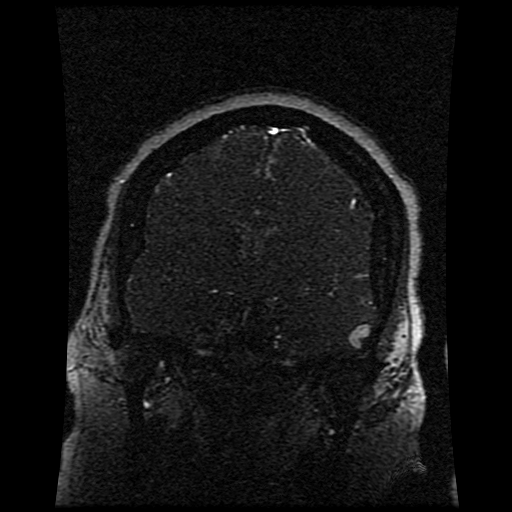
[im 93/130]
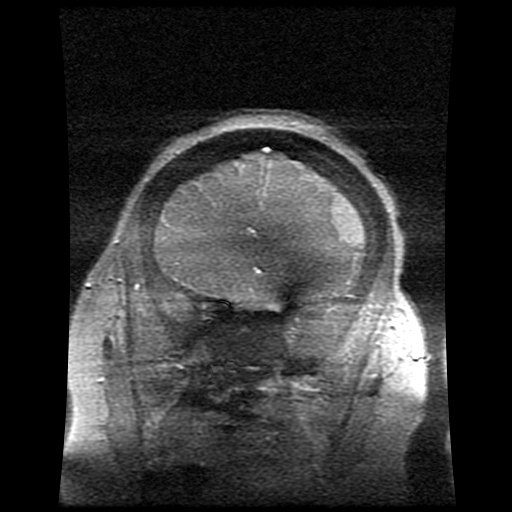
[im 105/130]
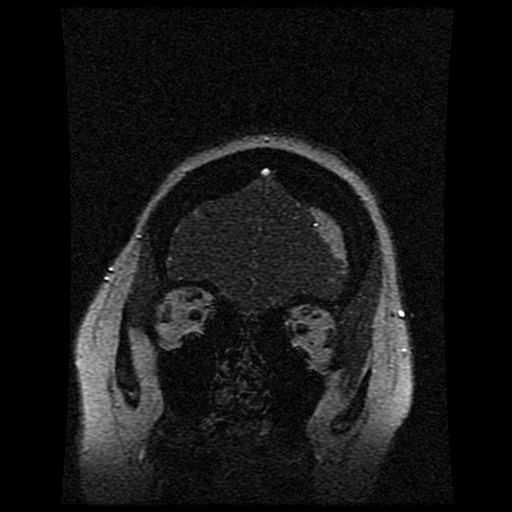
[im 111/130]
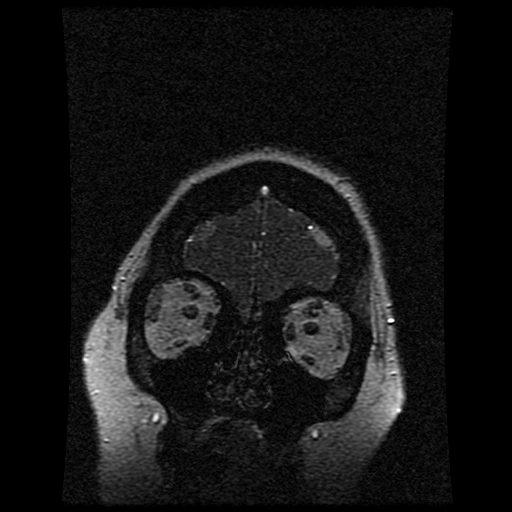
[im 123/130]
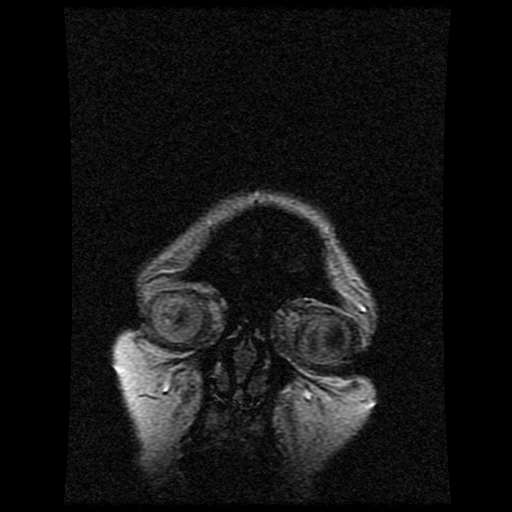

[Series 5: (id) mt fs · axial · 1.4mm · 0.45mm/px · z∈[-73,+6]mm · 8 of 146 slices shown]
[im 7/146]
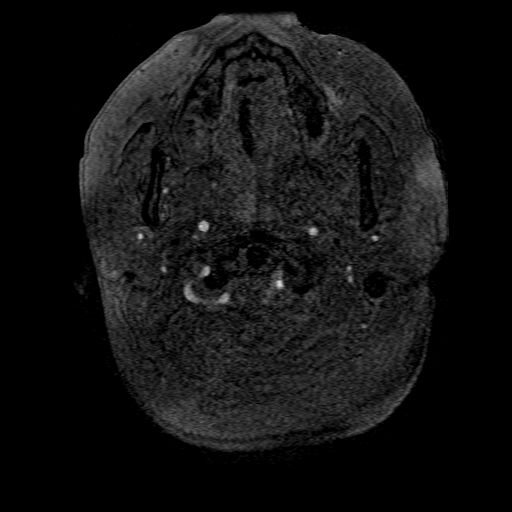
[im 25/146]
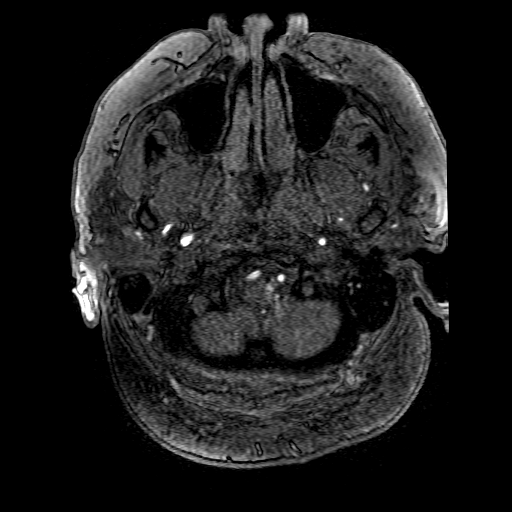
[im 43/146]
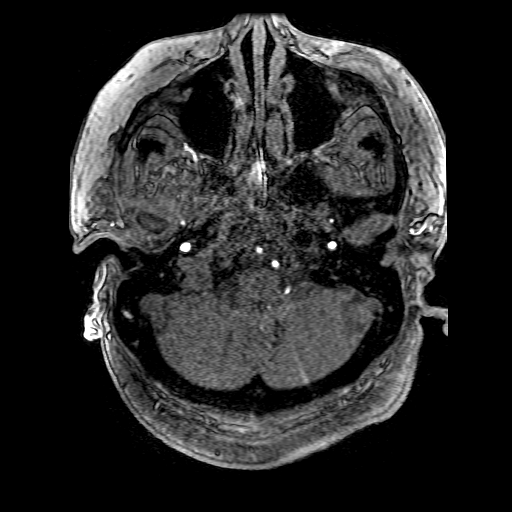
[im 61/146]
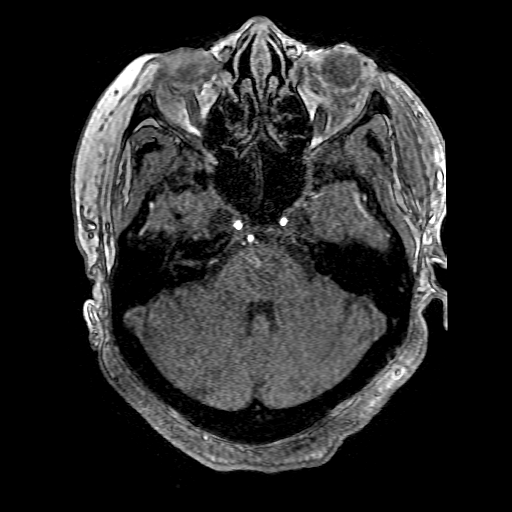
[im 73/146]
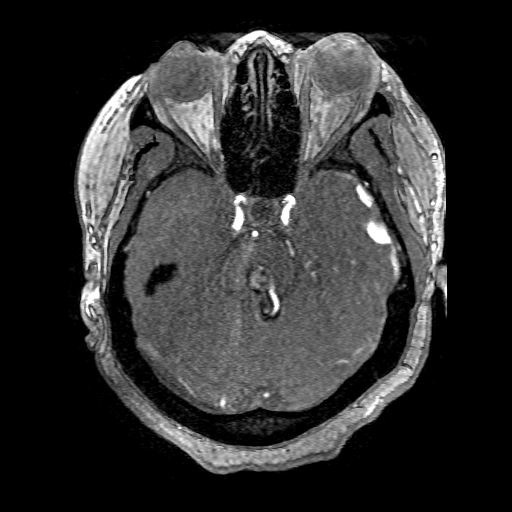
[im 85/146]
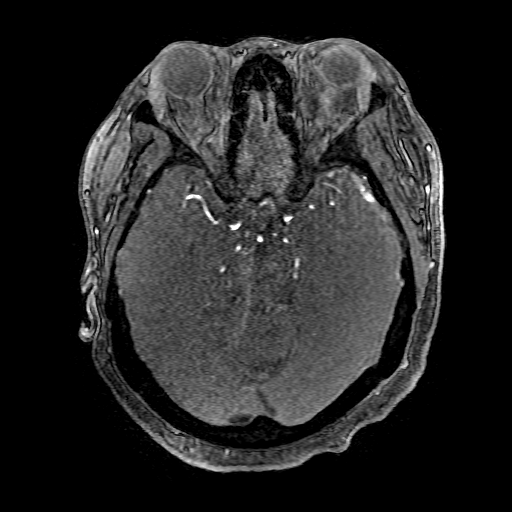
[im 103/146]
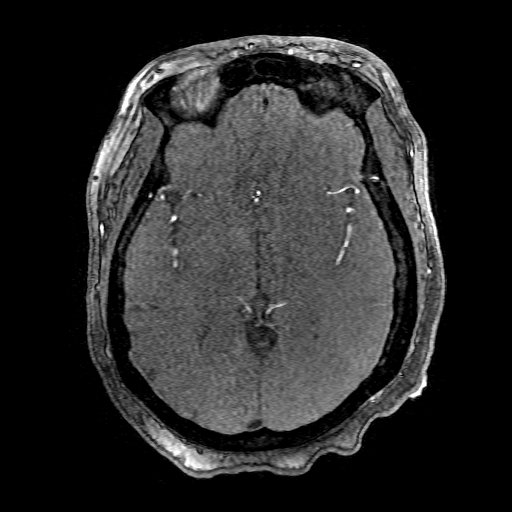
[im 121/146]
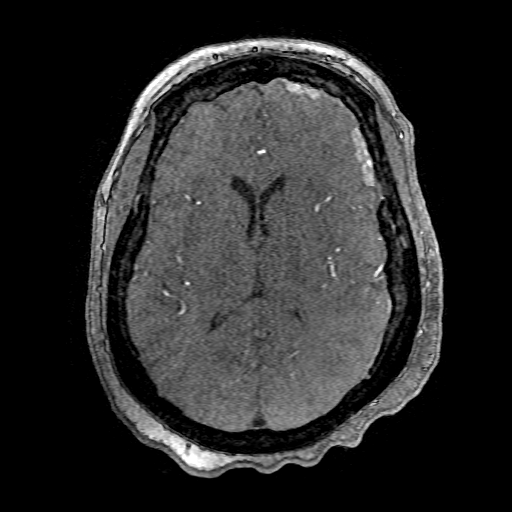

[Series 403: processed images · axial · 1.5mm · 0.47mm/px · 1 of 3 slices shown]
[im 1/3]
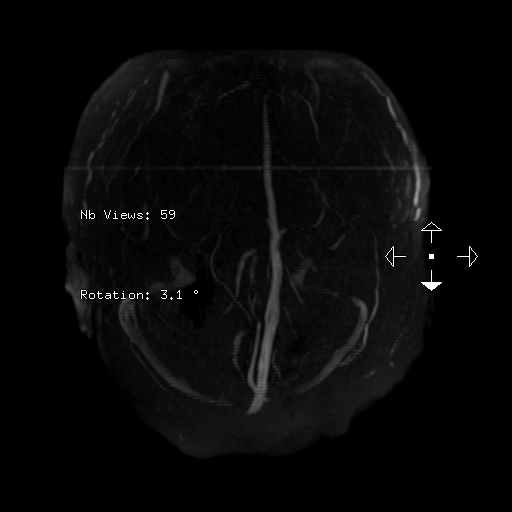

[20 of 48 positions shown; findings below may reference images not displayed]

FINDINGS: The superior sagittal sinus is patent. The straight sinus and deep
cerebral veins are patent. The transverse and sigmoid sinuses are
codominant and patent bilaterally. The cortical veins are
unremarkable.

The source images demonstrates a stable appearance of the
extra-axial hemorrhages.
IMPRESSION: Negative MR venogram.

## 2017-12-08 IMAGING — CR DG CHEST 1V PORT
1 series · 1 of 1 positions shown · non-contrast
Comparison: 01/21/2017

CLINICAL DATA: Ventilator dependent

EXAM:
PORTABLE CHEST 1 VIEW

[AP]
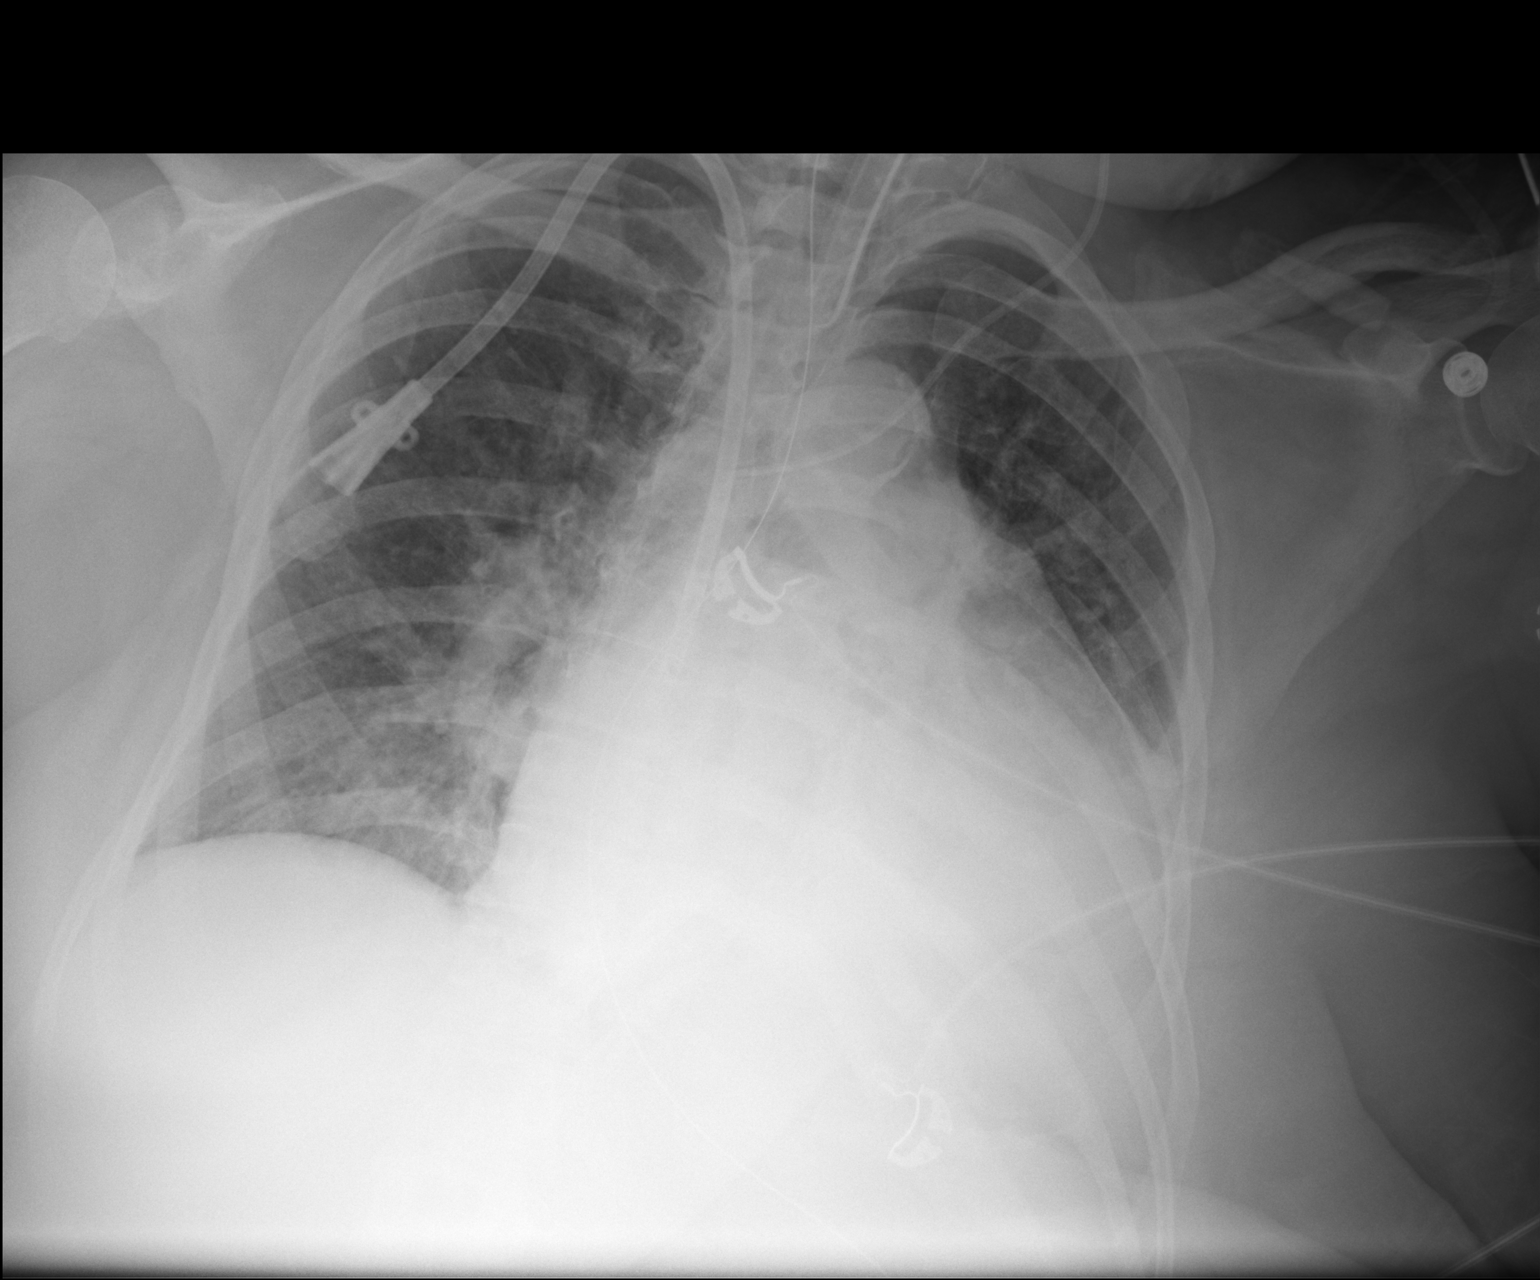

[1 of 1 positions shown; findings below may reference images not displayed]

FINDINGS: Endotracheal tube, central venous lines and NG tube are unchanged.
Stable enlarged cardiac silhouette. Mild central venous congestion
similar prior. LEFT basilar atelectasis is increased.
IMPRESSION: 1. Increase in LEFT basilar atelectasis.
2. Central venous pulmonary congestion similar prior.
3. Stable support apparatus.
